# Patient Record
Sex: Male | Born: 1945 | Race: White | Hispanic: No | Marital: Married | Smoking: Never smoker
Health system: Southern US, Community
[De-identification: ages and names within clinical notes are randomized; demographics above are authoritative.]

## PROBLEM LIST (undated history)

## (undated) DIAGNOSIS — M170 Bilateral primary osteoarthritis of knee: Secondary | ICD-10-CM

## (undated) DIAGNOSIS — H6980 Other specified disorders of Eustachian tube, unspecified ear: Secondary | ICD-10-CM

## (undated) DIAGNOSIS — Z86718 Personal history of other venous thrombosis and embolism: Secondary | ICD-10-CM

## (undated) DIAGNOSIS — K210 Gastro-esophageal reflux disease with esophagitis: Secondary | ICD-10-CM

## (undated) DIAGNOSIS — G8929 Other chronic pain: Secondary | ICD-10-CM

## (undated) DIAGNOSIS — I10 Essential (primary) hypertension: Secondary | ICD-10-CM

## (undated) DIAGNOSIS — Z932 Ileostomy status: Secondary | ICD-10-CM

## (undated) DIAGNOSIS — M1990 Unspecified osteoarthritis, unspecified site: Secondary | ICD-10-CM

## (undated) DIAGNOSIS — I82409 Acute embolism and thrombosis of unspecified deep veins of unspecified lower extremity: Secondary | ICD-10-CM

## (undated) DIAGNOSIS — F419 Anxiety disorder, unspecified: Secondary | ICD-10-CM

## (undated) DIAGNOSIS — K435 Parastomal hernia without obstruction or  gangrene: Secondary | ICD-10-CM

## (undated) DIAGNOSIS — J9610 Chronic respiratory failure, unspecified whether with hypoxia or hypercapnia: Secondary | ICD-10-CM

## (undated) DIAGNOSIS — M7581 Other shoulder lesions, right shoulder: Secondary | ICD-10-CM

## (undated) DIAGNOSIS — G4730 Sleep apnea, unspecified: Secondary | ICD-10-CM

## (undated) DIAGNOSIS — N401 Enlarged prostate with lower urinary tract symptoms: Secondary | ICD-10-CM

## (undated) DIAGNOSIS — I5042 Chronic combined systolic (congestive) and diastolic (congestive) heart failure: Secondary | ICD-10-CM

## (undated) DIAGNOSIS — I255 Ischemic cardiomyopathy: Secondary | ICD-10-CM

## (undated) DIAGNOSIS — F4320 Adjustment disorder, unspecified: Secondary | ICD-10-CM

## (undated) DIAGNOSIS — R5381 Other malaise: Secondary | ICD-10-CM

## (undated) DIAGNOSIS — I219 Acute myocardial infarction, unspecified: Secondary | ICD-10-CM

## (undated) DIAGNOSIS — N138 Other obstructive and reflux uropathy: Secondary | ICD-10-CM

## (undated) DIAGNOSIS — Z951 Presence of aortocoronary bypass graft: Secondary | ICD-10-CM

## (undated) DIAGNOSIS — R7301 Impaired fasting glucose: Secondary | ICD-10-CM

## (undated) DIAGNOSIS — H6990 Unspecified Eustachian tube disorder, unspecified ear: Secondary | ICD-10-CM

## (undated) DIAGNOSIS — Z7901 Long term (current) use of anticoagulants: Secondary | ICD-10-CM

## (undated) DIAGNOSIS — Z789 Other specified health status: Secondary | ICD-10-CM

## (undated) DIAGNOSIS — L8990 Pressure ulcer of unspecified site, unspecified stage: Secondary | ICD-10-CM

## (undated) DIAGNOSIS — H409 Unspecified glaucoma: Secondary | ICD-10-CM

## (undated) DIAGNOSIS — I509 Heart failure, unspecified: Secondary | ICD-10-CM

## (undated) DIAGNOSIS — N529 Male erectile dysfunction, unspecified: Secondary | ICD-10-CM

## (undated) DIAGNOSIS — M479 Spondylosis, unspecified: Secondary | ICD-10-CM

## (undated) DIAGNOSIS — I252 Old myocardial infarction: Secondary | ICD-10-CM

## (undated) DIAGNOSIS — M67439 Ganglion, unspecified wrist: Secondary | ICD-10-CM

## (undated) DIAGNOSIS — K219 Gastro-esophageal reflux disease without esophagitis: Secondary | ICD-10-CM

## (undated) DIAGNOSIS — D638 Anemia in other chronic diseases classified elsewhere: Secondary | ICD-10-CM

## (undated) DIAGNOSIS — M545 Low back pain: Secondary | ICD-10-CM

## (undated) DIAGNOSIS — I2510 Atherosclerotic heart disease of native coronary artery without angina pectoris: Secondary | ICD-10-CM

## (undated) HISTORY — DX: Bilateral primary osteoarthritis of knee: M17.0

## (undated) HISTORY — DX: Adjustment disorder, unspecified: F43.20

## (undated) HISTORY — PX: EYE SURGERY: SHX253

## (undated) HISTORY — DX: Unspecified osteoarthritis, unspecified site: M19.90

## (undated) HISTORY — DX: Chronic respiratory failure, unspecified whether with hypoxia or hypercapnia: J96.10

## (undated) HISTORY — DX: Other specified disorders of Eustachian tube, unspecified ear: H69.80

## (undated) HISTORY — DX: Parastomal hernia without obstruction or gangrene: K43.5

## (undated) HISTORY — DX: Other obstructive and reflux uropathy: N13.8

## (undated) HISTORY — DX: Gastro-esophageal reflux disease without esophagitis: K21.9

## (undated) HISTORY — DX: Low back pain: M54.5

## (undated) HISTORY — DX: Other malaise: R53.81

## (undated) HISTORY — DX: Male erectile dysfunction, unspecified: N52.9

## (undated) HISTORY — DX: Pressure ulcer of unspecified site, unspecified stage: L89.90

## (undated) HISTORY — PX: HERNIA REPAIR: SHX51

## (undated) HISTORY — DX: Personal history of other venous thrombosis and embolism: Z86.718

## (undated) HISTORY — DX: Long term (current) use of anticoagulants: Z79.01

## (undated) HISTORY — DX: Ischemic cardiomyopathy: I25.5

## (undated) HISTORY — DX: Chronic combined systolic (congestive) and diastolic (congestive) heart failure: I50.42

## (undated) HISTORY — PX: WISDOM TOOTH EXTRACTION: SHX21

## (undated) HISTORY — DX: Ganglion, unspecified wrist: M67.439

## (undated) HISTORY — DX: Ileostomy status: Z93.2

## (undated) HISTORY — DX: Impaired fasting glucose: R73.01

## (undated) HISTORY — DX: Other specified health status: Z78.9

## (undated) HISTORY — DX: Unspecified eustachian tube disorder, unspecified ear: H69.90

## (undated) HISTORY — DX: Presence of aortocoronary bypass graft: Z95.1

## (undated) HISTORY — DX: Benign prostatic hyperplasia with lower urinary tract symptoms: N40.1

## (undated) HISTORY — PX: FASCIOTOMY FOOT / TOE: SUR603

## (undated) HISTORY — DX: Gastro-esophageal reflux disease with esophagitis: K21.0

## (undated) HISTORY — DX: Old myocardial infarction: I25.2

## (undated) HISTORY — DX: Other shoulder lesions, right shoulder: M75.81

## (undated) HISTORY — DX: Other chronic pain: G89.29

## (undated) HISTORY — DX: Unspecified glaucoma: H40.9

## (undated) HISTORY — DX: Morbid (severe) obesity due to excess calories: E66.01

## (undated) HISTORY — DX: Atherosclerotic heart disease of native coronary artery without angina pectoris: I25.10

## (undated) HISTORY — DX: Spondylosis, unspecified: M47.9

## (undated) HISTORY — DX: Essential (primary) hypertension: I10

## (undated) HISTORY — DX: Anemia in other chronic diseases classified elsewhere: D63.8

## (undated) HISTORY — DX: Acute embolism and thrombosis of unspecified deep veins of unspecified lower extremity: I82.409

---

## 1998-04-26 ENCOUNTER — Emergency Department (HOSPITAL_COMMUNITY): Admission: EM | Admit: 1998-04-26 | Discharge: 1998-04-26 | Payer: Self-pay | Admitting: Emergency Medicine

## 1999-01-12 ENCOUNTER — Ambulatory Visit: Admission: RE | Admit: 1999-01-12 | Discharge: 1999-01-12 | Payer: Self-pay | Admitting: Pulmonary Disease

## 1999-02-14 ENCOUNTER — Ambulatory Visit: Admission: RE | Admit: 1999-02-14 | Discharge: 1999-02-14 | Payer: Self-pay | Admitting: Pulmonary Disease

## 1999-10-22 ENCOUNTER — Ambulatory Visit (HOSPITAL_COMMUNITY): Admission: RE | Admit: 1999-10-22 | Discharge: 1999-10-22 | Payer: Self-pay | Admitting: Internal Medicine

## 2000-01-13 ENCOUNTER — Encounter: Payer: Self-pay | Admitting: Specialist

## 2000-01-13 ENCOUNTER — Ambulatory Visit (HOSPITAL_COMMUNITY): Admission: RE | Admit: 2000-01-13 | Discharge: 2000-01-13 | Payer: Self-pay | Admitting: Specialist

## 2000-01-25 ENCOUNTER — Encounter: Payer: Self-pay | Admitting: Internal Medicine

## 2000-01-25 ENCOUNTER — Ambulatory Visit (HOSPITAL_COMMUNITY): Admission: RE | Admit: 2000-01-25 | Discharge: 2000-01-25 | Payer: Self-pay | Admitting: Internal Medicine

## 2000-02-01 ENCOUNTER — Ambulatory Visit (HOSPITAL_COMMUNITY): Admission: RE | Admit: 2000-02-01 | Discharge: 2000-02-01 | Payer: Self-pay | Admitting: Specialist

## 2000-02-17 ENCOUNTER — Encounter: Payer: Self-pay | Admitting: Specialist

## 2000-02-17 ENCOUNTER — Ambulatory Visit (HOSPITAL_COMMUNITY): Admission: RE | Admit: 2000-02-17 | Discharge: 2000-02-17 | Payer: Self-pay | Admitting: Specialist

## 2000-03-03 ENCOUNTER — Ambulatory Visit (HOSPITAL_COMMUNITY): Admission: RE | Admit: 2000-03-03 | Discharge: 2000-03-03 | Payer: Self-pay | Admitting: Specialist

## 2000-03-03 ENCOUNTER — Encounter: Payer: Self-pay | Admitting: Specialist

## 2003-08-20 ENCOUNTER — Ambulatory Visit (HOSPITAL_COMMUNITY): Admission: RE | Admit: 2003-08-20 | Discharge: 2003-08-20 | Payer: Self-pay | Admitting: Internal Medicine

## 2013-09-04 DIAGNOSIS — I10 Essential (primary) hypertension: Secondary | ICD-10-CM

## 2013-09-04 HISTORY — DX: Essential (primary) hypertension: I10

## 2013-11-19 ENCOUNTER — Other Ambulatory Visit: Payer: Self-pay | Admitting: Orthopedic Surgery

## 2013-11-22 ENCOUNTER — Encounter (HOSPITAL_BASED_OUTPATIENT_CLINIC_OR_DEPARTMENT_OTHER): Payer: Self-pay | Admitting: *Deleted

## 2013-11-28 ENCOUNTER — Ambulatory Visit (HOSPITAL_BASED_OUTPATIENT_CLINIC_OR_DEPARTMENT_OTHER): Admission: RE | Admit: 2013-11-28 | Payer: Medicare Other | Source: Ambulatory Visit | Admitting: Orthopedic Surgery

## 2013-11-28 HISTORY — DX: Anxiety disorder, unspecified: F41.9

## 2013-11-28 HISTORY — DX: Essential (primary) hypertension: I10

## 2013-11-28 HISTORY — DX: Sleep apnea, unspecified: G47.30

## 2013-11-28 HISTORY — DX: Unspecified osteoarthritis, unspecified site: M19.90

## 2013-11-28 SURGERY — EXCISION MASS
Anesthesia: General | Laterality: Left

## 2013-12-04 DIAGNOSIS — R7301 Impaired fasting glucose: Secondary | ICD-10-CM

## 2013-12-04 HISTORY — DX: Impaired fasting glucose: R73.01

## 2013-12-05 DIAGNOSIS — G8929 Other chronic pain: Secondary | ICD-10-CM | POA: Insufficient documentation

## 2013-12-05 DIAGNOSIS — M170 Bilateral primary osteoarthritis of knee: Secondary | ICD-10-CM

## 2013-12-05 DIAGNOSIS — M479 Spondylosis, unspecified: Secondary | ICD-10-CM

## 2013-12-05 DIAGNOSIS — M545 Low back pain, unspecified: Secondary | ICD-10-CM

## 2013-12-05 HISTORY — DX: Low back pain, unspecified: M54.50

## 2013-12-05 HISTORY — DX: Other chronic pain: G89.29

## 2013-12-05 HISTORY — DX: Bilateral primary osteoarthritis of knee: M17.0

## 2013-12-05 HISTORY — DX: Spondylosis, unspecified: M47.9

## 2014-01-17 ENCOUNTER — Ambulatory Visit (INDEPENDENT_AMBULATORY_CARE_PROVIDER_SITE_OTHER): Payer: Medicare Other | Admitting: Podiatrist

## 2014-01-17 ENCOUNTER — Encounter: Payer: Self-pay | Admitting: Podiatrist

## 2014-01-17 VITALS — BP 111/62 | HR 50 | Resp 18

## 2014-01-17 DIAGNOSIS — L851 Acquired keratosis [keratoderma] palmaris et plantaris: Secondary | ICD-10-CM

## 2014-01-17 DIAGNOSIS — M216X9 Other acquired deformities of unspecified foot: Secondary | ICD-10-CM

## 2014-01-17 NOTE — Progress Notes (Signed)
   Subjective:    Patient ID: Jonathan Le, male    DOB: 11/29/1945, 68 y.o.   MRN: [id number]  HPI I have some hard places on the bottom of my right foot on the heel and been going on for about 2 weeks and hurts when I walk and I have some places on my left foot toward the sides    Review of Systems  Musculoskeletal: Positive for back pain.       Joint pain  All other systems reviewed and are negative.       Objective:   Physical Exam Vascular status intact with pedal pulses palpable at 2/4 dp and pt bilateral.  Neurological sensation intact protectively bilateral.  Prominent plantar flexed metatarsals are present bilateral with intractable hyperkeratotic lesions overlying on bilateral feet.  They have a ground glass appearance with intact integument underlying.       Assessment & Plan:  Prominent metatarsals, porokeratotic lesions  Plan:  Debrided the lesions, packed with salinocaine and applied moleskin.  Gave instructions for home care of the lesions and he will call if any problems arise or if further debridement is necessary.

## 2014-01-17 NOTE — Patient Instructions (Signed)
Purchase wart remover acid and when the areas become tender or sore dab a tiny amount of the acid in the middle of the lesion-- do this for 4 days.  Then soak your foot and the lesion should pull on out.  If it does not, let me know.  Be sure to watch for any redness or drainage around the lesions and if you see any of these signs, do not use the medication

## 2014-03-05 DIAGNOSIS — N529 Male erectile dysfunction, unspecified: Secondary | ICD-10-CM | POA: Insufficient documentation

## 2014-03-06 DIAGNOSIS — M67439 Ganglion, unspecified wrist: Secondary | ICD-10-CM

## 2014-03-06 DIAGNOSIS — H6980 Other specified disorders of Eustachian tube, unspecified ear: Secondary | ICD-10-CM | POA: Insufficient documentation

## 2014-03-06 DIAGNOSIS — F4320 Adjustment disorder, unspecified: Secondary | ICD-10-CM | POA: Insufficient documentation

## 2014-03-06 HISTORY — DX: Ganglion, unspecified wrist: M67.439

## 2014-03-14 DIAGNOSIS — Z789 Other specified health status: Secondary | ICD-10-CM

## 2014-03-14 DIAGNOSIS — IMO0001 Reserved for inherently not codable concepts without codable children: Secondary | ICD-10-CM

## 2014-03-14 HISTORY — DX: Reserved for inherently not codable concepts without codable children: IMO0001

## 2014-05-02 ENCOUNTER — Ambulatory Visit (INDEPENDENT_AMBULATORY_CARE_PROVIDER_SITE_OTHER): Payer: Medicare Other | Admitting: Podiatrist

## 2014-05-02 DIAGNOSIS — B351 Tinea unguium: Secondary | ICD-10-CM

## 2014-05-02 DIAGNOSIS — M79606 Pain in leg, unspecified: Secondary | ICD-10-CM

## 2014-05-02 DIAGNOSIS — M79609 Pain in unspecified limb: Secondary | ICD-10-CM

## 2014-05-02 NOTE — Progress Notes (Signed)
HPI:  Patient presents today for follow up of foot and nail care. Denies any new complaints today.  Objective:  Patients chart is reviewed.  Vascular status reveals pedal pulses noted at 2 out of 4 dp and pt bilateral .  Neurological sensation is Normal to [facility]il[facility] bilateral.  Patients nails are thickened, discolored, distrophic, friable and brittle with yellow-brown discoloration. Patient subjectively relates they are painful with shoes and with ambulation of bilateral feet. Small plantar calluses noted bilateral feet  Assessment:  Symptomatic onychomycosis , plantar calluses bilateral  Plan:  Discussed treatment options and alternatives.  The symptomatic toenails and calluses  were debrided through manual an mechanical means without complication.  Return appointment recommended at routine intervals of 3 months    Marlowe AschoffKathryn Egerton, DPM

## 2015-01-26 ENCOUNTER — Ambulatory Visit (INDEPENDENT_AMBULATORY_CARE_PROVIDER_SITE_OTHER): Payer: Medicare Other | Admitting: Podiatry

## 2015-01-26 ENCOUNTER — Encounter: Payer: Self-pay | Admitting: Podiatry

## 2015-01-26 VITALS — BP 123/57 | HR 59 | Resp 14

## 2015-01-26 DIAGNOSIS — L851 Acquired keratosis [keratoderma] palmaris et plantaris: Secondary | ICD-10-CM

## 2015-01-26 NOTE — Progress Notes (Signed)
   Subjective:    Patient ID: Jonathan Le, male    DOB: 08/05/1946, 69 y.o.   MRN: [id number]  HPI Left outside callous on side of foot.   Review of Systems  All other systems reviewed and are negative.      Objective:   Physical Exam        Assessment & Plan:

## 2015-01-28 NOTE — Progress Notes (Signed)
Subjective:     Patient ID: Jonathan HarpsHarold D Reininger, male   DOB: 12/14/1945, 69 y.o.   MRN: [id number]  HPI painful calluses on both feet are noted in this patient   Review of Systems     Objective:   Physical Exam Neurovascular status intact with thick keratotic lesions plantar aspect of both feet that are painful when pressed    Assessment:     Lesion secondary to pressure    Plan:     Debride lesions on both feet with no iatrogenic bleeding noted

## 2015-04-24 DIAGNOSIS — N401 Enlarged prostate with lower urinary tract symptoms: Secondary | ICD-10-CM

## 2015-04-24 DIAGNOSIS — N138 Other obstructive and reflux uropathy: Secondary | ICD-10-CM | POA: Insufficient documentation

## 2015-04-24 HISTORY — DX: Benign prostatic hyperplasia with lower urinary tract symptoms: N13.8

## 2015-05-19 ENCOUNTER — Inpatient Hospital Stay (HOSPITAL_COMMUNITY)
Admission: EM | Admit: 2015-05-19 | Discharge: 2015-05-31 | DRG: 233 | Disposition: A | Discharge status: Home Health | Payer: Medicare Other | Attending: Cardiothoracic Surgery | Admitting: Cardiothoracic Surgery

## 2015-05-19 ENCOUNTER — Emergency Department (HOSPITAL_COMMUNITY): Payer: Medicare Other

## 2015-05-19 ENCOUNTER — Encounter (HOSPITAL_COMMUNITY): Payer: Self-pay | Admitting: Emergency Medicine

## 2015-05-19 DIAGNOSIS — Z880 Allergy status to penicillin: Secondary | ICD-10-CM | POA: Diagnosis not present

## 2015-05-19 DIAGNOSIS — Z6841 Body Mass Index (BMI) 40.0 and over, adult: Secondary | ICD-10-CM

## 2015-05-19 DIAGNOSIS — T82898A Other specified complication of vascular prosthetic devices, implants and grafts, initial encounter: Secondary | ICD-10-CM

## 2015-05-19 DIAGNOSIS — F419 Anxiety disorder, unspecified: Secondary | ICD-10-CM | POA: Diagnosis present

## 2015-05-19 DIAGNOSIS — Z951 Presence of aortocoronary bypass graft: Secondary | ICD-10-CM

## 2015-05-19 DIAGNOSIS — M1990 Unspecified osteoarthritis, unspecified site: Secondary | ICD-10-CM | POA: Diagnosis present

## 2015-05-19 DIAGNOSIS — I5021 Acute systolic (congestive) heart failure: Secondary | ICD-10-CM | POA: Diagnosis present

## 2015-05-19 DIAGNOSIS — Z791 Long term (current) use of non-steroidal anti-inflammatories (NSAID): Secondary | ICD-10-CM

## 2015-05-19 DIAGNOSIS — Z9841 Cataract extraction status, right eye: Secondary | ICD-10-CM | POA: Diagnosis not present

## 2015-05-19 DIAGNOSIS — Z9842 Cataract extraction status, left eye: Secondary | ICD-10-CM | POA: Diagnosis not present

## 2015-05-19 DIAGNOSIS — G8929 Other chronic pain: Secondary | ICD-10-CM | POA: Diagnosis present

## 2015-05-19 DIAGNOSIS — Z79899 Other long term (current) drug therapy: Secondary | ICD-10-CM

## 2015-05-19 DIAGNOSIS — I10 Essential (primary) hypertension: Secondary | ICD-10-CM | POA: Diagnosis present

## 2015-05-19 DIAGNOSIS — I214 Non-ST elevation (NSTEMI) myocardial infarction: Principal | ICD-10-CM

## 2015-05-19 DIAGNOSIS — I255 Ischemic cardiomyopathy: Secondary | ICD-10-CM | POA: Diagnosis present

## 2015-05-19 DIAGNOSIS — I2510 Atherosclerotic heart disease of native coronary artery without angina pectoris: Secondary | ICD-10-CM

## 2015-05-19 DIAGNOSIS — G4733 Obstructive sleep apnea (adult) (pediatric): Secondary | ICD-10-CM | POA: Diagnosis present

## 2015-05-19 DIAGNOSIS — I9589 Other hypotension: Secondary | ICD-10-CM | POA: Diagnosis present

## 2015-05-19 DIAGNOSIS — R0602 Shortness of breath: Secondary | ICD-10-CM

## 2015-05-19 DIAGNOSIS — I481 Persistent atrial fibrillation: Secondary | ICD-10-CM | POA: Diagnosis not present

## 2015-05-19 DIAGNOSIS — N320 Bladder-neck obstruction: Secondary | ICD-10-CM | POA: Diagnosis not present

## 2015-05-19 DIAGNOSIS — I4891 Unspecified atrial fibrillation: Secondary | ICD-10-CM | POA: Diagnosis not present

## 2015-05-19 DIAGNOSIS — N400 Enlarged prostate without lower urinary tract symptoms: Secondary | ICD-10-CM | POA: Diagnosis present

## 2015-05-19 DIAGNOSIS — I25110 Atherosclerotic heart disease of native coronary artery with unstable angina pectoris: Secondary | ICD-10-CM | POA: Diagnosis present

## 2015-05-19 DIAGNOSIS — R0600 Dyspnea, unspecified: Secondary | ICD-10-CM | POA: Diagnosis not present

## 2015-05-19 DIAGNOSIS — I447 Left bundle-branch block, unspecified: Secondary | ICD-10-CM | POA: Diagnosis present

## 2015-05-19 LAB — URINALYSIS, ROUTINE W REFLEX MICROSCOPIC
GLUCOSE, UA: NEGATIVE mg/dL
Hgb urine dipstick: NEGATIVE
KETONES UR: NEGATIVE mg/dL
Nitrite: NEGATIVE
Protein, ur: 30 mg/dL — AB
Specific Gravity, Urine: 1.022 (ref 1.005–1.030)
Urobilinogen, UA: 4 mg/dL — ABNORMAL HIGH (ref 0.0–1.0)
pH: 6.5 (ref 5.0–8.0)

## 2015-05-19 LAB — COMPREHENSIVE METABOLIC PANEL
ALBUMIN: 3.4 g/dL — AB (ref 3.5–5.0)
ALT: 40 U/L (ref 17–63)
AST: 112 U/L — AB (ref 15–41)
Alkaline Phosphatase: 69 U/L (ref 38–126)
Anion gap: 10 (ref 5–15)
BILIRUBIN TOTAL: 1 mg/dL (ref 0.3–1.2)
BUN: 21 mg/dL — AB (ref 6–20)
CALCIUM: 9 mg/dL (ref 8.9–10.3)
CO2: 26 mmol/L (ref 22–32)
CREATININE: 0.8 mg/dL (ref 0.61–1.24)
Chloride: 98 mmol/L — ABNORMAL LOW (ref 101–111)
GFR calc non Af Amer: >60 mL/min (ref >60)
Glucose, Bld: 138 mg/dL — ABNORMAL HIGH (ref 65–99)
Potassium: 3 mmol/L — ABNORMAL LOW (ref 3.5–5.1)
Sodium: 134 mmol/L — ABNORMAL LOW (ref 135–145)
TOTAL PROTEIN: 6.4 g/dL — AB (ref 6.5–8.1)

## 2015-05-19 LAB — I-STAT TROPONIN, ED: Troponin i, poc: 30.94 ng/mL (ref 0.00–0.08)

## 2015-05-19 LAB — CBC
HCT: 43.4 % (ref 39.0–52.0)
Hemoglobin: 15.3 g/dL (ref 13.0–17.0)
MCH: 29.8 pg (ref 26.0–34.0)
MCHC: 35.3 g/dL (ref 30.0–36.0)
MCV: 84.4 fL (ref 78.0–100.0)
PLATELETS: 208 10*3/uL (ref 150–400)
RBC: 5.14 MIL/uL (ref 4.22–5.81)
RDW: 13.4 % (ref 11.5–15.5)
WBC: 12.5 10*3/uL — AB (ref 4.0–10.5)

## 2015-05-19 LAB — PROTIME-INR
INR: 1.07 (ref 0.00–1.49)
Prothrombin Time: 14.1 seconds (ref 11.6–15.2)

## 2015-05-19 LAB — URINE MICROSCOPIC-ADD ON

## 2015-05-19 LAB — TROPONIN I: Troponin I: 30.63 ng/mL (ref <0.031)

## 2015-05-19 LAB — BRAIN NATRIURETIC PEPTIDE: B NATRIURETIC PEPTIDE 5: 324.9 pg/mL — AB (ref 0.0–100.0)

## 2015-05-19 LAB — APTT: APTT: 28 s (ref 24–37)

## 2015-05-19 LAB — LIPASE, BLOOD: Lipase: 15 U/L — ABNORMAL LOW (ref 22–51)

## 2015-05-19 MED ORDER — PANTOPRAZOLE SODIUM 40 MG IV SOLR
40.0000 mg | Freq: Once | INTRAVENOUS | Status: AC
  Administered 2015-05-19: 40 mg via INTRAVENOUS
  Filled 2015-05-19: qty 40

## 2015-05-19 MED ORDER — POTASSIUM CHLORIDE 20 MEQ/15ML (10%) PO SOLN
40.0000 meq | Freq: Once | ORAL | Status: AC
  Administered 2015-05-19: 40 meq via ORAL
  Filled 2015-05-19: qty 30

## 2015-05-19 MED ORDER — HEPARIN (PORCINE) IN NACL 100-0.45 UNIT/ML-% IJ SOLN
1550.0000 [IU]/h | INTRAMUSCULAR | Status: DC
  Administered 2015-05-19: 1250 [IU]/h via INTRAVENOUS
  Filled 2015-05-19 (×2): qty 250

## 2015-05-19 MED ORDER — HEPARIN BOLUS VIA INFUSION
4000.0000 [IU] | Freq: Once | INTRAVENOUS | Status: AC
  Administered 2015-05-19: 4000 [IU] via INTRAVENOUS
  Filled 2015-05-19: qty 4000

## 2015-05-19 MED ORDER — ASPIRIN 325 MG PO TABS
325.0000 mg | ORAL_TABLET | Freq: Once | ORAL | Status: AC
  Administered 2015-05-19: 325 mg via ORAL
  Filled 2015-05-19: qty 1

## 2015-05-19 NOTE — ED Notes (Signed)
Informed Dr. Donnald GarrePfeiffer of troponin of 30.94 @ 2157

## 2015-05-19 NOTE — ED Provider Notes (Signed)
CSN: [id number]     Arrival date & time 05/19/15  2049 History   First MD Initiated Contact with Patient 05/19/15 2115     Chief Complaint  Patient presents with  . Chest Pain     (Consider location/radiation/quality/duration/timing/severity/associated sxs/prior Treatment) HPI Patient states on Saturday, 3 nights ago, he developed a sudden sharp pain in his left shoulder. He reports that he also has some tingling sensation in his left arm. He thought it might be gas so he went out and got a large soda, he drank that and that did cause some burping. The pain did seem to ease. Since that time he has had some general fatigue and loss of appetite.Marland Kitchen He reports he's also had exertional shortness of breath. He is also noting that he is breaking out in a sweat and getting nauseated periodically. He has been experiencing early satiety. He has not had a fever. Has had some headaches of been waxing and waning. No vomiting or diarrhea. Today his wife noted a red patch on his back, otherwise he has not noted any fevers. Past Medical History  Diagnosis Date  . Anxiety   . Hypertension   . Arthritis   . Sleep apnea     wears CPAP nightly  . Glaucoma    Past Surgical History  Procedure Laterality Date  . Hernia repair      right  . Eye surgery      bil cataract  . Wisdom tooth extraction    . Fasciotomy foot / toe Left     foot  . Cardiac catheterization N/A 05/20/2015    Procedure: Left Heart Cath and Coronary Angiography;  Surgeon: Kathleene Hazel, MD;  Location: [facility] INVASIVE CV LAB;  Service: Cardiovascular;  Laterality: N/A;   History reviewed. No pertinent family history. History  Substance Use Topics  . Smoking status: Never Smoker   . Smokeless tobacco: Never Used  . Alcohol Use: No    Review of Systems  10 Systems reviewed and are negative for acute change except as noted in the HPI.  Allergies  Penicillins  Home Medications   Prior to Admission medications   Medication  Sig Start Date End Date Taking? Authorizing Provider  acetaminophen (TYLENOL) 500 MG tablet Take 1,500 mg by mouth every 6 (six) hours as needed for headache.   Yes Historical Provider, MD  bisoprolol (ZEBETA) 5 MG tablet Take 1.25 mg by mouth daily.    Yes Historical Provider, MD  chlorthalidone (HYGROTON) 50 MG tablet Take 50 mg by mouth daily.    Yes Historical Provider, MD  finasteride (PROSCAR) 5 MG tablet Take 5 mg by mouth daily.   Yes Historical Provider, MD  furosemide (LASIX) 40 MG tablet Take 40 mg by mouth daily as needed for fluid.   Yes Historical Provider, MD  lisinopril (PRINIVIL,ZESTRIL) 40 MG tablet Take 40 mg by mouth daily.   Yes Historical Provider, MD  Misc Natural Products (PHYTOCILLIN) CAPS Take 1 capsule by mouth daily.   Yes Historical Provider, MD  naproxen (NAPROSYN) 500 MG tablet Take 500 mg by mouth 2 (two) times daily with a meal.   Yes Historical Provider, MD   BP 104/65 mmHg  Pulse 60  Temp(Src) 99 F (37.2 C) (Oral)  Resp 20  Ht 5\' 9"  (1.753 m)  Wt 314 lb 9.5 oz (142.7 kg)  BMI 46.44 kg/m2  SpO2 97% Physical Exam  Constitutional: He is oriented to person, place, and time.  Patient is morbidly obese.  He is alert and nontoxic. No acute respiratory distress. Color is good.  HENT:  Head: Normocephalic and atraumatic.  Right Ear: External ear normal.  Left Ear: External ear normal.  Nose: Nose normal.  Mouth/Throat: Oropharynx is clear and moist. No oropharyngeal exudate.  Eyes: EOM are normal. Pupils are equal, round, and reactive to light. No scleral icterus.  Neck: Neck supple.  Cardiovascular:  Heart is regular with occasional ectopic beats. No gross rub murmur or gallop.  Pulmonary/Chest: Effort normal and breath sounds normal. No respiratory distress.  Abdominal: Soft. Bowel sounds are normal. He exhibits no distension. There is no tenderness. There is no rebound and no guarding.  Musculoskeletal:  1+ pitting edema bilateral lower extremities. No  calf tenderness.  Neurological: He is alert and oriented to person, place, and time. No cranial nerve deficit. He exhibits normal muscle tone. Coordination normal.  Skin: Skin is warm and dry.  Is a 5 x 3 cm patch in the center of the patient's lumbar back. It has a slightly raised erythematous rim and a central clearing. See attached image.  Psychiatric: He has a normal mood and affect.    ED Course  Procedures (including critical care time) Labs Review Labs Reviewed  CBC - Abnormal; Notable for the following:    WBC 12.5 (*)    All other components within normal limits  BRAIN NATRIURETIC PEPTIDE - Abnormal; Notable for the following:    B Natriuretic Peptide 324.9 (*)    All other components within normal limits  COMPREHENSIVE METABOLIC PANEL - Abnormal; Notable for the following:    Sodium 134 (*)    Potassium 3.0 (*)    Chloride 98 (*)    Glucose, Bld 138 (*)    BUN 21 (*)    Total Protein 6.4 (*)    Albumin 3.4 (*)    AST 112 (*)    All other components within normal limits  LIPASE, BLOOD - Abnormal; Notable for the following:    Lipase 15 (*)    All other components within normal limits  URINALYSIS, ROUTINE W REFLEX MICROSCOPIC (NOT AT [facility]) - Abnormal; Notable for the following:    Color, Urine ORANGE (*)    APPearance CLOUDY (*)    Bilirubin Urine MODERATE (*)    Protein, ur 30 (*)    Urobilinogen, UA 4.0 (*)    Leukocytes, UA SMALL (*)    All other components within normal limits  TROPONIN I - Abnormal; Notable for the following:    Troponin I 30.63 (*)    All other components within normal limits  URINE MICROSCOPIC-ADD ON - Abnormal; Notable for the following:    Casts HYALINE CASTS (*)    All other components within normal limits  COMPREHENSIVE METABOLIC PANEL - Abnormal; Notable for the following:    Potassium 3.2 (*)    Chloride 99 (*)    Glucose, Bld 128 (*)    Total Protein 6.2 (*)    Albumin 2.9 (*)    AST 105 (*)    All other components within  normal limits  TROPONIN I - Abnormal; Notable for the following:    Troponin I 33.66 (*)    All other components within normal limits  TROPONIN I - Abnormal; Notable for the following:    Troponin I 25.08 (*)    All other components within normal limits  TROPONIN I - Abnormal; Notable for the following:    Troponin I 19.24 (*)    All other components  within normal limits  HEPARIN LEVEL (UNFRACTIONATED) - Abnormal; Notable for the following:    Heparin Unfractionated <0.10 (*)    All other components within normal limits  CBC - Abnormal; Notable for the following:    WBC 10.7 (*)    All other components within normal limits  LIPID PANEL - Abnormal; Notable for the following:    HDL 40 (*)    All other components within normal limits  HEPARIN LEVEL (UNFRACTIONATED) - Abnormal; Notable for the following:    Heparin Unfractionated <0.10 (*)    All other components within normal limits  CBC - Abnormal; Notable for the following:    WBC 10.7 (*)    All other components within normal limits  BASIC METABOLIC PANEL - Abnormal; Notable for the following:    Potassium 3.3 (*)    Glucose, Bld 123 (*)    Creatinine, Ser 0.58 (*)    All other components within normal limits  URINALYSIS, ROUTINE W REFLEX MICROSCOPIC (NOT AT [facility]) - Abnormal; Notable for the following:    Color, Urine AMBER (*)    Specific Gravity, Urine 1.043 (*)    Bilirubin Urine SMALL (*)    Urobilinogen, UA 2.0 (*)    All other components within normal limits  HEMOGLOBIN A1C - Abnormal; Notable for the following:    Hgb A1c MFr Bld 5.8 (*)    All other components within normal limits  HEPARIN LEVEL (UNFRACTIONATED) - Abnormal; Notable for the following:    Heparin Unfractionated >2.20 (*)    All other components within normal limits  HEPARIN LEVEL (UNFRACTIONATED) - Abnormal; Notable for the following:    Heparin Unfractionated 0.22 (*)    All other components within normal limits  HEPARIN LEVEL (UNFRACTIONATED) -  Abnormal; Notable for the following:    Heparin Unfractionated <0.10 (*)    All other components within normal limits  HEMOGLOBIN A1C - Abnormal; Notable for the following:    Hgb A1c MFr Bld 6.0 (*)    All other components within normal limits  BASIC METABOLIC PANEL - Abnormal; Notable for the following:    Potassium 3.1 (*)    Glucose, Bld 116 (*)    Creatinine, Ser 0.50 (*)    All other components within normal limits  BLOOD GAS, ARTERIAL - Abnormal; Notable for the following:    pH, Arterial 7.458 (*)    pO2, Arterial 66.0 (*)    Bicarbonate 26.3 (*)    Acid-Base Excess 2.7 (*)    All other components within normal limits  HEMOGLOBIN AND HEMATOCRIT, BLOOD - Abnormal; Notable for the following:    Hemoglobin 11.7 (*)    HCT 33.7 (*)    All other components within normal limits  CBC - Abnormal; Notable for the following:    WBC 17.6 (*)    All other components within normal limits  PROTIME-INR - Abnormal; Notable for the following:    Prothrombin Time 18.0 (*)    All other components within normal limits  CBC - Abnormal; Notable for the following:    WBC 12.8 (*)    HCT 38.2 (*)    All other components within normal limits  CREATININE, SERUM - Abnormal; Notable for the following:    Creatinine, Ser 0.52 (*)    All other components within normal limits  MAGNESIUM - Abnormal; Notable for the following:    Magnesium 2.5 (*)    All other components within normal limits  CBC - Abnormal; Notable for the following:  WBC 11.6 (*)    RBC 4.16 (*)    Hemoglobin 12.0 (*)    HCT 35.2 (*)    All other components within normal limits  BASIC METABOLIC PANEL - Abnormal; Notable for the following:    Glucose, Bld 145 (*)    Creatinine, Ser 0.50 (*)    Calcium 8.1 (*)    All other components within normal limits  GLUCOSE, CAPILLARY - Abnormal; Notable for the following:    Glucose-Capillary 135 (*)    All other components within normal limits  GLUCOSE, CAPILLARY - Abnormal;  Notable for the following:    Glucose-Capillary 122 (*)    All other components within normal limits  GLUCOSE, CAPILLARY - Abnormal; Notable for the following:    Glucose-Capillary 115 (*)    All other components within normal limits  GLUCOSE, CAPILLARY - Abnormal; Notable for the following:    Glucose-Capillary 129 (*)    All other components within normal limits  GLUCOSE, CAPILLARY - Abnormal; Notable for the following:    Glucose-Capillary 131 (*)    All other components within normal limits  CREATININE, SERUM - Abnormal; Notable for the following:    Creatinine, Ser 0.60 (*)    All other components within normal limits  GLUCOSE, CAPILLARY - Abnormal; Notable for the following:    Glucose-Capillary 137 (*)    All other components within normal limits  GLUCOSE, CAPILLARY - Abnormal; Notable for the following:    Glucose-Capillary 163 (*)    All other components within normal limits  GLUCOSE, CAPILLARY - Abnormal; Notable for the following:    Glucose-Capillary 140 (*)    All other components within normal limits  GLUCOSE, CAPILLARY - Abnormal; Notable for the following:    Glucose-Capillary 116 (*)    All other components within normal limits  GLUCOSE, CAPILLARY - Abnormal; Notable for the following:    Glucose-Capillary 109 (*)    All other components within normal limits  GLUCOSE, CAPILLARY - Abnormal; Notable for the following:    Glucose-Capillary 110 (*)    All other components within normal limits  GLUCOSE, CAPILLARY - Abnormal; Notable for the following:    Glucose-Capillary 112 (*)    All other components within normal limits  GLUCOSE, CAPILLARY - Abnormal; Notable for the following:    Glucose-Capillary 112 (*)    All other components within normal limits  GLUCOSE, CAPILLARY - Abnormal; Notable for the following:    Glucose-Capillary 143 (*)    All other components within normal limits  BASIC METABOLIC PANEL - Abnormal; Notable for the following:    Sodium 131  (*)    Chloride 98 (*)    Glucose, Bld 164 (*)    Creatinine, Ser 0.54 (*)    Calcium 8.3 (*)    All other components within normal limits  CBC - Abnormal; Notable for the following:    WBC 13.1 (*)    RBC 4.07 (*)    Hemoglobin 12.0 (*)    HCT 35.2 (*)    Platelets 119 (*)    All other components within normal limits  GLUCOSE, CAPILLARY - Abnormal; Notable for the following:    Glucose-Capillary 121 (*)    All other components within normal limits  GLUCOSE, CAPILLARY - Abnormal; Notable for the following:    Glucose-Capillary 151 (*)    All other components within normal limits  CBC - Abnormal; Notable for the following:    WBC 12.6 (*)    Hemoglobin 12.8 (*)  HCT 37.3 (*)    All other components within normal limits  GLUCOSE, CAPILLARY - Abnormal; Notable for the following:    Glucose-Capillary 138 (*)    All other components within normal limits  GLUCOSE, CAPILLARY - Abnormal; Notable for the following:    Glucose-Capillary 127 (*)    All other components within normal limits  GLUCOSE, CAPILLARY - Abnormal; Notable for the following:    Glucose-Capillary 119 (*)    All other components within normal limits  GLUCOSE, CAPILLARY - Abnormal; Notable for the following:    Glucose-Capillary 115 (*)    All other components within normal limits  TSH - Abnormal; Notable for the following:    TSH 0.212 (*)    All other components within normal limits  HEPATIC FUNCTION PANEL - Abnormal; Notable for the following:    Total Protein 5.1 (*)    Albumin 2.4 (*)    All other components within normal limits  GLUCOSE, CAPILLARY - Abnormal; Notable for the following:    Glucose-Capillary 198 (*)    All other components within normal limits  GLUCOSE, CAPILLARY - Abnormal; Notable for the following:    Glucose-Capillary 127 (*)    All other components within normal limits  BASIC METABOLIC PANEL - Abnormal; Notable for the following:    Sodium 129 (*)    Chloride 94 (*)    Glucose,  Bld 261 (*)    Creatinine, Ser 0.50 (*)    Calcium 8.5 (*)    All other components within normal limits  CBC - Abnormal; Notable for the following:    WBC 16.7 (*)    RBC 4.18 (*)    Hemoglobin 12.3 (*)    HCT 36.1 (*)    All other components within normal limits  BASIC METABOLIC PANEL - Abnormal; Notable for the following:    Sodium 130 (*)    Chloride 97 (*)    Glucose, Bld 189 (*)    Calcium 8.5 (*)    All other components within normal limits  CBC - Abnormal; Notable for the following:    WBC 13.0 (*)    RBC 4.11 (*)    Hemoglobin 12.0 (*)    HCT 35.6 (*)    All other components within normal limits  GLUCOSE, CAPILLARY - Abnormal; Notable for the following:    Glucose-Capillary 137 (*)    All other components within normal limits  GLUCOSE, CAPILLARY - Abnormal; Notable for the following:    Glucose-Capillary 125 (*)    All other components within normal limits  GLUCOSE, CAPILLARY - Abnormal; Notable for the following:    Glucose-Capillary 235 (*)    All other components within normal limits  CARBOXYHEMOGLOBIN - Abnormal; Notable for the following:    Total hemoglobin 12.4 (*)    All other components within normal limits  GLUCOSE, CAPILLARY - Abnormal; Notable for the following:    Glucose-Capillary 111 (*)    All other components within normal limits  I-STAT TROPOININ, ED - Abnormal; Notable for the following:    Troponin i, poc 30.94 (*)    All other components within normal limits  POCT I-STAT, CHEM 8 - Abnormal; Notable for the following:    Chloride 99 (*)    Creatinine, Ser 0.60 (*)    Glucose, Bld 154 (*)    All other components within normal limits  POCT I-STAT, CHEM 8 - Abnormal; Notable for the following:    Potassium 3.4 (*)    Chloride 100 (*)  Creatinine, Ser 0.50 (*)    Glucose, Bld 152 (*)    All other components within normal limits  POCT I-STAT 3, ART BLOOD GAS (G3+) - Abnormal; Notable for the following:    pCO2 arterial 45.3 (*)    pO2,  Arterial 310.0 (*)    Bicarbonate 27.1 (*)    All other components within normal limits  POCT I-STAT GLUCOSE - Abnormal; Notable for the following:    Glucose, Bld 148 (*)    All other components within normal limits  POCT I-STAT, CHEM 8 - Abnormal; Notable for the following:    Chloride 100 (*)    Creatinine, Ser 0.40 (*)    Glucose, Bld 153 (*)    Hemoglobin 11.6 (*)    HCT 34.0 (*)    All other components within normal limits  POCT I-STAT, CHEM 8 - Abnormal; Notable for the following:    Chloride 100 (*)    Creatinine, Ser 0.50 (*)    Glucose, Bld 188 (*)    Calcium, Ion 1.03 (*)    Hemoglobin 11.2 (*)    HCT 33.0 (*)    All other components within normal limits  POCT I-STAT 3, ART BLOOD GAS (G3+) - Abnormal; Notable for the following:    pO2, Arterial 237.0 (*)    Bicarbonate 25.7 (*)    All other components within normal limits  POCT I-STAT 3, ART BLOOD GAS (G3+) - Abnormal; Notable for the following:    pH, Arterial 7.340 (*)    pCO2 arterial 47.5 (*)    pO2, Arterial 49.0 (*)    Bicarbonate 26.0 (*)    All other components within normal limits  POCT I-STAT 4, (NA,K, GLUC, HGB,HCT) - Abnormal; Notable for the following:    Potassium 3.4 (*)    Glucose, Bld 164 (*)    All other components within normal limits  POCT I-STAT, CHEM 8 - Abnormal; Notable for the following:    Creatinine, Ser 0.40 (*)    Glucose, Bld 153 (*)    Hemoglobin 12.9 (*)    HCT 38.0 (*)    All other components within normal limits  POCT I-STAT 3, ART BLOOD GAS (G3+) - Abnormal; Notable for the following:    pO2, Arterial 61.0 (*)    Bicarbonate 24.2 (*)    All other components within normal limits  POCT I-STAT 3, ART BLOOD GAS (G3+) - Abnormal; Notable for the following:    pO2, Arterial 63.0 (*)    All other components within normal limits  POCT I-STAT 3, ART BLOOD GAS (G3+) - Abnormal; Notable for the following:    pO2, Arterial 64.0 (*)    All other components within normal limits  POCT  I-STAT, CHEM 8 - Abnormal; Notable for the following:    Sodium 134 (*)    Chloride 99 (*)    Creatinine, Ser 0.40 (*)    Glucose, Bld 156 (*)    Hemoglobin 12.2 (*)    HCT 36.0 (*)    All other components within normal limits  MRSA PCR SCREENING  SURGICAL PCR SCREEN  PROTIME-INR  B. BURGDORFI ANTIBODIES  APTT  TSH  CBC  PLATELET COUNT  APTT  MAGNESIUM  MAGNESIUM  GLUCOSE, CAPILLARY  GLUCOSE, CAPILLARY  GLUCOSE, CAPILLARY  MAGNESIUM  MAGNESIUM  TYPE AND SCREEN  PREPARE RBC (CROSSMATCH)  ABO/RH  PREPARE FRESH FROZEN PLASMA    Imaging Review Dg Chest Port 1 View  05/25/2015   CLINICAL DATA:  CABG.  EXAM:  PORTABLE CHEST - 1 VIEW  COMPARISON:  05/24/2015 .  FINDINGS: Interim removal of left chest tube. Right IJ sheath in stable position. Prior CABG. Stable severe cardiomegaly. Interim clearing of pulmonary venous congestion. Left lower lobe atelectasis and or infiltrate. No pleural effusion or pneumothorax.  IMPRESSION: 1. Interim removal of left chest tube.  No pneumothorax.  2.  Left lower lobe atelectasis and/or infiltrate.  3. Prior CABG. Persistent severe cardiomegaly. Interim clearing of pulmonary venous congestion.   Electronically Signed   By: Maisie Fus  Register   On: 05/25/2015 07:32   Dg Chest Port 1 View  05/24/2015   CLINICAL DATA:  Postop from CABG. Ischemic cardiomyopathy. Acute systolic congestive heart failure.  EXAM: PORTABLE CHEST - 1 VIEW  COMPARISON:  05/23/2015  FINDINGS: Swan-Ganz catheter has been removed. Left chest tube remains in place as well as right jugular Cordis. No pneumothorax visualized.  Low lung volumes and bibasilar atelectasis again demonstrated. Cardiomegaly and pulmonary vascular congestion are also stable. No new or worsening areas of pulmonary opacity identified.  IMPRESSION: No significant change in low lung volumes and bibasilar atelectasis. Stable cardiomegaly and pulmonary vascular congestion.   Electronically Signed   By: Myles Rosenthal M.D.    On: 05/24/2015 07:56     EKG Interpretation   Date/Time:  Tuesday May 19 2015 21:10:17 EDT Ventricular Rate:  79 PR Interval:  177 QRS Duration: 111 QT Interval:  349 QTC Calculation: 400 R Axis:   -67 Text Interpretation:  Sinus rhythm Inferior infarct, old Abnormal lateral  Q waves Anterior infarct, old Baseline wander in lead(s) II III aVR aVL  aVF V5 V6 ED PHYSICIAN INTERPRETATION AVAILABLE IN [facility]  Confirmed by TEST, Record ([id number]) on 05/20/2015 6:57:04 AM     CRITICAL CARE Performed by: Arby Barrette   Total critical care time: 45  Critical care time was exclusive of separately billable procedures and treating other patients.  Critical care was necessary to treat or prevent imminent or life-threatening deterioration.  Critical care was time spent personally by me on the following activities: development of treatment plan with patient and/or surrogate as well as nursing, discussions with consultants, evaluation of patient's response to treatment, examination of patient, obtaining history from patient or surrogate, ordering and performing treatments and interventions, ordering and review of laboratory studies, ordering and review of radiographic studies, pulse oximetry and re-evaluation of patient's condition. MDM   Final diagnoses:  NSTEMI (non-ST elevated myocardial infarction)   The patient presents on above with a shoulder pain 3 days ago and then subsequent nausea, early saeity and dyspnea. Troponin is confirmatory of MI. EKG does not have ST elevations. Cardiology was consults it and the patient will be transferred to [facility] for admission. Patient is started on heparin in the emergency department. His mental status is clear, his respiratory status is stable. He does not have ST elevation EKG.    Arby Barrette, MD 05/25/15 [phone]

## 2015-05-19 NOTE — ED Notes (Signed)
Pt complains of a sharp pain in his back and tingling in his left arm Saturday night, this has since subsided, now he complains of centralized shortness of breath and chest pains, he also randomly breaks out sweating ans gets nauseated.

## 2015-05-19 NOTE — H&P (Signed)
History and Physical   Admit date: 05/19/2015 Name:  Jonathan Le Medical record number: [id number] DOB/Age:  12-11-1945  69 y.o. male  Referring Physician:  [facility]  Primary Cardiologist: Gentry Fitz   Primary Physician:   Cherylann Ratel, FNP   Chief complaint/reason for admission: Recent chest pain, diaphoresis and shortness of breath  HPI:  This 69 year old male has a history of hypertension, sleep apnea and morbid obesity.  He developed sharp chest pain in his back and tingling of his left arm Saturday night and some shoulder pain.  He thought it might be gas and got a large soda drank that which caused some burping and some relief of his pain.  Since then he had generalized fatigue loss of appetite and developed exertional shortness of breath.  He developed sweating and nausea on an intermittent basis with early satiety.  He was out eating with his wife this evening in when he pushed the plate back and said he could not eat anymore and because of the shortness of breath wife wanted to bring him to the emergency room.  He was complaining of mild headache at that time.  He was brought to the emergency room at [facility] long and a troponin returned positive at 30.  His EKG showed a left bundle branch block type pattern but with poor R-wave progression there was a question of ST elevation in leads 1 and aVL in the presence of a left bundle.   Past Medical History  Diagnosis Date  . Anxiety   . Hypertension   . Arthritis   . Sleep apnea     wears CPAP nightly  . Glaucoma      Past Surgical History  Procedure Laterality Date  . Hernia repair      right  . Eye surgery      bil cataract  . Wisdom tooth extraction    . Fasciotomy foot / toe Left     foot   Allergies: is allergic to penicillins.   Medications: Prior to Admission medications   Medication Sig Start Date End Date Taking? Authorizing Provider  acetaminophen (TYLENOL) 500 MG tablet Take 1,500 mg by  mouth every 6 (six) hours as needed for headache.   Yes Historical Provider, MD  bisoprolol (ZEBETA) 5 MG tablet Take 1.25 mg by mouth daily.    Yes Historical Provider, MD  chlorthalidone (HYGROTON) 50 MG tablet Take 50 mg by mouth daily.    Yes Historical Provider, MD  finasteride (PROSCAR) 5 MG tablet Take 5 mg by mouth daily.   Yes Historical Provider, MD  furosemide (LASIX) 40 MG tablet Take 40 mg by mouth daily as needed for fluid.   Yes Historical Provider, MD  lisinopril (PRINIVIL,ZESTRIL) 40 MG tablet Take 40 mg by mouth daily.   Yes Historical Provider, MD  Misc Natural Products (PHYTOCILLIN) CAPS Take 1 capsule by mouth daily.   Yes Historical Provider, MD  naproxen (NAPROSYN) 500 MG tablet Take 500 mg by mouth 2 (two) times daily with a meal.   Yes Historical Provider, MD   Family History:  Family Status  Relation Status Death Age  . Father Deceased   . Mother Deceased    Social History:   reports that he has never smoked. He has never used smokeless tobacco. He reports that he does not drink alcohol or use illicit drugs.   Retired Production designer, theatre/television/film of the [employer]  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   Review of Systems:  He has been morbidly obese for many years.  He has a long history of sleep apnea.  He has had previous cataract extraction.  He at some hematuria in the winter and has BPH symptoms consistent with frequency and urgency.  He was started on medicine for that.  He also has a history of erectile dysfunction.  He has significant chronic low back pain and severe osteoarthritis of the knees but does not want to have knee replacement.  Other than as noted above, the remainder of the review of systems is normal  Physical Exam: BP 115/65 mmHg  Pulse 79  Resp 14  Ht 5\' 9"  (1.753 m)  Wt 133.811 kg (295 lb)  BMI 43.54 kg/m2  SpO2 94% General appearance: Pleasant morbidly obese white male in no acute  distress Head: Normocephalic, without obvious abnormality, atraumatic  Eyes: HEENT, EOMI, PERRLA, fundi not examined Neck: no adenopathy, no carotid bruit, no JVD and supple, symmetrical, trachea midline Lungs: clear to auscultation bilaterally Heart: regular rate and rhythm, S1, S2 normal, no murmur, click, rub or gallop Abdomen: soft, non-tender; bowel sounds normal; no masses,  no organomegaly Rectal: deferred Extremities: Bilateral bunions are noted on feet, changes of chronic venous insufficiency, 1+ edema, left wrist is wrapped at site of previous ganglion cyst Pulses: Femoral pulses are deep and are palpable.  Pedal pulses are 1+ Skin: Skin color, texture, turgor normal. No rashes or lesions Neurologic: Grossly normal  Labs: CBC  Recent Labs  05/19/15 2140  WBC 12.5*  RBC 5.14  HGB 15.3  HCT 43.4  PLT 208  MCV 84.4  MCH 29.8  MCHC 35.3  RDW 13.4   CMP   Recent Labs  05/19/15 2140  NA 134*  K 3.0*  CL 98*  CO2 26  GLUCOSE 138*  BUN 21*  CREATININE 0.80  CALCIUM 9.0  PROT 6.4*  ALBUMIN 3.4*  AST 112*  ALT 40  ALKPHOS 69  BILITOT 1.0  GFRNONAA >60  GFRAA >60   BNP    Component Value Date/Time   BNP 324.9* 05/19/2015 2140   Cardiac Panel (last 3 results)  Recent Labs  05/19/15 2140  TROPONINI 30.63*    EKG: Normal sinus rhythm, left bundle branch block, very poor progression suggestive of a previous anterior infarction  Radiology: Cardiomegaly, mild vascular congestion   IMPRESSIONS: 1.  Non-STEMI likely 3 days ago-currently pain-free but with some fluctuating symptoms since then 2.  Hypertension 3.  Morbid obesity 4.  Chronic low back pain 5.  Anxiety 6.  Severe osteoarthritis 7.  BPH with history of hematuria  PLAN: Intravenous heparin, check serial enzymes.  Echocardiogram, keep nothing by mouth for probable catheterization tomorrow.  Because of morbid obesity needs to be done through the radial approach.  Cardiac catheterization  procedure was discussed with the patient fully including risks of myocardial infarction, death, stroke, bleeding, arrhythmia, dye allergy, or renal insufficiency. The patient understands and is willing to proceed. Replete potassium.  Signed: Darden PalmerW. Spencer Tilley, Jr. MD [facility] PartnershipFACC Cardiology  05/19/2015, 11:46 PM

## 2015-05-19 NOTE — Progress Notes (Signed)
ANTICOAGULATION CONSULT NOTE - Initial Consult  Pharmacy Consult for Heparin Indication: ACS/STEMI  Allergies  Allergen Reactions  . Penicillins Hives    Patient Measurements:   Heparin Dosing Weight:   Vital Signs: BP: 101/49 mmHg (07/05 2144)  Labs:  Recent Labs  05/19/15 2140  HGB 15.3  HCT 43.4  PLT 208    CrCl cannot be calculated (Unknown ideal weight.).   Medical History: Past Medical History  Diagnosis Date  . Anxiety   . Hypertension   . Arthritis   . Sleep apnea     wears CPAP nightly  . Glaucoma    Assessment: [age] yoM with PMHx HTN, anxiety presents with chest pain with tingling sensation in left arm.  I-stat troponin elevated @ 30.  Pharmacy consulted to start IV heparin for ACS/STEMI.  No anticoagulants / antiplatelets noted PTA.  No issues with hgb or plts. CMET in process.   Heparin dosing wt = 102kg  Goal of Therapy:  Heparin level 0.3-0.7 units/ml Monitor platelets by anticoagulation protocol: Yes   Plan:  Heparin bolus 4000 units IV x 1 Heparin infusion 1250 units/hr = 12.5 ml/hr F/u heparin level in 6-8 hours depending on CrCl F/u baseline labs

## 2015-05-20 ENCOUNTER — Encounter (HOSPITAL_COMMUNITY)
Admission: EM | Disposition: A | Discharge status: Home Health | Payer: Self-pay | Source: Home / Self Care | Attending: Cardiothoracic Surgery

## 2015-05-20 ENCOUNTER — Inpatient Hospital Stay (HOSPITAL_COMMUNITY): Payer: Medicare Other

## 2015-05-20 ENCOUNTER — Encounter (HOSPITAL_COMMUNITY): Payer: Self-pay | Admitting: Cardiology

## 2015-05-20 ENCOUNTER — Other Ambulatory Visit: Payer: Self-pay | Admitting: *Deleted

## 2015-05-20 DIAGNOSIS — R0600 Dyspnea, unspecified: Secondary | ICD-10-CM

## 2015-05-20 DIAGNOSIS — I10 Essential (primary) hypertension: Secondary | ICD-10-CM

## 2015-05-20 DIAGNOSIS — I2510 Atherosclerotic heart disease of native coronary artery without angina pectoris: Secondary | ICD-10-CM

## 2015-05-20 DIAGNOSIS — I214 Non-ST elevation (NSTEMI) myocardial infarction: Principal | ICD-10-CM

## 2015-05-20 DIAGNOSIS — I9589 Other hypotension: Secondary | ICD-10-CM

## 2015-05-20 DIAGNOSIS — G4733 Obstructive sleep apnea (adult) (pediatric): Secondary | ICD-10-CM

## 2015-05-20 HISTORY — DX: Morbid (severe) obesity due to excess calories: E66.01

## 2015-05-20 HISTORY — PX: CARDIAC CATHETERIZATION: SHX172

## 2015-05-20 LAB — CBC
HCT: 41 % (ref 39.0–52.0)
Hemoglobin: 14 g/dL (ref 13.0–17.0)
MCH: 29.2 pg (ref 26.0–34.0)
MCHC: 34.1 g/dL (ref 30.0–36.0)
MCV: 85.4 fL (ref 78.0–100.0)
PLATELETS: 196 10*3/uL (ref 150–400)
RBC: 4.8 MIL/uL (ref 4.22–5.81)
RDW: 13.5 % (ref 11.5–15.5)
WBC: 10.7 10*3/uL — ABNORMAL HIGH (ref 4.0–10.5)

## 2015-05-20 LAB — SURGICAL PCR SCREEN
MRSA, PCR: NEGATIVE
Staphylococcus aureus: NEGATIVE

## 2015-05-20 LAB — COMPREHENSIVE METABOLIC PANEL
ALT: 40 U/L (ref 17–63)
AST: 105 U/L — ABNORMAL HIGH (ref 15–41)
Albumin: 2.9 g/dL — ABNORMAL LOW (ref 3.5–5.0)
Alkaline Phosphatase: 70 U/L (ref 38–126)
Anion gap: 9 (ref 5–15)
BILIRUBIN TOTAL: 0.9 mg/dL (ref 0.3–1.2)
BUN: 14 mg/dL (ref 6–20)
CHLORIDE: 99 mmol/L — AB (ref 101–111)
CO2: 28 mmol/L (ref 22–32)
Calcium: 9.1 mg/dL (ref 8.9–10.3)
Creatinine, Ser: 0.69 mg/dL (ref 0.61–1.24)
GLUCOSE: 128 mg/dL — AB (ref 65–99)
Potassium: 3.2 mmol/L — ABNORMAL LOW (ref 3.5–5.1)
Sodium: 136 mmol/L (ref 135–145)
Total Protein: 6.2 g/dL — ABNORMAL LOW (ref 6.5–8.1)

## 2015-05-20 LAB — LIPID PANEL
CHOL/HDL RATIO: 3 ratio
Cholesterol: 121 mg/dL (ref 0–200)
HDL: 40 mg/dL — AB (ref >40)
LDL Cholesterol: 69 mg/dL (ref 0–99)
Triglycerides: 61 mg/dL (ref <150)
VLDL: 12 mg/dL (ref 0–40)

## 2015-05-20 LAB — URINALYSIS, ROUTINE W REFLEX MICROSCOPIC
Glucose, UA: NEGATIVE mg/dL
Hgb urine dipstick: NEGATIVE
Ketones, ur: NEGATIVE mg/dL
Leukocytes, UA: NEGATIVE
Nitrite: NEGATIVE
Protein, ur: NEGATIVE mg/dL
Specific Gravity, Urine: 1.043 — ABNORMAL HIGH (ref 1.005–1.030)
Urobilinogen, UA: 2 mg/dL — ABNORMAL HIGH (ref 0.0–1.0)
pH: 5.5 (ref 5.0–8.0)

## 2015-05-20 LAB — TROPONIN I
TROPONIN I: 33.66 ng/mL — AB (ref <0.031)
TROPONIN I: 19.24 ng/mL — AB (ref <0.031)
Troponin I: 25.08 ng/mL (ref <0.031)

## 2015-05-20 LAB — TSH: TSH: 2.939 u[IU]/mL (ref 0.350–4.500)

## 2015-05-20 LAB — MRSA PCR SCREENING: MRSA by PCR: NEGATIVE

## 2015-05-20 LAB — HEPARIN LEVEL (UNFRACTIONATED): Heparin Unfractionated: <0.1 IU/mL — ABNORMAL LOW (ref 0.30–0.70)

## 2015-05-20 SURGERY — LEFT HEART CATH AND CORONARY ANGIOGRAPHY
Anesthesia: LOCAL

## 2015-05-20 MED ORDER — ACETAMINOPHEN 325 MG PO TABS
650.0000 mg | ORAL_TABLET | ORAL | Status: DC | PRN
  Administered 2015-05-21 – 2015-05-22 (×2): 650 mg via ORAL
  Filled 2015-05-20 (×2): qty 2

## 2015-05-20 MED ORDER — SODIUM CHLORIDE 0.9 % IV SOLN
INTRAVENOUS | Status: DC | PRN
  Administered 2015-05-20: 1000 mL via INTRAVENOUS

## 2015-05-20 MED ORDER — NITROGLYCERIN 1 MG/10 ML FOR IR/CATH LAB
INTRA_ARTERIAL | Status: AC
  Filled 2015-05-20: qty 10

## 2015-05-20 MED ORDER — ASPIRIN 81 MG PO CHEW
81.0000 mg | CHEWABLE_TABLET | ORAL | Status: AC
  Administered 2015-05-20: 81 mg via ORAL
  Filled 2015-05-20: qty 1

## 2015-05-20 MED ORDER — SODIUM CHLORIDE 0.9 % IJ SOLN
3.0000 mL | Freq: Two times a day (BID) | INTRAMUSCULAR | Status: DC
  Administered 2015-05-20 – 2015-05-21 (×4): 3 mL via INTRAVENOUS

## 2015-05-20 MED ORDER — HEPARIN (PORCINE) IN NACL 2-0.9 UNIT/ML-% IJ SOLN
INTRAMUSCULAR | Status: DC | PRN
  Administered 2015-05-20: 10:00:00

## 2015-05-20 MED ORDER — POTASSIUM CHLORIDE CRYS ER 20 MEQ PO TBCR
40.0000 meq | EXTENDED_RELEASE_TABLET | Freq: Once | ORAL | Status: AC
  Administered 2015-05-20: 40 meq via ORAL
  Filled 2015-05-20: qty 2

## 2015-05-20 MED ORDER — MIDAZOLAM HCL 2 MG/2ML IJ SOLN
INTRAMUSCULAR | Status: DC | PRN
  Administered 2015-05-20: 1 mg via INTRAVENOUS

## 2015-05-20 MED ORDER — BISOPROLOL FUMARATE 5 MG PO TABS
2.5000 mg | ORAL_TABLET | Freq: Every day | ORAL | Status: DC
  Administered 2015-05-20 – 2015-05-21 (×2): 2.5 mg via ORAL
  Filled 2015-05-20 (×2): qty 0.5

## 2015-05-20 MED ORDER — CHLORTHALIDONE 50 MG PO TABS
50.0000 mg | ORAL_TABLET | Freq: Every day | ORAL | Status: DC
  Filled 2015-05-20: qty 1

## 2015-05-20 MED ORDER — SODIUM CHLORIDE 0.9 % IV SOLN
INTRAVENOUS | Status: AC
  Administered 2015-05-20: 02:00:00 via INTRAVENOUS

## 2015-05-20 MED ORDER — ASPIRIN EC 81 MG PO TBEC
81.0000 mg | DELAYED_RELEASE_TABLET | Freq: Every day | ORAL | Status: DC
  Administered 2015-05-21: 81 mg via ORAL
  Filled 2015-05-20 (×2): qty 1

## 2015-05-20 MED ORDER — IOHEXOL 350 MG/ML SOLN
INTRAVENOUS | Status: DC | PRN
  Administered 2015-05-20: 100 mL via INTRAVENOUS

## 2015-05-20 MED ORDER — SODIUM CHLORIDE 0.9 % WEIGHT BASED INFUSION: 3.0000 mL/kg/h | INTRAVENOUS | Status: DC

## 2015-05-20 MED ORDER — NITROGLYCERIN 0.4 MG SL SUBL: 0.4000 mg | SUBLINGUAL_TABLET | SUBLINGUAL | Status: DC | PRN

## 2015-05-20 MED ORDER — SODIUM CHLORIDE 0.9 % IV SOLN: 250.0000 mL | INTRAVENOUS | Status: DC | PRN

## 2015-05-20 MED ORDER — FENTANYL CITRATE (PF) 100 MCG/2ML IJ SOLN
INTRAMUSCULAR | Status: DC | PRN
  Administered 2015-05-20: 25 ug via INTRAVENOUS

## 2015-05-20 MED ORDER — FENTANYL CITRATE (PF) 100 MCG/2ML IJ SOLN
INTRAMUSCULAR | Status: AC
  Filled 2015-05-20: qty 2

## 2015-05-20 MED ORDER — SODIUM CHLORIDE 0.9 % IJ SOLN
3.0000 mL | Freq: Two times a day (BID) | INTRAMUSCULAR | Status: DC
Start: 2015-05-20 — End: 2015-05-20
  Administered 2015-05-20: 3 mL via INTRAVENOUS

## 2015-05-20 MED ORDER — LISINOPRIL 40 MG PO TABS
40.0000 mg | ORAL_TABLET | Freq: Every day | ORAL | Status: DC
  Filled 2015-05-20: qty 1

## 2015-05-20 MED ORDER — HEPARIN BOLUS VIA INFUSION
3000.0000 [IU] | Freq: Once | INTRAVENOUS | Status: AC
  Administered 2015-05-20: 3000 [IU] via INTRAVENOUS
  Filled 2015-05-20: qty 3000

## 2015-05-20 MED ORDER — HEPARIN SODIUM (PORCINE) 1000 UNIT/ML IJ SOLN
INTRAMUSCULAR | Status: AC
  Filled 2015-05-20: qty 1

## 2015-05-20 MED ORDER — SODIUM CHLORIDE 0.9 % WEIGHT BASED INFUSION: 1.0000 mL/kg/h | INTRAVENOUS | Status: DC

## 2015-05-20 MED ORDER — HEPARIN SODIUM (PORCINE) 1000 UNIT/ML IJ SOLN
INTRAMUSCULAR | Status: DC | PRN
  Administered 2015-05-20: 6000 [IU] via INTRAVENOUS

## 2015-05-20 MED ORDER — ONDANSETRON HCL 4 MG/2ML IJ SOLN: 4.0000 mg | Freq: Four times a day (QID) | INTRAMUSCULAR | Status: DC | PRN

## 2015-05-20 MED ORDER — LIDOCAINE HCL (PF) 1 % IJ SOLN
INTRAMUSCULAR | Status: AC
  Filled 2015-05-20: qty 30

## 2015-05-20 MED ORDER — ALPRAZOLAM 0.25 MG PO TABS
0.2500 mg | ORAL_TABLET | Freq: Three times a day (TID) | ORAL | Status: DC | PRN
  Administered 2015-05-20 (×3): 0.25 mg via ORAL
  Filled 2015-05-20 (×3): qty 1

## 2015-05-20 MED ORDER — SODIUM CHLORIDE 0.9 % IJ SOLN: 3.0000 mL | INTRAMUSCULAR | Status: DC | PRN

## 2015-05-20 MED ORDER — FINASTERIDE 5 MG PO TABS
5.0000 mg | ORAL_TABLET | Freq: Every day | ORAL | Status: DC
  Administered 2015-05-20 – 2015-05-31 (×11): 5 mg via ORAL
  Filled 2015-05-20 (×12): qty 1

## 2015-05-20 MED ORDER — ASPIRIN EC 81 MG PO TBEC
81.0000 mg | DELAYED_RELEASE_TABLET | Freq: Every day | ORAL | Status: DC
  Filled 2015-05-20: qty 1

## 2015-05-20 MED ORDER — HEPARIN (PORCINE) IN NACL 2-0.9 UNIT/ML-% IJ SOLN
INTRAMUSCULAR | Status: AC
  Filled 2015-05-20: qty 1000

## 2015-05-20 MED ORDER — MIDAZOLAM HCL 2 MG/2ML IJ SOLN
INTRAMUSCULAR | Status: AC
  Filled 2015-05-20: qty 2

## 2015-05-20 MED ORDER — HEPARIN (PORCINE) IN NACL 100-0.45 UNIT/ML-% IJ SOLN
2250.0000 [IU]/h | INTRAMUSCULAR | Status: DC
  Administered 2015-05-20: 1550 [IU]/h via INTRAVENOUS
  Administered 2015-05-21: 1850 [IU]/h via INTRAVENOUS
  Administered 2015-05-22: 2050 [IU]/h via INTRAVENOUS
  Filled 2015-05-20 (×7): qty 250

## 2015-05-20 MED ORDER — VERAPAMIL HCL 2.5 MG/ML IV SOLN
INTRAVENOUS | Status: AC
  Filled 2015-05-20: qty 2

## 2015-05-20 MED ORDER — FUROSEMIDE 40 MG PO TABS: 40.0000 mg | ORAL_TABLET | Freq: Every day | ORAL | Status: DC | PRN

## 2015-05-20 MED ORDER — SODIUM CHLORIDE 0.9 % IV SOLN
INTRAVENOUS | Status: AC
  Administered 2015-05-20: 11:00:00 via INTRAVENOUS

## 2015-05-20 MED ORDER — FUROSEMIDE 40 MG PO TABS
40.0000 mg | ORAL_TABLET | Freq: Two times a day (BID) | ORAL | Status: DC
  Administered 2015-05-21 (×2): 40 mg via ORAL
  Filled 2015-05-20 (×5): qty 1

## 2015-05-20 MED ORDER — VERAPAMIL HCL 2.5 MG/ML IV SOLN
INTRAVENOUS | Status: DC | PRN
  Administered 2015-05-20: 10:00:00 via INTRA_ARTERIAL

## 2015-05-20 MED ORDER — ASPIRIN EC 81 MG PO TBEC: 81.0000 mg | DELAYED_RELEASE_TABLET | Freq: Every day | ORAL | Status: DC

## 2015-05-20 MED ORDER — ASPIRIN 81 MG PO CHEW: 81.0000 mg | CHEWABLE_TABLET | ORAL | Status: DC

## 2015-05-20 MED ORDER — ATORVASTATIN CALCIUM 80 MG PO TABS
80.0000 mg | ORAL_TABLET | Freq: Every day | ORAL | Status: DC
  Administered 2015-05-20 – 2015-05-30 (×10): 80 mg via ORAL
  Filled 2015-05-20 (×12): qty 1

## 2015-05-20 SURGICAL SUPPLY — 11 items
CATH INFINITI 5 FR JL3.5 (CATHETERS) ×2 IMPLANT
CATH INFINITI 5FR ANG PIGTAIL (CATHETERS) ×2 IMPLANT
CATH INFINITI JR4 5F (CATHETERS) ×2 IMPLANT
DEVICE RAD COMP TR BAND LRG (VASCULAR PRODUCTS) ×2 IMPLANT
GLIDESHEATH SLEND SS 6F .021 (SHEATH) ×2 IMPLANT
HOVERMATT SINGLE USE (MISCELLANEOUS) ×1 IMPLANT
KIT HEART LEFT (KITS) ×2 IMPLANT
PACK CARDIAC CATHETERIZATION (CUSTOM PROCEDURE TRAY) ×2 IMPLANT
TRANSDUCER W/STOPCOCK (MISCELLANEOUS) ×2 IMPLANT
TUBING CIL FLEX 10 FLL-RA (TUBING) ×2 IMPLANT
WIRE SAFE-T 1.5MM-J .035X260CM (WIRE) ×2 IMPLANT

## 2015-05-20 NOTE — H&P (View-Only) (Signed)
DAILY PROGRESS NOTE  Subjective:  No chest pain overnight, has felt early satiety, bloating and decreased appetite. Symptoms a few days ago were upper back pain. Troponin very elevated to 33.66, but trending down. Leukocytosis is improving. BP soft overnight and I'm concerned about RV involvement of his MI.  Objective:  Temp:  [98.2 F (36.8 C)-98.4 F (36.9 C)] 98.4 F (36.9 C) (07/06 0722) Pulse Rate:  [67-92] 91 (07/06 0818) Resp:  [14-27] 19 (07/06 0818) BP: (76-115)/(31-74) 95/55 mmHg (07/06 0818) SpO2:  [91 %-97 %] 95 % (07/06 0818) Weight:  [289 lb 0.4 oz (131.1 kg)-295 lb (133.811 kg)] 289 lb 0.4 oz (131.1 kg) (07/06 0115) Weight change:   Intake/Output from previous day: 07/05 0701 - 07/06 0700 In: 750.8 [I.V.:750.8] Out: 500 [Urine:500]  Intake/Output from this shift: Total I/O In: 130.8 [I.V.:130.8] Out: -   Medications: Current Facility-Administered Medications  Medication Dose Route Frequency Provider Last Rate Last Dose  . 0.9 %  sodium chloride infusion  250 mL Intravenous PRN Jacolyn Reedy, MD      . 0.9% sodium chloride infusion  1 mL/kg/hr Intravenous Continuous Thayer Headings, MD 133.8 mL/hr at 05/20/15 0800 1 mL/kg/hr at 05/20/15 0800  . acetaminophen (TYLENOL) tablet 650 mg  650 mg Oral Q4H PRN Jacolyn Reedy, MD      . ALPRAZolam Duanne Moron) tablet 0.25 mg  0.25 mg Oral TID PRN Jacolyn Reedy, MD   0.25 mg at 05/20/15 0257  . aspirin EC tablet 81 mg  81 mg Oral Daily Thayer Headings, MD      . atorvastatin (LIPITOR) tablet 80 mg  80 mg Oral q1800 Jacolyn Reedy, MD      . bisoprolol (ZEBETA) tablet 2.5 mg  2.5 mg Oral Daily Jacolyn Reedy, MD      . chlorthalidone (HYGROTON) tablet 50 mg  50 mg Oral Daily Jacolyn Reedy, MD      . finasteride (PROSCAR) tablet 5 mg  5 mg Oral Daily Jacolyn Reedy, MD      . furosemide (LASIX) tablet 40 mg  40 mg Oral Daily PRN Jacolyn Reedy, MD      . heparin ADULT infusion 100 units/mL (25000  units/250 mL)  1,550 Units/hr Intravenous Continuous Erenest Blank, RPH 15.5 mL/hr at 05/20/15 0725 1,550 Units/hr at 05/20/15 0725  . lisinopril (PRINIVIL,ZESTRIL) tablet 40 mg  40 mg Oral Daily Jacolyn Reedy, MD      . nitroGLYCERIN (NITROSTAT) SL tablet 0.4 mg  0.4 mg Sublingual Q5 Min x 3 PRN Jacolyn Reedy, MD      . ondansetron [facility]) injection 4 mg  4 mg Intravenous Q6H PRN Jacolyn Reedy, MD      . potassium chloride SA (K-DUR,KLOR-CON) CR tablet 40 mEq  40 mEq Oral Once Rogelia Mire, NP      . sodium chloride 0.9 % injection 3 mL  3 mL Intravenous Q12H Jacolyn Reedy, MD   3 mL at 05/20/15 0130  . sodium chloride 0.9 % injection 3 mL  3 mL Intravenous PRN Jacolyn Reedy, MD        Physical Exam: General appearance: alert and no distress Neck: JVD - 3 cm above sternal notch and no carotid bruit Lungs: clear to auscultation bilaterally Heart: regular rate and rhythm, S1, S2 normal and S3 present Abdomen: soft, non-tender; bowel sounds normal; no masses,  no organomegaly and mobidly obese, ?fluid wave Extremities: edema trace  sockline edema Pulses: 1+ pulses Skin: Skin color, texture, turgor normal. No rashes or lesions Neurologic: Grossly normal  Lab Results: Results for orders placed or performed during the hospital encounter of 05/19/15 (from the past 48 hour(s))  CBC     Status: Abnormal   Collection Time: 05/19/15  9:40 PM  Result Value Ref Range   WBC 12.5 (H) 4.0 - 10.5 K/uL   RBC 5.14 4.22 - 5.81 MIL/uL   Hemoglobin 15.3 13.0 - 17.0 g/dL   HCT 43.4 39.0 - 52.0 %   MCV 84.4 78.0 - 100.0 fL   MCH 29.8 26.0 - 34.0 pg   MCHC 35.3 30.0 - 36.0 g/dL   RDW 13.4 11.5 - 15.5 %   Platelets 208 150 - 400 K/uL  BNP (order ONLY if patient complains of dyspnea/SOB AND you have documented it for THIS visit)     Status: Abnormal   Collection Time: 05/19/15  9:40 PM  Result Value Ref Range   B Natriuretic Peptide 324.9 (H) 0.0 - 100.0 pg/mL  Comprehensive  metabolic panel     Status: Abnormal   Collection Time: 05/19/15  9:40 PM  Result Value Ref Range   Sodium 134 (L) 135 - 145 mmol/L   Potassium 3.0 (L) 3.5 - 5.1 mmol/L   Chloride 98 (L) 101 - 111 mmol/L   CO2 26 22 - 32 mmol/L   Glucose, Bld 138 (H) 65 - 99 mg/dL   BUN 21 (H) 6 - 20 mg/dL   Creatinine, Ser 0.80 0.61 - 1.24 mg/dL   Calcium 9.0 8.9 - 10.3 mg/dL   Total Protein 6.4 (L) 6.5 - 8.1 g/dL   Albumin 3.4 (L) 3.5 - 5.0 g/dL   AST 112 (H) 15 - 41 U/L   ALT 40 17 - 63 U/L   Alkaline Phosphatase 69 38 - 126 U/L   Total Bilirubin 1.0 0.3 - 1.2 mg/dL   GFR calc non Af Amer >60 >60 mL/min   GFR calc Af Amer >60 >60 mL/min    Comment: (NOTE) The eGFR has been calculated using the CKD EPI equation. This calculation has not been validated in all clinical situations. eGFR's persistently <60 mL/min signify possible Chronic Kidney Disease.    Anion gap 10 5 - 15  Lipase, blood     Status: Abnormal   Collection Time: 05/19/15  9:40 PM  Result Value Ref Range   Lipase 15 (L) 22 - 51 U/L  Protime-INR     Status: None   Collection Time: 05/19/15  9:40 PM  Result Value Ref Range   Prothrombin Time 14.1 11.6 - 15.2 seconds   INR 1.07 0.00 - 1.49  Troponin I     Status: Abnormal   Collection Time: 05/19/15  9:40 PM  Result Value Ref Range   Troponin I 30.63 (HH) <0.031 ng/mL    Comment:        POSSIBLE MYOCARDIAL ISCHEMIA. SERIAL TESTING RECOMMENDED. REPEATED TO VERIFY CRITICAL RESULT CALLED TO, READ BACK BY AND VERIFIED WITH: A.LEDWELL,RN AT 2307 ON 05/19/15 BY W.SHEA   APTT     Status: None   Collection Time: 05/19/15  9:40 PM  Result Value Ref Range   aPTT 28 24 - 37 seconds  I-stat troponin, ED  (not at [facility])     Status: Abnormal   Collection Time: 05/19/15  9:44 PM  Result Value Ref Range   Troponin i, poc 30.94 (HH) 0.00 - 0.08 ng/mL   Comment NOTIFIED PHYSICIAN  Comment 3            Comment: Due to the release kinetics of cTnI, a negative result within the  first hours of the onset of symptoms does not rule out myocardial infarction with certainty. If myocardial infarction is still suspected, repeat the test at appropriate intervals.   Urinalysis, Routine w reflex microscopic (not at [facility])     Status: Abnormal   Collection Time: 05/19/15  9:57 PM  Result Value Ref Range   Color, Urine ORANGE (A) YELLOW    Comment: BIOCHEMICALS MAY BE AFFECTED BY COLOR   APPearance CLOUDY (A) CLEAR   Specific Gravity, Urine 1.022 1.005 - 1.030   pH 6.5 5.0 - 8.0   Glucose, UA NEGATIVE NEGATIVE mg/dL   Hgb urine dipstick NEGATIVE NEGATIVE   Bilirubin Urine MODERATE (A) NEGATIVE   Ketones, ur NEGATIVE NEGATIVE mg/dL   Protein, ur 30 (A) NEGATIVE mg/dL   Urobilinogen, UA 4.0 (H) 0.0 - 1.0 mg/dL   Nitrite NEGATIVE NEGATIVE   Leukocytes, UA SMALL (A) NEGATIVE  Urine microscopic-add on     Status: Abnormal   Collection Time: 05/19/15  9:57 PM  Result Value Ref Range   WBC, UA 3-6 <3 WBC/hpf   RBC / HPF 0-2 <3 RBC/hpf   Casts HYALINE CASTS (A) NEGATIVE   Urine-Other MUCOUS PRESENT   MRSA PCR Screening     Status: None   Collection Time: 05/20/15 12:21 AM  Result Value Ref Range   MRSA by PCR NEGATIVE NEGATIVE    Comment:        The GeneXpert MRSA Assay (FDA approved for NASAL specimens only), is one component of a comprehensive MRSA colonization surveillance program. It is not intended to diagnose MRSA infection nor to guide or monitor treatment for MRSA infections.   Comprehensive metabolic panel     Status: Abnormal   Collection Time: 05/20/15  1:10 AM  Result Value Ref Range   Sodium 136 135 - 145 mmol/L   Potassium 3.2 (L) 3.5 - 5.1 mmol/L   Chloride 99 (L) 101 - 111 mmol/L   CO2 28 22 - 32 mmol/L   Glucose, Bld 128 (H) 65 - 99 mg/dL   BUN 14 6 - 20 mg/dL   Creatinine, Ser 0.69 0.61 - 1.24 mg/dL   Calcium 9.1 8.9 - 10.3 mg/dL   Total Protein 6.2 (L) 6.5 - 8.1 g/dL   Albumin 2.9 (L) 3.5 - 5.0 g/dL   AST 105 (H) 15 - 41 U/L   ALT 40  17 - 63 U/L   Alkaline Phosphatase 70 38 - 126 U/L   Total Bilirubin 0.9 0.3 - 1.2 mg/dL   GFR calc non Af Amer >60 >60 mL/min   GFR calc Af Amer >60 >60 mL/min    Comment: (NOTE) The eGFR has been calculated using the CKD EPI equation. This calculation has not been validated in all clinical situations. eGFR's persistently <60 mL/min signify possible Chronic Kidney Disease.    Anion gap 9 5 - 15  TSH     Status: None   Collection Time: 05/20/15  1:10 AM  Result Value Ref Range   TSH 2.939 0.350 - 4.500 uIU/mL  Troponin I-(serum)     Status: Abnormal   Collection Time: 05/20/15  1:10 AM  Result Value Ref Range   Troponin I 33.66 (HH) <0.031 ng/mL    Comment:        POSSIBLE MYOCARDIAL ISCHEMIA. SERIAL TESTING RECOMMENDED. CRITICAL RESULT CALLED TO, READ BACK  BY AND VERIFIED WITH: [facility] RN 05/20/2015 0225 JORDANS REPEATED TO VERIFY   Lipid panel     Status: Abnormal   Collection Time: 05/20/15  1:10 AM  Result Value Ref Range   Cholesterol 121 0 - 200 mg/dL   Triglycerides 61 <150 mg/dL   HDL 40 (L) >40 mg/dL   Total CHOL/HDL Ratio 3.0 RATIO   VLDL 12 0 - 40 mg/dL   LDL Cholesterol 69 0 - 99 mg/dL    Comment:        Total Cholesterol/HDL:CHD Risk Coronary Heart Disease Risk Table                     Men   Women  1/2 Average Risk   3.4   3.3  Average Risk       5.0   4.4  2 X Average Risk   9.6   7.1  3 X Average Risk  23.4   11.0        Use the calculated Patient Ratio above and the CHD Risk Table to determine the patient's CHD Risk.        ATP III CLASSIFICATION (LDL):  <100     mg/dL   Optimal  100-129  mg/dL   Near or Above                    Optimal  130-159  mg/dL   Borderline  160-189  mg/dL   High  >190     mg/dL   Very High   Heparin level (unfractionated)     Status: Abnormal   Collection Time: 05/20/15  5:04 AM  Result Value Ref Range   Heparin Unfractionated <0.10 (L) 0.30 - 0.70 IU/mL    Comment:        IF HEPARIN RESULTS ARE  BELOW EXPECTED VALUES, AND PATIENT DOSAGE HAS BEEN CONFIRMED, SUGGEST FOLLOW UP TESTING OF ANTITHROMBIN III LEVELS.   Troponin I-(serum)     Status: Abnormal   Collection Time: 05/20/15  6:31 AM  Result Value Ref Range   Troponin I 25.08 (HH) <0.031 ng/mL    Comment:        POSSIBLE MYOCARDIAL ISCHEMIA. SERIAL TESTING RECOMMENDED. REPEATED TO VERIFY CRITICAL VALUE NOTED.  VALUE IS CONSISTENT WITH PREVIOUSLY REPORTED AND CALLED VALUE.   CBC     Status: Abnormal   Collection Time: 05/20/15  6:31 AM  Result Value Ref Range   WBC 10.7 (H) 4.0 - 10.5 K/uL   RBC 4.80 4.22 - 5.81 MIL/uL   Hemoglobin 14.0 13.0 - 17.0 g/dL   HCT 41.0 39.0 - 52.0 %   MCV 85.4 78.0 - 100.0 fL   MCH 29.2 26.0 - 34.0 pg   MCHC 34.1 30.0 - 36.0 g/dL   RDW 13.5 11.5 - 15.5 %   Platelets 196 150 - 400 K/uL    Imaging: Dg Chest Port 1 View  05/19/2015   CLINICAL DATA:  Acute onset of sharp upper back pain and left arm tingling. Shortness of breath and centralized chest pain. Diaphoresis and nausea. Initial encounter.  EXAM: PORTABLE CHEST - 1 VIEW  COMPARISON:  None.  FINDINGS: The lungs are well-aerated. Mild vascular congestion is noted. Minimal bibasilar atelectasis is seen. There is no evidence of pleural effusion or pneumothorax.  The cardiomediastinal silhouette is mildly enlarged. No acute osseous abnormalities are seen.  IMPRESSION: Mild vascular congestion and mild cardiomegaly noted. Minimal bibasilar atelectasis seen.   Electronically Signed   By:  Garald Balding M.D.   On: 05/19/2015 21:35    Assessment:  1. Active Problems: 2.   NSTEMI (non-ST elevated myocardial infarction) 3.   Non-STEMI (non-ST elevated myocardial infarction) 4. Suspect acute right heart failure and possible systolic dysfunction, secondary to MI 5. Hypotension - possible cardiogenic shock  Plan:  1. Jonathan Le has had a large MI, probably several days ago. He is having symptoms of abdominal fullness, bloating, loss of  appetite, but no further upper back or chest pain. He is hypotensive, suggesting some degree of cardiogenic shock from LV and/or RV dysfunction. Will need to d/c chlorthalidone and lisinopril for now. Continue low dose bisoprolol. Will likely need additional diuresis as BP allows.  Plan for cardiac catheterization this afternoon.  Time Spent Directly with Patient:  15 minutes  Length of Stay:  LOS: 1 day   Pixie Casino, MD, [facility] Attending Cardiologist [place] 05/20/2015, 8:36 AM

## 2015-05-20 NOTE — Progress Notes (Signed)
Echocardiogram 2D Echocardiogram has been performed.  Dorothey BasemanReel, Rita Prom M 05/20/2015, 9:32 AM

## 2015-05-20 NOTE — Progress Notes (Signed)
ANTICOAGULATION CONSULT NOTE - Follow Up Consult  Pharmacy Consult for Heparin  Indication: chest pain/ACS  Allergies  Allergen Reactions  . Penicillins Hives    Patient Measurements: Height: 5\' 9"  (175.3 cm) Weight: 289 lb 0.4 oz (131.1 kg) IBW/kg (Calculated) : 70.7  Vital Signs: Temp: 98.2 F (36.8 C) (07/06 0300) Temp Source: Oral (07/06 0300) BP: 85/37 mmHg (07/06 0600) Pulse Rate: 67 (07/06 0600)  Labs:  Recent Labs  05/19/15 2140 05/20/15 0110 05/20/15 0504  HGB 15.3  --   --   HCT 43.4  --   --   PLT 208  --   --   APTT 28  --   --   LABPROT 14.1  --   --   INR 1.07  --   --   HEPARINUNFRC  --   --  <0.10*  CREATININE 0.80 0.69  --   TROPONINI 30.63* 33.66*  --     Estimated Creatinine Clearance: 117 mL/min (by C-G formula based on Cr of 0.69).   Assessment: Sub-therapeutic heparin level, no issues per RN, other labs as above.   Goal of Therapy:  Heparin level 0.3-0.7 units/ml Monitor platelets by anticoagulation protocol: Yes   Plan:  -Heparin 3000 units BOLUS -Increase heparin to 1550 units/hr -1500 HL -Daily CBC/HL -Monitor for bleeding  Abran DukeLedford, Catherina Pates 05/20/2015,6:54 AM

## 2015-05-20 NOTE — Progress Notes (Signed)
eLink Physician-Brief Progress Note Patient Name: Ardyth HarpsHarold D Gauger DOB: 07/17/1946 MRN: [id number]   Date of Service  05/20/2015  HPI/Events of Note  69 yo male with PMH of HTN, Sleep Apnea and  Morbid Obesity. Admitted with NSTEMI. Current medical regimen includes ASA, Lipitor, Zebeta, Hygroton, Lisinopril and Heparin IV infusion. Management per Cardiology. BP = 92/56 and HR = 100. Sat = 95% and RR = 22.  eICU Interventions  Continue present management.      Intervention Category Evaluation Type: New Patient Evaluation  Xiamara Hulet Eugene 05/20/2015, 1:33 AM

## 2015-05-20 NOTE — Progress Notes (Signed)
ANTICOAGULATION CONSULT NOTE - Follow Up Consult  Pharmacy Consult for Heparin  Indication: chest pain/ACS  Allergies  Allergen Reactions  . Penicillins Hives    Patient Measurements: Height: 5\' 9"  (175.3 cm) Weight: 289 lb 0.4 oz (131.1 kg) IBW/kg (Calculated) : 70.7 Heparin dosing wt 101kg Labs:  Recent Labs  05/19/15 2140 05/20/15 0110 05/20/15 0504 05/20/15 0631  HGB 15.3  --   --  14.0  HCT 43.4  --   --  41.0  PLT 208  --   --  196  APTT 28  --   --   --   LABPROT 14.1  --   --   --   INR 1.07  --   --   --   HEPARINUNFRC  --   --  <0.10*  --   CREATININE 0.80 0.69  --   --   TROPONINI 30.63* 33.66*  --  25.08*    Estimated Creatinine Clearance: 117 mL/min (by C-G formula based on Cr of 0.69).  Assessment: [age] yom s/p MI several days ago. 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  Orders to resume heparin this evening. No hematoma or bleeding complications noted during cath.  Goal of Therapy:  Heparin level 0.3-0.7 units/ml Monitor platelets by anticoagulation protocol: Yes   Plan:  -Restart heparin at 1550 units/hr tonight -0200 HL -Daily CBC/HL -Monitor for bleeding  Sheppard CoilFrank Wilson PharmD., BCPS Clinical Pharmacist Pager [phone] 05/20/2015 12:06 PM

## 2015-05-20 NOTE — Progress Notes (Signed)
DAILY PROGRESS NOTE  Subjective:  No chest pain overnight, has felt early satiety, bloating and decreased appetite. Symptoms a few days ago were upper back pain. Troponin very elevated to 33.66, but trending down. Leukocytosis is improving. BP soft overnight and I'm concerned about RV involvement of his MI.  Objective:  Temp:  [98.2 F (36.8 C)-98.4 F (36.9 C)] 98.4 F (36.9 C) (07/06 0722) Pulse Rate:  [67-92] 91 (07/06 0818) Resp:  [14-27] 19 (07/06 0818) BP: (76-115)/(31-74) 95/55 mmHg (07/06 0818) SpO2:  [91 %-97 %] 95 % (07/06 0818) Weight:  [289 lb 0.4 oz (131.1 kg)-295 lb (133.811 kg)] 289 lb 0.4 oz (131.1 kg) (07/06 0115) Weight change:   Intake/Output from previous day: 07/05 0701 - 07/06 0700 In: 750.8 [I.V.:750.8] Out: 500 [Urine:500]  Intake/Output from this shift: Total I/O In: 130.8 [I.V.:130.8] Out: -   Medications: Current Facility-Administered Medications  Medication Dose Route Frequency Provider Last Rate Last Dose  . 0.9 %  sodium chloride infusion  250 mL Intravenous PRN Jacolyn Reedy, MD      . 0.9% sodium chloride infusion  1 mL/kg/hr Intravenous Continuous Thayer Headings, MD 133.8 mL/hr at 05/20/15 0800 1 mL/kg/hr at 05/20/15 0800  . acetaminophen (TYLENOL) tablet 650 mg  650 mg Oral Q4H PRN Jacolyn Reedy, MD      . ALPRAZolam Duanne Moron) tablet 0.25 mg  0.25 mg Oral TID PRN Jacolyn Reedy, MD   0.25 mg at 05/20/15 0257  . aspirin EC tablet 81 mg  81 mg Oral Daily Thayer Headings, MD      . atorvastatin (LIPITOR) tablet 80 mg  80 mg Oral q1800 Jacolyn Reedy, MD      . bisoprolol (ZEBETA) tablet 2.5 mg  2.5 mg Oral Daily Jacolyn Reedy, MD      . chlorthalidone (HYGROTON) tablet 50 mg  50 mg Oral Daily Jacolyn Reedy, MD      . finasteride (PROSCAR) tablet 5 mg  5 mg Oral Daily Jacolyn Reedy, MD      . furosemide (LASIX) tablet 40 mg  40 mg Oral Daily PRN Jacolyn Reedy, MD      . heparin ADULT infusion 100 units/mL (25000  units/250 mL)  1,550 Units/hr Intravenous Continuous Erenest Blank, RPH 15.5 mL/hr at 05/20/15 0725 1,550 Units/hr at 05/20/15 0725  . lisinopril (PRINIVIL,ZESTRIL) tablet 40 mg  40 mg Oral Daily Jacolyn Reedy, MD      . nitroGLYCERIN (NITROSTAT) SL tablet 0.4 mg  0.4 mg Sublingual Q5 Min x 3 PRN Jacolyn Reedy, MD      . ondansetron [facility]) injection 4 mg  4 mg Intravenous Q6H PRN Jacolyn Reedy, MD      . potassium chloride SA (K-DUR,KLOR-CON) CR tablet 40 mEq  40 mEq Oral Once Rogelia Mire, NP      . sodium chloride 0.9 % injection 3 mL  3 mL Intravenous Q12H Jacolyn Reedy, MD   3 mL at 05/20/15 0130  . sodium chloride 0.9 % injection 3 mL  3 mL Intravenous PRN Jacolyn Reedy, MD        Physical Exam: General appearance: alert and no distress Neck: JVD - 3 cm above sternal notch and no carotid bruit Lungs: clear to auscultation bilaterally Heart: regular rate and rhythm, S1, S2 normal and S3 present Abdomen: soft, non-tender; bowel sounds normal; no masses,  no organomegaly and mobidly obese, ?fluid wave Extremities: edema trace  sockline edema Pulses: 1+ pulses Skin: Skin color, texture, turgor normal. No rashes or lesions Neurologic: Grossly normal  Lab Results: Results for orders placed or performed during the hospital encounter of 05/19/15 (from the past 48 hour(s))  CBC     Status: Abnormal   Collection Time: 05/19/15  9:40 PM  Result Value Ref Range   WBC 12.5 (H) 4.0 - 10.5 K/uL   RBC 5.14 4.22 - 5.81 MIL/uL   Hemoglobin 15.3 13.0 - 17.0 g/dL   HCT 43.4 39.0 - 52.0 %   MCV 84.4 78.0 - 100.0 fL   MCH 29.8 26.0 - 34.0 pg   MCHC 35.3 30.0 - 36.0 g/dL   RDW 13.4 11.5 - 15.5 %   Platelets 208 150 - 400 K/uL  BNP (order ONLY if patient complains of dyspnea/SOB AND you have documented it for THIS visit)     Status: Abnormal   Collection Time: 05/19/15  9:40 PM  Result Value Ref Range   B Natriuretic Peptide 324.9 (H) 0.0 - 100.0 pg/mL  Comprehensive  metabolic panel     Status: Abnormal   Collection Time: 05/19/15  9:40 PM  Result Value Ref Range   Sodium 134 (L) 135 - 145 mmol/L   Potassium 3.0 (L) 3.5 - 5.1 mmol/L   Chloride 98 (L) 101 - 111 mmol/L   CO2 26 22 - 32 mmol/L   Glucose, Bld 138 (H) 65 - 99 mg/dL   BUN 21 (H) 6 - 20 mg/dL   Creatinine, Ser 0.80 0.61 - 1.24 mg/dL   Calcium 9.0 8.9 - 10.3 mg/dL   Total Protein 6.4 (L) 6.5 - 8.1 g/dL   Albumin 3.4 (L) 3.5 - 5.0 g/dL   AST 112 (H) 15 - 41 U/L   ALT 40 17 - 63 U/L   Alkaline Phosphatase 69 38 - 126 U/L   Total Bilirubin 1.0 0.3 - 1.2 mg/dL   GFR calc non Af Amer >60 >60 mL/min   GFR calc Af Amer >60 >60 mL/min    Comment: (NOTE) The eGFR has been calculated using the CKD EPI equation. This calculation has not been validated in all clinical situations. eGFR's persistently <60 mL/min signify possible Chronic Kidney Disease.    Anion gap 10 5 - 15  Lipase, blood     Status: Abnormal   Collection Time: 05/19/15  9:40 PM  Result Value Ref Range   Lipase 15 (L) 22 - 51 U/L  Protime-INR     Status: None   Collection Time: 05/19/15  9:40 PM  Result Value Ref Range   Prothrombin Time 14.1 11.6 - 15.2 seconds   INR 1.07 0.00 - 1.49  Troponin I     Status: Abnormal   Collection Time: 05/19/15  9:40 PM  Result Value Ref Range   Troponin I 30.63 (HH) <0.031 ng/mL    Comment:        POSSIBLE MYOCARDIAL ISCHEMIA. SERIAL TESTING RECOMMENDED. REPEATED TO VERIFY CRITICAL RESULT CALLED TO, READ BACK BY AND VERIFIED WITH: A.LEDWELL,RN AT 2307 ON 05/19/15 BY W.SHEA   APTT     Status: None   Collection Time: 05/19/15  9:40 PM  Result Value Ref Range   aPTT 28 24 - 37 seconds  I-stat troponin, ED  (not at [facility])     Status: Abnormal   Collection Time: 05/19/15  9:44 PM  Result Value Ref Range   Troponin i, poc 30.94 (HH) 0.00 - 0.08 ng/mL   Comment NOTIFIED PHYSICIAN  Comment 3            Comment: Due to the release kinetics of cTnI, a negative result within the  first hours of the onset of symptoms does not rule out myocardial infarction with certainty. If myocardial infarction is still suspected, repeat the test at appropriate intervals.   Urinalysis, Routine w reflex microscopic (not at [facility])     Status: Abnormal   Collection Time: 05/19/15  9:57 PM  Result Value Ref Range   Color, Urine ORANGE (A) YELLOW    Comment: BIOCHEMICALS MAY BE AFFECTED BY COLOR   APPearance CLOUDY (A) CLEAR   Specific Gravity, Urine 1.022 1.005 - 1.030   pH 6.5 5.0 - 8.0   Glucose, UA NEGATIVE NEGATIVE mg/dL   Hgb urine dipstick NEGATIVE NEGATIVE   Bilirubin Urine MODERATE (A) NEGATIVE   Ketones, ur NEGATIVE NEGATIVE mg/dL   Protein, ur 30 (A) NEGATIVE mg/dL   Urobilinogen, UA 4.0 (H) 0.0 - 1.0 mg/dL   Nitrite NEGATIVE NEGATIVE   Leukocytes, UA SMALL (A) NEGATIVE  Urine microscopic-add on     Status: Abnormal   Collection Time: 05/19/15  9:57 PM  Result Value Ref Range   WBC, UA 3-6 <3 WBC/hpf   RBC / HPF 0-2 <3 RBC/hpf   Casts HYALINE CASTS (A) NEGATIVE   Urine-Other MUCOUS PRESENT   MRSA PCR Screening     Status: None   Collection Time: 05/20/15 12:21 AM  Result Value Ref Range   MRSA by PCR NEGATIVE NEGATIVE    Comment:        The GeneXpert MRSA Assay (FDA approved for NASAL specimens only), is one component of a comprehensive MRSA colonization surveillance program. It is not intended to diagnose MRSA infection nor to guide or monitor treatment for MRSA infections.   Comprehensive metabolic panel     Status: Abnormal   Collection Time: 05/20/15  1:10 AM  Result Value Ref Range   Sodium 136 135 - 145 mmol/L   Potassium 3.2 (L) 3.5 - 5.1 mmol/L   Chloride 99 (L) 101 - 111 mmol/L   CO2 28 22 - 32 mmol/L   Glucose, Bld 128 (H) 65 - 99 mg/dL   BUN 14 6 - 20 mg/dL   Creatinine, Ser 0.69 0.61 - 1.24 mg/dL   Calcium 9.1 8.9 - 10.3 mg/dL   Total Protein 6.2 (L) 6.5 - 8.1 g/dL   Albumin 2.9 (L) 3.5 - 5.0 g/dL   AST 105 (H) 15 - 41 U/L   ALT 40  17 - 63 U/L   Alkaline Phosphatase 70 38 - 126 U/L   Total Bilirubin 0.9 0.3 - 1.2 mg/dL   GFR calc non Af Amer >60 >60 mL/min   GFR calc Af Amer >60 >60 mL/min    Comment: (NOTE) The eGFR has been calculated using the CKD EPI equation. This calculation has not been validated in all clinical situations. eGFR's persistently <60 mL/min signify possible Chronic Kidney Disease.    Anion gap 9 5 - 15  TSH     Status: None   Collection Time: 05/20/15  1:10 AM  Result Value Ref Range   TSH 2.939 0.350 - 4.500 uIU/mL  Troponin I-(serum)     Status: Abnormal   Collection Time: 05/20/15  1:10 AM  Result Value Ref Range   Troponin I 33.66 (HH) <0.031 ng/mL    Comment:        POSSIBLE MYOCARDIAL ISCHEMIA. SERIAL TESTING RECOMMENDED. CRITICAL RESULT CALLED TO, READ BACK  BY AND VERIFIED WITH: [facility] RN 05/20/2015 0225 JORDANS REPEATED TO VERIFY   Lipid panel     Status: Abnormal   Collection Time: 05/20/15  1:10 AM  Result Value Ref Range   Cholesterol 121 0 - 200 mg/dL   Triglycerides 61 <150 mg/dL   HDL 40 (L) >40 mg/dL   Total CHOL/HDL Ratio 3.0 RATIO   VLDL 12 0 - 40 mg/dL   LDL Cholesterol 69 0 - 99 mg/dL    Comment:        Total Cholesterol/HDL:CHD Risk Coronary Heart Disease Risk Table                     Men   Women  1/2 Average Risk   3.4   3.3  Average Risk       5.0   4.4  2 X Average Risk   9.6   7.1  3 X Average Risk  23.4   11.0        Use the calculated Patient Ratio above and the CHD Risk Table to determine the patient's CHD Risk.        ATP III CLASSIFICATION (LDL):  <100     mg/dL   Optimal  100-129  mg/dL   Near or Above                    Optimal  130-159  mg/dL   Borderline  160-189  mg/dL   High  >190     mg/dL   Very High   Heparin level (unfractionated)     Status: Abnormal   Collection Time: 05/20/15  5:04 AM  Result Value Ref Range   Heparin Unfractionated <0.10 (L) 0.30 - 0.70 IU/mL    Comment:        IF HEPARIN RESULTS ARE  BELOW EXPECTED VALUES, AND PATIENT DOSAGE HAS BEEN CONFIRMED, SUGGEST FOLLOW UP TESTING OF ANTITHROMBIN III LEVELS.   Troponin I-(serum)     Status: Abnormal   Collection Time: 05/20/15  6:31 AM  Result Value Ref Range   Troponin I 25.08 (HH) <0.031 ng/mL    Comment:        POSSIBLE MYOCARDIAL ISCHEMIA. SERIAL TESTING RECOMMENDED. REPEATED TO VERIFY CRITICAL VALUE NOTED.  VALUE IS CONSISTENT WITH PREVIOUSLY REPORTED AND CALLED VALUE.   CBC     Status: Abnormal   Collection Time: 05/20/15  6:31 AM  Result Value Ref Range   WBC 10.7 (H) 4.0 - 10.5 K/uL   RBC 4.80 4.22 - 5.81 MIL/uL   Hemoglobin 14.0 13.0 - 17.0 g/dL   HCT 41.0 39.0 - 52.0 %   MCV 85.4 78.0 - 100.0 fL   MCH 29.2 26.0 - 34.0 pg   MCHC 34.1 30.0 - 36.0 g/dL   RDW 13.5 11.5 - 15.5 %   Platelets 196 150 - 400 K/uL    Imaging: Dg Chest Port 1 View  05/19/2015   CLINICAL DATA:  Acute onset of sharp upper back pain and left arm tingling. Shortness of breath and centralized chest pain. Diaphoresis and nausea. Initial encounter.  EXAM: PORTABLE CHEST - 1 VIEW  COMPARISON:  None.  FINDINGS: The lungs are well-aerated. Mild vascular congestion is noted. Minimal bibasilar atelectasis is seen. There is no evidence of pleural effusion or pneumothorax.  The cardiomediastinal silhouette is mildly enlarged. No acute osseous abnormalities are seen.  IMPRESSION: Mild vascular congestion and mild cardiomegaly noted. Minimal bibasilar atelectasis seen.   Electronically Signed   By:  Garald Balding M.D.   On: 05/19/2015 21:35    Assessment:  1. Active Problems: 2.   NSTEMI (non-ST elevated myocardial infarction) 3.   Non-STEMI (non-ST elevated myocardial infarction) 4. Suspect acute right heart failure and possible systolic dysfunction, secondary to MI 5. Hypotension - possible cardiogenic shock  Plan:  1. Mr. Mogel has had a large MI, probably several days ago. He is having symptoms of abdominal fullness, bloating, loss of  appetite, but no further upper back or chest pain. He is hypotensive, suggesting some degree of cardiogenic shock from LV and/or RV dysfunction. Will need to d/c chlorthalidone and lisinopril for now. Continue low dose bisoprolol. Will likely need additional diuresis as BP allows.  Plan for cardiac catheterization this afternoon.  Time Spent Directly with Patient:  15 minutes  Length of Stay:  LOS: 1 day   Pixie Casino, MD, [facility] Attending Cardiologist [place] 05/20/2015, 8:36 AM

## 2015-05-20 NOTE — Progress Notes (Signed)
TR band removed, radial site level 0, no bleeding or hematoma present. Dressing applied and pt educated on wrist restrictions.

## 2015-05-20 NOTE — Interval H&P Note (Signed)
History and Physical Interval Note:  05/20/2015 9:36 AM  Ardyth HarpsHarold D Heinzman  has presented today for cardiac cath with the diagnosis of NSTEMI.  The various methods of treatment have been discussed with the patient and family. After consideration of risks, benefits and other options for treatment, the patient has consented to  Procedure(s): Left Heart Cath and Coronary Angiography (N/A) as a surgical intervention .  The patient's history has been reviewed, patient examined, no change in status, stable for surgery.  I have reviewed the patient's chart and labs.  Questions were answered to the patient's satisfaction.    Cath Lab Visit (complete for each Cath Lab visit)  Clinical Evaluation Leading to the Procedure:   ACS: Yes.    Non-ACS:    Anginal Classification: CCS IV  Anti-ischemic medical therapy: Minimal Therapy (1 class of medications)  Non-Invasive Test Results: No non-invasive testing performed  Prior CABG: No previous CABG         MCALHANY,CHRISTOPHER

## 2015-05-21 ENCOUNTER — Ambulatory Visit (HOSPITAL_COMMUNITY): Payer: Medicare Other

## 2015-05-21 ENCOUNTER — Inpatient Hospital Stay (HOSPITAL_COMMUNITY): Payer: Medicare Other

## 2015-05-21 DIAGNOSIS — I5021 Acute systolic (congestive) heart failure: Secondary | ICD-10-CM | POA: Diagnosis present

## 2015-05-21 DIAGNOSIS — I255 Ischemic cardiomyopathy: Secondary | ICD-10-CM

## 2015-05-21 DIAGNOSIS — I25110 Atherosclerotic heart disease of native coronary artery with unstable angina pectoris: Secondary | ICD-10-CM

## 2015-05-21 DIAGNOSIS — I2510 Atherosclerotic heart disease of native coronary artery without angina pectoris: Secondary | ICD-10-CM | POA: Diagnosis present

## 2015-05-21 LAB — SPIROMETRY WITH GRAPH
FEF 25-75 Post: 3.95 L/sec
FEF 25-75 Pre: 3.19 L/sec
FEF2575-%Change-Post: 23 %
FEF2575-%Pred-Post: 165 %
FEF2575-%Pred-Pre: 133 %
FEV1-%Change-Post: 5 %
FEV1-%Pred-Post: 99 %
FEV1-%Pred-Pre: 94 %
FEV1-Post: 3.11 L
FEV1-Pre: 2.97 L
FEV1FVC-%Change-Post: 4 %
FEV1FVC-%Pred-Pre: 111 %
FEV6-%Change-Post: 0 %
FEV6-%Pred-Post: 89 %
FEV6-%Pred-Pre: 89 %
FEV6-Post: 3.61 L
FEV6-Pre: 3.59 L
FEV6FVC-%Pred-Post: 106 %
FEV6FVC-%Pred-Pre: 106 %
FVC-%Change-Post: 0 %
FVC-%Pred-Post: 84 %
FVC-%Pred-Pre: 83 %
FVC-Post: 3.61 L
FVC-Pre: 3.59 L
Post FEV1/FVC ratio: 86 %
Post FEV6/FVC ratio: 100 %
Pre FEV1/FVC ratio: 83 %
Pre FEV6/FVC Ratio: 100 %

## 2015-05-21 LAB — CBC
HCT: 43.1 % (ref 39.0–52.0)
Hemoglobin: 14.8 g/dL (ref 13.0–17.0)
MCH: 29.5 pg (ref 26.0–34.0)
MCHC: 34.3 g/dL (ref 30.0–36.0)
MCV: 85.9 fL (ref 78.0–100.0)
PLATELETS: 221 10*3/uL (ref 150–400)
RBC: 5.02 MIL/uL (ref 4.22–5.81)
RDW: 13.6 % (ref 11.5–15.5)
WBC: 10.7 10*3/uL — AB (ref 4.0–10.5)

## 2015-05-21 LAB — BASIC METABOLIC PANEL
ANION GAP: 8 (ref 5–15)
BUN: 12 mg/dL (ref 6–20)
CALCIUM: 9.2 mg/dL (ref 8.9–10.3)
CO2: 28 mmol/L (ref 22–32)
Chloride: 101 mmol/L (ref 101–111)
Creatinine, Ser: 0.58 mg/dL — ABNORMAL LOW (ref 0.61–1.24)
GFR calc Af Amer: >60 mL/min (ref >60)
Glucose, Bld: 123 mg/dL — ABNORMAL HIGH (ref 65–99)
POTASSIUM: 3.3 mmol/L — AB (ref 3.5–5.1)
SODIUM: 137 mmol/L (ref 135–145)

## 2015-05-21 LAB — ABO/RH: ABO/RH(D): O POS

## 2015-05-21 LAB — B. BURGDORFI ANTIBODIES: B burgdorferi Ab IgG+IgM: <0.91 {ISR} (ref 0.00–0.90)

## 2015-05-21 LAB — HEPARIN LEVEL (UNFRACTIONATED)
Heparin Unfractionated: <0.1 IU/mL — ABNORMAL LOW (ref 0.30–0.70)
Heparin Unfractionated: >2.2 IU/mL — ABNORMAL HIGH (ref 0.30–0.70)
Heparin Unfractionated: <0.1 IU/mL — ABNORMAL LOW (ref 0.30–0.70)

## 2015-05-21 LAB — PREPARE RBC (CROSSMATCH)

## 2015-05-21 MED ORDER — PLASMA-LYTE 148 IV SOLN
INTRAVENOUS | Status: AC
  Administered 2015-05-22: 500 mL
  Filled 2015-05-21: qty 2.5

## 2015-05-21 MED ORDER — MAGNESIUM SULFATE 50 % IJ SOLN
40.0000 meq | INTRAMUSCULAR | Status: DC
  Filled 2015-05-21: qty 10

## 2015-05-21 MED ORDER — ALPRAZOLAM 0.5 MG PO TABS: 1.0000 mg | ORAL_TABLET | Freq: Three times a day (TID) | ORAL | Status: DC | PRN

## 2015-05-21 MED ORDER — ALPRAZOLAM 0.25 MG PO TABS: 0.2500 mg | ORAL_TABLET | ORAL | Status: DC | PRN

## 2015-05-21 MED ORDER — DIAZEPAM 5 MG PO TABS
5.0000 mg | ORAL_TABLET | Freq: Once | ORAL | Status: AC
  Administered 2015-05-22: 5 mg via ORAL
  Filled 2015-05-21: qty 1

## 2015-05-21 MED ORDER — DEXMEDETOMIDINE HCL IN NACL 400 MCG/100ML IV SOLN
0.1000 ug/kg/h | INTRAVENOUS | Status: AC
  Administered 2015-05-22: .2 ug/kg/h via INTRAVENOUS
  Filled 2015-05-21: qty 100

## 2015-05-21 MED ORDER — SODIUM CHLORIDE 0.9 % IV SOLN
INTRAVENOUS | Status: AC
  Administered 2015-05-22: 69.8 mL/h via INTRAVENOUS
  Filled 2015-05-21: qty 40

## 2015-05-21 MED ORDER — POTASSIUM CHLORIDE 2 MEQ/ML IV SOLN
80.0000 meq | INTRAVENOUS | Status: DC
  Filled 2015-05-21: qty 40

## 2015-05-21 MED ORDER — CHLORHEXIDINE GLUCONATE 4 % EX LIQD
60.0000 mL | Freq: Once | CUTANEOUS | Status: AC
  Administered 2015-05-22: 4 via TOPICAL
  Filled 2015-05-21: qty 60

## 2015-05-21 MED ORDER — METOPROLOL TARTRATE 12.5 MG HALF TABLET
12.5000 mg | ORAL_TABLET | Freq: Once | ORAL | Status: AC
  Administered 2015-05-22: 12.5 mg via ORAL
  Filled 2015-05-21: qty 1

## 2015-05-21 MED ORDER — ALPRAZOLAM 0.5 MG PO TABS
1.0000 mg | ORAL_TABLET | Freq: Two times a day (BID) | ORAL | Status: DC
Start: 2015-05-21 — End: 2015-05-31
  Administered 2015-05-21 – 2015-05-30 (×10): 1 mg via ORAL
  Filled 2015-05-21 (×9): qty 2
  Filled 2015-05-21: qty 4
  Filled 2015-05-21 (×6): qty 2

## 2015-05-21 MED ORDER — SODIUM CHLORIDE 0.9 % IV SOLN
INTRAVENOUS | Status: AC
  Administered 2015-05-22: 1.9 [IU]/h via INTRAVENOUS
  Filled 2015-05-21: qty 2.5

## 2015-05-21 MED ORDER — LEVOFLOXACIN IN D5W 500 MG/100ML IV SOLN
500.0000 mg | INTRAVENOUS | Status: AC
  Administered 2015-05-22: 500 mg via INTRAVENOUS
  Filled 2015-05-21: qty 100

## 2015-05-21 MED ORDER — ALBUTEROL SULFATE (2.5 MG/3ML) 0.083% IN NEBU
2.5000 mg | INHALATION_SOLUTION | Freq: Once | RESPIRATORY_TRACT | Status: AC
  Administered 2015-05-21: 2.5 mg via RESPIRATORY_TRACT

## 2015-05-21 MED ORDER — PHENYLEPHRINE HCL 10 MG/ML IJ SOLN
30.0000 ug/min | INTRAMUSCULAR | Status: AC
  Administered 2015-05-22: 40 ug/min via INTRAVENOUS
  Filled 2015-05-21: qty 2

## 2015-05-21 MED ORDER — POTASSIUM CHLORIDE CRYS ER 20 MEQ PO TBCR
40.0000 meq | EXTENDED_RELEASE_TABLET | Freq: Once | ORAL | Status: AC
  Administered 2015-05-21: 40 meq via ORAL
  Filled 2015-05-21: qty 2

## 2015-05-21 MED ORDER — DOPAMINE-DEXTROSE 3.2-5 MG/ML-% IV SOLN
0.0000 ug/kg/min | INTRAVENOUS | Status: AC
  Administered 2015-05-22: 3 ug/kg/min via INTRAVENOUS
  Filled 2015-05-21: qty 250

## 2015-05-21 MED ORDER — NITROGLYCERIN IN D5W 200-5 MCG/ML-% IV SOLN
2.0000 ug/min | INTRAVENOUS | Status: AC
  Administered 2015-05-22: 5 ug/min via INTRAVENOUS
  Filled 2015-05-21: qty 250

## 2015-05-21 MED ORDER — CHLORHEXIDINE GLUCONATE 4 % EX LIQD
60.0000 mL | Freq: Once | CUTANEOUS | Status: AC
  Administered 2015-05-21: 4 via TOPICAL
  Filled 2015-05-21: qty 60

## 2015-05-21 MED ORDER — SODIUM CHLORIDE 0.9 % IV SOLN
INTRAVENOUS | Status: DC
  Filled 2015-05-21: qty 30

## 2015-05-21 MED ORDER — TEMAZEPAM 15 MG PO CAPS: 15.0000 mg | ORAL_CAPSULE | Freq: Once | ORAL | Status: AC | PRN

## 2015-05-21 MED ORDER — BISACODYL 5 MG PO TBEC
5.0000 mg | DELAYED_RELEASE_TABLET | Freq: Once | ORAL | Status: AC
  Administered 2015-05-21: 5 mg via ORAL
  Filled 2015-05-21: qty 1

## 2015-05-21 MED ORDER — SODIUM CHLORIDE 0.9 % IV SOLN
1500.0000 mg | INTRAVENOUS | Status: AC
  Administered 2015-05-22: 1 mg via INTRAVENOUS
  Filled 2015-05-21: qty 1500

## 2015-05-21 MED ORDER — EPINEPHRINE HCL 1 MG/ML IJ SOLN
0.0000 ug/min | INTRAVENOUS | Status: DC
  Filled 2015-05-21: qty 4

## 2015-05-21 NOTE — Progress Notes (Signed)
DAILY PROGRESS NOTE  Subjective:  Feels well today. Echo yesterday showed LVEF 30-35% with multiple wall motion abnormalities. Cardiac catheterization shows multivessel CAD. BP eventually allowed diuresis yesterday, however, he was recorded net positive about 750 ml due to IV intake.  Seen by CT surgery this am and plans for CABG tomorrow.  Objective:  Temp:  [97.8 F (36.6 C)-98.5 F (36.9 C)] 98.5 F (36.9 C) (07/07 0824) Pulse Rate:  [0-153] 66 (07/07 0700) Resp:  [0-36] 19 (07/07 0800) BP: (77-194)/(36-80) 124/70 mmHg (07/07 0800) SpO2:  [0 %-98 %] 94 % (07/07 0800) Weight change:   Intake/Output from previous day: 07/06 0701 - 07/07 0700 In: 1718.6 [P.O.:710; I.V.:1008.6] Out: 965 [Urine:965]  Intake/Output from this shift:    Medications: Current Facility-Administered Medications  Medication Dose Route Frequency Provider Last Rate Last Dose  . 0.9 %  sodium chloride infusion  250 mL Intravenous PRN Burnell Blanks, MD      . acetaminophen (TYLENOL) tablet 650 mg  650 mg Oral Q4H PRN Jacolyn Reedy, MD      . ALPRAZolam Duanne Moron) tablet 0.25 mg  0.25 mg Oral TID PRN Jacolyn Reedy, MD   0.25 mg at 05/20/15 2158  . aspirin EC tablet 81 mg  81 mg Oral Daily Thayer Headings, MD      . atorvastatin (LIPITOR) tablet 80 mg  80 mg Oral q1800 Jacolyn Reedy, MD   80 mg at 05/20/15 1834  . bisoprolol (ZEBETA) tablet 2.5 mg  2.5 mg Oral Daily Jacolyn Reedy, MD   2.5 mg at 05/20/15 1245  . finasteride (PROSCAR) tablet 5 mg  5 mg Oral Daily Jacolyn Reedy, MD   5 mg at 05/20/15 1245  . furosemide (LASIX) tablet 40 mg  40 mg Oral BID Pixie Casino, MD   40 mg at 05/20/15 1834  . heparin ADULT infusion 100 units/mL (25000 units/250 mL)  1,850 Units/hr Intravenous Continuous Erenest Blank, RPH 18.5 mL/hr at 05/21/15 0700 1,850 Units/hr at 05/21/15 0700  . nitroGLYCERIN (NITROSTAT) SL tablet 0.4 mg  0.4 mg Sublingual Q5 Min x 3 PRN Jacolyn Reedy, MD      .  ondansetron [facility]) injection 4 mg  4 mg Intravenous Q6H PRN Jacolyn Reedy, MD      . sodium chloride 0.9 % injection 3 mL  3 mL Intravenous Q12H Burnell Blanks, MD   3 mL at 05/20/15 2202  . sodium chloride 0.9 % injection 3 mL  3 mL Intravenous PRN Burnell Blanks, MD        Physical Exam: General appearance: alert and no distress Neck: JVD - 3 cm above sternal notch and no carotid bruit Lungs: clear to auscultation bilaterally Heart: regular rate and rhythm, S1, S2 normal and S3 present Abdomen: soft, non-tender; bowel sounds normal; no masses,  no organomegaly and mobidly obese, ?fluid wave Extremities: edema trace sockline edema Pulses: 1+ pulses Skin: Skin color, texture, turgor normal. No rashes or lesions Neurologic: Grossly normal  Lab Results: Results for orders placed or performed during the hospital encounter of 05/19/15 (from the past 48 hour(s))  CBC     Status: Abnormal   Collection Time: 05/19/15  9:40 PM  Result Value Ref Range   WBC 12.5 (H) 4.0 - 10.5 K/uL   RBC 5.14 4.22 - 5.81 MIL/uL   Hemoglobin 15.3 13.0 - 17.0 g/dL   HCT 43.4 39.0 - 52.0 %   MCV 84.4 78.0 -  100.0 fL   MCH 29.8 26.0 - 34.0 pg   MCHC 35.3 30.0 - 36.0 g/dL   RDW 13.4 11.5 - 15.5 %   Platelets 208 150 - 400 K/uL  BNP (order ONLY if patient complains of dyspnea/SOB AND you have documented it for THIS visit)     Status: Abnormal   Collection Time: 05/19/15  9:40 PM  Result Value Ref Range   B Natriuretic Peptide 324.9 (H) 0.0 - 100.0 pg/mL  Comprehensive metabolic panel     Status: Abnormal   Collection Time: 05/19/15  9:40 PM  Result Value Ref Range   Sodium 134 (L) 135 - 145 mmol/L   Potassium 3.0 (L) 3.5 - 5.1 mmol/L   Chloride 98 (L) 101 - 111 mmol/L   CO2 26 22 - 32 mmol/L   Glucose, Bld 138 (H) 65 - 99 mg/dL   BUN 21 (H) 6 - 20 mg/dL   Creatinine, Ser 0.80 0.61 - 1.24 mg/dL   Calcium 9.0 8.9 - 10.3 mg/dL   Total Protein 6.4 (L) 6.5 - 8.1 g/dL   Albumin 3.4 (L)  3.5 - 5.0 g/dL   AST 112 (H) 15 - 41 U/L   ALT 40 17 - 63 U/L   Alkaline Phosphatase 69 38 - 126 U/L   Total Bilirubin 1.0 0.3 - 1.2 mg/dL   GFR calc non Af Amer >60 >60 mL/min   GFR calc Af Amer >60 >60 mL/min    Comment: (NOTE) The eGFR has been calculated using the CKD EPI equation. This calculation has not been validated in all clinical situations. eGFR's persistently <60 mL/min signify possible Chronic Kidney Disease.    Anion gap 10 5 - 15  Lipase, blood     Status: Abnormal   Collection Time: 05/19/15  9:40 PM  Result Value Ref Range   Lipase 15 (L) 22 - 51 U/L  Protime-INR     Status: None   Collection Time: 05/19/15  9:40 PM  Result Value Ref Range   Prothrombin Time 14.1 11.6 - 15.2 seconds   INR 1.07 0.00 - 1.49  Troponin I     Status: Abnormal   Collection Time: 05/19/15  9:40 PM  Result Value Ref Range   Troponin I 30.63 (HH) <0.031 ng/mL    Comment:        POSSIBLE MYOCARDIAL ISCHEMIA. SERIAL TESTING RECOMMENDED. REPEATED TO VERIFY CRITICAL RESULT CALLED TO, READ BACK BY AND VERIFIED WITH: A.LEDWELL,RN AT 2307 ON 05/19/15 BY W.SHEA   APTT     Status: None   Collection Time: 05/19/15  9:40 PM  Result Value Ref Range   aPTT 28 24 - 37 seconds  I-stat troponin, ED  (not at [facility], [facility])     Status: Abnormal   Collection Time: 05/19/15  9:44 PM  Result Value Ref Range   Troponin i, poc 30.94 (HH) 0.00 - 0.08 ng/mL   Comment NOTIFIED PHYSICIAN    Comment 3            Comment: Due to the release kinetics of cTnI, a negative result within the first hours of the onset of symptoms does not rule out myocardial infarction with certainty. If myocardial infarction is still suspected, repeat the test at appropriate intervals.   Urinalysis, Routine w reflex microscopic (not at [facility])     Status: Abnormal   Collection Time: 05/19/15  9:57 PM  Result Value Ref Range   Color, Urine ORANGE (A) YELLOW    Comment: BIOCHEMICALS MAY BE  AFFECTED BY COLOR   APPearance CLOUDY  (A) CLEAR   Specific Gravity, Urine 1.022 1.005 - 1.030   pH 6.5 5.0 - 8.0   Glucose, UA NEGATIVE NEGATIVE mg/dL   Hgb urine dipstick NEGATIVE NEGATIVE   Bilirubin Urine MODERATE (A) NEGATIVE   Ketones, ur NEGATIVE NEGATIVE mg/dL   Protein, ur 30 (A) NEGATIVE mg/dL   Urobilinogen, UA 4.0 (H) 0.0 - 1.0 mg/dL   Nitrite NEGATIVE NEGATIVE   Leukocytes, UA SMALL (A) NEGATIVE  Urine microscopic-add on     Status: Abnormal   Collection Time: 05/19/15  9:57 PM  Result Value Ref Range   WBC, UA 3-6 <3 WBC/hpf   RBC / HPF 0-2 <3 RBC/hpf   Casts HYALINE CASTS (A) NEGATIVE   Urine-Other MUCOUS PRESENT   MRSA PCR Screening     Status: None   Collection Time: 05/20/15 12:21 AM  Result Value Ref Range   MRSA by PCR NEGATIVE NEGATIVE    Comment:        The GeneXpert MRSA Assay (FDA approved for NASAL specimens only), is one component of a comprehensive MRSA colonization surveillance program. It is not intended to diagnose MRSA infection nor to guide or monitor treatment for MRSA infections.   Comprehensive metabolic panel     Status: Abnormal   Collection Time: 05/20/15  1:10 AM  Result Value Ref Range   Sodium 136 135 - 145 mmol/L   Potassium 3.2 (L) 3.5 - 5.1 mmol/L   Chloride 99 (L) 101 - 111 mmol/L   CO2 28 22 - 32 mmol/L   Glucose, Bld 128 (H) 65 - 99 mg/dL   BUN 14 6 - 20 mg/dL   Creatinine, Ser 0.69 0.61 - 1.24 mg/dL   Calcium 9.1 8.9 - 10.3 mg/dL   Total Protein 6.2 (L) 6.5 - 8.1 g/dL   Albumin 2.9 (L) 3.5 - 5.0 g/dL   AST 105 (H) 15 - 41 U/L   ALT 40 17 - 63 U/L   Alkaline Phosphatase 70 38 - 126 U/L   Total Bilirubin 0.9 0.3 - 1.2 mg/dL   GFR calc non Af Amer >60 >60 mL/min   GFR calc Af Amer >60 >60 mL/min    Comment: (NOTE) The eGFR has been calculated using the CKD EPI equation. This calculation has not been validated in all clinical situations. eGFR's persistently <60 mL/min signify possible Chronic Kidney Disease.    Anion gap 9 5 - 15  TSH     Status: None     Collection Time: 05/20/15  1:10 AM  Result Value Ref Range   TSH 2.939 0.350 - 4.500 uIU/mL  Troponin I-(serum)     Status: Abnormal   Collection Time: 05/20/15  1:10 AM  Result Value Ref Range   Troponin I 33.66 (HH) <0.031 ng/mL    Comment:        POSSIBLE MYOCARDIAL ISCHEMIA. SERIAL TESTING RECOMMENDED. CRITICAL RESULT CALLED TO, READ BACK BY AND VERIFIED WITH: BINGHAM,C RN 05/20/2015 0225 JORDANS REPEATED TO VERIFY   Lipid panel     Status: Abnormal   Collection Time: 05/20/15  1:10 AM  Result Value Ref Range   Cholesterol 121 0 - 200 mg/dL   Triglycerides 61 <150 mg/dL   HDL 40 (L) >40 mg/dL   Total CHOL/HDL Ratio 3.0 RATIO   VLDL 12 0 - 40 mg/dL   LDL Cholesterol 69 0 - 99 mg/dL    Comment:        Total Cholesterol/HDL:CHD Risk Coronary  Heart Disease Risk Table                     Men   Women  1/2 Average Risk   3.4   3.3  Average Risk       5.0   4.4  2 X Average Risk   9.6   7.1  3 X Average Risk  23.4   11.0        Use the calculated Patient Ratio above and the CHD Risk Table to determine the patient's CHD Risk.        ATP III CLASSIFICATION (LDL):  <100     mg/dL   Optimal  100-129  mg/dL   Near or Above                    Optimal  130-159  mg/dL   Borderline  160-189  mg/dL   High  >190     mg/dL   Very High   Heparin level (unfractionated)     Status: Abnormal   Collection Time: 05/20/15  5:04 AM  Result Value Ref Range   Heparin Unfractionated <0.10 (L) 0.30 - 0.70 IU/mL    Comment:        IF HEPARIN RESULTS ARE BELOW EXPECTED VALUES, AND PATIENT DOSAGE HAS BEEN CONFIRMED, SUGGEST FOLLOW UP TESTING OF ANTITHROMBIN III LEVELS.   Troponin I-(serum)     Status: Abnormal   Collection Time: 05/20/15  6:31 AM  Result Value Ref Range   Troponin I 25.08 (HH) <0.031 ng/mL    Comment:        POSSIBLE MYOCARDIAL ISCHEMIA. SERIAL TESTING RECOMMENDED. REPEATED TO VERIFY CRITICAL VALUE NOTED.  VALUE IS CONSISTENT WITH PREVIOUSLY REPORTED AND CALLED  VALUE.   CBC     Status: Abnormal   Collection Time: 05/20/15  6:31 AM  Result Value Ref Range   WBC 10.7 (H) 4.0 - 10.5 K/uL   RBC 4.80 4.22 - 5.81 MIL/uL   Hemoglobin 14.0 13.0 - 17.0 g/dL   HCT 41.0 39.0 - 52.0 %   MCV 85.4 78.0 - 100.0 fL   MCH 29.2 26.0 - 34.0 pg   MCHC 34.1 30.0 - 36.0 g/dL   RDW 13.5 11.5 - 15.5 %   Platelets 196 150 - 400 K/uL  Troponin I-(serum)     Status: Abnormal   Collection Time: 05/20/15 12:22 PM  Result Value Ref Range   Troponin I 19.24 (HH) <0.031 ng/mL    Comment:        POSSIBLE MYOCARDIAL ISCHEMIA. SERIAL TESTING RECOMMENDED. REPEATED TO VERIFY CRITICAL VALUE NOTED.  VALUE IS CONSISTENT WITH PREVIOUSLY REPORTED AND CALLED VALUE.   Surgical pcr screen     Status: None   Collection Time: 05/20/15  7:11 PM  Result Value Ref Range   MRSA, PCR NEGATIVE NEGATIVE   Staphylococcus aureus NEGATIVE NEGATIVE    Comment:        The Xpert SA Assay (FDA approved for NASAL specimens in patients over 56 years of age), is one component of a comprehensive surveillance program.  Test performance has been validated by [facility] for patients greater than or equal to 14 year old. It is not intended to diagnose infection nor to guide or monitor treatment.   Urinalysis, Routine w reflex microscopic (not at [facility])     Status: Abnormal   Collection Time: 05/20/15  7:11 PM  Result Value Ref Range   Color, Urine AMBER (A) YELLOW  Comment: BIOCHEMICALS MAY BE AFFECTED BY COLOR   APPearance CLEAR CLEAR   Specific Gravity, Urine 1.043 (H) 1.005 - 1.030   pH 5.5 5.0 - 8.0   Glucose, UA NEGATIVE NEGATIVE mg/dL   Hgb urine dipstick NEGATIVE NEGATIVE   Bilirubin Urine SMALL (A) NEGATIVE   Ketones, ur NEGATIVE NEGATIVE mg/dL   Protein, ur NEGATIVE NEGATIVE mg/dL   Urobilinogen, UA 2.0 (H) 0.0 - 1.0 mg/dL   Nitrite NEGATIVE NEGATIVE   Leukocytes, UA NEGATIVE NEGATIVE    Comment: MICROSCOPIC NOT DONE ON URINES WITH NEGATIVE PROTEIN, BLOOD, LEUKOCYTES,  NITRITE, OR GLUCOSE <1000 mg/dL.  Heparin level (unfractionated)     Status: Abnormal   Collection Time: 05/21/15  2:14 AM  Result Value Ref Range   Heparin Unfractionated <0.10 (L) 0.30 - 0.70 IU/mL    Comment:        IF HEPARIN RESULTS ARE BELOW EXPECTED VALUES, AND PATIENT DOSAGE HAS BEEN CONFIRMED, SUGGEST FOLLOW UP TESTING OF ANTITHROMBIN III LEVELS.   CBC     Status: Abnormal   Collection Time: 05/21/15  2:14 AM  Result Value Ref Range   WBC 10.7 (H) 4.0 - 10.5 K/uL   RBC 5.02 4.22 - 5.81 MIL/uL   Hemoglobin 14.8 13.0 - 17.0 g/dL   HCT 43.1 39.0 - 52.0 %   MCV 85.9 78.0 - 100.0 fL   MCH 29.5 26.0 - 34.0 pg   MCHC 34.3 30.0 - 36.0 g/dL   RDW 13.6 11.5 - 15.5 %   Platelets 221 150 - 400 K/uL  Basic metabolic panel     Status: Abnormal   Collection Time: 05/21/15  2:14 AM  Result Value Ref Range   Sodium 137 135 - 145 mmol/L   Potassium 3.3 (L) 3.5 - 5.1 mmol/L   Chloride 101 101 - 111 mmol/L   CO2 28 22 - 32 mmol/L   Glucose, Bld 123 (H) 65 - 99 mg/dL   BUN 12 6 - 20 mg/dL   Creatinine, Ser 0.58 (L) 0.61 - 1.24 mg/dL   Calcium 9.2 8.9 - 10.3 mg/dL   GFR calc non Af Amer >60 >60 mL/min   GFR calc Af Amer >60 >60 mL/min    Comment: (NOTE) The eGFR has been calculated using the CKD EPI equation. This calculation has not been validated in all clinical situations. eGFR's persistently <60 mL/min signify possible Chronic Kidney Disease.    Anion gap 8 5 - 15    Imaging: Dg Chest Port 1 View  05/19/2015   CLINICAL DATA:  Acute onset of sharp upper back pain and left arm tingling. Shortness of breath and centralized chest pain. Diaphoresis and nausea. Initial encounter.  EXAM: PORTABLE CHEST - 1 VIEW  COMPARISON:  None.  FINDINGS: The lungs are well-aerated. Mild vascular congestion is noted. Minimal bibasilar atelectasis is seen. There is no evidence of pleural effusion or pneumothorax.  The cardiomediastinal silhouette is mildly enlarged. No acute osseous abnormalities are  seen.  IMPRESSION: Mild vascular congestion and mild cardiomegaly noted. Minimal bibasilar atelectasis seen.   Electronically Signed   By: Garald Balding M.D.   On: 05/19/2015 21:35    Assessment:  Principal Problem:   NSTEMI (non-ST elevated myocardial infarction) Active Problems:   Morbid obesity   OSA on CPAP   Other specified hypotension   Cardiomyopathy, ischemic   CAD, multiple vessel   Plan:  Jonathan Le does indeed have an ischemic cardiomyopathy, multivessel CAD and segmental wall motion abnormalities. Currently he is chest pain  free. He will need additional diuresis today. Will supplement potassium. Plan for CABG tomorrow with Dr. Prescott Gum.  Time Spent Directly with Patient:  15 minutes  Length of Stay:  LOS: 2 days   Pixie Casino, MD, [facility] Attending Cardiologist [place] 05/21/2015, 8:40 AM

## 2015-05-21 NOTE — Progress Notes (Signed)
ANTICOAGULATION CONSULT NOTE - Follow Up Consult  Pharmacy Consult for Heparin  Indication: chest pain/ACS, multi-vessel disease awaiting CVTS consult  Allergies  Allergen Reactions  . Penicillins Hives    Patient Measurements: Height: 5\' 9"  (175.3 cm) Weight: 289 lb 0.4 oz (131.1 kg) IBW/kg (Calculated) : 70.7  Vital Signs: Temp: 98.2 F (36.8 C) (07/07 0300) Temp Source: Oral (07/07 0300) BP: 108/72 mmHg (07/07 0138) Pulse Rate: 75 (07/07 0138)  Labs:  Recent Labs  05/19/15 2140 05/20/15 0110 05/20/15 0504 05/20/15 0631 05/20/15 1222 05/21/15 0214  HGB 15.3  --   --  14.0  --  14.8  HCT 43.4  --   --  41.0  --  43.1  PLT 208  --   --  196  --  221  APTT 28  --   --   --   --   --   LABPROT 14.1  --   --   --   --   --   INR 1.07  --   --   --   --   --   HEPARINUNFRC  --   --  <0.10*  --   --  <0.10*  CREATININE 0.80 0.69  --   --   --  0.58*  TROPONINI 30.63* 33.66*  --  25.08* 19.24*  --     Estimated Creatinine Clearance: 117 mL/min (by C-G formula based on Cr of 0.58).   Assessment: Sub-therapeutic heparin level, no issues per RN, other labs as above.   Goal of Therapy:  Heparin level 0.3-0.7 units/ml Monitor platelets by anticoagulation protocol: Yes   Plan:  -Increase heparin to 1850 units/hr -1300 HL -Daily CBC/HL -Monitor for bleeding  Abran DukeLedford, Mikeila Burgen 05/21/2015,5:13 AM

## 2015-05-21 NOTE — Progress Notes (Signed)
1 Day Post-Op Procedure(s) (LRB): Left Heart Cath and Coronary Angiography (N/A) Subjective: Patient examined, cath and rcho independently reviewed Plan multivessel CABG in am Dopplers, PFTs pending Objective: Vital signs in last 24 hours: Temp:  [97.8 F (36.6 C)-98.5 F (36.9 C)] 98.2 F (36.8 C) (07/07 0300) Pulse Rate:  [0-153] 66 (07/07 0700) Cardiac Rhythm:  [-] Normal sinus rhythm (07/07 0400) Resp:  [0-36] 25 (07/07 0700) BP: (77-194)/(36-80) 84/42 mmHg (07/07 0700) SpO2:  [0 %-98 %] 91 % (07/07 0700)  Hemodynamic parameters for last 24 hours:  nsr  Intake/Output from previous day: 07/06 0701 - 07/07 0700 In: 1718.6 [P.O.:710; I.V.:1008.6] Out: 965 [Urine:965] Intake/Output this shift:       Physical Exam  General: obese WM    NAD HEENT: Normocephalic pupils equal , dentition adequate Neck: Supple without JVD, adenopathy, or bruit Chest: Clear to auscultation, symmetrical breath sounds, no rhonchi, no tenderness             or deformity Cardiovascular: Regular rate and rhythm, no murmur, no gallop, peripheral pulses             palpable in all extremities Abdomen:  Soft, nontender, no palpable mass or organomegaly Extremities: Warm, well-perfused, no clubbing cyanosis edema or tenderness,              no venous stasis changes of the legs- no hematoma R wrist Rectal/GU: Deferred Neuro: Grossly non--focal and symmetrical throughout Skin: Clean and dry without rash or ulceration   Lab Results:  Recent Labs  05/20/15 0631 05/21/15 0214  WBC 10.7* 10.7*  HGB 14.0 14.8  HCT 41.0 43.1  PLT 196 221   BMET:  Recent Labs  05/20/15 0110 05/21/15 0214  NA 136 137  K 3.2* 3.3*  CL 99* 101  CO2 28 28  GLUCOSE 128* 123*  BUN 14 12  CREATININE 0.69 0.58*  CALCIUM 9.1 9.2    PT/INR:  Recent Labs  05/19/15 2140  LABPROT 14.1  INR 1.07   ABG No results found for: PHART, HCO3, TCO2, ACIDBASEDEF, O2SAT CBG (last 3)  No results for input(s): GLUCAP in  the last 72 hours.  Assessment/Plan: S/P Procedure(s) (LRB): Left Heart Cath and Coronary Angiography (N/A) Plan CABG in am   LOS: 2 days    Kathlee Nationseter Van Trigt III 05/21/2015

## 2015-05-21 NOTE — Progress Notes (Signed)
CARDIAC REHAB PHASE I   PRE:  Rate/Rhythm: 73 SR PACs/PVCs  BP:  Supine:   Sitting: 109/58  Standing:    SaO2: 95%RA  MODE:  Ambulation: 350 ft   POST:  Rate/Rhythm: 111 ST PVCs  BP:  Supine:   Sitting: 139/51  Standing:    SaO2: 93%RA 0950-1050 Pt walked 350 ft on RA with rolling walker independently. Rocked to stand as pt has weak knees especially on right. Discussed sternal precautions and how we will need to rock to stand and to sit without use of arms. Helped pt stand by rocking. Would recommend PT to help evaluate pt for home needs after surgery as pt asking about home PT. Wrote down how to view pre op video and asked RN to assist when family here. Gave pt OHS booklet, care guide, and IS. Pt assisted with IS until he could do correctly. Can get almost to top. Will have wife available after discharge 24/7. Pt will be seen after surgery for follow up. No CP with activity but pt did state he could tell he was more tired and some SOB.   Luetta Nuttingharlene Shannell Mikkelsen, RN BSN  05/21/2015 10:45 AM

## 2015-05-21 NOTE — Progress Notes (Signed)
ANTICOAGULATION CONSULT NOTE - Follow Up Consult  Pharmacy Consult for Heparin  Indication: chest pain/ACS, multi-vessel disease   Allergies  Allergen Reactions  . Penicillins Hives    Patient Measurements: Height: 5\' 9"  (175.3 cm) Weight: 289 lb 0.4 oz (131.1 kg) IBW/kg (Calculated) : 70.7  Vital Signs: Temp: 98.5 F (36.9 C) (07/07 1600) Temp Source: Oral (07/07 1600) BP: 97/51 mmHg (07/07 1800) Pulse Rate: 76 (07/07 1800)  Labs:  Recent Labs  05/19/15 2140 05/20/15 0110  05/20/15 0631 05/20/15 1222 05/21/15 0214 05/21/15 1626 05/21/15 1918  HGB 15.3  --   --  14.0  --  14.8  --   --   HCT 43.4  --   --  41.0  --  43.1  --   --   PLT 208  --   --  196  --  221  --   --   APTT 28  --   --   --   --   --   --   --   LABPROT 14.1  --   --   --   --   --   --   --   INR 1.07  --   --   --   --   --   --   --   HEPARINUNFRC  --   --   < >  --   --  <0.10* >2.20* <0.10*  CREATININE 0.80 0.69  --   --   --  0.58*  --   --   TROPONINI 30.63* 33.66*  --  25.08* 19.24*  --   --   --   < > = values in this interval not displayed.  Estimated Creatinine Clearance: 117 mL/min (by C-G formula based on Cr of 0.58).   Assessment: Sub-therapeutic heparin level, no issues per RN, other labs as above.  Surgery planned for tomorrow  Goal of Therapy:  Heparin level 0.3-0.7 units/ml Monitor platelets by anticoagulation protocol: Yes   Plan:  Increase heparin to 2050 units/hr Follow up after surgery  Thank you. Okey RegalLisa Nasca, PharmD [phone]  05/21/2015,8:09 PM

## 2015-05-21 NOTE — Progress Notes (Signed)
Pre-op Cardiac Surgery  Carotid Findings:  Findings suggest 1-39% internal carotid artery stenosis bilaterally. Vertebral arteries are patent with antegrade flow.  Upper Extremity Right Left  Brachial Pressures 105-Triphasic 100-Triphasic  Radial Waveforms Triphasic Triphasic  Ulnar Waveforms Triphasic Triphasic  Palmar Arch (Allen's Test) Signal is unaffected with radial and ulnar compression. Signal obliterates with radial and ulnar compression.   Findings:   Pedal pulses are palpable bilaterally.   05/21/2015 4:57 PM Gertie FeyMichelle Bronc Brosseau, RVT, RDCS, RDMS

## 2015-05-22 ENCOUNTER — Encounter (HOSPITAL_COMMUNITY): Payer: Self-pay | Admitting: Certified Registered"

## 2015-05-22 ENCOUNTER — Inpatient Hospital Stay (HOSPITAL_COMMUNITY): Payer: Medicare Other | Admitting: Certified Registered"

## 2015-05-22 ENCOUNTER — Inpatient Hospital Stay (HOSPITAL_COMMUNITY): Payer: Medicare Other

## 2015-05-22 ENCOUNTER — Encounter (HOSPITAL_COMMUNITY)
Admission: EM | Disposition: A | Discharge status: Home Health | Payer: Medicare Other | Source: Home / Self Care | Attending: Cardiothoracic Surgery

## 2015-05-22 ENCOUNTER — Other Ambulatory Visit: Payer: Self-pay

## 2015-05-22 DIAGNOSIS — Z951 Presence of aortocoronary bypass graft: Secondary | ICD-10-CM

## 2015-05-22 HISTORY — PX: CORONARY ARTERY BYPASS GRAFT: SHX141

## 2015-05-22 HISTORY — DX: Presence of aortocoronary bypass graft: Z95.1

## 2015-05-22 HISTORY — PX: TEE WITHOUT CARDIOVERSION: SHX5443

## 2015-05-22 LAB — CBC
HCT: 42.7 % (ref 39.0–52.0)
HCT: 39.9 % (ref 39.0–52.0)
HCT: 38.2 % — ABNORMAL LOW (ref 39.0–52.0)
HEMOGLOBIN: 14.5 g/dL (ref 13.0–17.0)
HEMOGLOBIN: 13.7 g/dL (ref 13.0–17.0)
Hemoglobin: 13.3 g/dL (ref 13.0–17.0)
MCH: 29.5 pg (ref 26.0–34.0)
MCH: 29.4 pg (ref 26.0–34.0)
MCH: 29.4 pg (ref 26.0–34.0)
MCHC: 34 g/dL (ref 30.0–36.0)
MCHC: 34.3 g/dL (ref 30.0–36.0)
MCHC: 34.8 g/dL (ref 30.0–36.0)
MCV: 86.8 fL (ref 78.0–100.0)
MCV: 85.6 fL (ref 78.0–100.0)
MCV: 84.5 fL (ref 78.0–100.0)
Platelets: 204 10*3/uL (ref 150–400)
Platelets: 179 10*3/uL (ref 150–400)
Platelets: 183 10*3/uL (ref 150–400)
RBC: 4.92 MIL/uL (ref 4.22–5.81)
RBC: 4.66 MIL/uL (ref 4.22–5.81)
RBC: 4.52 MIL/uL (ref 4.22–5.81)
RDW: 13.6 % (ref 11.5–15.5)
RDW: 13.4 % (ref 11.5–15.5)
RDW: 13.1 % (ref 11.5–15.5)
WBC: 9.9 10*3/uL (ref 4.0–10.5)
WBC: 17.6 10*3/uL — AB (ref 4.0–10.5)
WBC: 12.8 10*3/uL — ABNORMAL HIGH (ref 4.0–10.5)

## 2015-05-22 LAB — BLOOD GAS, ARTERIAL
Acid-Base Excess: 2.7 mmol/L — ABNORMAL HIGH (ref 0.0–2.0)
Bicarbonate: 26.3 mEq/L — ABNORMAL HIGH (ref 20.0–24.0)
Drawn by: 36496
FIO2: 0.21 %
O2 Saturation: 93.9 %
Patient temperature: 98.2
TCO2: 27.5 mmol/L (ref 0–100)
pCO2 arterial: 37.6 mmHg (ref 35.0–45.0)
pH, Arterial: 7.458 — ABNORMAL HIGH (ref 7.350–7.450)
pO2, Arterial: 66 mmHg — ABNORMAL LOW (ref 80.0–100.0)

## 2015-05-22 LAB — POCT I-STAT 3, ART BLOOD GAS (G3+)
ACID-BASE DEFICIT: 1 mmol/L (ref 0.0–2.0)
ACID-BASE EXCESS: 2 mmol/L (ref 0.0–2.0)
Acid-base deficit: 1 mmol/L (ref 0.0–2.0)
BICARBONATE: 27.1 meq/L — AB (ref 20.0–24.0)
BICARBONATE: 24.2 meq/L — AB (ref 20.0–24.0)
BICARBONATE: 23.7 meq/L (ref 20.0–24.0)
Bicarbonate: 25.7 mEq/L — ABNORMAL HIGH (ref 20.0–24.0)
Bicarbonate: 26 mEq/L — ABNORMAL HIGH (ref 20.0–24.0)
O2 SAT: 92 %
O2 Saturation: 100 %
O2 Saturation: 100 %
O2 Saturation: 84 %
O2 Saturation: 92 %
PCO2 ART: 36.5 mmHg (ref 35.0–45.0)
PH ART: 7.375 (ref 7.350–7.450)
PH ART: 7.34 — AB (ref 7.350–7.450)
PH ART: 7.427 (ref 7.350–7.450)
PH ART: 7.409 (ref 7.350–7.450)
PO2 ART: 310 mmHg — AB (ref 80.0–100.0)
PO2 ART: 237 mmHg — AB (ref 80.0–100.0)
PO2 ART: 63 mmHg — AB (ref 80.0–100.0)
Patient temperature: 36.5
Patient temperature: 36.9
TCO2: 28 mmol/L (ref 0–100)
TCO2: 27 mmol/L (ref 0–100)
TCO2: 27 mmol/L (ref 0–100)
TCO2: 25 mmol/L (ref 0–100)
TCO2: 25 mmol/L (ref 0–100)
pCO2 arterial: 45.3 mmHg — ABNORMAL HIGH (ref 35.0–45.0)
pCO2 arterial: 43.8 mmHg (ref 35.0–45.0)
pCO2 arterial: 47.5 mmHg — ABNORMAL HIGH (ref 35.0–45.0)
pCO2 arterial: 37.4 mmHg (ref 35.0–45.0)
pH, Arterial: 7.385 (ref 7.350–7.450)
pO2, Arterial: 49 mmHg — ABNORMAL LOW (ref 80.0–100.0)
pO2, Arterial: 61 mmHg — ABNORMAL LOW (ref 80.0–100.0)

## 2015-05-22 LAB — POCT I-STAT, CHEM 8
BUN: 11 mg/dL (ref 6–20)
BUN: 10 mg/dL (ref 6–20)
BUN: 9 mg/dL (ref 6–20)
BUN: 9 mg/dL (ref 6–20)
BUN: 10 mg/dL (ref 6–20)
CALCIUM ION: 1.28 mmol/L (ref 1.13–1.30)
CALCIUM ION: 1.18 mmol/L (ref 1.13–1.30)
CHLORIDE: 100 mmol/L — AB (ref 101–111)
CREATININE: 0.6 mg/dL — AB (ref 0.61–1.24)
CREATININE: 0.4 mg/dL — AB (ref 0.61–1.24)
CREATININE: 0.5 mg/dL — AB (ref 0.61–1.24)
CREATININE: 0.4 mg/dL — AB (ref 0.61–1.24)
Calcium, Ion: 1.3 mmol/L (ref 1.13–1.30)
Calcium, Ion: 1.03 mmol/L — ABNORMAL LOW (ref 1.13–1.30)
Calcium, Ion: 1.23 mmol/L (ref 1.13–1.30)
Chloride: 99 mmol/L — ABNORMAL LOW (ref 101–111)
Chloride: 100 mmol/L — ABNORMAL LOW (ref 101–111)
Chloride: 100 mmol/L — ABNORMAL LOW (ref 101–111)
Chloride: 101 mmol/L (ref 101–111)
Creatinine, Ser: 0.5 mg/dL — ABNORMAL LOW (ref 0.61–1.24)
GLUCOSE: 154 mg/dL — AB (ref 65–99)
GLUCOSE: 152 mg/dL — AB (ref 65–99)
Glucose, Bld: 153 mg/dL — ABNORMAL HIGH (ref 65–99)
Glucose, Bld: 188 mg/dL — ABNORMAL HIGH (ref 65–99)
Glucose, Bld: 153 mg/dL — ABNORMAL HIGH (ref 65–99)
HCT: 43 % (ref 39.0–52.0)
HCT: 34 % — ABNORMAL LOW (ref 39.0–52.0)
HCT: 33 % — ABNORMAL LOW (ref 39.0–52.0)
HCT: 38 % — ABNORMAL LOW (ref 39.0–52.0)
HEMATOCRIT: 40 % (ref 39.0–52.0)
HEMOGLOBIN: 14.6 g/dL (ref 13.0–17.0)
HEMOGLOBIN: 11.6 g/dL — AB (ref 13.0–17.0)
HEMOGLOBIN: 11.2 g/dL — AB (ref 13.0–17.0)
Hemoglobin: 13.6 g/dL (ref 13.0–17.0)
Hemoglobin: 12.9 g/dL — ABNORMAL LOW (ref 13.0–17.0)
Potassium: 3.7 mmol/L (ref 3.5–5.1)
Potassium: 3.4 mmol/L — ABNORMAL LOW (ref 3.5–5.1)
Potassium: 4.1 mmol/L (ref 3.5–5.1)
Potassium: 3.5 mmol/L (ref 3.5–5.1)
Potassium: 3.7 mmol/L (ref 3.5–5.1)
SODIUM: 139 mmol/L (ref 135–145)
Sodium: 136 mmol/L (ref 135–145)
Sodium: 136 mmol/L (ref 135–145)
Sodium: 135 mmol/L (ref 135–145)
Sodium: 137 mmol/L (ref 135–145)
TCO2: 24 mmol/L (ref 0–100)
TCO2: 26 mmol/L (ref 0–100)
TCO2: 24 mmol/L (ref 0–100)
TCO2: 25 mmol/L (ref 0–100)
TCO2: 25 mmol/L (ref 0–100)

## 2015-05-22 LAB — HEPARIN LEVEL (UNFRACTIONATED): HEPARIN UNFRACTIONATED: 0.22 [IU]/mL — AB (ref 0.30–0.70)

## 2015-05-22 LAB — HEMOGLOBIN A1C
Hgb A1c MFr Bld: 5.8 % — ABNORMAL HIGH (ref 4.8–5.6)
Hgb A1c MFr Bld: 6 % — ABNORMAL HIGH (ref 4.8–5.6)
Mean Plasma Glucose: 120 mg/dL
Mean Plasma Glucose: 126 mg/dL

## 2015-05-22 LAB — POCT I-STAT 4, (NA,K, GLUC, HGB,HCT)
Glucose, Bld: 164 mg/dL — ABNORMAL HIGH (ref 65–99)
HCT: 41 % (ref 39.0–52.0)
HEMOGLOBIN: 13.9 g/dL (ref 13.0–17.0)
Potassium: 3.4 mmol/L — ABNORMAL LOW (ref 3.5–5.1)
Sodium: 137 mmol/L (ref 135–145)

## 2015-05-22 LAB — GLUCOSE, CAPILLARY
GLUCOSE-CAPILLARY: 115 mg/dL — AB (ref 65–99)
GLUCOSE-CAPILLARY: 129 mg/dL — AB (ref 65–99)
Glucose-Capillary: 135 mg/dL — ABNORMAL HIGH (ref 65–99)
Glucose-Capillary: 122 mg/dL — ABNORMAL HIGH (ref 65–99)
Glucose-Capillary: 131 mg/dL — ABNORMAL HIGH (ref 65–99)

## 2015-05-22 LAB — PROTIME-INR
INR: 1.48 (ref 0.00–1.49)
Prothrombin Time: 18 seconds — ABNORMAL HIGH (ref 11.6–15.2)

## 2015-05-22 LAB — BASIC METABOLIC PANEL
Anion gap: 10 (ref 5–15)
BUN: 11 mg/dL (ref 6–20)
CO2: 25 mmol/L (ref 22–32)
Calcium: 9 mg/dL (ref 8.9–10.3)
Chloride: 102 mmol/L (ref 101–111)
Creatinine, Ser: 0.5 mg/dL — ABNORMAL LOW (ref 0.61–1.24)
GFR calc Af Amer: >60 mL/min (ref >60)
GFR calc non Af Amer: >60 mL/min (ref >60)
Glucose, Bld: 116 mg/dL — ABNORMAL HIGH (ref 65–99)
Potassium: 3.1 mmol/L — ABNORMAL LOW (ref 3.5–5.1)
Sodium: 137 mmol/L (ref 135–145)

## 2015-05-22 LAB — CREATININE, SERUM
Creatinine, Ser: 0.52 mg/dL — ABNORMAL LOW (ref 0.61–1.24)
GFR calc Af Amer: >60 mL/min (ref >60)
GFR calc non Af Amer: >60 mL/min (ref >60)

## 2015-05-22 LAB — MAGNESIUM: Magnesium: 2.5 mg/dL — ABNORMAL HIGH (ref 1.7–2.4)

## 2015-05-22 LAB — HEMOGLOBIN AND HEMATOCRIT, BLOOD
HCT: 33.7 % — ABNORMAL LOW (ref 39.0–52.0)
Hemoglobin: 11.7 g/dL — ABNORMAL LOW (ref 13.0–17.0)

## 2015-05-22 LAB — PLATELET COUNT: Platelets: 190 10*3/uL (ref 150–400)

## 2015-05-22 LAB — POCT I-STAT GLUCOSE
Glucose, Bld: 148 mg/dL — ABNORMAL HIGH (ref 65–99)
Operator id: 3406

## 2015-05-22 LAB — APTT: APTT: 31 s (ref 24–37)

## 2015-05-22 SURGERY — CORONARY ARTERY BYPASS GRAFTING (CABG)
Anesthesia: General | Site: Chest

## 2015-05-22 MED ORDER — HEPARIN SODIUM (PORCINE) 1000 UNIT/ML IJ SOLN
INTRAMUSCULAR | Status: DC | PRN
  Administered 2015-05-22: 3000 [IU] via INTRAVENOUS
  Administered 2015-05-22: 5000 [IU] via INTRAVENOUS
  Administered 2015-05-22: 43000 [IU] via INTRAVENOUS

## 2015-05-22 MED ORDER — ALBUMIN HUMAN 5 % IV SOLN
250.0000 mL | INTRAVENOUS | Status: AC | PRN
  Administered 2015-05-22 – 2015-05-23 (×4): 250 mL via INTRAVENOUS
  Filled 2015-05-22 (×2): qty 250

## 2015-05-22 MED ORDER — ARTIFICIAL TEARS OP OINT
TOPICAL_OINTMENT | OPHTHALMIC | Status: DC | PRN
  Administered 2015-05-22: 1 via OPHTHALMIC

## 2015-05-22 MED ORDER — PROTAMINE SULFATE 10 MG/ML IV SOLN
INTRAVENOUS | Status: AC
  Filled 2015-05-22: qty 25

## 2015-05-22 MED ORDER — SODIUM CHLORIDE 0.9 % IV SOLN: 250.0000 mL | INTRAVENOUS | Status: DC

## 2015-05-22 MED ORDER — INSULIN REGULAR BOLUS VIA INFUSION
0.0000 [IU] | Freq: Three times a day (TID) | INTRAVENOUS | Status: DC
  Filled 2015-05-22: qty 10

## 2015-05-22 MED ORDER — MAGNESIUM SULFATE 4 GM/100ML IV SOLN
4.0000 g | Freq: Once | INTRAVENOUS | Status: AC
  Administered 2015-05-22: 4 g via INTRAVENOUS
  Filled 2015-05-22: qty 100

## 2015-05-22 MED ORDER — SODIUM CHLORIDE 0.9 % IJ SOLN
3.0000 mL | Freq: Two times a day (BID) | INTRAMUSCULAR | Status: DC
  Administered 2015-05-23: 3 mL via INTRAVENOUS
  Administered 2015-05-23 – 2015-05-24 (×2): 10 mL via INTRAVENOUS
  Administered 2015-05-24 – 2015-05-25 (×2): 3 mL via INTRAVENOUS
  Administered 2015-05-25: 10 mL via INTRAVENOUS
  Administered 2015-05-26 – 2015-05-29 (×6): 3 mL via INTRAVENOUS

## 2015-05-22 MED ORDER — FENTANYL CITRATE (PF) 250 MCG/5ML IJ SOLN
INTRAMUSCULAR | Status: AC
  Filled 2015-05-22: qty 5

## 2015-05-22 MED ORDER — PANTOPRAZOLE SODIUM 40 MG PO TBEC
40.0000 mg | DELAYED_RELEASE_TABLET | Freq: Every day | ORAL | Status: DC
  Administered 2015-05-23 – 2015-05-31 (×9): 40 mg via ORAL
  Filled 2015-05-22 (×7): qty 1

## 2015-05-22 MED ORDER — METOPROLOL TARTRATE 1 MG/ML IV SOLN
2.5000 mg | INTRAVENOUS | Status: DC | PRN
  Administered 2015-05-24: 2.5 mg via INTRAVENOUS

## 2015-05-22 MED ORDER — MIDAZOLAM HCL 5 MG/5ML IJ SOLN
INTRAMUSCULAR | Status: DC | PRN
  Administered 2015-05-22: 4 mg via INTRAVENOUS
  Administered 2015-05-22 (×3): 2 mg via INTRAVENOUS

## 2015-05-22 MED ORDER — METOPROLOL TARTRATE 12.5 MG HALF TABLET
12.5000 mg | ORAL_TABLET | Freq: Two times a day (BID) | ORAL | Status: DC
  Administered 2015-05-24: 12.5 mg via ORAL
  Filled 2015-05-22 (×9): qty 1

## 2015-05-22 MED ORDER — ACETAMINOPHEN 500 MG PO TABS
1000.0000 mg | ORAL_TABLET | Freq: Four times a day (QID) | ORAL | Status: AC
  Administered 2015-05-23 – 2015-05-27 (×16): 1000 mg via ORAL
  Filled 2015-05-22 (×20): qty 2

## 2015-05-22 MED ORDER — PROTAMINE SULFATE 10 MG/ML IV SOLN
INTRAVENOUS | Status: AC
  Filled 2015-05-22: qty 10

## 2015-05-22 MED ORDER — MILRINONE IN DEXTROSE 20 MG/100ML IV SOLN
INTRAVENOUS | Status: DC | PRN
Start: 2015-05-22 — End: 2015-05-22
  Administered 2015-05-22: 25 ug/kg/min via INTRAVENOUS

## 2015-05-22 MED ORDER — SODIUM CHLORIDE 0.9 % IV SOLN
INTRAVENOUS | Status: DC | PRN
  Administered 2015-05-22: 12:00:00 via INTRAVENOUS

## 2015-05-22 MED ORDER — PROTAMINE SULFATE 10 MG/ML IV SOLN
INTRAVENOUS | Status: DC | PRN
  Administered 2015-05-22: 550 mg via INTRAVENOUS

## 2015-05-22 MED ORDER — ACETAMINOPHEN 650 MG RE SUPP
650.0000 mg | Freq: Once | RECTAL | Status: AC
  Administered 2015-05-22: 650 mg via RECTAL

## 2015-05-22 MED ORDER — HEMOSTATIC AGENTS (NO CHARGE) OPTIME
TOPICAL | Status: DC | PRN
  Administered 2015-05-22 (×2): 1 via TOPICAL

## 2015-05-22 MED ORDER — LACTATED RINGERS IV SOLN: INTRAVENOUS | Status: DC

## 2015-05-22 MED ORDER — POTASSIUM CHLORIDE 10 MEQ/50ML IV SOLN
10.0000 meq | INTRAVENOUS | Status: AC
  Administered 2015-05-22 (×3): 10 meq via INTRAVENOUS

## 2015-05-22 MED ORDER — FENTANYL CITRATE (PF) 100 MCG/2ML IJ SOLN
INTRAMUSCULAR | Status: DC | PRN
  Administered 2015-05-22: 550 ug via INTRAVENOUS
  Administered 2015-05-22: 250 ug via INTRAVENOUS
  Administered 2015-05-22: 300 ug via INTRAVENOUS
  Administered 2015-05-22: 250 ug via INTRAVENOUS
  Administered 2015-05-22: 50 ug via INTRAVENOUS
  Administered 2015-05-22: 100 ug via INTRAVENOUS

## 2015-05-22 MED ORDER — MORPHINE SULFATE 2 MG/ML IJ SOLN
2.0000 mg | INTRAMUSCULAR | Status: DC | PRN
  Administered 2015-05-22 – 2015-05-24 (×9): 2 mg via INTRAVENOUS
  Filled 2015-05-22 (×9): qty 1

## 2015-05-22 MED ORDER — LEVALBUTEROL HCL 1.25 MG/0.5ML IN NEBU
1.2500 mg | INHALATION_SOLUTION | Freq: Four times a day (QID) | RESPIRATORY_TRACT | Status: DC
  Administered 2015-05-22 – 2015-05-27 (×21): 1.25 mg via RESPIRATORY_TRACT
  Filled 2015-05-22 (×25): qty 0.5

## 2015-05-22 MED ORDER — NITROGLYCERIN IN D5W 200-5 MCG/ML-% IV SOLN: 0.0000 ug/min | INTRAVENOUS | Status: DC

## 2015-05-22 MED ORDER — LACTATED RINGERS IV SOLN
INTRAVENOUS | Status: DC
Start: 2015-05-22 — End: 2015-05-27

## 2015-05-22 MED ORDER — CHLORHEXIDINE GLUCONATE 0.12 % MT SOLN
15.0000 mL | Freq: Two times a day (BID) | OROMUCOSAL | Status: DC
  Administered 2015-05-22 – 2015-05-23 (×3): 15 mL via OROMUCOSAL
  Filled 2015-05-22 (×4): qty 15

## 2015-05-22 MED ORDER — FENTANYL CITRATE (PF) 250 MCG/5ML IJ SOLN
INTRAMUSCULAR | Status: AC
Start: 2015-05-22 — End: 2015-05-22
  Filled 2015-05-22: qty 5

## 2015-05-22 MED ORDER — BISACODYL 10 MG RE SUPP: 10.0000 mg | Freq: Every day | RECTAL | Status: DC

## 2015-05-22 MED ORDER — PROPOFOL 10 MG/ML IV BOLUS
INTRAVENOUS | Status: AC
  Filled 2015-05-22: qty 20

## 2015-05-22 MED ORDER — ROCURONIUM BROMIDE 100 MG/10ML IV SOLN
INTRAVENOUS | Status: DC | PRN
  Administered 2015-05-22: 100 mg via INTRAVENOUS
  Administered 2015-05-22: 50 mg via INTRAVENOUS

## 2015-05-22 MED ORDER — CETYLPYRIDINIUM CHLORIDE 0.05 % MT LIQD
7.0000 mL | Freq: Four times a day (QID) | OROMUCOSAL | Status: DC
  Administered 2015-05-23 – 2015-05-25 (×9): 7 mL via OROMUCOSAL

## 2015-05-22 MED ORDER — MILRINONE IN DEXTROSE 20 MG/100ML IV SOLN
0.2500 ug/kg/min | INTRAVENOUS | Status: DC
  Filled 2015-05-22: qty 100

## 2015-05-22 MED ORDER — SODIUM CHLORIDE 0.9 % IJ SOLN
OROMUCOSAL | Status: DC | PRN
  Administered 2015-05-22 (×4): 4 mL via TOPICAL

## 2015-05-22 MED ORDER — 0.9 % SODIUM CHLORIDE (POUR BTL) OPTIME
TOPICAL | Status: DC | PRN
  Administered 2015-05-22: 6000 mL

## 2015-05-22 MED ORDER — VECURONIUM BROMIDE 10 MG IV SOLR
INTRAVENOUS | Status: DC | PRN
  Administered 2015-05-22: 10 mg via INTRAVENOUS

## 2015-05-22 MED ORDER — MILRINONE IN DEXTROSE 20 MG/100ML IV SOLN
0.1250 ug/kg/min | INTRAVENOUS | Status: AC
  Administered 2015-05-22: 0.25 ug/kg/min via INTRAVENOUS
  Administered 2015-05-23: 0.125 ug/kg/min via INTRAVENOUS
  Administered 2015-05-23: 0.25 ug/kg/min via INTRAVENOUS
  Filled 2015-05-22 (×3): qty 100

## 2015-05-22 MED ORDER — DOCUSATE SODIUM 100 MG PO CAPS
200.0000 mg | ORAL_CAPSULE | Freq: Every day | ORAL | Status: DC
Start: 2015-05-23 — End: 2015-05-31
  Administered 2015-05-23 – 2015-05-31 (×6): 200 mg via ORAL
  Filled 2015-05-22 (×9): qty 2

## 2015-05-22 MED ORDER — POTASSIUM CHLORIDE 10 MEQ/50ML IV SOLN
10.0000 meq | INTRAVENOUS | Status: AC
  Administered 2015-05-22 (×3): 10 meq via INTRAVENOUS
  Filled 2015-05-22 (×3): qty 50

## 2015-05-22 MED ORDER — METOPROLOL TARTRATE 25 MG/10 ML ORAL SUSPENSION
12.5000 mg | Freq: Two times a day (BID) | ORAL | Status: DC
  Filled 2015-05-22 (×9): qty 5

## 2015-05-22 MED ORDER — MORPHINE SULFATE 2 MG/ML IJ SOLN
1.0000 mg | INTRAMUSCULAR | Status: AC | PRN
  Filled 2015-05-22: qty 1

## 2015-05-22 MED ORDER — DEXTROSE 5 % IV SOLN
4000.0000 ug | INTRAVENOUS | Status: DC | PRN
  Administered 2015-05-22: 4 ug/min via INTRAVENOUS

## 2015-05-22 MED ORDER — OXYCODONE HCL 5 MG PO TABS
5.0000 mg | ORAL_TABLET | ORAL | Status: DC | PRN
  Administered 2015-05-23 – 2015-05-25 (×4): 10 mg via ORAL
  Administered 2015-05-26: 5 mg via ORAL
  Filled 2015-05-22: qty 1
  Filled 2015-05-22 (×4): qty 2

## 2015-05-22 MED ORDER — SODIUM CHLORIDE 0.9 % IV SOLN
INTRAVENOUS | Status: DC
  Administered 2015-05-22: 20:00:00 via INTRAVENOUS
  Filled 2015-05-22: qty 2.5

## 2015-05-22 MED ORDER — PHENYLEPHRINE HCL 10 MG/ML IJ SOLN
0.0000 ug/min | INTRAVENOUS | Status: DC
  Filled 2015-05-22 (×3): qty 2

## 2015-05-22 MED ORDER — SODIUM CHLORIDE 0.9 % IV SOLN
INTRAVENOUS | Status: DC
  Administered 2015-05-22: 14:00:00 via INTRAVENOUS

## 2015-05-22 MED ORDER — EPHEDRINE SULFATE 50 MG/ML IJ SOLN
INTRAMUSCULAR | Status: DC | PRN
  Administered 2015-05-22: 15 mg via INTRAVENOUS

## 2015-05-22 MED ORDER — DEXMEDETOMIDINE HCL IN NACL 200 MCG/50ML IV SOLN
0.0000 ug/kg/h | INTRAVENOUS | Status: DC
  Administered 2015-05-22: 0.5 ug/kg/h via INTRAVENOUS
  Administered 2015-05-22: 0.2 ug/kg/h via INTRAVENOUS
  Filled 2015-05-22 (×2): qty 50

## 2015-05-22 MED ORDER — MIDAZOLAM HCL 2 MG/2ML IJ SOLN: 2.0000 mg | INTRAMUSCULAR | Status: DC | PRN

## 2015-05-22 MED ORDER — FAMOTIDINE IN NACL 20-0.9 MG/50ML-% IV SOLN
20.0000 mg | Freq: Two times a day (BID) | INTRAVENOUS | Status: AC
  Administered 2015-05-22: 20 mg via INTRAVENOUS

## 2015-05-22 MED ORDER — PROPOFOL 10 MG/ML IV BOLUS
INTRAVENOUS | Status: DC | PRN
  Administered 2015-05-22: 50 mg via INTRAVENOUS

## 2015-05-22 MED ORDER — BISACODYL 5 MG PO TBEC
10.0000 mg | DELAYED_RELEASE_TABLET | Freq: Every day | ORAL | Status: DC
  Administered 2015-05-23 – 2015-05-31 (×4): 10 mg via ORAL
  Filled 2015-05-22 (×5): qty 2

## 2015-05-22 MED ORDER — ACETAMINOPHEN 160 MG/5ML PO SOLN: 650.0000 mg | Freq: Once | ORAL | Status: AC

## 2015-05-22 MED ORDER — ASPIRIN EC 325 MG PO TBEC
325.0000 mg | DELAYED_RELEASE_TABLET | Freq: Every day | ORAL | Status: DC
  Administered 2015-05-23 – 2015-05-30 (×8): 325 mg via ORAL
  Filled 2015-05-22 (×9): qty 1

## 2015-05-22 MED ORDER — POTASSIUM CHLORIDE CRYS ER 20 MEQ PO TBCR
40.0000 meq | EXTENDED_RELEASE_TABLET | Freq: Once | ORAL | Status: AC
  Administered 2015-05-22: 40 meq via ORAL
  Filled 2015-05-22: qty 2

## 2015-05-22 MED ORDER — SODIUM CHLORIDE 0.45 % IV SOLN
INTRAVENOUS | Status: DC | PRN
  Administered 2015-05-22: 14:00:00 via INTRAVENOUS

## 2015-05-22 MED ORDER — VANCOMYCIN HCL IN DEXTROSE 1-5 GM/200ML-% IV SOLN
1000.0000 mg | Freq: Two times a day (BID) | INTRAVENOUS | Status: AC
  Administered 2015-05-22 – 2015-05-23 (×3): 1000 mg via INTRAVENOUS
  Filled 2015-05-22 (×3): qty 200

## 2015-05-22 MED ORDER — ACETAMINOPHEN 160 MG/5ML PO SOLN
1000.0000 mg | Freq: Four times a day (QID) | ORAL | Status: AC
  Filled 2015-05-22: qty 40

## 2015-05-22 MED ORDER — MIDAZOLAM HCL 10 MG/2ML IJ SOLN
INTRAMUSCULAR | Status: AC
  Filled 2015-05-22: qty 2

## 2015-05-22 MED ORDER — ONDANSETRON HCL 4 MG/2ML IJ SOLN
4.0000 mg | Freq: Four times a day (QID) | INTRAMUSCULAR | Status: DC | PRN
  Administered 2015-05-23: 4 mg via INTRAVENOUS
  Filled 2015-05-22: qty 2

## 2015-05-22 MED ORDER — LEVOFLOXACIN IN D5W 750 MG/150ML IV SOLN
750.0000 mg | INTRAVENOUS | Status: AC
  Administered 2015-05-23: 750 mg via INTRAVENOUS
  Filled 2015-05-22: qty 150

## 2015-05-22 MED ORDER — DEXTROSE 5 % IV SOLN
0.0000 ug/min | INTRAVENOUS | Status: DC
  Filled 2015-05-22 (×2): qty 4

## 2015-05-22 MED ORDER — TRAMADOL HCL 50 MG PO TABS
50.0000 mg | ORAL_TABLET | ORAL | Status: DC | PRN
  Filled 2015-05-22: qty 1

## 2015-05-22 MED ORDER — LACTATED RINGERS IV SOLN: 500.0000 mL | Freq: Once | INTRAVENOUS | Status: AC | PRN

## 2015-05-22 MED ORDER — ROCURONIUM BROMIDE 50 MG/5ML IV SOLN
INTRAVENOUS | Status: AC
  Filled 2015-05-22: qty 2

## 2015-05-22 MED ORDER — VANCOMYCIN HCL IN DEXTROSE 1-5 GM/200ML-% IV SOLN: 1000.0000 mg | Freq: Once | INTRAVENOUS | Status: DC

## 2015-05-22 MED ORDER — NOREPINEPHRINE BITARTRATE 1 MG/ML IV SOLN
2.0000 ug/min | INTRAVENOUS | Status: AC
  Administered 2015-05-22: 10 ug/min via INTRAVENOUS
  Administered 2015-05-22: 12 ug/min via INTRAVENOUS
  Administered 2015-05-23: 10 ug/min via INTRAVENOUS
  Filled 2015-05-22 (×3): qty 4

## 2015-05-22 MED ORDER — ASPIRIN 81 MG PO CHEW
324.0000 mg | CHEWABLE_TABLET | Freq: Every day | ORAL | Status: DC
  Administered 2015-05-31: 324 mg
  Filled 2015-05-22: qty 4

## 2015-05-22 MED ORDER — LACTATED RINGERS IV SOLN
INTRAVENOUS | Status: DC | PRN
  Administered 2015-05-22 (×2): via INTRAVENOUS

## 2015-05-22 MED ORDER — SODIUM CHLORIDE 0.9 % IJ SOLN: 3.0000 mL | INTRAMUSCULAR | Status: DC | PRN

## 2015-05-22 MED FILL — Heparin Sodium (Porcine) Inj 1000 Unit/ML: INTRAMUSCULAR | Qty: 10 | Status: AC

## 2015-05-22 MED FILL — Sodium Chloride IV Soln 0.9%: INTRAVENOUS | Qty: 2000 | Status: AC

## 2015-05-22 MED FILL — Lidocaine HCl IV Inj 20 MG/ML: INTRAVENOUS | Qty: 5 | Status: AC

## 2015-05-22 MED FILL — Heparin Sodium (Porcine) Inj 1000 Unit/ML: INTRAMUSCULAR | Qty: 30 | Status: AC

## 2015-05-22 MED FILL — Sodium Bicarbonate IV Soln 8.4%: INTRAVENOUS | Qty: 50 | Status: AC

## 2015-05-22 MED FILL — Potassium Chloride Inj 2 mEq/ML: INTRAVENOUS | Qty: 40 | Status: AC

## 2015-05-22 MED FILL — Magnesium Sulfate Inj 50%: INTRAMUSCULAR | Qty: 10 | Status: AC

## 2015-05-22 MED FILL — Mannitol IV Soln 20%: INTRAVENOUS | Qty: 500 | Status: AC

## 2015-05-22 MED FILL — Electrolyte-R (PH 7.4) Solution: INTRAVENOUS | Qty: 3000 | Status: AC

## 2015-05-22 SURGICAL SUPPLY — 99 items
ADAPTER CARDIO PERF ANTE/RETRO (ADAPTER) ×4 IMPLANT
ADPR PRFSN 84XANTGRD RTRGD (ADAPTER) ×2
APL SKNCLS STERI-STRIP NONHPOA (GAUZE/BANDAGES/DRESSINGS) ×2
BAG DECANTER FOR FLEXI CONT (MISCELLANEOUS) ×4 IMPLANT
BANDAGE ELASTIC 4 VELCRO ST LF (GAUZE/BANDAGES/DRESSINGS) ×4 IMPLANT
BANDAGE ELASTIC 6 VELCRO ST LF (GAUZE/BANDAGES/DRESSINGS) ×4 IMPLANT
BASKET HEART  (ORDER IN 25'S) (MISCELLANEOUS) ×1
BASKET HEART (ORDER IN 25'S) (MISCELLANEOUS) ×1
BASKET HEART (ORDER IN 25S) (MISCELLANEOUS) ×2 IMPLANT
BENZOIN TINCTURE PRP APPL 2/3 (GAUZE/BANDAGES/DRESSINGS) ×2 IMPLANT
BLADE STERNUM SYSTEM 6 (BLADE) ×4 IMPLANT
BLADE SURG 12 STRL SS (BLADE) ×4 IMPLANT
BLADE SURG ROTATE 9660 (MISCELLANEOUS) IMPLANT
BNDG GAUZE ELAST 4 BULKY (GAUZE/BANDAGES/DRESSINGS) ×4 IMPLANT
CANISTER SUCTION 2500CC (MISCELLANEOUS) ×4 IMPLANT
CANNULA ARTERIAL NVNT 3/8 22FR (MISCELLANEOUS) ×2 IMPLANT
CANNULA GUNDRY RCSP 15FR (MISCELLANEOUS) ×4 IMPLANT
CATH CPB KIT VANTRIGT (MISCELLANEOUS) ×4 IMPLANT
CATH ROBINSON RED A/P 18FR (CATHETERS) ×12 IMPLANT
CATH THORACIC 36FR RT ANG (CATHETERS) ×4 IMPLANT
CLOSURE WOUND 1/2 X4 (GAUZE/BANDAGES/DRESSINGS) ×1
COVER SURGICAL LIGHT HANDLE (MISCELLANEOUS) ×4 IMPLANT
CRADLE DONUT ADULT HEAD (MISCELLANEOUS) ×4 IMPLANT
DRAIN CHANNEL 32F RND 10.7 FF (WOUND CARE) ×4 IMPLANT
DRAPE CARDIOVASCULAR INCISE (DRAPES) ×4
DRAPE SLUSH/WARMER DISC (DRAPES) ×4 IMPLANT
DRAPE SRG 135X102X78XABS (DRAPES) ×2 IMPLANT
DRSG AQUACEL AG ADV 3.5X14 (GAUZE/BANDAGES/DRESSINGS) ×4 IMPLANT
ELECT BLADE 4.0 EZ CLEAN MEGAD (MISCELLANEOUS) ×4
ELECT BLADE 6.5 EXT (BLADE) ×4 IMPLANT
ELECT CAUTERY BLADE 6.4 (BLADE) ×4 IMPLANT
ELECT REM PT RETURN 9FT ADLT (ELECTROSURGICAL) ×8
ELECTRODE BLDE 4.0 EZ CLN MEGD (MISCELLANEOUS) ×2 IMPLANT
ELECTRODE REM PT RTRN 9FT ADLT (ELECTROSURGICAL) ×4 IMPLANT
GAUZE SPONGE 4X4 12PLY STRL (GAUZE/BANDAGES/DRESSINGS) ×8 IMPLANT
GLOVE BIO SURGEON STRL SZ 6 (GLOVE) ×4 IMPLANT
GLOVE BIO SURGEON STRL SZ 6.5 (GLOVE) ×2 IMPLANT
GLOVE BIO SURGEON STRL SZ7.5 (GLOVE) ×12 IMPLANT
GLOVE BIO SURGEON STRL SZ8 (GLOVE) ×2 IMPLANT
GLOVE BIO SURGEONS STRL SZ 6.5 (GLOVE) ×2
GLOVE BIOGEL PI IND STRL 6.5 (GLOVE) IMPLANT
GLOVE BIOGEL PI IND STRL 7.0 (GLOVE) IMPLANT
GLOVE BIOGEL PI INDICATOR 6.5 (GLOVE) ×4
GLOVE BIOGEL PI INDICATOR 7.0 (GLOVE) ×16
GOWN STRL REUS W/ TWL LRG LVL3 (GOWN DISPOSABLE) ×8 IMPLANT
GOWN STRL REUS W/TWL LRG LVL3 (GOWN DISPOSABLE) ×40
HEMOSTAT POWDER SURGIFOAM 1G (HEMOSTASIS) ×14 IMPLANT
HEMOSTAT SURGICEL 2X14 (HEMOSTASIS) ×4 IMPLANT
INSERT FOGARTY XLG (MISCELLANEOUS) ×2 IMPLANT
KIT BASIN OR (CUSTOM PROCEDURE TRAY) ×4 IMPLANT
KIT ROOM TURNOVER OR (KITS) ×4 IMPLANT
KIT SUCTION CATH 14FR (SUCTIONS) ×4 IMPLANT
KIT VASOVIEW W/TROCAR VH 2000 (KITS) ×4 IMPLANT
LEAD PACING MYOCARDI (MISCELLANEOUS) ×4 IMPLANT
MARKER GRAFT CORONARY BYPASS (MISCELLANEOUS) ×12 IMPLANT
NS IRRIG 1000ML POUR BTL (IV SOLUTION) ×20 IMPLANT
PACK OPEN HEART (CUSTOM PROCEDURE TRAY) ×4 IMPLANT
PAD ARMBOARD 7.5X6 YLW CONV (MISCELLANEOUS) ×12 IMPLANT
PAD ELECT DEFIB RADIOL ZOLL (MISCELLANEOUS) ×4 IMPLANT
PENCIL BUTTON HOLSTER BLD 10FT (ELECTRODE) ×4 IMPLANT
PUNCH AORTIC ROTATE  4.5MM 8IN (MISCELLANEOUS) ×2 IMPLANT
PUNCH AORTIC ROTATE 4.0MM (MISCELLANEOUS) IMPLANT
PUNCH AORTIC ROTATE 4.5MM 8IN (MISCELLANEOUS) IMPLANT
PUNCH AORTIC ROTATE 5MM 8IN (MISCELLANEOUS) IMPLANT
STRIP CLOSURE SKIN 1/2X4 (GAUZE/BANDAGES/DRESSINGS) ×1 IMPLANT
SURGIFLO W/THROMBIN 8M KIT (HEMOSTASIS) ×6 IMPLANT
SUT BONE WAX W31G (SUTURE) ×4 IMPLANT
SUT MNCRL AB 4-0 PS2 18 (SUTURE) ×2 IMPLANT
SUT PROLENE 3 0 SH DA (SUTURE) IMPLANT
SUT PROLENE 3 0 SH1 36 (SUTURE) ×2 IMPLANT
SUT PROLENE 4 0 RB 1 (SUTURE) ×4
SUT PROLENE 4 0 SH DA (SUTURE) ×4 IMPLANT
SUT PROLENE 4-0 RB1 .5 CRCL 36 (SUTURE) ×2 IMPLANT
SUT PROLENE 5 0 C 1 36 (SUTURE) IMPLANT
SUT PROLENE 6 0 C 1 30 (SUTURE) ×8 IMPLANT
SUT PROLENE 6 0 CC (SUTURE) ×16 IMPLANT
SUT PROLENE 8 0 BV175 6 (SUTURE) ×6 IMPLANT
SUT PROLENE BLUE 7 0 (SUTURE) ×6 IMPLANT
SUT SILK  1 MH (SUTURE)
SUT SILK 1 MH (SUTURE) IMPLANT
SUT SILK 2 0 SH CR/8 (SUTURE) ×2 IMPLANT
SUT SILK 3 0 SH CR/8 (SUTURE) IMPLANT
SUT STEEL 6MS V (SUTURE) ×8 IMPLANT
SUT STEEL SZ 6 DBL 3X14 BALL (SUTURE) ×4 IMPLANT
SUT VIC AB 1 CTX 36 (SUTURE) ×8
SUT VIC AB 1 CTX36XBRD ANBCTR (SUTURE) ×4 IMPLANT
SUT VIC AB 2-0 CT1 27 (SUTURE) ×8
SUT VIC AB 2-0 CT1 TAPERPNT 27 (SUTURE) IMPLANT
SUT VIC AB 2-0 CTX 27 (SUTURE) IMPLANT
SUT VIC AB 3-0 X1 27 (SUTURE) IMPLANT
SUTURE E-PAK OPEN HEART (SUTURE) ×4 IMPLANT
SYSTEM SAHARA CHEST DRAIN ATS (WOUND CARE) ×4 IMPLANT
TAPE CLOTH SURG 4X10 WHT LF (GAUZE/BANDAGES/DRESSINGS) ×2 IMPLANT
TOWEL OR 17X24 6PK STRL BLUE (TOWEL DISPOSABLE) ×8 IMPLANT
TOWEL OR 17X26 10 PK STRL BLUE (TOWEL DISPOSABLE) ×8 IMPLANT
TRAY FOLEY IC TEMP SENS 16FR (CATHETERS) ×4 IMPLANT
TUBING INSUFFLATION (TUBING) ×4 IMPLANT
UNDERPAD 30X30 INCONTINENT (UNDERPADS AND DIAPERS) ×4 IMPLANT
WATER STERILE IRR 1000ML POUR (IV SOLUTION) ×8 IMPLANT

## 2015-05-22 NOTE — Transfer of Care (Signed)
Immediate Anesthesia Transfer of Care Note  Patient: Jonathan Le  Procedure(s) Performed: Procedure(s) with comments: CORONARY ARTERY BYPASS GRAFTING (CABG), ON PUMP, TIMES THREE, USING LEFT INTERNAL MAMMARY ARTERY, RIGHT GREATER SAPHENOUS VEIN HARVESTED ENDOSCOPICALLY (N/A) - LIMA-LAD, SVG-OM, SVG-PD TRANSESOPHAGEAL ECHOCARDIOGRAM (TEE) (N/A)  Patient Location: SICU  Anesthesia Type:General  Level of Consciousness: sedated, unresponsive and Patient remains intubated per anesthesia plan  Airway & Oxygen Therapy: Patient remains intubated per anesthesia plan and Patient placed on Ventilator (see vital sign flow sheet for setting)  Post-op Assessment: Report given to RN and Post -op Vital signs reviewed and stable  Post vital signs: Reviewed and stable  Last Vitals:  Filed Vitals:   05/22/15 0400  BP: 121/64  Pulse: 61  Temp:   Resp:     Complications: No apparent anesthesia complications

## 2015-05-22 NOTE — Progress Notes (Signed)
RT Note: RT attempted right radial aline above cath site. Unable to thread catheter, RN notified. RT will continue to monitor.

## 2015-05-22 NOTE — Procedures (Addendum)
Extubation Procedure Note  Patient Details:   Name: Jonathan Le DOB: 03/19/1946 MRN: [id number]   Airway Documentation:  Airway 8 mm (Active)  Secured at (cm) 23 cm 05/22/2015  8:00 PM  Measured From Lips 05/22/2015  8:00 PM  Secured Location Right 05/22/2015  8:00 PM  Secured By Pink Tape 05/22/2015  8:00 PM  Site Condition Dry 05/22/2015  8:00 PM    Evaluation  O2 sats: stable throughout Complications: No apparent complications Patient did tolerate procedure well. Bilateral Breath Sounds: Clear, Diminished Suctioning: Airway Yes, pt able to vocalize name and cough to clear secretions.  NIF -40 and VC 5-750L positive cuff leak  Tacy Learnurriff, Paisely Brick E 05/22/2015, 10:01 PM

## 2015-05-22 NOTE — Progress Notes (Signed)
  Echocardiogram Echocardiogram Transesophageal has been performed.  Arvil ChacoFoster, Sigmund Morera 05/22/2015, 9:08 AM

## 2015-05-22 NOTE — Care Management (Signed)
Important Message  Patient Details  Name: Jonathan Le MRN: [id number] Date of Birth: 01/31/1946   Medicare Important Message Given:  Yes-second notification given    Kyla BalzarineShealy, Maximilian Tallo Abena 05/22/2015, 9:50 AM

## 2015-05-22 NOTE — Progress Notes (Addendum)
ANTICOAGULATION CONSULT NOTE - Follow Up Consult  Pharmacy Consult for Heparin  Indication: chest pain/ACS, multi-vessel disease awaiting CVTS consult  Allergies  Allergen Reactions  . Penicillins Hives    Patient Measurements: Height: 5\' 9"  (175.3 cm) Weight: 289 lb 0.4 oz (131.1 kg) IBW/kg (Calculated) : 70.7  Vital Signs: Temp: 98.1 F (36.7 C) (07/07 2000) Temp Source: Oral (07/07 2000) BP: 121/64 mmHg (07/08 0400) Pulse Rate: 61 (07/08 0400)  Labs:  Recent Labs  05/19/15 2140 05/20/15 0110  05/20/15 0631 05/20/15 1222 05/21/15 0214 05/21/15 1626 05/21/15 1918 05/22/15 0414  HGB 15.3  --   --  14.0  --  14.8  --   --  14.5  HCT 43.4  --   --  41.0  --  43.1  --   --  42.7  PLT 208  --   --  196  --  221  --   --  204  APTT 28  --   --   --   --   --   --   --   --   LABPROT 14.1  --   --   --   --   --   --   --   --   INR 1.07  --   --   --   --   --   --   --   --   HEPARINUNFRC  --   --   < >  --   --  <0.10* >2.20* <0.10* 0.22*  CREATININE 0.80 0.69  --   --   --  0.58*  --   --  0.50*  TROPONINI 30.63* 33.66*  --  25.08* 19.24*  --   --   --   --   < > = values in this interval not displayed.  Estimated Creatinine Clearance: 117 mL/min (by C-G formula based on Cr of 0.5).   Assessment: Sub-therapeutic heparin level, no issues per RN, other labs as above.   Goal of Therapy:  Heparin level 0.3-0.7 units/ml Monitor platelets by anticoagulation protocol: Yes   Plan:  -Increase heparin to 2250 units/hr -F/U post-CABG today  Jonathan Le, Jonathan Le 05/22/2015,4:48 AM

## 2015-05-22 NOTE — Brief Op Note (Signed)
05/19/2015 - 05/22/2015  11:16 AM  PATIENT:  Jonathan Le  69 y.o. male  PRE-OPERATIVE DIAGNOSIS:  CAD  POST-OPERATIVE DIAGNOSIS:  CAD  PROCEDURE:   CORONARY ARTERY BYPASS GRAFTING x 3 (LIMA-LAD, SVG-OM, SVG-PD), ENDOSCOPIC GREATER SAPHENOUS VEIN HARVEST RIGHT LEG  SURGEON:  Kerin PernaPeter Van Trigt, MD  ASSISTANT: Coral CeoGina Collins, PA-C  ANESTHESIA:   general  PATIENT CONDITION:  ICU - intubated and hemodynamically stable.  PRE-OPERATIVE WEIGHT: 131 kg

## 2015-05-22 NOTE — OR Nursing (Signed)
40 minute call to SICU at 1157.

## 2015-05-22 NOTE — Progress Notes (Signed)
Changed settings per rapid wean protocol 

## 2015-05-22 NOTE — Progress Notes (Signed)
Patient ID: Jonathan HarpsHarold D Mood, male   DOB: 07/12/1946, 69 y.o.   MRN: [id number] EVENING ROUNDS NOTE :     [address]       [place] [id number]             [phone]                 Day of Surgery Procedure(s) (LRB): CORONARY ARTERY BYPASS GRAFTING (CABG), ON PUMP, TIMES THREE, USING LEFT INTERNAL MAMMARY ARTERY, RIGHT GREATER SAPHENOUS VEIN HARVESTED ENDOSCOPICALLY (N/A) TRANSESOPHAGEAL ECHOCARDIOGRAM (TEE) (N/A)  Total Length of Stay:  LOS: 3 days  BP 98/54 mmHg  Pulse 82  Temp(Src) 96.8 F (36 C) (Core (Comment))  Resp 14  Ht 5\' 9"  (1.753 m)  Wt 289 lb 0.4 oz (131.1 kg)  BMI 42.66 kg/m2  SpO2 93%  .Intake/Output      07/07 0701 - 07/08 0700 07/08 0701 - 07/09 0700   P.O. 120    I.V. (mL/kg) 385 (2.9) 4021 (30.7)   Blood  1297   NG/GT  20   IV Piggyback  750   Total Intake(mL/kg) 505 (3.9) 6088 (46.4)   Urine (mL/kg/hr) 1675 (0.5) 1860 (1.3)   Emesis/NG output  0 (0)   Stool     Blood  1625 (1.1)   Chest Tube  50 (0)   Total Output 1675 3535   Net -1170 +2553          . sodium chloride 20 mL/hr at 05/22/15 1340  . [START ON 05/23/2015] sodium chloride    . sodium chloride 20 mL/hr at 05/22/15 1332  . dexmedetomidine 0.3 mcg/kg/hr (05/22/15 1610)  . insulin (NOVOLIN-R) infusion 4.1 Units/hr (05/22/15 1749)  . lactated ringers 10 mL/hr at 05/22/15 1338  . lactated ringers 20 mL/hr at 05/22/15 1336  . milrinone 0.25 mcg/kg/min (05/22/15 1530)  . nitroGLYCERIN Stopped (05/22/15 1546)  . norepinephrine (LEVOPHED) Adult infusion 12 mcg/min (05/22/15 1753)  . phenylephrine (NEO-SYNEPHRINE) Adult infusion       Lab Results  Component Value Date   WBC 17.6* 05/22/2015   HGB 13.9 05/22/2015   HCT 41.0 05/22/2015   PLT 179 05/22/2015   GLUCOSE 164* 05/22/2015   CHOL 121 05/20/2015   TRIG 61 05/20/2015   HDL 40* 05/20/2015   LDLCALC 69 05/20/2015   ALT 40 05/20/2015   AST 105* 05/20/2015   NA 137 05/22/2015   K 3.4* 05/22/2015   CL 100*  05/22/2015   CREATININE 0.50* 05/22/2015   BUN 9 05/22/2015   CO2 25 05/22/2015   TSH 2.939 05/20/2015   INR 1.48 05/22/2015   HGBA1C 6.0* 05/21/2015   On levophed 12 mcg Not bleeding Awake and alert still on vent  Aline out , had been in rt brachial Try to replace on left to help with weaning multiple drips Delight OvensEdward B Morty Ortwein MD  Beeper [phone] Office [phone] 05/22/2015 5:54 PM

## 2015-05-22 NOTE — Progress Notes (Signed)
RT attempted maximum amount of times to place arterial line on left radial, all unsucessful.  RN aware.  Will continue to monitor.

## 2015-05-22 NOTE — Anesthesia Procedure Notes (Signed)
Procedure Name: Intubation Date/Time: 05/22/2015 7:54 AM Performed by: Lanell MatarBAKER, Kongmeng Santoro M Pre-anesthesia Checklist: Patient identified, Emergency Drugs available, Suction available, Patient being monitored and Timeout performed Patient Re-evaluated:Patient Re-evaluated prior to inductionOxygen Delivery Method: Circle system utilized Preoxygenation: Pre-oxygenation with 100% oxygen Intubation Type: IV induction Ventilation: Mask ventilation without difficulty and Oral airway inserted - appropriate to patient size Laryngoscope Size: Mac and 4 Grade View: Grade I Tube type: Oral Tube size: 8.0 mm Number of attempts: 1 Airway Equipment and Method: Stylet Placement Confirmation: ETT inserted through vocal cords under direct vision,  positive ETCO2,  CO2 detector and breath sounds checked- equal and bilateral Secured at: 23 cm Tube secured with: Tape Dental Injury: Teeth and Oropharynx as per pre-operative assessment

## 2015-05-22 NOTE — Anesthesia Preprocedure Evaluation (Signed)
Anesthesia Evaluation  Patient identified by MRN, date of birth, ID band Patient awake    Reviewed: Allergy & Precautions, NPO status , Patient's Chart, lab work & pertinent test results, reviewed documented beta blocker date and time   Airway Mallampati: II  TM Distance: >3 FB Neck ROM: Full    Dental no notable dental hx.    Pulmonary sleep apnea ,  breath sounds clear to auscultation  Pulmonary exam normal       Cardiovascular hypertension, Pt. on home beta blockers and Pt. on medications + CAD, + Past MI and +CHF Normal cardiovascular examRhythm:Regular Rate:Normal     Neuro/Psych Anxiety negative neurological ROS  negative psych ROS   GI/Hepatic negative GI ROS, Neg liver ROS,   Endo/Other  Morbid obesity  Renal/GU negative Renal ROS     Musculoskeletal  (+) Arthritis -,   Abdominal (+) + obese,   Peds  Hematology negative hematology ROS (+)   Anesthesia Other Findings   Reproductive/Obstetrics negative OB ROS                             Anesthesia Physical Anesthesia Plan  ASA: IV  Anesthesia Plan: General   Post-op Pain Management:    Induction: Intravenous  Airway Management Planned: Oral ETT  Additional Equipment: Arterial line, TEE, CVP, PA Cath and Ultrasound Guidance Line Placement  Intra-op Plan:   Post-operative Plan: Post-operative intubation/ventilation  Informed Consent: I have reviewed the patients History and Physical, chart, labs and discussed the procedure including the risks, benefits and alternatives for the proposed anesthesia with the patient or authorized representative who has indicated his/her understanding and acceptance.   Dental advisory given  Plan Discussed with: CRNA  Anesthesia Plan Comments:         Anesthesia Quick Evaluation

## 2015-05-22 NOTE — Progress Notes (Signed)
The patient was examined and preop studies reviewed. There has been no change from the prior exam and the patient is ready for surgery.  plan CABG on Baron HamperH [place] today

## 2015-05-22 NOTE — Anesthesia Postprocedure Evaluation (Signed)
Anesthesia Post Note  Patient: Jonathan HarpsHarold D Blanchet  Procedure(s) Performed: Procedure(s) (LRB): CORONARY ARTERY BYPASS GRAFTING (CABG), ON PUMP, TIMES THREE, USING LEFT INTERNAL MAMMARY ARTERY, RIGHT GREATER SAPHENOUS VEIN HARVESTED ENDOSCOPICALLY (N/A) TRANSESOPHAGEAL ECHOCARDIOGRAM (TEE) (N/A)  Anesthesia type: General  Patient location: SICU  Post pain: Pain level controlled  Post assessment: Post-op Vital signs reviewed  Last Vitals: BP 98/54 mmHg  Pulse 82  Temp(Src) 36 C (Core (Comment))  Resp 14  Ht 5\' 9"  (1.753 m)  Wt 289 lb 0.4 oz (131.1 kg)  BMI 42.66 kg/m2  SpO2 93%  Post vital signs: Reviewed  Level of consciousness: sedated/intubated  Complications: No apparent anesthesia complications

## 2015-05-22 NOTE — OR Nursing (Signed)
Off pump call to SICU charge nurse at 1145.

## 2015-05-23 ENCOUNTER — Inpatient Hospital Stay (HOSPITAL_COMMUNITY): Payer: Medicare Other

## 2015-05-23 LAB — GLUCOSE, CAPILLARY
GLUCOSE-CAPILLARY: 109 mg/dL — AB (ref 65–99)
GLUCOSE-CAPILLARY: 110 mg/dL — AB (ref 65–99)
GLUCOSE-CAPILLARY: 151 mg/dL — AB (ref 65–99)
GLUCOSE-CAPILLARY: 138 mg/dL — AB (ref 65–99)
Glucose-Capillary: 112 mg/dL — ABNORMAL HIGH (ref 65–99)
Glucose-Capillary: 112 mg/dL — ABNORMAL HIGH (ref 65–99)
Glucose-Capillary: 116 mg/dL — ABNORMAL HIGH (ref 65–99)
Glucose-Capillary: 137 mg/dL — ABNORMAL HIGH (ref 65–99)
Glucose-Capillary: 140 mg/dL — ABNORMAL HIGH (ref 65–99)
Glucose-Capillary: 163 mg/dL — ABNORMAL HIGH (ref 65–99)
Glucose-Capillary: 96 mg/dL (ref 65–99)
Glucose-Capillary: 99 mg/dL (ref 65–99)
Glucose-Capillary: 89 mg/dL (ref 65–99)
Glucose-Capillary: 143 mg/dL — ABNORMAL HIGH (ref 65–99)
Glucose-Capillary: 121 mg/dL — ABNORMAL HIGH (ref 65–99)

## 2015-05-23 LAB — BASIC METABOLIC PANEL
Anion gap: 7 (ref 5–15)
BUN: 8 mg/dL (ref 6–20)
CALCIUM: 8.1 mg/dL — AB (ref 8.9–10.3)
CO2: 26 mmol/L (ref 22–32)
Chloride: 102 mmol/L (ref 101–111)
Creatinine, Ser: 0.5 mg/dL — ABNORMAL LOW (ref 0.61–1.24)
GFR calc Af Amer: >60 mL/min (ref >60)
GFR calc non Af Amer: >60 mL/min (ref >60)
GLUCOSE: 145 mg/dL — AB (ref 65–99)
POTASSIUM: 4 mmol/L (ref 3.5–5.1)
Sodium: 135 mmol/L (ref 135–145)

## 2015-05-23 LAB — CBC
HCT: 37.3 % — ABNORMAL LOW (ref 39.0–52.0)
HEMATOCRIT: 35.2 % — AB (ref 39.0–52.0)
Hemoglobin: 12 g/dL — ABNORMAL LOW (ref 13.0–17.0)
Hemoglobin: 12.8 g/dL — ABNORMAL LOW (ref 13.0–17.0)
MCH: 28.8 pg (ref 26.0–34.0)
MCH: 29.4 pg (ref 26.0–34.0)
MCHC: 34.1 g/dL (ref 30.0–36.0)
MCHC: 34.3 g/dL (ref 30.0–36.0)
MCV: 84.6 fL (ref 78.0–100.0)
MCV: 85.6 fL (ref 78.0–100.0)
Platelets: 167 10*3/uL (ref 150–400)
Platelets: 162 10*3/uL (ref 150–400)
RBC: 4.16 MIL/uL — ABNORMAL LOW (ref 4.22–5.81)
RBC: 4.36 MIL/uL (ref 4.22–5.81)
RDW: 13.1 % (ref 11.5–15.5)
RDW: 13.2 % (ref 11.5–15.5)
WBC: 11.6 10*3/uL — ABNORMAL HIGH (ref 4.0–10.5)
WBC: 12.6 10*3/uL — ABNORMAL HIGH (ref 4.0–10.5)

## 2015-05-23 LAB — POCT I-STAT, CHEM 8
BUN: 11 mg/dL (ref 6–20)
CREATININE: 0.4 mg/dL — AB (ref 0.61–1.24)
Calcium, Ion: 1.24 mmol/L (ref 1.13–1.30)
Chloride: 99 mmol/L — ABNORMAL LOW (ref 101–111)
GLUCOSE: 156 mg/dL — AB (ref 65–99)
HCT: 36 % — ABNORMAL LOW (ref 39.0–52.0)
HEMOGLOBIN: 12.2 g/dL — AB (ref 13.0–17.0)
Potassium: 4.2 mmol/L (ref 3.5–5.1)
Sodium: 134 mmol/L — ABNORMAL LOW (ref 135–145)
TCO2: 23 mmol/L (ref 0–100)

## 2015-05-23 LAB — PREPARE FRESH FROZEN PLASMA
Unit division: 0
Unit division: 0

## 2015-05-23 LAB — POCT I-STAT 3, ART BLOOD GAS (G3+)
Bicarbonate: 23.7 mEq/L (ref 20.0–24.0)
O2 Saturation: 92 %
PCO2 ART: 35.6 mmHg (ref 35.0–45.0)
PO2 ART: 64 mmHg — AB (ref 80.0–100.0)
Patient temperature: 37.6
TCO2: 25 mmol/L (ref 0–100)
pH, Arterial: 7.433 (ref 7.350–7.450)

## 2015-05-23 LAB — CREATININE, SERUM
CREATININE: 0.6 mg/dL — AB (ref 0.61–1.24)
GFR calc Af Amer: >60 mL/min (ref >60)
GFR calc non Af Amer: >60 mL/min (ref >60)

## 2015-05-23 LAB — MAGNESIUM
Magnesium: 1.9 mg/dL (ref 1.7–2.4)
Magnesium: 2 mg/dL (ref 1.7–2.4)

## 2015-05-23 MED ORDER — INSULIN DETEMIR 100 UNIT/ML ~~LOC~~ SOLN
10.0000 [IU] | Freq: Once | SUBCUTANEOUS | Status: AC
  Administered 2015-05-23: 10 [IU] via SUBCUTANEOUS
  Filled 2015-05-23: qty 0.1

## 2015-05-23 MED ORDER — DORZOLAMIDE HCL 2 % OP SOLN
1.0000 [drp] | Freq: Three times a day (TID) | OPHTHALMIC | Status: DC
  Administered 2015-05-23 – 2015-05-31 (×22): 1 [drp] via OPHTHALMIC
  Filled 2015-05-23 (×3): qty 10

## 2015-05-23 MED ORDER — DOPAMINE-DEXTROSE 3.2-5 MG/ML-% IV SOLN
0.0000 ug/kg/min | INTRAVENOUS | Status: DC
  Administered 2015-05-23: 3 ug/kg/min via INTRAVENOUS
  Filled 2015-05-23: qty 250

## 2015-05-23 MED ORDER — INSULIN ASPART 100 UNIT/ML ~~LOC~~ SOLN
0.0000 [IU] | SUBCUTANEOUS | Status: DC
  Administered 2015-05-23 (×2): 2 [IU] via SUBCUTANEOUS

## 2015-05-23 MED ORDER — MILRINONE IN DEXTROSE 20 MG/100ML IV SOLN
0.1250 ug/kg/min | INTRAVENOUS | Status: AC
  Administered 2015-05-24: 0.125 ug/kg/min via INTRAVENOUS
  Filled 2015-05-23: qty 100

## 2015-05-23 MED ORDER — INSULIN ASPART 100 UNIT/ML ~~LOC~~ SOLN
0.0000 [IU] | SUBCUTANEOUS | Status: DC
  Administered 2015-05-23 – 2015-05-24 (×4): 2 [IU] via SUBCUTANEOUS
  Administered 2015-05-24: 4 [IU] via SUBCUTANEOUS
  Administered 2015-05-24 – 2015-05-25 (×2): 2 [IU] via SUBCUTANEOUS
  Administered 2015-05-25: 8 [IU] via SUBCUTANEOUS
  Administered 2015-05-25 (×2): 2 [IU] via SUBCUTANEOUS

## 2015-05-23 NOTE — Progress Notes (Signed)
EKG CRITICAL VALUE     12 lead EKG performed.  Critical value noted.  Gwenlyn Foundyan Wilkin, RN notified.   Ihor GullyBrown, Kerwin Augustus M, CCT 05/23/2015 7:59 AM

## 2015-05-23 NOTE — Progress Notes (Signed)
Patient ID: Ardyth HarpsHarold D Osberg, male   DOB: 05/20/1946, 69 y.o.   MRN: [id number] EVENING ROUNDS NOTE :     [address]       [place] [id number]             [phone]                 1 Day Post-Op Procedure(s) (LRB): CORONARY ARTERY BYPASS GRAFTING (CABG), ON PUMP, TIMES THREE, USING LEFT INTERNAL MAMMARY ARTERY, RIGHT GREATER SAPHENOUS VEIN HARVESTED ENDOSCOPICALLY (N/A) TRANSESOPHAGEAL ECHOCARDIOGRAM (TEE) (N/A)  Total Length of Stay:  LOS: 4 days  BP 120/67 mmHg  Pulse 87  Temp(Src) 98.4 F (36.9 C) (Oral)  Resp 21  Ht 5\' 9"  (1.753 m)  Wt 310 lb 9.6 oz (140.887 kg)  BMI 45.85 kg/m2  SpO2 95%  .Intake/Output      07/08 0701 - 07/09 0700 07/09 0701 - 07/10 0700   P.O.  450   I.V. (mL/kg) 5533 (39.3) 534.1 (3.8)   Blood 1297    NG/GT 20    IV Piggyback 1650 350   Total Intake(mL/kg) 8500 (60.3) 1334.1 (9.5)   Urine (mL/kg/hr) 2585 (0.8) 445 (0.3)   Emesis/NG output 0 (0)    Blood 1625 (0.5)    Chest Tube 300 (0.1) 70 (0)   Total Output 4510 515   Net +3990 +819.1          . sodium chloride 20 mL/hr at 05/22/15 1340  . sodium chloride    . sodium chloride 20 mL/hr at 05/22/15 1332  . dexmedetomidine Stopped (05/23/15 0200)  . lactated ringers 10 mL/hr at 05/22/15 1338  . lactated ringers 10 mL/hr at 05/22/15 1800  . nitroGLYCERIN Stopped (05/22/15 1546)  . phenylephrine (NEO-SYNEPHRINE) Adult infusion       Lab Results  Component Value Date   WBC 11.6* 05/23/2015   HGB 12.2* 05/23/2015   HCT 36.0* 05/23/2015   PLT 167 05/23/2015   GLUCOSE 156* 05/23/2015   CHOL 121 05/20/2015   TRIG 61 05/20/2015   HDL 40* 05/20/2015   LDLCALC 69 05/20/2015   ALT 40 05/20/2015   AST 105* 05/20/2015   NA 134* 05/23/2015   K 4.2 05/23/2015   CL 99* 05/23/2015   CREATININE 0.40* 05/23/2015   BUN 11 05/23/2015   CO2 26 05/23/2015   TSH 2.939 05/20/2015   INR 1.48 05/22/2015   HGBA1C 6.0* 05/21/2015   Up to chair Milrinone  and dopamine still going  low dose, insulin drip off  Up to chair    Delight OvensEdward B Kerin Cecchi MD  Beeper [phone] Office [phone] 05/23/2015 6:48 PM

## 2015-05-23 NOTE — Op Note (Signed)
NAME:  Jonathan Le, Mamadou               ACCOUNT NO.:  [id number]  MEDICAL RECORD NO.:  [id number]  LOCATION:  2S03C                        FACILITY:  [facility]  PHYSICIAN:  Kerin PernaPeter Van Trigt, M.D.  DATE OF BIRTH:  1946-10-10  DATE OF PROCEDURE:  05/22/2015 DATE OF DISCHARGE:                              OPERATIVE REPORT   OPERATIONS: 1. Coronary artery bypass grafting x3 (left internal mammary artery to     left anterior descending artery, saphenous vein graft to obtuse     marginal, saphenous vein graft to posterior descending). 2. Endoscopic harvest of right leg greater saphenous vein.  SURGEON:  Kerin PernaPeter Van Trigt, M.D.  ASSISTANT:  Coral CeoGina Collins, PA-C  PREOPERATIVE DIAGNOSES:  Unstable angina, non-ST elevation myocardial infarction, severe multivessel coronary artery disease.  POSTOPERATIVE DIAGNOSES:  Unstable angina, non-ST elevation myocardial infarction, severe multivessel coronary artery disease.  ANESTHESIA:  General.  INDICATIONS:  The patient is an obese 69 year old Caucasian male, who presented with symptoms of unstable angina and was found to have positive cardiac enzymes.  He underwent cardiac catheterization and echocardiogram.  Echocardiogram showed anterior septal hypokinesia.  The coronary arteriogram showed total occlusion of LAD, 90% stenosis of the distal right coronary artery and 75-80% stenosis of the circumflex marginal.  Ejection fraction was mild-to-moderately reduced.  Surgical coronary revascularization was recommended.  Prior to surgery, I examined the patient in the CCU and reviewed the results of the cardiac catheterization and echocardiogram with the patient and his family.  I discussed the indications and expected benefits of coronary artery bypass surgery for treatment of his severe coronary artery disease.  I discussed the major aspects of the operation including the use of general anesthesia and cardiopulmonary bypass, the location of the surgical  incisions, and the expected postoperative hospital recovery.  I discussed with the patient the risks to him of coronary artery bypass surgery including risks of MI, stroke, bleeding, blood transfusion requirement, ventilator dependence, postoperative pulmonary problems including pneumonia and pleural effusion, infection, and death.  After reviewing these issues, he demonstrated his understanding and agreed to proceed with surgery under what I felt was an informed consent.  OPERATIVE FINDINGS: 1. Difficult exposure of the heart because of severe morbid obesity. 2. Short aorta. 3. Dilated veins, but usable. 4. No packed cell transfusions required for this surgery.  DESCRIPTION OF PROCEDURE:  The patient was brought to the operating room and placed supine on the operating room table where general anesthesia was induced under invasive hemodynamic monitoring.  A transesophageal echoprobe was placed by the anesthesiologist.  The patient was prepped and draped as a sterile field.  A proper time-out was performed.  A sternal incision was made as the saphenous vein was harvested endoscopically from the right leg.  The sternal retractor was placed using the deep blades due to the patient's obese body habitus.  The pericardium was opened and suspended.  Pursestrings were placed in the ascending aorta and right atrium.  After heparin was administered and the ACT was documented as being therapeutic, the patient was cannulated and placed on a cardiopulmonary bypass.  The coronary arteries were identified for grafting.  The anterior LV wall was inflamed, erythematous and edematous consistent  with recent MI.  The mammary artery and vein grafts were prepared for the distal anastomoses and cardioplegia cannulas were replaced for both antegrade and retrograde cold blood cardioplegia.  The patient was cooled to 32 degrees and aortic crossclamp was applied.  The 1.2 liters of cold cardioplegia  was delivered in split doses between the antegrade aortic and retrograde coronary sinus catheters.  There was good cardioplegic arrest and septal temperature dropped less than 15 degrees.  Cardioplegia was delivered every 20 minutes while the crossclamp was in place.  The distal coronary anastomoses were performed.  The first distal anastomosis was to the posterior descending.  This had a 90% proximal stenosis.  A reverse saphenous vein was sewn end-to-side with running 7- 0 Prolene with good flow through the graft.  Cardioplegia was redosed.  The second distal anastomosis was to the OM2 branch of the circumflex. This had a 75-80% stenosis.  It was a 1.5-mm vessel.  A reverse saphenous vein was sewn end-to-side with running 7-0 Prolene with good flow through the graft.  Cardioplegia was redosed.  The third distal anastomosis was to the distal third of the LAD.  It was occluded more proximally.  It had diffuse disease.  It was a 1.5-mm vessel.  The left IMA pedicle was brought through an opening in the left lateral pericardium, was brought down onto the LAD and sewn end-to-side with running 8-0 Prolene.  There was good flow through the anastomosis after briefly releasing the pedicle bulldog on the mammary artery.  The bulldog was reapplied and the pedicle was secured to the epicardium with 6-0 Prolene.  Cardioplegia was redosed.  While the crossclamp was in place, two proximal coronary anastomoses were performed using a 4.5-mm punch and running 6-0 Prolene.  Air was vented from the coronary arteries with a dose of retrograde warm blood cardioplegia and the crossclamp was removed.  The heart was resumed in a spontaneous rhythm.  The vein grafts were opened and deaired and each had good flow.  Hemostasis was documented at the proximal and distal anastomoses.  The patient was rewarmed and reperfused.  Temporary pacing wires were applied.  The lungs were expanded and ventilator was  resumed.  The patient was then weaned from cardiopulmonary bypass on low-dose dopamine and milrinone.  Cardiac output was excellent.  LV function showed significant improvement on the transesophageal echo.  Protamine was administered without adverse reaction.  The cannulas were removed.  The mediastinum was irrigated with warm saline.  The superior pericardial fat was closed over the aorta.  An anterior mediastinal and left pleural chest tube were placed and brought out through separate incisions.  The sternum was closed with interrupted steel wire.  The pectoralis fascia was closed with #1 Vicryl.  The subcutaneous and skin layers were closed in running Vicryl and sterile dressings were applied.  Total cardiopulmonary bypass time was 120 minutes.     Kerin Perna, M.D.     PV/MEDQ  D:  05/22/2015  T:  05/23/2015  Job:  [id number]

## 2015-05-23 NOTE — Progress Notes (Addendum)
Patient ID: Jonathan Le, male   DOB: 08/26/1946, 69 y.o.   MRN: [id number] TCTS DAILY ICU PROGRESS NOTE                   [address]            [place] [address]          [phone]   1 Day Post-Op Procedure(s) (LRB): CORONARY ARTERY BYPASS GRAFTING (CABG), ON PUMP, TIMES THREE, USING LEFT INTERNAL MAMMARY ARTERY, RIGHT GREATER SAPHENOUS VEIN HARVESTED ENDOSCOPICALLY (N/A) TRANSESOPHAGEAL ECHOCARDIOGRAM (TEE) (N/A)  Total Length of Stay:  LOS: 4 days   Subjective: Extubated , awake and alert  Objective: Vital signs in last 24 hours: Temp:  [96.3 F (35.7 C)-99.9 F (37.7 C)] 99.9 F (37.7 C) (07/09 0645) Pulse Rate:  [72-107] 91 (07/09 0645) Cardiac Rhythm:  [-] Normal sinus rhythm (07/09 0400) Resp:  [10-36] 22 (07/09 0645) BP: (82-124)/(45-70) 112/56 mmHg (07/09 0645) SpO2:  [86 %-98 %] 96 % (07/09 0801) Arterial Line BP: (97-240)/(39-236) 120/62 mmHg (07/08 1430) FiO2 (%):  [40 %-80 %] 40 % (07/08 2000) Weight:  [310 lb 9.6 oz (140.887 kg)] 310 lb 9.6 oz (140.887 kg) (07/09 0500)  Filed Weights   05/19/15 2202 05/20/15 0115 05/23/15 0500  Weight: 295 lb (133.811 kg) 289 lb 0.4 oz (131.1 kg) 310 lb 9.6 oz (140.887 kg)    Weight change:    Hemodynamic parameters for last 24 hours: PAP: (24-61)/(7-38) 35/14 mmHg CO:  [8 L/min-9.8 L/min] 8 L/min CI:  [3.3 L/min/m2-4 L/min/m2] 3.3 L/min/m2  Intake/Output from previous day: 07/08 0701 - 07/09 0700 In: 8420.3 [I.V.:5453.3; IONGE:9528; NG/GT:20; IV Piggyback:1650] Out: 4510 [Urine:2585; Blood:1625; Chest Tube:300]  Intake/Output this shift:    Current Meds: Scheduled Meds: . acetaminophen  1,000 mg Oral 4 times per day   Or  . acetaminophen (TYLENOL) oral liquid 160 mg/5 mL  1,000 mg Per Tube 4 times per day  . ALPRAZolam  1 mg Oral BID  . antiseptic oral rinse  7 mL Mouth Rinse QID  . aspirin EC  325 mg Oral Daily   Or  . aspirin  324 mg Per Tube Daily  . atorvastatin  80 mg Oral q1800   . bisacodyl  10 mg Oral Daily   Or  . bisacodyl  10 mg Rectal Daily  . chlorhexidine  15 mL Mouth Rinse BID  . docusate sodium  200 mg Oral Daily  . famotidine (PEPCID) IV  20 mg Intravenous Q12H  . finasteride  5 mg Oral Daily  . insulin aspart  0-24 Units Subcutaneous 6 times per day  . levalbuterol  1.25 mg Nebulization 4 times per day  . levofloxacin (LEVAQUIN) IV  750 mg Intravenous Q24H  . metoprolol tartrate  12.5 mg Oral BID   Or  . metoprolol tartrate  12.5 mg Per Tube BID  . pantoprazole  40 mg Oral QAC breakfast  . sodium chloride  3 mL Intravenous Q12H  . vancomycin  1,000 mg Intravenous Q12H   Continuous Infusions: . sodium chloride 20 mL/hr at 05/22/15 1340  . sodium chloride    . sodium chloride 20 mL/hr at 05/22/15 1332  . dexmedetomidine Stopped (05/23/15 0200)  . lactated ringers 10 mL/hr at 05/22/15 1338  . lactated ringers 10 mL/hr at 05/22/15 1800  . milrinone 0.25 mcg/kg/min (05/23/15 0100)  . nitroGLYCERIN Stopped (05/22/15 1546)  . norepinephrine (LEVOPHED) Adult infusion 6 mcg/min (05/23/15 0415)  . phenylephrine (NEO-SYNEPHRINE) Adult  infusion     PRN Meds:.sodium chloride, metoprolol, midazolam, morphine injection, ondansetron (ZOFRAN) IV, oxyCODONE, sodium chloride, traMADol  General appearance: alert and cooperative Neurologic: intact Heart: regular rate and rhythm, S1, S2 normal, no murmur, click, rub or gallop Lungs: diminished breath sounds bibasilar Abdomen: soft, non-tender; bowel sounds normal; no masses,  no organomegaly Extremities: extremities normal, atraumatic, no cyanosis or edema and Homans sign is negative, no sign of DVT Wound: sternum intact Mild numbness right small finger, circulation intact and good grip  Lab Results: CBC:  Recent Labs  05/22/15 1900 05/22/15 1909 05/23/15 0400  WBC 12.8*  --  11.6*  HGB 13.3 12.9* 12.0*  HCT 38.2* 38.0* 35.2*  PLT 183  --  167   BMET:  Recent Labs  05/22/15 0414  05/22/15 1909  05/23/15 0400  NA 137  < > 139 135  K 3.1*  < > 3.7 4.0  CL 102  < > 101 102  CO2 25  --   --  26  GLUCOSE 116*  < > 153* 145*  BUN 11  < > 10 8  CREATININE 0.50*  < > 0.40* 0.50*  CALCIUM 9.0  --   --  8.1*  < > = values in this interval not displayed.  PT/INR:   Recent Labs  05/22/15 1323  LABPROT 18.0*  INR 1.48   Radiology: Dg Chest Port 1 View  05/22/2015   CLINICAL DATA:  Status post CABG today.  EXAM: PORTABLE CHEST - 1 VIEW  COMPARISON:  Single view of the chest 05/19/2015.  FINDINGS: Right IJ approach Swan-Ganz catheter is in place with the tip in the right main pulmonary artery. An NG tube is identified with tip just in the stomach. Endotracheal tube tip is 1.8 cm above the carina. Left chest tube is in place. Lung volumes are low but the lungs appear clear. No pneumothorax. There is cardiomegaly.  IMPRESSION: ET tube tip is 1.8 cm above the carina. The tube could be withdrawn 1-2 cm.  Negative for pneumothorax the left chest tube in place. No acute abnormality.   Electronically Signed   By: Drusilla Kannerhomas  Dalessio M.D.   On: 05/22/2015 13:36     Assessment/Plan: S/P Procedure(s) (LRB): CORONARY ARTERY BYPASS GRAFTING (CABG), ON PUMP, TIMES THREE, USING LEFT INTERNAL MAMMARY ARTERY, RIGHT GREATER SAPHENOUS VEIN HARVESTED ENDOSCOPICALLY (N/A) TRANSESOPHAGEAL ECHOCARDIOGRAM (TEE) (N/A) Mobilize Diuresis d/c tubes/lines Continue foley due to bladder outlet obstruction, strict I&O, patient in ICU and urinary output monitoring See progression orders Expected Acute  Blood - loss Anemia Aline clotted last pm Wean off drips   Delight Ovensdward B Dhrithi Riche 05/23/2015 8:23 AM

## 2015-05-24 ENCOUNTER — Inpatient Hospital Stay (HOSPITAL_COMMUNITY): Payer: Medicare Other

## 2015-05-24 DIAGNOSIS — I4891 Unspecified atrial fibrillation: Secondary | ICD-10-CM

## 2015-05-24 LAB — BASIC METABOLIC PANEL
Anion gap: 6 (ref 5–15)
Anion gap: 7 (ref 5–15)
BUN: 13 mg/dL (ref 6–20)
BUN: 17 mg/dL (ref 6–20)
CO2: 27 mmol/L (ref 22–32)
CO2: 26 mmol/L (ref 22–32)
Calcium: 8.3 mg/dL — ABNORMAL LOW (ref 8.9–10.3)
Calcium: 8.5 mg/dL — ABNORMAL LOW (ref 8.9–10.3)
Chloride: 98 mmol/L — ABNORMAL LOW (ref 101–111)
Chloride: 97 mmol/L — ABNORMAL LOW (ref 101–111)
Creatinine, Ser: 0.54 mg/dL — ABNORMAL LOW (ref 0.61–1.24)
Creatinine, Ser: 0.61 mg/dL (ref 0.61–1.24)
GFR calc Af Amer: >60 mL/min (ref >60)
GFR calc Af Amer: >60 mL/min (ref >60)
GFR calc non Af Amer: >60 mL/min (ref >60)
GFR calc non Af Amer: >60 mL/min (ref >60)
Glucose, Bld: 164 mg/dL — ABNORMAL HIGH (ref 65–99)
Glucose, Bld: 189 mg/dL — ABNORMAL HIGH (ref 65–99)
Potassium: 4.3 mmol/L (ref 3.5–5.1)
Potassium: 4.2 mmol/L (ref 3.5–5.1)
Sodium: 131 mmol/L — ABNORMAL LOW (ref 135–145)
Sodium: 130 mmol/L — ABNORMAL LOW (ref 135–145)

## 2015-05-24 LAB — CBC
HCT: 35.2 % — ABNORMAL LOW (ref 39.0–52.0)
HCT: 35.6 % — ABNORMAL LOW (ref 39.0–52.0)
Hemoglobin: 12 g/dL — ABNORMAL LOW (ref 13.0–17.0)
Hemoglobin: 12 g/dL — ABNORMAL LOW (ref 13.0–17.0)
MCH: 29.5 pg (ref 26.0–34.0)
MCH: 29.2 pg (ref 26.0–34.0)
MCHC: 34.1 g/dL (ref 30.0–36.0)
MCHC: 33.7 g/dL (ref 30.0–36.0)
MCV: 86.5 fL (ref 78.0–100.0)
MCV: 86.6 fL (ref 78.0–100.0)
Platelets: 119 10*3/uL — ABNORMAL LOW (ref 150–400)
Platelets: 194 10*3/uL (ref 150–400)
RBC: 4.07 MIL/uL — ABNORMAL LOW (ref 4.22–5.81)
RBC: 4.11 MIL/uL — ABNORMAL LOW (ref 4.22–5.81)
RDW: 13.4 % (ref 11.5–15.5)
RDW: 13.3 % (ref 11.5–15.5)
WBC: 13.1 10*3/uL — ABNORMAL HIGH (ref 4.0–10.5)
WBC: 13 10*3/uL — ABNORMAL HIGH (ref 4.0–10.5)

## 2015-05-24 LAB — GLUCOSE, CAPILLARY
GLUCOSE-CAPILLARY: 119 mg/dL — AB (ref 65–99)
GLUCOSE-CAPILLARY: 127 mg/dL — AB (ref 65–99)
GLUCOSE-CAPILLARY: 127 mg/dL — AB (ref 65–99)
GLUCOSE-CAPILLARY: 198 mg/dL — AB (ref 65–99)
Glucose-Capillary: 115 mg/dL — ABNORMAL HIGH (ref 65–99)
Glucose-Capillary: 137 mg/dL — ABNORMAL HIGH (ref 65–99)

## 2015-05-24 LAB — HEPATIC FUNCTION PANEL
ALT: 22 U/L (ref 17–63)
AST: 23 U/L (ref 15–41)
Albumin: 2.4 g/dL — ABNORMAL LOW (ref 3.5–5.0)
Alkaline Phosphatase: 67 U/L (ref 38–126)
Bilirubin, Direct: 0.2 mg/dL (ref 0.1–0.5)
Indirect Bilirubin: 0.5 mg/dL (ref 0.3–0.9)
Total Bilirubin: 0.7 mg/dL (ref 0.3–1.2)
Total Protein: 5.1 g/dL — ABNORMAL LOW (ref 6.5–8.1)

## 2015-05-24 LAB — TSH: TSH: 0.212 u[IU]/mL — ABNORMAL LOW (ref 0.350–4.500)

## 2015-05-24 LAB — MAGNESIUM: Magnesium: 2 mg/dL (ref 1.7–2.4)

## 2015-05-24 MED ORDER — DIGOXIN 0.25 MG/ML IJ SOLN: 0.2500 mg | Freq: Once | INTRAMUSCULAR | Status: DC

## 2015-05-24 MED ORDER — AMIODARONE HCL 200 MG PO TABS
200.0000 mg | ORAL_TABLET | Freq: Two times a day (BID) | ORAL | Status: DC
  Administered 2015-05-25 (×2): 200 mg via ORAL
  Filled 2015-05-24 (×5): qty 1

## 2015-05-24 MED ORDER — ENOXAPARIN SODIUM 40 MG/0.4ML ~~LOC~~ SOLN
40.0000 mg | SUBCUTANEOUS | Status: DC
Start: 2015-05-24 — End: 2015-05-31
  Administered 2015-05-24 – 2015-05-31 (×8): 40 mg via SUBCUTANEOUS
  Filled 2015-05-24 (×8): qty 0.4

## 2015-05-24 MED ORDER — NOREPINEPHRINE BITARTRATE 1 MG/ML IV SOLN
2.0000 ug/min | INTRAVENOUS | Status: DC
  Administered 2015-05-24: 3 ug/min via INTRAVENOUS
  Administered 2015-05-25: 14 ug/min via INTRAVENOUS
  Filled 2015-05-24 (×2): qty 4

## 2015-05-24 MED ORDER — AMIODARONE HCL IN DEXTROSE 360-4.14 MG/200ML-% IV SOLN: 60.0000 mg/h | INTRAVENOUS | Status: DC

## 2015-05-24 MED ORDER — AMIODARONE IV BOLUS ONLY 150 MG/100ML
150.0000 mg | Freq: Once | INTRAVENOUS | Status: AC
Start: 2015-05-24 — End: 2015-05-24
  Administered 2015-05-24: 150 mg via INTRAVENOUS

## 2015-05-24 MED ORDER — AMIODARONE HCL IN DEXTROSE 360-4.14 MG/200ML-% IV SOLN
60.0000 mg/h | INTRAVENOUS | Status: AC
  Administered 2015-05-24 (×3): 60 mg/h via INTRAVENOUS
  Filled 2015-05-24: qty 200

## 2015-05-24 MED ORDER — DOPAMINE-DEXTROSE 3.2-5 MG/ML-% IV SOLN: 0.0000 ug/kg/min | INTRAVENOUS | Status: DC

## 2015-05-24 MED ORDER — AMIODARONE HCL 200 MG PO TABS: 200.0000 mg | ORAL_TABLET | Freq: Every day | ORAL | Status: DC

## 2015-05-24 MED ORDER — DEXTROSE 5 % IV SOLN
5.0000 mg/h | INTRAVENOUS | Status: DC
  Administered 2015-05-24: 5 mg/h via INTRAVENOUS
  Filled 2015-05-24: qty 100

## 2015-05-24 MED ORDER — AMIODARONE LOAD VIA INFUSION: 150.0000 mg | Freq: Once | INTRAVENOUS | Status: DC

## 2015-05-24 MED ORDER — ENOXAPARIN SODIUM 30 MG/0.3ML ~~LOC~~ SOLN: 30.0000 mg | SUBCUTANEOUS | Status: DC

## 2015-05-24 MED ORDER — DIGOXIN 0.25 MG/ML IJ SOLN: 0.2500 mg | INTRAMUSCULAR | Status: DC

## 2015-05-24 MED ORDER — AMIODARONE HCL IN DEXTROSE 360-4.14 MG/200ML-% IV SOLN
30.0000 mg/h | INTRAVENOUS | Status: DC
  Administered 2015-05-25 (×2): 30 mg/h via INTRAVENOUS
  Filled 2015-05-24 (×6): qty 200

## 2015-05-24 MED ORDER — AMIODARONE LOAD VIA INFUSION
150.0000 mg | Freq: Once | INTRAVENOUS | Status: AC
  Administered 2015-05-24: 150 mg via INTRAVENOUS

## 2015-05-24 MED ORDER — AMIODARONE LOAD VIA INFUSION
150.0000 mg | Freq: Once | INTRAVENOUS | Status: AC
  Administered 2015-05-24: 150 mg via INTRAVENOUS
  Filled 2015-05-24: qty 83.34

## 2015-05-24 MED ORDER — FUROSEMIDE 10 MG/ML IJ SOLN
40.0000 mg | Freq: Once | INTRAMUSCULAR | Status: AC
  Administered 2015-05-24: 40 mg via INTRAVENOUS

## 2015-05-24 MED ORDER — AMIODARONE HCL IN DEXTROSE 360-4.14 MG/200ML-% IV SOLN: 30.0000 mg/h | INTRAVENOUS | Status: DC

## 2015-05-24 MED ORDER — DIGOXIN 0.25 MG/ML IJ SOLN
0.2500 mg | INTRAMUSCULAR | Status: AC
  Administered 2015-05-24: 0.25 mg via INTRAVENOUS
  Filled 2015-05-24: qty 1

## 2015-05-24 MED ORDER — AMIODARONE HCL IN DEXTROSE 360-4.14 MG/200ML-% IV SOLN
INTRAVENOUS | Status: AC
  Filled 2015-05-24: qty 400

## 2015-05-24 NOTE — Consult Note (Signed)
Reason for Consult: atrial fib with an RVR  Referring Physician: Dr. Jari Pigg is an 69 y.o. male.   HPI: The patient is a 69 yo man who underwent CABG several days ago. He has developed postop atrial fib with an RVR associated with hypotension. He has been treated with IV amiodarone and has had some hypotension. He does not feel particularly bad despite ventricular rates up to 150/min. The patient denies syncope. No anginal symptoms.    PMH: Past Medical History  Diagnosis Date  . Anxiety   . Hypertension   . Arthritis   . Sleep apnea     wears CPAP nightly  . Glaucoma     PSHX: Past Surgical History  Procedure Laterality Date  . Hernia repair      right  . Eye surgery      bil cataract  . Wisdom tooth extraction    . Fasciotomy foot / toe Left     foot  . Cardiac catheterization N/A 05/20/2015    Procedure: Left Heart Cath and Coronary Angiography;  Surgeon: Kathleene Hazel, MD;  Location: [facility] INVASIVE CV LAB;  Service: Cardiovascular;  Laterality: N/A;    FAMHX:History reviewed. No pertinent family history.  Social History:  reports that he has never smoked. He has never used smokeless tobacco. He reports that he does not drink alcohol or use illicit drugs.  Allergies:  Allergies  Allergen Reactions  . Penicillins Hives    Medications: I have reviewed the patient's current medications.  Dg Chest Port 1 View  05/24/2015   CLINICAL DATA:  Postop from CABG. Ischemic cardiomyopathy. Acute systolic congestive heart failure.  EXAM: PORTABLE CHEST - 1 VIEW  COMPARISON:  05/23/2015  FINDINGS: Swan-Ganz catheter has been removed. Left chest tube remains in place as well as right jugular Cordis. No pneumothorax visualized.  Low lung volumes and bibasilar atelectasis again demonstrated. Cardiomegaly and pulmonary vascular congestion are also stable. No new or worsening areas of pulmonary opacity identified.  IMPRESSION: No significant change in low lung  volumes and bibasilar atelectasis. Stable cardiomegaly and pulmonary vascular congestion.   Electronically Signed   By: Myles Rosenthal M.D.   On: 05/24/2015 07:56   Dg Chest Port 1 View  05/23/2015   CLINICAL DATA:  Patient status post CABG.  EXAM: PORTABLE CHEST - 1 VIEW  COMPARISON:  05/22/2015  FINDINGS: Left chest tube stable in position. Interval removal of enteric and endotracheal tube. Right IJ approach pulmonary arterial catheter stable in position. Stable marked cardiomegaly. Low lung volumes. Heterogeneous opacities left lung base. No pleural effusion or pneumothorax.  IMPRESSION: Interval removal of ET and endotracheal tubes. Left chest tube and PA catheter stable in position.  Marked cardiomegaly.  Heterogeneous opacities left lung base favored to represent atelectasis.   Electronically Signed   By: Annia Belt M.D.   On: 05/23/2015 10:07    ROS  As stated in the HPI and negative for all other systems.  Physical Exam  Vitals:Blood pressure 92/61, pulse 125, temperature 98.2 F (36.8 C), temperature source Oral, resp. rate 22, height  (1.753 m), weight 314 lb 2.5 oz (142.5 kg), SpO2 94 %.  obese appearing man, NAD HEENT: Unremarkable Neck:  7 cm JVD, no thyromegally Back:  No CVA tenderness Lungs:  Scattered rales but no wheezes HEART:  IRegular tachy rhythm, no murmurs, no rubs, no clicks Abd:  soft, positive bowel sounds, no organomegally, no rebound, no guarding Ext:  2  plus pulses, 1+ edema, no cyanosis, no clubbing Skin:  No rashes no nodules Neuro:  CN II through XII intact, motor grossly intact  Tele - atrial fib with an RVR  Assessment/Plan: 1. Atrial fib with an RVR 2. S/p CABG 3. Hypotension secondary to #1 and IV amio 4. Morbid obesity Rec: the patient has few symptoms at this time. We will attempt to control his rate/rhythm by adding digoxin, starting low dose cardizem as blood pressure tolerates, continue levophed for now though will hope to wean off as blood  pressure allows. Will plan to repeat digoxin in an hour or two if needed.   Sharlot GowdaGregg TaylorMD 05/24/2015, 3:43 PM

## 2015-05-24 NOTE — Progress Notes (Signed)
Patient ID: Jonathan Le, male   DOB: Mar 18, 1946, 69 y.o.   MRN: [id number] TCTS DAILY ICU PROGRESS NOTE                   [address]            [place] [address]          [phone]   2 Days Post-Op Procedure(s) (LRB): CORONARY ARTERY BYPASS GRAFTING (CABG), ON PUMP, TIMES THREE, USING LEFT INTERNAL MAMMARY ARTERY, RIGHT GREATER SAPHENOUS VEIN HARVESTED ENDOSCOPICALLY (N/A) TRANSESOPHAGEAL ECHOCARDIOGRAM (TEE) (N/A)  Total Length of Stay:  LOS: 5 days   Subjective: Up to chair, alert and talkative  Objective: Vital signs in last 24 hours: Temp:  [97.4 F (36.3 C)-99 F (37.2 C)] 97.4 F (36.3 C) (07/10 0732) Pulse Rate:  [75-102] 75 (07/10 0700) Cardiac Rhythm:  [-] Normal sinus rhythm (07/10 0732) Resp:  [8-33] 30 (07/10 0700) BP: (75-121)/(38-88) 107/51 mmHg (07/10 0700) SpO2:  [90 %-96 %] 92 % (07/10 0700) Weight:  [314 lb 2.5 oz (142.5 kg)] 314 lb 2.5 oz (142.5 kg) (07/10 0512)  Filed Weights   05/20/15 0115 05/23/15 0500 05/24/15 0512  Weight: 289 lb 0.4 oz (131.1 kg) 310 lb 9.6 oz (140.887 kg) 314 lb 2.5 oz (142.5 kg)    Weight change: 3 lb 8.9 oz (1.613 kg)   Hemodynamic parameters for last 24 hours: PAP: (24-30)/(9-16) 24/9 mmHg  Intake/Output from previous day: 07/09 0701 - 07/10 0700 In: 2031.7 [P.O.:690; I.V.:791.7; IV Piggyback:550] Out: 1135 [Urine:975; Chest Tube:160]  Intake/Output this shift: Total I/O In: -  Out: 15 [Urine:15]  Current Meds: Scheduled Meds: . acetaminophen  1,000 mg Oral 4 times per day   Or  . acetaminophen (TYLENOL) oral liquid 160 mg/5 mL  1,000 mg Per Tube 4 times per day  . ALPRAZolam  1 mg Oral BID  . antiseptic oral rinse  7 mL Mouth Rinse QID  . aspirin EC  325 mg Oral Daily   Or  . aspirin  324 mg Per Tube Daily  . atorvastatin  80 mg Oral q1800  . bisacodyl  10 mg Oral Daily   Or  . bisacodyl  10 mg Rectal Daily  . docusate sodium  200 mg Oral Daily  . dorzolamide  1 drop Left Eye TID    . finasteride  5 mg Oral Daily  . insulin aspart  0-24 Units Subcutaneous 6 times per day  . levalbuterol  1.25 mg Nebulization 4 times per day  . metoprolol tartrate  12.5 mg Oral BID   Or  . metoprolol tartrate  12.5 mg Per Tube BID  . pantoprazole  40 mg Oral QAC breakfast  . sodium chloride  3 mL Intravenous Q12H   Continuous Infusions: . sodium chloride 20 mL/hr at 05/22/15 1340  . sodium chloride    . sodium chloride 20 mL/hr at 05/22/15 1332  . dexmedetomidine Stopped (05/23/15 0200)  . DOPamine 3 mcg/kg/min (05/24/15 0700)  . lactated ringers 10 mL/hr at 05/22/15 1338  . lactated ringers 10 mL/hr at 05/22/15 1800  . milrinone 0.125 mcg/kg/min (05/24/15 0700)  . nitroGLYCERIN Stopped (05/22/15 1546)  . phenylephrine (NEO-SYNEPHRINE) Adult infusion     PRN Meds:.sodium chloride, metoprolol, midazolam, morphine injection, ondansetron (ZOFRAN) IV, oxyCODONE, sodium chloride, traMADol  General appearance: alert, cooperative, no distress and morbidly obese Neurologic: intact Heart: regular rate and rhythm, S1, S2 normal, no murmur, click, rub or gallop Lungs: diminished breath sounds bibasilar  Abdomen: soft, non-tender; bowel sounds normal; no masses,  no organomegaly Extremities: extremities normal, atraumatic, no cyanosis or edema and Homans sign is negative, no sign of DVT Wound: sternum stable  Lab Results: CBC: Recent Labs  05/23/15 1830 05/24/15 0451  WBC 12.6* 13.1*  HGB 12.8* 12.0*  HCT 37.3* 35.2*  PLT 162 119*   BMET:  Recent Labs  05/23/15 0400  05/23/15 1705 05/24/15 0451  NA 135  --  134* 131*  K 4.0  --  4.2 4.3  CL 102  --  99* 98*  CO2 26  --   --  27  GLUCOSE 145*  --  156* 164*  BUN 8  --  11 13  CREATININE 0.50*  < > 0.40* 0.54*  CALCIUM 8.1*  --   --  8.3*  < > = values in this interval not displayed.  PT/INR:  Recent Labs  05/22/15 1323  LABPROT 18.0*  INR 1.48   Radiology: Dg Chest Port 1 View  05/24/2015   CLINICAL DATA:   Postop from CABG. Ischemic cardiomyopathy. Acute systolic congestive heart failure.  EXAM: PORTABLE CHEST - 1 VIEW  COMPARISON:  05/23/2015  FINDINGS: Swan-Ganz catheter has been removed. Left chest tube remains in place as well as right jugular Cordis. No pneumothorax visualized.  Low lung volumes and bibasilar atelectasis again demonstrated. Cardiomegaly and pulmonary vascular congestion are also stable. No new or worsening areas of pulmonary opacity identified.  IMPRESSION: No significant change in low lung volumes and bibasilar atelectasis. Stable cardiomegaly and pulmonary vascular congestion.   Electronically Signed   By: Myles RosenthalJohn  Stahl M.D.   On: 05/24/2015 07:56     Assessment/Plan: S/P Procedure(s) (LRB): CORONARY ARTERY BYPASS GRAFTING (CABG), ON PUMP, TIMES THREE, USING LEFT INTERNAL MAMMARY ARTERY, RIGHT GREATER SAPHENOUS VEIN HARVESTED ENDOSCOPICALLY (N/A) TRANSESOPHAGEAL ECHOCARDIOGRAM (TEE) (N/A) Mobilize Diuresis- cr stable decreased uop will give lasix Diabetes control d/c tubes/lines Continue foley due to strict I&O, patient in ICU and urinary output monitoring Wean milrinone off, then dopamine  dvt prophylaxis with lovenox   Delight OvensEdward B Anijah Spohr 05/24/2015 8:05 AM

## 2015-05-24 NOTE — Progress Notes (Signed)
Patient ID: Jonathan HarpsHarold D Baig, male   DOB: 11/07/1946, 69 y.o.   MRN: [id number] EVENING ROUNDS NOTE :     [address]       [place] [id number]             [phone]                 2 Days Post-Op Procedure(s) (LRB): CORONARY ARTERY BYPASS GRAFTING (CABG), ON PUMP, TIMES THREE, USING LEFT INTERNAL MAMMARY ARTERY, RIGHT GREATER SAPHENOUS VEIN HARVESTED ENDOSCOPICALLY (N/A) TRANSESOPHAGEAL ECHOCARDIOGRAM (TEE) (N/A)  Total Length of Stay:  LOS: 5 days  BP 111/70 mmHg  Pulse 87  Temp(Src) 97.7 F (36.5 C) (Axillary)  Resp 29  Ht 5\' 9"  (1.753 m)  Wt 314 lb 2.5 oz (142.5 kg)  BMI 46.37 kg/m2  SpO2 97%  .Intake/Output      07/09 0701 - 07/10 0700 07/10 0701 - 07/11 0700   P.O. 690 270   I.V. (mL/kg) 791.7 (5.6) 897.4 (6.3)   Blood     NG/GT     IV Piggyback 550    Total Intake(mL/kg) 2031.7 (14.3) 1167.4 (8.2)   Urine (mL/kg/hr) 975 (0.3) 560 (0.3)   Emesis/NG output     Blood     Chest Tube 160 (0) 0 (0)   Total Output 1135 560   Net +896.7 +607.4          . sodium chloride 20 mL/hr at 05/22/15 1340  . sodium chloride    . sodium chloride 20 mL/hr at 05/22/15 1332  . amiodarone 30 mg/hr (05/24/15 1545)  . dexmedetomidine Stopped (05/23/15 0200)  . diltiazem (CARDIZEM) infusion 10 mg/hr (05/24/15 1800)  . DOPamine Stopped (05/24/15 1545)  . lactated ringers 10 mL/hr at 05/22/15 1338  . lactated ringers 10 mL/hr at 05/22/15 1800  . nitroGLYCERIN Stopped (05/22/15 1546)  . norepinephrine (LEVOPHED) Adult infusion 4 mcg/min (05/24/15 1800)  . phenylephrine (NEO-SYNEPHRINE) Adult infusion       Lab Results  Component Value Date   WBC 13.1* 05/24/2015   HGB 12.0* 05/24/2015   HCT 35.2* 05/24/2015   PLT 119* 05/24/2015   GLUCOSE 164* 05/24/2015   CHOL 121 05/20/2015   TRIG 61 05/20/2015   HDL 40* 05/20/2015   LDLCALC 69 05/20/2015   ALT 22 05/24/2015   AST 23 05/24/2015   NA 131* 05/24/2015   K 4.3 05/24/2015   CL 98* 05/24/2015   CREATININE 0.54* 05/24/2015   BUN 13 05/24/2015   CO2 27 05/24/2015   TSH 0.212* 05/24/2015   INR 1.48 05/22/2015   HGBA1C 6.0* 05/21/2015   rapid afib, has been difficult to control On levaphed due to low bp, dopamine off Treated with amiodarone  drip, digoxin and Cardizem rate now 120's uop slowed with d/c of dopamine    Delight OvensEdward B Marium Ragan MD  Beeper [phone] Office [phone] 05/24/2015 6:40 PM

## 2015-05-24 NOTE — Progress Notes (Signed)
MEDICATION RELATED CONSULT NOTE - INITIAL   Pharmacy Consult for Amiodarone Indication: Drug interactions  Allergies  Allergen Reactions  . Penicillins Hives    Patient Measurements: Height: 5\' 9"  (175.3 cm) Weight: (!) 314 lb 2.5 oz (142.5 kg) IBW/kg (Calculated) : 70.7 Adjusted Body Weight:   Vital Signs: Temp: 97.4 F (36.3 C) (07/10 0732) Temp Source: Oral (07/10 0732) BP: 95/65 mmHg (07/10 1000) Pulse Rate: 77 (07/10 1000) Intake/Output from previous day: 07/09 0701 - 07/10 0700 In: 2031.7 [P.O.:690; I.V.:791.7; IV Piggyback:550] Out: 1135 [Urine:975; Chest Tube:160] Intake/Output from this shift: Total I/O In: 200.3 [I.V.:200.3] Out: 235 [Urine:235]  Labs:  Recent Labs  05/22/15 1323  05/22/15 1900  05/23/15 0400 05/23/15 1700 05/23/15 1705 05/23/15 1830 05/24/15 0451  WBC 17.6*  --  12.8*  --  11.6*  --   --  12.6* 13.1*  HGB 13.7  < > 13.3  < > 12.0*  --  12.2* 12.8* 12.0*  HCT 39.9  < > 38.2*  < > 35.2*  --  36.0* 37.3* 35.2*  PLT 179  --  183  --  167  --   --  162 119*  APTT 31  --   --   --   --   --   --   --   --   CREATININE  --   --  0.52*  < > 0.50* 0.60* 0.40*  --  0.54*  MG  --   --  2.5*  --  1.9 2.0  --   --   --   < > = values in this interval not displayed. Estimated Creatinine Clearance: 122.5 mL/min (by C-G formula based on Cr of 0.54).   Microbiology:   Medical History: Past Medical History  Diagnosis Date  . Anxiety   . Hypertension   . Arthritis   . Sleep apnea     wears CPAP nightly  . Glaucoma      Amiodarone Drug - Drug Interaction Consult Note  Recommendations: 1. Closely monitor K+ even as an outpatient on diuretics. 2. Counsel patients to report any muscle pain or weakness immediately on high doses Lipitor.    Amiodarone is metabolized by the cytochrome P450 system and therefore has the potential to cause many drug interactions. Amiodarone has an average plasma half-life of 50 days (range 20 to 100 days).    There is potential for drug interactions to occur several weeks or months after stopping treatment and the onset of drug interactions may be slow after initiating amiodarone.   [x]  Statins: Increased risk of myopathy. Simvastatin- restrict dose to 20mg  daily. Other statins: counsel patients to report any muscle pain or weakness immediately.  []  Anticoagulants: Amiodarone can increase anticoagulant effect. Consider warfarin dose reduction. Patients should be monitored closely and the dose of anticoagulant altered accordingly, remembering that amiodarone levels take several weeks to stabilize.  []  Antiepileptics: Amiodarone can increase plasma concentration of phenytoin, the dose should be reduced. Note that small changes in phenytoin dose can result in large changes in levels. Monitor patient and counsel on signs of toxicity.  [x]  Beta blockers: increased risk of bradycardia, AV block and myocardial depression. Sotalol - avoid concomitant use.  []   Calcium channel blockers (diltiazem and verapamil): increased risk of bradycardia, AV block and myocardial depression.  []   Cyclosporine: Amiodarone increases levels of cyclosporine. Reduced dose of cyclosporine is recommended.  []  Digoxin dose should be halved when amiodarone is started.  [x]  Diuretics: increased risk of  cardiotoxicity if hypokalemia occurs.(Chlorthalidone, Lasix PTA)   Oral hypoglycemic agents (glyburide, glipizide, glimepiride): increased risk of hypoglycemia. Patient's glucose levels should be monitored closely when initiating amiodarone therapy.    Drugs that prolong the QT interval:  Torsades de pointes risk may be increased with concurrent use - avoid if possible.  Monitor QTc, also keep magnesium/potassium WNL if concurrent therapy can't be avoided. Marland Kitchen Antibiotics: e.g. fluoroquinolones, erythromycin. . Antiarrhythmics: e.g. quinidine, procainamide, disopyramide, sotalol. . Antipsychotics: e.g. phenothiazines,  haloperidol.  . Lithium, tricyclic antidepressants, and methadone. Thank You,    Nataki Mccrumb S. Merilynn Finland, PharmD, BCPS Clinical Staff Pharmacist Pager [phone]  Misty Stanley Stillinger  05/24/2015 10:10 AM        Misty Stanley Stillinger 05/24/2015,10:10 AM

## 2015-05-25 ENCOUNTER — Inpatient Hospital Stay (HOSPITAL_COMMUNITY): Payer: Medicare Other

## 2015-05-25 ENCOUNTER — Encounter (HOSPITAL_COMMUNITY): Payer: Self-pay | Admitting: Cardiothoracic Surgery

## 2015-05-25 DIAGNOSIS — I481 Persistent atrial fibrillation: Secondary | ICD-10-CM

## 2015-05-25 LAB — GLUCOSE, CAPILLARY
GLUCOSE-CAPILLARY: 113 mg/dL — AB (ref 65–99)
Glucose-Capillary: 101 mg/dL — ABNORMAL HIGH (ref 65–99)
Glucose-Capillary: 111 mg/dL — ABNORMAL HIGH (ref 65–99)
Glucose-Capillary: 125 mg/dL — ABNORMAL HIGH (ref 65–99)
Glucose-Capillary: 125 mg/dL — ABNORMAL HIGH (ref 65–99)
Glucose-Capillary: 138 mg/dL — ABNORMAL HIGH (ref 65–99)
Glucose-Capillary: 235 mg/dL — ABNORMAL HIGH (ref 65–99)

## 2015-05-25 LAB — TYPE AND SCREEN
ABO/RH(D): O POS
Antibody Screen: NEGATIVE
Unit division: 0
Unit division: 0

## 2015-05-25 LAB — POCT I-STAT 4, (NA,K, GLUC, HGB,HCT)
Glucose, Bld: 119 mg/dL — ABNORMAL HIGH (ref 65–99)
HCT: 37 % — ABNORMAL LOW (ref 39.0–52.0)
Hemoglobin: 12.6 g/dL — ABNORMAL LOW (ref 13.0–17.0)
Potassium: 3.7 mmol/L (ref 3.5–5.1)
SODIUM: 131 mmol/L — AB (ref 135–145)

## 2015-05-25 LAB — CBC
HCT: 36.1 % — ABNORMAL LOW (ref 39.0–52.0)
Hemoglobin: 12.3 g/dL — ABNORMAL LOW (ref 13.0–17.0)
MCH: 29.4 pg (ref 26.0–34.0)
MCHC: 34.1 g/dL (ref 30.0–36.0)
MCV: 86.4 fL (ref 78.0–100.0)
Platelets: 270 10*3/uL (ref 150–400)
RBC: 4.18 MIL/uL — ABNORMAL LOW (ref 4.22–5.81)
RDW: 13.5 % (ref 11.5–15.5)
WBC: 16.7 10*3/uL — ABNORMAL HIGH (ref 4.0–10.5)

## 2015-05-25 LAB — BASIC METABOLIC PANEL
Anion gap: 9 (ref 5–15)
BUN: 16 mg/dL (ref 6–20)
CO2: 26 mmol/L (ref 22–32)
Calcium: 8.5 mg/dL — ABNORMAL LOW (ref 8.9–10.3)
Chloride: 94 mmol/L — ABNORMAL LOW (ref 101–111)
Creatinine, Ser: 0.5 mg/dL — ABNORMAL LOW (ref 0.61–1.24)
GFR calc Af Amer: >60 mL/min (ref >60)
GFR calc non Af Amer: >60 mL/min (ref >60)
Glucose, Bld: 261 mg/dL — ABNORMAL HIGH (ref 65–99)
Potassium: 4 mmol/L (ref 3.5–5.1)
Sodium: 129 mmol/L — ABNORMAL LOW (ref 135–145)

## 2015-05-25 LAB — MAGNESIUM: Magnesium: 2 mg/dL (ref 1.7–2.4)

## 2015-05-25 LAB — CARBOXYHEMOGLOBIN
Carboxyhemoglobin: 0.8 % (ref 0.5–1.5)
Methemoglobin: 1 % (ref 0.0–1.5)
O2 Saturation: 69.7 %
Total hemoglobin: 12.4 g/dL — ABNORMAL LOW (ref 13.5–18.0)

## 2015-05-25 MED ORDER — NOREPINEPHRINE BITARTRATE 1 MG/ML IV SOLN
2.0000 ug/min | INTRAVENOUS | Status: AC
  Administered 2015-05-25: 9 ug/min via INTRAVENOUS
  Filled 2015-05-25: qty 16

## 2015-05-25 MED ORDER — FUROSEMIDE 10 MG/ML IJ SOLN
40.0000 mg | Freq: Every day | INTRAMUSCULAR | Status: DC
  Administered 2015-05-25: 40 mg via INTRAVENOUS
  Filled 2015-05-25: qty 4

## 2015-05-25 MED ORDER — POTASSIUM CHLORIDE 10 MEQ/50ML IV SOLN
10.0000 meq | INTRAVENOUS | Status: AC
  Administered 2015-05-25 (×3): 10 meq via INTRAVENOUS
  Filled 2015-05-25 (×4): qty 50

## 2015-05-25 MED ORDER — SODIUM CHLORIDE 0.9 % IJ SOLN: 10.0000 mL | INTRAMUSCULAR | Status: DC | PRN

## 2015-05-25 NOTE — Care Management (Signed)
Important Message  Patient Details  Name: Jonathan Le MRN: [id number] Date of Birth: 01/16/1946   Medicare Important Message Given:  Yes-third notification given    Kyla BalzarineShealy, Cornella Emmer Abena 05/25/2015, 4:08 PM

## 2015-05-25 NOTE — Progress Notes (Signed)
3 Days Post-Op Procedure(s) (LRB): CORONARY ARTERY BYPASS GRAFTING (CABG), ON PUMP, TIMES THREE, USING LEFT INTERNAL MAMMARY ARTERY, RIGHT GREATER SAPHENOUS VEIN HARVESTED ENDOSCOPICALLY (N/A) TRANSESOPHAGEAL ECHOCARDIOGRAM (TEE) (N/A) Subjective: Slow progress postop day 3 after multivessel CABG Atrial fibrillation now converted to sinus rhythm Patient able to walk only a few feet due to his morbid obesity Continuing diuresis and IV amiodarone-wean leave that his blood pressure tolerates PICC line to be placed  Objective: Vital signs in last 24 hours: Temp:  [95.3 F (35.2 C)-99 F (37.2 C)] 97 F (36.1 C) (07/11 1647) Pulse Rate:  [27-91] 91 (07/11 1400) Cardiac Rhythm:  [-] Normal sinus rhythm (07/11 0800) Resp:  [20-29] 23 (07/11 1100) BP: (83-161)/(43-89) 132/86 mmHg (07/11 1400) SpO2:  [93 %-100 %] 98 % (07/11 1400) Weight:  [314 lb 9.5 oz (142.7 kg)] 314 lb 9.5 oz (142.7 kg) (07/11 0630)  Hemodynamic parameters for last 24 hours:   stable one in sinus rhythm  Intake/Output from previous day: 07/10 0701 - 07/11 0700 In: 1966.8 [P.O.:270; I.V.:1696.8] Out: 1440 [Urine:1440] Intake/Output this shift: Total I/O In: 202.7 [I.V.:202.7] Out: 1255 [Urine:1255]  Neuro intact Diminished breath sounds Mild-moderate pedal edema Abdomen soft  Lab Results:  Recent Labs  05/24/15 1835 05/25/15 0403 05/25/15 1500  WBC 13.0* 16.7*  --   HGB 12.0* 12.3* 12.6*  HCT 35.6* 36.1* 37.0*  PLT 194 270  --    BMET:  Recent Labs  05/24/15 1835 05/25/15 0403 05/25/15 1500  NA 130* 129* 131*  K 4.2 4.0 3.7  CL 97* 94*  --   CO2 26 26  --   GLUCOSE 189* 261* 119*  BUN 17 16  --   CREATININE 0.61 0.50*  --   CALCIUM 8.5* 8.5*  --     PT/INR: No results for input(s): LABPROT, INR in the last 72 hours. ABG    Component Value Date/Time   PHART 7.433 05/23/2015 0334   HCO3 23.7 05/23/2015 0334   TCO2 23 05/23/2015 1705   ACIDBASEDEF 1.0 05/22/2015 2159   O2SAT 69.7  05/25/2015 0815   CBG (last 3)   Recent Labs  05/25/15 0751 05/25/15 1226 05/25/15 1643  GLUCAP 111* 125* 113*    Assessment/Plan: S/P Procedure(s) (LRB): CORONARY ARTERY BYPASS GRAFTING (CABG), ON PUMP, TIMES THREE, USING LEFT INTERNAL MAMMARY ARTERY, RIGHT GREATER SAPHENOUS VEIN HARVESTED ENDOSCOPICALLY (N/A) TRANSESOPHAGEAL ECHOCARDIOGRAM (TEE) (N/A) Mobilize Diuresis Diabetes control d/c tubes/lines   LOS: 6 days    Kathlee Nationseter Van Trigt III 05/25/2015

## 2015-05-25 NOTE — Progress Notes (Signed)
Nutrition Brief Note  Patient identified on the Malnutrition Screening Tool (MST) Report.  Wt Readings from Last 15 Encounters:  05/25/15 314 lb 9.5 oz (142.7 kg)  11/22/13 315 lb (142.883 kg)    Body mass index is 46.44 kg/(m^2). Patient meets criteria for Obesity Class III based on current BMI.   Current diet order is Heart Healthy.  Labs and medications reviewed.   No nutrition interventions warranted at this time. If nutrition issues arise, please consult RD.   Maureen ChattersKatie Aretta Stetzel, RD, LDN Pager #: [phone] After-Hours Pager #: [phone]

## 2015-05-25 NOTE — Progress Notes (Signed)
PROGRESS NOTE  Subjective:   69 yo with CAD , CABG, Post op atrial fib  HR is better with amio and digoxin.   Objective:    Vital Signs:   Temp:  [95.3 F (35.2 C)-99 F (37.2 C)] 97.3 F (36.3 C) (07/11 1229) Pulse Rate:  [27-158] 60 (07/11 0800) Resp:  [20-31] 20 (07/11 0800) BP: (73-161)/(43-83) 104/65 mmHg (07/11 0800) SpO2:  [90 %-100 %] 97 % (07/11 0800) FiO2 (%):  [55 %] 55 % (07/10 1600) Weight:  [142.7 kg (314 lb 9.5 oz)] 142.7 kg (314 lb 9.5 oz) (07/11 0630)  Last BM Date: 05/20/15   24-hour weight change: Weight change: 0.2 kg (7.1 oz)  Weight trends: Filed Weights   05/23/15 0500 05/24/15 0512 05/25/15 0630  Weight: 140.887 kg (310 lb 9.6 oz) 142.5 kg (314 lb 2.5 oz) 142.7 kg (314 lb 9.5 oz)    Intake/Output:  07/10 0701 - 07/11 0700 In: 1966.8 [P.O.:270; I.V.:1696.8] Out: 1440 [Urine:1440] Total I/O In: 60.5 [I.V.:60.5] Out: 45 [Urine:45]   Physical Exam: BP 104/65 mmHg  Pulse 60  Temp(Src) 97.3 F (36.3 C) (Oral)  Resp 20  Ht 5\' 9"  (1.753 m)  Wt 142.7 kg (314 lb 9.5 oz)  BMI 46.44 kg/m2  SpO2 97%  Wt Readings from Last 3 Encounters:  05/25/15 142.7 kg (314 lb 9.5 oz)  11/22/13 142.883 kg (315 lb)    General: Vital signs reviewed and noted.   Head: Normocephalic, atraumatic.  Eyes: conjunctivae/corneas clear.  EOM's intact.   Throat: normal  Neck:  normal   Lungs:    clear   Heart:  RR   Abdomen:  Soft, non-tender, obese   Extremities:  2+ pitting edema    Neurologic: A&O X3, CN II - XII are grossly intact.   Psych: Normal     Labs: BMET:  Recent Labs  05/24/15 1040 05/24/15 1835 05/25/15 0403  NA  --  130* 129*  K  --  4.2 4.0  CL  --  97* 94*  CO2  --  26 26  GLUCOSE  --  189* 261*  BUN  --  17 16  CREATININE  --  0.61 0.50*  CALCIUM  --  8.5* 8.5*  MG 2.0  --  2.0    Liver function tests:  Recent Labs  05/24/15 1040  AST 23  ALT 22  ALKPHOS 67  BILITOT 0.7  PROT 5.1*  ALBUMIN 2.4*   No  results for input(s): LIPASE, AMYLASE in the last 72 hours.  CBC:  Recent Labs  05/24/15 1835 05/25/15 0403  WBC 13.0* 16.7*  HGB 12.0* 12.3*  HCT 35.6* 36.1*  MCV 86.6 86.4  PLT 194 270    Cardiac Enzymes: No results for input(s): CKTOTAL, CKMB, TROPONINI in the last 72 hours.  Coagulation Studies: No results for input(s): LABPROT, INR in the last 72 hours.  Other: Invalid input(s): POCBNP No results for input(s): DDIMER in the last 72 hours. No results for input(s): HGBA1C in the last 72 hours. No results for input(s): CHOL, HDL, LDLCALC, TRIG, CHOLHDL in the last 72 hours.  Recent Labs  05/24/15 1040  TSH 0.212*   No results for input(s): VITAMINB12, FOLATE, FERRITIN, TIBC, IRON, RETICCTPCT in the last 72 hours.   Other results:  EKG  ( personally reviewed )  -V pacing .   Medications:    Infusions: . sodium chloride 20 mL/hr at 05/22/15 1340  . sodium chloride    .  sodium chloride 20 mL/hr at 05/22/15 1332  . amiodarone 30 mg/hr (05/25/15 1230)  . dexmedetomidine Stopped (05/23/15 0200)  . diltiazem (CARDIZEM) infusion Stopped (05/25/15 0100)  . DOPamine Stopped (05/24/15 1545)  . lactated ringers 10 mL/hr at 05/22/15 1338  . lactated ringers 10 mL/hr at 05/25/15 0400  . norepinephrine (LEVOPHED) Adult infusion 9 mcg/min (05/25/15 0836)  . phenylephrine (NEO-SYNEPHRINE) Adult infusion      Scheduled Medications: . acetaminophen  1,000 mg Oral 4 times per day   Or  . acetaminophen (TYLENOL) oral liquid 160 mg/5 mL  1,000 mg Per Tube 4 times per day  . ALPRAZolam  1 mg Oral BID  . amiodarone  200 mg Oral Q12H   Followed by  . [START ON 06/01/2015] amiodarone  200 mg Oral Daily  . antiseptic oral rinse  7 mL Mouth Rinse QID  . aspirin EC  325 mg Oral Daily   Or  . aspirin  324 mg Per Tube Daily  . atorvastatin  80 mg Oral q1800  . bisacodyl  10 mg Oral Daily   Or  . bisacodyl  10 mg Rectal Daily  . docusate sodium  200 mg Oral Daily  .  dorzolamide  1 drop Left Eye TID  . enoxaparin (LOVENOX) injection  40 mg Subcutaneous Q24H  . finasteride  5 mg Oral Daily  . furosemide  40 mg Intravenous Daily  . insulin aspart  0-24 Units Subcutaneous 6 times per day  . levalbuterol  1.25 mg Nebulization 4 times per day  . metoprolol tartrate  12.5 mg Oral BID   Or  . metoprolol tartrate  12.5 mg Per Tube BID  . pantoprazole  40 mg Oral QAC breakfast  . sodium chloride  3 mL Intravenous Q12H    Assessment/ Plan:   Principal Problem:   NSTEMI (non-ST elevated myocardial infarction) Active Problems:   Morbid obesity   OSA on CPAP   Other specified hypotension   Cardiomyopathy, ischemic   CAD, multiple vessel   Acute systolic congestive heart failure, NYHA class 3   S/P CABG x 3   1. CAD :  S/p CABG  2. Atrial fib:  Rate is better .  Continue meds. Amio. Digoxin .   3. Hypotension:  BP is better.     Disposition:  Length of Stay: 6  Vesta Mixer, Montez Hageman., MD, [facility] 05/25/2015, 1:47 PM Office [phone] Pager [phone]

## 2015-05-25 NOTE — Progress Notes (Signed)
  [facility] BRIEF SICU PROGRESS NOTE  3 Days Post-Op  S/P Procedure(s) (LRB): CORONARY ARTERY BYPASS GRAFTING (CABG), ON PUMP, TIMES THREE, USING LEFT INTERNAL MAMMARY ARTERY, RIGHT GREATER SAPHENOUS VEIN HARVESTED ENDOSCOPICALLY (N/A) TRANSESOPHAGEAL ECHOCARDIOGRAM (TEE) (N/A)   Feels well. Stable day.  Developed some hematuria via Foley this afternoon.  Denies dysuria DDD paced w/ stable BP off levophed UOP adequate, grossly heme +  Plan: Continue current plan.  Consider Foley removal  Purcell NailsClarence H Owen 05/25/2015 7:36 PM

## 2015-05-25 NOTE — Progress Notes (Signed)
Peripherally Inserted Central Catheter/Midline Placement  The IV Nurse has discussed with the patient and/or persons authorized to consent for the patient, the purpose of this procedure and the potential benefits and risks involved with this procedure.  The benefits include less needle sticks, lab draws from the catheter and patient may be discharged home with the catheter.  Risks include, but not limited to, infection, bleeding, blood clot (thrombus formation), and puncture of an artery; nerve damage and irregular heat beat.  Alternatives to this procedure were also discussed.  PICC/Midline Placement Documentation  PICC / Midline Double Lumen 05/25/15 PICC Right Basilic 47 cm 0 cm (Active)  Indication for Insertion or Continuance of Line Prolonged intravenous therapies 05/25/2015  1:00 PM  Exposed Catheter (cm) 0 cm 05/25/2015  1:00 PM  Dressing Change Due 06/01/15 05/25/2015  1:00 PM       Jonathan Le, Jonathan Le 05/25/2015, 1:36 PM

## 2015-05-26 ENCOUNTER — Inpatient Hospital Stay (HOSPITAL_COMMUNITY): Payer: Medicare Other

## 2015-05-26 LAB — CBC
HCT: 34.9 % — ABNORMAL LOW (ref 39.0–52.0)
Hemoglobin: 12 g/dL — ABNORMAL LOW (ref 13.0–17.0)
MCH: 29.3 pg (ref 26.0–34.0)
MCHC: 34.4 g/dL (ref 30.0–36.0)
MCV: 85.1 fL (ref 78.0–100.0)
Platelets: 220 10*3/uL (ref 150–400)
RBC: 4.1 MIL/uL — ABNORMAL LOW (ref 4.22–5.81)
RDW: 13.4 % (ref 11.5–15.5)
WBC: 9 10*3/uL (ref 4.0–10.5)

## 2015-05-26 LAB — CARBOXYHEMOGLOBIN
Carboxyhemoglobin: 1.4 % (ref 0.5–1.5)
Methemoglobin: 1.1 % (ref 0.0–1.5)
O2 Saturation: 55.5 %
Total hemoglobin: 12.3 g/dL — ABNORMAL LOW (ref 13.5–18.0)

## 2015-05-26 LAB — GLUCOSE, CAPILLARY
GLUCOSE-CAPILLARY: 110 mg/dL — AB (ref 65–99)
GLUCOSE-CAPILLARY: 120 mg/dL — AB (ref 65–99)
GLUCOSE-CAPILLARY: 115 mg/dL — AB (ref 65–99)
Glucose-Capillary: 82 mg/dL (ref 65–99)
Glucose-Capillary: 118 mg/dL — ABNORMAL HIGH (ref 65–99)

## 2015-05-26 LAB — COMPREHENSIVE METABOLIC PANEL
ALT: 24 U/L (ref 17–63)
AST: 29 U/L (ref 15–41)
Albumin: 2.1 g/dL — ABNORMAL LOW (ref 3.5–5.0)
Alkaline Phosphatase: 74 U/L (ref 38–126)
Anion gap: 6 (ref 5–15)
BUN: 12 mg/dL (ref 6–20)
CO2: 29 mmol/L (ref 22–32)
Calcium: 8.7 mg/dL — ABNORMAL LOW (ref 8.9–10.3)
Chloride: 97 mmol/L — ABNORMAL LOW (ref 101–111)
Creatinine, Ser: 0.56 mg/dL — ABNORMAL LOW (ref 0.61–1.24)
GFR calc Af Amer: >60 mL/min (ref >60)
GFR calc non Af Amer: >60 mL/min (ref >60)
Glucose, Bld: 98 mg/dL (ref 65–99)
Potassium: 4.1 mmol/L (ref 3.5–5.1)
Sodium: 132 mmol/L — ABNORMAL LOW (ref 135–145)
Total Bilirubin: 0.8 mg/dL (ref 0.3–1.2)
Total Protein: 4.8 g/dL — ABNORMAL LOW (ref 6.5–8.1)

## 2015-05-26 MED ORDER — MAGNESIUM HYDROXIDE 400 MG/5ML PO SUSP
30.0000 mL | Freq: Every day | ORAL | Status: DC | PRN
  Filled 2015-05-26: qty 30

## 2015-05-26 MED ORDER — BISOPROLOL FUMARATE 5 MG PO TABS
2.5000 mg | ORAL_TABLET | Freq: Every day | ORAL | Status: DC
  Administered 2015-05-27 – 2015-05-29 (×3): 2.5 mg via ORAL
  Filled 2015-05-26 (×4): qty 0.5

## 2015-05-26 MED ORDER — SODIUM CHLORIDE 0.9 % IJ SOLN
3.0000 mL | Freq: Two times a day (BID) | INTRAMUSCULAR | Status: DC
  Administered 2015-05-26: 3 mL via INTRAVENOUS

## 2015-05-26 MED ORDER — MOVING RIGHT ALONG BOOK
Freq: Once | Status: AC
  Administered 2015-05-26: 09:00:00
  Filled 2015-05-26: qty 1

## 2015-05-26 MED ORDER — FUROSEMIDE 10 MG/ML IJ SOLN
40.0000 mg | Freq: Two times a day (BID) | INTRAMUSCULAR | Status: DC
  Administered 2015-05-26 (×2): 40 mg via INTRAVENOUS
  Filled 2015-05-26 (×4): qty 4

## 2015-05-26 MED ORDER — SORBITOL 70 % PO SOLN
60.0000 mL | Freq: Every morning | ORAL | Status: DC
  Filled 2015-05-26 (×3): qty 60

## 2015-05-26 MED ORDER — FUROSEMIDE 10 MG/ML IJ SOLN: 40.0000 mg | Freq: Two times a day (BID) | INTRAMUSCULAR | Status: DC

## 2015-05-26 MED ORDER — AMIODARONE HCL 200 MG PO TABS
400.0000 mg | ORAL_TABLET | Freq: Two times a day (BID) | ORAL | Status: DC
  Administered 2015-05-26: 400 mg via ORAL
  Filled 2015-05-26 (×2): qty 2

## 2015-05-26 MED ORDER — POTASSIUM CHLORIDE CRYS ER 10 MEQ PO TBCR
10.0000 meq | EXTENDED_RELEASE_TABLET | Freq: Every day | ORAL | Status: DC
  Administered 2015-05-26 – 2015-05-28 (×3): 10 meq via ORAL
  Filled 2015-05-26 (×4): qty 1

## 2015-05-26 MED ORDER — SODIUM CHLORIDE 0.9 % IV SOLN
250.0000 mL | INTRAVENOUS | Status: DC | PRN
Start: 2015-05-26 — End: 2015-05-27

## 2015-05-26 MED ORDER — SODIUM CHLORIDE 0.9 % IJ SOLN: 3.0000 mL | INTRAMUSCULAR | Status: DC | PRN

## 2015-05-26 MED ORDER — AMIODARONE HCL 200 MG PO TABS
200.0000 mg | ORAL_TABLET | Freq: Two times a day (BID) | ORAL | Status: DC
  Administered 2015-05-27 (×2): 200 mg via ORAL
  Filled 2015-05-26 (×5): qty 1

## 2015-05-26 MED ORDER — SORBITOL 70 % SOLN
60.0000 mL | Freq: Every day | Status: AC
  Administered 2015-05-26: 60 mL via ORAL
  Filled 2015-05-26 (×3): qty 60

## 2015-05-26 MED ORDER — INSULIN ASPART 100 UNIT/ML ~~LOC~~ SOLN
0.0000 [IU] | Freq: Three times a day (TID) | SUBCUTANEOUS | Status: DC
  Administered 2015-05-27: 2 [IU] via SUBCUTANEOUS

## 2015-05-26 NOTE — Progress Notes (Signed)
SICU p.m. Rounds  Patient maintaining sinus rhythm, rate is slow so amiodarone dose reduced Will stop Lopressor and use Zebeta the patient was on preop  Continue with Diuresis, mobilization

## 2015-05-26 NOTE — Evaluation (Addendum)
Physical Therapy Evaluation Patient Details Name: DEMARRIUS GUERRERO MRN: [id number] DOB: 18-Mar-1946 Today's Date: 05/26/2015   History of Present Illness  69 year old male has a history of hypertension, sleep apnea and morbid obesity. Pt with STEMI s/p CABG x 3  Clinical Impression  Pt benefits from encouragement and reassurance to maximize mobility. Pt educated for sternal precaution, goal, plan and need to ambulate multiple times a day. Pt will benefit from acute therapy to maximize mobility, function and gait to decrease burden of care and maximize adherence to precautions with mobility.     Follow Up Recommendations Home health PT;Supervision/Assistance - 24 hour    Equipment Recommendations  Rolling walker with 5" wheels;3in1 (PT) (bariatric)    Recommendations for Other Services OT consult     Precautions / Restrictions Precautions Precautions: Sternal;Fall Restrictions Weight Bearing Restrictions: No      Mobility  Bed Mobility               General bed mobility comments: pt on bSC on arrival  Transfers Overall transfer level: Needs assistance   Transfers: Sit to/from Stand Sit to Stand: Min assist         General transfer comment: cues for hand placement, anterior weight shift and momentum to rise from [facility] and recliner  Ambulation/Gait Ambulation/Gait assistance: Min assist Ambulation Distance (Feet): 40 Feet Assistive device: Rolling walker (2 wheeled) Gait Pattern/deviations: Step-through pattern;Decreased stride length;Trunk flexed;Wide base of support   Gait velocity interpretation: Below normal speed for age/gender General Gait Details: 40' x 2 trials with seated rest between, chair pulled behind for safety and fatigue. Pt states he walked 150' this am with nursing which exhausted him  Stairs            Wheelchair Mobility    Modified Rankin (Stroke Patients Only)       Balance Overall balance assessment: Needs assistance   Sitting  balance-Leahy Scale: Fair       Standing balance-Leahy Scale: Poor                               Pertinent Vitals/Pain Pain Assessment: 0-10 Pain Score: 5  Pain Location: bil knee and back pain Pain Descriptors / Indicators: Aching Pain Intervention(s): Repositioned  HR 78-86 with activity sats 93% on 2L with gait    Home Living Family/patient expects to be discharged to:: Private residence Living Arrangements: Spouse/significant other Available Help at Discharge: Family;Available 24 hours/day Type of Home: House Home Access: Stairs to enter   [employer] of Steps: 2 Home Layout: Two level;Bed/bath upstairs;Able to live on main level with bedroom/bathroom Home Equipment: None      Prior Function Level of Independence: Independent         Comments: pt with normally limited gait due to back and knee pain     Hand Dominance        Extremity/Trunk Assessment   Upper Extremity Assessment: Generalized weakness           Lower Extremity Assessment: Generalized weakness      Cervical / Trunk Assessment: Kyphotic  Communication   Communication: No difficulties  Cognition Arousal/Alertness: Awake/alert Behavior During Therapy: WFL for tasks assessed/performed Overall Cognitive Status: Within Functional Limits for tasks assessed                      General Comments      Exercises  Assessment/Plan    PT Assessment Patient needs continued PT services  PT Diagnosis Difficulty walking;Generalized weakness;Acute pain   PT Problem List Decreased strength;Decreased activity tolerance;Decreased balance;Pain;Decreased mobility;Cardiopulmonary status limiting activity;Decreased knowledge of use of DME;Decreased knowledge of precautions  PT Treatment Interventions Gait training;Stair training;DME instruction;Functional mobility training;Therapeutic activities;Therapeutic exercise;Balance training;Patient/family  education;Cognitive remediation   PT Goals (Current goals can be found in the Care Plan section) Acute Rehab PT Goals 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 PT Goal Formulation: With patient Time For Goal Achievement: 06/09/15 Potential to Achieve Goals: Good    Frequency Min 3X/week   Barriers to discharge        Co-evaluation               End of Session Equipment Utilized During Treatment: Gait belt;Oxygen Activity Tolerance: Patient tolerated treatment well Patient left: in chair;with call bell/phone within reach Nurse Communication: Mobility status         Time: 0912-0946 PT Time Calculation (min) (ACUTE ONLY): 34 min   Charges:   PT Evaluation $Initial PT Evaluation Tier I: 1 Procedure PT Treatments $Gait Training: 8-22 mins   PT G Codes:        Delorse Lekabor, Jazlen Ogarro Beth 05/26/2015, 10:39 AM Delaney MeigsMaija Tabor Tanza Pellot, PT [phone]

## 2015-05-27 ENCOUNTER — Inpatient Hospital Stay (HOSPITAL_COMMUNITY): Payer: Medicare Other

## 2015-05-27 LAB — COMPREHENSIVE METABOLIC PANEL
ALT: 30 U/L (ref 17–63)
AST: 35 U/L (ref 15–41)
Albumin: 2 g/dL — ABNORMAL LOW (ref 3.5–5.0)
Alkaline Phosphatase: 85 U/L (ref 38–126)
Anion gap: 9 (ref 5–15)
BUN: 13 mg/dL (ref 6–20)
CO2: 28 mmol/L (ref 22–32)
Calcium: 8.5 mg/dL — ABNORMAL LOW (ref 8.9–10.3)
Chloride: 96 mmol/L — ABNORMAL LOW (ref 101–111)
Creatinine, Ser: 0.6 mg/dL — ABNORMAL LOW (ref 0.61–1.24)
GFR calc Af Amer: >60 mL/min (ref >60)
GFR calc non Af Amer: >60 mL/min (ref >60)
Glucose, Bld: 108 mg/dL — ABNORMAL HIGH (ref 65–99)
Potassium: 3.4 mmol/L — ABNORMAL LOW (ref 3.5–5.1)
Sodium: 133 mmol/L — ABNORMAL LOW (ref 135–145)
Total Bilirubin: 0.6 mg/dL (ref 0.3–1.2)
Total Protein: 4.8 g/dL — ABNORMAL LOW (ref 6.5–8.1)

## 2015-05-27 LAB — GLUCOSE, CAPILLARY
GLUCOSE-CAPILLARY: 121 mg/dL — AB (ref 65–99)
GLUCOSE-CAPILLARY: 117 mg/dL — AB (ref 65–99)
Glucose-Capillary: 95 mg/dL (ref 65–99)
Glucose-Capillary: 116 mg/dL — ABNORMAL HIGH (ref 65–99)

## 2015-05-27 LAB — CBC
HCT: 34.6 % — ABNORMAL LOW (ref 39.0–52.0)
Hemoglobin: 11.7 g/dL — ABNORMAL LOW (ref 13.0–17.0)
MCH: 28.6 pg (ref 26.0–34.0)
MCHC: 33.8 g/dL (ref 30.0–36.0)
MCV: 84.6 fL (ref 78.0–100.0)
Platelets: 236 10*3/uL (ref 150–400)
RBC: 4.09 MIL/uL — ABNORMAL LOW (ref 4.22–5.81)
RDW: 13.4 % (ref 11.5–15.5)
WBC: 7.2 10*3/uL (ref 4.0–10.5)

## 2015-05-27 MED ORDER — POTASSIUM CHLORIDE 10 MEQ/50ML IV SOLN
10.0000 meq | INTRAVENOUS | Status: AC
  Administered 2015-05-27 (×3): 10 meq via INTRAVENOUS
  Filled 2015-05-27 (×3): qty 50

## 2015-05-27 MED ORDER — LEVALBUTEROL HCL 1.25 MG/0.5ML IN NEBU
1.2500 mg | INHALATION_SOLUTION | Freq: Three times a day (TID) | RESPIRATORY_TRACT | Status: DC
  Administered 2015-05-28 – 2015-05-29 (×5): 1.25 mg via RESPIRATORY_TRACT
  Filled 2015-05-27 (×7): qty 0.5

## 2015-05-27 MED ORDER — FUROSEMIDE 10 MG/ML IJ SOLN
40.0000 mg | Freq: Every day | INTRAMUSCULAR | Status: DC
  Administered 2015-05-27 – 2015-05-31 (×5): 40 mg via INTRAVENOUS
  Filled 2015-05-27 (×6): qty 4

## 2015-05-27 NOTE — Progress Notes (Signed)
CARDIAC REHAB PHASE I   PRE:  Rate/Rhythm: 63 SR PACs, PVCs  BP:  Supine:   Sitting: 119/57  Standing:    SaO2: 97%RA  MODE:  Ambulation: 84 ft   POST:  Rate/Rhythm: 77  BP:  Supine:   Sitting: 131/60  Standing:    SaO2: 98%RA 1450-1520 Pt rocked to stand with asst x 2. Encouraged pt to adhere to sternal precautions. Walked 84 ft with asst x 2. Encouraged to keep feet close to walker. To recliner with call bell after walk. Encouraged IS and another walk tonight.   Jonathan Nuttingharlene Azalee Weimer, RN BSN  05/27/2015 3:11 PM

## 2015-05-28 LAB — COMPREHENSIVE METABOLIC PANEL
ALT: 31 U/L (ref 17–63)
AST: 34 U/L (ref 15–41)
Albumin: 2.2 g/dL — ABNORMAL LOW (ref 3.5–5.0)
Alkaline Phosphatase: 76 U/L (ref 38–126)
Anion gap: 8 (ref 5–15)
BUN: 11 mg/dL (ref 6–20)
CO2: 29 mmol/L (ref 22–32)
Calcium: 8.7 mg/dL — ABNORMAL LOW (ref 8.9–10.3)
Chloride: 97 mmol/L — ABNORMAL LOW (ref 101–111)
Creatinine, Ser: 0.65 mg/dL (ref 0.61–1.24)
GFR calc Af Amer: >60 mL/min (ref >60)
GFR calc non Af Amer: >60 mL/min (ref >60)
Glucose, Bld: 108 mg/dL — ABNORMAL HIGH (ref 65–99)
Potassium: 3.7 mmol/L (ref 3.5–5.1)
Sodium: 134 mmol/L — ABNORMAL LOW (ref 135–145)
Total Bilirubin: 0.6 mg/dL (ref 0.3–1.2)
Total Protein: 5.6 g/dL — ABNORMAL LOW (ref 6.5–8.1)

## 2015-05-28 LAB — GLUCOSE, CAPILLARY: GLUCOSE-CAPILLARY: 91 mg/dL (ref 65–99)

## 2015-05-28 LAB — CBC
HCT: 37.9 % — ABNORMAL LOW (ref 39.0–52.0)
Hemoglobin: 12.9 g/dL — ABNORMAL LOW (ref 13.0–17.0)
MCH: 29.2 pg (ref 26.0–34.0)
MCHC: 34 g/dL (ref 30.0–36.0)
MCV: 85.7 fL (ref 78.0–100.0)
Platelets: 312 10*3/uL (ref 150–400)
RBC: 4.42 MIL/uL (ref 4.22–5.81)
RDW: 13.4 % (ref 11.5–15.5)
WBC: 9.7 10*3/uL (ref 4.0–10.5)

## 2015-05-28 MED ORDER — AMIODARONE HCL 200 MG PO TABS
200.0000 mg | ORAL_TABLET | Freq: Every day | ORAL | Status: DC
  Administered 2015-05-28 – 2015-05-31 (×4): 200 mg via ORAL
  Filled 2015-05-28 (×4): qty 1

## 2015-05-28 MED ORDER — ZOLPIDEM TARTRATE 5 MG PO TABS: 5.0000 mg | ORAL_TABLET | Freq: Every evening | ORAL | Status: DC | PRN

## 2015-05-28 MED ORDER — DIPHENHYDRAMINE HCL 25 MG PO CAPS: 50.0000 mg | ORAL_CAPSULE | Freq: Every evening | ORAL | Status: DC | PRN

## 2015-05-28 MED ORDER — AQUAPHOR EX OINT
TOPICAL_OINTMENT | Freq: Every day | CUTANEOUS | Status: DC | PRN
  Filled 2015-05-28: qty 50

## 2015-05-28 MED ORDER — GERHARDT'S BUTT CREAM
TOPICAL_CREAM | Freq: Three times a day (TID) | CUTANEOUS | Status: DC
Start: 2015-05-28 — End: 2015-05-31
  Administered 2015-05-28: 16:00:00 via TOPICAL
  Administered 2015-05-28: 1 via TOPICAL
  Administered 2015-05-29 – 2015-05-30 (×4): via TOPICAL
  Filled 2015-05-28: qty 1

## 2015-05-28 NOTE — Progress Notes (Signed)
Received report on Jonathan Le.  Pt alert and oriented x 3.  Denies pain.  Skin to lower bottom pink - will apply  cream as ordered.  Ambulated to bathroom using walker and tolerated well.

## 2015-05-28 NOTE — Progress Notes (Signed)
CARDIAC REHAB PHASE I   PRE:  Rate/Rhythm: 80 SR PVCs  BP:  Supine:   Sitting: 116/40  Standing:    SaO2: 93%RA  MODE:  Ambulation: 170 ft   POST:  Rate/Rhythm: 94 SR PVCs  BP:  Supine:   Sitting: 148/74  Standing:    SaO2: 94%RA hall and room [phone] Pt rocked to stand with asst. Walked 170 ft on RA using rollator(his request) and we followed with wheelchair. Pt stated he has rollator at home. Pt sat at 85 ft to rest.Sats at 94% . To recliner after walk. Tolerated increase in distance well with some SOB.    Luetta Nuttingharlene Ilayda Toda, RN BSN  05/28/2015 9:46 AM

## 2015-05-28 NOTE — Progress Notes (Signed)
Physical Therapy Treatment Patient Details Name: Jonathan Le MRN: [id number] DOB: 04/01/1946 Today's Date: 05/28/2015    History of Present Illness 69 year old male has a history of hypertension, sleep apnea and morbid obesity. Pt with STEMI s/p CABG x 3    PT Comments    Patient continues to progress with mobility and gait, ambulating 5080' x2 with RW and min guard assist.  Will need f/u HHPT at discharge.  Follow Up Recommendations  Home health PT;Supervision/Assistance - 24 hour     Equipment Recommendations  Rolling walker with 5" wheels;3in1 (PT) (Bariatric)    Recommendations for Other Services OT consult     Precautions / Restrictions Precautions Precautions: Sternal;Fall Precaution Comments: Reviewed precautions with patient    Mobility  Bed Mobility               General bed mobility comments: Patient sitting EOB as PT entered room  Transfers Overall transfer level: Needs assistance Equipment used: Rolling walker (2 wheeled) Transfers: Sit to/from Stand Sit to Stand: Min assist         General transfer comment: Verbal cues for hand placement/precautions.  Patient able to use rocking motion and momentum to move to standing with min guard assist from bed and with min assist from lower recliner.  Ambulation/Gait Ambulation/Gait assistance: Min guard Ambulation Distance (Feet): 160 Feet (80' x 2 with one sitting rest break) Assistive device: Rolling walker (2 wheeled) Gait Pattern/deviations: Step-through pattern;Decreased stride length;Trunk flexed;Wide base of support Gait velocity: Decreased Gait velocity interpretation: Below normal speed for age/gender General Gait Details: Verbal cues to try to stand upright during gait.  Able to ambulate 3480' x2 with sitting rest break between.  HR 66 at rest and 80 with gait.   Stairs            Wheelchair Mobility    Modified Rankin (Stroke Patients Only)       Balance                                     Cognition Arousal/Alertness: Awake/alert Behavior During Therapy: WFL for tasks assessed/performed Overall Cognitive Status: Within Functional Limits for tasks assessed                      Exercises      General Comments        Pertinent Vitals/Pain Pain Assessment: No/denies pain    Home Living                      Prior Function            PT Goals (current goals can now be found in the care plan section) Progress towards PT goals: Progressing toward goals    Frequency  Min 3X/week    PT Plan Current plan remains appropriate    Co-evaluation             End of Session Equipment Utilized During Treatment: Gait belt Activity Tolerance: Patient tolerated treatment well Patient left: in bed;with call bell/phone within reach;with family/visitor present (sitting EOB)     Time: [id number] PT Time Calculation (min) (ACUTE ONLY): 27 min  Charges:  $Gait Training: 23-37 mins                    G Codes:      Vena AustriaDavis, Sofia Jaquith H 05/28/2015, 8:12 PM Durenda HurtSusan H. Earlene Plateravis,  PT, [place] Pager [phone]

## 2015-05-28 NOTE — Progress Notes (Addendum)
      [address]       [place] [id number]             [phone]      6 Days Post-Op Procedure(s) (LRB): CORONARY ARTERY BYPASS GRAFTING (CABG), ON PUMP, TIMES THREE, USING LEFT INTERNAL MAMMARY ARTERY, RIGHT GREATER SAPHENOUS VEIN HARVESTED ENDOSCOPICALLY (N/A) TRANSESOPHAGEAL ECHOCARDIOGRAM (TEE) (N/A)   Subjective:  Mr. Lowell Guitarowell complains of pain on his bottom.  He states its raw from multiple bowel movements  Objective: Vital signs in last 24 hours: Temp:  [97.5 F (36.4 C)-98.4 F (36.9 C)] 97.5 F (36.4 C) (07/14 0403) Pulse Rate:  [55-72] 72 (07/14 0403) Cardiac Rhythm:  [-] Normal sinus rhythm (07/13 2214) Resp:  [18-22] 18 (07/14 0403) BP: (91-132)/(45-69) 121/62 mmHg (07/14 0403) SpO2:  [93 %-96 %] 93 % (07/14 0839) Weight:  [307 lb 1.6 oz (139.3 kg)] 307 lb 1.6 oz (139.3 kg) (07/14 0403)  Intake/Output from previous day: 07/13 0701 - 07/14 0700 In: 290 [P.O.:240; IV Piggyback:50] Out: 700 [Urine:700] Intake/Output this shift: Total I/O In: -  Out: 125 [Urine:125]  General appearance: alert, cooperative and no distress Heart: irregular Lungs: clear to auscultation bilaterally Abdomen: soft, non-tender; bowel sounds normal; no masses,  no organomegaly Extremities: edema 1-2+ Wound: clean and dry  Lab Results:  Recent Labs  05/27/15 0430 05/28/15 0445  WBC 7.2 9.7  HGB 11.7* 12.9*  HCT 34.6* 37.9*  PLT 236 312   BMET:  Recent Labs  05/27/15 0430 05/28/15 0445  NA 133* 134*  K 3.4* 3.7  CL 96* 97*  CO2 28 29  GLUCOSE 108* 108*  BUN 13 11  CREATININE 0.60* 0.65  CALCIUM 8.5* 8.7*    PT/INR: No results for input(s): LABPROT, INR in the last 72 hours. ABG    Component Value Date/Time   PHART 7.433 05/23/2015 0334   HCO3 23.7 05/23/2015 0334   TCO2 23 05/23/2015 1705   ACIDBASEDEF 1.0 05/22/2015 2159   O2SAT 55.5 05/26/2015 0500   CBG (last 3)   Recent Labs  05/27/15 1659 05/27/15 2158 05/28/15 0704  GLUCAP  117* 116* 91    Assessment/Plan: S/P Procedure(s) (LRB): CORONARY ARTERY BYPASS GRAFTING (CABG), ON PUMP, TIMES THREE, USING LEFT INTERNAL MAMMARY ARTERY, RIGHT GREATER SAPHENOUS VEIN HARVESTED ENDOSCOPICALLY (N/A) TRANSESOPHAGEAL ECHOCARDIOGRAM (TEE) (N/A)  1. CV- previous A. 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 Pulm- off oxygen, continue IS 3. Renal- creatinine WNL, weight is elevated continue Lasix 4. GI- irritation on his bottom from multiple BM, will add Gerhardts butt cream and aquaphor prn to help provide barrier 5. CBGs-controlled, patient not a diabetic will d/c fingersticks 6. Dispo- patient is stable, rhythm remains irregular ? Possibly developing 1st degree block will monitor, barrier creams for bottom   LOS: 9 days    BARRETT, ERIN 05/28/2015  Cardiac rhythm and bundle branch block are improving Heart rate now about 80 per minute Continue diuresis for leg edema as well as TEDs hose Probably home over the weekend with home health nurse for restorative care patient examined and medical record reviewed,agree with above note. Kathlee Nationseter Van Trigt III 05/28/2015

## 2015-05-28 NOTE — Progress Notes (Signed)
Contacted materials management and ortho tech and was unable to get a size of TED hoes larger than XL for pt.  Pt was uncomfortable in this size and is refusing to wear them.  Pt was educated to utilize SCDs when in bed and put his feet up when in the chair.  He is refusing to keep his feet up currently because he finds it uncomfortable as well.  Pt was educated and verbalized understanding.  PA notified.

## 2015-05-28 NOTE — Care Management Important Message (Signed)
Important Message  Patient Details  Name: Jonathan Le MRN: [id number] Date of Birth: 04/26/1946   Medicare Important Message Given:  Yes-fourth notification given    [name] 05/28/2015, 2:16 PM

## 2015-05-29 LAB — CBC
HCT: 36.1 % — ABNORMAL LOW (ref 39.0–52.0)
Hemoglobin: 12.1 g/dL — ABNORMAL LOW (ref 13.0–17.0)
MCH: 29 pg (ref 26.0–34.0)
MCHC: 33.5 g/dL (ref 30.0–36.0)
MCV: 86.6 fL (ref 78.0–100.0)
Platelets: 342 10*3/uL (ref 150–400)
RBC: 4.17 MIL/uL — ABNORMAL LOW (ref 4.22–5.81)
RDW: 13.6 % (ref 11.5–15.5)
WBC: 10.2 10*3/uL (ref 4.0–10.5)

## 2015-05-29 LAB — COMPREHENSIVE METABOLIC PANEL
ALT: 36 U/L (ref 17–63)
AST: 42 U/L — ABNORMAL HIGH (ref 15–41)
Albumin: 2.2 g/dL — ABNORMAL LOW (ref 3.5–5.0)
Alkaline Phosphatase: 76 U/L (ref 38–126)
Anion gap: 8 (ref 5–15)
BUN: 10 mg/dL (ref 6–20)
CO2: 29 mmol/L (ref 22–32)
Calcium: 8.7 mg/dL — ABNORMAL LOW (ref 8.9–10.3)
Chloride: 98 mmol/L — ABNORMAL LOW (ref 101–111)
Creatinine, Ser: 0.63 mg/dL (ref 0.61–1.24)
GFR calc Af Amer: >60 mL/min (ref >60)
GFR calc non Af Amer: >60 mL/min (ref >60)
Glucose, Bld: 149 mg/dL — ABNORMAL HIGH (ref 65–99)
Potassium: 3.3 mmol/L — ABNORMAL LOW (ref 3.5–5.1)
Sodium: 135 mmol/L (ref 135–145)
Total Bilirubin: 0.6 mg/dL (ref 0.3–1.2)
Total Protein: 5.2 g/dL — ABNORMAL LOW (ref 6.5–8.1)

## 2015-05-29 MED ORDER — POTASSIUM CHLORIDE 10 MEQ/50ML IV SOLN
10.0000 meq | INTRAVENOUS | Status: AC
  Administered 2015-05-29 (×2): 10 meq via INTRAVENOUS
  Filled 2015-05-29 (×2): qty 50

## 2015-05-29 MED ORDER — POTASSIUM CHLORIDE CRYS ER 20 MEQ PO TBCR
30.0000 meq | EXTENDED_RELEASE_TABLET | Freq: Once | ORAL | Status: AC
  Administered 2015-05-29: 30 meq via ORAL
  Filled 2015-05-29: qty 1

## 2015-05-29 MED ORDER — DIPHENHYDRAMINE-ZINC ACETATE 2-0.1 % EX CREA
TOPICAL_CREAM | Freq: Two times a day (BID) | CUTANEOUS | Status: DC | PRN
  Administered 2015-05-29: 1 via TOPICAL
  Administered 2015-05-30 (×2): via TOPICAL
  Filled 2015-05-29 (×2): qty 28

## 2015-05-29 MED ORDER — POTASSIUM CHLORIDE CRYS ER 20 MEQ PO TBCR
20.0000 meq | EXTENDED_RELEASE_TABLET | Freq: Two times a day (BID) | ORAL | Status: DC
Start: 2015-05-29 — End: 2015-05-29

## 2015-05-29 MED ORDER — DOXYCYCLINE HYCLATE 100 MG PO TABS
100.0000 mg | ORAL_TABLET | Freq: Two times a day (BID) | ORAL | Status: DC
  Administered 2015-05-29 (×2): 100 mg via ORAL
  Filled 2015-05-29 (×4): qty 1

## 2015-05-29 MED ORDER — LEVALBUTEROL HCL 1.25 MG/0.5ML IN NEBU
1.2500 mg | INHALATION_SOLUTION | Freq: Four times a day (QID) | RESPIRATORY_TRACT | Status: DC | PRN
  Filled 2015-05-29: qty 0.5

## 2015-05-29 MED ORDER — POTASSIUM CHLORIDE CRYS ER 20 MEQ PO TBCR
20.0000 meq | EXTENDED_RELEASE_TABLET | Freq: Every day | ORAL | Status: DC
Start: 2015-05-29 — End: 2015-05-31
  Administered 2015-05-29 – 2015-05-31 (×3): 20 meq via ORAL
  Filled 2015-05-29 (×5): qty 1

## 2015-05-29 NOTE — Discharge Summary (Signed)
Physician Discharge Summary       [address]       [place] [address]             [phone]    Patient ID: Jonathan Le MRN: [id number] DOB/AGE: November 27, 1945 69 y.o.  Admit date: 05/19/2015 Discharge date: 05/31/2015  Admission Diagnoses: 1. S/p NSTEMI 2. CAD 3. History of OSA (on CPAP) 4. Cardiomyopathy, ischemic 5. History of morbid obesity 6. Acute systolic congestive heart failure, NYHA class 3 7. History of hypertension 8. History of BPH  Discharge Diagnoses:  1. S/p NSTEMI 2. CAD 3. History of OSA (on CPAP) 4. Cardiomyopathy, ischemic 5. History of morbid obesity 6. Acute systolic congestive heart failure, NYHA class 3 7. History of hypertension 8. History of BPH 9. Mild ABL anemia 10. Post op a fib (converted to SR)  Procedure (s):  Cardiac catheterization by Dr. Clifton James on 05/20/2015:  Prox RCA lesion, 30% stenosed.  Mid RCA to Dist RCA lesion, 30% stenosed.  RPDA lesion, 95% stenosed.  Prox LAD lesion, 100% stenosed.  3rd Mrg lesion, 30% stenosed.  2nd Mrg lesion, 90% stenosed.  1. Severe triple vessel CAD with total occlusion of the LAD (likely culprit for the NSTEMI with event occurring over 48 hours ago), severe disease in OM1 and PDA, both large vessels.  2. Ischemic cardiomyopathy based on findings on echo 3. NSTEMI 1. Coronary artery bypass grafting x3 (left internal mammary artery to left anterior descending artery, saphenous vein graft to obtuse marginal, saphenous vein graft to posterior descending). 2. Endoscopic harvest of right leg greater saphenous vein by Dr. Donata Clay on 05/22/2015.  History of Presenting Illness: This 68 year old male has a history of hypertension, sleep apnea and morbid obesity. He developed sharp chest pain in his back and tingling of his left arm Saturday night and some shoulder pain. He thought it might be gas and got a large soda drank that which caused some burping and some relief of his  pain. Since then he had generalized fatigue loss of appetite and developed exertional shortness of breath. He developed sweating and nausea on an intermittent basis with early satiety. He was out eating with his wife this evening in when he pushed the plate back and said he could not eat anymore and because of the shortness of breath wife wanted to bring him to the emergency room. He was complaining of mild headache at that time. He was brought to the emergency room at [facility] and a troponin returned positive at 30. His EKG showed a left bundle branch block type pattern but with poor R-wave progression there was a question of ST elevation in leads 1 and aVL in the presence of a left bundle. He was [facility] to [facility] in order to undergo a cardiac catheterization. This was done on 07/06. Results as above. A cardiothoracic consultation was obtained with Dr. Donata Clay for the consideration of coronary artery bypass grafting surgery. Pre operative duplex carotid US showed no significant carotid artery stenosis. He underwent a CABG x 3 on 05/22/2015.  Brief Hospital Course:  The patient was extubated the evening of surgery without difficulty. He remained afebrile and hemodynamically stable. He was weaned off Milrinone, Dopamine, and Levophed drips. Theone Murdoch and chest tubes were removed early in the post operative course. A line clotted and also was removed the evening of surgery.Foley remained for a few days and then was removed. He was put on Lopressor. He went  into a fib with RVR. Cardiology was consulted. He was put on an Amiodarone drip. He then converted to sinus rhythm. Amiodarone drip was stopped and he was put on oral Amiodarone.  He became bradycardic. Lopressor was stopped and he was put on Zebeta.  However his bradycardia worsened.  There was also development of early AV block so this was discontinued.  He is maintaining NSR at this time and Beta Blocker may be able to be resumed  in the future once patient is off Amiodarone.  He was volume over loaded and diuresed. He had ABL anemia. He did not require a post op transfusion. His last H and H was 12.1 and 36.1. He was weaned off the insulin drip. The patient's glucose remained well controlled. The patient's HGA1C pre op was 6. He will need further surveillance of his HGA1C with his medical doctor as an outpatient.  He had a PICC line placed for poor venous access. The patient was felt surgically stable for transfer from the ICU to [facility] for further convalescence on 05/27/2015. He continues to progress with cardiac rehab. He was ambulating on room air. He has been tolerating a diet and has had a bowel movement. He had some sternal drainage and was placed on Doxycycline.  However, he developed a drug reaction to this.  This medication was discontinued and no further ABX were prescribed.  His sternal drainage has resolved with no evidence of infection present.  Epicardial pacing wires and chest tube sutures will be removed prior to discharge. The patient is felt surgically stable for discharge today.   Latest Vital Signs: Blood pressure 131/56, pulse 71, temperature 97.8 F (36.6 C), temperature source Oral, resp. rate 20, height 5\' 9"  (1.753 m), weight 304 lb 6.4 oz (138.075 kg), SpO2 96 %.  Physical Exam: Cardiovascular: RRR Pulmonary: Clear to auscultation bilaterally; no rales, wheezes, or rhonchi. Abdomen: Soft, non tender, bowel sounds present. Extremities: Bilateral lower extremity edema. Wounds: Minor sero sang drainage sternal wound. Slight erythema proximal sternal wound.  Discharge Condition:Stable and discharged to home.  Recent laboratory studies:  Lab Results  Component Value Date   WBC 10.7* 05/30/2015   HGB 12.7* 05/30/2015   HCT 37.8* 05/30/2015   MCV 85.7 05/30/2015   PLT 410* 05/30/2015   Lab Results  Component Value Date   NA 136 05/30/2015   K 3.8 05/30/2015   CL 99* 05/30/2015   CO2 28  05/30/2015   CREATININE 0.68 05/30/2015   GLUCOSE 110* 05/30/2015    Diagnostic Studies: Dg Chest 2 View  05/27/2015   CLINICAL DATA:  CABG.  Shortness of breath.  EXAM: CHEST  2 VIEW  COMPARISON:  05/26/2015.  05/19/2015.  FINDINGS: Right PICC line in stable position. Prior CABG. Cardiomegaly. Interim partial clearing of left base atelectasis and/or infiltrate. Persistent density in the periphery of the left mid lung. This could represent atelectasis, pneumonia, or infarct. Small pleural effusions.  IMPRESSION: 1. Right PICC line stable position. 2. Prior CABG. Stable cardiomegaly. No pulmonary venous congestion. 3. Interim improvement of left base subsegmental atelectasis. Small bilateral pleural effusions. 4. Persistent peripheral density in the left mid lung field. This could represent prominent area of atelectasis, pneumonia, or a pulmonary infarct.   Electronically Signed By: Maisie Fus  Register   On: 05/27/2015 07:38   Discharge Medications:    Medication List    STOP taking these medications        bisoprolol 5 MG tablet  Commonly known as:  ZEBETA  chlorthalidone 50 MG tablet  Commonly known as:  HYGROTON     naproxen 500 MG tablet  Commonly known as:  NAPROSYN      TAKE these medications        acetaminophen 500 MG tablet  Commonly known as:  TYLENOL  Take 1,500 mg by mouth every 6 (six) hours as needed for headache.     ALPRAZolam 1 MG tablet  Commonly known as:  XANAX  Take 1 tablet (1 mg total) by mouth 2 (two) times daily.     amiodarone 200 MG tablet  Commonly known as:  PACERONE  Take 1 tablet (200 mg total) by mouth daily.     aspirin 325 MG EC tablet  Take 1 tablet (325 mg total) by mouth daily.     atorvastatin 80 MG tablet  Commonly known as:  LIPITOR  Take 1 tablet (80 mg total) by mouth daily at 6 PM.     diphenhydrAMINE 25 mg capsule  Commonly known as:  BENADRYL  Take 1 capsule (25 mg total) by mouth every 6 (six) hours as needed for itching. May  also take nightly for sleep     finasteride 5 MG tablet  Commonly known as:  PROSCAR  Take 5 mg by mouth daily.     furosemide 40 MG tablet  Commonly known as:  LASIX  Take 1 tablet (40 mg total) by mouth daily. For 14 days     lisinopril 5 MG tablet  Commonly known as:  PRINIVIL,ZESTRIL  Take 1 tablet (5 mg total) by mouth daily.     oxyCODONE 5 MG immediate release tablet  Commonly known as:  Oxy IR/ROXICODONE  Take 1-2 tablets (5-10 mg total) by mouth every 3 (three) hours as needed for severe pain.     PHYTOCILLIN Caps  Take 1 capsule by mouth daily.     potassium chloride SA 20 MEQ tablet  Commonly known as:  K-DUR,KLOR-CON  Take 1 tablet (20 mEq total) by mouth daily. For 14 days         The patient has been discharged on:   1.Beta Blocker:  Yes [   ]                              No   [ x  ]                              If No, reason:Bradycardia  2.Ace Inhibitor/ARB: Yes [ x  ]                                     No  [    ]                                     If No, reason:  3.Statin:   Yes [ x  ]                  No  [   ]                  If No, reason:  4.Ecasa:  Yes  [ x  ]  No   [   ]                  If No, reason:  Follow Up Appointments: Follow-up Information    Follow up with W Viann FishSpencer Tilley, MD.   Specialty:  Cardiology   Why:  Call for a follow up appointment for 2 weeks   Contact information:   [address] [place] [address] [phone]       Follow up with Kerin PernaPeter Van Trigt III, MD On 07/01/2015.   Specialty:  Cardiothoracic Surgery   Why:  PA[facility]LAT CX[facility] taken [facility] which is in the same building as Dr. Zenaida NieceVan Trigt's office) on 07/01/2015 at 11:15 am ;Appointment time with Dr. Donata ClayVan Trigt is at 12:00 pm   Contact information:   [address] [place] [address] [phone]       Follow up with Elizabeth PalauANDERSON,TERESA, FNP.   Specialty:  Nurse Practitioner   Why:  Call  for a follow up appointment regarding further surveillance of HGA1C 6 (pre diabetes)   Contact information:   [address] [place] [address] [phone]       Signed: Marrion CoyBARRETT, ERINPA-C 05/31/2015, 9:45 AM

## 2015-05-29 NOTE — Progress Notes (Signed)
7 Days Post-Op Procedure(s) (LRB): CORONARY ARTERY BYPASS GRAFTING (CABG), ON PUMP, TIMES THREE, USING LEFT INTERNAL MAMMARY ARTERY, RIGHT GREATER SAPHENOUS VEIN HARVESTED ENDOSCOPICALLY (N/A) TRANSESOPHAGEAL ECHOCARDIOGRAM (TEE) (N/A) Subjective: Sinus rhytmn with PVC, PJC's- on lovenox 40 , no coumadin Serous drainage from lower sternum- start po doxycycline DC EPWs today- home when sternum dry on po lasix  Objective: Vital signs in last 24 hours: Temp:  [98 F (36.7 C)-98.2 F (36.8 C)] 98 F (36.7 C) (07/15 0601) Pulse Rate:  [60-65] 60 (07/15 0601) Cardiac Rhythm:  [-] Normal sinus rhythm (07/14 2105) Resp:  [17-18] 18 (07/15 0601) BP: (100-112)/(41-56) 112/53 mmHg (07/15 0601) SpO2:  [90 %-98 %] 94 % (07/15 0738) Weight:  [301 lb (136.533 kg)] 301 lb (136.533 kg) (07/15 0601)  Hemodynamic parameters for last 24 hours:  afebrile  Intake/Output from previous day: 07/14 0701 - 07/15 0700 In: 600 [P.O.:600] Out: 400 [Urine:400] Intake/Output this shift:    3+ leg edema Sternum w/o click  Lab Results:  Recent Labs  05/28/15 0445 05/29/15 0530  WBC 9.7 10.2  HGB 12.9* 12.1*  HCT 37.9* 36.1*  PLT 312 342   BMET:  Recent Labs  05/28/15 0445 05/29/15 0530  NA 134* 135  K 3.7 3.3*  CL 97* 98*  CO2 29 29  GLUCOSE 108* 149*  BUN 11 10  CREATININE 0.65 0.63  CALCIUM 8.7* 8.7*    PT/INR: No results for input(s): LABPROT, INR in the last 72 hours. ABG    Component Value Date/Time   PHART 7.433 05/23/2015 0334   HCO3 23.7 05/23/2015 0334   TCO2 23 05/23/2015 1705   ACIDBASEDEF 1.0 05/22/2015 2159   O2SAT 55.5 05/26/2015 0500   CBG (last 3)   Recent Labs  05/27/15 1659 05/27/15 2158 05/28/15 0704  GLUCAP 117* 116* 91    Assessment/Plan: S/P Procedure(s) (LRB): CORONARY ARTERY BYPASS GRAFTING (CABG), ON PUMP, TIMES THREE, USING LEFT INTERNAL MAMMARY ARTERY, RIGHT GREATER SAPHENOUS VEIN HARVESTED ENDOSCOPICALLY (N/A) TRANSESOPHAGEAL ECHOCARDIOGRAM  (TEE) (N/A) Mobilize Diuresis d/c pacing wires start po doxycycline for sternal drainage   LOS: 10 days    Kathlee Nationseter Van Trigt III 05/29/2015

## 2015-05-29 NOTE — Progress Notes (Signed)
Pt HR dropped to 35-40 for a very short amount of time and took a minute to come back up to 60-65 bpm.  Notified PA and was ordered to keep pacing wires in until tomorrow. Will notify pt we will be keeping them in until tomorrow.

## 2015-05-29 NOTE — Progress Notes (Addendum)
      [address]       [place] [id number]             [phone]        7 Days Post-Op Procedure(s) (LRB): CORONARY ARTERY BYPASS GRAFTING (CABG), ON PUMP, TIMES THREE, USING LEFT INTERNAL MAMMARY ARTERY, RIGHT GREATER SAPHENOUS VEIN HARVESTED ENDOSCOPICALLY (N/A) TRANSESOPHAGEAL ECHOCARDIOGRAM (TEE) (N/A)  Subjective: Patient states his "bottom is raw" but no other complaints.  Objective: Vital signs in last 24 hours: Temp:  [98 F (36.7 C)-98.2 F (36.8 C)] 98 F (36.7 C) (07/15 0601) Pulse Rate:  [60-65] 60 (07/15 0601) Cardiac Rhythm:  [-] Normal sinus rhythm (07/14 2105) Resp:  [17-18] 18 (07/15 0601) BP: (100-112)/(41-56) 112/53 mmHg (07/15 0601) SpO2:  [90 %-98 %] 94 % (07/15 0738) Weight:  [301 lb (136.533 kg)] 301 lb (136.533 kg) (07/15 0601)  Pre op weight 131 kg Current Weight  05/29/15 301 lb (136.533 kg)      Intake/Output from previous day: 07/14 0701 - 07/15 0700 In: 600 [P.O.:600] Out: 400 [Urine:400]   Physical Exam:  Cardiovascular: RRR Pulmonary: Clear to auscultation bilaterally; no rales, wheezes, or rhonchi. Abdomen: Soft, non tender, bowel sounds present. Extremities: Bilateral lower extremity edema. Wounds: Minor sero sang drainage sternal wound.  Slight erythema proximal sternal wound.  Lab Results: CBC: Recent Labs  05/28/15 0445 05/29/15 0530  WBC 9.7 10.2  HGB 12.9* 12.1*  HCT 37.9* 36.1*  PLT 312 342   BMET:  Recent Labs  05/28/15 0445 05/29/15 0530  NA 134* 135  K 3.7 3.3*  CL 97* 98*  CO2 29 29  GLUCOSE 108* 149*  BUN 11 10  CREATININE 0.65 0.63  CALCIUM 8.7* 8.7*    PT/INR:  Lab Results  Component Value Date   INR 1.48 05/22/2015   INR 1.07 05/19/2015   ABG:  INR: Will add last result for INR, ABG once components are confirmed Will add last 4 CBG results once components are confirmed  Assessment/Plan:  1. CV - SR/PVCs in the 70's. On Bisoprolol 2.5 mg daily and Amiodarone 200 mg  daily.  2.  Pulmonary - On room air. Encourage incentive spirometer. 3. Volume Overload - On Lasix 40 mg IV 4.  Acute blood loss anemia - H and H stable at 12.1 and 36.1 5. Supplement potassium 6. Monitor minor sternal drainage. Dr. Donata ClayVan Trigt started oral Doxycycline. 7. Remove EPW 8. Likely discharge sometime this weekend.   ZIMMERMAN,DONIELLE MPA-C 05/29/2015,8:44 AM

## 2015-05-29 NOTE — Discharge Instructions (Signed)
Activity: 1.May walk up steps                2.No lifting more than ten pounds for four weeks.                 3.No driving for four weeks.                4.Stop any activity that causes chest pain, shortness of breath, dizziness,  sweating or excessive weakness.                5.Avoid straining.                6.Continue with your breathing exercises daily.  Diet: Diabetic, low fat, Low cholesterol diet  Wound Care: May shower.  Clean wounds with mild soap and water daily. Contact the office at [phone] if any problems arise.  Coronary Artery Bypass Grafting, Care After Refer to this sheet in the next few weeks. These instructions provide you with information on caring for yourself after your procedure. Your health care provider may also give you more specific instructions. Your treatment has been planned according to current medical practices, but problems sometimes occur. Call your health care provider if you have any problems or questions after your procedure. WHAT TO EXPECT AFTER THE PROCEDURE Recovery from surgery will be different for everyone. Some people feel well after 3 or 4 weeks, while for others it takes longer. After your procedure, it is typical to have the following:  Nausea and a lack of appetite.   Constipation.  Weakness and fatigue.   Depression or irritability.   Pain or discomfort at your incision site. HOME CARE INSTRUCTIONS  Take medicines only as directed by your health care provider. Do not stop taking medicines or start any new medicines without first checking with your health care provider.  Take your pulse as directed by your health care provider.  Perform deep breathing as directed by your health care provider. If you were given a device called an incentive spirometer, use it to practice deep breathing several times a day. Support your chest with a pillow or your arms when you take deep breaths or cough.  Keep incision areas clean, dry, and  protected. Remove or change any bandages (dressings) only as directed by your health care provider. You may have skin adhesive strips over the incision areas. Do not take the strips off. They will fall off on their own.  Check incision areas daily for any swelling, redness, or drainage.  If incisions were made in your legs, do the following:  Avoid crossing your legs.   Avoid sitting for long periods of time. Change positions every 30 minutes.   Elevate your legs when you are sitting.  Wear compression stockings as directed by your health care provider. These stockings help keep blood clots from forming in your legs.  Take showers once your health care provider approves. Until then, only take sponge baths. Pat incisions dry. Do not rub incisions with a washcloth or towel. Do not take baths, swim, or use a hot tub until your health care provider approves.  Eat foods that are high in fiber, such as raw fruits and vegetables, whole grains, beans, and nuts. Meats should be lean cut. Avoid canned, processed, and fried foods.  Drink enough fluid to keep your urine clear or pale yellow.  Weigh yourself every day. This helps identify if you are retaining fluid that may make your heart and lungs work  harder.  Rest and limit activity as directed by your health care provider. You may be instructed to:  Stop any activity at once if you have chest pain, shortness of breath, irregular heartbeats, or dizziness. Get help right away if you have any of these symptoms.  Move around frequently for short periods or take short walks as directed by your health care provider. Increase your activities gradually. You may need physical therapy or cardiac rehabilitation to help strengthen your muscles and build your endurance.  Avoid lifting, pushing, or pulling anything heavier than 10 lb (4.5 kg) for at least 6 weeks after surgery.  Do not drive until your health care provider approves.  Ask your health  care provider when you may return to work.  Ask your health care provider when you may resume sexual activity.  Keep all follow-up visits as directed by your health care provider. This is important. SEEK MEDICAL CARE IF:  You have swelling, redness, increasing pain, or drainage at the site of an incision.  You have a fever.  You have swelling in your ankles or legs.  You have pain in your legs.   You gain 2 or more pounds (0.9 kg) a day.  You are nauseous or vomit.  You have diarrhea. SEEK IMMEDIATE MEDICAL CARE IF:  You have chest pain that goes to your jaw or arms.  You have shortness of breath.   You have a fast or irregular heartbeat.   You notice a "clicking" in your breastbone (sternum) when you move.   You have numbness or weakness in your arms or legs.  You feel dizzy or light-headed.  MAKE SURE YOU:  Understand these instructions.  Will watch your condition.  Will get help right away if you are not doing well or get worse. Document Released: 05/20/2005 Document Revised: 03/17/2014 Document Reviewed: 04/09/2013 [facility] Patient Information 2015 [place], [place]. This information is not intended to replace advice given to you by your health care provider. Make sure you discuss any questions you have with your health care provider.

## 2015-05-29 NOTE — Progress Notes (Signed)
CARDIAC REHAB PHASE I   PRE:  Rate/Rhythm: 72 SR  BP:  Sitting: 111/57        SaO2: 95 RA  MODE:  Ambulation: 170 ft   POST:  Rate/Rhythm: 95 SR c/PVCs  BP:  Sitting: 132/58         SaO2: 94 RA  Pt sitting up in chair, legs in dependent position with significant BLE edema.  Pt states he couldn't fine TED hose that fit him and they were "cutting off my circulation." Pt states his knees feel weak today upon standing. Pt ambulated 170 ft on RA, assist x 2, rollator, mildly unsteady gait, tolerated well. Pt reports mild DOE, denies CP, dizziness, sitting rest x1 at halfway point. Pt does report increased lower extremity weakness today. Pt has some difficulty standing, states his recliner at home is very low and he anticipates difficulty standing from sitting position when home. Pt states he is interested in a lift chair if possible. Spoke with case manager, pt will need MD order for lift chair. Pt also interested in rollator, he has one at home he thinks that was his mother's but is too small for him. Pt will need large rollator at discharge. Case manager aware. Pt to recliner after walk, feet elevated, call bell within reach. Pt spoke with his wife, she will be here tomorrow at 1430 to review education.   [phone]  Joylene GrapesMonge, Syrita Dovel C, RN, BSN 05/29/2015 11:30 AM

## 2015-05-29 NOTE — Plan of Care (Signed)
Problem: Phase II Progression Outcomes Goal: Ambulates up to 600 ft. in hall x 1 Outcome: Progressing Patient ambulated ~17400ft in the hallway this shift using front wheel walker. One rest break sitting on wheel chair for about 3 minutes.

## 2015-05-29 NOTE — Progress Notes (Signed)
Pt has been scratching his sternal incision with his bare hands.  Pt appears to have slight rash around incision from scratching.  Pt has been educated several times on the risk for infection in doing this, but has not complied.  PA was notified and benadryl ointment was ordered and applied to the area surrounding the incision.  Pt still continues to directly touch this incision.  Pt also is still refusing to keep his legs elevated.  Pt was reeducated but failing to comply.

## 2015-05-29 NOTE — Care Management Note (Signed)
Case Management Note  Patient Details  Name: Jonathan Le MRN: [id number] Date of Birth: 06/29/1946  Subjective/Objective:    Pt s/p CABGx3                Action/Plan: PTA pt lived at home with spouse- per PT notes- [facility] will need HH order with [facility] prior to discharge - pt would also like DME- bariatric 3n1 and large rollator-  NCM to follow for once orders placed  Expected Discharge Date:                  Expected Discharge Plan:  Home w Home Health Services  In-House Referral:     Discharge planning Services  CM Consult  Post Acute Care Choice:    Choice offered to:     DME Arranged:    DME Agency:     HH Arranged:    HH Agency:     Status of Service:  In process, will continue to follow  Medicare Important Message Given:  Yes-fourth notification given Date Medicare IM Given:    Medicare IM give by:    Date Additional Medicare IM Given:    Additional Medicare Important Message give by:     If discussed at Long Length of Stay Meetings, dates discussed:    Additional Comments:  Jonathan Le, Jonathan Fielding Hall, RN 05/29/2015, 4:30 PM

## 2015-05-30 LAB — COMPREHENSIVE METABOLIC PANEL
ALT: 42 U/L (ref 17–63)
AST: 46 U/L — ABNORMAL HIGH (ref 15–41)
Albumin: 2.3 g/dL — ABNORMAL LOW (ref 3.5–5.0)
Alkaline Phosphatase: 79 U/L (ref 38–126)
Anion gap: 9 (ref 5–15)
BUN: 11 mg/dL (ref 6–20)
CO2: 28 mmol/L (ref 22–32)
Calcium: 9 mg/dL (ref 8.9–10.3)
Chloride: 99 mmol/L — ABNORMAL LOW (ref 101–111)
Creatinine, Ser: 0.68 mg/dL (ref 0.61–1.24)
GFR calc Af Amer: >60 mL/min (ref >60)
GFR calc non Af Amer: >60 mL/min (ref >60)
Glucose, Bld: 110 mg/dL — ABNORMAL HIGH (ref 65–99)
Potassium: 3.8 mmol/L (ref 3.5–5.1)
Sodium: 136 mmol/L (ref 135–145)
Total Bilirubin: 0.7 mg/dL (ref 0.3–1.2)
Total Protein: 5.6 g/dL — ABNORMAL LOW (ref 6.5–8.1)

## 2015-05-30 LAB — CBC
HCT: 37.8 % — ABNORMAL LOW (ref 39.0–52.0)
Hemoglobin: 12.7 g/dL — ABNORMAL LOW (ref 13.0–17.0)
MCH: 28.8 pg (ref 26.0–34.0)
MCHC: 33.6 g/dL (ref 30.0–36.0)
MCV: 85.7 fL (ref 78.0–100.0)
Platelets: 410 10*3/uL — ABNORMAL HIGH (ref 150–400)
RBC: 4.41 MIL/uL (ref 4.22–5.81)
RDW: 13.6 % (ref 11.5–15.5)
WBC: 10.7 10*3/uL — ABNORMAL HIGH (ref 4.0–10.5)

## 2015-05-30 MED ORDER — DIPHENHYDRAMINE HCL 50 MG/ML IJ SOLN
25.0000 mg | Freq: Four times a day (QID) | INTRAMUSCULAR | Status: DC | PRN
Start: 2015-05-30 — End: 2015-05-31
  Administered 2015-05-30: 25 mg via INTRAVENOUS
  Filled 2015-05-30 (×2): qty 1

## 2015-05-30 MED ORDER — FUROSEMIDE 10 MG/ML IJ SOLN
40.0000 mg | Freq: Once | INTRAMUSCULAR | Status: AC
  Administered 2015-05-30: 40 mg via INTRAVENOUS
  Filled 2015-05-30: qty 4

## 2015-05-30 NOTE — Progress Notes (Signed)
Pt requested education be completed this afternoon when wife arrives.  Pt wife unable to be present for the instructions.  Pt's cousin in at bedside visiting with pt.  Pt noted to doze intermittently during education session due to benadryl given for allergic reaction.  Reviewed heart healthy diet, exercise guidelines and restrictions, sternal precautions and when to notify MD.  Pt ambulated to bathroom x 2 during the education.  Handouts provided, questions answered.  Pt ok for name and number to be sent to [facility].  Pt in chair dozing , call bell in place. Alanson Alyarlette Carlton RN, BSN [phone]

## 2015-05-30 NOTE — Progress Notes (Signed)
Pt has developed rash on his back and tights. Pt stated That "maybe it's because he hadn't showered for days". Partial sponge bath was given in that area and applied the benadryl cream.

## 2015-05-30 NOTE — Progress Notes (Addendum)
      [address]       [place] [id number]             [phone]      8 Days Post-Op Procedure(s) (LRB): CORONARY ARTERY BYPASS GRAFTING (CABG), ON PUMP, TIMES THREE, USING LEFT INTERNAL MAMMARY ARTERY, RIGHT GREATER SAPHENOUS VEIN HARVESTED ENDOSCOPICALLY (N/A) TRANSESOPHAGEAL ECHOCARDIOGRAM (TEE) (N/A)   Subjective:  [name] has multiple complaints this morning.  He complains of itching along chest, back, sides, and legs.  He also states he is not sleeping and wants to go home today.  Finally he states he wants to take a shower today as well.  Objective: Vital signs in last 24 hours: Temp:  [98 F (36.7 C)-98.2 F (36.8 C)] 98 F (36.7 C) (07/16 0444) Pulse Rate:  [66-72] 72 (07/16 1017) Cardiac Rhythm:  [-] Normal sinus rhythm (07/15 1935) Resp:  [18] 18 (07/16 1017) BP: (100-112)/(31-55) 107/55 mmHg (07/16 1017) SpO2:  [92 %-95 %] 92 % (07/16 1017) Weight:  [306 lb (138.8 kg)] 306 lb (138.8 kg) (07/16 0444)  Intake/Output from previous day: 07/15 0701 - 07/16 0700 In: 720 [P.O.:720] Out: 1500 [Urine:1500] Intake/Output this shift: Total I/O In: 300 [P.O.:300] Out: 0   General appearance: alert, cooperative and no distress Heart: regular rate and rhythm Lungs: clear to auscultation bilaterally Abdomen: soft, non-tender; bowel sounds normal; no masses,  no organomegaly Extremities: edema trace Skin: trunk covered with hives and evidence of patient scratching,  Wound: clean, some serous drainage from lower portion of sternotomy  Lab Results:  Recent Labs  05/29/15 0530 05/30/15 0600  WBC 10.2 10.7*  HGB 12.1* 12.7*  HCT 36.1* 37.8*  PLT 342 410*   BMET:  Recent Labs  05/29/15 0530 05/30/15 0600  NA 135 136  K 3.3* 3.8  CL 98* 99*  CO2 29 28  GLUCOSE 149* 110*  BUN 10 11  CREATININE 0.63 0.68  CALCIUM 8.7* 9.0    PT/INR: No results for input(s): LABPROT, INR in the last 72 hours. ABG    Component Value Date/Time   PHART  7.433 05/23/2015 0334   HCO3 23.7 05/23/2015 0334   TCO2 23 05/23/2015 1705   ACIDBASEDEF 1.0 05/22/2015 2159   O2SAT 55.5 05/26/2015 0500   CBG (last 3)   Recent Labs  05/27/15 1659 05/27/15 2158 05/28/15 0704  GLUCAP 117* 116* 91    Assessment/Plan: S/P Procedure(s) (LRB): CORONARY ARTERY BYPASS GRAFTING (CABG), ON PUMP, TIMES THREE, USING LEFT INTERNAL MAMMARY ARTERY, RIGHT GREATER SAPHENOUS VEIN HARVESTED ENDOSCOPICALLY (N/A) TRANSESOPHAGEAL ECHOCARDIOGRAM (TEE) (N/A)  1. CV- Bradycardia yesterday, rate as low as the 30s, Beta blocker discontinued at that time- currently NSR rate in the 80s, will continue to hold Beta blocker, continue Amiodarone for previous A. Fib 2.Pulm-  No acute issues, continue IS 3. Renal-creatinine stable, continue Lasix 4. Sternal drainage- does not appear to be infected, however patient started on Doxycycline, however is appears to be having a drug reaction and this medication was stopped... No further ABX at this point, Benadryl IV for hives 5. Dispo- new drug reaction from Doxycyline... Benadryl IV prn, d/c EPW, possibly home in AM if remains stable   LOS: 11 days    BARRETT, ERIN 05/30/2015  I have seen and examined the patient and agree with the assessment and plan as outlined.  Needs more diuresis.  Extra dose lasix this afternoon  Purcell NailsClarence H Keygan Dumond 05/30/2015 12:49 PM

## 2015-05-31 ENCOUNTER — Inpatient Hospital Stay (HOSPITAL_COMMUNITY): Payer: Medicare Other

## 2015-05-31 ENCOUNTER — Other Ambulatory Visit: Payer: Self-pay | Admitting: Physician Assistant

## 2015-05-31 MED ORDER — LISINOPRIL 5 MG PO TABS: 5.0000 mg | ORAL_TABLET | Freq: Every day | ORAL | Status: DC

## 2015-05-31 MED ORDER — DIPHENHYDRAMINE HCL 25 MG PO CAPS: 25.0000 mg | ORAL_CAPSULE | Freq: Four times a day (QID) | ORAL | Status: DC | PRN

## 2015-05-31 MED ORDER — AMIODARONE HCL 200 MG PO TABS: 200.0000 mg | ORAL_TABLET | Freq: Every day | ORAL | Status: DC

## 2015-05-31 MED ORDER — ASPIRIN 325 MG PO TBEC: 325.0000 mg | DELAYED_RELEASE_TABLET | Freq: Every day | ORAL | Status: DC

## 2015-05-31 MED ORDER — POTASSIUM CHLORIDE CRYS ER 20 MEQ PO TBCR: 20.0000 meq | EXTENDED_RELEASE_TABLET | Freq: Every day | ORAL | Status: DC

## 2015-05-31 MED ORDER — OXYCODONE HCL 5 MG PO TABS: 5.0000 mg | ORAL_TABLET | ORAL | Status: DC | PRN

## 2015-05-31 MED ORDER — ATORVASTATIN CALCIUM 80 MG PO TABS: 80.0000 mg | ORAL_TABLET | Freq: Every day | ORAL | Status: DC

## 2015-05-31 MED ORDER — FUROSEMIDE 40 MG PO TABS: 40.0000 mg | ORAL_TABLET | Freq: Every day | ORAL | Status: DC

## 2015-05-31 MED ORDER — ALPRAZOLAM 1 MG PO TABS: 1.0000 mg | ORAL_TABLET | Freq: Two times a day (BID) | ORAL | Status: DC

## 2015-05-31 NOTE — Progress Notes (Signed)
05-31-15  1225  I.V. Team note :  PICC line removed per MD order @ 1115 ; No resistance met upon removal , PICC measured  42cms  in length. Computer documentation shows PICC was inserted to 47cms on 05-25-15.  Pt's R.N. was notified immediately and will notify MD. Pt. Showed no immediate signs or symptoms of distress.  Iona Coach , R.N. , I.V. Team

## 2015-05-31 NOTE — Progress Notes (Signed)
      [address]       [place] [id number]             [phone]      9 Days Post-Op Procedure(s) (LRB): CORONARY ARTERY BYPASS GRAFTING (CABG), ON PUMP, TIMES THREE, USING LEFT INTERNAL MAMMARY ARTERY, RIGHT GREATER SAPHENOUS VEIN HARVESTED ENDOSCOPICALLY (N/A) TRANSESOPHAGEAL ECHOCARDIOGRAM (TEE) (N/A)   Subjective:  Mr. Lowell Guitarowell has no new complaints.  He again states he wants to go home. + ambulation + BM  Objective: Vital signs in last 24 hours: Temp:  [97.8 F (36.6 C)-98.4 F (36.9 C)] 97.8 F (36.6 C) (07/17 0444) Pulse Rate:  [62-72] 71 (07/17 0444) Cardiac Rhythm:  [-] Normal sinus rhythm (07/16 2000) Resp:  [18-20] 20 (07/17 0444) BP: (100-131)/(47-64) 131/56 mmHg (07/17 0444) SpO2:  [92 %-96 %] 96 % (07/17 0444) Weight:  [304 lb 6.4 oz (138.075 kg)] 304 lb 6.4 oz (138.075 kg) (07/17 0444)  Intake/Output from previous day: 07/16 0701 - 07/17 0700 In: 1000 [P.O.:1000] Out: 1350 [Urine:1350] Intake/Output this shift: Total I/O In: 480 [P.O.:480] Out: 250 [Urine:250]  General appearance: alert, cooperative and no distress Heart: regular rate and rhythm Lungs: clear to auscultation bilaterally Abdomen: soft, non-tender; bowel sounds normal; no masses,  no organomegaly Extremities: edema 1-2+ Wound: clean and dry  Lab Results:  Recent Labs  05/29/15 0530 05/30/15 0600  WBC 10.2 10.7*  HGB 12.1* 12.7*  HCT 36.1* 37.8*  PLT 342 410*   BMET:  Recent Labs  05/29/15 0530 05/30/15 0600  NA 135 136  K 3.3* 3.8  CL 98* 99*  CO2 29 28  GLUCOSE 149* 110*  BUN 10 11  CREATININE 0.63 0.68  CALCIUM 8.7* 9.0    PT/INR: No results for input(s): LABPROT, INR in the last 72 hours. ABG    Component Value Date/Time   PHART 7.433 05/23/2015 0334   HCO3 23.7 05/23/2015 0334   TCO2 23 05/23/2015 1705   ACIDBASEDEF 1.0 05/22/2015 2159   O2SAT 55.5 05/26/2015 0500   CBG (last 3)  No results for input(s): GLUCAP in the last 72  hours.  Assessment/Plan: S/P Procedure(s) (LRB): CORONARY ARTERY BYPASS GRAFTING (CABG), ON PUMP, TIMES THREE, USING LEFT INTERNAL MAMMARY ARTERY, RIGHT GREATER SAPHENOUS VEIN HARVESTED ENDOSCOPICALLY (N/A) TRANSESOPHAGEAL ECHOCARDIOGRAM (TEE) (N/A)  1. CV- NSR, continue Amiodarone, will add low dose ACE for BP control 2. Pulm- no acute issues, continue IS 3. Renal- creatinine has been stable, remains hypervolemic, will continue Lasix at discharge 4. Sternal drainage- resolved, wound is cleaned, instructed patient on signs of infection.. Given recent drug reaction will not discharge home on ABX at this time 5. Dispo- patient stable, will d/c home today   LOS: 12 days    Maryrose Colvin 05/31/2015

## 2015-05-31 NOTE — Progress Notes (Signed)
Discharge Nursing Note: CABG discharge instructions discussed at length. Prescriptions given and future appointments. Pt. Had a stat CXR as well bc of the discrepancy between 47cm documented at time of placement and 42cm found at time of removal. CXR found no evidence of any remaining PICC line catheter. All questions addressed and phone numbers given to patient in case he has any more questions before his doctors' appointments.

## 2015-06-12 ENCOUNTER — Ambulatory Visit
Admission: RE | Admit: 2015-06-12 | Discharge: 2015-06-12 | Disposition: A | Discharge status: Home / Self Care | Payer: Medicare Other | Source: Ambulatory Visit | Attending: Cardiothoracic Surgery | Admitting: Cardiothoracic Surgery

## 2015-06-12 ENCOUNTER — Encounter: Payer: Self-pay | Admitting: Cardiothoracic Surgery

## 2015-06-12 ENCOUNTER — Ambulatory Visit (INDEPENDENT_AMBULATORY_CARE_PROVIDER_SITE_OTHER): Payer: Self-pay | Admitting: Cardiothoracic Surgery

## 2015-06-12 ENCOUNTER — Other Ambulatory Visit: Payer: Self-pay | Admitting: Cardiothoracic Surgery

## 2015-06-12 VITALS — BP 102/67 | HR 78 | Resp 16 | Ht 69.0 in | Wt 283.0 lb

## 2015-06-12 DIAGNOSIS — I214 Non-ST elevation (NSTEMI) myocardial infarction: Secondary | ICD-10-CM

## 2015-06-12 DIAGNOSIS — Z951 Presence of aortocoronary bypass graft: Secondary | ICD-10-CM

## 2015-06-12 DIAGNOSIS — I2510 Atherosclerotic heart disease of native coronary artery without angina pectoris: Secondary | ICD-10-CM

## 2015-06-12 NOTE — Progress Notes (Signed)
PCP is Elizabeth Palau, FNP Referring Provider is Othella Boyer, MD  Chief Complaint  Patient presents with  . Routine Post Op    s/p CABG X 3 05/22/15 with a cxr    WUJ:[id number] obese non diabetic nonsmoker returns for her first office visit after urgent CABG for acute MI and multivessel CABG Patient developed transient atrial fibrillation postop but converted to sinus rhythm. He developed redness consistent with cellulitis over his right pretibial area and was started on a course of oral Cipro with some improvement He still has significant bilateral pedal edema right greater than left His had no recurrent symptoms of CHF or angina Chest x-ray taken today shows no pleural effusions, sternal wires intact, mediastinal silhouette stable The patient is walking around the house with a rolling walker He is sleeping in a recliner. He has lost weight from 310 pounds down to 285 pounds.   Past Medical History  Diagnosis Date  . Anxiety   . Hypertension   . Arthritis   . Sleep apnea     wears CPAP nightly  . Glaucoma     Past Surgical History  Procedure Laterality Date  . Hernia repair      right  . Eye surgery      bil cataract  . Wisdom tooth extraction    . Fasciotomy foot / toe Left     foot  . Cardiac catheterization N/A 05/20/2015    Procedure: Left Heart Cath and Coronary Angiography;  Surgeon: Kathleene Hazel, MD;  Location: [facility] INVASIVE CV LAB;  Service: Cardiovascular;  Laterality: N/A;  . Coronary artery bypass graft N/A 05/22/2015    Procedure: CORONARY ARTERY BYPASS GRAFTING (CABG), ON PUMP, TIMES THREE, USING LEFT INTERNAL MAMMARY ARTERY, RIGHT GREATER SAPHENOUS VEIN HARVESTED ENDOSCOPICALLY;  Surgeon: Kerin Perna, MD;  Location: [facility] OR;  Service: Open Heart Surgery;  Laterality: N/A;  LIMA-LAD, SVG-OM, SVG-PD  . Tee without cardioversion N/A 05/22/2015    Procedure: TRANSESOPHAGEAL ECHOCARDIOGRAM (TEE);  Surgeon: Kerin Perna, MD;  Location: [facility] OR;  Service:  Open Heart Surgery;  Laterality: N/A;    No family history on file.  Social History History  Substance Use Topics  . Smoking status: Never Smoker   . Smokeless tobacco: Never Used  . Alcohol Use: No    Current Outpatient Prescriptions  Medication Sig Dispense Refill  . acetaminophen (TYLENOL) 500 MG tablet Take 1,500 mg by mouth every 6 (six) hours as needed for headache.    . ALPRAZolam (XANAX) 1 MG tablet Take 1 tablet (1 mg total) by mouth 2 (two) times daily. 10 tablet 0  . amiodarone (PACERONE) 200 MG tablet Take 1 tablet (200 mg total) by mouth daily. 30 tablet 1  . aspirin EC 325 MG EC tablet Take 1 tablet (325 mg total) by mouth daily. 30 tablet 0  . atorvastatin (LIPITOR) 80 MG tablet Take 1 tablet (80 mg total) by mouth daily at 6 PM. 30 tablet 3  . finasteride (PROSCAR) 5 MG tablet Take 5 mg by mouth daily.    . furosemide (LASIX) 40 MG tablet Take 1 tablet (40 mg total) by mouth daily. For 14 days 14 tablet 0  . lisinopril (PRINIVIL,ZESTRIL) 5 MG tablet Take 1 tablet (5 mg total) by mouth daily. 30 tablet 3  . potassium chloride SA (K-DUR,KLOR-CON) 20 MEQ tablet Take 1 tablet (20 mEq total) by mouth daily. For 14 days 14 tablet 0   No current facility-administered medications for this visit.  Allergies  Allergen Reactions  . Penicillins Hives  . Doxycycline Rash    Review of Systems  He denies fever He denies shortness of breath He complains mainly of his leg swelling and redness consistent with cellulitis Surgical incisions are not draining  BP 102/67 mmHg  Pulse 78  Resp 16  Ht  (1.753 m)  Wt 283 lb (128.368 kg)  BMI 41.77 kg/m2  SpO2 98% Physical Exam Alert and comfortable Lungs clear Sternal healing well stable Heart rhythm regular murmur Bilateral pedal edema right greater than left Erythema consistent with cellulitis of her right pretibial area extending down to ankle  Diagnostic Tests: Chest x-ray reviewed with  patient-clear  Impression: Doing well but with persistent fluid retention and now persistent cellulitis. Somewhat improved after septra. He is allergic to penicillin. We'll give him a course of oral Levaquin and wrapped his right lower extremity with a Unna boot Plan: Continue Lasix with  3 doses of metolazone One week of oral Levaquin Keep legs elevated, unna boot  2 right leg Return in 5 days for wound check Mikey Bussing, MD [facility] [phone]

## 2015-06-13 ENCOUNTER — Telehealth: Payer: Self-pay | Admitting: Thoracic Surgery (Cardiothoracic Vascular Surgery)

## 2015-06-13 MED ORDER — CLINDAMYCIN HCL 300 MG PO CAPS: 300.0000 mg | ORAL_CAPSULE | Freq: Three times a day (TID) | ORAL | Status: DC

## 2015-06-13 NOTE — Telephone Encounter (Signed)
Jonathan Le is a patient of Dr. Donata Clay.  He was seen in the office yesterday re: cellulitis  He had been treated with Bactrim but the cellulitis had not completely resolved  He was given a prescription for levaquin, which caused dizziness and nausea, and he does not want to take it again  He is allergic to PCN and doxycycline and is on amiodarone the combination of which severely limits our therapeutic options.  I will prescribe clindamycin  Jonathan Le. Dorris Fetch, MD [facility] [phone]

## 2015-06-17 ENCOUNTER — Encounter: Payer: Self-pay | Admitting: Cardiothoracic Surgery

## 2015-06-17 ENCOUNTER — Ambulatory Visit (INDEPENDENT_AMBULATORY_CARE_PROVIDER_SITE_OTHER): Payer: Self-pay | Admitting: Cardiothoracic Surgery

## 2015-06-17 VITALS — BP 96/66 | HR 81 | Resp 16 | Ht 69.0 in | Wt 270.0 lb

## 2015-06-17 DIAGNOSIS — I2510 Atherosclerotic heart disease of native coronary artery without angina pectoris: Secondary | ICD-10-CM

## 2015-06-17 DIAGNOSIS — Z951 Presence of aortocoronary bypass graft: Secondary | ICD-10-CM

## 2015-06-17 DIAGNOSIS — L03115 Cellulitis of right lower limb: Secondary | ICD-10-CM

## 2015-06-17 NOTE — Progress Notes (Signed)
PCP is Elizabeth Palau, FNP Referring Provider is Othella Boyer, MD  Chief Complaint  Patient presents with  . Routine Post Op    to assess right leg cellulitis and treatment with unna boot/Clindamycin as preascribed by Dr. Dorris Fetch after reacion to Levaquin    BMW:[id number] returns for routine followup of right lower leg cellulitis after CABG and right leg Endo scopic harvest vein. The leg is much improved. The una boot wrap is removed. There is a eschar over the dorsum of the right foot which is treated with a Silvadene and dried gauze dressing. The erythema is almost resolved over the leg. He will finish his course of oral antibiotic-clindamycin due to his multiple Allergies The patient is ready to move onto bilateral compression stockings measured at the medical supply store.  The patient is ready to start outpatient cardiac rehabilitation and driving. The patient has other multiple complaints or concerns which were all addressed during the office visit including questions about appetite, sex, activity limits, sternal healing, neck pain, visual changes, hearing issues.   Past Medical History  Diagnosis Date  . Anxiety   . Hypertension   . Arthritis   . Sleep apnea     wears CPAP nightly  . Glaucoma     Past Surgical History  Procedure Laterality Date  . Hernia repair      right  . Eye surgery      bil cataract  . Wisdom tooth extraction    . Fasciotomy foot / toe Left     foot  . Cardiac catheterization N/A 05/20/2015    Procedure: Left Heart Cath and Coronary Angiography;  Surgeon: Kathleene Hazel, MD;  Location: [facility] INVASIVE CV LAB;  Service: Cardiovascular;  Laterality: N/A;  . Coronary artery bypass graft N/A 05/22/2015    Procedure: CORONARY ARTERY BYPASS GRAFTING (CABG), ON PUMP, TIMES THREE, USING LEFT INTERNAL MAMMARY ARTERY, RIGHT GREATER SAPHENOUS VEIN HARVESTED ENDOSCOPICALLY;  Surgeon: Kerin Perna, MD;  Location: [facility] OR;  Service: Open Heart Surgery;   Laterality: N/A;  LIMA-LAD, SVG-OM, SVG-PD  . Tee without cardioversion N/A 05/22/2015    Procedure: TRANSESOPHAGEAL ECHOCARDIOGRAM (TEE);  Surgeon: Kerin Perna, MD;  Location: [facility] OR;  Service: Open Heart Surgery;  Laterality: N/A;    No family history on file.  Social History History  Substance Use Topics  . Smoking status: Never Smoker   . Smokeless tobacco: Never Used  . Alcohol Use: No    Current Outpatient Prescriptions  Medication Sig Dispense Refill  . acetaminophen (TYLENOL) 500 MG tablet Take 1,500 mg by mouth every 6 (six) hours as needed for headache.    . ALPRAZolam (XANAX) 1 MG tablet Take 1 tablet (1 mg total) by mouth 2 (two) times daily. 10 tablet 0  . amiodarone (PACERONE) 200 MG tablet Take 1 tablet (200 mg total) by mouth daily. 30 tablet 1  . aspirin EC 325 MG EC tablet Take 1 tablet (325 mg total) by mouth daily. 30 tablet 0  . atorvastatin (LIPITOR) 80 MG tablet Take 1 tablet (80 mg total) by mouth daily at 6 PM. 30 tablet 3  . clindamycin (CLEOCIN) 300 MG capsule Take 1 capsule (300 mg total) by mouth 3 (three) times daily. 30 capsule 0  . finasteride (PROSCAR) 5 MG tablet Take 5 mg by mouth daily.    . furosemide (LASIX) 40 MG tablet TAKE 1 TABLET BY MOUTH DAILY 90 tablet 0  . lisinopril (PRINIVIL,ZESTRIL) 5 MG tablet Take 1 tablet (5 mg  total) by mouth daily. 30 tablet 3  . metolazone (ZAROXOLYN) 5 MG tablet TAKE 1 TABLET BY MOUTH EVERY DAY 90 tablet 0  . potassium chloride SA (K-DUR,KLOR-CON) 20 MEQ tablet TAKE 1 TABLET BY MOUTH DAILY 90 tablet 0   No current facility-administered medications for this visit.    Allergies  Allergen Reactions  . Penicillins Hives  . Doxycycline Rash  . Levaquin [Levofloxacin In D5w] Nausea Only and Other (See Comments)    Dizziness     Review of Systems  Continues to get stronger Losing weight now down to 275  BP 96/66 mmHg  Pulse 81  Resp 16  Ht 5\' 9"  (1.753 m)  Wt 270 lb (122.471 kg)  BMI 39.85 kg/m2   SpO2 94% Physical Exam Alert and comfortable Lungs clear Heart rate regular Right leg cellulitis resolved Minimal edema  Diagnostic Tests: none  Impression: continues to make progress He has appointment with his cardiologist Dr. Donnie Aho in near future.  Plan: Return for followup of progress approximately 3 weeks. Continue current medications.  Mikey Bussing, MD [facility] [phone]

## 2015-06-18 ENCOUNTER — Inpatient Hospital Stay (HOSPITAL_COMMUNITY)
Admission: EM | Admit: 2015-06-18 | Discharge: 2015-07-08 | DRG: 003 | Disposition: A | Discharge status: Other Acute Inpatient Hospital | Payer: Medicare Other | Attending: Pulmonary Disease | Admitting: Pulmonary Disease

## 2015-06-18 ENCOUNTER — Emergency Department (HOSPITAL_COMMUNITY): Payer: Medicare Other

## 2015-06-18 ENCOUNTER — Encounter (HOSPITAL_COMMUNITY): Payer: Self-pay | Admitting: Emergency Medicine

## 2015-06-18 ENCOUNTER — Inpatient Hospital Stay (HOSPITAL_COMMUNITY): Payer: Medicare Other

## 2015-06-18 ENCOUNTER — Inpatient Hospital Stay (HOSPITAL_COMMUNITY): Payer: Medicare Other | Admitting: Certified Registered"

## 2015-06-18 ENCOUNTER — Encounter (HOSPITAL_COMMUNITY)
Admission: EM | Disposition: A | Discharge status: Nursing Home (Includes ICF) | Payer: Self-pay | Source: Home / Self Care | Attending: Pulmonary Disease

## 2015-06-18 DIAGNOSIS — R29898 Other symptoms and signs involving the musculoskeletal system: Secondary | ICD-10-CM | POA: Diagnosis not present

## 2015-06-18 DIAGNOSIS — R739 Hyperglycemia, unspecified: Secondary | ICD-10-CM | POA: Diagnosis present

## 2015-06-18 DIAGNOSIS — Z01818 Encounter for other preprocedural examination: Secondary | ICD-10-CM

## 2015-06-18 DIAGNOSIS — R0602 Shortness of breath: Secondary | ICD-10-CM

## 2015-06-18 DIAGNOSIS — I2510 Atherosclerotic heart disease of native coronary artery without angina pectoris: Secondary | ICD-10-CM | POA: Diagnosis present

## 2015-06-18 DIAGNOSIS — E876 Hypokalemia: Secondary | ICD-10-CM | POA: Diagnosis present

## 2015-06-18 DIAGNOSIS — A419 Sepsis, unspecified organism: Secondary | ICD-10-CM | POA: Diagnosis present

## 2015-06-18 DIAGNOSIS — F419 Anxiety disorder, unspecified: Secondary | ICD-10-CM | POA: Diagnosis present

## 2015-06-18 DIAGNOSIS — Z880 Allergy status to penicillin: Secondary | ICD-10-CM

## 2015-06-18 DIAGNOSIS — IMO0001 Reserved for inherently not codable concepts without codable children: Secondary | ICD-10-CM | POA: Insufficient documentation

## 2015-06-18 DIAGNOSIS — G4733 Obstructive sleep apnea (adult) (pediatric): Secondary | ICD-10-CM | POA: Diagnosis present

## 2015-06-18 DIAGNOSIS — L97519 Non-pressure chronic ulcer of other part of right foot with unspecified severity: Secondary | ICD-10-CM | POA: Diagnosis present

## 2015-06-18 DIAGNOSIS — G9341 Metabolic encephalopathy: Secondary | ICD-10-CM | POA: Diagnosis present

## 2015-06-18 DIAGNOSIS — J9601 Acute respiratory failure with hypoxia: Secondary | ICD-10-CM | POA: Diagnosis not present

## 2015-06-18 DIAGNOSIS — R579 Shock, unspecified: Secondary | ICD-10-CM | POA: Diagnosis not present

## 2015-06-18 DIAGNOSIS — D649 Anemia, unspecified: Secondary | ICD-10-CM | POA: Diagnosis present

## 2015-06-18 DIAGNOSIS — Z7982 Long term (current) use of aspirin: Secondary | ICD-10-CM | POA: Diagnosis not present

## 2015-06-18 DIAGNOSIS — H409 Unspecified glaucoma: Secondary | ICD-10-CM | POA: Diagnosis present

## 2015-06-18 DIAGNOSIS — J690 Pneumonitis due to inhalation of food and vomit: Secondary | ICD-10-CM | POA: Diagnosis not present

## 2015-06-18 DIAGNOSIS — E872 Acidosis: Secondary | ICD-10-CM | POA: Diagnosis present

## 2015-06-18 DIAGNOSIS — Z4659 Encounter for fitting and adjustment of other gastrointestinal appliance and device: Secondary | ICD-10-CM

## 2015-06-18 DIAGNOSIS — I10 Essential (primary) hypertension: Secondary | ICD-10-CM | POA: Diagnosis present

## 2015-06-18 DIAGNOSIS — Z951 Presence of aortocoronary bypass graft: Secondary | ICD-10-CM | POA: Diagnosis not present

## 2015-06-18 DIAGNOSIS — J9690 Respiratory failure, unspecified, unspecified whether with hypoxia or hypercapnia: Secondary | ICD-10-CM

## 2015-06-18 DIAGNOSIS — I255 Ischemic cardiomyopathy: Secondary | ICD-10-CM | POA: Diagnosis present

## 2015-06-18 DIAGNOSIS — J9811 Atelectasis: Secondary | ICD-10-CM | POA: Diagnosis present

## 2015-06-18 DIAGNOSIS — R4182 Altered mental status, unspecified: Secondary | ICD-10-CM | POA: Diagnosis not present

## 2015-06-18 DIAGNOSIS — Z79899 Other long term (current) drug therapy: Secondary | ICD-10-CM | POA: Diagnosis not present

## 2015-06-18 DIAGNOSIS — R0600 Dyspnea, unspecified: Secondary | ICD-10-CM

## 2015-06-18 DIAGNOSIS — T85598A Other mechanical complication of other gastrointestinal prosthetic devices, implants and grafts, initial encounter: Secondary | ICD-10-CM

## 2015-06-18 DIAGNOSIS — R1084 Generalized abdominal pain: Secondary | ICD-10-CM

## 2015-06-18 DIAGNOSIS — L03115 Cellulitis of right lower limb: Secondary | ICD-10-CM | POA: Diagnosis present

## 2015-06-18 DIAGNOSIS — R55 Syncope and collapse: Secondary | ICD-10-CM

## 2015-06-18 DIAGNOSIS — K5669 Other intestinal obstruction: Secondary | ICD-10-CM | POA: Diagnosis not present

## 2015-06-18 DIAGNOSIS — Z881 Allergy status to other antibiotic agents status: Secondary | ICD-10-CM | POA: Diagnosis not present

## 2015-06-18 DIAGNOSIS — K659 Peritonitis, unspecified: Secondary | ICD-10-CM | POA: Diagnosis not present

## 2015-06-18 DIAGNOSIS — R0902 Hypoxemia: Secondary | ICD-10-CM

## 2015-06-18 DIAGNOSIS — Z9911 Dependence on respirator [ventilator] status: Secondary | ICD-10-CM

## 2015-06-18 DIAGNOSIS — Z9989 Dependence on other enabling machines and devices: Secondary | ICD-10-CM

## 2015-06-18 DIAGNOSIS — K559 Vascular disorder of intestine, unspecified: Secondary | ICD-10-CM | POA: Diagnosis present

## 2015-06-18 DIAGNOSIS — R410 Disorientation, unspecified: Secondary | ICD-10-CM | POA: Diagnosis not present

## 2015-06-18 DIAGNOSIS — Z9289 Personal history of other medical treatment: Secondary | ICD-10-CM

## 2015-06-18 DIAGNOSIS — N179 Acute kidney failure, unspecified: Secondary | ICD-10-CM | POA: Diagnosis present

## 2015-06-18 DIAGNOSIS — R6521 Severe sepsis with septic shock: Secondary | ICD-10-CM | POA: Diagnosis present

## 2015-06-18 DIAGNOSIS — D72829 Elevated white blood cell count, unspecified: Secondary | ICD-10-CM

## 2015-06-18 DIAGNOSIS — K668 Other specified disorders of peritoneum: Secondary | ICD-10-CM

## 2015-06-18 DIAGNOSIS — R509 Fever, unspecified: Secondary | ICD-10-CM | POA: Diagnosis not present

## 2015-06-18 DIAGNOSIS — Z930 Tracheostomy status: Secondary | ICD-10-CM | POA: Insufficient documentation

## 2015-06-18 DIAGNOSIS — R5381 Other malaise: Secondary | ICD-10-CM | POA: Diagnosis not present

## 2015-06-18 DIAGNOSIS — J9600 Acute respiratory failure, unspecified whether with hypoxia or hypercapnia: Secondary | ICD-10-CM

## 2015-06-18 DIAGNOSIS — L8990 Pressure ulcer of unspecified site, unspecified stage: Secondary | ICD-10-CM | POA: Diagnosis not present

## 2015-06-18 DIAGNOSIS — Z9049 Acquired absence of other specified parts of digestive tract: Secondary | ICD-10-CM

## 2015-06-18 HISTORY — PX: LAPAROTOMY: SHX154

## 2015-06-18 HISTORY — DX: Heart failure, unspecified: I50.9

## 2015-06-18 LAB — BASIC METABOLIC PANEL
ANION GAP: 20 — AB (ref 5–15)
ANION GAP: 23 — AB (ref 5–15)
Anion gap: 16 — ABNORMAL HIGH (ref 5–15)
BUN: 20 mg/dL (ref 6–20)
BUN: 23 mg/dL — AB (ref 6–20)
BUN: 22 mg/dL — AB (ref 6–20)
CALCIUM: 8.3 mg/dL — AB (ref 8.9–10.3)
CHLORIDE: 97 mmol/L — AB (ref 101–111)
CO2: 18 mmol/L — ABNORMAL LOW (ref 22–32)
CO2: 15 mmol/L — ABNORMAL LOW (ref 22–32)
CO2: 20 mmol/L — ABNORMAL LOW (ref 22–32)
CREATININE: 1.43 mg/dL — AB (ref 0.61–1.24)
Calcium: 8.9 mg/dL (ref 8.9–10.3)
Calcium: 7.9 mg/dL — ABNORMAL LOW (ref 8.9–10.3)
Chloride: 97 mmol/L — ABNORMAL LOW (ref 101–111)
Chloride: 99 mmol/L — ABNORMAL LOW (ref 101–111)
Creatinine, Ser: 1.66 mg/dL — ABNORMAL HIGH (ref 0.61–1.24)
Creatinine, Ser: 1.82 mg/dL — ABNORMAL HIGH (ref 0.61–1.24)
GFR calc Af Amer: 42 mL/min — ABNORMAL LOW (ref >60)
GFR calc non Af Amer: 40 mL/min — ABNORMAL LOW (ref >60)
GFR, EST AFRICAN AMERICAN: 47 mL/min — AB (ref >60)
GFR, EST AFRICAN AMERICAN: 56 mL/min — AB (ref >60)
GFR, EST NON AFRICAN AMERICAN: 36 mL/min — AB (ref >60)
GFR, EST NON AFRICAN AMERICAN: 48 mL/min — AB (ref >60)
Glucose, Bld: 145 mg/dL — ABNORMAL HIGH (ref 65–99)
Glucose, Bld: 140 mg/dL — ABNORMAL HIGH (ref 65–99)
Glucose, Bld: 177 mg/dL — ABNORMAL HIGH (ref 65–99)
POTASSIUM: 2.1 mmol/L — AB (ref 3.5–5.1)
POTASSIUM: 2.8 mmol/L — AB (ref 3.5–5.1)
Potassium: 2.5 mmol/L — CL (ref 3.5–5.1)
SODIUM: 135 mmol/L (ref 135–145)
SODIUM: 135 mmol/L (ref 135–145)
Sodium: 135 mmol/L (ref 135–145)

## 2015-06-18 LAB — MAGNESIUM
MAGNESIUM: 1.8 mg/dL (ref 1.7–2.4)
Magnesium: 1.7 mg/dL (ref 1.7–2.4)

## 2015-06-18 LAB — COMPREHENSIVE METABOLIC PANEL
ALBUMIN: 3.1 g/dL — AB (ref 3.5–5.0)
ALT: 19 U/L (ref 17–63)
ANION GAP: 30 — AB (ref 5–15)
AST: 47 U/L — ABNORMAL HIGH (ref 15–41)
Alkaline Phosphatase: 112 U/L (ref 38–126)
BILIRUBIN TOTAL: 1.1 mg/dL (ref 0.3–1.2)
BUN: 15 mg/dL (ref 6–20)
CALCIUM: 9.5 mg/dL (ref 8.9–10.3)
CO2: 13 mmol/L — ABNORMAL LOW (ref 22–32)
Chloride: 89 mmol/L — ABNORMAL LOW (ref 101–111)
Creatinine, Ser: 1.21 mg/dL (ref 0.61–1.24)
GFR calc Af Amer: >60 mL/min (ref >60)
GFR, EST NON AFRICAN AMERICAN: 59 mL/min — AB (ref >60)
Glucose, Bld: 251 mg/dL — ABNORMAL HIGH (ref 65–99)
Potassium: <2 mmol/L — CL (ref 3.5–5.1)
Sodium: 132 mmol/L — ABNORMAL LOW (ref 135–145)
Total Protein: 6.3 g/dL — ABNORMAL LOW (ref 6.5–8.1)

## 2015-06-18 LAB — GLUCOSE, CAPILLARY
GLUCOSE-CAPILLARY: 112 mg/dL — AB (ref 65–99)
GLUCOSE-CAPILLARY: 151 mg/dL — AB (ref 65–99)
GLUCOSE-CAPILLARY: 160 mg/dL — AB (ref 65–99)
Glucose-Capillary: 143 mg/dL — ABNORMAL HIGH (ref 65–99)

## 2015-06-18 LAB — CBC WITH DIFFERENTIAL/PLATELET
BASOS ABS: 0.1 10*3/uL (ref 0.0–0.1)
BASOS PCT: 1 % (ref 0–1)
Basophils Absolute: 0 10*3/uL (ref 0.0–0.1)
Basophils Relative: 0 % (ref 0–1)
EOS ABS: 0.2 10*3/uL (ref 0.0–0.7)
Eosinophils Absolute: 0 10*3/uL (ref 0.0–0.7)
Eosinophils Relative: 3 % (ref 0–5)
Eosinophils Relative: 0 % (ref 0–5)
HEMATOCRIT: 46.5 % (ref 39.0–52.0)
HEMATOCRIT: 40.2 % (ref 39.0–52.0)
HEMOGLOBIN: 14 g/dL (ref 13.0–17.0)
Hemoglobin: 15.7 g/dL (ref 13.0–17.0)
LYMPHS ABS: 0.9 10*3/uL (ref 0.7–4.0)
LYMPHS PCT: 10 % — AB (ref 12–46)
Lymphocytes Relative: 33 % (ref 12–46)
Lymphs Abs: 2.5 10*3/uL (ref 0.7–4.0)
MCH: 29 pg (ref 26.0–34.0)
MCH: 29 pg (ref 26.0–34.0)
MCHC: 33.8 g/dL (ref 30.0–36.0)
MCHC: 34.8 g/dL (ref 30.0–36.0)
MCV: 86 fL (ref 78.0–100.0)
MCV: 83.2 fL (ref 78.0–100.0)
MONO ABS: 0.5 10*3/uL (ref 0.1–1.0)
Monocytes Absolute: 0.6 10*3/uL (ref 0.1–1.0)
Monocytes Relative: 7 % (ref 3–12)
Monocytes Relative: 7 % (ref 3–12)
NEUTROS PCT: 83 % — AB (ref 43–77)
Neutro Abs: 4.4 10*3/uL (ref 1.7–7.7)
Neutro Abs: 7.6 10*3/uL (ref 1.7–7.7)
Neutrophils Relative %: 56 % (ref 43–77)
PLATELETS: 386 10*3/uL (ref 150–400)
Platelets: 252 10*3/uL (ref 150–400)
RBC: 5.41 MIL/uL (ref 4.22–5.81)
RBC: 4.83 MIL/uL (ref 4.22–5.81)
RDW: 14.1 % (ref 11.5–15.5)
RDW: 14.3 % (ref 11.5–15.5)
WBC: 7.7 10*3/uL (ref 4.0–10.5)
WBC: 9.1 10*3/uL (ref 4.0–10.5)

## 2015-06-18 LAB — POCT I-STAT 7, (LYTES, BLD GAS, ICA,H+H)
ACID-BASE DEFICIT: 12 mmol/L — AB (ref 0.0–2.0)
ACID-BASE DEFICIT: 8 mmol/L — AB (ref 0.0–2.0)
BICARBONATE: 20.1 meq/L (ref 20.0–24.0)
Bicarbonate: 18.3 mEq/L — ABNORMAL LOW (ref 20.0–24.0)
CALCIUM ION: 1.08 mmol/L — AB (ref 1.13–1.30)
Calcium, Ion: 1.16 mmol/L (ref 1.13–1.30)
HCT: 38 % — ABNORMAL LOW (ref 39.0–52.0)
HCT: 39 % (ref 39.0–52.0)
HEMOGLOBIN: 12.9 g/dL — AB (ref 13.0–17.0)
HEMOGLOBIN: 13.3 g/dL (ref 13.0–17.0)
O2 Saturation: 90 %
O2 Saturation: 93 %
PO2 ART: 80 mmHg (ref 80.0–100.0)
POTASSIUM: 2.9 mmol/L — AB (ref 3.5–5.1)
Potassium: 2.9 mmol/L — ABNORMAL LOW (ref 3.5–5.1)
Sodium: 135 mmol/L (ref 135–145)
Sodium: 137 mmol/L (ref 135–145)
TCO2: 20 mmol/L (ref 0–100)
TCO2: 22 mmol/L (ref 0–100)
pCO2 arterial: 50.2 mmHg — ABNORMAL HIGH (ref 35.0–45.0)
pCO2 arterial: 60.8 mmHg (ref 35.0–45.0)
pH, Arterial: 7.088 — CL (ref 7.350–7.450)
pH, Arterial: 7.21 — ABNORMAL LOW (ref 7.350–7.450)
pO2, Arterial: 81 mmHg (ref 80.0–100.0)

## 2015-06-18 LAB — PROCALCITONIN
PROCALCITONIN: 25.47 ng/mL
Procalcitonin: 5.42 ng/mL

## 2015-06-18 LAB — CBC
HEMATOCRIT: 45.4 % (ref 39.0–52.0)
HEMOGLOBIN: 15.7 g/dL (ref 13.0–17.0)
MCH: 29.6 pg (ref 26.0–34.0)
MCHC: 34.6 g/dL (ref 30.0–36.0)
MCV: 85.5 fL (ref 78.0–100.0)
PLATELETS: 311 10*3/uL (ref 150–400)
RBC: 5.31 MIL/uL (ref 4.22–5.81)
RDW: 14.2 % (ref 11.5–15.5)
WBC: 18.9 10*3/uL — ABNORMAL HIGH (ref 4.0–10.5)

## 2015-06-18 LAB — PROTIME-INR
INR: 1.78 — ABNORMAL HIGH (ref 0.00–1.49)
INR: 1.72 — AB (ref 0.00–1.49)
INR: 1.39 (ref 0.00–1.49)
Prothrombin Time: 20.6 seconds — ABNORMAL HIGH (ref 11.6–15.2)
Prothrombin Time: 20.2 seconds — ABNORMAL HIGH (ref 11.6–15.2)
Prothrombin Time: 17.1 seconds — ABNORMAL HIGH (ref 11.6–15.2)

## 2015-06-18 LAB — C DIFFICILE QUICK SCREEN W PCR REFLEX
C DIFFICILE (CDIFF) INTERP: NEGATIVE
C Diff antigen: NEGATIVE
C Diff toxin: NEGATIVE

## 2015-06-18 LAB — PHOSPHORUS: Phosphorus: 5.7 mg/dL — ABNORMAL HIGH (ref 2.5–4.6)

## 2015-06-18 LAB — I-STAT TROPONIN, ED: Troponin i, poc: 0.07 ng/mL (ref 0.00–0.08)

## 2015-06-18 LAB — LACTIC ACID, PLASMA
LACTIC ACID, VENOUS: 15 mmol/L — AB (ref 0.5–2.0)
LACTIC ACID, VENOUS: 8.3 mmol/L — AB (ref 0.5–2.0)
Lactic Acid, Venous: 8.4 mmol/L (ref 0.5–2.0)
Lactic Acid, Venous: 8.7 mmol/L (ref 0.5–2.0)

## 2015-06-18 LAB — BETA-HYDROXYBUTYRIC ACID: Beta-Hydroxybutyric Acid: 0.27 mmol/L (ref 0.05–0.27)

## 2015-06-18 LAB — CORTISOL: CORTISOL PLASMA: 69.5 ug/dL

## 2015-06-18 LAB — BRAIN NATRIURETIC PEPTIDE: B Natriuretic Peptide: 105.1 pg/mL — ABNORMAL HIGH (ref 0.0–100.0)

## 2015-06-18 LAB — TROPONIN I: TROPONIN I: 0.04 ng/mL — AB (ref <0.031)

## 2015-06-18 SURGERY — LAPAROTOMY, EXPLORATORY
Anesthesia: General | Site: Abdomen

## 2015-06-18 MED ORDER — MIDAZOLAM HCL 2 MG/2ML IJ SOLN
INTRAMUSCULAR | Status: AC
  Filled 2015-06-18: qty 4

## 2015-06-18 MED ORDER — SODIUM BICARBONATE 8.4 % IV SOLN
INTRAVENOUS | Status: DC | PRN
  Administered 2015-06-18 (×2): 50 meq via INTRAVENOUS

## 2015-06-18 MED ORDER — SODIUM CHLORIDE 0.9 % IV SOLN
INTRAVENOUS | Status: DC
  Administered 2015-06-18 – 2015-06-20 (×2): via INTRAVENOUS

## 2015-06-18 MED ORDER — PROPOFOL 10 MG/ML IV BOLUS
INTRAVENOUS | Status: DC | PRN
  Administered 2015-06-18: 20 mg via INTRAVENOUS

## 2015-06-18 MED ORDER — CALCIUM CHLORIDE 10 % IV SOLN
INTRAVENOUS | Status: DC | PRN
  Administered 2015-06-18: 250 mg via INTRAVENOUS

## 2015-06-18 MED ORDER — NOREPINEPHRINE BITARTRATE 1 MG/ML IV SOLN
4000.0000 ug | INTRAVENOUS | Status: DC | PRN
  Administered 2015-06-18: 40 ug/min via INTRAVENOUS

## 2015-06-18 MED ORDER — PERFLUTREN LIPID MICROSPHERE
1.0000 mL | INTRAVENOUS | Status: AC | PRN
  Administered 2015-06-18: 3 mL via INTRAVENOUS
  Filled 2015-06-18: qty 10

## 2015-06-18 MED ORDER — SODIUM CHLORIDE 0.9 % IV BOLUS (SEPSIS)
250.0000 mL | Freq: Once | INTRAVENOUS | Status: AC
  Administered 2015-06-18: 250 mL via INTRAVENOUS

## 2015-06-18 MED ORDER — VASOPRESSIN 20 UNIT/ML IV SOLN
0.0300 [IU]/min | INTRAVENOUS | Status: DC
  Administered 2015-06-19 – 2015-06-22 (×5): 0.03 [IU]/min via INTRAVENOUS
  Filled 2015-06-18 (×7): qty 2

## 2015-06-18 MED ORDER — FENTANYL CITRATE (PF) 250 MCG/5ML IJ SOLN
INTRAMUSCULAR | Status: AC
  Filled 2015-06-18: qty 5

## 2015-06-18 MED ORDER — SODIUM CHLORIDE 0.9 % IV SOLN
INTRAVENOUS | Status: DC
  Administered 2015-06-18: 1000 mL via INTRAVENOUS
  Administered 2015-06-18: 20:00:00 via INTRAVENOUS
  Administered 2015-06-19: 1000 mL via INTRAVENOUS

## 2015-06-18 MED ORDER — FENTANYL CITRATE (PF) 100 MCG/2ML IJ SOLN
50.0000 ug | INTRAMUSCULAR | Status: DC | PRN
  Administered 2015-06-20 – 2015-06-23 (×4): 50 ug via INTRAVENOUS
  Filled 2015-06-18 (×2): qty 2

## 2015-06-18 MED ORDER — INSULIN ASPART 100 UNIT/ML ~~LOC~~ SOLN
2.0000 [IU] | SUBCUTANEOUS | Status: DC
  Administered 2015-06-18: 2 [IU] via SUBCUTANEOUS
  Administered 2015-06-18 (×2): 4 [IU] via SUBCUTANEOUS
  Administered 2015-06-19 (×2): 2 [IU] via SUBCUTANEOUS
  Administered 2015-06-19: 4 [IU] via SUBCUTANEOUS
  Administered 2015-06-19: 2 [IU] via SUBCUTANEOUS
  Administered 2015-06-20: 4 [IU] via SUBCUTANEOUS
  Administered 2015-06-20 – 2015-06-23 (×15): 2 [IU] via SUBCUTANEOUS
  Administered 2015-06-24: 4 [IU] via SUBCUTANEOUS
  Administered 2015-06-24: 2 [IU] via SUBCUTANEOUS
  Administered 2015-06-24 (×2): 4 [IU] via SUBCUTANEOUS
  Administered 2015-06-24: 2 [IU] via SUBCUTANEOUS
  Administered 2015-06-24: 4 [IU] via SUBCUTANEOUS
  Administered 2015-06-24 – 2015-06-26 (×11): 2 [IU] via SUBCUTANEOUS
  Administered 2015-06-27: 4 [IU] via SUBCUTANEOUS
  Administered 2015-06-28 – 2015-07-08 (×13): 2 [IU] via SUBCUTANEOUS

## 2015-06-18 MED ORDER — ARTIFICIAL TEARS OP OINT
TOPICAL_OINTMENT | OPHTHALMIC | Status: DC | PRN
  Administered 2015-06-18: 1 via OPHTHALMIC

## 2015-06-18 MED ORDER — POTASSIUM CHLORIDE 10 MEQ/100ML IV SOLN
10.0000 meq | INTRAVENOUS | Status: AC
  Administered 2015-06-18 (×4): 10 meq via INTRAVENOUS
  Filled 2015-06-18 (×4): qty 100

## 2015-06-18 MED ORDER — NOREPINEPHRINE BITARTRATE 1 MG/ML IV SOLN
2.0000 ug/min | INTRAVENOUS | Status: DC
  Administered 2015-06-18: 34 ug/min via INTRAVENOUS
  Administered 2015-06-19: 40 ug/min via INTRAVENOUS
  Administered 2015-06-19: 20 ug/min via INTRAVENOUS
  Administered 2015-06-20 – 2015-06-21 (×2): 14 ug/min via INTRAVENOUS
  Administered 2015-06-22: 6 ug/min via INTRAVENOUS
  Administered 2015-06-24: 8.4 ug/min via INTRAVENOUS
  Administered 2015-06-26: 2 ug/min via INTRAVENOUS
  Filled 2015-06-18 (×11): qty 16

## 2015-06-18 MED ORDER — FENTANYL CITRATE (PF) 100 MCG/2ML IJ SOLN
INTRAMUSCULAR | Status: DC | PRN
  Administered 2015-06-18: 50 ug via INTRAVENOUS
  Administered 2015-06-18: 250 ug via INTRAVENOUS

## 2015-06-18 MED ORDER — MIDAZOLAM HCL 2 MG/2ML IJ SOLN
1.0000 mg | INTRAMUSCULAR | Status: AC | PRN
  Administered 2015-06-18 (×3): 1 mg via INTRAVENOUS
  Filled 2015-06-18: qty 2

## 2015-06-18 MED ORDER — 0.9 % SODIUM CHLORIDE (POUR BTL) OPTIME
TOPICAL | Status: DC | PRN
  Administered 2015-06-18: 1000 mL
  Administered 2015-06-18: 4000 mL

## 2015-06-18 MED ORDER — EPINEPHRINE HCL 1 MG/ML IJ SOLN
4000.0000 ug | INTRAMUSCULAR | Status: DC | PRN
  Administered 2015-06-18: 5 ug/min via INTRAVENOUS

## 2015-06-18 MED ORDER — SODIUM CHLORIDE 0.9 % IV SOLN
25.0000 ug/h | INTRAVENOUS | Status: DC
  Administered 2015-06-18: 50 ug/h via INTRAVENOUS
  Administered 2015-06-19: 150 ug/h via INTRAVENOUS
  Administered 2015-06-20: 300 ug/h via INTRAVENOUS
  Administered 2015-06-20: 200 ug/h via INTRAVENOUS
  Administered 2015-06-21: 300 ug/h via INTRAVENOUS
  Administered 2015-06-21: 350 ug/h via INTRAVENOUS
  Administered 2015-06-21: 200 ug/h via INTRAVENOUS
  Administered 2015-06-22: 300 ug/h via INTRAVENOUS
  Filled 2015-06-18 (×9): qty 50

## 2015-06-18 MED ORDER — SODIUM CHLORIDE 0.9 % IV SOLN
2500.0000 mg | Freq: Once | INTRAVENOUS | Status: AC
  Administered 2015-06-18: 2500 mg via INTRAVENOUS
  Filled 2015-06-18: qty 2500

## 2015-06-18 MED ORDER — POTASSIUM CHLORIDE 10 MEQ/50ML IV SOLN
10.0000 meq | INTRAVENOUS | Status: AC
  Administered 2015-06-18 (×4): 10 meq via INTRAVENOUS
  Filled 2015-06-18: qty 50

## 2015-06-18 MED ORDER — SODIUM CHLORIDE 0.9 % IV SOLN
250.0000 mL | INTRAVENOUS | Status: DC | PRN
  Administered 2015-06-20 (×2): via INTRAVENOUS

## 2015-06-18 MED ORDER — FENTANYL CITRATE (PF) 100 MCG/2ML IJ SOLN
50.0000 ug | INTRAMUSCULAR | Status: AC | PRN
  Administered 2015-06-18 (×3): 50 ug via INTRAVENOUS
  Filled 2015-06-18 (×2): qty 2

## 2015-06-18 MED ORDER — PHENYLEPHRINE HCL 10 MG/ML IJ SOLN
0.0000 ug/min | INTRAVENOUS | Status: DC
  Administered 2015-06-18: 110 ug/min via INTRAVENOUS
  Administered 2015-06-18: 20 ug/min via INTRAVENOUS
  Filled 2015-06-18 (×5): qty 1

## 2015-06-18 MED ORDER — PANTOPRAZOLE SODIUM 40 MG IV SOLR
40.0000 mg | Freq: Every day | INTRAVENOUS | Status: DC
  Administered 2015-06-18 – 2015-06-29 (×12): 40 mg via INTRAVENOUS
  Filled 2015-06-18 (×14): qty 40

## 2015-06-18 MED ORDER — ROCURONIUM BROMIDE 100 MG/10ML IV SOLN
INTRAVENOUS | Status: DC | PRN
  Administered 2015-06-18: 10 mg via INTRAVENOUS
  Administered 2015-06-18: 50 mg via INTRAVENOUS

## 2015-06-18 MED ORDER — SODIUM CHLORIDE 0.9 % IV BOLUS (SEPSIS)
1000.0000 mL | Freq: Once | INTRAVENOUS | Status: AC
  Administered 2015-06-18: 1000 mL via INTRAVENOUS

## 2015-06-18 MED ORDER — HEPARIN SODIUM (PORCINE) 5000 UNIT/ML IJ SOLN
5000.0000 [IU] | Freq: Three times a day (TID) | INTRAMUSCULAR | Status: DC
  Administered 2015-06-18 – 2015-06-25 (×21): 5000 [IU] via SUBCUTANEOUS
  Filled 2015-06-18 (×24): qty 1

## 2015-06-18 MED ORDER — VANCOMYCIN HCL 10 G IV SOLR
1750.0000 mg | INTRAVENOUS | Status: DC
  Administered 2015-06-19: 1750 mg via INTRAVENOUS
  Filled 2015-06-18: qty 1750

## 2015-06-18 MED ORDER — POTASSIUM CHLORIDE 10 MEQ/50ML IV SOLN
10.0000 meq | INTRAVENOUS | Status: AC
  Administered 2015-06-18 – 2015-06-19 (×6): 10 meq via INTRAVENOUS
  Filled 2015-06-18 (×6): qty 50

## 2015-06-18 MED ORDER — POTASSIUM CHLORIDE 10 MEQ/50ML IV SOLN
INTRAVENOUS | Status: DC | PRN
  Administered 2015-06-18 (×2): 10 meq via INTRAVENOUS

## 2015-06-18 MED ORDER — MAGNESIUM SULFATE IN D5W 10-5 MG/ML-% IV SOLN
1.0000 g | Freq: Once | INTRAVENOUS | Status: AC
  Administered 2015-06-18: 1 g via INTRAVENOUS
  Filled 2015-06-18: qty 100

## 2015-06-18 MED ORDER — DEXTROSE 5 % IV SOLN
2.0000 ug/min | INTRAVENOUS | Status: DC
  Administered 2015-06-18: 5 ug/min via INTRAVENOUS
  Administered 2015-06-18: 30 ug/min via INTRAVENOUS
  Filled 2015-06-18 (×3): qty 4

## 2015-06-18 MED ORDER — EPINEPHRINE HCL 0.1 MG/ML IJ SOSY
PREFILLED_SYRINGE | INTRAMUSCULAR | Status: DC | PRN
  Administered 2015-06-18 (×3): 20 ug via INTRAVENOUS
  Administered 2015-06-18: 10 ug via INTRAVENOUS

## 2015-06-18 MED ORDER — IOHEXOL 350 MG/ML SOLN
100.0000 mL | Freq: Once | INTRAVENOUS | Status: AC | PRN
  Administered 2015-06-18: 100 mL via INTRAVENOUS

## 2015-06-18 MED ORDER — METRONIDAZOLE IN NACL 5-0.79 MG/ML-% IV SOLN
500.0000 mg | Freq: Three times a day (TID) | INTRAVENOUS | Status: DC
  Administered 2015-06-18 – 2015-06-25 (×20): 500 mg via INTRAVENOUS
  Filled 2015-06-18 (×22): qty 100

## 2015-06-18 MED ORDER — FLUCONAZOLE IN SODIUM CHLORIDE 400-0.9 MG/200ML-% IV SOLN
400.0000 mg | INTRAVENOUS | Status: DC
Start: 2015-06-18 — End: 2015-06-25
  Administered 2015-06-18 – 2015-06-24 (×7): 400 mg via INTRAVENOUS
  Filled 2015-06-18 (×8): qty 200

## 2015-06-18 MED ORDER — LACTATED RINGERS IV BOLUS (SEPSIS)
1000.0000 mL | Freq: Once | INTRAVENOUS | Status: AC
  Administered 2015-06-18: 1000 mL via INTRAVENOUS
  Filled 2015-06-18: qty 1000

## 2015-06-18 MED ORDER — DEXTROSE 5 % IV SOLN
2.0000 g | Freq: Two times a day (BID) | INTRAVENOUS | Status: DC
  Administered 2015-06-18 – 2015-06-20 (×5): 2 g via INTRAVENOUS
  Filled 2015-06-18 (×6): qty 2

## 2015-06-18 MED ORDER — VASOPRESSIN 20 UNIT/ML IV SOLN
INTRAVENOUS | Status: DC | PRN
  Administered 2015-06-18 (×5): 2 [IU] via INTRAVENOUS

## 2015-06-18 MED ORDER — MIDAZOLAM HCL 2 MG/2ML IJ SOLN
1.0000 mg | INTRAMUSCULAR | Status: DC | PRN
  Administered 2015-06-18 – 2015-06-22 (×8): 1 mg via INTRAVENOUS
  Filled 2015-06-18 (×10): qty 2

## 2015-06-18 MED ORDER — MIDAZOLAM HCL 5 MG/5ML IJ SOLN
INTRAMUSCULAR | Status: DC | PRN
  Administered 2015-06-18 (×3): 2 mg via INTRAVENOUS

## 2015-06-18 MED ORDER — SODIUM CHLORIDE 0.9 % IV SOLN
INTRAVENOUS | Status: DC | PRN
  Administered 2015-06-18: 17:00:00 via INTRAVENOUS

## 2015-06-18 MED FILL — Medication: Qty: 1 | Status: AC

## 2015-06-18 SURGICAL SUPPLY — 52 items
BLADE SURG ROTATE 9660 (MISCELLANEOUS) ×2 IMPLANT
CANISTER SUCTION 2500CC (MISCELLANEOUS) ×3 IMPLANT
CHLORAPREP W/TINT 26ML (MISCELLANEOUS) ×1 IMPLANT
COVER MAYO STAND STRL (DRAPES) IMPLANT
COVER SURGICAL LIGHT HANDLE (MISCELLANEOUS) ×5 IMPLANT
DRAPE LAPAROSCOPIC ABDOMINAL (DRAPES) ×3 IMPLANT
DRAPE PROXIMA HALF (DRAPES) IMPLANT
DRAPE UTILITY XL STRL (DRAPES) ×6 IMPLANT
DRAPE WARM FLUID 44X44 (DRAPE) ×3 IMPLANT
DRSG OPSITE POSTOP 4X10 (GAUZE/BANDAGES/DRESSINGS) IMPLANT
DRSG OPSITE POSTOP 4X8 (GAUZE/BANDAGES/DRESSINGS) IMPLANT
DRSG VAC ATS LRG SENSATRAC (GAUZE/BANDAGES/DRESSINGS) ×2 IMPLANT
ELECT BLADE 6.5 EXT (BLADE) ×2 IMPLANT
ELECT CAUTERY BLADE 6.4 (BLADE) ×4 IMPLANT
ELECT REM PT RETURN 9FT ADLT (ELECTROSURGICAL) ×3
ELECTRODE REM PT RTRN 9FT ADLT (ELECTROSURGICAL) ×1 IMPLANT
GLOVE BIO SURGEON STRL SZ7 (GLOVE) ×3 IMPLANT
GLOVE BIOGEL PI IND STRL 7.5 (GLOVE) ×1 IMPLANT
GLOVE BIOGEL PI INDICATOR 7.5 (GLOVE) ×2
GOWN STRL REUS W/ TWL LRG LVL3 (GOWN DISPOSABLE) ×3 IMPLANT
GOWN STRL REUS W/TWL LRG LVL3 (GOWN DISPOSABLE) ×9
KIT BASIN OR (CUSTOM PROCEDURE TRAY) ×3 IMPLANT
KIT ROOM TURNOVER OR (KITS) ×3 IMPLANT
LIGASURE IMPACT 36 18CM CVD LR (INSTRUMENTS) ×2 IMPLANT
NS IRRIG 1000ML POUR BTL (IV SOLUTION) ×6 IMPLANT
PACK GENERAL/GYN (CUSTOM PROCEDURE TRAY) ×3 IMPLANT
PAD ARMBOARD 7.5X6 YLW CONV (MISCELLANEOUS) ×3 IMPLANT
PENCIL BUTTON HOLSTER BLD 10FT (ELECTRODE) IMPLANT
RELOAD PROXIMATE 75MM BLUE (ENDOMECHANICALS) ×3 IMPLANT
RELOAD STAPLE 75 3.8 BLU REG (ENDOMECHANICALS) IMPLANT
SPECIMEN JAR LARGE (MISCELLANEOUS) IMPLANT
SPONGE ABDOMINAL VAC ABTHERA (MISCELLANEOUS) ×2 IMPLANT
SPONGE LAP 18X18 X RAY DECT (DISPOSABLE) ×4 IMPLANT
STAPLER CUT CVD 40MM GREEN (STAPLE) ×2 IMPLANT
STAPLER PROXIMATE 75MM BLUE (STAPLE) ×2 IMPLANT
STAPLER VISISTAT 35W (STAPLE) ×3 IMPLANT
SUCTION POOLE TIP (SUCTIONS) ×3 IMPLANT
SUT PDS AB 1 TP1 96 (SUTURE) ×6 IMPLANT
SUT SILK 2 0 (SUTURE) ×3
SUT SILK 2 0 SH CR/8 (SUTURE) ×3 IMPLANT
SUT SILK 2 0 TIES 10X30 (SUTURE) ×2 IMPLANT
SUT SILK 2-0 18XBRD TIE 12 (SUTURE) ×1 IMPLANT
SUT SILK 3 0 (SUTURE) ×3
SUT SILK 3 0 SH CR/8 (SUTURE) ×3 IMPLANT
SUT SILK 3-0 18XBRD TIE 12 (SUTURE) ×1 IMPLANT
SUT VIC AB 3-0 SH 27 (SUTURE)
SUT VIC AB 3-0 SH 27X BRD (SUTURE) IMPLANT
TOWEL OR 17X26 10 PK STRL BLUE (TOWEL DISPOSABLE) ×3 IMPLANT
TRAY FOLEY CATH 16FRSI W/METER (SET/KITS/TRAYS/PACK) IMPLANT
TUBE CONNECTING 12'X1/4 (SUCTIONS)
TUBE CONNECTING 12X1/4 (SUCTIONS) IMPLANT
YANKAUER SUCT BULB TIP NO VENT (SUCTIONS) ×2 IMPLANT

## 2015-06-18 NOTE — H&P (Signed)
PULMONARY / CRITICAL CARE MEDICINE   Name: Jonathan Le MRN: [id number] DOB: 10/06/1946    ADMISSION DATE:  06/18/2015 CONSULTATION DATE:  06/18/2015  REFERRING MD :  EDP Dr Blinda Leatherwood  CHIEF COMPLAINT:  Syncope  INITIAL PRESENTATION: 69 year old male s/p CABG 7/8 presented to ED 8/4 after losing consciousness on toilet. In ED he was hypoxic requiring NRB and hypotensive. PCCM called for admission.   STUDIES:  8/4 CT head >>> 8/4 CT abdomen >>> 8/4 CT chest >>>  SIGNIFICANT EVENTS: 7/8 CABG 8/4 Admitted for shock   HISTORY OF PRESENT ILLNESS:  69 year old male with PMH as below, which is significant for OSA and HTN. 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 He progressed and was discharged. Post discharge course complicated by R leg cellulitis after endoscopic vein harvest he was seen by Dr. Donata Clay in clinic for this 8/3 and was felt to be improving. No complaints noted at that time. Was in his USOH, but was having some constipation. 8/4 PM he took an enema and was straining in bathroom. He became diaphoretic and had loss of consciousness. Wife called EMS. In ED he was noted to be profoundly hypotensive with SOB. Tolerating NRB well. Was given 500cc bolus with some improvement. PCCM called for admission.   PAST MEDICAL HISTORY :   has a past medical history of Anxiety; Hypertension; Arthritis; Sleep apnea; and Glaucoma.  has past surgical history that includes Hernia repair; Eye surgery; Wisdom tooth extraction; Fasciotomy foot / toe (Left); Cardiac catheterization (N/A, 05/20/2015); Coronary artery bypass graft (N/A, 05/22/2015); and TEE without cardioversion (N/A, 05/22/2015). Prior to Admission medications   Medication Sig Start Date End Date Taking?  Authorizing Provider  acetaminophen (TYLENOL) 500 MG tablet Take 1,500 mg by mouth every 6 (six) hours as needed for headache.    Historical Provider, MD  ALPRAZolam Prudy Feeler) 1 MG tablet Take 1 tablet (1 mg total) by mouth 2 (two) times daily. 05/31/15   Erin R Barrett, PA-C  amiodarone (PACERONE) 200 MG tablet Take 1 tablet (200 mg total) by mouth daily. 05/31/15   Erin R Barrett, PA-C  aspirin EC 325 MG EC tablet Take 1 tablet (325 mg total) by mouth daily. 05/31/15   Erin R Barrett, PA-C  atorvastatin (LIPITOR) 80 MG tablet Take 1 tablet (80 mg total) by mouth daily at 6 PM. 05/31/15   Erin R Barrett, PA-C  clindamycin (CLEOCIN) 300 MG capsule Take 1 capsule (300 mg total) by mouth 3 (three) times daily. 06/13/15   Loreli Slot, MD  finasteride (PROSCAR) 5 MG tablet Take 5 mg by mouth daily.    Historical Provider, MD  furosemide (LASIX) 40 MG tablet TAKE 1 TABLET BY MOUTH DAILY 06/15/15   Alleen Borne, MD  lisinopril (PRINIVIL,ZESTRIL) 5 MG tablet Take 1 tablet (5 mg total) by mouth daily. 05/31/15   Erin R Barrett, PA-C  metolazone (ZAROXOLYN) 5 MG tablet TAKE 1 TABLET BY MOUTH EVERY DAY 06/15/15   Alleen Borne, MD  potassium chloride SA (K-DUR,KLOR-CON) 20 MEQ tablet TAKE 1 TABLET BY MOUTH DAILY 06/15/15   Alleen Borne, MD   Allergies  Allergen Reactions  . Penicillins Hives  . Doxycycline Rash  . Levaquin [Levofloxacin In  D5w] Nausea Only and Other (See Comments)    Dizziness     FAMILY HISTORY:  indicated that his mother is deceased. He indicated that his father is deceased.  SOCIAL HISTORY:  reports that he has never smoked. He has never used smokeless tobacco. He reports that he does not drink alcohol or use illicit drugs.  REVIEW OF SYSTEMS:   Bolds are positive  Constitutional: weight loss, gain, night sweats, Fevers, chills, fatigue .  HEENT: headaches, Sore throat, sneezing, nasal congestion, post nasal drip, Difficulty swallowing, Tooth/dental problems, visual complaints  visual changes, ear ache CV:  chest pain, radiates: ,Orthopnea, PND, swelling in lower extremities, dizziness, palpitations, syncope.  GI  heartburn, indigestion, abdominal pain, nausea, vomiting, diarrhea, change in bowel habits, loss of appetite, bloody stools.  Resp: cough, productive: , hemoptysis, dyspnea, chest pain, pleuritic.  Skin: rash or itching or icterus GU: dysuria, change in color of urine, urgency or frequency. flank pain, hematuria  MS: joint pain or swelling. decreased range of motion  Psych: change in mood or affect. depression or anxiety.  Neuro: difficulty with speech, weakness, numbness, ataxia    SUBJECTIVE:   VITAL SIGNS: Pulse Rate:  [81-97] 87 (08/04 0400) Resp:  [16-44] 44 (08/04 0345) BP: (50-150)/(32-81) 51/36 mmHg (08/04 0400) SpO2:  [94 %-100 %] 100 % (08/04 0400) Weight:  [122.471 kg (270 lb)] 122.471 kg (270 lb) (08/03 0930) HEMODYNAMICS:   VENTILATOR SETTINGS:   INTAKE / OUTPUT: No intake or output data in the 24 hours ending 06/18/15 0423  PHYSICAL EXAMINATION: General:  Obese male in respiratory distress on NRB Neuro:  Spontaneously awake, alert, confused, agitated.  HEENT:  [place]/AT, JVD difficult to discern with neck girth, PERRL Cardiovascular:  RRR, no MRG noted Lungs:  Clear bilateral breath sounds Abdomen:  Soft, non-tender, non-distended Musculoskeletal:  No acute deformity Skin:  Mottling to BLE up entire leg  LABS:  CBC  Recent Labs Lab 06/18/15 0242  WBC 7.7  HGB 15.7  HCT 46.5  PLT 386   Coag's No results for input(s): APTT, INR in the last 168 hours. BMET  Recent Labs Lab 06/18/15 0242  NA 132*  K <2.0*  CL 89*  CO2 13*  BUN 15  CREATININE 1.21  GLUCOSE 251*   Electrolytes  Recent Labs Lab 06/18/15 0242  CALCIUM 9.5   Sepsis Markers No results for input(s): LATICACIDVEN, PROCALCITON, O2SATVEN in the last 168 hours. ABG No results for input(s): PHART, PCO2ART, PO2ART in the last 168 hours. Liver  Enzymes  Recent Labs Lab 06/18/15 0242  AST 47*  ALT 19  ALKPHOS 112  BILITOT 1.1  ALBUMIN 3.1*   Cardiac Enzymes  Recent Labs Lab 06/18/15 0242  TROPONINI 0.04*   Glucose No results for input(s): GLUCAP in the last 168 hours.  Imaging Dg Chest Port 1 View  06/18/2015   CLINICAL DATA:  Respiratory distress  EXAM: PORTABLE CHEST - 1 VIEW  COMPARISON:  06/12/2015  FINDINGS: There is marked cardiomegaly, unchanged. There are mild vascular and interstitial congestive changes, worsened from 06/12/2015. There is no large effusion. There is no dense airspace consolidation.  IMPRESSION: New/ worsened vascular and interstitial congestive changes. Marked unchanged cardiomegaly   Electronically Signed   By: Ellery Plunk M.D.   On: 06/18/2015 02:34     ASSESSMENT / PLAN:  PULMONARY OETT A: Acute hypoxemic respiratory failure Diffuse pulmonary infiltrates, ? Edema, ? amiodarone lung toxicity.  P:   Continue with NRB, wean FiO2 as tolerated to keep SpO2 >  92% ABG Follow CXR Pulmonary hygeine  CARDIOVASCULAR CVL  A:  Shock, etiology uncertain at this time. Hypovolemic vs cardiogenic Concern aortic aneurysm/dissection  P:  Tele MAP goal > 65 mm/Hg Aggressive IVF resuscitation Levophed for MAP goal Will need CT chest/abdomen/pelvis once more stable to r/o dissection Place CVL Place Art line CVP monitoring Trend lactic Trend troponin Echo  RENAL A:   Hypokalemia < 2 in ED AKI High AG metabolic acidosis, suspect lactic Hypochloremia   P:   Aggressive volume resuscitation Replete K 4 runs now Follow Bmet Holding diuresis   GASTROINTESTINAL A:   Abdominal pain. ? Ischemia, peritonitis, obstruction Constipation  P:   NPO NGT to low intermittent suction Protonix for SUP CT abdomen pending General surgery consulted  HEMATOLOGIC A:   No acute issues  P:  Follow CBC Heparin for VTE ppx  INFECTIOUS A:   Recent RLE cellulitis on clindamycin  P:    BCx2 8/4 >>> UC 8/4 >>> Sputum 8/4 >>> Abx: clinda pre-hosp >>> PCT  ENDOCRINE A:   Hyperglycemia without history of DM  P:   Check UA for ketones CBG monitoring, SSI  NEUROLOGIC A:   Acute metabolic encephalopathy   P:   RASS goal: 0 Monitor   FAMILY  - Updates:   - Inter-disciplinary family meet or Palliative Care meeting due by:  8/11   Joneen Roach, AGACNP-BC [facility] Pager [phone] or [phone]  06/18/2015 5:21 AM

## 2015-06-18 NOTE — Progress Notes (Signed)
Unable to collect ABG. Another RRT unable to collect ABG. MD notified.

## 2015-06-18 NOTE — Progress Notes (Signed)
Initial Nutrition Assessment  DOCUMENTATION CODES:   Morbid obesity  INTERVENTION:   -RD will follow for diet advancement and supplement diet as appropriate  NUTRITION DIAGNOSIS:   Inadequate oral intake related to altered GI function as evidenced by NPO status.  GOAL:   Patient will meet greater than or equal to 90% of their needs  MONITOR:   Diet advancement, Labs, Weight trends, Skin, I & O's  REASON FOR ASSESSMENT:   Malnutrition Screening Tool    ASSESSMENT:   69 year old male s/p CABG 7/8 presented to ED 8/4 after losing consciousness on toilet. In ED he was hypoxic requiring NRB and hypotensive. PCCM called for admission.   Pt admitted for septic shock. Per MD notes, may be due to an intra-abdominal source due to either ischemic or infectious colitis. Surgery has been consulted.   Pt s/p CABG on 05/22/15.   NGT has been placed for decompression. Currently NPO  Unable to complete Nutrition-Focused physical exam at this time.   Wt hx difficult to assess at this time. Noted a 11.4% wt loss over the past 7 months, however, may be due to fluid loss.   Noted pt tested negative for C-diff.   Diet Order:  Diet NPO time specified  Skin:  Reviewed, no issues (closed chest and leg incisions)  Last BM:  06/18/15  Height:   Ht Readings from Last 1 Encounters:  06/18/15  (1.753 m)    Weight:   Wt Readings from Last 1 Encounters:  06/18/15 279 lb 15.8 oz (127 kg)    Ideal Body Weight:  72.7 kg  BMI:  Body mass index is 41.33 kg/(m^2).  Estimated Nutritional Needs:   Kcal:  2000-2200  Protein:  110-125 grams  Fluid:  2.0-2.2 L  EDUCATION NEEDS:   No education needs identified at this time  Ansley Mangiapane A. Mayford Knife, RD, LDN, CDE Pager: [phone] After hours Pager: [phone]

## 2015-06-18 NOTE — Consult Note (Signed)
Reason for Consult:abd pain Referring Physician: [name] is an 69 y.o. male.  HPI: 55 yom who is s/p cab in early July and has done pretty well from this. He did have cellulitis of right leg that I think was treated with clindamycin.  This week he has begun having some constipation and has attempted multiple methods to have a bm.  This included an enema last night.  After that he began having profuse watery diarrhea (no blood).  He passed out and was not responsive.  His wife called ems and he was brought to er He now complains only of back pain and being uncomfortable in the bed.    Past Medical History  Diagnosis Date  . Anxiety   . Hypertension   . Arthritis   . Sleep apnea     wears CPAP nightly  . Glaucoma     Past Surgical History  Procedure Laterality Date  . Hernia repair      right  . Eye surgery      bil cataract  . Wisdom tooth extraction    . Fasciotomy foot / toe Left     foot  . Cardiac catheterization N/A 05/20/2015    Procedure: Left Heart Cath and Coronary Angiography;  Surgeon: Burnell Blanks, MD;  Location: [facility];  Service: Cardiovascular;  Laterality: N/A;  . Coronary artery bypass graft N/A 05/22/2015    Procedure: CORONARY ARTERY BYPASS GRAFTING (CABG), ON PUMP, TIMES THREE, USING LEFT INTERNAL MAMMARY ARTERY, RIGHT GREATER SAPHENOUS VEIN HARVESTED ENDOSCOPICALLY;  Surgeon: Ivin Poot, MD;  Location: [facility];  Service: Open Heart Surgery;  Laterality: N/A;  LIMA-LAD, SVG-OM, SVG-PD  . Tee without cardioversion N/A 05/22/2015    Procedure: TRANSESOPHAGEAL ECHOCARDIOGRAM (TEE);  Surgeon: Ivin Poot, MD;  Location: [facility];  Service: Open Heart Surgery;  Laterality: N/A;    No family history on file.  Social History:  reports that he has never smoked. He has never used smokeless tobacco. He reports that he does not drink alcohol or use illicit drugs.  Allergies:  Allergies  Allergen Reactions  . Penicillins Hives   . Doxycycline Rash  . Levaquin [Levofloxacin In D5w] Nausea Only and Other (See Comments)    Dizziness     Medications: I have reviewed the patient's current medications.  Results for orders placed or performed during the hospital encounter of 06/18/15 (from the past 48 hour(s))  CBC with Differential/Platelet     Status: None   Collection Time: 06/18/15  2:42 AM  Result Value Ref Range   WBC 7.7 4.0 - 10.5 K/uL   RBC 5.41 4.22 - 5.81 MIL/uL   Hemoglobin 15.7 13.0 - 17.0 g/dL   HCT 46.5 39.0 - 52.0 %   MCV 86.0 78.0 - 100.0 fL   MCH 29.0 26.0 - 34.0 pg   MCHC 33.8 30.0 - 36.0 g/dL   RDW 14.1 11.5 - 15.5 %   Platelets 386 150 - 400 K/uL   Neutrophils Relative % 56 43 - 77 %   Lymphocytes Relative 33 12 - 46 %   Monocytes Relative 7 3 - 12 %   Eosinophils Relative 3 0 - 5 %   Basophils Relative 1 0 - 1 %   Neutro Abs 4.4 1.7 - 7.7 K/uL   Lymphs Abs 2.5 0.7 - 4.0 K/uL   Monocytes Absolute 0.5 0.1 - 1.0 K/uL   Eosinophils Absolute 0.2 0.0 - 0.7 K/uL  Basophils Absolute 0.1 0.0 - 0.1 K/uL   RBC Morphology BURR CELLS   Comprehensive metabolic panel     Status: Abnormal   Collection Time: 06/18/15  2:42 AM  Result Value Ref Range   Sodium 132 (L) 135 - 145 mmol/L   Potassium <2.0 (LL) 3.5 - 5.1 mmol/L    Comment: REPEATED TO VERIFY CRITICAL RESULT CALLED TO, READ BACK BY AND VERIFIED WITH: SANGELANG B,RN 06/18/15 0341 WAYK    Chloride 89 (L) 101 - 111 mmol/L   CO2 13 (L) 22 - 32 mmol/L   Glucose, Bld 251 (H) 65 - 99 mg/dL   BUN 15 6 - 20 mg/dL   Creatinine, Ser 1.21 0.61 - 1.24 mg/dL   Calcium 9.5 8.9 - 10.3 mg/dL   Total Protein 6.3 (L) 6.5 - 8.1 g/dL   Albumin 3.1 (L) 3.5 - 5.0 g/dL   AST 47 (H) 15 - 41 U/L   ALT 19 17 - 63 U/L   Alkaline Phosphatase 112 38 - 126 U/L   Total Bilirubin 1.1 0.3 - 1.2 mg/dL   GFR calc non Af Amer 59 (L) >60 mL/min   GFR calc Af Amer >60 >60 mL/min    Comment: (NOTE) The eGFR has been calculated using the CKD EPI equation. This  calculation has not been validated in all clinical situations. eGFR's persistently <60 mL/min signify possible Chronic Kidney Disease.    Anion gap 30 (H) 5 - 15  Troponin I     Status: Abnormal   Collection Time: 06/18/15  2:42 AM  Result Value Ref Range   Troponin I 0.04 (H) <0.031 ng/mL    Comment:        PERSISTENTLY INCREASED TROPONIN VALUES IN THE RANGE OF 0.04-0.49 ng/mL CAN BE SEEN IN:       -UNSTABLE ANGINA       -CONGESTIVE HEART FAILURE       -MYOCARDITIS       -CHEST TRAUMA       -ARRYHTHMIAS       -LATE PRESENTING MYOCARDIAL INFARCTION       -COPD   CLINICAL FOLLOW-UP RECOMMENDED.   Brain natriuretic peptide     Status: Abnormal   Collection Time: 06/18/15  2:43 AM  Result Value Ref Range   B Natriuretic Peptide 105.1 (H) 0.0 - 100.0 pg/mL  I-stat troponin, ED     Status: None   Collection Time: 06/18/15  2:48 AM  Result Value Ref Range   Troponin i, poc 0.07 0.00 - 0.08 ng/mL   Comment 3            Comment: Due to the release kinetics of cTnI, a negative result within the first hours of the onset of symptoms does not rule out myocardial infarction with certainty. If myocardial infarction is still suspected, repeat the test at appropriate intervals.   Beta-hydroxybutyric acid     Status: None   Collection Time: 06/18/15  3:55 AM  Result Value Ref Range   Beta-Hydroxybutyric Acid 0.27 0.05 - 0.27 mmol/L  Lactic acid, plasma     Status: Abnormal   Collection Time: 06/18/15  4:52 AM  Result Value Ref Range   Lactic Acid, Venous 15.0 (HH) 0.5 - 2.0 mmol/L    Comment: RESULTS CONFIRMED BY MANUAL DILUTION REPEATED TO VERIFY CRITICAL RESULT CALLED TO, READ BACK BY AND VERIFIED WITH: SANGELANG B,RN 06/18/15 0550 WAYK   Magnesium     Status: None   Collection Time: 06/18/15  6:58 AM  Result Value Ref Range   Magnesium 1.8 1.7 - 2.4 mg/dL  Phosphorus     Status: Abnormal   Collection Time: 06/18/15  6:58 AM  Result Value Ref Range   Phosphorus 5.7 (H) 2.5  - 4.6 mg/dL  CBC     Status: Abnormal   Collection Time: 06/18/15  6:58 AM  Result Value Ref Range   WBC 18.9 (H) 4.0 - 10.5 K/uL   RBC 5.31 4.22 - 5.81 MIL/uL   Hemoglobin 15.7 13.0 - 17.0 g/dL   HCT 45.4 39.0 - 52.0 %   MCV 85.5 78.0 - 100.0 fL   MCH 29.6 26.0 - 34.0 pg   MCHC 34.6 30.0 - 36.0 g/dL   RDW 14.2 11.5 - 15.5 %   Platelets 311 150 - 400 K/uL  Protime-INR     Status: Abnormal   Collection Time: 06/18/15  6:58 AM  Result Value Ref Range   Prothrombin Time 20.6 (H) 11.6 - 15.2 seconds   INR 1.78 (H) 0.00 - 1.49  Culture, blood (routine x 2)     Status: None (Preliminary result)   Collection Time: 06/18/15  6:59 AM  Result Value Ref Range   Specimen Description BLOOD LEFT HAND    Special Requests BOTTLES DRAWN AEROBIC ONLY 5CC    Culture PENDING    Report Status PENDING     Ct Head Wo Contrast  06/18/2015   CLINICAL DATA:  69 year old hypertensive male with brief episode of syncope during bowel movement. Initial encounter.  EXAM: CT HEAD WITHOUT CONTRAST  TECHNIQUE: Contiguous axial images were obtained from the base of the skull through the vertex without intravenous contrast.  COMPARISON:  None.  FINDINGS: No intracranial hemorrhage.  No CT evidence of large acute infarct.  Superior right convexity cerebral spinal fluid collection with maximal transverse dimension 1.2 cm more suggestive of a small arachnoid cyst given the slight deformity of the adjacent superior sagittal sinus and gyrus rather than prominent sulcus. If further delineation were clinically desired MR would be necessary. Otherwise no evidence of intracranial mass lesion detected on this unenhanced exam.  Ventricles top-normal size.  Vascular calcifications.  Post lens replacements.  Minimal exophthalmos.  Small amount of gas left cavernous sinus region and left infratemporal fossa most likely related to in injected air.  IMPRESSION: No intracranial hemorrhage or CT evidence of large acute infarct.  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  Ventricles top-normal size.   Electronically Signed   By: Genia Del M.D.   On: 06/18/2015 07:08   Ct Angio Chest Pe W/cm &/or Wo Cm  06/18/2015   CLINICAL DATA:  Syncopal episode on toilet.  EXAM: CT ANGIOGRAPHY CHEST  CT ABDOMEN AND PELVIS WITH CONTRAST  TECHNIQUE: Multidetector CT imaging of the chest was performed using the standard protocol during bolus administration of intravenous contrast. Multiplanar CT image reconstructions and MIPs were obtained to evaluate the vascular anatomy. Multidetector CT imaging of the abdomen and pelvis was performed using the standard protocol during bolus administration of intravenous contrast.  CONTRAST:  195m OMNIPAQUE IOHEXOL 350 MG/ML SOLN  COMPARISON:  Chest radiograph June 18, 2015 at 2:24 a.m.  FINDINGS: CTA CHEST FINDINGS  Please note, due to the reported cracked tubing and back and arm, limited contrast administration, non angiographic phase.  Mediastinum: Heart size is moderate to severely enlarged, small pericardial effusion. Status post median sternotomy for CABG. Thoracic aorta is normal in course and caliber with moderate calcific atherosclerosis. Pulmonary  vascular congestion.  Lungs: Small LEFT pleural effusion. Patchy ground-glass opacities LEFT lower lobe and, in the bilateral hila. Tracheobronchial tree is patent and midline, no pneumothorax.  Soft tissues and osseous structures: Large body habitus. No suspicious bony lesions or acute osseous process.  CT ABDOMEN and PELVIS FINDINGS (respiratory motion degraded examination)  SOLID ORGANS: The liver, spleen, gallbladder, pancreas and adrenal glands are unremarkable.  GASTROINTESTINAL TRACT: Moderate to large amount of distal retained large bowel stool. Gas distended colon up to 6.7 cm. Focal hepatic flexure pneumatosis. Scattered small bowel air-fluid levels. The stomach, small bowel are normal in course and caliber without  inflammatory changes. The appendix is not discretely identified, however there are no inflammatory changes in the right lower quadrant.  KIDNEYS/ URINARY TRACT: Kidneys are orthotopic, demonstrating symmetric enhancement. 4 mm RIGHT upper pole, 3 mm RIGHT upper pole nephrolithiasis, 4 mm LEFT lower pole nephrolithiasis. No hydronephrosis or solid renal masses. The unopacified ureters are normal in course and caliber. Delayed imaging through the kidneys demonstrates symmetric prompt contrast excretion within the proximal urinary collecting system. Urinary bladder is partially distended, 4 mm calculus dependent in RIGHT bladder.  PERITONEUM/RETROPERITONEUM: Aortoiliac vessels are normal in course and caliber, mild calcific atherosclerosis. No lymphadenopathy by CT size criteria. Prostate is enlarged, invading the base the urinary bladder. No intraperitoneal free fluid nor free air.  SOFT TISSUE/OSSEOUS STRUCTURES: Non-suspicious. Moderate fat containing LEFT inguinal hernia. Severe degenerative change of the lumbar spine, grade 1 L4-5 anterolisthesis with multi level severe neural foraminal narrowing, severe canal stenosis L4-5, moderate to severe L2-3 and L3-4.  Review of the MIP images confirms the above findings.  IMPRESSION: CTA CHEST:  Limited CTA of the chest, poor bolus timing.  Moderate to severe cardiomegaly with small pericardial effusion. Evidence of pulmonary edema with small pleural effusions and, patchy airspace opacities most consistent with confluent edema.  CT ABDOMEN AND PELVIS: Focal colonic hepatic flexure pneumatosis without bowel perforation. Moderate to large amount of distal retained large bowel stool. Mild probable ileus, no bowel obstruction.  Bilateral nonobstructing nephrolithiasis measuring up to 4 mm.   Electronically Signed   By: Elon Alas M.D.   On: 06/18/2015 07:00   Ct Abdomen Pelvis W Contrast  06/18/2015   CLINICAL DATA:  Syncopal episode on toilet.  EXAM: CT ANGIOGRAPHY  CHEST  CT ABDOMEN AND PELVIS WITH CONTRAST  TECHNIQUE: Multidetector CT imaging of the chest was performed using the standard protocol during bolus administration of intravenous contrast. Multiplanar CT image reconstructions and MIPs were obtained to evaluate the vascular anatomy. Multidetector CT imaging of the abdomen and pelvis was performed using the standard protocol during bolus administration of intravenous contrast.  CONTRAST:  174m OMNIPAQUE IOHEXOL 350 MG/ML SOLN  COMPARISON:  Chest radiograph June 18, 2015 at 2:24 a.m.  FINDINGS: CTA CHEST FINDINGS  Please note, due to the reported cracked tubing and back and arm, limited contrast administration, non angiographic phase.  Mediastinum: Heart size is moderate to severely enlarged, small pericardial effusion. Status post median sternotomy for CABG. Thoracic aorta is normal in course and caliber with moderate calcific atherosclerosis. Pulmonary vascular congestion.  Lungs: Small LEFT pleural effusion. Patchy ground-glass opacities LEFT lower lobe and, in the bilateral hila. Tracheobronchial tree is patent and midline, no pneumothorax.  Soft tissues and osseous structures: Large body habitus. No suspicious bony lesions or acute osseous process.  CT ABDOMEN and PELVIS FINDINGS (respiratory motion degraded examination)  SOLID ORGANS: The liver, spleen, gallbladder, pancreas and adrenal glands are unremarkable.  GASTROINTESTINAL TRACT: Moderate to large amount of distal retained large bowel stool. Gas distended colon up to 6.7 cm. Focal hepatic flexure pneumatosis. Scattered small bowel air-fluid levels. The stomach, small bowel are normal in course and caliber without inflammatory changes. The appendix is not discretely identified, however there are no inflammatory changes in the right lower quadrant.  KIDNEYS/ URINARY TRACT: Kidneys are orthotopic, demonstrating symmetric enhancement. 4 mm RIGHT upper pole, 3 mm RIGHT upper pole nephrolithiasis, 4 mm LEFT  lower pole nephrolithiasis. No hydronephrosis or solid renal masses. The unopacified ureters are normal in course and caliber. Delayed imaging through the kidneys demonstrates symmetric prompt contrast excretion within the proximal urinary collecting system. Urinary bladder is partially distended, 4 mm calculus dependent in RIGHT bladder.  PERITONEUM/RETROPERITONEUM: Aortoiliac vessels are normal in course and caliber, mild calcific atherosclerosis. No lymphadenopathy by CT size criteria. Prostate is enlarged, invading the base the urinary bladder. No intraperitoneal free fluid nor free air.  SOFT TISSUE/OSSEOUS STRUCTURES: Non-suspicious. Moderate fat containing LEFT inguinal hernia. Severe degenerative change of the lumbar spine, grade 1 L4-5 anterolisthesis with multi level severe neural foraminal narrowing, severe canal stenosis L4-5, moderate to severe L2-3 and L3-4.  Review of the MIP images confirms the above findings.  IMPRESSION: CTA CHEST:  Limited CTA of the chest, poor bolus timing.  Moderate to severe cardiomegaly with small pericardial effusion. Evidence of pulmonary edema with small pleural effusions and, patchy airspace opacities most consistent with confluent edema.  CT ABDOMEN AND PELVIS: Focal colonic hepatic flexure pneumatosis without bowel perforation. Moderate to large amount of distal retained large bowel stool. Mild probable ileus, no bowel obstruction.  Bilateral nonobstructing nephrolithiasis measuring up to 4 mm.   Electronically Signed   By: Elon Alas M.D.   On: 06/18/2015 07:00   Dg Chest Port 1 View  06/18/2015   CLINICAL DATA:  Respiratory distress  EXAM: PORTABLE CHEST - 1 VIEW  COMPARISON:  06/12/2015  FINDINGS: There is marked cardiomegaly, unchanged. There are mild vascular and interstitial congestive changes, worsened from 06/12/2015. There is no large effusion. There is no dense airspace consolidation.  IMPRESSION: New/ worsened vascular and interstitial congestive  changes. Marked unchanged cardiomegaly   Electronically Signed   By: Andreas Newport M.D.   On: 06/18/2015 02:34    Review of Systems  Constitutional: Negative for fever and chills.  Cardiovascular: Negative for chest pain.  Gastrointestinal: Negative for nausea, vomiting, abdominal pain, diarrhea and blood in stool.   Blood pressure 84/83, pulse 98, resp. rate 22, SpO2 100 %. Physical Exam  Vitals reviewed. Eyes: No scleral icterus.  Neck: Neck supple.  Cardiovascular: Normal rate and regular rhythm.   Respiratory: Effort normal and breath sounds normal.    GI:  Very obese, difficult to tell anything on his exam, he is not tender right now. I cannot hear bowel sounds   Skin: He is diaphoretic.  Appears mottled from chest down    Assessment/Plan: Colitis  This is likely ischemic but certainly possibility this could be abx related also.  I dont think he needs surgery right now but this is certainly possibility if he does not respond to treatment.  His exam is not concerning right now but this is also difficult due to his habitus.  I discussed possibility of colectomy/colostomy with him and his wife.  Plan is for icu admission, needs volume status optimized and blood pressure as well.  Will plan on reexamining later today.   Jonathan Le 06/18/2015, 7:45 AM

## 2015-06-18 NOTE — Progress Notes (Signed)
Patient ID: Jonathan Le, male   DOB: 02-Jul-1946, 69 y.o.   MRN: [id number] Last lactate still high, has pressor requirement, no longer mottled, he just has bilateral low back pain, minimal abdominal tenderness, has more labs pending.  c diff negative. i am still concerned he could need surgery for this but will continue resuscitation, await labs for now.

## 2015-06-18 NOTE — Progress Notes (Signed)
Patient ID: Jonathan Le, male   DOB: 24-Apr-1946, 69 y.o.   MRN: [id number] During the day has not cleared lactate. I reviewed ct again with radiology and while I was doing that he coded.  I plan on taking him to or asap for laparotomy, likely colectomy, likely open abdomen. I have discussed this with his wife. Will proceed asap.  Will remain intubated postop and likely need further surgery.

## 2015-06-18 NOTE — Procedures (Signed)
Central Venous Catheter Insertion Procedure Note Jonathan Le [id number] May 02, 1946  Procedure: Insertion of Central Venous Catheter Indications: Assessment of intravascular volume, Drug and/or fluid administration and Frequent blood sampling  Procedure Details Consent: Risks of procedure as well as the alternatives and risks of each were explained to the (patient/caregiver).  Consent for procedure obtained. Time Out: Verified patient identification, verified procedure, site/side was marked, verified correct patient position, special equipment/implants available, medications/allergies/relevent history reviewed, required imaging and test results available.  Performed  Maximum sterile technique was used including antiseptics, cap, gloves, gown, hand hygiene, mask and sheet. Skin prep: Chlorhexidine; local anesthetic administered A antimicrobial bonded/coated triple lumen catheter was placed in the left internal jugular vein using the Seldinger technique.  Sutured at 20 cm.   Evaluation Blood flow good Complications: No apparent complications Patient did tolerate procedure well. Chest X-ray ordered to verify placement.  CXR: pending.  Procedure performed under direct supervision of Dr. Jamison Neighbor and with ultrasound guidance for real time vessel cannulation.     Canary Brim, NP-C [facility] Pgr: [phone] or if no answer [phone] 06/18/2015, 10:47 AM

## 2015-06-18 NOTE — Progress Notes (Addendum)
ANTIBIOTIC CONSULT NOTE - INITIAL  Pharmacy Consult for fortaz, vancomycin Indication: r/o sepsis  Allergies  Allergen Reactions  . Penicillins Hives  . Doxycycline Rash  . Levaquin [Levofloxacin In D5w] Nausea Only and Other (See Comments)    Dizziness     Patient Measurements: Height: 5\' 9"  (175.3 cm) Weight: 279 lb 15.8 oz (127 kg) IBW/kg (Calculated) : 70.7  Vital Signs: Temp: 98.4 F (36.9 C) (08/04 0800) Temp Source: Oral (08/04 0800) BP: 81/46 mmHg (08/04 0945) Pulse Rate: 99 (08/04 0945) Intake/Output from previous day:   Intake/Output from this shift: Total I/O In: 175 [I.V.:75; IV Piggyback:100] Out: 200 [Urine:200]  Labs:  Recent Labs  06/18/15 0242 06/18/15 0658  WBC 7.7 18.9*  HGB 15.7 15.7  PLT 386 311  CREATININE 1.21  --    Estimated Creatinine Clearance: 76 mL/min (by C-G formula based on Cr of 1.21). No results for input(s): VANCOTROUGH, VANCOPEAK, VANCORANDOM, GENTTROUGH, GENTPEAK, GENTRANDOM, TOBRATROUGH, TOBRAPEAK, TOBRARND, AMIKACINPEAK, AMIKACINTROU, AMIKACIN in the last 72 hours.   Microbiology: Recent Results (from the past 720 hour(s))  MRSA PCR Screening     Status: None   Collection Time: 05/20/15 12:21 AM  Result Value Ref Range Status   MRSA by PCR NEGATIVE NEGATIVE Final    Comment:        The GeneXpert MRSA Assay (FDA approved for NASAL specimens only), is one component of a comprehensive MRSA colonization surveillance program. It is not intended to diagnose MRSA infection nor to guide or monitor treatment for MRSA infections.   Surgical pcr screen     Status: None   Collection Time: 05/20/15  7:11 PM  Result Value Ref Range Status   MRSA, PCR NEGATIVE NEGATIVE Final   Staphylococcus aureus NEGATIVE NEGATIVE Final    Comment:        The Xpert SA Assay (FDA approved for NASAL specimens in patients over 51 years of age), is one component of a comprehensive surveillance program.  Test performance has been  validated by [facility] for patients greater than or equal to 19 year old. It is not intended to diagnose infection nor to guide or monitor treatment.   Culture, blood (routine x 2)     Status: None (Preliminary result)   Collection Time: 06/18/15  6:53 AM  Result Value Ref Range Status   Specimen Description BLOOD RIGHT HAND  Final   Special Requests BOTTLES DRAWN AEROBIC AND ANAEROBIC 5CC  Final   Culture PENDING  Incomplete   Report Status PENDING  Incomplete  Culture, blood (routine x 2)     Status: None (Preliminary result)   Collection Time: 06/18/15  6:59 AM  Result Value Ref Range Status   Specimen Description BLOOD LEFT HAND  Final   Special Requests BOTTLES DRAWN AEROBIC ONLY 5CC  Final   Culture PENDING  Incomplete   Report Status PENDING  Incomplete    Medical History: Past Medical History  Diagnosis Date  . Anxiety   . Hypertension   . Arthritis   . Sleep apnea     wears CPAP nightly  . Glaucoma     Medications:  Prescriptions prior to admission  Medication Sig Dispense Refill Last Dose  . acetaminophen (TYLENOL) 500 MG tablet Take 1,500 mg by mouth every 6 (six) hours as needed for headache.   06/17/2015 at Unknown time  . ALPRAZolam (XANAX) 1 MG tablet Take 1 tablet (1 mg total) by mouth 2 (two) times daily. 10 tablet 0 06/17/2015 at Unknown time  .  amiodarone (PACERONE) 200 MG tablet Take 1 tablet (200 mg total) by mouth daily. 30 tablet 1 06/17/2015 at Unknown time  . aspirin EC 325 MG EC tablet Take 1 tablet (325 mg total) by mouth daily. 30 tablet 0 06/17/2015 at Unknown time  . atorvastatin (LIPITOR) 80 MG tablet Take 1 tablet (80 mg total) by mouth daily at 6 PM. 30 tablet 3 06/17/2015 at Unknown time  . clindamycin (CLEOCIN) 300 MG capsule Take 1 capsule (300 mg total) by mouth 3 (three) times daily. 30 capsule 0 06/17/2015 at Unknown time  . finasteride (PROSCAR) 5 MG tablet Take 5 mg by mouth daily.   06/17/2015 at Unknown time  . furosemide (LASIX) 40 MG tablet  TAKE 1 TABLET BY MOUTH DAILY 90 tablet 0 06/17/2015 at Unknown time  . lactulose, encephalopathy, (CHRONULAC) 10 GM/15ML SOLN Take 20 g by mouth daily as needed (for constipation).   06/17/2015 at Unknown time  . lisinopril (PRINIVIL,ZESTRIL) 5 MG tablet Take 1 tablet (5 mg total) by mouth daily. 30 tablet 3 06/17/2015 at Unknown time  . metolazone (ZAROXOLYN) 5 MG tablet TAKE 1 TABLET BY MOUTH EVERY DAY 90 tablet 0 06/17/2015 at Unknown time  . potassium chloride SA (K-DUR,KLOR-CON) 20 MEQ tablet TAKE 1 TABLET BY MOUTH DAILY 90 tablet 0 06/17/2015 at Unknown time  . sodium phosphate (FLEET) enema Place 1 enema rectally once. follow package directions   06/17/2015 at Unknown time   Scheduled:  . heparin  5,000 Units Subcutaneous 3 times per day  . insulin aspart  2-6 Units Subcutaneous 6 times per day  . metronidazole  500 mg Intravenous Q8H  . pantoprazole (PROTONIX) IV  40 mg Intravenous QHS  . potassium chloride  10 mEq Intravenous Q1 Hr x 4    Assessment: 69 yo male with recent CABG and cellulitis and here with septic shock (per MD- likely from an intra-abdominal source due to either ischemic or infectious colitis).  Pharmacy has been consulted to dose fortaz, vancomycin and fluconazole. WBC= 18,9, afebrile, SCr= 1.66 (up from 1.21 this am), CrCl ~ 55 (normalized ~ 45), LA = 8.4 (down from 15) and PCT= 5.42.   8/4 fortaz>> 8/4 vanco>> 8/4 flagyl>> 8/4 fluconazole  8/4 blood x2  Goal of Therapy:  Vancomycin trough level 15-20 mcg/ml  Plan:  -Fortaz 2gm IV q12h -Vancomycin  IV followed by  IV q24hr -Fluconazole  IV q24hr -Will follow renal function, cultures and clinical progress  Harland German, Pharm D 06/18/2015 10:26 AM

## 2015-06-18 NOTE — ED Notes (Signed)
INPUT = 1500 ML NS

## 2015-06-18 NOTE — Care Management Note (Signed)
Case Management Note  Patient Details  Name: Jonathan Le MRN: [id number] Date of Birth: 1946-11-14  Subjective/Objective:         Adm w shock           Action/Plan: lives w wife, pcp dr Elizabeth Palau   Expected Discharge Date:                  Expected Discharge Plan:  Home w Home Health Services  In-House Referral:     Discharge planning Services     Post Acute Care Choice:    Choice offered to:     DME Arranged:    DME Agency:     HH Arranged:    HH Agency:     Status of Service:  Completed, signed off  Medicare Important Message Given:    Date Medicare IM Given:    Medicare IM give by:    Date Additional Medicare IM Given:    Additional Medicare Important Message give by:     If discussed at Long Length of Stay Meetings, dates discussed:    Additional Comments: ur review done  Hanley Hays, RN 06/18/2015, 1:54 PM

## 2015-06-18 NOTE — Consult Note (Signed)
Reason for Consult:Abdominal pain/ syncope Referring Physician: Dr. Cinda Quest is an 69 y.o. male.  HPI: 69 yo man who had CABG x 3 (PVT) in early July. Had atrial fibrillation postop, discharged on amiodarone. Recently treated for cellulitis. Had Bactrim initially but did not clear. Then given Levaquin but had adverse reaction to it. Due to allergies and med interactions with amiodarone, switched to clindamycin. He has been constipated. He saw Dr. Prescott Gum yesterday and was given prescription for laxative and enema. Presented to ED this AM with syncopal spell. Was profoundly hypotensive with a lactic acidosis on arrival. Volume resuscitated.  He currently is complaining of low back pain and abdominal bloating and pain.  Past Medical History  Diagnosis Date  . Anxiety   . Hypertension   . Arthritis   . Sleep apnea     wears CPAP nightly  . Glaucoma     Past Surgical History  Procedure Laterality Date  . Hernia repair      right  . Eye surgery      bil cataract  . Wisdom tooth extraction    . Fasciotomy foot / toe Left     foot  . Cardiac catheterization N/A 05/20/2015    Procedure: Left Heart Cath and Coronary Angiography;  Surgeon: Burnell Blanks, MD;  Location: [facility];  Service: Cardiovascular;  Laterality: N/A;  . Coronary artery bypass graft N/A 05/22/2015    Procedure: CORONARY ARTERY BYPASS GRAFTING (CABG), ON PUMP, TIMES THREE, USING LEFT INTERNAL MAMMARY ARTERY, RIGHT GREATER SAPHENOUS VEIN HARVESTED ENDOSCOPICALLY;  Surgeon: Ivin Poot, MD;  Location: [facility];  Service: Open Heart Surgery;  Laterality: N/A;  LIMA-LAD, SVG-OM, SVG-PD  . Tee without cardioversion N/A 05/22/2015    Procedure: TRANSESOPHAGEAL ECHOCARDIOGRAM (TEE);  Surgeon: Ivin Poot, MD;  Location: [facility];  Service: Open Heart Surgery;  Laterality: N/A;    No family history on file.  Social History:  reports that he has never smoked. He has never used smokeless tobacco. He  reports that he does not drink alcohol or use illicit drugs.  Allergies:  Allergies  Allergen Reactions  . Penicillins Hives  . Doxycycline Rash  . Levaquin [Levofloxacin In D5w] Nausea Only and Other (See Comments)    Dizziness     Medications:  Prior to Admission:  Prescriptions prior to admission  Medication Sig Dispense Refill Last Dose  . acetaminophen (TYLENOL) 500 MG tablet Take 1,500 mg by mouth every 6 (six) hours as needed for headache.   06/17/2015 at Unknown time  . ALPRAZolam (XANAX) 1 MG tablet Take 1 tablet (1 mg total) by mouth 2 (two) times daily. 10 tablet 0 06/17/2015 at Unknown time  . amiodarone (PACERONE) 200 MG tablet Take 1 tablet (200 mg total) by mouth daily. 30 tablet 1 06/17/2015 at Unknown time  . aspirin EC 325 MG EC tablet Take 1 tablet (325 mg total) by mouth daily. 30 tablet 0 06/17/2015 at Unknown time  . atorvastatin (LIPITOR) 80 MG tablet Take 1 tablet (80 mg total) by mouth daily at 6 PM. 30 tablet 3 06/17/2015 at Unknown time  . clindamycin (CLEOCIN) 300 MG capsule Take 1 capsule (300 mg total) by mouth 3 (three) times daily. 30 capsule 0 06/17/2015 at Unknown time  . finasteride (PROSCAR) 5 MG tablet Take 5 mg by mouth daily.   06/17/2015 at Unknown time  . furosemide (LASIX) 40 MG tablet TAKE 1 TABLET BY MOUTH DAILY 90 tablet 0  06/17/2015 at Unknown time  . lactulose, encephalopathy, (CHRONULAC) 10 GM/15ML SOLN Take 20 g by mouth daily as needed (for constipation).   06/17/2015 at Unknown time  . lisinopril (PRINIVIL,ZESTRIL) 5 MG tablet Take 1 tablet (5 mg total) by mouth daily. 30 tablet 3 06/17/2015 at Unknown time  . metolazone (ZAROXOLYN) 5 MG tablet TAKE 1 TABLET BY MOUTH EVERY DAY 90 tablet 0 06/17/2015 at Unknown time  . potassium chloride SA (K-DUR,KLOR-CON) 20 MEQ tablet TAKE 1 TABLET BY MOUTH DAILY 90 tablet 0 06/17/2015 at Unknown time  . sodium phosphate (FLEET) enema Place 1 enema rectally once. follow package directions   06/17/2015 at Unknown time     Results for orders placed or performed during the hospital encounter of 06/18/15 (from the past 48 hour(s))  CBC with Differential/Platelet     Status: None   Collection Time: 06/18/15  2:42 AM  Result Value Ref Range   WBC 7.7 4.0 - 10.5 K/uL   RBC 5.41 4.22 - 5.81 MIL/uL   Hemoglobin 15.7 13.0 - 17.0 g/dL   HCT 46.5 39.0 - 52.0 %   MCV 86.0 78.0 - 100.0 fL   MCH 29.0 26.0 - 34.0 pg   MCHC 33.8 30.0 - 36.0 g/dL   RDW 14.1 11.5 - 15.5 %   Platelets 386 150 - 400 K/uL   Neutrophils Relative % 56 43 - 77 %   Lymphocytes Relative 33 12 - 46 %   Monocytes Relative 7 3 - 12 %   Eosinophils Relative 3 0 - 5 %   Basophils Relative 1 0 - 1 %   Neutro Abs 4.4 1.7 - 7.7 K/uL   Lymphs Abs 2.5 0.7 - 4.0 K/uL   Monocytes Absolute 0.5 0.1 - 1.0 K/uL   Eosinophils Absolute 0.2 0.0 - 0.7 K/uL   Basophils Absolute 0.1 0.0 - 0.1 K/uL   RBC Morphology BURR CELLS   Comprehensive metabolic panel     Status: Abnormal   Collection Time: 06/18/15  2:42 AM  Result Value Ref Range   Sodium 132 (L) 135 - 145 mmol/L   Potassium <2.0 (LL) 3.5 - 5.1 mmol/L    Comment: REPEATED TO VERIFY CRITICAL RESULT CALLED TO, READ BACK BY AND VERIFIED WITH: SANGELANG B,RN 06/18/15 0341 WAYK    Chloride 89 (L) 101 - 111 mmol/L   CO2 13 (L) 22 - 32 mmol/L   Glucose, Bld 251 (H) 65 - 99 mg/dL   BUN 15 6 - 20 mg/dL   Creatinine, Ser 1.21 0.61 - 1.24 mg/dL   Calcium 9.5 8.9 - 10.3 mg/dL   Total Protein 6.3 (L) 6.5 - 8.1 g/dL   Albumin 3.1 (L) 3.5 - 5.0 g/dL   AST 47 (H) 15 - 41 U/L   ALT 19 17 - 63 U/L   Alkaline Phosphatase 112 38 - 126 U/L   Total Bilirubin 1.1 0.3 - 1.2 mg/dL   GFR calc non Af Amer 59 (L) >60 mL/min   GFR calc Af Amer >60 >60 mL/min    Comment: (NOTE) The eGFR has been calculated using the CKD EPI equation. This calculation has not been validated in all clinical situations. eGFR's persistently <60 mL/min signify possible Chronic Kidney Disease.    Anion gap 30 (H) 5 - 15  Troponin I      Status: Abnormal   Collection Time: 06/18/15  2:42 AM  Result Value Ref Range   Troponin I 0.04 (H) <0.031 ng/mL    Comment:  PERSISTENTLY INCREASED TROPONIN VALUES IN THE RANGE OF 0.04-0.49 ng/mL CAN BE SEEN IN:       -UNSTABLE ANGINA       -CONGESTIVE HEART FAILURE       -MYOCARDITIS       -CHEST TRAUMA       -ARRYHTHMIAS       -LATE PRESENTING MYOCARDIAL INFARCTION       -COPD   CLINICAL FOLLOW-UP RECOMMENDED.   Brain natriuretic peptide     Status: Abnormal   Collection Time: 06/18/15  2:43 AM  Result Value Ref Range   B Natriuretic Peptide 105.1 (H) 0.0 - 100.0 pg/mL  I-stat troponin, ED     Status: None   Collection Time: 06/18/15  2:48 AM  Result Value Ref Range   Troponin i, poc 0.07 0.00 - 0.08 ng/mL   Comment 3            Comment: Due to the release kinetics of cTnI, a negative result within the first hours of the onset of symptoms does not rule out myocardial infarction with certainty. If myocardial infarction is still suspected, repeat the test at appropriate intervals.   Beta-hydroxybutyric acid     Status: None   Collection Time: 06/18/15  3:55 AM  Result Value Ref Range   Beta-Hydroxybutyric Acid 0.27 0.05 - 0.27 mmol/L  Lactic acid, plasma     Status: Abnormal   Collection Time: 06/18/15  4:52 AM  Result Value Ref Range   Lactic Acid, Venous 15.0 (HH) 0.5 - 2.0 mmol/L    Comment: RESULTS CONFIRMED BY MANUAL DILUTION REPEATED TO VERIFY CRITICAL RESULT CALLED TO, READ BACK BY AND VERIFIED WITH: SANGELANG B,RN 06/18/15 0550 WAYK   Culture, blood (routine x 2)     Status: None (Preliminary result)   Collection Time: 06/18/15  6:53 AM  Result Value Ref Range   Specimen Description BLOOD RIGHT HAND    Special Requests BOTTLES DRAWN AEROBIC AND ANAEROBIC 5CC    Culture PENDING    Report Status PENDING   Magnesium     Status: None   Collection Time: 06/18/15  6:58 AM  Result Value Ref Range   Magnesium 1.8 1.7 - 2.4 mg/dL  Phosphorus      Status: Abnormal   Collection Time: 06/18/15  6:58 AM  Result Value Ref Range   Phosphorus 5.7 (H) 2.5 - 4.6 mg/dL  Lactic acid, plasma     Status: Abnormal   Collection Time: 06/18/15  6:58 AM  Result Value Ref Range   Lactic Acid, Venous 8.4 (HH) 0.5 - 2.0 mmol/L    Comment: REPEATED TO VERIFY CRITICAL RESULT CALLED TO, READ BACK BY AND VERIFIED WITH: [facility] [id number] 06/18/15 CLARK,S   CBC     Status: Abnormal   Collection Time: 06/18/15  6:58 AM  Result Value Ref Range   WBC 18.9 (H) 4.0 - 10.5 K/uL   RBC 5.31 4.22 - 5.81 MIL/uL   Hemoglobin 15.7 13.0 - 17.0 g/dL   HCT 45.4 39.0 - 52.0 %   MCV 85.5 78.0 - 100.0 fL   MCH 29.6 26.0 - 34.0 pg   MCHC 34.6 30.0 - 36.0 g/dL   RDW 14.2 11.5 - 15.5 %   Platelets 311 150 - 400 K/uL  Protime-INR     Status: Abnormal   Collection Time: 06/18/15  6:58 AM  Result Value Ref Range   Prothrombin Time 20.6 (H) 11.6 - 15.2 seconds   INR 1.78 (H) 0.00 - 1.49  Culture,  blood (routine x 2)     Status: None (Preliminary result)   Collection Time: 06/18/15  6:59 AM  Result Value Ref Range   Specimen Description BLOOD LEFT HAND    Special Requests BOTTLES DRAWN AEROBIC ONLY 5CC    Culture PENDING    Report Status PENDING     Ct Head Wo Contrast  06/18/2015   CLINICAL DATA:  69 year old hypertensive male with brief episode of syncope during bowel movement. Initial encounter.  EXAM: CT HEAD WITHOUT CONTRAST  TECHNIQUE: Contiguous axial images were obtained from the base of the skull through the vertex without intravenous contrast.  COMPARISON:  None.  FINDINGS: No intracranial hemorrhage.  No CT evidence of large acute infarct.  Superior right convexity cerebral spinal fluid collection with maximal transverse dimension 1.2 cm more suggestive of a small arachnoid cyst given the slight deformity of the adjacent superior sagittal sinus and gyrus rather than prominent sulcus. If further delineation were clinically desired MR would be necessary. Otherwise  no evidence of intracranial mass lesion detected on this unenhanced exam.  Ventricles top-normal size.  Vascular calcifications.  Post lens replacements.  Minimal exophthalmos.  Small amount of gas left cavernous sinus region and left infratemporal fossa most likely related to in injected air.  IMPRESSION: No intracranial hemorrhage or CT evidence of large acute infarct.  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  Ventricles top-normal size.   Electronically Signed   By: Genia Del M.D.   On: 06/18/2015 07:08   Ct Angio Chest Pe W/cm &/or Wo Cm  06/18/2015   CLINICAL DATA:  Syncopal episode on toilet.  EXAM: CT ANGIOGRAPHY CHEST  CT ABDOMEN AND PELVIS WITH CONTRAST  TECHNIQUE: Multidetector CT imaging of the chest was performed using the standard protocol during bolus administration of intravenous contrast. Multiplanar CT image reconstructions and MIPs were obtained to evaluate the vascular anatomy. Multidetector CT imaging of the abdomen and pelvis was performed using the standard protocol during bolus administration of intravenous contrast.  CONTRAST:  167m OMNIPAQUE IOHEXOL 350 MG/ML SOLN  COMPARISON:  Chest radiograph June 18, 2015 at 2:24 a.m.  FINDINGS: CTA CHEST FINDINGS  Please note, due to the reported cracked tubing and back and arm, limited contrast administration, non angiographic phase.  Mediastinum: Heart size is moderate to severely enlarged, small pericardial effusion. Status post median sternotomy for CABG. Thoracic aorta is normal in course and caliber with moderate calcific atherosclerosis. Pulmonary vascular congestion.  Lungs: Small LEFT pleural effusion. Patchy ground-glass opacities LEFT lower lobe and, in the bilateral hila. Tracheobronchial tree is patent and midline, no pneumothorax.  Soft tissues and osseous structures: Large body habitus. No suspicious bony lesions or acute osseous process.  CT ABDOMEN and PELVIS FINDINGS (respiratory motion  degraded examination)  SOLID ORGANS: The liver, spleen, gallbladder, pancreas and adrenal glands are unremarkable.  GASTROINTESTINAL TRACT: Moderate to large amount of distal retained large bowel stool. Gas distended colon up to 6.7 cm. Focal hepatic flexure pneumatosis. Scattered small bowel air-fluid levels. The stomach, small bowel are normal in course and caliber without inflammatory changes. The appendix is not discretely identified, however there are no inflammatory changes in the right lower quadrant.  KIDNEYS/ URINARY TRACT: Kidneys are orthotopic, demonstrating symmetric enhancement. 4 mm RIGHT upper pole, 3 mm RIGHT upper pole nephrolithiasis, 4 mm LEFT lower pole nephrolithiasis. No hydronephrosis or solid renal masses. The unopacified ureters are normal in course and caliber. Delayed imaging through the kidneys demonstrates symmetric prompt contrast  excretion within the proximal urinary collecting system. Urinary bladder is partially distended, 4 mm calculus dependent in RIGHT bladder.  PERITONEUM/RETROPERITONEUM: Aortoiliac vessels are normal in course and caliber, mild calcific atherosclerosis. No lymphadenopathy by CT size criteria. Prostate is enlarged, invading the base the urinary bladder. No intraperitoneal free fluid nor free air.  SOFT TISSUE/OSSEOUS STRUCTURES: Non-suspicious. Moderate fat containing LEFT inguinal hernia. Severe degenerative change of the lumbar spine, grade 1 L4-5 anterolisthesis with multi level severe neural foraminal narrowing, severe canal stenosis L4-5, moderate to severe L2-3 and L3-4.  Review of the MIP images confirms the above findings.  IMPRESSION: CTA CHEST:  Limited CTA of the chest, poor bolus timing.  Moderate to severe cardiomegaly with small pericardial effusion. Evidence of pulmonary edema with small pleural effusions and, patchy airspace opacities most consistent with confluent edema.  CT ABDOMEN AND PELVIS: Focal colonic hepatic flexure pneumatosis without  bowel perforation. Moderate to large amount of distal retained large bowel stool. Mild probable ileus, no bowel obstruction.  Bilateral nonobstructing nephrolithiasis measuring up to 4 mm.   Electronically Signed   By: Elon Alas M.D.   On: 06/18/2015 07:00   Ct Abdomen Pelvis W Contrast  06/18/2015   CLINICAL DATA:  Syncopal episode on toilet.  EXAM: CT ANGIOGRAPHY CHEST  CT ABDOMEN AND PELVIS WITH CONTRAST  TECHNIQUE: Multidetector CT imaging of the chest was performed using the standard protocol during bolus administration of intravenous contrast. Multiplanar CT image reconstructions and MIPs were obtained to evaluate the vascular anatomy. Multidetector CT imaging of the abdomen and pelvis was performed using the standard protocol during bolus administration of intravenous contrast.  CONTRAST:  182m OMNIPAQUE IOHEXOL 350 MG/ML SOLN  COMPARISON:  Chest radiograph June 18, 2015 at 2:24 a.m.  FINDINGS: CTA CHEST FINDINGS  Please note, due to the reported cracked tubing and back and arm, limited contrast administration, non angiographic phase.  Mediastinum: Heart size is moderate to severely enlarged, small pericardial effusion. Status post median sternotomy for CABG. Thoracic aorta is normal in course and caliber with moderate calcific atherosclerosis. Pulmonary vascular congestion.  Lungs: Small LEFT pleural effusion. Patchy ground-glass opacities LEFT lower lobe and, in the bilateral hila. Tracheobronchial tree is patent and midline, no pneumothorax.  Soft tissues and osseous structures: Large body habitus. No suspicious bony lesions or acute osseous process.  CT ABDOMEN and PELVIS FINDINGS (respiratory motion degraded examination)  SOLID ORGANS: The liver, spleen, gallbladder, pancreas and adrenal glands are unremarkable.  GASTROINTESTINAL TRACT: Moderate to large amount of distal retained large bowel stool. Gas distended colon up to 6.7 cm. Focal hepatic flexure pneumatosis. Scattered small bowel  air-fluid levels. The stomach, small bowel are normal in course and caliber without inflammatory changes. The appendix is not discretely identified, however there are no inflammatory changes in the right lower quadrant.  KIDNEYS/ URINARY TRACT: Kidneys are orthotopic, demonstrating symmetric enhancement. 4 mm RIGHT upper pole, 3 mm RIGHT upper pole nephrolithiasis, 4 mm LEFT lower pole nephrolithiasis. No hydronephrosis or solid renal masses. The unopacified ureters are normal in course and caliber. Delayed imaging through the kidneys demonstrates symmetric prompt contrast excretion within the proximal urinary collecting system. Urinary bladder is partially distended, 4 mm calculus dependent in RIGHT bladder.  PERITONEUM/RETROPERITONEUM: Aortoiliac vessels are normal in course and caliber, mild calcific atherosclerosis. No lymphadenopathy by CT size criteria. Prostate is enlarged, invading the base the urinary bladder. No intraperitoneal free fluid nor free air.  SOFT TISSUE/OSSEOUS STRUCTURES: Non-suspicious. Moderate fat containing LEFT inguinal hernia. Severe degenerative  change of the lumbar spine, grade 1 L4-5 anterolisthesis with multi level severe neural foraminal narrowing, severe canal stenosis L4-5, moderate to severe L2-3 and L3-4.  Review of the MIP images confirms the above findings.  IMPRESSION: CTA CHEST:  Limited CTA of the chest, poor bolus timing.  Moderate to severe cardiomegaly with small pericardial effusion. Evidence of pulmonary edema with small pleural effusions and, patchy airspace opacities most consistent with confluent edema.  CT ABDOMEN AND PELVIS: Focal colonic hepatic flexure pneumatosis without bowel perforation. Moderate to large amount of distal retained large bowel stool. Mild probable ileus, no bowel obstruction.  Bilateral nonobstructing nephrolithiasis measuring up to 4 mm.   Electronically Signed   By: Elon Alas M.D.   On: 06/18/2015 07:00   Dg Chest Port 1  View  06/18/2015   CLINICAL DATA:  Respiratory distress  EXAM: PORTABLE CHEST - 1 VIEW  COMPARISON:  06/12/2015  FINDINGS: There is marked cardiomegaly, unchanged. There are mild vascular and interstitial congestive changes, worsened from 06/12/2015. There is no large effusion. There is no dense airspace consolidation.  IMPRESSION: New/ worsened vascular and interstitial congestive changes. Marked unchanged cardiomegaly   Electronically Signed   By: Andreas Newport M.D.   On: 06/18/2015 02:34    ROS Blood pressure 84/83, pulse 98, resp. rate 22, SpO2 100 %. Physical Exam  Vitals reviewed. Constitutional: He is oriented to person, place, and time. He appears distressed (abdominal and back pain).  HENT:  Head: Normocephalic and atraumatic.  Eyes: Conjunctivae and EOM are normal. No scleral icterus.  Cardiovascular: Normal rate, regular rhythm and normal heart sounds.   No murmur heard. Respiratory:  Diminished BS in bases  GI:  Markedly distended, a few high pitched bowel sounds. Tender to palpation RUQ, no frank peritoneal signs  Musculoskeletal: He exhibits edema (trace).  Neurological: He is alert and oriented to person, place, and time.  No focal deficit  Skin: Skin is warm and dry.    Assessment/Plan: 69 yo man who had CABG about a month ago, complicated by atrial fibrillation and cellulitis. He presents after syncopal spell at home. He has been constipated and took laxative/ enema prior to event. He was profoundly hypotensive, acidotic and hypokalemic on arrival. CT shows a dilated colon and some pneumatosis at the hepatic flexure.  This could be ischemic in nature. C difficile colitis is also a possibility as he has been on clindamycin. He has not had diarrhea and has, in fact, been constipated, but that does not completely rule out the possibility. Clinda has been stopped.  He has been seen by General Surgery but does not have any indication for emergency surgery at this  point.  Melrose Nakayama 06/18/2015, 8:01 AM

## 2015-06-18 NOTE — Transfer of Care (Signed)
Immediate Anesthesia Transfer of Care Note  Patient: Jonathan Le  Procedure(s) Performed: Procedure(s): EXPLORATORY LAPAROTOMY (N/A)  Patient Location: SICU  Anesthesia Type:General  Level of Consciousness: sedated and unresponsive  Airway & Oxygen Therapy: Patient remains intubated per anesthesia plan and Patient placed on Ventilator (see vital sign flow sheet for setting)  Post-op Assessment: Report given to RN and Post -op Vital signs reviewed and stable  Post vital signs: Reviewed and stable  Last Vitals:  Filed Vitals:   06/18/15 1945  BP:   Pulse: 115  Temp:   Resp: 20    Complications: No apparent anesthesia complications

## 2015-06-18 NOTE — Anesthesia Postprocedure Evaluation (Signed)
  Anesthesia Post-op Note  Patient: Jonathan Le  Procedure(s) Performed: Procedure(s): EXPLORATORY LAPAROTOMY (N/A)  Patient Location: SICU  Anesthesia Type:General  Level of Consciousness: Sedated on vent  Airway and Oxygen Therapy: Patient remains intubated per anesthesia plan and Patient placed on Ventilator (see vital sign flow sheet for setting)  Post-op Pain: none  Post-op Assessment: Post-op Vital signs reviewed, PATIENT'S CARDIOVASCULAR STATUS UNSTABLE and Patent Airway              Post-op Vital Signs: Reviewed  Last Vitals:  Filed Vitals:   06/18/15 1900  BP: 98/57  Pulse: 96  Temp:   Resp: 20    Complications: No apparent anesthesia complications

## 2015-06-18 NOTE — Op Note (Signed)
Preoperative diagnosis: acute abdomen, ischemic colitis Postoperative diagnosis: ischemic colon Procedure: Total abdominal colectomy, abdominal vac placement Surgeon: Dr Harden Mo Asst: Dr Violeta Gelinas Anesthesia: general EBL: 50 cc Specimens: abdominal colon and omentum to pathology Sponge count correct (2 left in abdomen) Disposition to icu critical  Indications: This is a 2 yom who underwent CAB recently.  He has had some constipation postop as well as a lower extremity cellulitis treated with antibiotics.  He acutely became ill overnight with diarrhea and mostly back pain.  He had elevated lactate and ct scan that showed focal hepatic flexure pneumotosis.  His lactate initially improved with resuscitation.  Then it did not clear over time.  A c diff returned as negative.  He then coded.  He returned his vital signs quickly and we planned on going to or for exploration.  Procedure: After informed consent was obtained from the patients wife we proceeded to the OR. He was already on antibiotics.  SCDs were in place.  He was placed under general anesthesia. He was on significant amount of pressors.  His abdomen was prepped and draped in the standard sterile surgical fashion.  A timeout was performed.  I made a generous midline incision and entered the peritoneum without difficulty.  Immediately upon entering his transverse colon was noted to be gangrenous.  I noted some murky fluid.  I then rotated his right colon up after incising the white line. This was noted to be gangrenous but there was not any perforation. Eventually I realized the entire colon onto the rectum was gangrenous. I proceeded to divide the terminal ileum with a GIA stapler.  I then used a combination of the ligasure device and 2-0 silk sutures on the stay side to remove the right colon to the transverse colon The duodenum was identified and remained healthy.  I then separated the omentum from the transverse colon and traced  this to the splenic flexure which was released.  I then incised the white line and rotated the left colon up.  The mesentery was taken in a similar fashion with ties.  Eventually on the rectum I was able to encircle this with a Contour stapler and divide it. This was passed off the table. Hemostasis was observed. I irrigated copiously.  I then placed a sponge in the ruq and the pelvis where there was some oozing. I placed an abdominal vac.  He was transferred to icu intubated.

## 2015-06-18 NOTE — ED Notes (Signed)
Pt. had a brief syncopal episode while moving his bowels in the bathroom at home , pt. sitting on the commode at EMS arrival , pt. took some laxative and enema at midnight . Pt. somnolent at arrival .

## 2015-06-18 NOTE — Consult Note (Signed)
PCCM Attending Rounding Note: Patient reevaluated this morning. General surgery feel no indication for surgical intervention at this time. Patient remains in septic shock likely from an intra-abdominal source due to either ischemic or infectious colitis. Discussed the patient's critical status with both he, his wife, his sister, and his brother-in-law present. The patient consents to resuscitation status and his further considering endotracheal intubation if necessary. Given continued vasopressor requirement and need for IV medication infusion central access is being attempted at this time by nurse practitioner. I discussed the risks of the procedure with all present including bleeding, pneumothorax, and infection. The patient is willing to undergo the procedure with verbal consent at this time. I am starting broad-spectrum antibiotics with vancomycin, Elita Quick, & Flagyl. Planned repeat basic metabolic panel this afternoon to ensure electrolyte replacement. Continuing to trend lactic acid & Procalcitonin per algorithm. Blood cultures obtained and ED.  Donna Christen Jamison Neighbor, M.D. [facility] Pager:  [phone] After 3pm or if no response, call [phone]

## 2015-06-18 NOTE — Anesthesia Preprocedure Evaluation (Addendum)
Anesthesia Evaluation  Patient identified by MRN, date of birth, ID band Patient awake    Reviewed: Allergy & Precautions, NPO status , Patient's Chart, lab work & pertinent test results, reviewed documented beta blocker date and time   Airway Mallampati: II  TM Distance: >3 FB Neck ROM: Full    Dental no notable dental hx.    Pulmonary sleep apnea ,  + rhonchi         Cardiovascular hypertension, Pt. on home beta blockers and Pt. on medications + CAD, + Past MI and +CHF (- Left ventricle: The cavity size was mildly dilated. Wall) Rhythm:Regular Rate:Tachycardia     Neuro/Psych Anxiety negative neurological ROS  negative psych ROS   GI/Hepatic negative GI ROS, Neg liver ROS,   Endo/Other  Morbid obesity  Renal/GU negative Renal ROS     Musculoskeletal  (+) Arthritis -,   Abdominal (+) + obese,   Peds  Hematology negative hematology ROS (+)   Anesthesia Other Findings - Left ventricle: The cavity size was mildly dilated. Wall thickness was normal. Systolic function was severely reduced. The estimated ejection fraction was in the range of 25% to 30%. Severe hypokinesis of the anteroseptal and anterior myocardium. Dyskinesis of the apical myocardium. Doppler parameters are consistent with abnormal left ventricular relaxation (grade 1 diastolic dysfunction). No evidence of thrombus. Acoustic contrast opacification revealed no evidence ofthrombus. - Mitral valve: There was mild regurgitation. - Left atrium: The atrium was mildly dilated.  Reproductive/Obstetrics negative OB ROS                         Anesthesia Physical  Anesthesia Plan  ASA: V and emergent  Anesthesia Plan: General   Post-op Pain Management:    Induction: Intravenous  Airway Management Planned: Oral ETT  Additional Equipment: Arterial line  Intra-op Plan:   Post-operative Plan: Post-operative  intubation/ventilation  Informed Consent: I have reviewed the patients History and Physical, chart, labs and discussed the procedure including the risks, benefits and alternatives for the proposed anesthesia with the patient or authorized representative who has indicated his/her understanding and acceptance.   Dental advisory given  Plan Discussed with: CRNA  Anesthesia Plan Comments: (Patient acutely ill in unit, recent Echo shows reduced EF will likely start on Epi as he recently coded prior to coming to OR, was resuscitated with Epinephrine.. Is on NE and Phenylephrine, will likely switch to vasopressin if needed given indication in acute sepsis)      Anesthesia Quick Evaluation

## 2015-06-18 NOTE — Clinical Documentation Improvement (Signed)
Would you please help clarify the medical condition related to the clinical findings?  Sepsis from ischemic bowel. Severe Sepsis Septic Shock Other Condition  Cannot clinically Determine   Clinical findings -  Pt admitted with syncope from bowel movement.  Documented as pt is in shock from ischemic bowel.  On admission lactic acid 15.0 and repeat down to 8.4.  WBC started at 7.7 and has increased to 18.9.  HR = 101.  BP = 50/28.  Had 2500 cc of NS. Resp rate = 44.  Documented as shock from ischemic bowel.   Thank You, Harrie Jeans ,RN Clinical Documentation Specialist:    [facility]- Health Information Management

## 2015-06-18 NOTE — ED Provider Notes (Signed)
CSN: [id number]     Arrival date & time 06/18/15  0159 History  This chart was scribed for Gilda Crease, MD by Evon Slack, ED Scribe. This patient was seen in room [facility] and the patient's care was started at 2:05 AM.     Chief Complaint  Patient presents with  . Loss of Consciousness   Patient is a 69 y.o. male presenting with syncope. The history is provided by the EMS personnel. No language interpreter was used.  Loss of Consciousness Episode history:  Single Most recent episode:  Today Chronicity:  New Context: bowel movement   Relieved by:  None tried Worsened by:  Nothing tried Ineffective treatments:  None tried Associated symptoms: diaphoresis and shortness of breath    HPI Comments: Jonathan Le is a 69 y.o. male brought in by ambulance, who presents to the Emergency Department complaining of LOC onset tonight 1:30 PM. Per EMS pt was been constipated for several days. Pt has been taking laxatives with no relief. Per EMS pt had an enema tonight and went to use the rest room losing consciousness while sitting on the toilet. Per EMS pt has been pale, SOB and diaphoretic. 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 Pt is denying any pain at this time.   Past Medical History  Diagnosis Date  . Anxiety   . Hypertension   . Arthritis   . Sleep apnea     wears CPAP nightly  . Glaucoma    Past Surgical History  Procedure Laterality Date  . Hernia repair      right  . Eye surgery      bil cataract  . Wisdom tooth extraction    . Fasciotomy foot / toe Left     foot  . Cardiac catheterization N/A 05/20/2015    Procedure: Left Heart Cath and Coronary Angiography;  Surgeon: Kathleene Hazel, MD;  Location: [facility] INVASIVE CV LAB;  Service: Cardiovascular;  Laterality: N/A;  . Coronary artery bypass graft N/A 05/22/2015    Procedure: CORONARY ARTERY BYPASS GRAFTING (CABG), ON PUMP, TIMES THREE, USING LEFT INTERNAL MAMMARY ARTERY, RIGHT GREATER SAPHENOUS VEIN HARVESTED  ENDOSCOPICALLY;  Surgeon: Kerin Perna, MD;  Location: [facility] OR;  Service: Open Heart Surgery;  Laterality: N/A;  LIMA-LAD, SVG-OM, SVG-PD  . Tee without cardioversion N/A 05/22/2015    Procedure: TRANSESOPHAGEAL ECHOCARDIOGRAM (TEE);  Surgeon: Kerin Perna, MD;  Location: [facility] OR;  Service: Open Heart Surgery;  Laterality: N/A;  . Laparotomy N/A 06/18/2015    Procedure: EXPLORATORY LAPAROTOMY;  Surgeon: Emelia Loron, MD;  Location: [facility] OR;  Service: General;  Laterality: N/A;   No family history on file. History  Substance Use Topics  . Smoking status: Never Smoker   . Smokeless tobacco: Never Used  . Alcohol Use: No    Review of Systems  Constitutional: Positive for diaphoresis.  Respiratory: Positive for shortness of breath.   Cardiovascular: Positive for syncope.  Skin: Positive for pallor.  Neurological: Positive for syncope.  All other systems reviewed and are negative.     Allergies  Penicillins; Doxycycline; and Levaquin  Home Medications   Prior to Admission medications   Medication Sig Start Date End Date Taking? Authorizing Provider  acetaminophen (TYLENOL) 500 MG tablet Take 1,500 mg by mouth every 6 (six) hours as needed for headache.   Yes Historical Provider, MD  ALPRAZolam Prudy Feeler) 1 MG tablet Take 1 tablet (1 mg total) by mouth 2 (two) times daily. 05/31/15  Yes  Erin R Barrett, PA-C  amiodarone (PACERONE) 200 MG tablet Take 1 tablet (200 mg total) by mouth daily. 05/31/15  Yes Erin R Barrett, PA-C  aspirin EC 325 MG EC tablet Take 1 tablet (325 mg total) by mouth daily. 05/31/15  Yes Erin R Barrett, PA-C  atorvastatin (LIPITOR) 80 MG tablet Take 1 tablet (80 mg total) by mouth daily at 6 PM. 05/31/15  Yes Erin R Barrett, PA-C  clindamycin (CLEOCIN) 300 MG capsule Take 1 capsule (300 mg total) by mouth 3 (three) times daily. 06/13/15  Yes Loreli Slot, MD  finasteride (PROSCAR) 5 MG tablet Take 5 mg by mouth daily.   Yes Historical Provider, MD  furosemide  (LASIX) 40 MG tablet TAKE 1 TABLET BY MOUTH DAILY 06/15/15  Yes Alleen Borne, MD  lactulose, encephalopathy, (CHRONULAC) 10 GM/15ML SOLN Take 20 g by mouth daily as needed (for constipation).   Yes Historical Provider, MD  lisinopril (PRINIVIL,ZESTRIL) 5 MG tablet Take 1 tablet (5 mg total) by mouth daily. 05/31/15  Yes Erin R Barrett, PA-C  metolazone (ZAROXOLYN) 5 MG tablet TAKE 1 TABLET BY MOUTH EVERY DAY 06/15/15  Yes Alleen Borne, MD  potassium chloride SA (K-DUR,KLOR-CON) 20 MEQ tablet TAKE 1 TABLET BY MOUTH DAILY 06/15/15  Yes Alleen Borne, MD  sodium phosphate (FLEET) enema Place 1 enema rectally once. follow package directions   Yes Historical Provider, MD   BP 98/61 mmHg  Pulse 66  Temp(Src) 98.9 F (37.2 C) (Oral)  Resp 20  Ht 5\' 9"  (1.753 m)  Wt 295 lb 6.7 oz (134 kg)  BMI 43.61 kg/m2  SpO2 97%   Physical Exam  Constitutional: He is oriented to person, place, and time. He appears well-developed and well-nourished. He appears distressed.  Grey appearing and diaphoretic.   HENT:  Head: Normocephalic and atraumatic.  Right Ear: Hearing normal.  Left Ear: Hearing normal.  Nose: Nose normal.  Mouth/Throat: Oropharynx is clear and moist and mucous membranes are normal.  Eyes: Conjunctivae and EOM are normal. Pupils are equal, round, and reactive to light.  Neck: Normal range of motion. Neck supple.  Cardiovascular: Regular rhythm, S1 normal and S2 normal.  Tachycardia present.  Exam reveals no gallop and no friction rub.   No murmur heard. Pulmonary/Chest: Effort normal and breath sounds normal. Tachypnea noted. No respiratory distress. He exhibits no tenderness.  Abdominal: Soft. Normal appearance and bowel sounds are normal. There is no hepatosplenomegaly. There is no tenderness. There is no rebound, no guarding, no tenderness at McBurney's point and negative Murphy's sign. No hernia.  Musculoskeletal: Normal range of motion.  Neurological: He is alert and oriented to person,  place, and time. He has normal strength. No cranial nerve deficit or sensory deficit. Coordination normal. GCS eye subscore is 4. GCS verbal subscore is 5. GCS motor subscore is 6.  Skin: Skin is warm and intact. No rash noted. He is diaphoretic. No cyanosis.  Psychiatric: He has a normal mood and affect. His speech is normal and behavior is normal. Thought content normal.  Nursing note and vitals reviewed.   ED Course  Procedures (including critical care time) DIAGNOSTIC STUDIES: Oxygen Saturation is 100% on 15L, normal by my interpretation.    COORDINATION OF CARE: 3:27 AM-Discussed treatment plan with pt at bedside and pt agreed to plan.     Labs Review Labs Reviewed  COMPREHENSIVE METABOLIC PANEL - Abnormal; Notable for the following:    Sodium 132 (*)    Potassium <2.0 (*)  Chloride 89 (*)    CO2 13 (*)    Glucose, Bld 251 (*)    Total Protein 6.3 (*)    Albumin 3.1 (*)    AST 47 (*)    GFR calc non Af Amer 59 (*)    Anion gap 30 (*)    All other components within normal limits  TROPONIN I - Abnormal; Notable for the following:    Troponin I 0.04 (*)    All other components within normal limits  BRAIN NATRIURETIC PEPTIDE - Abnormal; Notable for the following:    B Natriuretic Peptide 105.1 (*)    All other components within normal limits  LACTIC ACID, PLASMA - Abnormal; Notable for the following:    Lactic Acid, Venous 15.0 (*)    All other components within normal limits  LACTIC ACID, PLASMA - Abnormal; Notable for the following:    Lactic Acid, Venous 8.3 (*)    All other components within normal limits  PHOSPHORUS - Abnormal; Notable for the following:    Phosphorus 5.7 (*)    All other components within normal limits  LACTIC ACID, PLASMA - Abnormal; Notable for the following:    Lactic Acid, Venous 8.4 (*)    All other components within normal limits  CBC - Abnormal; Notable for the following:    WBC 18.9 (*)    All other components within normal limits   PROTIME-INR - Abnormal; Notable for the following:    Prothrombin Time 20.6 (*)    INR 1.78 (*)    All other components within normal limits  BASIC METABOLIC PANEL - Abnormal; Notable for the following:    Potassium 2.1 (*)    Chloride 97 (*)    CO2 18 (*)    Glucose, Bld 145 (*)    Creatinine, Ser 1.66 (*)    GFR calc non Af Amer 40 (*)    GFR calc Af Amer 47 (*)    Anion gap 20 (*)    All other components within normal limits  GLUCOSE, CAPILLARY - Abnormal; Notable for the following:    Glucose-Capillary 143 (*)    All other components within normal limits  GLUCOSE, CAPILLARY - Abnormal; Notable for the following:    Glucose-Capillary 151 (*)    All other components within normal limits  BASIC METABOLIC PANEL - Abnormal; Notable for the following:    Potassium 2.8 (*)    Chloride 97 (*)    CO2 15 (*)    Glucose, Bld 140 (*)    BUN 23 (*)    Creatinine, Ser 1.82 (*)    Calcium 8.3 (*)    GFR calc non Af Amer 36 (*)    GFR calc Af Amer 42 (*)    Anion gap 23 (*)    All other components within normal limits  LACTIC ACID, PLASMA - Abnormal; Notable for the following:    Lactic Acid, Venous 8.7 (*)    All other components within normal limits  PROTIME-INR - Abnormal; Notable for the following:    Prothrombin Time 20.2 (*)    INR 1.72 (*)    All other components within normal limits  CBC - Abnormal; Notable for the following:    WBC 11.7 (*)    All other components within normal limits  MAGNESIUM - Abnormal; Notable for the following:    Magnesium 1.6 (*)    All other components within normal limits  PHOSPHORUS - Abnormal; Notable for the following:    Phosphorus  4.8 (*)    All other components within normal limits  COMPREHENSIVE METABOLIC PANEL - Abnormal; Notable for the following:    Potassium 3.0 (*)    Glucose, Bld 117 (*)    BUN 21 (*)    Calcium 7.8 (*)    Total Protein 4.7 (*)    Albumin 2.2 (*)    AST 82 (*)    Total Bilirubin 1.3 (*)    All other  components within normal limits  PROTIME-INR - Abnormal; Notable for the following:    Prothrombin Time 18.3 (*)    INR 1.51 (*)    All other components within normal limits  CBC WITH DIFFERENTIAL/PLATELET - Abnormal; Notable for the following:    Neutrophils Relative % 83 (*)    Lymphocytes Relative 10 (*)    All other components within normal limits  BASIC METABOLIC PANEL - Abnormal; Notable for the following:    Potassium 2.5 (*)    Chloride 99 (*)    CO2 20 (*)    Glucose, Bld 177 (*)    BUN 22 (*)    Creatinine, Ser 1.43 (*)    Calcium 7.9 (*)    GFR calc non Af Amer 48 (*)    GFR calc Af Amer 56 (*)    Anion gap 16 (*)    All other components within normal limits  PROTIME-INR - Abnormal; Notable for the following:    Prothrombin Time 17.1 (*)    All other components within normal limits  GLUCOSE, CAPILLARY - Abnormal; Notable for the following:    Glucose-Capillary 160 (*)    All other components within normal limits  GLUCOSE, CAPILLARY - Abnormal; Notable for the following:    Glucose-Capillary 112 (*)    All other components within normal limits  GLUCOSE, CAPILLARY - Abnormal; Notable for the following:    Glucose-Capillary 109 (*)    All other components within normal limits  LACTIC ACID, PLASMA - Abnormal; Notable for the following:    Lactic Acid, Venous 2.9 (*)    All other components within normal limits  GLUCOSE, CAPILLARY - Abnormal; Notable for the following:    Glucose-Capillary 108 (*)    All other components within normal limits  BASIC METABOLIC PANEL - Abnormal; Notable for the following:    Potassium 3.4 (*)    Glucose, Bld 130 (*)    Calcium 7.8 (*)    All other components within normal limits  TROPONIN I - Abnormal; Notable for the following:    Troponin I 0.11 (*)    All other components within normal limits  LACTIC ACID, PLASMA - Abnormal; Notable for the following:    Lactic Acid, Venous 2.4 (*)    All other components within normal limits   GLUCOSE, CAPILLARY - Abnormal; Notable for the following:    Glucose-Capillary 121 (*)    All other components within normal limits  PROTIME-INR - Abnormal; Notable for the following:    Prothrombin Time 17.5 (*)    All other components within normal limits  CBC WITH DIFFERENTIAL/PLATELET - Abnormal; Notable for the following:    Hemoglobin 12.1 (*)    HCT 36.3 (*)    Neutrophils Relative % 80 (*)    All other components within normal limits  LACTIC ACID, PLASMA - Abnormal; Notable for the following:    Lactic Acid, Venous 2.4 (*)    All other components within normal limits  TROPONIN I - Abnormal; Notable for the following:    Troponin  I 0.10 (*)    All other components within normal limits  PHOSPHORUS - Abnormal; Notable for the following:    Phosphorus 1.9 (*)    All other components within normal limits  PREALBUMIN - Abnormal; Notable for the following:    Prealbumin 5.2 (*)    All other components within normal limits  GLUCOSE, CAPILLARY - Abnormal; Notable for the following:    Glucose-Capillary 136 (*)    All other components within normal limits  GLUCOSE, CAPILLARY - Abnormal; Notable for the following:    Glucose-Capillary 141 (*)    All other components within normal limits  GLUCOSE, CAPILLARY - Abnormal; Notable for the following:    Glucose-Capillary 154 (*)    All other components within normal limits  GLUCOSE, CAPILLARY - Abnormal; Notable for the following:    Glucose-Capillary 182 (*)    All other components within normal limits  COMPREHENSIVE METABOLIC PANEL - Abnormal; Notable for the following:    Potassium 3.1 (*)    Glucose, Bld 173 (*)    Calcium 7.7 (*)    Total Protein 4.6 (*)    Albumin 1.8 (*)    AST 74 (*)    All other components within normal limits  POCT I-STAT 7, (LYTES, BLD GAS, ICA,H+H) - Abnormal; Notable for the following:    pH, Arterial 7.088 (*)    pCO2 arterial 60.8 (*)    Bicarbonate 18.3 (*)    Acid-base deficit 12.0 (*)     Potassium 2.9 (*)    All other components within normal limits  POCT I-STAT 7, (LYTES, BLD GAS, ICA,H+H) - Abnormal; Notable for the following:    pH, Arterial 7.210 (*)    pCO2 arterial 50.2 (*)    Acid-base deficit 8.0 (*)    Potassium 2.9 (*)    Calcium, Ion 1.08 (*)    HCT 38.0 (*)    Hemoglobin 12.9 (*)    All other components within normal limits  POCT I-STAT 3, ART BLOOD GAS (G3+) - Abnormal; Notable for the following:    pH, Arterial 7.491 (*)    pCO2 arterial 31.3 (*)    pO2, Arterial 132.0 (*)    All other components within normal limits  POCT I-STAT 3, ART BLOOD GAS (G3+) - Abnormal; Notable for the following:    pO2, Arterial 123.0 (*)    Acid-base deficit 3.0 (*)    All other components within normal limits  CULTURE, BLOOD (ROUTINE X 2)  CULTURE, BLOOD (ROUTINE X 2)  C DIFFICILE QUICK SCAN W PCR REFLEX  CBC WITH DIFFERENTIAL/PLATELET  BETA-HYDROXYBUTYRIC ACID  MAGNESIUM  PROCALCITONIN  CORTISOL  PROCALCITONIN  PROCALCITONIN  APTT  MAGNESIUM  PROCALCITONIN  APTT  MAGNESIUM  TRIGLYCERIDES  CBC  I-STAT TROPOININ, ED  TYPE AND SCREEN  PREPARE FRESH FROZEN PLASMA  BLOOD PRODUCT ORDER (VERBAL) VERIFICATION  SURGICAL PATHOLOGY    Imaging Review Dg Chest Port 1 View  06/20/2015   CLINICAL DATA:  69 year old male with respiratory failure.  EXAM: PORTABLE CHEST - 1 VIEW  COMPARISON:  Chest x-ray 06/19/2015.  FINDINGS: An endotracheal tube is in place with tip 3.6 cm above the carina. A nasogastric tube is seen extending into the stomach, however, the tip of the nasogastric tube extends below the lower margin of the image. Lung volumes remain low. Persistent bibasilar opacities may reflect areas of atelectasis and/or consolidation. Left costophrenic sulcus is excluded. No definite right pleural effusion. Pulmonary venous congestion accentuated by low lung volumes, without frank pulmonary  edema. Mild cardiomegaly is unchanged. Mediastinal contours are distorted by  patient positioning. Atherosclerosis in the thoracic aorta. Status post median sternotomy for CABG.  IMPRESSION: 1. Low lung volumes with bibasilar areas of atelectasis and/or consolidation. 2. Postoperative changes and support apparatus, as above.   Electronically Signed   By: Trudie Reed M.D.   On: 06/20/2015 07:09   Dg Chest Port 1 View  06/19/2015   CLINICAL DATA:  Hypoxia  EXAM: PORTABLE CHEST - 1 VIEW  COMPARISON:  June 18, 2015  FINDINGS: Endotracheal tube tip is 1.7 cm above the carina. The left subclavian catheter tip is at the junction of the left innominate vein and superior vena cava, stable. Nasogastric tube tip and side port are below the diaphragm. No pneumothorax.  There is mild bibasilar atelectatic change. There is no frank edema or consolidation. Heart is mildly enlarged with pulmonary vascularity within normal limits. No adenopathy. Patient is status post coronary artery bypass grafting.  IMPRESSION: Tube and catheter positions as described without pneumothorax. Mild bibasilar atelectasis. No frank edema or consolidation. Heart prominent but stable.   Electronically Signed   By: Bretta Bang III M.D.   On: 06/19/2015 07:24   Dg Chest Port 1 View  06/18/2015   CLINICAL DATA:  Endotracheal tube placement.  Initial encounter.  EXAM: PORTABLE CHEST - 1 VIEW  COMPARISON:  Chest radiograph performed earlier today at 10:30 a.m.  FINDINGS: The patient's endotracheal tube is seen ending 1-2 cm above the carina. This could be retracted 1-2 cm. The enteric tube is seen extending below the diaphragm. A left IJ line is noted ending about the proximal to mid SVC.  The lungs are relatively well expanded. Mild vascular congestion is noted. Mild bibasilar opacities likely reflect atelectasis, though mild interstitial edema could have a similar appearance. No pleural effusion or pneumothorax is seen.  The cardiomediastinal silhouette is enlarged. The patient is status post median sternotomy. An  external pacing pad is noted. No acute osseous abnormalities are identified.  IMPRESSION: 1. Endotracheal tube seen ending 1-2 cm above the carina. This could be retracted 1-2 cm. 2. Mild vascular congestion and cardiomegaly noted. Mild bibasilar opacities likely reflect atelectasis, though mild interstitial edema could have a similar appearance.   Electronically Signed   By: Roanna Raider M.D.   On: 06/18/2015 19:23     EKG Interpretation   Date/Time:  Thursday June 18 2015 02:06:24 EDT Ventricular Rate:  106 PR Interval:  153 QRS Duration: 138 QT Interval:  386 QTC Calculation: 513 R Axis:   -62 Text Interpretation:  Sinus tachycardia Left atrial enlargement Left  bundle branch block (progression from non-specific IVCD on last EKG)  Confirmed by POLLINA  MD, CHRISTOPHER ([id number]) on 06/18/2015 3:28:03 AM      MDM   Final diagnoses:  Shortness of breath  Syncope  Abdominal pain, generalized   Patient presents to the ER for evaluation of syncopal episode. Patient was at home when the syncope occurred. Patient reportedly has been feeling constipated for the last couple of days. He took an enema and then when he was on the toilet trying to have a bowel movement, he passed out. He did not fall or injure himself.  Upon arrival to the ER, patient appeared very distressed. He appeared to be short of breath with tachypnea, but denies shortness of breath. He denied chest pain and there is no abdominal discomfort at arrival.  Patient was noted to have severe orthopnea upon arrival. X-rays did show  some mild interstitial changes, thought to be possibly edema, although BNP was not elevated.  Patient was initially hypertensive, but became hypotensive in the ER. He was initially supported with IV fluids. At this time he started to have abdominal discomfort and back pain. Critical care was consulted to help manage the patient. He was initiated on additional IV fluids and Levophed for blood pressure  support. This was administered to facilitate CAT scans. Patient had CT head because of acute mental status changes. He also underwent CT angiogram of chest to evaluate for possible PE causing the hypotension and syncope. Additionally, with the patient beginning to complain of abdominal pain, distention and back pain, CT abdomen and pelvis was performed. Ultimately CT abdomen and pelvis did show pneumatosis at the hepatic flexure. Patient admitted to the ICU. Critical care for further management.  CRITICAL CARE Performed by: Gilda Crease   Total critical care time:  Critical care time was exclusive of separately billable procedures and treating other patients.  Critical care was necessary to treat or prevent imminent or life-threatening deterioration.  Critical care was time spent personally by me on the following activities: development of treatment plan with patient and/or surrogate as well as nursing, discussions with consultants, evaluation of patient's response to treatment, examination of patient, obtaining history from patient or surrogate, ordering and performing treatments and interventions, ordering and review of laboratory studies, ordering and review of radiographic studies, pulse oximetry and re-evaluation of patient's condition.    I personally performed the services described in this documentation, which was scribed in my presence. The recorded information has been reviewed and is accurate.       Gilda Crease, MD 06/20/15 [phone]

## 2015-06-18 NOTE — Progress Notes (Signed)
Late Entry: 2 RNs bedside to change incontinence pad after pt had stooled (not abnormal finding for pt today, he has had multiple stools). While HOB was brought back up pt c/o not being able to breath. O2 sats dropped from 92% to unreadable and pt turned blue gasping for air. Code called, pulse absent, compressions started. Dr Tana Felts bedside immediately followed by Dr Celene Skeen and Dr Dwain Sarna. See code documentation. Wife, Jonathan Le was called she is on her way to the room, Surgery MD consulted over the phone. Pt taken to surgery as soon as wife got to bedside.

## 2015-06-18 NOTE — Anesthesia Procedure Notes (Signed)
Procedure Name: Intubation Date/Time: 06/18/2015 5:15 PM Performed by: Edmonia Caprio Pre-anesthesia Checklist: Patient identified, Emergency Drugs available, Suction available and Patient being monitored Patient Re-evaluated:Patient Re-evaluated prior to inductionOxygen Delivery Method: Circle system utilized Preoxygenation: Pre-oxygenation with 100% oxygen Intubation Type: IV induction and Rapid sequence Laryngoscope Size: Mac and 4 Grade View: Grade II Tube type: Oral Tube size: 7.5 mm Number of attempts: 1 Airway Equipment and Method: Stylet Placement Confirmation: ETT inserted through vocal cords under direct vision,  positive ETCO2 and breath sounds checked- equal and bilateral Secured at: 24 cm Tube secured with: Tape Dental Injury: Teeth and Oropharynx as per pre-operative assessment

## 2015-06-18 NOTE — Brief Op Note (Signed)
06/18/2015  6:31 PM  PATIENT:  [name]  69 y.o. male  PRE-OPERATIVE DIAGNOSIS:  illeus  POST-OPERATIVE DIAGNOSIS:  Ischemic Colon  PROCEDURE:  Procedure(s): EXPLORATORY LAPAROTOMY (N/A)total abdominal colectomy with abdominal vac placement  SURGEON:  Surgeon(s) and Role:    * Emelia Loron, MD - Primary    * Violeta Gelinas, MD - Assisting  PHYSICIAN ASSISTANT:   ASSISTANTS: Dr Violeta Gelinas   ANESTHESIA:   general  EBL:  Total I/O In: 4817 [I.V.:429; Blood:1138; IV Piggyback:3250] Out: 690 [Urine:690]  BLOOD ADMINISTERED:4 FFP  DRAINS: Nasogastric Tube and Urinary Catheter (Foley)   LOCAL MEDICATIONS USED:  NONE  SPECIMEN:  Source of Specimen:  abdominal colon  DISPOSITION OF SPECIMEN:  PATHOLOGY  COUNTS:  YES  TOURNIQUET:  * No tourniquets in log *  DICTATION: .Dragon Dictation  PLAN OF CARE: icu intubated  PATIENT DISPOSITION:  ICU - intubated and critically ill.   Delay start of Pharmacological VTE agent (>24hrs) due to surgical blood loss or risk of bleeding: no

## 2015-06-19 ENCOUNTER — Encounter (HOSPITAL_COMMUNITY): Payer: Self-pay | Admitting: *Deleted

## 2015-06-19 ENCOUNTER — Inpatient Hospital Stay (HOSPITAL_COMMUNITY): Payer: Medicare Other

## 2015-06-19 LAB — PREPARE FRESH FROZEN PLASMA
UNIT DIVISION: 0
UNIT DIVISION: 0
UNIT DIVISION: 0
UNIT DIVISION: 0
Unit division: 0
Unit division: 0
Unit division: 0
Unit division: 0

## 2015-06-19 LAB — POCT I-STAT 3, ART BLOOD GAS (G3+)
ACID-BASE EXCESS: 1 mmol/L (ref 0.0–2.0)
Bicarbonate: 23.9 mEq/L (ref 20.0–24.0)
O2 Saturation: 99 %
PCO2 ART: 31.3 mmHg — AB (ref 35.0–45.0)
PH ART: 7.491 — AB (ref 7.350–7.450)
Patient temperature: 98
TCO2: 25 mmol/L (ref 0–100)
pO2, Arterial: 132 mmHg — ABNORMAL HIGH (ref 80.0–100.0)

## 2015-06-19 LAB — PROTIME-INR
INR: 1.51 — ABNORMAL HIGH (ref 0.00–1.49)
Prothrombin Time: 18.3 seconds — ABNORMAL HIGH (ref 11.6–15.2)

## 2015-06-19 LAB — COMPREHENSIVE METABOLIC PANEL
ALT: 50 U/L (ref 17–63)
ANION GAP: 11 (ref 5–15)
AST: 82 U/L — AB (ref 15–41)
Albumin: 2.2 g/dL — ABNORMAL LOW (ref 3.5–5.0)
Alkaline Phosphatase: 117 U/L (ref 38–126)
BUN: 21 mg/dL — ABNORMAL HIGH (ref 6–20)
CALCIUM: 7.8 mg/dL — AB (ref 8.9–10.3)
CHLORIDE: 103 mmol/L (ref 101–111)
CO2: 22 mmol/L (ref 22–32)
Creatinine, Ser: 1.03 mg/dL (ref 0.61–1.24)
GFR calc non Af Amer: >60 mL/min (ref >60)
Glucose, Bld: 117 mg/dL — ABNORMAL HIGH (ref 65–99)
POTASSIUM: 3 mmol/L — AB (ref 3.5–5.1)
Sodium: 136 mmol/L (ref 135–145)
Total Bilirubin: 1.3 mg/dL — ABNORMAL HIGH (ref 0.3–1.2)
Total Protein: 4.7 g/dL — ABNORMAL LOW (ref 6.5–8.1)

## 2015-06-19 LAB — CBC
HCT: 40.5 % (ref 39.0–52.0)
HEMOGLOBIN: 14.3 g/dL (ref 13.0–17.0)
MCH: 29.1 pg (ref 26.0–34.0)
MCHC: 35.3 g/dL (ref 30.0–36.0)
MCV: 82.5 fL (ref 78.0–100.0)
PLATELETS: 242 10*3/uL (ref 150–400)
RBC: 4.91 MIL/uL (ref 4.22–5.81)
RDW: 14.5 % (ref 11.5–15.5)
WBC: 11.7 10*3/uL — ABNORMAL HIGH (ref 4.0–10.5)

## 2015-06-19 LAB — GLUCOSE, CAPILLARY
GLUCOSE-CAPILLARY: 109 mg/dL — AB (ref 65–99)
GLUCOSE-CAPILLARY: 108 mg/dL — AB (ref 65–99)
GLUCOSE-CAPILLARY: 121 mg/dL — AB (ref 65–99)
GLUCOSE-CAPILLARY: 136 mg/dL — AB (ref 65–99)
Glucose-Capillary: 141 mg/dL — ABNORMAL HIGH (ref 65–99)

## 2015-06-19 LAB — LACTIC ACID, PLASMA
LACTIC ACID, VENOUS: 2.4 mmol/L — AB (ref 0.5–2.0)
Lactic Acid, Venous: 2.9 mmol/L (ref 0.5–2.0)

## 2015-06-19 LAB — BASIC METABOLIC PANEL
Anion gap: 10 (ref 5–15)
BUN: 19 mg/dL (ref 6–20)
CALCIUM: 7.8 mg/dL — AB (ref 8.9–10.3)
CO2: 23 mmol/L (ref 22–32)
CREATININE: 0.89 mg/dL (ref 0.61–1.24)
Chloride: 104 mmol/L (ref 101–111)
GFR calc non Af Amer: >60 mL/min (ref >60)
Glucose, Bld: 130 mg/dL — ABNORMAL HIGH (ref 65–99)
POTASSIUM: 3.4 mmol/L — AB (ref 3.5–5.1)
Sodium: 137 mmol/L (ref 135–145)

## 2015-06-19 LAB — MAGNESIUM: Magnesium: 1.6 mg/dL — ABNORMAL LOW (ref 1.7–2.4)

## 2015-06-19 LAB — PROCALCITONIN: Procalcitonin: 32.23 ng/mL

## 2015-06-19 LAB — BLOOD PRODUCT ORDER (VERBAL) VERIFICATION

## 2015-06-19 LAB — TROPONIN I: Troponin I: 0.11 ng/mL — ABNORMAL HIGH (ref <0.031)

## 2015-06-19 LAB — APTT: APTT: 34 s (ref 24–37)

## 2015-06-19 LAB — PHOSPHORUS: Phosphorus: 4.8 mg/dL — ABNORMAL HIGH (ref 2.5–4.6)

## 2015-06-19 MED ORDER — SODIUM CHLORIDE 0.9 % IV BOLUS (SEPSIS): 500.0000 mL | Freq: Once | INTRAVENOUS | Status: DC

## 2015-06-19 MED ORDER — ANTISEPTIC ORAL RINSE SOLUTION (CORINZ)
7.0000 mL | Freq: Four times a day (QID) | OROMUCOSAL | Status: DC
  Administered 2015-06-19 – 2015-07-02 (×49): 7 mL via OROMUCOSAL

## 2015-06-19 MED ORDER — KCL IN DEXTROSE-NACL 20-5-0.9 MEQ/L-%-% IV SOLN
INTRAVENOUS | Status: DC
  Administered 2015-06-19 – 2015-06-22 (×7): via INTRAVENOUS
  Filled 2015-06-19 (×8): qty 1000

## 2015-06-19 MED ORDER — VANCOMYCIN HCL IN DEXTROSE 1-5 GM/200ML-% IV SOLN
1000.0000 mg | Freq: Two times a day (BID) | INTRAVENOUS | Status: DC
  Administered 2015-06-20: 1000 mg via INTRAVENOUS
  Filled 2015-06-19 (×2): qty 200

## 2015-06-19 MED ORDER — CHLORHEXIDINE GLUCONATE 0.12 % MT SOLN
OROMUCOSAL | Status: AC
  Administered 2015-06-19: 15 mL
  Filled 2015-06-19: qty 15

## 2015-06-19 MED ORDER — TRACE MINERALS CR-CU-MN-SE-ZN 10-1000-500-60 MCG/ML IV SOLN
INTRAVENOUS | Status: AC
  Administered 2015-06-19: 18:00:00 via INTRAVENOUS
  Filled 2015-06-19: qty 960

## 2015-06-19 MED ORDER — SODIUM CHLORIDE 0.9 % IV SOLN: INTRAVENOUS | Status: DC

## 2015-06-19 MED ORDER — POTASSIUM CHLORIDE 10 MEQ/50ML IV SOLN
10.0000 meq | INTRAVENOUS | Status: AC
  Administered 2015-06-19 (×4): 10 meq via INTRAVENOUS
  Filled 2015-06-19 (×4): qty 50

## 2015-06-19 MED ORDER — MAGNESIUM SULFATE 2 GM/50ML IV SOLN
2.0000 g | Freq: Once | INTRAVENOUS | Status: AC
  Administered 2015-06-19: 2 g via INTRAVENOUS
  Filled 2015-06-19: qty 50

## 2015-06-19 MED ORDER — CHLORHEXIDINE GLUCONATE 0.12% ORAL RINSE (MEDLINE KIT)
15.0000 mL | Freq: Two times a day (BID) | OROMUCOSAL | Status: DC
  Administered 2015-06-19 – 2015-07-02 (×25): 15 mL via OROMUCOSAL

## 2015-06-19 MED ORDER — POTASSIUM CHLORIDE 10 MEQ/50ML IV SOLN
10.0000 meq | INTRAVENOUS | Status: AC
  Administered 2015-06-19 (×2): 10 meq via INTRAVENOUS
  Filled 2015-06-19 (×2): qty 50

## 2015-06-19 NOTE — Progress Notes (Addendum)
PULMONARY / CRITICAL CARE MEDICINE   Name: Jonathan Le MRN: [id number] DOB: 04-27-1946    ADMISSION DATE:  06/18/2015 CONSULTATION DATE: 06/18/15   CHIEF COMPLAINT:  Septic shock secondary to ischemic colon. Acute respiratiory failure. Acute renal failure.  INITIAL PRESENTATION: 69 year old male with PMH as below, which is significant for OSA and HTN. 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 He progressed and was discharged. Post discharge course complicated by R leg cellulitis after endoscopic vein harvest he was seen by Dr. Donata Clay in clinic for this 8/3 and was felt to be improving. No complaints noted at that time. Was in his USOH, but was having some constipation. 8/4 PM he took an enema and was straining in bathroom. He became diaphoretic and had loss of consciousness. Wife called EMS. In ED he was noted to be profoundly hypotensive with SOB. Found to be in severe septic shock with ischemic colon s/p total abd colectomy on 8/4  STUDIES:  Echo (8/4) - Left ventricle: The cavity size was mildly dilated. Wall thickness was normal. Systolic function was severely reduced. The estimated ejection fraction was in the range of 25% to 30%. Severe hypokinesis of the anteroseptal and anterior myocardium. Dyskinesis of the apical myocardium. Doppler parameters are consistent with abnormal left ventricular relaxation (grade 1 diastolic dysfunction). No evidence of thrombus. Acoustic contrast opacification revealed no evidence ofthrombus. - Mitral valve: There was mild regurgitation. - Left atrium: The atrium was mildly dilated.  CT chest abd pelvis (8/4) CTA CHEST: Limited CTA of the chest, poor bolus timing.  Moderate to severe cardiomegaly  with small pericardial effusion. Evidence of pulmonary edema with small pleural effusions and, patchy airspace opacities most consistent with confluent edema.  CT ABDOMEN AND PELVIS: Focal colonic hepatic flexure pneumatosis without bowel perforation. Moderate to large amount of distal retained large bowel stool. Mild probable ileus, no bowel obstruction.  Bilateral nonobstructing nephrolithiasis measuring up to 4 mm.  SIGNIFICANT EVENTS: 8/4- Total abdominal colectomy with abdominal vac placement   PAST MEDICAL HISTORY :   has a past medical history of Anxiety; Hypertension; Arthritis; Sleep apnea; and Glaucoma.  has past surgical history that includes Hernia repair; Eye surgery; Wisdom tooth extraction; Fasciotomy foot / toe (Left); Cardiac catheterization (N/A, 05/20/2015); Coronary artery bypass graft (N/A, 05/22/2015); and TEE without cardioversion (N/A, 05/22/2015). Prior to Admission medications   Medication Sig Start Date End Date Taking? Authorizing Provider  acetaminophen (TYLENOL) 500 MG tablet Take 1,500 mg by mouth every 6 (six) hours as needed for headache.   Yes Historical Provider, MD  ALPRAZolam Prudy Feeler) 1 MG tablet Take 1 tablet (1 mg total) by mouth 2 (two) times daily. 05/31/15  Yes Erin R Barrett, PA-C  amiodarone (PACERONE) 200 MG tablet Take 1 tablet (200 mg total) by mouth daily. 05/31/15  Yes Erin R Barrett, PA-C  aspirin EC 325 MG EC tablet Take 1 tablet (325 mg total) by mouth daily. 05/31/15  Yes Erin R Barrett, PA-C  atorvastatin (LIPITOR) 80 MG tablet Take 1 tablet (80 mg total) by mouth daily at 6 PM. 05/31/15  Yes Erin R Barrett, PA-C  clindamycin (CLEOCIN) 300 MG capsule Take 1 capsule (300 mg total) by mouth 3 (three) times daily. 06/13/15  Yes Viviann Spare  Lars Pinks, MD  finasteride (PROSCAR) 5 MG tablet Take 5 mg by mouth daily.   Yes Historical Provider, MD  furosemide (LASIX) 40 MG tablet TAKE 1 TABLET BY MOUTH DAILY 06/15/15  Yes Alleen Borne, MD  lactulose,  encephalopathy, (CHRONULAC) 10 GM/15ML SOLN Take 20 g by mouth daily as needed (for constipation).   Yes Historical Provider, MD  lisinopril (PRINIVIL,ZESTRIL) 5 MG tablet Take 1 tablet (5 mg total) by mouth daily. 05/31/15  Yes Erin R Barrett, PA-C  metolazone (ZAROXOLYN) 5 MG tablet TAKE 1 TABLET BY MOUTH EVERY DAY 06/15/15  Yes Alleen Borne, MD  potassium chloride SA (K-DUR,KLOR-CON) 20 MEQ tablet TAKE 1 TABLET BY MOUTH DAILY 06/15/15  Yes Alleen Borne, MD  sodium phosphate (FLEET) enema Place 1 enema rectally once. follow package directions   Yes Historical Provider, MD   Allergies  Allergen Reactions  . Penicillins Hives  . Doxycycline Rash  . Levaquin [Levofloxacin In D5w] Nausea Only and Other (See Comments)    Dizziness     FAMILY HISTORY:  indicated that his mother is deceased. He indicated that his father is deceased.  SOCIAL HISTORY:  reports that he has never smoked. He has never used smokeless tobacco. He reports that he does not drink alcohol or use illicit drugs.   SUBJECTIVE:   VITAL SIGNS: Temp:  [97.6 F (36.4 C)-98.8 F (37.1 C)] 97.6 F (36.4 C) (08/05 0900) Pulse Rate:  [33-115] 81 (08/05 0700) Resp:  [16-42] 20 (08/05 0700) BP: (71-121)/(34-91) 97/58 mmHg (08/05 0700) SpO2:  [8 %-100 %] 99 % (08/05 0700) Arterial Line BP: (107-164)/(39-72) 148/72 mmHg (08/05 0430) FiO2 (%):  [50 %-100 %] 50 % (08/05 0405) Weight:  [287 lb 4.2 oz (130.3 kg)-304 lb 3.8 oz (138 kg)] 287 lb 4.2 oz (130.3 kg) (08/05 0443) HEMODYNAMICS:   VENTILATOR SETTINGS: Vent Mode:  [-] PRVC FiO2 (%):  [50 %-100 %] 50 % Set Rate:  [20 bmp] 20 bmp Vt Set:  [620 mL] 620 mL PEEP:  [7 cmH20] 7 cmH20 Plateau Pressure:  [20 cmH20-23 cmH20] 21 cmH20 INTAKE / OUTPUT:  Intake/Output Summary (Last 24 hours) at 06/19/15 0950 Last data filed at 06/19/15 0730  Gross per 24 hour  Intake 9223.36 ml  Output   4300 ml  Net 4923.36 ml    PHYSICAL EXAMINATION: General: No apparent distress. On  vent Neuro: Wakes to command.  HEENT: PERRL, no JVD noted Cardiovascular: Regular Rate and rhythm. No MRG Lungs: Clear breath sounds antr Abdomen: Soft, non-tender, surgical dressing in place  LABS:  CBC  Recent Labs Lab 06/18/15 0658  06/18/15 1815 06/18/15 1930 06/19/15 0555  WBC 18.9*  --   --  9.1 11.7*  HGB 15.7  < > 12.9* 14.0 14.3  HCT 45.4  < > 38.0* 40.2 40.5  PLT 311  --   --  252 242  < > = values in this interval not displayed. Coag's  Recent Labs Lab 06/18/15 1530 06/18/15 1930 06/19/15 0555  APTT  --   --  34  INR 1.72* 1.39 1.51*   BMET  Recent Labs Lab 06/18/15 1400  06/18/15 1815 06/18/15 1930 06/19/15 0555  NA 135  < > 137 135 136  K 2.8*  < > 2.9* 2.5* 3.0*  CL 97*  --   --  99* 103  CO2 15*  --   --  20* 22  BUN 23*  --   --  22* 21*  CREATININE 1.82*  --   --  1.43* 1.03  GLUCOSE 140*  --   --  177* 117*  < > = values in this interval not displayed. Electrolytes  Recent Labs Lab 06/18/15 0658  06/18/15 1400 06/18/15 1930 06/18/15 2230 06/19/15 0555  CALCIUM  --   < > 8.3* 7.9*  --  7.8*  MG 1.8  --   --   --  1.7 1.6*  PHOS 5.7*  --   --   --   --  4.8*  < > = values in this interval not displayed. Sepsis Markers  Recent Labs Lab 06/18/15 0658 06/18/15 0914 06/18/15 1050 06/18/15 1400 06/19/15 0555 06/19/15 0703  LATICACIDVEN 8.4* 8.3*  --  8.7*  --  2.9*  PROCALCITON 5.42  --  25.47  --  32.23  --    ABG  Recent Labs Lab 06/18/15 1734 06/18/15 1815 06/19/15 0651  PHART 7.088* 7.210* 7.491*  PCO2ART 60.8* 50.2* 31.3*  PO2ART 81.0 80.0 132.0*   Liver Enzymes  Recent Labs Lab 06/18/15 0242 06/19/15 0555  AST 47* 82*  ALT 19 50  ALKPHOS 112 117  BILITOT 1.1 1.3*  ALBUMIN 3.1* 2.2*   Cardiac Enzymes  Recent Labs Lab 06/18/15 0242  TROPONINI 0.04*   Glucose  Recent Labs Lab 06/18/15 0816 06/18/15 1223 06/18/15 1935 06/18/15 2344 06/19/15 0403 06/19/15 0902  GLUCAP 143* 151* 160* 112*  109* 108*    Imaging Dg Chest Port 1 View  06/19/2015   CLINICAL DATA:  Hypoxia  EXAM: PORTABLE CHEST - 1 VIEW  COMPARISON:  June 18, 2015  FINDINGS: Endotracheal tube tip is 1.7 cm above the carina. The left subclavian catheter tip is at the junction of the left innominate vein and superior vena cava, stable. Nasogastric tube tip and side port are below the diaphragm. No pneumothorax.  There is mild bibasilar atelectatic change. There is no frank edema or consolidation. Heart is mildly enlarged with pulmonary vascularity within normal limits. No adenopathy. Patient is status post coronary artery bypass grafting.  IMPRESSION: Tube and catheter positions as described without pneumothorax. Mild bibasilar atelectasis. No frank edema or consolidation. Heart prominent but stable.   Electronically Signed   By: Bretta Bang III M.D.   On: 06/19/2015 07:24   Dg Chest Port 1 View  06/18/2015   CLINICAL DATA:  Endotracheal tube placement.  Initial encounter.  EXAM: PORTABLE CHEST - 1 VIEW  COMPARISON:  Chest radiograph performed earlier today at 10:30 a.m.  FINDINGS: The patient's endotracheal tube is seen ending 1-2 cm above the carina. This could be retracted 1-2 cm. The enteric tube is seen extending below the diaphragm. A left IJ line is noted ending about the proximal to mid SVC.  The lungs are relatively well expanded. Mild vascular congestion is noted. Mild bibasilar opacities likely reflect atelectasis, though mild interstitial edema could have a similar appearance. No pleural effusion or pneumothorax is seen.  The cardiomediastinal silhouette is enlarged. The patient is status post median sternotomy. An external pacing pad is noted. No acute osseous abnormalities are identified.  IMPRESSION: 1. Endotracheal tube seen ending 1-2 cm above the carina. This could be retracted 1-2 cm. 2. Mild vascular congestion and cardiomegaly noted. Mild bibasilar opacities likely reflect atelectasis, though mild  interstitial edema could have a similar appearance.   Electronically Signed   By: Roanna Raider M.D.   On: 06/18/2015 19:23   Dg Chest Port 1 View  06/18/2015   CLINICAL DATA:  Left central catheter placement  EXAM: PORTABLE  CHEST - 1 VIEW  COMPARISON:  Chest radiograph and chest CT obtained earlier in the day  FINDINGS: Central catheter tip is in the superior vena cava just beyond the junction with the left innominate vein. Nasogastric tube tip and side port are below the diaphragm. No pneumothorax. There is cardiomegaly with mild pulmonary venous hypertension. Patient is status post coronary artery bypass grafting. There is interstitial edema in the mid to lower lung zones with patchy atelectasis on the left side. No adenopathy.  IMPRESSION: Suspect a degree of congestive heart failure. There is also atelectasis on the left. No pneumothorax. Tube and catheter positions as described.   Electronically Signed   By: Bretta Bang III M.D.   On: 06/18/2015 10:50     ASSESSMENT / PLAN:  PULMONARY A: Intubated for surgery. Will need to continue vent support as he will go back to OR tomorrow.  P:   Reduce TV to 8cc/kg. Wean down PEEP as tolerated Check ABG later today.  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 Will start vasopressin and try to wean the levaphed down. Daily EKG and troponins  RENAL A:  AKI in setting of sepsis and shock Urine output is adequate and BUN/Cr are improving after resuscitation P:   Continue IVF. Will switch to D5 NS with K @125 /hr Replete low K. Continue to monitor renal function  GASTROINTESTINAL A:  s/p total colectomy for ischemic bowel Will return to OR for ileostomy and possible closure. P:   Keep NPO. Will likely need TPN after surgery tomorrow. Protonix for SUP  HEMATOLOGIC A: Stable. Elevated WBC count improved today. P:  Continue to  monitor.  INFECTIOUS A:  Septic shock from ischemic bowel. All cultures are negative to date.  P:   Abx: Ceftaz 8/4>> Vanco 8/5>> Flagyl 8/4>> Diflucan 8/4>>  Will be able to narrow depending on culture results.   ENDOCRINE A:  Hyperglycemia without h/o DM P: Cover with SSI   NEUROLOGIC A:  P:   RASS goal: -1  FAMILY  - Updates: Wife [name] updated over telephone - Inter-disciplinary family meet or Palliative Care meeting due by: 8/12  Total critical care time 40 mins.  Chilton Greathouse MD [facility] Pager [phone] If no answer or after 3pm call: [phone] 06/19/2015, 10:30 AM

## 2015-06-19 NOTE — Progress Notes (Signed)
PARENTERAL NUTRITION CONSULT NOTE - INITIAL  Pharmacy Consult for TPN Indication: prolonged ileus  Allergies  Allergen Reactions  . Penicillins Hives  . Doxycycline Rash  . Levaquin [Levofloxacin In D5w] Nausea Only and Other (See Comments)    Dizziness     Patient Measurements: Height: 5\' 9"  (175.3 cm) Weight: 287 lb 4.2 oz (130.3 kg) IBW/kg (Calculated) : 70.7 Adjusted Body Weight:  Usual Weight:   Vital Signs: Temp: 97.6 F (36.4 C) (08/05 0900) Temp Source: Oral (08/05 0410) BP: 103/59 mmHg (08/05 0900) Pulse Rate: 88 (08/05 0900) Intake/Output from previous day: 08/04 0701 - 08/05 0700 In: 9498.4 [I.V.:4675.4; WUJWJ:1914; IV Piggyback:3350] Out: 4050 [Urine:2250; Emesis/NG output:800; Drains:1000] Intake/Output from this shift: Total I/O In: 230 [NG/GT:30; IV Piggyback:200] Out: 1375 [Urine:875; Emesis/NG output:150; Drains:350]  Labs:  Recent Labs  06/18/15 0658 06/18/15 1530  06/18/15 1815 06/18/15 1930 06/19/15 0555  WBC 18.9*  --   --   --  9.1 11.7*  HGB 15.7  --   < > 12.9* 14.0 14.3  HCT 45.4  --   < > 38.0* 40.2 40.5  PLT 311  --   --   --  252 242  APTT  --   --   --   --   --  34  INR 1.78* 1.72*  --   --  1.39 1.51*  < > = values in this interval not displayed.   Recent Labs  06/18/15 0242 06/18/15 0658  06/18/15 1400  06/18/15 1815 06/18/15 1930 06/18/15 2230 06/19/15 0555  NA 132*  --   < > 135  < > 137 135  --  136  K <2.0*  --   < > 2.8*  < > 2.9* 2.5*  --  3.0*  CL 89*  --   < > 97*  --   --  99*  --  103  CO2 13*  --   < > 15*  --   --  20*  --  22  GLUCOSE 251*  --   < > 140*  --   --  177*  --  117*  BUN 15  --   < > 23*  --   --  22*  --  21*  CREATININE 1.21  --   < > 1.82*  --   --  1.43*  --  1.03  CALCIUM 9.5  --   < > 8.3*  --   --  7.9*  --  7.8*  MG  --  1.8  --   --   --   --   --  1.7 1.6*  PHOS  --  5.7*  --   --   --   --   --   --  4.8*  PROT 6.3*  --   --   --   --   --   --   --  4.7*  ALBUMIN 3.1*  --    --   --   --   --   --   --  2.2*  AST 47*  --   --   --   --   --   --   --  82*  ALT 19  --   --   --   --   --   --   --  50  ALKPHOS 112  --   --   --   --   --   --   --  117  BILITOT 1.1  --   --   --   --   --   --   --  1.3*  < > = values in this interval not displayed. Estimated Creatinine Clearance: 90.5 mL/min (by C-G formula based on Cr of 1.03).    Recent Labs  06/18/15 2344 06/19/15 0403 06/19/15 0902  GLUCAP 112* 109* 108*    Medical History: Past Medical History  Diagnosis Date  . Anxiety   . Hypertension   . Arthritis   . Sleep apnea     wears CPAP nightly  . Glaucoma     Insulin Requirements in the past 24 hours:  10 units  Current Nutrition:  NPO  Assessment: 69 y/o morbidly obese M recently under went CABG 05/22/2015 with post-op R leg cellulitis, afib. He came to ED 8/4 after losing consciousness on toilet. Pt was hypotensive. K=2.5.  Surgeries/Procedures: 8 8/4: Total abdominal colectomy 8/6: return to OR for ileostomy and possible closure.  GI: Morbid obesity, LBM 06/18/15, IV PPI Endo: no h/o DM.  CBGs have been 108-160/24h on SSI Renal: Scr down 1.43>1.03. Phos elevated 4.8, Mg 1.6 low (2g ordered), K=3 (K runs x 14 ordered in the last 24 hrs), Lactic acidosis, AKI improving Pulm: OSA, VDRF on fentanyl inf Cards: HTN, 8/4 EF 35-40%, Septic shock. Levophed inf>>Vasopressin Hepatobil: INR 1.39>1.51. AST elevated 82. Tbili 1.3 slightly elevated Neuro: Anxiety. Sedated on Fent ID:   PC 32.3, LA 2.9, WBC 11.7 slightly elevated. Septic shock secondary to ischemic colon. Afebrile. WBC up 11.7.  Fortaz/Flagyl/Vanco Best Practices: MC, SQ heparin (CBC WNL), IV PPI TPN Access: 8/4 Triple lumen CVC TPN start date: 06/19/2015, IV PPI    Nutritional Goals:  -2000 -2200 kCal, 110-125 grams of protein per day -Goal Clinimix (E) 5/15 at 115ml/hr would provide 120g protein and 1704 kcal/day (this does not include IV fat emulsion)  Plan:  - Back to OR  8/6 - Start Clinimix E 5/15 at 40 ml/hr - No IVFE for the first 7 days of TPN while in the ICU per ASPEN/SCCM guidelines (D#2).  - Daily multivitamin and trace elements - TPN labs qMon/Thurs; CMet, Mg, Phos in AM - f/u prealbumin level     Kinston Magnan S. Merilynn Finland, PharmD, [facility] Clinical Staff Pharmacist Pager [phone]  Misty Stanley Stillinger 06/19/2015,10:54 AM

## 2015-06-19 NOTE — Progress Notes (Signed)
1 Day Post-Op  Subjective: Alert on vent  Objective: Vital signs in last 24 hours: Temp:  [97.6 F (36.4 C)-98.8 F (37.1 C)] 97.6 F (36.4 C) (08/05 0900) Pulse Rate:  [33-115] 81 (08/05 0700) Resp:  [16-42] 20 (08/05 0700) BP: (68-121)/(34-91) 97/58 mmHg (08/05 0700) SpO2:  [8 %-100 %] 99 % (08/05 0700) Arterial Line BP: (107-164)/(39-72) 148/72 mmHg (08/05 0430) FiO2 (%):  [50 %-100 %] 50 % (08/05 0405) Weight:  [130.3 kg (287 lb 4.2 oz)-138 kg (304 lb 3.8 oz)] 130.3 kg (287 lb 4.2 oz) (08/05 0443) Last BM Date: 06/18/15  Intake/Output from previous day: 08/04 0701 - 08/05 0700 In: 9498.4 [I.V.:4675.4; ZOXWR:6045; IV Piggyback:3350] Out: 4050 [Urine:2250; Emesis/NG output:800; Drains:1000] Intake/Output this shift: Total I/O In: -  Out: 450 [Urine:450]  General appearance: no distress Resp: coarse bilateral breath sounds Cardio: regular rate and rhythm GI: vac in place  Lab Results:   Recent Labs  06/18/15 1930 06/19/15 0555  WBC 9.1 11.7*  HGB 14.0 14.3  HCT 40.2 40.5  PLT 252 242   BMET  Recent Labs  06/18/15 1930 06/19/15 0555  NA 135 136  K 2.5* 3.0*  CL 99* 103  CO2 20* 22  GLUCOSE 177* 117*  BUN 22* 21*  CREATININE 1.43* 1.03  CALCIUM 7.9* 7.8*   PT/INR  Recent Labs  06/18/15 1930 06/19/15 0555  LABPROT 17.1* 18.3*  INR 1.39 1.51*   ABG  Recent Labs  06/18/15 1815 06/19/15 0651  PHART 7.210* 7.491*  HCO3 20.1 23.9    Studies/Results: Ct Head Wo Contrast  06/18/2015   CLINICAL DATA:  69 year old hypertensive male with brief episode of syncope during bowel movement. Initial encounter.  EXAM: CT HEAD WITHOUT CONTRAST  TECHNIQUE: Contiguous axial images were obtained from the base of the skull through the vertex without intravenous contrast.  COMPARISON:  None.  FINDINGS: No intracranial hemorrhage.  No CT evidence of large acute infarct.  Superior right convexity cerebral spinal fluid collection with maximal transverse dimension 1.2  cm more suggestive of a small arachnoid cyst given the slight deformity of the adjacent superior sagittal sinus and gyrus rather than prominent sulcus. If further delineation were clinically desired MR would be necessary. Otherwise no evidence of intracranial mass lesion detected on this unenhanced exam.  Ventricles top-normal size.  Vascular calcifications.  Post lens replacements.  Minimal exophthalmos.  Small amount of gas left cavernous sinus region and left infratemporal fossa most likely related to in injected air.  IMPRESSION: No intracranial hemorrhage or CT evidence of large acute infarct.  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  Ventricles top-normal size.   Electronically Signed   By: Lacy Duverney M.D.   On: 06/18/2015 07:08   Ct Angio Chest Pe W/cm &/or Wo Cm  06/18/2015   CLINICAL DATA:  Syncopal episode on toilet.  EXAM: CT ANGIOGRAPHY CHEST  CT ABDOMEN AND PELVIS WITH CONTRAST  TECHNIQUE: Multidetector CT imaging of the chest was performed using the standard protocol during bolus administration of intravenous contrast. Multiplanar CT image reconstructions and MIPs were obtained to evaluate the vascular anatomy. Multidetector CT imaging of the abdomen and pelvis was performed using the standard protocol during bolus administration of intravenous contrast.  CONTRAST:  OMNIPAQUE IOHEXOL 350 MG/ML SOLN  COMPARISON:  Chest radiograph June 18, 2015 at 2:24 a.m.  FINDINGS: CTA CHEST FINDINGS  Please note, due to the reported cracked tubing and back and arm, limited contrast administration, non angiographic  phase.  Mediastinum: Heart size is moderate to severely enlarged, small pericardial effusion. Status post median sternotomy for CABG. Thoracic aorta is normal in course and caliber with moderate calcific atherosclerosis. Pulmonary vascular congestion.  Lungs: Small LEFT pleural effusion. Patchy ground-glass opacities LEFT lower lobe and, in the bilateral  hila. Tracheobronchial tree is patent and midline, no pneumothorax.  Soft tissues and osseous structures: Large body habitus. No suspicious bony lesions or acute osseous process.  CT ABDOMEN and PELVIS FINDINGS (respiratory motion degraded examination)  SOLID ORGANS: The liver, spleen, gallbladder, pancreas and adrenal glands are unremarkable.  GASTROINTESTINAL TRACT: Moderate to large amount of distal retained large bowel stool. Gas distended colon up to 6.7 cm. Focal hepatic flexure pneumatosis. Scattered small bowel air-fluid levels. The stomach, small bowel are normal in course and caliber without inflammatory changes. The appendix is not discretely identified, however there are no inflammatory changes in the right lower quadrant.  KIDNEYS/ URINARY TRACT: Kidneys are orthotopic, demonstrating symmetric enhancement. 4 mm RIGHT upper pole, 3 mm RIGHT upper pole nephrolithiasis, 4 mm LEFT lower pole nephrolithiasis. No hydronephrosis or solid renal masses. The unopacified ureters are normal in course and caliber. Delayed imaging through the kidneys demonstrates symmetric prompt contrast excretion within the proximal urinary collecting system. Urinary bladder is partially distended, 4 mm calculus dependent in RIGHT bladder.  PERITONEUM/RETROPERITONEUM: Aortoiliac vessels are normal in course and caliber, mild calcific atherosclerosis. No lymphadenopathy by CT size criteria. Prostate is enlarged, invading the base the urinary bladder. No intraperitoneal free fluid nor free air.  SOFT TISSUE/OSSEOUS STRUCTURES: Non-suspicious. Moderate fat containing LEFT inguinal hernia. Severe degenerative change of the lumbar spine, grade 1 L4-5 anterolisthesis with multi level severe neural foraminal narrowing, severe canal stenosis L4-5, moderate to severe L2-3 and L3-4.  Review of the MIP images confirms the above findings.  IMPRESSION: CTA CHEST:  Limited CTA of the chest, poor bolus timing.  Moderate to severe cardiomegaly  with small pericardial effusion. Evidence of pulmonary edema with small pleural effusions and, patchy airspace opacities most consistent with confluent edema.  CT ABDOMEN AND PELVIS: Focal colonic hepatic flexure pneumatosis without bowel perforation. Moderate to large amount of distal retained large bowel stool. Mild probable ileus, no bowel obstruction.  Bilateral nonobstructing nephrolithiasis measuring up to 4 mm.   Electronically Signed   By: Awilda Metro M.D.   On: 06/18/2015 07:00   Ct Abdomen Pelvis W Contrast  06/18/2015   CLINICAL DATA:  Syncopal episode on toilet.  EXAM: CT ANGIOGRAPHY CHEST  CT ABDOMEN AND PELVIS WITH CONTRAST  TECHNIQUE: Multidetector CT imaging of the chest was performed using the standard protocol during bolus administration of intravenous contrast. Multiplanar CT image reconstructions and MIPs were obtained to evaluate the vascular anatomy. Multidetector CT imaging of the abdomen and pelvis was performed using the standard protocol during bolus administration of intravenous contrast.  CONTRAST:  OMNIPAQUE IOHEXOL 350 MG/ML SOLN  COMPARISON:  Chest radiograph June 18, 2015 at 2:24 a.m.  FINDINGS: CTA CHEST FINDINGS  Please note, due to the reported cracked tubing and back and arm, limited contrast administration, non angiographic phase.  Mediastinum: Heart size is moderate to severely enlarged, small pericardial effusion. Status post median sternotomy for CABG. Thoracic aorta is normal in course and caliber with moderate calcific atherosclerosis. Pulmonary vascular congestion.  Lungs: Small LEFT pleural effusion. Patchy ground-glass opacities LEFT lower lobe and, in the bilateral hila. Tracheobronchial tree is patent and midline, no pneumothorax.  Soft tissues and osseous structures: Large body  habitus. No suspicious bony lesions or acute osseous process.  CT ABDOMEN and PELVIS FINDINGS (respiratory motion degraded examination)  SOLID ORGANS: The liver, spleen,  gallbladder, pancreas and adrenal glands are unremarkable.  GASTROINTESTINAL TRACT: Moderate to large amount of distal retained large bowel stool. Gas distended colon up to 6.7 cm. Focal hepatic flexure pneumatosis. Scattered small bowel air-fluid levels. The stomach, small bowel are normal in course and caliber without inflammatory changes. The appendix is not discretely identified, however there are no inflammatory changes in the right lower quadrant.  KIDNEYS/ URINARY TRACT: Kidneys are orthotopic, demonstrating symmetric enhancement. 4 mm RIGHT upper pole, 3 mm RIGHT upper pole nephrolithiasis, 4 mm LEFT lower pole nephrolithiasis. No hydronephrosis or solid renal masses. The unopacified ureters are normal in course and caliber. Delayed imaging through the kidneys demonstrates symmetric prompt contrast excretion within the proximal urinary collecting system. Urinary bladder is partially distended, 4 mm calculus dependent in RIGHT bladder.  PERITONEUM/RETROPERITONEUM: Aortoiliac vessels are normal in course and caliber, mild calcific atherosclerosis. No lymphadenopathy by CT size criteria. Prostate is enlarged, invading the base the urinary bladder. No intraperitoneal free fluid nor free air.  SOFT TISSUE/OSSEOUS STRUCTURES: Non-suspicious. Moderate fat containing LEFT inguinal hernia. Severe degenerative change of the lumbar spine, grade 1 L4-5 anterolisthesis with multi level severe neural foraminal narrowing, severe canal stenosis L4-5, moderate to severe L2-3 and L3-4.  Review of the MIP images confirms the above findings.  IMPRESSION: CTA CHEST:  Limited CTA of the chest, poor bolus timing.  Moderate to severe cardiomegaly with small pericardial effusion. Evidence of pulmonary edema with small pleural effusions and, patchy airspace opacities most consistent with confluent edema.  CT ABDOMEN AND PELVIS: Focal colonic hepatic flexure pneumatosis without bowel perforation. Moderate to large amount of distal  retained large bowel stool. Mild probable ileus, no bowel obstruction.  Bilateral nonobstructing nephrolithiasis measuring up to 4 mm.   Electronically Signed   By: Awilda Metro M.D.   On: 06/18/2015 07:00   Dg Chest Port 1 View  06/19/2015   CLINICAL DATA:  Hypoxia  EXAM: PORTABLE CHEST - 1 VIEW  COMPARISON:  June 18, 2015  FINDINGS: Endotracheal tube tip is 1.7 cm above the carina. The left subclavian catheter tip is at the junction of the left innominate vein and superior vena cava, stable. Nasogastric tube tip and side port are below the diaphragm. No pneumothorax.  There is mild bibasilar atelectatic change. There is no frank edema or consolidation. Heart is mildly enlarged with pulmonary vascularity within normal limits. No adenopathy. Patient is status post coronary artery bypass grafting.  IMPRESSION: Tube and catheter positions as described without pneumothorax. Mild bibasilar atelectasis. No frank edema or consolidation. Heart prominent but stable.   Electronically Signed   By: Bretta Bang III M.D.   On: 06/19/2015 07:24   Dg Chest Port 1 View  06/18/2015   CLINICAL DATA:  Endotracheal tube placement.  Initial encounter.  EXAM: PORTABLE CHEST - 1 VIEW  COMPARISON:  Chest radiograph performed earlier today at 10:30 a.m.  FINDINGS: The patient's endotracheal tube is seen ending 1-2 cm above the carina. This could be retracted 1-2 cm. The enteric tube is seen extending below the diaphragm. A left IJ line is noted ending about the proximal to mid SVC.  The lungs are relatively well expanded. Mild vascular congestion is noted. Mild bibasilar opacities likely reflect atelectasis, though mild interstitial edema could have a similar appearance. No pleural effusion or pneumothorax is seen.  The cardiomediastinal  silhouette is enlarged. The patient is status post median sternotomy. An external pacing pad is noted. No acute osseous abnormalities are identified.  IMPRESSION: 1. Endotracheal tube seen  ending 1-2 cm above the carina. This could be retracted 1-2 cm. 2. Mild vascular congestion and cardiomegaly noted. Mild bibasilar opacities likely reflect atelectasis, though mild interstitial edema could have a similar appearance.   Electronically Signed   By: Roanna Raider M.D.   On: 06/18/2015 19:23   Dg Chest Port 1 View  06/18/2015   CLINICAL DATA:  Left central catheter placement  EXAM: PORTABLE CHEST - 1 VIEW  COMPARISON:  Chest radiograph and chest CT obtained earlier in the day  FINDINGS: Central catheter tip is in the superior vena cava just beyond the junction with the left innominate vein. Nasogastric tube tip and side port are below the diaphragm. No pneumothorax. There is cardiomegaly with mild pulmonary venous hypertension. Patient is status post coronary artery bypass grafting. There is interstitial edema in the mid to lower lung zones with patchy atelectasis on the left side. No adenopathy.  IMPRESSION: Suspect a degree of congestive heart failure. There is also atelectasis on the left. No pneumothorax. Tube and catheter positions as described.   Electronically Signed   By: Bretta Bang III M.D.   On: 06/18/2015 10:50   Dg Chest Port 1 View  06/18/2015   CLINICAL DATA:  Respiratory distress  EXAM: PORTABLE CHEST - 1 VIEW  COMPARISON:  06/12/2015  FINDINGS: There is marked cardiomegaly, unchanged. There are mild vascular and interstitial congestive changes, worsened from 06/12/2015. There is no large effusion. There is no dense airspace consolidation.  IMPRESSION: New/ worsened vascular and interstitial congestive changes. Marked unchanged cardiomegaly   Electronically Signed   By: Ellery Plunk M.D.   On: 06/18/2015 02:34    Anti-infectives: Anti-infectives    Start     Dose/Rate Route Frequency Ordered Stop   06/19/15 1200  vancomycin (VANCOCIN) 1,750 mg in sodium chloride 0.9 % 500 mL IVPB     1,750 mg 250 mL/hr over 120 Minutes Intravenous Every 24 hours 06/18/15 1030      06/18/15 1200  fluconazole (DIFLUCAN) IVPB 400 mg     400 mg 100 mL/hr over 120 Minutes Intravenous Every 24 hours 06/18/15 1050     06/18/15 1130  vancomycin (VANCOCIN) 2,500 mg in sodium chloride 0.9 % 500 mL IVPB     2,500 mg 250 mL/hr over 120 Minutes Intravenous  Once 06/18/15 1030 06/18/15 1314   06/18/15 1130  cefTAZidime (FORTAZ) 2 g in dextrose 5 % 50 mL IVPB     2 g 100 mL/hr over 30 Minutes Intravenous Every 12 hours 06/18/15 1030     06/18/15 1030  metroNIDAZOLE (FLAGYL) IVPB 500 mg     500 mg 100 mL/hr over 60 Minutes Intravenous Every 8 hours 06/18/15 1000        Assessment/Plan: POD 1 tac, open abdomen  1. Remain on vent, will need to return to or tomorrow for ileostomy and possible closure.  Will need 2 sponges removed also 2. Start tna 3. Cont abx 4. Appreciate ccm assistance.   [facility] 06/19/2015

## 2015-06-19 NOTE — Progress Notes (Signed)
[facility] ICU Electrolyte Replacement Protocol  Patient Name: Jonathan Le DOB: 15-Sep-1946 MRN: [id number]  Date of Service  06/19/2015   HPI/Events of Note    Recent Labs Lab 06/18/15 0242 06/18/15 0658 06/18/15 0914 06/18/15 1400 06/18/15 1734 06/18/15 1815 06/18/15 1930 06/18/15 2230 06/19/15 0555  NA 132*  --  135 135 135 137 135  --  136  K <2.0*  --  2.1* 2.8* 2.9* 2.9* 2.5*  --  3.0*  CL 89*  --  97* 97*  --   --  99*  --  103  CO2 13*  --  18* 15*  --   --  20*  --  22  GLUCOSE 251*  --  145* 140*  --   --  177*  --  117*  BUN 15  --  20 23*  --   --  22*  --  21*  CREATININE 1.21  --  1.66* 1.82*  --   --  1.43*  --  1.03  CALCIUM 9.5  --  8.9 8.3*  --   --  7.9*  --  7.8*  MG  --  1.8  --   --   --   --   --  1.7 1.6*  PHOS  --  5.7*  --   --   --   --   --   --  4.8*    Estimated Creatinine Clearance: 90.5 mL/min (by C-G formula based on Cr of 1.03).  Intake/Output      08/04 0701 - 08/05 0700   I.V. (mL/kg) 4338.5 (33.3)   Blood 1473   IV Piggyback 3350   Total Intake(mL/kg) 9161.5 (70.3)   Urine (mL/kg/hr) 2250 (0.7)   Emesis/NG output 800 (0.3)   Drains 1000 (0.3)   Stool 0 (0)   Total Output 4050   Net +5111.5       Stool Occurrence 2 x    - I/O DETAILED x24h    Total I/O In: 2509.5 [I.V.:2409.5; IV Piggyback:100] Out: 2258 [Urine:1560; Emesis/NG output:800; Drains:1000] - I/O THIS SHIFT    ASSESSMENT   eICURN Interventions   ICU Electrolyte Replacement Protocol criteria met. Labs Replaced per protocol. MD notified   ASSESSMENT: MAJOR ELECTROLYTE    Jonathan Le 06/19/2015, 6:37 AM

## 2015-06-19 NOTE — Progress Notes (Addendum)
Nutrition Consult/Follow Up  DOCUMENTATION CODES:   Morbid obesity  INTERVENTION:    TPN per pharmacy  NUTRITION DIAGNOSIS:   Inadequate oral intake related to inability to eat as evidenced by NPO status, ongoing  GOAL:   Provide needs based on ASPEN/SCCM guidelines, progressing  MONITOR:   Vent status, Labs, Weight trends, I & O's  ASSESSMENT:   [name] s/p CABG 7/8 presented to ED 8/4 after losing consciousness on toilet. In ED he was hypoxic requiring NRB and hypotensive. PCCM called for admission.   Patient s/p procedure 8/4: TOTAL ABDOMINAL COLECTOMY ABDOMINAL VAC PLACEMENT  Patient is currently intubated on ventilator support -- NGT to LIS MV: 10.7 L/min Temp (24hrs), Avg:98.2 F (36.8 C), Min:97.6 F (36.4 C), Max:98.8 F (37.1 C)   RD consulted for new TPN.  Patient is receiving TPN with Clinimix E 5/15 @ 40 ml/hr.  No IVFE at this time given ASPEN/SCCM guidelines.  Provides 682 kcal and 48 grams protein per day.  Meets 48% minimum estimated energy needs and 33% minimum estimated protein needs.  Diet Order:  Diet NPO time specified .TPN (CLINIMIX-E) Adult  Skin:  Wound (see comment) (abdominal wound VAC)  Last BM:  06/18/15  Height:   Ht Readings from Last 1 Encounters:  06/19/15  (1.753 m)    Weight:   Wt Readings from Last 1 Encounters:  06/19/15 287 lb 4.2 oz (130.3 kg)    Ideal Body Weight:  72.7 kg  BMI:  Body mass index is 42.4 kg/(m^2).  Estimated Nutritional Needs:   Kcal:  1430-1820  Protein:  145-155 gm  Fluid:  per MD  EDUCATION NEEDS:   No education needs identified at this time  Maureen Chatters, RD, LDN Pager #: [phone] After-Hours Pager #: [phone]

## 2015-06-19 NOTE — Progress Notes (Signed)
[facility] PCCM PROGRESS NOTE  S: 69 year old male with septic shock secondary to ischemic bowel. To OR 8/5 PM for total colectomy. Postoperatively is awake and comfortable on vent. Hemodynamically stable on levophed. Pressor demand improving over the course of the night. Remains hypokalemic, being repleted by [facility] MD. Currenlty complains only of ETT discomfort and concerns that he will have "a bag" post colectomy.  O: BP 106/66 mmHg  Pulse 89  Temp(Src) 98.3 F (36.8 C) (Oral)  Resp 20  Ht  (1.753 m)  Wt 130.3 kg (287 lb 4.2 oz)  BMI 42.40 kg/m2  SpO2 99%  General:  Obese male in NAD on vent Neuro: Spontaneously awake, orietned, non-focal  HEENT:  [place]/AT, PERRL, no JVD noted Cardiovascular:  RRR, no MRG Lungs:  Clear bilateral breath sounds, synchronous with vent Abdomen:  Soft, non-tender, surgical dressing in place Musculoskeletal:  ROM intact, no acute deformity Skin:  Surgical would to abdomen   A/P: Shock in setting ischemic bowel VDRF Hypokalemia - Continue full vent support, as he will reportedly go back to OR - Wean pressors as able to MAP 65 - Give volume for CVP goal 10. - Surgery following, appreciate.  - Fentanyl gtt, PRN midazolam - ABG  Joneen Roach, ACNP [facility] Pulmonology/Critical Care Pager [phone] or [phone]

## 2015-06-20 ENCOUNTER — Encounter (HOSPITAL_COMMUNITY)
Admission: EM | Disposition: A | Discharge status: Nursing Home (Includes ICF) | Payer: Self-pay | Source: Home / Self Care | Attending: Pulmonary Disease

## 2015-06-20 ENCOUNTER — Inpatient Hospital Stay (HOSPITAL_COMMUNITY): Payer: Medicare Other | Admitting: Anesthesiology

## 2015-06-20 ENCOUNTER — Inpatient Hospital Stay (HOSPITAL_COMMUNITY): Payer: Medicare Other

## 2015-06-20 DIAGNOSIS — J9601 Acute respiratory failure with hypoxia: Secondary | ICD-10-CM

## 2015-06-20 DIAGNOSIS — A419 Sepsis, unspecified organism: Principal | ICD-10-CM

## 2015-06-20 DIAGNOSIS — R6521 Severe sepsis with septic shock: Secondary | ICD-10-CM

## 2015-06-20 HISTORY — PX: LAPAROTOMY: SHX154

## 2015-06-20 LAB — GLUCOSE, CAPILLARY
Glucose-Capillary: 138 mg/dL — ABNORMAL HIGH (ref 65–99)
Glucose-Capillary: 142 mg/dL — ABNORMAL HIGH (ref 65–99)
Glucose-Capillary: 145 mg/dL — ABNORMAL HIGH (ref 65–99)
Glucose-Capillary: 150 mg/dL — ABNORMAL HIGH (ref 65–99)
Glucose-Capillary: 154 mg/dL — ABNORMAL HIGH (ref 65–99)
Glucose-Capillary: 182 mg/dL — ABNORMAL HIGH (ref 65–99)

## 2015-06-20 LAB — COMPREHENSIVE METABOLIC PANEL
ALT: 63 U/L (ref 17–63)
AST: 74 U/L — ABNORMAL HIGH (ref 15–41)
Albumin: 1.8 g/dL — ABNORMAL LOW (ref 3.5–5.0)
Alkaline Phosphatase: 109 U/L (ref 38–126)
Anion gap: 8 (ref 5–15)
BUN: 17 mg/dL (ref 6–20)
CO2: 23 mmol/L (ref 22–32)
Calcium: 7.7 mg/dL — ABNORMAL LOW (ref 8.9–10.3)
Chloride: 105 mmol/L (ref 101–111)
Creatinine, Ser: 0.68 mg/dL (ref 0.61–1.24)
GFR calc Af Amer: >60 mL/min (ref >60)
GFR calc non Af Amer: >60 mL/min (ref >60)
Glucose, Bld: 173 mg/dL — ABNORMAL HIGH (ref 65–99)
Potassium: 3.1 mmol/L — ABNORMAL LOW (ref 3.5–5.1)
Sodium: 136 mmol/L (ref 135–145)
Total Bilirubin: 0.8 mg/dL (ref 0.3–1.2)
Total Protein: 4.6 g/dL — ABNORMAL LOW (ref 6.5–8.1)

## 2015-06-20 LAB — CBC
HCT: 35.6 % — ABNORMAL LOW (ref 39.0–52.0)
Hemoglobin: 11.8 g/dL — ABNORMAL LOW (ref 13.0–17.0)
MCH: 28.4 pg (ref 26.0–34.0)
MCHC: 33.1 g/dL (ref 30.0–36.0)
MCV: 85.6 fL (ref 78.0–100.0)
PLATELETS: 169 10*3/uL (ref 150–400)
RBC: 4.16 MIL/uL — ABNORMAL LOW (ref 4.22–5.81)
RDW: 14.8 % (ref 11.5–15.5)
WBC: 10.1 10*3/uL (ref 4.0–10.5)

## 2015-06-20 LAB — PROCALCITONIN: Procalcitonin: 15.44 ng/mL

## 2015-06-20 LAB — CBC WITH DIFFERENTIAL/PLATELET
Basophils Absolute: 0 10*3/uL (ref 0.0–0.1)
Basophils Relative: 0 % (ref 0–1)
Eosinophils Absolute: 0.1 10*3/uL (ref 0.0–0.7)
Eosinophils Relative: 1 % (ref 0–5)
HCT: 36.3 % — ABNORMAL LOW (ref 39.0–52.0)
Hemoglobin: 12.1 g/dL — ABNORMAL LOW (ref 13.0–17.0)
Lymphocytes Relative: 12 % (ref 12–46)
Lymphs Abs: 1.1 10*3/uL (ref 0.7–4.0)
MCH: 28.5 pg (ref 26.0–34.0)
MCHC: 33.3 g/dL (ref 30.0–36.0)
MCV: 85.4 fL (ref 78.0–100.0)
Monocytes Absolute: 0.6 10*3/uL (ref 0.1–1.0)
Monocytes Relative: 7 % (ref 3–12)
Neutro Abs: 7.2 10*3/uL (ref 1.7–7.7)
Neutrophils Relative %: 80 % — ABNORMAL HIGH (ref 43–77)
Platelets: 187 10*3/uL (ref 150–400)
RBC: 4.25 MIL/uL (ref 4.22–5.81)
RDW: 14.9 % (ref 11.5–15.5)
WBC: 8.9 10*3/uL (ref 4.0–10.5)

## 2015-06-20 LAB — PROTIME-INR
INR: 1.43 (ref 0.00–1.49)
PROTHROMBIN TIME: 17.5 s — AB (ref 11.6–15.2)

## 2015-06-20 LAB — TROPONIN I: Troponin I: 0.1 ng/mL — ABNORMAL HIGH (ref <0.031)

## 2015-06-20 LAB — POCT I-STAT 3, ART BLOOD GAS (G3+)
Acid-base deficit: 3 mmol/L — ABNORMAL HIGH (ref 0.0–2.0)
Bicarbonate: 21.8 mEq/L (ref 20.0–24.0)
O2 Saturation: 99 %
Patient temperature: 98.8
TCO2: 23 mmol/L (ref 0–100)
pCO2 arterial: 37.5 mmHg (ref 35.0–45.0)
pH, Arterial: 7.373 (ref 7.350–7.450)
pO2, Arterial: 123 mmHg — ABNORMAL HIGH (ref 80.0–100.0)

## 2015-06-20 LAB — APTT: aPTT: 24 seconds (ref 24–37)

## 2015-06-20 LAB — TRIGLYCERIDES: Triglycerides: 69 mg/dL (ref <150)

## 2015-06-20 LAB — LACTIC ACID, PLASMA: Lactic Acid, Venous: 2.4 mmol/L (ref 0.5–2.0)

## 2015-06-20 LAB — MAGNESIUM: Magnesium: 1.9 mg/dL (ref 1.7–2.4)

## 2015-06-20 LAB — PHOSPHORUS: Phosphorus: 1.9 mg/dL — ABNORMAL LOW (ref 2.5–4.6)

## 2015-06-20 LAB — PREALBUMIN: Prealbumin: 5.2 mg/dL — ABNORMAL LOW (ref 18–38)

## 2015-06-20 SURGERY — LAPAROTOMY, EXPLORATORY
Anesthesia: General | Site: Abdomen

## 2015-06-20 MED ORDER — POTASSIUM CHLORIDE 10 MEQ/50ML IV SOLN
10.0000 meq | INTRAVENOUS | Status: AC
  Administered 2015-06-20 (×3): 10 meq via INTRAVENOUS
  Filled 2015-06-20 (×3): qty 50

## 2015-06-20 MED ORDER — DEXTROSE 5 % IV SOLN
2.0000 g | Freq: Three times a day (TID) | INTRAVENOUS | Status: AC
  Administered 2015-06-20 – 2015-07-02 (×35): 2 g via INTRAVENOUS
  Filled 2015-06-20 (×37): qty 2

## 2015-06-20 MED ORDER — VECURONIUM BROMIDE 10 MG IV SOLR
INTRAVENOUS | Status: AC
  Filled 2015-06-20: qty 10

## 2015-06-20 MED ORDER — SODIUM CHLORIDE 0.9 % IJ SOLN
INTRAMUSCULAR | Status: AC
  Filled 2015-06-20: qty 10

## 2015-06-20 MED ORDER — 0.9 % SODIUM CHLORIDE (POUR BTL) OPTIME
TOPICAL | Status: DC | PRN
  Administered 2015-06-20: 6000 mL

## 2015-06-20 MED ORDER — TRACE MINERALS CR-CU-MN-SE-ZN 10-1000-500-60 MCG/ML IV SOLN
INTRAVENOUS | Status: AC
  Administered 2015-06-20: 18:00:00 via INTRAVENOUS
  Filled 2015-06-20: qty 960

## 2015-06-20 MED ORDER — MIDAZOLAM HCL 2 MG/2ML IJ SOLN
2.0000 mg | Freq: Once | INTRAMUSCULAR | Status: AC
Start: 2015-06-20 — End: 2015-06-20
  Administered 2015-06-20: 2 mg via INTRAVENOUS

## 2015-06-20 MED ORDER — POTASSIUM PHOSPHATES 15 MMOLE/5ML IV SOLN
20.0000 mmol | Freq: Once | INTRAVENOUS | Status: AC
  Administered 2015-06-20: 20 mmol via INTRAVENOUS
  Filled 2015-06-20: qty 6.67

## 2015-06-20 MED ORDER — VECURONIUM BROMIDE 10 MG IV SOLR
INTRAVENOUS | Status: DC | PRN
  Administered 2015-06-20: 5 mg via INTRAVENOUS

## 2015-06-20 MED ORDER — MIDAZOLAM HCL 2 MG/2ML IJ SOLN
INTRAMUSCULAR | Status: AC
  Filled 2015-06-20: qty 4

## 2015-06-20 MED ORDER — MIDAZOLAM HCL 2 MG/2ML IJ SOLN
INTRAMUSCULAR | Status: DC | PRN
  Administered 2015-06-20: 2 mg via INTRAVENOUS

## 2015-06-20 SURGICAL SUPPLY — 49 items
BLADE SURG ROTATE 9660 (MISCELLANEOUS) IMPLANT
CANISTER SUCTION 2500CC (MISCELLANEOUS) ×3 IMPLANT
CANISTER WOUND CARE 500ML ATS (WOUND CARE) ×2 IMPLANT
CHLORAPREP W/TINT 26ML (MISCELLANEOUS) ×3 IMPLANT
COVER MAYO STAND STRL (DRAPES) ×3 IMPLANT
COVER SURGICAL LIGHT HANDLE (MISCELLANEOUS) ×3 IMPLANT
DRAPE LAPAROSCOPIC ABDOMINAL (DRAPES) ×3 IMPLANT
DRAPE PROXIMA HALF (DRAPES) IMPLANT
DRAPE UTILITY XL STRL (DRAPES) ×6 IMPLANT
DRAPE WARM FLUID 44X44 (DRAPE) ×3 IMPLANT
DRSG OPSITE POSTOP 4X10 (GAUZE/BANDAGES/DRESSINGS) IMPLANT
DRSG OPSITE POSTOP 4X8 (GAUZE/BANDAGES/DRESSINGS) IMPLANT
DRSG VAC ATS MED SENSATRAC (GAUZE/BANDAGES/DRESSINGS) ×2 IMPLANT
ELECT BLADE 6.5 EXT (BLADE) IMPLANT
ELECT CAUTERY BLADE 6.4 (BLADE) ×3 IMPLANT
ELECT REM PT RETURN 9FT ADLT (ELECTROSURGICAL) ×3
ELECTRODE REM PT RTRN 9FT ADLT (ELECTROSURGICAL) ×1 IMPLANT
GLOVE BIO SURGEON STRL SZ 6 (GLOVE) ×3 IMPLANT
GLOVE BIOGEL PI IND STRL 6.5 (GLOVE) ×1 IMPLANT
GLOVE BIOGEL PI INDICATOR 6.5 (GLOVE) ×2
GOWN STRL REUS W/ TWL LRG LVL3 (GOWN DISPOSABLE) ×2 IMPLANT
GOWN STRL REUS W/TWL 2XL LVL3 (GOWN DISPOSABLE) ×3 IMPLANT
GOWN STRL REUS W/TWL LRG LVL3 (GOWN DISPOSABLE) ×6
KIT BASIN OR (CUSTOM PROCEDURE TRAY) ×3 IMPLANT
KIT OSTOMY DRAINABLE 2.75 STR (WOUND CARE) ×2 IMPLANT
KIT ROOM TURNOVER OR (KITS) ×3 IMPLANT
LIGASURE IMPACT 36 18CM CVD LR (INSTRUMENTS) IMPLANT
NS IRRIG 1000ML POUR BTL (IV SOLUTION) ×6 IMPLANT
PACK GENERAL/GYN (CUSTOM PROCEDURE TRAY) ×3 IMPLANT
PAD ARMBOARD 7.5X6 YLW CONV (MISCELLANEOUS) ×3 IMPLANT
PENCIL BUTTON HOLSTER BLD 10FT (ELECTRODE) IMPLANT
SPECIMEN JAR LARGE (MISCELLANEOUS) IMPLANT
SPONGE LAP 18X18 X RAY DECT (DISPOSABLE) IMPLANT
STAPLER VISISTAT 35W (STAPLE) ×3 IMPLANT
SUCTION POOLE TIP (SUCTIONS) ×3 IMPLANT
SUT PDS AB 1 TP1 96 (SUTURE) ×4 IMPLANT
SUT PDS II 0 TP-1 LOOPED 60 (SUTURE) ×6 IMPLANT
SUT VIC AB 2-0 SH 18 (SUTURE) ×3 IMPLANT
SUT VIC AB 3-0 SH 18 (SUTURE) ×5 IMPLANT
SUT VIC AB 3-0 SH 8-18 (SUTURE) ×2 IMPLANT
SUT VICRYL 4-0 PS2 18IN ABS (SUTURE) IMPLANT
SUT VICRYL AB 2 0 TIES (SUTURE) ×3 IMPLANT
SUT VICRYL AB 3 0 TIES (SUTURE) ×3 IMPLANT
TOWEL OR 17X24 6PK STRL BLUE (TOWEL DISPOSABLE) ×3 IMPLANT
TOWEL OR 17X26 10 PK STRL BLUE (TOWEL DISPOSABLE) ×3 IMPLANT
TRAY FOLEY CATH 16FRSI W/METER (SET/KITS/TRAYS/PACK) IMPLANT
TUBE CONNECTING 12'X1/4 (SUCTIONS)
TUBE CONNECTING 12X1/4 (SUCTIONS) IMPLANT
YANKAUER SUCT BULB TIP NO VENT (SUCTIONS) IMPLANT

## 2015-06-20 NOTE — Anesthesia Postprocedure Evaluation (Signed)
  Anesthesia Post-op Note  Patient: Jonathan Le  Procedure(s) Performed: Procedure(s): WASHOUT OF ABDOMEN, CLOSURE OF ABDOMINAL WALL WITH PLACEMENT OF WOUND VAC AND ILLEOSTOMY  (N/A)  Patient Location: ICU  Anesthesia Type: General  Level of Consciousness: sedated and unresponsive  Airway and Oxygen Therapy: Patient remains intubated per anesthesia plan  Post-op Pain: sedated, unable to evaluate  Post-op Assessment: Post-op Vital signs reviewed, Patient's Cardiovascular Status Stable and Respiratory Function Stable              Post-op Vital Signs: Reviewed and stable  Last Vitals:  Filed Vitals:   06/20/15 0800  BP: 132/63  Pulse: 101  Temp:   Resp: 20    Complications: No apparent anesthesia complications

## 2015-06-20 NOTE — OR Nursing (Signed)
Two retained sponges removed from abdomen and placed in red bucket.

## 2015-06-20 NOTE — Progress Notes (Signed)
PARENTERAL NUTRITION CONSULT NOTE - FOLLOW UP  Pharmacy Consult for TPN Indication: prolonged ileus  Allergies  Allergen Reactions  . Penicillins Hives  . Doxycycline Rash  . Levaquin [Levofloxacin In D5w] Nausea Only and Other (See Comments)    Dizziness     Patient Measurements: Height: 5\' 9"  (175.3 cm) Weight: 295 lb 6.7 oz (134 kg) IBW/kg (Calculated) : 70.7   Vital Signs: Temp: 98.9 F (37.2 C) (08/06 0400) Temp Source: Oral (08/06 0400) BP: 132/63 mmHg (08/06 0800) Pulse Rate: 101 (08/06 0800) Intake/Output from previous day: 08/05 0701 - 08/06 0700 In: 6964.6 [I.V.:4174.6; NG/GT:180; IV Piggyback:2050; TPN:560] Out: 4035 [Urine:2385; Emesis/NG output:400; Drains:1250] Intake/Output from this shift: Total I/O In: 345.1 [I.V.:125.1; NG/GT:30; IV Piggyback:150; TPN:40] Out: 520 [Urine:360; Emesis/NG output:50; Drains:100; Blood:10]  Labs:  Recent Labs  06/18/15 1930 06/19/15 0555 06/20/15 0430  WBC 9.1 11.7* 8.9  HGB 14.0 14.3 12.1*  HCT 40.2 40.5 36.3*  PLT 252 242 187  APTT  --  34 24  INR 1.39 1.51* 1.43     Recent Labs  06/18/15 0242  06/18/15 0658  06/18/15 2230 06/19/15 0555 06/19/15 1245 06/20/15 0430 06/20/15 0500 06/20/15 0555  NA 132*  --   --   < >  --  136 137  --   --  136  K <2.0*  --   --   < >  --  3.0* 3.4*  --   --  3.1*  CL 89*  --   --   < >  --  103 104  --   --  105  CO2 13*  --   --   < >  --  22 23  --   --  23  GLUCOSE 251*  --   --   < >  --  117* 130*  --   --  173*  BUN 15  --   --   < >  --  21* 19  --   --  17  CREATININE 1.21  --   --   < >  --  1.03 0.89  --   --  0.68  CALCIUM 9.5  --   --   < >  --  7.8* 7.8*  --   --  7.7*  MG  --   < > 1.8  --  1.7 1.6*  --  1.9  --   --   PHOS  --   --  5.7*  --   --  4.8*  --  1.9*  --   --   PROT 6.3*  --   --   --   --  4.7*  --   --   --  4.6*  ALBUMIN 3.1*  --   --   --   --  2.2*  --   --   --  1.8*  AST 47*  --   --   --   --  82*  --   --   --  74*  ALT 19  --    --   --   --  50  --   --   --  63  ALKPHOS 112  --   --   --   --  117  --   --   --  109  BILITOT 1.1  --   --   --   --  1.3*  --   --   --  0.8  PREALBUMIN  --   --   --   --   --   --   --  5.2*  --   --   TRIG  --   --   --   --   --   --   --   --  69  --   < > = values in this interval not displayed. Estimated Creatinine Clearance: 106.4 mL/min (by C-G formula based on Cr of 0.89).    Recent Labs  06/19/15 2017 06/19/15 2346 06/20/15 0359  GLUCAP 141* 154* 182*    Medications:  Anti-infectives    Start     Dose/Rate Route Frequency Ordered Stop   06/20/15 0200  vancomycin (VANCOCIN) IVPB 1000 mg/200 mL premix  Status:  Discontinued     1,000 mg 200 mL/hr over 60 Minutes Intravenous Every 12 hours 06/19/15 1512 06/20/15 0737   06/19/15 1200  vancomycin (VANCOCIN) 1,750 mg in sodium chloride 0.9 % 500 mL IVPB  Status:  Discontinued     1,750 mg 250 mL/hr over 120 Minutes Intravenous Every 24 hours 06/18/15 1030 06/19/15 1512   06/18/15 1200  fluconazole (DIFLUCAN) IVPB 400 mg     400 mg 100 mL/hr over 120 Minutes Intravenous Every 24 hours 06/18/15 1050     06/18/15 1130  vancomycin (VANCOCIN) 2,500 mg in sodium chloride 0.9 % 500 mL IVPB     2,500 mg 250 mL/hr over 120 Minutes Intravenous  Once 06/18/15 1030 06/18/15 1314   06/18/15 1130  cefTAZidime (FORTAZ) 2 g in dextrose 5 % 50 mL IVPB     2 g 100 mL/hr over 30 Minutes Intravenous Every 12 hours 06/18/15 1030     06/18/15 1030  metroNIDAZOLE (FLAGYL) IVPB 500 mg     500 mg 100 mL/hr over 60 Minutes Intravenous Every 8 hours 06/18/15 1000        Insulin Requirements in the past 24 hours:  14 units SSI  Current Nutrition:  NPO  Assessment: 69 y/o morbidly obese Le recently under went CABG 05/22/2015 with post-op R leg cellulitis, afib. He came to ED 8/4 after losing consciousness on toilet. Pt was hypotensive. K=2.5.  Surgeries/Procedures: 8 8/4: Total abdominal colectomy 8/6: return to OR for ileostomy and  abdominal closure  GI: Morbid obesity, LBM 06/18/15, IV PPI; TG 69; Prealbumin 5.2 Endo: no h/o DM. CBGs have been 121-182/24h on SSI Renal: Scr down 0.68. Phos was elevated 4.8, now 1.9 (KPhos ordered); Mg 1.9, K=3.1. Will need Phos and K to stabilize before advancing TPN.  Lactic acidosis, AKI improving Pulm: OSA, VDRF on fentanyl inf Cards: HTN, 8/4 EF 35-40%, Septic shock. Levophed inf>>Vasopressin Hepatobil: INR 1.43. AST elevated 82. Tbili 1.3 slightly elevated Neuro: Anxiety. Sedated on Fent ID:WBC 10.2, PCT 15.4. Septic shock secondary to ischemic colon. Afebrile. Fortaz/Flagyl/Vanco Best Practices: MC, SQ heparin (CBC WNL), IV PPI TPN Access: 8/4 Triple lumen CVC TPN start date: 06/19/2015, IV PPI   Nutritional Goals:  -1430-1820 kCal, 145-155 grams of protein per day -Goal Clinimix (E) 5/15 at 165ml/hr would provide 120g protein and 1704 kcal/day (this does not include IV fat emulsion)  Plan:  - Continue Clinimix E 5/15 at 40 ml/hr.  Evaluate 8/7 for rate advancement if K and Phos are replaced and stable. - No IVFE for the first 7 days of TPN while in the ICU per ASPEN/SCCM guidelines (D#2).  - Give KCL x 3 runs.  This will provide a total of  K today (KPhos provides 29 mEq K).  - Maintenance IV fluids were decreased by Dr. Vassie Loll this AM - will not make further adjustments to rate today. - Daily multivitamin and trace elements - TPN labs qMon/Thurs; CMET, Mg, Phos in AM  Estella Husk, Pharm.D., BCPS, AAHIVP Clinical Pharmacist Phone: [phone] or [phone] 06/20/2015, 10:12 AM

## 2015-06-20 NOTE — Addendum Note (Signed)
Addendum  created 06/20/15 1512 by Atilano Ina, CRNA   Modules edited: Anesthesia Blocks and Procedures, Clinical Notes   Clinical Notes:  File: [id number]

## 2015-06-20 NOTE — H&P (View-Only) (Signed)
1 Day Post-Op  Subjective: Alert on vent  Objective: Vital signs in last 24 hours: Temp:  [97.6 F (36.4 C)-98.8 F (37.1 C)] 97.6 F (36.4 C) (08/05 0900) Pulse Rate:  [33-115] 81 (08/05 0700) Resp:  [16-42] 20 (08/05 0700) BP: (68-121)/(34-91) 97/58 mmHg (08/05 0700) SpO2:  [8 %-100 %] 99 % (08/05 0700) Arterial Line BP: (107-164)/(39-72) 148/72 mmHg (08/05 0430) FiO2 (%):  [50 %-100 %] 50 % (08/05 0405) Weight:  [130.3 kg (287 lb 4.2 oz)-138 kg (304 lb 3.8 oz)] 130.3 kg (287 lb 4.2 oz) (08/05 0443) Last BM Date: 06/18/15  Intake/Output from previous day: 08/04 0701 - 08/05 0700 In: 9498.4 [I.V.:4675.4; ZOXWR:6045; IV Piggyback:3350] Out: 4050 [Urine:2250; Emesis/NG output:800; Drains:1000] Intake/Output this shift: Total I/O In: -  Out: 450 [Urine:450]  General appearance: no distress Resp: coarse bilateral breath sounds Cardio: regular rate and rhythm GI: vac in place  Lab Results:   Recent Labs  06/18/15 1930 06/19/15 0555  WBC 9.1 11.7*  HGB 14.0 14.3  HCT 40.2 40.5  PLT 252 242   BMET  Recent Labs  06/18/15 1930 06/19/15 0555  NA 135 136  K 2.5* 3.0*  CL 99* 103  CO2 20* 22  GLUCOSE 177* 117*  BUN 22* 21*  CREATININE 1.43* 1.03  CALCIUM 7.9* 7.8*   PT/INR  Recent Labs  06/18/15 1930 06/19/15 0555  LABPROT 17.1* 18.3*  INR 1.39 1.51*   ABG  Recent Labs  06/18/15 1815 06/19/15 0651  PHART 7.210* 7.491*  HCO3 20.1 23.9    Studies/Results: Ct Head Wo Contrast  06/18/2015   CLINICAL DATA:  69 year old hypertensive male with brief episode of syncope during bowel movement. Initial encounter.  EXAM: CT HEAD WITHOUT CONTRAST  TECHNIQUE: Contiguous axial images were obtained from the base of the skull through the vertex without intravenous contrast.  COMPARISON:  None.  FINDINGS: No intracranial hemorrhage.  No CT evidence of large acute infarct.  Superior right convexity cerebral spinal fluid collection with maximal transverse dimension 1.2  cm more suggestive of a small arachnoid cyst given the slight deformity of the adjacent superior sagittal sinus and gyrus rather than prominent sulcus. If further delineation were clinically desired MR would be necessary. Otherwise no evidence of intracranial mass lesion detected on this unenhanced exam.  Ventricles top-normal size.  Vascular calcifications.  Post lens replacements.  Minimal exophthalmos.  Small amount of gas left cavernous sinus region and left infratemporal fossa most likely related to in injected air.  IMPRESSION: No intracranial hemorrhage or CT evidence of large acute infarct.  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  Ventricles top-normal size.   Electronically Signed   By: Lacy Duverney M.D.   On: 06/18/2015 07:08   Ct Angio Chest Pe W/cm &/or Wo Cm  06/18/2015   CLINICAL DATA:  Syncopal episode on toilet.  EXAM: CT ANGIOGRAPHY CHEST  CT ABDOMEN AND PELVIS WITH CONTRAST  TECHNIQUE: Multidetector CT imaging of the chest was performed using the standard protocol during bolus administration of intravenous contrast. Multiplanar CT image reconstructions and MIPs were obtained to evaluate the vascular anatomy. Multidetector CT imaging of the abdomen and pelvis was performed using the standard protocol during bolus administration of intravenous contrast.  CONTRAST:  OMNIPAQUE IOHEXOL 350 MG/ML SOLN  COMPARISON:  Chest radiograph June 18, 2015 at 2:24 a.m.  FINDINGS: CTA CHEST FINDINGS  Please note, due to the reported cracked tubing and back and arm, limited contrast administration, non angiographic  phase.  Mediastinum: Heart size is moderate to severely enlarged, small pericardial effusion. Status post median sternotomy for CABG. Thoracic aorta is normal in course and caliber with moderate calcific atherosclerosis. Pulmonary vascular congestion.  Lungs: Small LEFT pleural effusion. Patchy ground-glass opacities LEFT lower lobe and, in the bilateral  hila. Tracheobronchial tree is patent and midline, no pneumothorax.  Soft tissues and osseous structures: Large body habitus. No suspicious bony lesions or acute osseous process.  CT ABDOMEN and PELVIS FINDINGS (respiratory motion degraded examination)  SOLID ORGANS: The liver, spleen, gallbladder, pancreas and adrenal glands are unremarkable.  GASTROINTESTINAL TRACT: Moderate to large amount of distal retained large bowel stool. Gas distended colon up to 6.7 cm. Focal hepatic flexure pneumatosis. Scattered small bowel air-fluid levels. The stomach, small bowel are normal in course and caliber without inflammatory changes. The appendix is not discretely identified, however there are no inflammatory changes in the right lower quadrant.  KIDNEYS/ URINARY TRACT: Kidneys are orthotopic, demonstrating symmetric enhancement. 4 mm RIGHT upper pole, 3 mm RIGHT upper pole nephrolithiasis, 4 mm LEFT lower pole nephrolithiasis. No hydronephrosis or solid renal masses. The unopacified ureters are normal in course and caliber. Delayed imaging through the kidneys demonstrates symmetric prompt contrast excretion within the proximal urinary collecting system. Urinary bladder is partially distended, 4 mm calculus dependent in RIGHT bladder.  PERITONEUM/RETROPERITONEUM: Aortoiliac vessels are normal in course and caliber, mild calcific atherosclerosis. No lymphadenopathy by CT size criteria. Prostate is enlarged, invading the base the urinary bladder. No intraperitoneal free fluid nor free air.  SOFT TISSUE/OSSEOUS STRUCTURES: Non-suspicious. Moderate fat containing LEFT inguinal hernia. Severe degenerative change of the lumbar spine, grade 1 L4-5 anterolisthesis with multi level severe neural foraminal narrowing, severe canal stenosis L4-5, moderate to severe L2-3 and L3-4.  Review of the MIP images confirms the above findings.  IMPRESSION: CTA CHEST:  Limited CTA of the chest, poor bolus timing.  Moderate to severe cardiomegaly  with small pericardial effusion. Evidence of pulmonary edema with small pleural effusions and, patchy airspace opacities most consistent with confluent edema.  CT ABDOMEN AND PELVIS: Focal colonic hepatic flexure pneumatosis without bowel perforation. Moderate to large amount of distal retained large bowel stool. Mild probable ileus, no bowel obstruction.  Bilateral nonobstructing nephrolithiasis measuring up to 4 mm.   Electronically Signed   By: Awilda Metro M.D.   On: 06/18/2015 07:00   Ct Abdomen Pelvis W Contrast  06/18/2015   CLINICAL DATA:  Syncopal episode on toilet.  EXAM: CT ANGIOGRAPHY CHEST  CT ABDOMEN AND PELVIS WITH CONTRAST  TECHNIQUE: Multidetector CT imaging of the chest was performed using the standard protocol during bolus administration of intravenous contrast. Multiplanar CT image reconstructions and MIPs were obtained to evaluate the vascular anatomy. Multidetector CT imaging of the abdomen and pelvis was performed using the standard protocol during bolus administration of intravenous contrast.  CONTRAST:  OMNIPAQUE IOHEXOL 350 MG/ML SOLN  COMPARISON:  Chest radiograph June 18, 2015 at 2:24 a.m.  FINDINGS: CTA CHEST FINDINGS  Please note, due to the reported cracked tubing and back and arm, limited contrast administration, non angiographic phase.  Mediastinum: Heart size is moderate to severely enlarged, small pericardial effusion. Status post median sternotomy for CABG. Thoracic aorta is normal in course and caliber with moderate calcific atherosclerosis. Pulmonary vascular congestion.  Lungs: Small LEFT pleural effusion. Patchy ground-glass opacities LEFT lower lobe and, in the bilateral hila. Tracheobronchial tree is patent and midline, no pneumothorax.  Soft tissues and osseous structures: Large body  habitus. No suspicious bony lesions or acute osseous process.  CT ABDOMEN and PELVIS FINDINGS (respiratory motion degraded examination)  SOLID ORGANS: The liver, spleen,  gallbladder, pancreas and adrenal glands are unremarkable.  GASTROINTESTINAL TRACT: Moderate to large amount of distal retained large bowel stool. Gas distended colon up to 6.7 cm. Focal hepatic flexure pneumatosis. Scattered small bowel air-fluid levels. The stomach, small bowel are normal in course and caliber without inflammatory changes. The appendix is not discretely identified, however there are no inflammatory changes in the right lower quadrant.  KIDNEYS/ URINARY TRACT: Kidneys are orthotopic, demonstrating symmetric enhancement. 4 mm RIGHT upper pole, 3 mm RIGHT upper pole nephrolithiasis, 4 mm LEFT lower pole nephrolithiasis. No hydronephrosis or solid renal masses. The unopacified ureters are normal in course and caliber. Delayed imaging through the kidneys demonstrates symmetric prompt contrast excretion within the proximal urinary collecting system. Urinary bladder is partially distended, 4 mm calculus dependent in RIGHT bladder.  PERITONEUM/RETROPERITONEUM: Aortoiliac vessels are normal in course and caliber, mild calcific atherosclerosis. No lymphadenopathy by CT size criteria. Prostate is enlarged, invading the base the urinary bladder. No intraperitoneal free fluid nor free air.  SOFT TISSUE/OSSEOUS STRUCTURES: Non-suspicious. Moderate fat containing LEFT inguinal hernia. Severe degenerative change of the lumbar spine, grade 1 L4-5 anterolisthesis with multi level severe neural foraminal narrowing, severe canal stenosis L4-5, moderate to severe L2-3 and L3-4.  Review of the MIP images confirms the above findings.  IMPRESSION: CTA CHEST:  Limited CTA of the chest, poor bolus timing.  Moderate to severe cardiomegaly with small pericardial effusion. Evidence of pulmonary edema with small pleural effusions and, patchy airspace opacities most consistent with confluent edema.  CT ABDOMEN AND PELVIS: Focal colonic hepatic flexure pneumatosis without bowel perforation. Moderate to large amount of distal  retained large bowel stool. Mild probable ileus, no bowel obstruction.  Bilateral nonobstructing nephrolithiasis measuring up to 4 mm.   Electronically Signed   By: Awilda Metro M.D.   On: 06/18/2015 07:00   Dg Chest Port 1 View  06/19/2015   CLINICAL DATA:  Hypoxia  EXAM: PORTABLE CHEST - 1 VIEW  COMPARISON:  June 18, 2015  FINDINGS: Endotracheal tube tip is 1.7 cm above the carina. The left subclavian catheter tip is at the junction of the left innominate vein and superior vena cava, stable. Nasogastric tube tip and side port are below the diaphragm. No pneumothorax.  There is mild bibasilar atelectatic change. There is no frank edema or consolidation. Heart is mildly enlarged with pulmonary vascularity within normal limits. No adenopathy. Patient is status post coronary artery bypass grafting.  IMPRESSION: Tube and catheter positions as described without pneumothorax. Mild bibasilar atelectasis. No frank edema or consolidation. Heart prominent but stable.   Electronically Signed   By: Bretta Bang III M.D.   On: 06/19/2015 07:24   Dg Chest Port 1 View  06/18/2015   CLINICAL DATA:  Endotracheal tube placement.  Initial encounter.  EXAM: PORTABLE CHEST - 1 VIEW  COMPARISON:  Chest radiograph performed earlier today at 10:30 a.m.  FINDINGS: The patient's endotracheal tube is seen ending 1-2 cm above the carina. This could be retracted 1-2 cm. The enteric tube is seen extending below the diaphragm. A left IJ line is noted ending about the proximal to mid SVC.  The lungs are relatively well expanded. Mild vascular congestion is noted. Mild bibasilar opacities likely reflect atelectasis, though mild interstitial edema could have a similar appearance. No pleural effusion or pneumothorax is seen.  The cardiomediastinal  silhouette is enlarged. The patient is status post median sternotomy. An external pacing pad is noted. No acute osseous abnormalities are identified.  IMPRESSION: 1. Endotracheal tube seen  ending 1-2 cm above the carina. This could be retracted 1-2 cm. 2. Mild vascular congestion and cardiomegaly noted. Mild bibasilar opacities likely reflect atelectasis, though mild interstitial edema could have a similar appearance.   Electronically Signed   By: Roanna Raider M.D.   On: 06/18/2015 19:23   Dg Chest Port 1 View  06/18/2015   CLINICAL DATA:  Left central catheter placement  EXAM: PORTABLE CHEST - 1 VIEW  COMPARISON:  Chest radiograph and chest CT obtained earlier in the day  FINDINGS: Central catheter tip is in the superior vena cava just beyond the junction with the left innominate vein. Nasogastric tube tip and side port are below the diaphragm. No pneumothorax. There is cardiomegaly with mild pulmonary venous hypertension. Patient is status post coronary artery bypass grafting. There is interstitial edema in the mid to lower lung zones with patchy atelectasis on the left side. No adenopathy.  IMPRESSION: Suspect a degree of congestive heart failure. There is also atelectasis on the left. No pneumothorax. Tube and catheter positions as described.   Electronically Signed   By: Bretta Bang III M.D.   On: 06/18/2015 10:50   Dg Chest Port 1 View  06/18/2015   CLINICAL DATA:  Respiratory distress  EXAM: PORTABLE CHEST - 1 VIEW  COMPARISON:  06/12/2015  FINDINGS: There is marked cardiomegaly, unchanged. There are mild vascular and interstitial congestive changes, worsened from 06/12/2015. There is no large effusion. There is no dense airspace consolidation.  IMPRESSION: New/ worsened vascular and interstitial congestive changes. Marked unchanged cardiomegaly   Electronically Signed   By: Ellery Plunk M.D.   On: 06/18/2015 02:34    Anti-infectives: Anti-infectives    Start     Dose/Rate Route Frequency Ordered Stop   06/19/15 1200  vancomycin (VANCOCIN) 1,750 mg in sodium chloride 0.9 % 500 mL IVPB     1,750 mg 250 mL/hr over 120 Minutes Intravenous Every 24 hours 06/18/15 1030      06/18/15 1200  fluconazole (DIFLUCAN) IVPB 400 mg     400 mg 100 mL/hr over 120 Minutes Intravenous Every 24 hours 06/18/15 1050     06/18/15 1130  vancomycin (VANCOCIN) 2,500 mg in sodium chloride 0.9 % 500 mL IVPB     2,500 mg 250 mL/hr over 120 Minutes Intravenous  Once 06/18/15 1030 06/18/15 1314   06/18/15 1130  cefTAZidime (FORTAZ) 2 g in dextrose 5 % 50 mL IVPB     2 g 100 mL/hr over 30 Minutes Intravenous Every 12 hours 06/18/15 1030     06/18/15 1030  metroNIDAZOLE (FLAGYL) IVPB 500 mg     500 mg 100 mL/hr over 60 Minutes Intravenous Every 8 hours 06/18/15 1000        Assessment/Plan: POD 1 tac, open abdomen  1. Remain on vent, will need to return to or tomorrow for ileostomy and possible closure.  Will need 2 sponges removed also 2. Start tna 3. Cont abx 4. Appreciate ccm assistance.   [facility] 06/19/2015

## 2015-06-20 NOTE — Anesthesia Preprocedure Evaluation (Addendum)
Anesthesia Evaluation  Patient identified by MRN, date of birth, ID band Patient awake  General Assessment Comment:Pt intubated.  Reviewed: Unable to perform ROS - Chart review onlyPreop documentation limited or incomplete due to emergent nature of procedure.  Airway Mallampati: Intubated      Comment: Pt is intubated. Dental   Pulmonary sleep apnea ,  Respiratory failure.  Currently intubated. breath sounds clear to auscultation        Cardiovascular hypertension, + CAD, + Past MI, + CABG and +CHF + dysrhythmias Atrial Fibrillation Rhythm:irregular Rate:Normal  EF 25% on recent TTE.  Pt in septic shock due to ischemic bowel.  S/p CABG x1 month ago.   Neuro/Psych Anxiety    GI/Hepatic S/p ex-lap for ischemic bowel.  Open abdomen.   Endo/Other  Morbid obesity  Renal/GU      Musculoskeletal  (+) Arthritis -,   Abdominal   Peds  Hematology   Anesthesia Other Findings   Reproductive/Obstetrics                            Anesthesia Physical Anesthesia Plan  ASA: IV  Anesthesia Plan: General   Post-op Pain Management:    Induction: Intravenous  Airway Management Planned: Oral ETT  Additional Equipment: Arterial line  Intra-op Plan:   Post-operative Plan: Post-operative intubation/ventilation  Informed Consent: I have reviewed the patients History and Physical, chart, labs and discussed the procedure including the risks, benefits and alternatives for the proposed anesthesia with the patient or authorized representative who has indicated his/her understanding and acceptance.     Plan Discussed with: CRNA, Anesthesiologist and Surgeon  Anesthesia Plan Comments:         Anesthesia Quick Evaluation

## 2015-06-20 NOTE — Interval H&P Note (Signed)
History and Physical Interval Note:  06/20/2015 8:03 AM  Jonathan Le  has presented today for surgery, with the diagnosis of OPEN ABDOMEN  The various methods of treatment have been discussed with the patient and family. After consideration of risks, benefits and other options for treatment, the patient has consented to  Procedure(s): REOPENING LAPAROTOMY, ILEOSTOMY, POSSIBLE CLOSURE (N/A) as a surgical intervention .  The patient's history has been reviewed, patient examined, no change in status, stable for surgery.  I have reviewed the patient's chart and labs. I reviewed the surgery with the patient's family.     Nazar Kuan

## 2015-06-20 NOTE — Progress Notes (Signed)
eLink Physician-Brief Progress Note Patient Name: Jonathan Le DOB: [birth year] MRN: [id number]   Date of Service  06/20/2015  HPI/Events of Note  Patient s\p abdominal surgery closure, pack removed  eICU Interventions  Patient awake and following commands, wife at bedside.   Comfortable.      Intervention Category Intermediate Interventions: Other:  Belissa Kooy 06/20/2015, 10:13 PM

## 2015-06-20 NOTE — Op Note (Signed)
PRE-OPERATIVE DIAGNOSIS: open abdomen (prior ischemic bowel), bowel discontinuity  POST-OPERATIVE DIAGNOSIS:  Same  PROCEDURE:  Procedure(s): Exploratory laparotomy, removal of prior packs, closure of open abdomen, ileostomy  SURGEON:  Surgeon(s): Almond Lint, MD  ANESTHESIA:   general  DRAINS: none and wound vac for subcutaneous skin.     LOCAL MEDICATIONS USED:  NONE  SPECIMEN:  No Specimen  DISPOSITION OF SPECIMEN:  N/A  COUNTS:  YES  DICTATION: .Dragon Dictation  PLAN OF CARE: readmit to [facility]  PATIENT DISPOSITION:  ICU - intubated and hemodynamically stable.  FINDINGS:  No evidence of continued bowel ischemia. No murky fluid.  Abdominal wall pulled together easily.    EBL: min  PROCEDURE:  Patient was identified in the ICU and taken to the operating room directly where he was placed supine on operating room table. His external portion of his abdominal VAC pack was removed. His abdomen was prepped and draped in standard fashion. A timeout was performed according to the surgical safety checklist. When all was correct, we continued.  The internal portion of the wound VAC was removed and the abdomen was sequentially explored in all 4 quadrants as well as the pelvis. A laparotomy sponge was removed from the pelvis as well as one from the right upper quadrant near the duodenum. These had been left deliberately. There was no evidence of bleeding in this location. These were placed out side of the count for this case. The abdomen was then irrigated and the bowel was run from the ligament of Treitz to the end where it had been divided. There was no evidence of continued ischemia. The omentum was in good shape. The distal transverse colon and descending colon appeared also to be in good shape. The abdomen was irrigated with 5 L of saline.  The site for the ileostomy was selected in the right abdomen. A circular defect was made with the cautery. The anterior fascia was opened in  cruciate incision at the lateral portion of the rectus. The posterior fascia was entered with the cautery after spreading the muscle.  Care was taken to protect the underlying viscera with elevation of the abdominal wall and a sponge. The posterior fascia was also divided in cruciate fashion. The distal ileum was pulled through the abdominal wall and held with a Babcock. There was definitely adequate length pulled through the abdominal wall. The omentum was then pulled down over the top of the bowel and the fascia was closed with running looped #1 PDS sutures. The skin was irrigated and dressed with the subcutaneous wound VAC.   The ileostomy was then created by opening of the distal ileum and using 3-0 undyed Vicryl sutures. The inferior border of the ileostomy was brought. A two-piece ileostomy wafer was then placed. The patient was taken to the ICU intubated. He was hemodynamically stable with pressors at the same level as they were previously.   Needle, sponge, and instrument counts were correct 2.

## 2015-06-20 NOTE — Anesthesia Procedure Notes (Signed)
Date/Time: 06/20/2015 8:21 AM Performed by: Alanda Amass A Pre-anesthesia Checklist: Patient identified, Emergency Drugs available, Suction available, Patient being monitored and Timeout performed Patient Re-evaluated:Patient Re-evaluated prior to inductionOxygen Delivery Method: Circle system utilized Intubation Type: Inhalational induction with existing ETT

## 2015-06-20 NOTE — Transfer of Care (Signed)
Immediate Anesthesia Transfer of Care Note  Patient: Jonathan Le  Procedure(s) Performed: Procedure(s): WASHOUT OF ABDOMEN, CLOSURE OF ABDOMINAL WALL WITH PLACEMENT OF WOUND VAC AND ILLEOSTOMY  (N/A)  Patient Location: ICU  Anesthesia Type:General  Level of Consciousness: sedated, unresponsive and Patient remains intubated per anesthesia plan  Airway & Oxygen Therapy: Patient remains intubated per anesthesia plan and Patient placed on Ventilator (see vital sign flow sheet for setting)  Post-op Assessment: Report given to RN and Post -op Vital signs reviewed and stable  Post vital signs: Reviewed and stable  Last Vitals:  Filed Vitals:   06/20/15 0800  BP: 132/63  Pulse: 101  Temp:   Resp: 20    Complications: No apparent anesthesia complications

## 2015-06-20 NOTE — Progress Notes (Signed)
PULMONARY / CRITICAL CARE MEDICINE   Name: Jonathan Le MRN: [id number] DOB: [birth year]    ADMISSION DATE:  06/18/2015 CONSULTATION DATE: 06/18/15   CHIEF COMPLAINT:  Septic shock secondary to ischemic colon. Acute respiratiory failure. Acute renal failure.  INITIAL PRESENTATION: 69 year old male with PMH as below, which is significant for OSA and HTN. 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 He progressed and was discharged. Post discharge course complicated by R leg cellulitis after endoscopic vein harvest he was seen by Dr. Donata Clay in clinic for this 8/3 and was felt to be improving. No complaints noted at that time. Was in his USOH, but was having some constipation. 8/4 PM he took an enema and was straining in bathroom. He became diaphoretic and had loss of consciousness. Wife called EMS. In ED he was noted to be profoundly hypotensive with SOB. Found to be in severe septic shock with ischemic colon s/p total abd colectomy on 8/4  STUDIES:  Echo (8/4) estimated ejection fraction was in the range of 25% to 30%. Severe hypokinesis of the anteroseptal and anterior myocardium. Dyskinesis of the apical myocardium. .  CT chest abd pelvis (8/4) CTA CHEST: Limited CTA of the chest, poor bolus timing.  Moderate to severe cardiomegaly with small pericardial effusion.Evidence of pulmonary edema with small pleural effusions and, patchy airspace opacities most consistent with confluent edema.  CT ABDOMEN AND PELVIS: Focal colonic hepatic flexure pneumatosis without bowel perforation. Moderate to large amount of distal retained large bowel stool. Mild probable ileus, no bowel obstruction.  Bilateral nonobstructing nephrolithiasis measuring up to 4  mm.  SIGNIFICANT EVENTS: 8/4- Total abdominal colectomy with abdominal vac placement    SUBJECTIVE:  Denies pain , on fent gtt Wound vac Awake Low gr fever   VITAL SIGNS: Temp:  [97.6 F (36.4 C)-100.6 F (38.1 C)] 98.9 F (37.2 C) (08/06 0400) Pulse Rate:  [59-91] 78 (08/06 0723) Resp:  [10-25] 20 (08/06 0723) BP: (93-142)/(44-76) 107/58 mmHg (08/06 0723) SpO2:  [93 %-100 %] 99 % (08/06 0723) Arterial Line BP: (83-170)/(35-77) 111/57 mmHg (08/06 0600) FiO2 (%):  [40 %-50 %] 40 % (08/06 0723) Weight:  [134 kg (295 lb 6.7 oz)] 134 kg (295 lb 6.7 oz) (08/06 0500) HEMODYNAMICS:   VENTILATOR SETTINGS: Vent Mode:  [-] PRVC FiO2 (%):  [40 %-50 %] 40 % Set Rate:  [20 bmp] 20 bmp Vt Set:  [560 mL-620 mL] 560 mL PEEP:  [7 cmH20] 7 cmH20 Plateau Pressure:  [15 cmH20-21 cmH20] 17 cmH20 INTAKE / OUTPUT:  Intake/Output Summary (Last 24 hours) at 06/20/15 0729 Last data filed at 06/20/15 0600  Gross per 24 hour  Intake 6749.46 ml  Output   4035 ml  Net 2714.46 ml    PHYSICAL EXAMINATION: General: No apparent distress. On vent Neuro:awake, non focal HEENT: PERRL, no JVD noted Cardiovascular: Regular Rate and rhythm. No MRG Lungs: Clear breath sounds antr Abdomen: Soft, non-tender, surgical dressing in place  LABS:  CBC  Recent Labs Lab 06/18/15 0658  06/18/15 1815 06/18/15 1930 06/19/15 0555  WBC 18.9*  --   --  9.1 11.7*  HGB 15.7  < > 12.9* 14.0 14.3  HCT 45.4  < > 38.0* 40.2 40.5  PLT 311  --   --  252 242  < > = values in this interval not displayed. Coag's  Recent Labs Lab 06/18/15 1930 06/19/15 0555 06/20/15 0430  APTT  --  34 24  INR 1.39 1.51* 1.43   BMET  Recent Labs Lab 06/18/15 1930 06/19/15 0555 06/19/15 1245  NA 135 136 137  K 2.5* 3.0* 3.4*  CL 99* 103 104  CO2 20* 22 23  BUN 22* 21* 19  CREATININE 1.43* 1.03 0.89  GLUCOSE 177* 117* 130*   Electrolytes  Recent Labs Lab 06/18/15 0658  06/18/15 1930 06/18/15 2230  06/19/15 0555 06/19/15 1245 06/20/15 0430  CALCIUM  --   < > 7.9*  --  7.8* 7.8*  --   MG 1.8  --   --  1.7 1.6*  --  1.9  PHOS 5.7*  --   --   --  4.8*  --  1.9*  < > = values in this interval not displayed. Sepsis Markers  Recent Labs Lab 06/18/15 1050  06/19/15 0555 06/19/15 0703 06/19/15 1245 06/20/15 0430  LATICACIDVEN  --   < >  --  2.9* 2.4* 2.4*  PROCALCITON 25.47  --  32.23  --   --  15.44  < > = values in this interval not displayed. ABG  Recent Labs Lab 06/18/15 1815 06/18/15 2125 06/19/15 0651  PHART 7.210* 7.373 7.491*  PCO2ART 50.2* 37.5 31.3*  PO2ART 80.0 123.0* 132.0*   Liver Enzymes  Recent Labs Lab 06/18/15 0242 06/19/15 0555  AST 47* 82*  ALT 19 50  ALKPHOS 112 117  BILITOT 1.1 1.3*  ALBUMIN 3.1* 2.2*   Cardiac Enzymes  Recent Labs Lab 06/18/15 0242 06/19/15 1245 06/20/15 0430  TROPONINI 0.04* 0.11* 0.10*   Glucose  Recent Labs Lab 06/19/15 0902 06/19/15 1228 06/19/15 1711 06/19/15 2017 06/19/15 2346 06/20/15 0359  GLUCAP 108* 121* 136* 141* 154* 182*    Imaging Dg Chest Port 1 View  06/20/2015   CLINICAL DATA:  69 year old male with respiratory failure.  EXAM: PORTABLE CHEST - 1 VIEW  COMPARISON:  Chest x-ray 06/19/2015.  FINDINGS: An endotracheal tube is in place with tip 3.6 cm above the carina. A nasogastric tube is seen extending into the stomach, however, the tip of the nasogastric tube extends below the lower margin of the image. Lung volumes remain low. Persistent bibasilar opacities may reflect areas of atelectasis and/or consolidation. Left costophrenic sulcus is excluded. No definite right pleural effusion. Pulmonary venous congestion accentuated by low lung volumes, without frank pulmonary edema. Mild cardiomegaly is unchanged. Mediastinal contours are distorted by patient positioning. Atherosclerosis in the thoracic aorta. Status post median sternotomy for CABG.  IMPRESSION: 1. Low lung volumes with bibasilar areas of  atelectasis and/or consolidation. 2. Postoperative changes and support apparatus, as above.   Electronically Signed   By: Trudie Reed M.D.   On: 06/20/2015 07:09     ASSESSMENT / PLAN:  PULMONARY A: Acute resp failure, post -op ETT 8/4 >> Will need to continue vent support as he will go back to OR tomorrow.  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 Ct  vasopressin and try to wean the levaphed down. Equal balance  RENAL A:  AKI -resolved Hypophos P:   Continue IVF. Will switch to D5 NS with K @125 /hr Replete low K. Replete lytes as needed  GASTROINTESTINAL A:  s/p total colectomy for ischemic bowel Will return to OR for ileostomy and possible closure 8/6. P:   Ct TNA Protonix for SUP  HEMATOLOGIC A: Leucocytosis -improved today. P:  Transfusion only if HB <7  INFECTIOUS A:  Septic shock from ischemic bowel. All cultures are negative to date.  P:   Abx: Ceftaz 8/4>> Vanco 8/5>> 8/6 Flagyl 8/4>> Diflucan 8/4>>  Will be able to narrow depending on culture results.   ENDOCRINE A:  Hyperglycemia without h/o DM P: Cover with SSI   NEUROLOGIC A:  P:   RASS goal: -1  FAMILY  - Updates: Wife [name] updated 8/5 - Inter-disciplinary family meet or Palliative Care meeting due by: 8/12   Summary -  Ileostomy & possible c losure planned today. SBTs once off pressors. Expect long post op course.  The patient is critically ill with multiple organ systems failure and requires high complexity decision making for assessment and support, frequent evaluation and titration of therapies, application of advanced monitoring technologies and extensive interpretation of multiple databases. Critical Care Time devoted to patient care services described in this note independent of APP time is 35 minutes.    Cyril Mourning MD.  Tonny Bollman. [facility] Pager [phone] If no response call [phone]    06/20/2015, 7:29 AM

## 2015-06-21 ENCOUNTER — Inpatient Hospital Stay (HOSPITAL_COMMUNITY): Payer: Medicare Other

## 2015-06-21 DIAGNOSIS — L8990 Pressure ulcer of unspecified site, unspecified stage: Secondary | ICD-10-CM | POA: Insufficient documentation

## 2015-06-21 LAB — CBC
HCT: 36.5 % — ABNORMAL LOW (ref 39.0–52.0)
HEMOGLOBIN: 12.1 g/dL — AB (ref 13.0–17.0)
MCH: 28.5 pg (ref 26.0–34.0)
MCHC: 33.2 g/dL (ref 30.0–36.0)
MCV: 86.1 fL (ref 78.0–100.0)
PLATELETS: 160 10*3/uL (ref 150–400)
RBC: 4.24 MIL/uL (ref 4.22–5.81)
RDW: 14.9 % (ref 11.5–15.5)
WBC: 12.9 10*3/uL — ABNORMAL HIGH (ref 4.0–10.5)

## 2015-06-21 LAB — GLUCOSE, CAPILLARY
GLUCOSE-CAPILLARY: 139 mg/dL — AB (ref 65–99)
Glucose-Capillary: 145 mg/dL — ABNORMAL HIGH (ref 65–99)
Glucose-Capillary: 126 mg/dL — ABNORMAL HIGH (ref 65–99)
Glucose-Capillary: 123 mg/dL — ABNORMAL HIGH (ref 65–99)
Glucose-Capillary: 136 mg/dL — ABNORMAL HIGH (ref 65–99)

## 2015-06-21 LAB — COMPREHENSIVE METABOLIC PANEL
ALBUMIN: 1.7 g/dL — AB (ref 3.5–5.0)
ALT: 56 U/L (ref 17–63)
AST: 43 U/L — AB (ref 15–41)
Alkaline Phosphatase: 101 U/L (ref 38–126)
Anion gap: 8 (ref 5–15)
BILIRUBIN TOTAL: 1 mg/dL (ref 0.3–1.2)
BUN: 13 mg/dL (ref 6–20)
CHLORIDE: 106 mmol/L (ref 101–111)
CO2: 23 mmol/L (ref 22–32)
CREATININE: 0.59 mg/dL — AB (ref 0.61–1.24)
Calcium: 7.9 mg/dL — ABNORMAL LOW (ref 8.9–10.3)
GFR calc Af Amer: >60 mL/min (ref >60)
GLUCOSE: 164 mg/dL — AB (ref 65–99)
POTASSIUM: 3.5 mmol/L (ref 3.5–5.1)
SODIUM: 137 mmol/L (ref 135–145)
Total Protein: 4.1 g/dL — ABNORMAL LOW (ref 6.5–8.1)

## 2015-06-21 LAB — MAGNESIUM: MAGNESIUM: 1.6 mg/dL — AB (ref 1.7–2.4)

## 2015-06-21 LAB — APTT: aPTT: 30 seconds (ref 24–37)

## 2015-06-21 LAB — PROTIME-INR
INR: 1.25 (ref 0.00–1.49)
Prothrombin Time: 15.9 seconds — ABNORMAL HIGH (ref 11.6–15.2)

## 2015-06-21 LAB — PHOSPHORUS: PHOSPHORUS: 1.9 mg/dL — AB (ref 2.5–4.6)

## 2015-06-21 MED ORDER — MAGNESIUM SULFATE 2 GM/50ML IV SOLN
2.0000 g | Freq: Once | INTRAVENOUS | Status: AC
  Administered 2015-06-21: 2 g via INTRAVENOUS
  Filled 2015-06-21: qty 50

## 2015-06-21 MED ORDER — POTASSIUM CHLORIDE 10 MEQ/50ML IV SOLN: 10.0000 meq | INTRAVENOUS | Status: DC

## 2015-06-21 MED ORDER — POTASSIUM CHLORIDE 10 MEQ/50ML IV SOLN
10.0000 meq | Freq: Once | INTRAVENOUS | Status: AC
  Administered 2015-06-21: 10 meq via INTRAVENOUS
  Filled 2015-06-21: qty 50

## 2015-06-21 MED ORDER — SODIUM CHLORIDE 0.9 % IV BOLUS (SEPSIS)
1000.0000 mL | Freq: Once | INTRAVENOUS | Status: AC
  Administered 2015-06-21: 1000 mL via INTRAVENOUS

## 2015-06-21 MED ORDER — DEXTROSE 5 % IV SOLN
20.0000 mmol | Freq: Once | INTRAVENOUS | Status: AC
  Administered 2015-06-21: 20 mmol via INTRAVENOUS
  Filled 2015-06-21: qty 6.67

## 2015-06-21 MED ORDER — M.V.I. ADULT IV INJ
INJECTION | INTRAVENOUS | Status: AC
  Administered 2015-06-21: 17:00:00 via INTRAVENOUS
  Filled 2015-06-21: qty 960

## 2015-06-21 MED ORDER — POTASSIUM CHLORIDE 10 MEQ/50ML IV SOLN
10.0000 meq | INTRAVENOUS | Status: AC
  Administered 2015-06-21 (×2): 10 meq via INTRAVENOUS
  Filled 2015-06-21 (×2): qty 50

## 2015-06-21 NOTE — Progress Notes (Signed)
Chaplain Note: Patient's wife approached me after she watched me working with another family in surgical waiting.  She talked about how distraught she was when her husband coded on 27-Mar-2023. She raved about the excellent care she received from the "tall, slender African American" chaplain and wanted to know his name.  Provided emotional support and encouragement.  Chaplain follow up requested.  Rutherford Nail, MDiv. Chaplain

## 2015-06-21 NOTE — Progress Notes (Signed)
PARENTERAL NUTRITION CONSULT NOTE - FOLLOW UP  Pharmacy Consult for TPN Indication: prolonged ileus  Allergies  Allergen Reactions  . Penicillins Hives  . Doxycycline Rash  . Levaquin [Levofloxacin In D5w] Nausea Only and Other (See Comments)    Dizziness     Patient Measurements: Height: 5\' 9"  (175.3 cm) Weight: 298 lb 8.1 oz (135.4 kg) IBW/kg (Calculated) : 70.7   Vital Signs: Temp: 98.8 F (37.1 C) (08/07 0729) Temp Source: Oral (08/07 0729) BP: 154/74 mmHg (08/07 0800) Pulse Rate: 53 (08/07 0800) Intake/Output from previous day: 08/06 0701 - 08/07 0700 In: 5341.2 [I.V.:2961.2; NG/GT:120; IV Piggyback:1300; TPN:960] Out: 2401 [Urine:1660; Emesis/NG output:330; Drains:100; Stool:301; Blood:10] Intake/Output from this shift: Total I/O In: 239.4 [I.V.:119.4; NG/GT:30; IV Piggyback:50; TPN:40] Out: 225 [Urine:175; Drains:50]  Labs:  Recent Labs  06/19/15 0555 06/20/15 0430 06/20/15 1210 06/21/15 0350  WBC 11.7* 8.9 10.1 12.9*  HGB 14.3 12.1* 11.8* 12.1*  HCT 40.5 36.3* 35.6* 36.5*  PLT 242 187 169 160  APTT 34 24  --  30  INR 1.51* 1.43  --  1.25     Recent Labs  06/19/15 0555 06/19/15 1245 06/20/15 0430 06/20/15 0500 06/20/15 0555 06/21/15 0350  NA 136 137  --   --  136 137  K 3.0* 3.4*  --   --  3.1* 3.5  CL 103 104  --   --  105 106  CO2 22 23  --   --  23 23  GLUCOSE 117* 130*  --   --  173* 164*  BUN 21* 19  --   --  17 13  CREATININE 1.03 0.89  --   --  0.68 0.59*  CALCIUM 7.8* 7.8*  --   --  7.7* 7.9*  MG 1.6*  --  1.9  --   --  1.6*  PHOS 4.8*  --  1.9*  --   --  1.9*  PROT 4.7*  --   --   --  4.6* 4.1*  ALBUMIN 2.2*  --   --   --  1.8* 1.7*  AST 82*  --   --   --  74* 43*  ALT 50  --   --   --  63 56  ALKPHOS 117  --   --   --  109 101  BILITOT 1.3*  --   --   --  0.8 1.0  PREALBUMIN  --   --  5.2*  --   --   --   TRIG  --   --   --  69  --   --    Estimated Creatinine Clearance: 119.1 mL/min (by C-G formula based on Cr of 0.59).     Recent Labs  06/20/15 2313 06/21/15 0352 06/21/15 0727  GLUCAP 142* 139* 145*    Medications:  Anti-infectives    Start     Dose/Rate Route Frequency Ordered Stop   06/20/15 2200  cefTAZidime (FORTAZ) 2 g in dextrose 5 % 50 mL IVPB     2 g 100 mL/hr over 30 Minutes Intravenous 3 times per day 06/20/15 1537     06/20/15 0200  vancomycin (VANCOCIN) IVPB 1000 mg/200 mL premix  Status:  Discontinued     1,000 mg 200 mL/hr over 60 Minutes Intravenous Every 12 hours 06/19/15 1512 06/20/15 0737   06/19/15 1200  vancomycin (VANCOCIN) 1,750 mg in sodium chloride 0.9 % 500 mL IVPB  Status:  Discontinued     1,750 mg  250 mL/hr over 120 Minutes Intravenous Every 24 hours 06/18/15 1030 06/19/15 1512   06/18/15 1200  fluconazole (DIFLUCAN) IVPB 400 mg     400 mg 100 mL/hr over 120 Minutes Intravenous Every 24 hours 06/18/15 1050     06/18/15 1130  vancomycin (VANCOCIN) 2,500 mg in sodium chloride 0.9 % 500 mL IVPB     2,500 mg 250 mL/hr over 120 Minutes Intravenous  Once 06/18/15 1030 06/18/15 1314   06/18/15 1130  cefTAZidime (FORTAZ) 2 g in dextrose 5 % 50 mL IVPB  Status:  Discontinued     2 g 100 mL/hr over 30 Minutes Intravenous Every 12 hours 06/18/15 1030 06/20/15 1537   06/18/15 1030  metroNIDAZOLE (FLAGYL) IVPB 500 mg     500 mg 100 mL/hr over 60 Minutes Intravenous Every 8 hours 06/18/15 1000        Insulin Requirements in the past 24 hours:  10 units SSI  Current Nutrition:  NPO  Assessment: 69 y/o morbidly obese M recently under went CABG 05/22/2015 with post-op R leg cellulitis, afib. He came to ED 8/4 after losing consciousness on toilet. Pt was hypotensive. K=2.5.  Surgeries/Procedures: 8 8/4: Total abdominal colectomy 8/6: return to OR for ileostomy and abdominal closure  GI: Morbid obesity, LBM 06/18/15, IV PPI; TG 69; Prealbumin 5.2 Endo: no h/o DM. CBGs have been 142-164/24h on SSI Renal: Scr down 0.59. UOP 0.5, I/O +2946. Phos was elevated 4.8, now 1.9; Mg  1.6, K 3.5. Will need electrolytes to stabilize before advancing to goal TPN rate.   Pulm: OSA, VDRF on fentanyl inf Cards: HTN, 8/4 EF 35-40%, Septic shock. Levophed & Vasopressin Hepatobil: INR 1.25. AST elevated 43. Tbili 1 slightly elevated Neuro: Anxiety. Sedated on Fent ID:WBC 12.9, PCT 15.4. Septic shock secondary to ischemic colon. Afebrile. Fortaz/Flagyl/Vanco/Fluconazole Best Practices: MC, SQ heparin (CBC WNL), IV PPI TPN Access: 8/4 Triple lumen CVC TPN start date: 06/19/2015, IV PPI   Nutritional Goals:  -1430-1820 kCal, 145-155 grams of protein per day -Goal Clinimix (E) 5/15 at 112ml/hr would provide 120g protein and 1704 kcal/day (this does not include IV fat emulsion)  Plan:  - Continue Clinimix E 5/15 at 40 ml/hr.  Evaluate 8/8 for rate advancement if K and Phos are replaced and stable. - No IVFE for the first 7 days of TPN while in the ICU per ASPEN/SCCM guidelines (D#3).  - Give KPhos IV x 1  - Give KCL x 1 run - for a total of 3 runs today (2 runs ordered per CCM electrolyte protocol this morning).  This will provide a total of K today (KPhos provides 29 mEq K).  - Give Magnesium 2gm IV x 1  - Maintenance IV fluids were decreased by Dr. Vassie Loll on 8/6 - will not make further adjustments to rate today since TPN rate is not changing. - Daily multivitamin and trace elements - TPN labs qMon/Thurs  Estella Husk, Pharm.D., BCPS, AAHIVP Clinical Pharmacist Phone: [phone] or [phone] 06/21/2015, 8:52 AM

## 2015-06-21 NOTE — Progress Notes (Signed)
[facility] ICU Electrolyte Replacement Protocol  Patient Name: TANNAR BROKER DOB: 1946/06/05 MRN: [id number]  Date of Service  06/21/2015   HPI/Events of Note    Recent Labs Lab 06/18/15 0658  06/18/15 1930 06/18/15 2230 06/19/15 0555 06/19/15 1245 06/20/15 0430 06/20/15 0555 06/21/15 0350  NA  --   < > 135  --  136 137  --  136 137  K  --   < > 2.5*  --  3.0* 3.4*  --  3.1* 3.5  CL  --   < > 99*  --  103 104  --  105 106  CO2  --   < > 20*  --  22 23  --  23 23  GLUCOSE  --   < > 177*  --  117* 130*  --  173* 164*  BUN  --   < > 22*  --  21* 19  --  17 13  CREATININE  --   < > 1.43*  --  1.03 0.89  --  0.68 0.59*  CALCIUM  --   < > 7.9*  --  7.8* 7.8*  --  7.7* 7.9*  MG 1.8  --   --  1.7 1.6*  --  1.9  --  1.6*  PHOS 5.7*  --   --   --  4.8*  --  1.9*  --  1.9*  < > = values in this interval not displayed.  Estimated Creatinine Clearance: 119.1 mL/min (by C-G formula based on Cr of 0.59).  Intake/Output      08/06 0701 - 08/07 0700   I.V. (mL/kg) 2841.8 (21)   NG/GT 120   IV Piggyback 1250   TPN 920   Total Intake(mL/kg) 5131.8 (37.9)   Urine (mL/kg/hr) 1660 (0.5)   Emesis/NG output 330 (0.1)   Drains 100 (0)   Stool 1 (0)   Blood 10 (0)   Total Output 2101   Net +3030.8       Stool Occurrence 3 x    - I/O DETAILED x24h    Total I/O In: 1944.7 [I.V.:1324.7; NG/GT:30; IV Piggyback:150; TPN:440] Out: 770 [Urine:640; Emesis/NG output:130] - I/O THIS SHIFT    ASSESSMENT   eICURN Interventions  ICU Electrolyte Replacement Protocol criteria met. Labs Replaced per protocol. MD notified   ASSESSMENT: MAJOR ELECTROLYTE    [name] 06/21/2015, 6:14 AM

## 2015-06-21 NOTE — Plan of Care (Signed)
Problem: Phase I Progression Outcomes Goal: Incision/dressings dry and intact Outcome: Completed/Met Date Met:  06/21/15 Wound vac on abdominal incision Goal: Voiding-avoid urinary catheter unless indicated Outcome: Not Applicable Date Met:  06/21/15 Foley placed for patient during initial surgery Goal: Vital signs/hemodynamically stable Outcome: Progressing Patient remains on IV vasopressors     

## 2015-06-21 NOTE — Plan of Care (Signed)
Problem: Consults Goal: Nutrition Consult-if indicated Outcome: Completed/Met Date Met:  06/21/15 TPN managed by pharmacy  Problem: Phase I Progression Outcomes Goal: VTE prophylaxis Outcome: Completed/Met Date Met:  06/21/15 SCDs, Heparin SQ Goal: GIProphysixis Outcome: Completed/Met Date Met:  06/21/15 NGT, protonix IV Goal: Pain controlled with appropriate interventions Outcome: Completed/Met Date Met:  06/21/15 Fentanyl gtt Goal: Patient tolerating weaning plan Outcome: 17 Weaned for 3 hours 8/6. Weaning currently 8/7

## 2015-06-21 NOTE — Progress Notes (Signed)
1 Day Post-Op  Subjective: Intubated and sedated on vent  Objective: Vital signs in last 24 hours: Temp:  [97 F (36.1 C)-101.5 F (38.6 C)] 98.8 F (37.1 C) (08/07 0729) Pulse Rate:  [34-116] 34 (08/07 1000) Resp:  [9-28] 15 (08/07 1000) BP: (80-155)/(42-88) 92/55 mmHg (08/07 1000) SpO2:  [87 %-100 %] 96 % (08/07 1000) Arterial Line BP: (74-169)/(36-93) 75/69 mmHg (08/07 1000) FiO2 (%):  [40 %] 40 % (08/07 0800) Weight:  [135.4 kg (298 lb 8.1 oz)] 135.4 kg (298 lb 8.1 oz) (08/07 0600) Last BM Date: 06/21/15  Intake/Output from previous day: 08/06 0701 - 08/07 0700 In: 5341.2 [I.V.:2961.2; NG/GT:120; IV Piggyback:1300; TPN:960] Out: 2401 [Urine:1660; Emesis/NG output:330; Drains:100; Stool:301; Blood:10] Intake/Output this shift: Total I/O In: 603.8 [I.V.:243.8; NG/GT:30; IV Piggyback:250; TPN:80] Out: 340 [Urine:290; Drains:50]  Resp: clear to auscultation bilaterally Cardio: regular rate and rhythm GI: soft, nontender. subq vac in place. ostomy pink with some output  Lab Results:   Recent Labs  06/20/15 1210 06/21/15 0350  WBC 10.1 12.9*  HGB 11.8* 12.1*  HCT 35.6* 36.5*  PLT 169 160   BMET  Recent Labs  06/20/15 0555 06/21/15 0350  NA 136 137  K 3.1* 3.5  CL 105 106  CO2 23 23  GLUCOSE 173* 164*  BUN 17 13  CREATININE 0.68 0.59*  CALCIUM 7.7* 7.9*   PT/INR  Recent Labs  06/20/15 0430 06/21/15 0350  LABPROT 17.5* 15.9*  INR 1.43 1.25   ABG  Recent Labs  06/18/15 2125 06/19/15 0651  PHART 7.373 7.491*  HCO3 21.8 23.9    Studies/Results: Dg Chest Port 1 View  06/21/2015   CLINICAL DATA:  Acute respiratory failure.  EXAM: PORTABLE CHEST - 1 VIEW  COMPARISON:  06/20/2015 and 06/19/2015.  FINDINGS: 0515 hr. The endotracheal tube, nasogastric tube and left IJ central venous catheter appear unchanged. There is stable cardiomegaly status post median sternotomy and CABG. Vascular congestion, bibasilar airspace opacities and probable small  bilateral pleural effusions are unchanged.  IMPRESSION: Stable examination with persistent basilar airspace opacities and probable pleural effusions. Stable support system.   Electronically Signed   By: Carey Bullocks M.D.   On: 06/21/2015 07:09   Dg Chest Port 1 View  06/20/2015   CLINICAL DATA:  69 year old male with respiratory failure.  EXAM: PORTABLE CHEST - 1 VIEW  COMPARISON:  Chest x-ray 06/19/2015.  FINDINGS: An endotracheal tube is in place with tip 3.6 cm above the carina. A nasogastric tube is seen extending into the stomach, however, the tip of the nasogastric tube extends below the lower margin of the image. Lung volumes remain low. Persistent bibasilar opacities may reflect areas of atelectasis and/or consolidation. Left costophrenic sulcus is excluded. No definite right pleural effusion. Pulmonary venous congestion accentuated by low lung volumes, without frank pulmonary edema. Mild cardiomegaly is unchanged. Mediastinal contours are distorted by patient positioning. Atherosclerosis in the thoracic aorta. Status post median sternotomy for CABG.  IMPRESSION: 1. Low lung volumes with bibasilar areas of atelectasis and/or consolidation. 2. Postoperative changes and support apparatus, as above.   Electronically Signed   By: Trudie Reed M.D.   On: 06/20/2015 07:09    Anti-infectives: Anti-infectives    Start     Dose/Rate Route Frequency Ordered Stop   06/20/15 2200  cefTAZidime (FORTAZ) 2 g in dextrose 5 % 50 mL IVPB     2 g 100 mL/hr over 30 Minutes Intravenous 3 times per day 06/20/15 1537     06/20/15 0200  vancomycin (VANCOCIN) IVPB 1000 mg/200 mL premix  Status:  Discontinued     1,000 mg 200 mL/hr over 60 Minutes Intravenous Every 12 hours 06/19/15 1512 06/20/15 0737   06/19/15 1200  vancomycin (VANCOCIN) 1,750 mg in sodium chloride 0.9 % 500 mL IVPB  Status:  Discontinued     1,750 mg 250 mL/hr over 120 Minutes Intravenous Every 24 hours 06/18/15 1030 06/19/15 1512    06/18/15 1200  fluconazole (DIFLUCAN) IVPB 400 mg     400 mg 100 mL/hr over 120 Minutes Intravenous Every 24 hours 06/18/15 1050     06/18/15 1130  vancomycin (VANCOCIN) 2,500 mg in sodium chloride 0.9 % 500 mL IVPB     2,500 mg 250 mL/hr over 120 Minutes Intravenous  Once 06/18/15 1030 06/18/15 1314   06/18/15 1130  cefTAZidime (FORTAZ) 2 g in dextrose 5 % 50 mL IVPB  Status:  Discontinued     2 g 100 mL/hr over 30 Minutes Intravenous Every 12 hours 06/18/15 1030 06/20/15 1537   06/18/15 1030  metroNIDAZOLE (FLAGYL) IVPB 500 mg     500 mg 100 mL/hr over 60 Minutes Intravenous Every 8 hours 06/18/15 1000        Assessment/Plan: s/p Procedure(s): WASHOUT OF ABDOMEN, CLOSURE OF ABDOMINAL WALL WITH PLACEMENT OF WOUND VAC AND ILLEOSTOMY  (N/A) VDRF. Not able to wean vent yet. Continue support  Continue Vanc/Fortaz/Diflucan day 2 Still showing signs of sepsis with low bp requiring pressors but has been stable. Will give fluid bolus and try to wean pressors as he tolerates  LOS: 3 days    TOTH III,Zoriah Pulice S 06/21/2015

## 2015-06-21 NOTE — Progress Notes (Signed)
PULMONARY / CRITICAL CARE MEDICINE   Name: Jonathan Le MRN: [id number] DOB: 09-May-1946    ADMISSION DATE:  06/18/2015 CONSULTATION DATE: 06/18/15   CHIEF COMPLAINT:  Septic shock secondary to ischemic colon. Acute respiratiory failure. Acute renal failure.  INITIAL PRESENTATION: 69 year old male with PMH as below, which is significant for OSA and HTN. 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 He progressed and was discharged. Post discharge course complicated by R leg cellulitis after endoscopic vein harvest he was seen by Dr. Donata Clay in clinic for this 8/3 and was felt to be improving. No complaints noted at that time. Was in his USOH, but was having some constipation. 8/4 PM he took an enema and was straining in bathroom. He became diaphoretic and had loss of consciousness. Wife called EMS. In ED he was noted to be profoundly hypotensive with SOB. Found to be in severe septic shock with ischemic colon s/p total abd colectomy on 8/4  STUDIES:  Echo (8/4) estimated ejection fraction was in the range of 25% to 30%. Severe hypokinesis of the anteroseptal and anterior myocardium. Dyskinesis of the apical myocardium. .  CT chest abd pelvis (8/4) CTA CHEST: Limited CTA of the chest, poor bolus timing.  Moderate to severe cardiomegaly with small pericardial effusion.Evidence of pulmonary edema with small pleural effusions and, patchy airspace opacities most consistent with confluent edema.  CT ABDOMEN AND PELVIS: Focal colonic hepatic flexure pneumatosis without bowel perforation. Moderate to large amount of distal retained large bowel stool. Mild probable ileus, no bowel obstruction.  Bilateral nonobstructing nephrolithiasis measuring up to 4  mm.  SIGNIFICANT EVENTS: 8/4- Total abdominal colectomy with abdominal vac placement 8/6 Exploratory laparotomy, removal of prior packs, closure of open abdomen, ileostomy    SUBJECTIVE:   More anxious and agitated the same , on fent gtt Wound vac Febrile overnight Remains on pressors   VITAL SIGNS: Temp:  [97 F (36.1 C)-101.5 F (38.6 C)] 98.8 F (37.1 C) (08/07 0729) Pulse Rate:  [34-116] 77 (08/07 1114) Resp:  [9-28] 20 (08/07 1114) BP: (80-155)/(42-88) 88/53 mmHg (08/07 1114) SpO2:  [87 %-100 %] 96 % (08/07 1114) Arterial Line BP: (74-169)/(36-93) 94/46 mmHg (08/07 1100) FiO2 (%):  [40 %] 40 % (08/07 1114) Weight:  [135.4 kg (298 lb 8.1 oz)] 135.4 kg (298 lb 8.1 oz) (08/07 0600) HEMODYNAMICS:   VENTILATOR SETTINGS: Vent Mode:  [-] CPAP;PSV FiO2 (%):  [40 %] 40 % Set Rate:  [20 bmp] 20 bmp Vt Set:  [560 mL] 560 mL PEEP:  [5 cmH20-7 cmH20] 5 cmH20 Pressure Support:  [10 cmH20] 10 cmH20 Plateau Pressure:  [19 cmH20-23 cmH20] 23 cmH20 INTAKE / OUTPUT:  Intake/Output Summary (Last 24 hours) at 06/21/15 1158 Last data filed at 06/21/15 1105  Gross per 24 hour  Intake 5903.39 ml  Output   2211 ml  Net 3692.39 ml    PHYSICAL EXAMINATION: General:mild distress. On vent Neuro: Sedated, non focal HEENT: PERRL, no JVD noted Cardiovascular: Regular Rate and rhythm. No MRG Lungs: Decreased breath sounds antr Abdomen: Soft, non-tender, surgical dressing in place 1+ edema  LABS:  CBC  Recent Labs Lab 06/20/15 0430 06/20/15 1210 06/21/15 0350  WBC 8.9 10.1 12.9*  HGB 12.1* 11.8* 12.1*  HCT 36.3*  35.6* 36.5*  PLT 187 169 160   Coag's  Recent Labs Lab 06/19/15 0555 06/20/15 0430 06/21/15 0350  APTT 34 24 30  INR 1.51* 1.43 1.25   BMET  Recent Labs Lab 06/19/15 1245 06/20/15 0555 06/21/15 0350  NA 137 136 137  K 3.4* 3.1* 3.5  CL 104 105 106  CO2 BUN CREATININE 0.89 0.68 0.59*  GLUCOSE 130* 173* 164*    Electrolytes  Recent Labs Lab 06/19/15 0555 06/19/15 1245 06/20/15 0430 06/20/15 0555 06/21/15 0350  CALCIUM 7.8* 7.8*  --  7.7* 7.9*  MG 1.6*  --  1.9  --  1.6*  PHOS 4.8*  --  1.9*  --  1.9*   Sepsis Markers  Recent Labs Lab 06/18/15 1050  06/19/15 0555 06/19/15 0703 06/19/15 1245 06/20/15 0430  LATICACIDVEN  --   < >  --  2.9* 2.4* 2.4*  PROCALCITON 25.47  --  32.23  --   --  15.44  < > = values in this interval not displayed. ABG  Recent Labs Lab 06/18/15 1815 06/18/15 2125 06/19/15 0651  PHART 7.210* 7.373 7.491*  PCO2ART 50.2* 37.5 31.3*  PO2ART 80.0 123.0* 132.0*   Liver Enzymes  Recent Labs Lab 06/19/15 0555 06/20/15 0555 06/21/15 0350  AST 82* 74* 43*  ALT 50 63 56  ALKPHOS 117 109 101  BILITOT 1.3* 0.8 1.0  ALBUMIN 2.2* 1.8* 1.7*   Cardiac Enzymes  Recent Labs Lab 06/18/15 0242 06/19/15 1245 06/20/15 0430  TROPONINI 0.04* 0.11* 0.10*   Glucose  Recent Labs Lab 06/20/15 1244 06/20/15 1543 06/20/15 1959 06/20/15 2313 06/21/15 0352 06/21/15 0727  GLUCAP 150* 145* 138* 142* 139* 145*    Imaging Dg Chest Port 1 View  06/21/2015   CLINICAL DATA:  Acute respiratory failure.  EXAM: PORTABLE CHEST - 1 VIEW  COMPARISON:  06/20/2015 and 06/19/2015.  FINDINGS: 0515 hr. The endotracheal tube, nasogastric tube and left IJ central venous catheter appear unchanged. There is stable cardiomegaly status post median sternotomy and CABG. Vascular congestion, bibasilar airspace opacities and probable small bilateral pleural effusions are unchanged.  IMPRESSION: Stable examination with persistent basilar airspace opacities and probable pleural effusions. Stable support system.   Electronically Signed   By: Carey Bullocks M.D.   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  Replete lytes as needed  GASTROINTESTINAL A:  s/p total colectomy for ischemic bowel Will return to OR for ileostomy and possible closure 8/6. P:   Ct TNA Protonix for SUP  HEMATOLOGIC A: Leucocytosis -improved today. P:  Transfusion only if HB <7  INFECTIOUS A:  Septic shock from ischemic bowel. All cultures are negative to date.  P:   Abx: Ceftaz 8/4>> Vanco 8/5>> 8/6 Flagyl 8/4>> Diflucan 8/4>>  Will be able to narrow depending on culture results.   ENDOCRINE A:  Hyperglycemia without h/o DM P: Cover with SSI   NEUROLOGIC A:  P:   RASS goal: -1 fent gtt Int versed   FAMILY  - Updates: Wife Jonathan Le updated 8/5,  sister 8/7 - Inter-disciplinary family meet or Palliative Care meeting due by: 8/12   Summary - SBTs once off pressors. Expect long post op course but hopeful of liberating him from the vent  The patient is critically ill with multiple organ systems failure and requires high complexity decision making for assessment and support, frequent evaluation and titration of therapies, application of advanced monitoring technologies and extensive interpretation of multiple databases. Critical Care Time devoted to patient care services described in this note independent of APP time is 32 minutes.    Cyril Mourning MD. Tonny Bollman. [facility] Pager [phone] If no response call [phone]    06/21/2015, 11:58 AM

## 2015-06-22 ENCOUNTER — Encounter (HOSPITAL_COMMUNITY): Payer: Self-pay | Admitting: General Surgery

## 2015-06-22 ENCOUNTER — Inpatient Hospital Stay (HOSPITAL_COMMUNITY): Payer: Medicare Other

## 2015-06-22 LAB — COMPREHENSIVE METABOLIC PANEL
ALK PHOS: 80 U/L (ref 38–126)
ALT: 36 U/L (ref 17–63)
AST: 22 U/L (ref 15–41)
Albumin: 1.5 g/dL — ABNORMAL LOW (ref 3.5–5.0)
Anion gap: 6 (ref 5–15)
BILIRUBIN TOTAL: 0.9 mg/dL (ref 0.3–1.2)
BUN: 11 mg/dL (ref 6–20)
CHLORIDE: 107 mmol/L (ref 101–111)
CO2: 26 mmol/L (ref 22–32)
Calcium: 7.9 mg/dL — ABNORMAL LOW (ref 8.9–10.3)
Creatinine, Ser: 0.5 mg/dL — ABNORMAL LOW (ref 0.61–1.24)
GFR calc non Af Amer: >60 mL/min (ref >60)
Glucose, Bld: 138 mg/dL — ABNORMAL HIGH (ref 65–99)
Potassium: 3.8 mmol/L (ref 3.5–5.1)
Sodium: 139 mmol/L (ref 135–145)
TOTAL PROTEIN: 4.1 g/dL — AB (ref 6.5–8.1)

## 2015-06-22 LAB — APTT: APTT: 31 s (ref 24–37)

## 2015-06-22 LAB — GLUCOSE, CAPILLARY
GLUCOSE-CAPILLARY: 145 mg/dL — AB (ref 65–99)
Glucose-Capillary: 128 mg/dL — ABNORMAL HIGH (ref 65–99)
Glucose-Capillary: 110 mg/dL — ABNORMAL HIGH (ref 65–99)
Glucose-Capillary: 128 mg/dL — ABNORMAL HIGH (ref 65–99)
Glucose-Capillary: 122 mg/dL — ABNORMAL HIGH (ref 65–99)
Glucose-Capillary: 139 mg/dL — ABNORMAL HIGH (ref 65–99)

## 2015-06-22 LAB — TYPE AND SCREEN
ABO/RH(D): O POS
Antibody Screen: NEGATIVE
UNIT DIVISION: 0
UNIT DIVISION: 0
UNIT DIVISION: 0
Unit division: 0
Unit division: 0
Unit division: 0

## 2015-06-22 LAB — POCT I-STAT 3, ART BLOOD GAS (G3+)
Acid-base deficit: 3 mmol/L — ABNORMAL HIGH (ref 0.0–2.0)
Bicarbonate: 23.1 mEq/L (ref 20.0–24.0)
O2 SAT: 93 %
PH ART: 7.349 — AB (ref 7.350–7.450)
Patient temperature: 97.8
TCO2: 24 mmol/L (ref 0–100)
pCO2 arterial: 41.7 mmHg (ref 35.0–45.0)
pO2, Arterial: 70 mmHg — ABNORMAL LOW (ref 80.0–100.0)

## 2015-06-22 LAB — PROTIME-INR
INR: 1.37 (ref 0.00–1.49)
PROTHROMBIN TIME: 17 s — AB (ref 11.6–15.2)

## 2015-06-22 LAB — TRIGLYCERIDES: Triglycerides: 53 mg/dL (ref <150)

## 2015-06-22 LAB — PHOSPHORUS: PHOSPHORUS: 2.3 mg/dL — AB (ref 2.5–4.6)

## 2015-06-22 LAB — PREALBUMIN: Prealbumin: 2.8 mg/dL — ABNORMAL LOW (ref 18–38)

## 2015-06-22 LAB — MAGNESIUM: MAGNESIUM: 1.6 mg/dL — AB (ref 1.7–2.4)

## 2015-06-22 MED ORDER — MAGNESIUM SULFATE 2 GM/50ML IV SOLN
2.0000 g | Freq: Once | INTRAVENOUS | Status: AC
  Administered 2015-06-22: 2 g via INTRAVENOUS
  Filled 2015-06-22 (×3): qty 50

## 2015-06-22 MED ORDER — POTASSIUM CHLORIDE 10 MEQ/50ML IV SOLN
10.0000 meq | INTRAVENOUS | Status: AC
Start: 2015-06-22 — End: 2015-06-22
  Administered 2015-06-22 (×2): 10 meq via INTRAVENOUS
  Filled 2015-06-22: qty 50

## 2015-06-22 MED ORDER — CHLORHEXIDINE GLUCONATE 0.12 % MT SOLN
OROMUCOSAL | Status: AC
  Filled 2015-06-22: qty 15

## 2015-06-22 MED ORDER — POTASSIUM PHOSPHATES 15 MMOLE/5ML IV SOLN
20.0000 mmol | Freq: Once | INTRAVENOUS | Status: AC
  Administered 2015-06-22: 20 mmol via INTRAVENOUS
  Filled 2015-06-22: qty 6.67

## 2015-06-22 MED ORDER — TRACE MINERALS CR-CU-MN-SE-ZN 10-1000-500-60 MCG/ML IV SOLN
INTRAVENOUS | Status: AC
  Administered 2015-06-22: 18:00:00 via INTRAVENOUS
  Filled 2015-06-22: qty 960

## 2015-06-22 NOTE — Care Management Important Message (Signed)
Important Message  Patient Details  Name: Jonathan Le MRN: [id number] Date of Birth: 03/10/1946   Medicare Important Message Given:  Yes-second notification given    Orson Aloe 06/22/2015, 4:13 PM

## 2015-06-22 NOTE — Progress Notes (Signed)
eLink Physician-Brief Progress Note Patient Name: Jonathan Le DOB: 03/20/1946 MRN: [id number]   Date of Service  06/22/2015  HPI/Events of Note  Notified that the patient self-extubated. He had been on PS15. Is now comfortable on [place] 02. Able interact with nurses. He uses a CPAP at home so I will order nocturnal auto-set CPAP, push pulm hygiene  eICU Interventions       Intervention Category Intermediate Interventions: Other:  Destanee Bedonie S. 06/22/2015, 3:30 PM

## 2015-06-22 NOTE — Progress Notes (Signed)
PULMONARY / CRITICAL CARE MEDICINE   Name: Jonathan Le MRN: [id number] DOB: Mar 20, 1946    ADMISSION DATE:  06/18/2015 CONSULTATION DATE: 06/18/15   CHIEF COMPLAINT:  Septic shock secondary to ischemic colon. Acute respiratiory failure. Acute renal failure.  INITIAL PRESENTATION: 69 year old male with PMH as below, which is significant for OSA and HTN. 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 He progressed and was discharged. Post discharge course complicated by R leg cellulitis after endoscopic vein harvest he was seen by Dr. Donata Clay in clinic for this 8/3 and was felt to be improving. No complaints noted at that time. Was in his USOH, but was having some constipation. 8/4 PM he took an enema and was straining in bathroom. He became diaphoretic and had loss of consciousness. Wife called EMS. In ED he was noted to be profoundly hypotensive with SOB. Found to be in severe septic shock with ischemic colon s/p total abd colectomy on 8/4  STUDIES:  Echo (8/4) estimated ejection fraction was in the range of 25% to 30%. Severe hypokinesis of the anteroseptal and anterior myocardium. Dyskinesis of the apical myocardium. .  CT chest abd pelvis (8/4) CTA CHEST: Limited CTA of the chest, poor bolus timing.  Moderate to severe cardiomegaly with small pericardial effusion.Evidence of pulmonary edema with small pleural effusions and, patchy airspace opacities most consistent with confluent edema.  CT ABDOMEN AND PELVIS: Focal colonic hepatic flexure pneumatosis without bowel perforation. Moderate to large amount of distal retained large bowel stool. Mild probable ileus, no bowel obstruction.  Bilateral nonobstructing nephrolithiasis measuring up to 4  mm.  SIGNIFICANT EVENTS: 8/4- Total abdominal colectomy with abdominal vac placement 8/6 Exploratory laparotomy, removal of prior packs, closure of open abdomen, ileostomy 8/7 high pressors   SUBJECTIVE:   Calmer , on fent gtt, received versed  Wound vac aFebrile overnight Remains on pressors -lower doses   VITAL SIGNS: Temp:  [97.7 F (36.5 C)-99.3 F (37.4 C)] 97.7 F (36.5 C) (08/08 0732) Pulse Rate:  [34-119] 71 (08/08 0830) Resp:  [10-32] 20 (08/08 0830) BP: (80-146)/(44-95) 86/52 mmHg (08/08 0830) SpO2:  [90 %-100 %] 98 % (08/08 0830) Arterial Line BP: (75-145)/(43-89) 140/75 mmHg (08/07 2000) FiO2 (%):  [40 %] 40 % (08/08 0705) Weight:  [140.9 kg (310 lb 10.1 oz)] 140.9 kg (310 lb 10.1 oz) (08/08 0545) HEMODYNAMICS: CVP:  [8 mmHg-16 mmHg] 16 mmHg VENTILATOR SETTINGS: Vent Mode:  [-] PRVC FiO2 (%):  [40 %] 40 % Set Rate:  [20 bmp] 20 bmp Vt Set:  [560 mL] 560 mL PEEP:  [5 cmH20-7 cmH20] 7 cmH20 Pressure Support:  [10 cmH20] 10 cmH20 Plateau Pressure:  [17 cmH20-26 cmH20] 26 cmH20 INTAKE / OUTPUT:  Intake/Output Summary (Last 24 hours) at 06/22/15 0841 Last data filed at 06/22/15 0800  Gross per 24 hour  Intake 6398.94 ml  Output   1640 ml  Net 4758.94 ml    PHYSICAL EXAMINATION: General:mild distress. On vent Neuro: Sedated, awake, interactive, non focal HEENT: PERRL, no JVD noted Cardiovascular: Regular Rate and rhythm. No MRG Lungs: Decreased breath sounds antr Abdomen: Soft, non-tender, surgical dressing in place 1+ edema  LABS:  CBC  Recent Labs Lab 06/20/15 0430 06/20/15 1210 06/21/15 0350  WBC 8.9 10.1  12.9*  HGB 12.1* 11.8* 12.1*  HCT 36.3* 35.6* 36.5*  PLT 187 169 160   Coag's  Recent Labs Lab 06/20/15 0430 06/21/15 0350 06/22/15 0405  APTT 24 30 31   INR 1.43 1.25 1.37   BMET  Recent Labs Lab 06/20/15 0555 06/21/15 0350 06/22/15 0405  NA 136 137 139  K 3.1* 3.5 3.8  CL 105 106 107  CO2 23 23 26   BUN 17 13 11    CREATININE 0.68 0.59* 0.50*  GLUCOSE 173* 164* 138*   Electrolytes  Recent Labs Lab 06/20/15 0430 06/20/15 0555 06/21/15 0350 06/22/15 0405  CALCIUM  --  7.7* 7.9* 7.9*  MG 1.9  --  1.6* 1.6*  PHOS 1.9*  --  1.9* 2.3*   Sepsis Markers  Recent Labs Lab 06/18/15 1050  06/19/15 0555 06/19/15 0703 06/19/15 1245 06/20/15 0430  LATICACIDVEN  --   < >  --  2.9* 2.4* 2.4*  PROCALCITON 25.47  --  32.23  --   --  15.44  < > = values in this interval not displayed. ABG  Recent Labs Lab 06/18/15 1815 06/18/15 2125 06/19/15 0651  PHART 7.210* 7.373 7.491*  PCO2ART 50.2* 37.5 31.3*  PO2ART 80.0 123.0* 132.0*   Liver Enzymes  Recent Labs Lab 06/20/15 0555 06/21/15 0350 06/22/15 0405  AST 74* 43* 22  ALT 63 56 36  ALKPHOS 109 101 80  BILITOT 0.8 1.0 0.9  ALBUMIN 1.8* 1.7* 1.5*   Cardiac Enzymes  Recent Labs Lab 06/18/15 0242 06/19/15 1245 06/20/15 0430  TROPONINI 0.04* 0.11* 0.10*   Glucose  Recent Labs Lab 06/21/15 1202 06/21/15 1552 06/21/15 1917 06/21/15 2313 06/22/15 0335 06/22/15 0728  GLUCAP 126* 123* 136* 145* 128* 110*    Imaging No results found.   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  Replete lytes as needed  GASTROINTESTINAL A:  s/p total colectomy for ischemic bowel Will return to OR for ileostomy and possible closure 8/6. P:   Ct TNA -hope to start Tfs soon Protonix for SUP  HEMATOLOGIC A: Leucocytosis -improved today. P:  Transfusion only if HB <7  INFECTIOUS A:  Septic shock from ischemic bowel. All cultures are negative to date.  P:   Abx: Ceftaz 8/4>> Vanco 8/5>> 8/6 Flagyl 8/4>> Diflucan  8/4>>  - narrow depending on culture results.   ENDOCRINE A:  Hyperglycemia without h/o DM P: Cover with SSI   NEUROLOGIC A:  P:   RASS goal: -1 fent gtt Int versed   FAMILY  - Updates: Wife [name] updated 8/5, sister 8/7 - Inter-disciplinary family meet or Palliative Care meeting due by: 8/12   Summary - SBTs with goal extubation. Expect long post op course but hopeful of liberating him from the vent soon  The patient is critically ill with multiple organ systems failure and requires high complexity decision making for assessment and support, frequent evaluation and titration of therapies, application of advanced monitoring technologies and extensive interpretation of multiple databases. Critical Care Time devoted to patient care services described in this note independent of APP time is 32 minutes.    Cyril Mourning MD. Tonny Bollman. [facility] Pager [phone] If no response call [phone]    06/22/2015, 8:41 AM

## 2015-06-22 NOTE — Progress Notes (Signed)
2 Days Post-Op  Subjective: On vent   Objective: Vital signs in last 24 hours: Temp:  [97.7 F (36.5 C)-99.3 F (37.4 C)] 97.7 F (36.5 C) (08/08 0732) Pulse Rate:  [34-119] 96 (08/08 0715) Resp:  [10-32] 22 (08/08 0715) BP: (80-146)/(44-95) 109/94 mmHg (08/08 0705) SpO2:  [90 %-100 %] 100 % (08/08 0715) Arterial Line BP: (75-145)/(43-89) 140/75 mmHg (08/07 2000) FiO2 (%):  [40 %] 40 % (08/08 0705) Weight:  [140.9 kg (310 lb 10.1 oz)] 140.9 kg (310 lb 10.1 oz) (08/08 0545) Last BM Date: 06/21/15  Intake/Output from previous day: 08/07 0701 - 08/08 0700 In: 6477.3 [I.V.:3047.3; NG/GT:120; IV Piggyback:2350; TPN:960] Out: 1765 [Urine:1345; Emesis/NG output:120; Drains:100; Stool:200] Intake/Output this shift:    Incision/Wound:vac in place ostomy pink viable   Output in place   Lab Results:   Recent Labs  06/20/15 1210 06/21/15 0350  WBC 10.1 12.9*  HGB 11.8* 12.1*  HCT 35.6* 36.5*  PLT 169 160   BMET  Recent Labs  06/21/15 0350 06/22/15 0405  NA 137 139  K 3.5 3.8  CL 106 107  CO2 23 26  GLUCOSE 164* 138*  BUN 13 11  CREATININE 0.59* 0.50*  CALCIUM 7.9* 7.9*   PT/INR  Recent Labs  06/21/15 0350 06/22/15 0405  LABPROT 15.9* 17.0*  INR 1.25 1.37   ABG No results for input(s): PHART, HCO3 in the last 72 hours.  Invalid input(s): PCO2, PO2  Studies/Results: Dg Chest Port 1 View  06/21/2015   CLINICAL DATA:  Acute respiratory failure.  EXAM: PORTABLE CHEST - 1 VIEW  COMPARISON:  06/20/2015 and 06/19/2015.  FINDINGS: 0515 hr. The endotracheal tube, nasogastric tube and left IJ central venous catheter appear unchanged. There is stable cardiomegaly status post median sternotomy and CABG. Vascular congestion, bibasilar airspace opacities and probable small bilateral pleural effusions are unchanged.  IMPRESSION: Stable examination with persistent basilar airspace opacities and probable pleural effusions. Stable support system.   Electronically Signed   By:  Carey Bullocks M.D.   On: 06/21/2015 07:09    Anti-infectives: Anti-infectives    Start     Dose/Rate Route Frequency Ordered Stop   06/20/15 2200  cefTAZidime (FORTAZ) 2 g in dextrose 5 % 50 mL IVPB     2 g 100 mL/hr over 30 Minutes Intravenous 3 times per day 06/20/15 1537     06/20/15 0200  vancomycin (VANCOCIN) IVPB 1000 mg/200 mL premix  Status:  Discontinued     1,000 mg 200 mL/hr over 60 Minutes Intravenous Every 12 hours 06/19/15 1512 06/20/15 0737   06/19/15 1200  vancomycin (VANCOCIN) 1,750 mg in sodium chloride 0.9 % 500 mL IVPB  Status:  Discontinued     1,750 mg 250 mL/hr over 120 Minutes Intravenous Every 24 hours 06/18/15 1030 06/19/15 1512   06/18/15 1200  fluconazole (DIFLUCAN) IVPB 400 mg     400 mg 100 mL/hr over 120 Minutes Intravenous Every 24 hours 06/18/15 1050     06/18/15 1130  vancomycin (VANCOCIN) 2,500 mg in sodium chloride 0.9 % 500 mL IVPB     2,500 mg 250 mL/hr over 120 Minutes Intravenous  Once 06/18/15 1030 06/18/15 1314   06/18/15 1130  cefTAZidime (FORTAZ) 2 g in dextrose 5 % 50 mL IVPB  Status:  Discontinued     2 g 100 mL/hr over 30 Minutes Intravenous Every 12 hours 06/18/15 1030 06/20/15 1537   06/18/15 1030  metroNIDAZOLE (FLAGYL) IVPB 500 mg     500 mg 100 mL/hr  over 60 Minutes Intravenous Every 8 hours 06/18/15 1000        Assessment/Plan: s/p Procedure(s): WASHOUT OF ABDOMEN, CLOSURE OF ABDOMINAL WALL WITH PLACEMENT OF WOUND VAC AND ILLEOSTOMY  (N/A) Vac change at bedside Tuesday CCM following Can start TF once off pressors  Continue TNA  CONTINUE abx   LOS: 4 days    Javon Snee A. 06/22/2015

## 2015-06-22 NOTE — Progress Notes (Signed)
PARENTERAL NUTRITION CONSULT NOTE - FOLLOW UP  Pharmacy Consult for TPN Indication: Prolonged ileus  Allergies  Allergen Reactions  . Levaquin [Levofloxacin] Nausea Only  . Penicillins Hives  . Doxycycline Rash    Patient Measurements: Height:  (175.3 cm) Weight: (!) 310 lb 10.1 oz (140.9 kg) IBW/kg (Calculated) : 70.7 Adjusted Body Weight:  Usual Weight:   Vital Signs: Temp: 97.7 F (36.5 C) (08/08 0732) Temp Source: Axillary (08/08 0732) BP: 86/52 mmHg (08/08 0830) Pulse Rate: 71 (08/08 0830) Intake/Output from previous day: 08/07 0701 - 08/08 0700 In: 6477.3 [I.V.:3047.3; NG/GT:120; IV Piggyback:2350; TPN:960] Out: 1765 [Urine:1345; Emesis/NG output:120; Drains:100; Stool:200] Intake/Output from this shift: Total I/O In: 161 [I.V.:121; TPN:40] Out: 100 [Urine:100]  Labs:  Recent Labs  06/20/15 0430 06/20/15 1210 06/21/15 0350 06/22/15 0405  WBC 8.9 10.1 12.9*  --   HGB 12.1* 11.8* 12.1*  --   HCT 36.3* 35.6* 36.5*  --   PLT 187 169 160  --   APTT 24  --  30 31  INR 1.43  --  1.25 1.37     Recent Labs  06/20/15 0430 06/20/15 0500 06/20/15 0555 06/21/15 0350 06/22/15 0405  NA  --   --  136 137 139  K  --   --  3.1* 3.5 3.8  CL  --   --  105 106 107  CO2  --   --  GLUCOSE  --   --  173* 164* 138*  BUN  --   --  CREATININE  --   --  0.68 0.59* 0.50*  CALCIUM  --   --  7.7* 7.9* 7.9*  MG 1.9  --   --  1.6* 1.6*  PHOS 1.9*  --   --  1.9* 2.3*  PROT  --   --  4.6* 4.1* 4.1*  ALBUMIN  --   --  1.8* 1.7* 1.5*  AST  --   --  74* 43* 22  ALT  --   --  63 56 36  ALKPHOS  --   --  109 101 80  BILITOT  --   --  0.8 1.0 0.9  PREALBUMIN 5.2*  --   --   --  2.8*  TRIG  --  69  --   --  53   Estimated Creatinine Clearance: 121.8 mL/min (by C-G formula based on Cr of 0.5).    Recent Labs  06/21/15 2313 06/22/15 0335 06/22/15 0728  GLUCAP 145* 128* 110*    Medications:  Scheduled:  . antiseptic oral rinse  7 mL Mouth  Rinse QID  . cefTAZidime (FORTAZ)  IV  2 g Intravenous 3 times per day  . chlorhexidine gluconate  15 mL Mouth Rinse BID  . fluconazole (DIFLUCAN) IV  400 mg Intravenous Q24H  . heparin  5,000 Units Subcutaneous 3 times per day  . insulin aspart  2-6 Units Subcutaneous 6 times per day  . metronidazole  500 mg Intravenous Q8H  . pantoprazole (PROTONIX) IV  40 mg Intravenous QHS  . potassium phosphate IVPB (mmol)  20 mmol Intravenous Once  . sodium chloride  500 mL Intravenous Once    Insulin Requirements in the past 24 hours:  12 units SSI, no insulin in TPN  Current Nutrition:  NPO  Assessment:  69 y/o morbidly obese M recently under went CABG 05/22/2015 with post-op R leg cellulitis, afib. He came to ED 8/4 after losing consciousness  on toilet. Pt was hypotensive. K=2.5.  Surgeries/Procedures: 8 8/4: Total abdominal colectomy 8/6: return to OR for ileostomy and abdominal closure  GI: Morbid obesity, LBM 06/18/15, IV PPI; TG 69; Prealbumin 5.2.  Plan for TF once off pressors. Endo: no h/o DM. CBGs have been 123-145/24h, controlled on sens SSI Renal:  AKI- resolved.  Cr 0.5. UOP 0.4, I/O (+)4.8L. Phos 2.3; Mg 1.6, K 3.8. Will need electrolytes to stabilize before advancing to goal TPN rate.  Pulm: OSA, VDRF on fentanyl inf.  Hopeful to extubate soon Cards: HTN, 8/4 EF 35-40%, Septic shock 2nd ischemic colon. Levophed- plan to taper & Vasopressin- plan to d/c with wean of NE Hepatobil: INR 1.37. LFTs & TBili wnl.  TG 53 Neuro: Anxiety. Fent drip, Midaz prn ID:WBC 12.9, PCT 15.4. Septic shock secondary to ischemic colon. Afebrile. Fortaz/Flagyl/Fluconazole Best Practices: MC, SQ heparin (CBC WNL), IV PPI TPN Access: 8/4 Triple lumen CVC TPN start date: 06/19/2015, IV PPI   Nutritional Goals:  -1430-1820 kCal, 145-155 grams of protein per day -Goal Clinimix (E) 5/15 at 150ml/hr would provide 120g protein and 1704 kcal/day (this does not include IV fat emulsion)  Plan:  -  Continue Clinimix E 5/15 at 40 ml/hr. Evaluate 8/8 for rate advancement if K and Phos are replaced and stable. - No IVFE for the first 7 days of TPN while in the ICU per ASPEN/SCCM guidelines (D#4).  - KPhos IV x 1  - Give KCL x 2 runs for a total of with KPhos (KPhos provides 29 mEq K).  - Give Magnesium 2gm IV x 1  - Maintenance IV fluids were decreased by Dr. Vassie Loll on 8/6 - will not make further adjustments to rate today since TPN rate is not changing.  Currently D5NS + 20K at 65ml/hr - Daily multivitamin and trace elements - BMet, Mg, Phos in AM - TPN labs qMon/Thurs  Marisue Humble, PharmD Clinical Pharmacist [place] [facility]

## 2015-06-22 NOTE — Plan of Care (Signed)
Problem: Phase I Progression Outcomes Goal: Hemodynamically stable Outcome: Progressing Patient remains on vasopressors; able to wean some Goal: Baseline oxygen/pH stable Outcome: Progressing Patient weaned successfully all day

## 2015-06-22 NOTE — Progress Notes (Signed)
ANTIBIOTIC CONSULT NOTE - F/U  Pharmacy Consult for fortaz, fluconazole Indication: r/o sepsis  Allergies  Allergen Reactions  . Levaquin [Levofloxacin] Nausea Only  . Penicillins Hives  . Doxycycline Rash    Patient Measurements: Height:  (175.3 cm) Weight: (!) 310 lb 10.1 oz (140.9 kg) IBW/kg (Calculated) : 70.7  Vital Signs: Temp: 97.7 F (36.5 C) (08/08 0732) Temp Source: Axillary (08/08 0732) BP: 120/71 mmHg (08/08 1000) Pulse Rate: 77 (08/08 1000) Intake/Output from previous day: 08/07 0701 - 08/08 0700 In: 6477.3 [I.V.:3047.3; NG/GT:120; IV Piggyback:2350; TPN:960] Out: 1765 [Urine:1345; Emesis/NG output:120; Drains:100; Stool:200] Intake/Output from this shift: Total I/O In: 992.5 [I.V.:215.8; IV Piggyback:656.7; TPN:120] Out: 225 [Urine:225]  Labs:  Recent Labs  06/20/15 0430 06/20/15 0555 06/20/15 1210 06/21/15 0350 06/22/15 0405  WBC 8.9  --  10.1 12.9*  --   HGB 12.1*  --  11.8* 12.1*  --   PLT 187  --  169 160  --   CREATININE  --  0.68  --  0.59* 0.50*   Estimated Creatinine Clearance: 121.8 mL/min (by C-G formula based on Cr of 0.5). No results for input(s): VANCOTROUGH, VANCOPEAK, VANCORANDOM, GENTTROUGH, GENTPEAK, GENTRANDOM, TOBRATROUGH, TOBRAPEAK, TOBRARND, AMIKACINPEAK, AMIKACINTROU, AMIKACIN in the last 72 hours.   Microbiology: Recent Results (from the past 720 hour(s))  Culture, blood (routine x 2)     Status: None (Preliminary result)   Collection Time: 06/18/15  6:53 AM  Result Value Ref Range Status   Specimen Description BLOOD RIGHT HAND  Final   Special Requests BOTTLES DRAWN AEROBIC AND ANAEROBIC 5CC  Final   Culture NO GROWTH 3 DAYS  Final   Report Status PENDING  Incomplete  Culture, blood (routine x 2)     Status: None (Preliminary result)   Collection Time: 06/18/15  6:59 AM  Result Value Ref Range Status   Specimen Description BLOOD LEFT HAND  Final   Special Requests BOTTLES DRAWN AEROBIC ONLY 5CC  Final   Culture  NO GROWTH 3 DAYS  Final   Report Status PENDING  Incomplete  C difficile quick scan w PCR reflex     Status: None   Collection Time: 06/18/15 11:12 AM  Result Value Ref Range Status   C Diff antigen NEGATIVE NEGATIVE Final   C Diff toxin NEGATIVE NEGATIVE Final   C Diff interpretation Negative for toxigenic C. difficile  Final    Medical History: Past Medical History  Diagnosis Date  . Anxiety   . Hypertension   . Arthritis   . Sleep apnea     wears CPAP nightly  . Glaucoma     Medications:  Prescriptions prior to admission  Medication Sig Dispense Refill Last Dose  . acetaminophen (TYLENOL) 500 MG tablet Take 1,500 mg by mouth every 6 (six) hours as needed for headache.   06/17/2015 at Unknown time  . ALPRAZolam (XANAX) 1 MG tablet Take 1 tablet (1 mg total) by mouth 2 (two) times daily. 10 tablet 0 06/17/2015 at Unknown time  . amiodarone (PACERONE) 200 MG tablet Take 1 tablet (200 mg total) by mouth daily. 30 tablet 1 06/17/2015 at Unknown time  . aspirin EC 325 MG EC tablet Take 1 tablet (325 mg total) by mouth daily. 30 tablet 0 06/17/2015 at Unknown time  . atorvastatin (LIPITOR) 80 MG tablet Take 1 tablet (80 mg total) by mouth daily at 6 PM. 30 tablet 3 06/17/2015 at Unknown time  . clindamycin (CLEOCIN) 300 MG capsule Take 1 capsule (300 mg  total) by mouth 3 (three) times daily. 30 capsule 0 06/17/2015 at Unknown time  . finasteride (PROSCAR) 5 MG tablet Take 5 mg by mouth daily.   06/17/2015 at Unknown time  . furosemide (LASIX) 40 MG tablet TAKE 1 TABLET BY MOUTH DAILY 90 tablet 0 06/17/2015 at Unknown time  . lactulose, encephalopathy, (CHRONULAC) 10 GM/15ML SOLN Take 20 g by mouth daily as needed (for constipation).   06/17/2015 at Unknown time  . lisinopril (PRINIVIL,ZESTRIL) 5 MG tablet Take 1 tablet (5 mg total) by mouth daily. 30 tablet 3 06/17/2015 at Unknown time  . metolazone (ZAROXOLYN) 5 MG tablet TAKE 1 TABLET BY MOUTH EVERY DAY 90 tablet 0 06/17/2015 at Unknown time  . potassium  chloride SA (K-DUR,KLOR-CON) 20 MEQ tablet TAKE 1 TABLET BY MOUTH DAILY 90 tablet 0 06/17/2015 at Unknown time  . sodium phosphate (FLEET) enema Place 1 enema rectally once. follow package directions   06/17/2015 at Unknown time   Scheduled:  . antiseptic oral rinse  7 mL Mouth Rinse QID  . cefTAZidime (FORTAZ)  IV  2 g Intravenous 3 times per day  . chlorhexidine gluconate  15 mL Mouth Rinse BID  . fluconazole (DIFLUCAN) IV  400 mg Intravenous Q24H  . heparin  5,000 Units Subcutaneous 3 times per day  . insulin aspart  2-6 Units Subcutaneous 6 times per day  . magnesium sulfate 1 - 4 g bolus IVPB  2 g Intravenous Once  . metronidazole  500 mg Intravenous Q8H  . pantoprazole (PROTONIX) IV  40 mg Intravenous QHS  . potassium chloride  10 mEq Intravenous Q1 Hr x 2  . potassium phosphate IVPB (mmol)  20 mmol Intravenous Once  . sodium chloride  500 mL Intravenous Once    Assessment: 69 yo male with recent CABG(7/8) and cellulitis readmitted 8/4 with septic shock (per MD- likely from an intra-abdominal source due to either ischemic or infectious colitis).     Infectious Disease: septic shock from ischemic bowel. On empiric fluconazole/fortaz/flagyl. WBC= 12.9, afebrile, Noted on clindamycin PTA Wound vac placed 8/7  8/4 fortaz>> 8/4 vanco>> 8/6 8/4 flagyl>> 8/4 fluconazole >>  8/4 blood x2 8/5 c-diff neg  Plan:  -Continue Fortaz 2gm IV q8h  -Will follow renal function, cultures and clinical progress -fluconazole 400mg  iv q24h -metronidazole 500 mg iv q 8  Isaac Bliss, PharmD, [facility] Clinical Pharmacist Pager [phone] 06/22/2015 10:46 AM

## 2015-06-22 NOTE — Progress Notes (Signed)
Pt self extubated. 4L [place] was placed on pt with O2 sat 94%. Pt does not appear to be in any distress. Nurse notified MD and family. Nurse will continue to monitor

## 2015-06-22 NOTE — Procedures (Signed)
Extubation Procedure Note  Patient Details:   Name: Jonathan Le DOB: [birth year] MRN: [id number]   Airway Documentation:     Evaluation  O2 sats: stable throughout Complications: No apparent complications Patient did tolerate procedure well. Bilateral Breath Sounds: Rhonchi Suctioning: Oral, Airway Yes   RT called to patient's room following self extubation. Patient able to speak clearly, bilateral breath sounds, placed on 4 Lpm nasal cannula with H2O.  Forest Becker Raisha Brabender 06/22/2015, 3:30 PM

## 2015-06-22 NOTE — Progress Notes (Signed)
RT called to patient's room due to self extubation.  Patient able to speak clearly, bilateral BS, placed on 4 Lpm [place] with O2. RT will continue to monitor.

## 2015-06-23 ENCOUNTER — Inpatient Hospital Stay (HOSPITAL_COMMUNITY): Payer: Medicare Other

## 2015-06-23 LAB — GLUCOSE, CAPILLARY
GLUCOSE-CAPILLARY: 89 mg/dL (ref 65–99)
Glucose-Capillary: 108 mg/dL — ABNORMAL HIGH (ref 65–99)
Glucose-Capillary: 125 mg/dL — ABNORMAL HIGH (ref 65–99)
Glucose-Capillary: 126 mg/dL — ABNORMAL HIGH (ref 65–99)
Glucose-Capillary: 130 mg/dL — ABNORMAL HIGH (ref 65–99)
Glucose-Capillary: 83 mg/dL (ref 65–99)

## 2015-06-23 LAB — POCT I-STAT 3, ART BLOOD GAS (G3+)
Acid-base deficit: 2 mmol/L (ref 0.0–2.0)
Bicarbonate: 22.5 mEq/L (ref 20.0–24.0)
O2 SAT: 91 %
PO2 ART: 57 mmHg — AB (ref 80.0–100.0)
Patient temperature: 98
TCO2: 24 mmol/L (ref 0–100)
pCO2 arterial: 34.7 mmHg — ABNORMAL LOW (ref 35.0–45.0)
pH, Arterial: 7.418 (ref 7.350–7.450)

## 2015-06-23 LAB — BASIC METABOLIC PANEL WITH GFR
Anion gap: 7 (ref 5–15)
BUN: 12 mg/dL (ref 6–20)
CO2: 24 mmol/L (ref 22–32)
Calcium: 8.1 mg/dL — ABNORMAL LOW (ref 8.9–10.3)
Chloride: 105 mmol/L (ref 101–111)
Creatinine, Ser: 0.53 mg/dL — ABNORMAL LOW (ref 0.61–1.24)
GFR calc Af Amer: >60 mL/min
GFR calc non Af Amer: >60 mL/min
Glucose, Bld: 133 mg/dL — ABNORMAL HIGH (ref 65–99)
Potassium: 4.2 mmol/L (ref 3.5–5.1)
Sodium: 136 mmol/L (ref 135–145)

## 2015-06-23 LAB — CBC
HCT: 40.3 % (ref 39.0–52.0)
Hemoglobin: 13 g/dL (ref 13.0–17.0)
MCH: 28.2 pg (ref 26.0–34.0)
MCHC: 32.3 g/dL (ref 30.0–36.0)
MCV: 87.4 fL (ref 78.0–100.0)
PLATELETS: 181 10*3/uL (ref 150–400)
RBC: 4.61 MIL/uL (ref 4.22–5.81)
RDW: 15 % (ref 11.5–15.5)
WBC: 10.7 10*3/uL — ABNORMAL HIGH (ref 4.0–10.5)

## 2015-06-23 LAB — CULTURE, BLOOD (ROUTINE X 2)
Culture: NO GROWTH
Culture: NO GROWTH

## 2015-06-23 LAB — APTT: aPTT: 31 s (ref 24–37)

## 2015-06-23 LAB — PHOSPHORUS: Phosphorus: 2.6 mg/dL (ref 2.5–4.6)

## 2015-06-23 LAB — PROTIME-INR
INR: 1.55 — ABNORMAL HIGH (ref 0.00–1.49)
Prothrombin Time: 18.7 seconds — ABNORMAL HIGH (ref 11.6–15.2)

## 2015-06-23 LAB — MAGNESIUM: MAGNESIUM: 1.6 mg/dL — AB (ref 1.7–2.4)

## 2015-06-23 MED ORDER — MAGNESIUM SULFATE 50 % IJ SOLN
3.0000 g | Freq: Once | INTRAVENOUS | Status: DC
  Filled 2015-06-23: qty 6

## 2015-06-23 MED ORDER — TRACE MINERALS CR-CU-MN-SE-ZN 10-1000-500-60 MCG/ML IV SOLN
INTRAVENOUS | Status: AC
  Administered 2015-06-23: 17:00:00 via INTRAVENOUS
  Filled 2015-06-23: qty 1440

## 2015-06-23 MED ORDER — MAGNESIUM SULFATE 2 GM/50ML IV SOLN
2.0000 g | Freq: Once | INTRAVENOUS | Status: AC
  Administered 2015-06-23: 2 g via INTRAVENOUS
  Filled 2015-06-23: qty 50

## 2015-06-23 MED ORDER — FUROSEMIDE 10 MG/ML IJ SOLN
40.0000 mg | Freq: Once | INTRAMUSCULAR | Status: AC
  Administered 2015-06-23: 40 mg via INTRAVENOUS

## 2015-06-23 MED ORDER — FENTANYL CITRATE (PF) 100 MCG/2ML IJ SOLN
25.0000 ug | INTRAMUSCULAR | Status: DC | PRN
  Administered 2015-06-23 (×3): 50 ug via INTRAVENOUS
  Administered 2015-06-24 – 2015-06-25 (×5): 25 ug via INTRAVENOUS
  Administered 2015-06-29: 50 ug via INTRAVENOUS
  Filled 2015-06-23 (×10): qty 2

## 2015-06-23 NOTE — Progress Notes (Signed)
PULMONARY / CRITICAL CARE MEDICINE   Name: Jonathan Le MRN: [id number] DOB: [birth year]    ADMISSION DATE:  06/18/2015 CONSULTATION DATE: 06/18/15   CHIEF COMPLAINT:  Septic shock secondary to ischemic colon. Acute respiratiory failure. Acute renal failure.  INITIAL PRESENTATION: 69 year old male with PMH as below, which is significant for OSA and HTN. 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 He progressed and was discharged. Post discharge course complicated by R leg cellulitis after endoscopic vein harvest he was seen by Dr. Donata Clay in clinic for this 8/3 and was felt to be improving. No complaints noted at that time. Was in his USOH, but was having some constipation. 8/4 PM he took an enema and was straining in bathroom. He became diaphoretic and had loss of consciousness. Wife called EMS. In ED he was noted to be profoundly hypotensive with SOB. Found to be in severe septic shock with ischemic colon s/p total abd colectomy on 8/4  STUDIES:  Echo (8/4) estimated ejection fraction was in the range of 25% to 30%. Severe hypokinesis of the anteroseptal and anterior myocardium. Dyskinesis of the apical myocardium. .  CT chest abd pelvis (8/4) CTA CHEST: Limited CTA of the chest, poor bolus timing.  Moderate to severe cardiomegaly with small pericardial effusion.Evidence of pulmonary edema with small pleural effusions and, patchy airspace opacities most consistent with confluent edema.  CT ABDOMEN AND PELVIS: Focal colonic hepatic flexure pneumatosis without bowel perforation. Moderate to large amount of distal retained large bowel stool. Mild probable ileus, no bowel obstruction.  Bilateral nonobstructing nephrolithiasis measuring up to 4  mm.  SIGNIFICANT EVENTS: 8/4- Total abdominal colectomy with abdominal vac placement 8/6 Exploratory laparotomy, removal of prior packs, closure of open abdomen, ileostomy 8/7 high pressors 8/8 self extubated   SUBJECTIVE:   Self extubated, no resp distress Wound vac aFebrile overnight Remains on pressors -lower doses   VITAL SIGNS: Temp:  [97.4 F (36.3 C)-98.8 F (37.1 C)] 98.1 F (36.7 C) (08/09 0800) Pulse Rate:  [35-113] 93 (08/09 0700) Resp:  [11-37] 26 (08/09 0700) BP: (84-134)/(45-96) 90/53 mmHg (08/09 0700) SpO2:  [92 %-100 %] 94 % (08/09 0700) FiO2 (%):  [40 %] 40 % (08/08 1400) Weight:  [309 lb 4.9 oz (140.3 kg)] 309 lb 4.9 oz (140.3 kg) (08/09 0500) HEMODYNAMICS: CVP:  [9 mmHg-13 mmHg] 10 mmHg VENTILATOR SETTINGS: Vent Mode:  [-] CPAP;PSV FiO2 (%):  [40 %] 40 % PEEP:  [5 cmH20] 5 cmH20 Pressure Support:  [15 cmH20] 15 cmH20 INTAKE / OUTPUT:  Intake/Output Summary (Last 24 hours) at 06/23/15 0816 Last data filed at 06/23/15 0700  Gross per 24 hour  Intake 2855.8 ml  Output   1575 ml  Net 1280.8 ml    PHYSICAL EXAMINATION: General:no resp  distress. Neuro:awake, interactive, non focal HEENT: PERRL, no JVD noted Cardiovascular: Regular Rate and rhythm. No MRG Lungs: Decreased breath sounds antr Abdomen: Soft, non-tender, surgical dressing in place 1+ edema  LABS:  CBC  Recent Labs Lab 06/20/15 1210 06/21/15 0350 06/23/15 0555  WBC 10.1 12.9* 10.7*  HGB 11.8* 12.1* 13.0  HCT 35.6* 36.5* 40.3  PLT 169 160 181   Coag's  Recent Labs Lab 06/21/15 0350 06/22/15 0405 06/23/15 0555  APTT 30 31  31  INR 1.25 1.37 1.55*   BMET  Recent Labs Lab 06/21/15 0350 06/22/15 0405 06/23/15 0555  NA 137 139 136  K 3.5 3.8 4.2  CL 106 107 105  CO2 23 26 24   BUN 13 11 12   CREATININE 0.59* 0.50* 0.53*  GLUCOSE 164* 138* 133*   Electrolytes  Recent Labs Lab 06/21/15 0350 06/22/15 0405 06/23/15 0555  CALCIUM 7.9* 7.9* 8.1*  MG 1.6*  1.6* 1.6*  PHOS 1.9* 2.3* 2.6   Sepsis Markers  Recent Labs Lab 06/18/15 1050  06/19/15 0555 06/19/15 0703 06/19/15 1245 06/20/15 0430  LATICACIDVEN  --   < >  --  2.9* 2.4* 2.4*  PROCALCITON 25.47  --  32.23  --   --  15.44  < > = values in this interval not displayed. ABG  Recent Labs Lab 06/18/15 2125 06/19/15 0651 06/22/15 1632  PHART 7.373 7.491* 7.349*  PCO2ART 37.5 31.3* 41.7  PO2ART 123.0* 132.0* 70.0*   Liver Enzymes  Recent Labs Lab 06/20/15 0555 06/21/15 0350 06/22/15 0405  AST 74* 43* 22  ALT 63 56 36  ALKPHOS 109 101 80  BILITOT 0.8 1.0 0.9  ALBUMIN 1.8* 1.7* 1.5*   Cardiac Enzymes  Recent Labs Lab 06/18/15 0242 06/19/15 1245 06/20/15 0430  TROPONINI 0.04* 0.11* 0.10*   Glucose  Recent Labs Lab 06/22/15 1157 06/22/15 1624 06/22/15 1910 06/22/15 2359 06/23/15 0353 06/23/15 0759  GLUCAP 128* 122* 139* 83 108* 126*    Imaging Dg Chest Port 1 View  06/23/2015   CLINICAL DATA:  Follow-up of respiratory failure  EXAM: PORTABLE CHEST - 1 VIEW  COMPARISON:  Portable chest x-ray of June 22, 2015  FINDINGS: The lungs are reasonably well inflated. The interstitial markings are less prominent today. There is subsegmental atelectasis in the left perihilar region which is more stable. There is better visualization of the lateral aspect of the left hemidiaphragm. The cardiac silhouette remains quite enlarged. The central pulmonary vascularity though mildly prominent is more distinct today. The left internal jugular venous catheter tip projects at the junction of the right and left brachiocephalic veins. The nasogastric tube tip projects below the inferior margin of the image. There is 7 intact sternal wires from previous CABG.  IMPRESSION: Improving interstitial edema. New left perihilar subsegmental atelectasis. Stable enlargement of the cardiac silhouette.   Electronically Signed   By: David  [place] M.D.   On: 06/23/2015 07:37   Dg Chest Port 1  View  06/22/2015   CLINICAL DATA:  Acute respiratory failure.  EXAM: PORTABLE CHEST - 1 VIEW  COMPARISON:  One-view chest x-ray 06/21/2015  FINDINGS: The heart is enlarged. The endotracheal tube terminates 3 cm above the carina, in satisfactory position. An NG tube courses off the inferior border of the film. A left IJ line is stable, terminating within the innominate vein. The mild edema and a probable small left effusion is unchanged. Bibasilar airspace disease likely reflects atelectasis, worse on the left.  IMPRESSION: 1. Stable cardiomegaly and mild edema compatible with congestive heart failure. 2. Mild bibasilar airspace disease likely reflects atelectasis and effusions. 3. Support apparatus is stable.   Electronically Signed   By: Marin Roberts M.D.   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  Replete lytes as needed  GASTROINTESTINAL A:  s/p total colectomy for ischemic bowel Will return to OR for ileostomy and possible closure 8/6. P:   Ct TNA -hope to start Tfs soon Protonix for SUP  HEMATOLOGIC A: Leucocytosis -improved today. P:  Transfusion only if HB <7  INFECTIOUS A:  Septic shock from ischemic bowel. All cultures are negative to date.  P:   Abx: Ceftaz 8/4>> Vanco 8/5>> 8/6 Flagyl 8/4>> Diflucan 8/4>>  - narrow depending on culture results.   ENDOCRINE A:  Hyperglycemia without h/o DM P: Cover with SSI   NEUROLOGIC A: Pain deconditioning P:   fent int PT consult   FAMILY  - Updates: Wife [name] updated 8/5, sister 8/7 - Inter-disciplinary family meet or Palliative Care meeting due by: 8/12   Summary -  Doing well from resp standpoint,  hope to advance PO as he comes off pressors & regains bowel function  The patient is critically ill with multiple organ systems failure and requires high complexity decision making for assessment and support, frequent evaluation and titration of therapies, application of advanced monitoring technologies and extensive interpretation of multiple databases. Critical Care Time devoted to patient care services described in this note independent of APP time is 31 minutes.    Cyril Mourning MD. Tonny Bollman. [place] [facility] Pager [phone] If no response call [phone]    06/23/2015, 8:16 AM

## 2015-06-23 NOTE — Progress Notes (Addendum)
eLink Physician-Brief Progress Note Patient Name: Jonathan Le DOB: 07-14-1946 MRN: [id number]   Date of Service  06/23/2015  HPI/Events of Note  Respiratory distress - ABG earlier = 7.41/34.7/57.0. Large body habitus. CXR with decreased interstitial edema and atelectasis. Not even able to do 200 mL on IS.   eICU Interventions  Will order: 1. BiPAP to help with recruitment.  2. Lasix 40 mg IV X 1 now.      Intervention Category Intermediate Interventions: Respiratory distress - evaluation and management  Sommer,Steven Eugene 06/23/2015, 8:30 PM

## 2015-06-23 NOTE — Progress Notes (Signed)
eLink Physician-Brief Progress Note Patient Name: Jonathan Le DOB: 10-Jan-1946 MRN: [id number]   Date of Service  06/23/2015  HPI/Events of Note  Patient reported to by more confused and lethargic. Patient has large body habitus, therefore, may have obesity hypoventilation and/or OSA. Add to the intrigue that the patient just received Fentanyl for pain.   eICU Interventions  Will check ABG now.      Intervention Category Major Interventions: Delirium, psychosis, severe agitation - evaluation and management  Sommer,Steven Eugene 06/23/2015, 3:50 PM

## 2015-06-23 NOTE — Progress Notes (Signed)
50ml of fentanyl wasted down sink. Witnessed by Fatima Sanger, RN.

## 2015-06-23 NOTE — Progress Notes (Signed)
Patient ID: Jonathan Le, male   DOB: 1946/03/26, 69 y.o.   MRN: [id number] 3 Days Post-Op  Subjective: Pt feels ok.  Having some pain this am, but his post op pain.  C/o feeling confused and having bad dreams.  Objective: Vital signs in last 24 hours: Temp:  [97.4 F (36.3 C)-98.8 F (37.1 C)] 98.1 F (36.7 C) (08/09 0800) Pulse Rate:  [35-113] 91 (08/09 0800) Resp:  [11-37] 20 (08/09 0800) BP: (84-134)/(45-96) 92/56 mmHg (08/09 0800) SpO2:  [92 %-100 %] 93 % (08/09 0800) FiO2 (%):  [40 %] 40 % (08/08 1400) Weight:  [140.3 kg (309 lb 4.9 oz)] 140.3 kg (309 lb 4.9 oz) (08/09 0500) Last BM Date: 06/23/15  Intake/Output from previous day: 08/08 0701 - 08/09 0700 In: 3016.8 [I.V.:710.1; NG/GT:90; IV Piggyback:1256.7; TPN:960] Out: 1675 [Urine:1325; Emesis/NG output:100; Drains:150; Stool:100] Intake/Output this shift: Total I/O In: 135.1 [I.V.:15.1; NG/GT:30; IV Piggyback:50; TPN:40] Out: 105 [Urine:105]  PE: Abd: soft, appropriately tender, few BS, ileostomy with some bilious output, midline wound with VAC in place, cannister output looks a little cloudy, but otherwise unremarkable  Lab Results:   Recent Labs  06/21/15 0350 06/23/15 0555  WBC 12.9* 10.7*  HGB 12.1* 13.0  HCT 36.5* 40.3  PLT 160 181   BMET  Recent Labs  06/22/15 0405 06/23/15 0555  NA 139 136  K 3.8 4.2  CL 107 105  CO2 26 24  GLUCOSE 138* 133*  BUN 11 12  CREATININE 0.50* 0.53*  CALCIUM 7.9* 8.1*   PT/INR  Recent Labs  06/22/15 0405 06/23/15 0555  LABPROT 17.0* 18.7*  INR 1.37 1.55*   CMP     Component Value Date/Time   NA 136 06/23/2015 0555   K 4.2 06/23/2015 0555   CL 105 06/23/2015 0555   CO2 24 06/23/2015 0555   GLUCOSE 133* 06/23/2015 0555   BUN 12 06/23/2015 0555   CREATININE 0.53* 06/23/2015 0555   CALCIUM 8.1* 06/23/2015 0555   PROT 4.1* 06/22/2015 0405   ALBUMIN 1.5* 06/22/2015 0405   AST 22 06/22/2015 0405   ALT 36 06/22/2015 0405   ALKPHOS 80 06/22/2015 0405    BILITOT 0.9 06/22/2015 0405   GFRNONAA >60 06/23/2015 0555   GFRAA >60 06/23/2015 0555   Lipase     Component Value Date/Time   LIPASE 15* 05/19/2015 2140       Studies/Results: Dg Chest Port 1 View  06/23/2015   CLINICAL DATA:  Follow-up of respiratory failure  EXAM: PORTABLE CHEST - 1 VIEW  COMPARISON:  Portable chest x-ray of June 22, 2015  FINDINGS: The lungs are reasonably well inflated. The interstitial markings are less prominent today. There is subsegmental atelectasis in the left perihilar region which is more stable. There is better visualization of the lateral aspect of the left hemidiaphragm. The cardiac silhouette remains quite enlarged. The central pulmonary vascularity though mildly prominent is more distinct today. The left internal jugular venous catheter tip projects at the junction of the right and left brachiocephalic veins. The nasogastric tube tip projects below the inferior margin of the image. There is 7 intact sternal wires from previous CABG.  IMPRESSION: Improving interstitial edema. New left perihilar subsegmental atelectasis. Stable enlargement of the cardiac silhouette.   Electronically Signed   By: David  [place] M.D.   On: 06/23/2015 07:37   Dg Chest Port 1 View  06/22/2015   CLINICAL DATA:  Acute respiratory failure.  EXAM: PORTABLE CHEST - 1 VIEW  COMPARISON:  One-view chest  x-ray 06/21/2015  FINDINGS: The heart is enlarged. The endotracheal tube terminates 3 cm above the carina, in satisfactory position. An NG tube courses off the inferior border of the film. A left IJ line is stable, terminating within the innominate vein. The mild edema and a probable small left effusion is unchanged. Bibasilar airspace disease likely reflects atelectasis, worse on the left.  IMPRESSION: 1. Stable cardiomegaly and mild edema compatible with congestive heart failure. 2. Mild bibasilar airspace disease likely reflects atelectasis and effusions. 3. Support apparatus is stable.    Electronically Signed   By: Marin Roberts M.D.   On: 06/22/2015 09:29    Anti-infectives: Anti-infectives    Start     Dose/Rate Route Frequency Ordered Stop   06/20/15 2200  cefTAZidime (FORTAZ) 2 g in dextrose 5 % 50 mL IVPB     2 g 100 mL/hr over 30 Minutes Intravenous 3 times per day 06/20/15 1537     06/20/15 0200  vancomycin (VANCOCIN) IVPB 1000 mg/200 mL premix  Status:  Discontinued     1,000 mg 200 mL/hr over 60 Minutes Intravenous Every 12 hours 06/19/15 1512 06/20/15 0737   06/19/15 1200  vancomycin (VANCOCIN) 1,750 mg in sodium chloride 0.9 % 500 mL IVPB  Status:  Discontinued     1,750 mg 250 mL/hr over 120 Minutes Intravenous Every 24 hours 06/18/15 1030 06/19/15 1512   06/18/15 1200  fluconazole (DIFLUCAN) IVPB 400 mg     400 mg 100 mL/hr over 120 Minutes Intravenous Every 24 hours 06/18/15 1050     06/18/15 1130  vancomycin (VANCOCIN) 2,500 mg in sodium chloride 0.9 % 500 mL IVPB     2,500 mg 250 mL/hr over 120 Minutes Intravenous  Once 06/18/15 1030 06/18/15 1314   06/18/15 1130  cefTAZidime (FORTAZ) 2 g in dextrose 5 % 50 mL IVPB  Status:  Discontinued     2 g 100 mL/hr over 30 Minutes Intravenous Every 12 hours 06/18/15 1030 06/20/15 1537   06/18/15 1030  metroNIDAZOLE (FLAGYL) IVPB 500 mg     500 mg 100 mL/hr over 60 Minutes Intravenous Every 8 hours 06/18/15 1000         Assessment/Plan   POD 5,3, s/p ex lap with subtotal colectomy, open abdomen, take back with creation of ileostomy and closure -NGT with no output, will DC today and give sips of clears -start VAC changes to midline wound, TRS -Dc flexi-seal -mobilize as able.  D/W CCM, BP still low, but they have written for PT to see patient and get him up, which I agree with. -pulm toilet -cont TNA for now, hopefully can wean this as his diet is advanced DVT prophylaxis -SCDs/Heparin  LOS: 5 days    Kaitlynd Phillips E 06/23/2015, 9:40 AM Pager: [phone]

## 2015-06-23 NOTE — Progress Notes (Signed)
PARENTERAL NUTRITION CONSULT NOTE - FOLLOW UP  Pharmacy Consult for TPN Indication: Prolonged ileus  Allergies  Allergen Reactions  . Levaquin [Levofloxacin] Nausea Only  . Penicillins Hives  . Doxycycline Rash    Patient Measurements: Height: 5\' 9"  (175.3 cm) Weight: (!) 309 lb 4.9 oz (140.3 kg) IBW/kg (Calculated) : 70.7 Adjusted Body Weight:  Usual Weight:   Vital Signs: Temp: 98.1 F (36.7 C) (08/09 0800) Temp Source: Oral (08/09 0800) BP: 92/56 mmHg (08/09 0800) Pulse Rate: 91 (08/09 0800) Intake/Output from previous day: 08/08 0701 - 08/09 0700 In: 3016.8 [I.V.:710.1; NG/GT:90; IV Piggyback:1256.7; TPN:960] Out: 1675 [Urine:1325; Emesis/NG output:100; Drains:150; Stool:100] Intake/Output from this shift: Total I/O In: 135.1 [I.V.:15.1; NG/GT:30; IV Piggyback:50; TPN:40] Out: 20 [Urine:20]  Labs:  Recent Labs  06/20/15 1210 06/21/15 0350 06/22/15 0405 06/23/15 0555  WBC 10.1 12.9*  --  10.7*  HGB 11.8* 12.1*  --  13.0  HCT 35.6* 36.5*  --  40.3  PLT 169 160  --  181  APTT  --  30 31 31   INR  --  1.25 1.37 1.55*     Recent Labs  06/21/15 0350 06/22/15 0405 06/23/15 0555  NA 137 139 136  K 3.5 3.8 4.2  CL 106 107 105  CO2 23 26 24   GLUCOSE 164* 138* 133*  BUN 13 11 12   CREATININE 0.59* 0.50* 0.53*  CALCIUM 7.9* 7.9* 8.1*  MG 1.6* 1.6* 1.6*  PHOS 1.9* 2.3* 2.6  PROT 4.1* 4.1*  --   ALBUMIN 1.7* 1.5*  --   AST 43* 22  --   ALT 56 36  --   ALKPHOS 101 80  --   BILITOT 1.0 0.9  --   PREALBUMIN  --  2.8*  --   TRIG  --  53  --    Estimated Creatinine Clearance: 121.4 mL/min (by C-G formula based on Cr of 0.53).    Recent Labs  06/22/15 2359 06/23/15 0353 06/23/15 0759  GLUCAP 83 108* 126*    Medications:  Scheduled:  . antiseptic oral rinse  7 mL Mouth Rinse QID  . cefTAZidime (FORTAZ)  IV  2 g Intravenous 3 times per day  . chlorhexidine gluconate  15 mL Mouth Rinse BID  . fluconazole (DIFLUCAN) IV  400 mg Intravenous Q24H  .  heparin  5,000 Units Subcutaneous 3 times per day  . insulin aspart  2-6 Units Subcutaneous 6 times per day  . metronidazole  500 mg Intravenous Q8H  . pantoprazole (PROTONIX) IV  40 mg Intravenous QHS  . sodium chloride  500 mL Intravenous Once    Insulin Requirements in the past 24 hours:  4 units ICU-SSI, no insulin in TPN  Current Nutrition:  NPO  Assessment:  69 y/o morbidly obese M recently under went CABG 05/22/2015 with post-op R leg cellulitis, afib. He came to ED 8/4 after losing consciousness on toilet. Pt was hypotensive. K=2.5.  Surgeries/Procedures: 8 8/4: Total abdominal colectomy 8/6: return to OR for ileostomy and abdominal closure  GI: Morbid obesity, LBM 06/18/15, IV PPI; TG 69; Prealbumin 2.8- decreased. Abd soft, non-tender.  NG output ,  Plan for TF once off pressors. Endo: no h/o DM. CBGs have been 83-139/24h, controlled & requiring minimal SSI Renal: AKI- resolved. Cr 0.53. UOP 0.4, I/O (+)1.5L. Phos 2.6; Mg 1.6- unchanging despite 6g/3 days, K 4.2- received (K-runs + K-Phos) on 8/8.  NS at [facility] Pulm: OSA, Self extubated 8/8, now [place]-4L with CPAP overnight Cards: HTN,  8/4 EF 35-40%, ICM.  Septic shock 2nd ischemic colon. Levophed- weaning & Vasopressin- rate decr this AM, plan to d/c when off NE. Hepatobil: INR 1.37. LFTs & TBili wnl. TG 53 Neuro: Anxiety. Fent drip, Midaz prn ID:WBC 10.7.  PCT 15.4 (8/6). Septic shock secondary to ischemic colon. Afebrile.  8/4 blood cx are ntd.  Fortaz/Flagyl/Fluconazole  Best Practices: MC, SQ heparin (CBC WNL), IV PPI TPN Access: 8/4 Triple lumen CVC TPN start date: 06/19/2015, IV PPI   Nutritional Goals:  -1430-1820 kCal, 145-155 grams of protein per day -Goal Clinimix (E) 5/15 at 133ml/hr would provide 120g protein and 1704 kcal/day (this does not include IV fat emulsion)  Plan:  - Increase Clinimix E 5/15 to 60 ml/hr.  - No IVFE for the first 7 days of TPN while in the ICU per ASPEN/SCCM  guidelines (D#5).  - Give Magnesium 3gm IV x 1  - Daily multivitamin and trace elements - BMet, Mg, Phos in AM - TPN labs qMon/Thurs  Marisue Humble, PharmD Clinical Pharmacist [place] [facility]- [facility]

## 2015-06-23 NOTE — Progress Notes (Signed)
Placed pt. On bipap with MD permission. Pt. Having SOB, RR increased. Pt. Tolerating well at this time. RT to monitor.

## 2015-06-23 NOTE — Progress Notes (Signed)
Nutrition Follow Up  DOCUMENTATION CODES:   Morbid obesity  INTERVENTION:    TPN per pharmacy  NUTRITION DIAGNOSIS:   Inadequate oral intake now related to altered GI function as evidenced by NPO status, ongoing  GOAL:   Patient will meet greater than or equal to 90% of their needs, progressing  MONITOR:   Diet advancement, PO intake, Labs, Weight trends, I & O's  ASSESSMENT:   [name] s/p CABG 7/8 presented to ED 8/4 after losing consciousness on toilet. In ED he was hypoxic requiring NRB and hypotensive. PCCM called for admission.   Patient s/p procedure 8/4: TOTAL ABDOMINAL COLECTOMY ABDOMINAL VAC PLACEMENT  Patient extubated 8/8.  Surgery note reviewed.  NGT to D/C.  Pt to start of sips of clears and VAC changes to abd midline wound.  Patient is receiving TPN with Clinimix E 5/15 @ 60 ml/hr.  No IVFE at this time.  Provides 1502 kcal and 72 grams protein per day.  Meets 65% minimum estimated energy needs and 50% minimum estimated protein needs.  Diet Order:  .TPN (CLINIMIX-E) Adult .TPN (CLINIMIX-E) Adult Diet NPO time specified Except for: Other (See Comments), Ice Chips, Sips with Meds  Skin:  Wound (see comment) (abdominal wound VAC)  Last BM:  8/9  Height:   Ht Readings from Last 1 Encounters:  06/19/15  (1.753 m)    Weight:   Wt Readings from Last 1 Encounters:  06/23/15 309 lb 4.9 oz (140.3 kg)    Ideal Body Weight:  72.7 kg  BMI:  Body mass index is 45.66 kg/(m^2).  Estimated Nutritional Needs:   Kcal:  2300-2500  Protein:  145-155 gm  Fluid:  per MD  EDUCATION NEEDS:   No education needs identified at this time  Maureen Chatters, RD, LDN Pager #: [phone] After-Hours Pager #: [phone]

## 2015-06-23 NOTE — Progress Notes (Signed)
Patient resting comfortably on 4LNC at this time, sats are 93%. Per RN patient will start wearing CPAP tomorrow tonight.

## 2015-06-24 ENCOUNTER — Inpatient Hospital Stay (HOSPITAL_COMMUNITY): Payer: Medicare Other

## 2015-06-24 LAB — BASIC METABOLIC PANEL
Anion gap: 9 (ref 5–15)
BUN: 20 mg/dL (ref 6–20)
CO2: 25 mmol/L (ref 22–32)
Calcium: 8.3 mg/dL — ABNORMAL LOW (ref 8.9–10.3)
Chloride: 105 mmol/L (ref 101–111)
Creatinine, Ser: 0.74 mg/dL (ref 0.61–1.24)
GFR calc Af Amer: >60 mL/min (ref >60)
GLUCOSE: 165 mg/dL — AB (ref 65–99)
POTASSIUM: 4.4 mmol/L (ref 3.5–5.1)
SODIUM: 139 mmol/L (ref 135–145)

## 2015-06-24 LAB — CBC
HCT: 44.9 % (ref 39.0–52.0)
Hemoglobin: 14.8 g/dL (ref 13.0–17.0)
MCH: 29 pg (ref 26.0–34.0)
MCHC: 33 g/dL (ref 30.0–36.0)
MCV: 88 fL (ref 78.0–100.0)
PLATELETS: 226 10*3/uL (ref 150–400)
RBC: 5.1 MIL/uL (ref 4.22–5.81)
RDW: 15 % (ref 11.5–15.5)
WBC: 21.7 10*3/uL — AB (ref 4.0–10.5)

## 2015-06-24 LAB — GLUCOSE, CAPILLARY
GLUCOSE-CAPILLARY: 149 mg/dL — AB (ref 65–99)
GLUCOSE-CAPILLARY: 149 mg/dL — AB (ref 65–99)
GLUCOSE-CAPILLARY: 152 mg/dL — AB (ref 65–99)
Glucose-Capillary: 164 mg/dL — ABNORMAL HIGH (ref 65–99)
Glucose-Capillary: 160 mg/dL — ABNORMAL HIGH (ref 65–99)
Glucose-Capillary: 156 mg/dL — ABNORMAL HIGH (ref 65–99)

## 2015-06-24 LAB — APTT: APTT: 31 s (ref 24–37)

## 2015-06-24 LAB — PROTIME-INR
INR: 1.83 — ABNORMAL HIGH (ref 0.00–1.49)
PROTHROMBIN TIME: 21.1 s — AB (ref 11.6–15.2)

## 2015-06-24 LAB — PHOSPHORUS: PHOSPHORUS: 2.9 mg/dL (ref 2.5–4.6)

## 2015-06-24 LAB — MAGNESIUM: Magnesium: 1.7 mg/dL (ref 1.7–2.4)

## 2015-06-24 MED ORDER — SODIUM CHLORIDE 0.9 % IJ SOLN
10.0000 mL | Freq: Two times a day (BID) | INTRAMUSCULAR | Status: DC
  Administered 2015-06-24 – 2015-06-29 (×10): 10 mL
  Administered 2015-06-30 – 2015-07-01 (×2): 20 mL
  Administered 2015-07-01 – 2015-07-02 (×2): 10 mL

## 2015-06-24 MED ORDER — TRACE MINERALS CR-CU-MN-SE-ZN 10-1000-500-60 MCG/ML IV SOLN
INTRAVENOUS | Status: DC
  Administered 2015-06-24: 17:00:00 via INTRAVENOUS
  Filled 2015-06-24: qty 2400

## 2015-06-24 MED ORDER — SODIUM CHLORIDE 0.9 % IJ SOLN: 10.0000 mL | INTRAMUSCULAR | Status: DC | PRN

## 2015-06-24 MED ORDER — FUROSEMIDE 10 MG/ML IJ SOLN
40.0000 mg | Freq: Once | INTRAMUSCULAR | Status: AC
  Administered 2015-06-24: 40 mg via INTRAVENOUS
  Filled 2015-06-24: qty 4

## 2015-06-24 MED ORDER — LORAZEPAM 2 MG/ML IJ SOLN
0.5000 mg | INTRAMUSCULAR | Status: DC | PRN
  Administered 2015-06-27: 1 mg via INTRAVENOUS
  Filled 2015-06-24: qty 1

## 2015-06-24 NOTE — Progress Notes (Signed)
PULMONARY / CRITICAL CARE MEDICINE   Name: Jonathan Le MRN: [id number] DOB: 01/19/1946    ADMISSION DATE:  06/18/2015 CONSULTATION DATE: 06/18/15   CHIEF COMPLAINT:  Septic shock secondary to ischemic colon. Acute respiratiory failure. Acute renal failure.  INITIAL PRESENTATION: 69 year old male with PMH as below, which is significant for OSA and HTN. 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 He progressed and was discharged. Post discharge course complicated by R leg cellulitis after endoscopic vein harvest he was seen by Dr. Donata Clay in clinic for this 8/3 and was felt to be improving. No complaints noted at that time. Was in his USOH, but was having some constipation. 8/4 PM he took an enema and was straining in bathroom. He became diaphoretic and had loss of consciousness. Wife called EMS. In ED he was noted to be profoundly hypotensive with SOB. Found to be in severe septic shock with ischemic colon s/p total abd colectomy on 8/4  STUDIES:  Echo (8/4) estimated ejection fraction was in the range of 25% to 30%. Severe hypokinesis of the anteroseptal and anterior myocardium. Dyskinesis of the apical myocardium. .  CT chest abd pelvis (8/4) CTA CHEST: Limited CTA of the chest, poor bolus timing.  Moderate to severe cardiomegaly with small pericardial effusion.Evidence of pulmonary edema with small pleural effusions and, patchy airspace opacities most consistent with confluent edema.  CT ABDOMEN AND PELVIS: Focal colonic hepatic flexure pneumatosis without bowel perforation. Moderate to large amount of distal retained large bowel stool. Mild probable ileus, no bowel obstruction.  Bilateral nonobstructing nephrolithiasis measuring up to 4  mm.  SIGNIFICANT EVENTS: 8/4- Total abdominal colectomy with abdominal vac placement 8/6 Exploratory laparotomy, removal of prior packs, closure of open abdomen, ileostomy 8/7 high pressors 8/8 self extubated 8/9  resp distress , required bipap , lasix   SUBJECTIVE:   no resp distress on bipap aFebrile Remains on pressors    VITAL SIGNS: Temp:  [97.3 F (36.3 C)-99.9 F (37.7 C)] 97.6 F (36.4 C) (08/10 0834) Pulse Rate:  [29-122] 83 (08/10 0900) Resp:  [22-56] 35 (08/10 0900) BP: (67-132)/(24-80) 108/56 mmHg (08/10 0900) SpO2:  [88 %-99 %] 94 % (08/10 0900) FiO2 (%):  [45 %] 45 % (08/10 0815) Weight:  [310 lb 13.6 oz (141 kg)] 310 lb 13.6 oz (141 kg) (08/10 0700) HEMODYNAMICS:   VENTILATOR SETTINGS: Vent Mode:  [-]  FiO2 (%):  [45 %] 45 % INTAKE / OUTPUT:  Intake/Output Summary (Last 24 hours) at 06/24/15 0933 Last data filed at 06/24/15 0900  Gross per 24 hour  Intake 1901.92 ml  Output   1225 ml  Net 676.92 ml    PHYSICAL EXAMINATION: General:no resp  Distress, on bipap, anxious Neuro:awake, interactive, non focal HEENT: PERRL, no JVD noted Cardiovascular: Regular Rate and rhythm. No MRG Lungs: Decreased breath sounds antr Abdomen: Soft, non-tender, surgical dressing in place 1+ edema  LABS:  CBC  Recent Labs Lab 06/21/15 0350 06/23/15 0555 06/24/15 0430  WBC 12.9* 10.7* 21.7*  HGB 12.1* 13.0 14.8  HCT 36.5* 40.3 44.9  PLT 160 181 226   Coag's  Recent Labs Lab 06/22/15 0405 06/23/15 0555 06/24/15 0430  APTT 31 31 31   INR 1.37 1.55* 1.83*   BMET  Recent Labs Lab 06/22/15 0405 06/23/15 0555 06/24/15 0430  NA 139 136 139  K 3.8 4.2 4.4  CL 107 105 105  CO2 BUN CREATININE 0.50* 0.53* 0.74  GLUCOSE 138* 133* 165*   Electrolytes  Recent Labs Lab 06/22/15 0405 06/23/15 0555 06/24/15 0430  CALCIUM 7.9* 8.1* 8.3*  MG 1.6* 1.6* 1.7  PHOS 2.3* 2.6 2.9   Sepsis Markers  Recent Labs Lab 06/18/15 1050   06/19/15 0555 06/19/15 0703 06/19/15 1245 06/20/15 0430  LATICACIDVEN  --   < >  --  2.9* 2.4* 2.4*  PROCALCITON 25.47  --  32.23  --   --  15.44  < > = values in this interval not displayed. ABG  Recent Labs Lab 06/19/15 0651 06/22/15 1632 06/23/15 1616  PHART 7.491* 7.349* 7.418  PCO2ART 31.3* 41.7 34.7*  PO2ART 132.0* 70.0* 57.0*   Liver Enzymes  Recent Labs Lab 06/20/15 0555 06/21/15 0350 06/22/15 0405  AST 74* 43* 22  ALT 63 56 36  ALKPHOS 109 101 80  BILITOT 0.8 1.0 0.9  ALBUMIN 1.8* 1.7* 1.5*   Cardiac Enzymes  Recent Labs Lab 06/18/15 0242 06/19/15 1245 06/20/15 0430  TROPONINI 0.04* 0.11* 0.10*   Glucose  Recent Labs Lab 06/23/15 1216 06/23/15 1601 06/23/15 1922 06/23/15 2339 06/24/15 0341 06/24/15 0832  GLUCAP 125* 89 130* 149* 164* 160*    Imaging No results found.   ASSESSMENT / PLAN:  PULMONARY A: Acute resp failure, post -op OSA ETT 8/4 >>8/8 Episode of resp distress 8/9 - ? 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  Replete lytes as needed Rpt Lasix x1   GASTROINTESTINAL A:  s/p total colectomy for ischemic bowel Will return to OR for ileostomy and possible closure 8/6. P:   Ct TNA , do not advance PO Protonix for SUP  HEMATOLOGIC A: Leucocytosis P:  Transfusion only if HB <7  INFECTIOUS A:  Septic shock from ischemic bowel. All cultures are negative to date.  Leucocytosis P:   Abx: Ceftaz 8/4>> Vanco 8/5>> 8/6 Flagyl 8/4>>  Diflucan 8/4>>    ENDOCRINE A:  Hyperglycemia without h/o DM P:  SSI   NEUROLOGIC A: Pain, Anxiety  deconditioning P:   fent int PT consult [place] elow dose ativan for anxiety   FAMILY  - Updates: Wife [name] updated 8/5, sister  8/7 - Inter-disciplinary family meet or Palliative Care meeting due by: 8/12   Summary -  Cause of resp distress not clear, some concern about belly with rising WBC, remains on pressors & yet to regain bowel function  The patient is critically ill with multiple organ systems failure and requires high complexity decision making for assessment and support, frequent evaluation and titration of therapies, application of advanced monitoring technologies and extensive interpretation of multiple databases. Critical Care Time devoted to patient care services described in this note independent of APP time is 31 minutes.    Cyril Mourning MD. Tonny Bollman. [facility] Pager [phone] If no response call [phone]    06/24/2015, 9:33 AM

## 2015-06-24 NOTE — Progress Notes (Addendum)
PARENTERAL NUTRITION CONSULT NOTE - FOLLOW UP  Pharmacy Consult for TPN Indication: Prolonged ileus  Allergies  Allergen Reactions  . Levaquin [Levofloxacin] Nausea Only  . Penicillins Hives  . Doxycycline Rash    Patient Measurements: Height:  (175.3 cm) Weight: (!) 310 lb 13.6 oz (141 kg) IBW/kg (Calculated) : 70.7 Adjusted Body Weight:  Usual Weight:   Vital Signs: Temp: 97.6 F (36.4 C) (08/10 0834) Temp Source: Oral (08/10 0352) BP: 102/70 mmHg (08/10 0815) Pulse Rate: 85 (08/10 0815) Intake/Output from previous day: 08/09 0701 - 08/10 0700 In: 2088.3 [I.V.:158.3; NG/GT:30; IV Piggyback:700; TPN:1200] Out: 1205 [Urine:1105; Drains:75; Stool:25] Intake/Output from this shift:    Labs:  Recent Labs  06/22/15 0405 06/23/15 0555 06/24/15 0430  WBC  --  10.7* 21.7*  HGB  --  13.0 14.8  HCT  --  40.3 44.9  PLT  --  181 226  APTT INR 1.37 1.55* 1.83*     Recent Labs  06/22/15 0405 06/23/15 0555 06/24/15 0430  NA 139 136 139  K 3.8 4.2 4.4  CL 107 105 105  CO2 GLUCOSE 138* 133* 165*  BUN CREATININE 0.50* 0.53* 0.74  CALCIUM 7.9* 8.1* 8.3*  MG 1.6* 1.6* 1.7  PHOS 2.3* 2.6 2.9  PROT 4.1*  --   --   ALBUMIN 1.5*  --   --   AST 22  --   --   ALT 36  --   --   ALKPHOS 80  --   --   BILITOT 0.9  --   --   PREALBUMIN 2.8*  --   --   TRIG 53  --   --    Estimated Creatinine Clearance: 121.8 mL/min (by C-G formula based on Cr of 0.74).    Recent Labs  06/23/15 1922 06/23/15 2339 06/24/15 0341  GLUCAP 130* 149* 164*    Medications:  Scheduled:  . antiseptic oral rinse  7 mL Mouth Rinse QID  . cefTAZidime (FORTAZ)  IV  2 g Intravenous 3 times per day  . chlorhexidine gluconate  15 mL Mouth Rinse BID  . fluconazole (DIFLUCAN) IV  400 mg Intravenous Q24H  . heparin  5,000 Units Subcutaneous 3 times per day  . insulin aspart  2-6 Units Subcutaneous 6 times per day  . metronidazole  500 mg Intravenous Q8H  .  pantoprazole (PROTONIX) IV  40 mg Intravenous QHS  . sodium chloride  500 mL Intravenous Once    Insulin Requirements in the past 24 hours:  8 units ICU-SSI since rate increase last PM, no insulin in TPN  Current Nutrition:  NPO  Assessment:  69 y/o morbidly obese M recently under went CABG 05/22/2015 with post-op R leg cellulitis, afib. He came to ED 8/4 after losing consciousness on toilet. Pt was hypotensive. K=2.5.  Surgeries/Procedures: 8 8/4: Total abdominal colectomy 8/6: return to OR for ileostomy and abdominal closure  GI:  Morbid obesity, LBM 06/18/15.  Abd soft, appropriately tender.  Open abdomen, (+)VAC. NGT out with no nausea. CLs last PM, but NPO again (concern for asp'n).  PPI-IV Endo: no h/o DM. CBGs 130-164 and increased SSI needs with rate advancement Renal: AKI- resolved. Cr < 1. UOP 0.14ml/kg/hr, I/O (+)1L.  Mg 1.7- 2g on 8/9, K 4.4-last K supp 8/8.  NS at [facility] Pulm: OSA, Self extubated 8/8.  [place]-4L --> BiPAP d/t inc rr o/n Cards: HTN, 8/4 EF 35-40%, ICM.  Septic shock 2nd ischemic colon.  Vasopressin off.  Remains on NE, rate up from last PM. Hepatobil: INR 1.83- increasing on no Coumadin. LFTs & TBili wnl. TG 53.  Prealbumin 2.8- decreased Neuro: Anxiety. Inc confusion/lethargy last PM- hypovent vs OSA.  Fent drip, Midaz prn [id number] shock secondary to ischemic colon.  WBC 21.7- large jump. PCT 15.4 (8/6). Afebrile. 8/4 blood cx are negative/final. Concern for asp'n with CLs last PM & inc rr/WBC.  Fortaz/Flagyl/Fluconazole  Best Practices: MC, SQ heparin (CBC WNL), IV PPI TPN Access: 8/4 Triple lumen CVC TPN start date: 8/5 >>  Nutritional Goals:  -2300-2500 kCal, 145-155 grams of protein per day per RD 8/9, adjustment in goals with extubation -Goal Clinimix (E) 5/15 at 126ml/hr would provide 120g protein and 1704 kcal/day (this does not include IV fat emulsion)  Plan:  - Change to Clinimix E 5/20 and advance rate to 100 ml/hr to provide 83%  minimum protein and 92% minimum caloric needs - Add insulin 10 units to bag - No IVFE for the first 7 days of TPN while in the ICU per ASPEN/SCCM guidelines (D#6). - Anticipate need to change back to Clinimix-E 5/15 with addition of lipids on 8/12.  - Daily multivitamin and trace elements - TPN labs qMon/Thurs  Marisue Humble, PharmD Clinical Pharmacist [place] [facility]- [facility]

## 2015-06-24 NOTE — Progress Notes (Signed)
Patient ID: Jonathan Le, male   DOB: 12/09/1945, 69 y.o.   MRN: [id number] 4 Days Post-Op  Subjective: Pt with increased RR overnight.  RN concerned about need for intubation, but doing better with Bipap.  No nausea with NGT out  Objective: Vital signs in last 24 hours: Temp:  [97.3 F (36.3 C)-99.9 F (37.7 C)] 98.4 F (36.9 C) (08/10 0352) Pulse Rate:  [29-122] 85 (08/10 0700) Resp:  [20-56] 38 (08/10 0700) BP: (67-132)/(24-80) 122/64 mmHg (08/10 0700) SpO2:  [88 %-99 %] 96 % (08/10 0700) Weight:  [141 kg (310 lb 13.6 oz)] 141 kg (310 lb 13.6 oz) (08/10 0700) Last BM Date: 06/23/15  Intake/Output from previous day: 08/09 0701 - 08/10 0700 In: 2088.3 [I.V.:158.3; NG/GT:30; IV Piggyback:700; TPN:1200] Out: 1205 [Urine:1105; Drains:75; Stool:25] Intake/Output this shift:    PE: Abd: soft, appropriately tender, few BS, obese, midline wound with VAC present, ostomy with succus present.  Lab Results:   Recent Labs  06/23/15 0555 06/24/15 0430  WBC 10.7* 21.7*  HGB 13.0 14.8  HCT 40.3 44.9  PLT 181 226   BMET  Recent Labs  06/23/15 0555 06/24/15 0430  NA 136 139  K 4.2 4.4  CL 105 105  CO2 24 25  GLUCOSE 133* 165*  BUN 12 20  CREATININE 0.53* 0.74  CALCIUM 8.1* 8.3*   PT/INR  Recent Labs  06/23/15 0555 06/24/15 0430  LABPROT 18.7* 21.1*  INR 1.55* 1.83*   CMP     Component Value Date/Time   NA 139 06/24/2015 0430   K 4.4 06/24/2015 0430   CL 105 06/24/2015 0430   CO2 25 06/24/2015 0430   GLUCOSE 165* 06/24/2015 0430   BUN 20 06/24/2015 0430   CREATININE 0.74 06/24/2015 0430   CALCIUM 8.3* 06/24/2015 0430   PROT 4.1* 06/22/2015 0405   ALBUMIN 1.5* 06/22/2015 0405   AST 22 06/22/2015 0405   ALT 36 06/22/2015 0405   ALKPHOS 80 06/22/2015 0405   BILITOT 0.9 06/22/2015 0405   GFRNONAA >60 06/24/2015 0430   GFRAA >60 06/24/2015 0430   Lipase     Component Value Date/Time   LIPASE 15* 05/19/2015 2140       Studies/Results: Dg Chest  Port 1 View  06/23/2015   CLINICAL DATA:  Follow-up of respiratory failure  EXAM: PORTABLE CHEST - 1 VIEW  COMPARISON:  Portable chest x-ray of June 22, 2015  FINDINGS: The lungs are reasonably well inflated. The interstitial markings are less prominent today. There is subsegmental atelectasis in the left perihilar region which is more stable. There is better visualization of the lateral aspect of the left hemidiaphragm. The cardiac silhouette remains quite enlarged. The central pulmonary vascularity though mildly prominent is more distinct today. The left internal jugular venous catheter tip projects at the junction of the right and left brachiocephalic veins. The nasogastric tube tip projects below the inferior margin of the image. There is 7 intact sternal wires from previous CABG.  IMPRESSION: Improving interstitial edema. New left perihilar subsegmental atelectasis. Stable enlargement of the cardiac silhouette.   Electronically Signed   By: David  [place] M.D.   On: 06/23/2015 07:37   Dg Chest Port 1 View  06/22/2015   CLINICAL DATA:  Acute respiratory failure.  EXAM: PORTABLE CHEST - 1 VIEW  COMPARISON:  One-view chest x-ray 06/21/2015  FINDINGS: The heart is enlarged. The endotracheal tube terminates 3 cm above the carina, in satisfactory position. An NG tube courses off the inferior border of the  film. A left IJ line is stable, terminating within the innominate vein. The mild edema and a probable small left effusion is unchanged. Bibasilar airspace disease likely reflects atelectasis, worse on the left.  IMPRESSION: 1. Stable cardiomegaly and mild edema compatible with congestive heart failure. 2. Mild bibasilar airspace disease likely reflects atelectasis and effusions. 3. Support apparatus is stable.   Electronically Signed   By: Marin Roberts M.D.   On: 06/22/2015 09:29    Anti-infectives: Anti-infectives    Start     Dose/Rate Route Frequency Ordered Stop   06/20/15 2200  cefTAZidime  (FORTAZ) 2 g in dextrose 5 % 50 mL IVPB     2 g 100 mL/hr over 30 Minutes Intravenous 3 times per day 06/20/15 1537     06/20/15 0200  vancomycin (VANCOCIN) IVPB 1000 mg/200 mL premix  Status:  Discontinued     1,000 mg 200 mL/hr over 60 Minutes Intravenous Every 12 hours 06/19/15 1512 06/20/15 0737   06/19/15 1200  vancomycin (VANCOCIN) 1,750 mg in sodium chloride 0.9 % 500 mL IVPB  Status:  Discontinued     1,750 mg 250 mL/hr over 120 Minutes Intravenous Every 24 hours 06/18/15 1030 06/19/15 1512   06/18/15 1200  fluconazole (DIFLUCAN) IVPB 400 mg     400 mg 100 mL/hr over 120 Minutes Intravenous Every 24 hours 06/18/15 1050     06/18/15 1130  vancomycin (VANCOCIN) 2,500 mg in sodium chloride 0.9 % 500 mL IVPB     2,500 mg 250 mL/hr over 120 Minutes Intravenous  Once 06/18/15 1030 06/18/15 1314   06/18/15 1130  cefTAZidime (FORTAZ) 2 g in dextrose 5 % 50 mL IVPB  Status:  Discontinued     2 g 100 mL/hr over 30 Minutes Intravenous Every 12 hours 06/18/15 1030 06/20/15 1537   06/18/15 1030  metroNIDAZOLE (FLAGYL) IVPB 500 mg     500 mg 100 mL/hr over 60 Minutes Intravenous Every 8 hours 06/18/15 1000         Assessment/Plan   POD 6,4, s/p ex lap with subtotal colectomy, open abdomen, take back with creation of ileostomy and closure -gave clears yesterday, but RR went up to 50s overnight and WBC up to 21.7K today. ? Aspiration.  Will check PCXR and decrease to NPO x ice chips and few sips until we can rule this out -VAC changes to midline wound, TRS -mobilize as able.  -pulm toilet, on BiPap right now, per CCM -cont TNA for now, hopefully can wean this as his diet is advanced DVT prophylaxis -SCDs/Heparin   LOS: 6 days    Ottie Neglia E 06/24/2015, 7:53 AM Pager: [phone]

## 2015-06-24 NOTE — Progress Notes (Signed)
Patient is resting comfortably on CPAP with O2 sats 93%. Patient refuses BIPAP and states that he " can't stand it". RT tried to convince him to try it but patient still refused. RT will continue to monitor.

## 2015-06-24 NOTE — Anesthesia Postprocedure Evaluation (Signed)
  Anesthesia Post-op Note  Patient: Jonathan Le  Procedure(s) Performed: Procedure(s): EXPLORATORY LAPAROTOMY (N/A)  Patient Location: SICU  Anesthesia Type:General  Level of Consciousness: sedated and Patient remains intubated per anesthesia plan  Airway and Oxygen Therapy: Patient remains intubated per anesthesia plan and Patient placed on Ventilator (see vital sign flow sheet for setting)  Post-op Pain: none  Post-op Assessment: Post-op Vital signs reviewed, Patient's Cardiovascular Status Stable, Respiratory Function Stable and Patent Airway              Post-op Vital Signs: stable  Last Vitals:  Filed Vitals:   06/24/15 0834  BP:   Pulse:   Temp: 36.4 C  Resp:     Complications: No apparent anesthesia complications

## 2015-06-24 NOTE — Progress Notes (Signed)
PT Cancellation Note  Patient Details Name: Jonathan Le MRN: [id number] DOB: [birth year]   Cancelled Treatment:    Reason Eval/Treat Not Completed: Medical issues which prohibited therapy Patient's respiratory rate in mid 40s at rest. Spoke with RN. Do not feel it would be appropriate to exert patient and exacerbate respiratory status at this time. Will follow-up for comprehensive PT evaluation tomorrow as patient is able.  Berton Mount 06/24/2015, 3:52 PM [name], [place] [phone]

## 2015-06-24 NOTE — Progress Notes (Signed)
Patient refusing BiPAP at this time, so offered for him to wear his CPAP while napping to see if it would help with his sats. Made him aware that if he continued to work hard then we would have to put it back on. Trying to make him comfortable with his CPAP. RN aware.

## 2015-06-24 NOTE — Plan of Care (Signed)
Problem: Phase I Progression Outcomes Goal: Vital signs/hemodynamically stable Outcome: Progressing Pt still requiring Levo

## 2015-06-25 LAB — CBC
HCT: 38.4 % — ABNORMAL LOW (ref 39.0–52.0)
Hemoglobin: 12.6 g/dL — ABNORMAL LOW (ref 13.0–17.0)
MCH: 28.7 pg (ref 26.0–34.0)
MCHC: 32.8 g/dL (ref 30.0–36.0)
MCV: 87.5 fL (ref 78.0–100.0)
PLATELETS: 167 10*3/uL (ref 150–400)
RBC: 4.39 MIL/uL (ref 4.22–5.81)
RDW: 15.3 % (ref 11.5–15.5)
WBC: 19.8 10*3/uL — AB (ref 4.0–10.5)

## 2015-06-25 LAB — APTT: APTT: 32 s (ref 24–37)

## 2015-06-25 LAB — GLUCOSE, CAPILLARY
GLUCOSE-CAPILLARY: 155 mg/dL — AB (ref 65–99)
Glucose-Capillary: 142 mg/dL — ABNORMAL HIGH (ref 65–99)
Glucose-Capillary: 115 mg/dL — ABNORMAL HIGH (ref 65–99)
Glucose-Capillary: 147 mg/dL — ABNORMAL HIGH (ref 65–99)
Glucose-Capillary: 141 mg/dL — ABNORMAL HIGH (ref 65–99)

## 2015-06-25 LAB — COMPREHENSIVE METABOLIC PANEL
ALK PHOS: 75 U/L (ref 38–126)
ALT: 14 U/L — ABNORMAL LOW (ref 17–63)
AST: 21 U/L (ref 15–41)
Albumin: 1.1 g/dL — ABNORMAL LOW (ref 3.5–5.0)
Anion gap: 7 (ref 5–15)
BILIRUBIN TOTAL: 1 mg/dL (ref 0.3–1.2)
BUN: 22 mg/dL — ABNORMAL HIGH (ref 6–20)
CHLORIDE: 106 mmol/L (ref 101–111)
CO2: 28 mmol/L (ref 22–32)
CREATININE: 0.56 mg/dL — AB (ref 0.61–1.24)
Calcium: 8.4 mg/dL — ABNORMAL LOW (ref 8.9–10.3)
GFR calc Af Amer: >60 mL/min (ref >60)
GFR calc non Af Amer: >60 mL/min (ref >60)
Glucose, Bld: 131 mg/dL — ABNORMAL HIGH (ref 65–99)
Potassium: 3.7 mmol/L (ref 3.5–5.1)
Sodium: 141 mmol/L (ref 135–145)
Total Protein: 4 g/dL — ABNORMAL LOW (ref 6.5–8.1)

## 2015-06-25 LAB — PROTIME-INR
INR: 1.73 — AB (ref 0.00–1.49)
Prothrombin Time: 20.2 seconds — ABNORMAL HIGH (ref 11.6–15.2)

## 2015-06-25 LAB — MAGNESIUM: Magnesium: 1.7 mg/dL (ref 1.7–2.4)

## 2015-06-25 LAB — PHOSPHORUS: Phosphorus: 3.1 mg/dL (ref 2.5–4.6)

## 2015-06-25 MED ORDER — TRACE MINERALS CR-CU-MN-SE-ZN 10-1000-500-60 MCG/ML IV SOLN
INTRAVENOUS | Status: DC
  Filled 2015-06-25: qty 2880

## 2015-06-25 MED ORDER — RESOURCE THICKENUP CLEAR PO POWD
ORAL | Status: DC | PRN
  Filled 2015-06-25 (×2): qty 125

## 2015-06-25 MED ORDER — FUROSEMIDE 10 MG/ML IJ SOLN
40.0000 mg | Freq: Once | INTRAMUSCULAR | Status: AC
  Administered 2015-06-25: 40 mg via INTRAVENOUS
  Filled 2015-06-25: qty 4

## 2015-06-25 MED ORDER — MAGNESIUM SULFATE 2 GM/50ML IV SOLN
2.0000 g | Freq: Once | INTRAVENOUS | Status: AC
  Administered 2015-06-25: 2 g via INTRAVENOUS
  Filled 2015-06-25: qty 50

## 2015-06-25 MED ORDER — ENOXAPARIN SODIUM 30 MG/0.3ML ~~LOC~~ SOLN
30.0000 mg | SUBCUTANEOUS | Status: DC
  Administered 2015-06-25 – 2015-07-08 (×14): 30 mg via SUBCUTANEOUS
  Filled 2015-06-25 (×14): qty 0.3

## 2015-06-25 MED ORDER — POTASSIUM CHLORIDE 10 MEQ/50ML IV SOLN
10.0000 meq | INTRAVENOUS | Status: AC
  Administered 2015-06-25 (×3): 10 meq via INTRAVENOUS
  Filled 2015-06-25 (×3): qty 50

## 2015-06-25 MED ORDER — TRACE MINERALS CR-CU-MN-SE-ZN 10-1000-500-60 MCG/ML IV SOLN
INTRAVENOUS | Status: AC
  Administered 2015-06-25: 18:00:00 via INTRAVENOUS
  Filled 2015-06-25: qty 2400

## 2015-06-25 MED ORDER — INSULIN REGULAR HUMAN 100 UNIT/ML IJ SOLN
INTRAMUSCULAR | Status: AC
  Filled 2015-06-25: qty 2400

## 2015-06-25 NOTE — Progress Notes (Signed)
PARENTERAL NUTRITION CONSULT NOTE - FOLLOW UP  Pharmacy Consult for TPN Indication: Prolonged ileus  Allergies  Allergen Reactions  . Levaquin [Levofloxacin] Nausea Only  . Penicillins Hives  . Doxycycline Rash    Patient Measurements: Height:  (175.3 cm) Weight: (!) 316 lb 9.3 oz (143.6 kg) IBW/kg (Calculated) : 70.7  Vital Signs: Temp: 97.4 F (36.3 C) (08/11 0400) Temp Source: Oral (08/11 0400) BP: 103/53 mmHg (08/11 0700) Pulse Rate: 77 (08/11 0700) Intake/Output from previous day: 08/10 0701 - 08/11 0700 In: 2762.8 [P.O.:290; I.V.:152.2; IV Piggyback:500; TPN:1820.7] Out: 2320 [Urine:2190; Drains:120; Stool:10] Intake/Output from this shift:    Labs:  Recent Labs  06/23/15 0555 06/24/15 0430 06/25/15 0430  WBC 10.7* 21.7* 19.8*  HGB 13.0 14.8 12.6*  HCT 40.3 44.9 38.4*  PLT 181 226 167  APTT 31 31 32  INR 1.55* 1.83* 1.73*     Recent Labs  06/23/15 0555 06/24/15 0430 06/25/15 0430  NA 136 139 141  K 4.2 4.4 3.7  CL 105 105 106  CO2 GLUCOSE 133* 165* 131*  BUN 12 20 22*  CREATININE 0.53* 0.74 0.56*  CALCIUM 8.1* 8.3* 8.4*  MG 1.6* 1.7 1.7  PHOS 2.6 2.9 3.1  PROT  --   --  4.0*  ALBUMIN  --   --  1.1*  AST  --   --  21  ALT  --   --  14*  ALKPHOS  --   --  75  BILITOT  --   --  1.0   Estimated Creatinine Clearance: 123.1 mL/min (by C-G formula based on Cr of 0.56).    Recent Labs  06/24/15 1719 06/24/15 2345 06/25/15 0400  GLUCAP 156* 152* 142*   Insulin Requirements in the past 24 hours:  8 units SSI since 1800 last night + 10 units in TPN  Current Nutrition:  Clinimix E5/20 at 100 mL/hr   Assessment:  69 y/o morbidly obese M recently under went CABG 05/22/2015 with post-op R leg cellulitis, afib. He came to ED 8/4 after losing consciousness on toilet. Pt was hypotensive. K=2.5.  Surgeries/Procedures:  8/4: Total abdominal colectomy 8/6: return to OR for ileostomy and abdominal closure  GI: Morbid obesity,  LBM 06/23/15 - Open abdomen, (+)VAC.NGT out with no nausea. Remains NPO with plans to check a swallow study today and possibly advance diet. PPI IV Endo: No hx DM - CBGs at goal Renal:AKI- resolved.Cr < 1. UOP 0.28ml/kg/hr - K 3.7, Mg 1.7, Phos 3.1 Pulm: OSA, Self extubated 8/8. [place]-4L --> BiPAP d/t inc rr o/n Cards: HTN, 8/4 EF 35-40%, ICM.Septic shock 2nd ischemic colon BP 103/53, HR 77 - norepi gtt, vasopressin off Hepatobil: LFTs WNL, INR 1.73 (no coumadin), TG 53.  Prealbumin 2.8- decreased Neuro: A&O, GCS 15, RASS 0 ZO:[id number] shock secondary to ischemic colon on ceftaz/flagyl/fluconazole - Tmax 100.9, WBC 19.8, PCT 15.4 (8/6), 8/4 blood cx are negative/final. Concern for asp'n with CLs last PM & inc rr/WBC. Best Practices: [facility], SQ heparin (CBC WNL), IV PPI TPN Access: 8/4 Triple lumen CVC TPN start date: 8/5 >>  Nutritional Goals: per RD 8/9 2300-2500 kCal/day 145-155 grams protein/day   Plan:  - Continue clinimix E5/20 at 100 ml/hr - no changes made as attempting to advance diet today depending on swallow eval - Daily MVI and trace elements in TPN - No IVFE for the first 7 days of TPN while in the ICU per ASPEN/SCCM guidelines (D#7) - Replace magnesium 2gm IV  x 1 - Replace potassium 10 meq IV Q1H x 3 runs - F/u AM BMET, Mg, Phos  Lysle Pearl, PharmD, BCPS Pager # [phone] 06/25/2015 8:00 AM

## 2015-06-25 NOTE — Progress Notes (Signed)
ANTIBIOTIC CONSULT NOTE - F/U  Pharmacy Consult for fortaz, fluconazole Indication: r/o sepsis  Allergies  Allergen Reactions  . Levaquin [Levofloxacin] Nausea Only  . Penicillins Hives  . Doxycycline Rash    Patient Measurements: Height: 5\' 9"  (175.3 cm) Weight: (!) 316 lb 9.3 oz (143.6 kg) IBW/kg (Calculated) : 70.7  Vital Signs: Temp: 97.8 F (36.6 C) (08/11 0750) Temp Source: Oral (08/11 0750) BP: 93/53 mmHg (08/11 0930) Pulse Rate: 86 (08/11 0930) Intake/Output from previous day: 08/10 0701 - 08/11 0700 In: 2862.8 [P.O.:290; I.V.:152.2; IV Piggyback:500; TPN:1920.7] Out: 2320 [Urine:2190; Drains:120; Stool:10] Intake/Output from this shift: Total I/O In: 100 [TPN:100] Out: 200 [Urine:200]  Labs:  Recent Labs  06/23/15 0555 06/24/15 0430 06/25/15 0430  WBC 10.7* 21.7* 19.8*  HGB 13.0 14.8 12.6*  PLT 181 226 167  CREATININE 0.53* 0.74 0.56*   Estimated Creatinine Clearance: 123.1 mL/min (by C-G formula based on Cr of 0.56). No results for input(s): VANCOTROUGH, VANCOPEAK, VANCORANDOM, GENTTROUGH, GENTPEAK, GENTRANDOM, TOBRATROUGH, TOBRAPEAK, TOBRARND, AMIKACINPEAK, AMIKACINTROU, AMIKACIN in the last 72 hours.   Microbiology: Recent Results (from the past 720 hour(s))  Culture, blood (routine x 2)     Status: None   Collection Time: 06/18/15  6:53 AM  Result Value Ref Range Status   Specimen Description BLOOD RIGHT HAND  Final   Special Requests BOTTLES DRAWN AEROBIC AND ANAEROBIC 5CC  Final   Culture NO GROWTH 5 DAYS  Final   Report Status 06/23/2015 FINAL  Final  Culture, blood (routine x 2)     Status: None   Collection Time: 06/18/15  6:59 AM  Result Value Ref Range Status   Specimen Description BLOOD LEFT HAND  Final   Special Requests BOTTLES DRAWN AEROBIC ONLY 5CC  Final   Culture NO GROWTH 5 DAYS  Final   Report Status 06/23/2015 FINAL  Final  C difficile quick scan w PCR reflex     Status: None   Collection Time: 06/18/15 11:12 AM  Result  Value Ref Range Status   C Diff antigen NEGATIVE NEGATIVE Final   C Diff toxin NEGATIVE NEGATIVE Final   C Diff interpretation Negative for toxigenic C. difficile  Final    Medical History: Past Medical History  Diagnosis Date  . Anxiety   . Hypertension   . Arthritis   . Sleep apnea     wears CPAP nightly  . Glaucoma     Medications:  Prescriptions prior to admission  Medication Sig Dispense Refill Last Dose  . acetaminophen (TYLENOL) 500 MG tablet Take 1,500 mg by mouth every 6 (six) hours as needed for headache.   06/17/2015 at Unknown time  . ALPRAZolam (XANAX) 1 MG tablet Take 1 tablet (1 mg total) by mouth 2 (two) times daily. 10 tablet 0 06/17/2015 at Unknown time  . amiodarone (PACERONE) 200 MG tablet Take 1 tablet (200 mg total) by mouth daily. 30 tablet 1 06/17/2015 at Unknown time  . aspirin EC 325 MG EC tablet Take 1 tablet (325 mg total) by mouth daily. 30 tablet 0 06/17/2015 at Unknown time  . atorvastatin (LIPITOR) 80 MG tablet Take 1 tablet (80 mg total) by mouth daily at 6 PM. 30 tablet 3 06/17/2015 at Unknown time  . clindamycin (CLEOCIN) 300 MG capsule Take 1 capsule (300 mg total) by mouth 3 (three) times daily. 30 capsule 0 06/17/2015 at Unknown time  . finasteride (PROSCAR) 5 MG tablet Take 5 mg by mouth daily.   06/17/2015 at Unknown time  .  furosemide (LASIX) 40 MG tablet TAKE 1 TABLET BY MOUTH DAILY 90 tablet 0 06/17/2015 at Unknown time  . lactulose, encephalopathy, (CHRONULAC) 10 GM/15ML SOLN Take 20 g by mouth daily as needed (for constipation).   06/17/2015 at Unknown time  . lisinopril (PRINIVIL,ZESTRIL) 5 MG tablet Take 1 tablet (5 mg total) by mouth daily. 30 tablet 3 06/17/2015 at Unknown time  . metolazone (ZAROXOLYN) 5 MG tablet TAKE 1 TABLET BY MOUTH EVERY DAY 90 tablet 0 06/17/2015 at Unknown time  . potassium chloride SA (K-DUR,KLOR-CON) 20 MEQ tablet TAKE 1 TABLET BY MOUTH DAILY 90 tablet 0 06/17/2015 at Unknown time  . sodium phosphate (FLEET) enema Place 1 enema  rectally once. follow package directions   06/17/2015 at Unknown time   Scheduled:  . antiseptic oral rinse  7 mL Mouth Rinse QID  . cefTAZidime (FORTAZ)  IV  2 g Intravenous 3 times per day  . chlorhexidine gluconate  15 mL Mouth Rinse BID  . enoxaparin (LOVENOX) injection  30 mg Subcutaneous Q24H  . insulin aspart  2-6 Units Subcutaneous 6 times per day  . pantoprazole (PROTONIX) IV  40 mg Intravenous QHS  . potassium chloride  10 mEq Intravenous Q1 Hr x 3  . sodium chloride  500 mL Intravenous Once  . sodium chloride  10-40 mL Intracatheter Q12H    Assessment: 69 yo male with recent CABG(7/8) and cellulitis readmitted 8/4 with septic shock (per MD- likely from an intra-abdominal source due to either ischemic or infectious colitis).    Infectious Disease: septic shock from ischemic bowel.  WBC= 19.8 afebrile, Noted on clindamycin PTA  8/4 fortaz>> 8/4 vanco>> 8/6 8/4 flagyl>>8/11 8/4 fluconazole >>8/11  8/4 blood x2 8/5 c-diff neg  Plan:  -Continue Fortaz 2gm IV q8h  -F/U clinical status, LOT  Isaac Bliss, PharmD, BCPS Clinical Pharmacist Pager [phone] 06/25/2015 10:47 AM

## 2015-06-25 NOTE — Progress Notes (Signed)
Patient states that he has not worn the BIPAP today and does not want to.  Pt is wearing home CPAP and resting comfortably with O2 Sat 98%. RT will continue to monitor.

## 2015-06-25 NOTE — Evaluation (Signed)
Clinical/Bedside Swallow Evaluation Patient Details  Name: Jonathan Le MRN: [id number] Date of Birth: Jun 17, 1946  Today's Date: 06/25/2015 Time: SLP Start Time (ACUTE ONLY): 1350 SLP Stop Time (ACUTE ONLY): 1409 SLP Time Calculation (min) (ACUTE ONLY): 19 min  Past Medical History:  Past Medical History  Diagnosis Date  . Anxiety   . Hypertension   . Arthritis   . Sleep apnea     wears CPAP nightly  . Glaucoma    Past Surgical History:  Past Surgical History  Procedure Laterality Date  . Hernia repair      right  . Eye surgery      bil cataract  . Wisdom tooth extraction    . Fasciotomy foot / toe Left     foot  . Cardiac catheterization N/A 05/20/2015    Procedure: Left Heart Cath and Coronary Angiography;  Surgeon: Kathleene Hazel, MD;  Location[facility] INVASIVE CV LAB;  Service: Cardiovascular;  Laterality: N/A;  . Coronary artery bypass graft N/A 05/22/2015    Procedure: CORONARY ARTERY BYPASS GRAFTING (CABG), ON PUMP, TIMES THREE, USING LEFT INTERNAL MAMMARY ARTERY, RIGHT GREATER SAPHENOUS VEIN HARVESTED ENDOSCOPICALLY;  Surgeon: Kerin Perna, MD;  Location: [facility] OR;  Service: Open Heart Surgery;  Laterality: N/A;  LIMA-LAD, SVG-OM, SVG-PD  . Tee without cardioversion N/A 05/22/2015    Procedure: TRANSESOPHAGEAL ECHOCARDIOGRAM (TEE);  Surgeon: Kerin Perna, MD;  Location: [facility] OR;  Service: Open Heart Surgery;  Laterality: N/A;  . Laparotomy N/A 06/18/2015    Procedure: EXPLORATORY LAPAROTOMY;  Surgeon: Emelia Loron, MD;  Location: [facility] OR;  Service: General;  Laterality: N/A;  . Laparotomy N/A 06/20/2015    Procedure: WASHOUT OF ABDOMEN, CLOSURE OF ABDOMINAL WALL WITH PLACEMENT OF WOUND VAC AND ILLEOSTOMY ;  Surgeon: Almond Lint, MD;  Location: [facility] OR;  Service: General;  Laterality: N/A;   HPI:  69 year old male with PMH of OSA and HTN. 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 Underwent CABG 05/22/2015 with brief ICU  stay post operatively. 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 Post discharge course complicated by R leg cellulitis after endoscopic vein harvest he was seen by Dr. Donata Clay in clinic for this,was having some constipation. 8/4 PM he took an enema and was straining in bathroom. He became diaphoretic and had loss of consciousness. In ED he was noted to be profoundly hypotensive with SOB. Found to be in severe septic shock with ischemic colon s/p total abd colectomy on 8/4, intubated, 8/6 ilieostomy; self-extubated 8/8.  Started on clear liquids 8/9. Episode of respiratory distress 8/9, ? fluid overload vs aspiration. CXR 8/10; Increased density at both lung bases consistent with progressive.    Assessment / Plan / Recommendation Clinical Impression  Swallow evaluation complete. Patient presents with a suspected acute reversible dysphagia s/p 5 day intubation (with self extubation) with decreased airway protection noted following consumption of thin liquids. Solids and nectar thick liquid trials resulted in what appears to be improved airway protection. Will initiate thickened liquids and f/u at bedside for potential upgrade. Prognosis to advance diet good with time off ventilator and improved vocal quality indicating decreased pharyngeal irritation, possible edema.     Aspiration Risk  Moderate    Diet Recommendation Nectar (clear liquids (per RN), thickened to nectar thick)   Medication Administration: Whole meds with puree Compensations: Slow rate;Small sips/bites    Other  Recommendations Oral Care  Recommendations: Oral care BID Other Recommendations: Order thickener from pharmacy;Prohibited food (jello, ice cream, thin soups);Remove water pitcher   Follow Up Recommendations       Frequency and Duration min 3x week  2 weeks   Pertinent Vitals/Pain n/a     Swallow Study Prior Functional Status  Type of Home: House Available Help at Discharge:  Family;Available 24 hours/day    General Other Pertinent Information: 69 year old male with PMH of OSA and HTN. 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 Post discharge course complicated by R leg cellulitis after endoscopic vein harvest he was seen by Dr. Donata Clay in clinic for this,was having some constipation. 8/4 PM he took an enema and was straining in bathroom. He became diaphoretic and had loss of consciousness. In ED he was noted to be profoundly hypotensive with SOB. Found to be in severe septic shock with ischemic colon s/p total abd colectomy on 8/4, intubated, 8/6 ilieostomy; self-extubated 8/8.  Started on clear liquids 8/9. Episode of respiratory distress 8/9, ? fluid overload vs aspiration. CXR 8/10; Increased density at both lung bases consistent with progressive.  Type of Study: Bedside swallow evaluation Previous Swallow Assessment: none Diet Prior to this Study: NPO Temperature Spikes Noted: No Respiratory Status: Supplemental O2 delivered via (comment) (nasal cannula) History of Recent Intubation: Yes Length of Intubations (days): 5 days Date extubated: 06/22/15 Behavior/Cognition: Alert;Confused;Pleasant mood;Cooperative Oral Cavity - Dentition: Adequate natural dentition/normal for age Self-Feeding Abilities: Able to feed self Patient Positioning: Upright in chair/Tumbleform Baseline Vocal Quality: Hoarse;Breathy;Low vocal intensity Volitional Cough: Strong Volitional Swallow: Able to elicit    Oral/Motor/Sensory Function Overall Oral Motor/Sensory Function: Other (comment) (generalized weakness)   Ice Chips Ice chips: Within functional limits Presentation: Spoon   Thin Liquid Thin Liquid: Impaired Presentation: Cup;Self Fed Pharyngeal  Phase Impairments: Cough - Immediate    Nectar Thick  Nectar Thick Liquid: Within functional limits Presentation: Cup;Self Fed   Honey Thick Honey Thick Liquid: Not tested   Puree Puree: Within functional limits Presentation: Spoon;Self Fed   Solid   GO    Solid: Impaired Presentation: Self Fed Oral Phase Impairments: Impaired anterior to posterior transit Oral Phase Functional Implications:  (prolonged oral transit, increased WOB)      Caleb Decock MA, CCC-SLP [phone]  Rufus Beske Meryl 06/25/2015,2:24 PM

## 2015-06-25 NOTE — Evaluation (Signed)
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 Pt also with recent CABG and continues with sternal precautions. Overall, mostly limited by respiratory status and arthritis. Patient will benefit from skilled PT to increase their independence and safety with mobility to allow discharge to the venue listed below.       Follow Up Recommendations Home health PT;Supervision for mobility/OOB    Equipment Recommendations  None recommended by PT    Recommendations for Other Services OT consult     Precautions / Restrictions Precautions Precautions: Sternal;Fall (VAC abd; ileostomy) Precaution Comments: Pt only able to state "no pulling", reviewed precautions with patient Restrictions Weight Bearing Restrictions:  (sternal precautions)      Mobility  Bed Mobility Overal bed mobility: Needs Assistance;+2 for physical assistance;+ 2 for safety/equipment Bed Mobility: Supine to Sit     Supine to sit: Mod assist;+2 for physical assistance;HOB elevated;+2 for safety/equipment     General bed mobility comments: HOB elevated and turned to sit at EOB to decr respiratory strain  Transfers Overall transfer level: Needs assistance Equipment used: Rolling walker (2 wheeled);2 person hand held assist Transfers: Sit to/from Stand Sit to Stand: Min assist;+2 physical assistance;+2 safety/equipment         General transfer comment: vc for technique; no device pt anxious and letting go of PT/techs' hands and reaching for IV pole; returned to sit and used RW next attempt   Ambulation/Gait Ambulation/Gait assistance: Min  assist;+2 physical assistance;+2 safety/equipment Ambulation Distance (Feet): 3 Feet Assistive device: Rolling walker (2 wheeled) Gait Pattern/deviations: Step-to pattern;Shuffle;Trunk flexed Gait velocity: Decreased Gait velocity interpretation: <1.8 ft/sec, indicative of risk for recurrent falls General Gait Details: Walked to recliner with shuffling steps; incr RR and ? decr SaO2 (although once seated, very quickly incr to 96% and likely artifact)  Stairs            Wheelchair Mobility    Modified Rankin (Stroke Patients Only)       Balance Overall balance assessment: Needs assistance         Standing balance support: Bilateral upper extremity supported Standing balance-Leahy Scale: Poor                               Pertinent Vitals/Pain On CPAP on arrival; Dr. Vassie Loll present and asked RN to switch to [place] O2 for mobility On 6L pt with RR up to 32; SaO2 with ?brief drop to 85% during transfer to chair, however recovered to 96% in less than 30 seconds (?accurate reading)  Pain Assessment: Faces Faces Pain Scale: Hurts a little bit Pain Location: abd Pain Intervention(s): Limited activity within patient's tolerance;Monitored during session;Repositioned    Home Living Family/patient expects to be discharged to:: Private residence Living Arrangements: Spouse/significant other Available Help at Discharge: Family;Available 24 hours/day Type of Home: House Home Access: Stairs to enter   [employer] of Steps: 2 Home Layout: Two level;Bed/bath upstairs;Able to live on main level with bedroom/bathroom Home Equipment: Walker - 4 wheels      Prior Function Level of Independence: Independent with assistive device(s)  Comments: using rollator; mostly limited by back and knee pain; denied need for assist with ADLs; reports was no longer receiveing HH therapies     Hand Dominance        Extremity/Trunk Assessment   Upper Extremity  Assessment: Overall WFL for tasks assessed (adhering to sternal precautions)           Lower Extremity Assessment: Overall WFL for tasks assessed      Cervical / Trunk Assessment: Kyphotic  Communication   Communication: No difficulties  Cognition Arousal/Alertness: Awake/alert Behavior During Therapy: WFL for tasks assessed/performed Overall Cognitive Status: Within Functional Limits for tasks assessed                      General Comments      Exercises General Exercises - Lower Extremity Ankle Circles/Pumps: AROM;Both;10 reps Quad Sets: AROM;Both;5 reps;Limitations Quad Sets Limitations: pt wit difficulty understanding and doing more a hamstring contraction Short Arc Quad: AROM;Both;10 reps      Assessment/Plan    PT Assessment Patient needs continued PT services  PT Diagnosis Difficulty walking;Generalized weakness;Acute pain   PT Problem List Decreased strength;Decreased activity tolerance;Decreased balance;Pain;Decreased mobility;Cardiopulmonary status limiting activity;Decreased knowledge of use of DME;Decreased knowledge of precautions;Obesity;Decreased skin integrity  PT Treatment Interventions Gait training;Stair training;DME instruction;Functional mobility training;Therapeutic activities;Therapeutic exercise;Balance training;Patient/family education   PT Goals (Current goals can be found in the Care Plan section) Acute Rehab PT Goals 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 PT Goal Formulation: With patient Time For Goal Achievement: 07/02/15 Potential to Achieve Goals: Good    Frequency Min 3X/week   Barriers to discharge        Co-evaluation               End of Session Equipment Utilized During Treatment: Gait belt;Oxygen Activity Tolerance: Patient limited by fatigue (incr RR ) Patient left: in chair;with call bell/phone within reach Nurse Communication: Mobility status         Time: 2440-1027 PT  Time Calculation (min) (ACUTE ONLY): 31 min   Charges:   PT Evaluation $Initial PT Evaluation Tier I: 1 Procedure PT Treatments $Therapeutic Activity: 8-22 mins   PT G Codes:        Jonathan Le 2015-07-25, 11:23 AM Pager [phone]

## 2015-06-25 NOTE — Care Management Important Message (Signed)
Important Message  Patient Details  Name: Jonathan Le MRN: [id number] Date of Birth: 1946-07-09   Medicare Important Message Given:  Yes-third notification given    Kyla Balzarine 06/25/2015, 2:42 PMImportant Message  Patient Details  Name: Jonathan Le MRN: [id number] Date of Birth: Mar 27, 1946   Medicare Important Message Given:  Yes-third notification given    Kyla Balzarine 06/25/2015, 2:42 PM

## 2015-06-25 NOTE — Progress Notes (Signed)
Patient ID: Jonathan Le, male   DOB: May 31, 1946, 69 y.o.   MRN: [id number] 5 Days Post-Op  Subjective: Pt is very hungry.  Doing better on cpap this morning.    Objective: Vital signs in last 24 hours: Temp:  [97.1 F (36.2 C)-100.9 F (38.3 C)] 97.4 F (36.3 C) (08/11 0400) Pulse Rate:  [39-93] 77 (08/11 0700) Resp:  [16-39] 34 (08/11 0700) BP: (87-132)/(48-76) 103/53 mmHg (08/11 0700) SpO2:  [88 %-100 %] 94 % (08/11 0700) FiO2 (%):  [45 %] 45 % (08/10 0815) Weight:  [143.6 kg (316 lb 9.3 oz)] 143.6 kg (316 lb 9.3 oz) (08/11 0500) Last BM Date: 06/23/15  Intake/Output from previous day: 08/10 0701 - 08/11 0700 In: 2762.8 [P.O.:290; I.V.:152.2; IV Piggyback:500; TPN:1820.7] Out: 2320 [Urine:2190; Drains:120; Stool:10] Intake/Output this shift:    PE: Abd: soft, appropriately tender, +BS, ileostomy with succus and stoma is pink and viable.  Midline wound with VAC, output is cloudy  Lab Results:   Recent Labs  06/24/15 0430 06/25/15 0430  WBC 21.7* 19.8*  HGB 14.8 12.6*  HCT 44.9 38.4*  PLT 226 167   BMET  Recent Labs  06/24/15 0430 06/25/15 0430  NA 139 141  K 4.4 3.7  CL 105 106  CO2 25 28  GLUCOSE 165* 131*  BUN 20 22*  CREATININE 0.74 0.56*  CALCIUM 8.3* 8.4*   PT/INR  Recent Labs  06/24/15 0430 06/25/15 0430  LABPROT 21.1* 20.2*  INR 1.83* 1.73*   CMP     Component Value Date/Time   NA 141 06/25/2015 0430   K 3.7 06/25/2015 0430   CL 106 06/25/2015 0430   CO2 28 06/25/2015 0430   GLUCOSE 131* 06/25/2015 0430   BUN 22* 06/25/2015 0430   CREATININE 0.56* 06/25/2015 0430   CALCIUM 8.4* 06/25/2015 0430   PROT 4.0* 06/25/2015 0430   ALBUMIN 1.1* 06/25/2015 0430   AST 21 06/25/2015 0430   ALT 14* 06/25/2015 0430   ALKPHOS 75 06/25/2015 0430   BILITOT 1.0 06/25/2015 0430   GFRNONAA >60 06/25/2015 0430   GFRAA >60 06/25/2015 0430   Lipase     Component Value Date/Time   LIPASE 15* 05/19/2015 2140       Studies/Results: Dg  Chest Port 1 View  06/24/2015   CLINICAL DATA:  Elevated white blood cell count, possible aspiration, shock.  EXAM: PORTABLE CHEST - 1 VIEW  COMPARISON:  Portable chest x-ray of June 23, 2015  FINDINGS: There is increased density at the right lung base as compared to the previous study. The retrocardiac region on the left is more dense and the left hemidiaphragm less well demonstrated. The cardiac silhouette remains enlarged. The central pulmonary vascularity is prominent. There are post CABG changes. The esophagogastric tube has been removed. The left internal jugular venous catheter projects over the proximal SVC.  IMPRESSION: Increased density at both lung bases consistent with progressive atelectasis or pneumonia. Stable low-grade CHF   Electronically Signed   By: David  [place] M.D.   On: 06/24/2015 09:59    Anti-infectives: Anti-infectives    Start     Dose/Rate Route Frequency Ordered Stop   06/20/15 2200  cefTAZidime (FORTAZ) 2 g in dextrose 5 % 50 mL IVPB     2 g 100 mL/hr over 30 Minutes Intravenous 3 times per day 06/20/15 1537     06/20/15 0200  vancomycin (VANCOCIN) IVPB 1000 mg/200 mL premix  Status:  Discontinued     1,000 mg 200 mL/hr over  60 Minutes Intravenous Every 12 hours 06/19/15 1512 06/20/15 0737   06/19/15 1200  vancomycin (VANCOCIN) 1,750 mg in sodium chloride 0.9 % 500 mL IVPB  Status:  Discontinued     1,750 mg 250 mL/hr over 120 Minutes Intravenous Every 24 hours 06/18/15 1030 06/19/15 1512   06/18/15 1200  fluconazole (DIFLUCAN) IVPB 400 mg     400 mg 100 mL/hr over 120 Minutes Intravenous Every 24 hours 06/18/15 1050     06/18/15 1130  vancomycin (VANCOCIN) 2,500 mg in sodium chloride 0.9 % 500 mL IVPB     2,500 mg 250 mL/hr over 120 Minutes Intravenous  Once 06/18/15 1030 06/18/15 1314   06/18/15 1130  cefTAZidime (FORTAZ) 2 g in dextrose 5 % 50 mL IVPB  Status:  Discontinued     2 g 100 mL/hr over 30 Minutes Intravenous Every 12 hours 06/18/15 1030 06/20/15  1537   06/18/15 1030  metroNIDAZOLE (FLAGYL) IVPB 500 mg     500 mg 100 mL/hr over 60 Minutes Intravenous Every 8 hours 06/18/15 1000         Assessment/Plan   POD 7,5, s/p ex lap with subtotal colectomy, open abdomen, take back with creation of ileostomy and closure -patient is hungry.  CXR shows possible atelectasis vs aspiration.  Will check swallow study this morning.  If he passes, then he can be restarted on clear liquids, possibly even fulls. -VAC changes to midline wound, TRS -mobilize as able.  -pulm toilet -cont TNA for now, hopefully can wean this as his diet is advanced Leukocytosis -WBC 19K today.  Ran fever overnight of 100.9.  Abdomen is stable and working well.  ? If this is secondary to lungs.  Will defer that to CCM. -duration of abx therapy per CCM.  Usually a 7 days course post op is enough for his gut. DVT prophylaxis -SCDs/Heparin    LOS: 7 days    Craige Patel E 06/25/2015, 7:55 AM Pager: [phone]

## 2015-06-25 NOTE — Progress Notes (Signed)
Utilization review completed.  

## 2015-06-25 NOTE — Progress Notes (Signed)
PULMONARY / CRITICAL CARE MEDICINE   Name: Jonathan Le MRN: [id number] DOB: 11/16/1945    ADMISSION DATE:  06/18/2015 CONSULTATION DATE: 06/18/15   CHIEF COMPLAINT:  Septic shock secondary to ischemic colon. Acute respiratiory failure. Acute renal failure.  INITIAL PRESENTATION: 69 year old male with PMH of OSA and HTN. 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 Post discharge course complicated by R leg cellulitis after endoscopic vein harvest he was seen by Dr. Donata Clay in clinic for this,was having some constipation. 8/4 PM he took an enema and was straining in bathroom. He became diaphoretic and had loss of consciousness. In ED he was noted to be profoundly hypotensive with SOB. Found to be in severe septic shock with ischemic colon s/p total abd colectomy on 8/4  STUDIES:  Echo (8/4) estimated ejection fraction was in the range of 25% to 30%. Severe hypokinesis of the anteroseptal and anterior myocardium. Dyskinesis of the apical myocardium. .  CT chest abd pelvis (8/4) CTA CHEST: Limited CTA of the chest, poor bolus timing.  Moderate to severe cardiomegaly with small pericardial effusion.Evidence of pulmonary edema with small pleural effusions and, patchy airspace opacities most consistent with confluent edema.  CT ABDOMEN AND PELVIS: Focal colonic hepatic flexure pneumatosis without bowel perforation. Moderate to large amount of distal retained large bowel stool. Mild probable ileus, no bowel obstruction.  Bilateral nonobstructing nephrolithiasis measuring up to 4 mm.  SIGNIFICANT EVENTS: 8/4- Total abdominal colectomy with abdominal vac placement 8/6 Exploratory laparotomy, removal of prior packs, closure of open abdomen, ileostomy 8/7 high pressors 8/8 self extubated 8/9  resp distress  , required bipap , lasix   SUBJECTIVE:   On CPAP aFebrile Remains on low dose levo   VITAL SIGNS: Temp:  [97.1 F (36.2 C)-100.9 F (38.3 C)] 97.8 F (36.6 C) (08/11 0750) Pulse Rate:  [39-93] 89 (08/11 0830) Resp:  [16-39] 23 (08/11 0830) BP: (87-132)/(48-76) 132/71 mmHg (08/11 0830) SpO2:  [88 %-100 %] 98 % (08/11 0830) Weight:  [316 lb 9.3 oz (143.6 kg)] 316 lb 9.3 oz (143.6 kg) (08/11 0500) HEMODYNAMICS: CVP:  [9 mmHg-15 mmHg] 15 mmHg VENTILATOR SETTINGS:   INTAKE / OUTPUT:  Intake/Output Summary (Last 24 hours) at 06/25/15 0918 Last data filed at 06/25/15 0800  Gross per 24 hour  Intake 2894.94 ml  Output   2395 ml  Net 499.94 ml    PHYSICAL EXAMINATION: General: no resp  Distress, on cpap, anxious Neuro:awake, interactive, non focal HEENT: PERRL, no JVD noted Cardiovascular: Regular Rate and rhythm. No MRG Lungs: Decreased breath sounds antr Abdomen: Soft, non-tender, surgical dressing in place 2+ edema  LABS:  CBC  Recent Labs Lab 06/23/15 0555 06/24/15 0430 06/25/15 0430  WBC 10.7* 21.7* 19.8*  HGB 13.0 14.8 12.6*  HCT 40.3 44.9 38.4*  PLT 181 226 167   Coag's  Recent Labs Lab 06/23/15 0555 06/24/15 0430 06/25/15 0430  APTT 31 31 32  INR 1.55* 1.83* 1.73*   BMET  Recent Labs Lab 06/23/15 0555 06/24/15 0430 06/25/15 0430  NA 136 139 141  K 4.2 4.4 3.7  CL 105 105 106  CO2 BUN 12 20 22*  CREATININE 0.53* 0.74 0.56*  GLUCOSE 133* 165* 131*   Electrolytes  Recent Labs Lab 06/23/15 0555 06/24/15 0430 06/25/15 0430  CALCIUM 8.1* 8.3* 8.4*  MG 1.6* 1.7 1.7  PHOS 2.6 2.9 3.1   Sepsis Markers  Recent Labs Lab 06/18/15 1050  06/19/15 0555 06/19/15 0703 06/19/15 1245 06/20/15 0430  LATICACIDVEN  --   < >  --  2.9* 2.4* 2.4*  PROCALCITON 25.47  --  32.23  --   --  15.44  < > = values in this interval not displayed. ABG  Recent Labs Lab 06/19/15 0651 06/22/15 1632 06/23/15 1616  PHART 7.491* 7.349*  7.418  PCO2ART 31.3* 41.7 34.7*  PO2ART 132.0* 70.0* 57.0*   Liver Enzymes  Recent Labs Lab 06/21/15 0350 06/22/15 0405 06/25/15 0430  AST 43* 22 21  ALT 56 36 14*  ALKPHOS 101 80 75  BILITOT 1.0 0.9 1.0  ALBUMIN 1.7* 1.5* 1.1*   Cardiac Enzymes  Recent Labs Lab 06/19/15 1245 06/20/15 0430  TROPONINI 0.11* 0.10*   Glucose  Recent Labs Lab 06/24/15 0832 06/24/15 1235 06/24/15 1719 06/24/15 2345 06/25/15 0400 06/25/15 0740  GLUCAP 160* 149* 156* 152* 142* 115*    Imaging No results found.   ASSESSMENT / PLAN:  PULMONARY A: Acute resp failure, post -op OSA ETT 8/4 >>8/8 Episode of resp distress 8/9 - ? 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  Replete lytes as needed Rpt Lasix x1 -more aggressive once off levo  GASTROINTESTINAL A:  s/p total colectomy for ischemic bowel S/p  ileostomy  P:   Ct TNA ,advance PO as tolerates Protonix for SUP  HEMATOLOGIC A: Leucocytosis P:  Transfusion only if HB <7  INFECTIOUS A:  Septic shock from ischemic bowel. All cultures are negative to date.  Leucocytosis -trending down P:   Abx: Ceftaz 8/4>> Vanco 8/5>> 8/6 Flagyl 8/4>> 8/11 Diflucan 8/4>>8/11  Ct Abx   ENDOCRINE A:  Hyperglycemia without h/o DM P:  SSI   NEUROLOGIC A: Pain, Anxiety  deconditioning P:   fent int PT consult Use low dose ativan for anxiety   FAMILY  - Updates: Wife [name] updated 8/5, sister 8/7 - Inter-disciplinary family meet or Palliative Care meeting due by: 8/12   Summary -  Cause of resp distress not clear, WBC trending back down, remains on pressors & bowel function slow to recover  The patient is critically ill with multiple organ systems failure and requires  high complexity decision making for assessment and support, frequent evaluation and titration of therapies, application of advanced monitoring technologies and extensive interpretation of multiple databases. Critical Care Time devoted to patient care services described in this note independent of APP time is 31 minutes.    Cyril Mourning MD. Tonny Bollman. [place] [facility] Pager [phone] If no response call [phone]    06/25/2015, 9:18 AM

## 2015-06-26 ENCOUNTER — Inpatient Hospital Stay (HOSPITAL_COMMUNITY): Payer: Medicare Other

## 2015-06-26 ENCOUNTER — Encounter (HOSPITAL_COMMUNITY): Payer: Self-pay

## 2015-06-26 LAB — MAGNESIUM: MAGNESIUM: 1.8 mg/dL (ref 1.7–2.4)

## 2015-06-26 LAB — GLUCOSE, CAPILLARY
GLUCOSE-CAPILLARY: 126 mg/dL — AB (ref 65–99)
GLUCOSE-CAPILLARY: 129 mg/dL — AB (ref 65–99)
GLUCOSE-CAPILLARY: 132 mg/dL — AB (ref 65–99)
GLUCOSE-CAPILLARY: 137 mg/dL — AB (ref 65–99)
Glucose-Capillary: 121 mg/dL — ABNORMAL HIGH (ref 65–99)
Glucose-Capillary: 141 mg/dL — ABNORMAL HIGH (ref 65–99)

## 2015-06-26 LAB — BASIC METABOLIC PANEL
Anion gap: 5 (ref 5–15)
BUN: 21 mg/dL — ABNORMAL HIGH (ref 6–20)
CO2: 30 mmol/L (ref 22–32)
Calcium: 8.4 mg/dL — ABNORMAL LOW (ref 8.9–10.3)
Chloride: 105 mmol/L (ref 101–111)
Creatinine, Ser: 0.54 mg/dL — ABNORMAL LOW (ref 0.61–1.24)
GFR calc Af Amer: >60 mL/min (ref >60)
Glucose, Bld: 132 mg/dL — ABNORMAL HIGH (ref 65–99)
POTASSIUM: 4.1 mmol/L (ref 3.5–5.1)
SODIUM: 140 mmol/L (ref 135–145)

## 2015-06-26 LAB — CBC
HCT: 36.5 % — ABNORMAL LOW (ref 39.0–52.0)
Hemoglobin: 12.1 g/dL — ABNORMAL LOW (ref 13.0–17.0)
MCH: 28.6 pg (ref 26.0–34.0)
MCHC: 33.2 g/dL (ref 30.0–36.0)
MCV: 86.3 fL (ref 78.0–100.0)
Platelets: 179 10*3/uL (ref 150–400)
RBC: 4.23 MIL/uL (ref 4.22–5.81)
RDW: 15.4 % (ref 11.5–15.5)
WBC: 20.5 10*3/uL — ABNORMAL HIGH (ref 4.0–10.5)

## 2015-06-26 LAB — PROTIME-INR
INR: 1.46 (ref 0.00–1.49)
Prothrombin Time: 17.8 seconds — ABNORMAL HIGH (ref 11.6–15.2)

## 2015-06-26 LAB — APTT: aPTT: 30 seconds (ref 24–37)

## 2015-06-26 LAB — PHOSPHORUS: PHOSPHORUS: 3.3 mg/dL (ref 2.5–4.6)

## 2015-06-26 MED ORDER — TRACE MINERALS CR-CU-MN-SE-ZN 10-1000-500-60 MCG/ML IV SOLN
INTRAVENOUS | Status: DC
  Administered 2015-06-26: 18:00:00 via INTRAVENOUS
  Filled 2015-06-26: qty 1440

## 2015-06-26 MED ORDER — IOHEXOL 300 MG/ML  SOLN
25.0000 mL | INTRAMUSCULAR | Status: AC
  Administered 2015-06-26 (×2): 25 mL via ORAL

## 2015-06-26 MED ORDER — MAGNESIUM SULFATE IN D5W 10-5 MG/ML-% IV SOLN
1.0000 g | Freq: Once | INTRAVENOUS | Status: AC
  Administered 2015-06-26: 1 g via INTRAVENOUS
  Filled 2015-06-26 (×2): qty 100

## 2015-06-26 MED ORDER — IOHEXOL 300 MG/ML  SOLN
100.0000 mL | Freq: Once | INTRAMUSCULAR | Status: AC | PRN
  Administered 2015-06-26: 100 mL via INTRAVENOUS

## 2015-06-26 MED ORDER — FUROSEMIDE 10 MG/ML IJ SOLN
40.0000 mg | Freq: Every day | INTRAMUSCULAR | Status: DC
  Administered 2015-06-26: 40 mg via INTRAVENOUS
  Filled 2015-06-26 (×2): qty 4

## 2015-06-26 MED ORDER — TRACE MINERALS CR-CU-MN-SE-ZN 10-1000-500-60 MCG/ML IV SOLN: INTRAVENOUS | Status: DC

## 2015-06-26 NOTE — Progress Notes (Addendum)
PULMONARY / CRITICAL CARE MEDICINE   Name: Jonathan Le MRN: [id number] DOB: February 12, 1946    ADMISSION DATE:  06/18/2015 CONSULTATION DATE: 06/18/15   CHIEF COMPLAINT:  Septic shock secondary to ischemic colon. Acute respiratiory failure. Acute renal failure.  INITIAL PRESENTATION: 69 year old male with PMH of OSA and HTN. 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 Post discharge course complicated by R leg cellulitis after endoscopic vein harvest he was seen by Dr. Donata Clay in clinic for this,was having some constipation. 8/4 PM he took an enema and was straining in bathroom. He became diaphoretic and had loss of consciousness. In ED he was noted to be profoundly hypotensive with SOB. Found to be in severe septic shock with ischemic colon s/p total abd colectomy on 8/4  STUDIES:  Echo (8/4) estimated ejection fraction was in the range of 25% to 30%. Severe hypokinesis of the anteroseptal and anterior myocardium.Dyskinesis of the apical myocardium. .  CT chest abd pelvis (8/4) CTA CHEST: Limited CTA of the chest, poor bolus timing. Moderate to severe cardiomegaly with small pericardial effusion.Evidence of pulmonary edema with small pleural effusions and, patchy airspace opacities most consistent with confluent edema.  CT ABDOMEN AND PELVIS: Focal colonic hepatic flexure pneumatosis without bowel perforation. Moderate to large amount of distal retained large bowel stool. Mild probable ileus, no bowel obstruction.  Bilateral nonobstructing nephrolithiasis measuring up to 4 mm.  CT abdomen 8/12 >>  SIGNIFICANT EVENTS: 8/4- Total abdominal colectomy with abdominal vac placement 8/6 Exploratory laparotomy, removal of prior packs, closure of open abdomen, ileostomy 8/7 high pressors 8/8 self  extubated 8/9  resp distress , required bipap , lasix   SUBJECTIVE:   afebrile Good UO with lasix Remains on low dose levo Used CPAP overnight   VITAL SIGNS: Temp:  [98 F (36.7 C)-99 F (37.2 C)] 98.2 F (36.8 C) (08/12 0700) Pulse Rate:  [67-94] 78 (08/12 0800) Resp:  [16-41] 34 (08/12 0800) BP: (81-140)/(47-101) 112/54 mmHg (08/12 0800) SpO2:  [91 %-100 %] 99 % (08/12 0800) Weight:  [326 lb 15.1 oz (148.3 kg)] 326 lb 15.1 oz (148.3 kg) (08/12 0434) HEMODYNAMICS: CVP:  [9 mmHg-14 mmHg] 14 mmHg VENTILATOR SETTINGS:   INTAKE / OUTPUT:  Intake/Output Summary (Last 24 hours) at 06/26/15 1031 Last data filed at 06/26/15 0800  Gross per 24 hour  Intake   2791 ml  Output   3200 ml  Net   -409 ml    PHYSICAL EXAMINATION: General: no resp  Distress,  anxious Neuro:awake, interactive, non focal HEENT: PERRL, no JVD noted Cardiovascular: Regular Rate and rhythm. No MRG Lungs: Decreased breath sounds antr Abdomen: Soft, non-tender, surgical dressing in place 2+ edema  LABS:  CBC  Recent Labs Lab 06/24/15 0430 06/25/15 0430 06/26/15 0415  WBC 21.7* 19.8* 20.5*  HGB 14.8 12.6* 12.1*  HCT 44.9 38.4* 36.5*  PLT 226 167 179   Coag's  Recent Labs Lab 06/24/15 0430 06/25/15 0430 06/26/15 0415  APTT 31 32 30  INR 1.83* 1.73* 1.46   BMET  Recent Labs Lab 06/24/15 0430 06/25/15 0430 06/26/15 0415  NA 139 141 140  K 4.4 3.7 4.1  CL 105 106 105  CO2 25 28 30   BUN 20 22* 21*  CREATININE 0.74 0.56* 0.54*  GLUCOSE 165* 131* 132*   Electrolytes  Recent Labs Lab 06/24/15 0430 06/25/15 0430 06/26/15 0415  CALCIUM 8.3* 8.4* 8.4*  MG 1.7 1.7 1.8  PHOS 2.9 3.1 3.3   Sepsis Markers  Recent Labs Lab 06/19/15 1245 06/20/15 0430  LATICACIDVEN 2.4* 2.4*  PROCALCITON  --  15.44   ABG  Recent Labs Lab 06/22/15 1632 06/23/15 1616  PHART 7.349* 7.418  PCO2ART 41.7 34.7*  PO2ART 70.0* 57.0*   Liver Enzymes  Recent Labs Lab 06/21/15 0350  06/22/15 0405 06/25/15 0430  AST 43* 22 21  ALT 56 36 14*  ALKPHOS 101 80 75  BILITOT 1.0 0.9 1.0  ALBUMIN 1.7* 1.5* 1.1*   Cardiac Enzymes  Recent Labs Lab 06/19/15 1245 06/20/15 0430  TROPONINI 0.11* 0.10*   Glucose  Recent Labs Lab 06/25/15 0740 06/25/15 1158 06/25/15 1623 06/25/15 1935 06/26/15 0001 06/26/15 0407  GLUCAP 115* 147* 155* 141* 132* 126*    Imaging No results found.   ASSESSMENT / PLAN:  PULMONARY A: Acute resp failure, post -op OSA ETT 8/4 >>8/8 Episode of resp distress 8/9 - ? 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  Replete lytes as needed Lasix 40 q 12h  GASTROINTESTINAL A:  s/p total colectomy for ischemic bowel S/p  ileostomy  P:   Ct TNA ,advance PO per swallow eval if CT abd ok, FEES planned Protonix for SUP  HEMATOLOGIC A: Leucocytosis P:  Transfusion only if HB <7  INFECTIOUS A:  Septic shock from ischemic bowel. All cultures are negative to date.  Leucocytosis -trending down P:   Abx: Ceftaz 8/4>> Vanco 8/5>> 8/6 Flagyl 8/4>> 8/11 Diflucan 8/4>>8/11  Ct Abx x 10 ds, rpt CT abd planned  ENDOCRINE A:  Hyperglycemia without h/o DM P:  SSI   NEUROLOGIC A: Pain, Anxiety  deconditioning P:   fent int PT following Resume xanax for anxiety   FAMILY  - Updates: Wife [name] updated 8/5, sister 8/7 - Inter-disciplinary family meet or Palliative Care meeting due by: NA   Summary -  WBC remains high, remains on pressors & bowel function recovering, CT abdomen to investigate. Once off pressors, can transfer to SDU   The patient is critically ill with multiple organ systems failure and requires high complexity decision making for assessment and support, frequent  evaluation and titration of therapies, application of advanced monitoring technologies and extensive interpretation of multiple databases. Critical Care Time devoted to patient care services described in this note independent of APP time is 31 minutes.     Cyril Mourning MD. Tonny Bollman. [place] [facility] Pager [phone] If no response call [phone]    06/26/2015, 10:31 AM

## 2015-06-26 NOTE — Progress Notes (Signed)
Patient ID: Jonathan Le, male   DOB: 1946/10/04, 69 y.o.   MRN: [id number] 6 Days Post-Op  Subjective: Pt feels well today.  No complaints. Tolerated full liquids yesterday.  Ostomy working well.  Objective: Vital signs in last 24 hours: Temp:  [98 F (36.7 C)-99 F (37.2 C)] 98.9 F (37.2 C) (08/12 0400) Pulse Rate:  [67-94] 81 (08/12 0700) Resp:  [16-41] 38 (08/12 0700) BP: (81-140)/(47-101) 109/63 mmHg (08/12 0700) SpO2:  [91 %-100 %] 97 % (08/12 0700) Weight:  [148.3 kg (326 lb 15.1 oz)] 148.3 kg (326 lb 15.1 oz) (08/12 0434) Last BM Date: 06/23/15  Intake/Output from previous day: 08/11 0701 - 08/12 0700 In: 2776.4 [P.O.:360; I.V.:66.4; IV Piggyback:150; TPN:2200] Out: 3510 [Urine:3210; Drains:300] Intake/Output this shift:    PE: Abd: soft, minimally tender, obese, +BS, ileostomy with thicker pudding like output, midline wound VAC changed yesterday with good granulating tissue. Heart: regular Lungs: Clear anteriorly  Lab Results:   Recent Labs  06/25/15 0430 06/26/15 0415  WBC 19.8* 20.5*  HGB 12.6* 12.1*  HCT 38.4* 36.5*  PLT 167 179   BMET  Recent Labs  06/25/15 0430 06/26/15 0415  NA 141 140  K 3.7 4.1  CL 106 105  CO2 28 30  GLUCOSE 131* 132*  BUN 22* 21*  CREATININE 0.56* 0.54*  CALCIUM 8.4* 8.4*   PT/INR  Recent Labs  06/25/15 0430 06/26/15 0415  LABPROT 20.2* 17.8*  INR 1.73* 1.46   CMP     Component Value Date/Time   NA 140 06/26/2015 0415   K 4.1 06/26/2015 0415   CL 105 06/26/2015 0415   CO2 30 06/26/2015 0415   GLUCOSE 132* 06/26/2015 0415   BUN 21* 06/26/2015 0415   CREATININE 0.54* 06/26/2015 0415   CALCIUM 8.4* 06/26/2015 0415   PROT 4.0* 06/25/2015 0430   ALBUMIN 1.1* 06/25/2015 0430   AST 21 06/25/2015 0430   ALT 14* 06/25/2015 0430   ALKPHOS 75 06/25/2015 0430   BILITOT 1.0 06/25/2015 0430   GFRNONAA >60 06/26/2015 0415   GFRAA >60 06/26/2015 0415   Lipase     Component Value Date/Time   LIPASE 15*  05/19/2015 2140       Studies/Results: No results found.  Anti-infectives: Anti-infectives    Start     Dose/Rate Route Frequency Ordered Stop   06/20/15 2200  cefTAZidime (FORTAZ) 2 g in dextrose 5 % 50 mL IVPB     2 g 100 mL/hr over 30 Minutes Intravenous 3 times per day 06/20/15 1537     06/20/15 0200  vancomycin (VANCOCIN) IVPB 1000 mg/200 mL premix  Status:  Discontinued     1,000 mg 200 mL/hr over 60 Minutes Intravenous Every 12 hours 06/19/15 1512 06/20/15 0737   06/19/15 1200  vancomycin (VANCOCIN) 1,750 mg in sodium chloride 0.9 % 500 mL IVPB  Status:  Discontinued     1,750 mg 250 mL/hr over 120 Minutes Intravenous Every 24 hours 06/18/15 1030 06/19/15 1512   06/18/15 1200  fluconazole (DIFLUCAN) IVPB 400 mg  Status:  Discontinued     400 mg 100 mL/hr over 120 Minutes Intravenous Every 24 hours 06/18/15 1050 06/25/15 0925   06/18/15 1130  vancomycin (VANCOCIN) 2,500 mg in sodium chloride 0.9 % 500 mL IVPB     2,500 mg 250 mL/hr over 120 Minutes Intravenous  Once 06/18/15 1030 06/18/15 1314   06/18/15 1130  cefTAZidime (FORTAZ) 2 g in dextrose 5 % 50 mL IVPB  Status:  Discontinued  2 g 100 mL/hr over 30 Minutes Intravenous Every 12 hours 06/18/15 1030 06/20/15 1537   06/18/15 1030  metroNIDAZOLE (FLAGYL) IVPB 500 mg  Status:  Discontinued     500 mg 100 mL/hr over 60 Minutes Intravenous Every 8 hours 06/18/15 1000 06/25/15 0925       Assessment/Plan   POD 8,6, s/p ex lap with subtotal colectomy, open abdomen, take back with creation of ileostomy and closure -tolerating full liquids -will go ahead and CT abdomen to rule out complication given WBC still 20K.  His abdomen is actually doing quite well and would be surprised if this is the source -VAC changes to midline wound, TRS -mobilize as able.  -pulm toilet -begin to wean TNA Leukocytosis -WBC 20.5K today. see above plan DVT prophylaxis -SCDs/Heparin    LOS: 8 days    Donie Moulton E 06/26/2015,  8:38 AM Pager: [phone]

## 2015-06-26 NOTE — Progress Notes (Addendum)
PARENTERAL NUTRITION CONSULT NOTE - FOLLOW UP  Pharmacy Consult for TPN Indication: Prolonged ileus  Allergies  Allergen Reactions  . Levaquin [Levofloxacin] Nausea Only  . Penicillins Hives  . Doxycycline Rash    Patient Measurements: Height:  (175.3 cm) Weight: (!) 326 lb 15.1 oz (148.3 kg) IBW/kg (Calculated) : 70.7  Vital Signs: Temp: 98.2 F (36.8 C) (08/12 0700) Temp Source: Oral (08/12 0400) BP: 112/54 mmHg (08/12 0800) Pulse Rate: 78 (08/12 0800) Intake/Output from previous day: 08/11 0701 - 08/12 0700 In: 2977.3 [P.O.:360; I.V.:67.3; IV Piggyback:250; TPN:2300] Out: 3580 [Urine:3280; Drains:300] Intake/Output from this shift: Total I/O In: 221.9 [P.O.:120; I.V.:1.9; TPN:100] Out: 320 [Urine:70; Stool:250]  Labs:  Recent Labs  06/24/15 0430 06/25/15 0430 06/26/15 0415  WBC 21.7* 19.8* 20.5*  HGB 14.8 12.6* 12.1*  HCT 44.9 38.4* 36.5*  PLT 226 167 179  APTT 31 32 30  INR 1.83* 1.73* 1.46     Recent Labs  06/24/15 0430 06/25/15 0430 06/26/15 0415  NA 139 141 140  K 4.4 3.7 4.1  CL 105 106 105  CO2 GLUCOSE 165* 131* 132*  BUN 20 22* 21*  CREATININE 0.74 0.56* 0.54*  CALCIUM 8.3* 8.4* 8.4*  MG 1.7 1.7 1.8  PHOS 2.9 3.1 3.3  PROT  --  4.0*  --   ALBUMIN  --  1.1*  --   AST  --  21  --   ALT  --  14*  --   ALKPHOS  --  75  --   BILITOT  --  1.0  --    Estimated Creatinine Clearance: 125.4 mL/min (by C-G formula based on Cr of 0.54).    Recent Labs  06/25/15 1935 06/26/15 0001 06/26/15 0407  GLUCAP 141* 132* 126*   Insulin Requirements in the past 24 hours:  10 units in TPN + 10 units SSI  Current Nutrition:  Clinimix E5/20 at 100 mL/hr   Assessment:  69 y/o morbidly obese M recently under went CABG 05/22/2015 with post-op R leg cellulitis, afib. He came to ED 8/4 after losing consciousness on toilet. Pt was hypotensive. K=2.5.  Surgeries/Procedures:  8/4: Total abdominal colectomy 8/6: return to OR for  ileostomy and abdominal closure  GI: Morbid obesity, LBM 06/23/15 - Open abdomen, (+)VAC.NGT out with no nausea.  PPI IV. Diet was advanced to full liquids yesterday. Obtaining CT abdomen to rule out infectious source. Endo: No hx DM - CBGs at goal Renal:AKI- resolved.Cr < 1. UOP 0.38ml/kg/hr - K 3.7, Mg 1.8, Phos 3.3 Pulm: OSA, Self extubated 8/8. [place]-4L > BiPAP > 6L [place] Cards: HTN, 8/4 EF 35-40%, ICM.Septic shock 2nd ischemic colon BP 103/53, HR 77 - norepi gtt @ 2 mcg Hepatobil: LFTs WNL, INR 1.73 (no coumadin), TG 53.  Prealbumin 2.8- decreased Neuro: A&O, GCS 15, RASS 0 ZO:XWRUEA shock secondary to ischemic colon on d#9 ceftaz- Tmax 100.9, WBC 20.5, PCT 15.4 (8/6), 8/4 blood cx are negative.   8/4 fortaz>> 8/4 vanco>> 8/6 8/4 flagyl>>8/11 8/4 fluconazole >>8/11  Best Practices: [facility], SQ heparin (CBC WNL), IV PPI TPN Access: 8/4 Triple lumen CVC TPN start date: 8/5 >>  Nutritional Goals: per RD 8/9 2300-2500 kCal/day 145-155 grams protein/day   Plan:  - Wean TPN -- reduce to clinimix E5/20 at 60 ml/hr, providing 50% protein needs and 55% daily kcal - Insulin regular 10 units in TPN  - Daily MVI and trace elements in TPN - Will continue holding IVFE, originally holding for  first 7 days in ICU, but now weaning TPN  - Replace magnesium 1 gm IV x 1 - F/u duration of ceftaz - F/u diet advancement     Agapito Games, PharmD, BCPS Clinical Pharmacist Pager: [phone] 06/26/2015 9:08 AM

## 2015-06-26 NOTE — Progress Notes (Signed)
Speech Language Pathology Treatment: Dysphagia  Patient Details Name: RYLYNN SCHONEMAN MRN: [id number] DOB: August 28, 1946 Today's Date: 06/26/2015 Time: 4098-1191 SLP Time Calculation (min) (ACUTE ONLY): 13 min  Assessment / Plan / Recommendation Clinical Impression  Pt seen with am meal. Observed to cough at baseline and following nectar and thin POs, though not immediately with swallowing. Impossible to subjectively evaluate pt accurately. Pt needs objective assessment of swallow to rule out aspiration of thin or nectar textures. Will f/u with FEES today.    HPI Other Pertinent Information: 69 year old male with PMH of OSA and HTN. 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 Post discharge course complicated by R leg cellulitis after endoscopic vein harvest he was seen by Dr. Donata Clay in clinic for this,was having some constipation. 8/4 PM he took an enema and was straining in bathroom. He became diaphoretic and had loss of consciousness. In ED he was noted to be profoundly hypotensive with SOB. Found to be in severe septic shock with ischemic colon s/p total abd colectomy on 8/4, intubated, 8/6 ilieostomy; self-extubated 8/8.  Started on clear liquids 8/9. Episode of respiratory distress 8/9, ? fluid overload vs aspiration. CXR 8/10; Increased density at both lung bases consistent with progressive.    Pertinent Vitals    SLP Plan   (FEES)    Recommendations Diet recommendations: Nectar-thick liquid Liquids provided via: Cup Medication Administration: Whole meds with puree Supervision: Patient able to self feed Compensations: Slow rate;Small sips/bites              Oral Care Recommendations: Oral care BID Plan:  (FEES)    GO     Symphanie Cederberg, Riley Nearing 06/26/2015, 11:48 AM

## 2015-06-26 NOTE — Progress Notes (Signed)
Patient needed help putting on the CPAP. RT placed patient on CPAP and bled in 5L of oxygen. Patient's O2 SAT is 93%. RT will continue to monitor.

## 2015-06-26 NOTE — Procedures (Signed)
Objective Swallowing Evaluation:  (FEES)  Patient Details  Name: Jonathan Le MRN: [id number] Date of Birth: Jan 20, 1946  Today's Date: 06/26/2015 Time: SLP Start Time (ACUTE ONLY): 1015-SLP Stop Time (ACUTE ONLY): 1034 SLP Time Calculation (min) (ACUTE ONLY): 19 min  Past Medical History:  Past Medical History  Diagnosis Date  . Anxiety   . Hypertension   . Arthritis   . Sleep apnea     wears CPAP nightly  . Glaucoma    Past Surgical History:  Past Surgical History  Procedure Laterality Date  . Hernia repair      right  . Eye surgery      bil cataract  . Wisdom tooth extraction    . Fasciotomy foot / toe Left     foot  . Cardiac catheterization N/A 05/20/2015    Procedure: Left Heart Cath and Coronary Angiography;  Surgeon: Kathleene Hazel, MD;  Location: [facility] INVASIVE CV LAB;  Service: Cardiovascular;  Laterality: N/A;  . Coronary artery bypass graft N/A 05/22/2015    Procedure: CORONARY ARTERY BYPASS GRAFTING (CABG), ON PUMP, TIMES THREE, USING LEFT INTERNAL MAMMARY ARTERY, RIGHT GREATER SAPHENOUS VEIN HARVESTED ENDOSCOPICALLY;  Surgeon: Kerin Perna, MD;  Location: [facility] OR;  Service: Open Heart Surgery;  Laterality: N/A;  LIMA-LAD, SVG-OM, SVG-PD  . Tee without cardioversion N/A 05/22/2015    Procedure: TRANSESOPHAGEAL ECHOCARDIOGRAM (TEE);  Surgeon: Kerin Perna, MD;  Location: [facility] OR;  Service: Open Heart Surgery;  Laterality: N/A;  . Laparotomy N/A 06/18/2015    Procedure: EXPLORATORY LAPAROTOMY;  Surgeon: Emelia Loron, MD;  Location: [facility] OR;  Service: General;  Laterality: N/A;  . Laparotomy N/A 06/20/2015    Procedure: WASHOUT OF ABDOMEN, CLOSURE OF ABDOMINAL WALL WITH PLACEMENT OF WOUND VAC AND ILLEOSTOMY ;  Surgeon: Almond Lint, MD;  Location: [facility] OR;  Service: General;  Laterality: N/A;   HPI:  Other Pertinent Information: 69 year old male with PMH of OSA and HTN. 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 Post discharge course complicated by R leg cellulitis after endoscopic vein harvest he was seen by Dr. Donata Clay in clinic for this,was having some constipation. 8/4 PM he took an enema and was straining in bathroom. He became diaphoretic and had loss of consciousness. In ED he was noted to be profoundly hypotensive with SOB. Found to be in severe septic shock with ischemic colon s/p total abd colectomy on 8/4, intubated, 8/6 ilieostomy; self-extubated 8/8.  Started on clear liquids 8/9. Episode of respiratory distress 8/9, ? fluid overload vs aspiration. CXR 8/10; Increased density at both lung bases consistent with progressive.   No Data Recorded  Assessment / Plan / Recommendation CHL IP CLINICAL IMPRESSIONS 06/26/2015  Therapy Diagnosis Severe pharyngeal phase dysphagia  Clinical Impression Pt demonstrates severe oropharyngeal dysphagia with silent penetration and aspiration of thin and thickened liquids during the swallow. Pt has general weakness with decreased mobility of hyolaryngeal mechanism and poor epiglottic and airway closure. There are bilateral ulcerations on the posterior 1/3 of glottis contributing to poor airway closure and impaired sensation. Quantity of aspiration not visualized, but penetration to the cords, obvious decreased closure and delayed swallow response all indicative of subglottic aspiration.  Chin tuck ineffective. Recommend pt consume puree/pudding thick liquids. Pt could attempt solids when medically cleared. SLP will follow for  readiness for liquids. Recommend trials of honey thick liquids at bedside. Will likely need repeat FEES.       CHL IP TREATMENT RECOMMENDATION 06/26/2015  Treatment Recommendations Therapy as outlined in treatment plan below     CHL IP DIET RECOMMENDATION 06/26/2015  SLP Diet Recommendations Pudding  Liquid  Administration via (None)  Medication Administration Whole meds with puree  Compensations Slow rate;Small sips/bites;Multiple dry swallows after each bite/sip  Postural Changes and/or Swallow Maneuvers (None)     CHL IP OTHER RECOMMENDATIONS 06/26/2015  Recommended Consults (None)  Oral Care Recommendations Oral care BID  Other Recommendations Order thickener from pharmacy;Prohibited food (jello, ice cream, thin soups);Remove water pitcher     No flowsheet data found.   CHL IP FREQUENCY AND DURATION 06/26/2015  Speech Therapy Frequency (ACUTE ONLY) min 3x week  Treatment Duration 2 weeks     Pertinent Vitals/Pain NA    SLP Swallow Goals No flowsheet data found.  No flowsheet data found.    CHL IP REASON FOR REFERRAL 06/26/2015  Reason for Referral Objectively evaluate swallowing function     CHL IP ORAL PHASE 06/26/2015  Lips (None)  Tongue (None)  Mucous membranes (None)  Nutritional status (None)  Other (None)  Oxygen therapy (None)  Oral Phase WFL  Oral - Pudding Teaspoon (None)  Oral - Pudding Cup (None)  Oral - Honey Teaspoon (None)  Oral - Honey Cup (None)  Oral - Honey Syringe (None)  Oral - Nectar Teaspoon (None)  Oral - Nectar Cup (None)  Oral - Nectar Straw (None)  Oral - Nectar Syringe (None)  Oral - Ice Chips (None)  Oral - Thin Teaspoon (None)  Oral - Thin Cup (None)  Oral - Thin Straw (None)  Oral - Thin Syringe (None)  Oral - Puree (None)  Oral - Mechanical Soft (None)  Oral - Regular (None)  Oral - Multi-consistency (None)  Oral - Pill (None)  Oral Phase - Comment (None)      CHL IP PHARYNGEAL PHASE 06/26/2015  Pharyngeal Phase Impaired  Pharyngeal - Pudding Teaspoon (None)  Penetration/Aspiration details (pudding teaspoon) (None)  Pharyngeal - Pudding Cup (None)  Penetration/Aspiration details (pudding cup) (None)  Pharyngeal - Honey Teaspoon (None)  Penetration/Aspiration details (honey teaspoon) (None)  Pharyngeal - Honey Cup (None)   Penetration/Aspiration details (honey cup) (None)  Pharyngeal - Honey Syringe (None)  Penetration/Aspiration details (honey syringe) (None)  Pharyngeal - Nectar Teaspoon (None)  Penetration/Aspiration details (nectar teaspoon) (None)  Pharyngeal - Nectar Cup (None)  Penetration/Aspiration details (nectar cup) (None)  Pharyngeal - Nectar Straw (None)  Penetration/Aspiration details (nectar straw) (None)  Pharyngeal - Nectar Syringe (None)  Penetration/Aspiration details (nectar syringe) (None)  Pharyngeal - Ice Chips (None)  Penetration/Aspiration details (ice chips) (None)  Pharyngeal - Thin Teaspoon (None)  Penetration/Aspiration details (thin teaspoon) (None)  Pharyngeal - Thin Cup (None)  Penetration/Aspiration details (thin cup) (None)  Pharyngeal - Thin Straw (None)  Penetration/Aspiration details (thin straw) (None)  Pharyngeal - Thin Syringe (None)  Penetration/Aspiration details (thin syringe') (None)  Pharyngeal - Puree (None)  Penetration/Aspiration details (puree) (None)  Pharyngeal - Mechanical Soft (None)  Penetration/Aspiration details (mechanical soft) (None)  Pharyngeal - Regular (None)  Penetration/Aspiration details (regular) (None)  Pharyngeal - Multi-consistency (None)  Penetration/Aspiration details (multi-consistency) (None)  Pharyngeal - Pill (None)  Penetration/Aspiration details (pill) (None)  Pharyngeal Comment (None)      CHL IP CERVICAL ESOPHAGEAL PHASE 06/26/2015  Cervical Esophageal Phase WFL  Pudding Teaspoon (None)  Pudding Cup (None)  Honey  Teaspoon (None)  Honey Cup (None)  Honey Straw (None)  Nectar Teaspoon (None)  Nectar Cup (None)  Nectar Straw (None)  Nectar [place] Cup (None)  Thin Teaspoon (None)  Thin Cup (None)  Thin Straw (None)  Thin [place] Cup (None)  Cervical Esophageal Comment (None)    No flowsheet data found.        Harlon Ditty, [place] CCC-SLP [phone]  Claudine Mouton 06/26/2015, 12:53 PM

## 2015-06-26 NOTE — Progress Notes (Signed)
Nutrition Follow Up  DOCUMENTATION CODES:   Morbid obesity  INTERVENTION:    TPN per pharmacy  NUTRITION DIAGNOSIS:   Inadequate oral intake now related to altered GI function as evidenced by meal completion < 25%, ongoing  GOAL:   Patient will meet greater than or equal to 90% of their needs, progressing  MONITOR:   PO intake, Labs, Weight trends, I & O's  ASSESSMENT:   [name] s/p CABG 7/8 presented to ED 8/4 after losing consciousness on toilet. In ED he was hypoxic requiring NRB and hypotensive. PCCM called for admission.   Patient s/p procedure 8/4: TOTAL ABDOMINAL COLECTOMY ABDOMINAL VAC PLACEMENT  Patient extubated 8/8.  Pt advanced to Full Liquids (thickened to nectar consistency) 8/11.  PO intake minimal.  Surgery note reviewed.  Abdominal wound VAC in place and ostomy viable/functioning.  Patient is receiving TPN with Clinimix E 5/20 @ 60 ml/hr.  No IVFE at this time.  Provides 1267 kcal and 72 grams protein per day.  Meets 55% minimum estimated energy needs and 50% minimum estimated protein needs.  Diet Order:  .TPN (CLINIMIX-E) Adult Diet full liquid Room service appropriate?: Yes; Fluid consistency:: Nectar Thick .TPN (CLINIMIX-E) Adult  Skin:  Wound (see comment) (abdominal wound VAC)  Last BM:  8/9  Height:   Ht Readings from Last 1 Encounters:  06/19/15  (1.753 m)    Weight:   Wt Readings from Last 1 Encounters:  06/26/15 326 lb 15.1 oz (148.3 kg)    Ideal Body Weight:  72.7 kg  BMI:  Body mass index is 48.26 kg/(m^2).  Estimated Nutritional Needs:   Kcal:  2300-2500  Protein:  145-155 gm  Fluid:  per MD  EDUCATION NEEDS:   No education needs identified at this time  Maureen Chatters, RD, LDN Pager #: [phone] After-Hours Pager #: [phone]

## 2015-06-26 NOTE — Progress Notes (Signed)
Patient is resting comfortably on 5L [place] and stated he will put his home CPAP on when he goes to bed. Patient refuses BIPAP at this time. RT will continue to monitor.

## 2015-06-27 DIAGNOSIS — L8990 Pressure ulcer of unspecified site, unspecified stage: Secondary | ICD-10-CM

## 2015-06-27 LAB — APTT: APTT: 30 s (ref 24–37)

## 2015-06-27 LAB — GLUCOSE, CAPILLARY
GLUCOSE-CAPILLARY: 118 mg/dL — AB (ref 65–99)
GLUCOSE-CAPILLARY: 166 mg/dL — AB (ref 65–99)
GLUCOSE-CAPILLARY: 119 mg/dL — AB (ref 65–99)
GLUCOSE-CAPILLARY: 110 mg/dL — AB (ref 65–99)
Glucose-Capillary: 109 mg/dL — ABNORMAL HIGH (ref 65–99)
Glucose-Capillary: 111 mg/dL — ABNORMAL HIGH (ref 65–99)
Glucose-Capillary: 120 mg/dL — ABNORMAL HIGH (ref 65–99)

## 2015-06-27 LAB — BASIC METABOLIC PANEL
ANION GAP: 6 (ref 5–15)
BUN: 17 mg/dL (ref 6–20)
CALCIUM: 8.3 mg/dL — AB (ref 8.9–10.3)
CHLORIDE: 107 mmol/L (ref 101–111)
CO2: 25 mmol/L (ref 22–32)
Creatinine, Ser: 0.48 mg/dL — ABNORMAL LOW (ref 0.61–1.24)
GFR calc Af Amer: >60 mL/min (ref >60)
GFR calc non Af Amer: >60 mL/min (ref >60)
Glucose, Bld: 125 mg/dL — ABNORMAL HIGH (ref 65–99)
Potassium: 4.2 mmol/L (ref 3.5–5.1)
Sodium: 138 mmol/L (ref 135–145)

## 2015-06-27 LAB — PROTIME-INR
INR: 1.4 (ref 0.00–1.49)
Prothrombin Time: 17.2 seconds — ABNORMAL HIGH (ref 11.6–15.2)

## 2015-06-27 LAB — CBC
HCT: 37.7 % — ABNORMAL LOW (ref 39.0–52.0)
Hemoglobin: 12.5 g/dL — ABNORMAL LOW (ref 13.0–17.0)
MCH: 28.7 pg (ref 26.0–34.0)
MCHC: 33.2 g/dL (ref 30.0–36.0)
MCV: 86.7 fL (ref 78.0–100.0)
Platelets: 195 10*3/uL (ref 150–400)
RBC: 4.35 MIL/uL (ref 4.22–5.81)
RDW: 15.9 % — ABNORMAL HIGH (ref 11.5–15.5)
WBC: 24.1 10*3/uL — ABNORMAL HIGH (ref 4.0–10.5)

## 2015-06-27 MED ORDER — FUROSEMIDE 10 MG/ML IJ SOLN
20.0000 mg | Freq: Three times a day (TID) | INTRAMUSCULAR | Status: AC
  Administered 2015-06-27 (×2): 20 mg via INTRAVENOUS
  Filled 2015-06-27 (×2): qty 2

## 2015-06-27 MED ORDER — SODIUM CHLORIDE 0.9 % IV SOLN
INTRAVENOUS | Status: DC
  Administered 2015-06-27: 10 mL/h via INTRAVENOUS

## 2015-06-27 MED ORDER — TRACE MINERALS CR-CU-MN-SE-ZN 10-1000-500-60 MCG/ML IV SOLN: INTRAVENOUS | Status: DC

## 2015-06-27 MED ORDER — TRACE MINERALS CR-CU-MN-SE-ZN 10-1000-500-60 MCG/ML IV SOLN
INTRAVENOUS | Status: AC
  Administered 2015-06-27: 17:00:00 via INTRAVENOUS
  Filled 2015-06-27: qty 720

## 2015-06-27 MED ORDER — INSULIN REGULAR HUMAN 100 UNIT/ML IJ SOLN: INTRAMUSCULAR | Status: DC

## 2015-06-27 NOTE — Progress Notes (Signed)
7 Days Post-Op  Subjective: Confused but pleasant   Objective: Vital signs in last 24 hours: Temp:  [97.7 F (36.5 C)-98.8 F (37.1 C)] 97.7 F (36.5 C) (08/13 0741) Pulse Rate:  [71-106] 83 (08/13 0700) Resp:  [12-45] 40 (08/13 0700) BP: (78-146)/(45-111) 127/71 mmHg (08/13 0700) SpO2:  [89 %-100 %] 97 % (08/13 0700) Weight:  [138.4 kg (305 lb 1.9 oz)] 138.4 kg (305 lb 1.9 oz) (08/13 0600) Last BM Date: 06/23/15  Intake/Output from previous day: 08/12 0701 - 08/13 0700 In: 3845.7 [P.O.:840; I.V.:48.7; IV Piggyback:250; TPN:2707] Out: 5030 [Urine:3280; Drains:100; Stool:1650] Intake/Output this shift:    Incision/Wound:vac in place soft NT  Ileostomy working well    Lab Results:   Recent Labs  06/25/15 0430 06/26/15 0415  WBC 19.8* 20.5*  HGB 12.6* 12.1*  HCT 38.4* 36.5*  PLT 167 179   BMET  Recent Labs  06/26/15 0415 06/27/15 0535  NA 140 138  K 4.1 4.2  CL 105 107  CO2 30 25  GLUCOSE 132* 125*  BUN 21* 17  CREATININE 0.54* 0.48*  CALCIUM 8.4* 8.3*   PT/INR  Recent Labs  06/26/15 0415 06/27/15 0535  LABPROT 17.8* 17.2*  INR 1.46 1.40   ABG No results for input(s): PHART, HCO3 in the last 72 hours.  Invalid input(s): PCO2, PO2  Studies/Results: Ct Abdomen Pelvis W Contrast  06/26/2015   CLINICAL DATA:  Postop total colectomy for ischemic colitis on 06/18/2015 with abdominal wound closure and ileostomy on 06/20/2015. Patient continues to have leukocytosis with blood cell white count of 220 20,000.  EXAM: CT ABDOMEN AND PELVIS WITH CONTRAST  TECHNIQUE: Multidetector CT imaging of the abdomen and pelvis was performed using the standard protocol following bolus administration of intravenous contrast.  CONTRAST:  OMNIPAQUE IOHEXOL 300 MG/ML IV. Oral contrast was also administered.  COMPARISON:  06/18/2015.  FINDINGS: Interval subtotal colectomy, with a residual Hartmann rectal pouch. Ileostomy in the right upper pelvis in the periumbilical region.  No evidence of small bowel obstruction, as the oral contrast progresses throughout the small bowel into the ostomy. Moderately large amount of simple ascites, though the ascites may be loculated as it is confined to the perihepatic and perisplenic regions of the upper abdomen and the lateral portion of both sides of the abdomen in the colectomy spread. No evidence of free intraperitoneal air.  No change in the appearance of the liver, spleen, pancreas, adrenal glands and kidneys. Nonobstructing right upper pole and left lower pole renal calculi again noted. High attenuation material within the gallbladder without evidence of significant gallbladder distention. No biliary ductal dilation.  Urinary bladder decompressed by Foley catheter. Marked prostate gland enlargement as noted previously. Left inguinal hernia containing fat.  Edema in the subcutaneous fat of the anterior abdominal wall. Small bilateral pleural effusions. Dense consolidation in the lower lobes with air bronchograms, left greater than right.  IMPRESSION: 1. No convincing evidence of intra-abdominal abscess. Simple appearing ascitic fluid is present in the colectomy bed in both sides of the abdomen laterally. Ascites is also present in the upper abdomen in the perisplenic and perihepatic region. 2. High attenuation material within the gallbladder, likely sludge. No calcified gallstones. No evidence of acute cholecystitis or biliary ductal dilation. 3. Small bilateral pleural effusions and associated dense passive atelectasis or pneumonia in the visualized lower lobes, left greater than right.   Electronically Signed   By: Hulan Saas M.D.   On: 06/26/2015 17:35    Anti-infectives: Anti-infectives  Start     Dose/Rate Route Frequency Ordered Stop   06/20/15 2200  cefTAZidime (FORTAZ) 2 g in dextrose 5 % 50 mL IVPB     2 g 100 mL/hr over 30 Minutes Intravenous 3 times per day 06/20/15 1537     06/20/15 0200  vancomycin (VANCOCIN) IVPB  1000 mg/200 mL premix  Status:  Discontinued     1,000 mg 200 mL/hr over 60 Minutes Intravenous Every 12 hours 06/19/15 1512 06/20/15 0737   06/19/15 1200  vancomycin (VANCOCIN) 1,750 mg in sodium chloride 0.9 % 500 mL IVPB  Status:  Discontinued     1,750 mg 250 mL/hr over 120 Minutes Intravenous Every 24 hours 06/18/15 1030 06/19/15 1512   06/18/15 1200  fluconazole (DIFLUCAN) IVPB 400 mg  Status:  Discontinued     400 mg 100 mL/hr over 120 Minutes Intravenous Every 24 hours 06/18/15 1050 06/25/15 0925   06/18/15 1130  vancomycin (VANCOCIN) 2,500 mg in sodium chloride 0.9 % 500 mL IVPB     2,500 mg 250 mL/hr over 120 Minutes Intravenous  Once 06/18/15 1030 06/18/15 1314   06/18/15 1130  cefTAZidime (FORTAZ) 2 g in dextrose 5 % 50 mL IVPB  Status:  Discontinued     2 g 100 mL/hr over 30 Minutes Intravenous Every 12 hours 06/18/15 1030 06/20/15 1537   06/18/15 1030  metroNIDAZOLE (FLAGYL) IVPB 500 mg  Status:  Discontinued     500 mg 100 mL/hr over 60 Minutes Intravenous Every 8 hours 06/18/15 1000 06/25/15 0925      Assessment/Plan:  POD 9,7, s/p ex lap with subtotal colectomy, open abdomen, take back with creation of ileostomy and closure -tolerating full liquids -CT negative for abscess -VAC changes to midline wound, TRS  Done last night due to leak  -mobilize as able.  -pulm toilet -begin to wean TNA Leukocytosis -follow labs  DVT prophylaxis -SCDs/Heparin     LOS: 9 days    Catherin Doorn A. 06/27/2015

## 2015-06-27 NOTE — Progress Notes (Signed)
PARENTERAL NUTRITION CONSULT NOTE - FOLLOW UP  Pharmacy Consult for TPN Indication: Prolonged ileus  Allergies  Allergen Reactions  . Levaquin [Levofloxacin] Nausea Only  . Penicillins Hives  . Doxycycline Rash   Patient Measurements: Height: 5\' 9"  (175.3 cm) Weight: (!) 305 lb 1.9 oz (138.4 kg) IBW/kg (Calculated) : 70.7  Vital Signs: Temp: 97.7 F (36.5 C) (08/13 0741) Temp Source: Oral (08/13 0741) BP: 127/71 mmHg (08/13 0700) Pulse Rate: 83 (08/13 0700) Intake/Output from previous day: 08/12 0701 - 08/13 0700 In: 3845.7 [P.O.:840; I.V.:48.7; IV Piggyback:250; TPN:2707] Out: 5030 [Urine:3280; Drains:100; Stool:1650]  Labs:  Recent Labs  06/25/15 0430 06/26/15 0415 06/27/15 0535  WBC 19.8* 20.5*  --   HGB 12.6* 12.1*  --   HCT 38.4* 36.5*  --   PLT 167 179  --   APTT 32 30 30  INR 1.73* 1.46 1.40    Recent Labs  06/25/15 0430 06/26/15 0415 06/27/15 0535  NA 141 140 138  K 3.7 4.1 4.2  CL 106 105 107  CO2 28 30 25   GLUCOSE 131* 132* 125*  BUN 22* 21* 17  CREATININE 0.56* 0.54* 0.48*  CALCIUM 8.4* 8.4* 8.3*  MG 1.7 1.8  --   PHOS 3.1 3.3  --   PROT 4.0*  --   --   ALBUMIN 1.1*  --   --   AST 21  --   --   ALT 14*  --   --   ALKPHOS 75  --   --   BILITOT 1.0  --   --    Estimated Creatinine Clearance: 120.6 mL/min (by C-G formula based on Cr of 0.48).   Recent Labs  06/26/15 1927 06/26/15 2357 06/27/15 0406  GLUCAP 129* 120* 111*  Insulin Requirements in the past 24 hours:  10 units in TPN + 12 units SSI over 24 hours yesterday - CBG's controlled and all < 150  Current Nutrition:  Clinimix E5/20 at 60 ml/hr - weaning started yesterday - he had about 50% of meal yesterday (Dysphasia 3 - pudding thick).  Appetite is fair.  Assessment:  69 y/o morbidly obese M recently under went CABG 05/22/2015 with post-op R leg cellulitis, afib. He came to ED 8/4 after losing consciousness on toilet. Pt was hypotensive. K=2.5.  Surgeries/Procedures:   8/4: Total abdominal colectomy 8/6: return to OR for ileostomy and abdominal closure  GI: Morbid obesity, LBM 06/23/15 - Open abdomen, (+)VAC.NGT out with no nausea.  PPI IV. Diet was advanced to full liquids yesterday. Obtaining CT abdomen to rule out infectious source.  Significant amount of stool yesterday Drains:  300 ml/24 hr Endo: No hx DM - CBGs at goal Renal:AKI- resolved.Cr < 1. UOP 1 ml/kg/hr - electrolytes are WNL, corrected calcium ~ 11.0 Pulm: OSA, Self extubated 8/8. [place]-4L > BiPAP > 6L [place] Cards: HTN, 8/4 EF 35-40%, ICM.Septic shock 2nd ischemic colon Vitals stable - Levo still at 55ml/hr (hopefully, titrate off today) Hepatobil: LFTs WNL, INR 1.73 (no coumadin), TG 53.  Prealbumin 2.8- decreased Neuro: A&O, GCS 15, RASS 0 [id number] shock secondary to ischemic colon on d#9 ceftaz- afebrile, WBC 20.5 (8/12), PCT 15.4 (8/6), 8/4 blood cx are negative.   8/4 fortaz>> 8/4 vanco>> 8/6 8/4 flagyl>>8/11 8/4 fluconazole >>8/11  Best Practices: MC, SQ heparin (CBC WNL), IV PPI TPN Access: 8/4 Triple lumen CVC TPN start date: 8/5 >>  Nutritional Goals: per RD 8/9 2300-2500 kCal/day 145-155 grams protein/day   Plan:  - Wean TPN --  reduce to clinimix E5/20 to 1/2 rate again and would discontinue tomorrow.  Will infuse this bag at 74ml/hr - Insulin regular 10 units in TPN  - Daily MVI and trace elements in TPN - Will continue holding IVFE, originally holding for first 7 days in ICU, but now weaning TPN  - Replace magnesium 1 gm IV x 1 - F/u duration of ceftaz - F/u diet advancement   Nadara Mustard, PharmD., MS Clinical Pharmacist Pager:  [phone] Thank you for allowing pharmacy to be part of this patients care team. 06/27/2015 8:34 AM

## 2015-06-27 NOTE — Progress Notes (Signed)
Orthopedic Tech Progress Note Patient Details:  Jonathan Le 1946-02-24 [id number]  Ortho Devices Type of Ortho Device: Abdominal binder Ortho Device/Splint Interventions: Application   [name] 06/27/2015, 11:44 AM

## 2015-06-27 NOTE — Progress Notes (Signed)
PULMONARY / CRITICAL CARE MEDICINE   Name: Jonathan Le MRN: [id number] DOB: 02-15-1946    ADMISSION DATE:  06/18/2015 CONSULTATION DATE: 06/18/15   CHIEF COMPLAINT:  Septic shock secondary to ischemic colon. Acute respiratiory failure. Acute renal failure.  INITIAL PRESENTATION: 69 year old male with PMH of OSA and HTN. 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 Post discharge course complicated by R leg cellulitis after endoscopic vein harvest he was seen by Dr. Donata Clay in clinic for this,was having some constipation. 8/4 PM he took an enema and was straining in bathroom. He became diaphoretic and had loss of consciousness. In ED he was noted to be profoundly hypotensive with SOB. Found to be in severe septic shock with ischemic colon s/p total abd colectomy on 8/4  STUDIES:  Echo (8/4) estimated ejection fraction was in the range of 25% to 30%. Severe hypokinesis of the anteroseptal and anterior myocardium.Dyskinesis of the apical myocardium. .  CT chest abd pelvis (8/4) CTA CHEST: Limited CTA of the chest, poor bolus timing. Moderate to severe cardiomegaly with small pericardial effusion.Evidence of pulmonary edema with small pleural effusions and, patchy airspace opacities most consistent with confluent edema.  CT ABDOMEN AND PELVIS: Focal colonic hepatic flexure pneumatosis without bowel perforation. Moderate to large amount of distal retained large bowel stool. Mild probable ileus, no bowel obstruction.  Bilateral nonobstructing nephrolithiasis measuring up to 4 mm.  CT abdomen 8/12 >>  SIGNIFICANT EVENTS: 8/4- Total abdominal colectomy with abdominal vac placement 8/6 Exploratory laparotomy, removal of prior packs, closure of open abdomen, ileostomy 8/7 high pressors 8/8 self  extubated 8/9  resp distress , required bipap , lasix   SUBJECTIVE:   Afebrile overnight, no respiratory distress.  Remains on levophed.  VITAL SIGNS: Temp:  [97.7 F (36.5 C)-98.8 F (37.1 C)] 97.7 F (36.5 C) (08/13 0741) Pulse Rate:  [71-106] 83 (08/13 0700) Resp:  [12-45] 40 (08/13 0700) BP: (78-146)/(45-111) 127/71 mmHg (08/13 0700) SpO2:  [89 %-100 %] 97 % (08/13 0700) Weight:  [138.4 kg (305 lb 1.9 oz)] 138.4 kg (305 lb 1.9 oz) (08/13 0600)   HEMODYNAMICS: CVP:  [4 mmHg] 4 mmHg  VENTILATOR SETTINGS:    INTAKE / OUTPUT:  Intake/Output Summary (Last 24 hours) at 06/27/15 0958 Last data filed at 06/27/15 0700  Gross per 24 hour  Intake 3281.9 ml  Output   4650 ml  Net -1368.1 ml   PHYSICAL EXAMINATION: General: no resp distress,  anxious Neuro:awake, interactive, non focal Head: [place]/AT EENT: PERRL, no JVD noted Cardiovascular: Regular Rate and rhythm. No MRG Lungs: Decreased breath sounds antr Abdomen: Soft, non-tender, surgical dressing in place Ext: 2+ edema  LABS:  CBC  Recent Labs Lab 06/25/15 0430 06/26/15 0415 06/27/15 0535  WBC 19.8* 20.5* PENDING  HGB 12.6* 12.1* 12.5*  HCT 38.4* 36.5* 37.7*  PLT 167 179 PENDING   Coag's  Recent Labs Lab 06/25/15 0430 06/26/15 0415 06/27/15 0535  APTT 32 30 30  INR 1.73* 1.46 1.40   BMET  Recent Labs Lab 06/25/15 0430 06/26/15 0415 06/27/15 0535  NA 141 140 138  K 3.7 4.1 4.2  CL 106 105 107  CO2 BUN 22* 21* 17  CREATININE 0.56* 0.54* 0.48*  GLUCOSE 131* 132* 125*   Electrolytes  Recent Labs Lab 06/24/15  0430 06/25/15 0430 06/26/15 0415 06/27/15 0535  CALCIUM 8.3* 8.4* 8.4* 8.3*  MG 1.7 1.7 1.8  --   PHOS 2.9 3.1 3.3  --    Sepsis Markers No results for input(s): LATICACIDVEN, PROCALCITON, O2SATVEN in the last 168 hours. ABG  Recent Labs Lab 06/22/15 1632 06/23/15 1616  PHART 7.349* 7.418  PCO2ART 41.7 34.7*  PO2ART 70.0* 57.0*   Liver Enzymes  Recent  Labs Lab 06/21/15 0350 06/22/15 0405 06/25/15 0430  AST 43* 22 21  ALT 56 36 14*  ALKPHOS 101 80 75  BILITOT 1.0 0.9 1.0  ALBUMIN 1.7* 1.5* 1.1*   Cardiac Enzymes No results for input(s): TROPONINI, PROBNP in the last 168 hours. Glucose  Recent Labs Lab 06/26/15 0812 06/26/15 1154 06/26/15 1627 06/26/15 1927 06/26/15 2357 06/27/15 0406  GLUCAP 121* 137* 141* 129* 120* 111*   Imaging Ct Abdomen Pelvis W Contrast  06/26/2015   CLINICAL DATA:  Postop total colectomy for ischemic colitis on 06/18/2015 with abdominal wound closure and ileostomy on 06/20/2015. Patient continues to have leukocytosis with blood cell white count of 220 20,000.  EXAM: CT ABDOMEN AND PELVIS WITH CONTRAST  TECHNIQUE: Multidetector CT imaging of the abdomen and pelvis was performed using the standard protocol following bolus administration of intravenous contrast.  CONTRAST:  OMNIPAQUE IOHEXOL 300 MG/ML IV. Oral contrast was also administered.  COMPARISON:  06/18/2015.  FINDINGS: Interval subtotal colectomy, with a residual Hartmann rectal pouch. Ileostomy in the right upper pelvis in the periumbilical region. No evidence of small bowel obstruction, as the oral contrast progresses throughout the small bowel into the ostomy. Moderately large amount of simple ascites, though the ascites may be loculated as it is confined to the perihepatic and perisplenic regions of the upper abdomen and the lateral portion of both sides of the abdomen in the colectomy spread. No evidence of free intraperitoneal air.  No change in the appearance of the liver, spleen, pancreas, adrenal glands and kidneys. Nonobstructing right upper pole and left lower pole renal calculi again noted. High attenuation material within the gallbladder without evidence of significant gallbladder distention. No biliary ductal dilation.  Urinary bladder decompressed by Foley catheter. Marked prostate gland enlargement as noted previously. Left inguinal  hernia containing fat.  Edema in the subcutaneous fat of the anterior abdominal wall. Small bilateral pleural effusions. Dense consolidation in the lower lobes with air bronchograms, left greater than right.  IMPRESSION: 1. No convincing evidence of intra-abdominal abscess. Simple appearing ascitic fluid is present in the colectomy bed in both sides of the abdomen laterally. Ascites is also present in the upper abdomen in the perisplenic and perihepatic region. 2. High attenuation material within the gallbladder, likely sludge. No calcified gallstones. No evidence of acute cholecystitis or biliary ductal dilation. 3. Small bilateral pleural effusions and associated dense passive atelectasis or pneumonia in the visualized lower lobes, left greater than right.   Electronically Signed   By: Hulan Saas M.D.   On: 06/26/2015 17:35   ASSESSMENT / PLAN:  PULMONARY A: Acute resp failure, post -op OSA ETT 8/4 >>8/8 Episode of resp distress 8/9 - ? Fluid overload vs aspiration  P:   IS per RT protocol. D/C BiPAP. Nocturnal CPAP.  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 Equal to neg balance.  RENAL A:  AKI -resolved Hypophos P:   D/C IVF.  Replete lytes as needed. Lasix 20  q 8h x2 doses. BMET in AM.  GASTROINTESTINAL A:  s/p total colectomy for ischemic bowel S/p  ileostomy  P:   Continue TPN, advance PO per speech pathology. Protonix for SUP.  HEMATOLOGIC A: Leucocytosis P:  Transfusion only if HB <7.  INFECTIOUS A:  Septic shock from ischemic bowel. All cultures are negative to date.  Leucocytosis -trending down P:   Abx: Ceftaz 8/4>> Vanco 8/5>> 8/6 Flagyl 8/4>> 8/11 Diflucan 8/4>>8/11  Ct Abx x 14 ds, rpt CT with no abscess. Right foot ulcer - wound care to evaluate.  ENDOCRINE A:  Hyperglycemia without h/o DM P:  SSI   NEUROLOGIC A: Pain, Anxiety   deconditioning P:   Fent int, has not needed any. PT following. PRN ativan, not needed at this time.  FAMILY  - Updates: no family bedside. - Inter-disciplinary family meet or Palliative Care meeting due by: NA  The patient is critically ill with multiple organ systems failure and requires high complexity decision making for assessment and support, frequent evaluation and titration of therapies, application of advanced monitoring technologies and extensive interpretation of multiple databases.   Critical Care Time devoted to patient care services described in this note is  35  Minutes. This time reflects time of care of this signee Dr [name]. This critical care time does not reflect procedure time, or teaching time or supervisory time of PA/NP/Med student/Med Resident etc but could involve care discussion time.  Alyson Reedy, M.D. [facility] Pulmonary/Critical Care Medicine. Pager: [phone]. After hours pager: [phone].   06/27/2015, 9:58 AM

## 2015-06-28 LAB — GLUCOSE, CAPILLARY
GLUCOSE-CAPILLARY: 102 mg/dL — AB (ref 65–99)
GLUCOSE-CAPILLARY: 139 mg/dL — AB (ref 65–99)
GLUCOSE-CAPILLARY: 124 mg/dL — AB (ref 65–99)
Glucose-Capillary: 134 mg/dL — ABNORMAL HIGH (ref 65–99)

## 2015-06-28 LAB — CBC
HEMATOCRIT: 37.6 % — AB (ref 39.0–52.0)
HEMOGLOBIN: 12.2 g/dL — AB (ref 13.0–17.0)
MCH: 28.8 pg (ref 26.0–34.0)
MCHC: 32.4 g/dL (ref 30.0–36.0)
MCV: 88.9 fL (ref 78.0–100.0)
PLATELETS: 241 10*3/uL (ref 150–400)
RBC: 4.23 MIL/uL (ref 4.22–5.81)
RDW: 16 % — ABNORMAL HIGH (ref 11.5–15.5)
WBC: 19.3 10*3/uL — AB (ref 4.0–10.5)

## 2015-06-28 LAB — BASIC METABOLIC PANEL
ANION GAP: 7 (ref 5–15)
BUN: 17 mg/dL (ref 6–20)
CHLORIDE: 104 mmol/L (ref 101–111)
CO2: 29 mmol/L (ref 22–32)
Calcium: 8.3 mg/dL — ABNORMAL LOW (ref 8.9–10.3)
Creatinine, Ser: 0.51 mg/dL — ABNORMAL LOW (ref 0.61–1.24)
GFR calc non Af Amer: >60 mL/min (ref >60)
Glucose, Bld: 121 mg/dL — ABNORMAL HIGH (ref 65–99)
POTASSIUM: 3.9 mmol/L (ref 3.5–5.1)
Sodium: 140 mmol/L (ref 135–145)

## 2015-06-28 LAB — PHOSPHORUS: Phosphorus: 3.1 mg/dL (ref 2.5–4.6)

## 2015-06-28 LAB — MAGNESIUM: MAGNESIUM: 1.8 mg/dL (ref 1.7–2.4)

## 2015-06-28 LAB — PROTIME-INR
INR: 1.35 (ref 0.00–1.49)
Prothrombin Time: 16.8 seconds — ABNORMAL HIGH (ref 11.6–15.2)

## 2015-06-28 LAB — BLOOD GAS, ARTERIAL
Acid-Base Excess: 4.4 mmol/L — ABNORMAL HIGH (ref 0.0–2.0)
BICARBONATE: 28.5 meq/L — AB (ref 20.0–24.0)
Drawn by: 105521
O2 CONTENT: 4 L/min
O2 Saturation: 94.3 %
PH ART: 7.437 (ref 7.350–7.450)
Patient temperature: 98.6
TCO2: 29.8 mmol/L (ref 0–100)
pCO2 arterial: 42.9 mmHg (ref 35.0–45.0)
pO2, Arterial: 76.8 mmHg — ABNORMAL LOW (ref 80.0–100.0)

## 2015-06-28 LAB — APTT: aPTT: 31 seconds (ref 24–37)

## 2015-06-28 MED ORDER — ALPRAZOLAM 0.5 MG PO TABS
1.0000 mg | ORAL_TABLET | Freq: Two times a day (BID) | ORAL | Status: DC
  Administered 2015-06-28 – 2015-06-29 (×3): 1 mg via ORAL
  Filled 2015-06-28 (×4): qty 2

## 2015-06-28 MED ORDER — MAGNESIUM SULFATE 2 GM/50ML IV SOLN
2.0000 g | Freq: Once | INTRAVENOUS | Status: AC
  Administered 2015-06-28: 2 g via INTRAVENOUS
  Filled 2015-06-28: qty 50

## 2015-06-28 MED ORDER — TRACE MINERALS CR-CU-MN-SE-ZN 10-1000-500-60 MCG/ML IV SOLN
INTRAVENOUS | Status: AC
  Administered 2015-06-28: 17:00:00 via INTRAVENOUS
  Filled 2015-06-28: qty 720

## 2015-06-28 MED ORDER — FUROSEMIDE 10 MG/ML IJ SOLN
20.0000 mg | Freq: Three times a day (TID) | INTRAMUSCULAR | Status: AC
  Administered 2015-06-28 (×2): 20 mg via INTRAVENOUS
  Filled 2015-06-28 (×2): qty 2

## 2015-06-28 NOTE — Progress Notes (Signed)
PARENTERAL NUTRITION CONSULT NOTE - FOLLOW UP  Pharmacy Consult for TPN Indication: Prolonged ileus  Allergies  Allergen Reactions  . Levaquin [Levofloxacin] Nausea Only  . Penicillins Hives  . Doxycycline Rash   Patient Measurements: Height: 5\' 9"  (175.3 cm) Weight: (!) 303 lb 5.7 oz (137.6 kg) IBW/kg (Calculated) : 70.7  Vital Signs: Temp: 97.8 F (36.6 C) (08/14 0852) Temp Source: Oral (08/14 0852) BP: 96/51 mmHg (08/14 0800) Pulse Rate: 86 (08/14 0900) Intake/Output from previous day: 08/13 0701 - 08/14 0700 In: 2147.3 [P.O.:720; I.V.:287.3; IV Piggyback:150; TPN:990] Out: 4835 [Urine:2985; Drains:175; Stool:1675]  Labs:  Recent Labs  06/26/15 0415 06/27/15 0535 06/28/15 0520  WBC 20.5* 24.1* 19.3*  HGB 12.1* 12.5* 12.2*  HCT 36.5* 37.7* 37.6*  PLT 179 195 241  APTT 30 30 31   INR 1.46 1.40 1.35    Recent Labs  06/26/15 0415 06/27/15 0535 06/28/15 0520  NA 140 138 140  K 4.1 4.2 3.9  CL 105 107 104  CO2 30 25 29   GLUCOSE 132* 125* 121*  BUN 21* 17 17  CREATININE 0.54* 0.48* 0.51*  CALCIUM 8.4* 8.3* 8.3*  MG 1.8  --  1.8  PHOS 3.3  --  3.1   Estimated Creatinine Clearance: 120.2 mL/min (by C-G formula based on Cr of 0.51).   Recent Labs  06/27/15 1934 06/27/15 2257 06/28/15 0851  GLUCAP 119* 110* 102*  Insulin Requirements in the past 24 hours:  10 units in TPN + 6 units SSI over 24 hours yesterday - CBG's controlled   Current Nutrition:  Clinimix E5/20 at 30 ml/hr - weaning started yesterday - he's on Dysphasia 3 - pudding thick.  Appetite is poor/fair. MD wants to continue TPN for one more day, until intake is better  Assessment:  69 y/o morbidly obese M recently under went CABG 05/22/2015 with post-op R leg cellulitis, afib. He came to ED 8/4 after losing consciousness on toilet. Pt was hypotensive.   Surgeries/Procedures:  8/4: Total abdominal colectomy 8/6: return to OR for ileostomy and abdominal closure  GI: Morbid obesity, LBM  06/23/15 - Open abdomen, (+)VAC.NGT out with no nausea.  PPI IV. Diet was advanced to dysphagia 3. Significant amount of stool yesterday Drains:  175 ml/24 hr Endo: No hx DM - CBGs at goal Renal:AKI- resolved.Cr < 1. UOP 1 ml/kg/hr - electrolytes are WNL, corrected calcium ~ 11.0 Pulm: OSA, Self extubated 8/8. [place]-4L > BiPAP > 6L [place] Cards: HTN, 8/4 EF 35-40%, ICM.Septic shock 2nd ischemic colon Vitals stable - Levophed at 6  Hepatobil: LFTs WNL, INR 1.73 (no coumadin), TG 53.  Prealbumin 2.8- decreased Neuro: A&O RU:[id number] shock secondary to ischemic colon on d#11 ceftaz- afebrile, WBC 20.5 (8/12), PCT 15.4 (8/6), 8/4 blood cx are negative.   8/4 fortaz>> 8/4 vanco>> 8/6 8/4 flagyl>>8/11 8/4 fluconazole >>8/11  Best Practices: MC, SQ heparin (CBC WNL), IV PPI TPN Access: 8/4 Triple lumen CVC TPN start date: 8/5 >>  Nutritional Goals: per RD 8/9 2300-2500 kCal/day 145-155 grams protein/day   Plan:  - Weaning TPN -- continue clinimix E5/20 at 1/2 rate again, continue at 22ml/hr for one more day - Insulin regular 10 units in TPN, can probably hold when pt stops tpn - Daily MVI and trace elements in TPN - Will continue holding IVFE, originally holding for first 7 days in ICU, but now weaning TPN  - F/u duration of ceftaz - Monitor PO intake      Agapito Games, PharmD, BCPS Clinical Pharmacist Pager:  [phone] 06/28/2015 11:13 AM

## 2015-06-28 NOTE — Progress Notes (Signed)
PULMONARY / CRITICAL CARE MEDICINE   Name: Jonathan Le MRN: [id number] DOB: [birth year]    ADMISSION DATE:  06/18/2015 CONSULTATION DATE: 06/18/15   CHIEF COMPLAINT:  Septic shock secondary to ischemic colon. Acute respiratiory failure. Acute renal failure.  INITIAL PRESENTATION: 69 year old male with PMH of OSA and HTN. 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 Post discharge course complicated by R leg cellulitis after endoscopic vein harvest he was seen by Dr. Donata Clay in clinic for this,was having some constipation. 8/4 PM he took an enema and was straining in bathroom. He became diaphoretic and had loss of consciousness. In ED he was noted to be profoundly hypotensive with SOB. Found to be in severe septic shock with ischemic colon s/p total abd colectomy on 8/4  STUDIES:  Echo (8/4) estimated ejection fraction was in the range of 25% to 30%. Severe hypokinesis of the anteroseptal and anterior myocardium.Dyskinesis of the apical myocardium. .  CT chest abd pelvis (8/4) CTA CHEST: Limited CTA of the chest, poor bolus timing. Moderate to severe cardiomegaly with small pericardial effusion.Evidence of pulmonary edema with small pleural effusions and, patchy airspace opacities most consistent with confluent edema.  CT ABDOMEN AND PELVIS: Focal colonic hepatic flexure pneumatosis without bowel perforation. Moderate to large amount of distal retained large bowel stool. Mild probable ileus, no bowel obstruction.  Bilateral nonobstructing nephrolithiasis measuring up to 4 mm.  CT abdomen 8/12 >>  SIGNIFICANT EVENTS: 8/4- Total abdominal colectomy with abdominal vac placement 8/6 Exploratory laparotomy, removal of prior packs, closure of open abdomen, ileostomy 8/7 high pressors 8/8 self  extubated 8/9  resp distress , required bipap , lasix 8/13 had to restart levophed for shock overnight.  SUBJECTIVE:   Confused this AM and agitated, had to increase pressors overnight.  VITAL SIGNS: Temp:  [97.4 F (36.3 C)-98.3 F (36.8 C)] 97.8 F (36.6 C) (08/14 0852) Pulse Rate:  [69-90] 86 (08/14 0900) Resp:  [17-41] 20 (08/14 0900) BP: (81-137)/(42-77) 96/51 mmHg (08/14 0800) SpO2:  [90 %-100 %] 97 % (08/14 0900) Weight:  [137.6 kg (303 lb 5.7 oz)] 137.6 kg (303 lb 5.7 oz) (08/14 0430)   HEMODYNAMICS: CVP:  [6 mmHg-10 mmHg] 6 mmHg  VENTILATOR SETTINGS:    INTAKE / OUTPUT:  Intake/Output Summary (Last 24 hours) at 06/28/15 0947 Last data filed at 06/28/15 0900  Gross per 24 hour  Intake 2255.7 ml  Output   4375 ml  Net -2119.3 ml   PHYSICAL EXAMINATION: General: no resp distress,  anxious Neuro:awake, interactive, non focal Head: Comunas/AT EENT: PERRL, no JVD noted Cardiovascular: Regular Rate and rhythm. No MRG Lungs: Decreased breath sounds antr Abdomen: Soft, non-tender, surgical dressing in place Ext: 2+ edema  LABS:  CBC  Recent Labs Lab 06/26/15 0415 06/27/15 0535 06/28/15 0520  WBC 20.5* 24.1* 19.3*  HGB 12.1* 12.5* 12.2*  HCT 36.5* 37.7* 37.6*  PLT 179 195 241   Coag's  Recent Labs Lab 06/26/15 0415 06/27/15 0535 06/28/15 0520  APTT INR 1.46 1.40 1.35   BMET  Recent Labs Lab 06/26/15 0415 06/27/15 0535 06/28/15 0520  NA 140 138 140  K 4.1 4.2 3.9  CL 105 107 104  CO2 BUN 21* 17 17  CREATININE 0.54* 0.48* 0.51*  GLUCOSE 132* 125*  121*   Electrolytes  Recent Labs Lab 06/25/15 0430 06/26/15 0415 06/27/15 0535 06/28/15 0520  CALCIUM 8.4* 8.4* 8.3* 8.3*  MG 1.7 1.8  --  1.8  PHOS 3.1 3.3  --  3.1   Sepsis Markers No results for input(s): LATICACIDVEN, PROCALCITON, O2SATVEN in the last 168 hours. ABG  Recent Labs Lab 06/22/15 1632 06/23/15 1616  PHART 7.349* 7.418  PCO2ART 41.7 34.7*   PO2ART 70.0* 57.0*   Liver Enzymes  Recent Labs Lab 06/22/15 0405 06/25/15 0430  AST 22 21  ALT 36 14*  ALKPHOS 80 75  BILITOT 0.9 1.0  ALBUMIN 1.5* 1.1*   Cardiac Enzymes No results for input(s): TROPONINI, PROBNP in the last 168 hours. Glucose  Recent Labs Lab 06/27/15 0738 06/27/15 1219 06/27/15 1638 06/27/15 1934 06/27/15 2257 06/28/15 0851  GLUCAP 109* 118* 166* 119* 110* 102*   Imaging No results found. ASSESSMENT / PLAN:  PULMONARY A: Acute resp failure, post -op OSA ETT 8/4 >>8/8 Episode of resp distress 8/9 - ? Fluid overload vs aspiration  P:   IS per RT protocol. D/C BiPAP. Nocturnal CPAP.  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 Diureses as ordered.  RENAL A:  AKI -resolved Hypomag P:   D/C IVF.  Replete lytes as needed. Lasix 20 q 8h x2 doses. BMET in AM.  GASTROINTESTINAL A:  s/p total colectomy for ischemic bowel S/p  ileostomy  P:   Continue TPN, advance PO per speech pathology, once able to take full diet then will d/c TPN. Protonix for SUP.  HEMATOLOGIC A: Leucocytosis P:  Transfusion only if HB <7.  INFECTIOUS A:  Septic shock from ischemic bowel. All cultures are negative to date.  Leucocytosis -trending down P:   Abx: Ceftaz 8/4>> Vanco 8/5>> 8/6 Flagyl 8/4>> 8/11 Diflucan 8/4>>8/11  Ct Abx x 14 ds, rpt CT with no abscess. Right foot ulcer - wound care to evaluate.  ENDOCRINE A:  Hyperglycemia without h/o DM P:  SSI   NEUROLOGIC A: Pain, Anxiety  deconditioning P:   Fent intermittently, has not needed any. PT following. PRN ativan, not needed at this time. Restart home xanax (1 mg PO BID).  FAMILY  - Updates: no family bedside.  The patient is critically ill with multiple organ systems failure and requires high complexity decision making for assessment and support, frequent evaluation and  titration of therapies, application of advanced monitoring technologies and extensive interpretation of multiple databases.   Critical Care Time devoted to patient care services described in this note is  35  Minutes. This time reflects time of care of this signee Dr [name]. This critical care time does not reflect procedure time, or teaching time or supervisory time of PA/NP/Med student/Med Resident etc but could involve care discussion time.  Alyson Reedy, M.D. [facility] Pulmonary/Critical Care Medicine. Pager: [phone]. After hours pager: [phone].   06/28/2015, 9:47 AM

## 2015-06-28 NOTE — Progress Notes (Signed)
8 Days Post-Op  Subjective: Up in chair  Objective: Vital signs in last 24 hours: Temp:  [97.4 F (36.3 C)-98.3 F (36.8 C)] 97.8 F (36.6 C) (08/14 0852) Pulse Rate:  [69-90] 86 (08/14 0900) Resp:  [17-41] 20 (08/14 0900) BP: (81-137)/(42-77) 96/51 mmHg (08/14 0800) SpO2:  [90 %-100 %] 97 % (08/14 0900) Weight:  [137.6 kg (303 lb 5.7 oz)] 137.6 kg (303 lb 5.7 oz) (08/14 0430) Last BM Date: 06/23/15  Intake/Output from previous day: 08/13 0701 - 08/14 0700 In: 2147.3 [P.O.:720; I.V.:287.3; IV Piggyback:150; TPN:990] Out: 4835 [Urine:2985; Drains:175; Stool:1675] Intake/Output this shift:    General appearance: cooperative Back: correct - lungs CTA Cardio: regular rate and rhythm GI: soft, VAC on skin, stoma pink with liquid output  Lab Results:   Recent Labs  06/27/15 0535 06/28/15 0520  WBC 24.1* 19.3*  HGB 12.5* 12.2*  HCT 37.7* 37.6*  PLT 195 241   BMET  Recent Labs  06/27/15 0535 06/28/15 0520  NA 138 140  K 4.2 3.9  CL 107 104  CO2 25 29  GLUCOSE 125* 121*  BUN 17 17  CREATININE 0.48* 0.51*  CALCIUM 8.3* 8.3*   PT/INR  Recent Labs  06/27/15 0535 06/28/15 0520  LABPROT 17.2* 16.8*  INR 1.40 1.35   ABG No results for input(s): PHART, HCO3 in the last 72 hours.  Invalid input(s): PCO2, PO2  Studies/Results: Ct Abdomen Pelvis W Contrast  06/26/2015   CLINICAL DATA:  Postop total colectomy for ischemic colitis on 06/18/2015 with abdominal wound closure and ileostomy on 06/20/2015. Patient continues to have leukocytosis with blood cell white count of 220 20,000.  EXAM: CT ABDOMEN AND PELVIS WITH CONTRAST  TECHNIQUE: Multidetector CT imaging of the abdomen and pelvis was performed using the standard protocol following bolus administration of intravenous contrast.  CONTRAST:  OMNIPAQUE IOHEXOL 300 MG/ML IV. Oral contrast was also administered.  COMPARISON:  06/18/2015.  FINDINGS: Interval subtotal colectomy, with a residual Hartmann rectal  pouch. Ileostomy in the right upper pelvis in the periumbilical region. No evidence of small bowel obstruction, as the oral contrast progresses throughout the small bowel into the ostomy. Moderately large amount of simple ascites, though the ascites may be loculated as it is confined to the perihepatic and perisplenic regions of the upper abdomen and the lateral portion of both sides of the abdomen in the colectomy spread. No evidence of free intraperitoneal air.  No change in the appearance of the liver, spleen, pancreas, adrenal glands and kidneys. Nonobstructing right upper pole and left lower pole renal calculi again noted. High attenuation material within the gallbladder without evidence of significant gallbladder distention. No biliary ductal dilation.  Urinary bladder decompressed by Foley catheter. Marked prostate gland enlargement as noted previously. Left inguinal hernia containing fat.  Edema in the subcutaneous fat of the anterior abdominal wall. Small bilateral pleural effusions. Dense consolidation in the lower lobes with air bronchograms, left greater than right.  IMPRESSION: 1. No convincing evidence of intra-abdominal abscess. Simple appearing ascitic fluid is present in the colectomy bed in both sides of the abdomen laterally. Ascites is also present in the upper abdomen in the perisplenic and perihepatic region. 2. High attenuation material within the gallbladder, likely sludge. No calcified gallstones. No evidence of acute cholecystitis or biliary ductal dilation. 3. Small bilateral pleural effusions and associated dense passive atelectasis or pneumonia in the visualized lower lobes, left greater than right.   Electronically Signed   By: Kayren Eaves.D.  On: 06/26/2015 17:35    Anti-infectives: Anti-infectives    Start     Dose/Rate Route Frequency Ordered Stop   06/20/15 2200  cefTAZidime (FORTAZ) 2 g in dextrose 5 % 50 mL IVPB     2 g 100 mL/hr over 30 Minutes Intravenous 3 times  per day 06/20/15 1537     06/20/15 0200  vancomycin (VANCOCIN) IVPB 1000 mg/200 mL premix  Status:  Discontinued     1,000 mg 200 mL/hr over 60 Minutes Intravenous Every 12 hours 06/19/15 1512 06/20/15 0737   06/19/15 1200  vancomycin (VANCOCIN) 1,750 mg in sodium chloride 0.9 % 500 mL IVPB  Status:  Discontinued     1,750 mg 250 mL/hr over 120 Minutes Intravenous Every 24 hours 06/18/15 1030 06/19/15 1512   06/18/15 1200  fluconazole (DIFLUCAN) IVPB 400 mg  Status:  Discontinued     400 mg 100 mL/hr over 120 Minutes Intravenous Every 24 hours 06/18/15 1050 06/25/15 0925   06/18/15 1130  vancomycin (VANCOCIN) 2,500 mg in sodium chloride 0.9 % 500 mL IVPB     2,500 mg 250 mL/hr over 120 Minutes Intravenous  Once 06/18/15 1030 06/18/15 1314   06/18/15 1130  cefTAZidime (FORTAZ) 2 g in dextrose 5 % 50 mL IVPB  Status:  Discontinued     2 g 100 mL/hr over 30 Minutes Intravenous Every 12 hours 06/18/15 1030 06/20/15 1537   06/18/15 1030  metroNIDAZOLE (FLAGYL) IVPB 500 mg  Status:  Discontinued     500 mg 100 mL/hr over 60 Minutes Intravenous Every 8 hours 06/18/15 1000 06/25/15 0925      Assessment/Plan: s/p Procedure(s): WASHOUT OF ABDOMEN, CLOSURE OF ABDOMINAL WALL WITH PLACEMENT OF WOUND VAC AND ILLEOSTOMY  (N/A)  POD 10,8 s/p ex lap with subtotal colectomy, open abdomen, take back with creation of ileostomy and closure D3 diet Some confusion Resume home Xanax dose Wean Levophed WBC down a bit, CT last week showed no abscess  LOS: 10 days    Jonathan Le 06/28/2015

## 2015-06-28 NOTE — Progress Notes (Signed)
ANTIBIOTIC CONSULT NOTE - F/U  Pharmacy Consult for fortaz, fluconazole Indication: r/o sepsis  Allergies  Allergen Reactions  . Levaquin [Levofloxacin] Nausea Only  . Penicillins Hives  . Doxycycline Rash    Patient Measurements: Height:  (175.3 cm) Weight: (!) 303 lb 5.7 oz (137.6 kg) IBW/kg (Calculated) : 70.7  Vital Signs: Temp: 97.8 F (36.6 C) (08/14 0852) Temp Source: Oral (08/14 0852) BP: 96/51 mmHg (08/14 0800) Pulse Rate: 86 (08/14 0900) Intake/Output from previous day: 08/13 0701 - 08/14 0700 In: 2147.3 [P.O.:720; I.V.:287.3; IV Piggyback:150; TPN:990] Out: 4835 [Urine:2985; Drains:175; Stool:1675] Intake/Output from this shift: Total I/O In: 471.2 [P.O.:360; I.V.:51.2; TPN:60] Out: -   Labs:  Recent Labs  06/26/15 0415 06/27/15 0535 06/28/15 0520  WBC 20.5* 24.1* 19.3*  HGB 12.1* 12.5* 12.2*  PLT 179 195 241  CREATININE 0.54* 0.48* 0.51*   Estimated Creatinine Clearance: 120.2 mL/min (by C-G formula based on Cr of 0.51). No results for input(s): VANCOTROUGH, VANCOPEAK, VANCORANDOM, GENTTROUGH, GENTPEAK, GENTRANDOM, TOBRATROUGH, TOBRAPEAK, TOBRARND, AMIKACINPEAK, AMIKACINTROU, AMIKACIN in the last 72 hours.   Microbiology: Recent Results (from the past 720 hour(s))  Culture, blood (routine x 2)     Status: None   Collection Time: 06/18/15  6:53 AM  Result Value Ref Range Status   Specimen Description BLOOD RIGHT HAND  Final   Special Requests BOTTLES DRAWN AEROBIC AND ANAEROBIC 5CC  Final   Culture NO GROWTH 5 DAYS  Final   Report Status 06/23/2015 FINAL  Final  Culture, blood (routine x 2)     Status: None   Collection Time: 06/18/15  6:59 AM  Result Value Ref Range Status   Specimen Description BLOOD LEFT HAND  Final   Special Requests BOTTLES DRAWN AEROBIC ONLY 5CC  Final   Culture NO GROWTH 5 DAYS  Final   Report Status 06/23/2015 FINAL  Final  C difficile quick scan w PCR reflex     Status: None   Collection Time: 06/18/15 11:12 AM   Result Value Ref Range Status   C Diff antigen NEGATIVE NEGATIVE Final   C Diff toxin NEGATIVE NEGATIVE Final   C Diff interpretation Negative for toxigenic C. difficile  Final    Medical History: Past Medical History  Diagnosis Date  . Anxiety   . Hypertension   . Arthritis   . Sleep apnea     wears CPAP nightly  . Glaucoma   . CHF (congestive heart failure)     MI, with open heart surgery x 30 sdays ago per pt. (06/26/15)    Medications:  Prescriptions prior to admission  Medication Sig Dispense Refill Last Dose  . acetaminophen (TYLENOL) 500 MG tablet Take 1,500 mg by mouth every 6 (six) hours as needed for headache.   06/17/2015 at Unknown time  . ALPRAZolam (XANAX) 1 MG tablet Take 1 tablet (1 mg total) by mouth 2 (two) times daily. 10 tablet 0 06/17/2015 at Unknown time  . amiodarone (PACERONE) 200 MG tablet Take 1 tablet (200 mg total) by mouth daily. 30 tablet 1 06/17/2015 at Unknown time  . aspirin EC 325 MG EC tablet Take 1 tablet (325 mg total) by mouth daily. 30 tablet 0 06/17/2015 at Unknown time  . atorvastatin (LIPITOR) 80 MG tablet Take 1 tablet (80 mg total) by mouth daily at 6 PM. 30 tablet 3 06/17/2015 at Unknown time  . clindamycin (CLEOCIN) 300 MG capsule Take 1 capsule (300 mg total) by mouth 3 (three) times daily. 30 capsule 0 06/17/2015  at Unknown time  . finasteride (PROSCAR) 5 MG tablet Take 5 mg by mouth daily.   06/17/2015 at Unknown time  . furosemide (LASIX) 40 MG tablet TAKE 1 TABLET BY MOUTH DAILY 90 tablet 0 06/17/2015 at Unknown time  . lactulose, encephalopathy, (CHRONULAC) 10 GM/15ML SOLN Take 20 g by mouth daily as needed (for constipation).   06/17/2015 at Unknown time  . lisinopril (PRINIVIL,ZESTRIL) 5 MG tablet Take 1 tablet (5 mg total) by mouth daily. 30 tablet 3 06/17/2015 at Unknown time  . metolazone (ZAROXOLYN) 5 MG tablet TAKE 1 TABLET BY MOUTH EVERY DAY 90 tablet 0 06/17/2015 at Unknown time  . potassium chloride SA (K-DUR,KLOR-CON) 20 MEQ tablet TAKE 1  TABLET BY MOUTH DAILY 90 tablet 0 06/17/2015 at Unknown time  . sodium phosphate (FLEET) enema Place 1 enema rectally once. follow package directions   06/17/2015 at Unknown time   Scheduled:  . ALPRAZolam  1 mg Oral BID  . antiseptic oral rinse  7 mL Mouth Rinse QID  . cefTAZidime (FORTAZ)  IV  2 g Intravenous 3 times per day  . chlorhexidine gluconate  15 mL Mouth Rinse BID  . enoxaparin (LOVENOX) injection  30 mg Subcutaneous Q24H  . furosemide  20 mg Intravenous Q8H  . insulin aspart  2-6 Units Subcutaneous 6 times per day  . magnesium sulfate 1 - 4 g bolus IVPB  2 g Intravenous Once  . pantoprazole (PROTONIX) IV  40 mg Intravenous QHS  . sodium chloride  500 mL Intravenous Once  . sodium chloride  10-40 mL Intracatheter Q12H    Assessment: 69 yo male with recent CABG(7/8) and cellulitis readmitted 8/4 with septic shock (per MD- likely from an intra-abdominal source due to either ischemic or infectious colitis).    Infectious Disease: septic shock from ischemic bowel.  WBC= 19.3  afebrile, Noted on clindamycin PTA Plan to re-ct abdomen to r/o ongoing infxn Looks like plan is for 14 total days of abx  8/4 fortaz>> 8/4 vanco>> 8/6 8/4 flagyl>>8/11 8/4 fluconazole >>8/11  8/4 blood x2>>ngtd 8/5 c-diff neg  Plan:  -Continue Fortaz 2gm IV q8h  -F/U clinical status, LOT  Isaac Bliss, PharmD, BCPS Clinical Pharmacist Pager [phone] 06/28/2015 10:23 AM

## 2015-06-29 LAB — PROTIME-INR
INR: 1.31 (ref 0.00–1.49)
PROTHROMBIN TIME: 16.4 s — AB (ref 11.6–15.2)

## 2015-06-29 LAB — BASIC METABOLIC PANEL
Anion gap: 7 (ref 5–15)
BUN: 14 mg/dL (ref 6–20)
CHLORIDE: 102 mmol/L (ref 101–111)
CO2: 30 mmol/L (ref 22–32)
CREATININE: 0.53 mg/dL — AB (ref 0.61–1.24)
Calcium: 8.3 mg/dL — ABNORMAL LOW (ref 8.9–10.3)
GFR calc Af Amer: >60 mL/min (ref >60)
GFR calc non Af Amer: >60 mL/min (ref >60)
Glucose, Bld: 126 mg/dL — ABNORMAL HIGH (ref 65–99)
POTASSIUM: 3.7 mmol/L (ref 3.5–5.1)
SODIUM: 139 mmol/L (ref 135–145)

## 2015-06-29 LAB — APTT: aPTT: 30 seconds (ref 24–37)

## 2015-06-29 LAB — COMPREHENSIVE METABOLIC PANEL
ALBUMIN: 1.3 g/dL — AB (ref 3.5–5.0)
ALT: 16 U/L — AB (ref 17–63)
AST: 29 U/L (ref 15–41)
Alkaline Phosphatase: 85 U/L (ref 38–126)
Anion gap: 5 (ref 5–15)
BILIRUBIN TOTAL: 1.2 mg/dL (ref 0.3–1.2)
BUN: 14 mg/dL (ref 6–20)
CHLORIDE: 104 mmol/L (ref 101–111)
CO2: 32 mmol/L (ref 22–32)
CREATININE: 0.55 mg/dL — AB (ref 0.61–1.24)
Calcium: 8.4 mg/dL — ABNORMAL LOW (ref 8.9–10.3)
GFR calc Af Amer: >60 mL/min (ref >60)
GLUCOSE: 126 mg/dL — AB (ref 65–99)
POTASSIUM: 3.8 mmol/L (ref 3.5–5.1)
Sodium: 141 mmol/L (ref 135–145)
Total Protein: 5.1 g/dL — ABNORMAL LOW (ref 6.5–8.1)

## 2015-06-29 LAB — GLUCOSE, CAPILLARY
GLUCOSE-CAPILLARY: 127 mg/dL — AB (ref 65–99)
GLUCOSE-CAPILLARY: 146 mg/dL — AB (ref 65–99)
GLUCOSE-CAPILLARY: 101 mg/dL — AB (ref 65–99)
Glucose-Capillary: 114 mg/dL — ABNORMAL HIGH (ref 65–99)
Glucose-Capillary: 116 mg/dL — ABNORMAL HIGH (ref 65–99)
Glucose-Capillary: 117 mg/dL — ABNORMAL HIGH (ref 65–99)
Glucose-Capillary: 137 mg/dL — ABNORMAL HIGH (ref 65–99)
Glucose-Capillary: 139 mg/dL — ABNORMAL HIGH (ref 65–99)

## 2015-06-29 LAB — CBC
HEMATOCRIT: 38.5 % — AB (ref 39.0–52.0)
HEMOGLOBIN: 12.2 g/dL — AB (ref 13.0–17.0)
MCH: 28 pg (ref 26.0–34.0)
MCHC: 31.7 g/dL (ref 30.0–36.0)
MCV: 88.3 fL (ref 78.0–100.0)
Platelets: 283 10*3/uL (ref 150–400)
RBC: 4.36 MIL/uL (ref 4.22–5.81)
RDW: 16.1 % — ABNORMAL HIGH (ref 11.5–15.5)
WBC: 13 10*3/uL — ABNORMAL HIGH (ref 4.0–10.5)

## 2015-06-29 LAB — PREALBUMIN: Prealbumin: 7.7 mg/dL — ABNORMAL LOW (ref 18–38)

## 2015-06-29 LAB — PHOSPHORUS
PHOSPHORUS: 3 mg/dL (ref 2.5–4.6)
Phosphorus: 3 mg/dL (ref 2.5–4.6)

## 2015-06-29 LAB — MAGNESIUM
MAGNESIUM: 1.9 mg/dL (ref 1.7–2.4)
Magnesium: 1.9 mg/dL (ref 1.7–2.4)

## 2015-06-29 LAB — TRIGLYCERIDES: Triglycerides: 126 mg/dL (ref <150)

## 2015-06-29 MED ORDER — WHITE PETROLATUM GEL
Freq: Every day | Status: DC
  Administered 2015-06-29 – 2015-07-08 (×7): 0.2 via TOPICAL
  Filled 2015-06-29 (×11): qty 28.35

## 2015-06-29 MED ORDER — COLLAGENASE 250 UNIT/GM EX OINT
TOPICAL_OINTMENT | Freq: Every day | CUTANEOUS | Status: DC
  Administered 2015-06-29 – 2015-07-06 (×8): via TOPICAL
  Administered 2015-07-07: 1 via TOPICAL
  Administered 2015-07-08: 11:00:00 via TOPICAL
  Filled 2015-06-29: qty 30

## 2015-06-29 MED ORDER — QUETIAPINE FUMARATE 25 MG PO TABS
25.0000 mg | ORAL_TABLET | Freq: Two times a day (BID) | ORAL | Status: DC
  Administered 2015-06-29: 25 mg via ORAL
  Filled 2015-06-29 (×3): qty 1

## 2015-06-29 NOTE — Progress Notes (Signed)
Pt is on his nightly CPAP at this time. Pt states to me that he wears CPAP at home blended with 5L O2. 5L of oxygen is blended into his CPAP. Pt also states that his settings is 14CPAP. Patient is stable at this time and states that he is tired. O2 sats presents at 95%.

## 2015-06-29 NOTE — Progress Notes (Signed)
Physical Therapy Treatment Patient Details Name: Jonathan Le MRN: [id number] DOB: [birth year] Today's Date: 07-25-15    History of Present Illness Adm 8/4 with sepsis; ischemic bowel; 8/4 colectomy with abd VAC placed; 8/6 ilieostomy; extubated 8/8 PMHx-CABG 05/22/15, HTN, OA    PT Comments    Initiated PT session and pt requiring max cues to stay awake and attend to completing exercises. RN reports he had Xanax around 10 AM and likely the cause for lethargy. Will attempt again later today and proceed with mobility if more alert.   Follow Up Recommendations  Home health PT;Supervision for mobility/OOB     Equipment Recommendations  None recommended by PT    Recommendations for Other Services OT consult     Precautions / Restrictions Precautions Precautions: Sternal;Fall Precaution Comments: pt too lethargic to state    Mobility  Bed Mobility                  Transfers                    Ambulation/Gait                 Stairs            Wheelchair Mobility    Modified Rankin (Stroke Patients Only)       Balance                                    Cognition Arousal/Alertness: Lethargic;Suspect due to medications (RN reports xanax given 10 AM) Behavior During Therapy: Flat affect Overall Cognitive Status: Difficult to assess                      Exercises General Exercises - Lower Extremity Ankle Circles/Pumps: AROM;PROM;Both;5 reps (pt did 5 active; fell asleep and heel cord stretch completed) Heel Slides: AAROM;Both;5 reps (assisted flexion; resisted extension)    General Comments        Pertinent Vitals/Pain Pain Assessment: No/denies pain (no grimacing or reports of pain)    Home Living                      Prior Function            PT Goals (current goals can now be found in the care plan section) Acute Rehab PT Goals Patient Stated Goal: 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 Time For Goal Achievement: 07/02/15 Progress towards PT goals: Not progressing toward goals - comment (lethargy)    Frequency  Min 3X/week    PT Plan  (TBA)    Co-evaluation             End of Session Equipment Utilized During Treatment: Oxygen Activity Tolerance: Patient limited by lethargy Patient left: in bed;with call bell/phone within reach;with nursing/sitter in room;with family/visitor present;with SCD's reapplied     Time: 1241-1302 PT Time Calculation (min) (ACUTE ONLY): 21 min  Charges:  $Therapeutic Exercise: 8-22 mins                    G Codes:      Shanese Riemenschneider 07-25-2015, 1:41 PM Pager [phone]

## 2015-06-29 NOTE — Care Management Important Message (Signed)
Important Message  Patient Details  Name: Jonathan Le MRN: [id number] Date of Birth: 1946/07/31   Medicare Important Message Given:  Yes-fourth notification given    Kyla Balzarine 06/29/2015, 11:03 AMImportant Message  Patient Details  Name: Jonathan Le MRN: [id number] Date of Birth: Jul 08, 1946   Medicare Important Message Given:  Yes-fourth notification given    Kyla Balzarine 06/29/2015, 11:03 AM

## 2015-06-29 NOTE — Progress Notes (Signed)
eLink Physician-Brief Progress Note Patient Name: OZIL STETTLER DOB: [birth year] MRN: [id number]   Date of Service  06/29/2015  HPI/Events of Note  Delirium and confusion.   eICU Interventions  Will order: 1. Seroquel 25 mg PO BID.     Intervention Category Major Interventions: Delirium, psychosis, severe agitation - evaluation and management  Sommer,Steven Eugene 06/29/2015, 9:34 PM

## 2015-06-29 NOTE — Progress Notes (Signed)
Spoke with patient's wife Jonathan Le regarding plan of care for tonight. Discussed the use of lavender infused 2 x 2 gauze and lavender infused lotion. Discussed with Dr. Arsenio Loader the concerns from [place] regarding the use of Xanax. Orders received to give seroquel.Will evaluate the qtc interval.

## 2015-06-29 NOTE — Progress Notes (Signed)
PULMONARY / CRITICAL CARE MEDICINE   Name: Jonathan Le MRN: [id number] DOB: 1946/05/26    ADMISSION DATE:  06/18/2015 CONSULTATION DATE: 06/18/15   CHIEF COMPLAINT:  Septic shock secondary to ischemic colon. Acute respiratiory failure. Acute renal failure.  INITIAL PRESENTATION: 69 year old male with PMH of OSA and HTN. 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 Post discharge course complicated by R leg cellulitis after endoscopic vein harvest he was seen by Dr. Donata Clay in clinic for this,was having some constipation. 8/4 PM he took an enema and was straining in bathroom. He became diaphoretic and had loss of consciousness. In ED he was noted to be profoundly hypotensive with SOB. Found to be in severe septic shock with ischemic colon s/p total abd colectomy on 8/4  STUDIES:  Echo (8/4) estimated ejection fraction was in the range of 25% to 30%. Severe hypokinesis of the anteroseptal and anterior myocardium.Dyskinesis of the apical myocardium. .  CT chest abd pelvis (8/4) CTA CHEST: Limited CTA of the chest, poor bolus timing. Moderate to severe cardiomegaly with small pericardial effusion.Evidence of pulmonary edema with small pleural effusions and, patchy airspace opacities most consistent with confluent edema.  CT ABDOMEN AND PELVIS: Focal colonic hepatic flexure pneumatosis without bowel perforation. Moderate to large amount of distal retained large bowel stool. Mild probable ileus, no bowel obstruction.  Bilateral nonobstructing nephrolithiasis measuring up to 4 mm.  CT abdomen 8/12 >> no evidence intra-abd abscess, ascites present, GB sludge, small B pleural effusions.   SIGNIFICANT EVENTS: 8/4- Total abdominal colectomy with abdominal vac placement 8/6 Exploratory laparotomy, removal of  prior packs, closure of open abdomen, ileostomy 8/7 high pressors 8/8 self extubated 8/9  resp distress , required bipap , lasix 8/13 had to restart levophed for shock overnight.  SUBJECTIVE:   Up to chair, continues to have some delirium,  Had a period of somnolence 8/14 that prompted ABG as below.   VITAL SIGNS: Temp:  [97.5 F (36.4 C)-98 F (36.7 C)] 97.6 F (36.4 C) (08/15 0742) Pulse Rate:  [76-87] 85 (08/15 0700) Resp:  [18-38] 37 (08/15 0700) BP: (86-120)/(45-94) 96/45 mmHg (08/15 0700) SpO2:  [89 %-100 %] 96 % (08/15 0700) Weight:  [139.6 kg (307 lb 12.2 oz)] 139.6 kg (307 lb 12.2 oz) (08/15 0453)   HEMODYNAMICS:    VENTILATOR SETTINGS:    INTAKE / OUTPUT:  Intake/Output Summary (Last 24 hours) at 06/29/15 0839 Last data filed at 06/29/15 0600  Gross per 24 hour  Intake 2681.2 ml  Output   4130 ml  Net -1448.8 ml   PHYSICAL EXAMINATION: General: no resp distress,  anxious Neuro:awake, interactive, non focal Head: [place]/AT EENT: PERRL, no JVD noted Cardiovascular: Regular Rate and rhythm. No MRG Lungs: Decreased breath sounds antr Abdomen: Soft, non-tender, surgical dressing in place Ext: 2+ edema  LABS:  CBC  Recent Labs Lab 06/27/15 0535 06/28/15 0520 06/29/15 0610  WBC 24.1* 19.3* 13.0*  HGB 12.5* 12.2* 12.2*  HCT 37.7* 37.6* 38.5*  PLT 195 241 283   Coag's  Recent Labs Lab 06/27/15 0535 06/28/15 0520 06/29/15 0610  APTT 30 31 30   INR 1.40 1.35 1.31   BMET  Recent Labs Lab 06/27/15 0535 06/28/15 0520 06/29/15 0610  NA 138 140 141  139  K 4.2 3.9 3.8  3.7  CL 107 104 104  102  CO2 25 29 32  30  BUN CREATININE 0.48* 0.51* 0.55*  0.53*  GLUCOSE 125* 121* 126*  126*   Electrolytes  Recent Labs Lab 06/26/15 0415 06/27/15 0535 06/28/15 0520 06/29/15 0610  CALCIUM 8.4* 8.3* 8.3* 8.4*  8.3*  MG 1.8  --  1.8 1.9  1.9  PHOS 3.3  --  3.1 3.0  3.0   Sepsis Markers No results for input(s):  LATICACIDVEN, PROCALCITON, O2SATVEN in the last 168 hours. ABG  Recent Labs Lab 06/22/15 1632 06/23/15 1616 06/28/15 1555  PHART 7.349* 7.418 7.437  PCO2ART 41.7 34.7* 42.9  PO2ART 70.0* 57.0* 76.8*   Liver Enzymes  Recent Labs Lab 06/25/15 0430 06/29/15 0610  AST 21 29  ALT 14* 16*  ALKPHOS 75 85  BILITOT 1.0 1.2  ALBUMIN 1.1* 1.3*   Cardiac Enzymes No results for input(s): TROPONINI, PROBNP in the last 168 hours. Glucose  Recent Labs Lab 06/28/15 0851 06/28/15 1230 06/28/15 1656 06/28/15 1941 06/28/15 2342 06/29/15 0359  GLUCAP 102* 134* 139* 124* 139* 116*   Imaging No results found. ASSESSMENT / PLAN:  PULMONARY A: Acute resp failure, post -op OSA ETT 8/4 >>8/8 Episode of resp distress 8/9 - ? Fluid overload vs aspiration  P:   IS per RT protocol. Nocturnal CPAP.  CARDIOVASCULAR A: Septic shock on pressors Ischemic cardiomyopathy -H/O CABG on 7/16 P:  Taper levophed gtt as able, will change goal to SBP > 90 Hold diuresis today 8/15, note remains net positive; goal restart once pressors off  RENAL A:  AKI -resolved Hypomag P:   D/C IVF.  Replete lytes as needed. Follow BMP   GASTROINTESTINAL A:  s/p total colectomy for ischemic bowel S/p  ileostomy  P:   Continue TPN, advance PO per speech pathology, once able to take full diet then will d/c TPN. Protonix for SUP.  HEMATOLOGIC A: Leucocytosis P:  Transfusion only if HB <7.  INFECTIOUS A:  Septic shock from ischemic bowel. All cultures are negative to date.  Leucocytosis -trending down P:   Abx: Ceftaz 8/4>> Vanco 8/5>> 8/6 Flagyl 8/4>> 8/11 Diflucan 8/4>>8/11  Ct Abx x 14 ds, rpt CT shows no abscess. Right foot ulcer - wound care follow up  ENDOCRINE A:  Hyperglycemia without h/o DM P:  SSI   NEUROLOGIC A: Pain, Anxiety  deconditioning P:   Fent intermittently, has not needed any. PT following. PRN ativan, not needed at this time > d/c Restarted home xanax (1  mg PO BID) 7/14. Not clear that this has changed his confusion  FAMILY  - Updates: no family bedside.  GLOBAL:  Goal wean norepi off then plan a transition to SDU   Independent CC time 35 minutes   Levy Pupa, MD, PhD 06/29/2015, 8:47 AM [facility] [phone] or if no answer [phone]

## 2015-06-29 NOTE — Progress Notes (Signed)
9 Days Post-Op  Subjective: Patient sitting up in chair Awake, alert Ileostomy with loose output VAC change TThSat  Objective: Vital signs in last 24 hours: Temp:  [97.5 F (36.4 C)-98 F (36.7 C)] 97.6 F (36.4 C) (08/15 0742) Pulse Rate:  [76-88] 88 (08/15 0900) Resp:  [18-44] 29 (08/15 0900) BP: (86-120)/(44-94) 91/44 mmHg (08/15 0900) SpO2:  [89 %-100 %] 98 % (08/15 0900) Weight:  [139.6 kg (307 lb 12.2 oz)] 139.6 kg (307 lb 12.2 oz) (08/15 0453) Last BM Date: 06/23/15  Intake/Output from previous day: 08/14 0701 - 08/15 0700 In: 2736.8 [P.O.:1440; I.V.:536.8; IV Piggyback:100; TPN:660] Out: 4180 [Urine:2905; Drains:175; Stool:1100] Intake/Output this shift: Total I/O In: 50 [I.V.:20; TPN:30] Out: 150 [Urine:150]  General appearance: alert, cooperative and no distress Resp: clear to auscultation bilaterally Cardio: regular rate and rhythm, S1, S2 normal, no murmur, click, rub or gallop GI: + bowel sounds; some incisional tenderness Wound VAC with good seal  Lab Results:   Recent Labs  06/28/15 0520 06/29/15 0610  WBC 19.3* 13.0*  HGB 12.2* 12.2*  HCT 37.6* 38.5*  PLT 241 283   BMET  Recent Labs  06/28/15 0520 06/29/15 0610  NA 140 141  139  K 3.9 3.8  3.7  CL 104 104  102  CO2 29 32  30  GLUCOSE 121* 126*  126*  BUN CREATININE 0.51* 0.55*  0.53*  CALCIUM 8.3* 8.4*  8.3*   PT/INR  Recent Labs  06/28/15 0520 06/29/15 0610  LABPROT 16.8* 16.4*  INR 1.35 1.31   ABG  Recent Labs  06/28/15 1555  PHART 7.437  HCO3 28.5*    Studies/Results: No results found.  Anti-infectives: Anti-infectives    Start     Dose/Rate Route Frequency Ordered Stop   06/20/15 2200  cefTAZidime (FORTAZ) 2 g in dextrose 5 % 50 mL IVPB     2 g 100 mL/hr over 30 Minutes Intravenous 3 times per day 06/20/15 1537     06/20/15 0200  vancomycin (VANCOCIN) IVPB 1000 mg/200 mL premix  Status:  Discontinued     1,000 mg 200 mL/hr over 60 Minutes  Intravenous Every 12 hours 06/19/15 1512 06/20/15 0737   06/19/15 1200  vancomycin (VANCOCIN) 1,750 mg in sodium chloride 0.9 % 500 mL IVPB  Status:  Discontinued     1,750 mg 250 mL/hr over 120 Minutes Intravenous Every 24 hours 06/18/15 1030 06/19/15 1512   06/18/15 1200  fluconazole (DIFLUCAN) IVPB 400 mg  Status:  Discontinued     400 mg 100 mL/hr over 120 Minutes Intravenous Every 24 hours 06/18/15 1050 06/25/15 0925   06/18/15 1130  vancomycin (VANCOCIN) 2,500 mg in sodium chloride 0.9 % 500 mL IVPB     2,500 mg 250 mL/hr over 120 Minutes Intravenous  Once 06/18/15 1030 06/18/15 1314   06/18/15 1130  cefTAZidime (FORTAZ) 2 g in dextrose 5 % 50 mL IVPB  Status:  Discontinued     2 g 100 mL/hr over 30 Minutes Intravenous Every 12 hours 06/18/15 1030 06/20/15 1537   06/18/15 1030  metroNIDAZOLE (FLAGYL) IVPB 500 mg  Status:  Discontinued     500 mg 100 mL/hr over 60 Minutes Intravenous Every 8 hours 06/18/15 1000 06/25/15 0925      Assessment/Plan: s/p Procedure(s): WASHOUT OF ABDOMEN, CLOSURE OF ABDOMINAL WALL WITH PLACEMENT OF WOUND VAC AND ILLEOSTOMY  (N/A) Good bowel function - weaning off TNA today  Change VAC as directed Monitor fluid status with  large ileostomy output.  LOS: 11 days    [name] K. 06/29/2015

## 2015-06-29 NOTE — Progress Notes (Signed)
Speech Language Pathology Treatment: Dysphagia  Patient Details Name: RANDAL GOENS MRN: [id number] DOB: 29-Mar-1946 Today's Date: 06/29/2015 Time: 0840-0900 SLP Time Calculation (min) (ACUTE ONLY): 20 min  Assessment / Plan / Recommendation Clinical Impression  Pt seen for therapeutic interventions and diagnostic treatment following FEES on Friday and recovery over the weekend. Pt taking pudding thick liquids with occasional cough, possibly unrelated. Respiratory status has remained stable over the weekend, so suspect he is tolerating this well. Pt still mildly dysphonic, observed to still have decreased laryngeal elevation, as seen on FEES. Cued pt for effortful swallow x10 with subjective success. Trials of nectar thick liquids tolerated well, though given silent aspiration, repeat objective test warranted prior to diet upgrade. Will plan for FEES tomorrow.    HPI Other Pertinent Information: 69 year old male with PMH of OSA and HTN. 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 Post discharge course complicated by R leg cellulitis after endoscopic vein harvest he was seen by Dr. Donata Clay in clinic for this,was having some constipation. 8/4 PM he took an enema and was straining in bathroom. He became diaphoretic and had loss of consciousness. In ED he was noted to be profoundly hypotensive with SOB. Found to be in severe septic shock with ischemic colon s/p total abd colectomy on 8/4, intubated, 8/6 ilieostomy; self-extubated 8/8.  Started on clear liquids 8/9. Episode of respiratory distress 8/9, ? fluid overload vs aspiration. CXR 8/10; Increased density at both lung bases consistent with progressive.    Pertinent Vitals    SLP Plan   (FEES tomorrow)    Recommendations Diet recommendations: Dysphagia 3 (mechanical  soft);Pudding-thick liquid Liquids provided via: Teaspoon Medication Administration: Whole meds with puree Supervision: Patient able to self feed Compensations: Slow rate;Small sips/bites;Multiple dry swallows after each bite/sip Postural Changes and/or Swallow Maneuvers: Seated upright 90 degrees              Plan:  (FEES tomorrow)    GO    Harlon Ditty, MA CCC-SLP [phone]  Claudine Mouton 06/29/2015, 9:06 AM

## 2015-06-29 NOTE — Consult Note (Signed)
WOC wound consult note Reason for Consult: right foot wound, which appears to have been present on admission Pt is confused and not able to really give me any details and no family at the bedside, has safety sitting in place.  Wound type: Unclear etiology, right foot wound, doppler pulses, 3+pitting edema Also noted per nursing documentation to have gluteal crease DTI and Unstageable device related pressure injury on the left nare. Interesting as well patient has abdominal NPWT and RLQ ileostomy that our team had not been consulted for.  Pressure Ulcer POA:No Measurement: Right foot wound 1cm x 4cm x 0.2cm  Left nare: 1.5cm x 0.5cm x 0 Abdominal wound, surgical-VAC intact, due for change tom Gluteal cleft, maroon tissue Wound bed: Right foot wound: 100% fibrin and slough Nare: crusted, scab Abdomen with VAC in place Gluteal cleft, dark purple with intact skin per bedside nurse Drainage (amount, consistency, odor) minimal, no odor from the foot, no drainage from the other sites.  Periwound: intact  Dressing procedure/placement/frequency: Add enzymatic debridement ointment to the right foot wound. Patient on Sport mattress while in the ICU.  Silicone foam to the gluteal cleft wound. Vaseline to the left nare.    WOC ostomy consult note Stoma type/location:  RLQ, end ileostomy Stomal assessment/size: pouch just changed and patient is confused Output: yellow/brown  Ostomy pouching: 2pc. 2 3/4" in place, intact  Education provided: none, not appropriate at this time Will bring by educational materials for wife in the am Enrolled patient in [employer] DC program: Yes/No  WOC team will follow along with you for support with wound and ostomy care. Makaylynn Bonillas [place], [place] [phone]

## 2015-06-29 NOTE — Progress Notes (Addendum)
Patient's Jonathan Le wife express concern regarding Xanax.  She reports that he attempted to use Xanax as out patient and it made him feel like a "zombie" and took several days to wear off.  She also expressed concerns about his confusion that in her opinion is getting worse.  Offered her support and reviewed notes from physicians for today with her.  [name] reported she felt more informed after our conversation and would like to speak with MD tomorrow on 06/30/15.  Contact number is # [phone].  Jacqulyn Cane RN, BSN, CCRN

## 2015-06-29 NOTE — Care Management Note (Signed)
Case Management Note  Patient Details  Name: Jonathan Le MRN: [id number] Date of Birth: Jun 27, 1946  Subjective/Objective:        Lives at home with wife.  Started back on pressors, continues on TNA.  For swallowing studies today.  Has VAC - superficial - need to clarify if will need on discharge.             Action/Plan:   Expected Discharge Date:                  Expected Discharge Plan:  Home w Home Health Services  In-House Referral:     Discharge planning Services  CM Consult  Post Acute Care Choice:    Choice offered to:     DME Arranged:    DME Agency:     HH Arranged:    HH Agency:     Status of Service:  Completed, signed off  Medicare Important Message Given:  Yes-third notification given Date Medicare IM Given:    Medicare IM give by:    Date Additional Medicare IM Given:    Additional Medicare Important Message give by:     If discussed at Long Length of Stay Meetings, dates discussed:    Additional Comments:  Vangie Bicker, RN 06/29/2015, 10:30 AM

## 2015-06-29 NOTE — Progress Notes (Signed)
Pt now doesn't want to wear his CPAP.Jonathan Le Refusing

## 2015-06-29 NOTE — Progress Notes (Signed)
9 Days Post-Op Procedure(s) (LRB): WASHOUT OF ABDOMEN, CLOSURE OF ABDOMINAL WALL WITH PLACEMENT OF WOUND VAC AND ILLEOSTOMY  (N/A) Subjective: UP in chair NSR TNA on last bag with FEES planned in am On thick liquids with few ice chips  Objective: Vital signs in last 24 hours: Temp:  [97.5 F (36.4 C)-98 F (36.7 C)] 97.6 F (36.4 C) (08/15 0742) Pulse Rate:  [76-88] 88 (08/15 0900) Cardiac Rhythm:  [-] Normal sinus rhythm;Bundle branch block (08/15 0800) Resp:  [18-44] 29 (08/15 0900) BP: (86-120)/(44-94) 91/44 mmHg (08/15 0900) SpO2:  [89 %-100 %] 98 % (08/15 0900) Weight:  [307 lb 12.2 oz (139.6 kg)] 307 lb 12.2 oz (139.6 kg) (08/15 0453)  Hemodynamic parameters for last 24 hours:  stable  Intake/Output from previous day: 08/14 0701 - 08/15 0700 In: 2786.8 [P.O.:1440; I.V.:536.8; IV Piggyback:150; TPN:660] Out: 4180 [Urine:2905; Drains:175; Stool:1100] Intake/Output this shift: Total I/O In: 50 [I.V.:20; TPN:30] Out: 150 [Urine:150]  Lungs clear No leg cellulitis or edema  Lab Results:  Recent Labs  06/28/15 0520 06/29/15 0610  WBC 19.3* 13.0*  HGB 12.2* 12.2*  HCT 37.6* 38.5*  PLT 241 283   BMET:  Recent Labs  06/28/15 0520 06/29/15 0610  NA 140 141  139  K 3.9 3.8  3.7  CL 104 104  102  CO2 29 32  30  GLUCOSE 121* 126*  126*  BUN CREATININE 0.51* 0.55*  0.53*  CALCIUM 8.3* 8.4*  8.3*    PT/INR:  Recent Labs  06/29/15 0610  LABPROT 16.4*  INR 1.31   ABG    Component Value Date/Time   PHART 7.437 06/28/2015 1555   HCO3 28.5* 06/28/2015 1555   TCO2 29.8 06/28/2015 1555   ACIDBASEDEF 2.0 06/23/2015 1616   O2SAT 94.3 06/28/2015 1555   CBG (last 3)   Recent Labs  06/28/15 2342 06/29/15 0359 06/29/15 0739  GLUCAP 139* 116* 117*    Assessment/Plan: S/P Procedure(s) (LRB): WASHOUT OF ABDOMEN, CLOSURE OF ABDOMINAL WALL WITH PLACEMENT OF WOUND VAC AND ILLEOSTOMY  (N/A) Mobilize Diuresis Diabetes control   LOS:  11 days    Jonathan Le 06/29/2015

## 2015-06-30 ENCOUNTER — Inpatient Hospital Stay (HOSPITAL_COMMUNITY): Payer: Medicare Other

## 2015-06-30 DIAGNOSIS — J9600 Acute respiratory failure, unspecified whether with hypoxia or hypercapnia: Secondary | ICD-10-CM | POA: Insufficient documentation

## 2015-06-30 DIAGNOSIS — K659 Peritonitis, unspecified: Secondary | ICD-10-CM

## 2015-06-30 DIAGNOSIS — R410 Disorientation, unspecified: Secondary | ICD-10-CM

## 2015-06-30 LAB — APTT: aPTT: 31 seconds (ref 24–37)

## 2015-06-30 LAB — GLUCOSE, CAPILLARY
GLUCOSE-CAPILLARY: 115 mg/dL — AB (ref 65–99)
Glucose-Capillary: 101 mg/dL — ABNORMAL HIGH (ref 65–99)
Glucose-Capillary: 114 mg/dL — ABNORMAL HIGH (ref 65–99)
Glucose-Capillary: 129 mg/dL — ABNORMAL HIGH (ref 65–99)
Glucose-Capillary: 84 mg/dL (ref 65–99)
Glucose-Capillary: 91 mg/dL (ref 65–99)
Glucose-Capillary: 95 mg/dL (ref 65–99)

## 2015-06-30 LAB — POCT I-STAT 3, ART BLOOD GAS (G3+)
ACID-BASE EXCESS: 6 mmol/L — AB (ref 0.0–2.0)
Acid-Base Excess: 3 mmol/L — ABNORMAL HIGH (ref 0.0–2.0)
BICARBONATE: 27.2 meq/L — AB (ref 20.0–24.0)
BICARBONATE: 31.4 meq/L — AB (ref 20.0–24.0)
O2 Saturation: 93 %
O2 Saturation: 100 %
PH ART: 7.472 — AB (ref 7.350–7.450)
PH ART: 7.413 (ref 7.350–7.450)
PO2 ART: 63 mmHg — AB (ref 80.0–100.0)
PO2 ART: 337 mmHg — AB (ref 80.0–100.0)
Patient temperature: 98.3
TCO2: 28 mmol/L (ref 0–100)
TCO2: 33 mmol/L (ref 0–100)
pCO2 arterial: 37.1 mmHg (ref 35.0–45.0)
pCO2 arterial: 49.2 mmHg — ABNORMAL HIGH (ref 35.0–45.0)

## 2015-06-30 LAB — PROTIME-INR
INR: 1.3 (ref 0.00–1.49)
PROTHROMBIN TIME: 16.3 s — AB (ref 11.6–15.2)

## 2015-06-30 LAB — TRIGLYCERIDES: TRIGLYCERIDES: 137 mg/dL (ref <150)

## 2015-06-30 MED ORDER — ETOMIDATE 2 MG/ML IV SOLN
20.0000 mg | Freq: Once | INTRAVENOUS | Status: AC
  Administered 2015-06-30: 20 mg via INTRAVENOUS

## 2015-06-30 MED ORDER — ROCURONIUM BROMIDE 50 MG/5ML IV SOLN
50.0000 mg | Freq: Once | INTRAVENOUS | Status: AC
Start: 2015-06-30 — End: 2015-06-30
  Administered 2015-06-30: 50 mg via INTRAVENOUS

## 2015-06-30 MED ORDER — PROPOFOL 1000 MG/100ML IV EMUL
0.0000 ug/kg/min | INTRAVENOUS | Status: DC
Start: 2015-06-30 — End: 2015-07-03
  Administered 2015-06-30: 17 ug/kg/min via INTRAVENOUS
  Administered 2015-06-30 – 2015-07-01 (×2): 20 ug/kg/min via INTRAVENOUS
  Administered 2015-07-01: 15 ug/kg/min via INTRAVENOUS
  Administered 2015-07-01: 20 ug/kg/min via INTRAVENOUS
  Administered 2015-07-01: 15 ug/kg/min via INTRAVENOUS
  Administered 2015-07-01: 20 ug/kg/min via INTRAVENOUS
  Administered 2015-07-02 – 2015-07-03 (×4): 15 ug/kg/min via INTRAVENOUS
  Filled 2015-06-30 (×11): qty 100

## 2015-06-30 MED ORDER — FENTANYL CITRATE (PF) 100 MCG/2ML IJ SOLN
100.0000 ug | Freq: Once | INTRAMUSCULAR | Status: AC
  Administered 2015-06-30: 100 ug via INTRAVENOUS

## 2015-06-30 MED ORDER — FENTANYL CITRATE (PF) 100 MCG/2ML IJ SOLN
INTRAMUSCULAR | Status: AC
  Filled 2015-06-30: qty 2

## 2015-06-30 MED ORDER — FENTANYL CITRATE (PF) 100 MCG/2ML IJ SOLN: 50.0000 ug | INTRAMUSCULAR | Status: DC | PRN

## 2015-06-30 MED ORDER — PANTOPRAZOLE SODIUM 40 MG PO PACK
40.0000 mg | PACK | Freq: Every day | ORAL | Status: DC
  Administered 2015-06-30 – 2015-07-04 (×5): 40 mg
  Filled 2015-06-30 (×7): qty 20

## 2015-06-30 MED ORDER — FENTANYL CITRATE (PF) 100 MCG/2ML IJ SOLN
50.0000 ug | INTRAMUSCULAR | Status: DC | PRN
  Administered 2015-07-03: 50 ug via INTRAVENOUS
  Filled 2015-06-30: qty 2

## 2015-06-30 MED ORDER — MIDAZOLAM HCL 2 MG/2ML IJ SOLN
2.0000 mg | Freq: Once | INTRAMUSCULAR | Status: AC
Start: 2015-06-30 — End: 2015-06-30
  Administered 2015-06-30: 2 mg via INTRAVENOUS

## 2015-06-30 MED ORDER — MIDAZOLAM HCL 2 MG/2ML IJ SOLN
INTRAMUSCULAR | Status: AC
  Filled 2015-06-30: qty 2

## 2015-06-30 MED ORDER — FUROSEMIDE 10 MG/ML IJ SOLN
40.0000 mg | Freq: Three times a day (TID) | INTRAMUSCULAR | Status: AC
  Administered 2015-06-30 – 2015-07-01 (×2): 40 mg via INTRAVENOUS
  Filled 2015-06-30 (×2): qty 4

## 2015-06-30 MED ORDER — SODIUM CHLORIDE 0.9 % IV SOLN
INTRAVENOUS | Status: DC
  Administered 2015-07-02 – 2015-07-06 (×2): via INTRAVENOUS
  Administered 2015-07-07: 10 mL/h via INTRAVENOUS

## 2015-06-30 MED ORDER — VITAL HIGH PROTEIN PO LIQD
1000.0000 mL | ORAL | Status: DC
  Filled 2015-06-30 (×3): qty 1000

## 2015-06-30 MED ORDER — PANTOPRAZOLE SODIUM 40 MG PO TBEC: 40.0000 mg | DELAYED_RELEASE_TABLET | Freq: Every day | ORAL | Status: DC

## 2015-06-30 NOTE — Progress Notes (Signed)
eLink Physician-Brief Progress Note Patient Name: Jonathan Le DOB: 1946-04-10 MRN: [id number]   Date of Service  06/30/2015  HPI/Events of Note  Called d/t increased RR into the 40's! Patient remains delirious and confused.   eICU Interventions  Will place patient on BiPAP IPAP 12/EPAP 5. Check ABG at 5 PM. I have asked the floor team to evaluate him as well.      Intervention Category Major Interventions: Respiratory failure - evaluation and management  [name] 06/30/2015, 4:00 PM

## 2015-06-30 NOTE — Progress Notes (Addendum)
Called by e-link who was called by bedside RN.  Patient is breathing in the 40's and desaturating.  No CXR since the 10th, will order one.  Attempting BiPAP with f/u ABG now.  If ABG is acidotic or patient does not respond to BiPAP then will proceed with intubation.  Will diurese patient and continue to monitor.  Attempted to call wife x2, went to voice mail.  No voice mail left.  ABG with hypoxemic respiratory failure.  Will intubate and give lasix if BP allows.  KVO IVF.  The patient is critically ill with multiple organ systems failure and requires high complexity decision making for assessment and support, frequent evaluation and titration of therapies, application of advanced monitoring technologies and extensive interpretation of multiple databases.   Critical Care Time devoted to patient care services described in this note is  35  Minutes. This time reflects time of care of this signee Dr [name]. This critical care time does not reflect procedure time, or teaching time or supervisory time of PA/NP/Med student/Med Resident etc but could involve care discussion time.  Alyson Reedy, M.D. [facility] Pulmonary/Critical Care Medicine. Pager: [phone]. After hours pager: [phone].

## 2015-06-30 NOTE — Procedures (Signed)
OBT Placement Under Glidescope Guidance  Post intubation, patient was sedated and paralyzed.  OGT placed into the esophagus under visual guidance.  Alyson Reedy, M.D. [facility] Pulmonary/Critical Care Medicine. Pager: [phone]. After hours pager: [phone].

## 2015-06-30 NOTE — Progress Notes (Signed)
SLP Cancellation Note  Patient Details Name: YANI LAL MRN: [id number] DOB: 1946-07-12   Cancelled treatment:       Reason Eval/Treat Not Completed: Fatigue/lethargy limiting ability to participate. Planned for FEES today for potential upgrade to thinner liquids, but pt lethargic, unlikely to be successful. Will check on pt tomorrow for readiness.    Wyman Meschke, Riley Nearing 06/30/2015, 10:13 AM

## 2015-06-30 NOTE — Progress Notes (Signed)
Patient ID: Jonathan Le, male   DOB: 08/27/1946, 69 y.o.   MRN: [id number] 10 Days Post-Op  Subjective: Pt sleeping.  Off pressors  Objective: Vital signs in last 24 hours: Temp:  [96.7 F (35.9 C)-98.4 F (36.9 C)] 98.4 F (36.9 C) (08/16 0822) Pulse Rate:  [78-98] 79 (08/16 0700) Resp:  [23-45] 35 (08/16 0700) BP: (91-122)/(40-69) 119/41 mmHg (08/16 0700) SpO2:  [92 %-100 %] 100 % (08/16 0700) Weight:  [139.5 kg (307 lb 8.7 oz)] 139.5 kg (307 lb 8.7 oz) (08/16 0500) Last BM Date: 06/23/15  Intake/Output from previous day: 08/15 0701 - 08/16 0700 In: 958.6 [I.V.:478.6; IV Piggyback:150; TPN:330] Out: 1285 [Urine:835; Drains:25; Stool:425] Intake/Output this shift: Total I/O In: 20 [I.V.:20] Out: 95 [Urine:45; Drains:50]  PE: Abd: soft, doesn't respond with palpation to abdomen, +BS, ileostomy with thicker output.  Midline wound with VAC in place  Lab Results:   Recent Labs  06/28/15 0520 06/29/15 0610  WBC 19.3* 13.0*  HGB 12.2* 12.2*  HCT 37.6* 38.5*  PLT 241 283   BMET  Recent Labs  06/28/15 0520 06/29/15 0610  NA 140 141  139  K 3.9 3.8  3.7  CL 104 104  102  CO2 29 32  30  GLUCOSE 121* 126*  126*  BUN CREATININE 0.51* 0.55*  0.53*  CALCIUM 8.3* 8.4*  8.3*   PT/INR  Recent Labs  06/29/15 0610 06/30/15 0720  LABPROT 16.4* 16.3*  INR 1.31 1.30   CMP     Component Value Date/Time   NA 139 06/29/2015 0610   NA 141 06/29/2015 0610   K 3.7 06/29/2015 0610   K 3.8 06/29/2015 0610   CL 102 06/29/2015 0610   CL 104 06/29/2015 0610   CO2 30 06/29/2015 0610   CO2 32 06/29/2015 0610   GLUCOSE 126* 06/29/2015 0610   GLUCOSE 126* 06/29/2015 0610   BUN 14 06/29/2015 0610   BUN 14 06/29/2015 0610   CREATININE 0.53* 06/29/2015 0610   CREATININE 0.55* 06/29/2015 0610   CALCIUM 8.3* 06/29/2015 0610   CALCIUM 8.4* 06/29/2015 0610   PROT 5.1* 06/29/2015 0610   ALBUMIN 1.3* 06/29/2015 0610   AST 29 06/29/2015 0610   ALT 16*  06/29/2015 0610   ALKPHOS 85 06/29/2015 0610   BILITOT 1.2 06/29/2015 0610   GFRNONAA >60 06/29/2015 0610   GFRNONAA >60 06/29/2015 0610   GFRAA >60 06/29/2015 0610   GFRAA >60 06/29/2015 0610   Lipase     Component Value Date/Time   LIPASE 15* 05/19/2015 2140       Studies/Results: No results found.  Anti-infectives: Anti-infectives    Start     Dose/Rate Route Frequency Ordered Stop   06/20/15 2200  cefTAZidime (FORTAZ) 2 g in dextrose 5 % 50 mL IVPB     2 g 100 mL/hr over 30 Minutes Intravenous 3 times per day 06/20/15 1537     06/20/15 0200  vancomycin (VANCOCIN) IVPB 1000 mg/200 mL premix  Status:  Discontinued     1,000 mg 200 mL/hr over 60 Minutes Intravenous Every 12 hours 06/19/15 1512 06/20/15 0737   06/19/15 1200  vancomycin (VANCOCIN) 1,750 mg in sodium chloride 0.9 % 500 mL IVPB  Status:  Discontinued     1,750 mg 250 mL/hr over 120 Minutes Intravenous Every 24 hours 06/18/15 1030 06/19/15 1512   06/18/15 1200  fluconazole (DIFLUCAN) IVPB 400 mg  Status:  Discontinued     400 mg 100 mL/hr  over 120 Minutes Intravenous Every 24 hours 06/18/15 1050 06/25/15 0925   06/18/15 1130  vancomycin (VANCOCIN) 2,500 mg in sodium chloride 0.9 % 500 mL IVPB     2,500 mg 250 mL/hr over 120 Minutes Intravenous  Once 06/18/15 1030 06/18/15 1314   06/18/15 1130  cefTAZidime (FORTAZ) 2 g in dextrose 5 % 50 mL IVPB  Status:  Discontinued     2 g 100 mL/hr over 30 Minutes Intravenous Every 12 hours 06/18/15 1030 06/20/15 1537   06/18/15 1030  metroNIDAZOLE (FLAGYL) IVPB 500 mg  Status:  Discontinued     500 mg 100 mL/hr over 60 Minutes Intravenous Every 8 hours 06/18/15 1000 06/25/15 0925       Assessment/Plan   POD 12,10, s/p ex lap with subtotal colectomy, open abdomen, take back with creation of ileostomy and closure -tolerating D3 diet with pudding thick liquids -VAC changes to midline wound, TRS -mobilize as able.  -pulm toilet -TNA off Leukocytosis -WBC 13K  yesterday.  Per CCM.  CT last week ok from abdominal standpoint DVT prophylaxis -SCDs/Heparin    LOS: 12 days    Indy Prestwood E 06/30/2015, 8:35 AM Pager: [phone]

## 2015-06-30 NOTE — Progress Notes (Signed)
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 Unable to stand from EOB or laterally scoot along EOB despite 2 person assist. Anticipate pt would benefit from use of Sara-Plus (if does not exceed weight limit) AND if OK with cardiovascular surgery (s/p CABG 05/22/15).   Follow Up Recommendations  Supervision/Assistance - 24 hour;CIR     Equipment Recommendations   (TBA)    Recommendations for Other Services OT consult     Precautions / Restrictions Precautions Precautions: Fall;Sternal (VAC to abd; ileostomy; CABG 05/2015) Precaution Comments: pt too lethargic to state Restrictions Weight Bearing Restrictions:  (sternal precautions)    Mobility  Bed Mobility Overal bed mobility: Needs Assistance;+2 for physical assistance;+ 2 for safety/equipment Bed Mobility: Supine to Sit;Sit to Supine     Supine to sit: Mod assist;+2 for physical assistance;HOB elevated;+2 for safety/equipment Sit to supine: Total assist (+3)   General bed mobility comments: HOB elevated and turned to sit at EOB to decr respiratory strain; pt so fatigued on return to bed, required 3 people  Transfers Overall transfer level: Needs assistance Equipment used: 2 person hand held assist Transfers: Lateral/Scoot Transfers;Sit to/from Stand Sit to Stand: +2 physical assistance (unable)        Lateral/Scoot Transfers: +2 physical assistance (unable) General transfer comment: vc for technique including placing Rt foot underneath knee (he tended to externally rotate and abd thigh; poor initiation of either sit to stand or attempt to laterally  scoot  Ambulation/Gait                 Stairs            Wheelchair Mobility    Modified Rankin (Stroke Patients Only)       Balance Overall balance assessment: Needs assistance Sitting-balance support: No upper extremity supported;Feet supported Sitting balance-Leahy Scale: Poor Sitting balance - Comments: close guarding to min assist Postural control: Right lateral lean     Standing balance comment: unable                    Cognition Arousal/Alertness:  (drowsy, but awake) Behavior During Therapy: Flat affect Overall Cognitive Status: Impaired/Different from baseline Area of Impairment: Following commands;Problem solving       Following Commands: Follows one step commands with increased time     Problem Solving: Slow processing;Decreased initiation;Difficulty sequencing;Requires verbal cues;Requires tactile cues      Exercises      General Comments        Pertinent Vitals/Pain VSS except RR up to 38 and returned to supine  Pain Assessment: No/denies pain    Home Living                      Prior Function            PT Goals (current goals can now be found in the care plan section) Acute Rehab PT Goals 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 PT Goal Formulation: Patient unable to participate in goal setting Time For Goal Achievement: 07/14/15 Potential to Achieve Goals: Good Progress towards PT goals: Goals downgraded-see care plan (confusion, lethargy)  Frequency  Min 3X/week    PT Plan Discharge plan needs to be updated    Co-evaluation             End of Session Equipment Utilized During Treatment: Gait belt;Oxygen Activity Tolerance: Patient limited by fatigue Patient left: in bed;with call bell/phone within reach;with bed alarm set;with nursing/sitter in room;with SCD's reapplied;with restraints reapplied (bil mitts)     Time: 1610-9604 PT Time Calculation (min) (ACUTE  ONLY): 25 min  Charges:  $Therapeutic Activity: 23-37 mins                    G Codes:      Shaniah Baltes Jul 09, 2015, 2:55 PM  Pager [phone]

## 2015-06-30 NOTE — Procedures (Signed)
Intubation Procedure Note ROWIN BAYRON [id number] 09-Mar-1946  Procedure: Intubation Indications: Respiratory insufficiency  Procedure Details Consent: Unable to obtain consent because of emergent medical necessity. Time Out: Verified patient identification, verified procedure, site/side was marked, verified correct patient position, special equipment/implants available, medications/allergies/relevent history reviewed, required imaging and test results available.  Performed  Maximum sterile technique was used including gloves, hand hygiene and mask.  MAC    Evaluation Hemodynamic Status: BP stable throughout; O2 sats: stable throughout Patient's Current Condition: stable Complications: No apparent complications Patient did tolerate procedure well. Chest X-ray ordered to verify placement.  CXR: pending.   [name] 06/30/2015

## 2015-06-30 NOTE — Progress Notes (Signed)
PULMONARY / CRITICAL CARE MEDICINE   Name: Jonathan Le MRN: [id number] DOB: [birth year]    ADMISSION DATE:  06/18/2015 CONSULTATION DATE: 06/18/15   CHIEF COMPLAINT:  Septic shock secondary to ischemic colon. Acute respiratiory failure. Acute renal failure.  INITIAL PRESENTATION: 69 year old male with PMH of OSA and HTN. 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 Post discharge course complicated by R leg cellulitis after endoscopic vein harvest he was seen by Dr. Donata Clay in clinic for this,was having some constipation. 8/4 PM he took an enema and was straining in bathroom. He became diaphoretic and had loss of consciousness. In ED he was noted to be profoundly hypotensive with SOB. Found to be in severe septic shock with ischemic colon s/p total abd colectomy on 8/4  STUDIES:  Echo (8/4) estimated ejection fraction was in the range of 25% to 30%. Severe hypokinesis of the anteroseptal and anterior myocardium.Dyskinesis of the apical myocardium. .  CT chest abd pelvis (8/4) CTA CHEST: Limited CTA of the chest, poor bolus timing. Moderate to severe cardiomegaly with small pericardial effusion.Evidence of pulmonary edema with small pleural effusions and, patchy airspace opacities most consistent with confluent edema.  CT ABDOMEN AND PELVIS: Focal colonic hepatic flexure pneumatosis without bowel perforation. Moderate to large amount of distal retained large bowel stool. Mild probable ileus, no bowel obstruction.  Bilateral nonobstructing nephrolithiasis measuring up to 4 mm.  CT abdomen 8/12 >> no evidence intra-abd abscess, ascites present, GB sludge, small B pleural effusions.   SIGNIFICANT EVENTS: 8/4- Total abdominal colectomy with abdominal vac placement 8/6 Exploratory laparotomy, removal of  prior packs, closure of open abdomen, ileostomy 8/7 high pressors 8/8 self extubated 8/9  resp distress , required bipap , lasix 8/13 had to restart levophed for shock overnight.  SUBJECTIVE:   Has had confusion, was hypersomnolent this am, could not do swallow eval  VITAL SIGNS: Temp:  [96.7 F (35.9 C)-98.4 F (36.9 C)] 98.4 F (36.9 C) (08/16 0822) Pulse Rate:  [78-98] 87 (08/16 1000) Resp:  [16-43] 16 (08/16 1000) BP: (91-122)/(36-69) 115/50 mmHg (08/16 1000) SpO2:  [95 %-100 %] 98 % (08/16 1000) Weight:  [139.5 kg (307 lb 8.7 oz)] 139.5 kg (307 lb 8.7 oz) (08/16 0500)   HEMODYNAMICS:    VENTILATOR SETTINGS:    INTAKE / OUTPUT:  Intake/Output Summary (Last 24 hours) at 06/30/15 1027 Last data filed at 06/30/15 1000  Gross per 24 hour  Intake  857.2 ml  Output   1280 ml  Net -422.8 ml   PHYSICAL EXAMINATION: General: no resp distress, comfortable Neuro:awake, interactive, non focal Head: [place]/AT EENT: PERRL, no JVD noted Cardiovascular: Regular Rate and rhythm. No MRG Lungs: Decreased breath sounds antr Abdomen: Soft, non-tender, surgical dressing in place Ext: 2+ edema  LABS:  CBC  Recent Labs Lab 06/27/15 0535 06/28/15 0520 06/29/15 0610  WBC 24.1* 19.3* 13.0*  HGB 12.5* 12.2* 12.2*  HCT 37.7* 37.6* 38.5*  PLT 195 241 283   Coag's  Recent Labs Lab 06/28/15 0520 06/29/15 0610 06/30/15 0720  APTT INR 1.35 1.31 1.30   BMET  Recent Labs Lab 06/27/15 0535 06/28/15 0520 06/29/15 0610  NA 138 140 141  139  K 4.2 3.9 3.8  3.7  CL 107 104 104  102  CO2 25  29 32  30  BUN 17 17 14  14   CREATININE 0.48* 0.51* 0.55*  0.53*  GLUCOSE 125* 121* 126*  126*   Electrolytes  Recent Labs Lab 06/26/15 0415 06/27/15 0535 06/28/15 0520 06/29/15 0610  CALCIUM 8.4* 8.3* 8.3* 8.4*  8.3*  MG 1.8  --  1.8 1.9  1.9  PHOS 3.3  --  3.1 3.0  3.0   Sepsis Markers No results for input(s): LATICACIDVEN, PROCALCITON, O2SATVEN in  the last 168 hours. ABG  Recent Labs Lab 06/23/15 1616 06/28/15 1555  PHART 7.418 7.437  PCO2ART 34.7* 42.9  PO2ART 57.0* 76.8*   Liver Enzymes  Recent Labs Lab 06/25/15 0430 06/29/15 0610  AST 21 29  ALT 14* 16*  ALKPHOS 75 85  BILITOT 1.0 1.2  ALBUMIN 1.1* 1.3*   Cardiac Enzymes No results for input(s): TROPONINI, PROBNP in the last 168 hours. Glucose  Recent Labs Lab 06/29/15 0739 06/29/15 1154 06/29/15 1603 06/29/15 2007 06/30/15 0349 06/30/15 0814  GLUCAP 117* 146* 114* 101* 95 91   Imaging No results found. ASSESSMENT / PLAN:  PULMONARY A: Acute resp failure, post -op OSA ETT 8/4 >>8/8 Episode of resp distress 8/9 - ? Fluid overload vs aspiration  P:   IS per RT protocol. Nocturnal CPAP.  CARDIOVASCULAR A: Septic shock on pressors Ischemic cardiomyopathy -H/O CABG on 7/16 P:  Norepi off since am 8/16 Hold diuresis again today 8/16, likely restart low dose lasix 8/17  RENAL A:  AKI -resolved Hypomag P:   Replete lytes as needed. Follow BMP   GASTROINTESTINAL A:  s/p total colectomy for ischemic bowel S/p  ileostomy  P:   D/c TPN, advance PO per speech pathology when he is able to cooperate with swallow eval Protonix for SUP.  HEMATOLOGIC A: Leucocytosis P:  Transfusion only if HB <7.  INFECTIOUS A:  Septic shock from ischemic bowel. All cultures are negative to date.  Leucocytosis -trending down P:   Abx: Ceftaz 8/4>> Vanco 8/5>> 8/6 Flagyl 8/4>> 8/11 Diflucan 8/4>>8/11  Ct Abx x 14 ds, rpt CT shows no abscess. Right foot ulcer - wound care follow up  ENDOCRINE A:  Hyperglycemia without h/o DM P:  SSI   NEUROLOGIC A: Pain, Anxiety  deconditioning P:   Fent intermittently, has not needed any. PT following. Wife indicates that pt was not taking xanax on schedule at home, that the med was causing confusion. I will stop this today, follow his MS  FAMILY  - Updates: no family bedside.  GLOBAL:  Goal to SDU  soon as delirium improves. Will likely need to restart diuresis soon.      Levy Pupa, MD, PhD 06/30/2015, 10:27 AM [facility] [phone] or if no answer [phone]

## 2015-06-30 NOTE — Progress Notes (Signed)
Rehab Admissions Coordinator Note:  Patient was screened by Clois Dupes for appropriateness for an Inpatient Acute Rehab Consult per PT recommendation. Noted pt now intubated. Defer rehab venue planning until appropriate.Clois Dupes 06/30/2015, 7:05 PM  I can be reached at [phone].

## 2015-07-01 ENCOUNTER — Inpatient Hospital Stay (HOSPITAL_COMMUNITY): Payer: Medicare Other

## 2015-07-01 ENCOUNTER — Ambulatory Visit: Payer: Medicare Other | Admitting: Cardiothoracic Surgery

## 2015-07-01 LAB — BASIC METABOLIC PANEL
ANION GAP: 9 (ref 5–15)
BUN: 12 mg/dL (ref 6–20)
CALCIUM: 8.2 mg/dL — AB (ref 8.9–10.3)
CO2: 25 mmol/L (ref 22–32)
Chloride: 106 mmol/L (ref 101–111)
Creatinine, Ser: 0.56 mg/dL — ABNORMAL LOW (ref 0.61–1.24)
Glucose, Bld: 106 mg/dL — ABNORMAL HIGH (ref 65–99)
Potassium: 4.7 mmol/L (ref 3.5–5.1)
SODIUM: 140 mmol/L (ref 135–145)

## 2015-07-01 LAB — GLUCOSE, CAPILLARY
GLUCOSE-CAPILLARY: 108 mg/dL — AB (ref 65–99)
GLUCOSE-CAPILLARY: 107 mg/dL — AB (ref 65–99)
Glucose-Capillary: 96 mg/dL (ref 65–99)
Glucose-Capillary: 105 mg/dL — ABNORMAL HIGH (ref 65–99)
Glucose-Capillary: 108 mg/dL — ABNORMAL HIGH (ref 65–99)
Glucose-Capillary: 136 mg/dL — ABNORMAL HIGH (ref 65–99)

## 2015-07-01 LAB — CBC
HEMATOCRIT: 39.5 % (ref 39.0–52.0)
Hemoglobin: 12.5 g/dL — ABNORMAL LOW (ref 13.0–17.0)
MCH: 28.4 pg (ref 26.0–34.0)
MCHC: 31.6 g/dL (ref 30.0–36.0)
MCV: 89.8 fL (ref 78.0–100.0)
Platelets: 318 10*3/uL (ref 150–400)
RBC: 4.4 MIL/uL (ref 4.22–5.81)
RDW: 16.1 % — ABNORMAL HIGH (ref 11.5–15.5)
WBC: 10.2 10*3/uL (ref 4.0–10.5)

## 2015-07-01 LAB — PROTIME-INR
INR: 1.4 (ref 0.00–1.49)
Prothrombin Time: 17.3 seconds — ABNORMAL HIGH (ref 11.6–15.2)

## 2015-07-01 LAB — APTT: APTT: 26 s (ref 24–37)

## 2015-07-01 MED ORDER — VITAL HIGH PROTEIN PO LIQD: 1000.0000 mL | ORAL | Status: DC

## 2015-07-01 MED ORDER — PRO-STAT SUGAR FREE PO LIQD
30.0000 mL | Freq: Three times a day (TID) | ORAL | Status: DC
  Administered 2015-07-01 – 2015-07-08 (×12): 30 mL
  Filled 2015-07-01 (×23): qty 30

## 2015-07-01 MED ORDER — FUROSEMIDE 10 MG/ML IJ SOLN
40.0000 mg | Freq: Two times a day (BID) | INTRAMUSCULAR | Status: DC
  Administered 2015-07-01 – 2015-07-06 (×11): 40 mg via INTRAVENOUS
  Filled 2015-07-01 (×17): qty 4

## 2015-07-01 MED ORDER — VITAL HIGH PROTEIN PO LIQD
1000.0000 mL | ORAL | Status: DC
  Administered 2015-07-01 – 2015-07-04 (×3): 1000 mL
  Filled 2015-07-01 (×9): qty 1000

## 2015-07-01 NOTE — Significant Event (Signed)
Patient has order for PICC placed by IV team. RN called IV team-who stated that they do not have a PICC RN today, therefore is unable to place one today-stated possible tomorrow instead.

## 2015-07-01 NOTE — Progress Notes (Signed)
PULMONARY / CRITICAL CARE MEDICINE   Name: Jonathan Le MRN: [id number] DOB: 08-22-1946    ADMISSION DATE:  06/18/2015 CONSULTATION DATE: 06/18/15   CHIEF COMPLAINT:  Septic shock secondary to ischemic colon. Acute respiratiory failure. Acute renal failure.  INITIAL PRESENTATION: 69 year old male with PMH of OSA and HTN. 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 Post discharge course complicated by R leg cellulitis after endoscopic vein harvest he was seen by Dr. Donata Clay in clinic for this,was having some constipation. 8/4 PM he took an enema and was straining in bathroom. He became diaphoretic and had loss of consciousness. In ED he was noted to be profoundly hypotensive with SOB. Found to be in severe septic shock with ischemic colon s/p total abd colectomy on 8/4  STUDIES:  Echo (8/4) estimated ejection fraction was in the range of 25% to 30%. Severe hypokinesis of the anteroseptal and anterior myocardium.Dyskinesis of the apical myocardium. .  CT chest abd pelvis (8/4) CTA CHEST: Limited CTA of the chest, poor bolus timing. Moderate to severe cardiomegaly with small pericardial effusion.Evidence of pulmonary edema with small pleural effusions and, patchy airspace opacities most consistent with confluent edema.  CT ABDOMEN AND PELVIS: Focal colonic hepatic flexure pneumatosis without bowel perforation. Moderate to large amount of distal retained large bowel stool. Mild probable ileus, no bowel obstruction.  Bilateral nonobstructing nephrolithiasis measuring up to 4 mm.  CT abdomen 8/12 >> no evidence intra-abd abscess, ascites present, GB sludge, small B pleural effusions.   SIGNIFICANT EVENTS: 8/4- Total abdominal colectomy with abdominal vac placement 8/6 Exploratory laparotomy, removal of  prior packs, closure of open abdomen, ileostomy 8/7 high pressors 8/8 self extubated 8/9  resp distress , required bipap , lasix 8/13 had to restart levophed for shock overnight. 8/16 reintubated, primarily for MS, agitation, tachypnea  SUBJECTIVE:     VITAL SIGNS: Temp:  [97.9 F (36.6 C)-99.8 F (37.7 C)] 99.8 F (37.7 C) (08/17 0810) Pulse Rate:  [74-97] 84 (08/17 0900) Resp:  [11-42] 26 (08/17 0900) BP: (92-131)/(38-87) 108/54 mmHg (08/17 0900) SpO2:  [89 %-100 %] 100 % (08/17 0900) FiO2 (%):  [40 %-100 %] 40 % (08/17 0900) Weight:  [138.4 kg (305 lb 1.9 oz)] 138.4 kg (305 lb 1.9 oz) (08/17 0500)   HEMODYNAMICS:    VENTILATOR SETTINGS: Vent Mode:  [-] PRVC FiO2 (%):  [40 %-100 %] 40 % Set Rate:  [16 bmp] 16 bmp Vt Set:  [570 mL] 570 mL PEEP:  [10 cmH20] 10 cmH20 Plateau Pressure:  [12 cmH20-21 cmH20] 21 cmH20  INTAKE / OUTPUT:  Intake/Output Summary (Last 24 hours) at 07/01/15 1039 Last data filed at 07/01/15 0800  Gross per 24 hour  Intake 981.65 ml  Output   2425 ml  Net -1443.35 ml   PHYSICAL EXAMINATION: General: no resp distress, comfortable Neuro:awake, interactive, non focal Head: [place]/AT EENT: PERRL, no JVD noted Cardiovascular: Regular Rate and rhythm. No MRG Lungs: Decreased breath sounds antr Abdomen: Soft, non-tender, surgical dressing in place Ext: 2+ edema  LABS:  CBC  Recent Labs Lab 06/28/15 0520 06/29/15 0610 07/01/15 0441  WBC 19.3* 13.0* 10.2  HGB 12.2* 12.2* 12.5*  HCT 37.6* 38.5* 39.5  PLT 241 283 318   Coag's  Recent Labs Lab 06/29/15 0610 06/30/15 0720 07/01/15 0314  APTT 30  31 26  INR 1.31 1.30 1.40   BMET  Recent Labs Lab 06/28/15 0520 06/29/15 0610 07/01/15 0314  NA 140 141  139 140  K 3.9 3.8  3.7 4.7  CL 104 104  102 106  CO2 29 32  30 25  BUN 17 14  14 12   CREATININE 0.51* 0.55*  0.53* 0.56*  GLUCOSE 121* 126*  126* 106*   Electrolytes  Recent Labs Lab 06/26/15 0415  06/28/15 0520  06/29/15 0610 07/01/15 0314  CALCIUM 8.4*  < > 8.3* 8.4*  8.3* 8.2*  MG 1.8  --  1.8 1.9  1.9  --   PHOS 3.3  --  3.1 3.0  3.0  --   < > = values in this interval not displayed. Sepsis Markers No results for input(s): LATICACIDVEN, PROCALCITON, O2SATVEN in the last 168 hours. ABG  Recent Labs Lab 06/28/15 1555 06/30/15 1640 06/30/15 1840  PHART 7.437 7.472* 7.413  PCO2ART 42.9 37.1 49.2*  PO2ART 76.8* 63.0* 337.0*   Liver Enzymes  Recent Labs Lab 06/25/15 0430 06/29/15 0610  AST 21 29  ALT 14* 16*  ALKPHOS 75 85  BILITOT 1.0 1.2  ALBUMIN 1.1* 1.3*   Cardiac Enzymes No results for input(s): TROPONINI, PROBNP in the last 168 hours. Glucose  Recent Labs Lab 06/30/15 1626 06/30/15 1751 06/30/15 1921 06/30/15 2354 07/01/15 0358 07/01/15 0808  GLUCAP 129* 114* 115* 101* 96 108*   Imaging Dg Chest Port 1 View  07/01/2015   CLINICAL DATA:  69 year old male with ventilator dependent respiratory failure. Initial encounter.  EXAM: PORTABLE CHEST - 1 VIEW  COMPARISON:  06/30/2015 and earlier.  FINDINGS: Portable AP semi upright view at 0557 hours. Stable endotracheal tube position. Enteric tube courses to the abdomen, tip not included. Stable left IJ approach central line.  Lung volumes have not significantly changed. Veiling confluent opacity at both lung base compatible with pleural effusions and lower lobe collapse. No pneumothorax. Stable pulmonary vascularity without overt edema. Overall ventilation is mildly decreased. Stable cardiac size and mediastinal contours. Sequelae of CABG.  IMPRESSION: 1.  Stable lines and tubes. 2. Bilateral pleural effusions and lower lobe atelectasis, ventilation has mildly decreased since yesterday.   Electronically Signed   By: Odessa Fleming M.D.   On: 07/01/2015 07:48   Dg Chest Port 1 View  06/30/2015   CLINICAL DATA:  Acute respiratory failure requiring intubation.  EXAM: PORTABLE CHEST - 1 VIEW  COMPARISON:  06/24/2015 and earlier.   FINDINGS: Endotracheal tube tip projects approximately 3 cm above the carina. Left jugular central venous catheter tip projects at the junction of the innominate vein and SVC. Since examination 6 days ago, improved aeration in the lung bases, though atelectasis persists, left greater than right. No new pulmonary parenchymal abnormalities.  IMPRESSION: 1. Endotracheal tube tip in satisfactory position projecting approximately 3 cm above the carina. 2. Left jugular venous catheter tip projects at the junction of the innominate vein and SVC, unchanged. 3. Improved aeration in the lung bases since 06/24/2015, with bibasilar atelectasis persisting, left greater than right. 4. No new abnormalities.   Electronically Signed   By: Hulan Saas M.D.   On: 06/30/2015 17:15   ASSESSMENT / PLAN:  PULMONARY ETT 8/4 >>8/8 ETT 8/16 >>  A: Acute resp failure, post -op OSA Episode of resp distress 8/9 - ? Fluid overload vs aspiration Reintubated 8/16 largely for altered MS, agitation, then hypersomnolence with sedation P:   Push for PSV / SBT's as overall  pulm status appears to be stable ? Whether he will need trach to get through this illness, allow nocturnal ventilation, resolution of his delirium  Will need nocturnal CPAP once extubated if we do not move to trach  CARDIOVASCULAR A: Septic shock on pressors Ischemic cardiomyopathy -H/O CABG on 7/16 P:  Norepi off since am 8/16 Diuresis restarted 8/16, will continue at lower dose 8/17  RENAL A:  AKI -resolved Hypomag P:   Replete lytes as needed. Follow BMP   GASTROINTESTINAL A:  s/p total colectomy for ischemic bowel S/p  ileostomy  P:   D/c'd TPN,  Using GT for TF's Protonix for SUP.  HEMATOLOGIC A: Leucocytosis P:  Transfusion only if HB <7.  INFECTIOUS A:  Septic shock from ischemic bowel. All cultures are negative to date.  Leucocytosis -trending down P:   Abx: Ceftaz 8/4>> Vanco 8/5>> 8/6 Flagyl 8/4>> 8/11 Diflucan  8/4>>8/11  Ct Abx x 14 ds, rpt CT shows no abscess. Plan d/c on 8/18 Right foot ulcer - wound care follow up  ENDOCRINE A:  Hyperglycemia without h/o DM P:  SSI   NEUROLOGIC A: Pain, Anxiety  deconditioning P:   Propofol + fentanyl prn  FAMILY  - Updates: Reviewed status with the pt's sister and brother-in-law on 8/17. I mentioned to them the contributors to his resp failure. I have asked them to consider the benefits of tracheostomy to allow [place] to make more steady progress. They are going to mention this to his wife, then I will discuss with her on 8/18  Independent CC time 35 minutes.      Levy Pupa, MD, PhD 07/01/2015, 10:39 AM [facility] [phone] or if no answer [phone]

## 2015-07-01 NOTE — Progress Notes (Signed)
SLP Cancellation Note  Patient Details Name: Jonathan Le MRN: [id number] DOB: Oct 30, 1946   Cancelled treatment:       Reason Eval/Treat Not Completed: Medical issues which prohibited therapy. Pt intubated. Will sign off and await new orders.    Kezia Benevides, Riley Nearing 07/01/2015, 7:51 AM

## 2015-07-01 NOTE — Consult Note (Signed)
WOC ostomy follow up Stoma type/location: RLQ, end ileostomy   Output brown/green liquid Ostomy pouching: 2pc. In place Education provided: none, not appropriate at this time. Pt re-intubated yesterday for respiratory failure, patient sedated.  No family at the bedside Bedside nursing has been changing ostomy pouch as needed. Enrolled patient in [place] Secure Start Discharge program: No  Midline surgical wound CCS added NPWT VAC dressing yesterday, for T/Th/Sat dressing changes.  Will assess foot wound weekly, see note from Monday.  Assessed area on the right buttock today, sDTI small area on the buttock, does not blanch.  Will have staff add silicone foam if soilage not constant issue from stool.   Nare wound unchanged.  WOC team will follow along for weekly assessments of nare, foot, and buttock wound.  Bedside nurse ok to change surgical VAC dressing. Will follow up for ostomy teaching with patient and/or family when appropriate.   Jonathan Le [place] RN,CWOCN [phone]

## 2015-07-01 NOTE — Progress Notes (Signed)
Patient ID: Jonathan Le, male   DOB: 1946/01/10, 69 y.o.   MRN: [id number] 11 Days Post-Op  Subjective: Events of last night's intubation noted.    Objective: Vital signs in last 24 hours: Temp:  [97.9 F (36.6 C)-99.1 F (37.3 C)] 99.1 F (37.3 C) (08/17 0402) Pulse Rate:  [74-97] 76 (08/17 0732) Resp:  [11-42] 16 (08/17 0732) BP: (91-131)/(36-87) 110/54 mmHg (08/17 0732) SpO2:  [89 %-100 %] 100 % (08/17 0732) FiO2 (%):  [40 %-100 %] 40 % (08/17 0732) Weight:  [138.4 kg (305 lb 1.9 oz)] 138.4 kg (305 lb 1.9 oz) (08/17 0500) Last BM Date: 06/30/15  Intake/Output from previous day: 08/16 0701 - 08/17 0700 In: 1135 [P.O.:360; I.V.:485; NG/GT:140; IV Piggyback:150] Out: 2615 [Urine:1940; Emesis/NG output:150; Drains:50; Stool:475] Intake/Output this shift:    PE: Abd: soft, +BS, wound VAC in place, ileostomy working well with good enteric output  Lab Results:   Recent Labs  06/29/15 0610 07/01/15 0441  WBC 13.0* 10.2  HGB 12.2* 12.5*  HCT 38.5* 39.5  PLT 283 318   BMET  Recent Labs  06/29/15 0610 07/01/15 0314  NA 141  139 140  K 3.8  3.7 4.7  CL 104  102 106  CO2 32  30 25  GLUCOSE 126*  126* 106*  BUN CREATININE 0.55*  0.53* 0.56*  CALCIUM 8.4*  8.3* 8.2*   PT/INR  Recent Labs  06/30/15 0720 07/01/15 0314  LABPROT 16.3* 17.3*  INR 1.30 1.40   CMP     Component Value Date/Time   NA 140 07/01/2015 0314   K 4.7 07/01/2015 0314   CL 106 07/01/2015 0314   CO2 25 07/01/2015 0314   GLUCOSE 106* 07/01/2015 0314   BUN 12 07/01/2015 0314   CREATININE 0.56* 07/01/2015 0314   CALCIUM 8.2* 07/01/2015 0314   PROT 5.1* 06/29/2015 0610   ALBUMIN 1.3* 06/29/2015 0610   AST 29 06/29/2015 0610   ALT 16* 06/29/2015 0610   ALKPHOS 85 06/29/2015 0610   BILITOT 1.2 06/29/2015 0610   GFRNONAA >60 07/01/2015 0314   GFRAA >60 07/01/2015 0314   Lipase     Component Value Date/Time   LIPASE 15* 05/19/2015 2140        Studies/Results: Dg Chest Port 1 View  07/01/2015   CLINICAL DATA:  69 year old male with ventilator dependent respiratory failure. Initial encounter.  EXAM: PORTABLE CHEST - 1 VIEW  COMPARISON:  06/30/2015 and earlier.  FINDINGS: Portable AP semi upright view at 0557 hours. Stable endotracheal tube position. Enteric tube courses to the abdomen, tip not included. Stable left IJ approach central line.  Lung volumes have not significantly changed. Veiling confluent opacity at both lung base compatible with pleural effusions and lower lobe collapse. No pneumothorax. Stable pulmonary vascularity without overt edema. Overall ventilation is mildly decreased. Stable cardiac size and mediastinal contours. Sequelae of CABG.  IMPRESSION: 1.  Stable lines and tubes. 2. Bilateral pleural effusions and lower lobe atelectasis, ventilation has mildly decreased since yesterday.   Electronically Signed   By: Odessa Fleming M.D.   On: 07/01/2015 07:48   Dg Chest Port 1 View  06/30/2015   CLINICAL DATA:  Acute respiratory failure requiring intubation.  EXAM: PORTABLE CHEST - 1 VIEW  COMPARISON:  06/24/2015 and earlier.  FINDINGS: Endotracheal tube tip projects approximately 3 cm above the carina. Left jugular central venous catheter tip projects at the junction of the innominate vein and SVC. Since examination 6 days  ago, improved aeration in the lung bases, though atelectasis persists, left greater than right. No new pulmonary parenchymal abnormalities.  IMPRESSION: 1. Endotracheal tube tip in satisfactory position projecting approximately 3 cm above the carina. 2. Left jugular venous catheter tip projects at the junction of the innominate vein and SVC, unchanged. 3. Improved aeration in the lung bases since 06/24/2015, with bibasilar atelectasis persisting, left greater than right. 4. No new abnormalities.   Electronically Signed   By: Hulan Saas M.D.   On: 06/30/2015 17:15    Anti-infectives: Anti-infectives     Start     Dose/Rate Route Frequency Ordered Stop   06/20/15 2200  cefTAZidime (FORTAZ) 2 g in dextrose 5 % 50 mL IVPB     2 g 100 mL/hr over 30 Minutes Intravenous 3 times per day 06/20/15 1537     06/20/15 0200  vancomycin (VANCOCIN) IVPB 1000 mg/200 mL premix  Status:  Discontinued     1,000 mg 200 mL/hr over 60 Minutes Intravenous Every 12 hours 06/19/15 1512 06/20/15 0737   06/19/15 1200  vancomycin (VANCOCIN) 1,750 mg in sodium chloride 0.9 % 500 mL IVPB  Status:  Discontinued     1,750 mg 250 mL/hr over 120 Minutes Intravenous Every 24 hours 06/18/15 1030 06/19/15 1512   06/18/15 1200  fluconazole (DIFLUCAN) IVPB 400 mg  Status:  Discontinued     400 mg 100 mL/hr over 120 Minutes Intravenous Every 24 hours 06/18/15 1050 06/25/15 0925   06/18/15 1130  vancomycin (VANCOCIN) 2,500 mg in sodium chloride 0.9 % 500 mL IVPB     2,500 mg 250 mL/hr over 120 Minutes Intravenous  Once 06/18/15 1030 06/18/15 1314   06/18/15 1130  cefTAZidime (FORTAZ) 2 g in dextrose 5 % 50 mL IVPB  Status:  Discontinued     2 g 100 mL/hr over 30 Minutes Intravenous Every 12 hours 06/18/15 1030 06/20/15 1537   06/18/15 1030  metroNIDAZOLE (FLAGYL) IVPB 500 mg  Status:  Discontinued     500 mg 100 mL/hr over 60 Minutes Intravenous Every 8 hours 06/18/15 1000 06/25/15 0925       Assessment/Plan POD 13,11, s/p ex lap with subtotal colectomy, open abdomen, take back with creation of ileostomy and closure -intubated overnight. -patient is surgically stable, can start TFs through OG when ok with CCM.  They can be advanced per protocol Leukocytosis -WBC 10K  DVT prophylaxis -SCDs/Heparin     LOS: 13 days    Nazire Fruth E 07/01/2015, 7:50 AM Pager: [phone]

## 2015-07-01 NOTE — Progress Notes (Addendum)
Nutrition Consult/Follow Up  DOCUMENTATION CODES:   Morbid obesity  INTERVENTION:   Initiate Vital High Protein at 20 ml/hr and increase by 10 ml every 4 hours to goal rate of 50 ml/hr    Prostat liquid protein 30 ml TID  Total TF regimen + current Propofol infusion to provide 1941 kcals, 150 gm protein, 1003 ml of free water  NUTRITION DIAGNOSIS:   Inadequate oral intake now related to inability to eat as evidenced by NPO status, ongoing  GOAL:   Provide needs based on ASPEN/SCCM guidelines, currently unmet  MONITOR:   TF tolerance, Vent status, Weight trends, Labs, I & O's  ASSESSMENT:   [name] s/p CABG 7/8 presented to ED 8/4 after losing consciousness on toilet. In ED he was hypoxic requiring NRB and hypotensive. PCCM called for admission.   Patient s/p procedure 8/4: TOTAL ABDOMINAL COLECTOMY ABDOMINAL VAC PLACEMENT  Patient is currently on ventilator support -- OGT in place MV: 9.1 L/min Temp (24hrs), Avg:98.6 F (37 C), Min:97.9 F (36.6 C), Max:99.8 F (37.7 C)   Propofol: 16.7 ml/hr ----> 441 fat kcal  TPN discontinued 8/15.  S/p FEES 8/12.  Dx with severe pharyngeal phase dysphagia.  Prior to re-intubation, pt on Dys 3-pudding thick liquid diet with poor PO intake.    Vital High Protein formula ordered per Adult Tube Feeding Protocol.  RD consulted for TF management.  Will adjust orders.  Diet Order:  Diet NPO time specified  Skin:   Abdominal wound VAC Stage III R foot wound Deep tissue injury to nose  Stage I wound to coccyx  Last BM:  8/16  Height:   Ht Readings from Last 1 Encounters:  06/30/15  (1.753 m)    Weight:   Wt Readings from Last 1 Encounters:  07/01/15 305 lb 1.9 oz (138.4 kg)    Ideal Body Weight:  72.7 kg  BMI:  Body mass index is 45.04 kg/(m^2).  Estimated Nutritional Needs:   Kcal:  1610-9604  Protein:  150-160 gm  Fluid:  per MD  EDUCATION NEEDS:   No education needs identified at this  time  Maureen Chatters, RD, LDN Pager #: [phone] After-Hours Pager #: [phone]

## 2015-07-01 NOTE — Progress Notes (Signed)
ANTIBIOTIC CONSULT NOTE - FOLLOW UP  Pharmacy Consult for Jonathan Le Indication: Sepsis  Allergies  Allergen Reactions  . Levaquin [Levofloxacin] Nausea Only  . Penicillins Hives  . Doxycycline Rash    Patient Measurements: Height:  (175.3 cm) Weight: (!) 305 lb 1.9 oz (138.4 kg) IBW/kg (Calculated) : 70.7 Adjusted Body Weight:   Vital Signs: Temp: 97.9 F (36.6 C) (08/17 1132) Temp Source: Axillary (08/17 1132) BP: 105/47 mmHg (08/17 1100) Pulse Rate: 77 (08/17 1140) Intake/Output from previous day: 08/16 0701 - 08/17 0700 In: 1135 [P.O.:360; I.V.:485; NG/GT:140; IV Piggyback:150] Out: 2615 [Urine:1940; Emesis/NG output:150; Drains:50; Stool:475] Intake/Output from this shift: Total I/O In: 163.5 [I.V.:133.5; NG/GT:30] Out: 150 [Urine:150]  Labs:  Recent Labs  06/29/15 0610 07/01/15 0314 07/01/15 0441  WBC 13.0*  --  10.2  HGB 12.2*  --  12.5*  PLT 283  --  318  CREATININE 0.55*  0.53* 0.56*  --    Estimated Creatinine Clearance: 120.6 mL/min (by C-G formula based on Cr of 0.56). No results for input(s): VANCOTROUGH, VANCOPEAK, VANCORANDOM, GENTTROUGH, GENTPEAK, GENTRANDOM, TOBRATROUGH, TOBRAPEAK, TOBRARND, AMIKACINPEAK, AMIKACINTROU, AMIKACIN in the last 72 hours.   Assessment: 69 yo male with recent CABG (7/8) and cellulitis readmitted 8/4 with septic shock (per MD- likely from an intra-abdominal source due to either ischemic or infectious colitis).    Anticoagulation: INR elevated w/o coumadin (1.78 on admit), d/t liver issue?Marland Kitchen No AC PTA. On SQ heparin>>sq lovenox 30/day. CBC WNL. INR 1.4. MD sticky note recommending increasing LMWH to 0.5mg /kg/day for VTE prophx.  Infectious Disease: Septic shock from ischemic bowel.  WBC 19.3>>10.2 Tmax 99.8. Noted on clindamycin PTA. Plan 14d abx. (?goal end date of fortaz 8/18)  8/4 fortaz>> 8/4 vanco>> 8/6 8/4 flagyl>>8/11 8/4 fluconazole >>8/11  8/4 blood x2>>neg 8/5 c-diff neg  Goal of Therapy:  Eradication  of infection  Plan:  -Continue Fortaz 2gm IV q8h  -F/U clinical status, LOT--8/18 will be 14 d.   Jonathan Le, PharmD, BCPS Clinical Staff Pharmacist Pager [phone] r Misty Stanley Stillinger 07/01/2015,12:40 PM

## 2015-07-02 ENCOUNTER — Inpatient Hospital Stay (HOSPITAL_COMMUNITY): Payer: Medicare Other

## 2015-07-02 ENCOUNTER — Ambulatory Visit: Payer: Self-pay | Admitting: Otolaryngology

## 2015-07-02 LAB — BASIC METABOLIC PANEL
Anion gap: 8 (ref 5–15)
BUN: 18 mg/dL (ref 6–20)
CHLORIDE: 104 mmol/L (ref 101–111)
CO2: 30 mmol/L (ref 22–32)
Calcium: 8.3 mg/dL — ABNORMAL LOW (ref 8.9–10.3)
Creatinine, Ser: 0.72 mg/dL (ref 0.61–1.24)
GFR calc Af Amer: >60 mL/min (ref >60)
GFR calc non Af Amer: >60 mL/min (ref >60)
GLUCOSE: 130 mg/dL — AB (ref 65–99)
POTASSIUM: 3.6 mmol/L (ref 3.5–5.1)
Sodium: 142 mmol/L (ref 135–145)

## 2015-07-02 LAB — GLUCOSE, CAPILLARY
GLUCOSE-CAPILLARY: 126 mg/dL — AB (ref 65–99)
GLUCOSE-CAPILLARY: 127 mg/dL — AB (ref 65–99)
GLUCOSE-CAPILLARY: 136 mg/dL — AB (ref 65–99)
Glucose-Capillary: 117 mg/dL — ABNORMAL HIGH (ref 65–99)
Glucose-Capillary: 131 mg/dL — ABNORMAL HIGH (ref 65–99)
Glucose-Capillary: 109 mg/dL — ABNORMAL HIGH (ref 65–99)

## 2015-07-02 LAB — CBC
HEMATOCRIT: 35.2 % — AB (ref 39.0–52.0)
Hemoglobin: 11.2 g/dL — ABNORMAL LOW (ref 13.0–17.0)
MCH: 28.2 pg (ref 26.0–34.0)
MCHC: 31.8 g/dL (ref 30.0–36.0)
MCV: 88.7 fL (ref 78.0–100.0)
Platelets: 357 10*3/uL (ref 150–400)
RBC: 3.97 MIL/uL — ABNORMAL LOW (ref 4.22–5.81)
RDW: 16 % — AB (ref 11.5–15.5)
WBC: 12.3 10*3/uL — ABNORMAL HIGH (ref 4.0–10.5)

## 2015-07-02 LAB — APTT: APTT: 26 s (ref 24–37)

## 2015-07-02 LAB — CULTURE, RESPIRATORY W GRAM STAIN: Special Requests: NORMAL

## 2015-07-02 LAB — PROTIME-INR
INR: 1.39 (ref 0.00–1.49)
Prothrombin Time: 17.2 seconds — ABNORMAL HIGH (ref 11.6–15.2)

## 2015-07-02 MED ORDER — ANTISEPTIC ORAL RINSE SOLUTION (CORINZ)
7.0000 mL | OROMUCOSAL | Status: DC
  Administered 2015-07-02 – 2015-07-08 (×46): 7 mL via OROMUCOSAL

## 2015-07-02 MED ORDER — POTASSIUM CHLORIDE 20 MEQ/15ML (10%) PO SOLN
40.0000 meq | Freq: Once | ORAL | Status: AC
  Administered 2015-07-02: 40 meq
  Filled 2015-07-02 (×2): qty 30

## 2015-07-02 MED ORDER — CHLORHEXIDINE GLUCONATE 0.12% ORAL RINSE (MEDLINE KIT)
15.0000 mL | Freq: Two times a day (BID) | OROMUCOSAL | Status: DC
  Administered 2015-07-02 – 2015-07-08 (×13): 15 mL via OROMUCOSAL

## 2015-07-02 MED ORDER — SODIUM CHLORIDE 0.9 % IJ SOLN
10.0000 mL | INTRAMUSCULAR | Status: DC | PRN
  Administered 2015-07-03: 10 mL
  Filled 2015-07-02: qty 40

## 2015-07-02 MED ORDER — SODIUM CHLORIDE 0.9 % IJ SOLN
10.0000 mL | Freq: Two times a day (BID) | INTRAMUSCULAR | Status: DC
Start: 2015-07-02 — End: 2015-07-03
  Administered 2015-07-02: 10 mL

## 2015-07-02 NOTE — Progress Notes (Signed)
PT Cancellation Note  Patient Details Name: Jonathan Le MRN: [id number] DOB: Aug 10, 1946   Cancelled Treatment:    Reason Eval/Treat Not Completed: Medical issues which prohibited therapy   Noted pt intubated 8/16. Pt briefly assessed and too sedated to participate with PT. Will follow.   Raimundo Corbit 07/02/2015, 12:07 PM  Pager [phone]

## 2015-07-02 NOTE — Consult Note (Signed)
Reason for Consult: Chronic ventilator dependence Referring Physician: Rakesh Alva V, MD  Jonathan Le is an 69 y.o. male.  HPI: History of septic shock and extensive critical care following ischemic colon. Tracheostomy requested.  Past Medical History  Diagnosis Date  . Anxiety   . Hypertension   . Arthritis   . Sleep apnea     wears CPAP nightly  . Glaucoma   . CHF (congestive heart failure)     MI, with open heart surgery x 30 sdays ago per pt. (06/26/15)    Past Surgical History  Procedure Laterality Date  . Hernia repair      right  . Eye surgery      bil cataract  . Wisdom tooth extraction    . Fasciotomy foot / toe Left     foot  . Cardiac catheterization N/A 05/20/2015    Procedure: Left Heart Cath and Coronary Angiography;  Surgeon: Christopher D McAlhany, MD;  Location: [facility];  Service: Cardiovascular;  Laterality: N/A;  . Coronary artery bypass graft N/A 05/22/2015    Procedure: CORONARY ARTERY BYPASS GRAFTING (CABG), ON PUMP, TIMES THREE, USING LEFT INTERNAL MAMMARY ARTERY, RIGHT GREATER SAPHENOUS VEIN HARVESTED ENDOSCOPICALLY;  Surgeon: Peter Van Trigt, MD;  Location: [facility] OR;  Service: Open Heart Surgery;  Laterality: N/A;  LIMA-LAD, SVG-OM, SVG-PD  . Tee without cardioversion N/A 05/22/2015    Procedure: TRANSESOPHAGEAL ECHOCARDIOGRAM (TEE);  Surgeon: Peter Van Trigt, MD;  Location: [facility] OR;  Service: Open Heart Surgery;  Laterality: N/A;  . Laparotomy N/A 06/18/2015    Procedure: EXPLORATORY LAPAROTOMY;  Surgeon: Matthew Wakefield, MD;  Location: [facility] OR;  Service: General;  Laterality: N/A;  . Laparotomy N/A 06/20/2015    Procedure: WASHOUT OF ABDOMEN, CLOSURE OF ABDOMINAL WALL WITH PLACEMENT OF WOUND VAC AND ILLEOSTOMY ;  Surgeon: Faera Byerly, MD;  Location: [facility] OR;  Service: General;  Laterality: N/A;    No family history on file.  Social History:  reports that he has never smoked. He has never used smokeless tobacco. He reports that he does not drink  alcohol or use illicit drugs.  Allergies:  Allergies  Allergen Reactions  . Levaquin [Levofloxacin] Nausea Only  . Penicillins Hives  . Doxycycline Rash    Medications: Reviewed  Results for orders placed or performed during the hospital encounter of 06/18/15 (from the past 48 hour(s))  Glucose, capillary     Status: Abnormal   Collection Time: 06/30/15  4:26 PM  Result Value Ref Range   Glucose-Capillary 129 (H) 65 - 99 mg/dL   Comment 1 Capillary Specimen    Comment 2 Notify RN   I-STAT 3, arterial blood gas (G3+)     Status: Abnormal   Collection Time: 06/30/15  4:40 PM  Result Value Ref Range   pH, Arterial 7.472 (H) 7.350 - 7.450   pCO2 arterial 37.1 35.0 - 45.0 mmHg   pO2, Arterial 63.0 (L) 80.0 - 100.0 mmHg   Bicarbonate 27.2 (H) 20.0 - 24.0 mEq/L   TCO2 28 0 - 100 mmol/L   O2 Saturation 93.0 %   Acid-Base Excess 3.0 (H) 0.0 - 2.0 mmol/L   Patient temperature 98.3 F    Collection site FEMORAL ARTERY    Sample type ARTERIAL   Culture, respiratory (NON-Expectorated)     Status: None   Collection Time: 06/30/15  5:45 PM  Result Value Ref Range   Specimen Description TRACHEAL ASPIRATE    Special Requests Normal    Gram Stain        FEW WBC PRESENT,BOTH PMN AND MONONUCLEAR RARE SQUAMOUS EPITHELIAL CELLS PRESENT RARE GRAM POSITIVE COCCI IN PAIRS Performed at [facility]    Culture      Non-Pathogenic Oropharyngeal-type Flora Isolated. Performed at [facility]    Report Status 07/02/2015 FINAL   Glucose, capillary     Status: Abnormal   Collection Time: 06/30/15  5:51 PM  Result Value Ref Range   Glucose-Capillary 114 (H) 65 - 99 mg/dL   Comment 1 Capillary Specimen   I-STAT 3, arterial blood gas (G3+)     Status: Abnormal   Collection Time: 06/30/15  6:40 PM  Result Value Ref Range   pH, Arterial 7.413 7.350 - 7.450   pCO2 arterial 49.2 (H) 35.0 - 45.0 mmHg   pO2, Arterial 337.0 (H) 80.0 - 100.0 mmHg   Bicarbonate 31.4 (H) 20.0 - 24.0 mEq/L    TCO2 33 0 - 100 mmol/L   O2 Saturation 100.0 %   Acid-Base Excess 6.0 (H) 0.0 - 2.0 mmol/L   Collection site RADIAL, ALLEN'S TEST ACCEPTABLE    Drawn by VENIPUNCTURE    Sample type ARTERIAL   Glucose, capillary     Status: Abnormal   Collection Time: 06/30/15  7:21 PM  Result Value Ref Range   Glucose-Capillary 115 (H) 65 - 99 mg/dL   Comment 1 Capillary Specimen    Comment 2 Notify RN    Comment 3 Document in Chart   Triglycerides     Status: None   Collection Time: 06/30/15  8:18 PM  Result Value Ref Range   Triglycerides 137 <150 mg/dL  Glucose, capillary     Status: Abnormal   Collection Time: 06/30/15 11:54 PM  Result Value Ref Range   Glucose-Capillary 101 (H) 65 - 99 mg/dL   Comment 1 Capillary Specimen    Comment 2 Notify RN    Comment 3 Document in Chart   Protime-INR     Status: Abnormal   Collection Time: 07/01/15  3:14 AM  Result Value Ref Range   Prothrombin Time 17.3 (H) 11.6 - 15.2 seconds   INR 1.40 0.00 - 1.49  APTT     Status: None   Collection Time: 07/01/15  3:14 AM  Result Value Ref Range   aPTT 26 24 - 37 seconds  Basic metabolic panel     Status: Abnormal   Collection Time: 07/01/15  3:14 AM  Result Value Ref Range   Sodium 140 135 - 145 mmol/L   Potassium 4.7 3.5 - 5.1 mmol/L    Comment: DELTA CHECK NOTED NO VISIBLE HEMOLYSIS    Chloride 106 101 - 111 mmol/L   CO2 25 22 - 32 mmol/L   Glucose, Bld 106 (H) 65 - 99 mg/dL   BUN 12 6 - 20 mg/dL   Creatinine, Ser 0.56 (L) 0.61 - 1.24 mg/dL   Calcium 8.2 (L) 8.9 - 10.3 mg/dL   GFR calc non Af Amer >60 >60 mL/min   GFR calc Af Amer >60 >60 mL/min    Comment: (NOTE) The eGFR has been calculated using the CKD EPI equation. This calculation has not been validated in all clinical situations. eGFR's persistently <60 mL/min signify possible Chronic Kidney Disease.    Anion gap 9 5 - 15  Glucose, capillary     Status: None   Collection Time: 07/01/15  3:58 AM  Result Value Ref Range    Glucose-Capillary 96 65 - 99 mg/dL   Comment 1 Capillary Specimen    Comment   2 Notify RN    Comment 3 Document in Chart   CBC     Status: Abnormal   Collection Time: 07/01/15  4:41 AM  Result Value Ref Range   WBC 10.2 4.0 - 10.5 K/uL   RBC 4.40 4.22 - 5.81 MIL/uL   Hemoglobin 12.5 (L) 13.0 - 17.0 g/dL   HCT 39.5 39.0 - 52.0 %   MCV 89.8 78.0 - 100.0 fL   MCH 28.4 26.0 - 34.0 pg   MCHC 31.6 30.0 - 36.0 g/dL   RDW 16.1 (H) 11.5 - 15.5 %   Platelets 318 150 - 400 K/uL  Glucose, capillary     Status: Abnormal   Collection Time: 07/01/15  8:08 AM  Result Value Ref Range   Glucose-Capillary 108 (H) 65 - 99 mg/dL   Comment 1 Capillary Specimen    Comment 2 Notify RN   Glucose, capillary     Status: Abnormal   Collection Time: 07/01/15 11:29 AM  Result Value Ref Range   Glucose-Capillary 107 (H) 65 - 99 mg/dL   Comment 1 Capillary Specimen    Comment 2 Notify RN   Glucose, capillary     Status: Abnormal   Collection Time: 07/01/15  4:35 PM  Result Value Ref Range   Glucose-Capillary 105 (H) 65 - 99 mg/dL   Comment 1 Capillary Specimen    Comment 2 Notify RN   Glucose, capillary     Status: Abnormal   Collection Time: 07/01/15  7:21 PM  Result Value Ref Range   Glucose-Capillary 108 (H) 65 - 99 mg/dL   Comment 1 Capillary Specimen    Comment 2 Notify RN    Comment 3 Document in Chart   Glucose, capillary     Status: Abnormal   Collection Time: 07/01/15 11:20 PM  Result Value Ref Range   Glucose-Capillary 136 (H) 65 - 99 mg/dL   Comment 1 Capillary Specimen    Comment 2 Notify RN    Comment 3 Document in Chart   Glucose, capillary     Status: Abnormal   Collection Time: 07/02/15  3:53 AM  Result Value Ref Range   Glucose-Capillary 117 (H) 65 - 99 mg/dL   Comment 1 Capillary Specimen    Comment 2 Notify RN    Comment 3 Document in Chart   Protime-INR     Status: Abnormal   Collection Time: 07/02/15  5:30 AM  Result Value Ref Range   Prothrombin Time 17.2 (H) 11.6 -  15.2 seconds   INR 1.39 0.00 - 1.49  APTT     Status: None   Collection Time: 07/02/15  5:30 AM  Result Value Ref Range   aPTT 26 24 - 37 seconds  Basic metabolic panel     Status: Abnormal   Collection Time: 07/02/15  5:30 AM  Result Value Ref Range   Sodium 142 135 - 145 mmol/L   Potassium 3.6 3.5 - 5.1 mmol/L    Comment: DELTA CHECK NOTED   Chloride 104 101 - 111 mmol/L   CO2 30 22 - 32 mmol/L   Glucose, Bld 130 (H) 65 - 99 mg/dL   BUN 18 6 - 20 mg/dL   Creatinine, Ser 0.72 0.61 - 1.24 mg/dL   Calcium 8.3 (L) 8.9 - 10.3 mg/dL   GFR calc non Af Amer >60 >60 mL/min   GFR calc Af Amer >60 >60 mL/min    Comment: (NOTE) The eGFR has been calculated using the CKD EPI equation. This   calculation has not been validated in all clinical situations. eGFR's persistently <60 mL/min signify possible Chronic Kidney Disease.    Anion gap 8 5 - 15  CBC     Status: Abnormal   Collection Time: 07/02/15  5:30 AM  Result Value Ref Range   WBC 12.3 (H) 4.0 - 10.5 K/uL   RBC 3.97 (L) 4.22 - 5.81 MIL/uL   Hemoglobin 11.2 (L) 13.0 - 17.0 g/dL   HCT 35.2 (L) 39.0 - 52.0 %   MCV 88.7 78.0 - 100.0 fL   MCH 28.2 26.0 - 34.0 pg   MCHC 31.8 30.0 - 36.0 g/dL   RDW 16.0 (H) 11.5 - 15.5 %   Platelets 357 150 - 400 K/uL  Glucose, capillary     Status: Abnormal   Collection Time: 07/02/15  8:47 AM  Result Value Ref Range   Glucose-Capillary 136 (H) 65 - 99 mg/dL   Comment 1 Notify RN   Glucose, capillary     Status: Abnormal   Collection Time: 07/02/15 12:42 PM  Result Value Ref Range   Glucose-Capillary 131 (H) 65 - 99 mg/dL   Comment 1 Notify RN     Dg Chest Port 1 View  07/02/2015   CLINICAL DATA:  Respiratory failure.  EXAM: PORTABLE CHEST - 1 VIEW  COMPARISON:  07/01/2015 .  FINDINGS: Endotracheal tube, left IJ line, NG tube in stable position. Mediastinum and hilar structures are normal. Prior CABG. Stable cardiomegaly. Interim partial clearing of bibasilar atelectasis and/or infiltrates.  Small left pleural effusion. No pneumothorax.  IMPRESSION: 1. Lines and tubes in stable position. 2. Interim partial clearing of bibasilar atelectasis and/or infiltrates. Small left pleural effusion. 3. Stable cardiomegaly.  Prior CABG.   Electronically Signed   By: Thomas  Register   On: 07/02/2015 07:20   Dg Chest Port 1 View  07/01/2015   CLINICAL DATA:  68-year-old male with ventilator dependent respiratory failure. Initial encounter.  EXAM: PORTABLE CHEST - 1 VIEW  COMPARISON:  06/30/2015 and earlier.  FINDINGS: Portable AP semi upright view at 0557 hours. Stable endotracheal tube position. Enteric tube courses to the abdomen, tip not included. Stable left IJ approach central line.  Lung volumes have not significantly changed. Veiling confluent opacity at both lung base compatible with pleural effusions and lower lobe collapse. No pneumothorax. Stable pulmonary vascularity without overt edema. Overall ventilation is mildly decreased. Stable cardiac size and mediastinal contours. Sequelae of CABG.  IMPRESSION: 1.  Stable lines and tubes. 2. Bilateral pleural effusions and lower lobe atelectasis, ventilation has mildly decreased since yesterday.   Electronically Signed   By: H  Hall M.D.   On: 07/01/2015 07:48   Dg Chest Port 1 View  06/30/2015   CLINICAL DATA:  Acute respiratory failure requiring intubation.  EXAM: PORTABLE CHEST - 1 VIEW  COMPARISON:  06/24/2015 and earlier.  FINDINGS: Endotracheal tube tip projects approximately 3 cm above the carina. Left jugular central venous catheter tip projects at the junction of the innominate vein and SVC. Since examination 6 days ago, improved aeration in the lung bases, though atelectasis persists, left greater than right. No new pulmonary parenchymal abnormalities.  IMPRESSION: 1. Endotracheal tube tip in satisfactory position projecting approximately 3 cm above the carina. 2. Left jugular venous catheter tip projects at the junction of the innominate vein and  SVC, unchanged. 3. Improved aeration in the lung bases since 06/24/2015, with bibasilar atelectasis persisting, left greater than right. 4. No new abnormalities.   Electronically Signed     By: Thomas  Lawrence M.D.   On: 06/30/2015 17:15    ROS:Negative except as listed in admit H&P  Blood pressure 101/55, pulse 74, temperature 99.2 F (37.3 C), temperature source Oral, resp. rate 17, height 5' 9" (1.753 m), weight 130.4 kg (287 lb 7.7 oz), SpO2 96 %.  PHYSICAL EXAM: Overall appearance:  Critically ill, sedated, on ventilator with endotracheal tube in place. Head:  Normocephalic, atraumatic. Ears: External ears appear normal. Nose: External nose is healthy in appearance. Internal nasal exam free of any lesions or obstruction. Oral Cavity:  Endotracheal tube in place. Neuro:  Heavily sedated. Neck: No palpable neck masses.  Studies Reviewed: none  Procedures: none   Assessment/Plan: Agree with need for tracheostomy. Have it scheduled for tomorrow morning.  Ashrita Chrismer 07/02/2015, 2:24 PM      

## 2015-07-02 NOTE — Progress Notes (Signed)
Attempted to obtain phone consent from wife however get the voicemail.  I checked with primary RN and they do not have another number listed for her other than the one listed in computer.   Will attempt later for consent.

## 2015-07-02 NOTE — Progress Notes (Signed)
Patient ID: Jonathan Le, male   DOB: 04-26-1946, 69 y.o.   MRN: [id number] 12 Days Post-Op  Subjective: Pt intubated, sedated  Objective: Vital signs in last 24 hours: Temp:  [98 F (36.7 C)-99.8 F (37.7 C)] 98 F (36.7 C) (08/18 0800) Pulse Rate:  [73-104] 73 (08/18 0900) Resp:  [16-29] 16 (08/18 0900) BP: (91-129)/(50-72) 96/55 mmHg (08/18 0900) SpO2:  [92 %-100 %] 97 % (08/18 0900) FiO2 (%):  [40 %] 40 % (08/18 0727) Weight:  [130.4 kg (287 lb 7.7 oz)] 130.4 kg (287 lb 7.7 oz) (08/18 0500) Last BM Date: 07/01/15  Intake/Output from previous day: 08/17 0701 - 08/18 0700 In: 1337.7 [I.V.:594.7; NG/GT:593; IV Piggyback:150] Out: 2575 [Urine:1825; Emesis/NG output:100; Drains:50; Stool:600] Intake/Output this shift: Total I/O In: 235.2 [I.V.:55.2; NG/GT:180] Out: 640 [Urine:640]  PE: Abd: VAC removed.  Wound clean, but very shallow, +BS, ileostomy with good output (600-700/day)  Lab Results:   Recent Labs  07/01/15 0441 07/02/15 0530  WBC 10.2 12.3*  HGB 12.5* 11.2*  HCT 39.5 35.2*  PLT 318 357   BMET  Recent Labs  07/01/15 0314 07/02/15 0530  NA 140 142  K 4.7 3.6  CL 106 104  CO2 25 30  GLUCOSE 106* 130*  BUN 12 18  CREATININE 0.56* 0.72  CALCIUM 8.2* 8.3*   PT/INR  Recent Labs  07/01/15 0314 07/02/15 0530  LABPROT 17.3* 17.2*  INR 1.40 1.39   CMP     Component Value Date/Time   NA 142 07/02/2015 0530   K 3.6 07/02/2015 0530   CL 104 07/02/2015 0530   CO2 30 07/02/2015 0530   GLUCOSE 130* 07/02/2015 0530   BUN 18 07/02/2015 0530   CREATININE 0.72 07/02/2015 0530   CALCIUM 8.3* 07/02/2015 0530   PROT 5.1* 06/29/2015 0610   ALBUMIN 1.3* 06/29/2015 0610   AST 29 06/29/2015 0610   ALT 16* 06/29/2015 0610   ALKPHOS 85 06/29/2015 0610   BILITOT 1.2 06/29/2015 0610   GFRNONAA >60 07/02/2015 0530   GFRAA >60 07/02/2015 0530   Lipase     Component Value Date/Time   LIPASE 15* 05/19/2015 2140       Studies/Results: Dg Chest  Port 1 View  07/02/2015   CLINICAL DATA:  Respiratory failure.  EXAM: PORTABLE CHEST - 1 VIEW  COMPARISON:  07/01/2015 .  FINDINGS: Endotracheal tube, left IJ line, NG tube in stable position. Mediastinum and hilar structures are normal. Prior CABG. Stable cardiomegaly. Interim partial clearing of bibasilar atelectasis and/or infiltrates. Small left pleural effusion. No pneumothorax.  IMPRESSION: 1. Lines and tubes in stable position. 2. Interim partial clearing of bibasilar atelectasis and/or infiltrates. Small left pleural effusion. 3. Stable cardiomegaly.  Prior CABG.   Electronically Signed   By: Maisie Fus  Register   On: 07/02/2015 07:20   Dg Chest Port 1 View  07/01/2015   CLINICAL DATA:  69 year old male with ventilator dependent respiratory failure. Initial encounter.  EXAM: PORTABLE CHEST - 1 VIEW  COMPARISON:  06/30/2015 and earlier.  FINDINGS: Portable AP semi upright view at 0557 hours. Stable endotracheal tube position. Enteric tube courses to the abdomen, tip not included. Stable left IJ approach central line.  Lung volumes have not significantly changed. Veiling confluent opacity at both lung base compatible with pleural effusions and lower lobe collapse. No pneumothorax. Stable pulmonary vascularity without overt edema. Overall ventilation is mildly decreased. Stable cardiac size and mediastinal contours. Sequelae of CABG.  IMPRESSION: 1.  Stable lines and tubes. 2. Bilateral  pleural effusions and lower lobe atelectasis, ventilation has mildly decreased since yesterday.   Electronically Signed   By: Odessa Fleming M.D.   On: 07/01/2015 07:48   Dg Chest Port 1 View  06/30/2015   CLINICAL DATA:  Acute respiratory failure requiring intubation.  EXAM: PORTABLE CHEST - 1 VIEW  COMPARISON:  06/24/2015 and earlier.  FINDINGS: Endotracheal tube tip projects approximately 3 cm above the carina. Left jugular central venous catheter tip projects at the junction of the innominate vein and SVC. Since examination 6  days ago, improved aeration in the lung bases, though atelectasis persists, left greater than right. No new pulmonary parenchymal abnormalities.  IMPRESSION: 1. Endotracheal tube tip in satisfactory position projecting approximately 3 cm above the carina. 2. Left jugular venous catheter tip projects at the junction of the innominate vein and SVC, unchanged. 3. Improved aeration in the lung bases since 06/24/2015, with bibasilar atelectasis persisting, left greater than right. 4. No new abnormalities.   Electronically Signed   By: Hulan Saas M.D.   On: 06/30/2015 17:15    Anti-infectives: Anti-infectives    Start     Dose/Rate Route Frequency Ordered Stop   06/20/15 2200  cefTAZidime (FORTAZ) 2 g in dextrose 5 % 50 mL IVPB     2 g 100 mL/hr over 30 Minutes Intravenous 3 times per day 06/20/15 1537 07/02/15 0555   06/20/15 0200  vancomycin (VANCOCIN) IVPB 1000 mg/200 mL premix  Status:  Discontinued     1,000 mg 200 mL/hr over 60 Minutes Intravenous Every 12 hours 06/19/15 1512 06/20/15 0737   06/19/15 1200  vancomycin (VANCOCIN) 1,750 mg in sodium chloride 0.9 % 500 mL IVPB  Status:  Discontinued     1,750 mg 250 mL/hr over 120 Minutes Intravenous Every 24 hours 06/18/15 1030 06/19/15 1512   06/18/15 1200  fluconazole (DIFLUCAN) IVPB 400 mg  Status:  Discontinued     400 mg 100 mL/hr over 120 Minutes Intravenous Every 24 hours 06/18/15 1050 06/25/15 0925   06/18/15 1130  vancomycin (VANCOCIN) 2,500 mg in sodium chloride 0.9 % 500 mL IVPB     2,500 mg 250 mL/hr over 120 Minutes Intravenous  Once 06/18/15 1030 06/18/15 1314   06/18/15 1130  cefTAZidime (FORTAZ) 2 g in dextrose 5 % 50 mL IVPB  Status:  Discontinued     2 g 100 mL/hr over 30 Minutes Intravenous Every 12 hours 06/18/15 1030 06/20/15 1537   06/18/15 1030  metroNIDAZOLE (FLAGYL) IVPB 500 mg  Status:  Discontinued     500 mg 100 mL/hr over 60 Minutes Intravenous Every 8 hours 06/18/15 1000 06/25/15 0925        Assessment/Plan   POD 14,12, s/p ex lap with subtotal colectomy, open abdomen, take back with creation of ileostomy and closure -remains intubated, plan to call ENT for trach -patient is surgically stable, TFs going -Dc VAC, will start BID WD dressing changes given how shallow his wound is. -routine ostomy care -will see patient twice a week, please call if needed in the interim. DVT prophylaxis -SCDs/Heparin     LOS: 14 days    Brittnei Jagiello E 07/02/2015, 11:55 AM Pager: [phone]

## 2015-07-02 NOTE — Progress Notes (Addendum)
PULMONARY / CRITICAL CARE MEDICINE   Name: RAHEEL KUNKLE MRN: [id number] DOB: 08/06/1946    ADMISSION DATE:  06/18/2015 CONSULTATION DATE: 06/18/15   CHIEF COMPLAINT:  Septic shock secondary to ischemic colon. Acute respiratiory failure. Acute renal failure.  INITIAL PRESENTATION: 69 year old male with PMH of OSA and HTN. 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 Post discharge course complicated by R leg cellulitis after endoscopic vein harvest he was seen by Dr. Donata Clay in clinic for this,was having some constipation. 8/4 PM he took an enema and was straining in bathroom. He became diaphoretic and had loss of consciousness. In ED he was noted to be profoundly hypotensive with SOB. Found to be in severe septic shock with ischemic colon s/p total abd colectomy on 8/4  STUDIES:  Echo (8/4) estimated ejection fraction was in the range of 25% to 30%. Severe hypokinesis of the anteroseptal and anterior myocardium.Dyskinesis of the apical myocardium. .  CT chest abd pelvis (8/4) CTA CHEST: Limited CTA of the chest, poor bolus timing. Moderate to severe cardiomegaly with small pericardial effusion.Evidence of pulmonary edema with small pleural effusions and, patchy airspace opacities most consistent with confluent edema.  CT ABDOMEN AND PELVIS: Focal colonic hepatic flexure pneumatosis without bowel perforation. Moderate to large amount of distal retained large bowel stool. Mild probable ileus, no bowel obstruction.  Bilateral nonobstructing nephrolithiasis measuring up to 4 mm.  CT abdomen 8/12 >> no evidence intra-abd abscess, ascites present, GB sludge, small B pleural effusions.   SIGNIFICANT EVENTS: 8/4- Total abdominal colectomy with abdominal vac placement 8/6 Exploratory laparotomy, removal of  prior packs, closure of open abdomen, ileostomy 8/7 high pressors 8/8 self extubated 8/9  resp distress , required bipap , lasix 8/13 had to restart levophed for shock overnight. 8/16 reintubated, primarily for MS, agitation, tachypnea  SUBJECTIVE:   Tolerating diuresis Interacts on WUA this am  VITAL SIGNS: Temp:  [97.9 F (36.6 C)-99.8 F (37.7 C)] 99.8 F (37.7 C) (08/18 0727) Pulse Rate:  [76-104] 78 (08/18 0800) Resp:  [10-29] 18 (08/18 0800) BP: (91-129)/(46-72) 110/58 mmHg (08/18 0800) SpO2:  [92 %-100 %] 99 % (08/18 0800) FiO2 (%):  [40 %] 40 % (08/18 0727) Weight:  [130.4 kg (287 lb 7.7 oz)] 130.4 kg (287 lb 7.7 oz) (08/18 0500)   HEMODYNAMICS:    VENTILATOR SETTINGS: Vent Mode:  [-] PRVC FiO2 (%):  [40 %] 40 % Set Rate:  [16 bmp] 16 bmp Vt Set:  [570 mL] 570 mL PEEP:  [10 cmH20] 10 cmH20 Plateau Pressure:  [18 cmH20-24 cmH20] 18 cmH20  INTAKE / OUTPUT:  Intake/Output Summary (Last 24 hours) at 07/02/15 0856 Last data filed at 07/02/15 0800  Gross per 24 hour  Intake 1353.59 ml  Output   2585 ml  Net -1231.41 ml   PHYSICAL EXAMINATION: General: no resp distress, comfortable on ATC Neuro wakes to voice, interactive, non focal Head: [place]/AT EENT: PERRL, no JVD noted, trach site clean Cardiovascular: Regular Rate and rhythm. No MRG Lungs: Decreased breath sounds antr Abdomen: Soft, non-tender, surgical dressing in place Ext: 2+ edema  LABS:  CBC  Recent Labs Lab 06/28/15 0520 06/29/15 0610 07/01/15 0441  WBC 19.3* 13.0* 10.2  HGB 12.2* 12.2* 12.5*  HCT 37.6* 38.5* 39.5  PLT 241 283 318   Coag's  Recent Labs Lab 06/30/15 0720 07/01/15 0314 07/02/15 0530  APTT INR 1.30 1.40 1.39   BMET  Recent Labs Lab 06/29/15 0610 07/01/15 0314 07/02/15 0530  NA 141  139 140 142  K 3.8  3.7 4.7 3.6  CL 104  102 106 104  CO2 32  BUN CREATININE 0.55*  0.53* 0.56* 0.72  GLUCOSE 126*  126* 106* 130*    Electrolytes  Recent Labs Lab 06/26/15 0415  06/28/15 0520 06/29/15 0610 07/01/15 0314 07/02/15 0530  CALCIUM 8.4*  < > 8.3* 8.4*  8.3* 8.2* 8.3*  MG 1.8  --  1.8 1.9  1.9  --   --   PHOS 3.3  --  3.1 3.0  3.0  --   --   < > = values in this interval not displayed. Sepsis Markers No results for input(s): LATICACIDVEN, PROCALCITON, O2SATVEN in the last 168 hours. ABG  Recent Labs Lab 06/28/15 1555 06/30/15 1640 06/30/15 1840  PHART 7.437 7.472* 7.413  PCO2ART 42.9 37.1 49.2*  PO2ART 76.8* 63.0* 337.0*   Liver Enzymes  Recent Labs Lab 06/29/15 0610  AST 29  ALT 16*  ALKPHOS 85  BILITOT 1.2  ALBUMIN 1.3*   Cardiac Enzymes No results for input(s): TROPONINI, PROBNP in the last 168 hours. Glucose  Recent Labs Lab 07/01/15 0808 07/01/15 1129 07/01/15 1635 07/01/15 1921 07/01/15 2320 07/02/15 0353  GLUCAP 108* 107* 105* 108* 136* 117*   Imaging Dg Chest Port 1 View  07/02/2015   CLINICAL DATA:  Respiratory failure.  EXAM: PORTABLE CHEST - 1 VIEW  COMPARISON:  07/01/2015 .  FINDINGS: Endotracheal tube, left IJ line, NG tube in stable position. Mediastinum and hilar structures are normal. Prior CABG. Stable cardiomegaly. Interim partial clearing of bibasilar atelectasis and/or infiltrates. Small left pleural effusion. No pneumothorax.  IMPRESSION: 1. Lines and tubes in stable position. 2. Interim partial clearing of bibasilar atelectasis and/or infiltrates. Small left pleural effusion. 3. Stable cardiomegaly.  Prior CABG.   Electronically Signed   By: Maisie Fus  Register   On: 07/02/2015 07:20   I reviewed CXR from 8/18 personally >> cardiomegaly, LLL atx slightly improved, RLL atx much improved  ASSESSMENT / PLAN:  PULMONARY ETT 8/4 >>8/8 ETT 8/16 >>  A: Acute resp failure, post -op OSA Episode of resp distress 8/9 - ? Fluid overload vs aspiration Reintubated 8/16 largely for altered MS, agitation, then hypersomnolence with sedation P:   Wean PEEP over  today 8/18 Push for PSV / SBT's when PEEP to 5 ? Whether he will need trach to get through this illness, allow nocturnal ventilation, resolution of his delirium  Will need nocturnal CPAP once extubated if we do not move to trach  CARDIOVASCULAR A: Septic shock on pressors Ischemic cardiomyopathy -H/O CABG on 7/16 P:  Norepi off since am 8/16 Diuresis restarted 8/16, continued at lower dose 8/17  RENAL A:  AKI, improved Hypomag P:   Replete lytes as needed. Follow BMP, on scheduled lasix  GASTROINTESTINAL A:  s/p total colectomy for ischemic bowel S/p  ileostomy  P:   D/c'd TPN,  Using GT for TF's Protonix for SUP.  HEMATOLOGIC A: Leucocytosis P:  Transfusion only if HB <7.  INFECTIOUS A:  Septic shock from ischemic bowel. All cultures are negative to date.  Leucocytosis -trending down P:   Abx: Ceftaz 8/4>> 8/18 Vanco 8/5>> 8/6 Flagyl 8/4>> 8/11 Diflucan 8/4>>8/11  Ct Abx x  14 ds, rpt abd CT shows no abscess. Plan d/c on 8/18 Right foot ulcer - wound care follow up  ENDOCRINE A:  Hyperglycemia without h/o DM P:  SSI   NEUROLOGIC A: Pain, Anxiety  deconditioning P:   Propofol + fentanyl prn  FAMILY  - Updates: Reviewed status with the pt's sister and brother-in-law on 8/17. I mentioned to them the contributors to his resp failure. I have asked them to consider the benefits of tracheostomy to allow [place] to make more steady progress. Called pt's wife 8/18 > explained pt's status, slow progress. I do believe that he has a chance to recover from this, but clearly it is slowly. He will likely need intermittent nocturnal vent, will benefit from decreased sedation needs. I recommended trach and she understands and agrees. I will consult ENT  Independent CC time 35 minutes.    Levy Pupa, MD, PhD 07/02/2015, 8:56 AM [facility] [phone] or if no answer [phone]

## 2015-07-02 NOTE — Discharge Instructions (Signed)
Dressing Change °A dressing is a material placed over wounds. It keeps the wound clean, dry, and protected from further injury. This provides an environment that favors wound healing.  °BEFORE YOU BEGIN °· Get your supplies together. Things you may need include: °¨ Saline solution. °¨ Flexible gauze dressing. °¨ Medicated cream. °¨ Tape. °¨ Gloves. °¨ Abdominal dressing pads. °¨ Gauze squares. °¨ Plastic bags. °· Take pain medicine 30 minutes before the dressing change if you need it. °· Take a shower before you do the first dressing change of the day. Use plastic wrap or a plastic bag to prevent the dressing from getting wet. °REMOVING YOUR OLD DRESSING  °· Wash your hands with soap and water. Dry your hands with a clean towel. °· Put on your gloves. °· Remove any tape. °· Carefully remove the old dressing. If the dressing sticks, you may dampen it with warm water to loosen it, or follow your caregiver's specific directions. °· Remove any gauze or packing tape that is in your wound. °· Take off your gloves. °· Put the gloves, tape, gauze, or any packing tape into a plastic bag. °CHANGING YOUR DRESSING °· Open the supplies. °· Take the cap off the saline solution. °· Open the gauze package so that the gauze remains on the inside of the package. °· Put on your gloves. °· Clean your wound as told by your caregiver. °· If you have been told to keep your wound dry, follow those instructions. °· Your caregiver may tell you to do one or more of the following: °¨ Pick up the gauze. Pour the saline solution over the gauze. Squeeze out the extra saline solution. °¨ Put medicated cream or other medicine on your wound if you have been told to do so. °¨ Put the solution soaked gauze only in your wound, not on the skin around it. °¨ Pack your wound loosely or as told by your caregiver. °¨ Put dry gauze on your wound. °¨ Put abdominal dressing pads over the dry gauze if your wet gauze soaks through. °· Tape the abdominal dressing  pads in place so they will not fall off. Do not wrap the tape completely around the affected part (arm, leg, abdomen). °· Wrap the dressing pads with a flexible gauze dressing to secure it in place. °· Take off your gloves. Put them in the plastic bag with the old dressing. Tie the bag shut and throw it away. °· Keep the dressing clean and dry until your next dressing change. °· Wash your hands. °SEEK MEDICAL CARE IF: °· Your skin around the wound looks red. °· Your wound feels more tender or sore. °· You see pus in the wound. °· Your wound smells bad. °· You have a fever. °· Your skin around the wound has a rash that itches and burns. °· You see black or yellow skin in your wound that was not there before. °· You feel nauseous, throw up, and feel very tired. °Document Released: 12/08/2004 Document Revised: 01/23/2012 Document Reviewed: 09/12/2011 °ExitCare® Patient Information ©2015 [employer]. This information is not intended to replace advice given to you by your health care provider. Make sure you discuss any questions you have with your health care provider. ° ° °[facility]      [facility] °[phone] ° °OPEN ABDOMINAL SURGERY: POST OP INSTRUCTIONS ° °Always review your discharge instruction sheet given to you by the facility where your surgery was performed. ° °IF YOU HAVE DISABILITY OR FAMILY LEAVE   FORMS, YOU MUST BRING THEM TO THE OFFICE FOR PROCESSING.  PLEASE DO NOT GIVE THEM TO YOUR DOCTOR. ° °1. A prescription for pain medication may be given to you upon discharge.  Take your pain medication as prescribed, if needed.  If narcotic pain medicine is not needed, then you may take acetaminophen (Tylenol) or ibuprofen (Advil) as needed. °2. Take your usually prescribed medications unless otherwise directed. °3. If you need a refill on your pain medication, please contact your pharmacy. They will contact our office to request authorization.  Prescriptions will not be filled after 5pm or on  week-ends. °4. You should follow a light diet the first few days after arrival home, such as soup and crackers, pudding, etc.unless your doctor has advised otherwise. A high-fiber, low fat diet can be resumed as tolerated.   Be sure to include lots of fluids daily. Most patients will experience some swelling and bruising on the chest and neck area.  Ice packs will help.  Swelling and bruising can take several days to resolve °5. Most patients will experience some swelling and bruising in the area of the incision. Ice pack will help. Swelling and bruising can take several days to resolve..  °6. It is common to experience some constipation if taking pain medication after surgery.  Increasing fluid intake and taking a stool softener will usually help or prevent this problem from occurring.  A mild laxative (Milk of Magnesia or Miralax) should be taken according to package directions if there are no bowel movements after 48 hours. °7.  You may have steri-strips (small skin tapes) in place directly over the incision.  These strips should be left on the skin for 7-10 days.  If your surgeon used skin glue on the incision, you may shower in 24 hours.  The glue will flake off over the next 2-3 weeks.  Any sutures or staples will be removed at the office during your follow-up visit. You may find that a light gauze bandage over your incision may keep your staples from being rubbed or pulled. You may shower and replace the bandage daily. °8. ACTIVITIES:  You may resume regular (light) daily activities beginning the next day--such as daily self-care, walking, climbing stairs--gradually increasing activities as tolerated.  You may have sexual intercourse when it is comfortable.  Refrain from any heavy lifting or straining until approved by your doctor. °a. You may drive when you no longer are taking prescription pain medication, you can comfortably wear a seatbelt, and you can safely maneuver your car and apply brakes °b. Return  to Work: ___________________________________ °9. You should see your doctor in the office for a follow-up appointment approximately two weeks after your surgery.  Make sure that you call for this appointment within a day or two after you arrive home to insure a convenient appointment time. °OTHER INSTRUCTIONS:  °_____________________________________________________________ °_____________________________________________________________ ° °WHEN TO CALL YOUR DOCTOR: °1. Fever over 101.0 °2. Inability to urinate °3. Nausea and/or vomiting °4. Extreme swelling or bruising °5. Continued bleeding from incision. °6. Increased pain, redness, or drainage from the incision. °7. Difficulty swallowing or breathing °8. Muscle cramping or spasms. °9. Numbness or tingling in hands or feet or around lips. ° °The clinic staff is available to answer your questions during regular business hours.  Please don’t hesitate to call and ask to speak to one of the nurses if you have concerns. ° °For further questions, please visit [email or web address] ° ° °

## 2015-07-02 NOTE — Progress Notes (Signed)
eLink Physician-Brief Progress Note Patient Name: BETTIE SWAVELY DOB: 1946/02/16 MRN: [id number]   Date of Service  07/02/2015  HPI/Events of Note  Hypokalemia. K+ = 3.6 and Creatinine = 0.72.  eICU Interventions  Will replete K+.     Intervention Category Intermediate Interventions: Electrolyte abnormality - evaluation and management  Charm Stenner Eugene 07/02/2015, 7:12 PM

## 2015-07-02 NOTE — Progress Notes (Signed)
Peripherally Inserted Central Catheter/Midline Placement  The IV Nurse has discussed with the patient and/or persons authorized to consent for the patient, the purpose of this procedure and the potential benefits and risks involved with this procedure.  The benefits include less needle sticks, lab draws from the catheter and patient may be discharged home with the catheter.  Risks include, but not limited to, infection, bleeding, blood clot (thrombus formation), and puncture of an artery; nerve damage and irregular heat beat.  Alternatives to this procedure were also discussed.  PICC/Midline Placement Documentation        Lisabeth Devoid 07/02/2015, 2:54 PM Phone consent obtained from wife.

## 2015-07-02 NOTE — Progress Notes (Signed)
I attempted to call for phone consent for PICC insertion.   Still getting a voicemail.

## 2015-07-03 ENCOUNTER — Inpatient Hospital Stay (HOSPITAL_COMMUNITY): Payer: Medicare Other | Admitting: Anesthesiology

## 2015-07-03 ENCOUNTER — Encounter (HOSPITAL_COMMUNITY): Payer: Self-pay | Admitting: Anesthesiology

## 2015-07-03 ENCOUNTER — Encounter (HOSPITAL_COMMUNITY)
Admission: EM | Disposition: A | Discharge status: Nursing Home (Includes ICF) | Payer: Self-pay | Source: Home / Self Care | Attending: Pulmonary Disease

## 2015-07-03 ENCOUNTER — Inpatient Hospital Stay (HOSPITAL_COMMUNITY): Payer: Medicare Other

## 2015-07-03 HISTORY — PX: TRACHEOSTOMY TUBE PLACEMENT: SHX814

## 2015-07-03 LAB — GLUCOSE, CAPILLARY
GLUCOSE-CAPILLARY: 90 mg/dL (ref 65–99)
GLUCOSE-CAPILLARY: 98 mg/dL (ref 65–99)
Glucose-Capillary: 91 mg/dL (ref 65–99)
Glucose-Capillary: 91 mg/dL (ref 65–99)
Glucose-Capillary: 96 mg/dL (ref 65–99)

## 2015-07-03 LAB — PROTIME-INR
INR: 1.31 (ref 0.00–1.49)
Prothrombin Time: 16.4 seconds — ABNORMAL HIGH (ref 11.6–15.2)

## 2015-07-03 LAB — BASIC METABOLIC PANEL
Anion gap: 8 (ref 5–15)
BUN: 19 mg/dL (ref 6–20)
CHLORIDE: 104 mmol/L (ref 101–111)
CO2: 29 mmol/L (ref 22–32)
Calcium: 8.2 mg/dL — ABNORMAL LOW (ref 8.9–10.3)
Creatinine, Ser: 0.5 mg/dL — ABNORMAL LOW (ref 0.61–1.24)
GFR calc Af Amer: >60 mL/min (ref >60)
GFR calc non Af Amer: >60 mL/min (ref >60)
GLUCOSE: 115 mg/dL — AB (ref 65–99)
POTASSIUM: 3.4 mmol/L — AB (ref 3.5–5.1)
Sodium: 141 mmol/L (ref 135–145)

## 2015-07-03 LAB — APTT: aPTT: 31 seconds (ref 24–37)

## 2015-07-03 SURGERY — CREATION, TRACHEOSTOMY
Anesthesia: General | Site: Neck

## 2015-07-03 MED ORDER — MIDAZOLAM HCL 5 MG/5ML IJ SOLN
INTRAMUSCULAR | Status: DC | PRN
  Administered 2015-07-03: 2 mg via INTRAVENOUS

## 2015-07-03 MED ORDER — ARTIFICIAL TEARS OP OINT
TOPICAL_OINTMENT | OPHTHALMIC | Status: DC | PRN
  Administered 2015-07-03: 1 via OPHTHALMIC

## 2015-07-03 MED ORDER — PHENYLEPHRINE HCL 10 MG/ML IJ SOLN
INTRAMUSCULAR | Status: DC | PRN
  Administered 2015-07-03 (×3): 120 ug via INTRAVENOUS

## 2015-07-03 MED ORDER — 0.9 % SODIUM CHLORIDE (POUR BTL) OPTIME
TOPICAL | Status: DC | PRN
  Administered 2015-07-03: 1000 mL

## 2015-07-03 MED ORDER — POTASSIUM CHLORIDE 20 MEQ/15ML (10%) PO SOLN
20.0000 meq | ORAL | Status: AC
  Administered 2015-07-03: 20 meq
  Filled 2015-07-03 (×2): qty 15

## 2015-07-03 MED ORDER — LIDOCAINE-EPINEPHRINE 1 %-1:100000 IJ SOLN
INTRAMUSCULAR | Status: AC
  Filled 2015-07-03: qty 1

## 2015-07-03 MED ORDER — MEPERIDINE HCL 25 MG/ML IJ SOLN: 6.2500 mg | INTRAMUSCULAR | Status: DC | PRN

## 2015-07-03 MED ORDER — HYDROMORPHONE HCL 1 MG/ML IJ SOLN: 0.2500 mg | INTRAMUSCULAR | Status: DC | PRN

## 2015-07-03 MED ORDER — ROCURONIUM BROMIDE 100 MG/10ML IV SOLN
INTRAVENOUS | Status: DC | PRN
  Administered 2015-07-03: 50 mg via INTRAVENOUS

## 2015-07-03 MED ORDER — MIDAZOLAM HCL 2 MG/2ML IJ SOLN
1.0000 mg | INTRAMUSCULAR | Status: DC | PRN
  Administered 2015-07-05: 2 mg via INTRAVENOUS
  Filled 2015-07-03: qty 2

## 2015-07-03 MED ORDER — POTASSIUM CHLORIDE 10 MEQ/50ML IV SOLN
10.0000 meq | INTRAVENOUS | Status: AC
  Administered 2015-07-03 (×3): 10 meq via INTRAVENOUS
  Filled 2015-07-03: qty 50

## 2015-07-03 MED ORDER — FENTANYL CITRATE (PF) 100 MCG/2ML IJ SOLN
INTRAMUSCULAR | Status: DC | PRN
  Administered 2015-07-03: 50 ug via INTRAVENOUS

## 2015-07-03 MED ORDER — MIDAZOLAM HCL 2 MG/2ML IJ SOLN
INTRAMUSCULAR | Status: AC
  Filled 2015-07-03: qty 4

## 2015-07-03 MED ORDER — PROMETHAZINE HCL 25 MG/ML IJ SOLN: 6.2500 mg | INTRAMUSCULAR | Status: DC | PRN

## 2015-07-03 MED ORDER — FENTANYL CITRATE (PF) 250 MCG/5ML IJ SOLN
INTRAMUSCULAR | Status: AC
  Filled 2015-07-03: qty 5

## 2015-07-03 MED ORDER — PROPOFOL 10 MG/ML IV BOLUS
INTRAVENOUS | Status: AC
  Filled 2015-07-03: qty 20

## 2015-07-03 MED ORDER — FENTANYL CITRATE (PF) 100 MCG/2ML IJ SOLN
25.0000 ug | INTRAMUSCULAR | Status: DC | PRN
  Administered 2015-07-03: 25 ug via INTRAVENOUS
  Filled 2015-07-03: qty 2

## 2015-07-03 MED ORDER — LACTATED RINGERS IV SOLN
INTRAVENOUS | Status: DC | PRN
  Administered 2015-07-03: 08:00:00 via INTRAVENOUS

## 2015-07-03 MED ORDER — LIDOCAINE-EPINEPHRINE 1 %-1:100000 IJ SOLN
INTRAMUSCULAR | Status: DC | PRN
  Administered 2015-07-03: 20 mL

## 2015-07-03 SURGICAL SUPPLY — 41 items
APL SKNCLS STERI-STRIP NONHPOA (GAUZE/BANDAGES/DRESSINGS)
BENZOIN TINCTURE PRP APPL 2/3 (GAUZE/BANDAGES/DRESSINGS) IMPLANT
BLADE SURG 15 STRL LF DISP TIS (BLADE) IMPLANT
BLADE SURG 15 STRL SS (BLADE) ×3
BLADE SURG ROTATE 9660 (MISCELLANEOUS) IMPLANT
CANISTER SUCTION 2500CC (MISCELLANEOUS) ×3 IMPLANT
CLEANER TIP ELECTROSURG 2X2 (MISCELLANEOUS) ×3 IMPLANT
COVER SURGICAL LIGHT HANDLE (MISCELLANEOUS) ×3 IMPLANT
DECANTER SPIKE VIAL GLASS SM (MISCELLANEOUS) ×3 IMPLANT
DRAPE PROXIMA HALF (DRAPES) ×2 IMPLANT
ELECT COATED BLADE 2.86 ST (ELECTRODE) ×3 IMPLANT
ELECT REM PT RETURN 9FT ADLT (ELECTROSURGICAL) ×3
ELECTRODE REM PT RTRN 9FT ADLT (ELECTROSURGICAL) ×1 IMPLANT
GAUZE SPONGE 4X4 16PLY XRAY LF (GAUZE/BANDAGES/DRESSINGS) ×3 IMPLANT
GLOVE BIO SURGEON STRL SZ7.5 (GLOVE) ×2 IMPLANT
GLOVE BIOGEL PI IND STRL 6.5 (GLOVE) IMPLANT
GLOVE BIOGEL PI INDICATOR 6.5 (GLOVE) ×2
GLOVE ECLIPSE 7.5 STRL STRAW (GLOVE) ×3 IMPLANT
GLOVE SURG SS PI 6.5 STRL IVOR (GLOVE) ×2 IMPLANT
GOWN STRL REUS W/ TWL LRG LVL3 (GOWN DISPOSABLE) ×2 IMPLANT
GOWN STRL REUS W/TWL LRG LVL3 (GOWN DISPOSABLE) ×6
KIT BASIN OR (CUSTOM PROCEDURE TRAY) ×3 IMPLANT
KIT ROOM TURNOVER OR (KITS) ×3 IMPLANT
NEEDLE 27GAX1X1/2 (NEEDLE) ×3 IMPLANT
NS IRRIG 1000ML POUR BTL (IV SOLUTION) ×3 IMPLANT
PACK EENT II TURBAN DRAPE (CUSTOM PROCEDURE TRAY) ×3 IMPLANT
PAD ARMBOARD 7.5X6 YLW CONV (MISCELLANEOUS) ×6 IMPLANT
PENCIL FOOT CONTROL (ELECTRODE) ×3 IMPLANT
SUT CHROMIC 2 0 SH (SUTURE) ×3 IMPLANT
SUT ETHILON 3 0 PS 1 (SUTURE) ×3 IMPLANT
SUT SILK 3 0 (SUTURE) ×3
SUT SILK 3-0 18XBRD TIE 12 (SUTURE) IMPLANT
SUT SILK 4 0 TIE 10X30 (SUTURE) ×5 IMPLANT
SUT SILK 4 0 TIES 17X18 (SUTURE) ×3 IMPLANT
SYR 20CC LL (SYRINGE) ×3 IMPLANT
SYR CONTROL 10ML LL (SYRINGE) ×2 IMPLANT
TOWEL OR 17X24 6PK STRL BLUE (TOWEL DISPOSABLE) ×3 IMPLANT
TUBE CONNECTING 12'X1/4 (SUCTIONS) ×1
TUBE CONNECTING 12X1/4 (SUCTIONS) ×2 IMPLANT
TUBE TRACH SHILEY 8 DIST CUF (TUBING) ×2 IMPLANT
WATER STERILE IRR 1000ML POUR (IV SOLUTION) ×3 IMPLANT

## 2015-07-03 NOTE — Progress Notes (Addendum)
Patient no longer has an OG following extubation, no route to give PO potassium and nutritional supplement, CCM notified and aware, will hold off on panda/NG tube per MD verbal order to see how patient progresses.  Hermina Barters, RN

## 2015-07-03 NOTE — Transfer of Care (Signed)
Immediate Anesthesia Transfer of Care Note  Patient: Jonathan Le  Procedure(s) Performed: Procedure(s): TRACHEOSTOMY (N/A)  Patient Location: SICU  Anesthesia Type:General  Level of Consciousness: Patient remains intubated per anesthesia plan  Airway & Oxygen Therapy: Patient remains intubated per anesthesia plan and Patient placed on Ventilator (see vital sign flow sheet for setting)  Post-op Assessment: Report given to RN, Post -op Vital signs reviewed and stable and Patient moving all extremities X 4  Post vital signs: Reviewed and stable  Last Vitals:  Filed Vitals:   07/03/15 0800  BP: 106/55  Pulse: 75  Temp:   Resp: 17    Complications: No apparent anesthesia complications

## 2015-07-03 NOTE — H&P (View-Only) (Signed)
Reason for Consult: Chronic ventilator dependence Referring Physician: Rigoberto Noel, MD  Jonathan Le is an 69 y.o. male.  HPI: History of septic shock and extensive critical care following ischemic colon. Tracheostomy requested.  Past Medical History  Diagnosis Date  . Anxiety   . Hypertension   . Arthritis   . Sleep apnea     wears CPAP nightly  . Glaucoma   . CHF (congestive heart failure)     MI, with open heart surgery x 30 sdays ago per pt. (06/26/15)    Past Surgical History  Procedure Laterality Date  . Hernia repair      right  . Eye surgery      bil cataract  . Wisdom tooth extraction    . Fasciotomy foot / toe Left     foot  . Cardiac catheterization N/A 05/20/2015    Procedure: Left Heart Cath and Coronary Angiography;  Surgeon: Burnell Blanks, MD;  Location: [facility];  Service: Cardiovascular;  Laterality: N/A;  . Coronary artery bypass graft N/A 05/22/2015    Procedure: CORONARY ARTERY BYPASS GRAFTING (CABG), ON PUMP, TIMES THREE, USING LEFT INTERNAL MAMMARY ARTERY, RIGHT GREATER SAPHENOUS VEIN HARVESTED ENDOSCOPICALLY;  Surgeon: Ivin Poot, MD;  Location: [place];  Service: Open Heart Surgery;  Laterality: N/A;  LIMA-LAD, SVG-OM, SVG-PD  . Tee without cardioversion N/A 05/22/2015    Procedure: TRANSESOPHAGEAL ECHOCARDIOGRAM (TEE);  Surgeon: Ivin Poot, MD;  Location: [place];  Service: Open Heart Surgery;  Laterality: N/A;  . Laparotomy N/A 06/18/2015    Procedure: EXPLORATORY LAPAROTOMY;  Surgeon: Rolm Bookbinder, MD;  Location: [place];  Service: General;  Laterality: N/A;  . Laparotomy N/A 06/20/2015    Procedure: WASHOUT OF ABDOMEN, CLOSURE OF ABDOMINAL WALL WITH PLACEMENT OF WOUND VAC AND ILLEOSTOMY ;  Surgeon: Stark Klein, MD;  Location: [place];  Service: General;  Laterality: N/A;    No family history on file.  Social History:  reports that he has never smoked. He has never used smokeless tobacco. He reports that he does not drink  alcohol or use illicit drugs.  Allergies:  Allergies  Allergen Reactions  . Levaquin [Levofloxacin] Nausea Only  . Penicillins Hives  . Doxycycline Rash    Medications: Reviewed  Results for orders placed or performed during the hospital encounter of 06/18/15 (from the past 48 hour(s))  Glucose, capillary     Status: Abnormal   Collection Time: 06/30/15  4:26 PM  Result Value Ref Range   Glucose-Capillary 129 (H) 65 - 99 mg/dL   Comment 1 Capillary Specimen    Comment 2 Notify RN   I-STAT 3, arterial blood gas (G3+)     Status: Abnormal   Collection Time: 06/30/15  4:40 PM  Result Value Ref Range   pH, Arterial 7.472 (H) 7.350 - 7.450   pCO2 arterial 37.1 35.0 - 45.0 mmHg   pO2, Arterial 63.0 (L) 80.0 - 100.0 mmHg   Bicarbonate 27.2 (H) 20.0 - 24.0 mEq/L   TCO2 28 0 - 100 mmol/L   O2 Saturation 93.0 %   Acid-Base Excess 3.0 (H) 0.0 - 2.0 mmol/L   Patient temperature 98.3 F    Collection site FEMORAL ARTERY    Sample type ARTERIAL   Culture, respiratory (NON-Expectorated)     Status: None   Collection Time: 06/30/15  5:45 PM  Result Value Ref Range   Specimen Description TRACHEAL ASPIRATE    Special Requests Normal    Gram Stain  FEW WBC PRESENT,BOTH PMN AND MONONUCLEAR RARE SQUAMOUS EPITHELIAL CELLS PRESENT RARE GRAM POSITIVE COCCI IN PAIRS Performed at [facility]    Culture      Non-Pathogenic Oropharyngeal-type [place]ora Isolated. Performed at [facility]    Report Status 07/02/2015 FINAL   Glucose, capillary     Status: Abnormal   Collection Time: 06/30/15  5:51 PM  Result Value Ref Range   Glucose-Capillary 114 (H) 65 - 99 mg/dL   Comment 1 Capillary Specimen   I-STAT 3, arterial blood gas (G3+)     Status: Abnormal   Collection Time: 06/30/15  6:40 PM  Result Value Ref Range   pH, Arterial 7.413 7.350 - 7.450   pCO2 arterial 49.2 (H) 35.0 - 45.0 mmHg   pO2, Arterial 337.0 (H) 80.0 - 100.0 mmHg   Bicarbonate 31.4 (H) 20.0 - 24.0 mEq/L    TCO2 33 0 - 100 mmol/L   O2 Saturation 100.0 %   Acid-Base Excess 6.0 (H) 0.0 - 2.0 mmol/L   Collection site RADIAL, ALLEN'S TEST ACCEPTABLE    Drawn by VENIPUNCTURE    Sample type ARTERIAL   Glucose, capillary     Status: Abnormal   Collection Time: 06/30/15  7:21 PM  Result Value Ref Range   Glucose-Capillary 115 (H) 65 - 99 mg/dL   Comment 1 Capillary Specimen    Comment 2 Notify RN    Comment 3 Document in Chart   Triglycerides     Status: None   Collection Time: 06/30/15  8:18 PM  Result Value Ref Range   Triglycerides 137 <150 mg/dL  Glucose, capillary     Status: Abnormal   Collection Time: 06/30/15 11:54 PM  Result Value Ref Range   Glucose-Capillary 101 (H) 65 - 99 mg/dL   Comment 1 Capillary Specimen    Comment 2 Notify RN    Comment 3 Document in Chart   Protime-INR     Status: Abnormal   Collection Time: 07/01/15  3:14 AM  Result Value Ref Range   Prothrombin Time 17.3 (H) 11.6 - 15.2 seconds   INR 1.40 0.00 - 1.49  APTT     Status: None   Collection Time: 07/01/15  3:14 AM  Result Value Ref Range   aPTT 26 24 - 37 seconds  Basic metabolic panel     Status: Abnormal   Collection Time: 07/01/15  3:14 AM  Result Value Ref Range   Sodium 140 135 - 145 mmol/L   Potassium 4.7 3.5 - 5.1 mmol/L    Comment: DELTA CHECK NOTED NO VISIBLE HEMOLYSIS    Chloride 106 101 - 111 mmol/L   CO2 25 22 - 32 mmol/L   Glucose, Bld 106 (H) 65 - 99 mg/dL   BUN 12 6 - 20 mg/dL   Creatinine, Ser 0.56 (L) 0.61 - 1.24 mg/dL   Calcium 8.2 (L) 8.9 - 10.3 mg/dL   GFR calc non Af Amer >60 >60 mL/min   GFR calc Af Amer >60 >60 mL/min    Comment: (NOTE) The eGFR has been calculated using the CKD EPI equation. This calculation has not been validated in all clinical situations. eGFR's persistently <60 mL/min signify possible Chronic Kidney Disease.    Anion gap 9 5 - 15  Glucose, capillary     Status: None   Collection Time: 07/01/15  3:58 AM  Result Value Ref Range    Glucose-Capillary 96 65 - 99 mg/dL   Comment 1 Capillary Specimen    Comment   2 Notify RN    Comment 3 Document in Chart   CBC     Status: Abnormal   Collection Time: 07/01/15  4:41 AM  Result Value Ref Range   WBC 10.2 4.0 - 10.5 K/uL   RBC 4.40 4.22 - 5.81 MIL/uL   Hemoglobin 12.5 (L) 13.0 - 17.0 g/dL   HCT 39.5 39.0 - 52.0 %   MCV 89.8 78.0 - 100.0 fL   MCH 28.4 26.0 - 34.0 pg   MCHC 31.6 30.0 - 36.0 g/dL   RDW 16.1 (H) 11.5 - 15.5 %   Platelets 318 150 - 400 K/uL  Glucose, capillary     Status: Abnormal   Collection Time: 07/01/15  8:08 AM  Result Value Ref Range   Glucose-Capillary 108 (H) 65 - 99 mg/dL   Comment 1 Capillary Specimen    Comment 2 Notify RN   Glucose, capillary     Status: Abnormal   Collection Time: 07/01/15 11:29 AM  Result Value Ref Range   Glucose-Capillary 107 (H) 65 - 99 mg/dL   Comment 1 Capillary Specimen    Comment 2 Notify RN   Glucose, capillary     Status: Abnormal   Collection Time: 07/01/15  4:35 PM  Result Value Ref Range   Glucose-Capillary 105 (H) 65 - 99 mg/dL   Comment 1 Capillary Specimen    Comment 2 Notify RN   Glucose, capillary     Status: Abnormal   Collection Time: 07/01/15  7:21 PM  Result Value Ref Range   Glucose-Capillary 108 (H) 65 - 99 mg/dL   Comment 1 Capillary Specimen    Comment 2 Notify RN    Comment 3 Document in Chart   Glucose, capillary     Status: Abnormal   Collection Time: 07/01/15 11:20 PM  Result Value Ref Range   Glucose-Capillary 136 (H) 65 - 99 mg/dL   Comment 1 Capillary Specimen    Comment 2 Notify RN    Comment 3 Document in Chart   Glucose, capillary     Status: Abnormal   Collection Time: 07/02/15  3:53 AM  Result Value Ref Range   Glucose-Capillary 117 (H) 65 - 99 mg/dL   Comment 1 Capillary Specimen    Comment 2 Notify RN    Comment 3 Document in Chart   Protime-INR     Status: Abnormal   Collection Time: 07/02/15  5:30 AM  Result Value Ref Range   Prothrombin Time 17.2 (H) 11.6 -  15.2 seconds   INR 1.39 0.00 - 1.49  APTT     Status: None   Collection Time: 07/02/15  5:30 AM  Result Value Ref Range   aPTT 26 24 - 37 seconds  Basic metabolic panel     Status: Abnormal   Collection Time: 07/02/15  5:30 AM  Result Value Ref Range   Sodium 142 135 - 145 mmol/L   Potassium 3.6 3.5 - 5.1 mmol/L    Comment: DELTA CHECK NOTED   Chloride 104 101 - 111 mmol/L   CO2 30 22 - 32 mmol/L   Glucose, Bld 130 (H) 65 - 99 mg/dL   BUN 18 6 - 20 mg/dL   Creatinine, Ser 0.72 0.61 - 1.24 mg/dL   Calcium 8.3 (L) 8.9 - 10.3 mg/dL   GFR calc non Af Amer >60 >60 mL/min   GFR calc Af Amer >60 >60 mL/min    Comment: (NOTE) The eGFR has been calculated using the CKD EPI equation. This   calculation has not been validated in all clinical situations. eGFR's persistently <60 mL/min signify possible Chronic Kidney Disease.    Anion gap 8 5 - 15  CBC     Status: Abnormal   Collection Time: 07/02/15  5:30 AM  Result Value Ref Range   WBC 12.3 (H) 4.0 - 10.5 K/uL   RBC 3.97 (L) 4.22 - 5.81 MIL/uL   Hemoglobin 11.2 (L) 13.0 - 17.0 g/dL   HCT 35.2 (L) 39.0 - 52.0 %   MCV 88.7 78.0 - 100.0 fL   MCH 28.2 26.0 - 34.0 pg   MCHC 31.8 30.0 - 36.0 g/dL   RDW 16.0 (H) 11.5 - 15.5 %   Platelets 357 150 - 400 K/uL  Glucose, capillary     Status: Abnormal   Collection Time: 07/02/15  8:47 AM  Result Value Ref Range   Glucose-Capillary 136 (H) 65 - 99 mg/dL   Comment 1 Notify RN   Glucose, capillary     Status: Abnormal   Collection Time: 07/02/15 12:42 PM  Result Value Ref Range   Glucose-Capillary 131 (H) 65 - 99 mg/dL   Comment 1 Notify RN     Dg Chest Port 1 View  07/02/2015   CLINICAL DATA:  Respiratory failure.  EXAM: PORTABLE CHEST - 1 VIEW  COMPARISON:  07/01/2015 .  FINDINGS: Endotracheal tube, left IJ line, NG tube in stable position. Mediastinum and hilar structures are normal. Prior CABG. Stable cardiomegaly. Interim partial clearing of bibasilar atelectasis and/or infiltrates.  Small left pleural effusion. No pneumothorax.  IMPRESSION: 1. Lines and tubes in stable position. 2. Interim partial clearing of bibasilar atelectasis and/or infiltrates. Small left pleural effusion. 3. Stable cardiomegaly.  Prior CABG.   Electronically Signed   By: Thomas  Register   On: 07/02/2015 07:20   Dg Chest Port 1 View  07/01/2015   CLINICAL DATA:  68-year-old male with ventilator dependent respiratory failure. Initial encounter.  EXAM: PORTABLE CHEST - 1 VIEW  COMPARISON:  06/30/2015 and earlier.  FINDINGS: Portable AP semi upright view at 0557 hours. Stable endotracheal tube position. Enteric tube courses to the abdomen, tip not included. Stable left IJ approach central line.  Lung volumes have not significantly changed. Veiling confluent opacity at both lung base compatible with pleural effusions and lower lobe collapse. No pneumothorax. Stable pulmonary vascularity without overt edema. Overall ventilation is mildly decreased. Stable cardiac size and mediastinal contours. Sequelae of CABG.  IMPRESSION: 1.  Stable lines and tubes. 2. Bilateral pleural effusions and lower lobe atelectasis, ventilation has mildly decreased since yesterday.   Electronically Signed   By: H  Hall M.D.   On: 07/01/2015 07:48   Dg Chest Port 1 View  06/30/2015   CLINICAL DATA:  Acute respiratory failure requiring intubation.  EXAM: PORTABLE CHEST - 1 VIEW  COMPARISON:  06/24/2015 and earlier.  FINDINGS: Endotracheal tube tip projects approximately 3 cm above the carina. Left jugular central venous catheter tip projects at the junction of the innominate vein and SVC. Since examination 6 days ago, improved aeration in the lung bases, though atelectasis persists, left greater than right. No new pulmonary parenchymal abnormalities.  IMPRESSION: 1. Endotracheal tube tip in satisfactory position projecting approximately 3 cm above the carina. 2. Left jugular venous catheter tip projects at the junction of the innominate vein and  SVC, unchanged. 3. Improved aeration in the lung bases since 06/24/2015, with bibasilar atelectasis persisting, left greater than right. 4. No new abnormalities.   Electronically Signed     By: Thomas  Lawrence M.D.   On: 06/30/2015 17:15    ROS:Negative except as listed in admit H&P  Blood pressure 101/55, pulse 74, temperature 99.2 F (37.3 C), temperature source Oral, resp. rate 17, height 5' 9" (1.753 m), weight 130.4 kg (287 lb 7.7 oz), SpO2 96 %.  PHYSICAL EXAM: Overall appearance:  Critically ill, sedated, on ventilator with endotracheal tube in place. Head:  Normocephalic, atraumatic. Ears: External ears appear normal. Nose: External nose is healthy in appearance. Internal nasal exam free of any lesions or obstruction. Oral Cavity:  Endotracheal tube in place. Neuro:  Heavily sedated. Neck: No palpable neck masses.  Studies Reviewed: none  Procedures: none   Assessment/Plan: Agree with need for tracheostomy. Have it scheduled for tomorrow morning.  ,  07/02/2015, 2:24 PM      

## 2015-07-03 NOTE — Anesthesia Preprocedure Evaluation (Addendum)
Anesthesia Evaluation  Patient identified by MRN, date of birth, ID band Patient awake    Reviewed: Allergy & Precautions, NPO status , Patient's Chart, lab work & pertinent test results, reviewed documented beta blocker date and time   Airway Mallampati: Intubated       Dental no notable dental hx.    Pulmonary shortness of breath, sleep apnea ,  breath sounds clear to auscultation  Pulmonary exam normal       Cardiovascular hypertension, Pt. on home beta blockers and Pt. on medications + CAD, + Past MI and +CHF Normal cardiovascular examRhythm:Regular Rate:Normal     Neuro/Psych Anxiety negative neurological ROS  negative psych ROS   GI/Hepatic negative GI ROS, Neg liver ROS,   Endo/Other  Morbid obesity  Renal/GU negative Renal ROS     Musculoskeletal  (+) Arthritis -,   Abdominal (+) + obese,   Peds  Hematology negative hematology ROS (+)   Anesthesia Other Findings   Reproductive/Obstetrics negative OB ROS                            Anesthesia Physical  Anesthesia Plan  ASA: IV  Anesthesia Plan: General   Post-op Pain Management:    Induction: Intravenous  Airway Management Planned: Oral ETT  Additional Equipment: Arterial line  Intra-op Plan:   Post-operative Plan: Post-operative intubation/ventilation  Informed Consent: I have reviewed the patients History and Physical, chart, labs and discussed the procedure including the risks, benefits and alternatives for the proposed anesthesia with the patient or authorized representative who has indicated his/her understanding and acceptance.   Dental advisory given  Plan Discussed with: CRNA  Anesthesia Plan Comments:         Anesthesia Quick Evaluation

## 2015-07-03 NOTE — Interval H&P Note (Signed)
History and Physical Interval Note:  07/03/2015 8:19 AM  Jonathan Le  has presented today for surgery, with the diagnosis of RESPITORY FAILURE  The various methods of treatment have been discussed with the patient and family. After consideration of risks, benefits and other options for treatment, the patient has consented to  Procedure(s): TRACHEOSTOMY (N/A) as a surgical intervention .  The patient's history has been reviewed, patient examined, no change in status, stable for surgery.  I have reviewed the patient's chart and labs.  Questions were answered to the patient's satisfaction.     [name]

## 2015-07-03 NOTE — Progress Notes (Signed)
Dr. Darrick Penna notified of potassium of 3.4

## 2015-07-03 NOTE — Anesthesia Procedure Notes (Signed)
Date/Time: 07/03/2015 8:00 AM Performed by: Bronson Ing F Preoxygenation: Existing 7.5 subglottic Ett.

## 2015-07-03 NOTE — Op Note (Signed)
07/03/2015 9:02 AM   PATIENT:  Jonathan Le, 69 y.o. male  PRE-OPERATIVE DIAGNOSIS:  RESPITORY FAILURE  POST-OPERATIVE DIAGNOSIS:  RESPITORY FAILURE   PROCEDURE:  Procedure(s): TRACHEOSTOMY  SURGEON:  Surgeon(s): Susy Frizzle, MD  ASSISTANTS: none   ANESTHESIA:   general  EBL: Minimal   DRAINS: none   LOCAL MEDICATIONS USED:  NONE  COUNTS CORRECT:  YES  PROCEDURE DETAILS: Patient was taken to the operating room and placed on the operating table in the supine position. A shoulder roll was placed for positioning. The patient was previously orally intubated. The neck was prepped and draped in a standard fashion. A vertical incision was created just above the sternal notch using electrocautery. The midline fascia was divided. The isthmus of the thyroid was divided with electrocautery and the upper trachea was exposed. A single 4-0 silk suture was used to tie a small thyroid vessel. A tracheotomy was created between the second and third tracheal rings in a horizontal fashion. A lower tracheal flap was created with scissors and the flap was sutured to the cervical skin using 2-0 chromic suture. The orotracheal tube was removed. The #8 Shiley tracheostomy tube was placed without difficulty and the cuff was inflated. The shield was secured to the neck using a Velcro straps and nylon suture. The patient was then transferred back to the intensive care unit in critical condition.  PLAN OF CARE: Transfer to ICU  PATIENT DISPOSITION:  ICU - hemodynamically stable.

## 2015-07-03 NOTE — Progress Notes (Signed)
Patient back from OR-trach placement; patient resting comfortably; will continue to monitor and assess.  Hermina Barters, RN

## 2015-07-03 NOTE — Progress Notes (Signed)
PULMONARY / CRITICAL CARE MEDICINE   Name: Jonathan Le MRN: [id number] DOB: Nov 07, 1946    ADMISSION DATE:  06/18/2015 CONSULTATION DATE: 06/18/15   CHIEF COMPLAINT:  Septic shock secondary to ischemic colon. Acute respiratiory failure. Acute renal failure.  INITIAL PRESENTATION: 69 year old male with PMH of OSA and HTN. 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 Post discharge course complicated by R leg cellulitis after endoscopic vein harvest he was seen by Dr. Donata Clay in clinic for this,was having some constipation. 8/4 PM he took an enema and was straining in bathroom. He became diaphoretic and had loss of consciousness. In ED he was noted to be profoundly hypotensive with SOB. Found to be in severe septic shock with ischemic colon s/p total abd colectomy on 8/4  STUDIES:  Echo (8/4) estimated ejection fraction was in the range of 25% to 30%. Severe hypokinesis of the anteroseptal and anterior myocardium.Dyskinesis of the apical myocardium. .  CT chest abd pelvis (8/4) CTA CHEST: Limited CTA of the chest, poor bolus timing. Moderate to severe cardiomegaly with small pericardial effusion.Evidence of pulmonary edema with small pleural effusions and, patchy airspace opacities most consistent with confluent edema.  CT ABDOMEN AND PELVIS: Focal colonic hepatic flexure pneumatosis without bowel perforation. Moderate to large amount of distal retained large bowel stool. Mild probable ileus, no bowel obstruction.  Bilateral nonobstructing nephrolithiasis measuring up to 4 mm.  CT abdomen 8/12 >> no evidence intra-abd abscess, ascites present, GB sludge, small B pleural effusions.   SIGNIFICANT EVENTS: 8/4- Total abdominal colectomy with abdominal vac placement 8/6 Exploratory laparotomy, removal of  prior packs, closure of open abdomen, ileostomy 8/7 high pressors 8/8 self extubated 8/9  resp distress , required bipap , lasix 8/13 had to restart levophed for shock overnight. 8/16 reintubated, primarily for MS, agitation, tachypnea  SUBJECTIVE:   Went for trach this am by Dr Pollyann Kennedy  VITAL SIGNS: Temp:  [97.9 F (36.6 C)-100.2 F (37.9 C)] 100.2 F (37.9 C) (08/19 0726) Pulse Rate:  [68-89] 78 (08/19 1100) Resp:  [8-29] 16 (08/19 1100) BP: (85-123)/(43-60) 92/45 mmHg (08/19 1100) SpO2:  [95 %-100 %] 98 % (08/19 1100) FiO2 (%):  [40 %] 40 % (08/19 0924) Weight:  [130.8 kg (288 lb 5.8 oz)] 130.8 kg (288 lb 5.8 oz) (08/19 0500)   HEMODYNAMICS:    VENTILATOR SETTINGS: Vent Mode:  [-] PRVC FiO2 (%):  [40 %] 40 % Set Rate:  [16 bmp] 16 bmp Vt Set:  [570 mL] 570 mL PEEP:  [5 cmH20-8 cmH20] 5 cmH20 Plateau Pressure:  [16 cmH20-19 cmH20] 19 cmH20  INTAKE / OUTPUT:  Intake/Output Summary (Last 24 hours) at 07/03/15 1109 Last data filed at 07/03/15 1100  Gross per 24 hour  Intake 1645.55 ml  Output   2878 ml  Net -1232.45 ml   PHYSICAL EXAMINATION: General: no resp distress, sedated  Neuro wakes to voice, interactive, non focal Head: [place]/AT EENT: PERRL, no JVD noted, trach site clean Cardiovascular: Regular Rate and rhythm. No MRG Lungs: Decreased breath sounds antr Abdomen: Soft, non-tender, surgical dressing in place Ext: 2+ edema  LABS:  CBC  Recent Labs Lab 06/29/15 0610 07/01/15 0441 07/02/15 0530  WBC 13.0* 10.2 12.3*  HGB 12.2* 12.5* 11.2*  HCT 38.5* 39.5 35.2*  PLT 283 318 357   Coag's  Recent Labs Lab 07/01/15 0314 07/02/15 0530 07/03/15 0416  APTT 26 26 31   INR 1.40 1.39 1.31   BMET  Recent Labs Lab 07/01/15 0314 07/02/15 0530 07/03/15 0416  NA 140 142 141  K 4.7 3.6 3.4*  CL 106 104 104  CO2 25 30 29   BUN 12 18 19   CREATININE 0.56* 0.72 0.50*  GLUCOSE 106* 130* 115*   Electrolytes  Recent Labs Lab 06/28/15 0520  06/29/15 0610 07/01/15 0314 07/02/15 0530 07/03/15 0416  CALCIUM 8.3* 8.4*  8.3* 8.2* 8.3* 8.2*  MG 1.8 1.9  1.9  --   --   --   PHOS 3.1 3.0  3.0  --   --   --    Sepsis Markers No results for input(s): LATICACIDVEN, PROCALCITON, O2SATVEN in the last 168 hours. ABG  Recent Labs Lab 06/28/15 1555 06/30/15 1640 06/30/15 1840  PHART 7.437 7.472* 7.413  PCO2ART 42.9 37.1 49.2*  PO2ART 76.8* 63.0* 337.0*   Liver Enzymes  Recent Labs Lab 06/29/15 0610  AST 29  ALT 16*  ALKPHOS 85  BILITOT 1.2  ALBUMIN 1.3*   Cardiac Enzymes No results for input(s): TROPONINI, PROBNP in the last 168 hours. Glucose  Recent Labs Lab 07/02/15 1242 07/02/15 1621 07/02/15 1916 07/02/15 2346 07/03/15 0348 07/03/15 0723  GLUCAP 131* 127* 109* 126* 91 90   Imaging No results found. I reviewed CXR from 8/18 personally >> cardiomegaly, LLL atx slightly improved, RLL atx much improved  ASSESSMENT / PLAN:  PULMONARY ETT 8/4 >>8/8 ETT 8/16 >> 8/19 Trach 8/19 (Dr Pollyann Kennedy) >>  A: Acute resp failure, post -op OSA Episode of resp distress 8/9 - ? Fluid overload vs aspiration Reintubated 8/16 largely for altered MS, agitation, then hypersomnolence with sedation P:   Appreciate Dr Lucky Rathke assistance, trach placed 8/19 Will move to PSV and  / or ATC as soon as he is awake and able to tolerate.  Abx and diuretic management as below  CARDIOVASCULAR A: Septic shock on pressors Ischemic cardiomyopathy -H/O CABG on 7/16 P:  Norepi off since am 8/16 Diuresis restarted 8/16, continued at lower dose 8/17  RENAL A:  AKI, improved Hypomag Hypokalemia  P:   Replete lytes as needed. Follow BMP, on scheduled lasix  GASTROINTESTINAL A:  s/p total colectomy for ischemic bowel S/p  ileostomy  P:   D/c'd TPN,  Will place a panda for TF Protonix for SUP.  HEMATOLOGIC A: Leucocytosis P:  Transfusion only if HB <7.  INFECTIOUS A:  Septic shock from ischemic bowel. All cultures are  negative to date.  Leucocytosis -trending down P:   Abx: Ceftaz 8/4>> 8/18 Vanco 8/5>> 8/6 Flagyl 8/4>> 8/11 Diflucan 8/4>>8/11  Abx completed  x 14 ds, rpt abd CT shows no abscess.  Right foot ulcer - wound care follow up  ENDOCRINE A:  Hyperglycemia without h/o DM P:  SSI   NEUROLOGIC A: Pain, Anxiety  deconditioning P:   Propofol + fentanyl prn  FAMILY  - Updates: Reviewed status with the pt's sister and brother-in-law on 8/17. I mentioned to them the contributors to his resp failure. I have asked them to consider the benefits of tracheostomy to allow [place] to make more steady progress. Called pt's wife 8/18 > explained pt's status, slow progress. I do believe that he has a chance to recover from this, but clearly it is slowly. He will likely need intermittent nocturnal vent, will benefit from decreased sedation needs. I recommended trach and she understands and agrees.  Independent CC time 32 minutes.    Levy Pupa, MD, PhD 07/03/2015, 11:09 AM [facility] [phone] or if no answer [phone]

## 2015-07-03 NOTE — Progress Notes (Signed)
[facility] ADULT ICU REPLACEMENT PROTOCOL FOR AM LAB REPLACEMENT ONLY  The patient does apply for the [facility] Adult ICU Electrolyte Replacment Protocol based on the criteria listed below:   1. Is GFR >/= 40 ml/min? Yes.    Patient's GFR today is >60 2. Is urine output >/= 0.5 ml/kg/hr for the last 6 hours? Yes.   Patient's UOP is .5 ml/kg/hr 3. Is BUN < 60 mg/dL? Yes.    Patient's BUN today is 19 4. Abnormal electrolyte  K 3.4 5. Ordered repletion with: per protocol 6. If a panic level lab has been reported, has the CCM MD in charge been notified? Yes.  .   Physician:  deterding  [place] 07/03/2015 6:47 AM

## 2015-07-03 NOTE — Plan of Care (Signed)
Problem: Phase I Progression Outcomes Goal: Tracheostomy by Vent Day 14 Outcome: Completed/Met Date Met:  07/03/15 Tracheostomy on 07/03/15

## 2015-07-03 NOTE — Anesthesia Postprocedure Evaluation (Signed)
Anesthesia Post Note  Patient: Jonathan Le  Procedure(s) Performed: Procedure(s) (LRB): TRACHEOSTOMY (N/A)  Anesthesia type: General  Patient location: ICU  Post pain: Sedated  Post assessment: Post-op Vital signs reviewed  Last Vitals: BP 92/45 mmHg  Pulse 80  Temp(Src) 37.3 C (Oral)  Resp 22  Ht  (1.753 m)  Wt 288 lb 5.8 oz (130.8 kg)  BMI 42.56 kg/m2  SpO2 99%  Post vital signs: Reviewed  Level of consciousness: sedated  Complications: No apparent anesthesia complications

## 2015-07-03 NOTE — Progress Notes (Signed)
Patient's Propofol turned down secondary to blood pressure lower while patient asleep.  Patient now reaching for ETT and trying to pull it out with mittens on.  Propofol taken back up to .

## 2015-07-04 ENCOUNTER — Inpatient Hospital Stay (HOSPITAL_COMMUNITY): Payer: Medicare Other

## 2015-07-04 LAB — GLUCOSE, CAPILLARY
GLUCOSE-CAPILLARY: 90 mg/dL (ref 65–99)
GLUCOSE-CAPILLARY: 117 mg/dL — AB (ref 65–99)
Glucose-Capillary: 104 mg/dL — ABNORMAL HIGH (ref 65–99)
Glucose-Capillary: 111 mg/dL — ABNORMAL HIGH (ref 65–99)
Glucose-Capillary: 118 mg/dL — ABNORMAL HIGH (ref 65–99)
Glucose-Capillary: 94 mg/dL (ref 65–99)

## 2015-07-04 LAB — CBC
HEMATOCRIT: 33.7 % — AB (ref 39.0–52.0)
Hemoglobin: 10.6 g/dL — ABNORMAL LOW (ref 13.0–17.0)
MCH: 27.8 pg (ref 26.0–34.0)
MCHC: 31.5 g/dL (ref 30.0–36.0)
MCV: 88.5 fL (ref 78.0–100.0)
Platelets: 351 10*3/uL (ref 150–400)
RBC: 3.81 MIL/uL — ABNORMAL LOW (ref 4.22–5.81)
RDW: 16.1 % — AB (ref 11.5–15.5)
WBC: 8.8 10*3/uL (ref 4.0–10.5)

## 2015-07-04 LAB — BASIC METABOLIC PANEL
Anion gap: 8 (ref 5–15)
BUN: 18 mg/dL (ref 6–20)
CALCIUM: 8.2 mg/dL — AB (ref 8.9–10.3)
CO2: 31 mmol/L (ref 22–32)
Chloride: 105 mmol/L (ref 101–111)
Creatinine, Ser: 0.54 mg/dL — ABNORMAL LOW (ref 0.61–1.24)
GFR calc Af Amer: >60 mL/min (ref >60)
GLUCOSE: 109 mg/dL — AB (ref 65–99)
Potassium: 3.5 mmol/L (ref 3.5–5.1)
Sodium: 144 mmol/L (ref 135–145)

## 2015-07-04 LAB — PROTIME-INR
INR: 1.41 (ref 0.00–1.49)
Prothrombin Time: 17.4 seconds — ABNORMAL HIGH (ref 11.6–15.2)

## 2015-07-04 LAB — APTT: APTT: 27 s (ref 24–37)

## 2015-07-04 MED ORDER — FENTANYL CITRATE (PF) 100 MCG/2ML IJ SOLN
25.0000 ug | INTRAMUSCULAR | Status: DC | PRN
  Administered 2015-07-05 (×2): 25 ug via INTRAVENOUS
  Filled 2015-07-04 (×2): qty 2

## 2015-07-04 MED ORDER — POTASSIUM CHLORIDE 20 MEQ/15ML (10%) PO SOLN
20.0000 meq | Freq: Every day | ORAL | Status: DC
  Administered 2015-07-04: 20 meq
  Filled 2015-07-04 (×3): qty 15

## 2015-07-04 NOTE — Progress Notes (Signed)
Patient placed on 35% trach collar.  Currently tolerating well.  Will continue to monitor.  

## 2015-07-04 NOTE — Progress Notes (Signed)
PULMONARY / CRITICAL CARE MEDICINE   Name: Jonathan Le MRN: [id number] DOB: 1946/01/30    ADMISSION DATE:  06/18/2015 CONSULTATION DATE: 06/18/15   CHIEF COMPLAINT:  Septic shock secondary to ischemic colon. Acute respiratiory failure. Acute renal failure.  INITIAL PRESENTATION: 69 year old male with PMH of OSA and HTN. 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 Post discharge course complicated by R leg cellulitis after endoscopic vein harvest he was seen by Dr. Donata Clay in clinic for this,was having some constipation. 8/4 PM he took an enema and was straining in bathroom. He became diaphoretic and had loss of consciousness. In ED he was noted to be profoundly hypotensive with SOB. Found to be in severe septic shock with ischemic colon s/p total abd colectomy on 8/4  STUDIES:  Echo (8/4) estimated ejection fraction was in the range of 25% to 30%. Severe hypokinesis of the anteroseptal and anterior myocardium.Dyskinesis of the apical myocardium. .  CT chest abd pelvis (8/4) CTA CHEST: Limited CTA of the chest, poor bolus timing. Moderate to severe cardiomegaly with small pericardial effusion.Evidence of pulmonary edema with small pleural effusions and, patchy airspace opacities most consistent with confluent edema.  CT ABDOMEN AND PELVIS: Focal colonic hepatic flexure pneumatosis without bowel perforation. Moderate to large amount of distal retained large bowel stool. Mild probable ileus, no bowel obstruction.  Bilateral nonobstructing nephrolithiasis measuring up to 4 mm.  CT abdomen 8/12 >> no evidence intra-abd abscess, ascites present, GB sludge, small B pleural effusions.   SIGNIFICANT EVENTS: 8/4- Total abdominal colectomy with abdominal vac placement 8/6 Exploratory laparotomy, removal of  prior packs, closure of open abdomen, ileostomy 8/7 high pressors 8/8 self extubated 8/9  resp distress , required bipap , lasix 8/13 had to restart levophed for shock overnight. 8/16 reintubated, primarily for MS, agitation, tachypnea 8/19 Trach placed  SUBJECTIVE:   Tolerated ATC yesterday and then back to vent o/n Tells me he wants something to drink  VITAL SIGNS: Temp:  [98.4 F (36.9 C)-99.2 F (37.3 C)] 99 F (37.2 C) (08/20 0400) Pulse Rate:  [78-102] 94 (08/20 0800) Resp:  [16-35] 22 (08/20 0800) BP: (89-121)/(43-60) 121/55 mmHg (08/20 0800) SpO2:  [97 %-100 %] 100 % (08/20 0800) FiO2 (%):  [35 %-40 %] 35 % (08/20 0720) Weight:  [129.321 kg (285 lb 1.6 oz)] 129.321 kg (285 lb 1.6 oz) (08/20 0500)   HEMODYNAMICS:    VENTILATOR SETTINGS: Vent Mode:  [-] PRVC FiO2 (%):  [35 %-40 %] 35 % Set Rate:  [16 bmp] 16 bmp Vt Set:  [570 mL] 570 mL PEEP:  [5 cmH20] 5 cmH20 Pressure Support:  [10 cmH20] 10 cmH20 Plateau Pressure:  [14 cmH20-17 cmH20] 17 cmH20  INTAKE / OUTPUT:  Intake/Output Summary (Last 24 hours) at 07/04/15 0849 Last data filed at 07/04/15 0800  Gross per 24 hour  Intake 1012.6 ml  Output   2423 ml  Net -1410.4 ml   PHYSICAL EXAMINATION: General: no resp distress, on ATC Neuro awake, following commands, still a bit confused, moves all ext Head: /AT EENT: PERRL, no JVD noted, trach site clean, coughing up tan secretions  Cardiovascular: Regular Rate and rhythm. No MRG Lungs: Decreased breath sounds antr Abdomen: Soft, non-tender, surgical dressing in place Ext: 2+ edema  LABS:  CBC  Recent Labs Lab 07/01/15  0441 07/02/15 0530 07/04/15 0400  WBC 10.2 12.3* 8.8  HGB 12.5* 11.2* 10.6*  HCT 39.5 35.2* 33.7*  PLT 318 357 351   Coag's  Recent Labs Lab 07/02/15 0530 07/03/15 0416 07/04/15 0400  APTT 26 31 27   INR 1.39 1.31 1.41   BMET  Recent Labs Lab 07/02/15 0530 07/03/15 0416 07/04/15 0400  NA 142 141 144  K 3.6 3.4*  3.5  CL 104 104 105  CO2 30 29 31   BUN 18 19 18   CREATININE 0.72 0.50* 0.54*  GLUCOSE 130* 115* 109*   Electrolytes  Recent Labs Lab 06/28/15 0520 06/29/15 0610  07/02/15 0530 07/03/15 0416 07/04/15 0400  CALCIUM 8.3* 8.4*  8.3*  < > 8.3* 8.2* 8.2*  MG 1.8 1.9  1.9  --   --   --   --   PHOS 3.1 3.0  3.0  --   --   --   --   < > = values in this interval not displayed. Sepsis Markers No results for input(s): LATICACIDVEN, PROCALCITON, O2SATVEN in the last 168 hours. ABG  Recent Labs Lab 06/28/15 1555 06/30/15 1640 06/30/15 1840  PHART 7.437 7.472* 7.413  PCO2ART 42.9 37.1 49.2*  PO2ART 76.8* 63.0* 337.0*   Liver Enzymes  Recent Labs Lab 06/29/15 0610  AST 29  ALT 16*  ALKPHOS 85  BILITOT 1.2  ALBUMIN 1.3*   Cardiac Enzymes No results for input(s): TROPONINI, PROBNP in the last 168 hours. Glucose  Recent Labs Lab 07/03/15 0723 07/03/15 1152 07/03/15 1546 07/03/15 1933 07/04/15 0011 07/04/15 0412  GLUCAP 90 91 96 98 104* 94   Imaging Dg Abd Portable 1v  07/03/2015   CLINICAL DATA:  Feeding tube placement.  EXAM: PORTABLE ABDOMEN - 1 VIEW  COMPARISON:  CT scan 06/26/2015  FINDINGS: The feeding tube tip is in the region of the duodenum bulb. Paucity of bowel gas.  IMPRESSION: Feeding tube tip is in the region of the duodenum bulb.   Electronically Signed   By: Rudie Meyer M.D.   On: 07/03/2015 13:32   I reviewed CXR from 8/18 personally >> cardiomegaly, LLL atx slightly improved, RLL atx much improved  ASSESSMENT / PLAN:  PULMONARY ETT 8/4 >>8/8 ETT 8/16 >> 8/19 Trach 8/19 (Dr Pollyann Kennedy) >>  A: Acute resp failure, post -op OSA Episode of resp distress 8/9 - ? Fluid overload vs aspiration Reintubated 8/16 largely for altered MS, agitation, then hypersomnolence with sedation P:   Appreciate Dr Lucky Rathke assistance, trach placed 8/19 Will continue ATC all day, use PSV as nocturnal mode since he is on CPAP qhs Diuretic management as below Will ask  SLP to eval for cuff down and PO  CARDIOVASCULAR A: Septic shock on pressors Ischemic cardiomyopathy -H/O CABG on 7/16 P:  Norepi off since am 8/16 Diuresis restarted 8/16, continued at lower dose 8/17  RENAL A:  AKI, improved Hypomag Hypokalemia  P:   Replete lytes as needed. Follow BMP, on scheduled lasix Scheduled potassium added 8/20 Will likely need to decrease diuresis as he approaches even volume status (currently still 6L positive for the hosp)  GASTROINTESTINAL A:  s/p total colectomy for ischemic bowel S/p  ileostomy  P:   D/c'd TPN,  Will place a panda for TF, restart feeds 8/20 Protonix for SUP.  HEMATOLOGIC A: Anemia Leukocytosis - improved P:  Transfusion only if HB <7.  INFECTIOUS A:  Septic shock from ischemic bowel. R LE ulcer, chronic P:   Abx: Ceftaz 8/4>> 8/18  Vanco 8/5>> 8/6 Flagyl 8/4>> 8/11 Diflucan 8/4>>8/11  Abx completed  x 14 days, rpt abd CT showed no abscess.  Right foot ulcer - wound care follow up  ENDOCRINE A:  Hyperglycemia without h/o DM P:  SSI   NEUROLOGIC A: Pain, Anxiety  deconditioning P:   D/c propofol Fentanyl prn  GLOBAL:  OK to transfer to SDU bed with vent capabilities. Will place transfer order 8/20  FAMILY  - Updates: Reviewed status with the pt's sister and brother-in-law on 8/17. I mentioned to them the contributors to his resp failure. I have asked them to consider the benefits of tracheostomy to allow [place] to make more steady progress. Called pt's wife 8/18 > explained pt's status, slow progress. I do believe that he has a chance to recover from this, but clearly it is slowly. He will likely need intermittent nocturnal vent, will benefit from decreased sedation needs. I recommended trach and she understands and agrees.   Independent CC time 32 minutes.    Levy Pupa, MD, PhD 07/04/2015, 8:49 AM [facility] [phone] or if no answer [phone]

## 2015-07-04 NOTE — Progress Notes (Signed)
Physical Therapy Treatment Patient Details Name: Jonathan Le MRN: [id number] DOB: [birth year] Today's Date: 07/04/2015    History of Present Illness Adm 8/4 with sepsis; ischemic bowel; 8/4 colectomy with abd VAC placed; 8/6 ilieostomy; extubated 8/8 PMHx-CABG 05/22/15, HTN, OA    PT Comments    Pt awake and participating in treatment. Attempted standing 2 times and pt with good effort but could not clear buttocks from bed. May be a candidate for sit to stand with sara plus. PT will continue to follow.   Follow Up Recommendations  Supervision/Assistance - 24 hour;CIR     Equipment Recommendations  None recommended by PT    Recommendations for Other Services OT consult     Precautions / Restrictions Precautions Precautions: Fall;Sternal Precaution Comments: reviewed sternal precautions again and how they relate to transfers Restrictions Weight Bearing Restrictions: No Other Position/Activity Restrictions: trach collar    Mobility  Bed Mobility Overal bed mobility: Needs Assistance;+2 for physical assistance;+ 2 for safety/equipment Bed Mobility: Supine to Sit;Sit to Supine     Supine to sit: Mod assist;+2 for physical assistance Sit to supine: Total assist;+2 for physical assistance (+4)   General bed mobility comments: HOb nearly flat for sup to sit, pt required mod A to roll and mod A for legs off bed as well as trunk elevation. Pt had water BM with return to supine and required +4 total assist to get down to bed and legs in safely. Pt rolled right and left for clean up and changing of sheets with mod A. Pt able to help hold top leg up for cleaning.   Transfers Overall transfer level: Needs assistance Equipment used: Rolling walker (2 wheeled) Transfers: Sit to/from Stand Sit to Stand: +2 physical assistance;Total assist         General transfer comment: attempted sit to stand 2x with rocking for momentum first, but even with +2 tot A, pt unable to clear buttocks  from bed. Pt cued for hands on knees each time. Was going to attempt lateral scooting but pt began to not feel well and then had BM  Ambulation/Gait             General Gait Details: unable   Stairs            Wheelchair Mobility    Modified Rankin (Stroke Patients Only)       Balance Overall balance assessment: Needs assistance Sitting-balance support: No upper extremity supported;Feet supported Sitting balance-Leahy Scale: Fair Sitting balance - Comments: maintained sitting without UE assist x10 mins but could not accept challenge Postural control: Left lateral lean                          Cognition Arousal/Alertness: Awake/alert Behavior During Therapy: Flat affect Overall Cognitive Status: Difficult to assess         Following Commands: Follows one step commands consistently       General Comments: pt with trach now so hard to assess but appropriate following of commands and seemed to verbalize appropriately, just hard to read his lips.     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 PT Goal Formulation: Patient unable to participate in goal setting Time For Goal Achievement: 07/14/15 Potential to Achieve Goals: Good Progress towards PT goals: Progressing toward goals    Frequency  Min 3X/week    PT Plan Current plan remains appropriate    Co-evaluation             End of Session Equipment Utilized During Treatment: Gait belt;Oxygen Activity Tolerance: Patient limited by fatigue Patient left: in bed;with call bell/phone within reach     Time: 1105-1141 PT  Time Calculation (min) (ACUTE ONLY): 36 min  Charges:  $Therapeutic Activity: 23-37 mins                    G Codes:     Lyanne Co, PT  Acute Rehab Services  [phone]  [place], [place] 07/04/2015, 1:28 PM

## 2015-07-04 NOTE — Progress Notes (Signed)
SLP Cancellation Note  Patient Details Name: Jonathan Le MRN: [id number] DOB: 01-06-1946   Cancelled treatment:       Reason Eval/Treat Not Completed: Patient at procedure or test/unavailable;Other (comment)   ADAMS,PAT, M.S., CCC-SLP 07/04/2015, 3:43 PM

## 2015-07-05 ENCOUNTER — Inpatient Hospital Stay (HOSPITAL_COMMUNITY): Payer: Medicare Other

## 2015-07-05 ENCOUNTER — Encounter (HOSPITAL_COMMUNITY): Payer: Self-pay | Admitting: Otolaryngology

## 2015-07-05 DIAGNOSIS — R509 Fever, unspecified: Secondary | ICD-10-CM

## 2015-07-05 LAB — PROTIME-INR
INR: 1.43 (ref 0.00–1.49)
Prothrombin Time: 17.5 seconds — ABNORMAL HIGH (ref 11.6–15.2)

## 2015-07-05 LAB — BASIC METABOLIC PANEL
ANION GAP: 8 (ref 5–15)
BUN: 20 mg/dL (ref 6–20)
CALCIUM: 8.2 mg/dL — AB (ref 8.9–10.3)
CO2: 31 mmol/L (ref 22–32)
Chloride: 107 mmol/L (ref 101–111)
Creatinine, Ser: 0.51 mg/dL — ABNORMAL LOW (ref 0.61–1.24)
GFR calc Af Amer: >60 mL/min (ref >60)
GLUCOSE: 118 mg/dL — AB (ref 65–99)
POTASSIUM: 3.3 mmol/L — AB (ref 3.5–5.1)
SODIUM: 146 mmol/L — AB (ref 135–145)

## 2015-07-05 LAB — CBC
HCT: 33.3 % — ABNORMAL LOW (ref 39.0–52.0)
Hemoglobin: 10.3 g/dL — ABNORMAL LOW (ref 13.0–17.0)
MCH: 27.5 pg (ref 26.0–34.0)
MCHC: 30.9 g/dL (ref 30.0–36.0)
MCV: 88.8 fL (ref 78.0–100.0)
PLATELETS: 358 10*3/uL (ref 150–400)
RBC: 3.75 MIL/uL — AB (ref 4.22–5.81)
RDW: 16.3 % — AB (ref 11.5–15.5)
WBC: 8.2 10*3/uL (ref 4.0–10.5)

## 2015-07-05 LAB — GLUCOSE, CAPILLARY
GLUCOSE-CAPILLARY: 121 mg/dL — AB (ref 65–99)
GLUCOSE-CAPILLARY: 117 mg/dL — AB (ref 65–99)
GLUCOSE-CAPILLARY: 110 mg/dL — AB (ref 65–99)
GLUCOSE-CAPILLARY: 105 mg/dL — AB (ref 65–99)
GLUCOSE-CAPILLARY: 97 mg/dL (ref 65–99)
Glucose-Capillary: 116 mg/dL — ABNORMAL HIGH (ref 65–99)

## 2015-07-05 LAB — LACTIC ACID, PLASMA: LACTIC ACID, VENOUS: 1.7 mmol/L (ref 0.5–2.0)

## 2015-07-05 LAB — PHOSPHORUS: PHOSPHORUS: 3.1 mg/dL (ref 2.5–4.6)

## 2015-07-05 LAB — MAGNESIUM: Magnesium: 1.7 mg/dL (ref 1.7–2.4)

## 2015-07-05 LAB — APTT: aPTT: 28 seconds (ref 24–37)

## 2015-07-05 LAB — PROCALCITONIN: PROCALCITONIN: 0.31 ng/mL

## 2015-07-05 MED ORDER — PANTOPRAZOLE SODIUM 40 MG IV SOLR
40.0000 mg | Freq: Every day | INTRAVENOUS | Status: DC
  Administered 2015-07-05 – 2015-07-07 (×3): 40 mg via INTRAVENOUS
  Filled 2015-07-05 (×4): qty 40

## 2015-07-05 MED ORDER — POTASSIUM CHLORIDE 10 MEQ/100ML IV SOLN
10.0000 meq | INTRAVENOUS | Status: AC
  Administered 2015-07-05 (×2): 10 meq via INTRAVENOUS
  Filled 2015-07-05 (×2): qty 100

## 2015-07-05 NOTE — Progress Notes (Signed)
Dr.Ramaswamy made aware of increased temperature of 101.3 orders received.

## 2015-07-05 NOTE — Progress Notes (Signed)
SLP Cancellation Note  Patient Details Name: FINNBAR CEDILLOS MRN: [id number] DOB: [birth year]   Cancelled treatment:       Reason Eval/Treat Not Completed: Other (comment). Unable to complete evaluation this date. Will f/u 8/22.   Ferdinand Lango MA, CCC-SLP [phone]    Raedyn Klinck Meryl 07/05/2015, 1:23 PM

## 2015-07-05 NOTE — Progress Notes (Signed)
Patient had pulled feeding tube out, attempted to replace FT, met residual and then could not withdraw feeding tube out with out resistance. Dr. Chase Caller notified with stat [facility] ordered followed by Lelon Perla NP on floor, was able to remove FT with orders to keep out over night till morning rounds.

## 2015-07-05 NOTE — Progress Notes (Signed)
eLink Physician-Brief Progress Note Patient Name: Jonathan Le DOB: 03/19/1946 MRN: [id number]   Date of Service  07/05/2015  HPI/Events of Note  febrikle 101.62F, Prior temp T max 100 24h ago  eICU Interventions  Cbc, bmet, phos, mag, lactate and pct and then decide onculture     Intervention Category Intermediate Interventions: Other:  [name] 07/05/2015, 4:36 AM

## 2015-07-05 NOTE — Progress Notes (Signed)
PULMONARY / CRITICAL CARE MEDICINE   Name: Jonathan Le MRN: [id number] DOB: [birth year]    ADMISSION DATE:  06/18/2015 CONSULTATION DATE: 06/18/15   CHIEF COMPLAINT:  Septic shock secondary to ischemic colon. Acute respiratiory failure. Acute renal failure.  INITIAL PRESENTATION: 69 year old male with PMH of OSA and HTN. 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 Post discharge course complicated by R leg cellulitis after endoscopic vein harvest he was seen by Dr. Donata Clay in clinic for this,was having some constipation. 8/4 PM he took an enema and was straining in bathroom. He became diaphoretic and had loss of consciousness. In ED he was noted to be profoundly hypotensive with SOB. Found to be in severe septic shock with ischemic colon s/p total abd colectomy on 8/4  STUDIES:  Echo (8/4) estimated ejection fraction was in the range of 25% to 30%. Severe hypokinesis of the anteroseptal and anterior myocardium.Dyskinesis of the apical myocardium. .  CT chest abd pelvis (8/4) CTA CHEST: Limited CTA of the chest, poor bolus timing. Moderate to severe cardiomegaly with small pericardial effusion.Evidence of pulmonary edema with small pleural effusions and, patchy airspace opacities most consistent with confluent edema.  CT ABDOMEN AND PELVIS: Focal colonic hepatic flexure pneumatosis without bowel perforation. Moderate to large amount of distal retained large bowel stool. Mild probable ileus, no bowel obstruction.  Bilateral nonobstructing nephrolithiasis measuring up to 4 mm.  CT abdomen 8/12 >> no evidence intra-abd abscess, ascites present, GB sludge, small B pleural effusions.   SIGNIFICANT EVENTS: 8/4- Total abdominal colectomy with abdominal vac placement 8/6 Exploratory laparotomy, removal of  prior packs, closure of open abdomen, ileostomy 8/7 high pressors 8/8 self extubated 8/9  resp distress , required bipap , lasix 8/13 had to restart levophed for shock overnight. 8/16 reintubated, primarily for MS, agitation, tachypnea 8/19 Trach placed  SUBJECTIVE:   Pulled out NGT , Unable to replace  Failed trach collar this am , placed back on PS vent  Fevers overnight , PCT ok , lactate 1.7  Awake and alert watching the race , has intermittent confusion   VITAL SIGNS: Temp:  [98.4 F (36.9 C)-101.3 F (38.5 C)] 100.9 F (38.3 C) (08/21 1152) Pulse Rate:  [96-128] 106 (08/21 1211) Resp:  [21-41] 27 (08/21 1211) BP: (97-151)/(41-100) 126/60 mmHg (08/21 1211) SpO2:  [94 %-100 %] 100 % (08/21 1211) FiO2 (%):  [28 %-50 %] 40 % (08/21 1211) Weight:  [267 lb 10.2 oz (121.4 kg)] 267 lb 10.2 oz (121.4 kg) (08/21 0500)   HEMODYNAMICS:    VENTILATOR SETTINGS: Vent Mode:  [-] PSV;CPAP FiO2 (%):  [28 %-50 %] 40 % PEEP:  [5 cmH20] 5 cmH20 Pressure Support:  [10 cmH20-12 cmH20] 12 cmH20  INTAKE / OUTPUT:  Intake/Output Summary (Last 24 hours) at 07/05/15 1414 Last data filed at 07/05/15 1200  Gross per 24 hour  Intake    550 ml  Output   2675 ml  Net  -2125 ml   PHYSICAL EXAMINATION: General: no resp distress, on vent  Neuro awake, following commands, still a bit confused, moves all ext Head: [place]/AT EENT: PERRL, no JVD noted, trach site clean, coughing up tan secretions  Cardiovascular: Regular Rate and rhythm. No MRG Lungs: Decreased breath sounds antr Abdomen: Soft, non-tender, surgical dressing in place Ext: 2+ edema  LABS:  CBC  Recent Labs Lab 07/02/15 0530 07/04/15 0400 07/05/15 0500  WBC 12.3* 8.8 8.2  HGB 11.2* 10.6* 10.3*  HCT 35.2* 33.7* 33.3*  PLT 357 351 358   Coag's  Recent Labs Lab 07/03/15 0416 07/04/15 0400 07/05/15 0500  APTT 31 27 28   INR 1.31 1.41 1.43   BMET  Recent Labs Lab 07/03/15 0416 07/04/15 0400 07/05/15 0500  NA  141 144 146*  K 3.4* 3.5 3.3*  CL 104 105 107  CO2 29 31 31   BUN 19 18 20   CREATININE 0.50* 0.54* 0.51*  GLUCOSE 115* 109* 118*   Electrolytes  Recent Labs Lab 06/29/15 0610  07/03/15 0416 07/04/15 0400 07/05/15 0500  CALCIUM 8.4*  8.3*  < > 8.2* 8.2* 8.2*  MG 1.9  1.9  --   --   --  1.7  PHOS 3.0  3.0  --   --   --  3.1  < > = values in this interval not displayed. Sepsis Markers  Recent Labs Lab 07/05/15 0500  LATICACIDVEN 1.7  PROCALCITON 0.31   ABG  Recent Labs Lab 06/28/15 1555 06/30/15 1640 06/30/15 1840  PHART 7.437 7.472* 7.413  PCO2ART 42.9 37.1 49.2*  PO2ART 76.8* 63.0* 337.0*   Liver Enzymes  Recent Labs Lab 06/29/15 0610  AST 29  ALT 16*  ALKPHOS 85  BILITOT 1.2  ALBUMIN 1.3*   Cardiac Enzymes No results for input(s): TROPONINI, PROBNP in the last 168 hours. Glucose  Recent Labs Lab 07/04/15 1615 07/04/15 2003 07/05/15 0102 07/05/15 0351 07/05/15 0757 07/05/15 1149  GLUCAP 117* 118* 121* 117* 116* 110*   Imaging Dg Chest Port 1 View  07/05/2015   CLINICAL DATA:  Panda tube stuck.  Initial encounter.  EXAM: PORTABLE CHEST - 1 VIEW  COMPARISON:  Chest radiograph performed 07/04/2015  FINDINGS: The patient's tracheostomy tube is seen ending 5 cm above the carina. A right PICC is noted likely extending into the right atrium, difficult to fully assess.  The patient's Panda tube tip is noted overlying the distal aspect of the tracheostomy tube. Would correlate clinically to assess whether it is lodged against the edge of the tracheostomy tube, whether in the trachea or esophagus.  The lungs are hypoexpanded. Vascular congestion is noted. Mild bibasilar opacities likely reflect mild interstitial edema, similar in appearance to the prior study.  The cardiomediastinal silhouette is enlarged. The patient is status post median sternotomy. No acute osseous abnormalities are identified.  IMPRESSION: 1. Panda tube tip noted overlying the distal  aspect of the tracheostomy tube. Would correlate clinically to assess whether it is lodged against the edge of the tracheostomy tube, whether in the trachea or esophagus. The Panda tube was subsequently removed. 2. Right PICC noted likely extending into the right atrium, not well assessed. 3. Lungs hypoexpanded. Vascular congestion and cardiomegaly. Mild bibasilar airspace opacities likely reflect mild interstitial edema, similar in appearance to the prior study.   Electronically Signed   By: Roanna Raider M.D.   On: 07/05/2015 01:44   I reviewed CXR from 8/21 >> cardiomegaly, BB atx    ASSESSMENT / PLAN:  PULMONARY ETT 8/4 >>8/8 ETT 8/16 >> 8/19 Trach 8/19 (Dr Pollyann Kennedy) >>   A: Acute resp failure in setting of septic shock with ischemic bowel  OSA Episode of resp distress 8/9 - ? Fluid overload vs aspiration Reintubated 8/16 largely for altered MS, agitation, then hypersomnolence with sedation P:   Appreciate Dr Lucky Rathke assistance, trach placed 8/19 Evaluate  for trach collar daytime  Continue with vent support At bedtime   HCO3 rising on chem , could consider full support at At bedtime   Diuretic management as below SLP eval swallow eval   CARDIOVASCULAR A: Septic shock on pressors Ischemic cardiomyopathy -H/O CABG on 7/16 P:  Norepi off since am 8/16 Diuresis restarted 8/16, continued at lower dose 8/17  RENAL A:  AKI, improved Hypomag Hypokalemia  P:   Replace K+  Follow BMP, on scheduled lasix Scheduled potassium added 8/20 Will likely need to decrease diuresis as he approaches even volume status (currently still 3L positive for the hosp)  GASTROINTESTINAL A:  s/p total colectomy for ischemic bowel S/p  ileostomy  P:   OFF TPN, to TF 8/20  Unable to get panda, IR referral for placement  Protonix for SUP.  HEMATOLOGIC A: Anemia Leukocytosis - improved P:  Transfusion only if HB <7.  INFECTIOUS A:  Septic shock from ischemic bowel. R LE ulcer, chronic Fever  spike 8/21 >check bc /sputum  P:   Abx: Ceftaz 8/4>> 8/18 Vanco 8/5>> 8/6 Flagyl 8/4>> 8/11 Diflucan 8/4>>8/11  Abx completed  x 14 days, rpt abd CT showed no abscess.  Right foot ulcer - wound care follow up  [facility] 8/21 >> Sputum cx 8/21 >>   ENDOCRINE A:  Hyperglycemia without h/o DM P:  SSI   NEUROLOGIC A: Pain, Anxiety  deconditioning P:   D/c versed  Fentanyl prn  GLOBAL:     FAMILY  - Updates: Reviewed status with the pt's sister and brother-in-law on 8/17. I mentioned to them the contributors to his resp failure. I have asked them to consider the benefits of tracheostomy to allow [place] to make more steady progress. Called pt's wife 8/18 > explained pt's status, slow progress. I do believe that he has a chance to recover from this, but clearly it is slowly. He will likely need intermittent nocturnal vent, will benefit from decreased sedation needs. I recommended trach and she understands and agrees.  8/21 updated family at bedside    Nejla Reasor NP-C  [facility]  [phone]

## 2015-07-06 ENCOUNTER — Inpatient Hospital Stay (HOSPITAL_COMMUNITY): Payer: Medicare Other

## 2015-07-06 DIAGNOSIS — R4182 Altered mental status, unspecified: Secondary | ICD-10-CM

## 2015-07-06 DIAGNOSIS — R29898 Other symptoms and signs involving the musculoskeletal system: Secondary | ICD-10-CM

## 2015-07-06 DIAGNOSIS — Z930 Tracheostomy status: Secondary | ICD-10-CM

## 2015-07-06 LAB — GLUCOSE, CAPILLARY
GLUCOSE-CAPILLARY: 101 mg/dL — AB (ref 65–99)
GLUCOSE-CAPILLARY: 102 mg/dL — AB (ref 65–99)
GLUCOSE-CAPILLARY: 96 mg/dL (ref 65–99)
GLUCOSE-CAPILLARY: 99 mg/dL (ref 65–99)
Glucose-Capillary: 96 mg/dL (ref 65–99)
Glucose-Capillary: 95 mg/dL (ref 65–99)

## 2015-07-06 LAB — BASIC METABOLIC PANEL
ANION GAP: 11 (ref 5–15)
BUN: 20 mg/dL (ref 6–20)
CHLORIDE: 106 mmol/L (ref 101–111)
CO2: 30 mmol/L (ref 22–32)
Calcium: 8.1 mg/dL — ABNORMAL LOW (ref 8.9–10.3)
Creatinine, Ser: 0.6 mg/dL — ABNORMAL LOW (ref 0.61–1.24)
GFR calc Af Amer: >60 mL/min (ref >60)
GLUCOSE: 109 mg/dL — AB (ref 65–99)
POTASSIUM: 3.3 mmol/L — AB (ref 3.5–5.1)
SODIUM: 147 mmol/L — AB (ref 135–145)

## 2015-07-06 LAB — CBC
HCT: 35.1 % — ABNORMAL LOW (ref 39.0–52.0)
HEMOGLOBIN: 10.9 g/dL — AB (ref 13.0–17.0)
MCH: 27.8 pg (ref 26.0–34.0)
MCHC: 31.1 g/dL (ref 30.0–36.0)
MCV: 89.5 fL (ref 78.0–100.0)
PLATELETS: 346 10*3/uL (ref 150–400)
RBC: 3.92 MIL/uL — AB (ref 4.22–5.81)
RDW: 16.3 % — ABNORMAL HIGH (ref 11.5–15.5)
WBC: 9.4 10*3/uL (ref 4.0–10.5)

## 2015-07-06 LAB — PROTIME-INR
INR: 1.51 — AB (ref 0.00–1.49)
Prothrombin Time: 18.2 seconds — ABNORMAL HIGH (ref 11.6–15.2)

## 2015-07-06 LAB — PROCALCITONIN: Procalcitonin: 0.23 ng/mL

## 2015-07-06 LAB — APTT: aPTT: 24 seconds (ref 24–37)

## 2015-07-06 MED ORDER — LIDOCAINE VISCOUS 2 % MT SOLN
OROMUCOSAL | Status: AC
  Filled 2015-07-06: qty 15

## 2015-07-06 MED ORDER — FUROSEMIDE 10 MG/ML IJ SOLN
40.0000 mg | Freq: Three times a day (TID) | INTRAMUSCULAR | Status: AC
  Administered 2015-07-06 (×2): 40 mg via INTRAVENOUS

## 2015-07-06 MED ORDER — ACETAZOLAMIDE SODIUM 500 MG IJ SOLR
250.0000 mg | Freq: Three times a day (TID) | INTRAMUSCULAR | Status: AC
  Administered 2015-07-06 (×2): 250 mg via INTRAVENOUS
  Filled 2015-07-06 (×2): qty 500

## 2015-07-06 MED ORDER — POTASSIUM CHLORIDE 10 MEQ/50ML IV SOLN
10.0000 meq | INTRAVENOUS | Status: AC
  Administered 2015-07-06 (×4): 10 meq via INTRAVENOUS
  Filled 2015-07-06: qty 50

## 2015-07-06 MED ORDER — FREE WATER: 250.0000 mL | Freq: Four times a day (QID) | Status: DC

## 2015-07-06 MED ORDER — METOLAZONE 5 MG PO TABS
5.0000 mg | ORAL_TABLET | Freq: Every day | ORAL | Status: AC
Start: 2015-07-06 — End: 2015-07-07

## 2015-07-06 NOTE — Care Management Important Message (Signed)
Important Message  Patient Details  Name: Jonathan Le MRN: [id number] Date of Birth: Mar 18, 1946   Medicare Important Message Given:  Yes-fourth notification given    Hanley Hays, RN 07/06/2015, 9:25 AM

## 2015-07-06 NOTE — Evaluation (Signed)
[facility] Speaking Valve - Evaluation Patient Details  Name: Jonathan Le MRN: [id number] Date of Birth: August 02, 1946  Today's Date: 07/06/2015 Time: 4098-1191 SLP Time Calculation (min) (ACUTE ONLY): 33 min  Past Medical History:  Past Medical History  Diagnosis Date  . Anxiety   . Hypertension   . Arthritis   . Sleep apnea     wears CPAP nightly  . Glaucoma   . CHF (congestive heart failure)     MI, with open heart surgery x 30 sdays ago per pt. (06/26/15)   Past Surgical History:  Past Surgical History  Procedure Laterality Date  . Hernia repair      right  . Eye surgery      bil cataract  . Wisdom tooth extraction    . Fasciotomy foot / toe Left     foot  . Cardiac catheterization N/A 05/20/2015    Procedure: Left Heart Cath and Coronary Angiography;  Surgeon: Kathleene Hazel, MD;  Location: [facility] INVASIVE CV LAB;  Service: Cardiovascular;  Laterality: N/A;  . Coronary artery bypass graft N/A 05/22/2015    Procedure: CORONARY ARTERY BYPASS GRAFTING (CABG), ON PUMP, TIMES THREE, USING LEFT INTERNAL MAMMARY ARTERY, RIGHT GREATER SAPHENOUS VEIN HARVESTED ENDOSCOPICALLY;  Surgeon: Kerin Perna, MD;  Location: [facility] OR;  Service: Open Heart Surgery;  Laterality: N/A;  LIMA-LAD, SVG-OM, SVG-PD  . Tee without cardioversion N/A 05/22/2015    Procedure: TRANSESOPHAGEAL ECHOCARDIOGRAM (TEE);  Surgeon: Kerin Perna, MD;  Location: [facility] OR;  Service: Open Heart Surgery;  Laterality: N/A;  . Laparotomy N/A 06/18/2015    Procedure: EXPLORATORY LAPAROTOMY;  Surgeon: Emelia Loron, MD;  Location: [facility] OR;  Service: General;  Laterality: N/A;  . Laparotomy N/A 06/20/2015    Procedure: WASHOUT OF ABDOMEN, CLOSURE OF ABDOMINAL WALL WITH PLACEMENT OF WOUND VAC AND ILLEOSTOMY ;  Surgeon: Almond Lint, MD;  Location: [facility] OR;  Service: General;  Laterality: N/A;  . Tracheostomy tube placement N/A 07/03/2015    Procedure: TRACHEOSTOMY;  Surgeon: Serena Colonel, MD;  Location: [facility] OR;  Service: ENT;   Laterality: N/A;   HPI:  69 year old male with PMH: OSA, HTN, anxiety CABG 05/2015 admitted after becoming diaphoretic and LOC. Pt found to have septic shock secondary to ischemic colon. Intubated 8/4-8/8 (self extubated), reintubated 8/16 due to possible aspiration and received trach 07/03/15 ( #8, cuff).     Assessment / Plan / Recommendation Clinical Impression  Cuff deflated without difficulty; strong cough able to expectorate thick secretions via trach. Excellent upper airway patency resulting in raspy vocal quality requiring mild cues for adequate lung volume prior to phonation. SpO2 88-94%, RR 23-33, HR 96. No back pressure when valve removed. SLP recommends use of valve with SLP, RN only with full supervision, ensure cuff deflation. ST will follow for PMSV and swallow if continues to tolerate PMSV.    SLP Assessment  Patient needs continued Speech Lanaguage Pathology Services    Follow Up Recommendations   (TBD)    Frequency and Duration min 2x/week  2 weeks   Pertinent Vitals/Pain none    SLP Goals Potential to Achieve Goals (ACUTE ONLY): Good   PMSV Trial  PMSV was placed for: approx 20-25 min Able to redirect subglottic air through upper airway: Yes Able to Attain Phonation: Yes Voice Quality: Low vocal intensity (raspy) Able to Expectorate Secretions: Yes Level of Secretion Expectoration with PMSV: Tracheal Breath Support for Phonation: Mildly decreased Intelligibility: Intelligibility reduced Word: 75-100% accurate Phrase: 75-100% accurate Sentence: 75-100%  accurate Conversation: 75-100% accurate Respirations During Trial:  (23-33) SpO2 During Trial:  (88-94) Pulse During Trial:  (96) Behavior: Alert;Controlled;Cooperative;Expresses self well;Responsive to questions   Tracheostomy Tube       Vent Dependency  FiO2 (%): 28 %    Cuff Deflation Trial Tolerated Cuff Deflation: Yes Length of Time for Cuff Deflation Trial:  (30 min) Behavior:  Alert;Controlled;Cooperative;Responsive to questions   Royce Macadamia 07/06/2015, 2:50 PM  Breck Coons Lonell Face.Ed [employer] [phone]

## 2015-07-06 NOTE — Significant Event (Signed)
RN received report this morning that patient is going for a DHT placement under fluoro-however, RN did not see order for it. RN called down to IR to see if he is in their list-received confirmation that he is not on their list and that they need an order if he is to have DHT under fluoro. RN called Dr. Molli Knock and received an order for it. Called down to IR after placing order-staff there stated they will be able to see him this afternoon. Patient currently being seen by speech therapy for PMV evaluation and physical therapists for physical therapy.  Gianfranco Araki, Charity fundraiser

## 2015-07-06 NOTE — Progress Notes (Signed)
Nutrition Follow-up  DOCUMENTATION CODES:   Morbid obesity  INTERVENTION:   Once TF is able to be resumed, recommend:  Initiate Vital AF 1.2 @ 20 ml/hr via Panda and increase by 10 ml every 4 hours to goal rate of 80 ml/hr.   Tube feeding regimen provides 2304 kcal (100% of needs), 144 grams of protein, and 1557 ml of H2O.   NUTRITION DIAGNOSIS:   Inadequate oral intake related to inability to eat as evidenced by NPO status.  Ongoing  GOAL:   Patient will meet greater than or equal to 90% of their needs  Unmet  MONITOR:   Vent status, Weight trends, TF tolerance, Labs, Skin, I & O's  REASON FOR ASSESSMENT:   Consult Enteral/tube feeding initiation and management  ASSESSMENT:   [name] s/p CABG 7/8 presented to ED 8/4 after losing consciousness on toilet. In ED he was hypoxic requiring NRB and hypotensive. PCCM called for admission.   S/p Procedure(s) on 07/03/15: TRACHEOSTOMY  Pt on trach collar at time of visit. Per staff, pt is requiring intermittent vent at night.   Abdominal vac was d/c on 07/02/15. Pt has been transitioned to wet to dry dressings. Reviewed wound documentation: pt with closed abdominal incision, stage I pressure ulcer on coccyx, DTI to lt nose, and open rt foot wound.   S/p PICC line placement on 07/02/15.   Pt remains with rt RLQ ileostomy. Noted 100 ml output within the past 24 hours per doc flowsheets.   TF held due to pt with no enteral access (pulled out [place] on 07/05/15). Plan is to replace Panda later on today per IR.   Labs reviewed: Na: 147, K: 3.3 (on IV replacement).  Diet Order:   NPO  Skin:  Wound (see comment) (closed abdominal incision, st I coccyx, DTI lt nose, rt foot)  Last BM:  07/06/15  Height:   Ht Readings from Last 1 Encounters:  06/30/15  (1.753 m)    Weight:   Wt Readings from Last 1 Encounters:  07/06/15 267 lb 3.2 oz (121.2 kg)    Ideal Body Weight:  72.7 kg  BMI:  Body mass index is 39.44  kg/(m^2).  Estimated Nutritional Needs:   Kcal:  2200-2400  Protein:  130-145 grams  Fluid:  per MD  EDUCATION NEEDS:   No education needs identified at this time  Aubreyana Saltz A. Mayford Knife, RD, LDN, CDE Pager: [phone] After hours Pager: [phone]

## 2015-07-06 NOTE — Progress Notes (Addendum)
PULMONARY / CRITICAL CARE MEDICINE   Name: HELEN CUFF MRN: [id number] DOB: 04-Apr-1946    ADMISSION DATE:  06/18/2015 CONSULTATION DATE: 06/18/15   CHIEF COMPLAINT:  Septic shock secondary to ischemic colon. Acute respiratiory failure. Acute renal failure.  INITIAL PRESENTATION: 69 year old male with PMH of OSA and HTN. 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 Post discharge course complicated by R leg cellulitis after endoscopic vein harvest he was seen by Dr. Donata Clay in clinic for this,was having some constipation. 8/4 PM he took an enema and was straining in bathroom. He became diaphoretic and had loss of consciousness. In ED he was noted to be profoundly hypotensive with SOB. Found to be in severe septic shock with ischemic colon s/p total abd colectomy on 8/4  STUDIES:  Echo (8/4) estimated ejection fraction was in the range of 25% to 30%. Severe hypokinesis of the anteroseptal and anterior myocardium.Dyskinesis of the apical myocardium. .  CT chest abd pelvis (8/4) CTA CHEST: Limited CTA of the chest, poor bolus timing. Moderate to severe cardiomegaly with small pericardial effusion.Evidence of pulmonary edema with small pleural effusions and, patchy airspace opacities most consistent with confluent edema.  CT ABDOMEN AND PELVIS: Focal colonic hepatic flexure pneumatosis without bowel perforation. Moderate to large amount of distal retained large bowel stool. Mild probable ileus, no bowel obstruction.  Bilateral nonobstructing nephrolithiasis measuring up to 4 mm.  CT abdomen 8/12 >> no evidence intra-abd abscess, ascites present, GB sludge, small B pleural effusions.   SIGNIFICANT EVENTS: 8/4- Total abdominal colectomy with abdominal vac placement 8/6 Exploratory laparotomy, removal of  prior packs, closure of open abdomen, ileostomy 8/7 high pressors 8/8 self extubated 8/9  resp distress , required bipap , lasix 8/13 had to restart levophed for shock overnight. 8/16 reintubated, primarily for MS, agitation, tachypnea 8/19 Trach placed 8/22 tolerating TC.  SUBJECTIVE:   Pulled out NGT over the weekend, RN unable to replace, IR to replace today.   VITAL SIGNS: Temp:  [98.6 F (37 C)-101 F (38.3 C)] 98.6 F (37 C) (08/22 0731) Pulse Rate:  [46-111] 100 (08/22 1000) Resp:  [17-27] 22 (08/22 1000) BP: (93-145)/(49-78) 126/58 mmHg (08/22 1000) SpO2:  [92 %-100 %] 100 % (08/22 1000) FiO2 (%):  [35 %-40 %] 35 % (08/22 1000) Weight:  [121.2 kg (267 lb 3.2 oz)] 121.2 kg (267 lb 3.2 oz) (08/22 0500)   HEMODYNAMICS:    Filed Vitals:   07/06/15 1000  BP: 126/58  Pulse: 100  Temp:   Resp: 22   Sat 100% on 35% FiO2.  VENTILATOR SETTINGS: Vent Mode:  [-] PRVC FiO2 (%):  [35 %-40 %] 35 % Set Rate:  [16 bmp] 16 bmp Vt Set:  [450 mL] 450 mL PEEP:  [5 cmH20] 5 cmH20 Pressure Support:  [12 cmH20] 12 cmH20 Plateau Pressure:  [13 cmH20-17 cmH20] 16 cmH20  INTAKE / OUTPUT:  Intake/Output Summary (Last 24 hours) at 07/06/15 1122 Last data filed at 07/06/15 1000  Gross per 24 hour  Intake    200 ml  Output   2800 ml  Net  -2600 ml   PHYSICAL EXAMINATION: General: no resp distress, on vent  Neuro awake, following commands, still confused, moves all ext Head: Smiley/AT EENT: PERRL, no JVD noted, trach site clean, coughing up tan secretions  Cardiovascular: Regular Rate and rhythm. No MRG Lungs: Decreased breath sounds antr Abdomen: Soft, non-tender, surgical dressing in place Ext: 2+ edema  LABS:  CBC  Recent Labs Lab 07/04/15 0400 07/05/15 0500 07/06/15 0600  WBC 8.8 8.2 9.4  HGB 10.6* 10.3* 10.9*  HCT 33.7* 33.3* 35.1*  PLT 351 358 346   Coag's  Recent Labs Lab 07/04/15 0400 07/05/15 0500 07/06/15 0600  APTT 27 28 24   INR 1.41 1.43 1.51*    BMET  Recent Labs Lab 07/04/15 0400 07/05/15 0500 07/06/15 0600  NA 144 146* 147*  K 3.5 3.3* 3.3*  CL 105 107 106  CO2 31 31 30   BUN 18 20 20   CREATININE 0.54* 0.51* 0.60*  GLUCOSE 109* 118* 109*   Electrolytes  Recent Labs Lab 07/04/15 0400 07/05/15 0500 07/06/15 0600  CALCIUM 8.2* 8.2* 8.1*  MG  --  1.7  --   PHOS  --  3.1  --    Sepsis Markers  Recent Labs Lab 07/05/15 0500 07/06/15 0600  LATICACIDVEN 1.7  --   PROCALCITON 0.31 0.23   ABG  Recent Labs Lab 06/30/15 1640 06/30/15 1840  PHART 7.472* 7.413  PCO2ART 37.1 49.2*  PO2ART 63.0* 337.0*   Liver Enzymes No results for input(s): AST, ALT, ALKPHOS, BILITOT, ALBUMIN in the last 168 hours. Cardiac Enzymes No results for input(s): TROPONINI, PROBNP in the last 168 hours. Glucose  Recent Labs Lab 07/05/15 1149 07/05/15 1659 07/05/15 2129 07/05/15 2333 07/06/15 0454 07/06/15 0735  GLUCAP 110* 105* 97 102* 101* 99   Imaging Dg Chest Port 1 View  07/06/2015   CLINICAL DATA:  Respiratory failure.  EXAM: PORTABLE CHEST - 1 VIEW  COMPARISON:  07/05/2015.  FINDINGS: Interval removal of feeding tube. Tracheostomy tube, right PICC line in stable position. Bibasilar subsegmental atelectasis. No pleural effusion or pneumothorax. Prior CABG. Cardiomegaly. No acute bony abnormality.  IMPRESSION: 1. Interval removal of feeding tube. Tracheostomy tube and right PICC line stable position.  2. Prior CABG. Cardiomegaly. No evidence of overt congestive heart failure on today's exam.  3.  Low lung volumes with bibasilar subsegmental atelectasis.   Electronically Signed   By: Maisie Fus  Register   On: 07/06/2015 07:47   I reviewed CXR from 8/21 myself>> cardiomegaly, BB atx   ASSESSMENT / PLAN:  PULMONARY ETT 8/4 >>8/8 ETT 8/16 >> 8/19 Trach 8/19 (Dr Pollyann Kennedy) >>   A: Acute resp failure in setting of septic shock with ischemic bowel  OSA Episode of resp distress 8/9 - ? Fluid overload vs aspiration Reintubated  8/16 largely for altered MS, agitation, then hypersomnolence with sedation P:   Appreciate Dr Lucky Rathke assistance, trach placed 8/19 Continue with vent support At bedtime tonight but push for TC during. HCO3 rising on chem due to diureses, will add diamox. Diuretic management as below. SLP eval swallow eval.  CARDIOVASCULAR A: Septic shock on pressors Ischemic cardiomyopathy -H/O CABG on 7/16 P:  Norepi off since am 8/16, d/c from EMR Diureses as below.  RENAL A:  AKI, improved Hypomag Hypokalemia  Elevated bicarb. P:   Replace electrolytes as ordered.  Follow BMP daily. D/C  scheduled potassium Lasix 40 mg IV q8 x2 doses. Diamox 250 q8 x2 doses. Zaroxolyn 5 mg PO x1.  GASTROINTESTINAL A:  s/p total colectomy for ischemic bowel S/p  ileostomy  P:   OFF TPN, to TF 8/20. Unable to get panda, IR referral for placement hopefully today. Protonix for SUP. Swallow evaluation as ordered.  HEMATOLOGIC A: Anemia  Leukocytosis - improved P:  Transfusion only if HB <7.  INFECTIOUS A:  Septic shock from ischemic bowel. R LE ulcer, chronic Fever spike 8/21 >check bc /sputum  P:   Abx: Ceftaz 8/4>> 8/18 Vanco 8/5>> 8/6 Flagyl 8/4>> 8/11 Diflucan 8/4>>8/11  Abx completed  x 14 days, rpt abd CT showed no abscess.  Right foot ulcer - wound care follow up  [facility] 8/21 >> Sputum cx 8/21 >>  ENDOCRINE A:  Hyperglycemia without h/o DM P:  SSI   NEUROLOGIC A: Pain, Anxiety  deconditioning P:   D/c versed  Fentanyl prn PT evaluation and treatment. OOB as able.  Discussed with bedside RN.  Alyson Reedy, M.D. [facility] Pulmonary/Critical Care Medicine. Pager: [phone]. After hours pager: [phone].

## 2015-07-06 NOTE — Progress Notes (Signed)
Patient ID: Jonathan Le, male   DOB: 1946/02/19, 69 y.o.   MRN: [id number] 3 Days Post-Op  Subjective: Pt trying desperately to hold a conversation despite being on the vent.  He can barely phonate and I am able to understand some.  He wants to know why he can't use his own CPAP.  PANDA was pulled out over weekend  Objective: Vital signs in last 24 hours: Temp:  [98.6 F (37 C)-101 F (38.3 C)] 98.6 F (37 C) (08/22 0731) Pulse Rate:  [46-112] 94 (08/22 0800) Resp:  [17-35] 20 (08/22 0800) BP: (93-145)/(42-78) 93/62 mmHg (08/22 0800) SpO2:  [92 %-100 %] 100 % (08/22 0800) FiO2 (%):  [40 %] 40 % (08/22 0800) Weight:  [121.2 kg (267 lb 3.2 oz)] 121.2 kg (267 lb 3.2 oz) (08/22 0500) Last BM Date: 07/05/15  Intake/Output from previous day: 08/21 0701 - 08/22 0700 In: 200 [IV Piggyback:200] Out: 2300 [Urine:2050; Stool:250] Intake/Output this shift:    PE: Abd: soft, incision is clean and packed.  Good granulation tissue, ileostomy with good enteric output and air.  Lab Results:   Recent Labs  07/05/15 0500 07/06/15 0600  WBC 8.2 9.4  HGB 10.3* 10.9*  HCT 33.3* 35.1*  PLT 358 346   BMET  Recent Labs  07/05/15 0500 07/06/15 0600  NA 146* 147*  K 3.3* 3.3*  CL 107 106  CO2 31 30  GLUCOSE 118* 109*  BUN 20 20  CREATININE 0.51* 0.60*  CALCIUM 8.2* 8.1*   PT/INR  Recent Labs  07/05/15 0500 07/06/15 0600  LABPROT 17.5* 18.2*  INR 1.43 1.51*   CMP     Component Value Date/Time   NA 147* 07/06/2015 0600   K 3.3* 07/06/2015 0600   CL 106 07/06/2015 0600   CO2 30 07/06/2015 0600   GLUCOSE 109* 07/06/2015 0600   BUN 20 07/06/2015 0600   CREATININE 0.60* 07/06/2015 0600   CALCIUM 8.1* 07/06/2015 0600   PROT 5.1* 06/29/2015 0610   ALBUMIN 1.3* 06/29/2015 0610   AST 29 06/29/2015 0610   ALT 16* 06/29/2015 0610   ALKPHOS 85 06/29/2015 0610   BILITOT 1.2 06/29/2015 0610   GFRNONAA >60 07/06/2015 0600   GFRAA >60 07/06/2015 0600   Lipase     Component  Value Date/Time   LIPASE 15* 05/19/2015 2140       Studies/Results: Dg Chest Port 1 View  07/06/2015   CLINICAL DATA:  Respiratory failure.  EXAM: PORTABLE CHEST - 1 VIEW  COMPARISON:  07/05/2015.  FINDINGS: Interval removal of feeding tube. Tracheostomy tube, right PICC line in stable position. Bibasilar subsegmental atelectasis. No pleural effusion or pneumothorax. Prior CABG. Cardiomegaly. No acute bony abnormality.  IMPRESSION: 1. Interval removal of feeding tube. Tracheostomy tube and right PICC line stable position.  2. Prior CABG. Cardiomegaly. No evidence of overt congestive heart failure on today's exam.  3.  Low lung volumes with bibasilar subsegmental atelectasis.   Electronically Signed   By: Maisie Fus  Register   On: 07/06/2015 07:47   Dg Chest Port 1 View  07/05/2015   CLINICAL DATA:  Panda tube stuck.  Initial encounter.  EXAM: PORTABLE CHEST - 1 VIEW  COMPARISON:  Chest radiograph performed 07/04/2015  FINDINGS: The patient's tracheostomy tube is seen ending 5 cm above the carina. A right PICC is noted likely extending into the right atrium, difficult to fully assess.  The patient's Panda tube tip is noted overlying the distal aspect of the tracheostomy tube. Would correlate clinically  to assess whether it is lodged against the edge of the tracheostomy tube, whether in the trachea or esophagus.  The lungs are hypoexpanded. Vascular congestion is noted. Mild bibasilar opacities likely reflect mild interstitial edema, similar in appearance to the prior study.  The cardiomediastinal silhouette is enlarged. The patient is status post median sternotomy. No acute osseous abnormalities are identified.  IMPRESSION: 1. Panda tube tip noted overlying the distal aspect of the tracheostomy tube. Would correlate clinically to assess whether it is lodged against the edge of the tracheostomy tube, whether in the trachea or esophagus. The Panda tube was subsequently removed. 2. Right PICC noted likely  extending into the right atrium, not well assessed. 3. Lungs hypoexpanded. Vascular congestion and cardiomegaly. Mild bibasilar airspace opacities likely reflect mild interstitial edema, similar in appearance to the prior study.   Electronically Signed   By: Roanna Raider M.D.   On: 07/05/2015 01:44    Anti-infectives: Anti-infectives    Start     Dose/Rate Route Frequency Ordered Stop   06/20/15 2200  cefTAZidime (FORTAZ) 2 g in dextrose 5 % 50 mL IVPB     2 g 100 mL/hr over 30 Minutes Intravenous 3 times per day 06/20/15 1537 07/02/15 0555   06/20/15 0200  vancomycin (VANCOCIN) IVPB 1000 mg/200 mL premix  Status:  Discontinued     1,000 mg 200 mL/hr over 60 Minutes Intravenous Every 12 hours 06/19/15 1512 06/20/15 0737   06/19/15 1200  vancomycin (VANCOCIN) 1,750 mg in sodium chloride 0.9 % 500 mL IVPB  Status:  Discontinued     1,750 mg 250 mL/hr over 120 Minutes Intravenous Every 24 hours 06/18/15 1030 06/19/15 1512   06/18/15 1200  fluconazole (DIFLUCAN) IVPB 400 mg  Status:  Discontinued     400 mg 100 mL/hr over 120 Minutes Intravenous Every 24 hours 06/18/15 1050 06/25/15 0925   06/18/15 1130  vancomycin (VANCOCIN) 2,500 mg in sodium chloride 0.9 % 500 mL IVPB     2,500 mg 250 mL/hr over 120 Minutes Intravenous  Once 06/18/15 1030 06/18/15 1314   06/18/15 1130  cefTAZidime (FORTAZ) 2 g in dextrose 5 % 50 mL IVPB  Status:  Discontinued     2 g 100 mL/hr over 30 Minutes Intravenous Every 12 hours 06/18/15 1030 06/20/15 1537   06/18/15 1030  metroNIDAZOLE (FLAGYL) IVPB 500 mg  Status:  Discontinued     500 mg 100 mL/hr over 60 Minutes Intravenous Every 8 hours 06/18/15 1000 06/25/15 0925       Assessment/Plan POD 18,16, s/p ex lap with subtotal colectomy, open abdomen, take back with creation of ileostomy and closure -trached, on vent right now -Feeding tube pulled out over the weekend.  Trying to get that replaced today so they can restart TFs -patient is surgically  stable -BID WD dressing changes  -routine ostomy care -will see patient twice a week, please call if needed in the interim. DVT prophylaxis -SCDs/Heparin    LOS: 18 days    Amberlie Gaillard E 07/06/2015, 8:49 AM Pager: [phone]

## 2015-07-06 NOTE — Significant Event (Signed)
Potassium this AM was 3.3. Spoke with Dr. Larae Grooms order for 4 runs.

## 2015-07-06 NOTE — Significant Event (Signed)
Patient currently tolerating PMV. Speech Misty Stanley stated that they will do swallowing test tomorrow. Speech and RN relay this to Dr. Molli Knock, who stated that patient can wait for DHT placement and will do swallowing test by Speech tomorrow.

## 2015-07-06 NOTE — Progress Notes (Signed)
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 Patient in a very pleasant mood due to Passy-Muir Valve placement. Patient was able to transfer as described below. See Vitals below to see pertinent vitals during session. Patient will benefit from continued PT to improve mobility as patient progresses medically. Patient very eager to progress and improve.   Follow Up Recommendations  CIR;LTACH (CIR if off overnight vent, LTACH if still on vent.)     Equipment Recommendations  Other (comment) (TBA)    Recommendations for Other Services       Precautions / Restrictions Precautions Precautions: Fall Precaution Comments: SLP wanted RR to remain <35 throughout mobility. Restrictions Weight Bearing Restrictions: No    Mobility  Bed Mobility Overal bed mobility: Needs Assistance;+2 for physical assistance Bed Mobility: Supine to Sit;Sit to Supine     Supine to sit: Mod assist;HOB elevated;+2 for safety/equipment Sit to supine: Max assist;+2 for physical assistance   General bed mobility comments: Patient able to assist in pulling himself to sitting, required management of lines and tubes. Patient required max A x1 for bil LE for sit to supine transfer and max A x2 for repositioning in the bed at end of session.  Transfers Overall transfer level: Needs assistance   Transfers: Sit to/from Stand Sit to Stand: Total assist;From elevated surface         General transfer comment: Patient able to perform 4 sit to/from stand with tactile cues for hip extension while using the [facility] Plus lift.  Ambulation/Gait                 Stairs            Wheelchair Mobility     Modified Rankin (Stroke Patients Only)       Balance Overall balance assessment: Needs assistance Sitting-balance support: Bilateral upper extremity supported;Feet supported Sitting balance-Leahy Scale: Fair Sitting balance - Comments: Able to maintain upright posture and pick up feet while holding onto bed rail.   Standing balance support: Bilateral upper extremity supported   Standing balance comment: [facility] Plus standing lift used.                    Cognition Arousal/Alertness: Awake/alert Behavior During Therapy: WFL for tasks assessed/performed Overall Cognitive Status: Within Functional Limits for tasks assessed                 General Comments: Patient able to communicate effectively with P-M valve    Exercises      General Comments        Pertinent Vitals/Pain Pain Assessment: No/denies pain  RR during transfers and sit to stands: 16-30 breathes, generally maintained 25 Sats throughout session: 90-95%    Home Living                      Prior Function            PT Goals (current goals can now be found in the care plan section) Acute Rehab PT Goals 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 PT Goal Formulation: With patient Time For Goal Achievement: 07/14/15 Potential to Achieve Goals: Good Progress towards  PT goals: Progressing toward goals    Frequency  Min 3X/week    PT Plan Current plan remains appropriate    Co-evaluation             End of Session   Activity Tolerance: Patient limited by fatigue Patient left: in bed;with call bell/phone within reach;with bed alarm set     Time: 1430-1518 PT Time Calculation (min) (ACUTE ONLY): 48 min  Charges:  $Therapeutic Activity: 38-52 mins                    G CodesMichele Rockers, SPT [phone] 07/06/2015, 4:25 PM  I have read, reviewed and agree with student's note.   [facility] Acute  Rehabilitation [phone] (pager)

## 2015-07-07 ENCOUNTER — Inpatient Hospital Stay (HOSPITAL_COMMUNITY): Payer: Medicare Other

## 2015-07-07 DIAGNOSIS — R5381 Other malaise: Secondary | ICD-10-CM

## 2015-07-07 DIAGNOSIS — G9341 Metabolic encephalopathy: Secondary | ICD-10-CM

## 2015-07-07 LAB — BASIC METABOLIC PANEL
ANION GAP: 8 (ref 5–15)
BUN: 21 mg/dL — AB (ref 6–20)
CALCIUM: 8.3 mg/dL — AB (ref 8.9–10.3)
CO2: 31 mmol/L (ref 22–32)
Chloride: 111 mmol/L (ref 101–111)
Creatinine, Ser: 0.66 mg/dL (ref 0.61–1.24)
GFR calc Af Amer: >60 mL/min (ref >60)
GLUCOSE: 101 mg/dL — AB (ref 65–99)
POTASSIUM: 3.4 mmol/L — AB (ref 3.5–5.1)
Sodium: 150 mmol/L — ABNORMAL HIGH (ref 135–145)

## 2015-07-07 LAB — GLUCOSE, CAPILLARY
GLUCOSE-CAPILLARY: 100 mg/dL — AB (ref 65–99)
GLUCOSE-CAPILLARY: 96 mg/dL (ref 65–99)
Glucose-Capillary: 95 mg/dL (ref 65–99)
Glucose-Capillary: 92 mg/dL (ref 65–99)
Glucose-Capillary: 104 mg/dL — ABNORMAL HIGH (ref 65–99)
Glucose-Capillary: 118 mg/dL — ABNORMAL HIGH (ref 65–99)

## 2015-07-07 LAB — PROCALCITONIN: Procalcitonin: 0.24 ng/mL

## 2015-07-07 LAB — CBC
HEMATOCRIT: 36.6 % — AB (ref 39.0–52.0)
Hemoglobin: 11.2 g/dL — ABNORMAL LOW (ref 13.0–17.0)
MCH: 28.1 pg (ref 26.0–34.0)
MCHC: 30.6 g/dL (ref 30.0–36.0)
MCV: 91.7 fL (ref 78.0–100.0)
PLATELETS: 206 10*3/uL (ref 150–400)
RBC: 3.99 MIL/uL — ABNORMAL LOW (ref 4.22–5.81)
RDW: 16.1 % — AB (ref 11.5–15.5)
WBC: 8 10*3/uL (ref 4.0–10.5)

## 2015-07-07 LAB — APTT: aPTT: 26 seconds (ref 24–37)

## 2015-07-07 LAB — PROTIME-INR
INR: 1.48 (ref 0.00–1.49)
Prothrombin Time: 18 seconds — ABNORMAL HIGH (ref 11.6–15.2)

## 2015-07-07 LAB — PHOSPHORUS: Phosphorus: 3.3 mg/dL (ref 2.5–4.6)

## 2015-07-07 LAB — CULTURE, BLOOD (SINGLE): Culture: NO GROWTH

## 2015-07-07 LAB — MAGNESIUM: Magnesium: 1.8 mg/dL (ref 1.7–2.4)

## 2015-07-07 LAB — MRSA PCR SCREENING: MRSA by PCR: NEGATIVE

## 2015-07-07 MED ORDER — RESOURCE THICKENUP CLEAR PO POWD
ORAL | Status: DC | PRN
  Filled 2015-07-07: qty 125

## 2015-07-07 MED ORDER — MAGNESIUM SULFATE 2 GM/50ML IV SOLN
2.0000 g | Freq: Once | INTRAVENOUS | Status: AC
  Administered 2015-07-07: 2 g via INTRAVENOUS
  Filled 2015-07-07: qty 50

## 2015-07-07 MED ORDER — ACETAZOLAMIDE SODIUM 500 MG IJ SOLR
250.0000 mg | Freq: Three times a day (TID) | INTRAMUSCULAR | Status: AC
  Administered 2015-07-07 (×2): 250 mg via INTRAVENOUS
  Filled 2015-07-07 (×2): qty 500

## 2015-07-07 NOTE — Progress Notes (Addendum)
PULMONARY / CRITICAL CARE MEDICINE   Name: Jonathan Le MRN: [id number] DOB: 12-14-1945    ADMISSION DATE:  06/18/2015 CONSULTATION DATE: 06/18/15   CHIEF COMPLAINT:  Septic shock secondary to ischemic colon. Acute respiratiory failure. Acute renal failure.  INITIAL PRESENTATION: 69 year old male with PMH of OSA and HTN. 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 Post discharge course complicated by R leg cellulitis after endoscopic vein harvest he was seen by Dr. Donata Clay in clinic for this,was having some constipation. 8/4 PM he took an enema and was straining in bathroom. He became diaphoretic and had loss of consciousness. In ED he was noted to be profoundly hypotensive with SOB. Found to be in severe septic shock with ischemic colon s/p total abd colectomy on 8/4  STUDIES:  Echo (8/4) estimated ejection fraction was in the range of 25% to 30%. Severe hypokinesis of the anteroseptal and anterior myocardium.Dyskinesis of the apical myocardium. .  CT chest abd pelvis (8/4) CTA CHEST: Limited CTA of the chest, poor bolus timing. Moderate to severe cardiomegaly with small pericardial effusion.Evidence of pulmonary edema with small pleural effusions and, patchy airspace opacities most consistent with confluent edema.  CT ABDOMEN AND PELVIS: Focal colonic hepatic flexure pneumatosis without bowel perforation. Moderate to large amount of distal retained large bowel stool. Mild probable ileus, no bowel obstruction.  Bilateral nonobstructing nephrolithiasis measuring up to 4 mm.  CT abdomen 8/12 >> no evidence intra-abd abscess, ascites present, GB sludge, small B pleural effusions.   SIGNIFICANT EVENTS: 8/4- Total abdominal colectomy with abdominal vac placement 8/6 Exploratory laparotomy, removal of  prior packs, closure of open abdomen, ileostomy 8/7 high pressors 8/8 self extubated 8/9  resp distress , required bipap , lasix 8/13 had to restart levophed for shock overnight. 8/16 reintubated, primarily for MS, agitation, tachypnea 8/19 Trach placed 8/22 tolerating TC.  SUBJECTIVE:   Pulled out NGT over the weekend, RN unable to replace, IR to replace today.   VITAL SIGNS: Temp:  [97.6 F (36.4 C)-100 F (37.8 C)] 98.6 F (37 C) (08/23 1100) Pulse Rate:  [71-103] 71 (08/23 1100) Resp:  [11-38] 20 (08/23 1100) BP: (100-141)/(44-79) 141/74 mmHg (08/23 1100) SpO2:  [90 %-99 %] 97 % (08/23 1100) FiO2 (%):  [28 %-40 %] 40 % (08/23 0837) Weight:  [118.8 kg (261 lb 14.5 oz)] 118.8 kg (261 lb 14.5 oz) (08/23 0600)   HEMODYNAMICS:    Filed Vitals:   07/07/15 1100  BP: 141/74  Pulse: 71  Temp: 98.6 F (37 C)  Resp: 20   Sat 100% on 35% FiO2.  VENTILATOR SETTINGS: Vent Mode:  [-] PRVC FiO2 (%):  [28 %-40 %] 40 % Set Rate:  [16 bmp] 16 bmp Vt Set:  [450 mL] 450 mL PEEP:  [5 cmH20] 5 cmH20 Plateau Pressure:  [15 cmH20-16 cmH20] 15 cmH20  INTAKE / OUTPUT:  Intake/Output Summary (Last 24 hours) at 07/07/15 1247 Last data filed at 07/07/15 1200  Gross per 24 hour  Intake    343 ml  Output   2410 ml  Net  -2067 ml   PHYSICAL EXAMINATION: General: no resp distress, on vent  Neuro awake, following commands, still confused, moves all ext Head: [place]/AT EENT: PERRL, no JVD noted, trach site clean, coughing up tan secretions  Cardiovascular: Regular Rate and  rhythm. No MRG Lungs: Decreased breath sounds antr Abdomen: Soft, non-tender, surgical dressing in place Ext: 2+ edema  LABS:  CBC  Recent Labs Lab 07/05/15 0500 07/06/15 0600 07/07/15 0550  WBC 8.2 9.4 8.0  HGB 10.3* 10.9* 11.2*  HCT 33.3* 35.1* 36.6*  PLT 358 346 206   Coag's  Recent Labs Lab 07/05/15 0500 07/06/15 0600 07/07/15 1003  APTT 28 24 26   INR 1.43 1.51* 1.48   BMET  Recent  Labs Lab 07/05/15 0500 07/06/15 0600 07/07/15 0550  NA 146* 147* 150*  K 3.3* 3.3* 3.4*  CL 107 106 111  CO2 31 30 31   BUN 20 20 21*  CREATININE 0.51* 0.60* 0.66  GLUCOSE 118* 109* 101*   Electrolytes  Recent Labs Lab 07/05/15 0500 07/06/15 0600 07/07/15 0550  CALCIUM 8.2* 8.1* 8.3*  MG 1.7  --  1.8  PHOS 3.1  --  3.3   Sepsis Markers  Recent Labs Lab 07/05/15 0500 07/06/15 0600 07/07/15 0550  LATICACIDVEN 1.7  --   --   PROCALCITON 0.31 0.23 0.24   ABG  Recent Labs Lab 06/30/15 1640 06/30/15 1840  PHART 7.472* 7.413  PCO2ART 37.1 49.2*  PO2ART 63.0* 337.0*   Liver Enzymes No results for input(s): AST, ALT, ALKPHOS, BILITOT, ALBUMIN in the last 168 hours. Cardiac Enzymes No results for input(s): TROPONINI, PROBNP in the last 168 hours. Glucose  Recent Labs Lab 07/06/15 1724 07/06/15 2035 07/06/15 2359 07/07/15 0313 07/07/15 0837 07/07/15 1157  GLUCAP 95 96 96 100* 95 92   Imaging Dg Chest Port 1 View  07/07/2015   CLINICAL DATA:  Shortness of breath.  EXAM: PORTABLE CHEST - 1 VIEW  COMPARISON:  07/06/2015.  FINDINGS: Tracheostomy tube and right PICC line stable position. Prior CABG. Cardiomegaly with mild pulmonary vascular prominence. Bibasilar atelectasis and/or infiltrates are noted. No pleural effusion or pneumothorax.  IMPRESSION: 1. Tracheostomy tube and right PICC line in stable position. 2. Prior CABG. Cardiomegaly with mild pulmonary vascular prominence. 3. Low lung volumes with bibasilar subsegmental atelectasis and/or infiltrates. A component of mild bibasilar pneumonia and/or pulmonary edema cannot be excluded .   Electronically Signed   By: Maisie Fus  Register   On: 07/07/2015 07:11   I reviewed CXR from 8/21 myself>> cardiomegaly, BB atx   ASSESSMENT / PLAN:  PULMONARY ETT 8/4 >>8/8 ETT 8/16 >> 8/19 Trach 8/19 (Dr Pollyann Kennedy) >>   A: Acute resp failure in setting of septic shock with ischemic bowel  OSA Episode of resp distress 8/9 - ?  Fluid overload vs aspiration Reintubated 8/16 largely for altered MS, agitation, then hypersomnolence with sedation P:   Appreciate Dr Lucky Rathke assistance, trach placed 8/19 D/C vent. TC 24-7 at this point. HCO3 rising on chem due to diureses, will add diamox. Diuretic management as below. SLP eval swallow eval ok to use PMV.  CARDIOVASCULAR A: Septic shock on pressors Ischemic cardiomyopathy -H/O CABG on 7/16 P:  Norepi off since am 8/16, d/c from EMR Diureses as below.  RENAL A:  AKI, improved Hypomag Hypokalemia  Elevated bicarb. P:   Replace electrolytes as ordered.  Follow BMP daily. D/C  scheduled potassium Hold further lasix for now. Diamox 250 q8 x2 doses.  GASTROINTESTINAL A:  s/p total colectomy for ischemic bowel S/p  ileostomy  P:   OFF TPN, to TF 8/20. Unable to get panda, IR referral for placement hopefully today. Protonix for SUP. Swallow evaluation as ordered hopefully to be done today.  HEMATOLOGIC A: Anemia Leukocytosis -  improved P:  Transfusion only if HB <7.  INFECTIOUS A:  Septic shock from ischemic bowel. R LE ulcer, chronic Fever spike 8/21 >check bc /sputum  P:   Abx: Ceftaz 8/4>> 8/18 Vanco 8/5>> 8/6 Flagyl 8/4>> 8/11 Diflucan 8/4>>8/11  Abx completed  x 14 days, rpt abd CT showed no abscess.  Right foot ulcer - wound care follow up  [facility] 8/21 >>NTD Sputum cx 8/21 >>NTD  ENDOCRINE A:  Hyperglycemia without h/o DM P:  SSI   NEUROLOGIC A: Pain, Anxiety  deconditioning P:   D/c versed  Fentanyl prn PT evaluation and treatment. OOB as able.  Discussed with bedside RN.  Alyson Reedy, M.D. [facility] Pulmonary/Critical Care Medicine. Pager: [phone]. After hours pager: [phone].

## 2015-07-07 NOTE — Progress Notes (Signed)
Pt not placed on vent at this time.  Pt sats are 97% on 40% atc, and no distress noted.  Rt wi;; continue to monitor.

## 2015-07-07 NOTE — Progress Notes (Signed)
Speech Pathology   MBSS complete. Full report located under chart review in imaging section. Click on DG swallow function. Rec: Dys 1, honey liquids, FULL ASSIST DUE TO CONFUSION, crush meds        Royce Macadamia M.Ed [employer] [phone]

## 2015-07-07 NOTE — Progress Notes (Signed)
Speech Language Pathology Treatment: Jonathan Le Speaking valve  Patient Details Name: Jonathan Le MRN: [id number] DOB: 02/15/1946 Today's Date: 07/07/2015 Time: 0840-0900 SLP Time Calculation (min) (ACUTE ONLY): 20 min  Assessment / Plan / Recommendation Clinical Impression  Pt seen for PMSV treat, confused this am requiring frequent redirection. Vitals all within normal range with PMSV. Vocal quality raspy with adequate volume and mildly decreased respiratory support. Redirection of air to upper airway adequate; valve remaining on trach hub entire session. Plan was to repeat FEES, however with confusion this am stating "let's do this tomorrow', pt will likely have greater cooperation and success with MBS (in xray room accommodating for pt's with broader shoulders). MBS will be 12:00.   HPI Other Pertinent Information: 69 year old male with PMH: OSA, HTN, anxiety CABG 05/2015 admitted after becoming diaphoretic and LOC. Pt found to have septic shock secondary to ischemic colon. Intubated 8/4-8/8 (self extubated), reintubated 8/16 due to possible aspiration and received trach 07/03/15 ( #8, cuff).     Pertinent Vitals Pain Assessment: No/denies pain  SLP Plan  MBS;Continue with current plan of care    Recommendations        Patient may use Passy-Muir Speech Valve:  (SLP/RN) PMSV Supervision: Full MD: Please consider changing trach tube to : Smaller size       Oral Care Recommendations: Oral care BID Follow up Recommendations: 24 hour supervision/assistance;Skilled Nursing facility Plan: MBS;Continue with current plan of care    GO     Royce Macadamia 07/07/2015, 9:18 AM  Breck Coons Lonell Face.Ed [employer] [phone]

## 2015-07-07 NOTE — Consult Note (Signed)
Physical Medicine and Rehabilitation Consult   Reason for Consult: Encephalopathy after colectomy and recent CABG.  Referring Physician: Dr. Marchelle Gearing.   HPI: Jonathan Le is a 69 y.o. male with history of HTN, anxiety disorder, CAD s/p CABG 06/11/15, constipation who was admitted on 06/18/15 with diaphoresis and syncope while straining to have a BM.  He was found have SOB, profound hypotensive due to septic shock from ischemic colitis. He was started on pressors as well as IV antibiotics and  underwent total abdominal colectomy with VAC placement  by Dr.  Dwain Sarna on the same day. He was taken back to OR on 08/06 for abdominal closure with ileostomy by Dr. Jearld Fenton.  He self extubated on 08/8 but developed respiratory distress with confusion non responsive to BIPAP/CPAP. He was re-intubated on 08/16 and required tracheostomy on 08/19 by Dr. Pollyann Kennedy. He was extubated to ATC but continues to require vent support at nights. Swallow evaluation done today and patient to start purees with honey liquids. He is tolerating PMSV trials and is noted to have dysphonia as well as confusion.  PT ongoing and working on pregait activity. CIR recommended for follow up therapy.      Review of Systems  Unable to perform ROS: other  Respiratory: Positive for cough and shortness of breath.   Cardiovascular: Negative for chest pain.  Gastrointestinal: Positive for abdominal pain.      Past Medical History  Diagnosis Date  . Anxiety   . Hypertension   . Arthritis   . Sleep apnea     wears CPAP nightly  . Glaucoma   . CHF (congestive heart failure)     MI, with open heart surgery x 30 sdays ago per pt. (06/26/15)    Past Surgical History  Procedure Laterality Date  . Hernia repair      right  . Eye surgery      bil cataract  . Wisdom tooth extraction    . Fasciotomy foot / toe Left     foot  . Cardiac catheterization N/A 05/20/2015    Procedure: Left Heart Cath and Coronary Angiography;   Surgeon: Kathleene Hazel, MD;  Location: [facility] INVASIVE CV LAB;  Service: Cardiovascular;  Laterality: N/A;  . Coronary artery bypass graft N/A 05/22/2015    Procedure: CORONARY ARTERY BYPASS GRAFTING (CABG), ON PUMP, TIMES THREE, USING LEFT INTERNAL MAMMARY ARTERY, RIGHT GREATER SAPHENOUS VEIN HARVESTED ENDOSCOPICALLY;  Surgeon: Kerin Perna, MD;  Location: [facility] OR;  Service: Open Heart Surgery;  Laterality: N/A;  LIMA-LAD, SVG-OM, SVG-PD  . Tee without cardioversion N/A 05/22/2015    Procedure: TRANSESOPHAGEAL ECHOCARDIOGRAM (TEE);  Surgeon: Kerin Perna, MD;  Location: [facility] OR;  Service: Open Heart Surgery;  Laterality: N/A;  . Laparotomy N/A 06/18/2015    Procedure: EXPLORATORY LAPAROTOMY;  Surgeon: Emelia Loron, MD;  Location: [facility] OR;  Service: General;  Laterality: N/A;  . Laparotomy N/A 06/20/2015    Procedure: WASHOUT OF ABDOMEN, CLOSURE OF ABDOMINAL WALL WITH PLACEMENT OF WOUND VAC AND ILLEOSTOMY ;  Surgeon: Almond Lint, MD;  Location: [facility] OR;  Service: General;  Laterality: N/A;  . Tracheostomy tube placement N/A 07/03/2015    Procedure: TRACHEOSTOMY;  Surgeon: Serena Colonel, MD;  Location: [facility] OR;  Service: ENT;  Laterality: N/A;    History reviewed. No pertinent family history.    Social History:  Married. Independent with AD prior to CABG.  Per reports that he has never smoked. He has never used smokeless tobacco. Per  reports that he does not drink alcohol or use illicit drugs.    Allergies  Allergen Reactions  . Levaquin [Levofloxacin] Nausea Only  . Penicillins Hives  . Doxycycline Rash    Medications Prior to Admission  Medication Sig Dispense Refill  . acetaminophen (TYLENOL) 500 MG tablet Take 1,500 mg by mouth every 6 (six) hours as needed for headache.    . ALPRAZolam (XANAX) 1 MG tablet Take 1 tablet (1 mg total) by mouth 2 (two) times daily. 10 tablet 0  . amiodarone (PACERONE) 200 MG tablet Take 1 tablet (200 mg total) by mouth daily. 30 tablet 1  . aspirin EC 325  MG EC tablet Take 1 tablet (325 mg total) by mouth daily. 30 tablet 0  . atorvastatin (LIPITOR) 80 MG tablet Take 1 tablet (80 mg total) by mouth daily at 6 PM. 30 tablet 3  . clindamycin (CLEOCIN) 300 MG capsule Take 1 capsule (300 mg total) by mouth 3 (three) times daily. 30 capsule 0  . finasteride (PROSCAR) 5 MG tablet Take 5 mg by mouth daily.    . furosemide (LASIX) 40 MG tablet TAKE 1 TABLET BY MOUTH DAILY 90 tablet 0  . lactulose, encephalopathy, (CHRONULAC) 10 GM/15ML SOLN Take 20 g by mouth daily as needed (for constipation).    Marland Kitchen lisinopril (PRINIVIL,ZESTRIL) 5 MG tablet Take 1 tablet (5 mg total) by mouth daily. 30 tablet 3  . metolazone (ZAROXOLYN) 5 MG tablet TAKE 1 TABLET BY MOUTH EVERY DAY 90 tablet 0  . potassium chloride SA (K-DUR,KLOR-CON) 20 MEQ tablet TAKE 1 TABLET BY MOUTH DAILY 90 tablet 0  . sodium phosphate (FLEET) enema Place 1 enema rectally once. follow package directions      Home: Home Living Family/patient expects to be discharged to:: Private residence Living Arrangements: Spouse/significant other Available Help at Discharge: Family, Available 24 hours/day Type of Home: House Home Access: Stairs to enter [employer] of Steps: 2 Home Layout: Two level, Bed/bath upstairs, Able to live on main level with bedroom/bathroom Home Equipment: Walker - 4 wheels  Functional History: Prior Function Level of Independence: Independent with assistive device(s) Comments: using rollator; mostly limited by back and knee pain; denied need for assist with ADLs; reports was no longer receiveing HH therapies Functional Status:  Mobility: Bed Mobility Overal bed mobility: Needs Assistance, +2 for physical assistance Bed Mobility: Supine to Sit, Sit to Supine Supine to sit: Mod assist, HOB elevated, +2 for safety/equipment Sit to supine: Max assist, +2 for physical assistance General bed mobility comments: Patient able to assist in pulling himself to sitting,  required management of lines and tubes. Patient required max A x1 for bil LE for sit to supine transfer and max A x2 for repositioning in the bed at end of session. Transfers Overall transfer level: Needs assistance Equipment used: Rolling walker (2 wheeled) Transfer via Lift Equipment: Hydrographic surveyor Transfers: Sit to/from Stand Sit to Stand: Total assist, From elevated surface  Lateral/Scoot Transfers: +2 physical assistance (unable) General transfer comment: Patient able to perform 4 sit to/from stand with tactile cues for hip extension while using the [facility] lift. Ambulation/Gait Ambulation/Gait assistance: Min assist, +2 physical assistance, +2 safety/equipment Ambulation Distance (Feet): 3 Feet Assistive device: Rolling walker (2 wheeled) Gait Pattern/deviations: Step-to pattern, Shuffle, Trunk flexed General Gait Details: unable Gait velocity: Decreased Gait velocity interpretation: <1.8 ft/sec, indicative of risk for recurrent falls    ADL:    Cognition: Cognition Overall Cognitive Status: Within Functional Limits for tasks assessed  Orientation Level: Oriented to person, Oriented to place, Disoriented to time Cognition Arousal/Alertness: Awake/alert Behavior During Therapy: [facility] for tasks assessed/performed Overall Cognitive Status: Within Functional Limits for tasks assessed Area of Impairment: Following commands, Problem solving Following Commands: Follows one step commands consistently Problem Solving: Slow processing, Decreased initiation, Difficulty sequencing, Requires verbal cues, Requires tactile cues General Comments: Patient able to communicate effectively with P-M valve Difficult to assess due to: Tracheostomy  Blood pressure 141/74, pulse 71, temperature 98.6 F (37 C), temperature source Oral, resp. rate 20, height  (1.753 m), weight 118.8 kg (261 lb 14.5 oz), SpO2 97 %. Physical Exam  Nursing note and vitals reviewed. Constitutional: He appears  well-developed and well-nourished.  Sitter in the room for safety. Fatigued appearing. Obese. Leg partially off bed.    HENT:  Head: Normocephalic and atraumatic.  Eyes: Conjunctivae are normal. Pupils are equal, round, and reactive to light.  Neck:  Trach in place  Cardiovascular: Regular rhythm.  Tachycardia present.   Respiratory: No respiratory distress. He has rhonchi.  GI: Soft. He exhibits no distension. Bowel sounds are increased. There is tenderness.  Musculoskeletal: He exhibits no edema or tenderness.  Neurological: He is alert.  Oriented to self and place. Able to weakly phonate with trach occluded but congested and wet voice noted.  Able to follow simple motor commands. UE's 3/5 delt,bic,tri,wrist, HI. LE: 1+HF, 2ke and 2+ adf/apf. Senses pain in all 4  Skin: Skin is warm and dry.  Psychiatric:  Pleasant and appropriate.    Results for orders placed or performed during the hospital encounter of 06/18/15 (from the past 24 hour(s))  Glucose, capillary     Status: None   Collection Time: 07/06/15 12:52 PM  Result Value Ref Range   Glucose-Capillary 96 65 - 99 mg/dL  Glucose, capillary     Status: None   Collection Time: 07/06/15  5:24 PM  Result Value Ref Range   Glucose-Capillary 95 65 - 99 mg/dL  Glucose, capillary     Status: None   Collection Time: 07/06/15  8:35 PM  Result Value Ref Range   Glucose-Capillary 96 65 - 99 mg/dL   Comment 1 Capillary Specimen   Glucose, capillary     Status: None   Collection Time: 07/06/15 11:59 PM  Result Value Ref Range   Glucose-Capillary 96 65 - 99 mg/dL   Comment 1 Capillary Specimen   Glucose, capillary     Status: Abnormal   Collection Time: 07/07/15  3:13 AM  Result Value Ref Range   Glucose-Capillary 100 (H) 65 - 99 mg/dL   Comment 1 Capillary Specimen   Procalcitonin     Status: None   Collection Time: 07/07/15  5:50 AM  Result Value Ref Range   Procalcitonin 0.24 ng/mL  CBC     Status: Abnormal   Collection Time:  07/07/15  5:50 AM  Result Value Ref Range   WBC 8.0 4.0 - 10.5 K/uL   RBC 3.99 (L) 4.22 - 5.81 MIL/uL   Hemoglobin 11.2 (L) 13.0 - 17.0 g/dL   HCT 16.1 (L) 09.6 - 04.5 %   MCV 91.7 78.0 - 100.0 fL   MCH 28.1 26.0 - 34.0 pg   MCHC 30.6 30.0 - 36.0 g/dL   RDW 40.9 (H) 81.1 - 91.4 %   Platelets 206 150 - 400 K/uL  Basic metabolic panel     Status: Abnormal   Collection Time: 07/07/15  5:50 AM  Result Value Ref Range   Sodium  150 (H) 135 - 145 mmol/L   Potassium 3.4 (L) 3.5 - 5.1 mmol/L   Chloride 111 101 - 111 mmol/L   CO2 31 22 - 32 mmol/L   Glucose, Bld 101 (H) 65 - 99 mg/dL   BUN 21 (H) 6 - 20 mg/dL   Creatinine, Ser 1.61 0.61 - 1.24 mg/dL   Calcium 8.3 (L) 8.9 - 10.3 mg/dL   GFR calc non Af Amer >60 >60 mL/min   GFR calc Af Amer >60 >60 mL/min   Anion gap 8 5 - 15  Magnesium     Status: None   Collection Time: 07/07/15  5:50 AM  Result Value Ref Range   Magnesium 1.8 1.7 - 2.4 mg/dL  Phosphorus     Status: None   Collection Time: 07/07/15  5:50 AM  Result Value Ref Range   Phosphorus 3.3 2.5 - 4.6 mg/dL  Glucose, capillary     Status: None   Collection Time: 07/07/15  8:37 AM  Result Value Ref Range   Glucose-Capillary 95 65 - 99 mg/dL   Comment 1 Capillary Specimen   APTT     Status: None   Collection Time: 07/07/15 10:03 AM  Result Value Ref Range   aPTT 26 24 - 37 seconds  Protime-INR     Status: Abnormal   Collection Time: 07/07/15 10:03 AM  Result Value Ref Range   Prothrombin Time 18.0 (H) 11.6 - 15.2 seconds   INR 1.48 0.00 - 1.49  Glucose, capillary     Status: None   Collection Time: 07/07/15 11:57 AM  Result Value Ref Range   Glucose-Capillary 92 65 - 99 mg/dL   Comment 1 Capillary Specimen    Dg Chest Port 1 View  07/07/2015   CLINICAL DATA:  Shortness of breath.  EXAM: PORTABLE CHEST - 1 VIEW  COMPARISON:  07/06/2015.  FINDINGS: Tracheostomy tube and right PICC line stable position. Prior CABG. Cardiomegaly with mild pulmonary vascular prominence.  Bibasilar atelectasis and/or infiltrates are noted. No pleural effusion or pneumothorax.  IMPRESSION: 1. Tracheostomy tube and right PICC line in stable position. 2. Prior CABG. Cardiomegaly with mild pulmonary vascular prominence. 3. Low lung volumes with bibasilar subsegmental atelectasis and/or infiltrates. A component of mild bibasilar pneumonia and/or pulmonary edema cannot be excluded .   Electronically Signed   By: Maisie Fus  Register   On: 07/07/2015 07:11   Dg Chest Port 1 View  07/06/2015   CLINICAL DATA:  Respiratory failure.  EXAM: PORTABLE CHEST - 1 VIEW  COMPARISON:  07/05/2015.  FINDINGS: Interval removal of feeding tube. Tracheostomy tube, right PICC line in stable position. Bibasilar subsegmental atelectasis. No pleural effusion or pneumothorax. Prior CABG. Cardiomegaly. No acute bony abnormality.  IMPRESSION: 1. Interval removal of feeding tube. Tracheostomy tube and right PICC line stable position.  2. Prior CABG. Cardiomegaly. No evidence of overt congestive heart failure on today's exam.  3.  Low lung volumes with bibasilar subsegmental atelectasis.   Electronically Signed   By: Maisie Fus  Register   On: 07/06/2015 07:47    Assessment/Plan: Diagnosis: debility and encephalopathy after colectomy and recent CABG 1. Does the need for close, 24 hr/day medical supervision in concert with the patient's rehab needs make it unreasonable for this patient to be served in a less intensive setting? Yes 2. Co-Morbidities requiring supervision/potential complications: MI, morbid obesity, OSA, chronic low back pain, htn 3. Due to bladder management, bowel management, safety, skin/wound care, disease management, medication administration, pain management and patient education, does  the patient require 24 hr/day rehab nursing? Yes 4. Does the patient require coordinated care of a physician, rehab nurse, PT (1-2 hrs/day, 5 days/week), OT (1-2 hrs/day, 5 days/week) and SLP (1-2 hrs/day, 5 days/week) to  address physical and functional deficits in the context of the above medical diagnosis(es)? Yes Addressing deficits in the following areas: balance, endurance, locomotion, strength, transferring, bowel/bladder control, bathing, dressing, feeding, grooming, toileting, speech, language, swallowing and psychosocial support 5. Can the patient actively participate in an intensive therapy program of at least 3 hrs of therapy per day at least 5 days per week? Yes 6. The potential for patient to make measurable gains while on inpatient rehab is good 7. Anticipated functional outcomes upon discharge from inpatient rehab are supervision  with PT, supervision and min assist with OT, modified independent and supervision with SLP. 8. Estimated rehab length of stay to reach the above functional goals is: 17-22 days 9. Does the patient have adequate social supports and living environment to accommodate these discharge functional goals? Yes and Potentially 10. Anticipated D/C setting: Home 11. Anticipated post D/C treatments: HH therapy and Outpatient therapy 12. Overall Rehab/Functional Prognosis: excellent  RECOMMENDATIONS: This patient's condition is appropriate for continued rehabilitative care in the following setting: CIR Patient has agreed to participate in recommended program. Yes Note that insurance prior authorization may be required for reimbursement for recommended care.  Comment: Rehab Admissions Coordinator to follow up.  Thanks,  Ranelle Oyster, MD, [place] [facility]     07/07/2015

## 2015-07-08 ENCOUNTER — Ambulatory Visit: Payer: Medicare Other | Admitting: Cardiothoracic Surgery

## 2015-07-08 ENCOUNTER — Inpatient Hospital Stay
Admission: AD | Admit: 2015-07-08 | Discharge: 2015-07-21 | Payer: Medicare Other | Source: Ambulatory Visit | Attending: Internal Medicine | Admitting: Internal Medicine

## 2015-07-08 DIAGNOSIS — N19 Unspecified kidney failure: Secondary | ICD-10-CM

## 2015-07-08 DIAGNOSIS — IMO0001 Reserved for inherently not codable concepts without codable children: Secondary | ICD-10-CM | POA: Insufficient documentation

## 2015-07-08 DIAGNOSIS — J189 Pneumonia, unspecified organism: Secondary | ICD-10-CM

## 2015-07-08 DIAGNOSIS — J9601 Acute respiratory failure with hypoxia: Secondary | ICD-10-CM

## 2015-07-08 DIAGNOSIS — J9690 Respiratory failure, unspecified, unspecified whether with hypoxia or hypercapnia: Secondary | ICD-10-CM

## 2015-07-08 DIAGNOSIS — T148XXA Other injury of unspecified body region, initial encounter: Secondary | ICD-10-CM

## 2015-07-08 DIAGNOSIS — Z4659 Encounter for fitting and adjustment of other gastrointestinal appliance and device: Secondary | ICD-10-CM

## 2015-07-08 DIAGNOSIS — Z01818 Encounter for other preprocedural examination: Secondary | ICD-10-CM

## 2015-07-08 DIAGNOSIS — Z930 Tracheostomy status: Secondary | ICD-10-CM | POA: Insufficient documentation

## 2015-07-08 LAB — BASIC METABOLIC PANEL
ANION GAP: 7 (ref 5–15)
Anion gap: 5 (ref 5–15)
BUN: 20 mg/dL (ref 6–20)
BUN: 16 mg/dL (ref 6–20)
CHLORIDE: 121 mmol/L — AB (ref 101–111)
CO2: 32 mmol/L (ref 22–32)
CO2: 27 mmol/L (ref 22–32)
Calcium: 8.2 mg/dL — ABNORMAL LOW (ref 8.9–10.3)
Calcium: 6.9 mg/dL — ABNORMAL LOW (ref 8.9–10.3)
Chloride: 110 mmol/L (ref 101–111)
Creatinine, Ser: 0.55 mg/dL — ABNORMAL LOW (ref 0.61–1.24)
Creatinine, Ser: 0.44 mg/dL — ABNORMAL LOW (ref 0.61–1.24)
GFR calc Af Amer: >60 mL/min (ref >60)
GFR calc non Af Amer: >60 mL/min (ref >60)
Glucose, Bld: 109 mg/dL — ABNORMAL HIGH (ref 65–99)
Glucose, Bld: 109 mg/dL — ABNORMAL HIGH (ref 65–99)
POTASSIUM: 2.8 mmol/L — AB (ref 3.5–5.1)
POTASSIUM: 3 mmol/L — AB (ref 3.5–5.1)
SODIUM: 149 mmol/L — AB (ref 135–145)
SODIUM: 153 mmol/L — AB (ref 135–145)

## 2015-07-08 LAB — GLUCOSE, CAPILLARY
GLUCOSE-CAPILLARY: 103 mg/dL — AB (ref 65–99)
GLUCOSE-CAPILLARY: 126 mg/dL — AB (ref 65–99)
Glucose-Capillary: 108 mg/dL — ABNORMAL HIGH (ref 65–99)
Glucose-Capillary: 146 mg/dL — ABNORMAL HIGH (ref 65–99)

## 2015-07-08 LAB — CBC
HCT: 36.7 % — ABNORMAL LOW (ref 39.0–52.0)
HEMOGLOBIN: 11 g/dL — AB (ref 13.0–17.0)
MCH: 27.6 pg (ref 26.0–34.0)
MCHC: 30 g/dL (ref 30.0–36.0)
MCV: 92 fL (ref 78.0–100.0)
PLATELETS: 257 10*3/uL (ref 150–400)
RBC: 3.99 MIL/uL — AB (ref 4.22–5.81)
RDW: 16.2 % — ABNORMAL HIGH (ref 11.5–15.5)
WBC: 6.8 10*3/uL (ref 4.0–10.5)

## 2015-07-08 LAB — CULTURE, RESPIRATORY

## 2015-07-08 LAB — CULTURE, RESPIRATORY W GRAM STAIN

## 2015-07-08 LAB — MAGNESIUM: MAGNESIUM: 2.3 mg/dL (ref 1.7–2.4)

## 2015-07-08 LAB — PROTIME-INR
INR: 1.49 (ref 0.00–1.49)
PROTHROMBIN TIME: 18.1 s — AB (ref 11.6–15.2)

## 2015-07-08 LAB — PHOSPHORUS: PHOSPHORUS: 3.1 mg/dL (ref 2.5–4.6)

## 2015-07-08 LAB — APTT: APTT: 29 s (ref 24–37)

## 2015-07-08 MED ORDER — SODIUM CHLORIDE 0.9 % IV SOLN: 10.0000 mL | INTRAVENOUS | Status: DC

## 2015-07-08 MED ORDER — ENOXAPARIN SODIUM 30 MG/0.3ML ~~LOC~~ SOLN: 30.0000 mg | SUBCUTANEOUS | Status: DC

## 2015-07-08 MED ORDER — INSULIN ASPART 100 UNIT/ML ~~LOC~~ SOLN: 2.0000 [IU] | Freq: Three times a day (TID) | SUBCUTANEOUS | Status: DC

## 2015-07-08 MED ORDER — WHITE PETROLATUM GEL: 1.0000 "application " | Freq: Every day | Status: DC

## 2015-07-08 MED ORDER — RESOURCE THICKENUP CLEAR PO POWD: ORAL | Status: DC

## 2015-07-08 MED ORDER — COLLAGENASE 250 UNIT/GM EX OINT: TOPICAL_OINTMENT | Freq: Every day | CUTANEOUS | Status: DC

## 2015-07-08 MED ORDER — PANTOPRAZOLE SODIUM 40 MG PO PACK: 40.0000 mg | PACK | Freq: Every day | ORAL | Status: DC

## 2015-07-08 MED ORDER — CHLORHEXIDINE GLUCONATE 0.12% ORAL RINSE (MEDLINE KIT): 15.0000 mL | Freq: Two times a day (BID) | OROMUCOSAL | Status: DC

## 2015-07-08 MED ORDER — ADULT MULTIVITAMIN W/MINERALS CH: 1.0000 | ORAL_TABLET | Freq: Every day | ORAL | Status: DC

## 2015-07-08 MED ORDER — PANTOPRAZOLE SODIUM 40 MG PO PACK
40.0000 mg | PACK | Freq: Every day | ORAL | Status: DC
  Filled 2015-07-08: qty 20

## 2015-07-08 MED ORDER — PRO-STAT SUGAR FREE PO LIQD: 30.0000 mL | Freq: Three times a day (TID) | ORAL | Status: DC

## 2015-07-08 MED ORDER — POTASSIUM CHLORIDE 10 MEQ/50ML IV SOLN
10.0000 meq | INTRAVENOUS | Status: AC
  Administered 2015-07-08 (×6): 10 meq via INTRAVENOUS
  Filled 2015-07-08 (×6): qty 50

## 2015-07-08 NOTE — Progress Notes (Signed)
Discharged to [facility]  by bed, stable, report given to RN, belongings taken by wife.

## 2015-07-08 NOTE — Discharge Summary (Signed)
Physician Discharge Summary  Patient ID: Jonathan Le MRN: [id number] DOB/AGE: 1946/04/28 69 y.o.  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 Anemia Leukocytosis Right lower extremity chronic ulcer Hyperglycemia Anxiety Pain Deconditioning of critical illness                                                                        DISCHARGE PLAN BY DIAGNOSIS      Acute respiratory failure secondary to septic shock with ischemic bowel OSA Vent dependent respiratory failure Tracheostomy status Septic shock Ischemic cardiomyopathy History of CABG Acute kidney injury Elevated bicarbonate (due to diuresis) Hypomagnesemia Hypokalemia 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 Anemia Leukocytosis Right lower extremity chronic ulcer Hyperglycemia Anxiety Pain Deconditioning of critical illness   Discharge Plan: Tracheostomy care daily and PRN with inner cannula change Oxygen as needed to support saturations > 92% PT / OT, mobilize as able  Intermittent CXR for new symptoms  Dysphagia 1 Diet with honey thick liquids Heart Healthy diet   Wound care:  Change abdominal wound dressing BID with wet to dry dressing Routine ostomy care Adjust CBG's to ACHS Hold home amiodarone, lasix + KCL for now Continue right foot wound care with santyl  Monitor fever curve / leukocytosis                  DISCHARGE SUMMARY   Jonathan Le is a 69 y.o. y/o male with a PMH of anxiety, arthritis, glaucoma, obstructive sleep apnea on CPAP, hypertension, CHF and CAD status post CABG 05/22/2015 with a brief ICU stay postoperatively. That hospitalization  was, gated by atrial fibrillation with RVR and he was treated with amiodarone. After discharge he was noted to have a right leg cellulitis after endoscopic vein harvest and was seen by Dr. Prescott Gum in the clinic. He also was complaining of constipation/straining to use the bathroom and had been attempting enemas at home. On the evening of 06/18/15 he used an enema and was straining in the bathroom when he became diaphoretic and had loss of consciousness. Initial workup noted the patient to be profoundly hypotensive with shortness of breath.  Further workup distant with ischemic colitis and associated septic shock. He was admitted to the ICU for further management.  He was supported with volume resuscitation and vasopressors.  Patient was taken emergently to the OR per Dr. Donne Hazel for abdominal colectomy with [facility] placement (8/4).  He was taken back to the OR on 8/6 for abdominal closure with ileostomy per Dr. Barry Dienes.  The patient was returned to the ICU on mechanical ventilation. He subsequently self extubated on 8/8 and developed respiratory distress with confusion that was treated with BiPAP. He ultimately was reintubated on 8/16 and tracheostomy was placed by Dr. Constance Holster on 8/19. The patient was liberated to aerosolized trach collar.  He is progressed with SLP and was cleared for a dysphagia 1 diet with honey thick liquids. He also is tolerating PMV without difficulty. Patient is participating with ongoing physical  therapy. Jonathan Le was medically cleared 8/24 for discharge with plans as above.               SIGNIFICANT DIAGNOSTIC STUDIES ECHO 8/4 >> estimated ejection fraction was in the range of 25% to 30%.  Severe hypokinesis of the anteroseptal and anterior myocardium.Dyskinesis of the apical myocardium. .  CT chest abd pelvis 8/4 >>  CTA CHEST: Limited CTA of the chest, poor bolus timing.Moderate to severe cardiomegaly with small pericardial effusion.Evidence of pulmonary edema with small pleural  effusions and, patchy airspace opacities most consistent with confluent edema.  CT ABDOMEN AND PELVIS: Focal colonic hepatic flexure pneumatosis without bowel perforation. Moderate to large amount of distal retained large bowel stool. Mild probable ileus, no bowel obstruction.  Bilateral nonobstructing nephrolithiasis measuring up to 4 mm.  CT abdomen 8/12 >> no evidence intra-abd abscess, ascites present, GB sludge, small B pleural effusions.  SIGNIFICANT EVENTS: 8/04  Total abdominal colectomy with abdominal vac placement 8/06  Exploratory laparotomy, removal of prior packs, closure of open abdomen, ileostomy 8/07  high pressors 8/08  self extubated 8/09 resp distress , required bipap , lasix 8/13  had to restart levophed for shock overnight. 8/16  reintubated, primarily for MS, agitation, tachypnea 8/19  Trach placed 8/22  tolerating TC.  MICRO DATA  C-Diff 8/4 >> negative  BCx2 8/4 >> negative  Sputum 8/16 >> negative  BCx1 8/18 >> negative  BCx2 8/21 >>  Sputum 8/21 >> moderate group B strep (s. Agalactiae).  ANTIBIOTICS Ceftaz 8/4>> 8/18 Vanco 8/5>> 8/6 Flagyl 8/4>> 8/11 Diflucan 8/4>>8/11  CONSULTS Physical Therapy  Inpatient Rehab   TUBES / LINES ETT 8/4 >>8/8 ETT 8/16 >> 8/19 Trach 8/19 (Dr Constance Holster) >>  RUE PICC 8/18 >>   Discharge Exam: General: chronically ill appearing adult male in NAD Neuro: arouses to voice, follows commands, remains confused, MAE CV: s1s2 rrr, no m/r/g PULM: resp's even/non-labored, lungs bilaterally diminished, #8 trach midline c/d/i GI: obese/soft, bsx4 active, incision clean / dry, ileostomy with good output  Extremities: no acute deformities, 1-2+ edema  Filed Vitals:   07/08/15 1000 07/08/15 1100 07/08/15 1157 07/08/15 1227  BP: 149/70   108/52  Pulse: 101   92  Temp:    97.6 F (36.4 C)  TempSrc:    Oral  Resp: 34   38  Height:      Weight:  257 lb 0.9 oz (116.6 kg)    SpO2: 95%  96% 96%     Discharge  Labs  BMET  Recent Labs Lab 07/04/15 0400 07/05/15 0500 07/06/15 0600 07/07/15 0550 07/08/15 0515  NA 144 146* 147* 150* 149*  K 3.5 3.3* 3.3* 3.4* 2.8*  CL 105 107 106 111 110  CO2 '31 31 30 31 ' 32  GLUCOSE 109* 118* 109* 101* 109*  BUN '18 20 20 ' 21* 20  CREATININE 0.54* 0.51* 0.60* 0.66 0.55*  CALCIUM 8.2* 8.2* 8.1* 8.3* 8.2*  MG  --  1.7  --  1.8 2.3  PHOS  --  3.1  --  3.3 3.1   CBC  Recent Labs Lab 07/06/15 0600 07/07/15 0550 07/08/15 0515  HGB 10.9* 11.2* 11.0*  HCT 35.1* 36.6* 36.7*  WBC 9.4 8.0 6.8  PLT 346 206 257   Anti-Coagulation  Recent Labs Lab 07/04/15 0400 07/05/15 0500 07/06/15 0600 07/07/15 1003 07/08/15 0515  INR 1.41 1.43 1.51* 1.48 1.49        Follow-up Information    Follow up with Rolm Bookbinder, MD. Schedule  an appointment as soon as possible for a visit in 3 weeks.   Specialty:  General Surgery   Contact information:   [address] [place] [address] [phone]       Follow up with Vicenta Aly, FNP.   Specialty:  Nurse Practitioner   Why:  Follow up post discharge from [place] information:   [place] [address] [phone]       Follow up with [facility].   Contact information:   [place] [phone]         Medication List    STOP taking these medications        acetaminophen 500 MG tablet  Commonly known as:  TYLENOL     ALPRAZolam 1 MG tablet  Commonly known as:  XANAX     amiodarone 200 MG tablet  Commonly known as:  PACERONE     clindamycin 300 MG capsule  Commonly known as:  CLEOCIN     furosemide 40 MG tablet  Commonly known as:  LASIX     lactulose (encephalopathy) 10 GM/15ML Soln  Commonly known as:  CHRONULAC     lisinopril 5 MG tablet  Commonly known as:  PRINIVIL,ZESTRIL     metolazone 5 MG tablet  Commonly known as:  ZAROXOLYN      potassium chloride SA 20 MEQ tablet  Commonly known as:  K-DUR,KLOR-CON     sodium phosphate enema  Commonly known as:  FLEET      TAKE these medications        aspirin 325 MG EC tablet  Take 1 tablet (325 mg total) by mouth daily.     atorvastatin 80 MG tablet  Commonly known as:  LIPITOR  Take 1 tablet (80 mg total) by mouth daily at 6 PM.     chlorhexidine gluconate 0.12 % solution  Commonly known as:  PERIDEX  15 mLs by Mouth Rinse route 2 (two) times daily.     collagenase ointment  Commonly known as:  SANTYL  Apply topically daily. Apply to R foot wound daily     enoxaparin 30 MG/0.3ML injection  Commonly known as:  LOVENOX  Inject 0.3 mLs (30 mg total) into the skin daily.     feeding supplement (PRO-STAT SUGAR FREE 64) Liqd  Place 30 mLs into feeding tube 3 (three) times daily.     finasteride 5 MG tablet  Commonly known as:  PROSCAR  Take 5 mg by mouth daily.     insulin aspart 100 UNIT/ML injection  Commonly known as:  novoLOG  Inject 2-6 Units into the skin 3 (three) times daily with meals.     multivitamin with minerals Tabs tablet  Take 1 tablet by mouth daily.     pantoprazole sodium 40 mg/20 mL Pack  Commonly known as:  PROTONIX  Place 20 mLs (40 mg total) into feeding tube at bedtime.     RESOURCE THICKENUP CLEAR Powd  As needed for honey thick liquid consistency     sodium chloride 0.9 % infusion  Inject 10 mLs into the vein continuous.     white petrolatum Gel  Commonly known as:  VASELINE  Apply 1 application topically daily. Apply to L nare wound daily          Disposition:  [facility] in [place]   Discharged Condition: Jonathan Le has met  maximum benefit of inpatient care and is medically stable and cleared for discharge.  Patient is pending follow up as above.      Time spent on disposition:  Greater than 35 minutes.   Signed: Noe Gens, NP-C [facility] Pgr: [phone] Office:  [phone]

## 2015-07-08 NOTE — Progress Notes (Signed)
eLink Physician-Brief Progress Note Patient Name: Jonathan Le DOB: 1946/07/26 MRN: [id number]   Date of Service  07/08/2015  HPI/Events of Note  K+ = 2.8 and Creatinine = 0.55.  eICU Interventions  Will replete K+.     Intervention Category Intermediate Interventions: Electrolyte abnormality - evaluation and management  Happy Ky Eugene 07/08/2015, 6:59 AM

## 2015-07-08 NOTE — Care Management Note (Signed)
Case Management Note  Patient Details  Name: Jonathan Le MRN: [id number] Date of Birth: 27-Oct-1946  Subjective/Objective:    Adm w shock, has trach                Action/Plan: lives w wife Jonathan Le   Expected Discharge Date:  07/08/15                Expected Discharge Plan:  Long Term Acute Care (LTAC)  In-House Referral:     Discharge planning Services  CM Consult  Post Acute Care Choice:  Long Term Acute Care (LTAC) Choice offered to:     DME Arranged:    DME Agency:     HH Arranged:    HH Agency:     Status of Service:  Completed, signed off  Medicare Important Message Given:  Yes-fourth notification given Date Medicare IM Given:    Medicare IM give by:    Date Additional Medicare IM Given:    Additional Medicare Important Message give by:     If discussed at Long Length of Stay Meetings, dates discussed:    Additional Comments: [facility]. Cir felt not quit ready for 3+hrs aday of rehab and wife agrees that ltac better option at present. [facility] [place] [facility].  Hanley Hays, RN 07/08/2015, 12:05 PM

## 2015-07-08 NOTE — Progress Notes (Signed)
Inpatient Rehabilitation  Pt. to be transferred to [facility] today.  Pt. not ready for intensity of IP Rehab program.  I visited his wife at pt's bedside and she is in full agreement.  I advised wife that if pt. shows adequate progress at [facility], he may become appropriate for CIR.  We are able to admit from Ltac if pt. is deemed appropriate.  I will sign off.  I have discussed with Junius Creamer, CM.    Weldon Picking PT Inpatient Rehab Admissions Coordinator Cell [phone] Office [phone]

## 2015-07-08 NOTE — Progress Notes (Signed)
Physical Therapy Treatment Patient Details Name: Jonathan Le MRN: [id number] DOB: Mar 15, 1946 Today's Date: 07-24-15    History of Present Illness Adm 8/4 with sepsis; ischemic bowel; 8/4 colectomy with abd VAC placed; 8/6 ilieostomy; extubated 8/8 PMHx-CABG 05/22/15, HTN, OA    PT Comments    Pt admitted with above diagnosis. Pt currently with functional limitations due to balance, endurance and strength deficits. Pt feeling fatigued and was not willing to do more than exercises.  Did participate in exercises.  Continue As able.  Pt going to LTAC today.  Pt will benefit from skilled PT to increase their independence and safety with mobility to allow discharge to the venue listed below.    Follow Up Recommendations  LTACH;Supervision/Assistance - 24 hour     Equipment Recommendations  Other (comment) (TBA)    Recommendations for Other Services       Precautions / Restrictions Precautions Precautions: Fall Restrictions Weight Bearing Restrictions: No Other Position/Activity Restrictions: trach collar    Mobility  Bed Mobility               General bed mobility comments: Pt declined to get to EOB and somewhat lethargic.  Did agree with exercises.    Transfers                    Ambulation/Gait                 Stairs            Wheelchair Mobility    Modified Rankin (Stroke Patients Only)       Balance                                    Cognition Arousal/Alertness: Lethargic Behavior During Therapy: Flat affect Overall Cognitive Status: Within Functional Limits for tasks assessed Area of Impairment: Following commands;Problem solving       Following Commands: Follows one step commands consistently     Problem Solving: Slow processing;Decreased initiation;Difficulty sequencing;Requires verbal cues;Requires tactile cues General Comments: Patient able to communicate effectively with P-M valve    Exercises General  Exercises - Upper Extremity Shoulder Flexion: AROM;Both;10 reps;Supine Elbow Flexion: AROM;Both;10 reps;Supine General Exercises - Lower Extremity Ankle Circles/Pumps: AROM;Both;10 reps;Supine Heel Slides: AAROM;Both;15 reps;Supine Hip ABduction/ADduction: AAROM;Both;10 reps;Supine    General Comments        Pertinent Vitals/Pain Pain Assessment: No/denies pain  VSS    Home Living                      Prior Function            PT Goals (current goals can now be found in the care plan section) Progress towards PT goals: Progressing toward goals    Frequency  Min 3X/week    PT Plan Current plan remains appropriate    Co-evaluation             End of Session Equipment Utilized During Treatment: Gait belt;Oxygen Activity Tolerance: Patient limited by fatigue Patient left: in bed;with call bell/phone within reach;with bed alarm set;with family/visitor present     Time: 7829-5621 PT Time Calculation (min) (ACUTE ONLY): 18 min  Charges:  $Therapeutic Exercise: 8-22 mins                    G Codes:      Xuan Mateus F 2015-07-24, 1:48 PM Carlis Blanchard [employer]  Acute Rehabilitation [phone] (pager)

## 2015-07-08 NOTE — Progress Notes (Signed)
Nutrition Follow-up  DOCUMENTATION CODES:   Morbid obesity  INTERVENTION:   -Magic Cup TID with meals -MVI daily  NUTRITION DIAGNOSIS:   Increased nutrient needs related to wound healing as evidenced by estimated needs.  Ongoing  GOAL:   Patient will meet greater than or equal to 90% of their needs  Progressing  MONITOR:   PO intake, Supplement acceptance, Diet advancement, Labs, Weight trends, Skin, I & O's  REASON FOR ASSESSMENT:   Consult Enteral/tube feeding initiation and management  ASSESSMENT:   [name] s/p CABG 7/8 presented to ED 8/4 after losing consciousness on toilet. In ED he was hypoxic requiring NRB and hypotensive. PCCM called for admission.   S/p Procedure(s) on 07/03/15: TRACHEOSTOMY  Pt remains on trach collar. Per RN, pt did not require vent last night.  Pt s/p MBSS on 07/07/15. TF has been d/c, as put has been advanced to a dysphagia 1 diet with honey thick liquids. SLP continues with PSMV trials. Spoke with RN, who reports pt is tolerating current diet well, but needs assistance with feeding. Meal completion 50-75%.  Abdominal vac was d/c on 07/02/15. Pt has been transitioned to wet to dry dressings. Reviewed wound documentation: pt with closed abdominal incision, stage I pressure ulcer on coccyx, DTI to lt nose, and open rt foot wound.   Pt remains with rt RLQ ileostomy. Noted 250 ml output this shift per doc flowsheets.   Labs reviewed: Na: 149, K: 2.8 (on IV supplementation).   Diet Order:  DIET - DYS 1 Room service appropriate?: Yes; Fluid consistency:: Honey Thick  Skin:  Wound (see comment) (open wound rt foot, DTI lt nose, st II coccyx, closed abd in)  Last BM:  07/08/15  Height:   Ht Readings from Last 1 Encounters:  06/30/15  (1.753 m)    Weight:   Wt Readings from Last 1 Encounters:  07/08/15 257 lb 0.9 oz (116.6 kg)    Ideal Body Weight:  72.7 kg  BMI:  Body mass index is 37.94 kg/(m^2).  Estimated  Nutritional Needs:   Kcal:  2200-2400  Protein:  130-145 grams  Fluid:  per MD  EDUCATION NEEDS:   No education needs identified at this time  Watt Geiler A. Mayford Knife, RD, LDN, CDE Pager: [phone] After hours Pager: [phone]

## 2015-07-09 LAB — CBC
HCT: 37.2 % — ABNORMAL LOW (ref 39.0–52.0)
Hemoglobin: 11.2 g/dL — ABNORMAL LOW (ref 13.0–17.0)
MCH: 27.7 pg (ref 26.0–34.0)
MCHC: 30.1 g/dL (ref 30.0–36.0)
MCV: 91.9 fL (ref 78.0–100.0)
PLATELETS: 213 10*3/uL (ref 150–400)
RBC: 4.05 MIL/uL — AB (ref 4.22–5.81)
RDW: 16.3 % — AB (ref 11.5–15.5)
WBC: 7.8 10*3/uL (ref 4.0–10.5)

## 2015-07-09 LAB — BASIC METABOLIC PANEL
Anion gap: 8 (ref 5–15)
BUN: 20 mg/dL (ref 6–20)
CALCIUM: 8.4 mg/dL — AB (ref 8.9–10.3)
CO2: 28 mmol/L (ref 22–32)
CREATININE: 0.53 mg/dL — AB (ref 0.61–1.24)
Chloride: 117 mmol/L — ABNORMAL HIGH (ref 101–111)
GFR calc Af Amer: >60 mL/min (ref >60)
GLUCOSE: 117 mg/dL — AB (ref 65–99)
POTASSIUM: 3.8 mmol/L (ref 3.5–5.1)
SODIUM: 153 mmol/L — AB (ref 135–145)

## 2015-07-09 LAB — BRAIN NATRIURETIC PEPTIDE: B Natriuretic Peptide: 291.8 pg/mL — ABNORMAL HIGH (ref 0.0–100.0)

## 2015-07-09 LAB — PROCALCITONIN: Procalcitonin: 0.17 ng/mL

## 2015-07-09 LAB — C-REACTIVE PROTEIN: CRP: 13.4 mg/dL — AB (ref <1.0)

## 2015-07-10 LAB — BASIC METABOLIC PANEL
ANION GAP: 6 (ref 5–15)
BUN: 17 mg/dL (ref 6–20)
CALCIUM: 8.3 mg/dL — AB (ref 8.9–10.3)
CO2: 29 mmol/L (ref 22–32)
Chloride: 116 mmol/L — ABNORMAL HIGH (ref 101–111)
Creatinine, Ser: 0.55 mg/dL — ABNORMAL LOW (ref 0.61–1.24)
GFR calc non Af Amer: >60 mL/min (ref >60)
Glucose, Bld: 126 mg/dL — ABNORMAL HIGH (ref 65–99)
POTASSIUM: 3.3 mmol/L — AB (ref 3.5–5.1)
Sodium: 151 mmol/L — ABNORMAL HIGH (ref 135–145)

## 2015-07-10 LAB — CULTURE, BLOOD (ROUTINE X 2)
CULTURE: NO GROWTH
CULTURE: NO GROWTH

## 2015-07-11 LAB — BASIC METABOLIC PANEL
ANION GAP: 8 (ref 5–15)
BUN: 19 mg/dL (ref 6–20)
CO2: 28 mmol/L (ref 22–32)
Calcium: 8.2 mg/dL — ABNORMAL LOW (ref 8.9–10.3)
Chloride: 110 mmol/L (ref 101–111)
Creatinine, Ser: 0.71 mg/dL (ref 0.61–1.24)
GFR calc Af Amer: >60 mL/min (ref >60)
GFR calc non Af Amer: >60 mL/min (ref >60)
GLUCOSE: 133 mg/dL — AB (ref 65–99)
Potassium: 3.3 mmol/L — ABNORMAL LOW (ref 3.5–5.1)
Sodium: 146 mmol/L — ABNORMAL HIGH (ref 135–145)

## 2015-07-12 LAB — BASIC METABOLIC PANEL
ANION GAP: 5 (ref 5–15)
BUN: 22 mg/dL — ABNORMAL HIGH (ref 6–20)
CALCIUM: 8.2 mg/dL — AB (ref 8.9–10.3)
CO2: 27 mmol/L (ref 22–32)
CREATININE: 0.56 mg/dL — AB (ref 0.61–1.24)
Chloride: 110 mmol/L (ref 101–111)
GFR calc non Af Amer: >60 mL/min (ref >60)
Glucose, Bld: 115 mg/dL — ABNORMAL HIGH (ref 65–99)
Potassium: 3.3 mmol/L — ABNORMAL LOW (ref 3.5–5.1)
SODIUM: 142 mmol/L (ref 135–145)

## 2015-07-12 LAB — MAGNESIUM: MAGNESIUM: 2.1 mg/dL (ref 1.7–2.4)

## 2015-07-13 LAB — BASIC METABOLIC PANEL
Anion gap: 5 (ref 5–15)
BUN: 20 mg/dL (ref 6–20)
CALCIUM: 7.7 mg/dL — AB (ref 8.9–10.3)
CO2: 25 mmol/L (ref 22–32)
Chloride: 102 mmol/L (ref 101–111)
Creatinine, Ser: 0.65 mg/dL (ref 0.61–1.24)
GFR calc Af Amer: >60 mL/min (ref >60)
GLUCOSE: 414 mg/dL — AB (ref 65–99)
POTASSIUM: 3.5 mmol/L (ref 3.5–5.1)
Sodium: 132 mmol/L — ABNORMAL LOW (ref 135–145)

## 2015-07-13 LAB — CBC
HEMATOCRIT: 32.1 % — AB (ref 39.0–52.0)
Hemoglobin: 9.8 g/dL — ABNORMAL LOW (ref 13.0–17.0)
MCH: 27.3 pg (ref 26.0–34.0)
MCHC: 30.5 g/dL (ref 30.0–36.0)
MCV: 89.4 fL (ref 78.0–100.0)
Platelets: 200 10*3/uL (ref 150–400)
RBC: 3.59 MIL/uL — ABNORMAL LOW (ref 4.22–5.81)
RDW: 16.4 % — AB (ref 11.5–15.5)
WBC: 7.4 10*3/uL (ref 4.0–10.5)

## 2015-07-15 ENCOUNTER — Other Ambulatory Visit (HOSPITAL_COMMUNITY): Payer: Self-pay

## 2015-07-15 LAB — BASIC METABOLIC PANEL
Anion gap: 6 (ref 5–15)
BUN: 31 mg/dL — AB (ref 6–20)
CHLORIDE: 116 mmol/L — AB (ref 101–111)
CO2: 23 mmol/L (ref 22–32)
Calcium: 8.2 mg/dL — ABNORMAL LOW (ref 8.9–10.3)
Creatinine, Ser: 0.66 mg/dL (ref 0.61–1.24)
GFR calc Af Amer: >60 mL/min (ref >60)
GFR calc non Af Amer: >60 mL/min (ref >60)
GLUCOSE: 118 mg/dL — AB (ref 65–99)
POTASSIUM: 4 mmol/L (ref 3.5–5.1)
Sodium: 145 mmol/L (ref 135–145)

## 2015-07-16 LAB — CBC
HCT: 31.9 % — ABNORMAL LOW (ref 39.0–52.0)
Hemoglobin: 9.6 g/dL — ABNORMAL LOW (ref 13.0–17.0)
MCH: 27 pg (ref 26.0–34.0)
MCHC: 30.1 g/dL (ref 30.0–36.0)
MCV: 89.6 fL (ref 78.0–100.0)
PLATELETS: 272 10*3/uL (ref 150–400)
RBC: 3.56 MIL/uL — AB (ref 4.22–5.81)
RDW: 16.7 % — AB (ref 11.5–15.5)
WBC: 7 10*3/uL (ref 4.0–10.5)

## 2015-07-16 LAB — TSH: TSH: 2.316 u[IU]/mL (ref 0.350–4.500)

## 2015-07-17 LAB — CBC
HCT: 34.1 % — ABNORMAL LOW (ref 39.0–52.0)
Hemoglobin: 10.5 g/dL — ABNORMAL LOW (ref 13.0–17.0)
MCH: 27.4 pg (ref 26.0–34.0)
MCHC: 30.8 g/dL (ref 30.0–36.0)
MCV: 89 fL (ref 78.0–100.0)
PLATELETS: 308 10*3/uL (ref 150–400)
RBC: 3.83 MIL/uL — ABNORMAL LOW (ref 4.22–5.81)
RDW: 16.4 % — AB (ref 11.5–15.5)
WBC: 8.5 10*3/uL (ref 4.0–10.5)

## 2015-07-17 LAB — BASIC METABOLIC PANEL
ANION GAP: 7 (ref 5–15)
BUN: 48 mg/dL — AB (ref 6–20)
CALCIUM: 8.7 mg/dL — AB (ref 8.9–10.3)
CO2: 22 mmol/L (ref 22–32)
Chloride: 115 mmol/L — ABNORMAL HIGH (ref 101–111)
Creatinine, Ser: 0.87 mg/dL (ref 0.61–1.24)
GFR calc Af Amer: >60 mL/min (ref >60)
GLUCOSE: 124 mg/dL — AB (ref 65–99)
Potassium: 4 mmol/L (ref 3.5–5.1)
SODIUM: 144 mmol/L (ref 135–145)

## 2015-07-19 ENCOUNTER — Other Ambulatory Visit (HOSPITAL_COMMUNITY): Payer: Self-pay

## 2015-07-19 LAB — CBC
HEMATOCRIT: 37 % — AB (ref 39.0–52.0)
Hemoglobin: 11.1 g/dL — ABNORMAL LOW (ref 13.0–17.0)
MCH: 26.8 pg (ref 26.0–34.0)
MCHC: 30 g/dL (ref 30.0–36.0)
MCV: 89.4 fL (ref 78.0–100.0)
PLATELETS: 303 10*3/uL (ref 150–400)
RBC: 4.14 MIL/uL — ABNORMAL LOW (ref 4.22–5.81)
RDW: 16.6 % — ABNORMAL HIGH (ref 11.5–15.5)
WBC: 10.1 10*3/uL (ref 4.0–10.5)

## 2015-07-19 LAB — BASIC METABOLIC PANEL
Anion gap: 8 (ref 5–15)
BUN: 59 mg/dL — AB (ref 6–20)
CALCIUM: 9 mg/dL (ref 8.9–10.3)
CO2: 19 mmol/L — AB (ref 22–32)
CREATININE: 2.34 mg/dL — AB (ref 0.61–1.24)
Chloride: 123 mmol/L — ABNORMAL HIGH (ref 101–111)
GFR calc Af Amer: 31 mL/min — ABNORMAL LOW (ref >60)
GFR, EST NON AFRICAN AMERICAN: 27 mL/min — AB (ref >60)
GLUCOSE: 96 mg/dL (ref 65–99)
Potassium: 4.8 mmol/L (ref 3.5–5.1)
Sodium: 150 mmol/L — ABNORMAL HIGH (ref 135–145)

## 2015-07-19 LAB — BLOOD GAS, ARTERIAL
Acid-base deficit: 4.9 mmol/L — ABNORMAL HIGH (ref 0.0–2.0)
BICARBONATE: 18.5 meq/L — AB (ref 20.0–24.0)
O2 Content: 3 L/min
O2 Saturation: 93.7 %
PCO2 ART: 27.2 mmHg — AB (ref 35.0–45.0)
PH ART: 7.446 (ref 7.350–7.450)
PO2 ART: 71.3 mmHg — AB (ref 80.0–100.0)
Patient temperature: 98
TCO2: 19.4 mmol/L (ref 0–100)

## 2015-07-20 ENCOUNTER — Other Ambulatory Visit (HOSPITAL_COMMUNITY): Payer: Medicare Other

## 2015-07-20 ENCOUNTER — Other Ambulatory Visit (HOSPITAL_COMMUNITY): Payer: Self-pay

## 2015-07-20 DIAGNOSIS — J690 Pneumonitis due to inhalation of food and vomit: Secondary | ICD-10-CM

## 2015-07-20 DIAGNOSIS — A419 Sepsis, unspecified organism: Secondary | ICD-10-CM

## 2015-07-20 DIAGNOSIS — J9601 Acute respiratory failure with hypoxia: Secondary | ICD-10-CM

## 2015-07-20 DIAGNOSIS — R6521 Severe sepsis with septic shock: Secondary | ICD-10-CM

## 2015-07-20 LAB — CBC
HCT: 34 % — ABNORMAL LOW (ref 39.0–52.0)
HEMOGLOBIN: 10.4 g/dL — AB (ref 13.0–17.0)
MCH: 27.7 pg (ref 26.0–34.0)
MCHC: 30.6 g/dL (ref 30.0–36.0)
MCV: 90.4 fL (ref 78.0–100.0)
PLATELETS: 323 10*3/uL (ref 150–400)
RBC: 3.76 MIL/uL — AB (ref 4.22–5.81)
RDW: 16.7 % — ABNORMAL HIGH (ref 11.5–15.5)
WBC: 18.5 10*3/uL — ABNORMAL HIGH (ref 4.0–10.5)

## 2015-07-20 LAB — URINE MICROSCOPIC-ADD ON

## 2015-07-20 LAB — URINALYSIS, ROUTINE W REFLEX MICROSCOPIC
GLUCOSE, UA: NEGATIVE mg/dL
HGB URINE DIPSTICK: NEGATIVE
KETONES UR: 15 mg/dL — AB
Nitrite: NEGATIVE
PROTEIN: 30 mg/dL — AB
Specific Gravity, Urine: 1.021 (ref 1.005–1.030)
UROBILINOGEN UA: 0.2 mg/dL (ref 0.0–1.0)
pH: 5 (ref 5.0–8.0)

## 2015-07-20 LAB — BASIC METABOLIC PANEL
Anion gap: 12 (ref 5–15)
BUN: 71 mg/dL — AB (ref 6–20)
CHLORIDE: 124 mmol/L — AB (ref 101–111)
CO2: 13 mmol/L — ABNORMAL LOW (ref 22–32)
CREATININE: 2.9 mg/dL — AB (ref 0.61–1.24)
Calcium: 8.4 mg/dL — ABNORMAL LOW (ref 8.9–10.3)
GFR, EST AFRICAN AMERICAN: 24 mL/min — AB (ref >60)
GFR, EST NON AFRICAN AMERICAN: 21 mL/min — AB (ref >60)
Glucose, Bld: 129 mg/dL — ABNORMAL HIGH (ref 65–99)
POTASSIUM: 5.2 mmol/L — AB (ref 3.5–5.1)
SODIUM: 149 mmol/L — AB (ref 135–145)

## 2015-07-20 LAB — PROCALCITONIN: Procalcitonin: 2.82 ng/mL

## 2015-07-20 LAB — VANCOMYCIN, TROUGH: VANCOMYCIN TR: 10 ug/mL (ref 10.0–20.0)

## 2015-07-20 NOTE — Procedures (Signed)
Intubation Procedure Note Jonathan Le [id number] [birth year]  Procedure: Intubation Indications: Airway protection and maintenance  Procedure Details Consent: Risks of procedure as well as the alternatives and risks of each were explained to the (patient/caregiver).  Consent for procedure obtained. Time Out: Verified patient identification, verified procedure, site/side was marked, verified correct patient position, special equipment/implants available, medications/allergies/relevent history reviewed, required imaging and test results available.  Performed  Maximum sterile technique was used including gloves, hand hygiene and mask.  MAC    Evaluation Hemodynamic Status: BP stable throughout; O2 sats: stable throughout Patient's Current Condition: stable Complications: No apparent complications Patient did tolerate procedure well. Chest X-ray ordered to verify placement.  CXR: pending.   Jonathan Le 07/20/2015

## 2015-07-20 NOTE — Consult Note (Signed)
PULMONARY / CRITICAL CARE MEDICINE   Name: Jonathan Le MRN: [id number] DOB: [birth year]    ADMISSION DATE:  07/08/2015 CONSULTATION DATE: 06/18/15   CHIEF COMPLAINT:  Septic shock secondary to ischemic colon. Acute respiratiory failure. Acute renal failure.  INITIAL PRESENTATION: 69 year old male with PMH of OSA and HTN. 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 Post discharge course complicated by R leg cellulitis after endoscopic vein harvest he was seen by Dr. Donata Clay in clinic for this,was having some constipation. 8/4 PM he took an enema and was straining in bathroom. He became diaphoretic and had loss of consciousness. In ED he was noted to be profoundly hypotensive with SOB. Found to be in severe septic shock with ischemic colon s/p total abd colectomy on 8/4  STUDIES:  Echo (8/4) estimated ejection fraction was in the range of 25% to 30%. Severe hypokinesis of the anteroseptal and anterior myocardium.Dyskinesis of the apical myocardium. .  CT chest abd pelvis (8/4) CTA CHEST: Limited CTA of the chest, poor bolus timing. Moderate to severe cardiomegaly with small pericardial effusion.Evidence of pulmonary edema with small pleural effusions and, patchy airspace opacities most consistent with confluent edema.  CT ABDOMEN AND PELVIS: Focal colonic hepatic flexure pneumatosis without bowel perforation. Moderate to large amount of distal retained large bowel stool. Mild probable ileus, no bowel obstruction.  Bilateral nonobstructing nephrolithiasis measuring up to 4 mm.  CT abdomen 8/12 >> no evidence intra-abd abscess, ascites present, GB sludge, small B pleural effusions.   SIGNIFICANT EVENTS: 8/4- Total abdominal colectomy with abdominal vac placement 8/6 Exploratory laparotomy, removal of  prior packs, closure of open abdomen, ileostomy 8/7 high pressors 8/8 self extubated 8/9  resp distress , required bipap , lasix 8/13 had to restart levophed for shock overnight. 8/16 reintubated, primarily for MS, agitation, tachypnea 8/19 Trach placed 8/22 tolerating TC. 8/23 to [facility] 9/5 PCCM called back for aspiration PNA and respiratory failure.  SUBJECTIVE:   Self decannualted, aspirated and now in respiratory failure and encephalopathic.  VITAL SIGNS:     HEMODYNAMICS:    There were no vitals filed for this visit. SBP 76/45 on 20 mcg of levophed. O2 sat 98% on 4L   VENTILATOR SETTINGS:  PRVC  INTAKE / OUTPUT: No intake or output data in the 24 hours ending 07/20/15 1729  PHYSICAL EXAMINATION: General: Severely encephalopathic, not protecting his airway properly.  Neuro: moving all ext to pain, not command. Head: [place]/AT EENT: PERRL, no JVD noted, trach site clean and sealed. Cardiovascular: Regular Rate and rhythm. No MRG Lungs:Transmitted upper airway throughout the lung field. Abdomen: Soft, non-tender, surgical dressing in place Ext: 2+ edema  LABS:  CBC  Recent Labs Lab 07/17/15 0733 07/19/15 1532 07/20/15 0550  WBC 8.5 10.1 18.5*  HGB 10.5* 11.1* 10.4*  HCT 34.1* 37.0* 34.0*  PLT 308 303 323   Coag's No results for input(s): APTT, INR in the last 168 hours. BMET  Recent Labs Lab 07/17/15 0733 07/19/15 1532 07/20/15 0550  NA 144 150* 149*  K 4.0 4.8 5.2*  CL 115* 123* 124*  CO2 22 19* 13*  BUN 48* 59* 71*  CREATININE 0.87 2.34* 2.90*  GLUCOSE 124* 96 129*   Electrolytes  Recent Labs Lab 07/17/15 0733 07/19/15 1532 07/20/15 0550  CALCIUM 8.7* 9.0 8.4*  Sepsis Markers  Recent Labs Lab 07/20/15 0857  PROCALCITON 2.82   ABG  Recent Labs Lab 07/19/15 1130  PHART 7.446  PCO2ART 27.2*  PO2ART 71.3*   Liver Enzymes No results for input(s): AST, ALT, ALKPHOS, BILITOT, ALBUMIN in the last 168 hours. Cardiac Enzymes No  results for input(s): TROPONINI, PROBNP in the last 168 hours. Glucose No results for input(s): GLUCAP in the last 168 hours. Imaging No results found. I reviewed CXR from 8/21 myself>> cardiomegaly, BB atx   ASSESSMENT / PLAN:  PULMONARY ETT 8/4 >>8/8 ETT 8/16 >> 8/19 Trach 8/19 (Dr Pollyann Kennedy) >> self decannulated exact date unknown. ETT 9/5>>>  A: Acute resp failure in setting of septic shock with ischemic bowel  OSA Aspiration PNA. Reintubated 9/6 for respiratory failure hypoxemic due to aspiration and inability to protect his airway. P:   Reintubate. Full vent support. F/U CXR and ABG now and in AM. Titrate O2 for sat of 88-92%. Abx as below.  CARDIOVASCULAR A: Septic shock on pressors Ischemic cardiomyopathy -H/O CABG on 7/16 P:  Back on norepi for septic shock. Titrate Norepi for MAP of 65 mmHg. Treat infection as below. Hold diureses.  RENAL A:  AKI, improved Hypomag Hypokalemia  Elevated bicarb. P:   Replace electrolytes as indicated.  Follow BMP daily. Hold further lasix for now.  GASTROINTESTINAL A:  s/p total colectomy for ischemic bowel S/p  ileostomy  P:   TF per nutrition Protonix for SUP.  HEMATOLOGIC A: Anemia Leukocytosis - improved P:  Transfusion only if HB <7.  INFECTIOUS A:  Septic shock from ischemic bowel. R LE ulcer, chronic Fever spike 8/21 >check bc /sputum  P:   Abx as ordered. Pan culture and follow up.  ENDOCRINE A:  Hyperglycemia without h/o DM P:  SSI   NEUROLOGIC A: Pain, Anxiety  Deconditioning Metabolic encephalopathy P:   Minimize sedation as able. Will need some sedation since now intubated however.  The patient is critically ill with multiple organ systems failure and requires high complexity decision making for assessment and support, frequent evaluation and titration of therapies, application of advanced monitoring technologies and extensive interpretation of multiple databases.   Critical Care Time  devoted to patient care services described in this note is  35  Minutes. This time reflects time of care of this signee Dr [name]. This critical care time does not reflect procedure time, or teaching time or supervisory time of PA/NP/Med student/Med Resident etc but could involve care discussion time.  Alyson Reedy, M.D. [facility]Critical Care Medicine. Pager: [phone]. After hours pager: [phone].

## 2015-07-20 NOTE — Consult Note (Signed)
[facility]  CONSULT NOTE    Date: 07/20/2015                  Patient Name:  Jonathan Le  MRN: [id number]  DOB: Aug 28, 1946  Age / Sex: 69 y.o., male         PCP: Elizabeth Palau, FNP                 Service Requesting Consult: Dr. Sharyon Medicus                 Reason for Consult: Acute Renal Failure            History of Present Illness: [name] is a 69 y.o. white male with coronary artery disease status post CABG, hypertension, hyperlipidemia, atrial fibrillation, obstructive sleep apnea, glaucoma, BPH,  who was admitted to [facility] on 8/24 after prolonged hospitalization for sepsis with left leg cellulitis and ischemic bowel status post colectomy. Nephrology consulted for acute renal failure. Brother and sister at bedside. Wife is HCPOA.  Patient started to have altered mental status starting last few days. His serum creatinine baseline of less than 1 and now rising to 2.9 today.  Placed on levofloxacin for hypotension Placed on 1/2NS for hypernatremia.    Medications: Outpatient medications: Prescriptions prior to admission  Medication Sig Dispense Refill Last Dose  . Amino Acids-Protein Hydrolys (FEEDING SUPPLEMENT, PRO-STAT SUGAR FREE 64,) LIQD Place 30 mLs into feeding tube 3 (three) times daily. 900 mL 0   . aspirin EC 325 MG EC tablet Take 1 tablet (325 mg total) by mouth daily. 30 tablet 0 06/17/2015 at Unknown time  . atorvastatin (LIPITOR) 80 MG tablet Take 1 tablet (80 mg total) by mouth daily at 6 PM. 30 tablet 3 06/17/2015 at Unknown time  . chlorhexidine gluconate (PERIDEX) 0.12 % solution 15 mLs by Mouth Rinse route 2 (two) times daily. 120 mL 0   . collagenase (SANTYL) ointment Apply topically daily. Apply to R foot wound daily 15 g 0   . enoxaparin (LOVENOX) 30 MG/0.3ML injection Inject 0.3 mLs (30 mg total) into the skin daily. 0 Syringe    . finasteride (PROSCAR) 5 MG tablet Take 5 mg by mouth daily.   06/17/2015 at Unknown time   . insulin aspart (NOVOLOG) 100 UNIT/ML injection Inject 2-6 Units into the skin 3 (three) times daily with meals. 10 mL 11   . Maltodextrin-Xanthan Gum (RESOURCE THICKENUP CLEAR) POWD As needed for honey thick liquid consistency     . Multiple Vitamin (MULTIVITAMIN WITH MINERALS) TABS tablet Take 1 tablet by mouth daily.     . pantoprazole sodium (PROTONIX) 40 mg/20 mL PACK Place 20 mLs (40 mg total) into feeding tube at bedtime. 30 each    . sodium chloride 0.9 % infusion Inject 10 mLs into the vein continuous.  0   . white petrolatum (VASELINE) GEL Apply 1 application topically daily. Apply to L nare wound daily  0     Current medications: No current facility-administered medications for this encounter.      Allergies: Allergies  Allergen Reactions  . Levaquin [Levofloxacin] Nausea Only  . Penicillins Hives  . Doxycycline Rash      Past Medical History: Past Medical History  Diagnosis Date  . Anxiety   . Hypertension   . Arthritis   . Sleep apnea     wears CPAP nightly  . Glaucoma   . CHF (congestive heart failure)  MI, with open heart surgery x 30 sdays ago per pt. (06/26/15)     Past Surgical History: Past Surgical History  Procedure Laterality Date  . Hernia repair      right  . Eye surgery      bil cataract  . Wisdom tooth extraction    . Fasciotomy foot / toe Left     foot  . Cardiac catheterization N/A 05/20/2015    Procedure: Left Heart Cath and Coronary Angiography;  Surgeon: Kathleene Hazel, MD;  Location: [facility] INVASIVE CV LAB;  Service: Cardiovascular;  Laterality: N/A;  . Coronary artery bypass graft N/A 05/22/2015    Procedure: CORONARY ARTERY BYPASS GRAFTING (CABG), ON PUMP, TIMES THREE, USING LEFT INTERNAL MAMMARY ARTERY, RIGHT GREATER SAPHENOUS VEIN HARVESTED ENDOSCOPICALLY;  Surgeon: Kerin Perna, MD;  Location: [facility] OR;  Service: Open Heart Surgery;  Laterality: N/A;  LIMA-LAD, SVG-OM, SVG-PD  . Tee without cardioversion N/A 05/22/2015     Procedure: TRANSESOPHAGEAL ECHOCARDIOGRAM (TEE);  Surgeon: Kerin Perna, MD;  Location: [facility] OR;  Service: Open Heart Surgery;  Laterality: N/A;  . Laparotomy N/A 06/18/2015    Procedure: EXPLORATORY LAPAROTOMY;  Surgeon: Emelia Loron, MD;  Location: [facility] OR;  Service: General;  Laterality: N/A;  . Laparotomy N/A 06/20/2015    Procedure: WASHOUT OF ABDOMEN, CLOSURE OF ABDOMINAL WALL WITH PLACEMENT OF WOUND VAC AND ILLEOSTOMY ;  Surgeon: Almond Lint, MD;  Location: [facility] OR;  Service: General;  Laterality: N/A;  . Tracheostomy tube placement N/A 07/03/2015    Procedure: TRACHEOSTOMY;  Surgeon: Serena Colonel, MD;  Location: [facility] OR;  Service: ENT;  Laterality: N/A;     Family History: No family history on file.   Social History: Social History   Social History  . Marital Status: Married    Spouse Name: N/A  . Number of Children: N/A  . Years of Education: N/A   Occupational History  . Not on file.   Social History Main Topics  . Smoking status: Never Smoker   . Smokeless tobacco: Never Used  . Alcohol Use: No  . Drug Use: No  . Sexual Activity: Not Currently   Other Topics Concern  . Not on file   Social History Narrative     Review of Systems: Review of Systems  Unable to perform ROS: critical illness    Vital Signs: There were no vitals taken for this visit.  Weight trends: There were no vitals filed for this visit.  Physical Exam: General: Critically ill  Head: Normocephalic, atraumatic  Eyes: Anicteric, PERRL  Neck: Tracheostomy scar - in dressings  Lungs:  Bilateral rhonchi  Heart: Regular rate and rhythm  Abdomen:  +colostomy  Extremities: trace peripheral edema.  Neurologic: Moving all extremities, not answering questions or following commands  Skin: No lesions  GU: Foley      Lab results: Basic Metabolic Panel:  Recent Labs Lab 07/17/15 0733 07/19/15 1532 07/20/15 0550  NA 144 150* 149*  K 4.0 4.8 5.2*  CL 115* 123* 124*  CO2 22 19* 13*   GLUCOSE 124* 96 129*  BUN 48* 59* 71*  CREATININE 0.87 2.34* 2.90*  CALCIUM 8.7* 9.0 8.4*    Liver Function Tests: No results for input(s): AST, ALT, ALKPHOS, BILITOT, PROT, ALBUMIN in the last 168 hours. No results for input(s): LIPASE, AMYLASE in the last 168 hours. No results for input(s): AMMONIA in the last 168 hours.  CBC:  Recent Labs Lab 07/16/15 0545 07/17/15 4540 07/19/15 1532 07/20/15 0550  WBC 7.0 8.5 10.1 18.5*  HGB 9.6* 10.5* 11.1* 10.4*  HCT 31.9* 34.1* 37.0* 34.0*  MCV 89.6 89.0 89.4 90.4  PLT 272 308 303 323    Cardiac Enzymes: No results for input(s): CKTOTAL, CKMB, CKMBINDEX, TROPONINI in the last 168 hours.  BNP: Invalid input(s): POCBNP  CBG: No results for input(s): GLUCAP in the last 168 hours.  Microbiology: Results for orders placed or performed during the hospital encounter of 07/08/15  Culture, blood (routine x 2)     Status: None (Preliminary result)   Collection Time: 07/19/15 10:18 PM  Result Value Ref Range Status   Specimen Description BLOOD PICC LINE  Final   Special Requests BOTTLES DRAWN AEROBIC AND ANAEROBIC 10CC  Final   Culture NO GROWTH < 24 HOURS  Final   Report Status PENDING  Incomplete  Culture, blood (routine x 2)     Status: None (Preliminary result)   Collection Time: 07/19/15 10:19 PM  Result Value Ref Range Status   Specimen Description BLOOD LEFT HAND  Final   Special Requests BOTTLES DRAWN AEROBIC AND ANAEROBIC 5CC  Final   Culture NO GROWTH < 24 HOURS  Final   Report Status PENDING  Incomplete    Coagulation Studies: No results for input(s): LABPROT, INR in the last 72 hours.  Urinalysis:  Recent Labs  07/19/15 2310  COLORURINE ORANGE*  LABSPEC 1.021  PHURINE 5.0  GLUCOSEU NEGATIVE  HGBUR NEGATIVE  BILIRUBINUR MODERATE*  KETONESUR 15*  PROTEINUR 30*  UROBILINOGEN 0.2  NITRITE NEGATIVE  LEUKOCYTESUR SMALL*      Imaging: Dg Chest Port 1 View  07/19/2015   CLINICAL DATA:  Rule out pneumonia.   EXAM: PORTABLE CHEST - 1 VIEW  COMPARISON:  07/07/2015  FINDINGS: There is bibasilar hazy airspace disease. There is no pleural effusion or pneumothorax. The heart mediastinum are stable. There is evidence of prior CABG.  The osseous structures are unremarkable.  IMPRESSION: Bibasilar hazy airspace disease which may reflect atelectasis versus pneumonia.   Electronically Signed   By: Elige Ko   On: 07/19/2015 14:36      Assessment & Plan: [name] is a 69 y.o. white male with coronary artery disease status post CABG, hypertension, hyperlipidemia, atrial fibrillation, obstructive sleep apnea, glaucoma, BPH,  who was admitted to [facility] on 8/24 after prolonged hospitalization for sepsis with left leg cellulitis and ischemic bowel status post colectomy.  1. Acute Renal Failure: with proteinuria: concerning with baseline creatinine of less than 1. Creatinine of 0.87 on 9/2. With some urine output. No contrast exposure. Hypotensive episodes since 9/2.  - Check renal ultrasound - Concerning for acute renal failure from hypotension and possibly sepsis.  - No acute indication for dialysis currently.   2. Hypotension with leukocytosis: concern for sepsis - discontinue lisinopril - on norepinephrine - empiric antibiotics: vancomycin and meropenem.   3. Anemia: hemoglobin 10.4  Will need establish code status.     LOS:  Lamont Dowdy 9/5/20164:18 PM

## 2015-07-21 ENCOUNTER — Inpatient Hospital Stay (HOSPITAL_COMMUNITY): Payer: Medicare Other

## 2015-07-21 ENCOUNTER — Inpatient Hospital Stay (HOSPITAL_COMMUNITY)
Admission: RE | Admit: 2015-07-21 | Discharge: 2015-07-25 | DRG: 004 | Disposition: A | Discharge status: Other Acute Inpatient Hospital | Payer: Medicare Other | Source: Ambulatory Visit | Attending: Pulmonary Disease | Admitting: Pulmonary Disease

## 2015-07-21 ENCOUNTER — Encounter (HOSPITAL_COMMUNITY): Payer: Self-pay

## 2015-07-21 DIAGNOSIS — G9341 Metabolic encephalopathy: Secondary | ICD-10-CM | POA: Diagnosis present

## 2015-07-21 DIAGNOSIS — R791 Abnormal coagulation profile: Secondary | ICD-10-CM | POA: Diagnosis present

## 2015-07-21 DIAGNOSIS — F419 Anxiety disorder, unspecified: Secondary | ICD-10-CM | POA: Diagnosis present

## 2015-07-21 DIAGNOSIS — R739 Hyperglycemia, unspecified: Secondary | ICD-10-CM | POA: Diagnosis present

## 2015-07-21 DIAGNOSIS — K559 Vascular disorder of intestine, unspecified: Secondary | ICD-10-CM | POA: Diagnosis present

## 2015-07-21 DIAGNOSIS — N179 Acute kidney failure, unspecified: Secondary | ICD-10-CM | POA: Diagnosis present

## 2015-07-21 DIAGNOSIS — Z4659 Encounter for fitting and adjustment of other gastrointestinal appliance and device: Secondary | ICD-10-CM | POA: Insufficient documentation

## 2015-07-21 DIAGNOSIS — Z932 Ileostomy status: Secondary | ICD-10-CM | POA: Diagnosis not present

## 2015-07-21 DIAGNOSIS — D649 Anemia, unspecified: Secondary | ICD-10-CM | POA: Diagnosis present

## 2015-07-21 DIAGNOSIS — Z452 Encounter for adjustment and management of vascular access device: Secondary | ICD-10-CM | POA: Diagnosis not present

## 2015-07-21 DIAGNOSIS — J9600 Acute respiratory failure, unspecified whether with hypoxia or hypercapnia: Secondary | ICD-10-CM | POA: Diagnosis not present

## 2015-07-21 DIAGNOSIS — L0291 Cutaneous abscess, unspecified: Secondary | ICD-10-CM | POA: Diagnosis not present

## 2015-07-21 DIAGNOSIS — J690 Pneumonitis due to inhalation of food and vomit: Secondary | ICD-10-CM | POA: Diagnosis present

## 2015-07-21 DIAGNOSIS — K9189 Other postprocedural complications and disorders of digestive system: Principal | ICD-10-CM | POA: Diagnosis present

## 2015-07-21 DIAGNOSIS — Z9911 Dependence on respirator [ventilator] status: Secondary | ICD-10-CM | POA: Diagnosis not present

## 2015-07-21 DIAGNOSIS — L89323 Pressure ulcer of left buttock, stage 3: Secondary | ICD-10-CM | POA: Diagnosis present

## 2015-07-21 DIAGNOSIS — I255 Ischemic cardiomyopathy: Secondary | ICD-10-CM | POA: Diagnosis present

## 2015-07-21 DIAGNOSIS — I10 Essential (primary) hypertension: Secondary | ICD-10-CM | POA: Diagnosis present

## 2015-07-21 DIAGNOSIS — J9620 Acute and chronic respiratory failure, unspecified whether with hypoxia or hypercapnia: Secondary | ICD-10-CM | POA: Diagnosis not present

## 2015-07-21 DIAGNOSIS — D696 Thrombocytopenia, unspecified: Secondary | ICD-10-CM | POA: Diagnosis present

## 2015-07-21 DIAGNOSIS — K651 Peritoneal abscess: Secondary | ICD-10-CM | POA: Diagnosis present

## 2015-07-21 DIAGNOSIS — Y838 Other surgical procedures as the cause of abnormal reaction of the patient, or of later complication, without mention of misadventure at the time of the procedure: Secondary | ICD-10-CM | POA: Diagnosis present

## 2015-07-21 DIAGNOSIS — E872 Acidosis: Secondary | ICD-10-CM | POA: Diagnosis present

## 2015-07-21 DIAGNOSIS — L89159 Pressure ulcer of sacral region, unspecified stage: Secondary | ICD-10-CM | POA: Diagnosis present

## 2015-07-21 DIAGNOSIS — R680 Hypothermia, not associated with low environmental temperature: Secondary | ICD-10-CM | POA: Diagnosis not present

## 2015-07-21 DIAGNOSIS — R6521 Severe sepsis with septic shock: Secondary | ICD-10-CM | POA: Diagnosis present

## 2015-07-21 DIAGNOSIS — G4733 Obstructive sleep apnea (adult) (pediatric): Secondary | ICD-10-CM | POA: Diagnosis present

## 2015-07-21 DIAGNOSIS — Z01818 Encounter for other preprocedural examination: Secondary | ICD-10-CM | POA: Insufficient documentation

## 2015-07-21 DIAGNOSIS — Z951 Presence of aortocoronary bypass graft: Secondary | ICD-10-CM

## 2015-07-21 DIAGNOSIS — E870 Hyperosmolality and hypernatremia: Secondary | ICD-10-CM | POA: Diagnosis present

## 2015-07-21 DIAGNOSIS — I82409 Acute embolism and thrombosis of unspecified deep veins of unspecified lower extremity: Secondary | ICD-10-CM | POA: Diagnosis not present

## 2015-07-21 DIAGNOSIS — A419 Sepsis, unspecified organism: Secondary | ICD-10-CM | POA: Diagnosis present

## 2015-07-21 DIAGNOSIS — IMO0002 Reserved for concepts with insufficient information to code with codable children: Secondary | ICD-10-CM

## 2015-07-21 DIAGNOSIS — E876 Hypokalemia: Secondary | ICD-10-CM | POA: Diagnosis not present

## 2015-07-21 DIAGNOSIS — L89313 Pressure ulcer of right buttock, stage 3: Secondary | ICD-10-CM | POA: Diagnosis present

## 2015-07-21 DIAGNOSIS — J9690 Respiratory failure, unspecified, unspecified whether with hypoxia or hypercapnia: Secondary | ICD-10-CM

## 2015-07-21 DIAGNOSIS — Z930 Tracheostomy status: Secondary | ICD-10-CM | POA: Diagnosis not present

## 2015-07-21 LAB — URINE MICROSCOPIC-ADD ON

## 2015-07-21 LAB — URINE CULTURE: CULTURE: NO GROWTH

## 2015-07-21 LAB — BLOOD GAS, ARTERIAL
ACID-BASE DEFICIT: 11 mmol/L — AB (ref 0.0–2.0)
ACID-BASE DEFICIT: 13.5 mmol/L — AB (ref 0.0–2.0)
Bicarbonate: 15.1 mEq/L — ABNORMAL LOW (ref 20.0–24.0)
Bicarbonate: 11.6 mEq/L — ABNORMAL LOW (ref 20.0–24.0)
DRAWN BY: 345601
FIO2: 0.5
FIO2: 0.4
MECHVT: 560 mL
O2 Saturation: 96.7 %
O2 Saturation: 96.5 %
PATIENT TEMPERATURE: 98.6
PCO2 ART: 37.2 mmHg (ref 35.0–45.0)
PEEP/CPAP: 5 cmH2O
PH ART: 7.232 — AB (ref 7.350–7.450)
PO2 ART: 98.3 mmHg (ref 80.0–100.0)
Patient temperature: 98.6
RATE: 14 resp/min
TCO2: 16.2 mmol/L (ref 0–100)
TCO2: 12.3 mmol/L (ref 0–100)
pCO2 arterial: 23.9 mmHg — ABNORMAL LOW (ref 35.0–45.0)
pH, Arterial: 7.308 — ABNORMAL LOW (ref 7.350–7.450)
pO2, Arterial: 115 mmHg — ABNORMAL HIGH (ref 80.0–100.0)

## 2015-07-21 LAB — CBC WITH DIFFERENTIAL/PLATELET
BASOS ABS: 0 10*3/uL (ref 0.0–0.1)
Basophils Relative: 0 % (ref 0–1)
Eosinophils Absolute: 0.2 10*3/uL (ref 0.0–0.7)
Eosinophils Relative: 1 % (ref 0–5)
HCT: 38.2 % — ABNORMAL LOW (ref 39.0–52.0)
Hemoglobin: 11.5 g/dL — ABNORMAL LOW (ref 13.0–17.0)
LYMPHS ABS: 2.4 10*3/uL (ref 0.7–4.0)
Lymphocytes Relative: 13 % (ref 12–46)
MCH: 26.6 pg (ref 26.0–34.0)
MCHC: 30.1 g/dL (ref 30.0–36.0)
MCV: 88.4 fL (ref 78.0–100.0)
MONO ABS: 0.9 10*3/uL (ref 0.1–1.0)
Monocytes Relative: 5 % (ref 3–12)
NEUTROS PCT: 81 % — AB (ref 43–77)
Neutro Abs: 14.8 10*3/uL — ABNORMAL HIGH (ref 1.7–7.7)
PLATELETS: 275 10*3/uL (ref 150–400)
RBC: 4.32 MIL/uL (ref 4.22–5.81)
RDW: 16.7 % — AB (ref 11.5–15.5)
WBC Morphology: INCREASED
WBC: 18.3 10*3/uL — AB (ref 4.0–10.5)

## 2015-07-21 LAB — BASIC METABOLIC PANEL
Anion gap: 10 (ref 5–15)
Anion gap: 12 (ref 5–15)
BUN: 70 mg/dL — AB (ref 6–20)
BUN: 71 mg/dL — AB (ref 6–20)
CHLORIDE: 125 mmol/L — AB (ref 101–111)
CO2: 12 mmol/L — AB (ref 22–32)
CO2: 9 mmol/L — AB (ref 22–32)
CREATININE: 2.14 mg/dL — AB (ref 0.61–1.24)
CREATININE: 2.31 mg/dL — AB (ref 0.61–1.24)
Calcium: 7.8 mg/dL — ABNORMAL LOW (ref 8.9–10.3)
Calcium: 7.8 mg/dL — ABNORMAL LOW (ref 8.9–10.3)
Chloride: 124 mmol/L — ABNORMAL HIGH (ref 101–111)
GFR calc Af Amer: 35 mL/min — ABNORMAL LOW (ref >60)
GFR calc non Af Amer: 30 mL/min — ABNORMAL LOW (ref >60)
GFR calc non Af Amer: 27 mL/min — ABNORMAL LOW (ref >60)
GFR, EST AFRICAN AMERICAN: 31 mL/min — AB (ref >60)
Glucose, Bld: 106 mg/dL — ABNORMAL HIGH (ref 65–99)
Glucose, Bld: 164 mg/dL — ABNORMAL HIGH (ref 65–99)
POTASSIUM: 4.3 mmol/L (ref 3.5–5.1)
POTASSIUM: 4.7 mmol/L (ref 3.5–5.1)
SODIUM: 146 mmol/L — AB (ref 135–145)
Sodium: 146 mmol/L — ABNORMAL HIGH (ref 135–145)

## 2015-07-21 LAB — URINALYSIS, ROUTINE W REFLEX MICROSCOPIC
Glucose, UA: NEGATIVE mg/dL
Ketones, ur: NEGATIVE mg/dL
NITRITE: NEGATIVE
PROTEIN: 100 mg/dL — AB
Specific Gravity, Urine: 1.02 (ref 1.005–1.030)
UROBILINOGEN UA: 0.2 mg/dL (ref 0.0–1.0)
pH: 5 (ref 5.0–8.0)

## 2015-07-21 LAB — CBC
HCT: 36.8 % — ABNORMAL LOW (ref 39.0–52.0)
HEMOGLOBIN: 10.9 g/dL — AB (ref 13.0–17.0)
MCH: 27 pg (ref 26.0–34.0)
MCHC: 29.6 g/dL — AB (ref 30.0–36.0)
MCV: 91.3 fL (ref 78.0–100.0)
PLATELETS: 254 10*3/uL (ref 150–400)
RBC: 4.03 MIL/uL — AB (ref 4.22–5.81)
RDW: 16.8 % — ABNORMAL HIGH (ref 11.5–15.5)
WBC: 31.5 10*3/uL — ABNORMAL HIGH (ref 4.0–10.5)

## 2015-07-21 LAB — COMPREHENSIVE METABOLIC PANEL
ALBUMIN: 1.2 g/dL — AB (ref 3.5–5.0)
ALT: 22 U/L (ref 17–63)
ANION GAP: 11 (ref 5–15)
AST: 27 U/L (ref 15–41)
Alkaline Phosphatase: 136 U/L — ABNORMAL HIGH (ref 38–126)
BILIRUBIN TOTAL: 0.9 mg/dL (ref 0.3–1.2)
BUN: 73 mg/dL — ABNORMAL HIGH (ref 6–20)
CHLORIDE: 120 mmol/L — AB (ref 101–111)
CO2: 14 mmol/L — ABNORMAL LOW (ref 22–32)
Calcium: 8.2 mg/dL — ABNORMAL LOW (ref 8.9–10.3)
Creatinine, Ser: 2.23 mg/dL — ABNORMAL HIGH (ref 0.61–1.24)
GFR calc Af Amer: 33 mL/min — ABNORMAL LOW (ref >60)
GFR, EST NON AFRICAN AMERICAN: 28 mL/min — AB (ref >60)
GLUCOSE: 156 mg/dL — AB (ref 65–99)
POTASSIUM: 4.3 mmol/L (ref 3.5–5.1)
Sodium: 145 mmol/L (ref 135–145)
TOTAL PROTEIN: 5.3 g/dL — AB (ref 6.5–8.1)

## 2015-07-21 LAB — POCT I-STAT 3, ART BLOOD GAS (G3+)
Acid-base deficit: 15 mmol/L — ABNORMAL HIGH (ref 0.0–2.0)
BICARBONATE: 10.8 meq/L — AB (ref 20.0–24.0)
O2 Saturation: 96 %
PCO2 ART: 23.9 mmHg — AB (ref 35.0–45.0)
TCO2: 11 mmol/L (ref 0–100)
pH, Arterial: 7.259 — ABNORMAL LOW (ref 7.350–7.450)
pO2, Arterial: 89 mmHg (ref 80.0–100.0)

## 2015-07-21 LAB — GLUCOSE, CAPILLARY
GLUCOSE-CAPILLARY: 151 mg/dL — AB (ref 65–99)
GLUCOSE-CAPILLARY: 133 mg/dL — AB (ref 65–99)
Glucose-Capillary: 101 mg/dL — ABNORMAL HIGH (ref 65–99)
Glucose-Capillary: 115 mg/dL — ABNORMAL HIGH (ref 65–99)
Glucose-Capillary: 181 mg/dL — ABNORMAL HIGH (ref 65–99)

## 2015-07-21 LAB — LACTIC ACID, PLASMA: LACTIC ACID, VENOUS: 4.3 mmol/L — AB (ref 0.5–2.0)

## 2015-07-21 LAB — PHOSPHORUS: PHOSPHORUS: 4.9 mg/dL — AB (ref 2.5–4.6)

## 2015-07-21 LAB — APTT: APTT: 29 s (ref 24–37)

## 2015-07-21 LAB — PROTIME-INR
INR: 1.65 — ABNORMAL HIGH (ref 0.00–1.49)
INR: 1.95 — AB (ref 0.00–1.49)
PROTHROMBIN TIME: 22.2 s — AB (ref 11.6–15.2)
Prothrombin Time: 19.5 seconds — ABNORMAL HIGH (ref 11.6–15.2)

## 2015-07-21 LAB — PROCALCITONIN: Procalcitonin: 4.4 ng/mL

## 2015-07-21 LAB — TROPONIN I: Troponin I: 0.06 ng/mL — ABNORMAL HIGH (ref <0.031)

## 2015-07-21 LAB — MRSA PCR SCREENING: MRSA by PCR: NEGATIVE

## 2015-07-21 LAB — CORTISOL
CORTISOL PLASMA: 25.5 ug/dL
CORTISOL PLASMA: 59.8 ug/dL

## 2015-07-21 LAB — TSH: TSH: 3.071 u[IU]/mL (ref 0.350–4.500)

## 2015-07-21 LAB — FIBRINOGEN: FIBRINOGEN: 477 mg/dL — AB (ref 204–475)

## 2015-07-21 LAB — MAGNESIUM: MAGNESIUM: 2.1 mg/dL (ref 1.7–2.4)

## 2015-07-21 MED ORDER — FENTANYL BOLUS VIA INFUSION
25.0000 ug | INTRAVENOUS | Status: DC | PRN
  Filled 2015-07-21: qty 25

## 2015-07-21 MED ORDER — NOREPINEPHRINE BITARTRATE 1 MG/ML IV SOLN
2.0000 ug/min | INTRAVENOUS | Status: DC
  Administered 2015-07-21: 30 ug/min via INTRAVENOUS
  Filled 2015-07-21 (×2): qty 4

## 2015-07-21 MED ORDER — VASOPRESSIN 20 UNIT/ML IV SOLN
0.0300 [IU]/min | INTRAVENOUS | Status: DC
  Administered 2015-07-21 – 2015-07-22 (×2): 0.03 [IU]/min via INTRAVENOUS
  Filled 2015-07-21 (×3): qty 2

## 2015-07-21 MED ORDER — ALTEPLASE 2 MG IJ SOLR
2.0000 mg | Freq: Once | INTRAMUSCULAR | Status: AC
  Administered 2015-07-21: 2 mg
  Filled 2015-07-21: qty 2

## 2015-07-21 MED ORDER — VANCOMYCIN HCL 10 G IV SOLR
1250.0000 mg | INTRAVENOUS | Status: DC
  Administered 2015-07-21 – 2015-07-22 (×2): 1250 mg via INTRAVENOUS
  Filled 2015-07-21 (×3): qty 1250

## 2015-07-21 MED ORDER — FENTANYL CITRATE (PF) 2500 MCG/50ML IJ SOLN
25.0000 ug/h | INTRAMUSCULAR | Status: DC
  Administered 2015-07-21: 100 ug/h via INTRAVENOUS
  Administered 2015-07-21: 50 ug/h via INTRAVENOUS
  Filled 2015-07-21 (×3): qty 50

## 2015-07-21 MED ORDER — NOREPINEPHRINE BITARTRATE 1 MG/ML IV SOLN
2.0000 ug/min | INTRAVENOUS | Status: DC
  Administered 2015-07-21: 50 ug/min via INTRAVENOUS
  Administered 2015-07-21: 40 ug/min via INTRAVENOUS
  Administered 2015-07-21: 50 ug/min via INTRAVENOUS
  Administered 2015-07-22: 25 ug/min via INTRAVENOUS
  Administered 2015-07-25: 4 ug/min via INTRAVENOUS
  Filled 2015-07-21 (×8): qty 16

## 2015-07-21 MED ORDER — SODIUM CHLORIDE 0.9 % IV SOLN
250.0000 mg | Freq: Two times a day (BID) | INTRAVENOUS | Status: DC
  Filled 2015-07-21 (×2): qty 250

## 2015-07-21 MED ORDER — SODIUM CHLORIDE 0.9 % IV SOLN
250.0000 mg | Freq: Four times a day (QID) | INTRAVENOUS | Status: DC
  Administered 2015-07-21 – 2015-07-24 (×11): 250 mg via INTRAVENOUS
  Filled 2015-07-21 (×13): qty 250

## 2015-07-21 MED ORDER — PANTOPRAZOLE SODIUM 40 MG IV SOLR
40.0000 mg | Freq: Every day | INTRAVENOUS | Status: DC
  Administered 2015-07-21 – 2015-07-23 (×4): 40 mg via INTRAVENOUS
  Filled 2015-07-21 (×5): qty 40

## 2015-07-21 MED ORDER — MIDAZOLAM BOLUS VIA INFUSION
2.0000 mg | INTRAVENOUS | Status: DC | PRN
  Filled 2015-07-21: qty 2

## 2015-07-21 MED ORDER — FENTANYL CITRATE (PF) 100 MCG/2ML IJ SOLN
50.0000 ug | Freq: Once | INTRAMUSCULAR | Status: AC
  Administered 2015-07-21: 50 ug via INTRAVENOUS

## 2015-07-21 MED ORDER — ANTISEPTIC ORAL RINSE SOLUTION (CORINZ)
7.0000 mL | Freq: Four times a day (QID) | OROMUCOSAL | Status: DC
  Administered 2015-07-21 – 2015-07-25 (×18): 7 mL via OROMUCOSAL

## 2015-07-21 MED ORDER — MIDAZOLAM HCL 2 MG/2ML IJ SOLN
2.0000 mg | Freq: Once | INTRAMUSCULAR | Status: AC
  Administered 2015-07-21: 2 mg via INTRAVENOUS

## 2015-07-21 MED ORDER — MIDAZOLAM HCL 2 MG/2ML IJ SOLN: 2.0000 mg | Freq: Once | INTRAMUSCULAR | Status: DC

## 2015-07-21 MED ORDER — CISATRACURIUM BOLUS VIA INFUSION
0.1000 mg/kg | Freq: Once | INTRAVENOUS | Status: AC
Start: 2015-07-21 — End: 2015-07-21
  Administered 2015-07-21: 11.5 mg via INTRAVENOUS
  Filled 2015-07-21: qty 12

## 2015-07-21 MED ORDER — CHLORHEXIDINE GLUCONATE 0.12% ORAL RINSE (MEDLINE KIT)
15.0000 mL | Freq: Two times a day (BID) | OROMUCOSAL | Status: DC
  Administered 2015-07-21 – 2015-07-25 (×9): 15 mL via OROMUCOSAL

## 2015-07-21 MED ORDER — FENTANYL CITRATE (PF) 100 MCG/2ML IJ SOLN: 100.0000 ug | Freq: Once | INTRAMUSCULAR | Status: DC | PRN

## 2015-07-21 MED ORDER — SODIUM CHLORIDE 0.9 % IV BOLUS (SEPSIS)
1000.0000 mL | Freq: Once | INTRAVENOUS | Status: AC
  Administered 2015-07-21: 1000 mL via INTRAVENOUS

## 2015-07-21 MED ORDER — MIDAZOLAM HCL 2 MG/2ML IJ SOLN
INTRAMUSCULAR | Status: AC
  Administered 2015-07-21: 2 mg via INTRAVENOUS
  Filled 2015-07-21: qty 2

## 2015-07-21 MED ORDER — MIDAZOLAM HCL 5 MG/ML IJ SOLN
1.0000 mg/h | INTRAMUSCULAR | Status: DC
  Administered 2015-07-21 – 2015-07-22 (×2): 2 mg/h via INTRAVENOUS
  Filled 2015-07-21 (×2): qty 10

## 2015-07-21 MED ORDER — FENTANYL BOLUS VIA INFUSION
50.0000 ug | INTRAVENOUS | Status: DC | PRN
  Filled 2015-07-21: qty 50

## 2015-07-21 MED ORDER — ARTIFICIAL TEARS OP OINT
1.0000 "application " | TOPICAL_OINTMENT | Freq: Three times a day (TID) | OPHTHALMIC | Status: DC
  Administered 2015-07-21 – 2015-07-22 (×3): 1 via OPHTHALMIC
  Filled 2015-07-21: qty 3.5

## 2015-07-21 MED ORDER — WHITE PETROLATUM GEL
Freq: Every day | Status: DC
  Administered 2015-07-21 – 2015-07-25 (×5): 0.2 via TOPICAL
  Filled 2015-07-21 (×2): qty 28.35
  Filled 2015-07-21: qty 1
  Filled 2015-07-21 (×2): qty 28.35

## 2015-07-21 MED ORDER — SODIUM CHLORIDE 0.9 % IV BOLUS (SEPSIS)
500.0000 mL | Freq: Once | INTRAVENOUS | Status: AC
  Administered 2015-07-21: 500 mL via INTRAVENOUS

## 2015-07-21 MED ORDER — MIDAZOLAM HCL 2 MG/2ML IJ SOLN: 1.0000 mg | INTRAMUSCULAR | Status: DC | PRN

## 2015-07-21 MED ORDER — FENTANYL CITRATE (PF) 100 MCG/2ML IJ SOLN: 100.0000 ug | Freq: Once | INTRAMUSCULAR | Status: DC

## 2015-07-21 MED ORDER — SODIUM CHLORIDE 0.9 % IV SOLN
100.0000 mg | INTRAVENOUS | Status: DC
  Administered 2015-07-22 – 2015-07-25 (×4): 100 mg via INTRAVENOUS
  Filled 2015-07-21 (×4): qty 100

## 2015-07-21 MED ORDER — SODIUM CHLORIDE 0.9 % IV SOLN
500.0000 mg | Freq: Once | INTRAVENOUS | Status: AC
  Administered 2015-07-21: 500 mg via INTRAVENOUS
  Filled 2015-07-21: qty 500

## 2015-07-21 MED ORDER — HYDROCORTISONE NA SUCCINATE PF 100 MG IJ SOLR
50.0000 mg | Freq: Four times a day (QID) | INTRAMUSCULAR | Status: DC
  Administered 2015-07-21 – 2015-07-22 (×5): 50 mg via INTRAVENOUS
  Filled 2015-07-21: qty 2
  Filled 2015-07-21 (×3): qty 1
  Filled 2015-07-21 (×2): qty 2
  Filled 2015-07-21: qty 1
  Filled 2015-07-21: qty 2

## 2015-07-21 MED ORDER — HEPARIN SODIUM (PORCINE) 5000 UNIT/ML IJ SOLN
5000.0000 [IU] | Freq: Three times a day (TID) | INTRAMUSCULAR | Status: DC
  Filled 2015-07-21 (×3): qty 1

## 2015-07-21 MED ORDER — SODIUM CHLORIDE 0.9 % IV SOLN
25.0000 ug/h | INTRAVENOUS | Status: DC
  Administered 2015-07-22: 100 ug/h via INTRAVENOUS
  Filled 2015-07-21 (×2): qty 50

## 2015-07-21 MED ORDER — SODIUM CHLORIDE 0.9 % IV SOLN
200.0000 mg | Freq: Once | INTRAVENOUS | Status: AC
  Administered 2015-07-21: 200 mg via INTRAVENOUS
  Filled 2015-07-21: qty 200

## 2015-07-21 MED ORDER — INSULIN ASPART 100 UNIT/ML ~~LOC~~ SOLN
0.0000 [IU] | SUBCUTANEOUS | Status: DC
  Administered 2015-07-21: 2 [IU] via SUBCUTANEOUS
  Administered 2015-07-21 – 2015-07-23 (×9): 3 [IU] via SUBCUTANEOUS
  Administered 2015-07-23 – 2015-07-25 (×14): 2 [IU] via SUBCUTANEOUS

## 2015-07-21 MED ORDER — EPINEPHRINE HCL 1 MG/ML IJ SOLN
0.5000 ug/min | INTRAVENOUS | Status: DC
  Administered 2015-07-21: 10 ug/min via INTRAVENOUS
  Administered 2015-07-21: 2 ug/min via INTRAVENOUS
  Filled 2015-07-21 (×4): qty 4

## 2015-07-21 MED ORDER — MIDAZOLAM HCL 2 MG/2ML IJ SOLN: 2.0000 mg | Freq: Once | INTRAMUSCULAR | Status: DC | PRN

## 2015-07-21 MED ORDER — SODIUM CHLORIDE 0.9 % IV SOLN
INTRAVENOUS | Status: DC
  Administered 2015-07-21: 02:00:00 via INTRAVENOUS

## 2015-07-21 MED ORDER — SODIUM CHLORIDE 0.9 % IV SOLN
3.0000 ug/kg/min | INTRAVENOUS | Status: DC
  Administered 2015-07-21 – 2015-07-22 (×2): 3 ug/kg/min via INTRAVENOUS
  Filled 2015-07-21 (×2): qty 20

## 2015-07-21 MED ORDER — VANCOMYCIN HCL IN DEXTROSE 1-5 GM/200ML-% IV SOLN
1000.0000 mg | Freq: Once | INTRAVENOUS | Status: AC
  Administered 2015-07-21: 1000 mg via INTRAVENOUS
  Filled 2015-07-21: qty 200

## 2015-07-21 MED ORDER — SODIUM CHLORIDE 0.9 % IV SOLN
250.0000 mL | INTRAVENOUS | Status: DC | PRN
  Administered 2015-07-22 – 2015-07-23 (×2): 250 mL via INTRAVENOUS

## 2015-07-21 NOTE — Consult Note (Addendum)
WOC wound consult note Reason for Consult: Multiple medical issues including three pressure ulcers POA; (nare, two buttock/sacrum).  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 Trach site, still patent with drainage Healing sDTI-that has evolved to Stage III-left nare Pressure Ulcer POA: Yes x 3 Midline surgical wound  Measurement: Left buttock 0.4cm x 1cm x 0.2cm  Right buttock 1.5cm x 2cm x 0.2cm  Nare: 0.3cm x 0.1cm x 0.1cm  Did not measure abdominal wound due to VAC in place Wound bed: see doc flow sheet  Drainage (amount, consistency, odor) scant from buttocks; none at nare; trach site yellow/thick  Periwound: intact at all sites, however sacrum is reddened but blanches Dressing procedure/placement/frequency: Silicone foam to the trach site and the sacrum, change every 3 days Vaseline to the left nare Sport bed in place while in ICU for pressure relief, will need LALM if transfers out of ICU   WOC ostomy consult note Stoma type/location: RLQ, end ileosotmy Stomal assessment/size: 1 1/2" round-not able to assess due to multiple medical procedures being performed at the time of my visit (Aline, central line placement) Peristomal assessment: not assessed today Treatment options for stomal/peristomal skin: NA Output liquid, brown Ostomy pouching: 2pc. In place from [facility] Education provided: pt critically ill, on vent.  No family at bedside  Enrolled patient in [employer] DC program: No  Note: While turning this patient with nursing student, large gush of reddish/brown, foul smelling watery fluid from the patients rectum.  Pt has ileostomy but does have some abdominal abscesses noted on CT scan and is planned for IR procedure later today to attempt to drain.  CCM at bedside reported CT scan noted possible fistula as well.  Output from the rectum does appear to be like abscess fluid not  stool.    WOC team will follow along with you for support with wound care and ostomy care.  Please notify me of any acute changes in the wounds or any new areas of concerns Armen Pickup RN,CWOCN [phone]

## 2015-07-21 NOTE — Sedation Documentation (Signed)
Pt for abscess drain

## 2015-07-21 NOTE — Progress Notes (Signed)
CRITICAL VALUE ALERT  Critical value received:  Lactic Acid 4.3  Date of notification:  07/21/15  Time of notification:  0447  Critical value read back:yes  Nurse who received alert:  Julius Bowels, RN  MD notified (1st page):  Dr. Darrick Penna  Time of first page:  [phone]

## 2015-07-21 NOTE — Progress Notes (Signed)
CRITICAL VALUE ALERT  Critical value received:  Ct scan resulted, see scan info  Date of notification:  07/21/15  Time of notification:  0350  Critical value read back: yes  Nurse who received alert:  km  Responding MD:  elink  Time MD responded:  [phone]

## 2015-07-21 NOTE — Progress Notes (Signed)
I have had extensive discussions with family wife. We discussed patients current circumstances and organ failures. We also discussed patient's prior wishes under circumstances such as this. Family has decided to NOT perform resuscitation if arrest but to continue current medical support for now.  Jebadiah Imperato J. Maxie Debose, MD, FACP Pgr: [phone] [facility]  

## 2015-07-21 NOTE — Progress Notes (Signed)
Pt ordered q4hrs CVP's. Pt has picc line, no blood returned noted. Contacted IV nurse, cath flo administered.

## 2015-07-21 NOTE — Sedation Documentation (Addendum)
Pt is vented , on IV Fentanyl, Versed and Levophed. On IV Epi as well

## 2015-07-21 NOTE — Progress Notes (Signed)
RT called Per order to place arterial line. RT attempted to place twice with flow back but was unsuccessful in threading the catheter due to sclerotic tissue that bent the catheter. RN aware and notified MD.

## 2015-07-21 NOTE — Progress Notes (Signed)
Subjective: Pt recently underwent TAC for ischemic colitis 8/4.  He was eventually DC to LTAC.  Pt recently became septic and required intubation.  CT was ordered 2/2 sepsis which revealed bil intraabd fluid collections.  Objective: Vital signs in last 24 hours: Temp:  [97.8 F (36.6 C)] 97.8 F (36.6 C) (09/06 0340) Pulse Rate:  [80-104] 84 (09/06 0530) Resp:  [23-36] 28 (09/06 0530) BP: (66-116)/(33-64) 87/55 mmHg (09/06 0530) SpO2:  [96 %-100 %] 96 % (09/06 0530) FiO2 (%):  [40 %] 40 % (09/06 0400) Weight:  [115.1 kg (253 lb 12 oz)] 115.1 kg (253 lb 12 oz) (09/06 0130) Last BM Date: 07/21/15  Intake/Output from previous day: 09/05 0701 - 09/06 0700 In: 1663.8 [I.V.:663.8; IV Piggyback:1000] Out: 200 [Urine:200] Intake/Output this shift: Total I/O In: 1663.8 [I.V.:663.8; IV Piggyback:1000] Out: 200 [Urine:200]  General appearance: toxic Resp: clear to auscultation bilaterally Cardio: regular rate and rhythm, S1, S2 normal, no murmur, click, rub or gallop GI: soft, non-tender; bowel sounds normal; no masses,  no organomegaly and ostomy pink/patent, midline wound with vac in place  Lab Results:   Recent Labs  07/20/15 0550 07/21/15 0405  WBC 18.5* 18.3*  HGB 10.4* 11.5*  HCT 34.0* 38.2*  PLT 323 275   BMET  Recent Labs  07/20/15 0550 07/21/15 0405  NA 149* 145  K 5.2* 4.3  CL 124* 120*  CO2 13* 14*  GLUCOSE 129* 156*  BUN 71* 73*  CREATININE 2.90* 2.23*  CALCIUM 8.4* 8.2*   PT/INR No results for input(s): LABPROT, INR in the last 72 hours. ABG  Recent Labs  07/19/15 1130 07/21/15 0334  PHART 7.446 7.308*  HCO3 18.5* 11.6*    Studies/Results: Ct Abdomen Pelvis Wo Contrast  07/21/2015   CLINICAL DATA:  69 year old male with history of prior colectomy for ischemic colitis presenting with septic shock and acute respiratory failure.  EXAM: CT ABDOMEN AND PELVIS WITHOUT CONTRAST  TECHNIQUE: Multidetector CT imaging of the abdomen and pelvis was  performed following the standard protocol without IV contrast.  COMPARISON:  CT dated 06/26/2015  FINDINGS: Evaluation of this exam is limited in the absence of intravenous contrast. Evaluation is also limited due to streak artifact caused by patient's arms.  Partially visualized small bilateral pleural effusions with subsegmental consolidative changes of the lung bases compatible with atelectasis versus pneumonia. There is coronary vascular calcification and CABG clips.  No intra-abdominal free air.  No free fluid identified.  The liver is grossly unremarkable. Layering high attenuating material within the gallbladder may represent sludge or small stones versus vicariously excreted contrast material from prior study. There is no pericholecystic fluid. The pancreas, spleen, adrenal glands appear unremarkable. There are small nonobstructing bilateral renal calculi measuring up to 5 mm. There is no hydronephrosis on either side. The visualized ureters appear unremarkable. The urinary bladder is decompressed around a Foley catheter. A 4 mm calcific density along the right bladder wall is similar to the prior study. Enlarged prostate gland measuring up to 6.5 cm in diameter.  Evaluation of the bowel is very limited in the absence of oral contrast. 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 There is fluid distention of the rectal pouch which has increased in size and more distended compared to the prior study. There is haziness of the fat surrounding the upper border of the rectal pouch with small pockets of air concerning for sutural dehiscence. Multiple decompressed loops of small bowel noted within the abdomen and pelvis. A 15  x 12 cm loculated fluid collection noted in the left hemipelvis containing air and fluid. There has been interval increase in the size of this collection with significant increase in the amount of gas within this collection concerning for developing infection. An ill-defined  apparent track like structure may be present extending from this collection to the rectal pouch concerning for developing fistula. There is extension of the gas into the left anterior abdominal musculature adjacent and superior to the loculated peritoneal collection. A 17 x 9 cm loculated fluid collection is noted in the right anterior peritoneum superior to the ileostomy.  Mild aortoiliac atherosclerotic disease. There is no lymphadenopathy. There is a small fat containing left inguinal hernia. Midline vertical anterior abdominal wall incisional scar. Degenerative changes of the spine. No acute fracture.  IMPRESSION: Postsurgical changes of colectomy and right lower quadrant ileostomy. No evidence of bowel obstruction. There has been interval increase in the size of fluid field rectal pouch. Small pockets of air superior to the pouch are concerning for suture dehiscence. Clinical correlation and surgical consult is advised.  Increase in the size of the left hemipelvic peritoneal collection with significant interval increase in the size of the gas within this collection concerning for superimposed infection. An ill-defined track like structure extending from this collection to the rectal pouch is concerning for developing fistula. There is extension of gas in the left anterior abdominal musculature superior to this collection.  Loculated peritoneal fluid collection in the right anterior hemiabdomen superior to the ileostomy.  Small bilateral pleural effusions with bibasilar subsegmental atelectasis/pneumonia.  These results were called by telephone at the time of interpretation on 07/21/2015 at 3:52 am to the charge nurse, Christell Constant, who verbally acknowledged these results.   Electronically Signed   By: Elgie Collard M.D.   On: 07/21/2015 03:55   US Renal Port  07/20/2015   CLINICAL DATA:  Renal failure. Patient on vent. CT congestive heart failure.  EXAM: RENAL / URINARY TRACT ULTRASOUND COMPLETE  COMPARISON:  CT  812 1,016  FINDINGS: Right Kidney:  Length: 11.7 cm.  Normal cortical thickness.  No hydronephrosis.  Left Kidney:  Length: 10.8 cm.  Normal cortical thickness.  No hydronephrosis.  Bladder:  Appears normal for degree of bladder distention.  There several fluid collections within the peritoneal space. These collections have lacy internal echoes consistent with a complex fluid collections. The largest in LEFT upper quadrant and aright which enters 19 x 10 x 17 cm.  IMPRESSION: 1. No hydronephrosis. 2. Multiple complex fluid collections within the peritoneal space. There was fluid within the peritoneal space on comparison CT however the collection appear larger and have internal complexity suggesting peritonitis or abscess. Recommend CT of the abdomen and pelvis with contrast (if possible) for further evaluation. Findings conveyed tocharge nurse Brie on 07/20/2015 at21:44. Attempting to contact attending physician.   Electronically Signed   By: Genevive Bi M.D.   On: 07/20/2015 21:49   Dg Chest Port 1 View  07/20/2015   CLINICAL DATA:  Endotracheal tube and NG tube placement  EXAM: PORTABLE CHEST - 1 VIEW  COMPARISON:  07/19/2015  FINDINGS: There is endotracheal tube with the tip at the right mainstem bronchus; recommend retracting the endotracheal tube 2.5 cm.  There is mild bilateral interstitial prominence. There is left basilar linear airspace disease likely reflecting atelectasis. There is no pleural effusion or pneumothorax. There is stable mild cardiomegaly. There is evidence of prior CABG.  The osseous structures are unremarkable.  IMPRESSION: 1. Endotracheal  tube with the tip at the right mainstem bronchus; recommend retracting the endotracheal tube 2.5 cm.   Electronically Signed   By: Elige Ko   On: 07/20/2015 18:57   Dg Chest Port 1 View  07/19/2015   CLINICAL DATA:  Rule out pneumonia.  EXAM: PORTABLE CHEST - 1 VIEW  COMPARISON:  07/07/2015  FINDINGS: There is bibasilar hazy airspace disease.  There is no pleural effusion or pneumothorax. The heart mediastinum are stable. There is evidence of prior CABG.  The osseous structures are unremarkable.  IMPRESSION: Bibasilar hazy airspace disease which may reflect atelectasis versus pneumonia.   Electronically Signed   By: Elige Ko   On: 07/19/2015 14:36   Dg Abd Portable 1v  07/20/2015   CLINICAL DATA:  Nasogastric tube placement.  EXAM: PORTABLE ABDOMEN - 1 VIEW  COMPARISON:  07/03/2015  FINDINGS: Nasogastric tube is not visible despite the history. Endotracheal tube is in place, tip approximately 2 cm above carina. There are coarse markings in the lungs.  IMPRESSION: No enteric tube identified.   Electronically Signed   By: Norva Pavlov M.D.   On: 07/20/2015 18:15    Anti-infectives: Anti-infectives    Start     Dose/Rate Route Frequency Ordered Stop   07/21/15 1200  imipenem-cilastatin (PRIMAXIN) 250 mg in sodium chloride 0.9 % 100 mL IVPB     250 mg 200 mL/hr over 30 Minutes Intravenous Every 12 hours 07/21/15 0200     07/21/15 0230  vancomycin (VANCOCIN) IVPB 1000 mg/200 mL premix     1,000 mg 200 mL/hr over 60 Minutes Intravenous  Once 07/21/15 0200 07/21/15 0334   07/21/15 0230  imipenem-cilastatin (PRIMAXIN) 500 mg in sodium chloride 0.9 % 100 mL IVPB     500 mg 200 mL/hr over 30 Minutes Intravenous  Once 07/21/15 0200 07/21/15 0303      Assessment/Plan: S/p TAC 8/4 no with intraabominal fluid collections 1. Would recommend  IR drainage of abdominal fluid collection Will follow along  LOS: 0 days    Marigene Ehlers., [facility] 07/21/2015

## 2015-07-21 NOTE — Procedures (Signed)
Arterial Catheter Insertion Procedure Note Jonathan Le [id number] [birth year]  Procedure: Insertion of Arterial Catheter  Indications: Blood pressure monitoring and Frequent blood sampling  Procedure Details Consent: Risks of procedure as well as the alternatives and risks of each were explained to the (patient/caregiver).  Consent for procedure obtained. Time Out: Verified patient identification, verified procedure, site/side was marked, verified correct patient position, special equipment/implants available, medications/allergies/relevent history reviewed, required imaging and test results available.  Performed  Maximum sterile technique was used including antiseptics, cap, gloves, gown, hand hygiene, mask and sheet. Skin prep: Chlorhexidine; local anesthetic administered 20 gauge catheter was inserted into right femoral artery using the Seldinger technique.  Evaluation Blood flow good; BP tracing good. Complications: No apparent complications.   [name]. 07/21/2015  Refractory shock No radials  Mcarthur Rossetti. Tyson Alias, MD, FACP Pgr: [phone] [place] [facility]

## 2015-07-21 NOTE — Progress Notes (Signed)
eLink Physician-Brief Progress Note Patient Name: Jonathan Le DOB: Apr 13, 1946 MRN: [id number]   Date of Service  07/21/2015  HPI/Events of Note  Concern for sepsis with elevated lactate, WBC although no ever.  Is on pressors for BP support.  eICU Interventions  Plan: 1 liter of NS for clearance of lactate and BP support.  After 1 liter will plan on 2nd liter.     Intervention Category Major Interventions: Sepsis - evaluation and management  DETERDING,ELIZABETH 07/21/2015, 5:01 AM

## 2015-07-21 NOTE — Sedation Documentation (Signed)
Two successful abscess drain in place, dressings clean. Dry, and intact

## 2015-07-21 NOTE — Consult Note (Signed)
Chief Complaint: Patient was seen in consultation today for intra abdominal abscesses at the request of Dr Derrell Lolling  Referring Physician(s): Dr Derrell Lolling  History of Present Illness: Jonathan Le is a 69 y.o. male   CABG 05/2015 Complicated course- discharged to home Found down at home and re admitted Found to be hypotensive and with ischemic bowel Colectomy 06/18/2015 Now with intra abdominal abscess(es) IMPRESSION: Postsurgical changes of colectomy and right lower quadrant ileostomy. No evidence of bowel obstruction. There has been interval increase in the size of fluid field rectal pouch. Small pockets of air superior to the pouch are concerning for suture dehiscence. Clinical correlation and surgical consult is advised.  Increase in the size of the left hemipelvic peritoneal collection with significant interval increase in the size of the gas within this collection concerning for superimposed infection. An ill-defined track like structure extending from this collection to the rectal pouch is concerning for developing fistula. There is extension of gas in the left anterior abdominal musculature superior to this collection.  Loculated peritoneal fluid collection in the right anterior hemiabdomen superior to the ileostomy.  Small bilateral pleural effusions with bibasilar subsegmental Atelectasis/pneumonia.  Request for abscess drains to be placed in IR Dr Bonnielee Haff has seen imaging and approves procedure I have seen and examined pt   Past Medical History  Diagnosis Date  . Anxiety   . Hypertension   . Arthritis   . Sleep apnea     wears CPAP nightly  . Glaucoma   . CHF (congestive heart failure)     MI, with open heart surgery x 30 sdays ago per pt. (06/26/15)    Past Surgical History  Procedure Laterality Date  . Hernia repair      right  . Eye surgery      bil cataract  . Wisdom tooth extraction    . Fasciotomy foot / toe Left     foot  . Cardiac  catheterization N/A 05/20/2015    Procedure: Left Heart Cath and Coronary Angiography;  Surgeon: Kathleene Hazel, MD;  Location: [facility] INVASIVE CV LAB;  Service: Cardiovascular;  Laterality: N/A;  . Coronary artery bypass graft N/A 05/22/2015    Procedure: CORONARY ARTERY BYPASS GRAFTING (CABG), ON PUMP, TIMES THREE, USING LEFT INTERNAL MAMMARY ARTERY, RIGHT GREATER SAPHENOUS VEIN HARVESTED ENDOSCOPICALLY;  Surgeon: Kerin Perna, MD;  Location: [facility] OR;  Service: Open Heart Surgery;  Laterality: N/A;  LIMA-LAD, SVG-OM, SVG-PD  . Tee without cardioversion N/A 05/22/2015    Procedure: TRANSESOPHAGEAL ECHOCARDIOGRAM (TEE);  Surgeon: Kerin Perna, MD;  Location: [facility] OR;  Service: Open Heart Surgery;  Laterality: N/A;  . Laparotomy N/A 06/18/2015    Procedure: EXPLORATORY LAPAROTOMY;  Surgeon: Emelia Loron, MD;  Location: [facility] OR;  Service: General;  Laterality: N/A;  . Laparotomy N/A 06/20/2015    Procedure: WASHOUT OF ABDOMEN, CLOSURE OF ABDOMINAL WALL WITH PLACEMENT OF WOUND VAC AND ILLEOSTOMY ;  Surgeon: Almond Lint, MD;  Location: [facility] OR;  Service: General;  Laterality: N/A;  . Tracheostomy tube placement N/A 07/03/2015    Procedure: TRACHEOSTOMY;  Surgeon: Serena Colonel, MD;  Location: [facility] OR;  Service: ENT;  Laterality: N/A;    Allergies: Levaquin; Penicillins; and Doxycycline  Medications: Prior to Admission medications   Medication Sig Start Date End Date Taking? Authorizing Provider  Amino Acids-Protein Hydrolys (FEEDING SUPPLEMENT, PRO-STAT SUGAR FREE 64,) LIQD Place 30 mLs into feeding tube 3 (three) times daily. 07/08/15   Jeanella Craze, NP  aspirin  EC 325 MG EC tablet Take 1 tablet (325 mg total) by mouth daily. 05/31/15   Erin R Barrett, PA-C  atorvastatin (LIPITOR) 80 MG tablet Take 1 tablet (80 mg total) by mouth daily at 6 PM. 05/31/15   Erin R Barrett, PA-C  chlorhexidine gluconate (PERIDEX) 0.12 % solution 15 mLs by Mouth Rinse route 2 (two) times daily. 07/08/15   Jeanella Craze, NP    collagenase (SANTYL) ointment Apply topically daily. Apply to R foot wound daily 07/08/15   Jeanella Craze, NP  enoxaparin (LOVENOX) 30 MG/0.3ML injection Inject 0.3 mLs (30 mg total) into the skin daily. 07/08/15   Jeanella Craze, NP  finasteride (PROSCAR) 5 MG tablet Take 5 mg by mouth daily.    Historical Provider, MD  insulin aspart (NOVOLOG) 100 UNIT/ML injection Inject 2-6 Units into the skin 3 (three) times daily with meals. 07/08/15   Jeanella Craze, NP  Maltodextrin-Xanthan Gum (RESOURCE THICKENUP CLEAR) POWD As needed for honey thick liquid consistency 07/08/15   Jeanella Craze, NP  Multiple Vitamin (MULTIVITAMIN WITH MINERALS) TABS tablet Take 1 tablet by mouth daily. 07/08/15   Jeanella Craze, NP  pantoprazole sodium (PROTONIX) 40 mg/20 mL PACK Place 20 mLs (40 mg total) into feeding tube at bedtime. 07/08/15   Jeanella Craze, NP  sodium chloride 0.9 % infusion Inject 10 mLs into the vein continuous. 07/08/15   Jeanella Craze, NP  white petrolatum (VASELINE) GEL Apply 1 application topically daily. Apply to L nare wound daily 07/08/15   Jeanella Craze, NP     No family history on file.  Social History   Social History  . Marital Status: Married    Spouse Name: N/A  . Number of Children: N/A  . Years of Education: N/A   Social History Main Topics  . Smoking status: Never Smoker   . Smokeless tobacco: Never Used  . Alcohol Use: No  . Drug Use: No  . Sexual Activity: Not Currently   Other Topics Concern  . None   Social History Narrative    Review of Systems: A 12 point ROS discussed and pertinent positives are indicated in the HPI above.  All other systems are negative.  Review of Systems  Constitutional: Negative for fever.    Vital Signs: BP 111/50 mmHg  Pulse 85  Temp(Src) 98 F (36.7 C) (Oral)  Resp 29  Wt 253 lb 12 oz (115.1 kg)  SpO2 98%  Physical Exam  Cardiovascular: Normal rate and regular rhythm.   Pulmonary/Chest:  vent  Abdominal: He exhibits  distension.  Musculoskeletal:  Vent; sedated  Psychiatric:  No response Consented wife via phone  Nursing note and vitals reviewed.   Mallampati Score:  MD Evaluation Airway comments: vent Heart: WNL Abdomen: WNL Chest/ Lungs: WNL ASA  Classification: 3 Mallampati/Airway Score: Three  Imaging: Ct Abdomen Pelvis Wo Contrast  07/21/2015   CLINICAL DATA:  69 year old male with history of prior colectomy for ischemic colitis presenting with septic shock and acute respiratory failure.  EXAM: CT ABDOMEN AND PELVIS WITHOUT CONTRAST  TECHNIQUE: Multidetector CT imaging of the abdomen and pelvis was performed following the standard protocol without IV contrast.  COMPARISON:  CT dated 06/26/2015  FINDINGS: Evaluation of this exam is limited in the absence of intravenous contrast. Evaluation is also limited due to streak artifact caused by patient's arms.  Partially visualized small bilateral pleural effusions with subsegmental consolidative changes of the lung bases compatible with atelectasis versus pneumonia.  There is coronary vascular calcification and CABG clips.  No intra-abdominal free air.  No free fluid identified.  The liver is grossly unremarkable. Layering high attenuating material within the gallbladder may represent sludge or small stones versus vicariously excreted contrast material from prior study. There is no pericholecystic fluid. The pancreas, spleen, adrenal glands appear unremarkable. There are small nonobstructing bilateral renal calculi measuring up to 5 mm. There is no hydronephrosis on either side. The visualized ureters appear unremarkable. The urinary bladder is decompressed around a Foley catheter. A 4 mm calcific density along the right bladder wall is similar to the prior study. Enlarged prostate gland measuring up to 6.5 cm in diameter.  Evaluation of the bowel is very limited in the absence of oral contrast. 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 There is  fluid distention of the rectal pouch which has increased in size and more distended compared to the prior study. There is haziness of the fat surrounding the upper border of the rectal pouch with small pockets of air concerning for sutural dehiscence. Multiple decompressed loops of small bowel noted within the abdomen and pelvis. A 15 x 12 cm loculated fluid collection noted in the left hemipelvis containing air and fluid. There has been interval increase in the size of this collection with significant increase in the amount of gas within this collection concerning for developing infection. An ill-defined apparent track like structure may be present extending from this collection to the rectal pouch concerning for developing fistula. There is extension of the gas into the left anterior abdominal musculature adjacent and superior to the loculated peritoneal collection. A 17 x 9 cm loculated fluid collection is noted in the right anterior peritoneum superior to the ileostomy.  Mild aortoiliac atherosclerotic disease. There is no lymphadenopathy. There is a small fat containing left inguinal hernia. Midline vertical anterior abdominal wall incisional scar. Degenerative changes of the spine. No acute fracture.  IMPRESSION: Postsurgical changes of colectomy and right lower quadrant ileostomy. No evidence of bowel obstruction. There has been interval increase in the size of fluid field rectal pouch. Small pockets of air superior to the pouch are concerning for suture dehiscence. Clinical correlation and surgical consult is advised.  Increase in the size of the left hemipelvic peritoneal collection with significant interval increase in the size of the gas within this collection concerning for superimposed infection. An ill-defined track like structure extending from this collection to the rectal pouch is concerning for developing fistula. There is extension of gas in the left anterior abdominal musculature superior to this  collection.  Loculated peritoneal fluid collection in the right anterior hemiabdomen superior to the ileostomy.  Small bilateral pleural effusions with bibasilar subsegmental atelectasis/pneumonia.  These results were called by telephone at the time of interpretation on 07/21/2015 at 3:52 am to the charge nurse, Christell Constant, who verbally acknowledged these results.   Electronically Signed   By: Elgie Collard M.D.   On: 07/21/2015 03:55   Ct Abdomen Pelvis W Contrast  06/26/2015   CLINICAL DATA:  Postop total colectomy for ischemic colitis on 06/18/2015 with abdominal wound closure and ileostomy on 06/20/2015. Patient continues to have leukocytosis with blood cell white count of 220 20,000.  EXAM: CT ABDOMEN AND PELVIS WITH CONTRAST  TECHNIQUE: Multidetector CT imaging of the abdomen and pelvis was performed using the standard protocol following bolus administration of intravenous contrast.  CONTRAST:  OMNIPAQUE IOHEXOL 300 MG/ML IV. Oral contrast was also administered.  COMPARISON:  06/18/2015.  FINDINGS: Interval subtotal colectomy, with a residual Hartmann rectal pouch. Ileostomy in the right upper pelvis in the periumbilical region. No evidence of small bowel obstruction, as the oral contrast progresses throughout the small bowel into the ostomy. Moderately large amount of simple ascites, though the ascites may be loculated as it is confined to the perihepatic and perisplenic regions of the upper abdomen and the lateral portion of both sides of the abdomen in the colectomy spread. No evidence of free intraperitoneal air.  No change in the appearance of the liver, spleen, pancreas, adrenal glands and kidneys. Nonobstructing right upper pole and left lower pole renal calculi again noted. High attenuation material within the gallbladder without evidence of significant gallbladder distention. No biliary ductal dilation.  Urinary bladder decompressed by Foley catheter. Marked prostate gland enlargement as noted  previously. Left inguinal hernia containing fat.  Edema in the subcutaneous fat of the anterior abdominal wall. Small bilateral pleural effusions. Dense consolidation in the lower lobes with air bronchograms, left greater than right.  IMPRESSION: 1. No convincing evidence of intra-abdominal abscess. Simple appearing ascitic fluid is present in the colectomy bed in both sides of the abdomen laterally. Ascites is also present in the upper abdomen in the perisplenic and perihepatic region. 2. High attenuation material within the gallbladder, likely sludge. No calcified gallstones. No evidence of acute cholecystitis or biliary ductal dilation. 3. Small bilateral pleural effusions and associated dense passive atelectasis or pneumonia in the visualized lower lobes, left greater than right.   Electronically Signed   By: Hulan Saas M.D.   On: 06/26/2015 17:35   US Renal Port  07/20/2015   CLINICAL DATA:  Renal failure. Patient on vent. CT congestive heart failure.  EXAM: RENAL / URINARY TRACT ULTRASOUND COMPLETE  COMPARISON:  CT 812 1,016  FINDINGS: Right Kidney:  Length: 11.7 cm.  Normal cortical thickness.  No hydronephrosis.  Left Kidney:  Length: 10.8 cm.  Normal cortical thickness.  No hydronephrosis.  Bladder:  Appears normal for degree of bladder distention.  There several fluid collections within the peritoneal space. These collections have lacy internal echoes consistent with a complex fluid collections. The largest in LEFT upper quadrant and aright which enters 19 x 10 x 17 cm.  IMPRESSION: 1. No hydronephrosis. 2. Multiple complex fluid collections within the peritoneal space. There was fluid within the peritoneal space on comparison CT however the collection appear larger and have internal complexity suggesting peritonitis or abscess. Recommend CT of the abdomen and pelvis with contrast (if possible) for further evaluation. Findings conveyed tocharge nurse Brie on 07/20/2015 at21:44. Attempting to contact  attending physician.   Electronically Signed   By: Genevive Bi M.D.   On: 07/20/2015 21:49   Dg Chest Port 1 View  07/21/2015   CLINICAL DATA:  Respiratory failure, congestive heart failure portable chest x-ray of 07/20/2015  EXAM: PORTABLE CHEST - 1 VIEW  COMPARISON:  None.  FINDINGS: The tip of the endotracheal tube is approximately 4.4 cm above the carina. There has been slight worsening of bibasilar in linear atelectasis. Mild cardiomegaly is stable. The right central venous line extends cephalad, probably within the right internal jugular vein, unchanged. The tip is not visualized.  IMPRESSION: 1. Diminished aeration with increasing bibasilar atelectasis. 2. Tip of endotracheal tube approximately 4.4 cm above the carina. 3. As before, the right IJ central venous line extends into the neck.   Electronically Signed   By: Dwyane Dee M.D.   On: 07/21/2015 08:13  Dg Chest Port 1 View  07/20/2015   CLINICAL DATA:  Endotracheal tube and NG tube placement  EXAM: PORTABLE CHEST - 1 VIEW  COMPARISON:  07/19/2015  FINDINGS: There is endotracheal tube with the tip at the right mainstem bronchus; recommend retracting the endotracheal tube 2.5 cm.  There is mild bilateral interstitial prominence. There is left basilar linear airspace disease likely reflecting atelectasis. There is no pleural effusion or pneumothorax. There is stable mild cardiomegaly. There is evidence of prior CABG.  The osseous structures are unremarkable.  IMPRESSION: 1. Endotracheal tube with the tip at the right mainstem bronchus; recommend retracting the endotracheal tube 2.5 cm.   Electronically Signed   By: Elige Ko   On: 07/20/2015 18:57   Dg Chest Port 1 View  07/19/2015   CLINICAL DATA:  Rule out pneumonia.  EXAM: PORTABLE CHEST - 1 VIEW  COMPARISON:  07/07/2015  FINDINGS: There is bibasilar hazy airspace disease. There is no pleural effusion or pneumothorax. The heart mediastinum are stable. There is evidence of prior CABG.  The  osseous structures are unremarkable.  IMPRESSION: Bibasilar hazy airspace disease which may reflect atelectasis versus pneumonia.   Electronically Signed   By: Elige Ko   On: 07/19/2015 14:36   Dg Chest Port 1 View  07/07/2015   CLINICAL DATA:  Shortness of breath.  EXAM: PORTABLE CHEST - 1 VIEW  COMPARISON:  07/06/2015.  FINDINGS: Tracheostomy tube and right PICC line stable position. Prior CABG. Cardiomegaly with mild pulmonary vascular prominence. Bibasilar atelectasis and/or infiltrates are noted. No pleural effusion or pneumothorax.  IMPRESSION: 1. Tracheostomy tube and right PICC line in stable position. 2. Prior CABG. Cardiomegaly with mild pulmonary vascular prominence. 3. Low lung volumes with bibasilar subsegmental atelectasis and/or infiltrates. A component of mild bibasilar pneumonia and/or pulmonary edema cannot be excluded .   Electronically Signed   By: Maisie Fus  Register   On: 07/07/2015 07:11   Dg Chest Port 1 View  07/06/2015   CLINICAL DATA:  Respiratory failure.  EXAM: PORTABLE CHEST - 1 VIEW  COMPARISON:  07/05/2015.  FINDINGS: Interval removal of feeding tube. Tracheostomy tube, right PICC line in stable position. Bibasilar subsegmental atelectasis. No pleural effusion or pneumothorax. Prior CABG. Cardiomegaly. No acute bony abnormality.  IMPRESSION: 1. Interval removal of feeding tube. Tracheostomy tube and right PICC line stable position.  2. Prior CABG. Cardiomegaly. No evidence of overt congestive heart failure on today's exam.  3.  Low lung volumes with bibasilar subsegmental atelectasis.   Electronically Signed   By: Maisie Fus  Register   On: 07/06/2015 07:47   Dg Chest Port 1 View  07/05/2015   CLINICAL DATA:  Panda tube stuck.  Initial encounter.  EXAM: PORTABLE CHEST - 1 VIEW  COMPARISON:  Chest radiograph performed 07/04/2015  FINDINGS: The patient's tracheostomy tube is seen ending 5 cm above the carina. A right PICC is noted likely extending into the right atrium, difficult  to fully assess.  The patient's Panda tube tip is noted overlying the distal aspect of the tracheostomy tube. Would correlate clinically to assess whether it is lodged against the edge of the tracheostomy tube, whether in the trachea or esophagus.  The lungs are hypoexpanded. Vascular congestion is noted. Mild bibasilar opacities likely reflect mild interstitial edema, similar in appearance to the prior study.  The cardiomediastinal silhouette is enlarged. The patient is status post median sternotomy. No acute osseous abnormalities are identified.  IMPRESSION: 1. Panda tube tip noted overlying the distal aspect of  the tracheostomy tube. Would correlate clinically to assess whether it is lodged against the edge of the tracheostomy tube, whether in the trachea or esophagus. The Panda tube was subsequently removed. 2. Right PICC noted likely extending into the right atrium, not well assessed. 3. Lungs hypoexpanded. Vascular congestion and cardiomegaly. Mild bibasilar airspace opacities likely reflect mild interstitial edema, similar in appearance to the prior study.   Electronically Signed   By: Roanna Raider M.D.   On: 07/05/2015 01:44   Dg Chest Port 1 View  07/04/2015   CLINICAL DATA:  Respiratory failure.  EXAM: PORTABLE CHEST - 1 VIEW  COMPARISON:  Chest radiograph 07/02/2015  FINDINGS: New tracheostomy tube terminates in the mid trachea. Right upper extremity PICC line is visualized to the superior cavoatrial junction. Enteric tube is visualized to level of diaphragm. The tip is not currently demonstrated. Interval removal left internal jugular central venous catheter. Stable enlarged cardiac and mediastinal contours. Low lung volumes. Persistent bilateral interstitial pulmonary opacities. No definite pleural effusion or pneumothorax.  IMPRESSION: Cardiomegaly with pulmonary vascular congestion and mild interstitial edema.  Tracheostomy tube terminates in the mid trachea. Right upper extremity PICC line is  visualized to the superior cavoatrial junction.  Enteric tube tip is not able to be visualized due to technique on current radiograph. It is visualized to the level the diaphragm.   Electronically Signed   By: Annia Belt M.D.   On: 07/04/2015 11:19   Dg Chest Port 1 View  07/02/2015   CLINICAL DATA:  Respiratory failure.  EXAM: PORTABLE CHEST - 1 VIEW  COMPARISON:  07/01/2015 .  FINDINGS: Endotracheal tube, left IJ line, NG tube in stable position. Mediastinum and hilar structures are normal. Prior CABG. Stable cardiomegaly. Interim partial clearing of bibasilar atelectasis and/or infiltrates. Small left pleural effusion. No pneumothorax.  IMPRESSION: 1. Lines and tubes in stable position. 2. Interim partial clearing of bibasilar atelectasis and/or infiltrates. Small left pleural effusion. 3. Stable cardiomegaly.  Prior CABG.   Electronically Signed   By: Maisie Fus  Register   On: 07/02/2015 07:20   Dg Chest Port 1 View  07/01/2015   CLINICAL DATA:  69 year old male with ventilator dependent respiratory failure. Initial encounter.  EXAM: PORTABLE CHEST - 1 VIEW  COMPARISON:  06/30/2015 and earlier.  FINDINGS: Portable AP semi upright view at 0557 hours. Stable endotracheal tube position. Enteric tube courses to the abdomen, tip not included. Stable left IJ approach central line.  Lung volumes have not significantly changed. Veiling confluent opacity at both lung base compatible with pleural effusions and lower lobe collapse. No pneumothorax. Stable pulmonary vascularity without overt edema. Overall ventilation is mildly decreased. Stable cardiac size and mediastinal contours. Sequelae of CABG.  IMPRESSION: 1.  Stable lines and tubes. 2. Bilateral pleural effusions and lower lobe atelectasis, ventilation has mildly decreased since yesterday.   Electronically Signed   By: Odessa Fleming M.D.   On: 07/01/2015 07:48   Dg Chest Port 1 View  06/30/2015   CLINICAL DATA:  Acute respiratory failure requiring intubation.   EXAM: PORTABLE CHEST - 1 VIEW  COMPARISON:  06/24/2015 and earlier.  FINDINGS: Endotracheal tube tip projects approximately 3 cm above the carina. Left jugular central venous catheter tip projects at the junction of the innominate vein and SVC. Since examination 6 days ago, improved aeration in the lung bases, though atelectasis persists, left greater than right. No new pulmonary parenchymal abnormalities.  IMPRESSION: 1. Endotracheal tube tip in satisfactory position projecting approximately 3 cm above  the carina. 2. Left jugular venous catheter tip projects at the junction of the innominate vein and SVC, unchanged. 3. Improved aeration in the lung bases since 06/24/2015, with bibasilar atelectasis persisting, left greater than right. 4. No new abnormalities.   Electronically Signed   By: Hulan Saas M.D.   On: 06/30/2015 17:15   Dg Chest Port 1 View  06/24/2015   CLINICAL DATA:  Elevated white blood cell count, possible aspiration, shock.  EXAM: PORTABLE CHEST - 1 VIEW  COMPARISON:  Portable chest x-ray of June 23, 2015  FINDINGS: There is increased density at the right lung base as compared to the previous study. The retrocardiac region on the left is more dense and the left hemidiaphragm less well demonstrated. The cardiac silhouette remains enlarged. The central pulmonary vascularity is prominent. There are post CABG changes. The esophagogastric tube has been removed. The left internal jugular venous catheter projects over the proximal SVC.  IMPRESSION: Increased density at both lung bases consistent with progressive atelectasis or pneumonia. Stable low-grade CHF   Electronically Signed   By: David  [place] M.D.   On: 06/24/2015 09:59   Dg Chest Port 1 View  06/23/2015   CLINICAL DATA:  Follow-up of respiratory failure  EXAM: PORTABLE CHEST - 1 VIEW  COMPARISON:  Portable chest x-ray of June 22, 2015  FINDINGS: The lungs are reasonably well inflated. The interstitial markings are less prominent  today. There is subsegmental atelectasis in the left perihilar region which is more stable. There is better visualization of the lateral aspect of the left hemidiaphragm. The cardiac silhouette remains quite enlarged. The central pulmonary vascularity though mildly prominent is more distinct today. The left internal jugular venous catheter tip projects at the junction of the right and left brachiocephalic veins. The nasogastric tube tip projects below the inferior margin of the image. There is 7 intact sternal wires from previous CABG.  IMPRESSION: Improving interstitial edema. New left perihilar subsegmental atelectasis. Stable enlargement of the cardiac silhouette.   Electronically Signed   By: David  [place] M.D.   On: 06/23/2015 07:37   Dg Chest Port 1 View  06/22/2015   CLINICAL DATA:  Acute respiratory failure.  EXAM: PORTABLE CHEST - 1 VIEW  COMPARISON:  One-view chest x-ray 06/21/2015  FINDINGS: The heart is enlarged. The endotracheal tube terminates 3 cm above the carina, in satisfactory position. An NG tube courses off the inferior border of the film. A left IJ line is stable, terminating within the innominate vein. The mild edema and a probable small left effusion is unchanged. Bibasilar airspace disease likely reflects atelectasis, worse on the left.  IMPRESSION: 1. Stable cardiomegaly and mild edema compatible with congestive heart failure. 2. Mild bibasilar airspace disease likely reflects atelectasis and effusions. 3. Support apparatus is stable.   Electronically Signed   By: Marin Roberts M.D.   On: 06/22/2015 09:29   Dg Abd Portable 1v  07/21/2015   CLINICAL DATA:  Orogastric tube placement  EXAM: PORTABLE ABDOMEN - 1 VIEW  COMPARISON:  None.  FINDINGS: The orogastric tube is not well seen. There is a tube along the superior aspect of the image with what appears to be a side-port slightly to the right of midline suggesting that the orogastric tube tip and side port are in the distal  stomach. Bowel gas pattern unremarkable. There is degenerative change in the lumbar spine.  IMPRESSION: Orogastric tube less than optimally seen. Suspect tube tip and side port in distal stomach, although a  repeat study centered over the upper abdomen is advised to confirm this observation. Bowel gas pattern which is visualized appears unremarkable.   Electronically Signed   By: Bretta Bang III M.D.   On: 07/21/2015 08:05   Dg Abd Portable 1v  07/20/2015   CLINICAL DATA:  Nasogastric tube placement.  EXAM: PORTABLE ABDOMEN - 1 VIEW  COMPARISON:  07/03/2015  FINDINGS: Nasogastric tube is not visible despite the history. Endotracheal tube is in place, tip approximately 2 cm above carina. There are coarse markings in the lungs.  IMPRESSION: No enteric tube identified.   Electronically Signed   By: Norva Pavlov M.D.   On: 07/20/2015 18:15   Dg Abd Portable 1v  07/03/2015   CLINICAL DATA:  Feeding tube placement.  EXAM: PORTABLE ABDOMEN - 1 VIEW  COMPARISON:  CT scan 06/26/2015  FINDINGS: The feeding tube tip is in the region of the duodenum bulb. Paucity of bowel gas.  IMPRESSION: Feeding tube tip is in the region of the duodenum bulb.   Electronically Signed   By: Rudie Meyer M.D.   On: 07/03/2015 13:32   Dg Hand Complete Left  07/15/2015   CLINICAL DATA:  Pain over the fifth metacarpal  EXAM: LEFT HAND - COMPLETE 3+ VIEW  COMPARISON:  None.  FINDINGS: There is no acute fracture. There is widening of the scapholunate distance with rotation of the lunate and proximal migration of the capitate as can be seen with SLAC wrist. 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 There is calcification adjacent to the ulnar styloid process dorsally. There is no dislocation. There is no lytic or sclerotic osseous lesion. There is no soft tissue abnormality. There is peripheral vascular atherosclerotic disease.  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   Electronically Signed   By: Elige Ko   On: 07/15/2015 16:15   Dg Swallowing Func-speech Pathology  07/07/2015    Objective Swallowing Evaluation:   Modified Barium Swallow Patient Details  Name: Jonathan Le MRN: [id number] Date of Birth: December 27, 1945  Today's Date: 07/07/2015 Time: SLP Start Time (ACUTE ONLY): 1210-SLP Stop Time (ACUTE ONLY): 1235 SLP Time Calculation (min) (ACUTE ONLY): 25 min  Past Medical History:  Past Medical History  Diagnosis Date  . Anxiety   . Hypertension   . Arthritis   . Sleep apnea     wears CPAP nightly  . Glaucoma   . CHF (congestive heart failure)     MI, with open heart surgery x 30 sdays ago per pt. (06/26/15)   Past Surgical History:  Past Surgical History  Procedure Laterality Date  . Hernia repair      right  . Eye surgery      bil cataract  . Wisdom tooth extraction    . Fasciotomy foot / toe Left     foot  . Cardiac catheterization N/A 05/20/2015    Procedure: Left Heart Cath and Coronary Angiography;  Surgeon:  Kathleene Hazel, MD;  Location: [facility] INVASIVE CV LAB;  Service:  Cardiovascular;  Laterality: N/A;  . Coronary artery bypass graft N/A 05/22/2015    Procedure: CORONARY ARTERY BYPASS GRAFTING (CABG), ON PUMP, TIMES THREE,  USING LEFT INTERNAL MAMMARY ARTERY, RIGHT GREATER SAPHENOUS VEIN HARVESTED  ENDOSCOPICALLY;  Surgeon: Kerin Perna, MD;  Location: [facility] OR;  Service:  Open Heart Surgery;  Laterality: N/A;  LIMA-LAD, SVG-OM, SVG-PD  . Tee without cardioversion N/A 05/22/2015    Procedure: TRANSESOPHAGEAL ECHOCARDIOGRAM (TEE);  Surgeon: Kathlee Nations  Maudie Flakes, MD;  Location: [facility] OR;  Service: Open Heart Surgery;  Laterality:  N/A;  . Laparotomy N/A 06/18/2015    Procedure: EXPLORATORY LAPAROTOMY;  Surgeon: Emelia Loron, MD;   Location: [facility] OR;  Service: General;  Laterality: N/A;  . Laparotomy N/A 06/20/2015    Procedure: WASHOUT OF ABDOMEN, CLOSURE OF ABDOMINAL WALL WITH PLACEMENT  OF WOUND VAC AND ILLEOSTOMY ;  Surgeon: Almond Lint, MD;  Location: [facility]  OR;  Service: General;  Laterality: N/A;  .  Tracheostomy tube placement N/A 07/03/2015    Procedure: TRACHEOSTOMY;  Surgeon: Serena Colonel, MD;  Location: [facility] OR;   Service: ENT;  Laterality: N/A;   HPI:  Other Pertinent Information: 69 year old male with PMH: OSA, HTN, anxiety  CABG 05/2015 admitted after becoming diaphoretic and LOC. Pt found to have  septic shock secondary to ischemic colon. Intubated 8/4-8/8 (self  extubated), reintubated 8/16 due to possible aspiration and received trach  07/03/15 ( #8, cuff). MBS recommended (may not cooperate for FEES; confused  this am)    No Data Recorded  Assessment / Plan / Recommendation CHL IP CLINICAL IMPRESSIONS 07/07/2015  Therapy Diagnosis Mild oral phase dysphagia;Moderate pharyngeal phase  dysphagia  Clinical Impression Pt exhibited improvements (mild) in swallow function  with Passy-Muir speaking valve donned as compared to FEES 06/26/15. He  demonstrated mild oral delayed mastication and transit of solid texture  and delayed swallow initiation with moderate vallecular and pyriform  sinsus residue. Decreased sensation, reduced tongue base retraction and  decreased laryngeal elevation led to silent penetration and aspiration  with thin and nectar textures. Pt able to reduce residue with verbal cues  for second swallow. Volitional cough propelled partial penetrates from  vestibule. Recommend Dys 1 texture and honey thick liquids, crushed pills,  must wear valve with meals/meds and needs FULL supervision due to  cognitive deficits to reduce aspiration risk. Will continue to follos        CHL IP TREATMENT RECOMMENDATION 07/07/2015  Treatment Recommendations Therapy as outlined in treatment plan below     CHL IP DIET RECOMMENDATION 07/07/2015  SLP Diet Recommendations Dysphagia 1 (Puree);1:1 - comment  Liquid Administration via (None)  Medication Administration Crushed with puree  Compensations Small sips/bites;Slow rate;Multiple dry swallows after each  bite/sip;Clear throat intermittently  Postural Changes and/or  Swallow Maneuvers (None)     CHL IP OTHER RECOMMENDATIONS 07/07/2015  Recommended Consults (None)  Oral Care Recommendations Oral care BID  Other Recommendations Order thickener from pharmacy     CHL IP FOLLOW UP RECOMMENDATIONS 07/07/2015  Follow up Recommendations 24 hour supervision/assistance;Skilled Nursing  facility     CHL IP FREQUENCY AND DURATION 07/07/2015  Speech Therapy Frequency (ACUTE ONLY) min 2x/week  Treatment Duration 2 weeks     Pertinent Vitals/Pain none    SLP Swallow Goals No flowsheet data found.  No flowsheet data found.    CHL IP REASON FOR REFERRAL 07/07/2015  Reason for Referral Objectively evaluate swallowing function               No flowsheet data found.         Royce Macadamia 07/07/2015, 1:47 PM  Breck Coons Lonell Face.Ed CCC-SLP Pager [phone]      Labs:  CBC:  Recent Labs  07/17/15 0733 07/19/15 1532 07/20/15 0550 07/21/15 0405  WBC 8.5 10.1 18.5* 18.3*  HGB 10.5* 11.1* 10.4* 11.5*  HCT 34.1* 37.0* 34.0* 38.2*  PLT 308 303 323 275    COAGS:  Recent Labs  07/06/15 0600 07/07/15 1003 07/08/15 0515 07/21/15 0815  INR 1.51* 1.48 1.49 1.65*  APTT 24 26 29 29     BMP:  Recent Labs  07/17/15 0733 07/19/15 1532 07/20/15 0550 07/21/15 0405  NA 144 150* 149* 145  K 4.0 4.8 5.2* 4.3  CL 115* 123* 124* 120*  CO2 22 19* 13* 14*  GLUCOSE 124* 96 129* 156*  BUN 48* 59* 71* 73*  CALCIUM 8.7* 9.0 8.4* 8.2*  CREATININE 0.87 2.34* 2.90* 2.23*  GFRNONAA >60 27* 21* 28*  GFRAA >60 31* 24* 33*    LIVER FUNCTION TESTS:  Recent Labs  06/22/15 0405 06/25/15 0430 06/29/15 0610 07/21/15 0405  BILITOT 0.9 1.0 1.2 0.9  AST 22 21 29 27   ALT 36 14* 16* 22  ALKPHOS 80 75 85 136*  PROT 4.1* 4.0* 5.1* 5.3*  ALBUMIN 1.5* 1.1* 1.3* 1.2*    TUMOR MARKERS: No results for input(s): AFPTM, CEA, CA199, CHROMGRNA in the last 8760 hours.  Assessment and Plan:  Ischemic bowel- colectomy 06/18/15 Now intra abdominal abscesses Scheduled for abscess drain  placement Risks and Benefits discussed with the patient's wife including bleeding, infection, damage to adjacent structures, bowel perforation/fistula connection, and sepsis. All of her questions were answered, she is agreeable to proceed. Consent signed and in chart.   Thank you for this interesting consult.  I greatly enjoyed meeting Jonathan Le and look forward to participating in their care.  A copy of this report was sent to the requesting provider on this date.  Signed: TURPIN,PAMELA A 07/21/2015, 9:54 AM   I spent a total of 40 Minutes    in face to face in clinical consultation, greater than 50% of which was counseling/coordinating care for abd abscess drain

## 2015-07-21 NOTE — H&P (Addendum)
PULMONARY / CRITICAL CARE MEDICINE   Name: Jonathan Le MRN: [id number] DOB: 1946-09-30    ADMISSION DATE:  07/21/2015 CONSULTATION DATE: 06/18/15   CHIEF COMPLAINT:  Septic shock secondary to ischemic colon. Acute respiratiory failure. Acute renal failure.  INITIAL PRESENTATION: 69 year old male with PMH of OSA and HTN. 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 Post discharge course complicated by R leg cellulitis after endoscopic vein harvest he was seen by Dr. Donata Clay in clinic for this,was having some constipation. 8/4 PM he took an enema and was straining in bathroom. He became diaphoretic and had loss of consciousness. In ED he was noted to be profoundly hypotensive with SOB. Found to be in severe septic shock with presumed ischemic colon.  He was then taken to the OR emergently by Dr. Dwain Sarna and had abdominal colectomy 8/4.  Returned to OR 8/6 for abdominal closure with ileostomy. Post operatively, he self extubated on 8/8.   He required re-intubation and on 8/19, he subsequently had tracheostomy performed by Dr. Pollyann Kennedy of ENT. On 8/24, he was discharged to [facility].  While on [facility], he self de-cannulated at some point and on 9/5, he had a suspected aspiration event and developed respiratory failure. PCCM was consulted and pt was intubated.  Later that night, he had ongoing hypotension despite increasing dose of levophed.  Renal US earlier in the day revealed findings concerning for peritonitis vs abscess.  Pt was transferred to [facility] ICU and CCS was consulted for further recs.   STUDIES:  Echo 8/4 >>> EF 25% to 30%. Severe hypokinesis of the anteroseptal and anterior myocardium.Dyskinesis of the apical myocardium. CT chest / abd pelv 8/4 >>>mod to severe cardiomegaly with small pericardial effusion.  Pulmonary edema  with small pleural effusions.  Focal colonic hepatic flexure pneumatosis without perforation.  Bilateral nonobstructing nephrolithiasis. CT A / P 8/12 >>> no evidence abscess.  Ascites present, GB sludge, small b/l pleural effusions. Renal US 9/5 >>>  No hydronephrosis, multiple complex fluid collections within the peritoneal space.  Fluid spaces larger with internal complexity suggesting peritonitis or abscess. CT A / P 9/5 >>>   SIGNIFICANT EVENTS: 8/4 Total abdominal colectomy with abdominal vac placement 8/6 Exploratory laparotomy, removal of prior packs, closure of open abdomen, ileostomy 8/7 high pressors 8/8 self extubated 8/9  resp distress , required bipap , lasix 8/13 had to restart levophed for shock overnight. 8/16 reintubated, primarily for MS, agitation, tachypnea 8/19 Trach placed 8/22 tolerating TC. 8/23 to [facility] 9/5 PCCM called back for aspiration PNA and respiratory failure.  Pt reintubated.  Later transferred to [facility] ICU for worsening septic shock.  SUBJECTIVE:   Intubated earlier in the day.  Tonight has had ongoing shock despite increasing levophed dose.  Given renal ultrasound findings concerning for peritonitis vs abscess, will transfer to ICU and ask general surgery to see.  VITAL SIGNS:There were no vitals taken for this visit.   VENTILATOR SETTINGS: Vent Mode:  [-] PRVC FiO2 (%):  [40 %] 40 % Set Rate:  [14 bmp] 14 bmp Vt Set:  [500 mL] 500 mL PEEP:  [5 cmH20] 5 cmH20 Plateau Pressure:  [22 cmH20] 22 cmH20PRVC  INTAKE / OUTPUT: No intake or output data in the 24 hours ending 07/21/15 0120  PHYSICAL EXAMINATION: General: Adult male, on vent Neuro:  Not following commands., rass -2 Head: [place]/AT, no neck line EENT: PERRL, no JVD noted, trach site clean Cardiovascular: RRR, no M/R/G. Lungs:CTA bilateral Abdomen: Soft, non-tender, surgical dressing with vac in place, ileostomy in place. Ext: 2+ edema  LABS:  CBC  Recent Labs Lab 07/17/15 0733  07/19/15 1532 07/20/15 0550  WBC 8.5 10.1 18.5*  HGB 10.5* 11.1* 10.4*  HCT 34.1* 37.0* 34.0*  PLT 308 303 323   Coag's No results for input(s): APTT, INR in the last 168 hours. BMET  Recent Labs Lab 07/17/15 0733 07/19/15 1532 07/20/15 0550  NA 144 150* 149*  K 4.0 4.8 5.2*  CL 115* 123* 124*  CO2 22 19* 13*  BUN 48* 59* 71*  CREATININE 0.87 2.34* 2.90*  GLUCOSE 124* 96 129*   Electrolytes  Recent Labs Lab 07/17/15 0733 07/19/15 1532 07/20/15 0550  CALCIUM 8.7* 9.0 8.4*   Sepsis Markers  Recent Labs Lab 07/20/15 0857  PROCALCITON 2.82   ABG  Recent Labs Lab 07/19/15 1130  PHART 7.446  PCO2ART 27.2*  PO2ART 71.3*   Liver Enzymes No results for input(s): AST, ALT, ALKPHOS, BILITOT, ALBUMIN in the last 168 hours. Cardiac Enzymes No results for input(s): TROPONINI, PROBNP in the last 168 hours. Glucose No results for input(s): GLUCAP in the last 168 hours. Imaging US Renal Port  07/20/2015   CLINICAL DATA:  Renal failure. Patient on vent. CT congestive heart failure.  EXAM: RENAL / URINARY TRACT ULTRASOUND COMPLETE  COMPARISON:  CT 812 1,016  FINDINGS: Right Kidney:  Length: 11.7 cm.  Normal cortical thickness.  No hydronephrosis.  Left Kidney:  Length: 10.8 cm.  Normal cortical thickness.  No hydronephrosis.  Bladder:  Appears normal for degree of bladder distention.  There several fluid collections within the peritoneal space. These collections have lacy internal echoes consistent with a complex fluid collections. The largest in LEFT upper quadrant and aright which enters 19 x 10 x 17 cm.  IMPRESSION: 1. No hydronephrosis. 2. Multiple complex fluid collections within the peritoneal space. There was fluid within the peritoneal space on comparison CT however the collection appear larger and have internal complexity suggesting peritonitis or abscess. Recommend CT of the abdomen and pelvis with contrast (if possible) for further evaluation. Findings conveyed  tocharge nurse Brie on 07/20/2015 at21:44. Attempting to contact attending physician.   Electronically Signed   By: Genevive Bi M.D.   On: 07/20/2015 21:49   Dg Chest Port 1 View  07/20/2015   CLINICAL DATA:  Endotracheal tube and NG tube placement  EXAM: PORTABLE CHEST - 1 VIEW  COMPARISON:  07/19/2015  FINDINGS: There is endotracheal tube with the tip at the right mainstem bronchus; recommend retracting the endotracheal tube 2.5 cm.  There is mild bilateral interstitial prominence. There is left basilar linear airspace disease likely reflecting atelectasis. There is no pleural effusion or pneumothorax. There is stable mild cardiomegaly. There is evidence of prior CABG.  The osseous structures are unremarkable.  IMPRESSION: 1. Endotracheal tube with the tip at the right mainstem bronchus; recommend retracting the endotracheal tube 2.5 cm.   Electronically Signed   By: Elige Ko   On: 07/20/2015 18:57   Dg Abd Portable 1v  07/20/2015   CLINICAL DATA:  Nasogastric tube placement.  EXAM: PORTABLE ABDOMEN - 1 VIEW  COMPARISON:  07/03/2015  FINDINGS: Nasogastric tube is not visible despite the history. Endotracheal tube is in place, tip approximately 2 cm above carina. There are coarse markings in the lungs.  IMPRESSION: No enteric tube identified.   Electronically Signed   By: Norva Pavlov M.D.   On: 07/20/2015 18:15    ASSESSMENT / PLAN:  PULMONARY ETT 8/4 >>8/8 ETT 8/16 >> 8/19 Trach 8/19 (Dr Pollyann Kennedy) >> self decannulated exact date unknown. ETT 9/5>>> A:  VDRF - reintubated 9/5 for aspiration and inability to protect airway. OSA. Aspiration PNA. P:   Full vent support 40% peep 5, 14, 560, increase rate 26, abg to follow No sbt  pcxr in am   CARDIOVASCULAR A:  Septic shock - aspiration PNA + suspected intraabdominal process (peritonitis vs abdominal abscess). Ischemic cardiomyopathy - hx CABG on 7/16 P:  Map goal 65 I just now seeing this patient 945 am, the picc is upward, we  need to avoid high dose or any levophed into this Will place stat new line neck and dc picc and use pressors through new line May need add vaso, some reluctance  With prior ischemia bowel Need source control stat for IR Place a line Cortisol, add empiric stress roids after ecg May need epi Need cvp after Repeat lactic  RENAL A:   AKI - suspect pre-renal from low flow state / hypoperfusion. Mild hypernatremia. NAG metabolic acidosis. ? Hypocalcemia - no albumin to correct for. P:   Correct electrolytes as indicated.  Send ionized calcium. STAT CMP. BMP in AM.  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 GI prophylaxis. Nutrition. P:   STAT CT abd / pelvis. Consult CCS depending on CT results. Protonix for SUP. Hold TF's. NPO.  HEMATOLOGIC A:  Anemia - chronic. VTE prophylaxis. P:  Transfuse per usual ICU guidelines. SCD's / Heparin.  INFECTIOUS A:   Septic shock - aspiration PNA + suspected intraabdominal process (peritonitis vs abdominal abscess). R LE ulcer, chronic. P:   STAT CT abd / pelvis. BCx 9/4 >>> Sputum 9/4 >>> Abx: Vanc, Imipenem (penicillin allergy).   ENDOCRINE A:   Hyperglycemia without hx of DM. P: SSI. Check TSH, cortisol.   NEUROLOGIC A:  Acute Metabolic encephalopathy. Pain, Anxiety. Generalized deconditioning. P:   Sedation:  Fentanyl gtt / Midazolam PRN. RASS goal: 0 to -1. Daily WUA.  Family updates:  None.  Interdisciplinary meeting due by:  9/12.  Ccm time 35 min   STAFF NOTE: I, Rory Percy, MD FACP have personally reviewed patient's available data, including medical history, events of note, physical examination and test results as part of my evaluation. I have discussed with resident/NP and other care providers such as pharmacist, RN and RRT. In addition, I personally evaluated patient and elicited key findings of: high rate on vent, abdo no BS, obese, distended,  refractory shock, high risk death, need immediate source control to IR stat, add aningilofungin, get cvp, bolus further, repeat la, continue imi, vanc, place aline, increase MV vent, may need paralysis, with increase sedation, i called and updated wife also of high risk death, avoiding vaso with prior ischemic bowel, once we place new line will dc picc and not use pressors with this, may need epi drip, post drains will anticipate massive sirs response, requires versed, fent  And then nimbex, i wonder if he has a fistula The patient is critically ill with multiple organ systems failure and requires high complexity decision making for assessment and support, frequent evaluation and titration of therapies, application of advanced monitoring technologies and extensive interpretation of multiple databases.   Critical Care Time devoted to patient care services described in this  note is90 Minutes. This time reflects time of care of this signee: Rory Percy, MD FACP. This critical care time does not reflect procedure time, or teaching time or supervisory time of PA/NP/Med student/Med Resident etc but could involve care discussion time. Rest per NP/medical resident whose note is outlined above and that I agree with   Mcarthur Rossetti. Tyson Alias, MD, FACP Pgr: [phone] [facility] 07/21/2015 10:07 AM    Mcarthur Rossetti. Tyson Alias, MD, FACP Pgr: [phone] [facility]

## 2015-07-21 NOTE — Procedures (Signed)
RLQ abscess drain with pus LLQ abscess drain with serosanguinous No comp/EBL

## 2015-07-21 NOTE — Progress Notes (Addendum)
ANTIBIOTIC CONSULT NOTE - INITIAL  Pharmacy Consult for Vancomycin and Primaxin Indication: rule out sepsis  Allergies  Allergen Reactions  . Levaquin [Levofloxacin] Nausea Only  . Penicillins Hives  . Doxycycline Rash    Patient Measurements: Weight: 253 lb 12 oz (115.1 kg) Adjusted Body Weight: 90 kg  Vital Signs: BP: 95/64 mmHg (09/06 0130) Pulse Rate: 104 (09/06 0130) Intake/Output from previous day:   Intake/Output from this shift:    Labs:  Recent Labs  07/19/15 1532 07/20/15 0550  WBC 10.1 18.5*  HGB 11.1* 10.4*  PLT 303 323  CREATININE 2.34* 2.90*   Estimated Creatinine Clearance: 30.1 mL/min (by C-G formula based on Cr of 2.9).  Recent Labs  07/20/15 2205  [facility] 10     Microbiology: Recent Results (from the past 720 hour(s))  Culture, respiratory (NON-Expectorated)     Status: None   Collection Time: 06/30/15  5:45 PM  Result Value Ref Range Status   Specimen Description TRACHEAL ASPIRATE  Final   Special Requests Normal  Final   Gram Stain   Final    FEW WBC PRESENT,BOTH PMN AND MONONUCLEAR RARE SQUAMOUS EPITHELIAL CELLS PRESENT RARE GRAM POSITIVE COCCI IN PAIRS Performed at [facility]    Culture   Final    Non-Pathogenic Oropharyngeal-type Flora Isolated. Performed at [facility]    Report Status 07/02/2015 FINAL  Final  Culture, blood (single)     Status: None   Collection Time: 07/02/15  9:53 AM  Result Value Ref Range Status   Specimen Description BLOOD LEFT HAND  Final   Special Requests BOTTLES DRAWN AEROBIC ONLY 5CC  Final   Culture NO GROWTH 5 DAYS  Final   Report Status 07/07/2015 FINAL  Final  Culture, respiratory (NON-Expectorated)     Status: None   Collection Time: 07/05/15  4:25 PM  Result Value Ref Range Status   Specimen Description TRACHEAL ASPIRATE  Final   Special Requests NONE  Final   Gram Stain   Final    FEW WBC PRESENT, PREDOMINANTLY PMN RARE SQUAMOUS EPITHELIAL CELLS PRESENT FEW  GRAM POSITIVE COCCI IN PAIRS RARE GRAM NEGATIVE RODS Performed at [facility]    Culture   Final    MODERATE GROUP B STREP(S.AGALACTIAE)ISOLATED Note: TESTING AGAINST S. AGALACTIAE NOT ROUTINELY PERFORMED DUE TO PREDICTABILITY OF AMP/PEN/VAN SUSCEPTIBILITY. Performed at [facility]    Report Status 07/08/2015 FINAL  Final  Culture, blood (routine x 2)     Status: None   Collection Time: 07/05/15  6:52 PM  Result Value Ref Range Status   Specimen Description BLOOD LEFT ANTECUBITAL  Final   Special Requests   Final    BOTTLES DRAWN AEROBIC AND ANAEROBIC 10CC BLUE 7CC RED   Culture NO GROWTH 5 DAYS  Final   Report Status 07/10/2015 FINAL  Final  Culture, blood (routine x 2)     Status: None   Collection Time: 07/05/15  7:00 PM  Result Value Ref Range Status   Specimen Description BLOOD LEFT WRIST  Final   Special Requests BOTTLES DRAWN AEROBIC AND ANAEROBIC 10CC  Final   Culture NO GROWTH 5 DAYS  Final   Report Status 07/10/2015 FINAL  Final  MRSA PCR Screening     Status: None   Collection Time: 07/07/15  4:05 PM  Result Value Ref Range Status   MRSA by PCR NEGATIVE NEGATIVE Final    Comment:        The GeneXpert MRSA Assay (FDA approved  for NASAL specimens only), is one component of a comprehensive MRSA colonization surveillance program. It is not intended to diagnose MRSA infection nor to guide or monitor treatment for MRSA infections.   Culture, blood (routine x 2)     Status: None (Preliminary result)   Collection Time: 07/19/15 10:18 PM  Result Value Ref Range Status   Specimen Description BLOOD PICC LINE  Final   Special Requests BOTTLES DRAWN AEROBIC AND ANAEROBIC 10CC  Final   Culture NO GROWTH < 24 HOURS  Final   Report Status PENDING  Incomplete  Culture, blood (routine x 2)     Status: None (Preliminary result)   Collection Time: 07/19/15 10:19 PM  Result Value Ref Range Status   Specimen Description BLOOD LEFT HAND  Final   Special  Requests BOTTLES DRAWN AEROBIC AND ANAEROBIC 5CC  Final   Culture NO GROWTH < 24 HOURS  Final   Report Status PENDING  Incomplete    Medical History: Past Medical History  Diagnosis Date  . Anxiety   . Hypertension   . Arthritis   . Sleep apnea     wears CPAP nightly  . Glaucoma   . CHF (congestive heart failure)     MI, with open heart surgery x 30 sdays ago per pt. (06/26/15)   Assessment: 69 y.o. male admitted with VDRF/ARF/septic shock for empiric antibiotics.  Received vancomycin 1 g IV at ~ 2 am 9/5 at [facility].     Goal of Therapy:  Vancomycin trough level 15-20 mcg/ml  Plan:  Vancomycin 1 g IV now.  F/U renal function for additional doses Primaxin 500 mg IV now, then 250 mg IV q12h  Abbott, Gary Fleet 07/21/2015,1:45 AM   Addendum: Patient in septic shock. CT noted for peritonitis vs. Abdominal abscess. Now adding anidulafungin for empiric therapy. Enter Vancomycin 1250 mg IV Q 12 hours for normalized CrCl ~ 30-35 mL/min. Increase Imipenem to 250 mg IV Q 6 hours for improved renal fx.   Vinnie Level, PharmD., BCPS Clinical Pharmacist Pager [phone]

## 2015-07-21 NOTE — Sedation Documentation (Signed)
500cc of yellow drainage from Right drain. 500cc brown drainage from Left drain

## 2015-07-21 NOTE — Procedures (Signed)
Central Venous Catheter Insertion Procedure Note Jonathan Le [id number] 10/01/1946  Procedure: Insertion of Central Venous Catheter Indications: Assessment of intravascular volume, Drug and/or fluid administration and Frequent blood sampling  Procedure Details Consent: Risks of procedure as well as the alternatives and risks of each were explained to the (patient/caregiver).  Consent for procedure obtained. Time Out: Verified patient identification, verified procedure, site/side was marked, verified correct patient position, special equipment/implants available, medications/allergies/relevent history reviewed, required imaging and test results available.  Performed  Maximum sterile technique was used including antiseptics, cap, gloves, gown, hand hygiene, mask and sheet. Skin prep: Chlorhexidine; local anesthetic administered A antimicrobial bonded/coated triple lumen catheter was placed in the left internal jugular vein using the Seldinger technique.  Evaluation Blood flow good Complications: No apparent complications Patient did tolerate procedure well. Chest X-ray ordered to verify placement.  CXR: pending.  Jonathan Le 07/21/2015, 10:34 AM  Jonathan  Jonathan Le. Jonathan Alias, MD, FACP Pgr: [phone] [facility]

## 2015-07-21 NOTE — Progress Notes (Signed)
Utilization review completed. Lillis Nuttle, RN, BSN. 

## 2015-07-21 NOTE — Progress Notes (Signed)
RT transported pt to CT and back to 2M07.  Pt's vitals remained stable throughout.

## 2015-07-21 NOTE — Sedation Documentation (Signed)
Pt on ventilator and unresponsive, pt receiving continuous IV sedation of Versed , Fentanyl 100 mcg, Levophed 50 mcg, Epi 

## 2015-07-22 ENCOUNTER — Inpatient Hospital Stay (HOSPITAL_COMMUNITY): Payer: Medicare Other

## 2015-07-22 ENCOUNTER — Encounter (HOSPITAL_COMMUNITY): Payer: Self-pay | Admitting: *Deleted

## 2015-07-22 DIAGNOSIS — Z4659 Encounter for fitting and adjustment of other gastrointestinal appliance and device: Secondary | ICD-10-CM | POA: Insufficient documentation

## 2015-07-22 LAB — POCT I-STAT 3, ART BLOOD GAS (G3+)
ACID-BASE DEFICIT: 18 mmol/L — AB (ref 0.0–2.0)
Acid-base deficit: 11 mmol/L — ABNORMAL HIGH (ref 0.0–2.0)
BICARBONATE: 9.1 meq/L — AB (ref 20.0–24.0)
Bicarbonate: 13.8 mEq/L — ABNORMAL LOW (ref 20.0–24.0)
O2 SAT: 97 %
O2 SAT: 99 %
PH ART: 7.166 — AB (ref 7.350–7.450)
TCO2: 10 mmol/L (ref 0–100)
TCO2: 15 mmol/L (ref 0–100)
pCO2 arterial: 25.1 mmHg — ABNORMAL LOW (ref 35.0–45.0)
pCO2 arterial: 25.5 mmHg — ABNORMAL LOW (ref 35.0–45.0)
pH, Arterial: 7.34 — ABNORMAL LOW (ref 7.350–7.450)
pO2, Arterial: 118 mmHg — ABNORMAL HIGH (ref 80.0–100.0)
pO2, Arterial: 125 mmHg — ABNORMAL HIGH (ref 80.0–100.0)

## 2015-07-22 LAB — GLUCOSE, CAPILLARY
GLUCOSE-CAPILLARY: 172 mg/dL — AB (ref 65–99)
Glucose-Capillary: 161 mg/dL — ABNORMAL HIGH (ref 65–99)
Glucose-Capillary: 162 mg/dL — ABNORMAL HIGH (ref 65–99)
Glucose-Capillary: 164 mg/dL — ABNORMAL HIGH (ref 65–99)
Glucose-Capillary: 166 mg/dL — ABNORMAL HIGH (ref 65–99)
Glucose-Capillary: 184 mg/dL — ABNORMAL HIGH (ref 65–99)

## 2015-07-22 LAB — CBC
HCT: 37.5 % — ABNORMAL LOW (ref 39.0–52.0)
HEMATOCRIT: 31 % — AB (ref 39.0–52.0)
Hemoglobin: 10.9 g/dL — ABNORMAL LOW (ref 13.0–17.0)
Hemoglobin: 9.8 g/dL — ABNORMAL LOW (ref 13.0–17.0)
MCH: 26.5 pg (ref 26.0–34.0)
MCH: 27.3 pg (ref 26.0–34.0)
MCHC: 31.6 g/dL (ref 30.0–36.0)
MCHC: 29.1 g/dL — AB (ref 30.0–36.0)
MCV: 86.4 fL (ref 78.0–100.0)
MCV: 91 fL (ref 78.0–100.0)
PLATELETS: 236 10*3/uL (ref 150–400)
PLATELETS: 124 10*3/uL — AB (ref 150–400)
RBC: 3.59 MIL/uL — AB (ref 4.22–5.81)
RBC: 4.12 MIL/uL — ABNORMAL LOW (ref 4.22–5.81)
RDW: 17 % — AB (ref 11.5–15.5)
RDW: 16.7 % — AB (ref 11.5–15.5)
WBC: 30.3 10*3/uL — AB (ref 4.0–10.5)
WBC: 19.3 10*3/uL — AB (ref 4.0–10.5)

## 2015-07-22 LAB — BASIC METABOLIC PANEL
ANION GAP: 8 (ref 5–15)
Anion gap: 14 (ref 5–15)
BUN: 75 mg/dL — ABNORMAL HIGH (ref 6–20)
BUN: 73 mg/dL — AB (ref 6–20)
CALCIUM: 7.3 mg/dL — AB (ref 8.9–10.3)
CO2: 8 mmol/L — ABNORMAL LOW (ref 22–32)
CO2: 13 mmol/L — ABNORMAL LOW (ref 22–32)
CREATININE: 2.31 mg/dL — AB (ref 0.61–1.24)
Calcium: 7.8 mg/dL — ABNORMAL LOW (ref 8.9–10.3)
Chloride: 123 mmol/L — ABNORMAL HIGH (ref 101–111)
Chloride: 123 mmol/L — ABNORMAL HIGH (ref 101–111)
Creatinine, Ser: 1.88 mg/dL — ABNORMAL HIGH (ref 0.61–1.24)
GFR calc Af Amer: 31 mL/min — ABNORMAL LOW (ref >60)
GFR, EST AFRICAN AMERICAN: 40 mL/min — AB (ref >60)
GFR, EST NON AFRICAN AMERICAN: 27 mL/min — AB (ref >60)
GFR, EST NON AFRICAN AMERICAN: 35 mL/min — AB (ref >60)
Glucose, Bld: 185 mg/dL — ABNORMAL HIGH (ref 65–99)
Glucose, Bld: 195 mg/dL — ABNORMAL HIGH (ref 65–99)
POTASSIUM: 3.8 mmol/L (ref 3.5–5.1)
Potassium: 5.3 mmol/L — ABNORMAL HIGH (ref 3.5–5.1)
SODIUM: 145 mmol/L (ref 135–145)
Sodium: 144 mmol/L (ref 135–145)

## 2015-07-22 LAB — CBC WITH DIFFERENTIAL/PLATELET
BASOS ABS: 0 10*3/uL (ref 0.0–0.1)
BASOS PCT: 0 % (ref 0–1)
EOS ABS: 0 10*3/uL (ref 0.0–0.7)
Eosinophils Relative: 0 % (ref 0–5)
HCT: 35.6 % — ABNORMAL LOW (ref 39.0–52.0)
HEMOGLOBIN: 10.7 g/dL — AB (ref 13.0–17.0)
LYMPHS PCT: 8 % — AB (ref 12–46)
Lymphs Abs: 1.7 10*3/uL (ref 0.7–4.0)
MCH: 26.5 pg (ref 26.0–34.0)
MCHC: 30.1 g/dL (ref 30.0–36.0)
MCV: 88.1 fL (ref 78.0–100.0)
MONO ABS: 0.6 10*3/uL (ref 0.1–1.0)
Monocytes Relative: 3 % (ref 3–12)
NEUTROS PCT: 89 % — AB (ref 43–77)
Neutro Abs: 18.4 10*3/uL — ABNORMAL HIGH (ref 1.7–7.7)
PLATELETS: 161 10*3/uL (ref 150–400)
RBC: 4.04 MIL/uL — ABNORMAL LOW (ref 4.22–5.81)
RDW: 16.8 % — ABNORMAL HIGH (ref 11.5–15.5)
WBC: 20.7 10*3/uL — ABNORMAL HIGH (ref 4.0–10.5)

## 2015-07-22 LAB — COMPREHENSIVE METABOLIC PANEL
ALK PHOS: 151 U/L — AB (ref 38–126)
ALT: 19 U/L (ref 17–63)
ANION GAP: 10 (ref 5–15)
AST: 33 U/L (ref 15–41)
Albumin: 1 g/dL — ABNORMAL LOW (ref 3.5–5.0)
BILIRUBIN TOTAL: 1 mg/dL (ref 0.3–1.2)
BUN: 75 mg/dL — ABNORMAL HIGH (ref 6–20)
CALCIUM: 7.7 mg/dL — AB (ref 8.9–10.3)
CO2: 12 mmol/L — AB (ref 22–32)
CREATININE: 2.12 mg/dL — AB (ref 0.61–1.24)
Chloride: 123 mmol/L — ABNORMAL HIGH (ref 101–111)
GFR, EST AFRICAN AMERICAN: 35 mL/min — AB (ref >60)
GFR, EST NON AFRICAN AMERICAN: 30 mL/min — AB (ref >60)
Glucose, Bld: 175 mg/dL — ABNORMAL HIGH (ref 65–99)
Potassium: 4.5 mmol/L (ref 3.5–5.1)
Sodium: 145 mmol/L (ref 135–145)
TOTAL PROTEIN: 4.9 g/dL — AB (ref 6.5–8.1)

## 2015-07-22 LAB — PROCALCITONIN: Procalcitonin: 8.66 ng/mL

## 2015-07-22 LAB — CALCIUM, IONIZED: CALCIUM, IONIZED, SERUM: 4.8 mg/dL (ref 4.5–5.6)

## 2015-07-22 MED ORDER — SODIUM BICARBONATE 8.4 % IV SOLN
100.0000 meq | Freq: Once | INTRAVENOUS | Status: AC
  Administered 2015-07-22: 100 meq via INTRAVENOUS
  Filled 2015-07-22: qty 100

## 2015-07-22 MED ORDER — SODIUM BICARBONATE 8.4 % IV SOLN
INTRAVENOUS | Status: DC
  Administered 2015-07-22 – 2015-07-24 (×5): via INTRAVENOUS
  Filled 2015-07-22 (×11): qty 150

## 2015-07-22 MED ORDER — VITAL HIGH PROTEIN PO LIQD
1000.0000 mL | ORAL | Status: DC
  Administered 2015-07-22: 1000 mL
  Filled 2015-07-22 (×5): qty 1000

## 2015-07-22 MED ORDER — MIDAZOLAM HCL 5 MG/ML IJ SOLN
0.0000 mg/h | INTRAMUSCULAR | Status: DC
  Administered 2015-07-23: 2 mg/h via INTRAVENOUS
  Filled 2015-07-22: qty 10

## 2015-07-22 MED ORDER — SODIUM CHLORIDE 0.9 % IV BOLUS (SEPSIS)
750.0000 mL | Freq: Once | INTRAVENOUS | Status: AC
  Administered 2015-07-22: 750 mL via INTRAVENOUS

## 2015-07-22 NOTE — Progress Notes (Signed)
Subjective: On vent  Objective: Vital signs in last 24 hours: Temp:  [97.4 F (36.3 C)-99.4 F (37.4 C)] 97.4 F (36.3 C) (09/07 0807) Pulse Rate:  [30-124] 75 (09/07 0801) Resp:  [28-36] 35 (09/07 0801) BP: (68-145)/(35-76) 145/60 mmHg (09/07 0801) SpO2:  [95 %-100 %] 100 % (09/07 0801) Arterial Line BP: (69-152)/(24-67) 138/55 mmHg (09/07 0700) FiO2 (%):  [40 %-100 %] 40 % (09/07 0801) Weight:  [123.7 kg (272 lb 11.3 oz)] 123.7 kg (272 lb 11.3 oz) (09/07 0345) Last BM Date: 07/21/15  Intake/Output from previous day: 09/06 0701 - 09/07 0700 In: 6495 [I.V.:5075; NG/GT:90; IV Piggyback:1310] Out: 2765 [Urine:365; Drains:2400] Intake/Output this shift:    General appearance: toxic Resp: few rhonchi GI: soft, L drain red pus, right drain more serous Stoma with some liquid stool  Lab Results:   Recent Labs  07/22/15 0001 07/22/15 0445  WBC 30.3* 20.7*  HGB 10.9* 10.7*  HCT 37.5* 35.6*  PLT 236 161   BMET  Recent Labs  07/22/15 0001 07/22/15 0445  NA 145 145  K 5.3* 4.5  CL 123* 123*  CO2 8* 12*  GLUCOSE 185* 175*  BUN 73* 75*  CREATININE 2.31* 2.12*  CALCIUM 7.8* 7.7*   PT/INR  Recent Labs  07/21/15 0815 07/21/15 1740  LABPROT 19.5* 22.2*  INR 1.65* 1.95*   ABG  Recent Labs  07/21/15 1126 07/22/15 0355  PHART 7.259* 7.166*  HCO3 10.8* 9.1*    Studies/Results: Ct Abdomen Pelvis Wo Contrast  07/21/2015   CLINICAL DATA:  69 year old male with history of prior colectomy for ischemic colitis presenting with septic shock and acute respiratory failure.  EXAM: CT ABDOMEN AND PELVIS WITHOUT CONTRAST  TECHNIQUE: Multidetector CT imaging of the abdomen and pelvis was performed following the standard protocol without IV contrast.  COMPARISON:  CT dated 06/26/2015  FINDINGS: Evaluation of this exam is limited in the absence of intravenous contrast. Evaluation is also limited due to streak artifact caused by patient's arms.  Partially visualized small  bilateral pleural effusions with subsegmental consolidative changes of the lung bases compatible with atelectasis versus pneumonia. There is coronary vascular calcification and CABG clips.  No intra-abdominal free air.  No free fluid identified.  The liver is grossly unremarkable. Layering high attenuating material within the gallbladder may represent sludge or small stones versus vicariously excreted contrast material from prior study. There is no pericholecystic fluid. The pancreas, spleen, adrenal glands appear unremarkable. There are small nonobstructing bilateral renal calculi measuring up to 5 mm. There is no hydronephrosis on either side. The visualized ureters appear unremarkable. The urinary bladder is decompressed around a Foley catheter. A 4 mm calcific density along the right bladder wall is similar to the prior study. Enlarged prostate gland measuring up to 6.5 cm in diameter.  Evaluation of the bowel is very limited in the absence of oral contrast. 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 There is fluid distention of the rectal pouch which has increased in size and more distended compared to the prior study. There is haziness of the fat surrounding the upper border of the rectal pouch with small pockets of air concerning for sutural dehiscence. Multiple decompressed loops of small bowel noted within the abdomen and pelvis. A 15 x 12 cm loculated fluid collection noted in the left hemipelvis containing air and fluid. There has been interval increase in the size of this collection with significant increase in the amount of gas within this collection concerning for developing infection.  An ill-defined apparent track like structure may be present extending from this collection to the rectal pouch concerning for developing fistula. There is extension of the gas into the left anterior abdominal musculature adjacent and superior to the loculated peritoneal collection. A 17 x 9 cm loculated  fluid collection is noted in the right anterior peritoneum superior to the ileostomy.  Mild aortoiliac atherosclerotic disease. There is no lymphadenopathy. There is a small fat containing left inguinal hernia. Midline vertical anterior abdominal wall incisional scar. Degenerative changes of the spine. No acute fracture.  IMPRESSION: Postsurgical changes of colectomy and right lower quadrant ileostomy. No evidence of bowel obstruction. There has been interval increase in the size of fluid field rectal pouch. Small pockets of air superior to the pouch are concerning for suture dehiscence. Clinical correlation and surgical consult is advised.  Increase in the size of the left hemipelvic peritoneal collection with significant interval increase in the size of the gas within this collection concerning for superimposed infection. An ill-defined track like structure extending from this collection to the rectal pouch is concerning for developing fistula. There is extension of gas in the left anterior abdominal musculature superior to this collection.  Loculated peritoneal fluid collection in the right anterior hemiabdomen superior to the ileostomy.  Small bilateral pleural effusions with bibasilar subsegmental atelectasis/pneumonia.  These results were called by telephone at the time of interpretation on 07/21/2015 at 3:52 am to the charge nurse, Christell Constant, who verbally acknowledged these results.   Electronically Signed   By: Elgie Collard M.D.   On: 07/21/2015 03:55   US Renal Port  07/20/2015   CLINICAL DATA:  Renal failure. Patient on vent. CT congestive heart failure.  EXAM: RENAL / URINARY TRACT ULTRASOUND COMPLETE  COMPARISON:  CT 812 1,016  FINDINGS: Right Kidney:  Length: 11.7 cm.  Normal cortical thickness.  No hydronephrosis.  Left Kidney:  Length: 10.8 cm.  Normal cortical thickness.  No hydronephrosis.  Bladder:  Appears normal for degree of bladder distention.  There several fluid collections within the  peritoneal space. These collections have lacy internal echoes consistent with a complex fluid collections. The largest in LEFT upper quadrant and aright which enters 19 x 10 x 17 cm.  IMPRESSION: 1. No hydronephrosis. 2. Multiple complex fluid collections within the peritoneal space. There was fluid within the peritoneal space on comparison CT however the collection appear larger and have internal complexity suggesting peritonitis or abscess. Recommend CT of the abdomen and pelvis with contrast (if possible) for further evaluation. Findings conveyed tocharge nurse Brie on 07/20/2015 at21:44. Attempting to contact attending physician.   Electronically Signed   By: Genevive Bi M.D.   On: 07/20/2015 21:49   Dg Chest Port 1 View  07/22/2015   CLINICAL DATA:  Septic shock, respiratory failure, intubated patient.  EXAM: PORTABLE CHEST - 1 VIEW  COMPARISON:  Portable chest x-ray of July 21, 2015  FINDINGS: The lungs are adequately inflated. Increased bibasilar atelectasis is present. The cardiac silhouette remains enlarged. The central pulmonary vascularity is mildly prominent. The endotracheal tube tip lies 4.4 cm above the carina. The left internal jugular venous catheter tip projects at junction of the right and left brachiocephalic veins. The right internal jugular catheter is been removed. The esophagogastric tube tip projects below the inferior margin of the image.  IMPRESSION: Worsening bibasilar atelectasis. The support tubes are in reasonable position.   Electronically Signed   By: David  [place] M.D.   On: 07/22/2015 07:02  Dg Chest Port 1 View  07/21/2015   CLINICAL DATA:  Central catheter placement  EXAM: PORTABLE CHEST - 1 VIEW  COMPARISON:  Study obtained earlier in the day  FINDINGS: New central catheter is present with tip in the superior vena cava. A second central catheter from the right side extends into the right neck region, stable compared to earlier in the day. Endotracheal tube tip is 3.4  cm above the carina. Nasogastric tube tip and side port are in the region of the distal stomach. No pneumothorax. There is patchy bibasilar atelectasis, slightly less than earlier today. Lungs elsewhere clear. Heart is upper normal in size with pulmonary vascularity within normal limits. There is no adenopathy. Patient is status post coronary artery bypass grafting.  IMPRESSION: Tube and catheter positions as described without pneumothorax. Note that the right central catheter extends into the right neck region with the tip not seen.  There is patchy bibasilar atelectasis, slightly less compared to earlier in the day. No change in cardiac silhouette.   Electronically Signed   By: Bretta Bang III M.D.   On: 07/21/2015 11:31   Dg Chest Port 1 View  07/21/2015   CLINICAL DATA:  Respiratory failure, congestive heart failure portable chest x-ray of 07/20/2015  EXAM: PORTABLE CHEST - 1 VIEW  COMPARISON:  None.  FINDINGS: The tip of the endotracheal tube is approximately 4.4 cm above the carina. There has been slight worsening of bibasilar in linear atelectasis. Mild cardiomegaly is stable. The right central venous line extends cephalad, probably within the right internal jugular vein, unchanged. The tip is not visualized.  IMPRESSION: 1. Diminished aeration with increasing bibasilar atelectasis. 2. Tip of endotracheal tube approximately 4.4 cm above the carina. 3. As before, the right IJ central venous line extends into the neck.   Electronically Signed   By: Dwyane Dee M.D.   On: 07/21/2015 08:13   Dg Chest Port 1 View  07/20/2015   CLINICAL DATA:  Endotracheal tube and NG tube placement  EXAM: PORTABLE CHEST - 1 VIEW  COMPARISON:  07/19/2015  FINDINGS: There is endotracheal tube with the tip at the right mainstem bronchus; recommend retracting the endotracheal tube 2.5 cm.  There is mild bilateral interstitial prominence. There is left basilar linear airspace disease likely reflecting atelectasis. There is no  pleural effusion or pneumothorax. There is stable mild cardiomegaly. There is evidence of prior CABG.  The osseous structures are unremarkable.  IMPRESSION: 1. Endotracheal tube with the tip at the right mainstem bronchus; recommend retracting the endotracheal tube 2.5 cm.   Electronically Signed   By: Elige Ko   On: 07/20/2015 18:57   Dg Abd Portable 1v  07/21/2015   CLINICAL DATA:  Orogastric tube placement  EXAM: PORTABLE ABDOMEN - 1 VIEW  COMPARISON:  None.  FINDINGS: The orogastric tube is not well seen. There is a tube along the superior aspect of the image with what appears to be a side-port slightly to the right of midline suggesting that the orogastric tube tip and side port are in the distal stomach. Bowel gas pattern unremarkable. There is degenerative change in the lumbar spine.  IMPRESSION: Orogastric tube less than optimally seen. Suspect tube tip and side port in distal stomach, although a repeat study centered over the upper abdomen is advised to confirm this observation. Bowel gas pattern which is visualized appears unremarkable.   Electronically Signed   By: Bretta Bang III M.D.   On: 07/21/2015 08:05   Dg  Abd Portable 1v  07/20/2015   CLINICAL DATA:  Nasogastric tube placement.  EXAM: PORTABLE ABDOMEN - 1 VIEW  COMPARISON:  07/03/2015  FINDINGS: Nasogastric tube is not visible despite the history. Endotracheal tube is in place, tip approximately 2 cm above carina. There are coarse markings in the lungs.  IMPRESSION: No enteric tube identified.   Electronically Signed   By: Norva Pavlov M.D.   On: 07/20/2015 18:15   Ct Image Guided Drainage By Percutaneous Catheter  07/21/2015   CLINICAL DATA:  Right and left abdominal abscess is  EXAM: CT IMAGE GUIDED DRAINAGE BY PERCUTANEOUS CATHETER  FLUOROSCOPY TIME:  None  MEDICATIONS AND MEDICAL HISTORY: Patient is intubated and sedated  ANESTHESIA/SEDATION: Not applicable  CONTRAST:  None  PROCEDURE: The procedure, risks, benefits, and  alternatives were explained to the patient. Questions regarding the procedure were encouraged and answered. The patient understands and consents to the procedure.  The left abdomen was prepped with Betadine in a sterile fashion, and a sterile drape was applied covering the operative field. A sterile gown and sterile gloves were used for the procedure.  Under CT guidance, an 18 gauge needle was inserted into the left abdominal fluid collection. After aspirating serosanguineous fluid, the needle was removed over an Amplatz wire. A 12 French dilator followed by a 12 [place] drain was inserted. The drain was looped and string fixed in the fluid collection. It was sewn to the skin then attached to a gravity drainage bag.  The identical procedure was performed in the right abdomen for the right abdominal fluid collection. This yielded cloudy yellowish fluid.  FINDINGS: Images demonstrate 65 French drain placement into right and left abdominal fluid collections as described.  COMPLICATIONS: None  IMPRESSION: Bilateral abdominal abscess strain as described above yielding serosanguineous fluid from the left abdominal fluid collection and cloudy yellow fluid on the right. Bilateral 12 French drains were utilized.   Electronically Signed   By: Jolaine Click M.D.   On: 07/21/2015 15:06   Ct Image Guided Drainage By Percutaneous Catheter  07/21/2015   CLINICAL DATA:  Right and left abdominal abscess is  EXAM: CT IMAGE GUIDED DRAINAGE BY PERCUTANEOUS CATHETER  FLUOROSCOPY TIME:  None  MEDICATIONS AND MEDICAL HISTORY: Patient is intubated and sedated  ANESTHESIA/SEDATION: Not applicable  CONTRAST:  None  PROCEDURE: The procedure, risks, benefits, and alternatives were explained to the patient. Questions regarding the procedure were encouraged and answered. The patient understands and consents to the procedure.  The left abdomen was prepped with Betadine in a sterile fashion, and a sterile drape was applied covering the operative  field. A sterile gown and sterile gloves were used for the procedure.  Under CT guidance, an 18 gauge needle was inserted into the left abdominal fluid collection. After aspirating serosanguineous fluid, the needle was removed over an Amplatz wire. A 12 French dilator followed by a 12 [place] drain was inserted. The drain was looped and string fixed in the fluid collection. It was sewn to the skin then attached to a gravity drainage bag.  The identical procedure was performed in the right abdomen for the right abdominal fluid collection. This yielded cloudy yellowish fluid.  FINDINGS: Images demonstrate 79 French drain placement into right and left abdominal fluid collections as described.  COMPLICATIONS: None  IMPRESSION: Bilateral abdominal abscess strain as described above yielding serosanguineous fluid from the left abdominal fluid collection and cloudy yellow fluid on the right. Bilateral 12 French drains were utilized.   Electronically Signed  By: Jolaine Click M.D.   On: 07/21/2015 15:06    Anti-infectives: Anti-infectives    Start     Dose/Rate Route Frequency Ordered Stop   07/22/15 1000  anidulafungin (ERAXIS) 100 mg in sodium chloride 0.9 % 100 mL IVPB     100 mg over 90 Minutes Intravenous Every 24 hours 07/21/15 0959     07/21/15 1800  imipenem-cilastatin (PRIMAXIN) 250 mg in sodium chloride 0.9 % 100 mL IVPB     250 mg 200 mL/hr over 30 Minutes Intravenous 4 times per day 07/21/15 1401     07/21/15 1200  imipenem-cilastatin (PRIMAXIN) 250 mg in sodium chloride 0.9 % 100 mL IVPB  Status:  Discontinued     250 mg 200 mL/hr over 30 Minutes Intravenous Every 12 hours 07/21/15 0200 07/21/15 1401   07/21/15 1100  vancomycin (VANCOCIN) 1,250 mg in sodium chloride 0.9 % 250 mL IVPB     1,250 mg 166.7 mL/hr over 90 Minutes Intravenous Every 24 hours 07/21/15 1006     07/21/15 1000  anidulafungin (ERAXIS) 200 mg in sodium chloride 0.9 % 200 mL IVPB     200 mg over 180 Minutes Intravenous  Once  07/21/15 0959 07/21/15 1825   07/21/15 0230  vancomycin (VANCOCIN) IVPB 1000 mg/200 mL premix     1,000 mg 200 mL/hr over 60 Minutes Intravenous  Once 07/21/15 0200 07/21/15 0334   07/21/15 0230  imipenem-cilastatin (PRIMAXIN) 500 mg in sodium chloride 0.9 % 100 mL IVPB     500 mg 200 mL/hr over 30 Minutes Intravenous  Once 07/21/15 0200 07/21/15 0303      Assessment/Plan: 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 Tyson Alias  LOS: 1 day    Violeta Gelinas E 07/22/2015

## 2015-07-22 NOTE — Progress Notes (Addendum)
Initial Nutrition Assessment  DOCUMENTATION CODES:   Morbid obesity  INTERVENTION:    Initiate TF via OGT with Vital High Protein at 10 ml/h (trophic rate for now).  When able to advance TF, recommend goal rate of 50 ml/h (1200 ml per day) with Prostat 30 ml 5 times per day, to provide 1700 kcals, 180 gm protein, 1003 ml free water daily.  NUTRITION DIAGNOSIS:   Inadequate oral intake related to inability to eat as evidenced by NPO status.  GOAL:   Provide needs based on ASPEN/SCCM guidelines  MONITOR:   TF tolerance, Vent status, Labs, Weight trends, Skin  REASON FOR ASSESSMENT:   Consult Enteral/tube feeding initiation and management  ASSESSMENT:   Patient with recent hospitalization S/P CABG; required colectomy, ex lap, ileostomy, eventual trach; transferred to [facility] on 8/24.  Admitted to [facility] on 9/6 with septic shock secondary to ischemic colon, acute respiratiory failure, and acute renal failure.  Patient is currently intubated on ventilator support Temp (24hrs), Avg:98.1 F (36.7 C), Min:97.4 F (36.3 C), Max:99.4 F (37.4 C)   Discussed patient in ICU rounds and with RN today. Plans to start trickle TF via OGT today with Vital High Protein at 10 ml/h (240 kcals, 21 gm protein, 201 ml free water daily).  Diet Order:  Diet NPO time specified  Skin:  Wound (see comment) (stg 3 to buttocks & rt coccyx; DTI to nose; VAC to abd )  Last BM:  9/6  Height:   Ht Readings from Last 1 Encounters:  06/30/15  (1.753 m)    Weight:   Wt Readings from Last 1 Encounters:  07/22/15 272 lb 11.3 oz (123.7 kg)    Ideal Body Weight:  72.7 kg  BMI:  Body mass index is 40.25 kg/(m^2).  Estimated Nutritional Needs:   Kcal:  4098-1191  Protein:  180 gm  Fluid:  2-2.5 L  EDUCATION NEEDS:   No education needs identified at this time  Joaquin Courts, RD, LDN, CNSC Pager [phone] After Hours Pager [phone]

## 2015-07-22 NOTE — Progress Notes (Signed)
eLink Physician-Brief Progress Note Patient Name: Jonathan Le DOB: 1946-08-08 MRN: [id number]   Date of Service  07/22/2015  HPI/Events of Note  Hypothermia - Temp = 94.3 F.   eICU Interventions  Will order [employer].      Intervention Category Intermediate Interventions: Other:  Sommer,Steven Dennard Nip 07/22/2015, 8:01 PM

## 2015-07-22 NOTE — Consult Note (Signed)
PULMONARY / CRITICAL CARE MEDICINE   Name: Jonathan Le MRN: [id number] DOB: [birth year]    ADMISSION DATE:  07/21/2015 CONSULTATION DATE: 06/18/15   CHIEF COMPLAINT:  Septic shock secondary to ischemic colon. Acute respiratiory failure. Acute renal failure.  INITIAL PRESENTATION: 69 year old male with PMH of OSA and HTN. 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 Post discharge course complicated by R leg cellulitis after endoscopic vein harvest he was seen by Dr. Prescott Gum in clinic for this,was having some constipation. 8/4 PM he took an enema and was straining in bathroom. He became diaphoretic and had loss of consciousness. In ED he was noted to be profoundly hypotensive with SOB. Found to be in severe septic shock with presumed ischemic colon.  He was then taken to the OR emergently by Dr. Donne Hazel and had abdominal colectomy 8/4.  Returned to OR 8/6 for abdominal closure with ileostomy. Post operatively, he self extubated on 8/8.   He required re-intubation and on 8/19, he subsequently had tracheostomy performed by Dr. Constance Holster of ENT. On 8/24, he was discharged to [facility].  While on [facility], he self de-cannulated at some point and on 9/5, he had a suspected aspiration event and developed respiratory failure. PCCM was consulted and pt was intubated.  Later that night, he had ongoing hypotension despite increasing dose of levophed.  Renal US earlier in the day revealed findings concerning for peritonitis vs abscess.  Pt was transferred to [facility] ICU and CCS was consulted for further recs.   STUDIES:  Echo 8/4 >>> EF 25% to 30%. Severe hypokinesis of the anteroseptal and anterior myocardium.Dyskinesis of the apical myocardium. CT chest / abd pelv 8/4 >>>mod to severe cardiomegaly with small pericardial effusion.  Pulmonary edema  with small pleural effusions.  Focal colonic hepatic flexure pneumatosis without perforation.  Bilateral nonobstructing nephrolithiasis. CT A / P 8/12 >>> no evidence abscess.  Ascites present, GB sludge, small b/l pleural effusions. Renal US 9/5 >>>  No hydronephrosis, multiple complex fluid collections within the peritoneal space.  Fluid spaces larger with internal complexity suggesting peritonitis or abscess. CT A / P 9/5 >>>large abscess abdo   SIGNIFICANT EVENTS: 8/4 Total abdominal colectomy with abdominal vac placement 8/6 Exploratory laparotomy, removal of prior packs, closure of open abdomen, ileostomy 8/7 high pressors 8/8 self extubated 8/9  resp distress , required bipap , lasix 8/13 had to restart levophed for shock overnight. 8/16 reintubated, primarily for MS, agitation, tachypnea 8/19 Trach placed 8/22 tolerating TC. 8/23 to [facility] 9/5 PCCM called back for aspiration PNA and respiratory failure.  Pt reintubated.  Later transferred to [facility] ICU for worsening septic shock. 9/6 - emergent drainage abscess IR, refractory shock  SUBJECTIVE:  Pressors reduced  VITAL SIGNS:BP 103/62 mmHg  Pulse 74  Temp(Src) 97.5 F (36.4 C) (Axillary)  Resp 35  Wt 123.7 kg (272 lb 11.3 oz)  SpO2 100%   VENTILATOR SETTINGS: Vent Mode:  [-] PRVC FiO2 (%):  [40 %-100 %] 40 % Set Rate:  [35 bmp] 35 bmp Vt Set:  [560 mL] 560 mL PEEP:  [5 cmH20] 5 cmH20 Plateau Pressure:  [15 cmH20-22 cmH20] 15 cmH20PRVC  INTAKE / OUTPUT:  Intake/Output Summary (Last 24 hours) at 07/22/15 1124 Last data filed at 07/22/15 0800  Gross per 24 hour  Intake 6035.94 ml  Output   2615 ml  Net 3420.94 ml    PHYSICAL EXAMINATION: General: Adult male, on vent Neuro: Not following commands., rass -5 paralyzed Head: [place]/AT, no neck line clean EENT: PERRL Cardiovascular: RRR, no M/R/G. Lungs: CTA anterior Abdomen: Soft, non-tender, surgical dressing with vac in place, ileostomy in place. Drains with foul  smelling output Ext: 3+ edema  LABS:  CBC  Recent Labs Lab 07/21/15 1740 07/22/15 0001 07/22/15 0445  WBC 31.5* 30.3* 20.7*  HGB 10.9* 10.9* 10.7*  HCT 36.8* 37.5* 35.6*  PLT 254 236 161   Coag's  Recent Labs Lab 07/21/15 0815 07/21/15 1740  APTT 29  --   INR 1.65* 1.95*   BMET  Recent Labs Lab 07/21/15 1740 07/22/15 0001 07/22/15 0445  NA 146* 145 145  K 4.7 5.3* 4.5  CL 125* 123* 123*  CO2 9* 8* 12*  BUN 71* 73* 75*  CREATININE 2.31* 2.31* 2.12*  GLUCOSE 164* 185* 175*   Electrolytes  Recent Labs Lab 07/21/15 0405  07/21/15 1740 07/22/15 0001 07/22/15 0445  CALCIUM 8.2*  < > 7.8* 7.8* 7.7*  MG 2.1  --   --   --   --   PHOS 4.9*  --   --   --   --   < > = values in this interval not displayed. Sepsis Markers  Recent Labs Lab 07/20/15 0857 07/21/15 0404 07/21/15 0405 07/22/15 0445  LATICACIDVEN  --  4.3*  --   --   PROCALCITON 2.82  --  4.40 8.66   ABG  Recent Labs Lab 07/21/15 0334 07/21/15 1126 07/22/15 0355  PHART 7.308* 7.259* 7.166*  PCO2ART 23.9* 23.9* 25.1*  PO2ART 98.3 89.0 118.0*   Liver Enzymes  Recent Labs Lab 07/21/15 0405 07/22/15 0445  AST 27 33  ALT 22 19  ALKPHOS 136* 151*  BILITOT 0.9 1.0  ALBUMIN 1.2* 1.0*   Cardiac Enzymes  Recent Labs Lab 07/21/15 0405  TROPONINI 0.06*   Glucose  Recent Labs Lab 07/21/15 0742 07/21/15 1535 07/21/15 1959 07/21/15 2347 07/22/15 0405 07/22/15 0805  GLUCAP 101* 115* 181* 162* 164* 166*   Imaging Dg Chest Port 1 View  07/22/2015   CLINICAL DATA:  Septic shock, respiratory failure, intubated patient.  EXAM: PORTABLE CHEST - 1 VIEW  COMPARISON:  Portable chest x-ray of July 21, 2015  FINDINGS: The lungs are adequately inflated. Increased bibasilar atelectasis is present. The cardiac silhouette remains enlarged. The central pulmonary vascularity is mildly prominent. The endotracheal tube tip lies 4.4 cm above the carina. The left internal jugular venous catheter  tip projects at junction of the right and left brachiocephalic veins. The right internal jugular catheter is been removed. The esophagogastric tube tip projects below the inferior margin of the image.  IMPRESSION: Worsening bibasilar atelectasis. The support tubes are in reasonable position.   Electronically Signed   By: David  Martinique M.D.   On: 07/22/2015 07:02   Dg Chest Port 1 View  07/21/2015   CLINICAL DATA:  Central catheter placement  EXAM: PORTABLE CHEST - 1 VIEW  COMPARISON:  Study obtained earlier in the day  FINDINGS: New central catheter is present with tip in the superior vena cava. A second central catheter from the right side extends into the right neck region, stable compared to earlier in the day. Endotracheal tube tip is 3.4 cm above the carina. Nasogastric tube tip and side port are in the region of the distal stomach. No pneumothorax. There is patchy bibasilar atelectasis,  slightly less than earlier today. Lungs elsewhere clear. Heart is upper normal in size with pulmonary vascularity within normal limits. There is no adenopathy. Patient is status post coronary artery bypass grafting.  IMPRESSION: Tube and catheter positions as described without pneumothorax. Note that the right central catheter extends into the right neck region with the tip not seen.  There is patchy bibasilar atelectasis, slightly less compared to earlier in the day. No change in cardiac silhouette.   Electronically Signed   By: Lowella Grip III M.D.   On: 07/21/2015 11:31   Ct Image Guided Drainage By Percutaneous Catheter  07/21/2015   CLINICAL DATA:  Right and left abdominal abscess is  EXAM: CT IMAGE GUIDED DRAINAGE BY PERCUTANEOUS CATHETER  FLUOROSCOPY TIME:  None  MEDICATIONS AND MEDICAL HISTORY: Patient is intubated and sedated  ANESTHESIA/SEDATION: Not applicable  CONTRAST:  None  PROCEDURE: The procedure, risks, benefits, and alternatives were explained to the patient. Questions regarding the procedure were  encouraged and answered. The patient understands and consents to the procedure.  The left abdomen was prepped with Betadine in a sterile fashion, and a sterile drape was applied covering the operative field. A sterile gown and sterile gloves were used for the procedure.  Under CT guidance, an 18 gauge needle was inserted into the left abdominal fluid collection. After aspirating serosanguineous fluid, the needle was removed over an Amplatz wire. A 12 French dilator followed by a 12 [place] drain was inserted. The drain was looped and string fixed in the fluid collection. It was sewn to the skin then attached to a gravity drainage bag.  The identical procedure was performed in the right abdomen for the right abdominal fluid collection. This yielded cloudy yellowish fluid.  FINDINGS: Images demonstrate 48 French drain placement into right and left abdominal fluid collections as described.  COMPLICATIONS: None  IMPRESSION: Bilateral abdominal abscess strain as described above yielding serosanguineous fluid from the left abdominal fluid collection and cloudy yellow fluid on the right. Bilateral 12 French drains were utilized.   Electronically Signed   By: Marybelle Killings M.D.   On: 07/21/2015 15:06   Ct Image Guided Drainage By Percutaneous Catheter  07/21/2015   CLINICAL DATA:  Right and left abdominal abscess is  EXAM: CT IMAGE GUIDED DRAINAGE BY PERCUTANEOUS CATHETER  FLUOROSCOPY TIME:  None  MEDICATIONS AND MEDICAL HISTORY: Patient is intubated and sedated  ANESTHESIA/SEDATION: Not applicable  CONTRAST:  None  PROCEDURE: The procedure, risks, benefits, and alternatives were explained to the patient. Questions regarding the procedure were encouraged and answered. The patient understands and consents to the procedure.  The left abdomen was prepped with Betadine in a sterile fashion, and a sterile drape was applied covering the operative field. A sterile gown and sterile gloves were used for the procedure.  Under CT  guidance, an 18 gauge needle was inserted into the left abdominal fluid collection. After aspirating serosanguineous fluid, the needle was removed over an Amplatz wire. A 12 French dilator followed by a 12 [place] drain was inserted. The drain was looped and string fixed in the fluid collection. It was sewn to the skin then attached to a gravity drainage bag.  The identical procedure was performed in the right abdomen for the right abdominal fluid collection. This yielded cloudy yellowish fluid.  FINDINGS: Images demonstrate 75 French drain placement into right and left abdominal fluid collections as described.  COMPLICATIONS: None  IMPRESSION: Bilateral abdominal abscess strain as described above yielding serosanguineous fluid from the  left abdominal fluid collection and cloudy yellow fluid on the right. Bilateral 12 French drains were utilized.   Electronically Signed   By: Marybelle Killings M.D.   On: 07/21/2015 15:06    ASSESSMENT / PLAN:  PULMONARY ETT 8/4 >>8/8 ETT 8/16 >> 8/19 Trach 8/19 (Dr Constance Holster) >> self decannulated exact date unknown. ETT 9/5>>> A:  VDRF - reintubated 9/5 for aspiration and inability to protect airway. OSA. Aspiration PNA. ATx bases P:   Uncompensated met acidosis Keep rate 35, ensure TV to 8 cc/kg, repeat abg in 1 hr Permissive hypercapnia abg in am  CARDIOVASCULAR A:  Septic shock - intraabdominal abscess Ischemic cardiomyopathy - hx CABG on 7/16 Refractory shock mal placed picc removed 9/6 P:  Levophed 12, better Dc vaso  Epi off Goal ph greater 7.20 cvp 10 , bolus again Dc steroids, cort 60  RENAL A:   AKI - suspect pre-renal from low flow state / hypoperfusion. Mild hypernatremia. NAG metabolic acidosis. P:   Keep bicarb for NON AG acidosis abg  BMP in AM.  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 GI prophylaxis. Nutrition. P:   Protonix for SUP. Start Trickle T F, OK by  CCS  HEMATOLOGIC A:  Anemia - chronic. VTE prophylaxis. P:  Transfuse per usual ICU guidelines. SCD's / Heparin Limit phlebotomy dc q12h cbc  INFECTIOUS A:   Septic shock - abdominal abscess). R LE ulcer, chronic. P:   BCx 9/4 >>> Sputum 9/4 >>> Abscess IR 9/7>>> Abx: Vanc, Imipenem (penicillin allergy). aningulofungin 9/7>>>  Keep same regimen abx, await culture  ENDOCRINE A:   Hyperglycemia without hx of DM. P: SSI. No need roids, dc   NEUROLOGIC A:  Acute Metabolic encephalopathy. Pain, Anxiety. Generalized deconditioning. P:   Sedation: dc versed to prn by am, keep and reduce fent, dc nimbex RASS goal: -3 Daily WUA. no  Family updates:  None.  Interdisciplinary meeting due by:  9/12.  Ccm time 35 min   Lavon Paganini. Titus Mould, MD, [place] Pgr: [facility]

## 2015-07-22 NOTE — Progress Notes (Signed)
eLink Physician-Brief Progress Note Patient Name: Jonathan Le DOB: 1946/07/21 MRN: [id number]   Date of Service  07/22/2015  HPI/Events of Note  Patient with metabolic acidosis with pH 7.16/25/118 on max vent settings.  eICU Interventions  Plan: 2 amps of bicarb IVP Bicarb gtt      Intervention Category Major Interventions: Acid-Base disturbance - evaluation and management  DETERDING,ELIZABETH 07/22/2015, 4:08 AM

## 2015-07-22 NOTE — Progress Notes (Signed)
Patient ID: Jonathan Le, male   DOB: 02/28/1946, 69 y.o.   MRN: [id number]    Referring Physician(s): CCS  Chief Complaint:  Abdominal abscesses, post colectomy  Subjective:  Pt sedated, on vent  Allergies: Levaquin; Penicillins; and Doxycycline  Medications: Prior to Admission medications   Medication Sig Start Date End Date Taking? Authorizing Provider  Amino Acids-Protein Hydrolys (FEEDING SUPPLEMENT, PRO-STAT SUGAR FREE 64,) LIQD Place 30 mLs into feeding tube 3 (three) times daily. 07/08/15  Yes Jeanella Craze, NP  aspirin EC 325 MG EC tablet Take 1 tablet (325 mg total) by mouth daily. 05/31/15  Yes Erin R Barrett, PA-C  enoxaparin (LOVENOX) 30 MG/0.3ML injection Inject 0.3 mLs (30 mg total) into the skin daily. 07/08/15  Yes Jeanella Craze, NP  finasteride (PROSCAR) 5 MG tablet Take 5 mg by mouth daily.   Yes Historical Provider, MD  furosemide (LASIX) 40 MG tablet Take 40 mg by mouth daily. 06/12/15  Yes Historical Provider, MD  Multiple Vitamin (MULTIVITAMIN WITH MINERALS) TABS tablet Take 1 tablet by mouth daily. 07/08/15  Yes Jeanella Craze, NP  pantoprazole sodium (PROTONIX) 40 mg/20 mL PACK Place 20 mLs (40 mg total) into feeding tube at bedtime. 07/08/15  Yes Jeanella Craze, NP  potassium chloride SA (K-DUR,KLOR-CON) 20 MEQ tablet Take 20 mEq by mouth daily. 06/12/15  Yes Historical Provider, MD  sodium chloride 0.9 % infusion Inject 10 mLs into the vein continuous. 07/08/15  Yes Jeanella Craze, NP  white petrolatum (VASELINE) GEL Apply 1 application topically daily. Apply to L nare wound daily 07/08/15  Yes Jeanella Craze, NP  atorvastatin (LIPITOR) 80 MG tablet Take 1 tablet (80 mg total) by mouth daily at 6 PM. Patient not taking: Reported on 07/21/2015 05/31/15   Erin R Barrett, PA-C  chlorhexidine gluconate (PERIDEX) 0.12 % solution 15 mLs by Mouth Rinse route 2 (two) times daily. Patient not taking: Reported on 07/21/2015 07/08/15   Jeanella Craze, NP  collagenase (SANTYL)  ointment Apply topically daily. Apply to R foot wound daily Patient not taking: Reported on 07/21/2015 07/08/15   Jeanella Craze, NP  insulin aspart (NOVOLOG) 100 UNIT/ML injection Inject 2-6 Units into the skin 3 (three) times daily with meals. Patient not taking: Reported on 07/21/2015 07/08/15   Jeanella Craze, NP  Maltodextrin-Xanthan Gum (RESOURCE THICKENUP CLEAR) POWD As needed for honey thick liquid consistency Patient not taking: Reported on 07/21/2015 07/08/15   Jeanella Craze, NP     Vital Signs: BP 129/51 mmHg  Pulse 71  Temp(Src) 97.5 F (36.4 C) (Axillary)  Resp 35  Wt 272 lb 11.3 oz (123.7 kg)  SpO2 100%  Physical Exam R/L abd drains intact, insertion sites ok, outputs 750/1650 cc respectively yellow and brown colored fluid ; cx's pend  Imaging: Ct Abdomen Pelvis Wo Contrast  07/21/2015   CLINICAL DATA:  69 year old male with history of prior colectomy for ischemic colitis presenting with septic shock and acute respiratory failure.  EXAM: CT ABDOMEN AND PELVIS WITHOUT CONTRAST  TECHNIQUE: Multidetector CT imaging of the abdomen and pelvis was performed following the standard protocol without IV contrast.  COMPARISON:  CT dated 06/26/2015  FINDINGS: Evaluation of this exam is limited in the absence of intravenous contrast. Evaluation is also limited due to streak artifact caused by patient's arms.  Partially visualized small bilateral pleural effusions with subsegmental consolidative changes of the lung bases compatible with atelectasis versus pneumonia. There is coronary vascular calcification and CABG  clips.  No intra-abdominal free air.  No free fluid identified.  The liver is grossly unremarkable. Layering high attenuating material within the gallbladder may represent sludge or small stones versus vicariously excreted contrast material from prior study. There is no pericholecystic fluid. The pancreas, spleen, adrenal glands appear unremarkable. There are small nonobstructing bilateral  renal calculi measuring up to 5 mm. There is no hydronephrosis on either side. The visualized ureters appear unremarkable. The urinary bladder is decompressed around a Foley catheter. A 4 mm calcific density along the right bladder wall is similar to the prior study. Enlarged prostate gland measuring up to 6.5 cm in diameter.  Evaluation of the bowel is very limited in the absence of oral contrast. 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 There is fluid distention of the rectal pouch which has increased in size and more distended compared to the prior study. There is haziness of the fat surrounding the upper border of the rectal pouch with small pockets of air concerning for sutural dehiscence. Multiple decompressed loops of small bowel noted within the abdomen and pelvis. A 15 x 12 cm loculated fluid collection noted in the left hemipelvis containing air and fluid. There has been interval increase in the size of this collection with significant increase in the amount of gas within this collection concerning for developing infection. An ill-defined apparent track like structure may be present extending from this collection to the rectal pouch concerning for developing fistula. There is extension of the gas into the left anterior abdominal musculature adjacent and superior to the loculated peritoneal collection. A 17 x 9 cm loculated fluid collection is noted in the right anterior peritoneum superior to the ileostomy.  Mild aortoiliac atherosclerotic disease. There is no lymphadenopathy. There is a small fat containing left inguinal hernia. Midline vertical anterior abdominal wall incisional scar. Degenerative changes of the spine. No acute fracture.  IMPRESSION: Postsurgical changes of colectomy and right lower quadrant ileostomy. No evidence of bowel obstruction. There has been interval increase in the size of fluid field rectal pouch. Small pockets of air superior to the pouch are concerning for  suture dehiscence. Clinical correlation and surgical consult is advised.  Increase in the size of the left hemipelvic peritoneal collection with significant interval increase in the size of the gas within this collection concerning for superimposed infection. An ill-defined track like structure extending from this collection to the rectal pouch is concerning for developing fistula. There is extension of gas in the left anterior abdominal musculature superior to this collection.  Loculated peritoneal fluid collection in the right anterior hemiabdomen superior to the ileostomy.  Small bilateral pleural effusions with bibasilar subsegmental atelectasis/pneumonia.  These results were called by telephone at the time of interpretation on 07/21/2015 at 3:52 am to the charge nurse, Christell Constant, who verbally acknowledged these results.   Electronically Signed   By: Elgie Collard M.D.   On: 07/21/2015 03:55   US Renal Port  07/20/2015   CLINICAL DATA:  Renal failure. Patient on vent. CT congestive heart failure.  EXAM: RENAL / URINARY TRACT ULTRASOUND COMPLETE  COMPARISON:  CT 812 1,016  FINDINGS: Right Kidney:  Length: 11.7 cm.  Normal cortical thickness.  No hydronephrosis.  Left Kidney:  Length: 10.8 cm.  Normal cortical thickness.  No hydronephrosis.  Bladder:  Appears normal for degree of bladder distention.  There several fluid collections within the peritoneal space. These collections have lacy internal echoes consistent with a complex fluid collections. The largest in  LEFT upper quadrant and aright which enters 19 x 10 x 17 cm.  IMPRESSION: 1. No hydronephrosis. 2. Multiple complex fluid collections within the peritoneal space. There was fluid within the peritoneal space on comparison CT however the collection appear larger and have internal complexity suggesting peritonitis or abscess. Recommend CT of the abdomen and pelvis with contrast (if possible) for further evaluation. Findings conveyed tocharge nurse Brie on  07/20/2015 at21:44. Attempting to contact attending physician.   Electronically Signed   By: Genevive Bi M.D.   On: 07/20/2015 21:49   Dg Chest Port 1 View  07/22/2015   CLINICAL DATA:  Septic shock, respiratory failure, intubated patient.  EXAM: PORTABLE CHEST - 1 VIEW  COMPARISON:  Portable chest x-ray of July 21, 2015  FINDINGS: The lungs are adequately inflated. Increased bibasilar atelectasis is present. The cardiac silhouette remains enlarged. The central pulmonary vascularity is mildly prominent. The endotracheal tube tip lies 4.4 cm above the carina. The left internal jugular venous catheter tip projects at junction of the right and left brachiocephalic veins. The right internal jugular catheter is been removed. The esophagogastric tube tip projects below the inferior margin of the image.  IMPRESSION: Worsening bibasilar atelectasis. The support tubes are in reasonable position.   Electronically Signed   By: David  [place] M.D.   On: 07/22/2015 07:02   Dg Chest Port 1 View  07/21/2015   CLINICAL DATA:  Central catheter placement  EXAM: PORTABLE CHEST - 1 VIEW  COMPARISON:  Study obtained earlier in the day  FINDINGS: New central catheter is present with tip in the superior vena cava. A second central catheter from the right side extends into the right neck region, stable compared to earlier in the day. Endotracheal tube tip is 3.4 cm above the carina. Nasogastric tube tip and side port are in the region of the distal stomach. No pneumothorax. There is patchy bibasilar atelectasis, slightly less than earlier today. Lungs elsewhere clear. Heart is upper normal in size with pulmonary vascularity within normal limits. There is no adenopathy. Patient is status post coronary artery bypass grafting.  IMPRESSION: Tube and catheter positions as described without pneumothorax. Note that the right central catheter extends into the right neck region with the tip not seen.  There is patchy bibasilar  atelectasis, slightly less compared to earlier in the day. No change in cardiac silhouette.   Electronically Signed   By: Bretta Bang III M.D.   On: 07/21/2015 11:31   Dg Chest Port 1 View  07/21/2015   CLINICAL DATA:  Respiratory failure, congestive heart failure portable chest x-ray of 07/20/2015  EXAM: PORTABLE CHEST - 1 VIEW  COMPARISON:  None.  FINDINGS: The tip of the endotracheal tube is approximately 4.4 cm above the carina. There has been slight worsening of bibasilar in linear atelectasis. Mild cardiomegaly is stable. The right central venous line extends cephalad, probably within the right internal jugular vein, unchanged. The tip is not visualized.  IMPRESSION: 1. Diminished aeration with increasing bibasilar atelectasis. 2. Tip of endotracheal tube approximately 4.4 cm above the carina. 3. As before, the right IJ central venous line extends into the neck.   Electronically Signed   By: Dwyane Dee M.D.   On: 07/21/2015 08:13   Dg Chest Port 1 View  07/20/2015   CLINICAL DATA:  Endotracheal tube and NG tube placement  EXAM: PORTABLE CHEST - 1 VIEW  COMPARISON:  07/19/2015  FINDINGS: There is endotracheal tube with the tip at the right  mainstem bronchus; recommend retracting the endotracheal tube 2.5 cm.  There is mild bilateral interstitial prominence. There is left basilar linear airspace disease likely reflecting atelectasis. There is no pleural effusion or pneumothorax. There is stable mild cardiomegaly. There is evidence of prior CABG.  The osseous structures are unremarkable.  IMPRESSION: 1. Endotracheal tube with the tip at the right mainstem bronchus; recommend retracting the endotracheal tube 2.5 cm.   Electronically Signed   By: Elige Ko   On: 07/20/2015 18:57   Dg Chest Port 1 View  07/19/2015   CLINICAL DATA:  Rule out pneumonia.  EXAM: PORTABLE CHEST - 1 VIEW  COMPARISON:  07/07/2015  FINDINGS: There is bibasilar hazy airspace disease. There is no pleural effusion or  pneumothorax. The heart mediastinum are stable. There is evidence of prior CABG.  The osseous structures are unremarkable.  IMPRESSION: Bibasilar hazy airspace disease which may reflect atelectasis versus pneumonia.   Electronically Signed   By: Elige Ko   On: 07/19/2015 14:36   Dg Abd Portable 1v  07/21/2015   CLINICAL DATA:  Orogastric tube placement  EXAM: PORTABLE ABDOMEN - 1 VIEW  COMPARISON:  None.  FINDINGS: The orogastric tube is not well seen. There is a tube along the superior aspect of the image with what appears to be a side-port slightly to the right of midline suggesting that the orogastric tube tip and side port are in the distal stomach. Bowel gas pattern unremarkable. There is degenerative change in the lumbar spine.  IMPRESSION: Orogastric tube less than optimally seen. Suspect tube tip and side port in distal stomach, although a repeat study centered over the upper abdomen is advised to confirm this observation. Bowel gas pattern which is visualized appears unremarkable.   Electronically Signed   By: Bretta Bang III M.D.   On: 07/21/2015 08:05   Dg Abd Portable 1v  07/20/2015   CLINICAL DATA:  Nasogastric tube placement.  EXAM: PORTABLE ABDOMEN - 1 VIEW  COMPARISON:  07/03/2015  FINDINGS: Nasogastric tube is not visible despite the history. Endotracheal tube is in place, tip approximately 2 cm above carina. There are coarse markings in the lungs.  IMPRESSION: No enteric tube identified.   Electronically Signed   By: Norva Pavlov M.D.   On: 07/20/2015 18:15   Ct Image Guided Drainage By Percutaneous Catheter  07/21/2015   CLINICAL DATA:  Right and left abdominal abscess is  EXAM: CT IMAGE GUIDED DRAINAGE BY PERCUTANEOUS CATHETER  FLUOROSCOPY TIME:  None  MEDICATIONS AND MEDICAL HISTORY: Patient is intubated and sedated  ANESTHESIA/SEDATION: Not applicable  CONTRAST:  None  PROCEDURE: The procedure, risks, benefits, and alternatives were explained to the patient. Questions  regarding the procedure were encouraged and answered. The patient understands and consents to the procedure.  The left abdomen was prepped with Betadine in a sterile fashion, and a sterile drape was applied covering the operative field. A sterile gown and sterile gloves were used for the procedure.  Under CT guidance, an 18 gauge needle was inserted into the left abdominal fluid collection. After aspirating serosanguineous fluid, the needle was removed over an Amplatz wire. A 12 French dilator followed by a 12 [place] drain was inserted. The drain was looped and string fixed in the fluid collection. It was sewn to the skin then attached to a gravity drainage bag.  The identical procedure was performed in the right abdomen for the right abdominal fluid collection. This yielded cloudy yellowish fluid.  FINDINGS: Images demonstrate 29  French drain placement into right and left abdominal fluid collections as described.  COMPLICATIONS: None  IMPRESSION: Bilateral abdominal abscess strain as described above yielding serosanguineous fluid from the left abdominal fluid collection and cloudy yellow fluid on the right. Bilateral 12 French drains were utilized.   Electronically Signed   By: Jolaine Click M.D.   On: 07/21/2015 15:06   Ct Image Guided Drainage By Percutaneous Catheter  07/21/2015   CLINICAL DATA:  Right and left abdominal abscess is  EXAM: CT IMAGE GUIDED DRAINAGE BY PERCUTANEOUS CATHETER  FLUOROSCOPY TIME:  None  MEDICATIONS AND MEDICAL HISTORY: Patient is intubated and sedated  ANESTHESIA/SEDATION: Not applicable  CONTRAST:  None  PROCEDURE: The procedure, risks, benefits, and alternatives were explained to the patient. Questions regarding the procedure were encouraged and answered. The patient understands and consents to the procedure.  The left abdomen was prepped with Betadine in a sterile fashion, and a sterile drape was applied covering the operative field. A sterile gown and sterile gloves were used for  the procedure.  Under CT guidance, an 18 gauge needle was inserted into the left abdominal fluid collection. After aspirating serosanguineous fluid, the needle was removed over an Amplatz wire. A 12 French dilator followed by a 12 [place] drain was inserted. The drain was looped and string fixed in the fluid collection. It was sewn to the skin then attached to a gravity drainage bag.  The identical procedure was performed in the right abdomen for the right abdominal fluid collection. This yielded cloudy yellowish fluid.  FINDINGS: Images demonstrate 38 French drain placement into right and left abdominal fluid collections as described.  COMPLICATIONS: None  IMPRESSION: Bilateral abdominal abscess strain as described above yielding serosanguineous fluid from the left abdominal fluid collection and cloudy yellow fluid on the right. Bilateral 12 French drains were utilized.   Electronically Signed   By: Jolaine Click M.D.   On: 07/21/2015 15:06    Labs:  CBC:  Recent Labs  07/21/15 1740 07/22/15 0001 07/22/15 0445 07/22/15 1115  WBC 31.5* 30.3* 20.7* 19.3*  HGB 10.9* 10.9* 10.7* 9.8*  HCT 36.8* 37.5* 35.6* 31.0*  PLT 254 236 161 124*    COAGS:  Recent Labs  07/06/15 0600 07/07/15 1003 07/08/15 0515 07/21/15 0815 07/21/15 1740  INR 1.51* 1.48 1.49 1.65* 1.95*  APTT 24 26 29 29   --     BMP:  Recent Labs  07/21/15 1740 07/22/15 0001 07/22/15 0445 07/22/15 1115  NA 146* 145 145 144  K 4.7 5.3* 4.5 3.8  CL 125* 123* 123* 123*  CO2 9* 8* 12* 13*  GLUCOSE 164* 185* 175* 195*  BUN 71* 73* 75* 75*  CALCIUM 7.8* 7.8* 7.7* 7.3*  CREATININE 2.31* 2.31* 2.12* 1.88*  GFRNONAA 27* 27* 30* 35*  GFRAA 31* 31* 35* 40*    LIVER FUNCTION TESTS:  Recent Labs  06/25/15 0430 06/29/15 0610 07/21/15 0405 07/22/15 0445  BILITOT 1.0 1.2 0.9 1.0  AST 21 29 27  33  ALT 14* 16* 22 19  ALKPHOS 75 85 136* 151*  PROT 4.0* 5.1* 5.3* 4.9*  ALBUMIN 1.1* 1.3* 1.2* 1.0*    Assessment and  Plan:  S/p R/L abd abscess drains 9/6, post colectomy; pt afebrile; WBC down slightly to 19.3(20.7), hgb stable; check final fluid cx's; cont drain irrigations; monitor output closely; recheck f/u CT within 1 week of drain(s) placement or sooner if clinical status worsens  Signed: D. Caryn Bee Allred 07/22/2015, 1:00 PM   I spent a  total of 15 minutes at the the patient's bedside AND on the patient's hospital floor or unit, greater than 50% of which was counseling/coordinating care for abd abscess drains

## 2015-07-23 ENCOUNTER — Inpatient Hospital Stay (HOSPITAL_COMMUNITY): Payer: Medicare Other

## 2015-07-23 LAB — POCT I-STAT 3, ART BLOOD GAS (G3+)
ACID-BASE DEFICIT: 5 mmol/L — AB (ref 0.0–2.0)
Acid-base deficit: 7 mmol/L — ABNORMAL HIGH (ref 0.0–2.0)
BICARBONATE: 17.6 meq/L — AB (ref 20.0–24.0)
Bicarbonate: 15.9 mEq/L — ABNORMAL LOW (ref 20.0–24.0)
O2 SAT: 99 %
O2 Saturation: 96 %
PH ART: 7.486 — AB (ref 7.350–7.450)
PO2 ART: 126 mmHg — AB (ref 80.0–100.0)
TCO2: 18 mmol/L (ref 0–100)
TCO2: 17 mmol/L (ref 0–100)
pCO2 arterial: 23.3 mmHg — ABNORMAL LOW (ref 35.0–45.0)
pCO2 arterial: 24.3 mmHg — ABNORMAL LOW (ref 35.0–45.0)
pH, Arterial: 7.425 (ref 7.350–7.450)
pO2, Arterial: 76 mmHg — ABNORMAL LOW (ref 80.0–100.0)

## 2015-07-23 LAB — GLUCOSE, CAPILLARY
GLUCOSE-CAPILLARY: 150 mg/dL — AB (ref 65–99)
GLUCOSE-CAPILLARY: 149 mg/dL — AB (ref 65–99)
GLUCOSE-CAPILLARY: 130 mg/dL — AB (ref 65–99)
GLUCOSE-CAPILLARY: 135 mg/dL — AB (ref 65–99)
Glucose-Capillary: 141 mg/dL — ABNORMAL HIGH (ref 65–99)
Glucose-Capillary: 175 mg/dL — ABNORMAL HIGH (ref 65–99)

## 2015-07-23 LAB — COMPREHENSIVE METABOLIC PANEL
ALT: 15 U/L — ABNORMAL LOW (ref 17–63)
ANION GAP: 13 (ref 5–15)
AST: 30 U/L (ref 15–41)
Alkaline Phosphatase: 154 U/L — ABNORMAL HIGH (ref 38–126)
BUN: 75 mg/dL — ABNORMAL HIGH (ref 6–20)
CHLORIDE: 115 mmol/L — AB (ref 101–111)
CO2: 16 mmol/L — AB (ref 22–32)
Calcium: 6.9 mg/dL — ABNORMAL LOW (ref 8.9–10.3)
Creatinine, Ser: 1.86 mg/dL — ABNORMAL HIGH (ref 0.61–1.24)
GFR calc non Af Amer: 35 mL/min — ABNORMAL LOW (ref >60)
GFR, EST AFRICAN AMERICAN: 41 mL/min — AB (ref >60)
GLUCOSE: 156 mg/dL — AB (ref 65–99)
POTASSIUM: 3.5 mmol/L (ref 3.5–5.1)
SODIUM: 144 mmol/L (ref 135–145)
Total Bilirubin: 1 mg/dL (ref 0.3–1.2)
Total Protein: 4.6 g/dL — ABNORMAL LOW (ref 6.5–8.1)

## 2015-07-23 LAB — PROTIME-INR
INR: 2.62 — ABNORMAL HIGH (ref 0.00–1.49)
PROTHROMBIN TIME: 27.6 s — AB (ref 11.6–15.2)

## 2015-07-23 LAB — PROCALCITONIN: PROCALCITONIN: 6.94 ng/mL

## 2015-07-23 LAB — APTT: APTT: 38 s — AB (ref 24–37)

## 2015-07-23 MED ORDER — VECURONIUM BROMIDE 10 MG IV SOLR: 10.0000 mg | Freq: Once | INTRAVENOUS | Status: DC

## 2015-07-23 MED ORDER — MIDAZOLAM HCL 2 MG/2ML IJ SOLN: 1.0000 mg | INTRAMUSCULAR | Status: DC | PRN

## 2015-07-23 MED ORDER — ETOMIDATE 2 MG/ML IV SOLN
INTRAVENOUS | Status: AC
  Filled 2015-07-23: qty 10

## 2015-07-23 MED ORDER — VITAL HIGH PROTEIN PO LIQD
1000.0000 mL | ORAL | Status: DC
  Administered 2015-07-23 – 2015-07-25 (×4): 1000 mL
  Filled 2015-07-23 (×5): qty 1000

## 2015-07-23 MED ORDER — ETOMIDATE 2 MG/ML IV SOLN
40.0000 mg | Freq: Once | INTRAVENOUS | Status: AC
  Administered 2015-07-23: 20 mg via INTRAVENOUS
  Filled 2015-07-23 (×2): qty 20

## 2015-07-23 MED ORDER — PRO-STAT SUGAR FREE PO LIQD
30.0000 mL | Freq: Every day | ORAL | Status: DC
  Administered 2015-07-23 – 2015-07-25 (×11): 30 mL
  Filled 2015-07-23 (×15): qty 30

## 2015-07-23 MED ORDER — FENTANYL CITRATE (PF) 100 MCG/2ML IJ SOLN: 25.0000 ug | INTRAMUSCULAR | Status: AC | PRN

## 2015-07-23 MED ORDER — MIDAZOLAM HCL 2 MG/2ML IJ SOLN: 4.0000 mg | Freq: Once | INTRAMUSCULAR | Status: DC

## 2015-07-23 MED ORDER — FENTANYL CITRATE (PF) 100 MCG/2ML IJ SOLN: 200.0000 ug | Freq: Once | INTRAMUSCULAR | Status: DC

## 2015-07-23 MED ORDER — LINEZOLID 600 MG/300ML IV SOLN
600.0000 mg | Freq: Two times a day (BID) | INTRAVENOUS | Status: DC
  Administered 2015-07-23 – 2015-07-24 (×3): 600 mg via INTRAVENOUS
  Filled 2015-07-23 (×4): qty 300

## 2015-07-23 MED ORDER — PROPOFOL 1000 MG/100ML IV EMUL
5.0000 ug/kg/min | Freq: Once | INTRAVENOUS | Status: DC
  Filled 2015-07-23: qty 50

## 2015-07-23 NOTE — Progress Notes (Signed)
Discussed patient condition, lab work, lines and drains and ventilator with patient's wife.  She verbalized understanding of her husbands critical condition.  Will continue updates as needed.  Wife stated "I've been real sick, so I can't come up there to see him."

## 2015-07-23 NOTE — Consult Note (Signed)
PULMONARY / CRITICAL CARE MEDICINE   Name: Jonathan Le MRN: [id number] DOB: 11-06-1946    ADMISSION DATE:  07/21/2015 CONSULTATION DATE: 06/18/15   CHIEF COMPLAINT:  Septic shock secondary to ischemic colon. Acute respiratiory failure. Acute renal failure.  INITIAL PRESENTATION: 69 year old male with PMH of OSA and HTN. 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 Post discharge course complicated by R leg cellulitis after endoscopic vein harvest he was seen by Dr. Donata Clay in clinic for this,was having some constipation. 8/4 PM he took an enema and was straining in bathroom. He became diaphoretic and had loss of consciousness. In ED he was noted to be profoundly hypotensive with SOB. Found to be in severe septic shock with presumed ischemic colon.  He was then taken to the OR emergently by Dr. Dwain Sarna and had abdominal colectomy 8/4.  Returned to OR 8/6 for abdominal closure with ileostomy. Post operatively, he self extubated on 8/8.   He required re-intubation and on 8/19, he subsequently had tracheostomy performed by Dr. Pollyann Kennedy of ENT. On 8/24, he was discharged to [facility].  While on [facility], he self de-cannulated at some point and on 9/5, he had a suspected aspiration event and developed respiratory failure. PCCM was consulted and pt was intubated.  Later that night, he had ongoing hypotension despite increasing dose of levophed.  Renal US earlier in the day revealed findings concerning for peritonitis vs abscess.  Pt was transferred to [facility] ICU and CCS was consulted for further recs.   STUDIES:  Echo 8/4 >>> EF 25% to 30%. Severe hypokinesis of the anteroseptal and anterior myocardium.Dyskinesis of the apical myocardium. CT chest / abd pelv 8/4 >>>mod to severe cardiomegaly with small pericardial effusion.  Pulmonary edema  with small pleural effusions.  Focal colonic hepatic flexure pneumatosis without perforation.  Bilateral nonobstructing nephrolithiasis. CT A / P 8/12 >>> no evidence abscess.  Ascites present, GB sludge, small b/l pleural effusions. Renal US 9/5 >>>  No hydronephrosis, multiple complex fluid collections within the peritoneal space.  Fluid spaces larger with internal complexity suggesting peritonitis or abscess. CT A / P 9/5 >>>large abscess abdo   SIGNIFICANT EVENTS: 8/4 Total abdominal colectomy with abdominal vac placement 8/6 Exploratory laparotomy, removal of prior packs, closure of open abdomen, ileostomy 8/7 high pressors 8/8 self extubated 8/9  resp distress , required bipap , lasix 8/13 had to restart levophed for shock overnight. 8/16 reintubated, primarily for MS, agitation, tachypnea 8/19 Trach placed 8/22 tolerating TC. 8/23 to [facility] 9/5 PCCM called back for aspiration PNA and respiratory failure.  Pt reintubated.  Later transferred to [facility] ICU for worsening septic shock. 9/6 - emergent drainage abscess IR, refractory shock 9/7- improved pressors  SUBJECTIVE:  Pressors remain at 12 mics, hypothermia noted  VITAL SIGNS:BP 120/50 mmHg  Pulse 69  Temp(Src) 99.4 F (37.4 C) (Oral)  Resp 35  Wt 123.7 kg (272 lb 11.3 oz)  SpO2 99%   VENTILATOR SETTINGS: Vent Mode:  [-] PRVC FiO2 (%):  [40 %] 40 % Set Rate:  [35 bmp] 35 bmp Vt Set:  [560 mL] 560 mL PEEP:  [5 cmH20] 5 cmH20 Plateau Pressure:  [13 cmH20-22 cmH20] 22 cmH20PRVC  INTAKE / OUTPUT:  Intake/Output Summary (Last 24 hours) at 07/23/15 5784 Last data filed at 07/23/15 0700  Gross  per 24 hour  Intake 5548.79 ml  Output    980 ml  Net 4568.79 ml    PHYSICAL EXAMINATION: General: Adult male, on vent Neuro: Not following commands., rass -4, flickers arms / legs Head: left ij clean EENT: PERRL 2 Cardiovascular: RRR, no M/R/G. Lungs: CTA anterior Abdomen: Soft, non-tender, surgical dressing with vac in  place, ileostomy in place. Drains with foul smelling output reduced Ext: 3+ edema  LABS:  CBC  Recent Labs Lab 07/22/15 0001 07/22/15 0445 07/22/15 1115  WBC 30.3* 20.7* 19.3*  HGB 10.9* 10.7* 9.8*  HCT 37.5* 35.6* 31.0*  PLT 236 161 124*   Coag's  Recent Labs Lab 07/21/15 0815 07/21/15 1740  APTT 29  --   INR 1.65* 1.95*   BMET  Recent Labs Lab 07/22/15 0445 07/22/15 1115 07/23/15 0600  NA 145 144 144  K 4.5 3.8 3.5  CL 123* 123* 115*  CO2 12* 13* 16*  BUN 75* 75* 75*  CREATININE 2.12* 1.88* 1.86*  GLUCOSE 175* 195* 156*   Electrolytes  Recent Labs Lab 07/21/15 0405  07/22/15 0445 07/22/15 1115 07/23/15 0600  CALCIUM 8.2*  < > 7.7* 7.3* 6.9*  MG 2.1  --   --   --   --   PHOS 4.9*  --   --   --   --   < > = values in this interval not displayed. Sepsis Markers  Recent Labs Lab 07/21/15 0404 07/21/15 0405 07/22/15 0445 07/23/15 0600  LATICACIDVEN 4.3*  --   --   --   PROCALCITON  --  4.40 8.66 6.94   ABG  Recent Labs Lab 07/21/15 1126 07/22/15 0355 07/22/15 1233  PHART 7.259* 7.166* 7.340*  PCO2ART 23.9* 25.1* 25.5*  PO2ART 89.0 118.0* 125.0*   Liver Enzymes  Recent Labs Lab 07/21/15 0405 07/22/15 0445 07/23/15 0600  AST 27 33 30  ALT 22 19 15*  ALKPHOS 136* 151* 154*  BILITOT 0.9 1.0 1.0  ALBUMIN 1.2* 1.0* <1.0*   Cardiac Enzymes  Recent Labs Lab 07/21/15 0405  TROPONINI 0.06*   Glucose  Recent Labs Lab 07/22/15 1113 07/22/15 1538 07/22/15 1947 07/22/15 2354 07/23/15 0346 07/23/15 0755  GLUCAP 172* 161* 184* 175* 150* 149*   Imaging Dg Chest Port 1 View  07/23/2015   CLINICAL DATA:  Septic shock.  EXAM: PORTABLE CHEST - 1 VIEW  COMPARISON:  07/22/2015  FINDINGS: Endotracheal tube, left internal jugular venous catheter, with tip projecting over the junction of the brachiocephalic veins, and enteric catheter with tip collimated off the image, are stable. Median sternotomy wires and postsurgical changes from CABG  are also stable.  Cardiomediastinal silhouette is stably enlarged. Mediastinal contours appear intact.  There is no evidence of focal airspace consolidation, pleural effusion or pneumothorax. There are low lung volumes with bibasilar atelectasis.  Osseous structures are without acute abnormality. Soft tissues are grossly normal.  IMPRESSION: Low lung volumes with bibasilar atelectasis.  Stable appearance of the supporting lines and tubes.   Electronically Signed   By: Ted Mcalpine M.D.   On: 07/23/2015 07:24    ASSESSMENT / PLAN:  PULMONARY ETT 8/4 >>8/8 ETT 8/16 >> 8/19 Trach 8/19 (Dr Pollyann Kennedy) >> self decannulated exact date unknown. ETT 9/5>>> A:  VDRF - reintubated 9/5 for aspiration and inability to protect airway. OSA. Aspiration PNA. ATx bases P:   He had a long hospital course, wife wants continued aggressive care, consider early re trach, could benefit with less sedation, less discomfort and  continued support pcxr in am  Avoid such gross pos balance now Obtain abg now, ensure still needs such high MV  CARDIOVASCULAR A:  Septic shock - intraabdominal abscess Ischemic cardiomyopathy - hx CABG on 7/16 Refractory shock mal placed picc removed 9/6 P:  Levophed still required, to map 60 Vaso to off if levo to 5 Gross pos balance, kvo  RENAL A:   AKI - suspect pre-renal from low flow state / hypoperfusion. Mild hypernatremia. NAG metabolic acidosis. P:   Keep bicarb for NON AG acidosis that still remains, reduce rate to 75 abg now for MV needs as we correct bicarb non gap BMP in AM.  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 GI prophylaxis. Nutrition Breakdown / necrosis rectum P:   Protonix for SUP. Tf to goal, appreciate ccs guidance  HEMATOLOGIC A:  Anemia - chronic. VTE prophylaxis. P:  Transfuse per usual ICU guidelines. SCD's / Heparin No need q12h cbc  INFECTIOUS A:   Septic shock - abdominal  abscess). R LE ulcer, chronic. At risk VRE P:   BCx 9/4 >>> Sputum 9/4 >>> Abscess IR 9/7>>>mod enteroccus>>> Abx:  9/6 Vanc>>>9/8 linazolid 9/8 >>> Imipenem 9/8 >> aningulofungin 9/7>>>  Wont come off pressors, at risk VRE, change to linazolid Maintain other coverage with some stagnation  ENDOCRINE A:   Hyperglycemia without hx of DM. controlled P: SSI.   NEUROLOGIC A:  Acute Metabolic encephalopathy. Pain, Anxiety. Generalized deconditioning. P:   RASS goal: -1 Daily WUA Goal to dc versed drip  Family updates:  None.  Interdisciplinary meeting due by:  9/12.  Ccm time 35 min   Mcarthur Rossetti. Tyson Alias, MD, FACP Pgr: [phone] [facility]

## 2015-07-23 NOTE — Procedures (Signed)
Name:  Jonathan Le MRN:  [id number] DOB:  1946/06/24  OPERATIVE NOTE  Procedure:  Percutaneous tracheostomy.  Indications:  Ventilator-dependent respiratory failure.  Consent:  Procedure, alternatives, risks and benefits discussed with medical POA.  Questions answered.  Consent obtained.  Anesthesia:  Versed, fent, etomidate  Procedure summary:  Appropriate equipment was assembled.  The patient was identified as [name] and safety timeout was performed. The patient was placed in supine position with a towel roll behind shoulder blades and neck extended.  Sterile technique was used. The patient's neck and upper chest were prepped using chlorhexidine / alcohol scrub and the field was draped in usual sterile fashion with full body drape. 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 . Then, using Seldinger technique and a percutaneous tracheostomy set, the trachea was entered with a 14 gauge sheath, I never used needle. This was all confirmed under direct visualization of a fiberoptic flexible bronchoscope. Entrance into the trachea was identified through the third tracheal ring interspace. Following this, a guidewire was inserted.  The sheath and the guidewire intact. Next, the sheath was removed and a small dilator was inserted. The tracheal rings were then dilated. A #8  Shiley was then opened. The balloon was checked. It was placed over a tracheal dilator, which was then advanced over the guidewire and through the previously dilated tract. The Shiley tracheostomy tube was noted to pass in the trachea with little resistance. The guidewire and dilator tubes were removed from the trachea. An inner cannula was placed through the tracheostomy tube. The tracheostomy was then secured at the  anterior neck with 4 monofilament sutures. The oral endotracheal tube was removed and the ventilator was attached to the newly placed tracheostomy tube. Adequate tidal volumes were noted. The cuff was inflated and no evidence of air leak was noted. No evidence of bleeding was noted. At this point, the procedure was concluded. Post-procedure chest x-ray was ordered.  Complications:  No immediate complications were noted.  Hemodynamic parameters and oxygenation remained stable throughout the procedure.  Estimated blood loss:  Less then 5 mL.  [name]., MD Pulmonary and Critical Care Medicine [facility] Pager: [phone]  07/23/2015, 1:39 PM   Can followup in our perc trach outpt clinic [phone] or Dr Pollyann Kennedy who did first trach '

## 2015-07-23 NOTE — Procedures (Signed)
PCCM Video Bronchoscopy Procedure Note  The patient was informed of the risks (including but not limited to bleeding, infection, respiratory failure, lung injury, tooth/oral injury) and benefits of the procedure and gave consent, see chart.  Indication: Tracheostomy placement, respiratory failure  Post Procedure Diagnosis: Same  Location: [facility], room 2M07  Condition pre procedure: Critically ill on vent  Medications for procedure: fentanyl, etomidate  Procedure description: The bronchoscope was introduced through the endotracheal tube and passed to the bilateral lungs to the level of the subsegmental bronchi throughout the tracheobronchial tree.  Airway exam revealed copious, gray, thick secretions throughout the trache.  Sharp carina, no airway lesions identified.    The bronchoscope was used to retract the endotracheal tube and guide the percuatneous tracheostomy placement.  Procedures performed: BAL RML  Specimens sent: BAL culture  Condition post procedure: critically ill on vent  EBL: none  Complications: none immediate  Heber Glandorf, MD [place] PCCM Pager: [phone] Cell: [phone] After 3pm or if no response, call [phone]

## 2015-07-23 NOTE — Progress Notes (Signed)
Subjective: On vent  Objective: Vital signs in last 24 hours: Temp:  [94.3 F (34.6 C)-99.4 F (37.4 C)] 99.4 F (37.4 C) (09/08 0809) Pulse Rate:  [42-76] 69 (09/08 0730) Resp:  [22-35] 35 (09/08 0730) BP: (86-129)/(50-71) 108/54 mmHg (09/08 0730) SpO2:  [98 %-100 %] 98 % (09/08 0730) Arterial Line BP: (95-154)/(39-71) 110/47 mmHg (09/08 0730) FiO2 (%):  [40 %] 40 % (09/08 0342) Last BM Date: 07/21/15  Intake/Output from previous day: 09/07 0701 - 09/08 0700 In: 5916.1 [I.V.:5486.1; NG/GT:20; IV Piggyback:400] Out: 1040 [Urine:565; Emesis/NG output:350; Drains:125] Intake/Output this shift:    Neck: dressing on old trach site Resp: few rhonchi Cardio: regular rate and rhythm GI: soft, some BS, stool from ileostomy, both drains output decreased  Lab Results:   Recent Labs  07/22/15 0445 07/22/15 1115  WBC 20.7* 19.3*  HGB 10.7* 9.8*  HCT 35.6* 31.0*  PLT 161 124*   BMET  Recent Labs  07/22/15 1115 07/23/15 0600  NA 144 144  K 3.8 3.5  CL 123* 115*  CO2 13* 16*  GLUCOSE 195* 156*  BUN 75* 75*  CREATININE 1.88* 1.86*  CALCIUM 7.3* 6.9*   PT/INR  Recent Labs  07/21/15 0815 07/21/15 1740  LABPROT 19.5* 22.2*  INR 1.65* 1.95*   ABG  Recent Labs  07/22/15 0355 07/22/15 1233  PHART 7.166* 7.340*  HCO3 9.1* 13.8*    Studies/Results: Dg Chest Port 1 View  07/23/2015   CLINICAL DATA:  Septic shock.  EXAM: PORTABLE CHEST - 1 VIEW  COMPARISON:  07/22/2015  FINDINGS: Endotracheal tube, left internal jugular venous catheter, with tip projecting over the junction of the brachiocephalic veins, and enteric catheter with tip collimated off the image, are stable. Median sternotomy wires and postsurgical changes from CABG are also stable.  Cardiomediastinal silhouette is stably enlarged. Mediastinal contours appear intact.  There is no evidence of focal airspace consolidation, pleural effusion or pneumothorax. There are low lung volumes with bibasilar  atelectasis.  Osseous structures are without acute abnormality. Soft tissues are grossly normal.  IMPRESSION: Low lung volumes with bibasilar atelectasis.  Stable appearance of the supporting lines and tubes.   Electronically Signed   By: Ted Mcalpine M.D.   On: 07/23/2015 07:24   Dg Chest Port 1 View  07/22/2015   CLINICAL DATA:  Septic shock, respiratory failure, intubated patient.  EXAM: PORTABLE CHEST - 1 VIEW  COMPARISON:  Portable chest x-ray of July 21, 2015  FINDINGS: The lungs are adequately inflated. Increased bibasilar atelectasis is present. The cardiac silhouette remains enlarged. The central pulmonary vascularity is mildly prominent. The endotracheal tube tip lies 4.4 cm above the carina. The left internal jugular venous catheter tip projects at junction of the right and left brachiocephalic veins. The right internal jugular catheter is been removed. The esophagogastric tube tip projects below the inferior margin of the image.  IMPRESSION: Worsening bibasilar atelectasis. The support tubes are in reasonable position.   Electronically Signed   By: David  [place] M.D.   On: 07/22/2015 07:02   Dg Chest Port 1 View  07/21/2015   CLINICAL DATA:  Central catheter placement  EXAM: PORTABLE CHEST - 1 VIEW  COMPARISON:  Study obtained earlier in the day  FINDINGS: New central catheter is present with tip in the superior vena cava. A second central catheter from the right side extends into the right neck region, stable compared to earlier in the day. Endotracheal tube tip is 3.4 cm above the carina. Nasogastric tube tip  and side port are in the region of the distal stomach. No pneumothorax. There is patchy bibasilar atelectasis, slightly less than earlier today. Lungs elsewhere clear. Heart is upper normal in size with pulmonary vascularity within normal limits. There is no adenopathy. Patient is status post coronary artery bypass grafting.  IMPRESSION: Tube and catheter positions as described  without pneumothorax. Note that the right central catheter extends into the right neck region with the tip not seen.  There is patchy bibasilar atelectasis, slightly less compared to earlier in the day. No change in cardiac silhouette.   Electronically Signed   By: Bretta Bang III M.D.   On: 07/21/2015 11:31   Ct Image Guided Drainage By Percutaneous Catheter  07/21/2015   CLINICAL DATA:  Right and left abdominal abscess is  EXAM: CT IMAGE GUIDED DRAINAGE BY PERCUTANEOUS CATHETER  FLUOROSCOPY TIME:  None  MEDICATIONS AND MEDICAL HISTORY: Patient is intubated and sedated  ANESTHESIA/SEDATION: Not applicable  CONTRAST:  None  PROCEDURE: The procedure, risks, benefits, and alternatives were explained to the patient. Questions regarding the procedure were encouraged and answered. The patient understands and consents to the procedure.  The left abdomen was prepped with Betadine in a sterile fashion, and a sterile drape was applied covering the operative field. A sterile gown and sterile gloves were used for the procedure.  Under CT guidance, an 18 gauge needle was inserted into the left abdominal fluid collection. After aspirating serosanguineous fluid, the needle was removed over an Amplatz wire. A 12 French dilator followed by a 12 [place] drain was inserted. The drain was looped and string fixed in the fluid collection. It was sewn to the skin then attached to a gravity drainage bag.  The identical procedure was performed in the right abdomen for the right abdominal fluid collection. This yielded cloudy yellowish fluid.  FINDINGS: Images demonstrate 14 French drain placement into right and left abdominal fluid collections as described.  COMPLICATIONS: None  IMPRESSION: Bilateral abdominal abscess strain as described above yielding serosanguineous fluid from the left abdominal fluid collection and cloudy yellow fluid on the right. Bilateral 12 French drains were utilized.   Electronically Signed   By: Jolaine Click M.D.   On: 07/21/2015 15:06   Ct Image Guided Drainage By Percutaneous Catheter  07/21/2015   CLINICAL DATA:  Right and left abdominal abscess is  EXAM: CT IMAGE GUIDED DRAINAGE BY PERCUTANEOUS CATHETER  FLUOROSCOPY TIME:  None  MEDICATIONS AND MEDICAL HISTORY: Patient is intubated and sedated  ANESTHESIA/SEDATION: Not applicable  CONTRAST:  None  PROCEDURE: The procedure, risks, benefits, and alternatives were explained to the patient. Questions regarding the procedure were encouraged and answered. The patient understands and consents to the procedure.  The left abdomen was prepped with Betadine in a sterile fashion, and a sterile drape was applied covering the operative field. A sterile gown and sterile gloves were used for the procedure.  Under CT guidance, an 18 gauge needle was inserted into the left abdominal fluid collection. After aspirating serosanguineous fluid, the needle was removed over an Amplatz wire. A 12 French dilator followed by a 12 [place] drain was inserted. The drain was looped and string fixed in the fluid collection. It was sewn to the skin then attached to a gravity drainage bag.  The identical procedure was performed in the right abdomen for the right abdominal fluid collection. This yielded cloudy yellowish fluid.  FINDINGS: Images demonstrate 66 French drain placement into right and left abdominal fluid collections as  described.  COMPLICATIONS: None  IMPRESSION: Bilateral abdominal abscess strain as described above yielding serosanguineous fluid from the left abdominal fluid collection and cloudy yellow fluid on the right. Bilateral 12 French drains were utilized.   Electronically Signed   By: Jolaine Click M.D.   On: 07/21/2015 15:06    Anti-infectives: Anti-infectives    Start     Dose/Rate Route Frequency Ordered Stop   07/22/15 1000  anidulafungin (ERAXIS) 100 mg in sodium chloride 0.9 % 100 mL IVPB     100 mg over 90 Minutes Intravenous Every 24 hours 07/21/15 0959      07/21/15 1800  imipenem-cilastatin (PRIMAXIN) 250 mg in sodium chloride 0.9 % 100 mL IVPB     250 mg 200 mL/hr over 30 Minutes Intravenous 4 times per day 07/21/15 1401     07/21/15 1200  imipenem-cilastatin (PRIMAXIN) 250 mg in sodium chloride 0.9 % 100 mL IVPB  Status:  Discontinued     250 mg 200 mL/hr over 30 Minutes Intravenous Every 12 hours 07/21/15 0200 07/21/15 1401   07/21/15 1100  vancomycin (VANCOCIN) 1,250 mg in sodium chloride 0.9 % 250 mL IVPB     1,250 mg 166.7 mL/hr over 90 Minutes Intravenous Every 24 hours 07/21/15 1006     07/21/15 1000  anidulafungin (ERAXIS) 200 mg in sodium chloride 0.9 % 200 mL IVPB     200 mg over 180 Minutes Intravenous  Once 07/21/15 0959 07/21/15 1825   07/21/15 0230  vancomycin (VANCOCIN) IVPB 1000 mg/200 mL premix     1,000 mg 200 mL/hr over 60 Minutes Intravenous  Once 07/21/15 0200 07/21/15 0334   07/21/15 0230  imipenem-cilastatin (PRIMAXIN) 500 mg in sodium chloride 0.9 % 100 mL IVPB     500 mg 200 mL/hr over 30 Minutes Intravenous  Once 07/21/15 0200 07/21/15 0303        S/P abdominal colectomy 9/5 for necrotic colon Severe sepsis due to intra-abdominal abscesses  S/P perc drain X 2 - working well. Repeat CT once drainage down per IR.  Drainage from rectum likely from rectal stump breakdown with peritoneal fluid leaking out - no problem as   fecal stream is diverted  Improving some with excellent CCM care FEN - OK to advance TF to goal I spoke with Dr. Amedeo Kinsman E 07/23/2015

## 2015-07-23 NOTE — Progress Notes (Signed)
Nutrition Follow-up  DOCUMENTATION CODES:   Morbid obesity  INTERVENTION:    Increase Vital High Protein to goal rate of 50 ml/h (1200 ml per day) with Prostat 30 ml 5 times per day, to provide 1700 kcals, 180 gm protein, 1003 ml free water daily  NUTRITION DIAGNOSIS:   Inadequate oral intake related to inability to eat as evidenced by NPO status.  Ongoing  GOAL:   Provide needs based on ASPEN/SCCM guidelines  Progressing  MONITOR:   TF tolerance, Vent status, Labs, Weight trends, Skin  REASON FOR ASSESSMENT:   Consult Enteral/tube feeding initiation and management  ASSESSMENT:   Patient with recent hospitalization S/P CABG; required colectomy, ex lap, ileostomy, eventual trach; transferred to [facility] on 8/24.  Admitted to [facility] on 9/6 with septic shock secondary to ischemic colon, acute respiratiory failure, and acute renal failure.  Discussed patient in ICU rounds and with RN today. Okay to advance TF to goal rate.  Patient is currently intubated on ventilator support Temp (24hrs), Avg:97.8 F (36.6 C), Min:94.3 F (34.6 C), Max:99.4 F (37.4 C)   Diet Order:  Diet NPO time specified  Skin:  Wound (see comment) (stg 3 to buttocks & rt coccyx; DTI to nose; VAC to abd )  Last BM:  9/6  Height:   Ht Readings from Last 1 Encounters:  06/30/15  (1.753 m)    Weight:   Wt Readings from Last 1 Encounters:  07/23/15 293 lb 10.4 oz (133.2 kg)    Ideal Body Weight:  72.7 kg  BMI:  Body mass index is 43.35 kg/(m^2).  Estimated Nutritional Needs:   Kcal:  4098-1191  Protein:  180 gm  Fluid:  2-2.5 L  EDUCATION NEEDS:   No education needs identified at this time  Joaquin Courts, RD, LDN, CNSC Pager [phone] After Hours Pager [phone]

## 2015-07-24 ENCOUNTER — Inpatient Hospital Stay (HOSPITAL_COMMUNITY): Payer: Medicare Other

## 2015-07-24 LAB — COMPREHENSIVE METABOLIC PANEL
ALT: 15 U/L — ABNORMAL LOW (ref 17–63)
ANION GAP: 9 (ref 5–15)
AST: 40 U/L (ref 15–41)
Alkaline Phosphatase: 167 U/L — ABNORMAL HIGH (ref 38–126)
BILIRUBIN TOTAL: 0.7 mg/dL (ref 0.3–1.2)
BUN: 70 mg/dL — ABNORMAL HIGH (ref 6–20)
CO2: 22 mmol/L (ref 22–32)
Calcium: 7 mg/dL — ABNORMAL LOW (ref 8.9–10.3)
Chloride: 116 mmol/L — ABNORMAL HIGH (ref 101–111)
Creatinine, Ser: 1.37 mg/dL — ABNORMAL HIGH (ref 0.61–1.24)
GFR calc Af Amer: 59 mL/min — ABNORMAL LOW (ref >60)
GFR calc non Af Amer: 51 mL/min — ABNORMAL LOW (ref >60)
GLUCOSE: 158 mg/dL — AB (ref 65–99)
POTASSIUM: 2.8 mmol/L — AB (ref 3.5–5.1)
SODIUM: 147 mmol/L — AB (ref 135–145)
TOTAL PROTEIN: 4.3 g/dL — AB (ref 6.5–8.1)

## 2015-07-24 LAB — CBC WITH DIFFERENTIAL/PLATELET
BASOS ABS: 0 10*3/uL (ref 0.0–0.1)
Basophils Relative: 0 % (ref 0–1)
Eosinophils Absolute: 0.1 10*3/uL (ref 0.0–0.7)
Eosinophils Relative: 1 % (ref 0–5)
HEMATOCRIT: 28.6 % — AB (ref 39.0–52.0)
Hemoglobin: 9.1 g/dL — ABNORMAL LOW (ref 13.0–17.0)
LYMPHS PCT: 14 % (ref 12–46)
Lymphs Abs: 1.6 10*3/uL (ref 0.7–4.0)
MCH: 26.4 pg (ref 26.0–34.0)
MCHC: 31.8 g/dL (ref 30.0–36.0)
MCV: 82.9 fL (ref 78.0–100.0)
MONO ABS: 0.5 10*3/uL (ref 0.1–1.0)
Monocytes Relative: 4 % (ref 3–12)
NEUTROS ABS: 9.5 10*3/uL — AB (ref 1.7–7.7)
Neutrophils Relative %: 82 % — ABNORMAL HIGH (ref 43–77)
Platelets: 73 10*3/uL — ABNORMAL LOW (ref 150–400)
RBC: 3.45 MIL/uL — ABNORMAL LOW (ref 4.22–5.81)
RDW: 16.6 % — AB (ref 11.5–15.5)
WBC: 11.6 10*3/uL — ABNORMAL HIGH (ref 4.0–10.5)

## 2015-07-24 LAB — GLUCOSE, CAPILLARY
GLUCOSE-CAPILLARY: 136 mg/dL — AB (ref 65–99)
GLUCOSE-CAPILLARY: 147 mg/dL — AB (ref 65–99)
Glucose-Capillary: 121 mg/dL — ABNORMAL HIGH (ref 65–99)
Glucose-Capillary: 125 mg/dL — ABNORMAL HIGH (ref 65–99)
Glucose-Capillary: 136 mg/dL — ABNORMAL HIGH (ref 65–99)
Glucose-Capillary: 146 mg/dL — ABNORMAL HIGH (ref 65–99)

## 2015-07-24 LAB — CULTURE, BLOOD (ROUTINE X 2)
CULTURE: NO GROWTH
Culture: NO GROWTH

## 2015-07-24 LAB — CULTURE, RESPIRATORY

## 2015-07-24 LAB — CULTURE, RESPIRATORY W GRAM STAIN

## 2015-07-24 LAB — CULTURE, ROUTINE-ABSCESS

## 2015-07-24 MED ORDER — DEXTROSE 5 % IV SOLN
INTRAVENOUS | Status: DC
  Administered 2015-07-24: 11:00:00 via INTRAVENOUS

## 2015-07-24 MED ORDER — FUROSEMIDE 10 MG/ML IJ SOLN
40.0000 mg | Freq: Two times a day (BID) | INTRAMUSCULAR | Status: DC
  Administered 2015-07-24 – 2015-07-25 (×3): 40 mg via INTRAVENOUS
  Filled 2015-07-24 (×4): qty 4

## 2015-07-24 MED ORDER — POTASSIUM CHLORIDE 20 MEQ PO PACK
40.0000 meq | PACK | ORAL | Status: DC
  Filled 2015-07-24 (×2): qty 2

## 2015-07-24 MED ORDER — POTASSIUM CHLORIDE 20 MEQ/15ML (10%) PO SOLN
40.0000 meq | ORAL | Status: AC
Start: 2015-07-24 — End: 2015-07-24
  Administered 2015-07-24 (×2): 40 meq
  Filled 2015-07-24 (×2): qty 30

## 2015-07-24 MED ORDER — SODIUM CHLORIDE 0.9 % IV SOLN
500.0000 mg | Freq: Three times a day (TID) | INTRAVENOUS | Status: DC
  Filled 2015-07-24 (×2): qty 500

## 2015-07-24 MED ORDER — VITAMIN K1 10 MG/ML IJ SOLN
10.0000 mg | Freq: Once | INTRAVENOUS | Status: AC
  Administered 2015-07-24: 10 mg via INTRAVENOUS
  Filled 2015-07-24: qty 1

## 2015-07-24 MED ORDER — VANCOMYCIN HCL 10 G IV SOLR
1500.0000 mg | INTRAVENOUS | Status: DC
  Administered 2015-07-24: 1500 mg via INTRAVENOUS
  Filled 2015-07-24 (×2): qty 1500

## 2015-07-24 MED ORDER — PANTOPRAZOLE SODIUM 40 MG PO PACK
40.0000 mg | PACK | Freq: Every day | ORAL | Status: DC
  Administered 2015-07-24 – 2015-07-25 (×2): 40 mg
  Filled 2015-07-24 (×2): qty 20

## 2015-07-24 NOTE — Care Management Note (Signed)
Case Management Note  Patient Details  Name: Jonathan Le MRN: [id number] Date of Birth: 17-Nov-1945  Subjective/Objective:     From [facility], trached yesterday.  Remains on pressors.                Action/Plan:  [facility] is from [facility].  Pt will discharge back to [facility] upon discharge, tentative for 07/25/15.  [facility] is aware of pending discharge and has agreed to accept patient back tomorrow if ready for discharge.  COBRA form is in shadow chart.   Expected Discharge Date:                  Expected Discharge Plan:  Long [facility] Acute Care (LTAC)  In-House Referral:     Discharge planning Services  LTAC  Post Acute Care Choice:  [facility] Choice offered to:  Patient  DME Arranged:    DME Agency:     HH Arranged:    HH Agency:     Status of Service:  In process, will continue to follow  Medicare Important Message Given:    Date Medicare IM Given:    Medicare IM give by:    Date Additional Medicare IM Given:    Additional Medicare Important Message give by:     If discussed at Long Length of Stay Meetings, dates discussed:    Additional Comments:  Cherylann Parr, RN 07/24/2015, 3:53 PM

## 2015-07-24 NOTE — Consult Note (Signed)
PULMONARY / CRITICAL CARE MEDICINE   Name: Jonathan Le MRN: [id number] DOB: 1946/09/21    ADMISSION DATE:  07/21/2015 CONSULTATION DATE: 06/18/15   CHIEF COMPLAINT:  Septic shock secondary to ischemic colon. Acute respiratiory failure. Acute renal failure.  INITIAL PRESENTATION: 69 year old male with PMH of OSA and HTN. 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 Post discharge course complicated by R leg cellulitis after endoscopic vein harvest he was seen by Dr. Donata Clay in clinic for this,was having some constipation. 8/4 PM he took an enema and was straining in bathroom. He became diaphoretic and had loss of consciousness. In ED he was noted to be profoundly hypotensive with SOB. Found to be in severe septic shock with presumed ischemic colon.  He was then taken to the OR emergently by Dr. Dwain Sarna and had abdominal colectomy 8/4.  Returned to OR 8/6 for abdominal closure with ileostomy. Post operatively, he self extubated on 8/8.   He required re-intubation and on 8/19, he subsequently had tracheostomy performed by Dr. Pollyann Kennedy of ENT. On 8/24, he was discharged to [facility].  While on [facility], he self de-cannulated at some point and on 9/5, he had a suspected aspiration event and developed respiratory failure. PCCM was consulted and pt was intubated.  Later that night, he had ongoing hypotension despite increasing dose of levophed.  Renal US earlier in the day revealed findings concerning for peritonitis vs abscess.  Pt was transferred to [facility] ICU and [facility] was consulted for further recs.   STUDIES:  Echo 8/4 >>> EF 25% to 30%. Severe hypokinesis of the anteroseptal and anterior myocardium.Dyskinesis of the apical myocardium. CT chest / abd pelv 8/4 >>>mod to severe cardiomegaly with small pericardial effusion.  Pulmonary edema  with small pleural effusions.  Focal colonic hepatic flexure pneumatosis without perforation.  Bilateral nonobstructing nephrolithiasis. CT A / P 8/12 >>> no evidence abscess.  Ascites present, GB sludge, small b/l pleural effusions. Renal US 9/5 >>>  No hydronephrosis, multiple complex fluid collections within the peritoneal space.  Fluid spaces larger with internal complexity suggesting peritonitis or abscess. CT A / P 9/5 >>>large abscess abdo   SIGNIFICANT EVENTS: 8/4 Total abdominal colectomy with abdominal vac placement 8/6 Exploratory laparotomy, removal of prior packs, closure of open abdomen, ileostomy 8/7 high pressors 8/8 self extubated 8/9  resp distress , required bipap , lasix 8/13 had to restart levophed for shock overnight. 8/16 reintubated, primarily for MS, agitation, tachypnea 8/19 Trach placed 8/22 tolerating TC. 8/23 to [facility] 9/5 PCCM called back for aspiration PNA and respiratory failure.  Pt reintubated.  Later transferred to [facility] ICU for worsening septic shock. 9/6 - emergent drainage abscess IR, refractory shock 9/7- improved pressors 9/8 trach placed  SUBJECTIVE: low dose pressors remain   17 liters up since hospital stay  VITAL SIGNS:BP 106/49 mmHg  Pulse 72  Temp(Src) 97.4 F (36.3 C) (Oral)  Resp 24  Wt 133.2 kg (293 lb 10.4 oz)  SpO2 100%   VENTILATOR SETTINGS: Vent Mode:  [-] PSV;CPAP FiO2 (%):  [40 %] 40 % Set Rate:  [20 bmp] 20 bmp Vt Set:  [560 mL] 560 mL PEEP:  [5 cmH20] 5 cmH20 Pressure Support:  [10 cmH20-12 cmH20] 12 cmH20 Plateau Pressure:  [13 cmH20-19 cmH20] 17 cmH20PRVC  INTAKE / OUTPUT:  Intake/Output  Summary (Last 24 hours) at 07/24/15 1038 Last data filed at 07/24/15 1000  Gross per 24 hour  Intake 4963.39 ml  Output   1065 ml  Net 3898.39 ml    PHYSICAL EXAMINATION: General: Adult male, on vent Neuro: Not following commands., rass -5, grimacing Head: left ij, trach clean EENT: PERRL 2 Cardiovascular: RRR, no  M/R/G. Lungs: CTA anterior Abdomen: Soft, non-tender, surgical dressing with vac in place, ileostomy in place. Drains with foul smelling output lower Ext: 3+ edema Large scrotum swollen  LABS:  CBC  Recent Labs Lab 07/22/15 0445 07/22/15 1115 07/24/15 0345  WBC 20.7* 19.3* 11.6*  HGB 10.7* 9.8* 9.1*  HCT 35.6* 31.0* 28.6*  PLT 161 124* 73*   Coag's  Recent Labs Lab 07/21/15 0815 07/21/15 1740 07/23/15 1200  APTT 29  --  38*  INR 1.65* 1.95* 2.62*   BMET  Recent Labs Lab 07/22/15 1115 07/23/15 0600 07/24/15 0345  NA 144 144 147*  K 3.8 3.5 2.8*  CL 123* 115* 116*  CO2 13* 16* 22  BUN 75* 75* 70*  CREATININE 1.88* 1.86* 1.37*  GLUCOSE 195* 156* 158*   Electrolytes  Recent Labs Lab 07/21/15 0405  07/22/15 1115 07/23/15 0600 07/24/15 0345  CALCIUM 8.2*  < > 7.3* 6.9* 7.0*  MG 2.1  --   --   --   --   PHOS 4.9*  --   --   --   --   < > = values in this interval not displayed. Sepsis Markers  Recent Labs Lab 07/21/15 0404 07/21/15 0405 07/22/15 0445 07/23/15 0600  LATICACIDVEN 4.3*  --   --   --   PROCALCITON  --  4.40 8.66 6.94   ABG  Recent Labs Lab 07/22/15 1233 07/23/15 0918 07/23/15 1042  PHART 7.340* 7.486* 7.425  PCO2ART 25.5* 23.3* 24.3*  PO2ART 125.0* 126.0* 76.0*   Liver Enzymes  Recent Labs Lab 07/22/15 0445 07/23/15 0600 07/24/15 0345  AST 33 30 40  ALT 19 15* 15*  ALKPHOS 151* 154* 167*  BILITOT 1.0 1.0 0.7  ALBUMIN 1.0* <1.0* <1.0*   Cardiac Enzymes  Recent Labs Lab 07/21/15 0405  TROPONINI 0.06*   Glucose  Recent Labs Lab 07/23/15 1204 07/23/15 1545 07/23/15 2040 07/23/15 2350 07/24/15 0328 07/24/15 0747  GLUCAP 141* 130* 135* 146* 136* 147*   Imaging Dg Chest Port 1 View  07/24/2015   CLINICAL DATA:  Sepsis.  EXAM: PORTABLE CHEST - 1 VIEW  COMPARISON:  07/23/2015.  FINDINGS: Interim placement of feeding tube, its tip is below left hemidiaphragm. Tracheostomy tube and left IJ line in stable  position. Prior CABG. Stable cardiomegaly. Low lung volumes with mild subsegmental atelectasis and/or infiltrates in the left mid lung and both lung bases. Small left pleural effusion cannot be excluded. No pneumothorax.  IMPRESSION: 1. Interim placement of feeding tube is tip is below the left hemidiaphragm. Remaining lines and tubes in stable position.  2. Low lung volumes with left mid lung and bibasilar subsegmental atelectasis and/or infiltrates. Small left pleural effusion cannot be excluded.  3.  Prior CABG.  Cardiomegaly.  No pulmonary venous congestion.   Electronically Signed   By: Maisie Fus  Register   On: 07/24/2015 07:10   Dg Chest Port 1 View  07/23/2015   CLINICAL DATA:  Acute respiratory failure.  Replaced trach.  EXAM: PORTABLE CHEST - 1 VIEW  COMPARISON:  07/23/2015  FINDINGS: Tracheostomy tube has been placed, tip approximately 4 cm above the carina. The  tip is difficult to visualize likely secondary to overlying sternotomy wires. Left IJ central line tip overlies the level of the superior vena cava.  The heart is enlarged. Status post median sternotomy and CABG. There is increase dense opacity at the medial left lung base which obscures the hemidiaphragm and is consistent with atelectasis or consolidation. Subsegmental atelectasis is also noted bilaterally.  IMPRESSION: 1. Interval placement of tracheostomy tube. 2. Increased opacity at the left lung base consistent was consolidation or increased atelectasis.   Electronically Signed   By: Norva Pavlov M.D.   On: 07/23/2015 14:15   Dg Abd Portable 1v  07/23/2015   CLINICAL DATA:  NG tube placement.  EXAM: PORTABLE ABDOMEN - 1 VIEW  COMPARISON:  CT exam 07/21/2015  FINDINGS: Nasogastric tube has been removed. A feeding tube has been placed, tip overlying the level of the distal stomach. Visualized bowel gas pattern is nonobstructed. Pigtail type catheters are identified in the right and left lower quadrants.  IMPRESSION: Interval placement of  feeding tube tip overlying the level of the distal stomach.   Electronically Signed   By: Norva Pavlov M.D.   On: 07/23/2015 16:15    ASSESSMENT / PLAN:  PULMONARY ETT 8/4 >>8/8 ETT 8/16 >> 8/19 Trach 8/19 (Dr Pollyann Kennedy) >> self decannulated exact date unknown>>>(redo DF 9/8)>>> ETT 9/5>>> A:  VDRF - reintubated 9/5 for aspiration and inability to protect airway. OSA. Aspiration PNA. P:   pcxr despite such pos balance without edema Weaning today cpap 5 ps 12 needed With trach able to keep off all sedation  CARDIOVASCULAR A:  Septic shock - intraabdominal abscess Ischemic cardiomyopathy - hx CABG on 7/16 Refractory shock mal placed picc removed 9/6 P:  Levophed still required, to map 60 or sys 90 Want to dc aline Vaso to off if levo to 5 Even balance, may need lasix  RENAL A:   AKI - suspect pre-renal from low flow state / hypoperfusion- resolving Mild hypernatremia. NAG metabolic acidosis. 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 GI prophylaxis. Nutrition Breakdown / necrosis rectum P:   Protonix for SUP Tf to goal, appreciate ccs guidance May  Need peg in future, not so close to abdo asbcess  HEMATOLOGIC A:  Anemia - chronic. VTE prophylaxis. Drop in plat (r/o hitt  (unlikley as no hep of recent), r/o dilution, very pos balance) dic ? Drop plat and inr up P:  Transfuse per usual ICU guidelines. SCD's / Heparin continues to be held Cbc in am  Need lasix despite low dose pressors given such gross pos balance,  8 liters in 2 days Repeat coags in am  Get fibrinogen, likley this is dilution plat Vit K   INFECTIOUS A:   Septic shock - abdominal abscess- enterococcal  R LE ulcer, chronic. P:   BCx 9/4 >>> Sputum 9/4 >>> Abscess IR 9/7>>>mod enteroccus>>>amp sens Abx:  9/6 Vanc>>>9/8 linazolid 9/8 >>>9/9 vanc 9/9>>> Imipenem 9/8  >>>9/9 aningulofungin 9/7>>>add stop date 7 days  Dc imi Add stop date aningulo Re add vanc as amp ellergy  ENDOCRINE A:   Hyperglycemia without hx of DM. controlled P: SSI.   NEUROLOGIC A:  Acute Metabolic encephalopathy. Pain, Anxiety. Generalized deconditioning. P:   RASS goal: 0 Daily WUA with trach off all sedation If not awake - ct head by am   Family updates:  None.  Interdisciplinary meeting due by:  9/12.  Ccm time 35 min   Mcarthur Rossetti. Tyson Alias, MD, FACP Pgr: [phone] [facility]

## 2015-07-24 NOTE — Progress Notes (Signed)
Referring Physician(s): Dr Vassie Loll  Chief Complaint:  B abd abscesses  Subjective:  B abscess drain s placed 9/6 Draining well No real change  Allergies: Levaquin; Penicillins; and Doxycycline  Medications: Prior to Admission medications   Medication Sig Start Date End Date Taking? Authorizing Provider  Amino Acids-Protein Hydrolys (FEEDING SUPPLEMENT, PRO-STAT SUGAR FREE 64,) LIQD Place 30 mLs into feeding tube 3 (three) times daily. 07/08/15  Yes Jeanella Craze, NP  aspirin EC 325 MG EC tablet Take 1 tablet (325 mg total) by mouth daily. 05/31/15  Yes Erin R Barrett, PA-C  enoxaparin (LOVENOX) 30 MG/0.3ML injection Inject 0.3 mLs (30 mg total) into the skin daily. 07/08/15  Yes Jeanella Craze, NP  finasteride (PROSCAR) 5 MG tablet Take 5 mg by mouth daily.   Yes Historical Provider, MD  furosemide (LASIX) 40 MG tablet Take 40 mg by mouth daily. 06/12/15  Yes Historical Provider, MD  Multiple Vitamin (MULTIVITAMIN WITH MINERALS) TABS tablet Take 1 tablet by mouth daily. 07/08/15  Yes Jeanella Craze, NP  pantoprazole sodium (PROTONIX) 40 mg/20 mL PACK Place 20 mLs (40 mg total) into feeding tube at bedtime. 07/08/15  Yes Jeanella Craze, NP  potassium chloride SA (K-DUR,KLOR-CON) 20 MEQ tablet Take 20 mEq by mouth daily. 06/12/15  Yes Historical Provider, MD  sodium chloride 0.9 % infusion Inject 10 mLs into the vein continuous. 07/08/15  Yes Jeanella Craze, NP  white petrolatum (VASELINE) GEL Apply 1 application topically daily. Apply to L nare wound daily 07/08/15  Yes Jeanella Craze, NP  atorvastatin (LIPITOR) 80 MG tablet Take 1 tablet (80 mg total) by mouth daily at 6 PM. Patient not taking: Reported on 07/21/2015 05/31/15   Erin R Barrett, PA-C  chlorhexidine gluconate (PERIDEX) 0.12 % solution 15 mLs by Mouth Rinse route 2 (two) times daily. Patient not taking: Reported on 07/21/2015 07/08/15   Jeanella Craze, NP  collagenase (SANTYL) ointment Apply topically daily. Apply to R foot wound  daily Patient not taking: Reported on 07/21/2015 07/08/15   Jeanella Craze, NP  insulin aspart (NOVOLOG) 100 UNIT/ML injection Inject 2-6 Units into the skin 3 (three) times daily with meals. Patient not taking: Reported on 07/21/2015 07/08/15   Jeanella Craze, NP  Maltodextrin-Xanthan Gum (RESOURCE THICKENUP CLEAR) POWD As needed for honey thick liquid consistency Patient not taking: Reported on 07/21/2015 07/08/15   Jeanella Craze, NP     Vital Signs: BP 92/46 mmHg  Pulse 94  Temp(Src) 98 F (36.7 C) (Oral)  Resp 29  Wt 293 lb 10.4 oz (133.2 kg)  SpO2 100%  Physical Exam  Abdominal:  Rt drain intact Clean and dry Output yellow/milky fluid 25 cc yesterday; 50 cc in bag No growth  Lt drain intact Clean and dry Output brown fluid 0 output yesterday 200 cc in bag enterococcus  Nursing note and vitals reviewed. WBC 11.6  Imaging: Ct Abdomen Pelvis Wo Contrast  07/21/2015   CLINICAL DATA:  69 year old male with history of prior colectomy for ischemic colitis presenting with septic shock and acute respiratory failure.  EXAM: CT ABDOMEN AND PELVIS WITHOUT CONTRAST  TECHNIQUE: Multidetector CT imaging of the abdomen and pelvis was performed following the standard protocol without IV contrast.  COMPARISON:  CT dated 06/26/2015  FINDINGS: Evaluation of this exam is limited in the absence of intravenous contrast. Evaluation is also limited due to streak artifact caused by patient's arms.  Partially visualized small bilateral pleural effusions with subsegmental  consolidative changes of the lung bases compatible with atelectasis versus pneumonia. There is coronary vascular calcification and CABG clips.  No intra-abdominal free air.  No free fluid identified.  The liver is grossly unremarkable. Layering high attenuating material within the gallbladder may represent sludge or small stones versus vicariously excreted contrast material from prior study. There is no pericholecystic fluid. The pancreas,  spleen, adrenal glands appear unremarkable. There are small nonobstructing bilateral renal calculi measuring up to 5 mm. There is no hydronephrosis on either side. The visualized ureters appear unremarkable. The urinary bladder is decompressed around a Foley catheter. A 4 mm calcific density along the right bladder wall is similar to the prior study. Enlarged prostate gland measuring up to 6.5 cm in diameter.  Evaluation of the bowel is very limited in the absence of oral contrast. 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 There is fluid distention of the rectal pouch which has increased in size and more distended compared to the prior study. There is haziness of the fat surrounding the upper border of the rectal pouch with small pockets of air concerning for sutural dehiscence. Multiple decompressed loops of small bowel noted within the abdomen and pelvis. A 15 x 12 cm loculated fluid collection noted in the left hemipelvis containing air and fluid. There has been interval increase in the size of this collection with significant increase in the amount of gas within this collection concerning for developing infection. An ill-defined apparent track like structure may be present extending from this collection to the rectal pouch concerning for developing fistula. There is extension of the gas into the left anterior abdominal musculature adjacent and superior to the loculated peritoneal collection. A 17 x 9 cm loculated fluid collection is noted in the right anterior peritoneum superior to the ileostomy.  Mild aortoiliac atherosclerotic disease. There is no lymphadenopathy. There is a small fat containing left inguinal hernia. Midline vertical anterior abdominal wall incisional scar. Degenerative changes of the spine. No acute fracture.  IMPRESSION: Postsurgical changes of colectomy and right lower quadrant ileostomy. No evidence of bowel obstruction. There has been interval increase in the size of  fluid field rectal pouch. Small pockets of air superior to the pouch are concerning for suture dehiscence. Clinical correlation and surgical consult is advised.  Increase in the size of the left hemipelvic peritoneal collection with significant interval increase in the size of the gas within this collection concerning for superimposed infection. An ill-defined track like structure extending from this collection to the rectal pouch is concerning for developing fistula. There is extension of gas in the left anterior abdominal musculature superior to this collection.  Loculated peritoneal fluid collection in the right anterior hemiabdomen superior to the ileostomy.  Small bilateral pleural effusions with bibasilar subsegmental atelectasis/pneumonia.  These results were called by telephone at the time of interpretation on 07/21/2015 at 3:52 am to the charge nurse, Christell Constant, who verbally acknowledged these results.   Electronically Signed   By: Elgie Collard M.D.   On: 07/21/2015 03:55   US Renal Port  07/20/2015   CLINICAL DATA:  Renal failure. Patient on vent. CT congestive heart failure.  EXAM: RENAL / URINARY TRACT ULTRASOUND COMPLETE  COMPARISON:  CT 812 1,016  FINDINGS: Right Kidney:  Length: 11.7 cm.  Normal cortical thickness.  No hydronephrosis.  Left Kidney:  Length: 10.8 cm.  Normal cortical thickness.  No hydronephrosis.  Bladder:  Appears normal for degree of bladder distention.  There several fluid collections within  the peritoneal space. These collections have lacy internal echoes consistent with a complex fluid collections. The largest in LEFT upper quadrant and aright which enters 19 x 10 x 17 cm.  IMPRESSION: 1. No hydronephrosis. 2. Multiple complex fluid collections within the peritoneal space. There was fluid within the peritoneal space on comparison CT however the collection appear larger and have internal complexity suggesting peritonitis or abscess. Recommend CT of the abdomen and pelvis with  contrast (if possible) for further evaluation. Findings conveyed tocharge nurse Brie on 07/20/2015 at21:44. Attempting to contact attending physician.   Electronically Signed   By: Genevive Bi M.D.   On: 07/20/2015 21:49   Dg Chest Port 1 View  07/24/2015   CLINICAL DATA:  Sepsis.  EXAM: PORTABLE CHEST - 1 VIEW  COMPARISON:  07/23/2015.  FINDINGS: Interim placement of feeding tube, its tip is below left hemidiaphragm. Tracheostomy tube and left IJ line in stable position. Prior CABG. Stable cardiomegaly. Low lung volumes with mild subsegmental atelectasis and/or infiltrates in the left mid lung and both lung bases. Small left pleural effusion cannot be excluded. No pneumothorax.  IMPRESSION: 1. Interim placement of feeding tube is tip is below the left hemidiaphragm. Remaining lines and tubes in stable position.  2. Low lung volumes with left mid lung and bibasilar subsegmental atelectasis and/or infiltrates. Small left pleural effusion cannot be excluded.  3.  Prior CABG.  Cardiomegaly.  No pulmonary venous congestion.   Electronically Signed   By: Maisie Fus  Register   On: 07/24/2015 07:10   Dg Chest Port 1 View  07/23/2015   CLINICAL DATA:  Acute respiratory failure.  Replaced trach.  EXAM: PORTABLE CHEST - 1 VIEW  COMPARISON:  07/23/2015  FINDINGS: Tracheostomy tube has been placed, tip approximately 4 cm above the carina. The tip is difficult to visualize likely secondary to overlying sternotomy wires. Left IJ central line tip overlies the level of the superior vena cava.  The heart is enlarged. Status post median sternotomy and CABG. There is increase dense opacity at the medial left lung base which obscures the hemidiaphragm and is consistent with atelectasis or consolidation. Subsegmental atelectasis is also noted bilaterally.  IMPRESSION: 1. Interval placement of tracheostomy tube. 2. Increased opacity at the left lung base consistent was consolidation or increased atelectasis.   Electronically Signed    By: Norva Pavlov M.D.   On: 07/23/2015 14:15   Dg Chest Port 1 View  07/23/2015   CLINICAL DATA:  Septic shock.  EXAM: PORTABLE CHEST - 1 VIEW  COMPARISON:  07/22/2015  FINDINGS: Endotracheal tube, left internal jugular venous catheter, with tip projecting over the junction of the brachiocephalic veins, and enteric catheter with tip collimated off the image, are stable. Median sternotomy wires and postsurgical changes from CABG are also stable.  Cardiomediastinal silhouette is stably enlarged. Mediastinal contours appear intact.  There is no evidence of focal airspace consolidation, pleural effusion or pneumothorax. There are low lung volumes with bibasilar atelectasis.  Osseous structures are without acute abnormality. Soft tissues are grossly normal.  IMPRESSION: Low lung volumes with bibasilar atelectasis.  Stable appearance of the supporting lines and tubes.   Electronically Signed   By: Ted Mcalpine M.D.   On: 07/23/2015 07:24   Dg Chest Port 1 View  07/22/2015   CLINICAL DATA:  Septic shock, respiratory failure, intubated patient.  EXAM: PORTABLE CHEST - 1 VIEW  COMPARISON:  Portable chest x-ray of July 21, 2015  FINDINGS: The lungs are adequately inflated. Increased bibasilar  atelectasis is present. The cardiac silhouette remains enlarged. The central pulmonary vascularity is mildly prominent. The endotracheal tube tip lies 4.4 cm above the carina. The left internal jugular venous catheter tip projects at junction of the right and left brachiocephalic veins. The right internal jugular catheter is been removed. The esophagogastric tube tip projects below the inferior margin of the image.  IMPRESSION: Worsening bibasilar atelectasis. The support tubes are in reasonable position.   Electronically Signed   By: David  [place] M.D.   On: 07/22/2015 07:02   Dg Chest Port 1 View  07/21/2015   CLINICAL DATA:  Central catheter placement  EXAM: PORTABLE CHEST - 1 VIEW  COMPARISON:  Study obtained  earlier in the day  FINDINGS: New central catheter is present with tip in the superior vena cava. A second central catheter from the right side extends into the right neck region, stable compared to earlier in the day. Endotracheal tube tip is 3.4 cm above the carina. Nasogastric tube tip and side port are in the region of the distal stomach. No pneumothorax. There is patchy bibasilar atelectasis, slightly less than earlier today. Lungs elsewhere clear. Heart is upper normal in size with pulmonary vascularity within normal limits. There is no adenopathy. Patient is status post coronary artery bypass grafting.  IMPRESSION: Tube and catheter positions as described without pneumothorax. Note that the right central catheter extends into the right neck region with the tip not seen.  There is patchy bibasilar atelectasis, slightly less compared to earlier in the day. No change in cardiac silhouette.   Electronically Signed   By: Bretta Bang III M.D.   On: 07/21/2015 11:31   Dg Chest Port 1 View  07/21/2015   CLINICAL DATA:  Respiratory failure, congestive heart failure portable chest x-ray of 07/20/2015  EXAM: PORTABLE CHEST - 1 VIEW  COMPARISON:  None.  FINDINGS: The tip of the endotracheal tube is approximately 4.4 cm above the carina. There has been slight worsening of bibasilar in linear atelectasis. Mild cardiomegaly is stable. The right central venous line extends cephalad, probably within the right internal jugular vein, unchanged. The tip is not visualized.  IMPRESSION: 1. Diminished aeration with increasing bibasilar atelectasis. 2. Tip of endotracheal tube approximately 4.4 cm above the carina. 3. As before, the right IJ central venous line extends into the neck.   Electronically Signed   By: Dwyane Dee M.D.   On: 07/21/2015 08:13   Dg Chest Port 1 View  07/20/2015   CLINICAL DATA:  Endotracheal tube and NG tube placement  EXAM: PORTABLE CHEST - 1 VIEW  COMPARISON:  07/19/2015  FINDINGS: There is  endotracheal tube with the tip at the right mainstem bronchus; recommend retracting the endotracheal tube 2.5 cm.  There is mild bilateral interstitial prominence. There is left basilar linear airspace disease likely reflecting atelectasis. There is no pleural effusion or pneumothorax. There is stable mild cardiomegaly. There is evidence of prior CABG.  The osseous structures are unremarkable.  IMPRESSION: 1. Endotracheal tube with the tip at the right mainstem bronchus; recommend retracting the endotracheal tube 2.5 cm.   Electronically Signed   By: Elige Ko   On: 07/20/2015 18:57   Dg Abd Portable 1v  07/23/2015   CLINICAL DATA:  NG tube placement.  EXAM: PORTABLE ABDOMEN - 1 VIEW  COMPARISON:  CT exam 07/21/2015  FINDINGS: Nasogastric tube has been removed. A feeding tube has been placed, tip overlying the level of the distal stomach. Visualized bowel gas pattern  is nonobstructed. Pigtail type catheters are identified in the right and left lower quadrants.  IMPRESSION: Interval placement of feeding tube tip overlying the level of the distal stomach.   Electronically Signed   By: Norva Pavlov M.D.   On: 07/23/2015 16:15   Dg Abd Portable 1v  07/21/2015   CLINICAL DATA:  Orogastric tube placement  EXAM: PORTABLE ABDOMEN - 1 VIEW  COMPARISON:  None.  FINDINGS: The orogastric tube is not well seen. There is a tube along the superior aspect of the image with what appears to be a side-port slightly to the right of midline suggesting that the orogastric tube tip and side port are in the distal stomach. Bowel gas pattern unremarkable. There is degenerative change in the lumbar spine.  IMPRESSION: Orogastric tube less than optimally seen. Suspect tube tip and side port in distal stomach, although a repeat study centered over the upper abdomen is advised to confirm this observation. Bowel gas pattern which is visualized appears unremarkable.   Electronically Signed   By: Bretta Bang III M.D.   On:  07/21/2015 08:05   Dg Abd Portable 1v  07/20/2015   CLINICAL DATA:  Nasogastric tube placement.  EXAM: PORTABLE ABDOMEN - 1 VIEW  COMPARISON:  07/03/2015  FINDINGS: Nasogastric tube is not visible despite the history. Endotracheal tube is in place, tip approximately 2 cm above carina. There are coarse markings in the lungs.  IMPRESSION: No enteric tube identified.   Electronically Signed   By: Norva Pavlov M.D.   On: 07/20/2015 18:15   Ct Image Guided Drainage By Percutaneous Catheter  07/21/2015   CLINICAL DATA:  Right and left abdominal abscess is  EXAM: CT IMAGE GUIDED DRAINAGE BY PERCUTANEOUS CATHETER  FLUOROSCOPY TIME:  None  MEDICATIONS AND MEDICAL HISTORY: Patient is intubated and sedated  ANESTHESIA/SEDATION: Not applicable  CONTRAST:  None  PROCEDURE: The procedure, risks, benefits, and alternatives were explained to the patient. Questions regarding the procedure were encouraged and answered. The patient understands and consents to the procedure.  The left abdomen was prepped with Betadine in a sterile fashion, and a sterile drape was applied covering the operative field. A sterile gown and sterile gloves were used for the procedure.  Under CT guidance, an 18 gauge needle was inserted into the left abdominal fluid collection. After aspirating serosanguineous fluid, the needle was removed over an Amplatz wire. A 12 French dilator followed by a 12 [place] drain was inserted. The drain was looped and string fixed in the fluid collection. It was sewn to the skin then attached to a gravity drainage bag.  The identical procedure was performed in the right abdomen for the right abdominal fluid collection. This yielded cloudy yellowish fluid.  FINDINGS: Images demonstrate 38 French drain placement into right and left abdominal fluid collections as described.  COMPLICATIONS: None  IMPRESSION: Bilateral abdominal abscess strain as described above yielding serosanguineous fluid from the left abdominal fluid  collection and cloudy yellow fluid on the right. Bilateral 12 French drains were utilized.   Electronically Signed   By: Jolaine Click M.D.   On: 07/21/2015 15:06   Ct Image Guided Drainage By Percutaneous Catheter  07/21/2015   CLINICAL DATA:  Right and left abdominal abscess is  EXAM: CT IMAGE GUIDED DRAINAGE BY PERCUTANEOUS CATHETER  FLUOROSCOPY TIME:  None  MEDICATIONS AND MEDICAL HISTORY: Patient is intubated and sedated  ANESTHESIA/SEDATION: Not applicable  CONTRAST:  None  PROCEDURE: The procedure, risks, benefits, and alternatives were explained to  the patient. Questions regarding the procedure were encouraged and answered. The patient understands and consents to the procedure.  The left abdomen was prepped with Betadine in a sterile fashion, and a sterile drape was applied covering the operative field. A sterile gown and sterile gloves were used for the procedure.  Under CT guidance, an 18 gauge needle was inserted into the left abdominal fluid collection. After aspirating serosanguineous fluid, the needle was removed over an Amplatz wire. A 12 French dilator followed by a 12 [place] drain was inserted. The drain was looped and string fixed in the fluid collection. It was sewn to the skin then attached to a gravity drainage bag.  The identical procedure was performed in the right abdomen for the right abdominal fluid collection. This yielded cloudy yellowish fluid.  FINDINGS: Images demonstrate 87 French drain placement into right and left abdominal fluid collections as described.  COMPLICATIONS: None  IMPRESSION: Bilateral abdominal abscess strain as described above yielding serosanguineous fluid from the left abdominal fluid collection and cloudy yellow fluid on the right. Bilateral 12 French drains were utilized.   Electronically Signed   By: Jolaine Click M.D.   On: 07/21/2015 15:06    Labs:  CBC:  Recent Labs  07/22/15 0001 07/22/15 0445 07/22/15 1115 07/24/15 0345  WBC 30.3* 20.7* 19.3*  11.6*  HGB 10.9* 10.7* 9.8* 9.1*  HCT 37.5* 35.6* 31.0* 28.6*  PLT 236 161 124* 73*    COAGS:  Recent Labs  07/07/15 1003 07/08/15 0515 07/21/15 0815 07/21/15 1740 07/23/15 1200  INR 1.48 1.49 1.65* 1.95* 2.62*  APTT 26 29 29   --  38*    BMP:  Recent Labs  07/22/15 0445 07/22/15 1115 07/23/15 0600 07/24/15 0345  NA 145 144 144 147*  K 4.5 3.8 3.5 2.8*  CL 123* 123* 115* 116*  CO2 12* 13* 16* 22  GLUCOSE 175* 195* 156* 158*  BUN 75* 75* 75* 70*  CALCIUM 7.7* 7.3* 6.9* 7.0*  CREATININE 2.12* 1.88* 1.86* 1.37*  GFRNONAA 30* 35* 35* 51*  GFRAA 35* 40* 41* 59*    LIVER FUNCTION TESTS:  Recent Labs  07/21/15 0405 07/22/15 0445 07/23/15 0600 07/24/15 0345  BILITOT 0.9 1.0 1.0 0.7  AST 27 33 30 40  ALT 22 19 15* 15*  ALKPHOS 136* 151* 154* 167*  PROT 5.3* 4.9* 4.6* 4.3*  ALBUMIN 1.2* 1.0* <1.0* <1.0*    Assessment and Plan:  B abd abscesses  drains in place Draining well +enterococcus (L) Will follow   Signed: Courtland Coppa A 07/24/2015, 3:45 PM   I spent a total of 15 Minutes at the the patient's bedside AND on the patient's hospital floor or unit, greater than 50% of which was counseling/coordinating care for abscess drains

## 2015-07-24 NOTE — Consult Note (Signed)
WOC wound follow up Wound type:  Midline surgical wound Measurement: did not measure wound today Wound bed: beefy red, 100% granulation tissue Drainage (amount, consistency, odor) minimal, serous in canister Periwound: intact  Dressing procedure/placement/frequency: 1pc of black foam used to cover the wound bed, draped and sealed at 125 mmHG.  Pt tolerated well, sedated/intubated.   Noted new Panda tube in place in the left nare, will need to have staff check site each shift since this area was affected with pressure ulcer at the time of his previous admission.  Area is healing well. Continue Vaseline Q shift.   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   WOC ostomy follow up Stoma type/location: RLQ, end ileostomy Stomal assessment/size: 1 3/8" oval shaped  Peristomal assessment: intact  Treatment options for stomal/peristomal skin: none  Output liquid, brown/dark Ostomy pouching: 2pc. 2 3/4" used today, may need to add 2" barrier ring due to volume of output. Education provided: pt sedated and intubated, no family at the bedside.  Enrolled patient in [place] Secure Start Discharge program: No  WOC will follow along for weekly wound assessments, support with VAC dressings as needed, and ostomy care.  [name] [place] RN,CWOCN [phone]

## 2015-07-24 NOTE — Progress Notes (Signed)
ANTIBIOTIC CONSULT NOTE - FOLLOW UP  Pharmacy Consult:  Vancomycin Indication:  Enterococcal abdominal abscess  Allergies  Allergen Reactions  . Levaquin [Levofloxacin] Nausea Only  . Penicillins Hives  . Doxycycline Rash    Patient Measurements: Weight: 293 lb 10.4 oz (133.2 kg)  Vital Signs: Temp: 97.4 F (36.3 C) (09/09 0835) Temp Source: Oral (09/09 0835) BP: 116/48 mmHg (09/09 0819) Pulse Rate: 74 (09/09 0819) Intake/Output from previous day: 09/08 0701 - 09/09 0700 In: 4967.7 [I.V.:2555.1; NG/GT:1032.7; IV Piggyback:1380] Out: 1065 [Urine:790; Drains:25; Stool:250] Intake/Output from this shift: Total I/O In: 138.3 [I.V.:88.3; NG/GT:50] Out: -   Labs:  Recent Labs  07/22/15 0445 07/22/15 1115 07/23/15 0600 07/24/15 0345  WBC 20.7* 19.3*  --  11.6*  HGB 10.7* 9.8*  --  9.1*  PLT 161 124*  --  73*  CREATININE 2.12* 1.88* 1.86* 1.37*   Estimated Creatinine Clearance: 68.9 mL/min (by C-G formula based on Cr of 1.37). No results for input(s): VANCOTROUGH, VANCOPEAK, VANCORANDOM, GENTTROUGH, GENTPEAK, GENTRANDOM, TOBRATROUGH, TOBRAPEAK, TOBRARND, AMIKACINPEAK, AMIKACINTROU, AMIKACIN in the last 72 hours.   Microbiology: Recent Results (from the past 720 hour(s))  Culture, respiratory (NON-Expectorated)     Status: None   Collection Time: 06/30/15  5:45 PM  Result Value Ref Range Status   Specimen Description TRACHEAL ASPIRATE  Final   Special Requests Normal  Final   Gram Stain   Final    FEW WBC PRESENT,BOTH PMN AND MONONUCLEAR RARE SQUAMOUS EPITHELIAL CELLS PRESENT RARE GRAM POSITIVE COCCI IN PAIRS Performed at [facility]    Culture   Final    Non-Pathogenic Oropharyngeal-type Flora Isolated. Performed at [facility]    Report Status 07/02/2015 FINAL  Final  Culture, blood (single)     Status: None   Collection Time: 07/02/15  9:53 AM  Result Value Ref Range Status   Specimen Description BLOOD LEFT HAND  Final   Special  Requests BOTTLES DRAWN AEROBIC ONLY 5CC  Final   Culture NO GROWTH 5 DAYS  Final   Report Status 07/07/2015 FINAL  Final  Culture, respiratory (NON-Expectorated)     Status: None   Collection Time: 07/05/15  4:25 PM  Result Value Ref Range Status   Specimen Description TRACHEAL ASPIRATE  Final   Special Requests NONE  Final   Gram Stain   Final    FEW WBC PRESENT, PREDOMINANTLY PMN RARE SQUAMOUS EPITHELIAL CELLS PRESENT FEW GRAM POSITIVE COCCI IN PAIRS RARE GRAM NEGATIVE RODS Performed at [facility]    Culture   Final    MODERATE GROUP B STREP(S.AGALACTIAE)ISOLATED Note: TESTING AGAINST S. AGALACTIAE NOT ROUTINELY PERFORMED DUE TO PREDICTABILITY OF AMP/PEN/VAN SUSCEPTIBILITY. Performed at [facility]    Report Status 07/08/2015 FINAL  Final  Culture, blood (routine x 2)     Status: None   Collection Time: 07/05/15  6:52 PM  Result Value Ref Range Status   Specimen Description BLOOD LEFT ANTECUBITAL  Final   Special Requests   Final    BOTTLES DRAWN AEROBIC AND ANAEROBIC 10CC BLUE 7CC RED   Culture NO GROWTH 5 DAYS  Final   Report Status 07/10/2015 FINAL  Final  Culture, blood (routine x 2)     Status: None   Collection Time: 07/05/15  7:00 PM  Result Value Ref Range Status   Specimen Description BLOOD LEFT WRIST  Final   Special Requests BOTTLES DRAWN AEROBIC AND ANAEROBIC 10CC  Final   Culture NO GROWTH 5 DAYS  Final  Report Status 07/10/2015 FINAL  Final  MRSA PCR Screening     Status: None   Collection Time: 07/07/15  4:05 PM  Result Value Ref Range Status   MRSA by PCR NEGATIVE NEGATIVE Final    Comment:        The GeneXpert MRSA Assay (FDA approved for NASAL specimens only), is one component of a comprehensive MRSA colonization surveillance program. It is not intended to diagnose MRSA infection nor to guide or monitor treatment for MRSA infections.   Culture, blood (routine x 2)     Status: None (Preliminary result)   Collection Time:  07/19/15 10:18 PM  Result Value Ref Range Status   Specimen Description BLOOD PICC LINE  Final   Special Requests BOTTLES DRAWN AEROBIC AND ANAEROBIC 10CC  Final   Culture NO GROWTH 4 DAYS  Final   Report Status PENDING  Incomplete  Culture, blood (routine x 2)     Status: None (Preliminary result)   Collection Time: 07/19/15 10:19 PM  Result Value Ref Range Status   Specimen Description BLOOD LEFT HAND  Final   Special Requests BOTTLES DRAWN AEROBIC AND ANAEROBIC 5CC  Final   Culture NO GROWTH 4 DAYS  Final   Report Status PENDING  Incomplete  Culture, Urine     Status: None   Collection Time: 07/19/15 11:10 PM  Result Value Ref Range Status   Specimen Description URINE, RANDOM  Final   Special Requests NONE  Final   Culture NO GROWTH 1 DAY  Final   Report Status 07/21/2015 FINAL  Final  MRSA PCR Screening     Status: None   Collection Time: 07/21/15  3:01 AM  Result Value Ref Range Status   MRSA by PCR NEGATIVE NEGATIVE Final    Comment:        The GeneXpert MRSA Assay (FDA approved for NASAL specimens only), is one component of a comprehensive MRSA colonization surveillance program. It is not intended to diagnose MRSA infection nor to guide or monitor treatment for MRSA infections.   Culture, respiratory (NON-Expectorated)     Status: None (Preliminary result)   Collection Time: 07/21/15  6:44 AM  Result Value Ref Range Status   Specimen Description TRACHEAL ASPIRATE  Final   Special Requests NONE  Final   Gram Stain   Final    FEW WBC PRESENT,BOTH PMN AND MONONUCLEAR RARE SQUAMOUS EPITHELIAL CELLS PRESENT RARE GRAM POSITIVE RODS RARE GRAM POSITIVE COCCI IN PAIRS YEAST    Culture   Final    Non-Pathogenic Oropharyngeal-type Flora Isolated. Performed at [facility]    Report Status PENDING  Incomplete  Culture, routine-abscess     Status: None (Preliminary result)   Collection Time: 07/21/15  5:45 PM  Result Value Ref Range Status   Specimen  Description ABSCESS RIGHT ABDOMEN  Final   Special Requests NONE  Final   Gram Stain   Final    FEW WBC PRESENT,BOTH PMN AND MONONUCLEAR NO SQUAMOUS EPITHELIAL CELLS SEEN NO ORGANISMS SEEN Performed at [facility]    Culture   Final    NO GROWTH 2 DAYS Performed at [facility]    Report Status PENDING  Incomplete  Culture, routine-abscess     Status: None (Preliminary result)   Collection Time: 07/21/15  5:56 PM  Result Value Ref Range Status   Specimen Description ABSCESS LEFT ABDOMEN  Final   Special Requests NONE  Final   Gram Stain   Final  FEW WBC PRESENT,BOTH PMN AND MONONUCLEAR NO SQUAMOUS EPITHELIAL CELLS SEEN ABUNDANT GRAM POSITIVE COCCI IN PAIRS IN CLUSTERS FEW GRAM NEGATIVE RODS Performed at [facility]    Culture   Final    MODERATE ENTEROCOCCUS SPECIES Performed at [facility]    Report Status PENDING  Incomplete      Assessment: 52 YOM with Enterococcal abdominal abscess to adjust antibiotics to vancomycin as it is not VRE.  He continues on Araxis.  Patient's renal function is improving.  Primaxin 9/6 >> 9/9 Vanc 9/6 >> 9/8, resumed 9/9 >> Zyvox 9/8 >> 9/9 Anidula 9/6 >> (9/12)  9/6 TA - GPC/GPR/yeast on Gram stain 9/6 left abd abscess - Enterococcus (pan sensitive) 9/6 right abd abscess - NGTD   Goal of Therapy:  Vanc trough ~15 mcg/mL   Plan:  - Vanc  IV Q24H - Anidulafungin  IV Q24H per MD - Monitor renal fxn, clinical progress, vanc trough as indicated    Li Bobo D. Laney Potash, PharmD, BCPS Pager:  [phone] 07/24/2015, 12:18 PM

## 2015-07-24 NOTE — Progress Notes (Signed)
Subjective: On vent  Objective: Vital signs in last 24 hours: Temp:  [97.3 F (36.3 C)-98.5 F (36.9 C)] 97.4 F (36.3 C) (09/09 0835) Pulse Rate:  [50-111] 74 (09/09 0819) Resp:  [19-35] 33 (09/09 0819) BP: (84-127)/(40-59) 116/48 mmHg (09/09 0819) SpO2:  [93 %-100 %] 100 % (09/09 0819) Arterial Line BP: (82-139)/(35-60) 82/35 mmHg (09/09 0800) FiO2 (%):  [40 %] 40 % (09/09 0819) Weight:  [133.2 kg (293 lb 10.4 oz)] 133.2 kg (293 lb 10.4 oz) (09/08 0928) Last BM Date: 07/21/15  Intake/Output from previous day: 09/08 0701 - 09/09 0700 In: 4967.7 [I.V.:2555.1; NG/GT:1032.7; IV Piggyback:1380] Out: 1065 [Urine:790; Drains:25; Stool:250] Intake/Output this shift: Total I/O In: 138.3 [I.V.:88.3; NG/GT:50] Out: -   General appearance: no distress Resp: clear to auscultation bilaterally GI: midline vac, drains with low output, nontender  Lab Results:   Recent Labs  07/22/15 1115 07/24/15 0345  WBC 19.3* 11.6*  HGB 9.8* 9.1*  HCT 31.0* 28.6*  PLT 124* 73*   BMET  Recent Labs  07/23/15 0600 07/24/15 0345  NA 144 147*  K 3.5 2.8*  CL 115* 116*  CO2 16* 22  GLUCOSE 156* 158*  BUN 75* 70*  CREATININE 1.86* 1.37*  CALCIUM 6.9* 7.0*   PT/INR  Recent Labs  07/21/15 1740 07/23/15 1200  LABPROT 22.2* 27.6*  INR 1.95* 2.62*   ABG  Recent Labs  07/23/15 0918 07/23/15 1042  PHART 7.486* 7.425  HCO3 17.6* 15.9*    Studies/Results: Dg Chest Port 1 View  07/24/2015   CLINICAL DATA:  Sepsis.  EXAM: PORTABLE CHEST - 1 VIEW  COMPARISON:  07/23/2015.  FINDINGS: Interim placement of feeding tube, its tip is below left hemidiaphragm. Tracheostomy tube and left IJ line in stable position. Prior CABG. Stable cardiomegaly. Low lung volumes with mild subsegmental atelectasis and/or infiltrates in the left mid lung and both lung bases. Small left pleural effusion cannot be excluded. No pneumothorax.  IMPRESSION: 1. Interim placement of feeding tube is tip is below the  left hemidiaphragm. Remaining lines and tubes in stable position.  2. Low lung volumes with left mid lung and bibasilar subsegmental atelectasis and/or infiltrates. Small left pleural effusion cannot be excluded.  3.  Prior CABG.  Cardiomegaly.  No pulmonary venous congestion.   Electronically Signed   By: Maisie Fus  Register   On: 07/24/2015 07:10   Dg Chest Port 1 View  07/23/2015   CLINICAL DATA:  Acute respiratory failure.  Replaced trach.  EXAM: PORTABLE CHEST - 1 VIEW  COMPARISON:  07/23/2015  FINDINGS: Tracheostomy tube has been placed, tip approximately 4 cm above the carina. The tip is difficult to visualize likely secondary to overlying sternotomy wires. Left IJ central line tip overlies the level of the superior vena cava.  The heart is enlarged. Status post median sternotomy and CABG. There is increase dense opacity at the medial left lung base which obscures the hemidiaphragm and is consistent with atelectasis or consolidation. Subsegmental atelectasis is also noted bilaterally.  IMPRESSION: 1. Interval placement of tracheostomy tube. 2. Increased opacity at the left lung base consistent was consolidation or increased atelectasis.   Electronically Signed   By: Norva Pavlov M.D.   On: 07/23/2015 14:15   Dg Chest Port 1 View  07/23/2015   CLINICAL DATA:  Septic shock.  EXAM: PORTABLE CHEST - 1 VIEW  COMPARISON:  07/22/2015  FINDINGS: Endotracheal tube, left internal jugular venous catheter, with tip projecting over the junction of the brachiocephalic veins, and enteric catheter  with tip collimated off the image, are stable. Median sternotomy wires and postsurgical changes from CABG are also stable.  Cardiomediastinal silhouette is stably enlarged. Mediastinal contours appear intact.  There is no evidence of focal airspace consolidation, pleural effusion or pneumothorax. There are low lung volumes with bibasilar atelectasis.  Osseous structures are without acute abnormality. Soft tissues are grossly  normal.  IMPRESSION: Low lung volumes with bibasilar atelectasis.  Stable appearance of the supporting lines and tubes.   Electronically Signed   By: Ted Mcalpine M.D.   On: 07/23/2015 07:24   Dg Abd Portable 1v  07/23/2015   CLINICAL DATA:  NG tube placement.  EXAM: PORTABLE ABDOMEN - 1 VIEW  COMPARISON:  CT exam 07/21/2015  FINDINGS: Nasogastric tube has been removed. A feeding tube has been placed, tip overlying the level of the distal stomach. Visualized bowel gas pattern is nonobstructed. Pigtail type catheters are identified in the right and left lower quadrants.  IMPRESSION: Interval placement of feeding tube tip overlying the level of the distal stomach.   Electronically Signed   By: Norva Pavlov M.D.   On: 07/23/2015 16:15    Anti-infectives: Anti-infectives    Start     Dose/Rate Route Frequency Ordered Stop   07/23/15 1000  linezolid (ZYVOX) IVPB 600 mg     600 mg 300 mL/hr over 60 Minutes Intravenous Every 12 hours 07/23/15 0917     07/22/15 1000  anidulafungin (ERAXIS) 100 mg in sodium chloride 0.9 % 100 mL IVPB     100 mg over 90 Minutes Intravenous Every 24 hours 07/21/15 0959     07/21/15 1800  imipenem-cilastatin (PRIMAXIN) 250 mg in sodium chloride 0.9 % 100 mL IVPB     250 mg 200 mL/hr over 30 Minutes Intravenous 4 times per day 07/21/15 1401     07/21/15 1200  imipenem-cilastatin (PRIMAXIN) 250 mg in sodium chloride 0.9 % 100 mL IVPB  Status:  Discontinued     250 mg 200 mL/hr over 30 Minutes Intravenous Every 12 hours 07/21/15 0200 07/21/15 1401   07/21/15 1100  vancomycin (VANCOCIN) 1,250 mg in sodium chloride 0.9 % 250 mL IVPB  Status:  Discontinued     1,250 mg 166.7 mL/hr over 90 Minutes Intravenous Every 24 hours 07/21/15 1006 07/23/15 0917   07/21/15 1000  anidulafungin (ERAXIS) 200 mg in sodium chloride 0.9 % 200 mL IVPB     200 mg over 180 Minutes Intravenous  Once 07/21/15 0959 07/21/15 1825   07/21/15 0230  vancomycin (VANCOCIN) IVPB 1000 mg/200 mL  premix     1,000 mg 200 mL/hr over 60 Minutes Intravenous  Once 07/21/15 0200 07/21/15 0334   07/21/15 0230  imipenem-cilastatin (PRIMAXIN) 500 mg in sodium chloride 0.9 % 100 mL IVPB     500 mg 200 mL/hr over 30 Minutes Intravenous  Once 07/21/15 0200 07/21/15 0303      Assessment/Plan:  S/P abdominal colectomy for necrotic colon 8/4 S/P washout and closure 8/6 Severe sepsis due to intra-abdominal abscesses  S/P perc drain X 2 - working well. Repeat CT once drainage down per IR.  Drainage from rectum likely from rectal stump breakdown with peritoneal fluid leaking out - no problem as   fecal stream is diverted  Improving a lot with excellent CCM care FEN - tolerating TF at goal  LOS: 3 days    Halei Hanover E 07/24/2015

## 2015-07-24 NOTE — Progress Notes (Signed)
eLink Physician-Brief Progress Note Patient Name: Jonathan Le DOB: 19-May-1946 MRN: [id number]   Date of Service  07/24/2015  HPI/Events of Note    eICU Interventions  Potassium given     Intervention Category Intermediate Interventions: Electrolyte abnormality - evaluation and management  [name] S. 07/24/2015, 5:35 AM

## 2015-07-24 NOTE — Discharge Summary (Signed)
Physician Discharge Summary       Patient ID: Jonathan Le MRN: [id number] DOB/AGE: November 15, 1945 69 y.o.  Admit date: 07/21/2015 Discharge date: 07/25/2015  Discharge Diagnoses:  Tracheostomy Dependence  Ventilator dependence  OSA. Aspiration PNA. Septic shock - intraabdominal abscess Ischemic cardiomyopathy - hx CABG on 7/16 Refractory shock/ persistent hypotension  mal placed picc removed 9/6 AKI  Mild hypernatremia. Non Anion Gap metabolic acidosis. 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 Severe protein Calorie Malnutrition  Breakdown / necrosis rectum Anemia - of critical illness  VTE prophylaxis. Thrombocytopenia Coagulopathy  Hypokalemia  R LE ulcer, chronic. Hyperglycemia without hx of DM. Acute Metabolic encephalopathy. Pain, Anxiety. Generalized deconditioning.  Detailed Hospital Course:  69 year old male with PMH of OSA and HTN. 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 Post discharge course complicated by R leg cellulitis after endoscopic vein harvest he was seen by Dr. Donata Clay in clinic for this,was having some constipation. 8/4 PM he took an enema and was straining in bathroom. He became diaphoretic and had loss of consciousness. In ED he was noted to be profoundly hypotensive with SOB. Found to be in severe septic shock with presumed ischemic colon. He was then taken to the OR emergently by Dr. Dwain Sarna and had abdominal colectomy 8/4. Returned to OR 8/6 for abdominal closure with ileostomy. Post operatively, he self extubated on 8/8. He required re-intubation and on 8/19, he subsequently had tracheostomy performed by Dr. Pollyann Kennedy of ENT.  On 8/24, he was discharged to [facility]. While on [facility], he self de-cannulated at some point and on 9/5, he had a suspected  aspiration event and developed respiratory failure. PCCM was consulted and pt was intubated. Later that night, he had ongoing hypotension despite increasing dose of levophed. Renal US earlier in the day revealed findings concerning for peritonitis vs abscess. Pt was transferred to [facility] ICU and CCS was consulted for further recs. His significant hospital events were as follows:   8/4 Total abdominal colectomy with abdominal vac placement 8/6 Exploratory laparotomy, removal of prior packs, closure of open abdomen, ileostomy 8/7 high pressors 8/8 self extubated 8/9 resp distress , required bipap , lasix 8/13 had to restart levophed for shock overnight. 8/16 reintubated, primarily for MS, agitation, tachypnea 8/19 Trach placed 8/22 tolerating TC. 8/23 to [facility] 9/05 PCCM called back for aspiration PNA and respiratory failure. Pt reintubated. Later transferred to [facility] ICU for worsening septic shock. 9/06 Emergent drainage abscess IR, refractory shock 9/07 Improved pressors 9/08 Trach placed 9/10 Cleared for transfer back to [facility] for rehab and weaning efforts.     Discharge Plan by active problems   Trach/ventilator dependence - reintubated 9/5 for aspiration and inability to protect airway. Trach re-do 9/8 OSA.  Plan:  Wean per [facility] protocol Trach care daily with inner cannula change and PRN  Septic shock - abdominal abscess- enterococcal (amp sensitive)  Plan:  Continue Vanc (started 9/6) Aningulofungin 9/7 plan for 7 days  Continue current abscess drain management per interventional radiology Consider Repeat CT abd when drainage decreased (per guidance of IR) Rectal drainage likely from rectal stump breakdown with peritoneal fluid leaking out - s/p suppository to stimulate drainage from rectum -per Dr. De Nurse CVL initiated 9/6, consider d/c within 14 days   Ischemic cardiomyopathy - hx CABG  on 7/16 Resolving septic shock w/ Persistent hypotension (pressor dependence)    Plan:  Wean pressors to off for MA  AKI - suspect pre-renal from low flow state / hypoperfusion- resolving Mild hypernatremia. 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 Severe Protein Calorie Malnutrition  Breakdown / necrosis rectum  Plan:  Protonix for SUP TF to goal, appreciate CCS guidance Mayneed PEG in future, not so close to abdo asbcess  Anemia - chronic. Thrombocytopenia  Coagulopathy  Plan:  Transfuse per usual ICU guidelines. SCD's for DVT prophylaxis  Heparin continues to be held CBC in am  Repeat coags in am  Fibrinogen 477 9/6  R LE ulcer, chronic. Plan  Cont routine wound care    Hyperglycemia without hx of DM. Plan: SSI.  Acute Metabolic encephalopathy. Pain, Anxiety. Generalized deconditioning. Plan:  RASS goal: 0 Daily WUA Consider CT head assessment 9/11     Consults: general surgery     Significant Hospital tests/ studies  Echo 8/4 >>> EF 25% to 30%. Severe hypokinesis of the anteroseptal and anterior myocardium.Dyskinesis of the apical myocardium. CT chest / abd pelv 8/4 >>>mod to severe cardiomegaly with small pericardial effusion. Pulmonary edema with small pleural effusions. Focal colonic hepatic flexure pneumatosis without perforation. Bilateral nonobstructing nephrolithiasis. CT A / P 8/12 >>> no evidence abscess. Ascites present, GB sludge, small b/l pleural effusions. Renal US 9/5 >>> No hydronephrosis, multiple complex fluid collections within the peritoneal space. Fluid spaces larger with internal complexity suggesting peritonitis or abscess. CT A / P 9/5 >>>large abscess abdo   Discharge Exam: BP 116/60 mmHg  Pulse 86  Temp(Src) 100.7 F (38.2 C) (Oral)  Resp 23  Wt 294 lb 8.6 oz (133.6 kg)  SpO2 98%   General: Adult male, on  vent Neuro: grimaces, opens eyes to voice, sticks tongue out, squeezes on L,  rass -2 Head: left ij, trach clean EENT: PERRL 2 Cardiovascular: RRR, no M/R/G. Lungs: CTA anterior Abdomen: Soft, non-tender, surgical dressing with vac in place, ileostomy in place. Drains with foul smelling output lower Ext: 2+ edema Large swollen scrotum   Labs at discharge Lab Results  Component Value Date   CREATININE 0.98 07/25/2015   BUN 57* 07/25/2015   NA 148* 07/25/2015   K 3.4* 07/25/2015   CL 115* 07/25/2015   CO2 26 07/25/2015   Lab Results  Component Value Date   WBC 10.5 07/25/2015   HGB 8.7* 07/25/2015   HCT 27.3* 07/25/2015   MCV 85.3 07/25/2015   PLT 68* 07/25/2015   Lab Results  Component Value Date   ALT 14* 07/25/2015   AST 35 07/25/2015   ALKPHOS 199* 07/25/2015   BILITOT 0.9 07/25/2015   Lab Results  Component Value Date   INR 1.48 07/25/2015   INR 2.62* 07/23/2015   INR 1.95* 07/21/2015    Current radiology studies Dg Chest Port 1 View  07/24/2015   CLINICAL DATA:  Sepsis.  EXAM: PORTABLE CHEST - 1 VIEW  COMPARISON:  07/23/2015.  FINDINGS: Interim placement of feeding tube, its tip is below left hemidiaphragm. Tracheostomy tube and left IJ line in stable position. Prior CABG. Stable cardiomegaly. Low lung volumes with mild subsegmental atelectasis and/or infiltrates in the left mid lung and both lung bases. Small left pleural effusion cannot be excluded. No pneumothorax.  IMPRESSION: 1. Interim placement of  feeding tube is tip is below the left hemidiaphragm. Remaining lines and tubes in stable position.  2. Low lung volumes with left mid lung and bibasilar subsegmental atelectasis and/or infiltrates. Small left pleural effusion cannot be excluded.  3.  Prior CABG.  Cardiomegaly.  No pulmonary venous congestion.   Electronically Signed   By: Maisie Fus  Register   On: 07/24/2015 07:10    Disposition:  69[facility] Not Defined      Discharge  Instructions    Call MD for:  difficulty breathing, headache or visual disturbances    Complete by:  As directed      Call MD for:  extreme fatigue    Complete by:  As directed      Call MD for:  hives    Complete by:  As directed      Call MD for:  persistant dizziness or light-headedness    Complete by:  As directed      Call MD for:  persistant nausea and vomiting    Complete by:  As directed      Call MD for:  redness, tenderness, or signs of infection (pain, swelling, redness, odor or green/yellow discharge around incision site)    Complete by:  As directed      Call MD for:  severe uncontrolled pain    Complete by:  As directed      Call MD for:  temperature >100.4    Complete by:  As directed      Discharge wound care:    Complete by:  As directed   Wound VAC per WOC RN     Increase activity slowly    Complete by:  As directed             Medication List    STOP taking these medications        aspirin 325 MG EC tablet     atorvastatin 80 MG tablet  Commonly known as:  LIPITOR     collagenase ointment  Commonly known as:  SANTYL     enoxaparin 30 MG/0.3ML injection  Commonly known as:  LOVENOX     finasteride 5 MG tablet  Commonly known as:  PROSCAR     furosemide 40 MG tablet  Commonly known as:  LASIX     multivitamin with minerals Tabs tablet     potassium chloride SA 20 MEQ tablet  Commonly known as:  K-DUR,KLOR-CON     RESOURCE THICKENUP CLEAR Powd     sodium chloride 0.9 % infusion      TAKE these medications        acetaminophen 160 MG/5ML solution  Commonly known as:  TYLENOL  Take 20.3 mLs (650 mg total) by mouth every 6 (six) hours as needed for fever.     anidulafungin 100 mg in sodium chloride 0.9 % 100 mL  Inject 100 mg into the vein daily.     chlorhexidine gluconate 0.12 % solution  Commonly known as:  PERIDEX  15 mLs by Mouth Rinse route 2 (two) times daily.     dextrose 5 % solution  Inject 10 mLs into the vein continuous.       feeding supplement (PRO-STAT SUGAR FREE 64) Liqd  Place 30 mLs into feeding tube 5 (five) times daily.     feeding supplement (VITAL HIGH PROTEIN) Liqd liquid  50 ml/hr     insulin aspart 100 UNIT/ML injection  Commonly known as:  novoLOG  CBG < 70:  implement hypoglycemia protocol CBG 70 - 120: 0 units CBG 121 - 150: 2 units CBG 151 - 200: 3 units CBG 201 - 250: 5 units CBG 251 - 300: 8 units CBG 301 - 350: 11 units CBG 351 - 400: 15 units CBG > 400: call MD and obtain STAT lab verification     norepinephrine 16 mg in dextrose 5 % 250 mL  Inject 2-50 mcg/min into the vein continuous.     pantoprazole sodium 40 mg/20 mL Pack  Commonly known as:  PROTONIX  Place 20 mLs (40 mg total) into feeding tube daily.     vancomycin 1 GM/200ML Soln  Commonly known as:  VANCOCIN  Inject 200 mLs (1,000 mg total) into the vein every 12 (twelve) hours.     white petrolatum Gel  Commonly known as:  VASELINE  Apply 1 application topically daily. Apply to L nare wound daily       Follow-up Information    Follow up with Elizabeth Palau, FNP.   Specialty:  Nurse Practitioner   Why:  Post discharge from hospital    Contact information:   [address] [name] [address] [phone]       Follow up with [facility].   Why:  For Rehab    Contact information:   [address] [place] [address] [phone]      Discharged Condition:  Chronic critical illness, hemodynamically stable.     Canary Brim, NP-C [facility] Pgr: [phone] or if no answer [phone] 07/25/2015, 6:23 PM

## 2015-07-24 NOTE — Care Management Note (Signed)
Case Management Note  Patient Details  Name: Jonathan Le MRN: [id number] Date of Birth: Aug 24, 1946  Subjective/Objective:     From [facility], trached yesterday.  Remains on pressors.                Action/Plan:   Expected Discharge Date:                  Expected Discharge Plan:  Long Term Acute Care (LTAC)  In-House Referral:     Discharge planning Services     Post Acute Care Choice:    Choice offered to:     DME Arranged:    DME Agency:     HH Arranged:    HH Agency:     Status of Service:  In process, will continue to follow  Medicare Important Message Given:    Date Medicare IM Given:    Medicare IM give by:    Date Additional Medicare IM Given:    Additional Medicare Important Message give by:     If discussed at Long Length of Stay Meetings, dates discussed:    Additional Comments:  Vangie Bicker, RN 07/24/2015, 9:14 AM

## 2015-07-25 ENCOUNTER — Inpatient Hospital Stay
Admission: RE | Admit: 2015-07-25 | Discharge: 2015-08-27 | Disposition: A | Discharge status: Rehabilitation Facility | Payer: Medicare Other | Source: Ambulatory Visit | Attending: Internal Medicine | Admitting: Internal Medicine

## 2015-07-25 ENCOUNTER — Other Ambulatory Visit (HOSPITAL_COMMUNITY): Payer: Self-pay

## 2015-07-25 DIAGNOSIS — R52 Pain, unspecified: Secondary | ICD-10-CM

## 2015-07-25 DIAGNOSIS — J9620 Acute and chronic respiratory failure, unspecified whether with hypoxia or hypercapnia: Secondary | ICD-10-CM | POA: Insufficient documentation

## 2015-07-25 DIAGNOSIS — IMO0002 Reserved for concepts with insufficient information to code with codable children: Secondary | ICD-10-CM

## 2015-07-25 DIAGNOSIS — L0291 Cutaneous abscess, unspecified: Secondary | ICD-10-CM

## 2015-07-25 DIAGNOSIS — J9690 Respiratory failure, unspecified, unspecified whether with hypoxia or hypercapnia: Secondary | ICD-10-CM

## 2015-07-25 DIAGNOSIS — Z4659 Encounter for fitting and adjustment of other gastrointestinal appliance and device: Secondary | ICD-10-CM

## 2015-07-25 DIAGNOSIS — I509 Heart failure, unspecified: Secondary | ICD-10-CM

## 2015-07-25 DIAGNOSIS — R229 Localized swelling, mass and lump, unspecified: Secondary | ICD-10-CM

## 2015-07-25 DIAGNOSIS — Z95828 Presence of other vascular implants and grafts: Secondary | ICD-10-CM

## 2015-07-25 DIAGNOSIS — Z452 Encounter for adjustment and management of vascular access device: Secondary | ICD-10-CM

## 2015-07-25 LAB — GLUCOSE, CAPILLARY
GLUCOSE-CAPILLARY: 149 mg/dL — AB (ref 65–99)
GLUCOSE-CAPILLARY: 142 mg/dL — AB (ref 65–99)
Glucose-Capillary: 120 mg/dL — ABNORMAL HIGH (ref 65–99)
Glucose-Capillary: 124 mg/dL — ABNORMAL HIGH (ref 65–99)
Glucose-Capillary: 124 mg/dL — ABNORMAL HIGH (ref 65–99)
Glucose-Capillary: 142 mg/dL — ABNORMAL HIGH (ref 65–99)

## 2015-07-25 LAB — CBC WITH DIFFERENTIAL/PLATELET
Basophils Absolute: 0 10*3/uL (ref 0.0–0.1)
Basophils Relative: 0 % (ref 0–1)
EOS PCT: 1 % (ref 0–5)
Eosinophils Absolute: 0.1 10*3/uL (ref 0.0–0.7)
HEMATOCRIT: 27.3 % — AB (ref 39.0–52.0)
HEMOGLOBIN: 8.7 g/dL — AB (ref 13.0–17.0)
LYMPHS ABS: 1.3 10*3/uL (ref 0.7–4.0)
Lymphocytes Relative: 12 % (ref 12–46)
MCH: 27.2 pg (ref 26.0–34.0)
MCHC: 31.9 g/dL (ref 30.0–36.0)
MCV: 85.3 fL (ref 78.0–100.0)
MONO ABS: 0.7 10*3/uL (ref 0.1–1.0)
Monocytes Relative: 7 % (ref 3–12)
NEUTROS ABS: 8.4 10*3/uL — AB (ref 1.7–7.7)
Neutrophils Relative %: 80 % — ABNORMAL HIGH (ref 43–77)
Platelets: 68 10*3/uL — ABNORMAL LOW (ref 150–400)
RBC: 3.2 MIL/uL — AB (ref 4.22–5.81)
RDW: 16.8 % — AB (ref 11.5–15.5)
WBC: 10.5 10*3/uL (ref 4.0–10.5)

## 2015-07-25 LAB — COMPREHENSIVE METABOLIC PANEL WITH GFR
ALT: 14 U/L — ABNORMAL LOW (ref 17–63)
AST: 35 U/L (ref 15–41)
Albumin: <1 g/dL — ABNORMAL LOW (ref 3.5–5.0)
Alkaline Phosphatase: 199 U/L — ABNORMAL HIGH (ref 38–126)
Anion gap: 7 (ref 5–15)
BUN: 57 mg/dL — ABNORMAL HIGH (ref 6–20)
CO2: 26 mmol/L (ref 22–32)
Calcium: 7.3 mg/dL — ABNORMAL LOW (ref 8.9–10.3)
Chloride: 115 mmol/L — ABNORMAL HIGH (ref 101–111)
Creatinine, Ser: 0.98 mg/dL (ref 0.61–1.24)
GFR calc Af Amer: >60 mL/min
GFR calc non Af Amer: >60 mL/min
Glucose, Bld: 140 mg/dL — ABNORMAL HIGH (ref 65–99)
Potassium: 3.4 mmol/L — ABNORMAL LOW (ref 3.5–5.1)
Sodium: 148 mmol/L — ABNORMAL HIGH (ref 135–145)
Total Bilirubin: 0.9 mg/dL (ref 0.3–1.2)
Total Protein: 4.5 g/dL — ABNORMAL LOW (ref 6.5–8.1)

## 2015-07-25 LAB — CULTURE, ROUTINE-ABSCESS: CULTURE: NO GROWTH

## 2015-07-25 LAB — PROTIME-INR
INR: 1.48 (ref 0.00–1.49)
PROTHROMBIN TIME: 18 s — AB (ref 11.6–15.2)

## 2015-07-25 LAB — APTT: aPTT: 28 s (ref 24–37)

## 2015-07-25 MED ORDER — VANCOMYCIN HCL IN DEXTROSE 1-5 GM/200ML-% IV SOLN
1000.0000 mg | Freq: Two times a day (BID) | INTRAVENOUS | Status: DC
  Administered 2015-07-25: 1000 mg via INTRAVENOUS
  Filled 2015-07-25 (×2): qty 200

## 2015-07-25 MED ORDER — SODIUM CHLORIDE 0.9 % IV SOLN: 100.0000 mg | INTRAVENOUS | Status: DC

## 2015-07-25 MED ORDER — NOREPINEPHRINE BITARTRATE 1 MG/ML IV SOLN: 2.0000 ug/min | INTRAVENOUS | Status: DC

## 2015-07-25 MED ORDER — PANTOPRAZOLE SODIUM 40 MG PO PACK: 40.0000 mg | PACK | Freq: Every day | ORAL | Status: DC

## 2015-07-25 MED ORDER — PRO-STAT SUGAR FREE PO LIQD: 30.0000 mL | Freq: Every day | ORAL | Status: DC

## 2015-07-25 MED ORDER — POTASSIUM CHLORIDE 20 MEQ/15ML (10%) PO SOLN
20.0000 meq | ORAL | Status: AC
  Administered 2015-07-25 (×2): 20 meq
  Filled 2015-07-25 (×2): qty 15

## 2015-07-25 MED ORDER — BISACODYL 10 MG RE SUPP
10.0000 mg | Freq: Once | RECTAL | Status: AC
  Administered 2015-07-25: 10 mg via RECTAL
  Filled 2015-07-25: qty 1

## 2015-07-25 MED ORDER — VANCOMYCIN HCL IN DEXTROSE 1-5 GM/200ML-% IV SOLN: 1000.0000 mg | Freq: Two times a day (BID) | INTRAVENOUS | Status: DC

## 2015-07-25 MED ORDER — VITAL HIGH PROTEIN PO LIQD
ORAL | Status: DC
Start: 2015-07-25 — End: 2015-08-27

## 2015-07-25 MED ORDER — ACETAMINOPHEN 325 MG PO TABS: 650.0000 mg | ORAL_TABLET | Freq: Four times a day (QID) | ORAL | Status: DC | PRN

## 2015-07-25 MED ORDER — INSULIN ASPART 100 UNIT/ML ~~LOC~~ SOLN: SUBCUTANEOUS | Status: DC

## 2015-07-25 MED ORDER — ACETAMINOPHEN 160 MG/5ML PO SOLN
650.0000 mg | Freq: Four times a day (QID) | ORAL | Status: DC | PRN
  Administered 2015-07-25: 650 mg via ORAL
  Filled 2015-07-25: qty 20.3

## 2015-07-25 MED ORDER — CHLORHEXIDINE GLUCONATE 0.12% ORAL RINSE (MEDLINE KIT): 15.0000 mL | Freq: Two times a day (BID) | OROMUCOSAL | Status: DC

## 2015-07-25 MED ORDER — ACETAMINOPHEN 160 MG/5ML PO SOLN: 650.0000 mg | Freq: Four times a day (QID) | ORAL | Status: DC | PRN

## 2015-07-25 MED ORDER — DEXTROSE 5 % IV SOLN: 10.0000 mL | INTRAVENOUS | Status: DC

## 2015-07-25 NOTE — Progress Notes (Signed)
[facility] ADULT ICU REPLACEMENT PROTOCOL FOR AM LAB REPLACEMENT ONLY  The patient does apply for the [facility] Adult ICU Electrolyte Replacment Protocol based on the criteria listed below:   1. Is GFR >/= 40 ml/min? Yes.    Patient's GFR today is >60 2. Is urine output >/= 0.5 ml/kg/hr for the last 6 hours? Yes.   Patient's UOP is 1.7 ml/kg/hr 3. Is BUN < 60 mg/dL? Yes.    Patient's BUN today is 57 4. Abnormal electrolyte(s): Potassium, 3.4 5. Ordered repletion with: [facility] adult ICU replacement protocol 6. If a panic level lab has been reported, has the CCM MD in charge been notified? Yes.  .   Physician:  Lawanda Cousins  Cloyde Reams E 07/25/2015 6:30 AM

## 2015-07-25 NOTE — Progress Notes (Signed)
ANTIBIOTIC CONSULT NOTE - FOLLOW UP  Pharmacy Consult:  Vancomycin Indication:   Enterococcal abscess  Allergies  Allergen Reactions  . Levaquin [Levofloxacin] Nausea Only  . Penicillins Hives  . Doxycycline Rash    Patient Measurements: Weight: 294 lb 8.6 oz (133.6 kg)  Vital Signs: Temp: 101.7 F (38.7 C) (09/10 0811) Temp Source: Oral (09/10 0811) BP: 104/52 mmHg (09/10 0910) Pulse Rate: 99 (09/10 0910) Intake/Output from previous day: 09/09 0701 - 09/10 0700 In: 3065.3 [I.V.:879.3; NG/GT:1200; IV Piggyback:980] Out: 4840 [Urine:4100; Drains:65; Stool:675] Intake/Output from this shift: Total I/O In: 82.8 [I.V.:32.8; NG/GT:50] Out: 325 [Urine:325]  Labs:  Recent Labs  07/22/15 1115 07/23/15 0600 07/24/15 0345 07/25/15 0419  WBC 19.3*  --  11.6* 10.5  HGB 9.8*  --  9.1* 8.7*  PLT 124*  --  73* 68*  CREATININE 1.88* 1.86* 1.37* 0.98   Estimated Creatinine Clearance: 96.5 mL/min (by C-G formula based on Cr of 0.98). No results for input(s): VANCOTROUGH, VANCOPEAK, VANCORANDOM, GENTTROUGH, GENTPEAK, GENTRANDOM, TOBRATROUGH, TOBRAPEAK, TOBRARND, AMIKACINPEAK, AMIKACINTROU, AMIKACIN in the last 72 hours.   Microbiology: Recent Results (from the past 720 hour(s))  Culture, respiratory (NON-Expectorated)     Status: None   Collection Time: 06/30/15  5:45 PM  Result Value Ref Range Status   Specimen Description TRACHEAL ASPIRATE  Final   Special Requests Normal  Final   Gram Stain   Final    FEW WBC PRESENT,BOTH PMN AND MONONUCLEAR RARE SQUAMOUS EPITHELIAL CELLS PRESENT RARE GRAM POSITIVE COCCI IN PAIRS Performed at [facility]    Culture   Final    Non-Pathogenic Oropharyngeal-type Flora Isolated. Performed at [facility]    Report Status 07/02/2015 FINAL  Final  Culture, blood (single)     Status: None   Collection Time: 07/02/15  9:53 AM  Result Value Ref Range Status   Specimen Description BLOOD LEFT HAND  Final   Special Requests  BOTTLES DRAWN AEROBIC ONLY 5CC  Final   Culture NO GROWTH 5 DAYS  Final   Report Status 07/07/2015 FINAL  Final  Culture, respiratory (NON-Expectorated)     Status: None   Collection Time: 07/05/15  4:25 PM  Result Value Ref Range Status   Specimen Description TRACHEAL ASPIRATE  Final   Special Requests NONE  Final   Gram Stain   Final    FEW WBC PRESENT, PREDOMINANTLY PMN RARE SQUAMOUS EPITHELIAL CELLS PRESENT FEW GRAM POSITIVE COCCI IN PAIRS RARE GRAM NEGATIVE RODS Performed at [facility]    Culture   Final    MODERATE GROUP B STREP(S.AGALACTIAE)ISOLATED Note: TESTING AGAINST S. AGALACTIAE NOT ROUTINELY PERFORMED DUE TO PREDICTABILITY OF AMP/PEN/VAN SUSCEPTIBILITY. Performed at [facility]    Report Status 07/08/2015 FINAL  Final  Culture, blood (routine x 2)     Status: None   Collection Time: 07/05/15  6:52 PM  Result Value Ref Range Status   Specimen Description BLOOD LEFT ANTECUBITAL  Final   Special Requests   Final    BOTTLES DRAWN AEROBIC AND ANAEROBIC 10CC BLUE 7CC RED   Culture NO GROWTH 5 DAYS  Final   Report Status 07/10/2015 FINAL  Final  Culture, blood (routine x 2)     Status: None   Collection Time: 07/05/15  7:00 PM  Result Value Ref Range Status   Specimen Description BLOOD LEFT WRIST  Final   Special Requests BOTTLES DRAWN AEROBIC AND ANAEROBIC 10CC  Final   Culture NO GROWTH 5 DAYS  Final  Report Status 07/10/2015 FINAL  Final  MRSA PCR Screening     Status: None   Collection Time: 07/07/15  4:05 PM  Result Value Ref Range Status   MRSA by PCR NEGATIVE NEGATIVE Final    Comment:        The GeneXpert MRSA Assay (FDA approved for NASAL specimens only), is one component of a comprehensive MRSA colonization surveillance program. It is not intended to diagnose MRSA infection nor to guide or monitor treatment for MRSA infections.   Culture, blood (routine x 2)     Status: None   Collection Time: 07/19/15 10:18 PM  Result Value  Ref Range Status   Specimen Description BLOOD PICC LINE  Final   Special Requests BOTTLES DRAWN AEROBIC AND ANAEROBIC 10CC  Final   Culture NO GROWTH 5 DAYS  Final   Report Status 07/24/2015 FINAL  Final  Culture, blood (routine x 2)     Status: None   Collection Time: 07/19/15 10:19 PM  Result Value Ref Range Status   Specimen Description BLOOD LEFT HAND  Final   Special Requests BOTTLES DRAWN AEROBIC AND ANAEROBIC 5CC  Final   Culture NO GROWTH 5 DAYS  Final   Report Status 07/24/2015 FINAL  Final  Culture, Urine     Status: None   Collection Time: 07/19/15 11:10 PM  Result Value Ref Range Status   Specimen Description URINE, RANDOM  Final   Special Requests NONE  Final   Culture NO GROWTH 1 DAY  Final   Report Status 07/21/2015 FINAL  Final  MRSA PCR Screening     Status: None   Collection Time: 07/21/15  3:01 AM  Result Value Ref Range Status   MRSA by PCR NEGATIVE NEGATIVE Final    Comment:        The GeneXpert MRSA Assay (FDA approved for NASAL specimens only), is one component of a comprehensive MRSA colonization surveillance program. It is not intended to diagnose MRSA infection nor to guide or monitor treatment for MRSA infections.   Culture, respiratory (NON-Expectorated)     Status: None   Collection Time: 07/21/15  6:44 AM  Result Value Ref Range Status   Specimen Description TRACHEAL ASPIRATE  Final   Special Requests NONE  Final   Gram Stain   Final    FEW WBC PRESENT,BOTH PMN AND MONONUCLEAR RARE SQUAMOUS EPITHELIAL CELLS PRESENT RARE GRAM POSITIVE RODS RARE GRAM POSITIVE COCCI IN PAIRS YEAST    Culture   Final    Non-Pathogenic Oropharyngeal-type Flora Isolated. Performed at [facility]    Report Status 07/24/2015 FINAL  Final  Culture, routine-abscess     Status: None   Collection Time: 07/21/15  5:45 PM  Result Value Ref Range Status   Specimen Description ABSCESS RIGHT ABDOMEN  Final   Special Requests NONE  Final   Gram Stain    Final    FEW WBC PRESENT,BOTH PMN AND MONONUCLEAR NO SQUAMOUS EPITHELIAL CELLS SEEN NO ORGANISMS SEEN Performed at [facility]    Culture   Final    NO GROWTH 3 DAYS Performed at [facility]    Report Status 07/25/2015 FINAL  Final  Culture, routine-abscess     Status: None   Collection Time: 07/21/15  5:56 PM  Result Value Ref Range Status   Specimen Description ABSCESS LEFT ABDOMEN  Final   Special Requests NONE  Final   Gram Stain   Final    FEW WBC PRESENT,BOTH PMN AND MONONUCLEAR  NO SQUAMOUS EPITHELIAL CELLS SEEN ABUNDANT GRAM POSITIVE COCCI IN PAIRS IN CLUSTERS FEW GRAM NEGATIVE RODS Performed at [facility]    Culture   Final    MODERATE ENTEROCOCCUS SPECIES Performed at [facility]    Report Status 07/24/2015 FINAL  Final   Organism ID, Bacteria ENTEROCOCCUS SPECIES  Final      Susceptibility   Enterococcus species - MIC*    VANCOMYCIN 1 SENSITIVE Sensitive     AMPICILLIN <=2 SENSITIVE Sensitive     * MODERATE ENTEROCOCCUS SPECIES  Culture, respiratory (NON-Expectorated)     Status: None (Preliminary result)   Collection Time: 07/23/15  1:30 PM  Result Value Ref Range Status   Specimen Description BRONCHIAL ALVEOLAR LAVAGE  Final   Special Requests Normal  Final   Gram Stain   Final    NO WBC SEEN NO SQUAMOUS EPITHELIAL CELLS SEEN NO ORGANISMS SEEN Performed at [facility]    Culture NO GROWTH Performed at [facility]   Final   Report Status PENDING  Incomplete      Assessment: 6 YOM with enterococcal abscess to continue on vancomycin and Eraxis.  Patient's renal function is improving.  He is now febrile and PRN Tylenol has been ordered.  WBC is normalized.  Primaxin 9/6 >>9/9 Vanc 9/6 >> 9/8, resumed 9/9 >> Zyvox 9/8 >> 9/9 Anidula 9/6 >> (9/12)  9/6 TA - negative 9/6 left abd abscess - Enterococcus (pan sensitive) 9/6 right abd abscess - negative 9/8 TA -    Goal of Therapy:  Vanc  trough 15-20 mcg/mL   Plan:  - Change vanc to 1gm IV Q12H - Continue anidulafungin  IV Q24H per MD - Monitor renal fxn, clinical progress, vanc trough as indicated - Watch fever curve, CBC   Yosselin Zoeller D. Laney Potash, PharmD, BCPS Pager:  [phone] 07/25/2015, 10:26 AM

## 2015-07-25 NOTE — Progress Notes (Signed)
Subjective: Sedated on vent, cultures with enterococcus  Objective: Vital signs in last 24 hours: Temp:  [97.4 F (36.3 C)-101.7 F (38.7 C)] 101.7 F (38.7 C) (09/10 0811) Pulse Rate:  [34-98] 88 (09/10 0700) Resp:  [20-30] 21 (09/10 0700) BP: (83-115)/(37-59) 109/53 mmHg (09/10 0700) SpO2:  [96 %-100 %] 96 % (09/10 0700) Arterial Line BP: (93-122)/(40-46) 93/40 mmHg (09/09 1300) FiO2 (%):  [30 %-40 %] 30 % (09/10 0600) Weight:  [133.6 kg (294 lb 8.6 oz)] 133.6 kg (294 lb 8.6 oz) (09/10 0415) Last BM Date: 07/25/15  Intake/Output from previous day: 09/09 0701 - 09/10 0700 In: 2981.5 [I.V.:845.5; NG/GT:1150; IV Piggyback:980] Out: 4840 [Urine:4100; Drains:65; Stool:675] Intake/Output this shift:    GI: soft wound vac in place, right sided drain with serous fluid, left sided with brown thin fluid  Lab Results:   Recent Labs  07/24/15 0345 07/25/15 0419  WBC 11.6* 10.5  HGB 9.1* 8.7*  HCT 28.6* 27.3*  PLT 73* 68*   BMET  Recent Labs  07/24/15 0345 07/25/15 0419  NA 147* 148*  K 2.8* 3.4*  CL 116* 115*  CO2 22 26  GLUCOSE 158* 140*  BUN 70* 57*  CREATININE 1.37* 0.98  CALCIUM 7.0* 7.3*   PT/INR  Recent Labs  07/23/15 1200 07/25/15 0419  LABPROT 27.6* 18.0*  INR 2.62* 1.48   ABG  Recent Labs  07/23/15 0918 07/23/15 1042  PHART 7.486* 7.425  HCO3 17.6* 15.9*    Studies/Results: Dg Chest Port 1 View  07/24/2015   CLINICAL DATA:  Sepsis.  EXAM: PORTABLE CHEST - 1 VIEW  COMPARISON:  07/23/2015.  FINDINGS: Interim placement of feeding tube, its tip is below left hemidiaphragm. Tracheostomy tube and left IJ line in stable position. Prior CABG. Stable cardiomegaly. Low lung volumes with mild subsegmental atelectasis and/or infiltrates in the left mid lung and both lung bases. Small left pleural effusion cannot be excluded. No pneumothorax.  IMPRESSION: 1. Interim placement of feeding tube is tip is below the left hemidiaphragm. Remaining lines and  tubes in stable position.  2. Low lung volumes with left mid lung and bibasilar subsegmental atelectasis and/or infiltrates. Small left pleural effusion cannot be excluded.  3.  Prior CABG.  Cardiomegaly.  No pulmonary venous congestion.   Electronically Signed   By: Maisie Fus  Register   On: 07/24/2015 07:10   Dg Chest Port 1 View  07/23/2015   CLINICAL DATA:  Acute respiratory failure.  Replaced trach.  EXAM: PORTABLE CHEST - 1 VIEW  COMPARISON:  07/23/2015  FINDINGS: Tracheostomy tube has been placed, tip approximately 4 cm above the carina. The tip is difficult to visualize likely secondary to overlying sternotomy wires. Left IJ central line tip overlies the level of the superior vena cava.  The heart is enlarged. Status post median sternotomy and CABG. There is increase dense opacity at the medial left lung base which obscures the hemidiaphragm and is consistent with atelectasis or consolidation. Subsegmental atelectasis is also noted bilaterally.  IMPRESSION: 1. Interval placement of tracheostomy tube. 2. Increased opacity at the left lung base consistent was consolidation or increased atelectasis.   Electronically Signed   By: Norva Pavlov M.D.   On: 07/23/2015 14:15   Dg Abd Portable 1v  07/23/2015   CLINICAL DATA:  NG tube placement.  EXAM: PORTABLE ABDOMEN - 1 VIEW  COMPARISON:  CT exam 07/21/2015  FINDINGS: Nasogastric tube has been removed. A feeding tube has been placed, tip overlying the level of the distal stomach.  Visualized bowel gas pattern is nonobstructed. Pigtail type catheters are identified in the right and left lower quadrants.  IMPRESSION: Interval placement of feeding tube tip overlying the level of the distal stomach.   Electronically Signed   By: Norva Pavlov M.D.   On: 07/23/2015 16:15    Anti-infectives: Anti-infectives    Start     Dose/Rate Route Frequency Ordered Stop   07/24/15 1300  vancomycin (VANCOCIN) 1,500 mg in sodium chloride 0.9 % 500 mL IVPB     1,500  mg 250 mL/hr over 120 Minutes Intravenous Every 24 hours 07/24/15 1217     07/24/15 1100  imipenem-cilastatin (PRIMAXIN) 500 mg in sodium chloride 0.9 % 100 mL IVPB  Status:  Discontinued     500 mg 200 mL/hr over 30 Minutes Intravenous 3 times per day 07/24/15 0857 07/24/15 1050   07/23/15 1000  linezolid (ZYVOX) IVPB 600 mg  Status:  Discontinued     600 mg 300 mL/hr over 60 Minutes Intravenous Every 12 hours 07/23/15 0917 07/24/15 1050   07/22/15 1000  anidulafungin (ERAXIS) 100 mg in sodium chloride 0.9 % 100 mL IVPB     100 mg over 90 Minutes Intravenous Every 24 hours 07/21/15 0959     07/21/15 1800  imipenem-cilastatin (PRIMAXIN) 250 mg in sodium chloride 0.9 % 100 mL IVPB  Status:  Discontinued     250 mg 200 mL/hr over 30 Minutes Intravenous 4 times per day 07/21/15 1401 07/24/15 0857   07/21/15 1200  imipenem-cilastatin (PRIMAXIN) 250 mg in sodium chloride 0.9 % 100 mL IVPB  Status:  Discontinued     250 mg 200 mL/hr over 30 Minutes Intravenous Every 12 hours 07/21/15 0200 07/21/15 1401   07/21/15 1100  vancomycin (VANCOCIN) 1,250 mg in sodium chloride 0.9 % 250 mL IVPB  Status:  Discontinued     1,250 mg 166.7 mL/hr over 90 Minutes Intravenous Every 24 hours 07/21/15 1006 07/23/15 0917   07/21/15 1000  anidulafungin (ERAXIS) 200 mg in sodium chloride 0.9 % 200 mL IVPB     200 mg over 180 Minutes Intravenous  Once 07/21/15 0959 07/21/15 1825   07/21/15 0230  vancomycin (VANCOCIN) IVPB 1000 mg/200 mL premix     1,000 mg 200 mL/hr over 60 Minutes Intravenous  Once 07/21/15 0200 07/21/15 0334   07/21/15 0230  imipenem-cilastatin (PRIMAXIN) 500 mg in sodium chloride 0.9 % 100 mL IVPB     500 mg 200 mL/hr over 30 Minutes Intravenous  Once 07/21/15 0200 07/21/15 0303      Assessment/Plan: S/P abdominal colectomy for necrotic colon 8/4 S/P washout and closure 8/6 Severe sepsis due to intra-abdominal abscesses S/P perc drain X 2 - working well. Repeat CT once  drainage down per IR or if worsens Drainage from rectum likely from rectal stump breakdown with peritoneal fluid leaking out - will give suppository today to see if can stimulate any drainage via anus as opposed to leaking proximally FEN - continue tube feeds  Dylynn Ketner 07/25/2015

## 2015-07-26 LAB — BASIC METABOLIC PANEL
Anion gap: 7 (ref 5–15)
BUN: 48 mg/dL — AB (ref 6–20)
CALCIUM: 7.9 mg/dL — AB (ref 8.9–10.3)
CO2: 28 mmol/L (ref 22–32)
CREATININE: 0.88 mg/dL (ref 0.61–1.24)
Chloride: 118 mmol/L — ABNORMAL HIGH (ref 101–111)
Glucose, Bld: 172 mg/dL — ABNORMAL HIGH (ref 65–99)
Potassium: 3.6 mmol/L (ref 3.5–5.1)
SODIUM: 153 mmol/L — AB (ref 135–145)

## 2015-07-26 LAB — CULTURE, RESPIRATORY: GRAM STAIN: NONE SEEN

## 2015-07-26 LAB — CULTURE, RESPIRATORY W GRAM STAIN: Special Requests: NORMAL

## 2015-07-26 LAB — CBC
HEMATOCRIT: 33 % — AB (ref 39.0–52.0)
Hemoglobin: 9.9 g/dL — ABNORMAL LOW (ref 13.0–17.0)
MCH: 26 pg (ref 26.0–34.0)
MCHC: 30 g/dL (ref 30.0–36.0)
MCV: 86.6 fL (ref 78.0–100.0)
PLATELETS: 135 10*3/uL — AB (ref 150–400)
RBC: 3.81 MIL/uL — AB (ref 4.22–5.81)
RDW: 16.8 % — ABNORMAL HIGH (ref 11.5–15.5)
WBC: 21.3 10*3/uL — AB (ref 4.0–10.5)

## 2015-07-26 NOTE — Progress Notes (Signed)
Pt transported to [facility]. Without complications. Report given to RT upon arrival.

## 2015-07-27 DIAGNOSIS — R6521 Severe sepsis with septic shock: Secondary | ICD-10-CM

## 2015-07-27 DIAGNOSIS — J9620 Acute and chronic respiratory failure, unspecified whether with hypoxia or hypercapnia: Secondary | ICD-10-CM | POA: Insufficient documentation

## 2015-07-27 DIAGNOSIS — A419 Sepsis, unspecified organism: Secondary | ICD-10-CM

## 2015-07-27 LAB — CBC
HEMATOCRIT: 27.1 % — AB (ref 39.0–52.0)
Hemoglobin: 8.1 g/dL — ABNORMAL LOW (ref 13.0–17.0)
MCH: 26.6 pg (ref 26.0–34.0)
MCHC: 29.9 g/dL — ABNORMAL LOW (ref 30.0–36.0)
MCV: 88.9 fL (ref 78.0–100.0)
Platelets: 126 10*3/uL — ABNORMAL LOW (ref 150–400)
RBC: 3.05 MIL/uL — AB (ref 4.22–5.81)
RDW: 17.3 % — AB (ref 11.5–15.5)
WBC: 14.1 10*3/uL — AB (ref 4.0–10.5)

## 2015-07-27 LAB — BASIC METABOLIC PANEL
ANION GAP: 6 (ref 5–15)
BUN: 36 mg/dL — ABNORMAL HIGH (ref 6–20)
CALCIUM: 7.4 mg/dL — AB (ref 8.9–10.3)
CO2: 29 mmol/L (ref 22–32)
Chloride: 112 mmol/L — ABNORMAL HIGH (ref 101–111)
Creatinine, Ser: 0.74 mg/dL (ref 0.61–1.24)
GLUCOSE: 135 mg/dL — AB (ref 65–99)
POTASSIUM: 3.1 mmol/L — AB (ref 3.5–5.1)
Sodium: 147 mmol/L — ABNORMAL HIGH (ref 135–145)

## 2015-07-27 LAB — ALBUMIN: Albumin: 1.2 g/dL — ABNORMAL LOW (ref 3.5–5.0)

## 2015-07-27 NOTE — Consult Note (Signed)
Name: Jonathan Le MRN: [id number] DOB: Oct 31, 1946    ADMISSION DATE:  07/25/2015 CONSULTATION DATE:  9/12  REFERRING MD :  Stanton Kidney  CHIEF COMPLAINT:  Vent management   BRIEF PATIENT DESCRIPTION:   69yo male with hx PSA, HTN with recent CABG (05/22/2015) and prolonged hospitalization 8/4-8/24 r/t severe sepsis in setting ischemic bowel.  He underwent exp lap with colectomy 8/4 and closure 8/6.  Course was c/b respiratory failure and failure to wean from vent and trach was placed 8/19 by ENT and ultimately tx to [facility] 8/[facility].  On 9/5 he self decannulated with suspected aspiration event and was tx back to [facility] where he was reintubated and trach revised 9/8.  He was stabilized and tx back to [facility] 9/10 and remains on full vent support.  PCCM re-consulted for vent management on [facility] 9/12.    SIGNIFICANT EVENTS  8/4 Total abdominal colectomy with abdominal vac placement 8/6 Exploratory laparotomy, removal of prior packs, closure of open abdomen, ileostomy 8/7 high pressors 8/8 self extubated 8/9 resp distress , required bipap , lasix 8/13 had to restart levophed for shock overnight. 8/16 reintubated, primarily for MS, agitation, tachypnea 8/19 Trach placed 8/22 tolerating TC. 8/23 to [facility] 9/05 PCCM called back for aspiration PNA and respiratory failure. Pt reintubated. Later transferred to [facility] ICU for worsening septic shock. 9/06 Emergent drainage abscess IR, refractory shock 9/07 Improved pressors 9/08 Trach placed 9/10 Cleared for transfer back to [facility] for rehab and weaning efforts.   STUDIES:  Echo 8/4 >>> EF 25% to 30%. Severe hypokinesis of the anteroseptal and anterior myocardium.Dyskinesis of the apical myocardium. CT chest / abd pelv 8/4 >>>mod to severe cardiomegaly with small pericardial effusion. Pulmonary edema with small pleural effusions. Focal colonic hepatic flexure pneumatosis without perforation. Bilateral nonobstructing nephrolithiasis. CT A / P 8/12 >>> no  evidence abscess. Ascites present, GB sludge, small b/l pleural effusions. Renal US 9/5 >>> No hydronephrosis, multiple complex fluid collections within the peritoneal space. Fluid spaces larger with internal complexity suggesting peritonitis or abscess. CT A / P 9/5 >>>large abscess abdo   HISTORY OF PRESENT ILLNESS:   69yo male with hx PSA, HTN with recent CABG (05/22/2015) and prolonged hospitalization 8/4-8/24 r/t severe sepsis in setting ischemic bowel.  He underwent exp lap with colectomy 8/4 and closure 8/6.  Course was c/b respiratory failure and failure to wean from vent and trach was placed 8/19 by ENT and ultimately tx to [facility] 8/24.  On 9/5 he self decannulated with suspected aspiration event and was tx back to [facility] where he was reintubated and trach revised 9/8.  He was stabilized and tx back to [facility] 9/10 and remains on full vent support.  PCCM re-consulted for vent management on [facility] 9/12.     PAST MEDICAL HISTORY :   has a past medical history of Anxiety; Hypertension; Arthritis; Sleep apnea; Glaucoma; CHF (congestive heart failure); and S/P CABG (coronary artery bypass graft).  has past surgical history that includes Hernia repair; Eye surgery; Wisdom tooth extraction; Fasciotomy foot / toe (Left); Cardiac catheterization (N/A, 05/20/2015); Coronary artery bypass graft (N/A, 05/22/2015); TEE without cardioversion (N/A, 05/22/2015); laparotomy (N/A, 06/18/2015); laparotomy (N/A, 06/20/2015); and Tracheostomy tube placement (N/A, 07/03/2015). Prior to Admission medications   Medication Sig Start Date End Date Taking? Authorizing Provider  acetaminophen (TYLENOL) 160 MG/5ML solution Take 20.3 mLs (650 mg total) by mouth every 6 (six) hours as needed for fever. 07/25/15   Jeanella Craze, NP  Amino Acids-Protein Hydrolys (FEEDING  SUPPLEMENT, PRO-STAT SUGAR FREE 64,) LIQD Place 30 mLs into feeding tube 5 (five) times daily. 07/25/15   Jeanella Craze, NP  anidulafungin 100 mg in sodium chloride 0.9  % 100 mL Inject 100 mg into the vein daily. 07/25/15   Jeanella Craze, NP  chlorhexidine gluconate (PERIDEX) 0.12 % solution 15 mLs by Mouth Rinse route 2 (two) times daily. 07/25/15   Jeanella Craze, NP  dextrose 5 % solution Inject 10 mLs into the vein continuous. 07/25/15   Jeanella Craze, NP  insulin aspart (NOVOLOG) 100 UNIT/ML injection CBG < 70: implement hypoglycemia protocol CBG 70 - 120: 0 units CBG 121 - 150: 2 units CBG 151 - 200: 3 units CBG 201 - 250: 5 units CBG 251 - 300: 8 units CBG 301 - 350: 11 units CBG 351 - 400: 15 units CBG > 400: call MD and obtain STAT lab verification 07/25/15   Jeanella Craze, NP  norepinephrine 16 mg in dextrose 5 % 250 mL Inject 2-50 mcg/min into the vein continuous. 07/25/15   Jeanella Craze, NP  Nutritional Supplements (FEEDING SUPPLEMENT, VITAL HIGH PROTEIN,) LIQD liquid 50 ml/hr 07/25/15   Jeanella Craze, NP  pantoprazole sodium (PROTONIX) 40 mg/20 mL PACK Place 20 mLs (40 mg total) into feeding tube daily. 07/25/15   Jeanella Craze, NP  vancomycin (VANCOCIN) 1 GM/200ML SOLN Inject 200 mLs (1,000 mg total) into the vein every 12 (twelve) hours. 07/25/15   Jeanella Craze, NP  white petrolatum (VASELINE) GEL Apply 1 application topically daily. Apply to L nare wound daily 07/08/15   Jeanella Craze, NP   Allergies  Allergen Reactions  . Levaquin [Levofloxacin] Nausea Only  . Penicillins Hives  . Doxycycline Rash    FAMILY HISTORY:  family history is not on file. SOCIAL HISTORY:  reports that he has never smoked. He has never used smokeless tobacco. He reports that he does not drink alcohol or use illicit drugs.  REVIEW OF SYSTEMS:   Unable.    SUBJECTIVE:   VITAL SIGNS:  Reviewed at bedside.  Abnormal values addressed in Imp/Plan.   PHYSICAL EXAMINATION: General:  Chronically ill appearing male, NAD  Neuro:  Minimal response, does open eyes to voice, does not track, does not follow commands  HEENT:  Mm dry, no JVD Cardiovascular:   s1s2rrr Lungs:  resps even non labored on PS 12/5 Abdomen:  Round, soft, RLQ ostomy with drainage, midline incision dressing c/d, VAC has been removed  Musculoskeletal:  Warm and dry, 2+ BLE edema, scrotal edema    Recent Labs Lab 07/25/15 0419 07/26/15 0606 07/27/15 0500  NA 148* 153* 147*  K 3.4* 3.6 3.1*  CL 115* 118* 112*  CO2 26 28 29   BUN 57* 48* 36*  CREATININE 0.98 0.88 0.74  GLUCOSE 140* 172* 135*    Recent Labs Lab 07/25/15 0419 07/26/15 0606 07/27/15 0500  HGB 8.7* 9.9* 8.1*  HCT 27.3* 33.0* 27.1*  WBC 10.5 21.3* 14.1*  PLT 68* 135* 126*   Dg Abd Portable 1v  07/25/2015   CLINICAL DATA:  NG tube placement.  EXAM: PORTABLE ABDOMEN - 1 VIEW  COMPARISON:  07/23/2015  FINDINGS: Feeding tube with tip in the right upper quadrant consistent with location in the distal stomach or possible proximal duodenum. Paucity of gas in the visualized abdomen. Degenerative changes in the spine.  IMPRESSION: Feeding tube tip is in the right upper quadrant consistent with location in the distal stomach.  Electronically Signed   By: Burman Nieves M.D.   On: 07/25/2015 23:17    ASSESSMENT / PLAN:  Trach/ventilator dependence -- trach re-do 9/8.  PLAN -  Cont wean per [facility] protocol, to PSV today 9/12  Septic shock - abdominal abscess- enterococcal (amp sensitive) PLAN -  Continue Vanc (started 9/6) Aningulofungin 9/7 plan for 7 days total Continue current abscess drain management per interventional radiology Consider Repeat CT abd when drainage decreased (per guidance of IR) L IJ CVL initiated 9/6, consider d/c within 14 days    Ischemic cardiomyopathy - hx CABG on 7/16 Resolving septic shock w/ Persistent hypotension (pressor dependence)  PLAN -  Monitor closely off pressors, weaned off 9/12 am Check CVP    AKI - suspect pre-renal from low flow state / hypoperfusion- resolving Mild hypernatremia. 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 Severe Protein Calorie Malnutrition  Breakdown / necrosis rectum Acute Metabolic encephalopathy. Pain, Anxiety. Generalized deconditioning. PLAN -  Per [facility]  Ongoing rehab efforts  Surgery following    Dirk Dress, NP 07/27/2015  10:54 AM Pager: [phone] or [phone]  Attending Note:  I have examined patient, reviewed labs, studies and notes. I have discussed the case with Jasper Riling, and I agree with the data and plans as amended above. Patient has chronic resp failure requiring trach in aftermath of ischemic bowel resection following CABG. Course c/b peritonitis and abscesses requiring drainage, removal of trach resulting in aspiration. Janina Mayo has been replaced, pressors weaning to off. Abx / anti-fungals restarted as above. Plan to start PSV weaning protocol, finish abx regimen, abd drain removal when output decreases. ? reimaging abd soon. Independent critical care time is 45 minutes.   Levy Pupa, MD, PhD 07/27/2015, 11:02 AM [facility] [phone] or if no answer [phone]

## 2015-07-28 ENCOUNTER — Other Ambulatory Visit (HOSPITAL_COMMUNITY): Payer: Self-pay

## 2015-07-28 ENCOUNTER — Encounter (HOSPITAL_COMMUNITY): Payer: Self-pay

## 2015-07-28 LAB — RENAL FUNCTION PANEL
ALBUMIN: 1.4 g/dL — AB (ref 3.5–5.0)
Anion gap: 5 (ref 5–15)
BUN: 30 mg/dL — AB (ref 6–20)
CALCIUM: 7.5 mg/dL — AB (ref 8.9–10.3)
CHLORIDE: 113 mmol/L — AB (ref 101–111)
CO2: 28 mmol/L (ref 22–32)
CREATININE: 0.66 mg/dL (ref 0.61–1.24)
GFR calc Af Amer: >60 mL/min (ref >60)
Glucose, Bld: 109 mg/dL — ABNORMAL HIGH (ref 65–99)
PHOSPHORUS: 2.4 mg/dL — AB (ref 2.5–4.6)
Potassium: 3.6 mmol/L (ref 3.5–5.1)
SODIUM: 146 mmol/L — AB (ref 135–145)

## 2015-07-28 LAB — CBC
HEMATOCRIT: 26.3 % — AB (ref 39.0–52.0)
HEMOGLOBIN: 7.9 g/dL — AB (ref 13.0–17.0)
MCH: 27 pg (ref 26.0–34.0)
MCHC: 30 g/dL (ref 30.0–36.0)
MCV: 89.8 fL (ref 78.0–100.0)
Platelets: 146 10*3/uL — ABNORMAL LOW (ref 150–400)
RBC: 2.93 MIL/uL — ABNORMAL LOW (ref 4.22–5.81)
RDW: 18.3 % — ABNORMAL HIGH (ref 11.5–15.5)
WBC: 13.2 10*3/uL — AB (ref 4.0–10.5)

## 2015-07-28 LAB — VANCOMYCIN, TROUGH
VANCOMYCIN TR: 37 ug/mL — AB (ref 10.0–20.0)
VANCOMYCIN TR: 26 ug/mL — AB (ref 10.0–20.0)

## 2015-07-28 LAB — BRAIN NATRIURETIC PEPTIDE: B NATRIURETIC PEPTIDE 5: 453.8 pg/mL — AB (ref 0.0–100.0)

## 2015-07-28 LAB — MAGNESIUM: Magnesium: 1.4 mg/dL — ABNORMAL LOW (ref 1.7–2.4)

## 2015-07-29 ENCOUNTER — Other Ambulatory Visit (HOSPITAL_COMMUNITY): Payer: Self-pay

## 2015-07-29 LAB — TSH: TSH: 5.437 u[IU]/mL — ABNORMAL HIGH (ref 0.350–4.500)

## 2015-07-29 LAB — URINE MICROSCOPIC-ADD ON

## 2015-07-29 LAB — URINALYSIS, ROUTINE W REFLEX MICROSCOPIC
Bilirubin Urine: NEGATIVE
GLUCOSE, UA: NEGATIVE mg/dL
Ketones, ur: NEGATIVE mg/dL
Nitrite: NEGATIVE
Specific Gravity, Urine: 1.019 (ref 1.005–1.030)
Urobilinogen, UA: 0.2 mg/dL (ref 0.0–1.0)
pH: 6.5 (ref 5.0–8.0)

## 2015-07-29 LAB — CBC
HEMATOCRIT: 25.6 % — AB (ref 39.0–52.0)
HEMOGLOBIN: 8 g/dL — AB (ref 13.0–17.0)
MCH: 28.5 pg (ref 26.0–34.0)
MCHC: 31.3 g/dL (ref 30.0–36.0)
MCV: 91.1 fL (ref 78.0–100.0)
Platelets: 165 10*3/uL (ref 150–400)
RBC: 2.81 MIL/uL — ABNORMAL LOW (ref 4.22–5.81)
RDW: 19 % — ABNORMAL HIGH (ref 11.5–15.5)
WBC: 15 10*3/uL — ABNORMAL HIGH (ref 4.0–10.5)

## 2015-07-29 LAB — AMMONIA: AMMONIA: 18 umol/L (ref 9–35)

## 2015-07-30 LAB — APTT: APTT: 31 s (ref 24–37)

## 2015-07-30 LAB — PROTIME-INR
INR: 1.35 (ref 0.00–1.49)
PROTHROMBIN TIME: 16.8 s — AB (ref 11.6–15.2)

## 2015-07-30 LAB — VANCOMYCIN, TROUGH: VANCOMYCIN TR: 22 ug/mL — AB (ref 10.0–20.0)

## 2015-07-31 LAB — BASIC METABOLIC PANEL
Anion gap: 8 (ref 5–15)
BUN: 25 mg/dL — AB (ref 6–20)
CHLORIDE: 107 mmol/L (ref 101–111)
CO2: 24 mmol/L (ref 22–32)
Calcium: 7.6 mg/dL — ABNORMAL LOW (ref 8.9–10.3)
Creatinine, Ser: 0.74 mg/dL (ref 0.61–1.24)
GFR calc non Af Amer: >60 mL/min (ref >60)
Glucose, Bld: 134 mg/dL — ABNORMAL HIGH (ref 65–99)
POTASSIUM: 4.1 mmol/L (ref 3.5–5.1)
SODIUM: 139 mmol/L (ref 135–145)

## 2015-07-31 LAB — URINE CULTURE: Culture: NO GROWTH

## 2015-07-31 LAB — CBC
HEMATOCRIT: 27.5 % — AB (ref 39.0–52.0)
HEMOGLOBIN: 8.2 g/dL — AB (ref 13.0–17.0)
MCH: 26.8 pg (ref 26.0–34.0)
MCHC: 29.8 g/dL — AB (ref 30.0–36.0)
MCV: 89.9 fL (ref 78.0–100.0)
Platelets: 255 10*3/uL (ref 150–400)
RBC: 3.06 MIL/uL — AB (ref 4.22–5.81)
RDW: 19.7 % — ABNORMAL HIGH (ref 11.5–15.5)
WBC: 13.8 10*3/uL — ABNORMAL HIGH (ref 4.0–10.5)

## 2015-07-31 LAB — VANCOMYCIN, TROUGH: VANCOMYCIN TR: 13 ug/mL (ref 10.0–20.0)

## 2015-08-02 LAB — RENAL FUNCTION PANEL
Albumin: 1.2 g/dL — ABNORMAL LOW (ref 3.5–5.0)
Anion gap: 5 (ref 5–15)
BUN: 17 mg/dL (ref 6–20)
CHLORIDE: 109 mmol/L (ref 101–111)
CO2: 24 mmol/L (ref 22–32)
CREATININE: 0.51 mg/dL — AB (ref 0.61–1.24)
Calcium: 7.8 mg/dL — ABNORMAL LOW (ref 8.9–10.3)
GFR calc Af Amer: >60 mL/min (ref >60)
Glucose, Bld: 131 mg/dL — ABNORMAL HIGH (ref 65–99)
POTASSIUM: 3.7 mmol/L (ref 3.5–5.1)
Phosphorus: 2.6 mg/dL (ref 2.5–4.6)
Sodium: 138 mmol/L (ref 135–145)

## 2015-08-02 LAB — CBC
HEMATOCRIT: 22.8 % — AB (ref 39.0–52.0)
HEMOGLOBIN: 7 g/dL — AB (ref 13.0–17.0)
MCH: 27.6 pg (ref 26.0–34.0)
MCHC: 30.7 g/dL (ref 30.0–36.0)
MCV: 89.8 fL (ref 78.0–100.0)
Platelets: 267 10*3/uL (ref 150–400)
RBC: 2.54 MIL/uL — ABNORMAL LOW (ref 4.22–5.81)
RDW: 19.7 % — ABNORMAL HIGH (ref 11.5–15.5)
WBC: 12.2 10*3/uL — ABNORMAL HIGH (ref 4.0–10.5)

## 2015-08-02 LAB — CULTURE, RESPIRATORY

## 2015-08-02 LAB — BRAIN NATRIURETIC PEPTIDE: B NATRIURETIC PEPTIDE 5: 367.7 pg/mL — AB (ref 0.0–100.0)

## 2015-08-03 ENCOUNTER — Other Ambulatory Visit (HOSPITAL_COMMUNITY): Payer: Self-pay

## 2015-08-03 LAB — CBC
HEMATOCRIT: 25.6 % — AB (ref 39.0–52.0)
HEMOGLOBIN: 7.8 g/dL — AB (ref 13.0–17.0)
MCH: 27.5 pg (ref 26.0–34.0)
MCHC: 30.5 g/dL (ref 30.0–36.0)
MCV: 90.1 fL (ref 78.0–100.0)
PLATELETS: 246 10*3/uL (ref 150–400)
RBC: 2.84 MIL/uL — AB (ref 4.22–5.81)
RDW: 19.9 % — ABNORMAL HIGH (ref 11.5–15.5)
WBC: 10.2 10*3/uL (ref 4.0–10.5)

## 2015-08-03 LAB — BASIC METABOLIC PANEL
ANION GAP: 5 (ref 5–15)
BUN: 15 mg/dL (ref 6–20)
CHLORIDE: 108 mmol/L (ref 101–111)
CO2: 25 mmol/L (ref 22–32)
Calcium: 7.9 mg/dL — ABNORMAL LOW (ref 8.9–10.3)
Creatinine, Ser: 0.45 mg/dL — ABNORMAL LOW (ref 0.61–1.24)
GFR calc Af Amer: >60 mL/min (ref >60)
GLUCOSE: 128 mg/dL — AB (ref 65–99)
POTASSIUM: 3.4 mmol/L — AB (ref 3.5–5.1)
Sodium: 138 mmol/L (ref 135–145)

## 2015-08-03 LAB — MAGNESIUM: Magnesium: 1.7 mg/dL (ref 1.7–2.4)

## 2015-08-03 LAB — CULTURE, BLOOD (ROUTINE X 2)
Culture: NO GROWTH
Culture: NO GROWTH

## 2015-08-05 ENCOUNTER — Ambulatory Visit: Payer: Medicare Other | Admitting: Cardiothoracic Surgery

## 2015-08-05 ENCOUNTER — Ambulatory Visit (HOSPITAL_COMMUNITY): Payer: Medicare Other | Attending: Internal Medicine

## 2015-08-05 ENCOUNTER — Ambulatory Visit (HOSPITAL_COMMUNITY): Payer: Medicare Other

## 2015-08-05 DIAGNOSIS — M7989 Other specified soft tissue disorders: Secondary | ICD-10-CM | POA: Insufficient documentation

## 2015-08-05 DIAGNOSIS — I82432 Acute embolism and thrombosis of left popliteal vein: Secondary | ICD-10-CM | POA: Insufficient documentation

## 2015-08-05 DIAGNOSIS — R52 Pain, unspecified: Secondary | ICD-10-CM

## 2015-08-05 LAB — C DIFFICILE QUICK SCREEN W PCR REFLEX
C Diff antigen: NEGATIVE
C Diff interpretation: NEGATIVE
C Diff toxin: NEGATIVE

## 2015-08-05 LAB — GRAM STAIN

## 2015-08-05 NOTE — Progress Notes (Addendum)
*  Preliminary Results* Bilateral lower extremity venous duplex completed. Study was extremely technically limited due to edema and poor acoustic penetration. Unable to visualize most bilateral lower extremity veins with the exception of the left popliteal vein, which is positive for acute deep vein thrombosis.  Preliminary results discussed with Rosey Bath, RN.  08/05/2015 1:11 PM  Gertie Fey, RVT, RDCS, RDMS

## 2015-08-06 LAB — CBC
HCT: 23 % — ABNORMAL LOW (ref 39.0–52.0)
Hemoglobin: 7.1 g/dL — ABNORMAL LOW (ref 13.0–17.0)
MCH: 27.4 pg (ref 26.0–34.0)
MCHC: 30.9 g/dL (ref 30.0–36.0)
MCV: 88.8 fL (ref 78.0–100.0)
PLATELETS: 399 10*3/uL (ref 150–400)
RBC: 2.59 MIL/uL — ABNORMAL LOW (ref 4.22–5.81)
RDW: 18.8 % — AB (ref 11.5–15.5)
WBC: 7.2 10*3/uL (ref 4.0–10.5)

## 2015-08-06 NOTE — Progress Notes (Signed)
Name: Jonathan Le MRN: [id number] DOB: 1946-05-18    ADMISSION DATE:  07/25/2015 CONSULTATION DATE:  9/12  REFERRING MD :  Stanton Kidney  CHIEF COMPLAINT:  Vent management   BRIEF PATIENT DESCRIPTION:    69 yo male was hospitalized in August 2016 for ischemic bowel s/p colectomy.  Developed respiratory failure with failure to wean from vent, and had trach by ENT on 8/19.  He developed aspiration with recurrent VDRF and PCCM consulted.  SIGNIFICANT EVENTS  8/4 Total abdominal colectomy with abdominal vac placement 8/6 Exploratory laparotomy, removal of prior packs, closure of open abdomen, ileostomy 8/7 high pressors 8/8 self extubated 8/9 resp distress , required bipap , lasix 8/13 had to restart levophed for shock overnight. 8/16 reintubated, primarily for MS, agitation, tachypnea 8/19 Trach placed 8/22 tolerating TC. 8/23 to [facility] 9/05 PCCM called back for aspiration PNA and respiratory failure. Pt reintubated. Later transferred to [facility] ICU for worsening septic shock. 9/06 Emergent drainage abscess IR, refractory shock 9/07 Improved pressors 9/08 Trach placed 9/10 Cleared for transfer back to [facility] for rehab and weaning efforts.  9/22: starting ATC trials and PMV trials   STUDIES:  Echo 8/4 >>> EF 25 to 30% CT chest / abd/ pelv 8/4 >>> heptic flexure pneumatosis, b/l renal stones Renal US 9/5 >>> No hydronephrosis, multiple complex fluid collections within the peritoneal space CT A / P 9/5 >>>large abscess abdo  SUBJECTIVE:  No distress   VITAL SIGNS: Afebrile RR 90s sats 95%   PHYSICAL EXAMINATION: General:  Chronically ill appearing male, NAD, no distress  Neuro:  Awake, interactive, moves all ext. Confused. Thinks it's his birthday. Wants to go home HEENT:  Mm dry, no JVD, trach & xlt unremarkable  Cardiovascular:  s1s2rrr Lungs:  resps even non labored w/ scattered rhonchi  Abdomen:  Round, soft, RLQ ostomy with drainage, midline incision dressing c/d, VAC has  been removed  Musculoskeletal:  Warm and dry, 2+ BLE edema, scrotal edema   CMP Latest Ref Rng 08/03/2015 08/02/2015 07/31/2015  Glucose 65 - 99 mg/dL 409(W) 119(J) 478(G)  BUN 6 - 20 mg/dL 15 17 95(A)  Creatinine 0.61 - 1.24 mg/dL 2.13(Y) 8.65(H) 8.46  Sodium 135 - 145 mmol/L 138 138 139  Potassium 3.5 - 5.1 mmol/L 3.4(L) 3.7 4.1  Chloride 101 - 111 mmol/L 108 109 107  CO2 22 - 32 mmol/L Calcium 8.9 - 10.3 mg/dL 7.9(L) 7.8(L) 7.6(L)  Total Protein 6.5 - 8.1 g/dL - - -  Total Bilirubin 0.3 - 1.2 mg/dL - - -  Alkaline Phos 38 - 126 U/L - - -  AST 15 - 41 U/L - - -  ALT 17 - 63 U/L - - -    CBC Latest Ref Rng 08/06/2015 08/03/2015 08/02/2015  WBC 4.0 - 10.5 K/uL 7.2 10.2 12.2(H)  Hemoglobin 13.0 - 17.0 g/dL 7.1(L) 7.8(L) 7.0(L)  Hematocrit 39.0 - 52.0 % 23.0(L) 25.6(L) 22.8(L)  Platelets 150 - 400 K/uL 399 246 267    Dg Chest Port 1 View  08/03/2015   CLINICAL DATA:  Subsequent encounter for respiratory failure  EXAM: PORTABLE CHEST - 1 VIEW  COMPARISON:  Multiple recent previous exams.  FINDINGS: 0604 hours. Lung volumes are low. There is basilar atelectasis, as before. Cardiopericardial silhouette is upper limits of normal for size to mildly enlarged. The left IJ central line remains in place with catheter tip overlying the innominate vein confluence. A feeding tube passes into the stomach although the distal tip  position is not included on the film. Telemetry leads overlie the chest.  IMPRESSION: Slight improvement in basilar aeration.  Otherwise stable.   Electronically Signed   By: Kennith Center M.D.   On: 08/03/2015 07:35    ASSESSMENT / PLAN:  Trach/ventilator dependence -s/p prolonged critical illness in setting of abd abscess s/p S/p total colectomy with ileostomy for ischemic colitis (06/18/15), and septic shock- trach re-do 9/8. Looks very comfortable on ATC Plan: Push ATC trials Hope to transition from 7 XLT to 6 XLT cuffless by 9/26 Have asked SLP to begin PMV  trials   Abdominal abscess- enterococcal (amp sensitive) Plan: Continue Vanc (started 9/6) Aningulofungin 9/7 plan for 7 days total Continue current abscess drain management per interventional radiology Consider Repeat CT abd when drainage decreased (per guidance of IR) L IJ CVL initiated 9/6, consider d/c within 14 days   LLE DVT Plan: Per primary service   Ischemic cardiomyopathy - hx CABG on 7/16 Resolved septic shock w/ Persistent hypotension (pressor dependence) Plan: Per primary team  Hypokalemia Severe Protein Calorie Malnutrition  Breakdown / necrosis rectum Acute Metabolic encephalopathy Pain, Anxiety. Generalized deconditioning. Plan: Per [facility]  Ongoing rehab efforts  Surgery following    Reviewed above.  He is tolerating trach collar, and tried on PMV this AM.  Once he maintains for longer period of time off vent will then downsize trach.  Trach site clean, lungs with scattered rhonchi.  Coralyn Helling, MD [facility] 08/06/2015, 11:38 AM Pager:  [phone] After 3pm call: [phone]   Attending Note:

## 2015-08-07 ENCOUNTER — Other Ambulatory Visit (HOSPITAL_COMMUNITY): Payer: Self-pay

## 2015-08-07 LAB — CBC
HCT: 24 % — ABNORMAL LOW (ref 39.0–52.0)
Hemoglobin: 7.4 g/dL — ABNORMAL LOW (ref 13.0–17.0)
MCH: 27.3 pg (ref 26.0–34.0)
MCHC: 30.8 g/dL (ref 30.0–36.0)
MCV: 88.6 fL (ref 78.0–100.0)
PLATELETS: 418 10*3/uL — AB (ref 150–400)
RBC: 2.71 MIL/uL — ABNORMAL LOW (ref 4.22–5.81)
RDW: 19.1 % — AB (ref 11.5–15.5)
WBC: 8.7 10*3/uL (ref 4.0–10.5)

## 2015-08-07 LAB — BASIC METABOLIC PANEL
ANION GAP: 4 — AB (ref 5–15)
BUN: 11 mg/dL (ref 6–20)
CALCIUM: 7.6 mg/dL — AB (ref 8.9–10.3)
CO2: 25 mmol/L (ref 22–32)
Chloride: 102 mmol/L (ref 101–111)
Creatinine, Ser: 0.45 mg/dL — ABNORMAL LOW (ref 0.61–1.24)
GFR calc Af Amer: >60 mL/min (ref >60)
GLUCOSE: 121 mg/dL — AB (ref 65–99)
Potassium: 3.4 mmol/L — ABNORMAL LOW (ref 3.5–5.1)
SODIUM: 131 mmol/L — AB (ref 135–145)

## 2015-08-07 LAB — URINE CULTURE: Culture: NO GROWTH

## 2015-08-07 LAB — PROTIME-INR
INR: 1.52 — ABNORMAL HIGH (ref 0.00–1.49)
Prothrombin Time: 18.4 seconds — ABNORMAL HIGH (ref 11.6–15.2)

## 2015-08-08 LAB — CULTURE, RESPIRATORY

## 2015-08-08 LAB — PROTIME-INR
INR: 1.37 (ref 0.00–1.49)
PROTHROMBIN TIME: 16.9 s — AB (ref 11.6–15.2)

## 2015-08-08 LAB — CULTURE, RESPIRATORY W GRAM STAIN

## 2015-08-08 LAB — PROCALCITONIN: Procalcitonin: 0.16 ng/mL

## 2015-08-09 LAB — PROTIME-INR
INR: 1.45 (ref 0.00–1.49)
Prothrombin Time: 17.7 seconds — ABNORMAL HIGH (ref 11.6–15.2)

## 2015-08-10 ENCOUNTER — Other Ambulatory Visit (HOSPITAL_COMMUNITY): Payer: Self-pay

## 2015-08-10 DIAGNOSIS — Z930 Tracheostomy status: Secondary | ICD-10-CM

## 2015-08-10 DIAGNOSIS — E876 Hypokalemia: Secondary | ICD-10-CM

## 2015-08-10 LAB — CULTURE, BLOOD (ROUTINE X 2)
CULTURE: NO GROWTH
Culture: NO GROWTH

## 2015-08-10 LAB — PROTIME-INR
INR: 1.57 — ABNORMAL HIGH (ref 0.00–1.49)
Prothrombin Time: 18.8 seconds — ABNORMAL HIGH (ref 11.6–15.2)

## 2015-08-10 NOTE — Progress Notes (Signed)
Name: Jonathan Le MRN: [id number] DOB: 1946/03/06    ADMISSION DATE:  07/25/2015 CONSULTATION DATE:  9/12  REFERRING MD :  Stanton Kidney  CHIEF COMPLAINT:  Vent management   BRIEF PATIENT DESCRIPTION:    69 yo male was hospitalized in August 2016 for ischemic bowel s/p colectomy.  Developed respiratory failure with failure to wean from vent, and had trach by ENT on 8/19.  He developed aspiration with recurrent VDRF and PCCM consulted.  SIGNIFICANT EVENTS  8/4 Total abdominal colectomy with abdominal vac placement 8/6 Exploratory laparotomy, removal of prior packs, closure of open abdomen, ileostomy 8/7 high pressors 8/8 self extubated 8/9 resp distress , required bipap , lasix 8/13 had to restart levophed for shock overnight. 8/16 reintubated, primarily for MS, agitation, tachypnea 8/19 Trach placed 8/22 tolerating TC. 8/23 to [facility] 9/05 PCCM called back for aspiration PNA and respiratory failure. Pt reintubated. Later transferred to [facility] ICU for worsening septic shock. 9/06 Emergent drainage abscess IR, refractory shock 9/07 Improved pressors 9/08 Trach placed 9/10 Cleared for transfer back to [facility] for rehab and weaning efforts.  9/22: starting ATC trials and PMV trials   STUDIES:  Echo 8/4 >>> EF 25 to 30% CT chest / abd/ pelv 8/4 >>> heptic flexure pneumatosis, b/l renal stones Renal US 9/5 >>> No hydronephrosis, multiple complex fluid collections within the peritoneal space CT A / P 9/5 >>>large abscess abdo  SUBJECTIVE:  No distress   VITAL SIGNS: 98.5 hr 92, rr 22 sats 99% BP 119/69  PHYSICAL EXAMINATION: General:  Chronically ill appearing male, NAD, no distress  Neuro:  Awake, interactive, moves all ext. Tries to communicate  HEENT:  Mm dry, no JVD, trach & xlt unremarkable -->some wet frothy secretions  Cardiovascular:  [id number] Lungs:  resps even non labored w/ scattered rhonchi  Abdomen:  Round, soft, RLQ ostomy with drainage, midline incision dressing c/d,  VAC has been removed  Musculoskeletal:  Warm and dry, 2+ BLE edema, scrotal edema   CMP Latest Ref Rng 08/07/2015 08/03/2015 08/02/2015  Glucose 65 - 99 mg/dL 409(W) 119(J) 478(G)  BUN 6 - 20 mg/dL Creatinine 0.61 - 1.24 mg/dL 9.56(O) 1.30(Q) 6.57(Q)  Sodium 135 - 145 mmol/L 131(L) 138 138  Potassium 3.5 - 5.1 mmol/L 3.4(L) 3.4(L) 3.7  Chloride 101 - 111 mmol/L 102 108 109  CO2 22 - 32 mmol/L Calcium 8.9 - 10.3 mg/dL 7.6(L) 7.9(L) 7.8(L)  Total Protein 6.5 - 8.1 g/dL - - -  Total Bilirubin 0.3 - 1.2 mg/dL - - -  Alkaline Phos 38 - 126 U/L - - -  AST 15 - 41 U/L - - -  ALT 17 - 63 U/L - - -    CBC Latest Ref Rng 08/07/2015 08/06/2015 08/03/2015  WBC 4.0 - 10.5 K/uL 8.7 7.2 10.2  Hemoglobin 13.0 - 17.0 g/dL 7.4(L) 7.1(L) 7.8(L)  Hematocrit 39.0 - 52.0 % 24.0(L) 23.0(L) 25.6(L)  Platelets 150 - 400 K/uL 418(H) 399 246    Dg Chest Port 1 View  08/07/2015   CLINICAL DATA:  Status post PICC line placement  EXAM: PORTABLE CHEST - 1 VIEW  COMPARISON:  08/03/2015  FINDINGS: A new right PICC line is noted at the cavoatrial junction. The previously seen left jugular central line is been removed. A feeding catheter and tracheostomy tube are again noted and stable. The overall inspiratory effort is poor with some crowding of the vascular markings. Slight increased density is noted over the  left hemi thorax which could be related to a small effusion but likely related to the poor inspiratory effort. Cardiac shadow is stable.  IMPRESSION: Poor inspiratory effort.  New right-sided PICC line in satisfactory position.   Electronically Signed   By: Alcide Clever M.D.   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 Discussion:  Looks good. Having some difficulty w/ PMV trials. Still requiring vent at  night.  Plan: Push ATC trials Will change to 6 cuffed XLT and hold here until he has made significant rehab progress.  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 Generalized deconditioning. Plan: Per select  Ongoing rehab efforts  Surgery following   Attending Note:  Above note edited.  I reviewed CXR myself, tracheostomy in place, bibasilar atelectasis noted.  Discussed with PCCM-NP and SSH-MD.  69 year old male intubated for inability to protect airway then eventually trached with prolonged illness.  On exam, debilitated, older than stated age male with trach with no leak.  Very deconditioned.  PULMONARY ETT 8/4 >>8/8 ETT 8/16 >> 8/19 Trach 8/19 (Dr Pollyann Kennedy) >> self decannulated exact date unknown. ETT 9/5>>> A:  VDRF - reintubated 9/5 for aspiration and inability to protect airway. OSA. Aspiration PNA. P:  Push ATC trials Will change to 6 cuffed XLT and hold here until he has made significant rehab progress.  Have asked SLP to begin PMV trials  INFECTIOUS A:  Septic shock - aspiration PNA + intraabdominal process (peritonitis vs abdominal abscess). R LE ulcer, chronic. P: Continue Vanc (started 9/6) Aningulofungin 9/7 plan for 7 days total Continue current abscess drain management per interventional radiology Consider Repeat CT abd when drainage decreased (per guidance of IR) L IJ CVL initiated 9/6, consider d/c within  14 days   DVT: - Heparin.  Hypokalemia: - Replace and recheck as ordered.  Patient seen and examined, agree with above note.  I dictated the care and orders written for this patient under my direction.  Alyson Reedy, MD [phone]

## 2015-08-11 LAB — PROTIME-INR
INR: 1.78 — AB (ref 0.00–1.49)
PROTHROMBIN TIME: 20.6 s — AB (ref 11.6–15.2)

## 2015-08-11 NOTE — Consult Note (Signed)
Chief Complaint: Patient was seen in consultation today for new L abdominal abscess at the request of Dr Sharyon Medicus  Referring Physician(s): Hijazi  History of Present Illness: Jonathan Le is a 69 y.o. 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 However, there is an enlarging very large abscess or 2 adjacent abscesses along the left lateral abdominal wall, involving the abdominal wall musculature, measuring up to 19 cm in greatest Diameter.  Now scheduled for upsizing of existing Left abscess drain catheter and placement of new abscess drain into large collection Pt and family aware   Past Medical History  Diagnosis Date  . Anxiety   . Hypertension   . Arthritis   . Sleep apnea     wears CPAP nightly  . Glaucoma   . CHF (congestive heart failure)     MI, with open heart surgery x 30 sdays ago per pt. (06/26/15)  . S/P CABG (coronary artery bypass graft)     Past Surgical History  Procedure Laterality Date  . Hernia repair      right  . Eye surgery      bil cataract  . Wisdom tooth extraction    . Fasciotomy foot / toe Left     foot  . Cardiac catheterization N/A 05/20/2015    Procedure: Left Heart Cath and Coronary Angiography;  Surgeon: Kathleene Hazel, MD;  Location: [facility] INVASIVE CV LAB;  Service: Cardiovascular;  Laterality: N/A;  . Coronary artery bypass graft N/A 05/22/2015    Procedure: CORONARY ARTERY BYPASS GRAFTING (CABG), ON PUMP, TIMES THREE, USING LEFT INTERNAL MAMMARY ARTERY, RIGHT GREATER SAPHENOUS VEIN HARVESTED ENDOSCOPICALLY;  Surgeon: Kerin Perna, MD;  Location: [facility] OR;  Service: Open Heart Surgery;  Laterality: N/A;   LIMA-LAD, SVG-OM, SVG-PD  . Tee without cardioversion N/A 05/22/2015    Procedure: TRANSESOPHAGEAL ECHOCARDIOGRAM (TEE);  Surgeon: Kerin Perna, MD;  Location: [facility] OR;  Service: Open Heart Surgery;  Laterality: N/A;  . Laparotomy N/A 06/18/2015    Procedure: EXPLORATORY LAPAROTOMY;  Surgeon: Emelia Loron, MD;  Location: [facility] OR;  Service: General;  Laterality: N/A;  . Laparotomy N/A 06/20/2015    Procedure: WASHOUT OF ABDOMEN, CLOSURE OF ABDOMINAL WALL WITH PLACEMENT OF WOUND VAC AND ILLEOSTOMY ;  Surgeon: Almond Lint, MD;  Location: [facility] OR;  Service: General;  Laterality: N/A;  . Tracheostomy tube placement N/A 07/03/2015    Procedure: TRACHEOSTOMY;  Surgeon: Serena Colonel, MD;  Location: [facility] OR;  Service: ENT;  Laterality: N/A;    Allergies: Levaquin; Penicillins; and Doxycycline  Medications: Prior to Admission medications   Medication Sig Start Date End Date Taking? Authorizing Provider  acetaminophen (TYLENOL) 160 MG/5ML solution Take 20.3 mLs (650 mg total) by mouth every 6 (six) hours as needed for fever. 07/25/15   Jeanella Craze, NP  Amino Acids-Protein Hydrolys (FEEDING SUPPLEMENT, PRO-STAT SUGAR FREE 64,) LIQD Place 30 mLs into feeding tube 5 (five) times daily. 07/25/15   Jeanella Craze, NP  anidulafungin 100 mg in sodium chloride 0.9 % 100 mL Inject 100 mg into the vein daily. 07/25/15   Jeanella Craze, NP  chlorhexidine gluconate (PERIDEX) 0.12 % solution 15 mLs by Mouth  Rinse route 2 (two) times daily. 07/25/15   Jeanella Craze, NP  dextrose 5 % solution Inject 10 mLs into the vein continuous. 07/25/15   Jeanella Craze, NP  insulin aspart (NOVOLOG) 100 UNIT/ML injection CBG < 70: implement hypoglycemia protocol CBG 70 - 120: 0 units CBG 121 - 150: 2 units CBG 151 - 200: 3 units CBG 201 - 250: 5 units CBG 251 - 300: 8 units CBG 301 - 350: 11 units CBG 351 - 400: 15 units CBG > 400: call MD and obtain STAT lab verification 07/25/15   Jeanella Craze, NP  norepinephrine 16 mg in  dextrose 5 % 250 mL Inject 2-50 mcg/min into the vein continuous. 07/25/15   Jeanella Craze, NP  Nutritional Supplements (FEEDING SUPPLEMENT, VITAL HIGH PROTEIN,) LIQD liquid 50 ml/hr 07/25/15   Jeanella Craze, NP  pantoprazole sodium (PROTONIX) 40 mg/20 mL PACK Place 20 mLs (40 mg total) into feeding tube daily. 07/25/15   Jeanella Craze, NP  vancomycin (VANCOCIN) 1 GM/200ML SOLN Inject 200 mLs (1,000 mg total) into the vein every 12 (twelve) hours. 07/25/15   Jeanella Craze, NP  white petrolatum (VASELINE) GEL Apply 1 application topically daily. Apply to L nare wound daily 07/08/15   Jeanella Craze, NP     No family history on file.  Social History   Social History  . Marital Status: Married    Spouse Name: N/A  . Number of Children: N/A  . Years of Education: N/A   Social History Main Topics  . Smoking status: Never Smoker   . Smokeless tobacco: Never Used  . Alcohol Use: No  . Drug Use: No  . Sexual Activity: Not Currently   Other Topics Concern  . Not on file   Social History Narrative    Review of Systems: A 12 point ROS discussed and pertinent positives are indicated in the HPI above.  All other systems are negative.  Review of Systems  Constitutional: Positive for fever. Negative for activity change and appetite change.  Gastrointestinal: Positive for abdominal distention.  Neurological: Positive for weakness.  Psychiatric/Behavioral: Negative for behavioral problems.    Vital Signs: There were no vitals taken for this visit.  Physical Exam  Cardiovascular: Normal rate and regular rhythm.   Pulmonary/Chest: Effort normal and breath sounds normal.  Abdominal: Soft. There is no tenderness.  Musculoskeletal: Normal range of motion.  Neurological: He is alert.  Skin: Skin is warm.  Psychiatric: He has a normal mood and affect. His behavior is normal. Judgment and thought content normal.  Nursing note reviewed.   Mallampati Score:     Imaging: Ct Abdomen  Pelvis Wo Contrast  08/10/2015   CLINICAL DATA:  Followup abscesses  EXAM: CT ABDOMEN AND PELVIS WITHOUT CONTRAST  TECHNIQUE: Multidetector CT imaging of the abdomen and pelvis was performed following the standard protocol without IV contrast.  COMPARISON:  07/29/2015  FINDINGS: There is moderate left pleural effusion with left lower lobe atelectasis or consolidation, similar to prior study. Visualize coronary vessels are diffusely calcified. Right base atelectasis, stable.  Liver, gallbladder, spleen, pancreas, adrenals and kidneys are unremarkable. The left kidney is somewhat displaced due to large left lateral abdominal and pelvic abscesses. Drainage catheter remains present in 1 of the lateral abdominal abscesses which is decreased slightly in size, measuring 6.6 x 6.1 cm compared to 8.2 x 7.3 cm previously. Lateral to this fluid collection, enlarging abscesses are noted involving the left lateral abdominal wall.  The entire process which may be 2 adjacent abscesses measures approximately 19 x 7.5 cm on image 45.  Anterior right abdominal abscess with drainage catheter in place again noted. The size of the fluid collection measures 9.7 x 2.3 cm on image 48 compared with 12.1 x 2.5 cm previously.  Stomach is decompressed with feeding tube in place. The tip of the feeding tube is in the duodenal bulb. Small bowel decompressed. Prior colectomy. No free air or adenopathy.  Bladder is decompressed with Foley catheter in place. No free fluid.  No acute bony abnormality or focal bone lesion.  IMPRESSION: The left lateral abscess and right anterior abscess with drainage catheters in place have decreased slightly in size since prior study. However, there is an enlarging very large abscess or 2 adjacent abscesses along the left lateral abdominal wall, involving the abdominal wall musculature, measuring up to 19 cm in greatest diameter.  Moderate left pleural effusion with left lower lobe atelectasis or infiltrate. Right  basilar opacity, likely atelectasis.  Coronary artery disease.  Prior colectomy.   Electronically Signed   By: Charlett Nose M.D.   On: 08/10/2015 15:30   Ct Abdomen Pelvis Wo Contrast  07/29/2015   CLINICAL DATA:  Evaluate for abscess  EXAM: CT ABDOMEN AND PELVIS WITHOUT CONTRAST  TECHNIQUE: Multidetector CT imaging of the abdomen and pelvis was performed following the standard protocol without IV contrast.  COMPARISON:  July 21, 2015  FINDINGS: Motor postsurgical changes of colectomy and right lower quadrant ileostomy. There is no small bowel obstruction. There is slight interval decrease of previously noted rectal pouch. 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 There is a drain within the left hemipelvis abscess, the size which is smaller compared to prior CT of July 21, 2015. There is a second small abscess not previously seen to the left of the rectal pouch measuring 4 x 4.4 cm.  The liver, spleen, pancreas, gallbladder and adrenal glands are normal. There are nonobstructing stones in bilateral kidneys. There is no hydronephrosis bilaterally. Atherosclerosis of the abdominal aorta is identified. There is no abdominal lymphadenopathy. The bladder is decompressed with Foley catheter in place.  Images of the lung bases demonstrate bilateral pleural effusions. There is consolidation of the left lower lobe with bronchograms at least in part due to atelectasis but superimposed pneumonia is not excluded. There is atelectasis of right lung base. Degenerative joint changes of the spine are identified.  IMPRESSION: The rectal pouch is smaller compared to prior exam. 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 The left hemi pelvis abscess is smaller compared to the previous exam. There is a second small abscess not previously seen to the left of the rectal pouch measuring 4 x 4.4 cm.    Electronically Signed   By: Sherian Rein M.D.   On: 07/29/2015 18:13   Ct Abdomen Pelvis Wo Contrast  07/21/2015   CLINICAL DATA:  69 year old male with history of prior colectomy for ischemic colitis presenting with septic shock and acute respiratory failure.  EXAM: CT ABDOMEN AND PELVIS WITHOUT CONTRAST  TECHNIQUE: Multidetector CT imaging of the abdomen and pelvis was performed following the standard protocol without IV contrast.  COMPARISON:  CT dated 06/26/2015  FINDINGS: Evaluation of this exam is limited in the absence of intravenous contrast. Evaluation is also limited due to streak artifact caused by patient's arms.  Partially visualized small bilateral pleural effusions with  subsegmental consolidative changes of the lung bases compatible with atelectasis versus pneumonia. There is coronary vascular calcification and CABG clips.  No intra-abdominal free air.  No free fluid identified.  The liver is grossly unremarkable. Layering high attenuating material within the gallbladder may represent sludge or small stones versus vicariously excreted contrast material from prior study. There is no pericholecystic fluid. The pancreas, spleen, adrenal glands appear unremarkable. There are small nonobstructing bilateral renal calculi measuring up to 5 mm. There is no hydronephrosis on either side. The visualized ureters appear unremarkable. The urinary bladder is decompressed around a Foley catheter. A 4 mm calcific density along the right bladder wall is similar to the prior study. Enlarged prostate gland measuring up to 6.5 cm in diameter.  Evaluation of the bowel is very limited in the absence of oral contrast. 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 There is fluid distention of the rectal pouch which has increased in size and more distended compared to the prior study. There is haziness of the fat surrounding the upper border of the rectal pouch with small pockets of air concerning for sutural  dehiscence. Multiple decompressed loops of small bowel noted within the abdomen and pelvis. A 15 x 12 cm loculated fluid collection noted in the left hemipelvis containing air and fluid. There has been interval increase in the size of this collection with significant increase in the amount of gas within this collection concerning for developing infection. An ill-defined apparent track like structure may be present extending from this collection to the rectal pouch concerning for developing fistula. There is extension of the gas into the left anterior abdominal musculature adjacent and superior to the loculated peritoneal collection. A 17 x 9 cm loculated fluid collection is noted in the right anterior peritoneum superior to the ileostomy.  Mild aortoiliac atherosclerotic disease. There is no lymphadenopathy. There is a small fat containing left inguinal hernia. Midline vertical anterior abdominal wall incisional scar. Degenerative changes of the spine. No acute fracture.  IMPRESSION: Postsurgical changes of colectomy and right lower quadrant ileostomy. No evidence of bowel obstruction. There has been interval increase in the size of fluid field rectal pouch. Small pockets of air superior to the pouch are concerning for suture dehiscence. Clinical correlation and surgical consult is advised.  Increase in the size of the left hemipelvic peritoneal collection with significant interval increase in the size of the gas within this collection concerning for superimposed infection. An ill-defined track like structure extending from this collection to the rectal pouch is concerning for developing fistula. There is extension of gas in the left anterior abdominal musculature superior to this collection.  Loculated peritoneal fluid collection in the right anterior hemiabdomen superior to the ileostomy.  Small bilateral pleural effusions with bibasilar subsegmental atelectasis/pneumonia.  These results were called by telephone  at the time of interpretation on 07/21/2015 at 3:52 am to the charge nurse, Christell Constant, who verbally acknowledged these results.   Electronically Signed   By: Elgie Collard M.D.   On: 07/21/2015 03:55   US Renal Port  07/20/2015   CLINICAL DATA:  Renal failure. Patient on vent. CT congestive heart failure.  EXAM: RENAL / URINARY TRACT ULTRASOUND COMPLETE  COMPARISON:  CT 812 1,016  FINDINGS: Right Kidney:  Length: 11.7 cm.  Normal cortical thickness.  No hydronephrosis.  Left Kidney:  Length: 10.8 cm.  Normal cortical thickness.  No hydronephrosis.  Bladder:  Appears normal for degree of bladder distention.  There several fluid collections  within the peritoneal space. These collections have lacy internal echoes consistent with a complex fluid collections. The largest in LEFT upper quadrant and aright which enters 19 x 10 x 17 cm.  IMPRESSION: 1. No hydronephrosis. 2. Multiple complex fluid collections within the peritoneal space. There was fluid within the peritoneal space on comparison CT however the collection appear larger and have internal complexity suggesting peritonitis or abscess. Recommend CT of the abdomen and pelvis with contrast (if possible) for further evaluation. Findings conveyed tocharge nurse Brie on 07/20/2015 at21:44. Attempting to contact attending physician.   Electronically Signed   By: Genevive Bi M.D.   On: 07/20/2015 21:49   Dg Chest Port 1 View  08/07/2015   CLINICAL DATA:  Status post PICC line placement  EXAM: PORTABLE CHEST - 1 VIEW  COMPARISON:  08/03/2015  FINDINGS: A new right PICC line is noted at the cavoatrial junction. The previously seen left jugular central line is been removed. A feeding catheter and tracheostomy tube are again noted and stable. The overall inspiratory effort is poor with some crowding of the vascular markings. Slight increased density is noted over the left hemi thorax which could be related to a small effusion but likely related to the poor inspiratory  effort. Cardiac shadow is stable.  IMPRESSION: Poor inspiratory effort.  New right-sided PICC line in satisfactory position.   Electronically Signed   By: Alcide Clever M.D.   On: 08/07/2015 20:14   Dg Chest Port 1 View  08/03/2015   CLINICAL DATA:  Subsequent encounter for respiratory failure  EXAM: PORTABLE CHEST - 1 VIEW  COMPARISON:  Multiple recent previous exams.  FINDINGS: 0604 hours. Lung volumes are low. There is basilar atelectasis, as before. Cardiopericardial silhouette is upper limits of normal for size to mildly enlarged. The left IJ central line remains in place with catheter tip overlying the innominate vein confluence. A feeding tube passes into the stomach although the distal tip position is not included on the film. Telemetry leads overlie the chest.  IMPRESSION: Slight improvement in basilar aeration.  Otherwise stable.   Electronically Signed   By: Kennith Center M.D.   On: 08/03/2015 07:35   Dg Chest Port 1 View  07/28/2015   CLINICAL DATA:  Respiratory failure.  EXAM: PORTABLE CHEST - 1 VIEW  COMPARISON:  07/24/2015.  FINDINGS: Tracheostomy tube, feeding tube, left IJ line in stable position. Prior CABG. Stable cardiomegaly. Low lung volumes with bibasilar atelectasis and/or infiltrates. Findings have progressed from prior exam. Bibasilar pulmonary edema from congestive heart failure could also present in this fashion. Small left pleural effusion.  IMPRESSION: 1. Lines and tubes in stable position. 2. Prior CABG.  Persistent cardiomegaly. 3. Progressive bibasilar atelectasis and/or infiltrates. Bibasilar pulmonary edema from congestive heart failure cannot be excluded. Small left pleural effusion.   Electronically Signed   By: Maisie Fus  Register   On: 07/28/2015 07:40   Dg Chest Port 1 View  07/24/2015   CLINICAL DATA:  Sepsis.  EXAM: PORTABLE CHEST - 1 VIEW  COMPARISON:  07/23/2015.  FINDINGS: Interim placement of feeding tube, its tip is below left hemidiaphragm. Tracheostomy tube and  left IJ line in stable position. Prior CABG. Stable cardiomegaly. Low lung volumes with mild subsegmental atelectasis and/or infiltrates in the left mid lung and both lung bases. Small left pleural effusion cannot be excluded. No pneumothorax.  IMPRESSION: 1. Interim placement of feeding tube is tip is below the left hemidiaphragm. Remaining lines and tubes in stable position.  2. Low lung volumes with left mid lung and bibasilar subsegmental atelectasis and/or infiltrates. Small left pleural effusion cannot be excluded.  3.  Prior CABG.  Cardiomegaly.  No pulmonary venous congestion.   Electronically Signed   By: Maisie Fus  Register   On: 07/24/2015 07:10   Dg Chest Port 1 View  07/23/2015   CLINICAL DATA:  Acute respiratory failure.  Replaced trach.  EXAM: PORTABLE CHEST - 1 VIEW  COMPARISON:  07/23/2015  FINDINGS: Tracheostomy tube has been placed, tip approximately 4 cm above the carina. The tip is difficult to visualize likely secondary to overlying sternotomy wires. Left IJ central line tip overlies the level of the superior vena cava.  The heart is enlarged. Status post median sternotomy and CABG. There is increase dense opacity at the medial left lung base which obscures the hemidiaphragm and is consistent with atelectasis or consolidation. Subsegmental atelectasis is also noted bilaterally.  IMPRESSION: 1. Interval placement of tracheostomy tube. 2. Increased opacity at the left lung base consistent was consolidation or increased atelectasis.   Electronically Signed   By: Norva Pavlov M.D.   On: 07/23/2015 14:15   Dg Chest Port 1 View  07/23/2015   CLINICAL DATA:  Septic shock.  EXAM: PORTABLE CHEST - 1 VIEW  COMPARISON:  07/22/2015  FINDINGS: Endotracheal tube, left internal jugular venous catheter, with tip projecting over the junction of the brachiocephalic veins, and enteric catheter with tip collimated off the image, are stable. Median sternotomy wires and postsurgical changes from CABG are also  stable.  Cardiomediastinal silhouette is stably enlarged. Mediastinal contours appear intact.  There is no evidence of focal airspace consolidation, pleural effusion or pneumothorax. There are low lung volumes with bibasilar atelectasis.  Osseous structures are without acute abnormality. Soft tissues are grossly normal.  IMPRESSION: Low lung volumes with bibasilar atelectasis.  Stable appearance of the supporting lines and tubes.   Electronically Signed   By: Ted Mcalpine M.D.   On: 07/23/2015 07:24   Dg Chest Port 1 View  07/22/2015   CLINICAL DATA:  Septic shock, respiratory failure, intubated patient.  EXAM: PORTABLE CHEST - 1 VIEW  COMPARISON:  Portable chest x-ray of July 21, 2015  FINDINGS: The lungs are adequately inflated. Increased bibasilar atelectasis is present. The cardiac silhouette remains enlarged. The central pulmonary vascularity is mildly prominent. The endotracheal tube tip lies 4.4 cm above the carina. The left internal jugular venous catheter tip projects at junction of the right and left brachiocephalic veins. The right internal jugular catheter is been removed. The esophagogastric tube tip projects below the inferior margin of the image.  IMPRESSION: Worsening bibasilar atelectasis. The support tubes are in reasonable position.   Electronically Signed   By: David  [place] M.D.   On: 07/22/2015 07:02   Dg Chest Port 1 View  07/21/2015   CLINICAL DATA:  Central catheter placement  EXAM: PORTABLE CHEST - 1 VIEW  COMPARISON:  Study obtained earlier in the day  FINDINGS: New central catheter is present with tip in the superior vena cava. A second central catheter from the right side extends into the right neck region, stable compared to earlier in the day. Endotracheal tube tip is 3.4 cm above the carina. Nasogastric tube tip and side port are in the region of the distal stomach. No pneumothorax. There is patchy bibasilar atelectasis, slightly less than earlier today. Lungs elsewhere  clear. Heart is upper normal in size with pulmonary vascularity within normal limits. There is no adenopathy. Patient  is status post coronary artery bypass grafting.  IMPRESSION: Tube and catheter positions as described without pneumothorax. Note that the right central catheter extends into the right neck region with the tip not seen.  There is patchy bibasilar atelectasis, slightly less compared to earlier in the day. No change in cardiac silhouette.   Electronically Signed   By: Bretta Bang III M.D.   On: 07/21/2015 11:31   Dg Chest Port 1 View  07/21/2015   CLINICAL DATA:  Respiratory failure, congestive heart failure portable chest x-ray of 07/20/2015  EXAM: PORTABLE CHEST - 1 VIEW  COMPARISON:  None.  FINDINGS: The tip of the endotracheal tube is approximately 4.4 cm above the carina. There has been slight worsening of bibasilar in linear atelectasis. Mild cardiomegaly is stable. The right central venous line extends cephalad, probably within the right internal jugular vein, unchanged. The tip is not visualized.  IMPRESSION: 1. Diminished aeration with increasing bibasilar atelectasis. 2. Tip of endotracheal tube approximately 4.4 cm above the carina. 3. As before, the right IJ central venous line extends into the neck.   Electronically Signed   By: Dwyane Dee M.D.   On: 07/21/2015 08:13   Dg Chest Port 1 View  07/20/2015   CLINICAL DATA:  Endotracheal tube and NG tube placement  EXAM: PORTABLE CHEST - 1 VIEW  COMPARISON:  07/19/2015  FINDINGS: There is endotracheal tube with the tip at the right mainstem bronchus; recommend retracting the endotracheal tube 2.5 cm.  There is mild bilateral interstitial prominence. There is left basilar linear airspace disease likely reflecting atelectasis. There is no pleural effusion or pneumothorax. There is stable mild cardiomegaly. There is evidence of prior CABG.  The osseous structures are unremarkable.  IMPRESSION: 1. Endotracheal tube with the tip at the  right mainstem bronchus; recommend retracting the endotracheal tube 2.5 cm.   Electronically Signed   By: Elige Ko   On: 07/20/2015 18:57   Dg Chest Port 1 View  07/19/2015   CLINICAL DATA:  Rule out pneumonia.  EXAM: PORTABLE CHEST - 1 VIEW  COMPARISON:  07/07/2015  FINDINGS: There is bibasilar hazy airspace disease. There is no pleural effusion or pneumothorax. The heart mediastinum are stable. There is evidence of prior CABG.  The osseous structures are unremarkable.  IMPRESSION: Bibasilar hazy airspace disease which may reflect atelectasis versus pneumonia.   Electronically Signed   By: Elige Ko   On: 07/19/2015 14:36   Dg Abd Portable 1v  07/25/2015   CLINICAL DATA:  NG tube placement.  EXAM: PORTABLE ABDOMEN - 1 VIEW  COMPARISON:  07/23/2015  FINDINGS: Feeding tube with tip in the right upper quadrant consistent with location in the distal stomach or possible proximal duodenum. Paucity of gas in the visualized abdomen. Degenerative changes in the spine.  IMPRESSION: Feeding tube tip is in the right upper quadrant consistent with location in the distal stomach.   Electronically Signed   By: Burman Nieves M.D.   On: 07/25/2015 23:17   Dg Abd Portable 1v  07/23/2015   CLINICAL DATA:  NG tube placement.  EXAM: PORTABLE ABDOMEN - 1 VIEW  COMPARISON:  CT exam 07/21/2015  FINDINGS: Nasogastric tube has been removed. A feeding tube has been placed, tip overlying the level of the distal stomach. Visualized bowel gas pattern is nonobstructed. Pigtail type catheters are identified in the right and left lower quadrants.  IMPRESSION: Interval placement of feeding tube tip overlying the level of the distal stomach.   Electronically  Signed   By: Norva Pavlov M.D.   On: 07/23/2015 16:15   Dg Abd Portable 1v  07/21/2015   CLINICAL DATA:  Orogastric tube placement  EXAM: PORTABLE ABDOMEN - 1 VIEW  COMPARISON:  None.  FINDINGS: The orogastric tube is not well seen. There is a tube along the superior  aspect of the image with what appears to be a side-port slightly to the right of midline suggesting that the orogastric tube tip and side port are in the distal stomach. Bowel gas pattern unremarkable. There is degenerative change in the lumbar spine.  IMPRESSION: Orogastric tube less than optimally seen. Suspect tube tip and side port in distal stomach, although a repeat study centered over the upper abdomen is advised to confirm this observation. Bowel gas pattern which is visualized appears unremarkable.   Electronically Signed   By: Bretta Bang III M.D.   On: 07/21/2015 08:05   Dg Abd Portable 1v  07/20/2015   CLINICAL DATA:  Nasogastric tube placement.  EXAM: PORTABLE ABDOMEN - 1 VIEW  COMPARISON:  07/03/2015  FINDINGS: Nasogastric tube is not visible despite the history. Endotracheal tube is in place, tip approximately 2 cm above carina. There are coarse markings in the lungs.  IMPRESSION: No enteric tube identified.   Electronically Signed   By: Norva Pavlov M.D.   On: 07/20/2015 18:15   Dg Hand Complete Left  07/15/2015   CLINICAL DATA:  Pain over the fifth metacarpal  EXAM: LEFT HAND - COMPLETE 3+ VIEW  COMPARISON:  None.  FINDINGS: There is no acute fracture. There is widening of the scapholunate distance with rotation of the lunate and proximal migration of the capitate as can be seen with SLAC wrist. 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 There is calcification adjacent to the ulnar styloid process dorsally. There is no dislocation. There is no lytic or sclerotic osseous lesion. There is no soft tissue abnormality. There is peripheral vascular atherosclerotic disease.  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   Electronically Signed   By: Elige Ko   On: 07/15/2015 16:15   Ct Image Guided Drainage By Percutaneous Catheter  07/21/2015   CLINICAL DATA:  Right and left abdominal abscess is  EXAM: CT IMAGE GUIDED DRAINAGE BY PERCUTANEOUS CATHETER  FLUOROSCOPY  TIME:  None  MEDICATIONS AND MEDICAL HISTORY: Patient is intubated and sedated  ANESTHESIA/SEDATION: Not applicable  CONTRAST:  None  PROCEDURE: The procedure, risks, benefits, and alternatives were explained to the patient. Questions regarding the procedure were encouraged and answered. The patient understands and consents to the procedure.  The left abdomen was prepped with Betadine in a sterile fashion, and a sterile drape was applied covering the operative field. A sterile gown and sterile gloves were used for the procedure.  Under CT guidance, an 18 gauge needle was inserted into the left abdominal fluid collection. After aspirating serosanguineous fluid, the needle was removed over an Amplatz wire. A 12 French dilator followed by a 12 [place] drain was inserted. The drain was looped and string fixed in the fluid collection. It was sewn to the skin then attached to a gravity drainage bag.  The identical procedure was performed in the right abdomen for the right abdominal fluid collection. This yielded cloudy yellowish fluid.  FINDINGS: Images demonstrate 19 French drain placement into right and left abdominal fluid collections as described.  COMPLICATIONS: None  IMPRESSION: Bilateral abdominal abscess strain as described above yielding serosanguineous fluid from the left abdominal fluid  collection and cloudy yellow fluid on the right. Bilateral 12 French drains were utilized.   Electronically Signed   By: Jolaine Click M.D.   On: 07/21/2015 15:06   Ct Image Guided Drainage By Percutaneous Catheter  07/21/2015   CLINICAL DATA:  Right and left abdominal abscess is  EXAM: CT IMAGE GUIDED DRAINAGE BY PERCUTANEOUS CATHETER  FLUOROSCOPY TIME:  None  MEDICATIONS AND MEDICAL HISTORY: Patient is intubated and sedated  ANESTHESIA/SEDATION: Not applicable  CONTRAST:  None  PROCEDURE: The procedure, risks, benefits, and alternatives were explained to the patient. Questions regarding the procedure were encouraged and  answered. The patient understands and consents to the procedure.  The left abdomen was prepped with Betadine in a sterile fashion, and a sterile drape was applied covering the operative field. A sterile gown and sterile gloves were used for the procedure.  Under CT guidance, an 18 gauge needle was inserted into the left abdominal fluid collection. After aspirating serosanguineous fluid, the needle was removed over an Amplatz wire. A 12 French dilator followed by a 12 [place] drain was inserted. The drain was looped and string fixed in the fluid collection. It was sewn to the skin then attached to a gravity drainage bag.  The identical procedure was performed in the right abdomen for the right abdominal fluid collection. This yielded cloudy yellowish fluid.  FINDINGS: Images demonstrate 23 French drain placement into right and left abdominal fluid collections as described.  COMPLICATIONS: None  IMPRESSION: Bilateral abdominal abscess strain as described above yielding serosanguineous fluid from the left abdominal fluid collection and cloudy yellow fluid on the right. Bilateral 12 French drains were utilized.   Electronically Signed   By: Jolaine Click M.D.   On: 07/21/2015 15:06    Labs:  CBC:  Recent Labs  08/02/15 0620 08/03/15 0630 08/06/15 0711 08/07/15 0700  WBC 12.2* 10.2 7.2 8.7  HGB 7.0* 7.8* 7.1* 7.4*  HCT 22.8* 25.6* 23.0* 24.0*  PLT 267 246 399 418*    COAGS:  Recent Labs  07/21/15 0815  07/23/15 1200 07/25/15 0419 07/30/15 0627  08/08/15 0845 08/09/15 0659 08/10/15 0625 08/11/15 0753  INR 1.65*  < > 2.62* 1.48 1.35  < > 1.37 1.45 1.57* 1.78*  APTT 29  --  38* 28 31  --   --   --   --   --   < > = values in this interval not displayed.  BMP:  Recent Labs  07/31/15 0645 08/02/15 0620 08/03/15 0630 08/07/15 0700  NA 139 138 138 131*  K 4.1 3.7 3.4* 3.4*  CL 107 109 108 102  CO2 GLUCOSE 134* 131* 128* 121*  BUN 25* CALCIUM 7.6* 7.8* 7.9*  7.6*  CREATININE 0.74 0.51* 0.45* 0.45*  GFRNONAA >60 >60 >60 >60  GFRAA >60 >60 >60 >60    LIVER FUNCTION TESTS:  Recent Labs  07/22/15 0445 07/23/15 0600 07/24/15 0345 07/25/15 0419 07/27/15 0500 07/28/15 0630 08/02/15 0620  BILITOT 1.0 1.0 0.7 0.9  --   --   --   AST 33 30 40 35  --   --   --   ALT 19 15* 15* 14*  --   --   --   ALKPHOS 151* 154* 167* 199*  --   --   --   PROT 4.9* 4.6* 4.3* 4.5*  --   --   --   ALBUMIN 1.0* <1.0* <1.0* <1.0* 1.2* 1.4* 1.2*  TUMOR MARKERS: No results for input(s): AFPTM, CEA, CA199, CHROMGRNA in the last 8760 hours.  Assessment and Plan:  Bad abscesses after colectomy 06/18/2015 Original drains placed 9/7---now new collection at Left Scheduled for upsize of existing L drain and new placement at new Left collection Risks and Benefits discussed with the patient including bleeding, infection, damage to adjacent structures, bowel perforation/fistula connection, and sepsis. All of the patient's questions were answered, patient is agreeable to proceed. Consent signed and in chart.   Thank you for this interesting consult.  I greatly enjoyed meeting JAYLENE SCHROM and look forward to participating in their care.  A copy of this report was sent to the requesting provider on this date.  Signed: TURPIN,PAMELA A 08/11/2015, 1:27 PM   I spent a total of 40 Minutes    in face to face in clinical consultation, greater than 50% of which was counseling/coordinating care for abd abscess drain

## 2015-08-12 ENCOUNTER — Other Ambulatory Visit (HOSPITAL_COMMUNITY): Payer: Self-pay

## 2015-08-12 LAB — PROTIME-INR
INR: 1.72 — AB (ref 0.00–1.49)
PROTHROMBIN TIME: 20.1 s — AB (ref 11.6–15.2)

## 2015-08-12 MED ORDER — FENTANYL CITRATE (PF) 100 MCG/2ML IJ SOLN
INTRAMUSCULAR | Status: AC
  Filled 2015-08-12: qty 4

## 2015-08-12 MED ORDER — MIDAZOLAM HCL 2 MG/2ML IJ SOLN
INTRAMUSCULAR | Status: AC
  Filled 2015-08-12: qty 4

## 2015-08-12 MED ORDER — MIDAZOLAM HCL 2 MG/2ML IJ SOLN
INTRAMUSCULAR | Status: AC | PRN
  Administered 2015-08-12: 1 mg via INTRAVENOUS

## 2015-08-12 MED ORDER — FENTANYL CITRATE (PF) 100 MCG/2ML IJ SOLN
INTRAMUSCULAR | Status: AC | PRN
  Administered 2015-08-12 (×2): 25 ug via INTRAVENOUS

## 2015-08-12 MED ORDER — LIDOCAINE HCL 1 % IJ SOLN
INTRAMUSCULAR | Status: AC
  Filled 2015-08-12: qty 20

## 2015-08-12 MED ORDER — SODIUM CHLORIDE 0.9 % IV SOLN
INTRAVENOUS | Status: AC | PRN
  Administered 2015-08-12: 30 mL/h via INTRAVENOUS

## 2015-08-12 NOTE — Sedation Documentation (Signed)
Patient denies pain and is resting comfortably.  

## 2015-08-12 NOTE — Sedation Documentation (Signed)
Unable to monitor CO2 due to trach collar

## 2015-08-12 NOTE — Procedures (Signed)
Ultrasound demonstrated complex fluid collections along the left abdominal wall c/w recent CT.  Large amount of cloudy yellow fluid was aspirated from the existing 12 Fr drain.  Two new drains were placed with ultrasound guidance. 12 and 14 Fr drains were placed.  Between the three drains, 950 ml of cloudy yellow fluid was removed.  Ultrasound demonstrated decreased sizes of the collections but not complete resolution.  Drains attached to suction bulbs and will do routine flushing.

## 2015-08-13 ENCOUNTER — Other Ambulatory Visit (HOSPITAL_COMMUNITY): Payer: Self-pay

## 2015-08-13 DIAGNOSIS — I82409 Acute embolism and thrombosis of unspecified deep veins of unspecified lower extremity: Secondary | ICD-10-CM

## 2015-08-13 LAB — PROTIME-INR
INR: 1.89 — ABNORMAL HIGH (ref 0.00–1.49)
Prothrombin Time: 21.6 seconds — ABNORMAL HIGH (ref 11.6–15.2)

## 2015-08-13 NOTE — Progress Notes (Signed)
Name: Jonathan Le MRN: [id number] DOB: 1946-07-05    ADMISSION DATE:  07/25/2015 CONSULTATION DATE:  9/12  REFERRING MD :  Stanton Kidney  CHIEF COMPLAINT:  Vent management   BRIEF PATIENT DESCRIPTION:    69 yo male was hospitalized in August 2016 for ischemic bowel s/p colectomy.  Developed respiratory failure with failure to wean from vent, and had trach by ENT on 8/19.  He developed aspiration with recurrent VDRF and PCCM consulted.  SIGNIFICANT EVENTS  8/4 Total abdominal colectomy with abdominal vac placement 8/6 Exploratory laparotomy, removal of prior packs, closure of open abdomen, ileostomy 8/7 high pressors 8/8 self extubated 8/9 resp distress , required bipap , lasix 8/13 had to restart levophed for shock overnight. 8/16 reintubated, primarily for MS, agitation, tachypnea 8/19 Trach placed 8/22 tolerating TC. 8/23 to [facility] 9/05 PCCM called back for aspiration PNA and respiratory failure. Pt reintubated. Later transferred to [facility] ICU for worsening septic shock. 9/06 Emergent drainage abscess IR, refractory shock 9/07 Improved pressors 9/08 Trach placed 9/10 Cleared for transfer back to [facility] for rehab and weaning efforts.  9/22: starting ATC trials and PMV trials  9/27 downsized trach to 6 xlt cuffed  STUDIES:  Echo 8/4 >>> EF 25 to 30% CT chest / abd/ pelv 8/4 >>> heptic flexure pneumatosis, b/l renal stones Renal US 9/5 >>> No hydronephrosis, multiple complex fluid collections within the peritoneal space CT A / P 9/5 >>>large abscess abdo  SUBJECTIVE:  No distress   VITAL SIGNS: sats 99% on n/c  PHYSICAL EXAMINATION: General:  Chronically ill appearing male, NAD, no distress  Neuro:  Awake, interactive, moves all ext. Tries to communicate  HEENT:  Mm dry, no JVD, trach & xlt unremarkable, trach w/ PMV in place   Cardiovascular:  s1s2rrr Lungs:  resps even non labored w/ scattered rhonchi  Abdomen:  Round, soft, RLQ ostomy with drainage, midline incision  dressing c/d, VAC has been removed  Musculoskeletal:  Warm and dry, 2+ BLE edema, scrotal edema   CMP Latest Ref Rng 08/07/2015 08/03/2015 08/02/2015  Glucose 65 - 99 mg/dL 409(W) 119(J) 478(G)  BUN 6 - 20 mg/dL 11 15 17   Creatinine 0.61 - 1.24 mg/dL 9.56(O) 1.30(Q) 6.57(Q)  Sodium 135 - 145 mmol/L 131(L) 138 138  Potassium 3.5 - 5.1 mmol/L 3.4(L) 3.4(L) 3.7  Chloride 101 - 111 mmol/L 102 108 109  CO2 22 - 32 mmol/L 25 25 24   Calcium 8.9 - 10.3 mg/dL 7.6(L) 7.9(L) 7.8(L)  Total Protein 6.5 - 8.1 g/dL - - -  Total Bilirubin 0.3 - 1.2 mg/dL - - -  Alkaline Phos 38 - 126 U/L - - -  AST 15 - 41 U/L - - -  ALT 17 - 63 U/L - - -    CBC Latest Ref Rng 08/07/2015 08/06/2015 08/03/2015  WBC 4.0 - 10.5 K/uL 8.7 7.2 10.2  Hemoglobin 13.0 - 17.0 g/dL 7.4(L) 7.1(L) 7.8(L)  Hematocrit 39.0 - 52.0 % 24.0(L) 23.0(L) 25.6(L)  Platelets 150 - 400 K/uL 418(H) 399 246    Ct Abdomen Pelvis Wo Contrast  08/10/2015   CLINICAL DATA:  Followup abscesses  EXAM: CT ABDOMEN AND PELVIS WITHOUT CONTRAST  TECHNIQUE: Multidetector CT imaging of the abdomen and pelvis was performed following the standard protocol without IV contrast.  COMPARISON:  07/29/2015  FINDINGS: There is moderate left pleural effusion with left lower lobe atelectasis or consolidation, similar to prior study. Visualize coronary vessels are diffusely calcified. Right base atelectasis, stable.  Liver, gallbladder, spleen,  pancreas, adrenals and kidneys are unremarkable. The left kidney is somewhat displaced due to large left lateral abdominal and pelvic abscesses. Drainage catheter remains present in 1 of the lateral abdominal abscesses which is decreased slightly in size, measuring 6.6 x 6.1 cm compared to 8.2 x 7.3 cm previously. Lateral to this fluid collection, enlarging abscesses are noted involving the left lateral abdominal wall. The entire process which may be 2 adjacent abscesses measures approximately 19 x 7.5 cm on image 45.  Anterior right  abdominal abscess with drainage catheter in place again noted. The size of the fluid collection measures 9.7 x 2.3 cm on image 48 compared with 12.1 x 2.5 cm previously.  Stomach is decompressed with feeding tube in place. The tip of the feeding tube is in the duodenal bulb. Small bowel decompressed. Prior colectomy. No free air or adenopathy.  Bladder is decompressed with Foley catheter in place. No free fluid.  No acute bony abnormality or focal bone lesion.  IMPRESSION: The left lateral abscess and right anterior abscess with drainage catheters in place have decreased slightly in size since prior study. However, there is an enlarging very large abscess or 2 adjacent abscesses along the left lateral abdominal wall, involving the abdominal wall musculature, measuring up to 19 cm in greatest diameter.  Moderate left pleural effusion with left lower lobe atelectasis or infiltrate. Right basilar opacity, likely atelectasis.  Coronary artery disease.  Prior colectomy.   Electronically Signed   By: Charlett Nose M.D.   On: 08/10/2015 15:30   Ir Sinus/fist Tube Chk-non Gi  08/13/2015   CLINICAL DATA:  69 year old with multiple abscess collections along the left abdominal wall and left abdominal cavity. Patient already has an existing catheter in this region.  EXAM: PLACEMENT OF PERCUTANEOUS ABSCESS DRAIN IN LEFT ABDOMINAL ABSCESS WITH ULTRASOUND GUIDANCE  PLACEMENT OF PERCUTANEOUS ABSCESS DRAIN IN LEFT ABDOMINAL ABSCESS WITH ULTRASOUND GUIDANCE  Physician: Rachelle Hora. Henn, MD  FLUOROSCOPY TIME:  10.5 mGy, 30 seconds  MEDICATIONS AND MEDICAL HISTORY: 1 mg Versed, 50 mcg fentanyl. A radiology nurse monitored the patient for moderate sedation.  ANESTHESIA/SEDATION: Moderate sedation time: 25 minutes  CONTRAST:  None  PROCEDURE: The procedure was explained to the patient. The risks and benefits of the procedure were discussed and the patient's questions were addressed. Informed consent was obtained from the patient. The  patient was placed supine on the interventional table. Ultrasound was used to evaluate the left side of the abdomen. Multiple complex fluid collections identified along the left abdominal wall and left abdominal cavity. The existing catheter was identified. The existing catheter and surrounding skin were prepped and draped in sterile fashion. Maximal barrier sterile technique was utilized including caps, mask, sterile gowns, sterile gloves, sterile drape, hand hygiene and skin antiseptic. Cloudy yellow fluid was aspirated from the existing catheter. Skin was anesthetized with 1% lidocaine. Using ultrasound guidance, a 19 gauge Yueh catheter was directed into the largest collection and a stiff Amplatz wire was placed. The tract was dilated to accommodate a 14 [place] multipurpose drain. A large amount of cloudy purulent yellow fluid was aspirated. Attention was directed to deeper left abdominal fluid collection. Yueh catheter was directed into this collection with ultrasound guidance and a stiff Amplatz wire was placed. The tract was dilated to accommodate a 12 [place] multipurpose drain. Cloudy yellow purulent fluid was also removed from this collection. Between the three drains, 950 mL of cloudy yellow fluid was aspirated. The new catheters were sutured to the skin. All  of the catheters were attached to a suction bulb. Sterile dressing placed over the drains.  FINDINGS: Complex fluid collections throughout the left of the abdominal cavity and left abdominal wall region. Two new percutaneous catheters were placed with ultrasound guidance. Large amount of yellow cloudy fluid was removed from all the collections. The appearance and consistency of the fluid collections are similar from the three drains. Total of 950 mL of yellow cloudy fluid was removed between the three drains.  Estimated blood loss: Minimal  COMPLICATIONS: None  IMPRESSION: Placement of two new abscess drains in the left abdominal cavity/abdominal wall  abscesses. Large amount of fluid was also aspirated from the existing catheter. Total of 950 mL of fluid was removed from the 3 catheters.  Plan for routine flushing of these catheters in order to help drainage. The abscess collections are complex and there may be small undrained collections. Will need follow-up CT imaging when drainage slows down or stops from these catheters.   Electronically Signed   By: Richarda Overlie M.D.   On: 08/13/2015 08:38   Ir Sinus/fist Tube Chk-non Gi  08/13/2015   CLINICAL DATA:  69 year old with multiple abscess collections along the left abdominal wall and left abdominal cavity. Patient already has an existing catheter in this region.  EXAM: PLACEMENT OF PERCUTANEOUS ABSCESS DRAIN IN LEFT ABDOMINAL ABSCESS WITH ULTRASOUND GUIDANCE  PLACEMENT OF PERCUTANEOUS ABSCESS DRAIN IN LEFT ABDOMINAL ABSCESS WITH ULTRASOUND GUIDANCE  Physician: Rachelle Hora. Henn, MD  FLUOROSCOPY TIME:  10.5 mGy, 30 seconds  MEDICATIONS AND MEDICAL HISTORY: 1 mg Versed, 50 mcg fentanyl. A radiology nurse monitored the patient for moderate sedation.  ANESTHESIA/SEDATION: Moderate sedation time: 25 minutes  CONTRAST:  None  PROCEDURE: The procedure was explained to the patient. The risks and benefits of the procedure were discussed and the patient's questions were addressed. Informed consent was obtained from the patient. The patient was placed supine on the interventional table. Ultrasound was used to evaluate the left side of the abdomen. Multiple complex fluid collections identified along the left abdominal wall and left abdominal cavity. The existing catheter was identified. The existing catheter and surrounding skin were prepped and draped in sterile fashion. Maximal barrier sterile technique was utilized including caps, mask, sterile gowns, sterile gloves, sterile drape, hand hygiene and skin antiseptic. Cloudy yellow fluid was aspirated from the existing catheter. Skin was anesthetized with 1% lidocaine. Using  ultrasound guidance, a 19 gauge Yueh catheter was directed into the largest collection and a stiff Amplatz wire was placed. The tract was dilated to accommodate a 14 [place] multipurpose drain. A large amount of cloudy purulent yellow fluid was aspirated. Attention was directed to deeper left abdominal fluid collection. Yueh catheter was directed into this collection with ultrasound guidance and a stiff Amplatz wire was placed. The tract was dilated to accommodate a 12 [place] multipurpose drain. Cloudy yellow purulent fluid was also removed from this collection. Between the three drains, 950 mL of cloudy yellow fluid was aspirated. The new catheters were sutured to the skin. All of the catheters were attached to a suction bulb. Sterile dressing placed over the drains.  FINDINGS: Complex fluid collections throughout the left of the abdominal cavity and left abdominal wall region. Two new percutaneous catheters were placed with ultrasound guidance. Large amount of yellow cloudy fluid was removed from all the collections. The appearance and consistency of the fluid collections are similar from the three drains. Total of 950 mL of yellow cloudy fluid was removed between the  three drains.  Estimated blood loss: Minimal  COMPLICATIONS: None  IMPRESSION: Placement of two new abscess drains in the left abdominal cavity/abdominal wall abscesses. Large amount of fluid was also aspirated from the existing catheter. Total of 950 mL of fluid was removed from the 3 catheters.  Plan for routine flushing of these catheters in order to help drainage. The abscess collections are complex and there may be small undrained collections. Will need follow-up CT imaging when drainage slows down or stops from these catheters.   Electronically Signed   By: Richarda Overlie M.D.   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 Discussion:  Looks good. Having some difficulty w/ PMV trials. Still requiring vent at night.  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 Generalized deconditioning. Plan: Per select  Ongoing rehab efforts  Surgery following    We will see now once a week.. Continue rehab efforts.   Simonne Martinet ACNP-BC [facility] Pulmonary/Critical Care Pager # [phone] OR # [phone] if no answer  Attending Note:  Above note edited.  I reviewed CXR myself, tracheostomy in place, bibasilar atelectasis noted.  Discussed with PCCM-NP and SSH-MD.  69 year old male intubated for inability to protect airway then eventually trached with prolonged illness. On exam, debilitated, older than stated age male with trach with no leak. Very deconditioned.  PULMONARY ETT 8/4 >>8/8 ETT 8/16 >> 8/19 Trach 8/19 (Dr Pollyann Kennedy) >> self decannulated exact date unknown. ETT 9/5>>> A:  VDRF - reintubated 9/5 for aspiration and inability to protect airway. OSA. Aspiration PNA. P:  Continue ATC with PMV Do not downsize trach until improvement in physical conditioning Titrate O2 for sat of 88-92%.  INFECTIOUS A:  Septic shock - aspiration PNA + intraabdominal process (peritonitis vs abdominal abscess). R LE ulcer, chronic. P: Continue Vanc (started 9/6) Aningulofungin 9/7 plan for 7 days total complete Continue current  abscess drain management per interventional radiology Consider Repeat CT abd when drainage decreased (per guidance of IR) L IJ CVL initiated 9/6, consider d/c within 14 days   DVT: - Heparin.  Hypokalemia: - Replace and recheck as ordered.  Patient seen and examined, agree with above note. I dictated the care and orders written for this patient under my direction.  Alyson Reedy, MD [phone]

## 2015-08-14 LAB — PROTIME-INR
INR: 1.91 — AB (ref 0.00–1.49)
Prothrombin Time: 21.8 seconds — ABNORMAL HIGH (ref 11.6–15.2)

## 2015-08-15 LAB — PROTIME-INR
INR: 2.27 — ABNORMAL HIGH (ref 0.00–1.49)
Prothrombin Time: 24.8 seconds — ABNORMAL HIGH (ref 11.6–15.2)

## 2015-08-16 ENCOUNTER — Other Ambulatory Visit (HOSPITAL_COMMUNITY): Payer: Self-pay

## 2015-08-16 LAB — RENAL FUNCTION PANEL
ALBUMIN: 1.2 g/dL — AB (ref 3.5–5.0)
ANION GAP: 6 (ref 5–15)
BUN: 6 mg/dL (ref 6–20)
CHLORIDE: 94 mmol/L — AB (ref 101–111)
CO2: 24 mmol/L (ref 22–32)
Calcium: 7.3 mg/dL — ABNORMAL LOW (ref 8.9–10.3)
Creatinine, Ser: 0.42 mg/dL — ABNORMAL LOW (ref 0.61–1.24)
GFR calc Af Amer: >60 mL/min (ref >60)
GFR calc non Af Amer: >60 mL/min (ref >60)
GLUCOSE: 391 mg/dL — AB (ref 65–99)
PHOSPHORUS: 2.9 mg/dL (ref 2.5–4.6)
POTASSIUM: 3.3 mmol/L — AB (ref 3.5–5.1)
Sodium: 124 mmol/L — ABNORMAL LOW (ref 135–145)

## 2015-08-16 LAB — PROCALCITONIN: Procalcitonin: <0.1 ng/mL

## 2015-08-16 LAB — CBC
HCT: 25.6 % — ABNORMAL LOW (ref 39.0–52.0)
Hemoglobin: 7.5 g/dL — ABNORMAL LOW (ref 13.0–17.0)
MCH: 26.6 pg (ref 26.0–34.0)
MCHC: 29.3 g/dL — AB (ref 30.0–36.0)
MCV: 90.8 fL (ref 78.0–100.0)
PLATELETS: 387 10*3/uL (ref 150–400)
RBC: 2.82 MIL/uL — ABNORMAL LOW (ref 4.22–5.81)
RDW: 18.5 % — AB (ref 11.5–15.5)
WBC: 9.2 10*3/uL (ref 4.0–10.5)

## 2015-08-16 LAB — PROTIME-INR
INR: 2.82 — ABNORMAL HIGH (ref 0.00–1.49)
PROTHROMBIN TIME: 29.2 s — AB (ref 11.6–15.2)

## 2015-08-16 LAB — SEDIMENTATION RATE: Sed Rate: 70 mm/hr — ABNORMAL HIGH (ref 0–16)

## 2015-08-16 LAB — BRAIN NATRIURETIC PEPTIDE: B NATRIURETIC PEPTIDE 5: 528.3 pg/mL — AB (ref 0.0–100.0)

## 2015-08-17 ENCOUNTER — Other Ambulatory Visit (HOSPITAL_COMMUNITY): Payer: Self-pay

## 2015-08-17 LAB — BASIC METABOLIC PANEL
ANION GAP: 4 — AB (ref 5–15)
BUN: <5 mg/dL — ABNORMAL LOW (ref 6–20)
CALCIUM: 7.6 mg/dL — AB (ref 8.9–10.3)
CO2: 27 mmol/L (ref 22–32)
Chloride: 103 mmol/L (ref 101–111)
Creatinine, Ser: 0.35 mg/dL — ABNORMAL LOW (ref 0.61–1.24)
GFR calc Af Amer: >60 mL/min (ref >60)
GFR calc non Af Amer: >60 mL/min (ref >60)
GLUCOSE: 126 mg/dL — AB (ref 65–99)
POTASSIUM: 3.5 mmol/L (ref 3.5–5.1)
Sodium: 134 mmol/L — ABNORMAL LOW (ref 135–145)

## 2015-08-17 LAB — MAGNESIUM: Magnesium: 1.4 mg/dL — ABNORMAL LOW (ref 1.7–2.4)

## 2015-08-17 LAB — PROTIME-INR
INR: 3.69 — ABNORMAL HIGH (ref 0.00–1.49)
PROTHROMBIN TIME: 35.8 s — AB (ref 11.6–15.2)

## 2015-08-18 DIAGNOSIS — R5381 Other malaise: Secondary | ICD-10-CM | POA: Diagnosis not present

## 2015-08-18 DIAGNOSIS — J9620 Acute and chronic respiratory failure, unspecified whether with hypoxia or hypercapnia: Secondary | ICD-10-CM | POA: Diagnosis not present

## 2015-08-18 DIAGNOSIS — Z930 Tracheostomy status: Secondary | ICD-10-CM | POA: Diagnosis not present

## 2015-08-18 LAB — PROTIME-INR
INR: 3.04 — ABNORMAL HIGH (ref 0.00–1.49)
Prothrombin Time: 30.9 s — ABNORMAL HIGH (ref 11.6–15.2)

## 2015-08-18 NOTE — Progress Notes (Signed)
Name: Jonathan Le MRN: [id number] DOB: May 04, 1946    ADMISSION DATE:  07/25/2015 CONSULTATION DATE:  9/12  REFERRING MD :  Stanton Kidney  CHIEF COMPLAINT:  Vent management   BRIEF PATIENT DESCRIPTION:   69 y/o male was hospitalized in August 2016 for ischemic bowel s/p colectomy.  Developed respiratory failure with failure to wean from vent, and had trach by ENT on 8/19.  He developed aspiration with recurrent VDRF and PCCM consulted.  SIGNIFICANT EVENTS  8/04  Total abdominal colectomy with abdominal vac placement 8/06  Exploratory laparotomy, removal of prior packs, closure of open abdomen, ileostomy 8/07  high pressors 8/08  self extubated 8/09 resp distress , required bipap , lasix 8/13  had to restart levophed for shock overnight. 8/16  reintubated, primarily for MS, agitation, tachypnea 8/19  Trach placed 8/22  tolerating TC. 8/23  to [facility] 9/05  PCCM called back for aspiration PNA and respiratory failure. Pt reintubated. Later transferred to [facility] ICU for worsening septic shock. 9/06  Emergent drainage abscess IR, refractory shock 9/07  Improved pressors 9/08  Trach placed 9/10  Cleared for transfer back to [facility] for rehab and weaning efforts.  9/22  starting ATC trials and PMV trials  9/27  downsized trach to 6 xlt cuffed  STUDIES:  Echo 8/4 >>> EF 25 to 30% CT chest / abd/ pelv 8/4 >>> heptic flexure pneumatosis, b/l renal stones Renal US 9/5 >>> No hydronephrosis, multiple complex fluid collections within the peritoneal space CT A / P 9/5 >>>large abscess abdo  SUBJECTIVE: RT reports pt tolerating ATC 24/7 without difficulty.  PMV in use.  SLP reports pt is pending discharge to vent SNF.    VITAL SIGNS:  98.3, 113/62, 109, 100%  PHYSICAL EXAMINATION: General:  Chronically ill appearing male, NAD Neuro:  Awake, interactive, moves all ext. appropriate HEENT:  MM dry, no JVD, trach & xlt unremarkable, trach w/ PMV in place, strong voice Cardiovascular:   [id number] Lungs:  resps even non labored w/ scattered rhonchi  Abdomen:  Round, soft, RLQ ostomy with drainage, midline incision dressing c/d Musculoskeletal:  Warm and dry, 2+ BLE edema, scrotal edema   CMP Latest Ref Rng 08/17/2015 08/16/2015 08/07/2015  Glucose 65 - 99 mg/dL 657(Q) 469(G) 295(M)  BUN 6 - 20 mg/dL <8(U) 6 11  Creatinine 0.61 - 1.24 mg/dL 1.32(G) 4.01(U) 2.72(Z)  Sodium 135 - 145 mmol/L 134(L) 124(L) 131(L)  Potassium 3.5 - 5.1 mmol/L 3.5 3.3(L) 3.4(L)  Chloride 101 - 111 mmol/L 103 94(L) 102  CO2 22 - 32 mmol/L 27 24 25   Calcium 8.9 - 10.3 mg/dL 7.6(L) 7.3(L) 7.6(L)  Total Protein 6.5 - 8.1 g/dL - - -  Total Bilirubin 0.3 - 1.2 mg/dL - - -  Alkaline Phos 38 - 126 U/L - - -  AST 15 - 41 U/L - - -  ALT 17 - 63 U/L - - -    CBC Latest Ref Rng 08/16/2015 08/07/2015 08/06/2015  WBC 4.0 - 10.5 K/uL 9.2 8.7 7.2  Hemoglobin 13.0 - 17.0 g/dL 7.5(L) 7.4(L) 7.1(L)  Hematocrit 39.0 - 52.0 % 25.6(L) 24.0(L) 23.0(L)  Platelets 150 - 400 K/uL 387 418(H) 399    Dg Chest Port 1 View  08/17/2015   CLINICAL DATA:  Repositioning of right upper extremity PICC at bedside.  EXAM: PORTABLE CHEST 1 VIEW  COMPARISON:  Portable chest x-ray earlier same date 1525 hr and earlier.  FINDINGS: Right arm PICC tip now appropriately positioned overlying the mid SVC. Tracheostomy  tube tip remains in satisfactory position below the thoracic inlet.  Prior sternotomy CABG. Cardiac silhouette moderately enlarged, unchanged. Mild pulmonary venous hypertension without overt edema. Airspace consolidation in the left lower lobe and to a lesser degree the lingula, unchanged since yesterday. No new pulmonary parenchymal abnormality.  IMPRESSION: 1. Right arm PICC tip now appropriately positioned overlying the mid SVC. 2. Stable atelectasis and/or pneumonia involving the left lower lobe and lingula.   Electronically Signed   By: Hulan Saas M.D.   On: 08/17/2015 17:09   Dg Chest Port 1 View  08/17/2015   CLINICAL  DATA:  Malpositioned PICC line  EXAM: PORTABLE CHEST 1 VIEW  COMPARISON:  08/16/2015.  FINDINGS: right PICC line tip still extends into the right aspect of the neck into the jugular vein. No change in position. Tracheostomy in the mid trachea. Coronary bypass changes noted. Low lung volumes persist. Cardiomegaly noted with vascular congestion and basilar atelectasis. Small left effusion suspected. No pneumothorax  IMPRESSION: Right PICC line tip extends into the right jugular vein. No change compared to 08/16/2015.  Otherwise stable portable chest exam.   Electronically Signed   By: Judie Petit.  Shick M.D.   On: 08/17/2015 15:34   Dg Chest Port 1 View  08/16/2015   CLINICAL DATA:  Respiratory failure  EXAM: PORTABLE CHEST - 1 VIEW  COMPARISON:  08/07/2015  FINDINGS: The right arm PICC line has flipped up into the IJ vein. Tracheostomy is stable. Feeding tube has been removed. Changes of CABG as before. Mild cardiomegaly. Low lung volumes. Bibasilar atelectasis/consolidation, left greater than right. Suspect layering effusions.  IMPRESSION: 1. Malposition of PICC line into the right IJ vein. 2. Persistent bibasilar atelectasis/infiltrates and probable layering effusions, left worse than right.   Electronically Signed   By: Corlis Leak M.D.   On: 08/16/2015 08:36    ASSESSMENT / PLAN:  Trach/ventilator dependence -s/p prolonged critical illness in setting of abd abscess s/p S/p total colectomy with ileostomy for ischemic colitis (06/18/15), and septic shock- trach re-do 9/8.  Discussion:  Tolerating ATC 24/7, PMV during the day without difficulty.  Pending discharge to trach SNF Plan: Cont ATC w/ PMV Will need cuffless trach prior to transfer to SNF Trach care per protocol    Abdominal abscess - enterococcal (amp sensitive), s/p 2 new drains in abd abscess on 9/28 Plan: Defer ABX to primary SVC Continue current abscess drain management per interventional radiology  LLE DVT Plan: Per primary service    Ischemic cardiomyopathy - hx CABG on 7/16 Plan: Per primary team  Hypokalemia Severe Protein Calorie Malnutrition  Breakdown / necrosis rectum Acute Metabolic encephalopathy Pain, Anxiety. Generalized deconditioning. Plan: Per select  Ongoing rehab efforts  Surgery following   PCCM will sign off.  Please call back if new needs arise.    Canary Brim, NP-C [facility] Pgr: [phone] or if no answer [phone] 08/18/2015, 11:09 AM  Attending Note:  Above note edited.  I reviewed CXR myself, tracheostomy in place, bibasilar atelectasis noted.  Discussed with PCCM-NP and SSH-MD.  69 year old male intubated for inability to protect airway then eventually trached with prolonged illness. On exam, debilitated, older than stated age male with trach with no leak. Very deconditioned.  PULMONARY ETT 8/4 >>8/8 ETT 8/16 >> 8/19 Trach 8/19 (Dr Pollyann Kennedy) >> self decannulated exact date unknown. ETT 9/5>>> A:  VDRF - reintubated 9/5 for aspiration and inability to protect airway. OSA. Aspiration PNA. P:  Continue ATC with PMV  Do not downsize trach until improvement in physical conditioning No decannulation. Recommend trach/SNF. Titrate O2 for sat of 88-92%.  INFECTIOUS A:  Septic shock - aspiration PNA + intraabdominal process (peritonitis vs abdominal abscess). R LE ulcer, chronic. P: Continue Vanc (started 9/6) Aningulofungin 9/7 plan for 7 days total complete Continue current abscess drain management per interventional radiology Consider Repeat CT abd when drainage decreased (per guidance of IR) L IJ CVL initiated 9/6, consider d/c within 14 days   DVT: - Heparin.  Hypokalemia: - Replace and recheck as ordered.  PCCM will sign off, please call back if needed.  Patient seen and examined, agree with above note. I dictated the care and orders written for this patient under my direction.  Alyson Reedy, MD [phone]

## 2015-08-19 LAB — PROTIME-INR
INR: 3.59 — ABNORMAL HIGH (ref 0.00–1.49)
Prothrombin Time: 35 seconds — ABNORMAL HIGH (ref 11.6–15.2)

## 2015-08-20 LAB — CBC
HCT: 29.3 % — ABNORMAL LOW (ref 39.0–52.0)
Hemoglobin: 8.8 g/dL — ABNORMAL LOW (ref 13.0–17.0)
MCH: 27.3 pg (ref 26.0–34.0)
MCHC: 30 g/dL (ref 30.0–36.0)
MCV: 91 fL (ref 78.0–100.0)
PLATELETS: 328 10*3/uL (ref 150–400)
RBC: 3.22 MIL/uL — AB (ref 4.22–5.81)
RDW: 18 % — AB (ref 11.5–15.5)
WBC: 5.2 10*3/uL (ref 4.0–10.5)

## 2015-08-20 LAB — PROTIME-INR
INR: 2.55 — AB (ref 0.00–1.49)
Prothrombin Time: 27.1 seconds — ABNORMAL HIGH (ref 11.6–15.2)

## 2015-08-20 LAB — BASIC METABOLIC PANEL
ANION GAP: 6 (ref 5–15)
BUN: 7 mg/dL (ref 6–20)
CALCIUM: 8.1 mg/dL — AB (ref 8.9–10.3)
CO2: 29 mmol/L (ref 22–32)
Chloride: 100 mmol/L — ABNORMAL LOW (ref 101–111)
Creatinine, Ser: 0.32 mg/dL — ABNORMAL LOW (ref 0.61–1.24)
Glucose, Bld: 97 mg/dL (ref 65–99)
POTASSIUM: 3.4 mmol/L — AB (ref 3.5–5.1)
SODIUM: 135 mmol/L (ref 135–145)

## 2015-08-21 ENCOUNTER — Other Ambulatory Visit (HOSPITAL_COMMUNITY): Payer: Self-pay

## 2015-08-21 LAB — PROTIME-INR
INR: 2.15 — ABNORMAL HIGH (ref 0.00–1.49)
Prothrombin Time: 23.8 seconds — ABNORMAL HIGH (ref 11.6–15.2)

## 2015-08-22 LAB — PROTIME-INR
INR: 2.03 — AB (ref 0.00–1.49)
Prothrombin Time: 22.8 seconds — ABNORMAL HIGH (ref 11.6–15.2)

## 2015-08-23 ENCOUNTER — Other Ambulatory Visit (HOSPITAL_COMMUNITY): Payer: Medicare Other

## 2015-08-23 LAB — PROTIME-INR
INR: 2.16 — AB (ref 0.00–1.49)
Prothrombin Time: 23.9 seconds — ABNORMAL HIGH (ref 11.6–15.2)

## 2015-08-24 LAB — PROTIME-INR
INR: 2.12 — AB (ref 0.00–1.49)
Prothrombin Time: 23.5 seconds — ABNORMAL HIGH (ref 11.6–15.2)

## 2015-08-25 LAB — PROTIME-INR
INR: 2.16 — ABNORMAL HIGH (ref 0.00–1.49)
Prothrombin Time: 23.9 seconds — ABNORMAL HIGH (ref 11.6–15.2)

## 2015-08-26 ENCOUNTER — Encounter: Payer: Self-pay | Admitting: *Deleted

## 2015-08-26 LAB — BASIC METABOLIC PANEL
ANION GAP: 6 (ref 5–15)
ANION GAP: 6 (ref 5–15)
BUN: 6 mg/dL (ref 6–20)
BUN: 7 mg/dL (ref 6–20)
CALCIUM: 7.8 mg/dL — AB (ref 8.9–10.3)
CALCIUM: 7.8 mg/dL — AB (ref 8.9–10.3)
CHLORIDE: 97 mmol/L — AB (ref 101–111)
CO2: 34 mmol/L — AB (ref 22–32)
CO2: 32 mmol/L (ref 22–32)
Chloride: 97 mmol/L — ABNORMAL LOW (ref 101–111)
Creatinine, Ser: 0.33 mg/dL — ABNORMAL LOW (ref 0.61–1.24)
Creatinine, Ser: 0.34 mg/dL — ABNORMAL LOW (ref 0.61–1.24)
GFR calc non Af Amer: >60 mL/min (ref >60)
GFR calc non Af Amer: >60 mL/min (ref >60)
GLUCOSE: 82 mg/dL (ref 65–99)
GLUCOSE: 130 mg/dL — AB (ref 65–99)
POTASSIUM: 2.5 mmol/L — AB (ref 3.5–5.1)
POTASSIUM: 2.8 mmol/L — AB (ref 3.5–5.1)
Sodium: 137 mmol/L (ref 135–145)
Sodium: 135 mmol/L (ref 135–145)

## 2015-08-26 LAB — PROTIME-INR
INR: 2.43 — ABNORMAL HIGH (ref 0.00–1.49)
Prothrombin Time: 26.1 seconds — ABNORMAL HIGH (ref 11.6–15.2)

## 2015-08-27 ENCOUNTER — Inpatient Hospital Stay (HOSPITAL_COMMUNITY)
Admission: AD | Admit: 2015-08-27 | Discharge: 2015-09-25 | DRG: 073 | Disposition: A | Discharge status: Skilled Nursing Facility | Payer: Medicare Other | Source: Intra-hospital | Attending: Physical Medicine & Rehabilitation | Admitting: Physical Medicine & Rehabilitation

## 2015-08-27 DIAGNOSIS — I5030 Unspecified diastolic (congestive) heart failure: Secondary | ICD-10-CM | POA: Diagnosis present

## 2015-08-27 DIAGNOSIS — L89153 Pressure ulcer of sacral region, stage 3: Secondary | ICD-10-CM | POA: Diagnosis not present

## 2015-08-27 DIAGNOSIS — D638 Anemia in other chronic diseases classified elsewhere: Secondary | ICD-10-CM | POA: Diagnosis present

## 2015-08-27 DIAGNOSIS — F419 Anxiety disorder, unspecified: Secondary | ICD-10-CM | POA: Diagnosis present

## 2015-08-27 DIAGNOSIS — L89154 Pressure ulcer of sacral region, stage 4: Secondary | ICD-10-CM | POA: Diagnosis present

## 2015-08-27 DIAGNOSIS — R339 Retention of urine, unspecified: Secondary | ICD-10-CM | POA: Diagnosis not present

## 2015-08-27 DIAGNOSIS — Z933 Colostomy status: Secondary | ICD-10-CM | POA: Diagnosis not present

## 2015-08-27 DIAGNOSIS — I110 Hypertensive heart disease with heart failure: Secondary | ICD-10-CM | POA: Diagnosis present

## 2015-08-27 DIAGNOSIS — G6281 Critical illness polyneuropathy: Principal | ICD-10-CM | POA: Diagnosis present

## 2015-08-27 DIAGNOSIS — I5032 Chronic diastolic (congestive) heart failure: Secondary | ICD-10-CM | POA: Diagnosis present

## 2015-08-27 DIAGNOSIS — E46 Unspecified protein-calorie malnutrition: Secondary | ICD-10-CM | POA: Diagnosis not present

## 2015-08-27 DIAGNOSIS — K651 Peritoneal abscess: Secondary | ICD-10-CM

## 2015-08-27 DIAGNOSIS — B952 Enterococcus as the cause of diseases classified elsewhere: Secondary | ICD-10-CM | POA: Diagnosis present

## 2015-08-27 DIAGNOSIS — G7281 Critical illness myopathy: Secondary | ICD-10-CM | POA: Diagnosis present

## 2015-08-27 DIAGNOSIS — E876 Hypokalemia: Secondary | ICD-10-CM | POA: Diagnosis present

## 2015-08-27 DIAGNOSIS — L89159 Pressure ulcer of sacral region, unspecified stage: Secondary | ICD-10-CM | POA: Diagnosis present

## 2015-08-27 DIAGNOSIS — Z930 Tracheostomy status: Secondary | ICD-10-CM | POA: Diagnosis not present

## 2015-08-27 DIAGNOSIS — L0291 Cutaneous abscess, unspecified: Secondary | ICD-10-CM

## 2015-08-27 DIAGNOSIS — M25561 Pain in right knee: Secondary | ICD-10-CM

## 2015-08-27 DIAGNOSIS — T814XXA Infection following a procedure, initial encounter: Secondary | ICD-10-CM | POA: Diagnosis present

## 2015-08-27 DIAGNOSIS — J9610 Chronic respiratory failure, unspecified whether with hypoxia or hypercapnia: Secondary | ICD-10-CM | POA: Diagnosis present

## 2015-08-27 DIAGNOSIS — G4700 Insomnia, unspecified: Secondary | ICD-10-CM | POA: Diagnosis present

## 2015-08-27 DIAGNOSIS — Z430 Encounter for attention to tracheostomy: Secondary | ICD-10-CM | POA: Diagnosis not present

## 2015-08-27 DIAGNOSIS — G4733 Obstructive sleep apnea (adult) (pediatric): Secondary | ICD-10-CM | POA: Diagnosis present

## 2015-08-27 DIAGNOSIS — G8250 Quadriplegia, unspecified: Secondary | ICD-10-CM | POA: Diagnosis present

## 2015-08-27 DIAGNOSIS — I5042 Chronic combined systolic (congestive) and diastolic (congestive) heart failure: Secondary | ICD-10-CM

## 2015-08-27 DIAGNOSIS — Z9889 Other specified postprocedural states: Secondary | ICD-10-CM | POA: Diagnosis not present

## 2015-08-27 DIAGNOSIS — Z993 Dependence on wheelchair: Secondary | ICD-10-CM | POA: Diagnosis not present

## 2015-08-27 DIAGNOSIS — R5381 Other malaise: Secondary | ICD-10-CM | POA: Diagnosis present

## 2015-08-27 DIAGNOSIS — A491 Streptococcal infection, unspecified site: Secondary | ICD-10-CM | POA: Diagnosis present

## 2015-08-27 DIAGNOSIS — R21 Rash and other nonspecific skin eruption: Secondary | ICD-10-CM | POA: Diagnosis not present

## 2015-08-27 DIAGNOSIS — I82402 Acute embolism and thrombosis of unspecified deep veins of left lower extremity: Secondary | ICD-10-CM | POA: Diagnosis present

## 2015-08-27 LAB — BASIC METABOLIC PANEL
Anion gap: 6 (ref 5–15)
BUN: 8 mg/dL (ref 6–20)
CHLORIDE: 103 mmol/L (ref 101–111)
CO2: 32 mmol/L (ref 22–32)
Calcium: 8.1 mg/dL — ABNORMAL LOW (ref 8.9–10.3)
Creatinine, Ser: 0.33 mg/dL — ABNORMAL LOW (ref 0.61–1.24)
GFR calc non Af Amer: >60 mL/min (ref >60)
Glucose, Bld: 128 mg/dL — ABNORMAL HIGH (ref 65–99)
POTASSIUM: 3.2 mmol/L — AB (ref 3.5–5.1)
SODIUM: 141 mmol/L (ref 135–145)

## 2015-08-27 LAB — PROTIME-INR
INR: 2.46 — ABNORMAL HIGH (ref 0.00–1.49)
Prothrombin Time: 26.4 seconds — ABNORMAL HIGH (ref 11.6–15.2)

## 2015-08-27 LAB — GLUCOSE, CAPILLARY: GLUCOSE-CAPILLARY: 114 mg/dL — AB (ref 65–99)

## 2015-08-27 MED ORDER — FUROSEMIDE 40 MG PO TABS: 60.0000 mg | ORAL_TABLET | Freq: Every day | ORAL | Status: DC

## 2015-08-27 MED ORDER — ADULT MULTIVITAMIN W/MINERALS CH
1.0000 | ORAL_TABLET | Freq: Every day | ORAL | Status: DC
  Administered 2015-08-28 – 2015-09-25 (×29): 1 via ORAL
  Filled 2015-08-27 (×32): qty 1

## 2015-08-27 MED ORDER — FAMOTIDINE 20 MG PO TABS
20.0000 mg | ORAL_TABLET | Freq: Two times a day (BID) | ORAL | Status: DC
  Administered 2015-08-28 – 2015-09-25 (×57): 20 mg via ORAL
  Filled 2015-08-27 (×23): qty 1
  Filled 2015-08-27: qty 2
  Filled 2015-08-27 (×35): qty 1

## 2015-08-27 MED ORDER — BISACODYL 10 MG RE SUPP: 10.0000 mg | Freq: Every day | RECTAL | Status: DC | PRN

## 2015-08-27 MED ORDER — TRAZODONE HCL 50 MG PO TABS
25.0000 mg | ORAL_TABLET | Freq: Every evening | ORAL | Status: DC | PRN
  Administered 2015-08-27 – 2015-08-28 (×2): 25 mg via ORAL
  Administered 2015-08-29: 50 mg via ORAL
  Filled 2015-08-27 (×3): qty 1

## 2015-08-27 MED ORDER — INSULIN ASPART 100 UNIT/ML ~~LOC~~ SOLN: 0.0000 [IU] | Freq: Every day | SUBCUTANEOUS | Status: DC

## 2015-08-27 MED ORDER — ENSURE ENLIVE PO LIQD: 237.0000 mL | Freq: Two times a day (BID) | ORAL | Status: DC

## 2015-08-27 MED ORDER — IPRATROPIUM-ALBUTEROL 0.5-2.5 (3) MG/3ML IN SOLN
3.0000 mL | Freq: Four times a day (QID) | RESPIRATORY_TRACT | Status: DC
  Administered 2015-08-27 – 2015-08-28 (×3): 3 mL via RESPIRATORY_TRACT
  Filled 2015-08-27 (×5): qty 3

## 2015-08-27 MED ORDER — SODIUM CHLORIDE 0.9 % IV SOLN
1.0000 g | Freq: Three times a day (TID) | INTRAVENOUS | Status: DC
  Administered 2015-08-28 – 2015-09-01 (×14): 1 g via INTRAVENOUS
  Filled 2015-08-27 (×21): qty 1

## 2015-08-27 MED ORDER — ALPRAZOLAM 0.25 MG PO TABS
0.2500 mg | ORAL_TABLET | Freq: Two times a day (BID) | ORAL | Status: DC | PRN
  Administered 2015-08-28: 0.25 mg via ORAL
  Filled 2015-08-27: qty 1

## 2015-08-27 MED ORDER — INSULIN ASPART 100 UNIT/ML ~~LOC~~ SOLN
0.0000 [IU] | Freq: Three times a day (TID) | SUBCUTANEOUS | Status: DC
  Administered 2015-08-29 – 2015-09-01 (×2): 1 [IU] via SUBCUTANEOUS

## 2015-08-27 MED ORDER — PRO-STAT SUGAR FREE PO LIQD
30.0000 mL | Freq: Three times a day (TID) | ORAL | Status: DC
  Administered 2015-08-27 – 2015-09-25 (×70): 30 mL via ORAL
  Filled 2015-08-27 (×78): qty 30

## 2015-08-27 MED ORDER — WARFARIN - PHARMACIST DOSING INPATIENT
Freq: Every day | Status: DC
  Administered 2015-09-06 – 2015-09-20 (×3)

## 2015-08-27 MED ORDER — WARFARIN SODIUM 2.5 MG PO TABS
2.5000 mg | ORAL_TABLET | Freq: Every day | ORAL | Status: DC
  Administered 2015-08-27 – 2015-08-30 (×4): 2.5 mg via ORAL
  Filled 2015-08-27 (×4): qty 1

## 2015-08-27 MED ORDER — GUAIFENESIN-DM 100-10 MG/5ML PO SYRP
5.0000 mL | ORAL_SOLUTION | Freq: Four times a day (QID) | ORAL | Status: DC | PRN
  Administered 2015-09-02 – 2015-09-05 (×2): 10 mL via ORAL
  Filled 2015-08-27 (×2): qty 10

## 2015-08-27 MED ORDER — ONDANSETRON HCL 4 MG/2ML IJ SOLN: 4.0000 mg | Freq: Four times a day (QID) | INTRAMUSCULAR | Status: DC | PRN

## 2015-08-27 MED ORDER — FLEET ENEMA 7-19 GM/118ML RE ENEM: 1.0000 | ENEMA | Freq: Once | RECTAL | Status: DC | PRN

## 2015-08-27 MED ORDER — BIOTENE DRY MOUTH MT LIQD
15.0000 mL | Freq: Three times a day (TID) | OROMUCOSAL | Status: DC
  Administered 2015-08-27 – 2015-09-24 (×55): 15 mL via OROMUCOSAL

## 2015-08-27 MED ORDER — ENSURE ENLIVE PO LIQD
237.0000 mL | Freq: Two times a day (BID) | ORAL | Status: DC
  Administered 2015-08-28 – 2015-09-02 (×9): 237 mL via ORAL

## 2015-08-27 MED ORDER — ONDANSETRON HCL 4 MG PO TABS: 4.0000 mg | ORAL_TABLET | Freq: Four times a day (QID) | ORAL | Status: DC | PRN

## 2015-08-27 MED ORDER — METRONIDAZOLE 500 MG PO TABS
500.0000 mg | ORAL_TABLET | Freq: Three times a day (TID) | ORAL | Status: DC
  Administered 2015-08-27 – 2015-08-28 (×3): 500 mg via ORAL
  Filled 2015-08-27 (×3): qty 1

## 2015-08-27 MED ORDER — FUROSEMIDE 40 MG PO TABS
60.0000 mg | ORAL_TABLET | Freq: Every day | ORAL | Status: DC
  Administered 2015-08-28 – 2015-09-16 (×20): 60 mg via ORAL
  Filled 2015-08-27 (×42): qty 1

## 2015-08-27 MED ORDER — SCOPOLAMINE 1 MG/3DAYS TD PT72
1.0000 | MEDICATED_PATCH | TRANSDERMAL | Status: DC
  Administered 2015-08-27: 1.5 mg via TRANSDERMAL
  Filled 2015-08-27: qty 1

## 2015-08-27 MED ORDER — POTASSIUM CHLORIDE CRYS ER 20 MEQ PO TBCR
20.0000 meq | EXTENDED_RELEASE_TABLET | Freq: Three times a day (TID) | ORAL | Status: DC
  Administered 2015-08-28 – 2015-09-16 (×57): 20 meq via ORAL
  Filled 2015-08-27 (×32): qty 1
  Filled 2015-08-27: qty 2
  Filled 2015-08-27 (×23): qty 1

## 2015-08-27 MED ORDER — OXYCODONE HCL 5 MG PO TABS
5.0000 mg | ORAL_TABLET | Freq: Four times a day (QID) | ORAL | Status: DC | PRN
  Administered 2015-08-28 – 2015-09-01 (×7): 5 mg via ORAL
  Filled 2015-08-27 (×8): qty 1

## 2015-08-27 MED ORDER — VITAMIN C 500 MG PO TABS
500.0000 mg | ORAL_TABLET | Freq: Two times a day (BID) | ORAL | Status: DC
  Administered 2015-08-27 – 2015-09-25 (×58): 500 mg via ORAL
  Filled 2015-08-27 (×59): qty 1

## 2015-08-27 MED ORDER — ALUM & MAG HYDROXIDE-SIMETH 200-200-20 MG/5ML PO SUSP: 30.0000 mL | ORAL | Status: DC | PRN

## 2015-08-27 MED ORDER — DIPHENHYDRAMINE HCL 12.5 MG/5ML PO ELIX
12.5000 mg | ORAL_SOLUTION | Freq: Four times a day (QID) | ORAL | Status: DC | PRN
  Administered 2015-09-05 – 2015-09-06 (×3): 25 mg via ORAL
  Filled 2015-08-27 (×3): qty 10

## 2015-08-27 NOTE — H&P (Signed)
Physical Medicine and Rehabilitation Admission H&P   CC: Deconditioning due to prolonged hospitalization secondary to sepsis.    HPI: Jonathan Le is a 69 year old with history of HTN, OSA, morbid obesity, anxiety disorder, CAD (s/p CABG 05/22/2015 with post of A fib and RLE cellulitis) who was originally admitted on 06/19/15 with severe abdominal pain with respiratory failure and sepsis due to ischemic bowel. He required total colectomy with ileostomy by Dr. Barry Dienes and self extubated on 08/08 but developed respiratory distress with confusion requiring eventual intubation. Trach placed on 08/16 by Dr. Constance Holster and patient extubated to [facility] and discharged to [facility] on 08/24. Patient self de-cannulated at some point and had aspiration event with respiratory failure requiring transfer back to acute hospital on 07/20/15. He developed septic shock and was found to have bilateral intra-abdominal abscess and drains placed in left hemipelvis and RLE on 09/06 by Dr. Barbie Banner. He developed respiratory failure requiring re-intubation and trach 09/08. Culture from left drain positive for enterococcus and antibiotics changed to Vancomycin and antidulafungin for coverage. He has had drainage from rectal stump likely due to stump breakdown with peritoneal fluid leakage per CCS. 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   He was transferred back to [facility] on 07/25/15 for vent wean and has been extubated to ATC. He was downsized to XLT cuffed #6 with recommendations not to downsize trach until improvement he has made significant rehab progress" on 09/22 and is tolerating PMSV. Follow up CT abdomen 9/28 revealed enlarging large abscess 1-2 abscesses along left lateral wall. Aspiration drainage performed with placement of 2 new drains by Dr. Anselm Pancoast. Metabolic encephalopathy has resolved. He has had significant edema BLE and developed fevers and BLE dopplers 9/21 was  positive for Left popliteal DVT and therefore coumadin added for treatment. Anasarca treated with IV diuretics with improvement and patient currently on po lasix with 1400 cc FR. Hypokalemia has improved with potassium and magnesium supplementation. Abdominal incision closing in well and multiple ulcers--bilateral heels, left thigh, and trach sites clean and dry. Stage III Sacrococcygeal wound -debrided at bedside on 10/05 and currently reported to be 15 X 15 with 1.8 cm deep .   He has been advanced to regular diet but intake is 30-50% due to dysgeusia. Activity tolerance has improved and he is making progress in therapy. He has met maximum benefit from [facility] and CIR was recommended by MD and Rehab team for follow up therapy. Patient was cleared for admission today.     Review of Systems  Constitutional: Negative for fever.  HENT: Negative for hearing loss.  Eyes: Negative for blurred vision and double vision.   Off glaucoma medication due to Dr. Lucianne Lei Tright  Respiratory: Negative for cough, shortness of breath and wheezing.  Cardiovascular: Positive for leg swelling (BLE edema since CABG). Negative for chest pain and palpitations.  Gastrointestinal: Negative for heartburn, nausea, vomiting and abdominal pain.  Genitourinary: Negative for dysuria and flank pain.   History of hesitancy.  Musculoskeletal: Positive for myalgias, back pain and joint pain (Bad right knee--has been aspirated by Dr. Marlou Sa in the past).  Neurological: Positive for sensory change (chronic numbness B-feet) and weakness. Negative for speech change, focal weakness and headaches.  Psychiatric/Behavioral: Negative for depression. The patient has insomnia (Has day-night disruption since admission).      Past Medical History  Diagnosis Date  . Anxiety   . Hypertension   . Arthritis   .  Sleep apnea     wears CPAP nightly  . Glaucoma   . CHF (congestive heart failure)      MI, with open heart surgery x 30 sdays ago per pt. (06/26/15)  . S/P CABG (coronary artery bypass graft)     Past Surgical History  Procedure Laterality Date  . Hernia repair      right  . Eye surgery      bil cataract  . Wisdom tooth extraction    . Fasciotomy foot / toe Left     foot  . Cardiac catheterization N/A 05/20/2015    Procedure: Left Heart Cath and Coronary Angiography; Surgeon: Burnell Blanks, MD; Location: [place] [facility]; Service: Cardiovascular; Laterality: N/A;  . Coronary artery bypass graft N/A 05/22/2015    Procedure: CORONARY ARTERY BYPASS GRAFTING (CABG), ON PUMP, TIMES THREE, USING LEFT INTERNAL MAMMARY ARTERY, RIGHT GREATER SAPHENOUS VEIN HARVESTED ENDOSCOPICALLY; Surgeon: Ivin Poot, MD; Location: [place]; Service: Open Heart Surgery; Laterality: N/A; LIMA-LAD, SVG-OM, SVG-PD  . Tee without cardioversion N/A 05/22/2015    Procedure: TRANSESOPHAGEAL ECHOCARDIOGRAM (TEE); Surgeon: Ivin Poot, MD; Location: [place]; Service: Open Heart Surgery; Laterality: N/A;  . Laparotomy N/A 06/18/2015    Procedure: EXPLORATORY LAPAROTOMY; Surgeon: Rolm Bookbinder, MD; Location: [place]; Service: General; Laterality: N/A;  . Laparotomy N/A 06/20/2015    Procedure: WASHOUT OF ABDOMEN, CLOSURE OF ABDOMINAL WALL WITH PLACEMENT OF WOUND VAC AND ILLEOSTOMY ; Surgeon: Stark Klein, MD; Location: [place]; Service: General; Laterality: N/A;  . Tracheostomy tube placement N/A 07/03/2015    Procedure: TRACHEOSTOMY; Surgeon: Izora Gala, MD; Location: [place]; Service: ENT; Laterality: N/A;    No family history on file.    Social History: Married. 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 He reports that he has never smoked. He has never used smokeless tobacco. He reports that he does not drink alcohol or use illicit drugs.    Allergies  Allergen Reactions  .  Levaquin [Levofloxacin] Nausea Only  . Penicillins Hives  . Doxycycline Rash    Medications Prior to Admission  Medication Sig Dispense Refill  . acetaminophen (TYLENOL) 160 MG/5ML solution Take 20.3 mLs (650 mg total) by mouth every 6 (six) hours as needed for fever. 120 mL 0  . Amino Acids-Protein Hydrolys (FEEDING SUPPLEMENT, PRO-STAT SUGAR FREE 64,) LIQD Place 30 mLs into feeding tube 5 (five) times daily. 900 mL 0  . anidulafungin 100 mg in sodium chloride 0.9 % 100 mL Inject 100 mg into the vein daily.    . chlorhexidine gluconate (PERIDEX) 0.12 % solution 15 mLs by Mouth Rinse route 2 (two) times daily. 120 mL 0  . dextrose 5 % solution Inject 10 mLs into the vein continuous. 50 mL   . insulin aspart (NOVOLOG) 100 UNIT/ML injection CBG < 70: implement hypoglycemia protocol CBG 70 - 120: 0 units CBG 121 - 150: 2 units CBG 151 - 200: 3 units CBG 201 - 250: 5 units CBG 251 - 300: 8 units CBG 301 - 350: 11 units CBG 351 - 400: 15 units CBG > 400: call MD and obtain STAT lab verification 10 mL 11  . norepinephrine 16 mg in dextrose 5 % 250 mL Inject 2-50 mcg/min into the vein continuous.    . Nutritional Supplements (FEEDING SUPPLEMENT, VITAL HIGH PROTEIN,) LIQD liquid 50 ml/hr    . pantoprazole sodium (PROTONIX) 40 mg/20 mL PACK Place 20 mLs (40 mg total) into feeding tube daily. 30 each   .  vancomycin (VANCOCIN) 1 GM/200ML SOLN Inject 200 mLs (1,000 mg total) into the vein every 12 (twelve) hours. 4000 mL   . white petrolatum (VASELINE) GEL Apply 1 application topically daily. Apply to L nare wound daily  0    Home: 2 Level home with 2 STE. Does have BR on main level.   Functional History: Independent PTA.  Has been sedentary due to knee and back problems.   Functional Status:  Mobility: Min to mod assist + 2 for bed mobility and transfer to EOB. Able to sit EOB for 30 minutes.   ADL: Max assist  to total assist with ADLs.   Cognition: Alert and oriented X 3 Able to follow one and two step commands without difficulty.    Blood pressure 117/78, pulse 115, resp. rate 23, SpO2 96 %. Physical Exam  Nursing note and vitals reviewed. Constitutional: He is oriented to person, place, and time. He appears well-developed and well-nourished. No distress.  HENT:  Head: Normocephalic and atraumatic.  Mouth/Throat: Oropharynx is clear and moist.  Eyes: Conjunctivae and EOM are normal. Pupils are equal, round, and reactive to light.  Neck: Decreased range of motion present.  Trach site with dry dressing and ATC in place.  Cardiovascular: Normal rate and regular rhythm. Exam reveals no friction rub.  Respiratory: Effort normal and breath sounds normal. No respiratory distress. He has no wheezes. He exhibits no tenderness.  Sternotomy incision is clean,dry and intact--healing well.  GI: Soft. Bowel sounds are normal.  Midline incision has healed to surface but not epithealized yet. Ostomy with bilious drainage. Multiple drains LLQ sutured in place and mild irritation at entry site.  Genitourinary:  Min scrotal edema noted. Foley in place.  Musculoskeletal: He exhibits edema (LLE with 1+ edema tibially and 2+ pedally. RLE with 1+ edema pedally. ). He exhibits no tenderness.  Right knee with 1-2+ effusion. No tenderness with ROM. Tends to keep RLE rotated outward.  Neurological: He is alert and oriented to person, place, and time.  Able to phonate well with PMSV. Speech clear. Follows basic commands without difficulty.  Skin: Skin is warm and dry. He is not diaphoretic.  Right lower shin with stasis changes. Multiple ulcers on bilateral heels, right foot, left thigh covered with foam and opsite. Grade 3 sacral decubitus tunneling 6 cm at 1:00 Psychiatric: He has a normal mood and affect. His speech is normal and behavior is normal. Cognition and memory are normal.  Motor strength is  3 minus bilateral deltoid 4 minus bicep tricep grip, 2 minus bilateral hip flexors 3 minus knee extensors 3 minus bilateral ankle dorsiflexor plantar flexor Sensation is absent in bilateral feet to light touch. Present at the knees Fingers has some difficulty identifying light touch   Lab Results Last 48 Hours    Results for orders placed or performed during the hospital encounter of 07/25/15 (from the past 48 hour(s))  Protime-INR Status: Abnormal   Collection Time: 08/26/15 9:45 AM  Result Value Ref Range   Prothrombin Time 26.1 (H) 11.6 - 15.2 seconds   INR 2.43 (H) 0.00 - 2.40  Basic metabolic panel Status: Abnormal   Collection Time: 08/26/15 9:45 AM  Result Value Ref Range   Sodium 137 135 - 145 mmol/L   Potassium 2.5 (LL) 3.5 - 5.1 mmol/L    Comment: CRITICAL RESULT CALLED TO, READ BACK BY AND VERIFIED WITH: D. TAYLOR,RN AT 1023 08/26/15 BY ZBEECH.    Chloride 97 (L) 101 - 111 mmol/L  CO2 34 (H) 22 - 32 mmol/L   Glucose, Bld 82 65 - 99 mg/dL   BUN 6 6 - 20 mg/dL   Creatinine, Ser 0.33 (L) 0.61 - 1.24 mg/dL   Calcium 7.8 (L) 8.9 - 10.3 mg/dL   GFR calc non Af Amer >60 >60 mL/min   GFR calc Af Amer >60 >60 mL/min    Comment: (NOTE) The eGFR has been calculated using the CKD EPI equation. This calculation has not been validated in all clinical situations. eGFR's persistently <60 mL/min signify possible Chronic Kidney Disease.    Anion gap 6 5 - 15  Basic metabolic panel Status: Abnormal   Collection Time: 08/26/15 12:02 PM  Result Value Ref Range   Sodium 135 135 - 145 mmol/L   Potassium 2.8 (L) 3.5 - 5.1 mmol/L   Chloride 97 (L) 101 - 111 mmol/L   CO2 32 22 - 32 mmol/L   Glucose, Bld 130 (H) 65 - 99 mg/dL   BUN 7 6 - 20 mg/dL   Creatinine, Ser 0.34 (L) 0.61 - 1.24 mg/dL   Calcium 7.8 (L) 8.9 - 10.3 mg/dL   GFR calc non Af Amer >60 >60  mL/min   GFR calc Af Amer >60 >60 mL/min    Comment: (NOTE) The eGFR has been calculated using the CKD EPI equation. This calculation has not been validated in all clinical situations. eGFR's persistently <60 mL/min signify possible Chronic Kidney Disease.    Anion gap 6 5 - 15  Protime-INR Status: Abnormal   Collection Time: 08/27/15 6:20 AM  Result Value Ref Range   Prothrombin Time 26.4 (H) 11.6 - 15.2 seconds   INR 2.46 (H) 0.00 - 1.49      Imaging Results (Last 48 hours)    No results found.       Medical Problem List and Plan: 1. 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  2. LLE DVT treatment /Anticoagulation: Pharmaceutical: Coumadin 3. Pain Management: Tylenol for prn for pain. Will continue oxycodone prn if needed for sever pain. 4. Mood: LCSW to follow for evaluation and support.  5. Neuropsych: This patient is capable of making decisions on his own behalf. 6. Skin/Wound Care: Will consult WOC for input on sacral wound as well as multiple healing ulcers. Will add Vit C and Zinc to help promote healing.  7. Fluids/Electrolytes/Nutrition: Monitor I/O. Will consult dietician to help with food choices. Increase prosource to TID. Added ensure between meals as unable to tolerate a lot of food at one time.  8. Abdominal abscess: Continue drain care bid. On Daptomycin --question duration. Flagyl to contiue till 10/26 per records.  9. Hypokalemia: Improved with supplement in past 24 hours. Will add daily supplement as on diuretics. Recheck Magnesium levels.  10. VDRF/ History of OSA: To continue current trach till mobility improves per CCM. Tolerating humidified air.  11. Anxiety disorder: Will resume low dose Xanax as used this for insomnia at home.  12. Anemia: Likely due to infection/chronic illness.  Check CBC in am.  13. Anasarca: Improved with diuresis. Continue Lasix.  14. Diastolic CHF: Off amiodarone, Ace and statin at this time due to recurrent hypotension and multiple medical issues. Monitor for signs of overload. Check daily weights. Monitor BP bid.     Post Admission Physician Evaluation: 1. 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 2. Patient is admitted to receive collaborative, interdisciplinary care between the physiatrist, rehab nursing staff, and therapy team. 3. Patient's level of medical complexity and substantial therapy needs in context of that medical necessity cannot be provided at a lesser intensity of care such as a SNF. 4. Patient has experienced substantial functional loss from his/her baseline which was documented above under the "Functional History" and "Functional Status" headings. Judging by the patient's diagnosis, physical exam, and functional history, the patient has potential for functional progress which will result in measurable gains while on inpatient rehab. These gains will be of substantial and practical use upon discharge in facilitating mobility and self-care at the household level. 5. Physiatrist will provide 24 hour management of medical needs as well as oversight of the therapy plan/treatment and provide guidance as appropriate regarding the interaction of the two. 6. 24 hour rehab nursing will assist with bladder management, safety, skin/wound care, disease management, medication administration, pain management, patient education and Colostomy management and help integrate therapy concepts, techniques,education, etc. 7. PT will assess and treat for/with: pre gait, gait training, endurance , safety, equipment, neuromuscular re education. Goals are: Min A. 8. OT will assess and treat for/with: ADLs, Cognitive perceptual  skills, Neuromuscular re education, safety, endurance, equipment. Goals are: minA. Therapy may proceed with showering this patient. 9. SLP will assess and treat for/with: Memory and attention concentration. Goals are: Modified independent with mat management. 10. Case Management and Social Worker will assess and treat for psychological issues and discharge planning. 11. Team conference will be held weekly to assess progress toward goals and to determine barriers to discharge. 12. Patient will receive at least 3 hours of therapy per day at least 5 days per week. 13. ELOS: 25-28 days  14. Prognosis: fair     [name] M.D. [facility] FAAPM&R (Sports Med, Neuromuscular Med) Diplomate Am Board of Electrodiagnostic Med  08/27/2015

## 2015-08-27 NOTE — Progress Notes (Signed)
Pt arrived to [facility] from [facility], accompanied by sister.  Alert, oriented x 3, anxiety evident.  Ileostomy leaking, supplies obtained and restored. Wet to dry dressing to buttocks/sacral area ST. 3 ( see FS).Butterfly foam to bil. heels, both with unstageable pressure wounds, rt. with SDTI.  Foley patent; penis retracted with peeling skin,odor.Scrotum swollen, elevated.   Foley care provided. Groin moist, red, MGP after cleansed. Drains Lt abd. wall patent; nephrostomy tube patent, site reddened.  Abd. incision unapproximated, hydrol gel and dry dressing applied. Shearing wound lt posterior thigh, foam placed. Dr. Wynn BankerKirsteins assessed.

## 2015-08-27 NOTE — PMR Pre-admission (Signed)
[facility] PMR Admission Coordinator Pre-Admission Assessment  Patient: Jonathan Le is an 69 y.o., male MRN: [id number] DOB: 1946/06/16 Height:   Weight:    Insurance Information HMO:     PPO:      PCP:      IPA:      80/20: yes     OTHER: no HMO PRIMARY: Medicare a and b      Policy#: [id number]      Subscriber: pt Benefits:  Phone #: Passport One     Name: 08/25/2015 Eff. Date: 08/14/2010     Deduct: $1288      Out of Pocket Max: none      Life Max: none CIR: 100%      SNF: 20 full days Outpatient: 80%     Co-Pay: 20% Home Health: 100%      Co-Pay: none DME: 80%     Co-Pay: 20% Providers: pt choice  SECONDARY: AARP supplement      Policy#: [id number]      Subscriber: pt Benefits:  Phone #: [phone]     Name: 10/12 Jonathan Le aarp medicare covers deductible and coinsurance of medicare to include coinsurance for lifetime reserve days As of 10/13, pt day 82 of his hospital days this year Day 1-60, no coinsurance for benefit period; day 61-90 $322 per day coinsurance per day that aarp covers. Days 92 to 150 are his life time reserve days with a coinsurance of $644 per day which aarp covers. After day 150 this benefit period, patient covers all cost.    Medicaid Application Date:       Case Manager:  Disability Application Date:       Case Worker:   Emergency Contact Information Contact Information    Name Relation Home Work Mobile   Jonathan Le Spouse [phone]     Jonathan Le Sister [phone]        Current Medical History  Patient Admitting Diagnosis: Debility after colectomy and recent CABG  History of Present Illness: 69 year old male with PMH of OSA and HTN. 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   Post discharge course complicated by R leg  cellulitis after endoscopic vein harvest he was seen by Dr. Prescott Gum in clinic for this,was having some constipation. 8/4 PM he took an enema and was straining in bathroom. He became diaphoretic and had loss of consciousness. In ED he was noted to be profoundly hypotensive with SOB. Found to be in severe septic shock with presumed ischemic colon. He was then taken to the OR emergently by Dr. Donne Hazel and had abdominal colectomy 8/4. Returned to OR 8/6 for abdominal closure with ileostomy.Post operatively, he self extubated on 8/8. He required re-intubation and on 8/19, he subsequently had tracheostomy performed by Dr. Constance Holster of ENT.On 8/24, he was discharged to [facility].  While on [facility], he self de-cannulated at some point and on 9/5, he had a suspected aspiration event and developed respiratory failure.PCCM was consulted and pt was intubated. Later that night, he had ongoing hypotension despite increasing dose of levophed. Renal US earlier in the day revealed findings concerning for peritonitis vs abscess. Pt was transferred to [facility] ICU and CCS was consulted for further recs 07/21/15.Pt was readmitted to [facility] .  [facility] admission 9/6 until 9/10 for aspiration PNA and  respiratory failure. Pt reintubated for worsening septic shock. Emergent drainage of abcess by IR 07/21/15. Drainage from rectum felt by surgery form rectal stump breakdown with peritoneal fluid leakage.  Cultures positive for enterococcal abcesses. Trach placed 07/23/15. Pt cleared for d/c back to [facility] 07/25/15 on vent and abdominal wound VAC.  At [facility] admission 07/25/15 to discharge 10/13 pt has been weaned from vent. #6 XLT trach using PMV with intermittent supervision. Regular diet with thin liquids. Followed by IR with 3 drains remaining total for abdominal fluid collections. 2 placed 9/6 and 2 placed 9/28 by IR.ON 9/28 between the three drains, 950 ml of cloudy yellow fluid removed. Drains attached to suction bulbs and maintained with routine flushing.  Plan by IR to reCT when output decreased to 10 cc/day in each drain per their assessment 08/25/15. Dr. 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 Sharp debridement performed at bedside on 08/19/15. Consideration for wound VAC once starts healing to be considered. Nutritional status of concern and affressive nutritional supplementation recommended. Pt on Meropenem and Metronidazole IV. Pt felt to have reached maximum benefit form LTACH on 08/27/15 and arrangements made to admit to AIR.  Patient's medical record from [facility] has been reviewed by the rehabilitation admission coordinator and physician. Discussed with Dr. Naaman Plummer on 08/25/15 with Admission Coordinator.  NIH Stroke scale: Glascow Coma Scale:  Past Medical History  Past Medical History  Diagnosis Date  . Anxiety   . Hypertension   . Arthritis   . Sleep apnea     wears CPAP nightly  . Glaucoma   . CHF (congestive heart failure)     MI, with open heart surgery x 30 sdays ago per pt. (06/26/15)  . S/P CABG (coronary artery bypass graft)     Family History   family history is not on file.  Prior Rehab/Hospitalizations Has the patient had major surgery during 100 days prior to admission? Yes  Current Medications Meropenem  Metronidazole  Humalog Famotidine Fluticasone nasal Furosemide Guaifenesin Albuterol wafarin Pro-stat thera m plus  transderm-scop  Patients Current Diet:  Regular diet with thin liquids, Ensure BID, Prostat AWC TID and pudding BID   Precautions / Restrictions     Has the patient had 2 or more falls or a fall with injury in the past year?No  Prior Activity Level   before admitted for CABG 7/28. [employer] which he sold 11/15 to retire. Had been sedentary with stooped posture and bilateral bad knees for about 6 years. Wife states he has gained weight and become more sedentary over the past six  years.  Prior Functional Level Self Care: Did the patient need help bathing, dressing, using the toilet or eating?  Independent  Indoor Mobility: Did the patient need assistance with walking from room to room (with or without device)? Independent  Stairs: Did the patient need assistance with internal or external stairs (with or without device)? Independent  Functional Cognition: Did the patient need help planning regular tasks such as shopping or remembering to take medications? Independent  Home Assistive Devices / Equipment    Prior Device Use: Indicate devices/aids used by the patient prior to current illness, exacerbation or injury? Walker   Prior Functional Level Current Functional Level  Bed Mobility         Transfers         Mobility - Walk/Wheelchair         Upper Body  Dressing         Lower Body Dressing         Grooming         Eating/Drinking         Toilet Transfer         Bladder Continence          Bowel Management         Stair Climbing  Independent      Communication         Memory         Cooking/Meal Prep         Housework       Money Management       Driving  Independent     Special needs/care consideration Oxygen 28% trach collar Special Bed air mattress overlay Trach Size #6 XLT with PMV usuage Skin 08/24/15 Wound progress report by [facility] assessment #1 midline abd incision/surgical 14x1cm pink/beefy with granulation tissue no exudate #2 L abd percutaneous drains Perc sites pink/beefy tissue no exudate #3 r abd percutaneous drain  Perc sites no exudate and pink/beefy tissue #4 sacrococcygeal /b buttocks 15X15 cm depth 1.8 cm no odor, serosanguineous exudate small amount. Pink/beefy red tissue,granulationtissue,slough and black/brown eschar #5 r forearm skin tear #6 tracheostomy anterior neck site excoriation no exudate pink/beefy red tissue #7 l upper thigh blister site  3x2.5 cm no exudate. Pink/beefy  tissue with slough #8 l heel stage II    Blister 3x5 cm no exudate pink/beefy tissue #9 r heel stage II     Blister 4x4 cm no exudate pink/beefy tissue #10 r anterior foot Felt to be stable wound beds without s/s infection. S/p surgical debridement by Dr. Noberto Retort to sacral wound 10/5. Bowel mgmt:ileostomy new admission 8/16. Wife has observed care for twice at [facility] and would like further education Bladder mgmt:foley Diabetic mgmt no CPAP nightly pta  Previous Home Environment   Pt has two adult children and his wife has one adult child. Pt's sister, [name] visits daily.  Discharge Living Setting     Social/Family/Support Systems   Pt's wife, Faythe Dingwall visits daily typically 3 pm until 9pm or later. Pt's sister, [name] and her spouse visit daily 2 to 3 hrs during the day. Pt's wife, Faythe Dingwall and pt are decision makers for his care, not his sister, [name]. Earlier education times and family education with wife are easily arranged by contacted wife directly, not through his sister. Pt and wife request d/c plans and education be arranged directly with [place].  Goals/Additional Needs   Pt and wife are aware that if pt does not progress to level for direct d/c home that further SNF level rehab would be recommended. Pt has never had SNF care, but is hesitant due to the fact that his Mom was at SNF and eventually died with gangrene requiring amputation of LE. She was at [place] which is 2 miles within his home so that would be same SNF that he and his wife would likely request if SNF sought. Pt and wife prefer home d/c if possible with pt at min assist short distances or min assist transfers with wound care and needs could be met with home health support.  Patient Condition: I have reviewed all medial records from [facility]. Patient's condition is appropriate for intensive rehabilitative care in an inpatient rehabilitation facility.  Marland Kitchen He is currently max assist to total assist with all adls. We  will admit pt to inpatient acute rehabilitation on 08/27/2015.  Preadmission  Screen Completed By:  [name], 08/27/2015 10:53 AM ______________________________________________________________________   Discussed status with Dr. Letta Pate on  08/27/15 at 1037 and received telephone approval for admission today.  Admission Coordinator:  [name], time  1900 Date  08/26/2015   Assessment/Plan: Diagnosis:deconditioning after sepsis,respiratory failure 1. Does the need for close, 24 hr/day  Medical supervision in concert with the patient's rehab needs make it unreasonable for this patient to be served in a less intensive setting? Yes 2. Co-Morbidities requiring supervision/potential complications: MI, morbid obesity, OSA, skin care, nutrition 3. Due to bladder management, bowel management, safety, skin/wound care, disease management, medication administration, pain management, patient education and colostomy, does the patient require 24 hr/day rehab nursing? Yes 4. Does the patient require coordinated care of a physician, rehab nurse, PT (1-2 hrs/day, 5 days/week) and OT (12 hrs/day, 5 days/week) to address physical and functional deficits in the context of the above medical diagnosis(es)? Yes Addressing deficits in the following areas: balance, endurance, locomotion, strength, transferring, bowel/bladder control, bathing, dressing, feeding, grooming and toileting 5. Can the patient actively participate in an intensive therapy program of at least 3 hrs of therapy 5 days a week? Yes 6. The potential for patient to make measurable gains while on inpatient rehab is good 7. Anticipated functional outcomes upon discharge from inpatients are: supervision PT, supervision OT, n/a SLP 8. Estimated rehab length of stay to reach the above functional goals is: 14-20 days 9. Does the patient have adequate social supports to accommodate these discharge functional goals?  Potentially 10. Anticipated D/C setting: Home 11. Anticipated post D/C treatments: [place] therapy 12. Overall Rehab/Functional Prognosis: good  RECOMMENDATIONS: This patient's condition is appropriate for continued rehabilitative care in the following setting: CIR Patient has agreed to participate in recommended program. Yes Note that insurance prior authorization may be required for reimbursement for recommended care.  Comment:  [name] 08/27/2015

## 2015-08-27 NOTE — Progress Notes (Signed)
ANTIBIOTIC / Anticoagulation CONSULT NOTE - FOLLOW UP  Pharmacy Consult for Merropenem / Warfarin Indication: Intraabdominal Infection / LLE DVT - new 9/16  Allergies  Allergen Reactions  . Levaquin [Levofloxacin] Nausea Only  . Penicillins Hives  . Doxycycline Rash    Patient Measurements: Height: 5\' 9"  (175.3 cm) Weight: 268 lb 1.3 oz (121.6 kg) IBW/kg (Calculated) : 70.7   Vital Signs: Temp: 98.2 F (36.8 C) (10/13 1600) Temp Source: Oral (10/13 1600) BP: 103/67 mmHg (10/13 1600) Pulse Rate: 99 (10/13 1600) Intake/Output from previous day:   Intake/Output from this shift:    Labs:  Recent Labs  08/26/15 0945 08/26/15 1202 08/27/15 0920  CREATININE 0.33* 0.34* 0.33*   Estimated Creatinine Clearance: 110.7 mL/min (by C-G formula based on Cr of 0.33). No results for input(s): VANCOTROUGH, VANCOPEAK, VANCORANDOM, GENTTROUGH, GENTPEAK, GENTRANDOM, TOBRATROUGH, TOBRAPEAK, TOBRARND, AMIKACINPEAK, AMIKACINTROU, AMIKACIN in the last 72 hours.   Microbiology: Recent Results (from the past 720 hour(s))  Culture, blood (routine x 2)     Status: None   Collection Time: 07/29/15  1:20 PM  Result Value Ref Range Status   Specimen Description BLOOD  Final   Special Requests BOTTLES DRAWN AEROBIC AND ANAEROBIC 10CC LEFT IJ  Final   Culture NO GROWTH 5 DAYS  Final   Report Status 08/03/2015 FINAL  Final  Culture, Urine     Status: None   Collection Time: 07/29/15  1:20 PM  Result Value Ref Range Status   Specimen Description URINE, RANDOM  Final   Special Requests NONE  Final   Culture NO GROWTH 2 DAYS  Final   Report Status 07/31/2015 FINAL  Final  Culture, blood (routine x 2)     Status: None   Collection Time: 07/29/15  2:11 PM  Result Value Ref Range Status   Specimen Description BLOOD LEFT HAND  Final   Special Requests BOTTLES DRAWN AEROBIC AND ANAEROBIC 5CC  Final   Culture NO GROWTH 5 DAYS  Final   Report Status 08/03/2015 FINAL  Final  Culture, respiratory  (NON-Expectorated)     Status: None   Collection Time: 07/29/15  2:30 PM  Result Value Ref Range Status   Specimen Description TRACHEAL ASPIRATE  Final   Special Requests NONE  Final   Gram Stain   Final    FEW WBC PRESENT,BOTH PMN AND MONONUCLEAR FEW SQUAMOUS EPITHELIAL CELLS PRESENT FEW GRAM VARIABLE ROD RARE GRAM POSITIVE COCCI IN PAIRS    Culture   Final    Non-Pathogenic Oropharyngeal-type Flora Isolated. Performed at [facility]    Report Status 08/02/2015 FINAL  Final  Culture, blood (routine x 2)     Status: None   Collection Time: 08/05/15  1:47 PM  Result Value Ref Range Status   Specimen Description BLOOD PICC LINE  Final   Special Requests BOTTLES DRAWN AEROBIC AND ANAEROBIC 10CC  Final   Culture NO GROWTH 5 DAYS  Final   Report Status 08/10/2015 FINAL  Final  Culture, blood (routine x 2)     Status: None   Collection Time: 08/05/15  1:55 PM  Result Value Ref Range Status   Specimen Description BLOOD LEFT HAND  Final   Special Requests BOTTLES DRAWN AEROBIC ONLY 10CC  Final   Culture NO GROWTH 5 DAYS  Final   Report Status 08/10/2015 FINAL  Final  C difficile quick scan w PCR reflex     Status: None   Collection Time: 08/05/15  2:04 PM  Result Value Ref  Range Status   C Diff antigen NEGATIVE NEGATIVE Final   C Diff toxin NEGATIVE NEGATIVE Final   C Diff interpretation Negative for toxigenic C. difficile  Final  Stat Gram stain     Status: None   Collection Time: 08/05/15  4:30 PM  Result Value Ref Range Status   Specimen Description SPUTUM  Final   Special Requests NONE  Final   Gram Stain   Final    RARE WBC PRESENT, PREDOMINANTLY MONONUCLEAR RARE GRAM POSITIVE COCCI IN PAIRS Gram Stain Report Called to,Read Back By and Verified WithArminda Resides RN [id number] 08/05/15 A [facility]    Report Status 08/05/2015 FINAL  Final  Culture, respiratory (NON-Expectorated)     Status: None   Collection Time: 08/05/15  4:30 PM  Result Value Ref Range Status    Specimen Description TRACHEAL ASPIRATE  Final   Special Requests NONE  Final   Gram Stain   Final    MODERATE WBC PRESENT,BOTH PMN AND MONONUCLEAR RARE SQUAMOUS EPITHELIAL CELLS PRESENT RARE GRAM POSITIVE COCCI IN PAIRS Performed at [facility]    Culture   Final    Non-Pathogenic Oropharyngeal-type Flora Isolated. Performed at [facility]    Report Status 08/08/2015 FINAL  Final  Culture, Urine     Status: None   Collection Time: 08/05/15  6:21 PM  Result Value Ref Range Status   Specimen Description URINE, RANDOM  Final   Special Requests NONE  Final   Culture NO GROWTH 2 DAYS  Final   Report Status 08/07/2015 FINAL  Final  Stat Gram stain     Status: None   Collection Time: 08/05/15  6:21 PM  Result Value Ref Range Status   Specimen Description URINE, RANDOM  Final   Special Requests NONE  Final   Gram Stain   Final    WBC PRESENT, PREDOMINANTLY MONONUCLEAR NO ORGANISMS SEEN CYTOSPIN    Report Status 08/05/2015 FINAL  Final    Anti-infectives    Start     Dose/Rate Route Frequency Ordered Stop   08/27/15 2200  meropenem (MERREM) 1 g in sodium chloride 0.9 % 100 mL IVPB     1 g 200 mL/hr over 30 Minutes Intravenous 3 times per day 08/27/15 1658 09/03/15 2359   08/27/15 1615  metroNIDAZOLE (FLAGYL) tablet 500 mg     500 mg Oral 3 times per day 08/27/15 1610 09/10/15 0559      Assessment: 69yom admitted to [facility] with intra-abdominal abscess / sepsis, became VDRF and weaned in [facility] now to [facility] for rehab.   During initial stay developed DVT in LLE treated with enoxaparin bridge to warfarin.  INR at goal 2.46 will continue [facility] dosing of Warfarin 2.5mg  daily.  H/H stable 8.8/29, PLTC stable 300.    Intra-abdominal infection with abscess and drains being followed by IR.  Continue Merropenem 1gm IV q8 until 10/20 and Flagyl  po q8 until 10/26.  WBC WNL, afebrile.    Goal of Therapy:  INR 2-3  Plan:  Merropenem 1gm IV q8 end 10/20 - d/c [facility]  notes Flagyl  po q8 end 10/26 - per d/c [facility] notes Warf 2.5mg  daily  Daily INR  Leota Sauers Pharm.D. CPP, BCPS Clinical Pharmacist [phone] 08/27/2015 5:07 PM        Leota Sauers Pharm.D. CPP, BCPS Clinical Pharmacist [phone] 08/27/2015 5:05 PM

## 2015-08-28 ENCOUNTER — Inpatient Hospital Stay (HOSPITAL_COMMUNITY): Payer: Medicare Other | Admitting: Physical Therapy

## 2015-08-28 ENCOUNTER — Inpatient Hospital Stay (HOSPITAL_COMMUNITY): Payer: Medicare Other | Admitting: Speech Pathology

## 2015-08-28 ENCOUNTER — Inpatient Hospital Stay (HOSPITAL_COMMUNITY): Payer: Medicare Other | Admitting: Occupational Therapy

## 2015-08-28 ENCOUNTER — Encounter (HOSPITAL_COMMUNITY): Payer: Self-pay | Admitting: *Deleted

## 2015-08-28 DIAGNOSIS — Z959 Presence of cardiac and vascular implant and graft, unspecified: Secondary | ICD-10-CM

## 2015-08-28 DIAGNOSIS — Z930 Tracheostomy status: Secondary | ICD-10-CM

## 2015-08-28 DIAGNOSIS — Z932 Ileostomy status: Secondary | ICD-10-CM

## 2015-08-28 DIAGNOSIS — E8809 Other disorders of plasma-protein metabolism, not elsewhere classified: Secondary | ICD-10-CM

## 2015-08-28 DIAGNOSIS — Z933 Colostomy status: Secondary | ICD-10-CM

## 2015-08-28 DIAGNOSIS — K651 Peritoneal abscess: Secondary | ICD-10-CM | POA: Diagnosis present

## 2015-08-28 LAB — CBC WITH DIFFERENTIAL/PLATELET
BASOS ABS: 0.1 10*3/uL (ref 0.0–0.1)
Basophils Relative: 1 %
Eosinophils Absolute: 0.7 10*3/uL (ref 0.0–0.7)
Eosinophils Relative: 11 %
HEMATOCRIT: 26.9 % — AB (ref 39.0–52.0)
Hemoglobin: 8.2 g/dL — ABNORMAL LOW (ref 13.0–17.0)
LYMPHS PCT: 23 %
Lymphs Abs: 1.5 10*3/uL (ref 0.7–4.0)
MCH: 27.4 pg (ref 26.0–34.0)
MCHC: 30.5 g/dL (ref 30.0–36.0)
MCV: 90 fL (ref 78.0–100.0)
Monocytes Absolute: 0.7 10*3/uL (ref 0.1–1.0)
Monocytes Relative: 10 %
NEUTROS ABS: 3.6 10*3/uL (ref 1.7–7.7)
NEUTROS PCT: 55 %
PLATELETS: 349 10*3/uL (ref 150–400)
RBC: 2.99 MIL/uL — AB (ref 4.22–5.81)
RDW: 17.7 % — ABNORMAL HIGH (ref 11.5–15.5)
WBC: 6.6 10*3/uL (ref 4.0–10.5)

## 2015-08-28 LAB — MAGNESIUM: MAGNESIUM: 1.3 mg/dL — AB (ref 1.7–2.4)

## 2015-08-28 LAB — COMPREHENSIVE METABOLIC PANEL
ALBUMIN: 1.6 g/dL — AB (ref 3.5–5.0)
ALT: 10 U/L — ABNORMAL LOW (ref 17–63)
ANION GAP: 7 (ref 5–15)
AST: 22 U/L (ref 15–41)
Alkaline Phosphatase: 80 U/L (ref 38–126)
BUN: 8 mg/dL (ref 6–20)
CHLORIDE: 99 mmol/L — AB (ref 101–111)
CO2: 33 mmol/L — ABNORMAL HIGH (ref 22–32)
Calcium: 8.1 mg/dL — ABNORMAL LOW (ref 8.9–10.3)
Creatinine, Ser: 0.32 mg/dL — ABNORMAL LOW (ref 0.61–1.24)
GFR calc Af Amer: >60 mL/min (ref >60)
Glucose, Bld: 100 mg/dL — ABNORMAL HIGH (ref 65–99)
POTASSIUM: 3.3 mmol/L — AB (ref 3.5–5.1)
Sodium: 139 mmol/L (ref 135–145)
TOTAL PROTEIN: 5.2 g/dL — AB (ref 6.5–8.1)
Total Bilirubin: 0.6 mg/dL (ref 0.3–1.2)

## 2015-08-28 LAB — GLUCOSE, CAPILLARY
GLUCOSE-CAPILLARY: 113 mg/dL — AB (ref 65–99)
Glucose-Capillary: 95 mg/dL (ref 65–99)
Glucose-Capillary: 114 mg/dL — ABNORMAL HIGH (ref 65–99)
Glucose-Capillary: 118 mg/dL — ABNORMAL HIGH (ref 65–99)

## 2015-08-28 LAB — PROTIME-INR
INR: 2.38 — AB (ref 0.00–1.49)
Prothrombin Time: 25.7 seconds — ABNORMAL HIGH (ref 11.6–15.2)

## 2015-08-28 MED ORDER — ALPRAZOLAM 0.25 MG PO TABS
0.2500 mg | ORAL_TABLET | Freq: Three times a day (TID) | ORAL | Status: DC | PRN
Start: 2015-08-28 — End: 2015-09-03
  Administered 2015-08-28 – 2015-09-03 (×5): 0.25 mg via ORAL
  Filled 2015-08-28 (×5): qty 1

## 2015-08-28 MED ORDER — MAGNESIUM OXIDE 400 (241.3 MG) MG PO TABS
400.0000 mg | ORAL_TABLET | Freq: Two times a day (BID) | ORAL | Status: DC
  Administered 2015-08-28 – 2015-08-30 (×5): 400 mg via ORAL
  Filled 2015-08-28 (×5): qty 1

## 2015-08-28 MED ORDER — HYDROCERIN EX CREA
TOPICAL_CREAM | Freq: Two times a day (BID) | CUTANEOUS | Status: DC
  Administered 2015-08-28 – 2015-08-29 (×2): via TOPICAL
  Administered 2015-08-29: 1 via TOPICAL
  Administered 2015-08-30 – 2015-09-17 (×34): via TOPICAL
  Administered 2015-09-18: 1 via TOPICAL
  Administered 2015-09-18 – 2015-09-19 (×2): via TOPICAL
  Administered 2015-09-19: 1 via TOPICAL
  Administered 2015-09-20 – 2015-09-24 (×9): via TOPICAL
  Filled 2015-08-28 (×2): qty 113

## 2015-08-28 MED ORDER — COLLAGENASE 250 UNIT/GM EX OINT
TOPICAL_OINTMENT | Freq: Two times a day (BID) | CUTANEOUS | Status: DC
  Administered 2015-08-28 – 2015-08-29 (×2): via TOPICAL
  Administered 2015-08-29: 1 via TOPICAL
  Administered 2015-08-30: 11:00:00 via TOPICAL
  Administered 2015-08-30: 1 via TOPICAL
  Administered 2015-08-31 – 2015-09-10 (×20): via TOPICAL
  Administered 2015-09-10: 1 via TOPICAL
  Administered 2015-09-11 – 2015-09-12 (×3): via TOPICAL
  Administered 2015-09-12: 1 via TOPICAL
  Administered 2015-09-13 – 2015-09-15 (×6): via TOPICAL
  Filled 2015-08-28 (×5): qty 30

## 2015-08-28 NOTE — Progress Notes (Signed)
ANTICOAGULATION CONSULT NOTE - Follow Up Consult  Pharmacy Consult for Coumadin Indication: DVT  Allergies  Allergen Reactions  . Levaquin [Levofloxacin] Nausea Only  . Penicillins Hives  . Doxycycline Rash    Patient Measurements: Height: 5\' 9"  (175.3 cm) Weight: 268 lb 1.3 oz (121.6 kg) IBW/kg (Calculated) : 70.7  Vital Signs: Temp: 98.1 F (36.7 C) (10/14 0623) Temp Source: Oral (10/14 0623) BP: 115/72 mmHg (10/14 0623) Pulse Rate: 106 (10/14 1249)  Labs:  Recent Labs  08/26/15 0945 08/26/15 1202 08/27/15 0620 08/27/15 0920 08/28/15 0430  HGB  --   --   --   --  8.2*  HCT  --   --   --   --  26.9*  PLT  --   --   --   --  349  LABPROT 26.1*  --  26.4*  --  25.7*  INR 2.43*  --  2.46*  --  2.38*  CREATININE 0.33* 0.34*  --  0.33* 0.32*    Estimated Creatinine Clearance: 110.7 mL/min (by C-G formula based on Cr of 0.32).   Assessment: [age] YOM with prolonged hospital/ICU stay d/t sepsis/abdominal infection [facility]. He is continued on coumadin for new LLE DVT (08/05/2015). [facility]38 [facility]  [facility] has been stable and therapeutic on 2.5mg  daily for the past week. Hgb 8.2, Plt wnl. Continue flagyl current plan is through 10/26. No bleeding noted per chart.   Goal of Therapy:  INR 2-3 Monitor platelets by anticoagulation protocol: Yes   Plan:  Warf 2.5mg  daily  Daily INR  Bayard HuggerMei Yanai Hobson, PharmD, BCPS  Clinical Pharmacist  Pager: [phone]   08/28/2015,2:40 PM

## 2015-08-28 NOTE — Plan of Care (Signed)
Problem: RH BLADDER ELIMINATION Goal: RH STG MANAGE BLADDER WITH ASSISTANCE STG Manage Bladder With Mod Assistance  Outcome: Not Progressing Foley requires total assist  Goal: RH STG MANAGE BLADDER WITH EQUIPMENT WITH ASSISTANCE STG Manage Bladder With Equipment With Mod Assistance  Outcome: Not Progressing total assist   Problem: RH KNOWLEDGE DEFICIT Goal: RH STG INCREASE KNOWLEDGE OF HYPERTENSION Patient/caregiver will verbalize understanding of hypertension and medications prior to discharge with min help from staff.  Outcome: Not Progressing Unable to verbalize understanding of meds at this time

## 2015-08-28 NOTE — Progress Notes (Signed)
[name], MD Physician Signed Physical Medicine and Rehabilitation PMR Pre-admission 08/27/2015 10:53 AM  Related encounter: Admission (Discharged) from 07/25/2015 in [place] PMR Admission Coordinator Pre-Admission Assessment  Patient: Jonathan Le is an 69 y.o., male MRN: [id number] DOB: May 15, 1946 Height:   Weight:    Insurance Information HMO: PPO: PCP: IPA: 80/20: yes OTHER: no HMO PRIMARY: Medicare a and b Policy#: [id number] Subscriber: pt Benefits: Phone #: Passport One Name: 08/25/2015 Eff. Date: 08/14/2010 Deduct: $1288 Out of Pocket Max: none Life Max: none CIR: 100% SNF: 20 full days Outpatient: 80% Co-Pay: 20% Home Health: 100% Co-Pay: none DME: 80% Co-Pay: 20% Providers: pt choice  SECONDARY: AARP supplement Policy#: [id number] Subscriber: pt Benefits: Phone #: [phone] Name: 10/12 Phillip aarp medicare covers deductible and coinsurance of medicare to include coinsurance for lifetime reserve days As of 10/13, pt day 82 of his hospital days this year Day 1-60, no coinsurance for benefit period; day 61-90 $322 per day coinsurance per day that aarp covers. Days 92 to 150 are his life time reserve days with a coinsurance of $644 per day which aarp covers. After day 150 this benefit period, patient covers all cost.   Medicaid Application Date: Case Manager:  Disability Application Date: Case Worker:   Emergency Contact Information Contact Information    Name Relation Home Work Mobile   Libby Spouse [phone]     Nyoka Lint Sister [phone]        Current Medical History  Patient Admitting Diagnosis: Debility after colectomy and recent CABG  History of Present Illness: 69 year old male with PMH of OSA and HTN. 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  Post discharge course complicated by R leg cellulitis after endoscopic vein harvest he was seen by Dr. Prescott Gum in clinic for this,was having some constipation. 8/4 PM he took an enema and was straining in bathroom. He became diaphoretic and had loss of consciousness. In ED he was noted to be profoundly hypotensive with SOB. Found to be in severe septic shock with presumed ischemic colon. He was then taken to the OR emergently by Dr. Donne Hazel and had abdominal colectomy 8/4. Returned to OR 8/6 for abdominal closure with ileostomy.Post operatively, he self extubated on 8/8. He required re-intubation and on 8/19, he subsequently had tracheostomy performed by Dr. Constance Holster of ENT.On 8/24, he was discharged to [facility].  While on [facility], he self de-cannulated at some point and on 9/5, he had a suspected aspiration event and developed respiratory failure.PCCM was consulted and pt was intubated. Later that night, he had ongoing hypotension despite increasing dose of levophed. Renal US earlier in the day revealed findings concerning for peritonitis vs abscess. Pt was transferred to [facility] ICU and [facility] was consulted for further recs 07/21/15.Pt was readmitted to [facility] .  [facility] admission 9/6 until 9/10 for aspiration PNA and respiratory failure. Pt reintubated for worsening septic shock. Emergent drainage of abcess by IR 07/21/15. Drainage from rectum felt by surgery form rectal stump breakdown with peritoneal fluid leakage. Cultures positive for enterococcal abcesses. Trach placed 07/23/15. Pt cleared for d/c back to [facility] 07/25/15 on vent and abdominal wound VAC.  At [facility] admission 07/25/15 to discharge 10/13 pt has been weaned  from vent. #6 XLT trach using PMV with intermittent supervision. Regular diet with thin liquids.  Followed by IR with 3 drains remaining total for abdominal fluid collections. 2 placed 9/6 and 2 placed 9/28 by IR.ON 9/28 between the three drains, 950 ml of cloudy yellow fluid removed. Drains attached to suction bulbs and maintained with routine flushing. Plan by IR to reCT when output decreased to 10 cc/day in each drain per their assessment 08/25/15. Dr. 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 Sharp debridement performed at bedside on 08/19/15. Consideration for wound VAC once starts healing to be considered. Nutritional status of concern and affressive nutritional supplementation recommended. Pt on Meropenem and Metronidazole IV. Pt felt to have reached maximum benefit form LTACH on 08/27/15 and arrangements made to admit to AIR.  Patient's medical record from [facility] has been reviewed by the rehabilitation admission coordinator and physician. Discussed with Dr. Naaman Plummer on 08/25/15 with Admission Coordinator.  NIH Stroke scale: Glascow Coma Scale:  Past Medical History  Past Medical History  Diagnosis Date  . Anxiety   . Hypertension   . Arthritis   . Sleep apnea     wears CPAP nightly  . Glaucoma   . CHF (congestive heart failure)     MI, with open heart surgery x 30 sdays ago per pt. (06/26/15)  . S/P CABG (coronary artery bypass graft)     Family History  family history is not on file.  Prior Rehab/Hospitalizations Has the patient had major surgery during 100 days prior to admission? Yes  Current Medications Meropenem  Metronidazole  Humalog Famotidine Fluticasone nasal Furosemide Guaifenesin Albuterol wafarin Pro-stat thera m plus transderm-scop  Patients Current Diet: Regular diet with thin liquids, Ensure BID, Prostat AWC TID and pudding BID   Precautions / Restrictions     Has the patient had 2 or more falls or a fall with injury in the  past year?No  Prior Activity Level   before admitted for CABG 7/28. [place] which he sold 11/15 to retire. Had been sedentary with stooped posture and bilateral bad knees for about 6 years. Wife states he has gained weight and become more sedentary over the past six years.  Prior Functional Level Self Care: Did the patient need help bathing, dressing, using the toilet or eating? Independent  Indoor Mobility: Did the patient need assistance with walking from room to room (with or without device)? Independent  Stairs: Did the patient need assistance with internal or external stairs (with or without device)? Independent  Functional Cognition: Did the patient need help planning regular tasks such as shopping or remembering to take medications? Independent  Home Assistive Devices / Equipment    Prior Device Use: Indicate devices/aids used by the patient prior to current illness, exacerbation or injury? Walker   Prior Functional Level Current Functional Level  Bed Mobility         Transfers         Mobility - Engineering geologist Transfer         Bladder Continence          Bowel Management  Stair Estate agent  Independent     Special needs/care consideration Oxygen 28% trach collar Special Bed air mattress overlay Trach Size #6 XLT with PMV usuage Skin 08/24/15 Wound progress report by [facility] assessment #1 midline abd incision/surgical14x1cm pink/beefy with granulation tissue no exudate #2 L abd percutaneous drainsPerc sites pink/beefy tissue no exudate #3 r abd percutaneous drainPerc sites  no exudate and pink/beefy tissue #4 sacrococcygeal /b buttocks15X15 cm depth 1.8 cm no odor, serosanguineous exudate small amount. Pink/beefy red tissue,granulationtissue,slough and black/brown eschar #5 r forearm skin tear #6 tracheostomy anterior neck site excoriation no exudate pink/beefy red tissue #7 l upper thigh blister site3x2.5 cm no exudate. Pink/beefy tissue with slough #8 l heel stage IIBlister 3x5 cm no exudate pink/beefy tissue #9 r heel stage II Blister 4x4 cm no exudate pink/beefy tissue #10 r anterior foot Felt to be stable wound beds without s/s infection. S/p surgical debridement by Dr. Noberto Retort to sacral wound 10/5. Bowel mgmt:ileostomy new admission 8/16. Wife has observed care for twice at [facility] and would like further education Bladder mgmt:foley Diabetic mgmt no CPAP nightly pta  Previous Home Environment   Pt has two adult children and his wife has one adult child. Pt's sister, [name] visits daily.  Discharge Living Setting     Social/Family/Support Systems   Pt's wife, Faythe Dingwall visits daily typically 3 pm until 9pm or later. Pt's sister, [name] and her spouse visit daily 2 to 3 hrs during the day. Pt's wife, Faythe Dingwall and pt are decision makers for his care, not his sister, [name]. Earlier education times and family education with wife are easily arranged by contacted wife directly, not through his sister. Pt and wife request d/c plans and education be arranged directly with [place].  Goals/Additional Needs   Pt and wife are aware that if pt does not progress to level for direct d/c home that further SNF level rehab would be recommended. Pt has never had SNF care, but is hesitant due to the fact that his Mom was at SNF and eventually died with gangrene requiring amputation of LE. She was at [place] which is 2 miles within his home so that would  be same SNF that he and his wife would likely request if SNF sought. Pt and wife prefer home d/c if possible with pt at min assist short distances or min assist transfers with wound care and needs could be met with home health support.  Patient Condition: I have reviewed all medial records from [facility]. Patient's condition is appropriate for intensive rehabilitative care in an inpatient rehabilitation facility. Marland Kitchen He is currently max assist to total assist with all adls. We will admit pt to inpatient acute rehabilitation on 08/27/2015.  Preadmission Screen Completed By: [name], 08/27/2015 10:53 AM ______________________________________________________________________  Discussed status with Dr. Letta Pate on 08/27/15 at 1037 and received telephone approval for admission today.  Admission Coordinator: [name], time 1900 Date 08/26/2015   Assessment/Plan: Diagnosis:deconditioning after sepsis,respiratory failure 1. Does the need for close, 24 hr/day Medical supervision in concert with the patient's rehab needs make it unreasonable for this patient to be served in a less intensive setting? Yes 2. Co-Morbidities requiring supervision/potential complications: MI, morbid obesity, OSA, skin care, nutrition  3. Due to bladder management, bowel management, safety, skin/wound care, disease management, medication administration, pain management, patient education and colostomy, does the patient require 24 hr/day rehab nursing? Yes 4. Does the patient require coordinated care of a physician, rehab nurse, PT (1-2 hrs/day, 5 days/week) and OT (12 hrs/day, 5 days/week) to address physical and functional deficits in the context of the above medical diagnosis(es)? Yes Addressing deficits in the following areas: balance, endurance, locomotion, strength, transferring, bowel/bladder control, bathing, dressing, feeding, grooming and toileting 5. Can the patient actively participate in an intensive  therapy program of at least 3 hrs of therapy 5 days a week? Yes 6. The potential for patient to make measurable gains while on inpatient rehab is good 7. Anticipated functional outcomes upon discharge from inpatients are: supervision PT, supervision OT, n/a SLP 8. Estimated rehab length of stay to reach the above functional goals is: 14-20 days 9. Does the patient have adequate social supports to accommodate these discharge functional goals? Potentially 10. Anticipated D/C setting: Home 11. Anticipated post D/C treatments: [place] therapy 12. Overall Rehab/Functional Prognosis: good  RECOMMENDATIONS: This patient's condition is appropriate for continued rehabilitative care in the following setting: CIR Patient has agreed to participate in recommended program. Yes Note that insurance prior authorization may be required for reimbursement for recommended care.  Comment:  [name] 08/27/2015

## 2015-08-28 NOTE — Progress Notes (Signed)
Patient information reviewed and entered into eRehab system by Sharlot Sturkey, RN, CRRN, PPS Coordinator.  Information including medical coding and functional independence measure will be reviewed and updated through discharge.     Per nursing patient was given "Data Collection Information Summary for Patients in Inpatient Rehabilitation Facilities with attached "Privacy Act Statement-Health Care Records" upon admission.  

## 2015-08-28 NOTE — Progress Notes (Signed)
Patient ID: Jonathan Le, male   DOB: September 26, 1946, 69 y.o.   MRN: [id number]    Subjective: Pt known to our service as he underwent a subtotal abdominal colectomy with ileostomy for ischemic colon by Dr. Dwain Sarna on [phone].  He had a complicated post op course due to respiratory failure and was discharged to [facility] for vent weaning.  He was readmitted with aspiration PNA and respiratory failure on 9/5.  He had a CT of his abdomen that revealed a large abscess and he had 2 perc drains put in on 9/6.  He was later transferred back to [facility].  [facility] is not on Epic and so I do not have notes from their hospital course.  Apparently, in the interim, he has had 2 more drains placed and the original two remain as well.  He was transferred to CIR last night.  He apparently also has a sacral decubitus wound.  We have been asked to re-evaluate the patient for further recommendations.  Objective: Vital signs in last 24 hours: Temp:  [98.1 F (36.7 C)-98.2 F (36.8 C)] 98.1 F (36.7 C) (10/14 0623) Pulse Rate:  [99-107] 107 (10/14 0916) Resp:  [17-22] 18 (10/14 0916) BP: (103-115)/(67-72) 115/72 mmHg (10/14 0623) SpO2:  [93 %-99 %] 93 % (10/14 0916) FiO2 (%):  [28 %] 28 % (10/14 0916) Weight:  [121.6 kg (268 lb 1.3 oz)] 121.6 kg (268 lb 1.3 oz) (10/13 1600) Last BM Date: 08/27/15  Intake/Output from previous day: 10/13 0701 - 10/14 0700 In: 30  Out: 1785 [Urine:1500; Drains:85; Stool:200] Intake/Output this shift: Total I/O In: 240 [P.O.:240] Out: -   PE: Abd: soft, all drains with serous drainage.  He has 3 on the left and 1 on the right.  +BS, midline wound is almost completely healed.  Ileostomy in RLQ with enteric output Skin: stage 4 sacral decubitus wound which is almost 100% necrotic tissue, please see procedure note for debridement info.  Lab Results:   Recent Labs  08/28/15 0430  WBC 6.6  HGB 8.2*  HCT 26.9*  PLT 349   BMET  Recent Labs  08/27/15 0920 08/28/15 0430  NA 141 139   K 3.2* 3.3*  CL 103 99*  CO2 32 33*  GLUCOSE 128* 100*  BUN 8 8  CREATININE 0.33* 0.32*  CALCIUM 8.1* 8.1*   PT/INR  Recent Labs  08/27/15 0620 08/28/15 0430  LABPROT 26.4* 25.7*  INR 2.46* 2.38*   CMP     Component Value Date/Time   NA 139 08/28/2015 0430   K 3.3* 08/28/2015 0430   CL 99* 08/28/2015 0430   CO2 33* 08/28/2015 0430   GLUCOSE 100* 08/28/2015 0430   BUN 8 08/28/2015 0430   CREATININE 0.32* 08/28/2015 0430   CALCIUM 8.1* 08/28/2015 0430   PROT 5.2* 08/28/2015 0430   ALBUMIN 1.6* 08/28/2015 0430   AST 22 08/28/2015 0430   ALT 10* 08/28/2015 0430   ALKPHOS 80 08/28/2015 0430   BILITOT 0.6 08/28/2015 0430   GFRNONAA >60 08/28/2015 0430   GFRAA >60 08/28/2015 0430   Lipase     Component Value Date/Time   LIPASE 15* 05/19/2015 2140       Studies/Results: No results found.  Anti-infectives: Anti-infectives    Start     Dose/Rate Route Frequency Ordered Stop   08/27/15 2200  meropenem (MERREM) 1 g in sodium chloride 0.9 % 100 mL IVPB     1 g 200 mL/hr over 30 Minutes Intravenous 3 times per day  08/27/15 1658 09/03/15 2359   08/27/15 1615  metroNIDAZOLE (FLAGYL) tablet 500 mg     500 mg Oral 3 times per day 08/27/15 1610 09/10/15 0559       Assessment/Plan  1. S/p subtotal abdominal colectomy with ileostomy and multiple intra-abdominal fluid collections, s/p perc drains -duration of IV abx therapies at this time would need to be deferred to ID -the patient has had 2 of his perc drains for over a month now and the other 2 for about 15 days.  His last CT scan was about a month ago.  All that is coming out of these drains is serous drainage.  His first set of drains grew enterococcus, but cultures do not appear to have been sent with the last 2 drains that were placed.  It would be beneficial to ask IR to evaluate these drains and get their opinion on if/when they think some of these drains can be removed given how long some of these have been in  place. -otherwise, his abdomen, surgically, is doing well 2. Stage 4 sacral decubitus wound -please see procedure note for debridement details -will need santyl with BID dressing changes and hydrotherapy.  He may need more bedside debridement, but hopefully hydrotherapy will correct a lot of this now that they large part of the necrotic tissue has been debrided. -will follow.  LOS: 1 day    Vivyan Biggers E 08/28/2015, 10:47 AM Pager: [phone]

## 2015-08-28 NOTE — Evaluation (Addendum)
Speech Language Pathology Assessment and Plan  Patient Details  Name: Jonathan Le MRN: [id number] Date of Birth: Feb 25, 1946  SLP Diagnosis: Cognitive Impairments;Speech and Language deficits  Rehab Potential: Good ELOS: 14-21 days     Today's Date: 08/28/2015 SLP Individual Time: 0805-0905 SLP Individual Time Calculation (min): 60 min   Problem List:  Patient Active Problem List   Diagnosis Date Noted  . Intra-abdominal abscess ([place])   . Debility 08/27/2015  . Acute on chronic respiratory failure, unspecified whether with hypoxia or hypercapnia ([place])   . Encounter for orogastric (OG) tube placement   . Abdominal abscess ([place])   . Abscess   . Encounter for central line placement   . Encounter for intubation   . Respiratory failure ([place])   . Septic shock ([place])   . Blood poisoning ([place])   . Tracheostomy status ([place])   . Acute respiratory failure ([place])   . Pressure ulcer 06/21/2015  . Shock ([place]) 06/18/2015  . Abdominal pain, generalized   . Shortness of breath   . Hypoxia   . 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 Cardiomyopathy, ischemic 05/21/2015  . CAD, multiple vessel 05/21/2015  . Acute systolic congestive heart failure, NYHA class 3 ([place]) 05/21/2015  . Morbid obesity ([place]) 05/20/2015  . OSA on CPAP 05/20/2015  . Other specified hypotension   . NSTEMI (non-ST elevated myocardial infarction) ([place]) 05/19/2015  . Benign prostatic hyperplasia with urinary obstruction 04/24/2015  . Chronic LBP 12/05/2013  . Essential hypertension 09/04/2013   Past Medical History:  Past Medical History  Diagnosis Date  . Anxiety   . Hypertension   . Arthritis   . Sleep apnea     wears CPAP nightly  . Glaucoma   . CHF (congestive heart failure) (HCC)     MI, with open heart surgery x 30 sdays ago per pt. (06/26/15)  . S/P CABG (coronary artery bypass graft)    Past Surgical History:  Past Surgical History  Procedure Laterality Date  . Hernia repair      right  . Eye surgery     bil cataract  . Wisdom tooth extraction    . Fasciotomy foot / toe Left     foot  . Cardiac catheterization N/A 05/20/2015    Procedure: Left Heart Cath and Coronary Angiography;  Surgeon: Burnell Blanks, MD;  Location: [place] [facility];  Service: Cardiovascular;  Laterality: N/A;  . Coronary artery bypass graft N/A 05/22/2015    Procedure: CORONARY ARTERY BYPASS GRAFTING (CABG), ON PUMP, TIMES THREE, USING LEFT INTERNAL MAMMARY ARTERY, RIGHT GREATER SAPHENOUS VEIN HARVESTED ENDOSCOPICALLY;  Surgeon: Ivin Poot, MD;  Location: [facility];  Service: Open Heart Surgery;  Laterality: N/A;  LIMA-LAD, SVG-OM, SVG-PD  . Tee without cardioversion N/A 05/22/2015    Procedure: TRANSESOPHAGEAL ECHOCARDIOGRAM (TEE);  Surgeon: Ivin Poot, MD;  Location: [place];  Service: Open Heart Surgery;  Laterality: N/A;  . Laparotomy N/A 06/18/2015    Procedure: EXPLORATORY LAPAROTOMY;  Surgeon: Rolm Bookbinder, MD;  Location: [place];  Service: General;  Laterality: N/A;  . Laparotomy N/A 06/20/2015    Procedure: WASHOUT OF ABDOMEN, CLOSURE OF ABDOMINAL WALL WITH PLACEMENT OF WOUND VAC AND ILLEOSTOMY ;  Surgeon: Stark Klein, MD;  Location: [place];  Service: General;  Laterality: N/A;  . Tracheostomy tube placement N/A 07/03/2015    Procedure: TRACHEOSTOMY;  Surgeon: Izora Gala, MD;  Location: [facility];  Service: ENT;  Laterality: N/A;    Assessment / Plan /  Recommendation Clinical Impression   Jonathan Le is a 69 year old with history of HTN, OSA, morbid obesity, anxiety disorder, CAD (s/p CABG 05/22/2015 with post of A fib and RLE cellulitis) who was originally admitted on 06/19/15 with severe abdominal pain with respiratory failure and sepsis due to ischemic bowel. Self extubated on 08/08 but developed respiratory distress with confusion requiring eventual intubation.  Trach placed on 08/16 by Dr. Constance Holster and patient extubated to [facility] and discharged to [facility] on 08/24.   Patient self de-cannulated at some point and had  aspiration event with respiratory failure requiring transfer back to acute hospital on 07/20/15.  He developed septic shock. He developed respiratory failure requiring re-intubation and trach 09/08.  He was transferred back to [facility] on 07/25/15 for vent wean and has been extubated to [facility]. He was downsized to XLT cuffed #6 with recommendations not to downsize trach until improvement.  Pt is tolerating PMSV.  Metabolic encephalopathy has resolved. He has been advanced to regular diet.   Pt was admitted to CIR on 08/27/2015.  SLP evaluation was completed on 08/28/2015 with the following results:     Pt presents with good toleration of Passy Muir speaking valve and was wearing valve prior to SLP's arrival.  Pt's O2 sats remained between 93-96, heart rate remained between 104-106, no evidence of O2 trapping with multiple valve removals, and pt exhibited no overt signs of distress with valve placement.  Pt vocal quality remained clear and strong with valve in place.  No audible secretions noted during evaluation, although pt reports he is able to orally expectorate secretions when needed.  Recommend that pt wear speaking valve during all waking hours (remove when sleeping) and during all PO intake.  Pt does require education for proper care, use, and cleaning of valve.  Pt also presents with grossly intact swallowing function and demonstrated no overt s/s of aspiration with regular solids or thin liquids.  Pt was able to effectively masticate and clear solids from the oral cavity post swallow and is independent for self-feeding.  Recommend that pt remain on regular textures and thin liquids.  No further ST needs indicated for swallowing.    Additionally, pt demonstrates mild higher level cognitive deficits characterized by decreased recall of new information and decreased functional problem solving likely resulting from residual effects of encephalopathy.   Pt would benefit from skilled ST while inpatient in order  to maximize functional independence and reduce burden of care prior to discharge.  Pt may need additional ST services following discharging pending progress made while inpatient; however, SLP is hopeful that as pt becomes more medically stable that his mentation will continue to improve.    Skilled Therapeutic Interventions          Cognitive-linguistic, Passy-muir speaking valve, and bedside swallowing evaluation completed with results and recommendations reviewed with patient.       SLP Assessment  Patient will need skilled Speech Lanaguage Pathology Services during CIR admission    Recommendations  Patient may use Passy-Muir Speech Valve: During all waking hours (remove during sleep) PMSV Supervision: Intermittent MD: Please consider changing trach tube to : Smaller size SLP Diet Recommendations: Age appropriate regular solids;Thin Liquid Administration via: Cup;Straw Medication Administration: Whole meds with liquid Supervision: Patient able to self feed Compensations: Other (Comment) (wear speaking valve with meals ) Postural Changes and/or Swallow Maneuvers: Seated upright 90 degrees Oral Care Recommendations: Oral care BID Recommendations for Other Services: Neuropsych consult Patient destination: Home Follow up Recommendations:  (  TBD pending progress made while inpatient) Equipment Recommended: To be determined    SLP Frequency 15 hours over 7 days of combined   SLP Treatment/Interventions Cognitive remediation/compensation;Cueing hierarchy;Patient/family education;Speech/Language facilitation;Internal/external aids;Functional tasks   Pain Pain Assessment Pain Assessment: No/denies pain Prior Functioning Cognitive/Linguistic Baseline: Baseline deficits Baseline deficit details: pt reports baseline memory deficits  Type of Home: House  Lives With: Spouse Available Help at Discharge: Family;Available 24 hours/day  Function:  Eating Eating     Eating Assist Level: More  than reasonable amount of time           Cognition Comprehension Comprehension assist level: Understands basic 90% of the time/cues < 10% of the time  Expression Expression assistive device: Talk trach valve Expression assist level: Expresses basic needs/ideas: With extra time/assistive device  Social Interaction Social Interaction assist level: Interacts appropriately 90% of the time - Needs monitoring or encouragement for participation or interaction.  Problem Solving Problem solving assist level: Solves basic 50 - 74% of the time/requires cueing 25 - 49% of the time  Memory Memory assist level: Recognizes or recalls 50 - 74% of the time/requires cueing 25 - 49% of the time   Short Term Goals: Week 1: SLP Short Term Goal 1 (Week 1): Pt will return demonstration of how to donn/doff speaking valve over 3 targeted sessions with supervision.  SLP Short Term Goal 2 (Week 1): Pt will recall steps of how to clean speaking valve over 3 targeted sessions with supervision.  SLP Short Term Goal 3 (Week 1): Pt will utliize compensatory strategies to facilitate recall of daily information with min assist verbal cues.   SLP Short Term Goal 4 (Week 1): Pt will complete basic to semi-complex self care and/or home management tasks with min assist verbal cues for functional problem solving  SLP Short Term Goal 5 (Week 1): Pt will recognize and correct errors in the moment during functional tasks with min assist verbal cues.   Refer to Care Plan for Long Term Goals  Recommendations for other services: Neuropsych  Discharge Criteria: Patient will be discharged from SLP if patient refuses treatment 3 consecutive times without medical reason, if treatment goals not met, if there is a change in medical status, if patient makes no progress towards goals or if patient is discharged from hospital.  The above assessment, treatment plan, treatment alternatives and goals were discussed and mutually agreed upon: by  patient  Ares Tegtmeyer, Selinda Orion 08/28/2015, 3:21 PM

## 2015-08-28 NOTE — Progress Notes (Signed)
Referring Physician(s): Derrell Lolling  Chief Complaint:  abd abscesses B drains placed 9/6 Then 2 additional drains placed L abd 9/29   Subjective:  Stopped flushing Rt drain after CT 10/7 No real output noted for days prior Output L drains still quite significant Flushing well and aspirating well all L drains  Allergies: Levaquin; Penicillins; and Doxycycline  Medications: Prior to Admission medications   Medication Sig Start Date End Date Taking? Authorizing Provider  Amino Acids-Protein Hydrolys (FEEDING SUPPLEMENT, PRO-STAT SUGAR FREE 64,) LIQD Place 30 mLs into feeding tube 5 (five) times daily. 07/25/15  Yes Jeanella Craze, NP  white petrolatum (VASELINE) GEL Apply 1 application topically daily. Apply to L nare wound daily 07/08/15  Yes Jeanella Craze, NP     Vital Signs: BP 115/72 mmHg  Pulse 107  Temp(Src) 98.1 F (36.7 C) (Oral)  Resp 18  Ht  (1.753 m)  Wt 268 lb 1.3 oz (121.6 kg)  BMI 39.57 kg/m2  SpO2 93%  Physical Exam  Abdominal: Soft.  Skin: Skin is warm.  All drain sites clean and dry NT No sign of infection No bleeding Output all L drains significant All milky to pinkish fluid Flushed myself without issue Aspirated well afeb Wbc wnl    Imaging: No results found.  Labs:  CBC:  Recent Labs  08/07/15 0700 08/16/15 0701 08/20/15 0648 08/28/15 0430  WBC 8.7 9.2 5.2 6.6  HGB 7.4* 7.5* 8.8* 8.2*  HCT 24.0* 25.6* 29.3* 26.9*  PLT 418* 387 328 349    COAGS:  Recent Labs  07/21/15 0815  07/23/15 1200 07/25/15 0419 07/30/15 0627  08/25/15 0700 08/26/15 0945 08/27/15 0620 08/28/15 0430  INR 1.65*  < > 2.62* 1.48 1.35  < > 2.16* 2.43* 2.46* 2.38*  APTT 29  --  38* 28 31  --   --   --   --   --   < > = values in this interval not displayed.  BMP:  Recent Labs  08/26/15 0945 08/26/15 1202 08/27/15 0920 08/28/15 0430  NA 137 135 141 139  K 2.5* 2.8* 3.2* 3.3*  CL 97* 97* 103 99*  CO2 34* 32 32 33*  GLUCOSE 82 130*  128* 100*  BUN CALCIUM 7.8* 7.8* 8.1* 8.1*  CREATININE 0.33* 0.34* 0.33* 0.32*  GFRNONAA >60 >60 >60 >60  GFRAA >60 >60 >60 >60    LIVER FUNCTION TESTS:  Recent Labs  07/23/15 0600 07/24/15 0345 07/25/15 0419  07/28/15 0630 08/02/15 0620 08/16/15 0701 08/28/15 0430  BILITOT 1.0 0.7 0.9  --   --   --   --  0.6  AST 30 40 35  --   --   --   --  22  ALT 15* 15* 14*  --   --   --   --  10*  ALKPHOS 154* 167* 199*  --   --   --   --  80  PROT 4.6* 4.3* 4.5*  --   --   --   --  5.2*  ALBUMIN <1.0* <1.0* <1.0*  < > 1.4* 1.2* 1.2* 1.6*  < > = values in this interval not displayed.  Assessment and Plan:  abd abscess drains intact Will follow Will need re CT when output minimal  Signed: Dimitrius Steedman A 08/28/2015, 10:50 AM   I spent a total of 15 Minutes at the the patient's bedside AND on the patient's hospital floor or unit, greater than  50% of which was counseling/coordinating care for abd abscesses

## 2015-08-28 NOTE — Consult Note (Addendum)
[facility]  Total days of antibiotics 2        Day 1 meropenem               Reason for Consult: intra abdominal infection    Referring Physician: [email or web address]  Active Problems:   Debility   Intra-abdominal abscess ([place])    HPI: Jonathan Le is a 69 y.o. male with PMH of obesity,OSA HTN, 3 vessel CABG 05/22/2015 AF-RVR. He has been hospitalized-LTACH- CIR for the past 2 months. He was initially admitted on 8/4 for loss of consciousness and  profoundly hypotensive with severe septic shock with presumed ischemic colon. He was then taken to the OR emergently by Dr. Donne Hazel and had abdominal colectomy 8/4. Returned to OR 8/6 for abdominal closure with ileostomy. Post operatively, he self extubated on 8/8. He required re-intubation and on 8/19, he subsequently had tracheostomy performed by Dr. Constance Holster of ENT.On 8/24, he was discharged to [facility] for vent wean but at some point may have had aspiration event due to decannulation. He was readmitted on 9/5 to [facility] due to respiratory failure and hypotension. Renal US earlier in the day revealed findings concerning for peritonitis vs abscess. He had a CT of his abdomen that revealed a large abscess and he had 2 perc drains put in on 9/6. He was discharged back to [facility] on 9/10 with IV vanco to treat intra abd abscess. in the interim, he has had 2 more drains placed and the original two remain as well.it appears that he was originally on vanco and antifungal then changed to meropenem plus metronidazole for unclear reasons for double anaerobic coverage. He was transferred to [facility] on 10/13 for further management. He was continued on meropenem plus metronidazole . He has full thickness sacral decubitus wound that surgery has attempted bedside debridement.he is afebrile, without leukocytosis. The patient denies fever chills, nightsweats, abdominal pain, no sob, cough, and unclear how much out put he has from abdominal drains   He  is known to have amp S enterococcus from cx from 9/6.   Past Medical History  Diagnosis Date  . Anxiety   . Hypertension   . Arthritis   . Sleep apnea     wears CPAP nightly  . Glaucoma   . CHF (congestive heart failure) (HCC)     MI, with open heart surgery x 30 sdays ago per pt. (06/26/15)  . S/P CABG (coronary artery bypass graft)     Allergies:  Allergies  Allergen Reactions  . Levaquin [Levofloxacin] Nausea Only  . Penicillins Hives  . Doxycycline Rash    MEDICATIONS: . antiseptic oral rinse  15 mL Mouth Rinse TID PC  . collagenase   Topical BID  . famotidine  20 mg Oral BID  . feeding supplement (ENSURE ENLIVE)  237 mL Oral BID BM  . feeding supplement (PRO-STAT SUGAR FREE 64)  30 mL Oral TID  . furosemide  60 mg Oral Daily  . insulin aspart  0-5 Units Subcutaneous QHS  . insulin aspart  0-9 Units Subcutaneous TID WC  . ipratropium-albuterol  3 mL Nebulization Q6H  . magnesium oxide  400 mg Oral BID  . meropenem (MERREM) IV  1 g Intravenous 3 times per day  . metroNIDAZOLE  500 mg Oral 3 times per day  . multivitamin with minerals  1 tablet Oral Daily  . potassium chloride  20 mEq Oral TID  . scopolamine  1 patch Transdermal Q72H  .  vitamin C  500 mg Oral BID  . warfarin  2.5 mg Oral q1800  . Warfarin - Pharmacist Dosing Inpatient   Does not apply q1800    Social History  Substance Use Topics  . Smoking status: Never Smoker   . Smokeless tobacco: Never Used  . Alcohol Use: No    History reviewed. No pertinent family history.   Review of Systems  Constitutional: Negative for fever, chills, diaphoresis, activity change, appetite change, fatigue and unexpected weight change.  HENT: Negative for congestion, sore throat, rhinorrhea, sneezing, trouble swallowing and sinus pressure.  Eyes: Negative for photophobia and visual disturbance.  Respiratory: Negative for cough, chest tightness, shortness of breath, wheezing and stridor.  Cardiovascular: Negative for  chest pain, palpitations and leg swelling.  Gastrointestinal: Negative for nausea, vomiting, abdominal pain, diarrhea, constipation, blood in stool, abdominal distention and anal bleeding.  Genitourinary: Negative for dysuria, hematuria, flank pain and difficulty urinating.  Musculoskeletal: Negative for myalgias, back pain, joint swelling, arthralgias and gait problem.  Skin: decubitus ulcers from prolonged hospitalization Neurological: Negative for dizziness, tremors, weakness and light-headedness.  Hematological: Negative for adenopathy. Does not bruise/bleed easily.  Psychiatric/Behavioral: Negative for behavioral problems, confusion, sleep disturbance, dysphoric mood, decreased concentration and agitation.     OBJECTIVE: Temp:  [98.1 F (36.7 C)-98.2 F (36.8 C)] 98.1 F (36.7 C) (10/14 0623) Pulse Rate:  [99-107] 107 (10/14 1528) Resp:  [16-22] 22 (10/14 1528) BP: (103-115)/(67-72) 115/72 mmHg (10/14 0623) SpO2:  [91 %-99 %] 91 % (10/14 1528) FiO2 (%):  [28 %] 28 % (10/14 1528) Weight:  [268 lb 1.3 oz (121.6 kg)] 268 lb 1.3 oz (121.6 kg) (10/13 1600) Physical Exam  Constitutional: He is oriented to person, place, and time. He appears well-developed and well-nourished. No distress.  HENT: s/p trach Mouth/Throat: Oropharynx is clear and moist. No oropharyngeal exudate.  Cardiovascular: Normal rate, regular rhythm and normal heart sounds. Exam reveals no gallop and no friction rub.  No murmur heard.  Pulmonary/Chest: Effort normal and breath sounds normal. No respiratory distress. He has no wheezes.  Abdominal: Soft. Bowel sounds are decreased He exhibits no distension. There is no tenderness. He has 1 drain in RLQ, plus colostomy with formed stool in ostomy back. He has 3 drains in Left Lower quadrant. Midline incision, open with good granulation bed healing. Lymphadenopathy:  He has no cervical adenopathy.  Neurological: He is alert and oriented to person, place, and time.    Skin: Skin is warm and dry. No rash noted. No erythema. RUE piccline is c/d/i Ext: pitting edema, anasarca Psychiatric: He has a normal mood and affect. His behavior is normal.     LABS: Results for orders placed or performed during the hospital encounter of 08/27/15 (from the past 48 hour(s))  Glucose, capillary     Status: Abnormal   Collection Time: 08/27/15  8:48 PM  Result Value Ref Range   Glucose-Capillary 114 (H) 65 - 99 mg/dL  Protime-INR     Status: Abnormal   Collection Time: 08/28/15  4:30 AM  Result Value Ref Range   Prothrombin Time 25.7 (H) 11.6 - 15.2 seconds   INR 2.38 (H) 0.00 - 1.49  CBC WITH DIFFERENTIAL     Status: Abnormal   Collection Time: 08/28/15  4:30 AM  Result Value Ref Range   WBC 6.6 4.0 - 10.5 K/uL   RBC 2.99 (L) 4.22 - 5.81 MIL/uL   Hemoglobin 8.2 (L) 13.0 - 17.0 g/dL   HCT 26.9 (L) 39.0 -  52.0 %   MCV 90.0 78.0 - 100.0 fL   MCH 27.4 26.0 - 34.0 pg   MCHC 30.5 30.0 - 36.0 g/dL   RDW 17.7 (H) 11.5 - 15.5 %   Platelets 349 150 - 400 K/uL   Neutrophils Relative % 55 %   Neutro Abs 3.6 1.7 - 7.7 K/uL   Lymphocytes Relative 23 %   Lymphs Abs 1.5 0.7 - 4.0 K/uL   Monocytes Relative 10 %   Monocytes Absolute 0.7 0.1 - 1.0 K/uL   Eosinophils Relative 11 %   Eosinophils Absolute 0.7 0.0 - 0.7 K/uL   Basophils Relative 1 %   Basophils Absolute 0.1 0.0 - 0.1 K/uL  Comprehensive metabolic panel     Status: Abnormal   Collection Time: 08/28/15  4:30 AM  Result Value Ref Range   Sodium 139 135 - 145 mmol/L   Potassium 3.3 (L) 3.5 - 5.1 mmol/L   Chloride 99 (L) 101 - 111 mmol/L   CO2 33 (H) 22 - 32 mmol/L   Glucose, Bld 100 (H) 65 - 99 mg/dL   BUN 8 6 - 20 mg/dL   Creatinine, Ser 0.32 (L) 0.61 - 1.24 mg/dL   Calcium 8.1 (L) 8.9 - 10.3 mg/dL   Total Protein 5.2 (L) 6.5 - 8.1 g/dL   Albumin 1.6 (L) 3.5 - 5.0 g/dL   AST 22 15 - 41 U/L   ALT 10 (L) 17 - 63 U/L   Alkaline Phosphatase 80 38 - 126 U/L   Total Bilirubin 0.6 0.3 - 1.2 mg/dL   GFR  calc non Af Amer >60 >60 mL/min   GFR calc Af Amer >60 >60 mL/min    Comment: (NOTE) The eGFR has been calculated using the CKD EPI equation. This calculation has not been validated in all clinical situations. eGFR's persistently <60 mL/min signify possible Chronic Kidney Disease.    Anion gap 7 5 - 15  Magnesium     Status: Abnormal   Collection Time: 08/28/15  4:30 AM  Result Value Ref Range   Magnesium 1.3 (L) 1.7 - 2.4 mg/dL  Glucose, capillary     Status: None   Collection Time: 08/28/15  6:26 AM  Result Value Ref Range   Glucose-Capillary 95 65 - 99 mg/dL   Comment 1 Notify RN   Glucose, capillary     Status: Abnormal   Collection Time: 08/28/15 11:59 AM  Result Value Ref Range   Glucose-Capillary 114 (H) 65 - 99 mg/dL   Comment 1 Notify RN     IMAGING ABD CT 10/7: left lower quadrant intra abd fluid collection noted FINDINGS: Severe degenerative disc disease is noted at L2-3 and L3-4 with anterior osteophyte formation. Stable moderate left pleural effusion is noted with adjacent atelectasis of the left lower lobe.  No gallstones are noted. No focal abnormality is noted in the liver, spleen or pancreas. Adrenal glands appear normal. Bilateral nephrolithiasis is noted. No hydronephrosis or renal obstruction is noted. No ureteral calculi are noted. Atherosclerosis of abdominal aorta is noted without aneurysm formation. Urinary bladder is decompressed secondary to Foley catheter. Ostomy is again noted in right lower quadrant status post colectomy.  Drainage catheters again noted in fluid collection along the right anterior abdominal wall, which currently measures 8.0 x 2.5 cm in size and is not significantly changed. Another drainage catheter is seen in fluid collection along the left lateral wall of the peritoneal space, which measures 4.7 x 3.2 cm and is significantly smaller compared  to prior exam. These catheters were present on the prior exam.  There is been  interval placement of 2 new drainage catheters in the multiloculated fluid collection seen along the left lateral musculature of the abdominal wall. These fluid collections appear to be nearly completely decompressed and significantly improved compared to prior exam.  IMPRESSION: Abscess seen along anterior abdominal wall in right side of abdomen is not significantly changed compared to prior exam. Drainage catheter remains within this fluid collection.  Fluid collection which had been previously drained along the left lateral wall of the abdominal space it is slightly smaller compared to prior exam.  There has been interval placement of 2 new drainage catheters into multiloculated fluid collection described on prior CT scan in the left lateral abdominal wall ; this fluid collection appears to be almost completely decompressed status post drainage.   Assessment/Plan:  Intra-abodminal infection including enterococcus  - continue with meropenem for now, will complete 14 day course and repeat imaging next week to see if need to stop antibiotics. Consider removing drains that have poor output and look like they are not needed based upon 10/7 imaging vs. Waiting for repeat imaging early next week.  - will d/c metronidazole. No need for double anaerobic coverage  - decub sacral wound = continue to debride as tolerated per [facility] and wound care. Antibiotics would also treat this area  - hypoalbunemia due to protein-caloric malnutrition = continue to advocate for improved nutrition, increase protein intake to help with wound healing, check pre-albumin  Dr Lucianne Lei DAm to provide further recs on Monday  Alphonse Asbridge B. [facility] [phone]

## 2015-08-28 NOTE — Progress Notes (Signed)
Patient refused both doses last HS of Merrem IVPB during shift stating "I won't take both this and Flagyl, I will only take one of them", education attempted without success. Patient choosing to take flagyl. Patient arrived from [facility] with foley in place, per PA foley to be changed today 08/28/15.  Report given to day shift RN.

## 2015-08-28 NOTE — Procedures (Signed)
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  He has in the interim developed a sacral decubitus ulcer that is 100% necrotic tissue.    Anesthesia: not needed  Procedure Details  The procedure, risks and complications have been discussed in detail (including, but not limited to infection, bleeding) with the patient, and the patient has given verbal consent to the procedure.  The patient was placed in right lateral decubitus position.  A pair of scissors and forceps was used to debride necrotic tissue from this stage 4 decubitus ulcer.  There was a small pocket of purulent drainage at the 9 oclock position that was opened up with debridement.  There was significant tunneling of the superior aspect of the wound.  Further necrotic tissue was present but due to significant tunneling and overlying skin edge, I was unable to continue debridement as I was unable to reach it.  Once completely, the wound was packed with NS WD dressing.  2. Tool used for debridement (curette, scapel, etc.) Scissors  3. Frequency of surgical debridement. Once may need additional  4. Measurement of total devitalized tissue (wound surface) before and after surgical debridement. 6x6x2cm before, 10x6x3cm after  5. Area and depth of devitalized tissue removed from wound. 5x3x1.5cm cm  6. Blood loss and description of tissue removed. minimal; necrotic  7. Evidence of the progress of the wound's response to treatment. A. Current wound volume (current dimensions and depth). 10x6x3cm B. Presence (and extent of) of infection. None after debridement C. Presence (and extent of) of non viable tissue. presence, still mostly  necrotic D. Other material in the wound that is expected to inhibit healing. necrotic tissue  8. Was there any viable tissue removed (measurements): no  Condition: Tolerated procedure well and Stable   Complications: none.  Berenise Hunton E 11:11 AM 08/28/2015

## 2015-08-28 NOTE — Progress Notes (Addendum)
 Subjective/Complaints: Patient appears tired this morning but Stasiak she slept better than yesterday. He's been sleeping a lot during the day while he was at the [facility].  Review systems negative for shortness of breath negative for chest pain positive for sacral pain when he lays on his back  Objective: Vital Signs: Blood pressure 101/65, pulse 107, temperature 98.1 F (36.7 C), temperature source Oral, resp. rate 19, height 5' 9" (1.753 m), weight 121.6 kg (268 lb 1.3 oz), SpO2 97 %. No results found. Results for orders placed or performed during the hospital encounter of 08/27/15 (from the past 72 hour(s))  Glucose, capillary     Status: Abnormal   Collection Time: 08/27/15  8:48 PM  Result Value Ref Range   Glucose-Capillary 114 (H) 65 - 99 mg/dL  Protime-INR     Status: Abnormal   Collection Time: 08/28/15  4:30 AM  Result Value Ref Range   Prothrombin Time 25.7 (H) 11.6 - 15.2 seconds   INR 2.38 (H) 0.00 - 1.49  CBC WITH DIFFERENTIAL     Status: Abnormal   Collection Time: 08/28/15  4:30 AM  Result Value Ref Range   WBC 6.6 4.0 - 10.5 K/uL   RBC 2.99 (L) 4.22 - 5.81 MIL/uL   Hemoglobin 8.2 (L) 13.0 - 17.0 g/dL   HCT 26.9 (L) 39.0 - 52.0 %   MCV 90.0 78.0 - 100.0 fL   MCH 27.4 26.0 - 34.0 pg   MCHC 30.5 30.0 - 36.0 g/dL   RDW 17.7 (H) 11.5 - 15.5 %   Platelets 349 150 - 400 K/uL   Neutrophils Relative % 55 %   Neutro Abs 3.6 1.7 - 7.7 K/uL   Lymphocytes Relative 23 %   Lymphs Abs 1.5 0.7 - 4.0 K/uL   Monocytes Relative 10 %   Monocytes Absolute 0.7 0.1 - 1.0 K/uL   Eosinophils Relative 11 %   Eosinophils Absolute 0.7 0.0 - 0.7 K/uL   Basophils Relative 1 %   Basophils Absolute 0.1 0.0 - 0.1 K/uL  Comprehensive metabolic panel     Status: Abnormal   Collection Time: 08/28/15  4:30 AM  Result Value Ref Range   Sodium 139 135 - 145 mmol/L   Potassium 3.3 (L) 3.5 - 5.1 mmol/L   Chloride 99 (L) 101 - 111 mmol/L   CO2 33 (H) 22 - 32 mmol/L    Glucose, Bld 100 (H) 65 - 99 mg/dL   BUN 8 6 - 20 mg/dL   Creatinine, Ser 0.32 (L) 0.61 - 1.24 mg/dL   Calcium 8.1 (L) 8.9 - 10.3 mg/dL   Total Protein 5.2 (L) 6.5 - 8.1 g/dL   Albumin 1.6 (L) 3.5 - 5.0 g/dL   AST 22 15 - 41 U/L   ALT 10 (L) 17 - 63 U/L   Alkaline Phosphatase 80 38 - 126 U/L   Total Bilirubin 0.6 0.3 - 1.2 mg/dL   GFR calc non Af Amer >60 >60 mL/min   GFR calc Af Amer >60 >60 mL/min    Comment: (NOTE) The eGFR has been calculated using the CKD EPI equation. This calculation has not been validated in all clinical situations. eGFR's persistently <60 mL/min signify possible Chronic Kidney Disease.    Anion gap 7 5 - 15  Magnesium     Status: Abnormal   Collection Time: 08/28/15  4:30 AM  Result Value Ref Range   Magnesium 1.3 (L) 1.7 - 2.4 mg/dL  Glucose, capillary       Status: None   Collection Time: 08/28/15  6:26 AM  Result Value Ref Range   Glucose-Capillary 95 65 - 99 mg/dL   Comment 1 Notify RN   Glucose, capillary     Status: Abnormal   Collection Time: 08/28/15 11:59 AM  Result Value Ref Range   Glucose-Capillary 114 (H) 65 - 99 mg/dL   Comment 1 Notify RN   Glucose, capillary     Status: Abnormal   Collection Time: 08/28/15  4:56 PM  Result Value Ref Range   Glucose-Capillary 113 (H) 65 - 99 mg/dL   Comment 1 Notify RN     Nursing note and vitals reviewed. Constitutional: He is oriented to person, place, and time.he is slower today He appears well-developed and well-nourished. No distress.  HENT:  Head: Normocephalic and atraumatic.  Mouth/Throat: Oropharynx is clear and moist.  Eyes: Conjunctivae and EOM are normal. Pupils are equal, round, and reactive to light.  Neck: Decreased range of motion present.  Trach site with dry dressing and ATC in place.  Cardiovascular: Normal rate and regular rhythm. noES Respiratory: Effort normal and breath sounds normal. No respiratory distress. He has no wheezes. He exhibits no tenderness.  Sternotomy  incision is clean,dry and intact--healing well.  GI: Soft. Bowel sounds are normal.  Midline incision has healed to surface but not epithealized yet. Ostomy with bilious drainage. Multiple drains LLQ sutured in place and mild irritation at entry site.  Genitourinary:  Min scrotal edema noted. Foley in place.  Musculoskeletal: He exhibits edema (LLE with 1+ edema tibially and 2+ pedally. RLE with 1+ edema pedally. ). He exhibits no tenderness.  Right knee with 1-2+ effusion. No tenderness with ROM. Tends to keep RLE rotated outward.  Neurological: He is alert and oriented to person, place, and time.  Able to phonate well with PMSV. Speech clear. Follows basic commands without difficulty.  Skin: Skin is warm and dry. He is not diaphoretic.  Right lower shin with stasis changes. Multiple ulcers on bilateral heels,the right heel blister is partially hemorrhagic, left heel has a small eschar right foot, left thigh covered with foam and opsite. Psychiatric: He has a normal mood and affect. His speech is normal and behavior is normal. Cognition and memory are normal.  Motor strength is 3 minus bilateral deltoid 4 minus bicep tricep grip, 2 minus bilateral hip flexors 3 minus knee extensors 3 minus bilateral ankle dorsiflexor plantar flexor Sensation is absent in bilateral feet to light touch. Present at the knees Fingers has some difficulty identifying light touch    Assessment/Plan: 1. 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 Physiatrist is providing close team supervision and 24 hour management of active medical problems listed below. Physiatrist and rehab team continue to assess barriers to discharge/monitor patient progress toward functional and medical goals. FIM: Function - Bathing Position: Sitting  EOB Body parts bathed by patient: Right arm, Left arm, Chest, Abdomen, Right upper leg, Left upper leg Body parts bathed by helper: Back, Left lower leg, Right lower leg Bathing not applicable: Front perineal area, Buttocks (RN just cleaned and changed dressing on buttocks) Assist Level: 2 helpers  Function- Upper Body Dressing/Undressing What is the patient wearing?: Pull over shirt/dress Pull over shirt/dress - Perfomed by helper: Thread/unthread right sleeve, Thread/unthread left sleeve, Put head through opening, Pull shirt over trunk Assist Level: 2 helpers Function - Lower   Body Dressing/Undressing What is the patient wearing?: Non-skid slipper socks Non-skid slipper socks- Performed by helper: Don/doff right sock, Don/doff left sock Assist for footwear: Dependant Assist for lower body dressing: 2 Helpers  Function - Toileting Toileting activity did not occur: Safety/medical concerns (ileostomy/foley)  Function - Air cabin crew transfer activity did not occur: Safety/medical concerns (ileostomy/foley)  Function - Chair/bed transfer Chair/bed transfer activity did not occur: Safety/medical concerns  Function - Locomotion: Wheelchair Will patient use wheelchair at discharge?: Yes Wheelchair activity did not occur: Safety/medical concerns Wheel 50 feet with 2 turns activity did not occur: Safety/medical concerns Wheel 150 feet activity did not occur: Safety/medical concerns Function - Locomotion: Ambulation Ambulation activity did not occur: Safety/medical concerns Walk 10 feet activity did not occur: Safety/medical concerns Walk 50 feet with 2 turns activity did not occur: Safety/medical concerns Walk 150 feet activity did not occur: Safety/medical concerns Walk 10 feet on uneven surfaces activity did not occur: Safety/medical concerns  Function - Comprehension Comprehension: Auditory Comprehension assist level: Understands basic 90% of the time/cues < 10% of the  time  Function - Expression Expression: Verbal Expression assistive device: Talk trach valve Expression assist level: Expresses basic needs/ideas: With extra time/assistive device  Function - Social Interaction Social Interaction assist level: Interacts appropriately 90% of the time - Needs monitoring or encouragement for participation or interaction.  Function - Problem Solving Problem solving assist level: Solves basic 50 - 74% of the time/requires cueing 25 - 49% of the time  Function - Memory Memory assist level: Recognizes or recalls 50 - 74% of the time/requires cueing 25 - 49% of the time Patient normally able to recall (first 3 days only): Current season, Location of own room, Staff names and faces, That he or she is in a hospital  1. 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 the patient has severe quadriparesis 2. LLE DVT treatment /Anticoagulation: Pharmaceutical: Coumadin 3. Pain Management: Tylenol for prn for pain. Will continue oxycodone prn if needed for sever pain. 4. Mood: LCSW to follow for evaluation and support.  5. Neuropsych: This patient is capable of making decisions on his own behalf. 6. Skin/Wound Care: Will consult WOC for input on sacral wound as well as multiple healing ulcers. Will add Vit C and Zinc to help promote healing.general surgery for sacral wound debridement, PR AFO ordered for heels, await air bed which has been ordered  7. Fluids/Electrolytes/Nutrition: Monitor I/O. Will consult dietician to help with food choices. Increase prosource to TID. Added ensure between meals as unable to tolerate a lot of food at one time.  8. Abdominal abscess: Continue drain care bid. On Daptomycin --question duration. Flagyl to contiue till 10/26 per records.  9. Hypokalemia: Improved with supplement in past 24 hours.still requires  additional oral potassium and magnesium supplementation based on a.m. labs  10. VDRF/ History of OSA: To continue current trach till mobility improves per CCM. Tolerating humidified air. We will need to reconsult pulmonary before decannulation 11. Anxiety disorder: Will resume low dose Xanax as used this for insomnia at home. given his respiratory compromise will need to limit dosing 12. Anemia: Likely due to infection/chronic illness. Check CBC in am.  13. Anasarca: Improved with diuresis. Continue Lasix.  14. Diastolic CHF: Off amiodarone, Ace and statin at this time due to recurrent hypotension and multiple medical issues. Monitor for signs of overload. Check daily weights. Monitor BP bid.  LOS (Days) 1 A FACE TO FACE EVALUATION WAS PERFORMED  Giovanna Kemmerer E 08/28/2015, 5:09 PM

## 2015-08-28 NOTE — Consult Note (Addendum)
WOC wound consult note Reason for Consult: Consult requested for multiple wounds.  WOC team has followed in the past for assessment and plan of care prior to transfer to rehab.  Refer to consult note on 9/9.  Sacrum wound is now unstageable and [facility] team was consulted to perform bedside I&D.  They have ordered Santyl and physical therapy to perform hydrotherapy to assist with removal of nonviable tissue.  Wound care orders have been provided for bedside nurses to other locations.  Please refer to [facility] team for further questions. Please re-consult if further assistance is needed.  Thank-you,  Cammie Mcgeeawn Lazarius Rivkin MSN, RN, CWOCN, [place], CNS [phone]

## 2015-08-28 NOTE — Evaluation (Signed)
Occupational Therapy Assessment and Plan  Patient Details  Name: Jonathan Le MRN: [id number] Date of Birth: 09-22-1946  OT Diagnosis: abnormal posture, acute pain, cognitive deficits, muscular wasting and disuse atrophy and muscle weakness (generalized) Rehab Potential: Rehab Potential (ACUTE ONLY): Fair ELOS: 4 weeks   Today's Date: 08/28/2015 OT Individual Time: 7342-8768 OT Individual Time Calculation (min): 87 min     Problem List:  Patient Active Problem List   Diagnosis Date Noted  . Intra-abdominal abscess ([place])   . Debility 08/27/2015  . Acute on chronic respiratory failure, unspecified whether with hypoxia or hypercapnia ([place])   . Encounter for orogastric (OG) tube placement   . Abdominal abscess ([place])   . Abscess   . Encounter for central line placement   . Encounter for intubation   . Respiratory failure ([place])   . Septic shock ([place])   . Blood poisoning ([place])   . Tracheostomy status ([place])   . Acute respiratory failure ([place])   . Pressure ulcer 06/21/2015  . Shock ([place]) 06/18/2015  . Abdominal pain, generalized   . Shortness of breath   . Hypoxia   . 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 Cardiomyopathy, ischemic 05/21/2015  . CAD, multiple vessel 05/21/2015  . Acute systolic congestive heart failure, NYHA class 3 ([place]) 05/21/2015  . Morbid obesity ([place]) 05/20/2015  . OSA on CPAP 05/20/2015  . Other specified hypotension   . NSTEMI (non-ST elevated myocardial infarction) ([place]) 05/19/2015  . Benign prostatic hyperplasia with urinary obstruction 04/24/2015  . Chronic LBP 12/05/2013  . Essential hypertension 09/04/2013    Past Medical History:  Past Medical History  Diagnosis Date  . Anxiety   . Hypertension   . Arthritis   . Sleep apnea     wears CPAP nightly  . Glaucoma   . CHF (congestive heart failure) (H[facility])     MI, with open heart surgery x 30 sdays ago per pt. (06/26/15)  . S/P CABG (coronary artery bypass graft)    Past Surgical History:  Past Surgical  History  Procedure Laterality Date  . Hernia repair      right  . Eye surgery      bil cataract  . Wisdom tooth extraction    . Fasciotomy foot / toe Left     foot  . Cardiac catheterization N/A 05/20/2015    Procedure: Left Heart Cath and Coronary Angiography;  Surgeon: Burnell Blanks, MD;  Location: [place] [facility];  Service: Cardiovascular;  Laterality: N/A;  . Coronary artery bypass graft N/A 05/22/2015    Procedure: CORONARY ARTERY BYPASS GRAFTING (CABG), ON PUMP, TIMES THREE, USING LEFT INTERNAL MAMMARY ARTERY, RIGHT GREATER SAPHENOUS VEIN HARVESTED ENDOSCOPICALLY;  Surgeon: Ivin Poot, MD;  Location: [place] [facility];  Service: Open Heart Surgery;  Laterality: N/A;  LIMA-LAD, SVG-OM, SVG-PD  . Tee without cardioversion N/A 05/22/2015    Procedure: TRANSESOPHAGEAL ECHOCARDIOGRAM (TEE);  Surgeon: Ivin Poot, MD;  Location: [place];  Service: Open Heart Surgery;  Laterality: N/A;  . Laparotomy N/A 06/18/2015    Procedure: EXPLORATORY LAPAROTOMY;  Surgeon: Rolm Bookbinder, MD;  Location: [place];  Service: General;  Laterality: N/A;  . Laparotomy N/A 06/20/2015    Procedure: WASHOUT OF ABDOMEN, CLOSURE OF ABDOMINAL WALL WITH PLACEMENT OF WOUND VAC AND ILLEOSTOMY ;  Surgeon: Stark Klein, MD;  Location: [facility];  Service: General;  Laterality: N/A;  . Tracheostomy tube placement N/A 07/03/2015    Procedure: TRACHEOSTOMY;  Surgeon: Izora Gala, MD;  Location:  [facility];  Service: ENT;  Laterality: N/A;    Assessment & Plan Clinical Impression: Patient is a 69 y.o. with history of HTN, OSA, morbid obesity, anxiety disorder, CAD (s/p CABG 05/22/2015 with post of A fib and RLE cellulitis) who was originally admitted on 06/19/15 with severe abdominal pain with respiratory failure and sepsis due to ischemic bowel. He required total colectomy with ileostomy by Dr. Barry Dienes and self extubated on 08/08 but developed respiratory distress with confusion requiring eventual intubation. Trach placed on 08/16 by Dr.  Constance Holster and patient extubated to [facility] and discharged to [facility] on 08/24. Patient self de-cannulated at some point and had aspiration event with respiratory failure requiring transfer back to acute hospital on 07/20/15. He developed septic shock and was found to have bilateral intra-abdominal abscess and drains placed in left hemipelvis and RLE on 09/06 by Dr. Barbie Banner. He developed respiratory failure requiring re-intubation and trach 09/08. Culture from left drain positive for enterococcus and antibiotics changed to Vancomycin and antidulafungin for coverage. He has had drainage from rectal stump likely due to stump breakdown with peritoneal fluid leakage per [facility]. 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   He was transferred back to [facility] on 07/25/15 for vent wean and has been extubated to [facility]. He was downsized to XLT cuffed #6 with recommendations not to downsize trach until improvement he has made significant rehab progress" on 09/22 and is tolerating PMSV. Follow up CT abdomen 9/28 revealed enlarging large abscess 1-2 abscesses along left lateral wall. Aspiration drainage performed with placement of 2 new drains by Dr. Anselm Pancoast. Metabolic encephalopathy has resolved. He has had significant edema BLE and developed fevers and BLE dopplers 9/21 was positive for Left popliteal DVT and therefore coumadin added for treatment. Anasarca treated with IV diuretics with improvement and patient currently on po lasix with 1400 cc FR. Hypokalemia has improved with potassium and magnesium supplementation. Abdominal incision closing in well and multiple ulcers--bilateral heels, left thigh, and trach sites clean and dry. Stage III Sacrococcygeal wound -debrided at bedside on 10/05 and currently reported to be 15 X 15 with 1.8 cm deep .   He has been advanced to regular diet but intake is 30-50% due to dysgeusia. Activity tolerance has improved and he is making  progress in therapy. He has met maximum benefit from [facility] and CIR was recommended by MD and Rehab team for follow up therapy. .  Patient transferred to CIR on 08/27/2015 .    Patient currently requires total assistance of 2 helpers with basic self-care skills secondary to muscle weakness, decreased cardiorespiratoy endurance and decreased oxygen support, decreased awareness, decreased problem solving and decreased memory and decreased sitting balance, decreased standing balance, decreased postural control and decreased balance strategies.  Prior to hospitalization, patient could complete ADLs with modified independent .  Patient will benefit from skilled intervention to decrease level of assist with basic self-care skills prior to discharge home with care partner.  Anticipate patient will require minimal physical assistance and follow up home health.  OT - End of Session Activity Tolerance: Tolerates 30+ min activity with multiple rests Endurance Deficit: Yes Endurance Deficit Description: requires multiple rest breaks, Anxiety effects participation OT Assessment Rehab Potential (ACUTE ONLY): Fair OT Patient demonstrates impairments in the following area(s): Balance;Cognition;Edema;Endurance;Motor;Pain;Safety;Skin Integrity OT Basic ADL's Functional Problem(s): Grooming;Bathing;Dressing;Toileting OT Transfers Functional Problem(s): Toilet;Tub/Shower OT Additional Impairment(s): None OT Plan OT Intensity: Minimum of 1-2 x/day, 45 to 90 minutes  OT Frequency: Total of 15 hours over 7 days of combined therapies OT Duration/Estimated Length of Stay: 4 weeks OT Treatment/Interventions: Balance/vestibular training;Cognitive remediation/compensation;Discharge planning;Disease mangement/prevention;DME/adaptive equipment instruction;Functional mobility training;Neuromuscular re-education;Pain management;Patient/family education;Psychosocial support;Self Care/advanced ADL retraining;Skin care/wound  managment;Splinting/orthotics;Therapeutic Activities;Therapeutic Exercise;UE/LE Strength taining/ROM;UE/LE Coordination activities;Wheelchair propulsion/positioning OT Self Feeding Anticipated Outcome(s): No goal OT Basic Self-Care Anticipated Outcome(s): Min assist OT Toileting Anticipated Outcome(s): Min assist OT Bathroom Transfers Anticipated Outcome(s): Mod assist OT Recommendation Recommendations for Other Services: Neuropsych consult Patient destination: Home Follow Up Recommendations: Home health OT;24 hour supervision/assistance Equipment Recommended: 3 in 1 bedside comode;To be determined   Skilled Therapeutic Intervention OT eval completed with discussion of rehab process, OT purpose, POC, goals, and ELOS.  ADL assessment completed seated at EOB with pt requiring mod assist for sitting balance due to lean to Rt in unsupported sitting and decreased tolerance progressing to contact guard with dynamic sitting.  Pt with decreased problem solving and orientation when attempting to don shirt, anxiety playing a part in participation and ability to fully complete tasks.  Attempted sit > stand with 3 musketeer method with pt demonstrating decreased anterior weight shift and fearfulness when attempting to lift buttocks - unable to even lift buttocks from elevated EOB.  Pt tolerated sitting EOB during bathing and UB dressing for approx 45 min demonstrating improving sitting balance.  Pt requires +2 for all rolling and dynamic sitting tasks due to decreased tolerance and anxiety as well as BUE weakness.  OT Evaluation Precautions/Restrictions  Precautions Precautions: Fall Restrictions Weight Bearing Restrictions: No General   Vital Signs Therapy Vitals Pulse Rate: (!) 107 Resp: 18 Oxygen Therapy SpO2: 93 % O2 Device: Tracheostomy Collar O2 Flow Rate (L/min): 5 L/min FiO2 (%): 28 % Pain Pain Assessment Pain Assessment: No/denies pain Home Living/Prior Functioning Home  Living Available Help at Discharge: Family, Available 24 hours/day Type of Home: House Home Access: Stairs to enter [employer] of Steps: 1 in from gargage, 3-4 at front entrance Home Layout: Two level, 1/2 bath on main level Bathroom Shower/Tub: Gaffer, Door [employer] Toilet: Standard Bathroom Accessibility: Yes Additional Comments: Pt reports having shower seat, but no grab bars  Lives With: Spouse Prior Function Level of Independence: Independent with basic ADLs, Independent with homemaking with ambulation, Requires assistive device for independence  Able to Take Stairs?: Yes Driving: Yes Comments: using rollator; mostly limited by back and knee pain; denied need for assist with ADLs; reports was no longer receiveing HH therapies ADL   Vision/Perception  Vision- History Baseline Vision/History: Wears glasses Wears Glasses: Reading only Patient Visual Report: No change from baseline Vision- Assessment Vision Assessment?: No apparent visual deficits Perception Comments: WNL  Cognition Overall Cognitive Status: Impaired/Different from baseline Arousal/Alertness: Awake/alert Orientation Level: Person;Place;Situation Person: Oriented Place: Oriented Situation: Oriented Year: 2016 Month: October Day of Week: Incorrect (stated thursday, when it is friday) Memory: Impaired Memory Impairment: Decreased recall of new information;Decreased short term memory Decreased Short Term Memory: Verbal complex Immediate Memory Recall: Sock;Blue;Bed Memory Recall: Sock;Blue;Bed Memory Recall Sock: Without Cue Memory Recall Blue: Without Cue Memory Recall Bed: With Cue Attention: Selective Selective Attention: Appears intact Awareness Impairment: Emergent impairment;Anticipatory impairment Problem Solving: Impaired Problem Solving Impairment: Functional complex;Functional basic Executive Function: Organizing;Self Monitoring;Self Correcting Organizing:  Impaired Organizing Impairment: Functional complex Self Monitoring: Impaired Self Monitoring Impairment: Functional complex Self Correcting: Impaired Self Correcting Impairment: Functional complex Behaviors: Other (comment) (anxious) Safety/Judgment: Appears intact Sensation Sensation Light Touch: Appears Intact Stereognosis: Not tested Hot/Cold: Not tested Proprioception: Appears Intact Motor  Mobility     Trunk/Postural Assessment     Balance Dynamic Sitting Balance Sitting balance - Comments: Able to maintain upright sitting posture and pick up feet while holding onto bed rail Extremity/Trunk Assessment RUE Assessment RUE Assessment: Exceptions to [facility] (generalized weakness overall, with loose gross grasp) LUE Assessment LUE Assessment: Exceptions to [facility] (generalized weakness overall, with loose gross grasp)   See Function Navigator for Current Functional Status.   Refer to Care Plan for Long Term Goals  Recommendations for other services: Neuropsych  Discharge Criteria: Patient will be discharged from OT if patient refuses treatment 3 consecutive times without medical reason, if treatment goals not met, if there is a change in medical status, if patient makes no progress towards goals or if patient is discharged from hospital.  The above assessment, treatment plan, treatment alternatives and goals were discussed and mutually agreed upon: by patient  Simonne Come 08/28/2015, 12:25 PM

## 2015-08-28 NOTE — Evaluation (Signed)
Physical Therapy Assessment and Plan  Patient Details  Name: Jonathan Le MRN: [id number] Date of Birth: 29-Jun-1946  PT Diagnosis: Abnormal posture, Abnormality of gait, Difficulty walking, Edema, Impaired sensation, Low back pain, Muscle weakness and Pain in B knees Rehab Potential: Fair ELOS: 4 weeks   Today's Date: 08/28/2015 PT Individual Time: 1305-1400 PT Individual Time Calculation (min): 55 min    Problem List:  Patient Active Problem List   Diagnosis Date Noted  . Intra-abdominal abscess ([place])   . Debility 08/27/2015  . Acute on chronic respiratory failure, unspecified whether with hypoxia or hypercapnia ([place])   . Encounter for orogastric (OG) tube placement   . Abdominal abscess ([place])   . Abscess   . Encounter for central line placement   . Encounter for intubation   . Respiratory failure ([place])   . Septic shock ([place])   . Blood poisoning ([place])   . Tracheostomy status ([place])   . Acute respiratory failure ([place])   . Pressure ulcer 06/21/2015  . Shock ([place]) 06/18/2015  . Abdominal pain, generalized   . Shortness of breath   . Hypoxia   . 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 Cardiomyopathy, ischemic 05/21/2015  . CAD, multiple vessel 05/21/2015  . Acute systolic congestive heart failure, NYHA class 3 ([place]) 05/21/2015  . Morbid obesity ([place]) 05/20/2015  . OSA on CPAP 05/20/2015  . Other specified hypotension   . NSTEMI (non-ST elevated myocardial infarction) ([place]) 05/19/2015  . Benign prostatic hyperplasia with urinary obstruction 04/24/2015  . Chronic LBP 12/05/2013  . Essential hypertension 09/04/2013    Past Medical History:  Past Medical History  Diagnosis Date  . Anxiety   . Hypertension   . Arthritis   . Sleep apnea     wears CPAP nightly  . Glaucoma   . CHF (congestive heart failure) (HCC)     MI, with open heart surgery x 30 sdays ago per pt. (06/26/15)  . S/P CABG (coronary artery bypass graft)    Past Surgical History:  Past Surgical History   Procedure Laterality Date  . Hernia repair      right  . Eye surgery      bil cataract  . Wisdom tooth extraction    . Fasciotomy foot / toe Left     foot  . Cardiac catheterization N/A 05/20/2015    Procedure: Left Heart Cath and Coronary Angiography;  Surgeon: Burnell Blanks, MD;  Location: [place] [facility];  Service: Cardiovascular;  Laterality: N/A;  . Coronary artery bypass graft N/A 05/22/2015    Procedure: CORONARY ARTERY BYPASS GRAFTING (CABG), ON PUMP, TIMES THREE, USING LEFT INTERNAL MAMMARY ARTERY, RIGHT GREATER SAPHENOUS VEIN HARVESTED ENDOSCOPICALLY;  Surgeon: Ivin Poot, MD;  Location: [facility];  Service: Open Heart Surgery;  Laterality: N/A;  LIMA-LAD, SVG-OM, SVG-PD  . Tee without cardioversion N/A 05/22/2015    Procedure: TRANSESOPHAGEAL ECHOCARDIOGRAM (TEE);  Surgeon: Ivin Poot, MD;  Location: [facility];  Service: Open Heart Surgery;  Laterality: N/A;  . Laparotomy N/A 06/18/2015    Procedure: EXPLORATORY LAPAROTOMY;  Surgeon: Rolm Bookbinder, MD;  Location: [facility];  Service: General;  Laterality: N/A;  . Laparotomy N/A 06/20/2015    Procedure: WASHOUT OF ABDOMEN, CLOSURE OF ABDOMINAL WALL WITH PLACEMENT OF WOUND VAC AND ILLEOSTOMY ;  Surgeon: Stark Klein, MD;  Location: [facility];  Service: General;  Laterality: N/A;  . Tracheostomy tube placement N/A 07/03/2015    Procedure: TRACHEOSTOMY;  Surgeon: Izora Gala, MD;  Location:  [facility];  Service: ENT;  Laterality: N/A;    Assessment & Plan Clinical Impression: Jonathan Le is a 69 year old with history of HTN, OSA, morbid obesity, anxiety disorder, CAD (s/p CABG 05/22/2015 with post of A fib and RLE cellulitis) who was originally admitted on 06/19/15 with severe abdominal pain with respiratory failure and sepsis due to ischemic bowel. He required total colectomy with ileostomy by Dr. Barry Dienes and self extubated on 08/08 but developed respiratory distress with confusion requiring eventual intubation. Trach placed on 08/16 by  Dr. Constance Holster and patient extubated to [facility] and discharged to [facility] on 08/24. Patient self de-cannulated at some point and had aspiration event with respiratory failure requiring transfer back to acute hospital on 07/20/15. He developed septic shock and was found to have bilateral intra-abdominal abscess and drains placed in left hemipelvis and RLE on 09/06 by Dr. Barbie Banner. He developed respiratory failure requiring re-intubation and trach 09/08. Culture from left drain positive for enterococcus and antibiotics changed to Vancomycin and antidulafungin for coverage. He has had drainage from rectal stump likely due to stump breakdown with peritoneal fluid leakage per CCS. 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   He was transferred back to [facility] on 07/25/15 for vent wean and has been extubated to [facility]. He was downsized to XLT cuffed #6 with recommendations not to downsize trach until improvement he has made significant rehab progress" on 09/22 and is tolerating PMSV. Follow up CT abdomen 9/28 revealed enlarging large abscess 1-2 abscesses along left lateral wall. Aspiration drainage performed with placement of 2 new drains by Dr. Anselm Pancoast. Metabolic encephalopathy has resolved. He has had significant edema BLE and developed fevers and BLE dopplers 9/21 was positive for Left popliteal DVT and therefore coumadin added for treatment. Anasarca treated with IV diuretics with improvement and patient currently on po lasix with 1400 cc FR. Hypokalemia has improved with potassium and magnesium supplementation. Abdominal incision closing in well and multiple ulcers--bilateral heels, left thigh, and trach sites clean and dry. Stage III Sacrococcygeal wound -debrided at bedside on 10/05 and currently reported to be 15 X 15 with 1.8 cm deep .   He has been advanced to regular diet but intake is 30-50% due to dysgeusia. Activity tolerance has improved and he is making  progress in therapy. He has met maximum benefit from [facility] and CIR was recommended by MD and Rehab team for follow up therapy. Patient was cleared for admission today.  Patient transferred to CIR on 08/27/2015 .   Patient currently requires maxA +2 with mobility secondary to muscle weakness, decreased cardiorespiratoy endurance and decreased oxygen support and decreased sitting balance, decreased standing balance, decreased postural control and decreased balance strategies.  Prior to hospitalization, patient was modified independent  with mobility and lived with Spouse in a House home.  Home access is 1 in from gargage but have mobile ramp available, 2-3 at front entranceStairs to enter.  Patient will benefit from skilled PT intervention to maximize safe functional mobility, minimize fall risk and decrease caregiver burden for planned discharge home with 24 hour assist.  Anticipate patient will benefit from follow up [facility] at discharge.  PT - End of Session Activity Tolerance: Tolerates 30+ min activity with multiple rests Endurance Deficit: Yes Endurance Deficit Description: Requires several rest breaks, sitting tolerance <22mn due to shortness of breath/anxiety, O2 drops with bed mobility PT Assessment Rehab Potential (ACUTE/IP ONLY): Fair Barriers to Discharge: [place]  [place] PT Patient demonstrates impairments in the following area(s): Balance;Pain;Edema;Safety;Sensory;Endurance;Motor;Skin Integrity PT Transfers Functional Problem(s): Bed Mobility;Bed to Chair;Car PT Locomotion Functional Problem(s): Ambulation;Wheelchair Mobility PT Plan PT Intensity: Minimum of 1-2 x/day ,45 to 90 minutes PT Frequency: Total of 15 hours over 7 days of combined therapies PT Duration Estimated Length of Stay: 4 weeks PT Treatment/Interventions: Ambulation/gait training;Balance/vestibular training;Discharge planning;Disease management/prevention;DME/adaptive equipment instruction;Functional  mobility training;Neuromuscular re-education;Patient/family education;Pain management;Psychosocial support;Stair training;Skin care/wound management;Splinting/orthotics;Therapeutic Activities;Therapeutic Exercise;UE/LE Coordination activities;UE/LE Strength taining/ROM;Wheelchair propulsion/positioning PT Transfers Anticipated Outcome(s): modA  PT Locomotion Anticipated Outcome(s): gait maxA with therapy, supervision w/c home environment, minA community PT Recommendation Recommendations for Other Services: Neuropsych consult Follow Up Recommendations: Home health PT Patient destination: Home Equipment Recommended: To be determined  Skilled Therapeutic Intervention Pt received supine in bed with no c/o pain and agreeable to treatment. Initial PT evaluation performed and completed. Assessed rolling R/L with HOB elevated and bedrails with maxA +2 to assist with donning shorts and placing sling for maximove. With rolling R/L O2 sats dropped to 84% one time, and dropped to low 90's several times during bed mobility, however returned to WNL within 5-10 seconds with pt resting and cues for deep breathing. Pt transferred from standard bed to air mattress bed with maximove. Supine>sit maxA +2 and pt using bedrail with HOB elevated. Sitting balance on EOB standbyA with RUE supported on bedrail. Assessed LE strength with pt in sitting, and pt able to maintain dynamic sitting balance with RUE support and standbyA. After sitting approximately 1 min, pt reports he feels short of breath, however O2 monitor reports 96%. Pt impulsively returns to R sidelying and apologizes for not being able to sit longer, requests to return to supine position due to extreme fatigue. Pt repositioned in bed with +2 A and wedge under R side per pt request, and remained supine in bed for remainder of session. Educated pt and family (sister and brother in law) on rehab process, daily schedule, goals and estimated length of stay; pt and family  agreeable to all the above.   PT Evaluation Precautions/Restrictions Precautions Precautions: Fall Restrictions Weight Bearing Restrictions: No Other Position/Activity Restrictions: trach collar General Chart Reviewed: Yes Response to Previous Treatment: Patient reporting fatigue but able to participate. Family/Caregiver Present: Yes  Pain Pain Assessment Pain Assessment: No/denies pain Home Living/Prior Functioning Home Living Available Help at Discharge: Family;Available 24 hours/day Type of Home: House Home Access: Stairs to enter [employer] of Steps: 1 in from gargage but have mobile ramp available, 2-3 at front entrance Entrance Stairs-Rails: None Home Layout: Two level;1/2 bath on main level;Bed/bath upstairs Alternate Level Stairs-Number of Steps: 12-14 Alternate Level Stairs-Rails: Right (full R rail, half L rail) Bathroom Shower/Tub: Walk-in shower;Door [employer] Toilet: Standard Bathroom Accessibility: Yes Additional Comments: Pt reports having shower seat, but no grab bars  Lives With: Spouse Prior Function Level of Independence: Independent with basic ADLs;Independent with homemaking with ambulation;Requires assistive device for independence  Able to Take Stairs?: Yes Driving: Yes Vocation: Retired Comments: using rollator; mostly limited by back and knee pain; denied need for assist with ADLs; reports was no longer receiveing HH therapies Vision/Perception  Perception Comments: WNL  Cognition Overall Cognitive Status: Impaired/Different from baseline Arousal/Alertness: Awake/alert Orientation Level: Oriented X4 Attention: Selective Selective Attention: Appears intact Memory: Impaired Memory Impairment: Decreased recall of new information;Decreased short term memory Decreased Short Term Memory: Verbal complex Awareness Impairment: Emergent impairment;Anticipatory impairment Problem Solving: Impaired Problem Solving Impairment: Functional  complex;Functional basic Behaviors: Other (comment) (anxious) Safety/Judgment: Appears intact  Sensation Sensation Light Touch: Appears Intact Stereognosis: Not tested Hot/Cold: Not tested Proprioception: Impaired by gross assessment (absent BLE hallux) Coordination Gross Motor Movements are Fluid and Coordinated: No (limited by strength impairments) Heel Shin Test: NT Motor  Motor Motor: Abnormal postural alignment and control Motor - Skilled Clinical Observations: generalized weakness, poor postural control, abnormal postural alignment  Mobility Bed Mobility Bed Mobility: Rolling Right;Rolling Left;Sit to Supine;Supine to Sit Rolling Right: With rail;1: +2 Total assist Rolling Right: Patient Percentage: 20% Rolling Right Details: Tactile cues for placement;Tactile cues for weight shifting;Verbal cues for technique;Visual cues/gestures for sequencing Rolling Left: 1: +2 Total assist Rolling Left: Patient Percentage: 20% Rolling Left Details: Tactile cues for weight shifting;Tactile cues for placement;Visual cues/gestures for sequencing;Verbal cues for technique Supine to Sit: 1: +2 Total assist;With rails;HOB elevated Supine to Sit: Patient Percentage: 30% Supine to Sit Details: Verbal cues for technique;Tactile cues for weight shifting;Tactile cues for placement;Verbal cues for sequencing;Verbal cues for precautions/safety;Manual facilitation for weight shifting Sit to Supine: 1: +2 Total assist Sit to Supine: Patient Percentage: 30% Sit to Supine - Details: Manual facilitation for weight shifting;Verbal cues for sequencing;Verbal cues for technique;Tactile cues for weight shifting;Tactile cues for sequencing Transfers Transfers: Yes Transfer via Lift Equipment: Maximove Locomotion  Ambulation Ambulation: No Gait Gait: No Stairs / Additional Locomotion Stairs: No Wheelchair Mobility Wheelchair Mobility: No  Trunk/Postural Assessment  Cervical Assessment Cervical  Assessment: Within Functional Limits Thoracic Assessment Thoracic Assessment: Exceptions to [facility] Lumbar Assessment Lumbar Assessment: Exceptions to [facility] (difficult to assess d/t body habitus and poor sitting tolerance) Postural Control Postural Control: Deficits on evaluation (decreased righting reactions, requires use of UEs to maintain postural control)  Balance Balance Balance Assessed: Yes Static Sitting Balance Static Sitting - Balance Support: Feet supported;Right upper extremity supported Static Sitting - Level of Assistance: 5: Stand by assistance Static Sitting - Comment/# of Minutes: x1 min before pt reporting SOB and returned to sidelying position impulsively Dynamic Sitting Balance Dynamic Sitting - Balance Support: Right upper extremity supported;Feet supported;During functional activity Dynamic Sitting - Level of Assistance: 5: Stand by assistance Sitting balance - Comments: Performed BLE MMT with RUE supported on bedrail Extremity Assessment  RUE Assessment RUE Assessment: Exceptions to [facility] (generalized weakness, ROM WFL) LUE Assessment LUE Assessment: Exceptions to [facility] (generalized weakness, ROM WFL) RLE Assessment RLE Assessment: Exceptions to [facility] (ROM limited by edema/pain, grossly 3+/5 to 4-/5 throughout, ankle PF/DF 2/5) LLE Assessment LLE Assessment: Exceptions to WFL (ROM limited by pain/edema, hip/knees grossly 3+/5 to 4-/5, ankle DF/PF 2/5)   See Function Navigator for Current Functional Status.   Refer to Care Plan for Long Term Goals  Recommendations for other services: Neuropsych  Discharge Criteria: Patient will be discharged from PT if patient refuses treatment 3 consecutive times without medical reason, if treatment goals not met, if there is a change in medical status, if patient makes no progress towards goals or if patient is discharged from hospital.  The above assessment, treatment plan, treatment alternatives and goals were discussed and  mutually agreed upon: by patient and by family  Luberta Mutter 08/28/2015, 2:30 PM

## 2015-08-28 NOTE — Progress Notes (Signed)
Patient noted to have increased confusion this pm. Patient required reorientation and  Requested RN to lower side rail on overlay mattress bed. RN attempted to reorient patient.  JP drains draining thick yellow drainage. Right percutaneous drain noted with scant drainage. O2 sat at 91-95 % on o2 at 28% trach collar. #6 XLT trach cuffless [place]. Right heel blister burst oozing serous sanguinous drainage. Foam dressing applied to right heel. Dressing changed to sacral wound by PA, Barnetta ChapelKelly Osborne. Continue with plan of care.  Jonathan Le, Jonathan Le

## 2015-08-28 NOTE — Plan of Care (Signed)
Problem: RH SKIN INTEGRITY Goal: RH STG MAINTAIN SKIN INTEGRITY WITH ASSISTANCE STG Maintain Skin Integrity With total Assistance. Outcome: Progressing Turning patient frequently during night, with use of wedge and pillows.

## 2015-08-28 NOTE — Progress Notes (Signed)
Initial Nutrition Assessment  DOCUMENTATION CODES:   Obesity unspecified  INTERVENTION:   Continue Ensure Enlive po BID, each supplement provides 350 kcal and 20 grams of protein.  Continue 30 ml Prostat po TID, each supplement provides 100 kcal and 15 grams of protein.   Encourage adequate PO intake.   NUTRITION DIAGNOSIS:   Increased nutrient needs related to wound healing as evidenced by estimated needs.  GOAL:   Patient will meet greater than or equal to 90% of their needs  MONITOR:   PO intake, Weight trends, Supplement acceptance, Labs, I & O's, Skin  REASON FOR ASSESSMENT:   Consult Wound healing  ASSESSMENT:   69 year old with history of HTN, OSA, morbid obesity, anxiety disorder, CAD (s/p CABG 05/22/2015 with post of A fib and RLE cellulitis) who was originally admitted on 06/19/15 with severe abdominal pain with respiratory failure and sepsis due to ischemic bowel. He required total colectomy with ileostomy Abdominal incision closing in well and multiple ulcers--bilateral heels, left thigh, and trach sites clean and dry. Stage III Sacrococcygeal wound -debrided at bedside on 10/05 and currently reported to be 15 X 15 with 1.8 cm deep .  Pt reports having a decreased appetite. Meal completion has been 25%. Noted per Epic weight records, pt with a 11.8% weight loss in 3 months. RD to monitor closely. Pt with multiple wounds. Pt currently has Ensure and prostat ordered and has been consuming them. RD to continue with current orders. Pt was additionally offered nourishment snacks, however refused. Pt was encouraged to eat his food at meals and to drink his supplements. Pt with no observed significant fat or muscle mass loss.   Labs and medications reviewed.   Diet Order:  Diet regular Room service appropriate?: Yes; Fluid consistency:: Thin; Fluid restriction:: 1500 mL Fluid  Skin:  Wound (see comment) (Unstageable ulcer on L heel, R coccyx, stage 3 L buttocks)  Last  BM:  10/13  Height:   Ht Readings from Last 1 Encounters:  08/27/15 5\' 9"  (1.753 m)    Weight:   Wt Readings from Last 1 Encounters:  08/27/15 268 lb 1.3 oz (121.6 kg)    Ideal Body Weight:  72.7 kg  BMI:  Body mass index is 39.57 kg/(m^2).  Estimated Nutritional Needs:   Kcal:  2200-2400  Protein:  135-150 grams  Fluid:  2.2 - 2.4 L/day  EDUCATION NEEDS:   No education needs identified at this time  Roslyn SmilingStephanie Marqus Macphee, MS, RD, LDN Pager # [phone] After hours/ weekend pager # [phone]

## 2015-08-28 NOTE — Progress Notes (Signed)
Patient had high levels of anxiety last pm per nursing and was disoriented.  He does report that he did sleep better last night. We discussed need for his antibiotics and that these were what he was getting on select. He needs to continue this to help treat and prevent occurrence of other pockets of infection. He was agreeable to taking both Merrem and Flagyl after discussion.   --Spoke with Kelly/CCS PA regarding need for input on antibiotics as well as evaluation of sacral wound as this likely needs debridement.   --Contacted Beckey DowningPam Turpin PA for IR to update them on room change. They will follow at a distance and give input as needed.

## 2015-08-29 ENCOUNTER — Inpatient Hospital Stay (HOSPITAL_COMMUNITY): Payer: Medicare Other | Admitting: Speech Pathology

## 2015-08-29 ENCOUNTER — Inpatient Hospital Stay (HOSPITAL_COMMUNITY): Payer: Medicare Other | Admitting: Occupational Therapy

## 2015-08-29 ENCOUNTER — Inpatient Hospital Stay (HOSPITAL_COMMUNITY): Payer: Medicare Other | Admitting: Physical Therapy

## 2015-08-29 DIAGNOSIS — G7281 Critical illness myopathy: Secondary | ICD-10-CM | POA: Diagnosis present

## 2015-08-29 DIAGNOSIS — K651 Peritoneal abscess: Secondary | ICD-10-CM

## 2015-08-29 DIAGNOSIS — R5381 Other malaise: Secondary | ICD-10-CM

## 2015-08-29 DIAGNOSIS — G6281 Critical illness polyneuropathy: Secondary | ICD-10-CM | POA: Diagnosis present

## 2015-08-29 LAB — BASIC METABOLIC PANEL
ANION GAP: 7 (ref 5–15)
BUN: 9 mg/dL (ref 6–20)
CALCIUM: 8 mg/dL — AB (ref 8.9–10.3)
CHLORIDE: 97 mmol/L — AB (ref 101–111)
CO2: 31 mmol/L (ref 22–32)
CREATININE: 0.35 mg/dL — AB (ref 0.61–1.24)
GFR calc non Af Amer: >60 mL/min (ref >60)
Glucose, Bld: 99 mg/dL (ref 65–99)
POTASSIUM: 3.6 mmol/L (ref 3.5–5.1)
SODIUM: 135 mmol/L (ref 135–145)

## 2015-08-29 LAB — PROTIME-INR
INR: 2.32 — AB (ref 0.00–1.49)
Prothrombin Time: 25.2 seconds — ABNORMAL HIGH (ref 11.6–15.2)

## 2015-08-29 LAB — GLUCOSE, CAPILLARY
GLUCOSE-CAPILLARY: 85 mg/dL (ref 65–99)
GLUCOSE-CAPILLARY: 110 mg/dL — AB (ref 65–99)
Glucose-Capillary: 135 mg/dL — ABNORMAL HIGH (ref 65–99)
Glucose-Capillary: 133 mg/dL — ABNORMAL HIGH (ref 65–99)

## 2015-08-29 LAB — HEMOGLOBIN A1C
Hgb A1c MFr Bld: 4.9 % (ref 4.8–5.6)
Mean Plasma Glucose: 94 mg/dL

## 2015-08-29 LAB — MAGNESIUM: Magnesium: 1.5 mg/dL — ABNORMAL LOW (ref 1.7–2.4)

## 2015-08-29 MED ORDER — IPRATROPIUM-ALBUTEROL 0.5-2.5 (3) MG/3ML IN SOLN
3.0000 mL | Freq: Three times a day (TID) | RESPIRATORY_TRACT | Status: DC
  Administered 2015-08-29 (×2): 3 mL via RESPIRATORY_TRACT
  Filled 2015-08-29 (×3): qty 3

## 2015-08-29 MED ORDER — SODIUM CHLORIDE 0.9 % IJ SOLN
10.0000 mL | INTRAMUSCULAR | Status: DC | PRN
  Administered 2015-08-29 – 2015-08-30 (×4): 30 mL
  Administered 2015-08-31 (×2): 20 mL
  Administered 2015-08-31 – 2015-09-01 (×2): 10 mL
  Administered 2015-09-01: 30 mL
  Administered 2015-09-02: 20 mL
  Administered 2015-09-02: 30 mL
  Administered 2015-09-03 – 2015-09-08 (×6): 10 mL
  Administered 2015-09-09: 20 mL
  Administered 2015-09-10: 10 mL
  Administered 2015-09-10: 20 mL
  Administered 2015-09-11 (×3): 10 mL
  Administered 2015-09-12: 20 mL
  Administered 2015-09-14 – 2015-09-16 (×3): 10 mL
  Administered 2015-09-17: 30 mL
  Administered 2015-09-18 – 2015-09-22 (×4): 10 mL
  Administered 2015-09-22: 30 mL
  Administered 2015-09-23: 10 mL
  Administered 2015-09-24: 30 mL
  Filled 2015-08-29 (×34): qty 40

## 2015-08-29 NOTE — Progress Notes (Signed)
Subjective/Complaints: Patient states he has trouble keeping up what day it is. Appreciate infectious disease note No chest pains or shortness of breath  Review systems negative for lower extremity pain, positive for numbness in feet positive for sacral pain when he lays on his back  Objective: Vital Signs: Blood pressure 103/69, pulse 106, temperature 97.5 F (36.4 C), temperature source Oral, resp. rate 18, height '5\' 9"'  (1.753 m), weight 107.4 kg (236 lb 12.4 oz), SpO2 98 %. No results found. Results for orders placed or performed during the hospital encounter of 08/27/15 (from the past 72 hour(s))  Glucose, capillary     Status: Abnormal   Collection Time: 08/27/15  8:48 PM  Result Value Ref Range   Glucose-Capillary 114 (H) 65 - 99 mg/dL  Protime-INR     Status: Abnormal   Collection Time: 08/28/15  4:30 AM  Result Value Ref Range   Prothrombin Time 25.7 (H) 11.6 - 15.2 seconds   INR 2.38 (H) 0.00 - 1.49  CBC WITH DIFFERENTIAL     Status: Abnormal   Collection Time: 08/28/15  4:30 AM  Result Value Ref Range   WBC 6.6 4.0 - 10.5 K/uL   RBC 2.99 (L) 4.22 - 5.81 MIL/uL   Hemoglobin 8.2 (L) 13.0 - 17.0 g/dL   HCT 26.9 (L) 39.0 - 52.0 %   MCV 90.0 78.0 - 100.0 fL   MCH 27.4 26.0 - 34.0 pg   MCHC 30.5 30.0 - 36.0 g/dL   RDW 17.7 (H) 11.5 - 15.5 %   Platelets 349 150 - 400 K/uL   Neutrophils Relative % 55 %   Neutro Abs 3.6 1.7 - 7.7 K/uL   Lymphocytes Relative 23 %   Lymphs Abs 1.5 0.7 - 4.0 K/uL   Monocytes Relative 10 %   Monocytes Absolute 0.7 0.1 - 1.0 K/uL   Eosinophils Relative 11 %   Eosinophils Absolute 0.7 0.0 - 0.7 K/uL   Basophils Relative 1 %   Basophils Absolute 0.1 0.0 - 0.1 K/uL  Comprehensive metabolic panel     Status: Abnormal   Collection Time: 08/28/15  4:30 AM  Result Value Ref Range   Sodium 139 135 - 145 mmol/L   Potassium 3.3 (L) 3.5 - 5.1 mmol/L   Chloride 99 (L) 101 - 111 mmol/L   CO2 33 (H) 22 - 32 mmol/L   Glucose, Bld 100 (H) 65 - 99  mg/dL   BUN 8 6 - 20 mg/dL   Creatinine, Ser 0.32 (L) 0.61 - 1.24 mg/dL   Calcium 8.1 (L) 8.9 - 10.3 mg/dL   Total Protein 5.2 (L) 6.5 - 8.1 g/dL   Albumin 1.6 (L) 3.5 - 5.0 g/dL   AST 22 15 - 41 U/L   ALT 10 (L) 17 - 63 U/L   Alkaline Phosphatase 80 38 - 126 U/L   Total Bilirubin 0.6 0.3 - 1.2 mg/dL   GFR calc non Af Amer >60 >60 mL/min   GFR calc Af Amer >60 >60 mL/min    Comment: (NOTE) The eGFR has been calculated using the CKD EPI equation. This calculation has not been validated in all clinical situations. eGFR's persistently <60 mL/min signify possible Chronic Kidney Disease.    Anion gap 7 5 - 15  Magnesium     Status: Abnormal   Collection Time: 08/28/15  4:30 AM  Result Value Ref Range   Magnesium 1.3 (L) 1.7 - 2.4 mg/dL  Glucose, capillary     Status: None  Collection Time: 08/28/15  6:26 AM  Result Value Ref Range   Glucose-Capillary 95 65 - 99 mg/dL   Comment 1 Notify RN   Hemoglobin A1c     Status: None   Collection Time: 08/28/15  8:00 AM  Result Value Ref Range   Hgb A1c MFr Bld 4.9 4.8 - 5.6 %    Comment: (NOTE)         Pre-diabetes: 5.7 - 6.4         Diabetes: >6.4         Glycemic control for adults with diabetes: <7.0    Mean Plasma Glucose 94 mg/dL    Comment: (NOTE) Performed At: [facility] [place], [place] [id number] Lindon Romp MD [phone]   Glucose, capillary     Status: Abnormal   Collection Time: 08/28/15 11:59 AM  Result Value Ref Range   Glucose-Capillary 114 (H) 65 - 99 mg/dL   Comment 1 Notify RN   Glucose, capillary     Status: Abnormal   Collection Time: 08/28/15  4:56 PM  Result Value Ref Range   Glucose-Capillary 113 (H) 65 - 99 mg/dL   Comment 1 Notify RN   Glucose, capillary     Status: Abnormal   Collection Time: 08/28/15  8:35 PM  Result Value Ref Range   Glucose-Capillary 118 (H) 65 - 99 mg/dL  Protime-INR     Status: Abnormal   Collection Time: 08/29/15  5:02 AM  Result Value Ref  Range   Prothrombin Time 25.2 (H) 11.6 - 15.2 seconds   INR 2.32 (H) 0.00 - 3.66  Basic metabolic panel     Status: Abnormal   Collection Time: 08/29/15  5:02 AM  Result Value Ref Range   Sodium 135 135 - 145 mmol/L   Potassium 3.6 3.5 - 5.1 mmol/L   Chloride 97 (L) 101 - 111 mmol/L   CO2 31 22 - 32 mmol/L   Glucose, Bld 99 65 - 99 mg/dL   BUN 9 6 - 20 mg/dL   Creatinine, Ser 0.35 (L) 0.61 - 1.24 mg/dL   Calcium 8.0 (L) 8.9 - 10.3 mg/dL   GFR calc non Af Amer >60 >60 mL/min   GFR calc Af Amer >60 >60 mL/min    Comment: (NOTE) The eGFR has been calculated using the CKD EPI equation. This calculation has not been validated in all clinical situations. eGFR's persistently <60 mL/min signify possible Chronic Kidney Disease.    Anion gap 7 5 - 15  Glucose, capillary     Status: None   Collection Time: 08/29/15  6:46 AM  Result Value Ref Range   Glucose-Capillary 85 65 - 99 mg/dL   Comment 1 Notify RN   Glucose, capillary     Status: Abnormal   Collection Time: 08/29/15 11:40 AM  Result Value Ref Range   Glucose-Capillary 135 (H) 65 - 99 mg/dL   Comment 1 Notify RN     Nursing note and vitals reviewed. Constitutional: He is oriented to person, place, and time.he is slower today He appears well-developed and well-nourished. No distress.  HENT:  Head: Normocephalic and atraumatic.  Mouth/Throat: Oropharynx is clear and moist.  Eyes: Conjunctivae and EOM are normal. Pupils are equal, round, and reactive to light.  Neck: Decreased range of motion present.  Trach site with dry dressing and ATC in place.  Cardiovascular: Normal rate and regular rhythm. noES Respiratory: Effort normal and breath sounds normal. No respiratory distress. He has no wheezes.  He exhibits no tenderness.  Sternotomy incision is clean,dry and intact--healing well.  GI: Soft. Bowel sounds are normal.  Midline incision has healed to surface but not epithealized yet. Ostomy with bilious drainage.  Multiple drains LLQ sutured in place and mild irritation at entry site.  Genitourinary:  Min scrotal edema noted. Foley in place.  Musculoskeletal: He exhibits edema (LLE with 1+ edema tibially and 2+ pedally. RLE with 1+ edema pedally. ). He exhibits no tenderness.  Right knee with 1-2+ effusion. No tenderness with ROM. Tends to keep RLE rotated outward.  Neurological: He is alert and oriented to person, place, and time.  Able to phonate well with PMSV. Speech clear. Follows basic commands without difficulty.  Skin: Skin is warm and dry. He is not diaphoretic.  Right lower shin with stasis changes. Multiple ulcers on bilateral heels,the right heel blister is partially hemorrhagic, left heel has a small eschar right foot, left thigh covered with foam and opsite. Psychiatric: He has a normal mood and affect. His speech is normal and behavior is normal. Cognition and memory are normal.  Motor strength is 3 minus bilateral deltoid 4 minus bicep tricep grip, 2 minus bilateral hip flexors 3 minus knee extensors 3 minus bilateral ankle dorsiflexor plantar flexor Sensation is absent in bilateral feet to light touch. Present at the knees Fingers has some difficulty identifying light touch    Assessment/Plan: 1. 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 Physiatrist is providing close team supervision and 24 hour management of active medical problems listed below. Physiatrist and rehab team continue to assess barriers to discharge/monitor patient progress toward functional and medical goals. FIM: Function - Bathing Position: Sitting EOB Body parts bathed by patient: Right arm, Left arm, Chest, Abdomen, Right upper leg, Left upper leg Body parts bathed by helper: Back, Left lower leg, Right lower leg Bathing not  applicable: Front perineal area, Buttocks (RN just cleaned and changed dressing on buttocks) Assist Level: 2 helpers  Function- Upper Body Dressing/Undressing What is the patient wearing?: Pull over shirt/dress Pull over shirt/dress - Perfomed by helper: Thread/unthread right sleeve, Thread/unthread left sleeve, Put head through opening, Pull shirt over trunk Assist Level: 2 helpers Function - Lower Body Dressing/Undressing What is the patient wearing?: Non-skid slipper socks Non-skid slipper socks- Performed by helper: Don/doff right sock, Don/doff left sock Assist for footwear: Dependant Assist for lower body dressing: 2 Helpers  Function - Toileting Toileting activity did not occur: Safety/medical concerns (ileostomy/foley)  Function - Air cabin crew transfer activity did not occur: Safety/medical concerns (ileostomy/foley)  Function - Chair/bed transfer Chair/bed transfer activity did not occur: Safety/medical concerns  Function - Locomotion: Wheelchair Will patient use wheelchair at discharge?: Yes Wheelchair activity did not occur: Safety/medical concerns Wheel 50 feet with 2 turns activity did not occur: Safety/medical concerns Wheel 150 feet activity did not occur: Safety/medical concerns Function - Locomotion: Ambulation Ambulation activity did not occur: Safety/medical concerns Walk 10 feet activity did not occur: Safety/medical concerns Walk 50 feet with 2 turns activity did not occur: Safety/medical concerns Walk 150 feet activity did not occur: Safety/medical concerns Walk 10 feet on uneven surfaces activity did not occur: Safety/medical concerns  Function - Comprehension Comprehension: Auditory Comprehension assist level: Understands basic 75 - 89% of the time/ requires cueing 10 - 24% of the time  Function - Expression Expression: Verbal Expression assistive device: Talk  trach valve Expression assist level: Expresses basic 90% of the time/requires  cueing < 10% of the time.  Function - Social Interaction Social Interaction assist level: Interacts appropriately 75 - 89% of the time - Needs redirection for appropriate language or to initiate interaction.  Function - Problem Solving Problem solving assist level: Solves basic 50 - 74% of the time/requires cueing 25 - 49% of the time  Function - Memory Memory assist level: Recognizes or recalls 50 - 74% of the time/requires cueing 25 - 49% of the time Patient normally able to recall (first 3 days only): Current season, Location of own room, Staff names and faces, That he or she is in a hospital  1. 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 the patient has severe quadriparesis 2. LLE DVT treatment /Anticoagulation: Pharmaceutical: Coumadin 3. Pain Management: Tylenol for prn for pain. Will continue oxycodone prn if needed for sever pain. 4. Mood: LCSW to follow for evaluation and support.  5. Neuropsych: This patient is capable of making decisions on his own behalf. 6. Skin/Wound Care: Will consult WOC for input on sacral wound as well as multiple healing ulcers. Will add Vit C and Zinc to help promote healing.general surgery for sacral wound debridement, PR AFO ordered for heels, await air bed which has been ordered  7. Fluids/Electrolytes/Nutrition: Monitor I/O. Will consult dietician to help with food choices. Increase prosource to TID. Added ensure between meals as unable to tolerate a lot of food at one time.  8. Abdominal abscess: Continue drain care bid. On Daptomycin --question duration. Flagyl to contiue till 10/26 per records.  9. Hypokalemia: Improved with supplement in past 24 hours.still requires additional oral potassium  10. VDRF/ History of OSA: To continue current trach till mobility improves per CCM. Tolerating humidified air. We will  need to reconsult pulmonary before decannulation 11. Anxiety disorder: Will resume low dose Xanax as used this for insomnia at home. given his respiratory compromise will need to limit dosing 12. Anemia: Likely due to infection/chronic illness. Hgb stable at 8.2 13. Anasarca: Improved with diuresis. Continue Lasix.  14. Diastolic CHF: Off amiodarone, Ace and statin at this time due to recurrent hypotension and multiple medical issues. Monitor for signs of overload. Check daily weights. Monitor BP bid.  15.  Hypomagnesemia- supplement, repeat Mg level on 10/17 LOS (Days) 2 A FACE TO FACE EVALUATION WAS PERFORMED  Ulysee Fyock E 08/29/2015, 12:02 PM

## 2015-08-29 NOTE — Plan of Care (Signed)
Problem: RH BLADDER ELIMINATION Goal: RH STG MANAGE BLADDER WITH EQUIPMENT WITH ASSISTANCE STG Manage Bladder With Equipment With Mod Assistance  Outcome: Progressing Foley changed 08/28/15. Foley care performed x2 on shift.   Problem: RH SKIN INTEGRITY Goal: RH STG SKIN FREE OF INFECTION/BREAKDOWN Patient's skin remain free from new infection/breakdown while on Rehab.  Outcome: Progressing Bilateral heels elevated, patient repositioned, use of pillows.   Goal: RH STG ABLE TO PERFORM INCISION/WOUND CARE W/ASSISTANCE STG Able To Perform Incision/Wound Care With total Assistance.  Outcome: Progressing Sacral wound dressing performed. Patient refused trach care "You just want to keep me awake."

## 2015-08-29 NOTE — Progress Notes (Signed)
ANTICOAGULATION CONSULT NOTE - Follow Up Consult  Pharmacy Consult for Coumadin Indication: DVT  Allergies  Allergen Reactions  . Levaquin [Levofloxacin] Nausea Only  . Penicillins Hives  . Doxycycline Rash    Patient Measurements: Height: 5\' 9"  (175.3 cm) Weight: 236 lb 12.4 oz (107.4 kg) IBW/kg (Calculated) : 70.7  Vital Signs: Temp: 97.5 F (36.4 C) (10/15 0504) Temp Source: Oral (10/15 0504) BP: 103/69 mmHg (10/15 0504) Pulse Rate: 106 (10/15 0504)  Labs:  Recent Labs  08/27/15 0620 08/27/15 0920 08/28/15 0430 08/29/15 0502  HGB  --   --  8.2*  --   HCT  --   --  26.9*  --   PLT  --   --  349  --   LABPROT 26.4*  --  25.7* 25.2*  INR 2.46*  --  2.38* 2.32*  CREATININE  --  0.33* 0.32* 0.35*    Estimated Creatinine Clearance: 103.8 mL/min (by C-G formula based on Cr of 0.35).   Assessment: [age] YOM with prolonged hospital/ICU stay d/t sepsis/abdominal infection who [facility]. He is continued on coumadin for new LLE DVT (08/05/2015). Remains therapeutic with INR 2.32 today. [facility] INR has been stable and therapeutic on 2.5mg  daily for the past week. No CBC today but previously labs stable. No reported bleeding.   Goal of Therapy:  INR 2-3 Monitor platelets by anticoagulation protocol: Yes   Plan:  Continue Warf 2.5mg  daily  Daily INR Monitor CBC, s/sx bleeding  Sherle Poeob Vincent, PharmD. PGY-1 Resident  Pager: [phone]   08/29/2015,9:37 AM

## 2015-08-29 NOTE — Progress Notes (Signed)
Physical Therapy Session Note  Patient Details  Name: Jonathan Le MRN: [id number] Date of Birth: 01/30/1946  Today's Date: 08/29/2015 PT Individual Time: 1300-1408 PT Individual Time Calculation (min): 68 min   Short Term Goals: Week 1:  PT Short Term Goal 1 (Week 1): Will perform supine <>sit with maxA +1 and bedrails PT Short Term Goal 2 (Week 1): Will perform sitting balance on EOB x10 min with no UE support while performing functional task PT Short Term Goal 3 (Week 1): Will perform sit <>stand with maxA +2 PT Short Term Goal 4 (Week 1): Will perform w/c propulsion x100' with modA  Skilled Therapeutic Interventions/Progress Updates:  Pt was seen bedside in the pm. Pt transferred supine to edge of bed with side rail, head of bed elevated and max to total A. Pt tolerated edge of bed about 10 minutes with c/s to min A and verbal cues. Pt transferred edge of bed to w/c with sliding board and +2 max to total A. Pt propelled w/c about 40 feet with B UEs and max A with verbal cues. In gym, pt attempted to stand in parallel bars with max A, pt able to clear buttocks off w/c but unable to stand completely. Performed LE exercises seated in w/c, 2 sets x 10 reps each. Pt returned to room. Pt transferred w/c to edge of bed with sliding board and total A. Pt transferred edge of bed to supine with max to total A. Pt rolled R/L with side rail and max to total A. Pt moved up in bed with bed controls and min A with verbal cues. Pt left positioned in R side lying with call bell within reach.   Therapy Documentation Precautions:  Precautions Precautions: Fall Restrictions Weight Bearing Restrictions: No Other Position/Activity Restrictions: trach collar General:   Pain: Pt c/o 7/10 back pain with mobility.   See Function Navigator for Current Functional Status.   Therapy/Group: Individual Therapy  [name], [name] 08/29/2015, 3:42 PM

## 2015-08-29 NOTE — Progress Notes (Signed)
Occupational Therapy Session Note  Patient Details  Name: Jonathan HarpsHarold D Blowe MRN: [id number] Date of Birth: 07/25/1946  Today's Date: 08/29/2015 OT Individual Time:  0900-1000  (60 min)  1st session OT Individual Time Calculation (min): 60 min    Short Term Goals: Week 1:  OT Short Term Goal 1 (Week 1): Pt will complete bathing with mod assist seated at EOB with assist of 1 caregiver OT Short Term Goal 2 (Week 1): Pt will complete UB dressing with mod assist and min cues for problem solving/organization OT Short Term Goal 3 (Week 1): Pt will complete LB dressing at bed level with max assist OT Short Term Goal 4 (Week 1): Pt will complete sit > stand from EOB with +2 assist to complete 1 grooming task      Skilled Therapeutic Interventions/Progress Updates:    1st session:  2 helpers with basic self-care skills secondary to muscle weakness, decreased cardiorespiratoy endurance and decreased oxygen support, decreased awareness, decreased problem solving and decreased memory and decreased sitting balance.  Performed rolling to right and left with +2 for donning pants and cleaning peri care.  Perfrom UE AROM.  Left in bed with all needs in reach.   Therapy Documentation Precautions:  Precautions Precautions: Fall Restrictions Weight Bearing Restrictions: No Other Position/Activity Restrictions: trach collar General:   Vital Signs: Therapy Vitals Temp: 97.6 F (36.4 C) Temp Source: Oral Pulse Rate: (!) 110 Resp: 16 BP: 110/70 mmHg Patient Position (if appropriate): Lying Oxygen Therapy SpO2: 94 % O2 Device: Tracheostomy Collar O2 Flow Rate (L/min): 5 L/min FiO2 (%): 28 % Pain:  none      Exercises:  2nd session:  Time:1500-1545   (45 min) Pain:  4/10 general pain with movement Individual session:  Engaged in UE stretching, rolling and postioning.  Provided UE external rotation, abduction and sho. Flexion exercises and demonstration for pt to practice when not in therapy.  Ppt  needs reinforcement due to decreased STM.          See Function Navigator for Current Functional Status.   Therapy/Group: Individual Therapy  Humberto Sealsdwards, Jayleigh Notarianni J 08/29/2015, 6:50 PM

## 2015-08-29 NOTE — Progress Notes (Signed)
Speech Language Pathology Daily Session Note  Patient Details  Name: Jonathan HarpsHarold D Otterness MRN: [id number] Date of Birth: 09/03/1946  Today's Date: 08/29/2015 SLP Individual Time: 1130-1200 SLP Individual Time Calculation (min): 30 min  Short Term Goals: Week 1: SLP Short Term Goal 1 (Week 1): Pt will return demonstration of how to donn/doff speaking valve over 3 targeted sessions with supervision.  SLP Short Term Goal 2 (Week 1): Pt will recall steps of how to clean speaking valve over 3 targeted sessions with supervision.  SLP Short Term Goal 3 (Week 1): Pt will utliize compensatory strategies to facilitate recall of daily information with min assist verbal cues.   SLP Short Term Goal 4 (Week 1): Pt will complete basic to semi-complex self care and/or home management tasks with min assist verbal cues for functional problem solving  SLP Short Term Goal 5 (Week 1): Pt will recognize and correct errors in the moment during functional tasks with min assist verbal cues.   Skilled Therapeutic Interventions: Skilled ST intervention provided with focus on cognitive goals. Pt seen in room for ST tx at bedside. Pt education provided on how to clean donn/doff PMSV.  Pt required max verbal and tactile cues to donn/doff PMSV independently. Pt recalled instructions on how to clean and when to wear valve, no cues. Pt completed medication management tasks with 90% accuracy, no assist. Pt states that his wife organizes his medications for him at home.    Function:  Eating Eating                 Cognition Comprehension Comprehension assist level: Understands basic 75 - 89% of the time/ requires cueing 10 - 24% of the time  Expression Expression assistive device: Talk trach valve Expression assist level: Expresses basic 90% of the time/requires cueing < 10% of the time.  Social Interaction Social Interaction assist level: Interacts appropriately 75 - 89% of the time - Needs redirection for appropriate  language or to initiate interaction.  Problem Solving Problem solving assist level: Solves basic 50 - 74% of the time/requires cueing 25 - 49% of the time  Memory Memory assist level: Recognizes or recalls 50 - 74% of the time/requires cueing 25 - 49% of the time    Pain Pain Assessment Pain Assessment: No/denies pain  Therapy/Group: Individual Therapy  Vola Beneke, Kara PacerLeah N 08/29/2015, 12:58 PM

## 2015-08-30 ENCOUNTER — Inpatient Hospital Stay (HOSPITAL_COMMUNITY): Payer: Medicare Other | Admitting: Physical Therapy

## 2015-08-30 DIAGNOSIS — G6281 Critical illness polyneuropathy: Secondary | ICD-10-CM

## 2015-08-30 DIAGNOSIS — A491 Streptococcal infection, unspecified site: Secondary | ICD-10-CM | POA: Diagnosis present

## 2015-08-30 DIAGNOSIS — G7281 Critical illness myopathy: Secondary | ICD-10-CM

## 2015-08-30 DIAGNOSIS — K651 Peritoneal abscess: Secondary | ICD-10-CM

## 2015-08-30 DIAGNOSIS — E46 Unspecified protein-calorie malnutrition: Secondary | ICD-10-CM | POA: Diagnosis present

## 2015-08-30 DIAGNOSIS — L89159 Pressure ulcer of sacral region, unspecified stage: Secondary | ICD-10-CM | POA: Diagnosis present

## 2015-08-30 DIAGNOSIS — R5381 Other malaise: Secondary | ICD-10-CM

## 2015-08-30 LAB — VITAMIN B12: Vitamin B-12: 451 pg/mL (ref 180–914)

## 2015-08-30 LAB — GLUCOSE, CAPILLARY
GLUCOSE-CAPILLARY: 87 mg/dL (ref 65–99)
GLUCOSE-CAPILLARY: 108 mg/dL — AB (ref 65–99)
Glucose-Capillary: 110 mg/dL — ABNORMAL HIGH (ref 65–99)
Glucose-Capillary: 116 mg/dL — ABNORMAL HIGH (ref 65–99)

## 2015-08-30 LAB — MAGNESIUM: Magnesium: 1.5 mg/dL — ABNORMAL LOW (ref 1.7–2.4)

## 2015-08-30 LAB — PROTIME-INR
INR: 2.3 — ABNORMAL HIGH (ref 0.00–1.49)
Prothrombin Time: 25.1 seconds — ABNORMAL HIGH (ref 11.6–15.2)

## 2015-08-30 MED ORDER — IPRATROPIUM-ALBUTEROL 0.5-2.5 (3) MG/3ML IN SOLN
3.0000 mL | RESPIRATORY_TRACT | Status: DC | PRN
  Administered 2015-09-05: 3 mL via RESPIRATORY_TRACT
  Filled 2015-08-30: qty 3

## 2015-08-30 MED ORDER — MAGNESIUM OXIDE 400 (241.3 MG) MG PO TABS
800.0000 mg | ORAL_TABLET | Freq: Two times a day (BID) | ORAL | Status: DC
  Administered 2015-08-30 – 2015-09-25 (×52): 800 mg via ORAL
  Filled 2015-08-30 (×54): qty 2

## 2015-08-30 NOTE — Progress Notes (Signed)
Dr. Wynn BankerKirsteins was notified that patient cannot tolerate the prevalon boots due to irritation/allergy to the material.  MD states he will try another boot.  Will continue to monitor.

## 2015-08-30 NOTE — Progress Notes (Signed)
Physical Therapy Session Note  Patient Details  Name: Jonathan Le MRN: [id number] Date of Birth: 11/11/1946  Today's Date: 08/30/2015 PT Individual Time: 0900-1000 PT Individual Time Calculation (min): 60 min   Short Term Goals: Week 1:  PT Short Term Goal 1 (Week 1): Will perform supine <>sit with maxA +1 and bedrails PT Short Term Goal 2 (Week 1): Will perform sitting balance on EOB x10 min with no UE support while performing functional task PT Short Term Goal 3 (Week 1): Will perform sit <>stand with maxA +2 PT Short Term Goal 4 (Week 1): Will perform w/c propulsion x100' with modA  Skilled Therapeutic Interventions/Progress Updates:  Pt was seen bedside in the am. Pt reluctant to participate with therapy secondary to pain in sacral area from wound. Treatment focused on LE strengthening. Pt performed 3 sets x 10 reps each, quad sets, heel slides, hip abd/add, SAQs, and ankle pumps. Following treatment pt rolled into L sidelying with mod A and side rails for comfort.   Therapy Documentation Precautions:  Precautions Precautions: Fall Restrictions Weight Bearing Restrictions: No Other Position/Activity Restrictions: trach collar General:   Pain: Pt c/o 10/10 pain sacral area.   See Function Navigator for Current Functional Status.   Therapy/Group: Individual Therapy  [name], Jonathan Le 08/30/2015, 12:34 PM

## 2015-08-30 NOTE — Progress Notes (Signed)
Referring Physician(s): Derrell LollingRamirez  Chief Complaint:  Colectomy 8/4 Post abscesses  Subjective:  1 Rt and 1 Lt abs drains placed 9/6 New 2 Lt drains placed 9/29 CT 10/7 revealed reduced abscesses on Left Mostly resolved abscess on Rt Rt remains --not flushing  no output 3 Lt drains intact  output diminishing Pt better daily  Allergies: Levaquin; Penicillins; and Doxycycline  Medications: Prior to Admission medications   Medication Sig Start Date End Date Taking? Authorizing Provider  Amino Acids-Protein Hydrolys (FEEDING SUPPLEMENT, PRO-STAT SUGAR FREE 64,) LIQD Place 30 mLs into feeding tube 5 (five) times daily. 07/25/15  Yes [name], NP  white petrolatum (VASELINE) GEL Apply 1 application topically daily. Apply to L nare wound daily 07/08/15  Yes [name], NP     Vital Signs: BP 103/69 mmHg  Pulse 97  Temp(Src) 97.6 F (36.4 C) (Oral)  Resp 16  Ht 5\' 9"  (1.753 m)  Wt 253 lb 9.6 oz (115.032 kg)  BMI 37.43 kg/m2  SpO2 91%  Physical Exam  Abdominal: Soft. Bowel sounds are normal.  All sites of 4 drains clean and dry NT no bleeding Output Rt--none Output Lt: all 3 with 30 cc daily Serous at this point  Skin: Skin is warm and dry.    Imaging: No results found.  Labs:  CBC:  Recent Labs  08/07/15 0700 08/16/15 0701 08/20/15 0648 08/28/15 0430  WBC 8.7 9.2 5.2 6.6  HGB 7.4* 7.5* 8.8* 8.2*  HCT 24.0* 25.6* 29.3* 26.9*  PLT 418* 387 328 349    COAGS:  Recent Labs  07/21/15 0815  07/23/15 1200 07/25/15 0419 07/30/15 0627  08/27/15 0620 08/28/15 0430 08/29/15 0502 08/30/15 0440  INR 1.65*  < > 2.62* 1.48 1.35  < > 2.46* 2.38* 2.32* 2.30*  APTT 29  --  38* 28 31  --   --   --   --   --   < > = values in this interval not displayed.  BMP:  Recent Labs  08/26/15 1202 08/27/15 0920 08/28/15 0430 08/29/15 0502  NA 135 141 139 135  K 2.8* 3.2* 3.3* 3.6  CL 97* 103 99* 97*  CO2 32 32 33* 31  GLUCOSE 130* 128* 100* 99    BUN 7 8 8 9   CALCIUM 7.8* 8.1* 8.1* 8.0*  CREATININE 0.34* 0.33* 0.32* 0.35*  GFRNONAA >60 >60 >60 >60  GFRAA >60 >60 >60 >60    LIVER FUNCTION TESTS:  Recent Labs  07/23/15 0600 07/24/15 0345 07/25/15 0419  07/28/15 0630 08/02/15 0620 08/16/15 0701 08/28/15 0430  BILITOT 1.0 0.7 0.9  --   --   --   --  0.6  AST 30 40 35  --   --   --   --  22  ALT 15* 15* 14*  --   --   --   --  10*  ALKPHOS 154* 167* 199*  --   --   --   --  80  PROT 4.6* 4.3* 4.5*  --   --   --   --  5.2*  ALBUMIN <1.0* <1.0* <1.0*  < > 1.4* 1.2* 1.2* 1.6*  < > = values in this interval not displayed.  Assessment and Plan:  Colectomy 06/18/15 Abscesses post surgery 4 drains intact Output slowing from Lt x 3 Now serous output Consider re CT maybe this week We will follow  Signed: Jasmeen Fritsch A 08/30/2015, 8:31 AM   I spent a total  of 15 Minutes at the the patient's bedside AND on the patient's hospital floor or unit, greater than 50% of which was counseling/coordinating care for abscess drains

## 2015-08-30 NOTE — Progress Notes (Signed)
ANTIBIOTIC / Anticoagulation CONSULT NOTE - FOLLOW UP  Pharmacy Consult for Merropenem / Warfarin Indication: Intraabdominal Infection / LLE DVT - new 9/16  Allergies  Allergen Reactions  . Levaquin [Levofloxacin] Nausea Only  . Penicillins Hives  . Doxycycline Rash    Patient Measurements: Height: 5\' 9"  (175.3 cm) Weight: 253 lb 9.6 oz (115.032 kg) IBW/kg (Calculated) : 70.7   Vital Signs: Temp: 97.6 F (36.4 C) (10/16 0539) Temp Source: Oral (10/16 0539) BP: 103/69 mmHg (10/16 0539) Pulse Rate: 104 (10/16 0838) Intake/Output from previous day: 10/15 0701 - 10/16 0700 In: 862 [P.O.:800] Out: 6410 [Urine:6020; Drains:90; Stool:300] Intake/Output from this shift:    Labs:  Recent Labs  08/28/15 0430 08/29/15 0502  WBC 6.6  --   HGB 8.2*  --   PLT 349  --   CREATININE 0.32* 0.35*   Estimated Creatinine Clearance: 107.4 mL/min (by C-G formula based on Cr of 0.35). No results for input(s): VANCOTROUGH, VANCOPEAK, VANCORANDOM, GENTTROUGH, GENTPEAK, GENTRANDOM, TOBRATROUGH, TOBRAPEAK, TOBRARND, AMIKACINPEAK, AMIKACINTROU, AMIKACIN in the last 72 hours.   Microbiology: Recent Results (from the past 720 hour(s))  Culture, blood (routine x 2)     Status: None   Collection Time: 08/05/15  1:47 PM  Result Value Ref Range Status   Specimen Description BLOOD PICC LINE  Final   Special Requests BOTTLES DRAWN AEROBIC AND ANAEROBIC 10CC  Final   Culture NO GROWTH 5 DAYS  Final   Report Status 08/10/2015 FINAL  Final  Culture, blood (routine x 2)     Status: None   Collection Time: 08/05/15  1:55 PM  Result Value Ref Range Status   Specimen Description BLOOD LEFT HAND  Final   Special Requests BOTTLES DRAWN AEROBIC ONLY 10CC  Final   Culture NO GROWTH 5 DAYS  Final   Report Status 08/10/2015 FINAL  Final  C difficile quick scan w PCR reflex     Status: None   Collection Time: 08/05/15  2:04 PM  Result Value Ref Range Status   C Diff antigen NEGATIVE NEGATIVE Final   C  Diff toxin NEGATIVE NEGATIVE Final   C Diff interpretation Negative for toxigenic C. difficile  Final  Stat Gram stain     Status: None   Collection Time: 08/05/15  4:30 PM  Result Value Ref Range Status   Specimen Description SPUTUM  Final   Special Requests NONE  Final   Gram Stain   Final    RARE WBC PRESENT, PREDOMINANTLY MONONUCLEAR RARE GRAM POSITIVE COCCI IN PAIRS Gram Stain Report Called to,Read Back By and Verified WithArminda Resides: C THOMPSON RN [id number] 08/05/15 A BROWNING    Report Status 08/05/2015 FINAL  Final  Culture, respiratory (NON-Expectorated)     Status: None   Collection Time: 08/05/15  4:30 PM  Result Value Ref Range Status   Specimen Description TRACHEAL ASPIRATE  Final   Special Requests NONE  Final   Gram Stain   Final    MODERATE WBC PRESENT,BOTH PMN AND MONONUCLEAR RARE SQUAMOUS EPITHELIAL CELLS PRESENT RARE GRAM POSITIVE COCCI IN PAIRS Performed at [facility]    Culture   Final    Non-Pathogenic Oropharyngeal-type Flora Isolated. Performed at [facility]    Report Status 08/08/2015 FINAL  Final  Culture, Urine     Status: None   Collection Time: 08/05/15  6:21 PM  Result Value Ref Range Status   Specimen Description URINE, RANDOM  Final   Special Requests NONE  Final   Culture  NO GROWTH 2 DAYS  Final   Report Status 08/07/2015 FINAL  Final  Stat Gram stain     Status: None   Collection Time: 08/05/15  6:21 PM  Result Value Ref Range Status   Specimen Description URINE, RANDOM  Final   Special Requests NONE  Final   Gram Stain   Final    WBC PRESENT, PREDOMINANTLY MONONUCLEAR NO ORGANISMS SEEN CYTOSPIN    Report Status 08/05/2015 FINAL  Final    Anti-infectives    Start     Dose/Rate Route Frequency Ordered Stop   08/27/15 2200  meropenem (MERREM) 1 g in sodium chloride 0.9 % 100 mL IVPB     1 g 200 mL/hr over 30 Minutes Intravenous 3 times per day 08/27/15 1658 09/03/15 2359   08/27/15 1615  metroNIDAZOLE (FLAGYL) tablet 500 mg   Status:  Discontinued     500 mg Oral 3 times per day 08/27/15 1610 08/28/15 1651      Assessment: 69 yo m admitted to [facility] with intra-abdominal abscess / sepsis, became VDRF and weaned in [facility] now to CIR for rehab.    Anticoagulation: During initial stay developed DVT in LLE treated with enoxaparin bridge to warfarin. INR has been stable and continues to be therapeutic on warfarin 2.5mg  daily. INR  2.30. No CBC today but previously labs stable. No reported bleeding.  Antibiotic: Intra-abdominal infection with abscess and drains being followed by IR.  Continue Merropenem 1gm IV q8 until 10/20. Flagyl  po q8 discontinued 10/14.  WBC WNL, afebrile.    Goal of Therapy:  INR 2-3  Plan:  Meropenem 1gm IV q8 end 10/20  Warf 2.5mg  daily  Daily INR Monitor CBC, s/sx bleeding  Greggory Stallion, PharmD Clinical Pharmacy Resident Pager # [phone] 08/30/2015 9:27 AM

## 2015-08-30 NOTE — Progress Notes (Signed)
Subjective/Complaints: Patient asked whether there is something that can be done to fix him. We discussed his various medical problems and the treatment for these issues. We emphasized that there is no quick fix for issues such as weakness, skin issues. No chest pains or shortness of breath  Review systems negative for lower extremity pain, positive for numbness in feet positive for sacral pain when he lays on his back  Objective: Vital Signs: Blood pressure 103/69, pulse 104, temperature 97.6 F (36.4 C), temperature source Oral, resp. rate 15, height _0  (1.753 m), weight 115.032 kg (253 lb 9.6 oz), SpO2 92 %. No results found. Results for orders placed or performed during the hospital encounter of 08/27/15 (from the past 72 hour(s))  Glucose, capillary     Status: Abnormal   Collection Time: 08/27/15  8:48 PM  Result Value Ref Range   Glucose-Capillary 114 (H) 65 - 99 mg/dL  Protime-INR     Status: Abnormal   Collection Time: 08/28/15  4:30 AM  Result Value Ref Range   Prothrombin Time 25.7 (H) 11.6 - 15.2 seconds   INR 2.38 (H) 0.00 - 1.49  CBC WITH DIFFERENTIAL     Status: Abnormal   Collection Time: 08/28/15  4:30 AM  Result Value Ref Range   WBC 6.6 4.0 - 10.5 K/uL   RBC 2.99 (L) 4.22 - 5.81 MIL/uL   Hemoglobin 8.2 (L) 13.0 - 17.0 g/dL   HCT 26.9 (L) 39.0 - 52.0 %   MCV 90.0 78.0 - 100.0 fL   MCH 27.4 26.0 - 34.0 pg   MCHC 30.5 30.0 - 36.0 g/dL   RDW 17.7 (H) 11.5 - 15.5 %   Platelets 349 150 - 400 K/uL   Neutrophils Relative % 55 %   Neutro Abs 3.6 1.7 - 7.7 K/uL   Lymphocytes Relative 23 %   Lymphs Abs 1.5 0.7 - 4.0 K/uL   Monocytes Relative 10 %   Monocytes Absolute 0.7 0.1 - 1.0 K/uL   Eosinophils Relative 11 %   Eosinophils Absolute 0.7 0.0 - 0.7 K/uL   Basophils Relative 1 %   Basophils Absolute 0.1 0.0 - 0.1 K/uL  Comprehensive metabolic panel     Status: Abnormal   Collection Time: 08/28/15  4:30 AM  Result Value Ref Range   Sodium 139 135 - 145  mmol/L   Potassium 3.3 (L) 3.5 - 5.1 mmol/L   Chloride 99 (L) 101 - 111 mmol/L   CO2 33 (H) 22 - 32 mmol/L   Glucose, Bld 100 (H) 65 - 99 mg/dL   BUN 8 6 - 20 mg/dL   Creatinine, Ser 0.32 (L) 0.61 - 1.24 mg/dL   Calcium 8.1 (L) 8.9 - 10.3 mg/dL   Total Protein 5.2 (L) 6.5 - 8.1 g/dL   Albumin 1.6 (L) 3.5 - 5.0 g/dL   AST 22 15 - 41 U/L   ALT 10 (L) 17 - 63 U/L   Alkaline Phosphatase 80 38 - 126 U/L   Total Bilirubin 0.6 0.3 - 1.2 mg/dL   GFR calc non Af Amer >60 >60 mL/min   GFR calc Af Amer >60 >60 mL/min    Comment: (NOTE) The eGFR has been calculated using the CKD EPI equation. This calculation has not been validated in all clinical situations. eGFR's persistently <60 mL/min signify possible Chronic Kidney Disease.    Anion gap 7 5 - 15  Magnesium     Status: Abnormal   Collection Time: 08/28/15  4:30 AM  Result Value Ref Range   Magnesium 1.3 (L) 1.7 - 2.4 mg/dL  Glucose, capillary     Status: None   Collection Time: 08/28/15  6:26 AM  Result Value Ref Range   Glucose-Capillary 95 65 - 99 mg/dL   Comment 1 Notify RN   Hemoglobin A1c     Status: None   Collection Time: 08/28/15  8:00 AM  Result Value Ref Range   Hgb A1c MFr Bld 4.9 4.8 - 5.6 %    Comment: (NOTE)         Pre-diabetes: 5.7 - 6.4         Diabetes: >6.4         Glycemic control for adults with diabetes: <7.0    Mean Plasma Glucose 94 mg/dL    Comment: (NOTE) Performed At: [facility] [place], [place] [id number] Lindon Romp MD OE:[id number]   Glucose, capillary     Status: Abnormal   Collection Time: 08/28/15 11:59 AM  Result Value Ref Range   Glucose-Capillary 114 (H) 65 - 99 mg/dL   Comment 1 Notify RN   Glucose, capillary     Status: Abnormal   Collection Time: 08/28/15  4:56 PM  Result Value Ref Range   Glucose-Capillary 113 (H) 65 - 99 mg/dL   Comment 1 Notify RN   Glucose, capillary     Status: Abnormal   Collection Time: 08/28/15  8:35 PM  Result Value Ref  Range   Glucose-Capillary 118 (H) 65 - 99 mg/dL  Protime-INR     Status: Abnormal   Collection Time: 08/29/15  5:02 AM  Result Value Ref Range   Prothrombin Time 25.2 (H) 11.6 - 15.2 seconds   INR 2.32 (H) 0.00 - 1.54  Basic metabolic panel     Status: Abnormal   Collection Time: 08/29/15  5:02 AM  Result Value Ref Range   Sodium 135 135 - 145 mmol/L   Potassium 3.6 3.5 - 5.1 mmol/L   Chloride 97 (L) 101 - 111 mmol/L   CO2 31 22 - 32 mmol/L   Glucose, Bld 99 65 - 99 mg/dL   BUN 9 6 - 20 mg/dL   Creatinine, Ser 0.35 (L) 0.61 - 1.24 mg/dL   Calcium 8.0 (L) 8.9 - 10.3 mg/dL   GFR calc non Af Amer >60 >60 mL/min   GFR calc Af Amer >60 >60 mL/min    Comment: (NOTE) The eGFR has been calculated using the CKD EPI equation. This calculation has not been validated in all clinical situations. eGFR's persistently <60 mL/min signify possible Chronic Kidney Disease.    Anion gap 7 5 - 15  Glucose, capillary     Status: None   Collection Time: 08/29/15  6:46 AM  Result Value Ref Range   Glucose-Capillary 85 65 - 99 mg/dL   Comment 1 Notify RN   Glucose, capillary     Status: Abnormal   Collection Time: 08/29/15 11:40 AM  Result Value Ref Range   Glucose-Capillary 135 (H) 65 - 99 mg/dL   Comment 1 Notify RN   Magnesium     Status: Abnormal   Collection Time: 08/29/15 12:44 PM  Result Value Ref Range   Magnesium 1.5 (L) 1.7 - 2.4 mg/dL  Glucose, capillary     Status: Abnormal   Collection Time: 08/29/15  4:41 PM  Result Value Ref Range   Glucose-Capillary 110 (H) 65 - 99 mg/dL   Comment 1 Notify RN   Glucose, capillary  Status: Abnormal   Collection Time: 08/29/15  8:38 PM  Result Value Ref Range   Glucose-Capillary 133 (H) 65 - 99 mg/dL   Comment 1 Notify RN   Protime-INR     Status: Abnormal   Collection Time: 08/30/15  4:40 AM  Result Value Ref Range   Prothrombin Time 25.1 (H) 11.6 - 15.2 seconds   INR 2.30 (H) 0.00 - 1.49  Glucose, capillary     Status: None    Collection Time: 08/30/15  7:21 AM  Result Value Ref Range   Glucose-Capillary 87 65 - 99 mg/dL    Nursing note and vitals reviewed. Constitutional: He is oriented to person, place, and time.he is slower today He appears well-developed and well-nourished. No distress.  HENT:  Head: Normocephalic and atraumatic.  Mouth/Throat: Oropharynx is clear and moist.  Eyes: Conjunctivae and EOM are normal. Pupils are equal, round, and reactive to light.  Neck: Decreased range of motion present.  Trach site with dry dressing and ATC in place.  Cardiovascular: Normal rate and regular rhythm. noES Respiratory: Effort normal and breath sounds normal. No respiratory distress. He has no wheezes. He exhibits no tenderness.  Sternotomy incision is clean,dry and intact--healing well.  GI: Soft. Bowel sounds are normal.  Midline incision has healed to surface but not epithealized yet. Ostomy with bilious drainage. Multiple drains LLQ sutured in place and mild irritation at entry site.  Genitourinary:  Min scrotal edema noted. Foley in place.  Musculoskeletal: He exhibits edema (LLE with 1+ edema tibially and 2+ pedally. RLE with 1+ edema pedally. ). He exhibits no tenderness.  Right knee with 1-2+ effusion. No tenderness with ROM. Tends to keep RLE rotated outward.  Neurological: He is alert and oriented to person, place, and time.  Able to phonate well with PMSV. Speech clear. Follows basic commands without difficulty.  Skin: Skin is warm and dry. He is not diaphoretic.  Right lower shin with stasis changes. Multiple ulcers on bilateral heels,the right heel blister is partially hemorrhagic, left heel has a small eschar right foot, left thigh covered with foam and opsite. Psychiatric: He has a normal mood and affect. His speech is normal and behavior is normal. Cognition and memory are normal.  Motor strength is 3 minus bilateral deltoid 4 minus bicep tricep grip, 2 minus bilateral hip  flexors 3 minus knee extensors 3 minus bilateral ankle dorsiflexor plantar flexor Sensation is absent in bilateral feet to light touch. Present at the knees Fingers has some difficulty identifying light touch    Assessment/Plan: 1. 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 Physiatrist is providing close team supervision and 24 hour management of active medical problems listed below. Physiatrist and rehab team continue to assess barriers to discharge/monitor patient progress toward functional and medical goals. FIM: Function - Bathing Position: Sitting EOB Body parts bathed by patient: Right arm, Left arm, Chest, Abdomen, Right upper leg, Left upper leg Body parts bathed by helper: Back, Left lower leg, Right lower leg Bathing not applicable: Front perineal area, Buttocks (RN just cleaned and changed dressing on buttocks) Assist Level: 2 helpers  Function- Upper Body Dressing/Undressing What is the patient wearing?: Pull over shirt/dress Pull over shirt/dress - Perfomed by helper: Thread/unthread right sleeve, Thread/unthread left sleeve, Put head through opening, Pull shirt over trunk Assist Level: 2 helpers Function - Lower Body Dressing/Undressing What is the  patient wearing?: Non-skid slipper socks Non-skid slipper socks- Performed by helper: Don/doff right sock, Don/doff left sock Assist for footwear: Dependant Assist for lower body dressing: 2 Helpers  Function - Toileting Toileting activity did not occur: Safety/medical concerns  Function - Air cabin crew transfer activity did not occur: Safety/medical concerns (ileostomy/foley)  Function - Chair/bed transfer Chair/bed transfer activity did not occur: Safety/medical concerns Chair/bed transfer method: Lateral scoot Chair/bed transfer  assist level: 2 helpers Chair/bed transfer assistive device: Sliding board  Function - Locomotion: Wheelchair Will patient use wheelchair at discharge?: Yes Type: Manual Wheelchair activity did not occur: Safety/medical concerns Max wheelchair distance: 40 Assist Level: Maximal assistance (Pt 25 - 49%) Wheel 50 feet with 2 turns activity did not occur: Safety/medical concerns Wheel 150 feet activity did not occur: Safety/medical concerns Function - Locomotion: Ambulation Ambulation activity did not occur: Safety/medical concerns Walk 10 feet activity did not occur: Safety/medical concerns Walk 50 feet with 2 turns activity did not occur: Safety/medical concerns Walk 150 feet activity did not occur: Safety/medical concerns Walk 10 feet on uneven surfaces activity did not occur: Safety/medical concerns  Function - Comprehension Comprehension: Auditory Comprehension assist level: Understands basic 75 - 89% of the time/ requires cueing 10 - 24% of the time  Function - Expression Expression: Verbal Expression assistive device: Talk trach valve Expression assist level: Expresses basic 90% of the time/requires cueing < 10% of the time.  Function - Social Interaction Social Interaction assist level: Interacts appropriately 75 - 89% of the time - Needs redirection for appropriate language or to initiate interaction.  Function - Problem Solving Problem solving assist level: Solves basic 50 - 74% of the time/requires cueing 25 - 49% of the time  Function - Memory Memory assist level: Recognizes or recalls 50 - 74% of the time/requires cueing 25 - 49% of the time Patient normally able to recall (first 3 days only): Current season, Location of own room, Staff names and faces, That he or she is in a hospital  1. 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 LLE DVT treatment /Anticoagulation: Pharmaceutical: Coumadin 3. Pain Management: Tylenol for prn for pain. Will continue oxycodone prn if needed for severe pain. 4. Mood: LCSW to follow for evaluation and support.  5. Neuropsych: This patient is capable of making decisions on his own behalf. 6. Skin/Wound Care: Will consult WOC for input on sacral wound as well as multiple healing ulcers. Will add Vit C and Zinc to help promote healing.general surgery for sacral wound debridement, PR AFO ordered for heels, await air bed which has been ordered  7. Fluids/Electrolytes/Nutrition: Monitor I/O. Will consult dietician to help with food choices. Increase prosource to TID. Added ensure between meals as unable to tolerate a lot of food at one time.  8. Abdominal abscess: Continue drain care bid. On Daptomycin --question duration. Flagyl to contiue till 10/26 per records. ID on consult, may have repeat CT of the abdomen to see if any of the drains can be discontinued. 9. Hypokalemia: Improved with supplement in past 24 hours.still requires additional oral potassium  10. VDRF/ History of OSA: To continue current trach till mobility improves per CCM. Tolerating humidified air. We will need to reconsult pulmonary before decannulation 11. Anxiety disorder: Will resume low dose Xanax as used this for insomnia at home. given his  respiratory compromise will need to limit dosing 12. Anemia: Likely due to infection/chronic illness. Hgb stable at 8.2 13. Anasarca: Improved with diuresis. Continue Lasix.  14. Diastolic CHF: Off amiodarone, Ace and statin at this time due to recurrent hypotension and multiple medical issues. Monitor for signs of overload. Check daily weights. Monitor BP bid.  15.  Hypomagnesemia-labs personally reviewed. increase supplement, repeat Mg level on 10/17 LOS (Days) 3 A FACE TO FACE EVALUATION WAS  PERFORMED  KIRSTEINS,ANDREW E 08/30/2015, 11:00 AM

## 2015-08-30 NOTE — IPOC Note (Signed)
Overall Plan of Care [facility]) Patient Details Name: Jonathan Le MRN: [id number] DOB: 1946-11-03  Admitting Diagnosis: debility trach   Hospital Problems: Principal Problem:   Critical illness myopathy Active Problems:   Debility   Intra-abdominal abscess (HCC)   Critical illness neuropathy (HCC)     Functional Problem List: Nursing Bladder, Bowel, Edema, Endurance, Medication Management, Motor, Nutrition, Pain, Perception, Sensory, Skin Integrity  PT Balance, Pain, Edema, Safety, Sensory, Endurance, Motor, Skin Integrity  OT Balance, Cognition, Edema, Endurance, Motor, Pain, Safety, Skin Integrity  SLP Cognition  TR         Basic ADL's: OT Grooming, Bathing, Dressing, Toileting     Advanced  ADL's: OT       Transfers: PT Bed Mobility, Bed to Chair, Customer service manager, Tub/Shower     Locomotion: PT Ambulation, Wheelchair Mobility     Additional Impairments: OT None  SLP Social Cognition   Problem Solving, Memory, Awareness  TR      Anticipated Outcomes Item Anticipated Outcome  Self Feeding No goal  Swallowing      Basic self-care  Min assist  Toileting  Min assist   Bathroom Transfers Mod assist  Bowel/Bladder  Patient perform ostomy care with Min assist; cont of bladder with mod assist  Transfers  modA   Locomotion  gait maxA with therapy, supervision w/c home environment, minA community  Communication  Mod I to return demonstration of PMSV care  Cognition  Supervision   Pain  <4 on 0-10 pain scale   Safety/Judgment  Mod assist   Therapy Plan: PT Intensity: Minimum of 1-2 x/day ,45 to 90 minutes PT Frequency: Total of 15 hours over 7 days of combined therapies PT Duration Estimated Length of Stay: 4 weeks OT Intensity: Minimum of 1-2 x/day, 45 to 90 minutes OT Frequency: Total of 15 hours over 7 days of combined therapies OT Duration/Estimated Length of Stay: 4 weeks SLP Intensity: Minumum of 1-2 x/day, 30 to 90 minutes SLP Frequency: Total  of 15 hours over 7 days of combined therapies SLP Duration/Estimated Length of Stay: 14-21 days        Team Interventions: Nursing Interventions Patient/Family Education, Bladder Management, Bowel Management, Disease Management/Prevention, Pain Management, Medication Management, Skin Care/Wound Management, Discharge Planning, Psychosocial Support, Cognitive Remediation/Compensation  PT interventions Ambulation/gait training, Balance/vestibular training, Discharge planning, Disease management/prevention, DME/adaptive equipment instruction, Functional mobility training, Neuromuscular re-education, Patient/family education, Pain management, Psychosocial support, Stair training, Skin care/wound management, Splinting/orthotics, Therapeutic Activities, Therapeutic Exercise, UE/LE Coordination activities, UE/LE Strength taining/ROM, Wheelchair propulsion/positioning  OT Interventions Balance/vestibular training, Cognitive remediation/compensation, Discharge planning, Disease mangement/prevention, DME/adaptive equipment instruction, Functional mobility training, Neuromuscular re-education, Pain management, Patient/family education, Psychosocial support, Self Care/advanced ADL retraining, Skin care/wound managment, Splinting/orthotics, Therapeutic Activities, Therapeutic Exercise, UE/LE Strength taining/ROM, UE/LE Coordination activities, Wheelchair propulsion/positioning  SLP Interventions Cognitive remediation/compensation, Cueing hierarchy, Patient/family education, Speech/Language facilitation, Internal/external aids, Functional tasks  TR Interventions    SW/CM Interventions      Team Discharge Planning: Destination: PT-Home ,OT- Home , SLP-Home Projected Follow-up: PT-Home health PT, OT-  Home health OT, 24 hour supervision/assistance, SLP- (TBD pending progress made while inpatient) Projected Equipment Needs: PT-To be determined, OT- 3 in 1 bedside comode, To be determined, SLP-To be  determined Equipment Details: PT- , OT-  Patient/family involved in discharge planning: PT- Patient, Family member/caregiver,  OT-Patient, SLP-Patient  MD ELOS: 25-28d Medical Rehab Prognosis:  Fair Assessment: A 69 year old male who was admitted for ischemic bowel and developed sepsis with multiple abdominal abscesses.  He now is on the rehabilitation unit for aggressive rehabilitation after being transferred from the long-term care hospital. 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 B12 level is pending  Now requiring 24/7 Rehab RN,MD, as well as CIR level PT, OT and SLP.  Treatment team will focus on ADLs and mobility [facility]    See Team Conference Notes for weekly updates to the plan of care

## 2015-08-31 ENCOUNTER — Inpatient Hospital Stay (HOSPITAL_COMMUNITY): Payer: Medicare Other

## 2015-08-31 ENCOUNTER — Inpatient Hospital Stay (HOSPITAL_COMMUNITY): Payer: Medicare Other | Admitting: Occupational Therapy

## 2015-08-31 ENCOUNTER — Inpatient Hospital Stay (HOSPITAL_COMMUNITY): Payer: Medicare Other | Admitting: Speech Pathology

## 2015-08-31 DIAGNOSIS — E46 Unspecified protein-calorie malnutrition: Secondary | ICD-10-CM

## 2015-08-31 DIAGNOSIS — L89159 Pressure ulcer of sacral region, unspecified stage: Secondary | ICD-10-CM

## 2015-08-31 DIAGNOSIS — K651 Peritoneal abscess: Secondary | ICD-10-CM | POA: Diagnosis present

## 2015-08-31 DIAGNOSIS — L89154 Pressure ulcer of sacral region, stage 4: Secondary | ICD-10-CM

## 2015-08-31 DIAGNOSIS — B952 Enterococcus as the cause of diseases classified elsewhere: Secondary | ICD-10-CM

## 2015-08-31 LAB — GLUCOSE, CAPILLARY
GLUCOSE-CAPILLARY: 96 mg/dL (ref 65–99)
GLUCOSE-CAPILLARY: 100 mg/dL — AB (ref 65–99)
Glucose-Capillary: 101 mg/dL — ABNORMAL HIGH (ref 65–99)
Glucose-Capillary: 114 mg/dL — ABNORMAL HIGH (ref 65–99)

## 2015-08-31 LAB — PROTIME-INR
INR: 1.93 — AB (ref 0.00–1.49)
PROTHROMBIN TIME: 22 s — AB (ref 11.6–15.2)

## 2015-08-31 LAB — BASIC METABOLIC PANEL
Anion gap: 5 (ref 5–15)
BUN: 10 mg/dL (ref 6–20)
CO2: 32 mmol/L (ref 22–32)
CREATININE: 0.33 mg/dL — AB (ref 0.61–1.24)
Calcium: 8.1 mg/dL — ABNORMAL LOW (ref 8.9–10.3)
Chloride: 98 mmol/L — ABNORMAL LOW (ref 101–111)
GFR calc Af Amer: >60 mL/min (ref >60)
Glucose, Bld: 94 mg/dL (ref 65–99)
Potassium: 4 mmol/L (ref 3.5–5.1)
SODIUM: 135 mmol/L (ref 135–145)

## 2015-08-31 MED ORDER — PATIENT'S GUIDE TO USING COUMADIN BOOK
Freq: Once | Status: AC
  Administered 2015-08-31: 20:00:00
  Filled 2015-08-31: qty 1

## 2015-08-31 MED ORDER — WARFARIN SODIUM 4 MG PO TABS
4.0000 mg | ORAL_TABLET | Freq: Every day | ORAL | Status: DC
  Administered 2015-08-31: 4 mg via ORAL
  Filled 2015-08-31 (×2): qty 1

## 2015-08-31 MED ORDER — WARFARIN VIDEO
Freq: Once | Status: AC
  Administered 2015-08-31: 20:00:00

## 2015-08-31 MED ORDER — IOHEXOL 300 MG/ML  SOLN
25.0000 mL | INTRAMUSCULAR | Status: AC
  Administered 2015-08-31 (×2): 25 mL via ORAL

## 2015-08-31 NOTE — Progress Notes (Signed)
Physical Therapy Session Note  Patient Details  Name: Jonathan Le MRN: [id number] Date of Birth: 04/26/1946  Today's Date: 08/31/2015 PT Individual Time: 0900-1000 PT Individual Time Calculation (min): 60 min   Short Term Goals: Week 1:  PT Short Term Goal 1 (Week 1): Will perform supine <>sit with maxA +1 and bedrails PT Short Term Goal 2 (Week 1): Will perform sitting balance on EOB x10 min with no UE support while performing functional task PT Short Term Goal 3 (Week 1): Will perform sit <>stand with maxA +2 PT Short Term Goal 4 (Week 1): Will perform w/c propulsion x100' with modA  Skilled Therapeutic Interventions/Progress Updates:   Pain bil buttocks due to DQs; pt did not rate. +2 assist for multiple sit><partial stands from raised bed.  Pt unable to fully extend hips, but did left hips off bed a few inches several times, with pt's arms over therapist's and tech's shoulders.  Therapeutic exercise performed with LE to increase strength for functional mobility, in sitting EOB: 10 x 1 long arc knee ext, bil hip abd,add,ext against manual resistance, ankle PF, ankle DF.  Pt tolerated sitting EOB x 45 minutes, with O2 sats 95% dropping to 80%, and HR 123 with sit> partial stands.   Sats rose in less than 1 minute with cueing for slow breathing.  Pt assisted to supine in bed, awaiting hydrotherapy.      Therapy Documentation Precautions:  Precautions Precautions: Fall Restrictions Weight Bearing Restrictions: No Other Position/Activity Restrictions: trach collar       See Function Navigator for Current Functional Status.   Therapy/Group: Individual Therapy  Natasha Paulson 08/31/2015, 11:26 AM

## 2015-08-31 NOTE — Progress Notes (Signed)
[facility]    Subjective: No new complaints   Antibiotics:  Anti-infectives    Start     Dose/Rate Route Frequency Ordered Stop   08/27/15 2200  meropenem (MERREM) 1 g in sodium chloride 0.9 % 100 mL IVPB     1 g 200 mL/hr over 30 Minutes Intravenous 3 times per day 08/27/15 1658 09/03/15 2359   08/27/15 1615  metroNIDAZOLE (FLAGYL) tablet 500 mg  Status:  Discontinued     500 mg Oral 3 times per day 08/27/15 1610 08/28/15 1651      Medications: Scheduled Meds: . antiseptic oral rinse  15 mL Mouth Rinse TID PC  . collagenase   Topical BID  . famotidine  20 mg Oral BID  . feeding supplement (ENSURE ENLIVE)  237 mL Oral BID BM  . feeding supplement (PRO-STAT SUGAR FREE 64)  30 mL Oral TID  . furosemide  60 mg Oral Daily  . hydrocerin   Topical BID  . insulin aspart  0-5 Units Subcutaneous QHS  . insulin aspart  0-9 Units Subcutaneous TID WC  . magnesium oxide  800 mg Oral BID  . meropenem (MERREM) IV  1 g Intravenous 3 times per day  . multivitamin with minerals  1 tablet Oral Daily  . potassium chloride  20 mEq Oral TID  . vitamin C  500 mg Oral BID  . warfarin  4 mg Oral q1800  . Warfarin - Pharmacist Dosing Inpatient   Does not apply q1800   Continuous Infusions:  PRN Meds:.ALPRAZolam, alum & mag hydroxide-simeth, bisacodyl, diphenhydrAMINE, guaiFENesin-dextromethorphan, ipratropium-albuterol, ondansetron **OR** ondansetron (ZOFRAN) IV, oxyCODONE, sodium chloride, sodium phosphate, traZODone    Objective: Weight change:   Intake/Output Summary (Last 24 hours) at 08/31/15 1442 Last data filed at 08/31/15 0526  Gross per 24 hour  Intake    290 ml  Output   1320 ml  Net  -1030 ml   Blood pressure 109/65, pulse 100, temperature 97.7 F (36.5 C), temperature source Oral, resp. rate 18, height  (1.753 m), weight 253 lb 9.6 oz (115.032 kg), SpO2 94 %. Temp:  [97.4 F (36.3 C)-97.7 F (36.5 C)] 97.7 F (36.5 C)  (10/17 0506) Pulse Rate:  [90-112] 100 (10/17 1252) Resp:  [16-18] 18 (10/17 1252) BP: (92-109)/(53-65) 109/65 mmHg (10/17 0532) SpO2:  [92 %-99 %] 94 % (10/17 1252) FiO2 (%):  [28 %] 28 % (10/17 1252)  Physical Exam: General: Alert and awake, oriented x3, not in any acute distress. HEENT: anicteric sclera, EOMI CVS regular rate, normal r,  no murmur rubs or gallops Chest: clear to auscultation bilaterally, no wheezing, rales or rhonchi Abdomen: soft nontender, nondistended, normal bowel sounds, several JP drains in place Extremities: no  clubbing or edema noted bilaterally Skin: venous stasis changes in bilateral lower extremities decubitus ulcers not examined today  Neuro: global weakness  CBC: CBC Latest Ref Rng 08/28/2015 08/20/2015 08/16/2015  WBC 4.0 - 10.5 K/uL 6.6 5.2 9.2  Hemoglobin 13.0 - 17.0 g/dL 8.2(L) 8.8(L) 7.5(L)  Hematocrit 39.0 - 52.0 % 26.9(L) 29.3(L) 25.6(L)  Platelets 150 - 400 K/uL 349 328 387       BMET  Recent Labs  08/29/15 0502 08/31/15 0445  NA 135 135  K 3.6 4.0  CL 97* 98*  CO2 31 32  GLUCOSE 99 94  BUN 9 10  CREATININE 0.35* 0.33*  CALCIUM 8.0* 8.1*  Liver Panel  No results for input(s): PROT, ALBUMIN, AST, ALT, ALKPHOS, BILITOT, BILIDIR, IBILI in the last 72 hours.     Sedimentation Rate No results for input(s): ESRSEDRATE in the last 72 hours. C-Reactive Protein No results for input(s): CRP in the last 72 hours.  Micro Results: Recent Results (from the past 720 hour(s))  Culture, blood (routine x 2)     Status: None   Collection Time: 08/05/15  1:47 PM  Result Value Ref Range Status   Specimen Description BLOOD PICC LINE  Final   Special Requests BOTTLES DRAWN AEROBIC AND ANAEROBIC 10CC  Final   Culture NO GROWTH 5 DAYS  Final   Report Status 08/10/2015 FINAL  Final  Culture, blood (routine x 2)     Status: None   Collection Time: 08/05/15  1:55 PM  Result Value Ref Range Status   Specimen Description BLOOD LEFT  HAND  Final   Special Requests BOTTLES DRAWN AEROBIC ONLY 10CC  Final   Culture NO GROWTH 5 DAYS  Final   Report Status 08/10/2015 FINAL  Final  C difficile quick scan w PCR reflex     Status: None   Collection Time: 08/05/15  2:04 PM  Result Value Ref Range Status   C Diff antigen NEGATIVE NEGATIVE Final   C Diff toxin NEGATIVE NEGATIVE Final   C Diff interpretation Negative for toxigenic C. difficile  Final  Stat Gram stain     Status: None   Collection Time: 08/05/15  4:30 PM  Result Value Ref Range Status   Specimen Description SPUTUM  Final   Special Requests NONE  Final   Gram Stain   Final    RARE WBC PRESENT, PREDOMINANTLY MONONUCLEAR RARE GRAM POSITIVE COCCI IN PAIRS Gram Stain Report Called to,Read Back By and Verified WithArminda Resides RN 1815 08/05/15 A BROWNING    Report Status 08/05/2015 FINAL  Final  Culture, respiratory (NON-Expectorated)     Status: None   Collection Time: 08/05/15  4:30 PM  Result Value Ref Range Status   Specimen Description TRACHEAL ASPIRATE  Final   Special Requests NONE  Final   Gram Stain   Final    MODERATE WBC PRESENT,BOTH PMN AND MONONUCLEAR RARE SQUAMOUS EPITHELIAL CELLS PRESENT RARE GRAM POSITIVE COCCI IN PAIRS Performed at [facility]    Culture   Final    Non-Pathogenic Oropharyngeal-type Flora Isolated. Performed at [facility]    Report Status 08/08/2015 FINAL  Final  Culture, Urine     Status: None   Collection Time: 08/05/15  6:21 PM  Result Value Ref Range Status   Specimen Description URINE, RANDOM  Final   Special Requests NONE  Final   Culture NO GROWTH 2 DAYS  Final   Report Status 08/07/2015 FINAL  Final  Stat Gram stain     Status: None   Collection Time: 08/05/15  6:21 PM  Result Value Ref Range Status   Specimen Description URINE, RANDOM  Final   Special Requests NONE  Final   Gram Stain   Final    WBC PRESENT, PREDOMINANTLY MONONUCLEAR NO ORGANISMS SEEN CYTOSPIN    Report Status  08/05/2015 FINAL  Final    Studies/Results: No results found.    Assessment/Plan:  INTERVAL HISTORY:  08/31/15: pt getting ready for repeat CT scan, CCS satisfied with progress with sacral wound   Principal Problem:   Critical illness myopathy Active Problems:   Debility   Intra-abdominal abscess (HCC)   Critical  illness neuropathy (HCC)   Enterococcal infection   Sacral decubitus ulcer   Hypoalbuminemia due to protein-calorie malnutrition (HCC)    Jonathan Le is a 69 y.o. male with  complicated recent hospital course including intra-abdominal abscesses status post drainage of a care myopathy decubitus ulcers  #1 intra-abdominal abscesses: Await repeat CT scan. Consider narrowing down to Unasyn since only enterococcus was isolated on culture previously.  #2 decubitus ulcer will examine wounds tomorrow when he has dressing change as he was just getting ready to go for CT scan     LOS: 4 days   Acey LavCornelius Van Dam 08/31/2015, 2:42 PM

## 2015-08-31 NOTE — Progress Notes (Signed)
Social Work  Social Work Assessment and Plan  Patient Details  Name: Jonathan Le MRN: [id number] Date of Birth: 15-Jul-1946  Today's Date: 08/31/2015  Problem List:  Patient Active Problem List   Diagnosis Date Noted  . Enterococcal infection   . Sacral decubitus ulcer   . Hypoalbuminemia due to protein-calorie malnutrition (HCC)   . 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 Intra-abdominal abscess (HCC)   . Debility 08/27/2015  . Acute on chronic respiratory failure, unspecified whether with hypoxia or hypercapnia (HCC)   . Encounter for orogastric (OG) tube placement   . Abdominal abscess (HCC)   . Abscess   . Encounter for central line placement   . Encounter for intubation   . Respiratory failure (HCC)   . Septic shock (HCC)   . Blood poisoning (HCC)   . Tracheostomy status (HCC)   . Acute respiratory failure (HCC)   . Pressure ulcer 06/21/2015  . Shock (HCC) 06/18/2015  . Abdominal pain, generalized   . Shortness of breath   . Hypoxia   . 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 Cardiomyopathy, ischemic 05/21/2015  . CAD, multiple vessel 05/21/2015  . Acute systolic congestive heart failure, NYHA class 3 (HCC) 05/21/2015  . Morbid obesity (HCC) 05/20/2015  . OSA on CPAP 05/20/2015  . Other specified hypotension   . NSTEMI (non-ST elevated myocardial infarction) (HCC) 05/19/2015  . Benign prostatic hyperplasia with urinary obstruction 04/24/2015  . Chronic LBP 12/05/2013  . Essential hypertension 09/04/2013   Past Medical History:  Past Medical History  Diagnosis Date  . Anxiety   . Hypertension   . Arthritis   . Sleep apnea     wears CPAP nightly  . Glaucoma   . CHF (congestive heart failure) (HCC)     MI, with open heart surgery x 30 sdays ago per pt. (06/26/15)  . S/P CABG (coronary artery bypass graft)    Past Surgical History:  Past Surgical History  Procedure Laterality Date  . Hernia repair      right  . Eye  surgery      bil cataract  . Wisdom tooth extraction    . Fasciotomy foot / toe Left     foot  . Cardiac catheterization N/A 05/20/2015    Procedure: Left Heart Cath and Coronary Angiography;  Surgeon: Kathleene Hazel, MD;  Location: [facility] INVASIVE CV LAB;  Service: Cardiovascular;  Laterality: N/A;  . Coronary artery bypass graft N/A 05/22/2015    Procedure: CORONARY ARTERY BYPASS GRAFTING (CABG), ON PUMP, TIMES THREE, USING LEFT INTERNAL MAMMARY ARTERY, RIGHT GREATER SAPHENOUS VEIN HARVESTED ENDOSCOPICALLY;  Surgeon: Kerin Perna, MD;  Location: [facility] OR;  Service: Open Heart Surgery;  Laterality: N/A;  LIMA-LAD, SVG-OM, SVG-PD  . Tee without cardioversion N/A 05/22/2015    Procedure: TRANSESOPHAGEAL ECHOCARDIOGRAM (TEE);  Surgeon: Kerin Perna, MD;  Location: [facility] OR;  Service: Open Heart Surgery;  Laterality: N/A;  . Laparotomy N/A 06/18/2015    Procedure: EXPLORATORY LAPAROTOMY;  Surgeon: Emelia Loron, MD;  Location: [facility] OR;  Service: General;  Laterality: N/A;  . Laparotomy N/A 06/20/2015    Procedure: WASHOUT OF ABDOMEN, CLOSURE OF ABDOMINAL WALL WITH PLACEMENT OF WOUND VAC AND ILLEOSTOMY ;  Surgeon: Almond Lint, MD;  Location: [facility] OR;  Service: General;  Laterality: N/A;  . Tracheostomy tube placement N/A 07/03/2015    Procedure: TRACHEOSTOMY;  Surgeon: Serena Colonel, MD;  [facility] OR;  Service: ENT;  Laterality: N/A;  Social History:  reports that he has never smoked. He has never used smokeless tobacco. He reports that he does not drink alcohol or use illicit drugs.  Family / Support Systems Marital Status: Married How Long?: 15 years Patient Roles: Spouse, Parent Spouse/Significant Other: Ronda [phone] Children: Two children-[place] & [place] Other Supports: Karie Georges  [phone] Anticipated Caregiver: Wife Ability/Limitations of Caregiver: Wife is in fairly good health but can not provide much physical assistance Caregiver Availability: Other (Comment) (Dependent  upon pt's progress here.) Family Dynamics: Pt has been married for 15 years and he has two children and wife has one child.  Pt's sister is very supportive and visits daily, she is concerned about the discharge plan and whether wife can do all of pt's care. Will see how mcuh progress pt makes while here.  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   Abuse/Neglect Physical Abuse: Denies Verbal Abuse: Denies Sexual Abuse: Denies Exploitation of patient/patient's resources: Denies Self-Neglect: Denies  Emotional Status Pt's affect, behavior adn adjustment status: Pt is motivated and wants to do well here, but is very weak and has laided in bed for over 80 days. He will do his best and is hopeful he will get to the point he can return home. It has been a long time since he has been home. He takes each day at a time and is hopeful each day will be a little better than the last. Recent Psychosocial Issues: Other health issues Pyschiatric History: History of anxiety takes medicines for this and feels it helps. Will ask neuro-psych to see since long hospitalization and for coping. He relies upon his faith and takes each day at a time.  Substance Abuse History: No issues  Patient / Family Perceptions, Expectations & Goals Pt/Family understanding of illness & functional limitations: Pt and family-wife and sister have a good understanding of his procedures and hospital course. Sister is here everyday and is a strong support of pt. He has spoken to the MD and feels he has a good understanding of his condition and plan forward. Premorbid pt/family  roles/activities: Husband, Father, grandfather, brother, retiree, etc Anticipated changes in roles/activities/participation: resume Pt/family expectations/goals: Pt states: " I want to be able to do mostly for myself, I know this is a lofty goal."  Sister states: " I hope he does well it has been a long course."   Wife states: " I hope he does well here."  [employer]: None Premorbid Home Care/DME Agencies: None Transportation available at discharge: [employer] referrals recommended: Neuropsychology, Support group (specify)  Discharge Planning Living Arrangements: Spouse/significant other Support Systems: Spouse/significant other, Children, Other relatives, Friends/neighbors Type of Residence: Private residence Insurance Resources: [employer], Media planner (specify) Building services engineer) Financial Resources: Restaurant manager, fast food Screen Referred: No Living Expenses: Lives with family Money Management: Spouse, Patient Does the patient have any problems obtaining your medications?: No Home Management: Wife Patient/Family Preliminary Plans: Return home with wif eis he is able too. It is dependent upon pt's progress here, wife is only able to do so much for him. He would need to be min assist level, otherwise he would be too much care for her.  Sister is supportive but has not offered her home or assistance.  Pt is severly deconditioned and has many medical issues, several drains in place along with a his trach. Social Work Anticipated Follow Up Needs: HH/OP, SNF, Support Group  Clinical Impression This is an unfortunate gentleman who has had multiple medical issues and a long hospitalization.  He is severely deconditioned and will require a long rehab course before returning home. His wife is supportive but has limitations Of her own.  His sister visits daily but unsure how much assist she will offer wife to provide care to brother. Hopefully his trach will be discharged  while on rehab and some of his drains, which would make his care easier. It appears pt will need to go to a NH from rehab to continue his progress and recovery, but will wait and see his progress in therapies here.  Will provide support and work on a realistic discharge plan.  Lucy Chrisupree, Loralye Loberg G 08/31/2015, 2:12 PM

## 2015-08-31 NOTE — Discharge Instructions (Signed)

## 2015-08-31 NOTE — Progress Notes (Signed)
ANTICOAGULATION CONSULT NOTE - Follow Up Consult  Pharmacy Consult for Coumadin Indication: DVT  Allergies  Allergen Reactions  . Levaquin [Levofloxacin] Nausea Only  . Penicillins Hives  . Doxycycline Rash    Patient Measurements: Height: 5\' 9"  (175.3 cm) Weight: 253 lb 9.6 oz (115.032 kg) IBW/kg (Calculated) : 70.7  Vital Signs: Temp: 97.7 F (36.5 C) (10/17 0506) Temp Source: Oral (10/17 0506) BP: 109/65 mmHg (10/17 0532) Pulse Rate: 100 (10/17 1252)  Labs:  Recent Labs  08/29/15 0502 08/30/15 0440 08/31/15 0445  LABPROT 25.2* 25.1* 22.0*  INR 2.32* 2.30* 1.93*  CREATININE 0.35*  --  0.33*    Estimated Creatinine Clearance: 107.4 mL/min (by C-G formula based on Cr of 0.33).   Assessment: 69 YOM continued on coumadin for new LLE DVT (08/05/2015). INR 2.3 > 1.93 today. No new CBC since 10/14, No bleeding noted per chart. Flagyl has been stopped by ID doctor on 10/14, anticipate higher coumadin dose to keep his INR therapeutic.   Goal of Therapy:  INR 2-3 Monitor platelets by anticoagulation protocol: Yes   Plan:  Warf 4 mg po daily Daily INR  Bayard HuggerMei Jihad Brownlow, PharmD, BCPS  Clinical Pharmacist  Pager: [phone]   08/31/2015,1:50 PM

## 2015-08-31 NOTE — Progress Notes (Signed)
Patient ID: Jonathan Le, male   DOB: 04/02/1946, 69 y.o.   MRN: [id number]    Subjective: Pt feels well today.  Still with some stingy of his decub  Objective: Vital signs in last 24 hours: Temp:  [97.4 F (36.3 C)-97.7 F (36.5 C)] 97.7 F (36.5 C) (10/17 0506) Pulse Rate:  [90-112] 94 (10/17 0736) Resp:  [15-18] 18 (10/17 0736) BP: (92-109)/(53-65) 109/65 mmHg (10/17 0532) SpO2:  [92 %-99 %] 98 % (10/17 0736) FiO2 (%):  [28 %] 28 % (10/17 0736) Last BM Date: 08/30/15  Intake/Output from previous day: 10/16 0701 - 10/17 0700 In: 980 [P.O.:900] Out: 1910 [Urine:950; Drains:60; Stool:900] Intake/Output this shift:    PE: Abd: soft, NT, ND, all 4 drains with minimal serous drainage. Skin: stage 4 sacral wound is actually cleaning up very well since Friday with just WD dressing changes.  Still with some necrotic tissue, but increase in granulation tissue.  Lab Results:  No results for input(s): WBC, HGB, HCT, PLT in the last 72 hours. BMET  Recent Labs  08/29/15 0502 08/31/15 0445  NA 135 135  K 3.6 4.0  CL 97* 98*  CO2 31 32  GLUCOSE 99 94  BUN 9 10  CREATININE 0.35* 0.33*  CALCIUM 8.0* 8.1*   PT/INR  Recent Labs  08/30/15 0440 08/31/15 0445  LABPROT 25.1* 22.0*  INR 2.30* 1.93*   CMP     Component Value Date/Time   NA 135 08/31/2015 0445   K 4.0 08/31/2015 0445   CL 98* 08/31/2015 0445   CO2 32 08/31/2015 0445   GLUCOSE 94 08/31/2015 0445   BUN 10 08/31/2015 0445   CREATININE 0.33* 08/31/2015 0445   CALCIUM 8.1* 08/31/2015 0445   PROT 5.2* 08/28/2015 0430   ALBUMIN 1.6* 08/28/2015 0430   AST 22 08/28/2015 0430   ALT 10* 08/28/2015 0430   ALKPHOS 80 08/28/2015 0430   BILITOT 0.6 08/28/2015 0430   GFRNONAA >60 08/31/2015 0445   GFRAA >60 08/31/2015 0445   Lipase     Component Value Date/Time   LIPASE 15* 05/19/2015 2140       Studies/Results: No results found.  Anti-infectives: Anti-infectives    Start     Dose/Rate Route  Frequency Ordered Stop   08/27/15 2200  meropenem (MERREM) 1 g in sodium chloride 0.9 % 100 mL IVPB     1 g 200 mL/hr over 30 Minutes Intravenous 3 times per day 08/27/15 1658 09/03/15 2359   08/27/15 1615  metroNIDAZOLE (FLAGYL) tablet 500 mg  Status:  Discontinued     500 mg Oral 3 times per day 08/27/15 1610 08/28/15 1651       Assessment/Plan 1. S/p subtotal abdominal colectomy with ileostomy and multiple intra-abdominal fluid collections, s/p perc drains -will need repeat CT scan at any point from our standpoint to evaluate and see if his drains can be removed. 2. Stage 4 sacral decubitus wound -actually cleaning up well with just santyl and BID dressing changes just since Friday, but would still benefit from hydrotherapy to exacerbate healing process.  Hopefully this will start today. -will recheck Thursday.   LOS: 4 days    Anuhea Gassner E 08/31/2015, 8:21 AM Pager: [phone]

## 2015-08-31 NOTE — Progress Notes (Signed)
Recreational Therapy Session Note  Patient Details  Name: Jonathan Le MRN: [id number] Date of Birth: 07/16/1946 Today's Date: 08/31/2015  Order received & chart reviewed. Pt placed on HOLD for TR services as pt is not yet appropriate due to low activity tolerance.  Will monitor through team for future participation. Samier Jaco 08/31/2015, 3:26 PM

## 2015-08-31 NOTE — Progress Notes (Signed)
Speech Language Pathology Daily Session Note  Patient Details  Name: Jonathan HarpsHarold D Canelo MRN: [id number] Date of Birth: 07/14/1946  Today's Date: 08/31/2015 SLP Individual Time: 1301-1400 SLP Individual Time Calculation (min): 59 min  Short Term Goals: Week 1: SLP Short Term Goal 1 (Week 1): Pt will return demonstration of how to donn/doff speaking valve over 3 targeted sessions with supervision.  SLP Short Term Goal 2 (Week 1): Pt will recall steps of how to clean speaking valve over 3 targeted sessions with supervision.  SLP Short Term Goal 3 (Week 1): Pt will utliize compensatory strategies to facilitate recall of daily information with min assist verbal cues.   SLP Short Term Goal 4 (Week 1): Pt will complete basic to semi-complex self care and/or home management tasks with min assist verbal cues for functional problem solving  SLP Short Term Goal 5 (Week 1): Pt will recognize and correct errors in the moment during functional tasks with min assist verbal cues.   Skilled Therapeutic Interventions:  Pt was seen for skilled ST targeting ongoing pt education and cognitive goals.  Pt was lethargic but agreeable to participate in ST.  SLP provided skilled education regarding proper care and cleaning of speaking valve.  Pt utilized teach back to verbalize understanding to SLP of cleaning instructions.  Pt also returned demonstration of how to properly donn and doff valve x3 with min assist faded to supervision cues with mirror visual aid.  SLP also facilitated the session with a semi-complex catalog shopping task targeting functional problem solving.  Pt was able to select appropriate items from a catalog within a given set of criteria and budget with overall min assist verbal cues for working memory of task requirements.  Suspect pt's performance today was limited by fatigue as he was premedicated with pain medications prior to SLP's arrival; however, he remained willing and motivated to participate in  therapy.  Pt was left up in bed with call bell within reach.  Continue per current plan of care.    Function:  Eating Eating                 Cognition Comprehension Comprehension assist level: Understands basic 75 - 89% of the time/ requires cueing 10 - 24% of the time  Expression Expression assistive device: Talk trach valve Expression assist level: Expresses basic 90% of the time/requires cueing < 10% of the time.  Social Interaction Social Interaction assist level: Interacts appropriately 90% of the time - Needs monitoring or encouragement for participation or interaction.  Problem Solving Problem solving assist level: Solves basic 50 - 74% of the time/requires cueing 25 - 49% of the time  Memory Memory assist level: Recognizes or recalls 75 - 89% of the time/requires cueing 10 - 24% of the time    Pain Pain Assessment Pain Assessment: No/denies pain   Therapy/Group: Individual Therapy  Marston Mccadden, Melanee SpryNicole L 08/31/2015, 3:30 PM

## 2015-08-31 NOTE — Progress Notes (Signed)
Physical Therapy Wound Treatment Patient Details  Name: Jonathan Le MRN: [id number] Date of Birth: 1946/05/08  Today's Date: 08/31/2015 Time: 6286-3817 Time Calculation (min): 22 min  Patient Active Problem List   Diagnosis Date Noted  . Abscess of abdominal cavity ([place])   . Enterococcal infection   . Sacral decubitus ulcer   . Hypoalbuminemia due to protein-calorie malnutrition ([place])   . 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 Intra-abdominal abscess ([place])   . Debility 08/27/2015  . Acute on chronic respiratory failure, unspecified whether with hypoxia or hypercapnia ([place])   . Encounter for orogastric (OG) tube placement   . Abdominal abscess ([place])   . Abscess   . Encounter for central line placement   . Encounter for intubation   . Respiratory failure ([place])   . Septic shock ([place])   . Blood poisoning ([place])   . Tracheostomy status ([place])   . Acute respiratory failure ([place])   . Pressure ulcer 06/21/2015  . Shock ([place]) 06/18/2015  . Abdominal pain, generalized   . Shortness of breath   . Hypoxia   . 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 Cardiomyopathy, ischemic 05/21/2015  . CAD, multiple vessel 05/21/2015  . Acute systolic congestive heart failure, NYHA class 3 ([place]) 05/21/2015  . Morbid obesity ([place]) 05/20/2015  . OSA on CPAP 05/20/2015  . Other specified hypotension   . NSTEMI (non-ST elevated myocardial infarction) ([place]) 05/19/2015  . Benign prostatic hyperplasia with urinary obstruction 04/24/2015  . Chronic LBP 12/05/2013  . Essential hypertension 09/04/2013   Past Medical History  Diagnosis Date  . Anxiety   . Hypertension   . Arthritis   . Sleep apnea     wears CPAP nightly  . Glaucoma   . CHF (congestive heart failure) (H[facility])     MI, with open heart surgery x 30 sdays ago per pt. (06/26/15)  . S/P CABG (coronary artery bypass graft)    Past Surgical History  Procedure Laterality Date  . Hernia repair      right   . Eye surgery      bil cataract  . Wisdom tooth extraction    . Fasciotomy foot / toe Left     foot  . Cardiac catheterization N/A 05/20/2015    Procedure: Left Heart Cath and Coronary Angiography;  Surgeon: Burnell Blanks, MD;  Location: [facility];  Service: Cardiovascular;  Laterality: N/A;  . Coronary artery bypass graft N/A 05/22/2015    Procedure: CORONARY ARTERY BYPASS GRAFTING (CABG), ON PUMP, TIMES THREE, USING LEFT INTERNAL MAMMARY ARTERY, RIGHT GREATER SAPHENOUS VEIN HARVESTED ENDOSCOPICALLY;  Surgeon: Ivin Poot, MD;  Location: [facility];  Service: Open Heart Surgery;  Laterality: N/A;  LIMA-LAD, SVG-OM, SVG-PD  . Tee without cardioversion N/A 05/22/2015    Procedure: TRANSESOPHAGEAL ECHOCARDIOGRAM (TEE);  Surgeon: Ivin Poot, MD;  Location: [facility];  Service: Open Heart Surgery;  Laterality: N/A;  . Laparotomy N/A 06/18/2015    Procedure: EXPLORATORY LAPAROTOMY;  Surgeon: Rolm Bookbinder, MD;  Location: [facility];  Service: General;  Laterality: N/A;  . Laparotomy N/A 06/20/2015    Procedure: WASHOUT OF ABDOMEN, CLOSURE OF ABDOMINAL WALL WITH PLACEMENT OF WOUND VAC AND ILLEOSTOMY ;  Surgeon: Stark Klein, MD;  Location: [facility];  Service: General;  Laterality: N/A;  . Tracheostomy tube placement N/A 07/03/2015    Procedure: TRACHEOSTOMY;  Surgeon: Izora Gala, MD;  Location: [facility];  Service: ENT;  Laterality: N/A;  Subjective  Subjective: "Is this going to hurt?" pt asking Patient and Family Stated Goals: Get better Date of Onset:  (prior to this hospitalization) Prior Treatments: I&D, dressing changes  Pain Score:    Objective  Cognition: WFL Communication: WFL Mobility: Impaired:  ROM: Impaired:  Sensation: not tested Protective sensation (10g): not tested Signs of venous insufficiency:N/A Signs of arterial insufficiency: N/A  Wound Assessment  Pressure Ulcer 07/21/15 Stage III -  Full thickness tissue loss. Subcutaneous fat may be visible but bone, tendon  or muscle are NOT exposed. (Active)  Dressing Type ABD;Barrier Film (skin prep);Gauze (Comment);Moist to dry 08/31/2015  3:51 PM  Dressing Changed;Clean;Dry;Intact 08/31/2015  3:51 PM  Dressing Change Frequency Twice a day 08/31/2015  3:51 PM  State of Healing Early/partial granulation 08/31/2015  3:51 PM  Site / Wound Assessment Pink;Red;Yellow 08/31/2015  3:51 PM  % Wound base Red or Granulating 70% 08/31/2015  3:51 PM  % Wound base Yellow 30% 08/31/2015  3:51 PM  % Wound base Black 0% 08/31/2015  3:51 PM  % Wound base Other (Comment) 0% 08/31/2015  3:51 PM  Peri-wound Assessment Intact 08/31/2015  3:51 PM  Wound Length (cm) 8.5 cm 08/31/2015  3:51 PM  Wound Width (cm) 9 cm 08/31/2015  3:51 PM  Wound Depth (cm) 5.5 cm 08/31/2015  3:51 PM  Tunneling (cm) 4 08/27/2015  5:00 PM  Margins Unattached edges (unapproximated) 08/31/2015  3:51 PM  Drainage Amount Minimal 08/31/2015  3:51 PM  Drainage Description Serosanguineous;No odor 08/31/2015  3:51 PM  Treatment Debridement (Selective);Hydrotherapy (Pulse lavage);Packing (Saline gauze) 08/31/2015  3:51 PM  Santyl applied to wound bed prior to applying dressing.    Hydrotherapy Pulsed lavage therapy - wound location: sacrum Pulsed Lavage with Suction (psi): 8 psi (4-8) Pulsed Lavage with Suction - Normal Saline Used: 1000 mL Pulsed Lavage Tip: Tip with splash shield Selective Debridement Selective Debridement - Location: sacrum Selective Debridement - Tools Used: Forceps;Scissors Selective Debridement - Tissue Removed: yellow necrotic tissue   Wound Assessment and Plan  Wound Therapy - Assess/Plan/Recommendations Wound Therapy - Clinical Statement: Pt presents to hydrotherapy with large sacral pressure sore. Can benefit from hydrotherapy to debride wound and promote healing. Wound Therapy - Functional Problem List: Pt with decr sitting tolerance due to sacral wound. Factors Delaying/Impairing Wound Healing: Immobility;Multiple medical  problems;Polypharmacy Hydrotherapy Plan: Debridement;Dressing change;Patient/family education;Pulsatile lavage with suction Wound Therapy - Frequency: 6X / week Wound Therapy - Follow Up Recommendations: Home health RN Wound Plan: See above  Wound Therapy Goals- Improve the function of patient's integumentary system by progressing the wound(s) through the phases of wound healing (inflammation - proliferation - remodeling) by: Decrease Necrotic Tissue to: 10 Decrease Necrotic Tissue - Progress: Goal set today Increase Granulation Tissue to: 90 Increase Granulation Tissue - Progress: Goal set today  Goals will be updated until maximal potential achieved or discharge criteria met.  Discharge criteria: when goals achieved, discharge from hospital, MD decision/surgical intervention, no progress towards goals, refusal/missing three consecutive treatments without notification or medical reason.  Rhys Lichty 08/31/2015, 4:03 PM [employer] PT [phone]

## 2015-08-31 NOTE — Progress Notes (Signed)
Occupational Therapy Session Note  Patient Details  Name: Jonathan Le MRN: [id number] Date of Birth: 05/05/1946  Today's Date: 08/31/2015 OT Individual Time: 1503-1530 OT Individual Time Calculation (min): 27 min    Skilled Therapeutic Interventions/Progress Updates:    Pt worked on BUE AROM/ and AAROM shoulder flexion in supine position.  He was able to demonstrate active left shoulder flexion 0-110 degrees with HOB elevated to 25 degrees.  Min assist needed for 1 set of 10 repetitions while maintaining elbow extension throughout the movement.  RUE shoulder with less AROM approximately 0-100 degrees.  Noted increased crepitus with shoulder flexion and scaption.  Therapist performed gentle anterior/posterior shoulder mobilizations on the glenohumeral joint with distraction during AAROM.  Pt able to complete 10 repetitions of shoulder flexion 0-90 degrees with min facilitation to keep the right elbow extended.  Also performed 10 repetitions for shoulder internal and shoulder external rotation with elbow flexed to 90 degrees.  Slight resistance applied to internal and external rotation as well.  Oxygen sats 92-99% on 5Ls trach collar with activity.  Pt needing min instructional cueing to maintain eyes open when performing exercises.  Pt left in bed with call button and phone within reach.    Therapy Documentation Precautions:  Precautions Precautions: Fall Precaution Comments:   Restrictions Weight Bearing Restrictions: No Other Position/Activity Restrictions: trach collar  Pain: Pain Assessment Pain Assessment: No/denies pain Faces Pain Scale: Hurts a little bit Pain Type: Acute pain Pain Location: Shoulder Pain Orientation: Right Pain Descriptors / Indicators: Discomfort Pain Onset: With Activity Pain Intervention(s): Repositioned ADL: See Function Navigator for Current Functional Status.   Therapy/Group: Individual Therapy  Derian Pfost OTR/L 08/31/2015, 5:00 PM

## 2015-08-31 NOTE — Care Management Note (Signed)
[facility] Individual Statement of Services  Patient Name:  Jonathan Le  Date:  08/31/2015  Welcome to the [facility].  Our goal is to provide you with an individualized program based on your diagnosis and situation, designed to meet your specific needs.  With this comprehensive rehabilitation program, you will be expected to participate in at least 3 hours of rehabilitation therapies Monday-Friday, with modified therapy programming on the weekends.  Your rehabilitation program will include the following services:  Physical Therapy (PT), Occupational Therapy (OT), Speech Therapy (ST), 24 hour per day rehabilitation nursing, Therapeutic Recreaction (TR), Neuropsychology, Case Management (Social Worker), Rehabilitation Medicine, Nutrition Services and Pharmacy Services  Weekly team conferences will be held on Wednesday to discuss your progress.  Your Social Worker will talk with you frequently to get your input and to update you on team discussions.  Team conferences with you and your family in attendance may also be held.  Expected length of stay: 4 weeks Overall anticipated outcome: min/mod assist level  Depending on your progress and recovery, your program may change. Your Social Worker will coordinate services and will keep you informed of any changes. Your Social Worker's name and contact numbers are listed  below.  The following services may also be recommended but are not provided by the [facility]:   Driving Evaluations  Home Health Rehabiltiation Services  Outpatient Rehabilitation Services    Arrangements will be made to provide these services after discharge if needed.  Arrangements include referral to agencies that provide these services.  Your insurance has been verified to be:  Medicare & AARP Your primary doctor is:  Elizabeth Palaueresa Anderson  Pertinent information will be shared with your doctor and your insurance  company.  Social Worker:  Dossie DerBecky Makensey Rego, SW [phone] or [phone]  Information discussed with and copy given to patient by: Lucy Chrisupree, Annebelle Bostic G, 08/31/2015, 1:43 PM

## 2015-08-31 NOTE — Progress Notes (Signed)
Occupational Therapy Session Note  Patient Details  Name: Jonathan Le MRN: [id number] Date of Birth: 05/06/1946  Today's Date: 08/31/2015 OT Individual Time: 1100-1158 OT Individual Time Calculation (min): 58 min    Short Term Goals: Week 1:  OT Short Term Goal 1 (Week 1): Pt will complete bathing with mod assist seated at EOB with assist of 1 caregiver OT Short Term Goal 2 (Week 1): Pt will complete UB dressing with mod assist and min cues for problem solving/organization OT Short Term Goal 3 (Week 1): Pt will complete LB dressing at bed level with max assist OT Short Term Goal 4 (Week 1): Pt will complete sit > stand from EOB with +2 assist to complete 1 grooming task  Skilled Therapeutic Interventions/Progress Updates:    Bathing and dressing session.  Total assist for transition to sitting with max assist for scooting to the EOB.  He was able to maintain sitting balance with bathing and UB dressing tasks with min guard assist overall.  He tolerated 5 mins EOB before requesting to lay back down secondary to pain in his sacrum and lower back.  Distracted pt with conversation while working on bathing tasks and he was able to maintain sitting for another 15 mins.  He completed 90% of UB bathing but needs assistance at each elbow in order to reach the opposite shoulder and underarm to wash.  He also completed washing his upper legs in sitting as well.  Pt needs moderate encouragement to try tasks as he will try and have therapist do it if it is difficult.   After donning shirt with mod instructional cueing he returned to supine with +2 assist to complete LB bathing and dressing.  Mod assist with mod instructional cueing to initiate and complete rolling while washing peri area and pulling pants over hips.    Therapy Documentation Precautions:  Precautions Precautions: Fall Restrictions Weight Bearing Restrictions: No Other Position/Activity Restrictions: trach collar  Vital  Signs: Therapy Vitals Pulse Rate: (!) 110 Oxygen Therapy SpO2: 92 % O2 Device: Tracheostomy Collar Pulse Oximetry Type: Continuous Pain: Pain Assessment Pain Assessment: Faces Pain Score: 8  Faces Pain Scale: Hurts even more Pain Type: Acute pain Pain Location: Buttocks Pain Orientation: Mid Pain Descriptors / Indicators: Crying;Discomfort Pain Frequency: Constant Pain Onset: On-going Patients Stated Pain Goal: 4 Pain Intervention(s): Medication (See eMAR);Repositioned;Emotional support ADL: See Function Navigator for Current Functional Status.   Therapy/Group: Individual Therapy  Terez Montee OTR/L 08/31/2015, 12:45 PM

## 2015-08-31 NOTE — Progress Notes (Addendum)
Subjective/Complaints: Pt sleeping poorly, no c/o sacral pain today Per PA , pt sundowns at ~4pm No chest pains or shortness of breath  Review systems negative for lower extremity pain, positive for numbness in feet positive for sacral pain when he lays on his back  Objective: Vital Signs: Blood pressure 109/65, pulse 94, temperature 97.7 F (36.5 C), temperature source Oral, resp. rate 18, height '5\' 9"'  (1.753 m), weight 115.032 kg (253 lb 9.6 oz), SpO2 98 %. No results found. Results for orders placed or performed during the hospital encounter of 08/27/15 (from the past 72 hour(s))  Glucose, capillary     Status: Abnormal   Collection Time: 08/28/15 11:59 AM  Result Value Ref Range   Glucose-Capillary 114 (H) 65 - 99 mg/dL   Comment 1 Notify RN   Glucose, capillary     Status: Abnormal   Collection Time: 08/28/15  4:56 PM  Result Value Ref Range   Glucose-Capillary 113 (H) 65 - 99 mg/dL   Comment 1 Notify RN   Glucose, capillary     Status: Abnormal   Collection Time: 08/28/15  8:35 PM  Result Value Ref Range   Glucose-Capillary 118 (H) 65 - 99 mg/dL  Protime-INR     Status: Abnormal   Collection Time: 08/29/15  5:02 AM  Result Value Ref Range   Prothrombin Time 25.2 (H) 11.6 - 15.2 seconds   INR 2.32 (H) 0.00 - 1.61  Basic metabolic panel     Status: Abnormal   Collection Time: 08/29/15  5:02 AM  Result Value Ref Range   Sodium 135 135 - 145 mmol/L   Potassium 3.6 3.5 - 5.1 mmol/L   Chloride 97 (L) 101 - 111 mmol/L   CO2 31 22 - 32 mmol/L   Glucose, Bld 99 65 - 99 mg/dL   BUN 9 6 - 20 mg/dL   Creatinine, Ser 0.35 (L) 0.61 - 1.24 mg/dL   Calcium 8.0 (L) 8.9 - 10.3 mg/dL   GFR calc non Af Amer >60 >60 mL/min   GFR calc Af Amer >60 >60 mL/min    Comment: (NOTE) The eGFR has been calculated using the CKD EPI equation. This calculation has not been validated in all clinical situations. eGFR's persistently <60 mL/min signify possible Chronic Kidney Disease.    Anion  gap 7 5 - 15  Glucose, capillary     Status: None   Collection Time: 08/29/15  6:46 AM  Result Value Ref Range   Glucose-Capillary 85 65 - 99 mg/dL   Comment 1 Notify RN   Glucose, capillary     Status: Abnormal   Collection Time: 08/29/15 11:40 AM  Result Value Ref Range   Glucose-Capillary 135 (H) 65 - 99 mg/dL   Comment 1 Notify RN   Magnesium     Status: Abnormal   Collection Time: 08/29/15 12:44 PM  Result Value Ref Range   Magnesium 1.5 (L) 1.7 - 2.4 mg/dL  Glucose, capillary     Status: Abnormal   Collection Time: 08/29/15  4:41 PM  Result Value Ref Range   Glucose-Capillary 110 (H) 65 - 99 mg/dL   Comment 1 Notify RN   Glucose, capillary     Status: Abnormal   Collection Time: 08/29/15  8:38 PM  Result Value Ref Range   Glucose-Capillary 133 (H) 65 - 99 mg/dL   Comment 1 Notify RN   Protime-INR     Status: Abnormal   Collection Time: 08/30/15  4:40 AM  Result Value  Ref Range   Prothrombin Time 25.1 (H) 11.6 - 15.2 seconds   INR 2.30 (H) 0.00 - 1.49  Glucose, capillary     Status: None   Collection Time: 08/30/15  7:21 AM  Result Value Ref Range   Glucose-Capillary 87 65 - 99 mg/dL  Glucose, capillary     Status: Abnormal   Collection Time: 08/30/15 11:41 AM  Result Value Ref Range   Glucose-Capillary 108 (H) 65 - 99 mg/dL   Comment 1 Notify RN   Vitamin B12     Status: None   Collection Time: 08/30/15 11:45 AM  Result Value Ref Range   Vitamin B-12 451 180 - 914 pg/mL    Comment: (NOTE) This assay is not validated for testing neonatal or myeloproliferative syndrome specimens for Vitamin B12 levels.   Magnesium     Status: Abnormal   Collection Time: 08/30/15 11:45 AM  Result Value Ref Range   Magnesium 1.5 (L) 1.7 - 2.4 mg/dL  Glucose, capillary     Status: Abnormal   Collection Time: 08/30/15  4:58 PM  Result Value Ref Range   Glucose-Capillary 110 (H) 65 - 99 mg/dL  Glucose, capillary     Status: Abnormal   Collection Time: 08/30/15  8:47 PM  Result  Value Ref Range   Glucose-Capillary 116 (H) 65 - 99 mg/dL  Protime-INR     Status: Abnormal   Collection Time: 08/31/15  4:45 AM  Result Value Ref Range   Prothrombin Time 22.0 (H) 11.6 - 15.2 seconds   INR 1.93 (H) 0.00 - 9.03  Basic metabolic panel     Status: Abnormal   Collection Time: 08/31/15  4:45 AM  Result Value Ref Range   Sodium 135 135 - 145 mmol/L   Potassium 4.0 3.5 - 5.1 mmol/L   Chloride 98 (L) 101 - 111 mmol/L   CO2 32 22 - 32 mmol/L   Glucose, Bld 94 65 - 99 mg/dL   BUN 10 6 - 20 mg/dL   Creatinine, Ser 0.33 (L) 0.61 - 1.24 mg/dL   Calcium 8.1 (L) 8.9 - 10.3 mg/dL   GFR calc non Af Amer >60 >60 mL/min   GFR calc Af Amer >60 >60 mL/min    Comment: (NOTE) The eGFR has been calculated using the CKD EPI equation. This calculation has not been validated in all clinical situations. eGFR's persistently <60 mL/min signify possible Chronic Kidney Disease.    Anion gap 5 5 - 15  Glucose, capillary     Status: None   Collection Time: 08/31/15  6:52 AM  Result Value Ref Range   Glucose-Capillary 96 65 - 99 mg/dL    Nursing note and vitals reviewed. Constitutional: He is oriented to person, place, and time.he is slower today He appears well-developed and well-nourished. No distress.  HENT:  Head: Normocephalic and atraumatic.  Mouth/Throat: Oropharynx is clear and moist.  Eyes: Conjunctivae and EOM are normal. Pupils are equal, round, and reactive to light.  Neck: Decreased range of motion present.  Trach site with dry dressing and ATC in place.  Cardiovascular: Normal rate and regular rhythm. noES Respiratory: Effort normal and breath sounds normal. No respiratory distress. He has no wheezes. He exhibits no tenderness.  Sternotomy incision is clean,dry and intact--healing well.  GI: Soft. Bowel sounds are normal.  Midline incision has healed to surface but not epithealized yet. Ostomy with bilious drainage. Multiple drains LLQ sutured in place and mild  irritation at entry site.  Genitourinary:  Min  scrotal edema noted. Foley in place.  Musculoskeletal: He exhibits edema (LLE with 1+ edema tibially and 2+ pedally. RLE with 1+ edema pedally. ). He exhibits no tenderness.  Right knee with 1-2+ effusion. No tenderness with ROM. Tends to keep RLE rotated outward.  Neurological: He is alert and oriented to person, place, and time.  Able to phonate well with PMSV. Speech clear. Follows basic commands without difficulty.  Skin: Skin is warm and dry. He is not diaphoretic.  Right lower shin with stasis changes. Multiple ulcers on bilateral heels,the right heel blister is partially hemorrhagic, left heel has a small eschar right foot, left thigh covered with foam and opsite. Psychiatric: He has a normal mood and affect. His speech is normal and behavior is normal. Cognition and memory are normal.  Motor strength is 3 minus bilateral deltoid 4 minus bicep tricep grip, 2 minus bilateral hip flexors 3 minus knee extensors 3 minus bilateral ankle dorsiflexor plantar flexor Sensation is absent in bilateral feet to light touch. Present at the knees Fingers has some difficulty identifying light touch    Assessment/Plan: 1. 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 Physiatrist is providing close team supervision and 24 hour management of active medical problems listed below. Physiatrist and rehab team continue to assess barriers to discharge/monitor patient progress toward functional and medical goals. FIM: Function - Bathing Position: Sitting EOB Body parts bathed by patient: Right arm, Left arm, Chest, Abdomen, Right upper leg, Left upper leg Body parts bathed by helper: Back, Left lower leg, Right lower leg Bathing not applicable: Front perineal area, Buttocks (RN just  cleaned and changed dressing on buttocks) Assist Level: 2 helpers  Function- Upper Body Dressing/Undressing What is the patient wearing?: Pull over shirt/dress Pull over shirt/dress - Perfomed by helper: Thread/unthread right sleeve, Thread/unthread left sleeve, Put head through opening, Pull shirt over trunk Assist Level: 2 helpers Function - Lower Body Dressing/Undressing What is the patient wearing?: Non-skid slipper socks Non-skid slipper socks- Performed by helper: Don/doff right sock, Don/doff left sock Assist for footwear: Dependant Assist for lower body dressing: 2 Helpers  Function - Toileting Toileting activity did not occur: Safety/medical concerns  Function - Air cabin crew transfer activity did not occur: Safety/medical concerns  Function - Chair/bed transfer Chair/bed transfer activity did not occur: Safety/medical concerns Chair/bed transfer method: Lateral scoot Chair/bed transfer assist level: 2 helpers Chair/bed transfer assistive device: Sliding board  Function - Locomotion: Wheelchair Will patient use wheelchair at discharge?: Yes Type: Manual Wheelchair activity did not occur: Safety/medical concerns Max wheelchair distance: 40 Assist Level: Maximal assistance (Pt 25 - 49%) Wheel 50 feet with 2 turns activity did not occur: Safety/medical concerns Wheel 150 feet activity did not occur: Safety/medical concerns Function - Locomotion: Ambulation Ambulation activity did not occur: Safety/medical concerns Walk 10 feet activity did not occur: Safety/medical concerns Walk 50 feet with 2 turns activity did not occur: Safety/medical concerns Walk 150 feet activity did not occur: Safety/medical concerns Walk 10 feet on uneven surfaces activity did not occur: Safety/medical concerns  Function - Comprehension Comprehension: Auditory Comprehension assist level: Understands basic 75 - 89% of the time/ requires cueing 10 - 24% of the time  Function -  Expression Expression: Verbal Expression assistive device: Talk trach valve Expression assist level: Expresses basic 90% of the time/requires cueing < 10% of the time.  Function - Social  Interaction Social Interaction assist level: Interacts appropriately 75 - 89% of the time - Needs redirection for appropriate language or to initiate interaction.  Function - Problem Solving Problem solving assist level: Solves basic 75 - 89% of the time/requires cueing 10 - 24% of the time  Function - Memory Memory assist level: Recognizes or recalls 50 - 74% of the time/requires cueing 25 - 49% of the time Patient normally able to recall (first 3 days only): Current season, Location of own room, Staff names and faces, That he or she is in a hospital  1. 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 LLE DVT treatment /Anticoagulation: Pharmaceutical: Coumadin 3. Pain Management: Tylenol for prn for pain. Will continue oxycodone prn if needed for severe pain. 4. Mood: LCSW to follow for evaluation and support.  5. Neuropsych: This patient is capable of making decisions on his own behalf. 6. Skin/Wound Care: Will consult WOC for input on sacral wound as well as multiple healing ulcers. Will add Vit C and Zinc to help promote healing.general surgery for sacral wound debridement, PR AFO ordered for heels, Cont air bed  7. Fluids/Electrolytes/Nutrition: Monitor I/O. Will consult dietician to help with food choices. Increase prosource to TID. Added ensure between meals as unable to tolerate a lot of food at one time.  8. Abdominal abscess: Continue drain care bid. On Daptomycin --question duration. Flagyl to contiue till 10/26 per records. ID on consult, Pt refused contrast has some improvement of at least one  abscess, ?drain removal 9. Hypokalemia: Improved with supplement in past 24 hours.still requires additional oral potassium  10. VDRF/ History of OSA: To continue current trach till mobility improves per CCM. Tolerating humidified air. We will need to reconsult pulmonary before decannulation, will wait until strength improves 11. Anxiety disorder: Will resume low dose Xanax as used this for insomnia at home. given his respiratory compromise will need to limit dosing 12. Anemia: Likely due to infection/chronic illness. Hgb stable at 8.2 13. Anasarca: Improved with diuresis. Continue Lasix.  14. Diastolic CHF: Off amiodarone, Ace and statin at this time due to recurrent hypotension and multiple medical issues. Monitor for signs of overload. Check daily weights. Monitor BP bid.  15.  Hypomagnesemia-labs personally reviewed. increase supplement to 810m BID, repeat Mg level on 10/17 LOS (Days) 4 A FACE TO FACE EVALUATION WAS PERFORMED  KIRSTEINS,ANDREW E 08/31/2015, 8:38 AM

## 2015-09-01 ENCOUNTER — Inpatient Hospital Stay (HOSPITAL_COMMUNITY): Payer: Medicare Other | Admitting: Speech Pathology

## 2015-09-01 ENCOUNTER — Ambulatory Visit (HOSPITAL_COMMUNITY): Payer: Medicare Other

## 2015-09-01 ENCOUNTER — Inpatient Hospital Stay (HOSPITAL_COMMUNITY): Payer: Medicare Other | Admitting: Occupational Therapy

## 2015-09-01 DIAGNOSIS — Z9889 Other specified postprocedural states: Secondary | ICD-10-CM

## 2015-09-01 LAB — PROTIME-INR
INR: 1.73 — ABNORMAL HIGH (ref 0.00–1.49)
Prothrombin Time: 20.3 seconds — ABNORMAL HIGH (ref 11.6–15.2)

## 2015-09-01 LAB — GLUCOSE, CAPILLARY
GLUCOSE-CAPILLARY: 78 mg/dL (ref 65–99)
GLUCOSE-CAPILLARY: 97 mg/dL (ref 65–99)
Glucose-Capillary: 128 mg/dL — ABNORMAL HIGH (ref 65–99)
Glucose-Capillary: 109 mg/dL — ABNORMAL HIGH (ref 65–99)

## 2015-09-01 LAB — MAGNESIUM: Magnesium: 1.6 mg/dL — ABNORMAL LOW (ref 1.7–2.4)

## 2015-09-01 MED ORDER — WARFARIN SODIUM 6 MG PO TABS
6.0000 mg | ORAL_TABLET | Freq: Once | ORAL | Status: AC
  Administered 2015-09-01: 6 mg via ORAL
  Filled 2015-09-01: qty 1

## 2015-09-01 MED ORDER — AMPICILLIN-SULBACTAM SODIUM 3 (2-1) G IJ SOLR
3.0000 g | Freq: Four times a day (QID) | INTRAMUSCULAR | Status: DC
  Administered 2015-09-01 – 2015-09-05 (×17): 3 g via INTRAVENOUS
  Filled 2015-09-01 (×21): qty 3

## 2015-09-01 NOTE — Progress Notes (Signed)
Physical Therapy Wound Treatment Patient Details  Name: Jonathan Le MRN: [id number] Date of Birth: 06-16-1946  Today's Date: 09/01/2015 Time: 2248-2500 Time Calculation (min): 29 min  Subjective  Subjective: Pt reports wound felt better yesterday after hydrotherapy. 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 Subcutaneous fat may be visible but bone, tendon or muscle are NOT exposed. (Active)  Dressing Type ABD;Barrier Film (skin prep);Gauze (Comment);Moist to dry 09/01/2015  1:05 PM  Dressing Changed;Clean;Dry;Intact 09/01/2015  1:05 PM  Dressing Change Frequency Twice a day 09/01/2015  1:05 PM  State of Healing Early/partial granulation 09/01/2015  1:05 PM  Site / Wound Assessment Pink;Red;Yellow 09/01/2015  1:05 PM  % Wound base Red or Granulating 65% 09/01/2015  1:05 PM  % Wound base Yellow 35% 09/01/2015  1:05 PM  % Wound base Black 0% 09/01/2015  1:05 PM  % Wound base Other (Comment) 0% 09/01/2015  1:05 PM  Peri-wound Assessment Intact 09/01/2015  1:05 PM  Wound Length (cm) 8.3 cm 09/01/2015  8:48 AM  Wound Width (cm) 9 cm 09/01/2015  8:48 AM  Wound Depth (cm) 5.5 cm 09/01/2015  8:48 AM  Tunneling (cm) 4 08/27/2015  5:00 PM  Margins Unattached edges (unapproximated) 09/01/2015  1:05 PM  Drainage Amount Minimal 09/01/2015  1:05 PM  Drainage Description Serosanguineous;No odor 09/01/2015  1:05 PM  Treatment Debridement (Selective);Hydrotherapy (Pulse lavage);Packing (Saline gauze) 09/01/2015 12:50 PM   Santyl applied to wound bed prior to applying dressing.    Hydrotherapy Pulsed lavage therapy - wound location: sacrum/buttocks Pulsed Lavage with Suction (psi): 8 psi (4-8) Pulsed Lavage with Suction - Normal Saline Used: 1000 mL Pulsed Lavage Tip: Tip with splash  shield Selective Debridement Selective Debridement - Location: sacrum/buttocks Selective Debridement - Tools Used: Forceps;Scissors Selective Debridement - Tissue Removed: yellow necrotic tissue   Wound Assessment and Plan  Wound Therapy - Assess/Plan/Recommendations Wound Therapy - Clinical Statement: Steady removal of necrotic tissue. Eventually may be able to place VAC on wound. Wound Therapy - Functional Problem List: Pt with decr sitting tolerance due to sacral wound. Factors Delaying/Impairing Wound Healing: Immobility;Multiple medical problems;Polypharmacy Hydrotherapy Plan: Debridement;Dressing change;Patient/family education;Pulsatile lavage with suction Wound Therapy - Frequency: 6X / week Wound Therapy - Follow Up Recommendations: Home health RN Wound Plan: See above  Wound Therapy Goals- Improve the function of patient's integumentary system by progressing the wound(s) through the phases of wound healing (inflammation - proliferation - remodeling) by: Decrease Necrotic Tissue to: 10 Decrease Necrotic Tissue - Progress: Progressing toward goal Increase Granulation Tissue to: 90 Increase Granulation Tissue - Progress: Progressing toward goal  Goals will be updated until maximal potential achieved or discharge criteria met.  Discharge criteria: when goals achieved, discharge from hospital, MD decision/surgical intervention, no progress towards goals, refusal/missing three consecutive treatments without notification or medical reason.  GP     Kaitlyn Franko 09/01/2015, 5:01 PM

## 2015-09-01 NOTE — Progress Notes (Signed)
Speech Language Pathology Daily Session Note  Patient Details  Name: Jonathan Le MRN: [id number] Date of Birth: 04/23/1946  Today's Date: 09/01/2015 SLP Individual Time: 1004-1030 SLP Individual Time Calculation (min): 26 min  Short Term Goals: Week 1: SLP Short Term Goal 1 (Week 1): Pt will return demonstration of how to donn/doff speaking valve over 3 targeted sessions with supervision.  SLP Short Term Goal 2 (Week 1): Pt will recall steps of how to clean speaking valve over 3 targeted sessions with supervision.  SLP Short Term Goal 3 (Week 1): Pt will utliize compensatory strategies to facilitate recall of daily information with min assist verbal cues.   SLP Short Term Goal 4 (Week 1): Pt will complete basic to semi-complex self care and/or home management tasks with min assist verbal cues for functional problem solving  SLP Short Term Goal 5 (Week 1): Pt will recognize and correct errors in the moment during functional tasks with min assist verbal cues.   Skilled Therapeutic Interventions:  Pt was seen for skilled ST targeting ongoing pt education.  Upon arrival, pt was sitting upright in bed, awake, alert, and agreeable to participate in ST.  Pt had no recall of education from yesterday's therapy session; therefore, SLP reviewed and reinforced recommendations for care and cleaning of speaking valve.   Following a ~10 minute delay pt was able to recall recommendations with min assist question cues.  Pt also required max to total assist multimodal cues to donn and doff speaking valve appropriately with a mirror, despite being able to donn/doff valve with min assist/supervision yesterday.  Pt would frequently become distracted while manipulating trach collar and required cues to locate speaking valve.  Will continue to follow up to address cognition which is likely impacting carryover of education.  Continue per current plan of care.     Function:  Eating Eating                  Cognition Comprehension Comprehension assist level: Understands basic 75 - 89% of the time/ requires cueing 10 - 24% of the time  Expression Expression assistive device: Talk trach valve Expression assist level: Expresses basic 90% of the time/requires cueing < 10% of the time.  Social Interaction Social Interaction assist level: Interacts appropriately 75 - 89% of the time - Needs redirection for appropriate language or to initiate interaction.  Problem Solving Problem solving assist level: Solves basic 50 - 74% of the time/requires cueing 25 - 49% of the time  Memory Memory assist level: Recognizes or recalls 50 - 74% of the time/requires cueing 25 - 49% of the time    Pain Pain Assessment Pain Assessment: No/denies pain   Therapy/Group: Individual Therapy  Jonathan Le, Jonathan Le 09/01/2015, 11:43 AM

## 2015-09-01 NOTE — Progress Notes (Signed)
Physical Therapy Session Note  Patient Details  Name: Jonathan HarpsHarold D Strohecker MRN: [id number] Date of Birth: 09/13/1946  Today's Date: 09/01/2015 PT Individual Time: 1340-1440 PT Individual Time Calculation (min): 60 min   Short Term Goals: Week 1:  PT Short Term Goal 1 (Week 1): Will perform supine <>sit with maxA +1 and bedrails PT Short Term Goal 2 (Week 1): Will perform sitting balance on EOB x10 min with no UE support while performing functional task PT Short Term Goal 3 (Week 1): Will perform sit <>stand with maxA +2 PT Short Term Goal 4 (Week 1): Will perform w/c propulsion x100' with modA  Skilled Therapeutic Interventions/Progress Updates:  +2 skilled tx for placing Maxislide fabrics under pt in supine in bed, and transferring on/off bed to bariatric tilt table.  Pt assisted with moving laterally on bed and tilt table with minimal L><R movements of upper body or lower body and bil LEs.  Pt quite anxious about tilt table; emotional support provided throughout session. O2 via trach collar at 5L/min' sats stayed above 90% throughout session.  Pt tolerated tilt table at approx 20 degrees for majority of session, and at 30 degrees x 1 minute, and at 35 degrees x 30 seconds then 30 degrees x 30 seconds.  Pt's tolerance of upright limited by R knee pain.  Therapist repositioned pt's bil LEs in more abducted position, with focus on limited bil hip external rotation.  He got some R knee pain relief with RLE repositioning, but pain persisted in R knee after pt was lowered.  Pt reported that he got cortisone shots in R knee as needed, most recently about 4 months ago, and had R knee fluid aspirated "as needed" at his discretion, per pt.   Pt returned to bed and positioned in partial R sidelying to relieve pressure on buttocks.  Left with all needs within reach and sister and brother in law present in room.     Therapy Documentation Precautions:  Precautions Precautions: Fall Precaution Comments:  continuous pulse ox, trach, O2, sacral decub with increased pain Restrictions Weight Bearing Restrictions: No Other Position/Activity Restrictions: trach collar       See Function Navigator for Current Functional Status.   Therapy/Group: Individual Therapy  Cullen Lahaie 09/01/2015, 4:16 PM

## 2015-09-01 NOTE — Progress Notes (Signed)
Occupational Therapy Session Note  Patient Details  Name: Jonathan Le MRN: [id number] Date of Birth: 02/13/1946  Today's Date: 09/01/2015 OT Individual Time: 1501-1536 OT Individual Time Calculation (min): 35 min    Skilled Therapeutic Interventions/Progress Updates:    Pt worked on rolling side to side in the bed for doffing and donning new pants secondary to the ones that we put on this morning were wet.  Mod assist for rolling side to side with mod instructional cueing to sequence.  Pt reporting increased pain in the right knee with AAROM for bending it to initiate rolling.  He needed total assist to transition to sitting in order to work on trunk control as well as doffing and donning a new shirt.  Min assist needed for initial sitting balance secondary to LOB posteriorly.  Pt was able to doff the dirty shirt with increased time and mod instructional cueing for sequencing.  He needed mod instructional cueing as well for donning new one with mod assist for pulling over his head and down over his back.  Pt returned back to supine after completion of task.  Pt positioned on his right side as to relieve pressure on his sacral wound..   Therapy Documentation Precautions:  Precautions Precautions: Fall Precaution Comments: continuous pulse ox, trach, O2, sacral decub with increased pain Restrictions Weight Bearing Restrictions: No Other Position/Activity Restrictions: trach collar  Vital Signs: Therapy Vitals Temp: 98.4 F (36.9 C) Temp Source: Oral Pulse Rate: (!) 104 Resp: 20 BP: 110/70 mmHg Patient Position (if appropriate): Lying Oxygen Therapy SpO2: 98 % O2 Device: Tracheostomy Collar O2 Flow Rate (L/min): 5 L/min FiO2 (%): 28 % Pain: Pain Assessment Pain Assessment: 0-10 Pain Score: 0-No pain Faces Pain Scale: Hurts little more Pain Type: Acute pain Pain Location: Back Pain Orientation: Mid Pain Descriptors / Indicators: Grimacing Pain Intervention(s):  Repositioned;Emotional support Multiple Pain Sites: Yes 2nd Pain Site Wong-Baker Pain Rating: Hurts little more Pain Type: Acute pain Pain Location: Knee Pain Orientation: Right Pain Frequency: Intermittent Pain Intervention(s): Repositioned;Emotional support ADL: See Function Navigator for Current Functional Status.   Therapy/Group: Individual Therapy  Shell Yandow OTR/L 09/01/2015, 5:35 PM

## 2015-09-01 NOTE — Progress Notes (Signed)
Occupational Therapy Session Note  Patient Details  Name: Jonathan HarpsHarold D Yerby MRN: [id number] Date of Birth: 08/20/1946  Today's Date: 09/01/2015 OT Individual Time: 0800-0900 OT Individual Time Calculation (min): 60 min    Short Term Goals: Week 1:  OT Short Term Goal 1 (Week 1): Pt will complete bathing with mod assist seated at EOB with assist of 1 caregiver OT Short Term Goal 2 (Week 1): Pt will complete UB dressing with mod assist and min cues for problem solving/organization OT Short Term Goal 3 (Week 1): Pt will complete LB dressing at bed level with max assist OT Short Term Goal 4 (Week 1): Pt will complete sit > stand from EOB with +2 assist to complete 1 grooming task  Skilled Therapeutic Interventions/Progress Updates:    Pt performed bathing and dressing sit to supine.  Total assist to transition to sitting from supine with supervision to maintain balance with UB selfcare and min guard for bending forward when washing his LEs.  Issued LH sponge for washing his feet as well.  Pt able to tolerate sitting EOB this session for 30 mins without significant complaint with pain except with initial sitting.  Returned to supine for LB bathing and for donning pants with rolling side to side.  Pt still needs assistance for bending up LEs to initiate rolling with mod assist to complete the roll.  Decreased BUE shoulder strength for pulling shirt over head and then down.  Oxygen sats 95% on trach collar at 5Ls and 28% oxygen.  Pt left in supine with HOB elevated for breakfast.    Therapy Documentation Precautions:  Precautions Precautions: Fall Precaution Comments: continuous pulse ox, trach, O2, sacral decub with increased pain Restrictions Weight Bearing Restrictions: No Other Position/Activity Restrictions: trach collar  Vital Signs: Therapy Vitals Pulse Rate: (!) 102 Resp: 18 Patient Position (if appropriate): Lying Oxygen Therapy SpO2: 99 % O2 Device: Tracheostomy Collar O2 Flow  Rate (L/min): 5 L/min FiO2 (%): 28 % Pain: Pain Assessment Pain Assessment: Faces Faces Pain Scale: Hurts little more Pain Type: Acute pain Pain Location: Back Pain Descriptors / Indicators: Aching Pain Intervention(s): Repositioned;Emotional support ADL: See Function Navigator for Current Functional Status.   Therapy/Group: Individual Therapy  Bobetta Korf OTR/L 09/01/2015, 11:30 AM

## 2015-09-01 NOTE — Progress Notes (Addendum)
ANTICOAGULATION CONSULT NOTE - Follow Up Consult  Pharmacy Consult for Coumadin Indication: DVT  Allergies  Allergen Reactions  . Levaquin [Levofloxacin] Nausea Only  . Penicillins Hives  . Doxycycline Rash    Patient Measurements: Height: 5\' 9"  (175.3 cm) Weight: 253 lb 9.6 oz (115.032 kg) IBW/kg (Calculated) : 70.7  Vital Signs: Temp: 98.2 F (36.8 C) (10/18 0646) Temp Source: Oral (10/18 0646) BP: 115/76 mmHg (10/18 0646) Pulse Rate: 100 (10/18 1149)  Labs:  Recent Labs  08/30/15 0440 08/31/15 0445 09/01/15 0425  LABPROT 25.1* 22.0* 20.3*  INR 2.30* 1.93* 1.73*  CREATININE  --  0.33*  --     Estimated Creatinine Clearance: 107.4 mL/min (by C-G formula based on Cr of 0.33).   Assessment: 69 YOM continued on coumadin for new LLE DVT (08/05/2015). INR trending down 1.73 despite dose increase yesterday. No new CBC since 10/14, No bleeding noted per chart. Flagyl has been stopped by ID doctor on 10/14, anticipate higher coumadin dose to keep his INR therapeutic.   Goal of Therapy:  INR 2-3 Monitor platelets by anticoagulation protocol: Yes   Plan:  Warf 6 mg po daily Daily INR  Bayard HuggerMei Laquashia Mergenthaler, PharmD, BCPS  Clinical Pharmacist  Pager: [phone]   09/01/2015,1:19 PM  Addendum:  Pt has been on meropenem for intra-abdominal infection with abscess s/p drains, Rt drain removed 10/18, L drain still in. ID recommended to switch meropenem to unasyn. He will need protracted course of abx until all of his abscesses are completely resolved (perhaps another month). Might switch to augmentin at some point if he does well on unasyn. WBC WNL, afebrile.sacral wound, s/p I&D 10/14. Scr 0.33, est. crcl > 90 ml/min.   Noted pt with penicillin allergy (hives), per family, pt has never been on amoxicillin or augmentin before. Discussed with RN to keep a close eye on him during the first dose, he already has prn benadryl on profile.     Plan: Unasyn 3g IV Q 6 hrs F/u tolerance F/u  clinical improvement and LOT  Bayard HuggerMei Auther Lyerly, PharmD, BCPS  Clinical Pharmacist  Pager: [phone]

## 2015-09-01 NOTE — Progress Notes (Signed)
Referring Physician(s): Dr Wynn BankerKirsteins  Chief Complaint:  Post colectomy abscess Drains placed (4)   Subjective:  All drains intact Output diminishing CT 10/17: IMPRESSION: Decreased sizes of abscess collections seen on previous exam post percutaneous drainage.  Rt has no output for days L drains although minimal output- still 15- 30 cc/day discussed with Dr Fredia SorrowYamagata Remove Rt today Follow L drains  Allergies: Levaquin; Penicillins; and Doxycycline  Medications: Prior to Admission medications   Medication Sig Start Date End Date Taking? Authorizing Provider  Amino Acids-Protein Hydrolys (FEEDING SUPPLEMENT, PRO-STAT SUGAR FREE 64,) LIQD Place 30 mLs into feeding tube 5 (five) times daily. 07/25/15  Yes [name], NP  white petrolatum (VASELINE) GEL Apply 1 application topically daily. Apply to L nare wound daily 07/08/15  Yes [name], NP     Vital Signs: BP 115/76 mmHg  Pulse 102  Temp(Src) 98.2 F (36.8 C) (Oral)  Resp 18  Ht 5\' 9"  (1.753 m)  Wt 253 lb 9.6 oz (115.032 kg)  BMI 37.43 kg/m2  SpO2 99%  Physical Exam  Abdominal: Soft. Bowel sounds are normal.  All drains intact Sites clean and dry Rt output none for days L -- all drains with 15-30 cc/day Serous output  Rt drain removed at bedside Without difficulty  Nursing note and vitals reviewed.   Imaging: Ct Abdomen Pelvis Wo Contrast  08/31/2015  CLINICAL DATA:  Intra-abdominal abscess, sacral decubitus, hypertension, CHF, prior CABG, morbid obesity EXAM: CT ABDOMEN AND PELVIS WITHOUT CONTRAST TECHNIQUE: Multidetector CT imaging of the abdomen and pelvis was performed following the standard protocol without IV contrast. Sagittal and coronal MPR images reconstructed from axial data set. Patient refused IV contrast. Small amount of oral contrast is present on the exam. COMPARISON:  08/21/2015 FINDINGS: LEFT pleural effusion. BILATERAL lower lobe atelectasis, cannot exclude infiltrate in LEFT  lower lobe. Liver, spleen, pancreas, and adrenal glands unremarkable for technique. BILATERAL nonobstructing renal calculi. Pigtail drainage catheters lateral RIGHT mid abdomen and lateral LEFT mid abdomen and lateral upper LEFT pelvis. Decrease in sizes of abscess collections seen previously. RIGHT lateral abdominal abscess measures 5.7 x 2.0 cm image 45 previously 8.0 x 2.5 cm. Minimal residual fluid surrounding the cranial most drainage catheter, 4.0 x 1.5 cm image 38. Minimal residual fluid at 2 remaining abscesses in the lateral LEFT abdomen/ pelvis. Persistent thickening the lobe soft tissues out the lateral LEFT abdominal wall just above the iliac crest. Significant fluid within rectal stump. Catheter decompresses urinary bladder with note of significant prostatic enlargement. Visualized bowel loops appear normal caliber without dilatation or evidence of obstruction. RIGHT lower quadrant ostomy again noted. Scattered atherosclerotic calcifications. No new abscess collections, free air or free fluid identified. LEFT inguinal hernia containing fat. Presacral abscess with gas extending to near bone without definite bone destruction. 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 No additional focal osseous abnormalities. IMPRESSION: Decreased sizes of abscess collections seen on previous exam post percutaneous drainage. Small LEFT pleural effusion with bibasilar atelectasis, little changed. Prostatic enlargement. LEFT inguinal hernia. BILATERAL nonobstructing renal calculi. No definite new intra-abdominal or intrapelvic abnormalities. Electronically Signed   By: [name] M.D.   On: 08/31/2015 19:14    Labs:  CBC:  Recent Labs  08/07/15 0700 08/16/15 0701 08/20/15 0648 08/28/15 0430  WBC 8.7 9.2 5.2 6.6  HGB 7.4* 7.5* 8.8* 8.2*  HCT 24.0* 25.6* 29.3* 26.9*  PLT 418* 387 328 349    COAGS:  Recent Labs  07/21/15 0815  07/23/15 1200 07/25/15 0419  07/30/15 0627  08/29/15 0502 08/30/15 0440 08/31/15 0445 09/01/15 0425  INR 1.65*  < > 2.62* 1.48 1.35  < > 2.32* 2.30* 1.93* 1.73*  APTT 29  --  38* 28 31  --   --   --   --   --   < > = values in this interval not displayed.  BMP:  Recent Labs  08/27/15 0920 08/28/15 0430 08/29/15 0502 08/31/15 0445  NA 141 139 135 135  K 3.2* 3.3* 3.6 4.0  CL 103 99* 97* 98*  CO2 32 33* 31 32  GLUCOSE 128* 100* 99 94  BUN CALCIUM 8.1* 8.1* 8.0* 8.1*  CREATININE 0.33* 0.32* 0.35* 0.33*  GFRNONAA >60 >60 >60 >60  GFRAA >60 >60 >60 >60    LIVER FUNCTION TESTS:  Recent Labs  07/23/15 0600 07/24/15 0345 07/25/15 0419  07/28/15 0630 08/02/15 0620 08/16/15 0701 08/28/15 0430  BILITOT 1.0 0.7 0.9  --   --   --   --  0.6  AST 30 40 35  --   --   --   --  22  ALT 15* 15* 14*  --   --   --   --  10*  ALKPHOS 154* 167* 199*  --   --   --   --  80  PROT 4.6* 4.3* 4.5*  --   --   --   --  5.2*  ALBUMIN <1.0* <1.0* <1.0*  < > 1.4* 1.2* 1.2* 1.6*  < > = values in this interval not displayed.  Assessment and Plan:  abd abscesses CT findings show resolved Rt abscess Much smaller L abscesses Rt drain removal today per Dr Brantley Persons removed and new dressing placed  Signed: Marsia Cino A 09/01/2015, 9:48 AM   I spent a total of 15 Minutes at the the patient's bedside AND on the patient's hospital floor or unit, greater than 50% of which was counseling/coordinating care for abscess drain removal

## 2015-09-01 NOTE — Progress Notes (Signed)
Pt is stable at this time eating dinner and talking on the phone. RT will continue to monitor throughout the night. No distress noted.

## 2015-09-01 NOTE — Progress Notes (Signed)
[facility]    Subjective: No new complaints   Antibiotics:  Anti-infectives    Start     Dose/Rate Route Frequency Ordered Stop   08/27/15 2200  meropenem (MERREM) 1 g in sodium chloride 0.9 % 100 mL IVPB     1 g 200 mL/hr over 30 Minutes Intravenous 3 times per day 08/27/15 1658 09/03/15 2359   08/27/15 1615  metroNIDAZOLE (FLAGYL) tablet 500 mg  Status:  Discontinued     500 mg Oral 3 times per day 08/27/15 1610 08/28/15 1651      Medications: Scheduled Meds: . antiseptic oral rinse  15 mL Mouth Rinse TID PC  . collagenase   Topical BID  . famotidine  20 mg Oral BID  . feeding supplement (ENSURE ENLIVE)  237 mL Oral BID BM  . feeding supplement (PRO-STAT SUGAR FREE 64)  30 mL Oral TID  . furosemide  60 mg Oral Daily  . hydrocerin   Topical BID  . insulin aspart  0-5 Units Subcutaneous QHS  . insulin aspart  0-9 Units Subcutaneous TID WC  . magnesium oxide  800 mg Oral BID  . meropenem (MERREM) IV  1 g Intravenous 3 times per day  . multivitamin with minerals  1 tablet Oral Daily  . potassium chloride  20 mEq Oral TID  . vitamin C  500 mg Oral BID  . warfarin  6 mg Oral ONCE-1800  . Warfarin - Pharmacist Dosing Inpatient   Does not apply q1800   Continuous Infusions:  PRN Meds:.ALPRAZolam, alum & mag hydroxide-simeth, bisacodyl, diphenhydrAMINE, guaiFENesin-dextromethorphan, ipratropium-albuterol, ondansetron **OR** ondansetron (ZOFRAN) IV, oxyCODONE, sodium chloride, sodium phosphate, traZODone    Objective: Weight change:   Intake/Output Summary (Last 24 hours) at 09/01/15 1432 Last data filed at 09/01/15 1201  Gross per 24 hour  Intake    290 ml  Output   3325 ml  Net  -3035 ml   Blood pressure 115/76, pulse 100, temperature 98.2 F (36.8 C), temperature source Oral, resp. rate 18, height  (1.753 m), weight 253 lb 9.6 oz (115.032 kg), SpO2 95 %. Temp:  [97.7 F (36.5 C)-98.2 F (36.8 C)] 98.2 F (36.8  C) (10/18 0646) Pulse Rate:  [95-103] 100 (10/18 1149) Resp:  [17-18] 18 (10/18 1149) BP: (102-115)/(65-76) 115/76 mmHg (10/18 0646) SpO2:  [94 %-100 %] 95 % (10/18 1149) FiO2 (%):  [21 %-28 %] 28 % (10/18 1149)  Physical Exam: General: Alert and awake, oriented x3, not in any acute distress. HEENT: anicteric sclera, EOMI CVS regular rate, normal r,  no murmur rubs or gallops Chest: clear to auscultation bilaterally, no wheezing, rales or rhonchi Abdomen: soft nontender, nondistended, normal bowel sounds, several JP drains in place Extremities: no  clubbing or edema noted bilaterally Skin: venous stasis changes in bilateral lower extremities decubitus ulcers not examined today  Neuro: global weakness  CBC: CBC Latest Ref Rng 08/28/2015 08/20/2015 08/16/2015  WBC 4.0 - 10.5 K/uL 6.6 5.2 9.2  Hemoglobin 13.0 - 17.0 g/dL 8.2(L) 8.8(L) 7.5(L)  Hematocrit 39.0 - 52.0 % 26.9(L) 29.3(L) 25.6(L)  Platelets 150 - 400 K/uL 349 328 387       BMET  Recent Labs  08/31/15 0445  NA 135  K 4.0  CL 98*  CO2 32  GLUCOSE 94  BUN 10  CREATININE 0.33*  CALCIUM 8.1*     Liver Panel  No results for input(s): PROT, ALBUMIN,  AST, ALT, ALKPHOS, BILITOT, BILIDIR, IBILI in the last 72 hours.     Sedimentation Rate No results for input(s): ESRSEDRATE in the last 72 hours. C-Reactive Protein No results for input(s): CRP in the last 72 hours.  Micro Results: Recent Results (from the past 720 hour(s))  Culture, blood (routine x 2)     Status: None   Collection Time: 08/05/15  1:47 PM  Result Value Ref Range Status   Specimen Description BLOOD PICC LINE  Final   Special Requests BOTTLES DRAWN AEROBIC AND ANAEROBIC 10CC  Final   Culture NO GROWTH 5 DAYS  Final   Report Status 08/10/2015 FINAL  Final  Culture, blood (routine x 2)     Status: None   Collection Time: 08/05/15  1:55 PM  Result Value Ref Range Status   Specimen Description BLOOD LEFT HAND  Final   Special Requests  BOTTLES DRAWN AEROBIC ONLY 10CC  Final   Culture NO GROWTH 5 DAYS  Final   Report Status 08/10/2015 FINAL  Final  C difficile quick scan w PCR reflex     Status: None   Collection Time: 08/05/15  2:04 PM  Result Value Ref Range Status   C Diff antigen NEGATIVE NEGATIVE Final   C Diff toxin NEGATIVE NEGATIVE Final   C Diff interpretation Negative for toxigenic C. difficile  Final  Stat Gram stain     Status: None   Collection Time: 08/05/15  4:30 PM  Result Value Ref Range Status   Specimen Description SPUTUM  Final   Special Requests NONE  Final   Gram Stain   Final    RARE WBC PRESENT, PREDOMINANTLY MONONUCLEAR RARE GRAM POSITIVE COCCI IN PAIRS Gram Stain Report Called to,Read Back By and Verified WithArminda Resides: C THOMPSON RN 1815 08/05/15 A BROWNING    Report Status 08/05/2015 FINAL  Final  Culture, respiratory (NON-Expectorated)     Status: None   Collection Time: 08/05/15  4:30 PM  Result Value Ref Range Status   Specimen Description TRACHEAL ASPIRATE  Final   Special Requests NONE  Final   Gram Stain   Final    MODERATE WBC PRESENT,BOTH PMN AND MONONUCLEAR RARE SQUAMOUS EPITHELIAL CELLS PRESENT RARE GRAM POSITIVE COCCI IN PAIRS [facility]formed at [facility]    Culture   Final    Non-Pathogenic Oropharyngeal-type Flora Isolated. Performed at [facility]    Report Status 08/08/2015 FINAL  Final  Culture, Urine     Status: None   Collection Time: 08/05/15  6:21 PM  Result Value Ref Range Status   Specimen Description URINE, RANDOM  Final   Special Requests NONE  Final   Culture NO GROWTH 2 DAYS  Final   Report Status 08/07/2015 FINAL  Final  Stat Gram stain     Status: None   Collection Time: 08/05/15  6:21 PM  Result Value Ref Range Status   Specimen Description URINE, RANDOM  Final   Special Requests NONE  Final   Gram Stain   Final    WBC PRESENT, PREDOMINANTLY MONONUCLEAR NO ORGANISMS SEEN CYTOSPIN    Report Status 08/05/2015 FINAL  Final     Studies/Results: Ct Abdomen Pelvis Wo Contrast  08/31/2015  CLINICAL DATA:  Intra-abdominal abscess, sacral decubitus, hypertension, CHF, prior CABG, morbid obesity EXAM: CT ABDOMEN AND PELVIS WITHOUT CONTRAST TECHNIQUE: Multidetector CT imaging of the abdomen and pelvis was performed following the standard protocol without IV contrast. Sagittal and coronal MPR images reconstructed from axial data set. Patient  refused IV contrast. Small amount of oral contrast is present on the exam. COMPARISON:  08/21/2015 FINDINGS: LEFT pleural effusion. BILATERAL lower lobe atelectasis, cannot exclude infiltrate in LEFT lower lobe. Liver, spleen, pancreas, and adrenal glands unremarkable for technique. BILATERAL nonobstructing renal calculi. Pigtail drainage catheters lateral RIGHT mid abdomen and lateral LEFT mid abdomen and lateral upper LEFT pelvis. Decrease in sizes of abscess collections seen previously. RIGHT lateral abdominal abscess measures 5.7 x 2.0 cm image 45 previously 8.0 x 2.5 cm. Minimal residual fluid surrounding the cranial most drainage catheter, 4.0 x 1.5 cm image 38. Minimal residual fluid at 2 remaining abscesses in the lateral LEFT abdomen/ pelvis. Persistent thickening the lobe soft tissues out the lateral LEFT abdominal wall just above the iliac crest. Significant fluid within rectal stump. Catheter decompresses urinary bladder with note of significant prostatic enlargement. Visualized bowel loops appear normal caliber without dilatation or evidence of obstruction. RIGHT lower quadrant ostomy again noted. Scattered atherosclerotic calcifications. No new abscess collections, free air or free fluid identified. LEFT inguinal hernia containing fat. Presacral abscess with gas extending to near bone without definite bone destruction. 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 No additional focal osseous abnormalities. IMPRESSION: Decreased sizes of abscess  collections seen on previous exam post percutaneous drainage. Small LEFT pleural effusion with bibasilar atelectasis, little changed. Prostatic enlargement. LEFT inguinal hernia. BILATERAL nonobstructing renal calculi. No definite new intra-abdominal or intrapelvic abnormalities. Electronically Signed   By: Ulyses Southward M.D.   On: 08/31/2015 19:14      Assessment/Plan:  INTERVAL HISTORY:  08/31/15: pt getting ready for repeat CT scan, CCS satisfied with progress with sacral wound 09/01/15: scan showed improvement and one drain (right) removed  Principal Problem:   Critical illness myopathy Active Problems:   Debility   Intra-abdominal abscess (HCC)   Critical illness neuropathy (HCC)   Enterococcal infection   Sacral decubitus ulcer   Hypoalbuminemia due to protein-calorie malnutrition (HCC)   Abscess of abdominal cavity (HCC)    Jonathan Le is a 69 y.o. male with  complicated recent hospital course including intra-abdominal abscesses status post drainage of a care myopathy decubitus ulcers  #1 intra-abdominal abscesses: CT looks improved. One drain DC'd  Would change to IV unasyn while in house which can ultimately be changed to oral augmentin as he nears DC from rehab or sooner if he tolerates IV unasyn and giong to PO will liberate him in rehab from IV   He will need protracted course of abx until all of his abscesses are completely resolved (perhaps another month)  #2 decubitus ulcer : Still need to examine this area, will do so tomorrow.    LOS: 5 days   Acey Lav 09/01/2015, 2:32 PM

## 2015-09-02 ENCOUNTER — Inpatient Hospital Stay (HOSPITAL_COMMUNITY): Payer: Medicare Other | Admitting: Occupational Therapy

## 2015-09-02 ENCOUNTER — Inpatient Hospital Stay (HOSPITAL_COMMUNITY): Payer: Medicare Other | Admitting: Speech Pathology

## 2015-09-02 ENCOUNTER — Inpatient Hospital Stay (HOSPITAL_COMMUNITY): Payer: Medicare Other | Admitting: Physical Therapy

## 2015-09-02 LAB — PROTIME-INR
INR: 1.79 — ABNORMAL HIGH (ref 0.00–1.49)
PROTHROMBIN TIME: 20.7 s — AB (ref 11.6–15.2)

## 2015-09-02 LAB — GLUCOSE, CAPILLARY
GLUCOSE-CAPILLARY: 94 mg/dL (ref 65–99)
GLUCOSE-CAPILLARY: 108 mg/dL — AB (ref 65–99)
GLUCOSE-CAPILLARY: 202 mg/dL — AB (ref 65–99)
GLUCOSE-CAPILLARY: 99 mg/dL (ref 65–99)
Glucose-Capillary: 118 mg/dL — ABNORMAL HIGH (ref 65–99)

## 2015-09-02 MED ORDER — WARFARIN SODIUM 7.5 MG PO TABS
7.5000 mg | ORAL_TABLET | Freq: Every day | ORAL | Status: DC
  Administered 2015-09-02: 7.5 mg via ORAL
  Filled 2015-09-02: qty 1

## 2015-09-02 MED ORDER — DICLOFENAC SODIUM 1 % TD GEL
2.0000 g | Freq: Four times a day (QID) | TRANSDERMAL | Status: DC
  Administered 2015-09-02 – 2015-09-25 (×78): 2 g via TOPICAL
  Filled 2015-09-02 (×3): qty 100

## 2015-09-02 NOTE — Patient Care Conference (Signed)
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 Abscess of abdominal cavity (HCC)   . Enterococcal infection   . Sacral decubitus ulcer   . Hypoalbuminemia due to protein-calorie malnutrition (HCC)   . 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 Intra-abdominal abscess (HCC)   . Debility 08/27/2015  . Acute on chronic respiratory failure, unspecified whether with hypoxia or hypercapnia (HCC)   . Encounter for orogastric (OG) tube placement   . Abdominal abscess (HCC)   . Abscess   . Encounter for central line placement   . Encounter for intubation   . Respiratory failure (HCC)   . Septic shock (HCC)   . Blood poisoning (HCC)   . Tracheostomy status (HCC)   . Acute respiratory failure (HCC)   . Pressure ulcer 06/21/2015  . Shock (HCC) 06/18/2015  . Abdominal pain, generalized   . Shortness of breath   . Hypoxia   . 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 Cardiomyopathy, ischemic 05/21/2015  . CAD, multiple vessel 05/21/2015  . Acute systolic congestive heart failure, NYHA class 3 (HCC) 05/21/2015  . Morbid obesity (HCC) 05/20/2015  . OSA on CPAP 05/20/2015  . Other specified hypotension   . NSTEMI (non-ST elevated myocardial infarction) (HCC) 05/19/2015  . Benign prostatic hyperplasia with urinary obstruction 04/24/2015  . Chronic LBP 12/05/2013  . Essential hypertension 09/04/2013     Expected Discharge Date: Expected Discharge Date: 09/24/15  Team Members Present: Physician leading conference: Dr. Claudette LawsAndrew Kirsteins Social Worker Present: Dossie DerBecky Shakeila Pfarr, LCSW Nurse Present: Chana Bodeeborah Sharp, RN PT Present: Other (comment) Christena Deem(Karen Donawerth-PT) OT Present: Perrin MalteseJames McGuire, OT SLP Present: Jackalyn LombardNicole Page, SLP PPS Coordinator present : Tora DuckMarie Noel, RN, CRRN     Current Status/Progress Goal Weekly Team Focus  Medical   Anxiety with movement. Severe lower extremity and moderately severe upper extremity weakness due to critical illness mmyopathy and neuropathy  One-person assist  Increase activity tolerance  including upright, tilted   Bowel/Bladder   Foley- unstageable wound to sacrum; ileostomy- good output  Total assist  Assess ostomy for potential problems   Swallow/Nutrition/ Hydration     na        ADL's   min assist for UB bathing, mod assist for UB dressing.  He needs mod assist for rolling in bed during LB selfcare with total assist for LB bathing and dressing.  Max to total assist for supine to sit EOB.  Have not attempted OOB activity secondary to pa having too much pain in his buttocks in sitting.    overall min to mod assist  selfcare re-training, transfer training, pt/family education,  balance re-training, increasing OOB tolerance   Mobility   +2 for bed mobilty, lateral slide transfer on MaxiSlide to tilt table; tolerance x 1 minute of 30 degrees upright on tilt table   supervision bed mobility; mod assist basic and  car transfers, mod assist gait x 16' with therapists only; supervision w/c propulsion x 150' controlled, 75' home env, and min assist  x 200' community setting  bed mobilty, pre gait, transfers, tolerance of upright position on tilt table, pt ed, tolerance of sitting upright in w/c   Communication   tolerating Passy Muir speaking valve during all waking hours but needs cues for recall of care and cleaning of valve, donning and doffing  mod I   continue  education    Safety/Cognition/ Behavioral Observations  fluctuates, varies between supervision to max assist depending on medication effects and lethargy   supervision   continue to address basic to semi-complex problem solving    Pain   Denies pain         Skin   Multiple wounds; unstageable to heel, sacrum-hydrotherapy and santyl in use; midline incision to abdomen with hydrogel  No further skin breakdown or infection with max assist.  Assess skin q shift and prn; perform dressing changes per MD order      *See Care Plan and progress notes for long and short-term goals.  Barriers to Discharge: Patient fears  movement complaints of pain with movement in multiple areas    Possible Resolutions to Barriers:  Voltaren gel for knee pain    Discharge Planning/Teaching Needs:  Discharge plan is dependent upon pt's progress here, wife can not do much physical care for pt. Sister is supportive but can not assist. Probable plan is NHP      Team Discussion:  Goals-min/mod level, extremely deconditioned-difficulty sitting up. Daily hydrotherapy for sacral wound. Pulmonary following needs to be stronger before discharging trach. Knee pain limits him. Activity tolerance poor working up this. Would benefit from neuro-psych seeing. Tilt-table tried pt didn't like.  Revisions to Treatment Plan:  None   Continued Need for Acute Rehabilitation Level of Care: The patient requires daily medical management by a physician with specialized training in physical medicine and rehabilitation for the following conditions: Daily direction of a multidisciplinary physical rehabilitation program to ensure safe treatment while eliciting the highest outcome that is of practical value to the patient.: Yes Daily medical management of patient stability for increased activity during participation in an intensive rehabilitation regime.: Yes Daily analysis of laboratory values and/or radiology reports with any subsequent need  for medication adjustment of medical intervention for : Neurological problems;Other;Post surgical problems  Jonathan Le 09/02/2015, 1:11 PM

## 2015-09-02 NOTE — Progress Notes (Signed)
Physical Therapy Note  Patient Details  Name: Ardyth HarpsHarold D Scarfo MRN: [id number] Date of Birth: 01/28/1946 Today's Date: 09/02/2015    Time: 800-855 55 minutes  1:1 Pt c/o pain in back and buttocks with sitting, able to participate in session, pt does not want pain meds.  Rolling in bed with min A and use of rails, assist for B LE placement.  Sidelying to sit with max A.  Seated EOB x 25 minutes with encouragement to stay sitting. Pt performed sitting balance with LE therex with supervision for 2 x 10 LAQ, AP, marches.  Partial sit to stands with +2 assist, pt able to bear wt in LEs with increased pain.  Unable to fully raise bottom off of bed.  Supine therex as pt could no longer tolerate sitting.  Heel slides, hip abd/add, add squeeze, SAQ, AP, UE flexion.  Pt limited by pain and fatigue throughout session.  O2 sats 80-96 with sitting.   DONAWERTH,KAREN 09/02/2015, 8:51 AM

## 2015-09-02 NOTE — Progress Notes (Signed)
ANTICOAGULATION CONSULT NOTE - Follow Up Consult  Pharmacy Consult for Coumadin Indication: DVT  Allergies  Allergen Reactions  . Levaquin [Levofloxacin] Nausea Only  . Penicillins Hives  . Doxycycline Rash    Patient Measurements: Height: 5\' 9"  (175.3 cm) Weight: 249 lb 1.9 oz (113 kg) IBW/kg (Calculated) : 70.7  Vital Signs: Temp: 98.3 F (36.8 C) (10/19 0542) Temp Source: Oral (10/19 0542) BP: 106/85 mmHg (10/19 0542) Pulse Rate: 100 (10/19 1217)  Labs:  Recent Labs  08/31/15 0445 09/01/15 0425 09/02/15 0231  LABPROT 22.0* 20.3* 20.7*  INR 1.93* 1.73* 1.79*  CREATININE 0.33*  --   --     Estimated Creatinine Clearance: 106.5 mL/min (by C-G formula based on Cr of 0.33).   Assessment: 69 YOM continued on coumadin for new LLE DVT (08/05/2015). INR 1.79 despite dose increase in the past 2 days. No new CBC since 10/14, No bleeding noted per chart. Flagyl has been stopped by ID doctor on 10/14, anticipate higher coumadin dose to keep his INR therapeutic.   Goal of Therapy:  INR 2-3 Monitor platelets by anticoagulation protocol: Yes   Plan:  Warf 7.5 mg po daily Daily INR  Bayard HuggerMei Argyle Gustafson, PharmD, BCPS  Clinical Pharmacist  Pager: [phone]   09/02/2015,1:10 PM

## 2015-09-02 NOTE — Progress Notes (Signed)
[facility]    Subjective: No new complaints   Antibiotics:  Anti-infectives    Start     Dose/Rate Route Frequency Ordered Stop   09/01/15 1800  Ampicillin-Sulbactam (UNASYN) 3 g in sodium chloride 0.9 % 100 mL IVPB     3 g 100 mL/hr over 60 Minutes Intravenous Every 6 hours 09/01/15 1539     08/27/15 2200  meropenem (MERREM) 1 g in sodium chloride 0.9 % 100 mL IVPB  Status:  Discontinued     1 g 200 mL/hr over 30 Minutes Intravenous 3 times per day 08/27/15 1658 09/01/15 1436   08/27/15 1615  metroNIDAZOLE (FLAGYL) tablet 500 mg  Status:  Discontinued     500 mg Oral 3 times per day 08/27/15 1610 08/28/15 1651      Medications: Scheduled Meds: . ampicillin-sulbactam (UNASYN) IV  3 g Intravenous Q6H  . antiseptic oral rinse  15 mL Mouth Rinse TID PC  . collagenase   Topical BID  . diclofenac sodium  2 g Topical QID  . famotidine  20 mg Oral BID  . feeding supplement (ENSURE ENLIVE)  237 mL Oral BID BM  . feeding supplement (PRO-STAT SUGAR FREE 64)  30 mL Oral TID  . furosemide  60 mg Oral Daily  . hydrocerin   Topical BID  . insulin aspart  0-5 Units Subcutaneous QHS  . insulin aspart  0-9 Units Subcutaneous TID WC  . magnesium oxide  800 mg Oral BID  . multivitamin with minerals  1 tablet Oral Daily  . potassium chloride  20 mEq Oral TID  . vitamin C  500 mg Oral BID  . warfarin  7.5 mg Oral q1800  . Warfarin - Pharmacist Dosing Inpatient   Does not apply q1800   Continuous Infusions:  PRN Meds:.ALPRAZolam, alum & mag hydroxide-simeth, bisacodyl, diphenhydrAMINE, guaiFENesin-dextromethorphan, ipratropium-albuterol, ondansetron **OR** ondansetron (ZOFRAN) IV, oxyCODONE, sodium chloride, sodium phosphate, traZODone    Objective: Weight change:   Intake/Output Summary (Last 24 hours) at 09/02/15 1450 Last data filed at 09/02/15 1331  Gross per 24 hour  Intake    620 ml  Output   5110 ml  Net  -4490 ml   Blood  pressure 106/85, pulse 100, temperature 98.3 F (36.8 C), temperature source Oral, resp. rate 19, height  (1.753 m), weight 249 lb 1.9 oz (113 kg), SpO2 97 %. Temp:  [98.3 F (36.8 C)-98.4 F (36.9 C)] 98.3 F (36.8 C) (10/19 0542) Pulse Rate:  [95-113] 100 (10/19 1217) Resp:  [17-20] 19 (10/19 1217) BP: (106-110)/(70-85) 106/85 mmHg (10/19 0542) SpO2:  [95 %-98 %] 97 % (10/19 1217) FiO2 (%):  [28 %] 28 % (10/19 1217) Weight:  [249 lb 1.9 oz (113 kg)] 249 lb 1.9 oz (113 kg) (10/19 0542)  Physical Exam: General: Alert and awake, oriented x3, not in any acute distress. HEENT: anicteric sclera, EOMI CVS regular rate, normal r,  no murmur rubs or gallops Chest: clear to auscultation bilaterally, no wheezing, rales or rhonchi Abdomen: soft nontender, nondistended, normal bowel sounds, several JP drains in place Extremities: no  clubbing or edema noted bilaterally Skin: venous stasis changes in bilateral lower extremities decubitus ulcers not examined today  Neuro: global weakness  CBC: CBC Latest Ref Rng 08/28/2015 08/20/2015 08/16/2015  WBC 4.0 - 10.5 K/uL 6.6 5.2 9.2  Hemoglobin 13.0 - 17.0 g/dL 8.2(L) 8.8(L) 7.5(L)  Hematocrit 39.0 - 52.0 % 26.9(L)  29.3(L) 25.6(L)  Platelets 150 - 400 K/uL 349 328 387       BMET  Recent Labs  08/31/15 0445  NA 135  K 4.0  CL 98*  CO2 32  GLUCOSE 94  BUN 10  CREATININE 0.33*  CALCIUM 8.1*     Liver Panel  No results for input(s): PROT, ALBUMIN, AST, ALT, ALKPHOS, BILITOT, BILIDIR, IBILI in the last 72 hours.     Sedimentation Rate No results for input(s): ESRSEDRATE in the last 72 hours. C-Reactive Protein No results for input(s): CRP in the last 72 hours.  Micro Results: Recent Results (from the past 720 hour(s))  Culture, blood (routine x 2)     Status: None   Collection Time: 08/05/15  1:47 PM  Result Value Ref Range Status   Specimen Description BLOOD PICC LINE  Final   Special Requests BOTTLES DRAWN AEROBIC  AND ANAEROBIC 10CC  Final   Culture NO GROWTH 5 DAYS  Final   Report Status 08/10/2015 FINAL  Final  Culture, blood (routine x 2)     Status: None   Collection Time: 08/05/15  1:55 PM  Result Value Ref Range Status   Specimen Description BLOOD LEFT HAND  Final   Special Requests BOTTLES DRAWN AEROBIC ONLY 10CC  Final   Culture NO GROWTH 5 DAYS  Final   Report Status 08/10/2015 FINAL  Final  C difficile quick scan w PCR reflex     Status: None   Collection Time: 08/05/15  2:04 PM  Result Value Ref Range Status   C Diff antigen NEGATIVE NEGATIVE Final   C Diff toxin NEGATIVE NEGATIVE Final   C Diff interpretation Negative for toxigenic C. difficile  Final  Stat Gram stain     Status: None   Collection Time: 08/05/15  4:30 PM  Result Value Ref Range Status   Specimen Description SPUTUM  Final   Special Requests NONE  Final   Gram Stain   Final    RARE WBC PRESENT, PREDOMINANTLY MONONUCLEAR RARE GRAM POSITIVE COCCI IN PAIRS Gram Stain Report Called to,Read Back By and Verified WithArminda Resides RN 1815 08/05/15 A BROWNING    Report Status 08/05/2015 FINAL  Final  Culture, respiratory (NON-Expectorated)     Status: None   Collection Time: 08/05/15  4:30 PM  Result Value Ref Range Status   Specimen Description TRACHEAL ASPIRATE  Final   Special Requests NONE  Final   Gram Stain   Final    MODERATE WBC PRESENT,BOTH PMN AND MONONUCLEAR RARE SQUAMOUS EPITHELIAL CELLS PRESENT RARE GRAM POSITIVE COCCI IN PAIRS Performed at [facility]    Culture   Final    Non-Pathogenic Oropharyngeal-type Flora Isolated. Performed at [facility]    Report Status 08/08/2015 FINAL  Final  Culture, Urine     Status: None   Collection Time: 08/05/15  6:21 PM  Result Value Ref Range Status   Specimen Description URINE, RANDOM  Final   Special Requests NONE  Final   Culture NO GROWTH 2 DAYS  Final   Report Status 08/07/2015 FINAL  Final  Stat Gram stain     Status: None    Collection Time: 08/05/15  6:21 PM  Result Value Ref Range Status   Specimen Description URINE, RANDOM  Final   Special Requests NONE  Final   Gram Stain   Final    WBC PRESENT, PREDOMINANTLY MONONUCLEAR NO ORGANISMS SEEN CYTOSPIN    Report Status 08/05/2015 FINAL  Final  Studies/Results: Ct Abdomen Pelvis Wo Contrast  08/31/2015  CLINICAL DATA:  Intra-abdominal abscess, sacral decubitus, hypertension, CHF, prior CABG, morbid obesity EXAM: CT ABDOMEN AND PELVIS WITHOUT CONTRAST TECHNIQUE: Multidetector CT imaging of the abdomen and pelvis was performed following the standard protocol without IV contrast. Sagittal and coronal MPR images reconstructed from axial data set. Patient refused IV contrast. Small amount of oral contrast is present on the exam. COMPARISON:  08/21/2015 FINDINGS: LEFT pleural effusion. BILATERAL lower lobe atelectasis, cannot exclude infiltrate in LEFT lower lobe. Liver, spleen, pancreas, and adrenal glands unremarkable for technique. BILATERAL nonobstructing renal calculi. Pigtail drainage catheters lateral RIGHT mid abdomen and lateral LEFT mid abdomen and lateral upper LEFT pelvis. Decrease in sizes of abscess collections seen previously. RIGHT lateral abdominal abscess measures 5.7 x 2.0 cm image 45 previously 8.0 x 2.5 cm. Minimal residual fluid surrounding the cranial most drainage catheter, 4.0 x 1.5 cm image 38. Minimal residual fluid at 2 remaining abscesses in the lateral LEFT abdomen/ pelvis. Persistent thickening the lobe soft tissues out the lateral LEFT abdominal wall just above the iliac crest. Significant fluid within rectal stump. Catheter decompresses urinary bladder with note of significant prostatic enlargement. Visualized bowel loops appear normal caliber without dilatation or evidence of obstruction. RIGHT lower quadrant ostomy again noted. Scattered atherosclerotic calcifications. No new abscess collections, free air or free fluid identified. LEFT  inguinal hernia containing fat. Presacral abscess with gas extending to near bone without definite bone destruction. 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 No additional focal osseous abnormalities. IMPRESSION: Decreased sizes of abscess collections seen on previous exam post percutaneous drainage. Small LEFT pleural effusion with bibasilar atelectasis, little changed. Prostatic enlargement. LEFT inguinal hernia. BILATERAL nonobstructing renal calculi. No definite new intra-abdominal or intrapelvic abnormalities. Electronically Signed   By: [name] M.D.   On: 08/31/2015 19:14      Assessment/Plan:  INTERVAL HISTORY:  08/31/15: pt getting ready for repeat CT scan, CCS satisfied with progress with sacral wound 09/01/15: scan showed improvement and one drain (right) removed 09/02/15: Pt tolerating unasyn without problems  Principal Problem:   Critical illness myopathy Active Problems:   Debility   Intra-abdominal abscess (HCC)   Critical illness neuropathy (HCC)   Enterococcal infection   Sacral decubitus ulcer   Hypoalbuminemia due to protein-calorie malnutrition (HCC)   Abscess of abdominal cavity (HCC)    Jonathan Le is a 69 y.o. male with  complicated recent hospital course including intra-abdominal abscesses status post drainage of a care myopathy decubitus ulcers  #1 intra-abdominal abscesses: CT looks improved. One drain DC'd, narrowed to unasyn.   can ultimately be changed to oral augmentin as he nears DC from rehab or sooner if he tolerates IV unasyn and giong to PO will liberate him in rehab from IV   He will need protracted course of abx until all of his abscesses are completely resolved (perhaps another month)  #2 decubitus ulcer : Still need to examine this area, will do so tomorrow around 930 when he has his hydrotherapy.    LOS: 6 days   Acey LavCornelius Van Dam 09/02/2015, 2:50 PM

## 2015-09-02 NOTE — Progress Notes (Signed)
Physical Therapy Wound Treatment Patient Details  Name: Jonathan Le MRN: [id number] Date of Birth: 1946-09-14  Today's Date: 09/02/2015 Time: 4580-9983 Time Calculation (min): 35 min  Subjective  Subjective: Pt reports the second dressing change scheduled in the afternoon is painful Patient and Family Stated Goals: Get better Date of Onset:  (prior to this hospitalization) Prior Treatments: I&D, dressing changes  Pain Score: "I feel fine at the moment" no numerical value given.  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 Subcutaneous fat may be visible but bone, tendon or muscle are NOT exposed. (Active)  Dressing Type ABD;Barrier Film (skin prep);Gauze (Comment);Moist to dry;Tape dressing;Other (Comment) 09/02/2015 10:00 AM  Dressing Changed;Clean;Dry;Intact 09/02/2015 10:00 AM  Dressing Change Frequency Twice a day 09/02/2015 10:00 AM  State of Healing Early/partial granulation 09/02/2015 10:00 AM  Site / Wound Assessment Pink;Red;Yellow 09/02/2015 10:00 AM  % Wound base Red or Granulating 45% 09/02/2015 10:00 AM  % Wound base Yellow 55% 09/02/2015 10:00 AM  % Wound base Black 0% 09/02/2015 10:00 AM  % Wound base Other (Comment) 0% 09/02/2015 10:00 AM  Peri-wound Assessment Intact 09/02/2015 10:00 AM  Wound Length (cm) 8.3 cm 09/01/2015  8:48 AM  Wound Width (cm) 9 cm 09/01/2015  8:48 AM  Wound Depth (cm) 5.5 cm 09/01/2015  8:48 AM  Tunneling (cm) 4 08/27/2015  5:00 PM  Margins Unattached edges (unapproximated) 09/02/2015 10:00 AM  Drainage Amount Moderate 09/02/2015 10:00 AM  Drainage Description Serosanguineous;No odor 09/02/2015 10:00 AM  Treatment Debridement (Selective);Hydrotherapy (Pulse lavage);Off loading;Packing (Saline gauze);Tape changed;Other (Comment) 09/02/2015 10:00 AM        Santyl applied to wound bed prior to applying dressing.  Hydrotherapy Pulsed lavage therapy - wound location: sacrum/buttocks Pulsed  Lavage with Suction (psi): 8 psi (4-8) Pulsed Lavage with Suction - Normal Saline Used: 1000 mL Pulsed Lavage Tip: Tip with splash shield Selective Debridement Selective Debridement - Location: sacrum/buttocks Selective Debridement - Tools Used: Forceps;Scissors Selective Debridement - Tissue Removed: yellow necrotic tissue   Wound Assessment and Plan  Wound Therapy - Assess/Plan/Recommendations Wound Therapy - Clinical Statement: Steady removal of necrotic tissue. Eventually may be able to place VAC on wound. Continue with current plan of care to promote healthy granulatory growth throughout wound. Wound Therapy - Functional Problem List: Pt with decr sitting tolerance due to sacral wound. Factors Delaying/Impairing Wound Healing: Immobility;Multiple medical problems;Polypharmacy Hydrotherapy Plan: Debridement;Dressing change;Patient/family education;Pulsatile lavage with suction Wound Therapy - Frequency: 6X / week Wound Therapy - Follow Up Recommendations: Home health RN Wound Plan: See above  Wound Therapy Goals- Improve the function of patient's integumentary system by progressing the wound(s) through the phases of wound healing (inflammation - proliferation - remodeling) by: Decrease Necrotic Tissue to: 10 Decrease Necrotic Tissue - Progress: Progressing toward goal Increase Granulation Tissue to: 90 Increase Granulation Tissue - Progress: Progressing toward goal  Goals will be updated until maximal potential achieved or discharge criteria met.  Discharge criteria: when goals achieved, discharge from hospital, MD decision/surgical intervention, no progress towards goals, refusal/missing three consecutive treatments without notification or medical reason.  GP     [name] 09/02/2015, 10:33 AM  Elayne Snare, [place]

## 2015-09-02 NOTE — Plan of Care (Signed)
Problem: RH SKIN INTEGRITY Goal: RH STG SKIN FREE OF INFECTION/BREAKDOWN Patient's skin remain free from new infection/breakdown while on Rehab.  Outcome: Progressing Frequent turning during night Goal: RH STG ABLE TO PERFORM INCISION/WOUND CARE W/ASSISTANCE STG Able To Perform Incision/Wound Care With total Assistance.  Outcome: Progressing Abdominal dressing change with hydrogel, sacral wound dressing change with santyl, bilateral heels allyven.

## 2015-09-02 NOTE — Progress Notes (Signed)
Social Work Patient ID: Jonathan Le, male   DOB: [birth year], 69 y.o.   MRN: [id number] Met with pt, sister, brother in-law and wife via telephone to discuss team conference goals-min/mod level of assist and target discharge 11/10. Pt then may still be more than his wife can manage At home, she would like him walking. He has a long way to go to achieve this goal. He is sitting up in a wheelchair and looks much better and he will try to stay up as long as team would like him too. He reports his sacral pain is much better since hydrotherapy started. He has knee pain which limits his therapy and reports this was prior to admission. Team to try ace wrapping and will ask MD regarding injections Since had this before and it really helped. Will work on realistic discharge plan.  Pt would benefit from neuro-psych seeing, will make referral. Did proach the subject of NHP after rehab stay for longer recovery time and more therapies.

## 2015-09-02 NOTE — Progress Notes (Signed)
Occupational Therapy Session Note  Patient Details  Name: Ardyth HarpsHarold D Brunton MRN: [id number] Date of Birth: 09/04/1946  Today's Date: 09/02/2015 OT Individual Time: 1102-1200 OT Individual Time Calculation (min): 58 min    Short Term Goals: Week 1:  OT Short Term Goal 1 (Week 1): Pt will complete bathing with mod assist seated at EOB with assist of 1 caregiver OT Short Term Goal 2 (Week 1): Pt will complete UB dressing with mod assist and min cues for problem solving/organization OT Short Term Goal 3 (Week 1): Pt will complete LB dressing at bed level with max assist OT Short Term Goal 4 (Week 1): Pt will complete sit > stand from EOB with +2 assist to complete 1 grooming task  Skilled Therapeutic Interventions/Progress Updates:    Pt worked on bathing and dressing during session sit to supine.  Min assist for washing front peri area and legs supine with HOB up this session.  Encorporated LH sponge and reacher for washing lower legs and removing gripper socks.  He was not able to remove socks from this position without assist.  Therapist also had to wash his feet.  Pt with transition to sitting EOB with max assist for sidelying to sit after rolling.  He was able to maintain sitting balance EOB with close supervision for most of bathing but did exhibit LOB posteriorly on one occasion thus requiring mod assist to return to midline.  Mod assist for donning pullover shirt over head and pulling down over trunk.  Pt continually being told to push shirt up as close to his shoulders as he can and then try and pull over head.  He continues to only push it part of the way up his arm and then try to pull it over his head.  Pt transferred to wheelchair with total +2 assist using the sliding board.  Educated pt on pressure relief within one hour of sitting.  Nursing also mad aware of need for pressure relief or assist back to bed with maximove if needed.    Therapy Documentation Precautions:   Precautions Precautions: Fall Precaution Comments: continuous pulse ox, trach, O2, sacral decub with increased pain Restrictions Weight Bearing Restrictions: No Other Position/Activity Restrictions: trach collar  Vital Signs: Therapy Vitals Pulse Rate: 100 Resp: 19 Patient Position (if appropriate): Sitting Oxygen Therapy SpO2: 97 % O2 Device: Tracheostomy Collar O2 Flow Rate (L/min): 5 L/min FiO2 (%): 28 % Pain: Pain Assessment Pain Assessment: Faces Pain Score: 0-No pain Faces Pain Scale: Hurts a little bit Pain Type: Acute pain Pain Location: Knee Pain Orientation: Right Pain Descriptors / Indicators: Discomfort Pain Intervention(s): Repositioned;Emotional support ADL: See Function Navigator for Current Functional Status.   Therapy/Group: Individual Therapy  Tashina Credit OTR/L 09/02/2015, 12:59 PM

## 2015-09-02 NOTE — Progress Notes (Signed)
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  Abscess of abdominal cavity (HCC)     .  Enterococcal infection     .  Sacral decubitus ulcer     .  Hypoalbuminemia due to protein-calorie malnutrition (HCC)     .  Critical illness myopathy  08/29/2015   .  Critical illness neuropathy (HCC)  08/29/2015   .  Intra-abdominal abscess (HCC)     .  Debility  08/27/2015   .  Acute on chronic respiratory failure, unspecified whether with hypoxia or hypercapnia (HCC)     .  Encounter for orogastric (OG) tube placement     .  Abdominal abscess (HCC)     .  Abscess     .  Encounter for central line placement     .  Encounter for intubation     .  Respiratory failure (HCC)     .  Septic shock (HCC)     .  Blood poisoning (HCC)     .  Tracheostomy status (HCC)     .  Acute respiratory failure (HCC)     .  Pressure ulcer  06/21/2015   .  Shock (HCC)  06/18/2015   .  Abdominal pain, generalized     .  Shortness of breath     .  Hypoxia     .  S/P CABG x 3  05/22/2015   .  Cardiomyopathy, ischemic  05/21/2015   .  CAD, multiple vessel  05/21/2015   .  Acute systolic congestive heart failure, NYHA class 3 (HCC)  05/21/2015   .  Morbid obesity (HCC)  05/20/2015   .  OSA on CPAP  05/20/2015   .  Other  specified hypotension     .  NSTEMI (non-ST elevated myocardial infarction) (HCC)  05/19/2015   .  Benign prostatic hyperplasia with urinary obstruction  04/24/2015   .  Chronic LBP  12/05/2013   .  Essential hypertension  09/04/2013     Expected Discharge Date: Expected Discharge Date: 09/24/15  Team Members Present: Physician leading conference: Dr. Claudette Laws Social Worker Present: Dossie Der, LCSW Nurse Present: Chana Bode, RN PT Present: Other (comment) Christena Deem) OT Present: Perrin Maltese, OT SLP Present: Jackalyn Lombard, SLP PPS Coordinator present : Tora Duck, RN, CRRN        Current Status/Progress  Goal  Weekly Team Focus   Medical     Anxiety with movement. Severe lower extremity and moderately severe upper extremity weakness due to critical illness mmyopathy and neuropathy   One-person assist  Increase activity tolerance  including upright, tilted    Bowel/Bladder     Foley-  unstageable wound to sacrum; ileostomy- good output   Total assist  Assess ostomy for potential problems    Swallow/Nutrition/ Hydration       na         ADL's     min assist for UB bathing, mod assist for UB dressing.  He needs mod assist for rolling in bed during LB selfcare with total assist for LB bathing and dressing.  Max to total assist for supine to sit EOB.  Have not attempted OOB activity secondary to pa having too much pain in his buttocks in sitting.    overall min to mod assist  selfcare re-training, transfer training, pt/family education, balance re-training, increasing OOB tolerance   Mobility     +2 for bed mobilty, lateral slide transfer on MaxiSlide to tilt table; tolerance x 1 minute of 30 degrees upright on tilt table   supervision bed mobility; mod assist basic and car transfers, mod assist gait x 50' with therapists only; supervision w/c propulsion x 150' controlled, 75' home env, and min assist  x 200' community setting  bed mobilty, pre gait, transfers,  tolerance of upright position on tilt table, pt ed, tolerance of sitting upright in w/c   Communication     tolerating [place] speaking valve during all waking hours but needs cues for recall of care and cleaning of valve, donning and doffing   mod I   continue education    Safety/Cognition/ Behavioral Observations    fluctuates, varies between supervision to max assist depending on medication effects and lethargy   supervision   continue to address basic to semi-complex problem solving     Pain     Denies pain         Skin     Multiple wounds; unstageable to heel, sacrum-hydrotherapy and santyl in use; midline incision to abdomen with hydrogel   No further skin breakdown or infection with max assist.   Assess skin q shift and prn; perform dressing changes per MD order      *See Care Plan and progress notes for long and short-term goals.    Barriers to Discharge:  Patient fears  movement complaints of pain with movement in multiple areas     Possible Resolutions to Barriers:   Voltaren gel for knee pain     Discharge Planning/Teaching Needs:   Discharge plan is dependent upon pt's progress here, wife can not do much physical care for pt. Sister is supportive but can not assist. Probable plan is NHP       Team Discussion:    Goals-min/mod level, extremely deconditioned-difficulty sitting up. Daily hydrotherapy for sacral wound. Pulmonary following needs to be stronger before discharging trach. Knee pain limits him. Activity tolerance poor working up this. Would benefit from neuro-psych seeing. Tilt-table tried pt didn't like.   Revisions to Treatment Plan:    None    Continued Need for Acute Rehabilitation Level of Care: The patient requires daily medical management by a physician with specialized training in physical medicine and rehabilitation for the following conditions: Daily direction of a multidisciplinary physical rehabilitation program to ensure safe treatment while  eliciting the highest outcome that is of practical value to the patient.: Yes Daily medical management of patient stability for increased activity during participation in an intensive rehabilitation regime.: Yes Daily analysis of laboratory values and/or radiology reports with any subsequent need for medication adjustment of medical intervention for : Neurological problems;Other;Post surgical problems  Jonathan Le, Jonathan Le 09/02/2015, 1:11  PM                  Patient ID: Jonathan Le, male   DOB: 11/28/1945, 69 y.o.   MRN: [id number]

## 2015-09-02 NOTE — Progress Notes (Signed)
Speech Language Pathology Daily Session Note  Patient Details  Name: Ardyth HarpsHarold D Crowell MRN: [id number] Date of Birth: 12/10/1945  Today's Date: 09/02/2015 SLP Individual Time: 1400-1455 SLP Individual Time Calculation (min): 55 min  Short Term Goals: Week 1: SLP Short Term Goal 1 (Week 1): Pt will return demonstration of how to donn/doff speaking valve over 3 targeted sessions with supervision.  SLP Short Term Goal 2 (Week 1): Pt will recall steps of how to clean speaking valve over 3 targeted sessions with supervision.  SLP Short Term Goal 3 (Week 1): Pt will utliize compensatory strategies to facilitate recall of daily information with min assist verbal cues.   SLP Short Term Goal 4 (Week 1): Pt will complete basic to semi-complex self care and/or home management tasks with min assist verbal cues for functional problem solving  SLP Short Term Goal 5 (Week 1): Pt will recognize and correct errors in the moment during functional tasks with min assist verbal cues.   Skilled Therapeutic Interventions:  Pt was seen for skilled ST targeting cognitive goals.  SLP facilitated the session with continued practice for functional problem solving during a catalog shopping task.  Pt required mod assist verbal cues for organization and attention to task to locate items from catalog when given a list of criteria and a budget.  Pt also required mod-max assist verbal and visual cues to recognize and correct errors when completing functional math calculations with a calculator.  Pt exhibited behaviors today consistent with avoidance of therapies (keeping eyes closed, tangential output/poor topic maintenance); therefore, SLP explained rationale behind ST interventions and CIR admission for maximizing functional independence and reduce burden of care prior to discharge.  Pt's family present and reported that pt has been consistently attempting to avoid all therapies.  Pt was left in bed with family at bedside and call  bell left within reach.     Function:  Eating Eating       Cognition Comprehension Comprehension assist level: Understands basic 75 - 89% of the time/ requires cueing 10 - 24% of the time  Expression Expression assistive device: Talk trach valve Expression assist level: Expresses basic 90% of the time/requires cueing < 10% of the time.  Social Interaction Social Interaction assist level: Interacts appropriately 75 - 89% of the time - Needs redirection for appropriate language or to initiate interaction.  Problem Solving Problem solving assist level: Solves basic 50 - 74% of the time/requires cueing 25 - 49% of the time  Memory Memory assist level: Recognizes or recalls 50 - 74% of the time/requires cueing 25 - 49% of the time    Pain Pain Assessment Pain Assessment: No/denies pain   Therapy/Group: Individual Therapy  Michalla Ringer, Melanee SpryNicole L 09/02/2015, 4:12 PM

## 2015-09-02 NOTE — Progress Notes (Signed)
Subjective/Complaints: Pt sleeping better, no c/o sacral pain today Appreciate ID note  Right abd drain out  Review systems negative for lower extremity pain, positive for numbness in feet positive for sacral pain when he lays on his back  Objective: Vital Signs: Blood pressure 106/85, pulse 113, temperature 98.3 F (36.8 C), temperature source Oral, resp. rate 20, height '5\' 9"'  (1.753 m), weight 113 kg (249 lb 1.9 oz), SpO2 95 %. Ct Abdomen Pelvis Wo Contrast  08/31/2015  CLINICAL DATA:  Intra-abdominal abscess, sacral decubitus, hypertension, CHF, prior CABG, morbid obesity EXAM: CT ABDOMEN AND PELVIS WITHOUT CONTRAST TECHNIQUE: Multidetector CT imaging of the abdomen and pelvis was performed following the standard protocol without IV contrast. Sagittal and coronal MPR images reconstructed from axial data set. Patient refused IV contrast. Small amount of oral contrast is present on the exam. COMPARISON:  08/21/2015 FINDINGS: LEFT pleural effusion. BILATERAL lower lobe atelectasis, cannot exclude infiltrate in LEFT lower lobe. Liver, spleen, pancreas, and adrenal glands unremarkable for technique. BILATERAL nonobstructing renal calculi. Pigtail drainage catheters lateral RIGHT mid abdomen and lateral LEFT mid abdomen and lateral upper LEFT pelvis. Decrease in sizes of abscess collections seen previously. RIGHT lateral abdominal abscess measures 5.7 x 2.0 cm image 45 previously 8.0 x 2.5 cm. Minimal residual fluid surrounding the cranial most drainage catheter, 4.0 x 1.5 cm image 38. Minimal residual fluid at 2 remaining abscesses in the lateral LEFT abdomen/ pelvis. Persistent thickening the lobe soft tissues out the lateral LEFT abdominal wall just above the iliac crest. Significant fluid within rectal stump. Catheter decompresses urinary bladder with note of significant prostatic enlargement. Visualized bowel loops appear normal caliber without dilatation or evidence of obstruction. RIGHT lower  quadrant ostomy again noted. Scattered atherosclerotic calcifications. No new abscess collections, free air or free fluid identified. LEFT inguinal hernia containing fat. Presacral abscess with gas extending to near bone without definite bone destruction. 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 No additional focal osseous abnormalities. IMPRESSION: Decreased sizes of abscess collections seen on previous exam post percutaneous drainage. Small LEFT pleural effusion with bibasilar atelectasis, little changed. Prostatic enlargement. LEFT inguinal hernia. BILATERAL nonobstructing renal calculi. No definite new intra-abdominal or intrapelvic abnormalities. Electronically Signed   By: Lavonia Dana M.D.   On: 08/31/2015 19:14   Results for orders placed or performed during the hospital encounter of 08/27/15 (from the past 72 hour(s))  Glucose, capillary     Status: Abnormal   Collection Time: 08/30/15 11:41 AM  Result Value Ref Range   Glucose-Capillary 108 (H) 65 - 99 mg/dL   Comment 1 Notify RN   Vitamin B12     Status: None   Collection Time: 08/30/15 11:45 AM  Result Value Ref Range   Vitamin B-12 451 180 - 914 pg/mL    Comment: (NOTE) This assay is not validated for testing neonatal or myeloproliferative syndrome specimens for Vitamin B12 levels.   Magnesium     Status: Abnormal   Collection Time: 08/30/15 11:45 AM  Result Value Ref Range   Magnesium 1.5 (L) 1.7 - 2.4 mg/dL  Glucose, capillary     Status: Abnormal   Collection Time: 08/30/15  4:58 PM  Result Value Ref Range   Glucose-Capillary 110 (H) 65 - 99 mg/dL  Glucose, capillary     Status: Abnormal   Collection Time: 08/30/15  8:47 PM  Result Value Ref Range   Glucose-Capillary 116 (H) 65 - 99 mg/dL  Protime-INR  Status: Abnormal   Collection Time: 08/31/15  4:45 AM  Result Value Ref Range   Prothrombin Time 22.0 (H) 11.6 - 15.2 seconds   INR 1.93 (H) 0.00 - 4.12  Basic metabolic  panel     Status: Abnormal   Collection Time: 08/31/15  4:45 AM  Result Value Ref Range   Sodium 135 135 - 145 mmol/L   Potassium 4.0 3.5 - 5.1 mmol/L   Chloride 98 (L) 101 - 111 mmol/L   CO2 32 22 - 32 mmol/L   Glucose, Bld 94 65 - 99 mg/dL   BUN 10 6 - 20 mg/dL   Creatinine, Ser 0.33 (L) 0.61 - 1.24 mg/dL   Calcium 8.1 (L) 8.9 - 10.3 mg/dL   GFR calc non Af Amer >60 >60 mL/min   GFR calc Af Amer >60 >60 mL/min    Comment: (NOTE) The eGFR has been calculated using the CKD EPI equation. This calculation has not been validated in all clinical situations. eGFR's persistently <60 mL/min signify possible Chronic Kidney Disease.    Anion gap 5 5 - 15  Glucose, capillary     Status: None   Collection Time: 08/31/15  6:52 AM  Result Value Ref Range   Glucose-Capillary 96 65 - 99 mg/dL  Glucose, capillary     Status: Abnormal   Collection Time: 08/31/15 12:10 PM  Result Value Ref Range   Glucose-Capillary 101 (H) 65 - 99 mg/dL  Glucose, capillary     Status: Abnormal   Collection Time: 08/31/15  5:03 PM  Result Value Ref Range   Glucose-Capillary 100 (H) 65 - 99 mg/dL  Glucose, capillary     Status: Abnormal   Collection Time: 08/31/15  8:58 PM  Result Value Ref Range   Glucose-Capillary 114 (H) 65 - 99 mg/dL  Protime-INR     Status: Abnormal   Collection Time: 09/01/15  4:25 AM  Result Value Ref Range   Prothrombin Time 20.3 (H) 11.6 - 15.2 seconds   INR 1.73 (H) 0.00 - 1.49  Glucose, capillary     Status: None   Collection Time: 09/01/15  6:50 AM  Result Value Ref Range   Glucose-Capillary 78 65 - 99 mg/dL  Magnesium     Status: Abnormal   Collection Time: 09/01/15  9:40 AM  Result Value Ref Range   Magnesium 1.6 (L) 1.7 - 2.4 mg/dL  Glucose, capillary     Status: Abnormal   Collection Time: 09/01/15 12:40 PM  Result Value Ref Range   Glucose-Capillary 128 (H) 65 - 99 mg/dL  Glucose, capillary     Status: Abnormal   Collection Time: 09/01/15  4:54 PM  Result Value Ref  Range   Glucose-Capillary 109 (H) 65 - 99 mg/dL  Glucose, capillary     Status: None   Collection Time: 09/01/15  9:00 PM  Result Value Ref Range   Glucose-Capillary 97 65 - 99 mg/dL  Protime-INR     Status: Abnormal   Collection Time: 09/02/15  2:31 AM  Result Value Ref Range   Prothrombin Time 20.7 (H) 11.6 - 15.2 seconds   INR 1.79 (H) 0.00 - 1.49  Glucose, capillary     Status: None   Collection Time: 09/02/15  7:05 AM  Result Value Ref Range   Glucose-Capillary 94 65 - 99 mg/dL    Nursing note and vitals reviewed. Constitutional: He is oriented to person, place, and time.he is slower today He appears well-developed and well-nourished. No distress.  HENT:  Head: Normocephalic and atraumatic.  Mouth/Throat: Oropharynx is clear and moist.  Eyes: Conjunctivae and EOM are normal. Pupils are equal, round, and reactive to light.  Neck: Decreased range of motion present.  Trach site with dry dressing and ATC in place.  Cardiovascular: Normal rate and regular rhythm. noES Respiratory: Effort normal and breath sounds normal. No respiratory distress. He has no wheezes. He exhibits no tenderness.  Sternotomy incision is clean,dry and intact--healing well.  GI: Soft. Bowel sounds are normal.  Midline incision has healed to surface but not epithealized yet. Ostomy with bilious drainage. Multiple drains LLQ sutured in place and mild irritation at entry site.  Genitourinary:  Min scrotal edema noted. Foley in place.  Musculoskeletal: He exhibits edema (LLE with 1+ edema tibially and 2+ pedally. RLE with 1+ edema pedally. ). He exhibits no tenderness.  Right knee with 1-2+ effusion. No tenderness with ROM. Tends to keep RLE rotated outward. Skin: Grade 4 sacral decubitus with good granulation tissue no evidence of necrotic tissue. No drainage except for some serosanguineous on gauze.  Neurological: He is alert and oriented to person, place, and time.  Able to phonate well with  PMSV. Speech clear. Follows basic commands without difficulty.  Skin: Skin is warm and dry. He is not diaphoretic.  Right lower shin with stasis changes. Multiple ulcers on bilateral heels,the right heel blister is partially hemorrhagic, left heel has a small eschar right foot, left thigh covered with foam and opsite. Psychiatric: He has a normal mood and affect. His speech is normal and behavior is normal. Cognition and memory are normal.  Motor strength is 3 minus bilateral deltoid 4 minus bicep tricep grip, 2 minus bilateral hip flexors 3 minus knee extensors 3 minus bilateral ankle dorsiflexor plantar flexor Sensation is absent in bilateral feet to light touch. Present at the knees Fingers has some difficulty identifying light touch    Assessment/Plan: 1. 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 Physiatrist is providing close team supervision and 24 hour management of active medical problems listed below. Physiatrist and rehab team continue to assess barriers to discharge/monitor patient progress toward functional and medical goals. Team conference today please see physician documentation under team conference tab, met with team face-to-face to discuss problems,progress, and goals. Formulized individual treatment plan based on medical history, underlying problem and comorbidities. FIM: Function - Bathing Position: Sitting EOB Body parts bathed by patient: Right arm, Left arm, Chest, Abdomen, Right upper leg, Left upper leg, Right lower leg, Left lower leg Body parts bathed by helper: Front perineal area, Buttocks, Back Bathing not applicable: Front perineal area, Buttocks (RN just cleaned and changed dressing on buttocks) Assist Level: 2 helpers  Function- Upper Body Dressing/Undressing What is the patient  wearing?: Pull over shirt/dress Pull over shirt/dress - Perfomed by patient: Thread/unthread right sleeve, Thread/unthread left sleeve Pull over shirt/dress - Perfomed by helper: Put head through opening, Pull shirt over trunk Assist Level: 2 helpers Function - Lower Body Dressing/Undressing What is the patient wearing?: Pants Position: Bed Pants- Performed by helper: Thread/unthread right pants leg, Thread/unthread left pants leg, Pull pants up/down Non-skid slipper socks- Performed by helper: Don/doff right sock, Don/doff left sock Assist for footwear: Dependant Assist for lower body dressing: 2 Helpers  Function - Toileting Toileting activity did not occur: Safety/medical concerns (ileostomy/foley) Assist level: Two helpers (per [place], NT report)  Function -  Air cabin crew transfer activity did not occur: Safety/medical concerns (ileostomy/foley)  Function - Chair/bed transfer Chair/bed transfer activity did not occur: Safety/medical concerns Chair/bed transfer method: Lateral scoot Chair/bed transfer assist level: 2 helpers Chair/bed transfer assistive device: Sliding board  Function - Locomotion: Wheelchair Will patient use wheelchair at discharge?: Yes Type: Manual Wheelchair activity did not occur: Safety/medical concerns Max wheelchair distance: 40 Assist Level: Maximal assistance (Pt 25 - 49%) Wheel 50 feet with 2 turns activity did not occur: Safety/medical concerns Wheel 150 feet activity did not occur: Safety/medical concerns Function - Locomotion: Ambulation Ambulation activity did not occur: Safety/medical concerns Walk 10 feet activity did not occur: Safety/medical concerns Walk 50 feet with 2 turns activity did not occur: Safety/medical concerns Walk 150 feet activity did not occur: Safety/medical concerns Walk 10 feet on uneven surfaces activity did not occur: Safety/medical concerns  Function - Comprehension Comprehension: Auditory Comprehension  assist level: Understands basic 75 - 89% of the time/ requires cueing 10 - 24% of the time  Function - Expression Expression: Verbal Expression assistive device: Talk trach valve Expression assist level: Expresses basic 90% of the time/requires cueing < 10% of the time.  Function - Social Interaction Social Interaction assist level: Interacts appropriately 75 - 89% of the time - Needs redirection for appropriate language or to initiate interaction.  Function - Problem Solving Problem solving assist level: Solves basic 25 - 49% of the time - needs direction more than half the time to initiate, plan or complete simple activities  Function - Memory Memory assist level: Recognizes or recalls 25 - 49% of the time/requires cueing 50 - 75% of the time Patient normally able to recall (first 3 days only): Current season, Location of own room, Staff names and faces, That he or she is in a hospital  1. 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 LLE DVT treatment /Anticoagulation: Pharmaceutical: Coumadin 3. Pain Management: Tylenol for prn for pain. Will continue oxycodone prn if needed for severe pain. 4. Mood: LCSW to follow for evaluation and support.  5. Neuropsych: This patient is capable of making decisions on his own behalf. 6. Skin/Wound Care:  appreciate.general surgery for sacral wound debridement, PRAFO ordered for heels, Cont air bed , Santyl collagenase for sacral decubitus. Hydrotherapy for physical therapy 7. Fluids/Electrolytes/Nutrition: Monitor I/O. Will consult dietician to help with food choices. Increase prosource to TID. Added ensure between meals as unable to tolerate a lot of food at one time.  8. Abdominal abscess: Continue drain care bid. On Daptomycin --question  duration. Flagyl to contiue till 10/26 per records. ID on consult, Pt refused contrast has some improvement of at least one abscess, s/p drain removal 9. Hypokalemia: Improved with supplement in past 24 hours.still requires additional oral potassium  10. VDRF/ History of OSA: To continue current trach till mobility improves per CCM. Tolerating humidified air. We will need to reconsult pulmonary before decannulation, will wait until strength improves 11. Anxiety disorder: Will resume low dose Xanax as used this for insomnia at home. given his respiratory compromise will need to limit dosing 12. Anemia: Likely due to infection/chronic illness. Hgb stable at 8.2, will recheck 13. Anasarca: Improved with diuresis. Continue Lasix.  14. Diastolic CHF: Off amiodarone, Ace and statin at this time due to recurrent hypotension and multiple medical issues. Monitor for signs of overload. Check  daily weights. Monitor BP bid.  15.  Hypomagnesemia-labs personally reviewed. increase supplement to 853m BID, repeat Mg level on 10/17 still low cont current dose with recheck next week LOS (Days) 6 A FACE TO FACE EVALUATION WAS PERFORMED   Rosalie Buenaventura E 09/02/2015, 7:43 AM

## 2015-09-03 ENCOUNTER — Inpatient Hospital Stay (HOSPITAL_COMMUNITY): Payer: Medicare Other

## 2015-09-03 ENCOUNTER — Inpatient Hospital Stay (HOSPITAL_COMMUNITY): Payer: Medicare Other | Admitting: Speech Pathology

## 2015-09-03 ENCOUNTER — Inpatient Hospital Stay (HOSPITAL_COMMUNITY): Payer: Medicare Other | Admitting: Occupational Therapy

## 2015-09-03 DIAGNOSIS — K651 Peritoneal abscess: Secondary | ICD-10-CM

## 2015-09-03 DIAGNOSIS — G7281 Critical illness myopathy: Secondary | ICD-10-CM

## 2015-09-03 DIAGNOSIS — R5381 Other malaise: Secondary | ICD-10-CM

## 2015-09-03 DIAGNOSIS — G6281 Critical illness polyneuropathy: Secondary | ICD-10-CM

## 2015-09-03 DIAGNOSIS — L89154 Pressure ulcer of sacral region, stage 4: Secondary | ICD-10-CM

## 2015-09-03 LAB — GLUCOSE, CAPILLARY
GLUCOSE-CAPILLARY: 108 mg/dL — AB (ref 65–99)
GLUCOSE-CAPILLARY: 130 mg/dL — AB (ref 65–99)
Glucose-Capillary: 124 mg/dL — ABNORMAL HIGH (ref 65–99)

## 2015-09-03 LAB — PROTIME-INR
INR: 2.02 — ABNORMAL HIGH (ref 0.00–1.49)
PROTHROMBIN TIME: 22.7 s — AB (ref 11.6–15.2)

## 2015-09-03 MED ORDER — TRAMADOL HCL 50 MG PO TABS
50.0000 mg | ORAL_TABLET | Freq: Four times a day (QID) | ORAL | Status: DC | PRN
Start: 2015-09-03 — End: 2015-09-05
  Administered 2015-09-04: 50 mg via ORAL
  Filled 2015-09-03: qty 1

## 2015-09-03 MED ORDER — WARFARIN SODIUM 5 MG PO TABS
5.0000 mg | ORAL_TABLET | Freq: Once | ORAL | Status: AC
  Administered 2015-09-03: 5 mg via ORAL
  Filled 2015-09-03: qty 1

## 2015-09-03 MED ORDER — ENSURE ENLIVE PO LIQD
237.0000 mL | ORAL | Status: DC
  Administered 2015-09-05 – 2015-09-12 (×4): 237 mL via ORAL

## 2015-09-03 NOTE — Progress Notes (Signed)
Nursing reported red bloody drainage via ileostomy.  Stool hemoccult done and was negative. Patient reported having red jello for lunch today which has likely colored the output. Will monitor for now. Patient denies any abdominal symptoms.   Wife called nurse with concerns about patient's medications as she feels that this makes him tired and confused.  Patient has had insomnia and wife is aware but she feels that this is because he is "lonely".  Discussed with patient who agrees with d/c oxycodone, xanax and trazodone. Will try ultram prn pain.

## 2015-09-03 NOTE — Progress Notes (Signed)
Patient ID: Jonathan HarpsHarold D Guay, male   DOB: 09/23/1946, 69 y.o.   MRN: [id number]    Subjective: Pt feels well today  Objective: Vital signs in last 24 hours: Temp:  [98 F (36.7 C)-98.2 F (36.8 C)] 98 F (36.7 C) (10/20 0610) Pulse Rate:  [95-112] 96 (10/20 0833) Resp:  [18-20] 18 (10/20 0833) BP: (103-110)/(65-75) 103/65 mmHg (10/20 0610) SpO2:  [92 %-98 %] 94 % (10/20 0833) FiO2 (%):  [28 %] 28 % (10/20 0833) Weight:  [114.034 kg (251 lb 6.4 oz)] 114.034 kg (251 lb 6.4 oz) (10/20 0610) Last BM Date: 09/03/15  Intake/Output from previous day: 10/19 0701 - 10/20 0700 In: 630 [P.O.:600; I.V.:30] Out: 3525 [Urine:2550; Drains:125; Stool:850] Intake/Output this shift:    PE: Skin: wound is improving with some granulation tissue present especially on the outer and more superficial portions.  He still has quite a bit of fibrin deep in the wound.  Lab Results:  No results for input(s): WBC, HGB, HCT, PLT in the last 72 hours. BMET No results for input(s): NA, K, CL, CO2, GLUCOSE, BUN, CREATININE, CALCIUM in the last 72 hours. PT/INR  Recent Labs  09/02/15 0231 09/03/15 0450  LABPROT 20.7* 22.7*  INR 1.79* 2.02*   CMP     Component Value Date/Time   NA 135 08/31/2015 0445   K 4.0 08/31/2015 0445   CL 98* 08/31/2015 0445   CO2 32 08/31/2015 0445   GLUCOSE 94 08/31/2015 0445   BUN 10 08/31/2015 0445   CREATININE 0.33* 08/31/2015 0445   CALCIUM 8.1* 08/31/2015 0445   PROT 5.2* 08/28/2015 0430   ALBUMIN 1.6* 08/28/2015 0430   AST 22 08/28/2015 0430   ALT 10* 08/28/2015 0430   ALKPHOS 80 08/28/2015 0430   BILITOT 0.6 08/28/2015 0430   GFRNONAA >60 08/31/2015 0445   GFRAA >60 08/31/2015 0445   Lipase     Component Value Date/Time   LIPASE 15* 05/19/2015 2140       Studies/Results: No results found.  Anti-infectives: Anti-infectives    Start     Dose/Rate Route Frequency Ordered Stop   09/01/15 1800  Ampicillin-Sulbactam (UNASYN) 3 g in sodium chloride 0.9  % 100 mL IVPB     3 g 100 mL/hr over 60 Minutes Intravenous Every 6 hours 09/01/15 1539     08/27/15 2200  meropenem (MERREM) 1 g in sodium chloride 0.9 % 100 mL IVPB  Status:  Discontinued     1 g 200 mL/hr over 30 Minutes Intravenous 3 times per day 08/27/15 1658 09/01/15 1436   08/27/15 1615  metroNIDAZOLE (FLAGYL) tablet 500 mg  Status:  Discontinued     500 mg Oral 3 times per day 08/27/15 1610 08/28/15 1651       Assessment/Plan 1. S/p subtotal abdominal colectomy with ileostomy and multiple intra-abdominal fluid collections, s/p perc drains -right sided drain removed by IR earlier this week.  3 left sided drains remain in place 2. Stage 4 sacral decubitus wound -cont current care with santyl and BID dressing changes with hydrotherapy.  Will re-eval on Monday   LOS: 7 days    Kadir Azucena E 09/03/2015, 8:59 AM Pager: [phone]

## 2015-09-03 NOTE — Progress Notes (Signed)
Physical Therapy Wound Treatment Patient Details  Name: Jonathan Le MRN: [id number] Date of Birth: [birth year]  Today's Date: 09/03/2015 Time: 1157-2620 Time Calculation (min): 26 min  Subjective  Subjective: States wound has been "leaking" a lot lately. Patient and Family Stated Goals: Get better Date of Onset:  (prior to this hospitalization) Prior Treatments: I&D, dressing changes  Pain Score: 0  Wound Assessment     Pressure Ulcer 07/21/15 Stage III -  Full thickness tissue loss. Subcutaneous fat may be visible but bone, tendon or muscle are NOT exposed. (Active)  Dressing Type ABD;Barrier Film (skin prep);Gauze (Comment);Moist to dry;Tape dressing;Other (Comment) 09/03/2015 10:00 AM  Dressing Changed;Clean;Dry;Intact 09/03/2015 10:00 AM  Dressing Change Frequency Twice a day 09/03/2015 10:00 AM  State of Healing Early/partial granulation 09/03/2015 10:00 AM  Site / Wound Assessment Pink;Red;Yellow 09/03/2015 10:00 AM  % Wound base Red or Granulating 45% 09/03/2015 10:00 AM  % Wound base Yellow 55% 09/03/2015 10:00 AM  % Wound base Black 0% 09/03/2015 10:00 AM  % Wound base Other (Comment) 0% 09/03/2015 10:00 AM  Peri-wound Assessment Intact 09/03/2015 10:00 AM  Wound Length (cm) 8.3 cm 09/01/2015  8:48 AM  Wound Width (cm) 9 cm 09/01/2015  8:48 AM  Wound Depth (cm) 5.5 cm 09/01/2015  8:48 AM  Tunneling (cm) 4 08/27/2015  5:00 PM  Margins Unattached edges (unapproximated) 09/03/2015 10:00 AM  Drainage Amount Copious 09/03/2015 10:00 AM  Drainage Description Serosanguineous;No odor 09/03/2015 10:00 AM  Treatment Debridement (Selective);Hydrotherapy (Pulse lavage);Packing (Saline gauze);Tape changed;Other (Comment) 09/03/2015 10:00 AM     Santyl applied to wound bed prior to applying dressing.  Hydrotherapy Pulsed lavage therapy - wound location: sacrum/buttocks Pulsed Lavage with Suction (psi): 8 psi (4-8) Pulsed Lavage with Suction - Normal Saline Used: 1000  mL Pulsed Lavage Tip: Tip with splash shield Selective Debridement Selective Debridement - Location: sacrum/buttocks Selective Debridement - Tools Used: Forceps;Scissors Selective Debridement - Tissue Removed: yellow necrotic tissue   Wound Assessment and Plan  Wound Therapy - Assess/Plan/Recommendations Wound Therapy - Clinical Statement: Continue with current plan of care. Additional yellow necrotic tissue removed. Santyl applied for enzymatic debridement. Wound Therapy - Functional Problem List: Pt with decr sitting tolerance due to sacral wound. Factors Delaying/Impairing Wound Healing: Immobility;Multiple medical problems;Polypharmacy Hydrotherapy Plan: Debridement;Dressing change;Patient/family education;Pulsatile lavage with suction Wound Therapy - Frequency: 6X / week Wound Therapy - Follow Up Recommendations: Home health RN Wound Plan: See above  Wound Therapy Goals- Improve the function of patient's integumentary system by progressing the wound(s) through the phases of wound healing (inflammation - proliferation - remodeling) by: Decrease Necrotic Tissue to: 10 Decrease Necrotic Tissue - Progress: Progressing toward goal Increase Granulation Tissue to: 90 Increase Granulation Tissue - Progress: Progressing toward goal  Goals will be updated until maximal potential achieved or discharge criteria met.  Discharge criteria: when goals achieved, discharge from hospital, MD decision/surgical intervention, no progress towards goals, refusal/missing three consecutive treatments without notification or medical reason.  GP     [name] 09/03/2015, 10:11 AM Elayne Snare, [place]

## 2015-09-03 NOTE — Progress Notes (Signed)
Patient seen for trach team follow up.  No education needed at this time.  Patient asked me to ensure PMV was on correctly;  PMV removed, and replaced with no difficulty.  No disposable inner cannulas in room at this time.  RN aware, and is ordering more from supply.  Will continue to follow.

## 2015-09-03 NOTE — Progress Notes (Signed)
ANTICOAGULATION CONSULT NOTE - Follow Up Consult  Pharmacy Consult for Coumadin Indication: DVT  Allergies  Allergen Reactions  . Levaquin [Levofloxacin] Nausea Only  . Penicillins Hives  . Doxycycline Rash    Patient Measurements: Height: 5\' 9"  (175.3 cm) Weight: 251 lb 6.4 oz (114.034 kg) IBW/kg (Calculated) : 70.7  Vital Signs: Temp: 98 F (36.7 C) (10/20 0610) Temp Source: Oral (10/20 0610) BP: 103/65 mmHg (10/20 0610) Pulse Rate: 96 (10/20 0833)  Labs:  Recent Labs  09/01/15 0425 09/02/15 0231 09/03/15 0450  LABPROT 20.3* 20.7* 22.7*  INR 1.73* 1.79* 2.02*    Estimated Creatinine Clearance: 106.9 mL/min (by C-G formula based on Cr of 0.33).   Assessment: 69 YOM continued on coumadin for new LLE DVT (08/05/2015). INR 2.02, therapeutic today.  No new CBC since 10/14.  No bleeding noted per chart. Flagyl was stopped by ID doctor on 10/14, thus anticipate higher coumadin dose post flagyl therapy may be needed to keep his INR therapeutic.   Goal of Therapy:  INR 2-3 Monitor platelets by anticoagulation protocol: Yes   Plan:  Warfarin 5 mg po daily Daily INR  Noah Delaineuth Anahy Esh, RPh Clinical Pharmacist Pager: [phone]  09/03/2015,10:37 AM

## 2015-09-03 NOTE — Progress Notes (Signed)
Subjective/Complaints: Patient's a well overnight. He would like to know when his drains are going to be removed and he is hopeful his wounds will heal quickly  ROS: Denies CP, SOB, n/v/d.   Objective: Vital Signs: Blood pressure 104/64, pulse 101, temperature 97.8 F (36.6 C), temperature source Oral, resp. rate 18, height '5\' 9"'  (1.753 m), weight 114.034 kg (251 lb 6.4 oz), SpO2 97 %. No results found. Results for orders placed or performed during the hospital encounter of 08/27/15 (from the past 72 hour(s))  Glucose, capillary     Status: Abnormal   Collection Time: 08/31/15  5:03 PM  Result Value Ref Range   Glucose-Capillary 100 (H) 65 - 99 mg/dL  Glucose, capillary     Status: Abnormal   Collection Time: 08/31/15  8:58 PM  Result Value Ref Range   Glucose-Capillary 114 (H) 65 - 99 mg/dL  Protime-INR     Status: Abnormal   Collection Time: 09/01/15  4:25 AM  Result Value Ref Range   Prothrombin Time 20.3 (H) 11.6 - 15.2 seconds   INR 1.73 (H) 0.00 - 1.49  Glucose, capillary     Status: None   Collection Time: 09/01/15  6:50 AM  Result Value Ref Range   Glucose-Capillary 78 65 - 99 mg/dL  Magnesium     Status: Abnormal   Collection Time: 09/01/15  9:40 AM  Result Value Ref Range   Magnesium 1.6 (L) 1.7 - 2.4 mg/dL  Glucose, capillary     Status: Abnormal   Collection Time: 09/01/15 12:40 PM  Result Value Ref Range   Glucose-Capillary 128 (H) 65 - 99 mg/dL  Glucose, capillary     Status: Abnormal   Collection Time: 09/01/15  4:54 PM  Result Value Ref Range   Glucose-Capillary 109 (H) 65 - 99 mg/dL  Glucose, capillary     Status: None   Collection Time: 09/01/15  9:00 PM  Result Value Ref Range   Glucose-Capillary 97 65 - 99 mg/dL  Protime-INR     Status: Abnormal   Collection Time: 09/02/15  2:31 AM  Result Value Ref Range   Prothrombin Time 20.7 (H) 11.6 - 15.2 seconds   INR 1.79 (H) 0.00 - 1.49  Glucose, capillary     Status: None   Collection Time: 09/02/15   7:05 AM  Result Value Ref Range   Glucose-Capillary 94 65 - 99 mg/dL  Glucose, capillary     Status: Abnormal   Collection Time: 09/02/15 12:21 PM  Result Value Ref Range   Glucose-Capillary 118 (H) 65 - 99 mg/dL  Glucose, capillary     Status: Abnormal   Collection Time: 09/02/15  5:19 PM  Result Value Ref Range   Glucose-Capillary 202 (H) 65 - 99 mg/dL  Glucose, capillary     Status: Abnormal   Collection Time: 09/02/15  5:24 PM  Result Value Ref Range   Glucose-Capillary 108 (H) 65 - 99 mg/dL  Glucose, capillary     Status: None   Collection Time: 09/02/15  9:59 PM  Result Value Ref Range   Glucose-Capillary 99 65 - 99 mg/dL  Protime-INR     Status: Abnormal   Collection Time: 09/03/15  4:50 AM  Result Value Ref Range   Prothrombin Time 22.7 (H) 11.6 - 15.2 seconds   INR 2.02 (H) 0.00 - 1.49  Glucose, capillary     Status: Abnormal   Collection Time: 09/03/15 12:23 PM  Result Value Ref Range   Glucose-Capillary 124 (H) 65 -  99 mg/dL   Comment 1 Notify RN     Nursing note and vitals reviewed. Constitutional: He is oriented to person, place, and time.he is slower today He appears well-developed and well-nourished. No distress.  HENT:  Head: Normocephalic and atraumatic.  Mouth/Throat: Oropharynx is clear and moist.  Eyes: Conjunctivae and EOM are normal.   Neck: Decreased range of motion present.  Trach site with dry dressing and ATC in place.  Cardiovascular: Normal rate and regular rhythm. noES Respiratory: Effort normal and breath sounds normal. No respiratory distress. He has no wheezes. He exhibits no tenderness.  Sternotomy incision is clean,dry and intact--healing well.  GI: Soft. Bowel sounds are normal.  Midline incision has healed to surface but not epithealized yet. Ostomy with bilious drainage. Multiple drains LLQ sutured in place and mild irritation at entry site.  Genitourinary:  Min scrotal edema noted. Foley in place.  Musculoskeletal: He  exhibits edema (LLE with 1+ edema tibially and 2+ pedally. RLE with 1+ edema pedally. ). He exhibits no tenderness.  Right knee with 1-2+ effusion. No tenderness with ROM. Tends to keep RLE rotated outward. Skin: Grade 4 sacral decubitus with good granulation tissue no evidence of necrotic tissue. No drainage except for some serosanguineous on gauze.  Neurological: He is alert and oriented to person, place, and time.  Able to phonate well with PMSV. Speech clear. Follows basic commands without difficulty.  Skin: Skin is warm and dry. He is not diaphoretic.  Right lower shin with stasis changes. Multiple ulcers on bilateral heels,the right heel blister is partially hemorrhagic, left heel has a small eschar right foot, left thigh covered with foam and opsite. Psychiatric: He has a normal mood and affect. His speech is normal and behavior is normal. Cognition and memory are normal.  Motor strength is 3 minus bilateral deltoid 4 minus bicep tricep grip, 2 minus bilateral hip flexors 3 minus knee extensors 3 minus bilateral ankle dorsiflexor plantar flexor Sensation is absent in bilateral feet to light touch. Present at the knees Fingers has some difficulty identifying light touch    Assessment/Plan: 1. 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 Physiatrist is providing close team supervision and 24 hour management of active medical problems listed below. Physiatrist and rehab team continue to assess barriers to discharge/monitor patient progress toward functional and medical goals. Team conference today please see physician documentation under team conference tab, met with team face-to-face to discuss problems,progress, and goals. Formulized individual treatment plan based on medical history, underlying problem and  comorbidities. FIM: Function - Bathing Position: Sitting EOB Body parts bathed by patient: Right arm, Left arm, Chest, Abdomen, Right upper leg, Left upper leg, Right lower leg, Left lower leg Body parts bathed by helper: Back, Buttocks, Front perineal area Bathing not applicable: Front perineal area, Buttocks (RN just cleaned and changed dressing on buttocks) Assist Level: 2 helpers  Function- Upper Body Dressing/Undressing What is the patient wearing?: Pull over shirt/dress Pull over shirt/dress - Perfomed by patient: Thread/unthread right sleeve, Thread/unthread left sleeve Pull over shirt/dress - Perfomed by helper: Put head through opening, Pull shirt over trunk Assist Level: 2 helpers Function - Lower Body Dressing/Undressing What is the patient wearing?: Pants, Non-skid slipper socks Position: Bed Pants- Performed by helper: Thread/unthread right pants leg, Thread/unthread left pants leg, Pull pants up/down Non-skid slipper socks- Performed by helper: Don/doff right sock, Don/doff  left sock Assist for footwear: Dependant Assist for lower body dressing: 2 Helpers  Function - Toileting Toileting activity did not occur: Safety/medical concerns (ileostomy/foley) Assist level: Two helpers (per Alba, NT report)  Function - Air cabin crew transfer activity did not occur: Safety/medical concerns (ileostomy/foley)  Function - Chair/bed transfer Chair/bed transfer activity did not occur: Safety/medical concerns Chair/bed transfer method: Lateral scoot Chair/bed transfer assist level: 2 helpers Chair/bed transfer assistive device: Sliding board  Function - Locomotion: Wheelchair Will patient use wheelchair at discharge?: Yes Type: Manual Wheelchair activity did not occur: Safety/medical concerns Max wheelchair distance: 40 Assist Level: Maximal assistance (Pt 25 - 49%) Wheel 50 feet with 2 turns activity did not occur: Safety/medical concerns Wheel 150 feet activity  did not occur: Safety/medical concerns Function - Locomotion: Ambulation Ambulation activity did not occur: Safety/medical concerns Walk 10 feet activity did not occur: Safety/medical concerns Walk 50 feet with 2 turns activity did not occur: Safety/medical concerns Walk 150 feet activity did not occur: Safety/medical concerns Walk 10 feet on uneven surfaces activity did not occur: Safety/medical concerns  Function - Comprehension Comprehension: Auditory Comprehension assist level: Understands basic 75 - 89% of the time/ requires cueing 10 - 24% of the time  Function - Expression Expression: Verbal Expression assistive device: Talk trach valve Expression assist level: Expresses basic 90% of the time/requires cueing < 10% of the time.  Function - Social Interaction Social Interaction assist level: Interacts appropriately 75 - 89% of the time - Needs redirection for appropriate language or to initiate interaction.  Function - Problem Solving Problem solving assist level: Solves basic 50 - 74% of the time/requires cueing 25 - 49% of the time  Function - Memory Memory assist level: Recognizes or recalls 50 - 74% of the time/requires cueing 25 - 49% of the time Patient normally able to recall (first 3 days only): Current season, Location of own room, Staff names and faces, That he or she is in a hospital  1. 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 LLE DVT treatment /Anticoagulation: Pharmaceutical: Coumadin 3. Pain Management: Tylenol for prn for pain. Will continue oxycodone prn if needed for severe pain. 4. Mood: LCSW to follow for evaluation and support.  5. Neuropsych: This patient is capable of making decisions on his own behalf. 6. Skin/Wound Care:  appreciate.general surgery  for sacral wound debridement, PRAFO ordered for heels, Cont air bed , Santyl collagenase for sacral decubitus. Hydrotherapy for physical therapy 7. Fluids/Electrolytes/Nutrition: Monitor I/O. Will consult dietician to help with food choices. Increase prosource to TID. Added ensure between meals as unable to tolerate a lot of food at one time.  8. Abdominal abscess: Continue drain care bid. On Daptomycin --question duration. Flagyl to contiue till 10/26 per records. ID on consult, Pt refused contrast has some improvement of at least one abscess, s/p drain removal 9. Hypokalemia: Improved. 4.0 on 10/17.  10. VDRF/ History of OSA: To continue current trach till mobility improves per CCM. Tolerating humidified air. We will need to reconsult pulmonary before decannulation, will wait until strength improves 11. Anxiety disorder: Will resume low dose Xanax as used this for insomnia at home. given his respiratory compromise will need to limit dosing.  Improving.  12. Anemia: Likely due to infection/chronic illness. Hgb stable at 8.2 on 10/14.  Will cont to monitor 13. Anasarca: Improved Continue  Lasix.  14. Diastolic CHF: Off amiodarone, Ace and statin at this time due to recurrent hypotension and multiple medical issues. Monitor for signs of overload. Check daily weights. Monitor BP bid.  15.  Hypomagnesemia-labs personally reviewed. increase supplement to 863m BID, repeat Mg level on 10/17 still low cont current dose with recheck next week LOS (Days) 7 A FACE TO FACE EVALUATION WAS PERFORMED   Diamantina Edinger ALorie Phenix10/20/2016, 2:04 PM

## 2015-09-03 NOTE — Progress Notes (Signed)
Patient complains of wet feeling on bottom.  Checked dressing- saturated with serous fluid down to his underwear and pad.  Changed dressing per MD order.  Cleaned patient, assisted patient back on left side.  Dani Gobbleeardon, Lestat Golob J, RN

## 2015-09-03 NOTE — Progress Notes (Signed)
Occupational Therapy Weekly Progress Note  Patient Details  Name: Jonathan Le MRN: [id number] Date of Birth: 10/11/1946  Beginning of progress report period: August 28, 2015 End of progress report period: September 03, 2015  Today's Date: 09/03/2015 OT Individual Time: 0802-0901 OT Individual Time Calculation (min): 59 min    Patient has met 1 of 4 short term goals.  Jonathan Le continues to make slow progress with OT sessions at this time secondary to multiple medical issues and pain in his right knee, sacrum, and lower back in sitting positions and with bed mobility.  He was able to tolerate being up in the wheelchair for 1hr 20 mins yesterday but that is the only significant time he has been able to tolerate this.  Pain level does continue to improve and pt does tolerate sitting EOB for greater than 30 mins during bathing tasks.  He continues to need use of AE for LB bathing as well as return to supine position as he cannot tolerate or perform standing at this time.  Recommend continued OT treatment with anticipated LOS 4 weeks overall.  Feel pt will likely need mod assist or greater at that time secondary to slow progress.  Family will need extensive training to assist him if he discharges home.  Feel SNF level may be needed if progress remains slow.    Patient continues to demonstrate the following deficits: decreased balance, decreased activity tolerance, decreased UE and LE strength bilaterally, increased pain  and therefore will continue to benefit from skilled OT intervention to enhance overall performance with iADL.  Patient progressing toward long term goals..  Continue plan of care.  OT Short Term Goals Week 2:  OT Short Term Goal 1 (Week 2): Pt will complete LB dressing at bed level with max assist OT Short Term Goal 2 (Week 2): Pt will donn pullover shirt EOB with close supervision. OT Short Term Goal 3 (Week 2): Pt will perform UB bathing sitting unsupported EOB with  supervision, including reaching both upper shoulders.  OT Short Term Goal 4 (Week 2): Pt will transition sidelying to sitting in order to complete seltcare tasks with mod assist 2 consecutive sessions OT Short Term Goal 5 (Week 2): Pt will perform sliding board transfer from bed to wheelchair with max assist.  Skilled Therapeutic Interventions/Progress Updates:    Pt worked on bathing and dressing during session sit to supine.  Use of the LH sponge in sitting EOB for washing lower legs.  Pt needing max assist with use of the reacher to removed his gripper socks.  May benefit from sockaide trial but has dressings on each foot which could be an issue with using this.  He was able to sit EOB for 35 mins with close supervision during bathing task.  While washing, discussed anticipated progress over the next few weeks as well as expectations for further rehab at SNF if family cannot provide extensive 24 hour physical assist.  Finished bathing in supine with pt using the bed rails to roll so therapist could assist with peri hygiene.  He needed assist with bending up the LEs and then he was able to roll to the right with min assist but needed mod assist to roll to the left.  Pt completed washing buttocks while pt positioned himself on his side.  Did not donn pants as pt was having hydro therapy shortly after session.  Pt left in bed with call button and phone within reach.  Oxygen sats remained greater than  95% on 5Ls trach collar when monitored during session.   Therapy Documentation Precautions:  Precautions Precautions: Fall Precaution Comments: continuous pulse ox, trach, O2, sacral decub with increased pain Restrictions Weight Bearing Restrictions: No Other Position/Activity Restrictions: trach collar  Vital Signs: Therapy Vitals Temp: 98 F (36.7 C) Temp Source: Oral Pulse Rate: 96 Resp: 18 BP: 103/65 mmHg Patient Position (if appropriate): Lying Oxygen Therapy SpO2: 94 % O2 Device:  Tracheostomy Collar O2 Flow Rate (L/min): 5 L/min FiO2 (%): 28 % Pain: Pain Assessment Pain Assessment: Faces Pain Score: 0-No pain Faces Pain Scale: Hurts a little bit Pain Type: Acute pain Pain Location: Sacrum Pain Descriptors / Indicators: Burning Pain Intervention(s): Repositioned;Ambulation/increased activity;Emotional support ADL: See Function Navigator for Current Functional Status.   Therapy/Group: Individual Therapy  Jonathan Le OTR/L 09/03/2015, 9:25 AM

## 2015-09-03 NOTE — Progress Notes (Signed)
Speech Language Pathology Daily Session Note  Patient Details  Name: Jonathan Le MRN: [id number] Date of Birth: 01/26/1946  Today's Date: 09/03/2015 SLP Individual Time: [id number] SLP Individual Time Calculation (min): 55 min  Short Term Goals: Week 1: SLP Short Term Goal 1 (Week 1): Pt will return demonstration of how to donn/doff speaking valve over 3 targeted sessions with supervision.  SLP Short Term Goal 2 (Week 1): Pt will recall steps of how to clean speaking valve over 3 targeted sessions with supervision.  SLP Short Term Goal 3 (Week 1): Pt will utliize compensatory strategies to facilitate recall of daily information with min assist verbal cues.   SLP Short Term Goal 4 (Week 1): Pt will complete basic to semi-complex self care and/or home management tasks with min assist verbal cues for functional problem solving  SLP Short Term Goal 5 (Week 1): Pt will recognize and correct errors in the moment during functional tasks with min assist verbal cues.   Skilled Therapeutic Interventions:  Pt was seen for skilled ST targeting cognitive goals.  SLP facilitated the session with a basic card game targeting functional problem solving.  Pt planned and executed a problem solving strategy with min assist faded to supervision cues.  He recalled at least 2 details from a previous PT therapy session during conversation with SLP with supervision cues.  He remained pleasantly interactive and engaged in all therapeutic activities.  Pt was left in bed with family at bedside and all needs were left within reach.   Continue per current plan of care.    Function:  Eating Eating                 Cognition Comprehension Comprehension assist level: Understands basic 75 - 89% of the time/ requires cueing 10 - 24% of the time  Expression Expression assistive device: Talk trach valve Expression assist level: Expresses basic 90% of the time/requires cueing < 10% of the time.  Social Interaction  Social Interaction assist level: Interacts appropriately 75 - 89% of the time - Needs redirection for appropriate language or to initiate interaction.  Problem Solving Problem solving assist level: Solves basic 75 - 89% of the time/requires cueing 10 - 24% of the time  Memory Memory assist level: Recognizes or recalls 50 - 74% of the time/requires cueing 25 - 49% of the time    Pain Pain Assessment Pain Assessment: No/denies pain  Therapy/Group: Individual Therapy  Jonathan Le, Jonathan Le 09/03/2015, 4:18 PM

## 2015-09-03 NOTE — Progress Notes (Signed)
Nutrition Follow-up  DOCUMENTATION CODES:   Obesity unspecified  INTERVENTION:   Provide Ensure Enlive po once daily, each supplement provides 350 kcal and 20 grams of protein.  Continue 30 ml Prostat po TID, each supplement provides 100 kcal and 15 grams of protein.   Encourage adequate PO intake.   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 He required total colectomy with ileostomy Abdominal incision closing in well and multiple ulcers--bilateral heels, left thigh, and trach sites clean and dry. Stage III Sacrococcygeal wound -debrided at bedside on 10/05 and currently reported to be 15 X 15 with 1.8 cm deep .  Meal completion has been 80-100%. Intake has been improving. Pt currently has Ensure and Prostat ordered and has been consuming them. Since patient intake has been improving, RD to decrease nutritional supplements as intake at meals has been adequate. RD to continue to monitor.   Diet Order:  Diet regular Room service appropriate?: Yes; Fluid consistency:: Thin; Fluid restriction:: 1500 mL Fluid  Skin:  Wound (see comment) (Unstageable ulcer on L heel, R coccyx, stage III on buttocks)  Last BM:  10/20 ileostomy  Height:   Ht Readings from Last 1 Encounters:  08/27/15 5' 9" (1.753 m)    Weight:   Wt Readings from Last 1 Encounters:  09/03/15 251 lb 6.4 oz (114.034 kg)    Ideal Body Weight:  72.7 kg  BMI:  Body  mass index is 37.11 kg/(m^2).  Estimated Nutritional Needs:   Kcal:  2200-2400  Protein:  135-150 grams  Fluid:  2.2 - 2.4 L/day  EDUCATION NEEDS:   No education needs identified at this time  Corrin Parker, MS, RD, LDN Pager # [phone] After hours/ weekend pager # [phone]

## 2015-09-03 NOTE — Progress Notes (Signed)
[facility]    Subjective: No new complaints   Antibiotics:  Anti-infectives    Start     Dose/Rate Route Frequency Ordered Stop   09/01/15 1800  Ampicillin-Sulbactam (UNASYN) 3 g in sodium chloride 0.9 % 100 mL IVPB     3 g 100 mL/hr over 60 Minutes Intravenous Every 6 hours 09/01/15 1539     08/27/15 2200  meropenem (MERREM) 1 g in sodium chloride 0.9 % 100 mL IVPB  Status:  Discontinued     1 g 200 mL/hr over 30 Minutes Intravenous 3 times per day 08/27/15 1658 09/01/15 1436   08/27/15 1615  metroNIDAZOLE (FLAGYL) tablet 500 mg  Status:  Discontinued     500 mg Oral 3 times per day 08/27/15 1610 08/28/15 1651      Medications: Scheduled Meds: . ampicillin-sulbactam (UNASYN) IV  3 g Intravenous Q6H  . antiseptic oral rinse  15 mL Mouth Rinse TID PC  . collagenase   Topical BID  . diclofenac sodium  2 g Topical QID  . famotidine  20 mg Oral BID  . [START ON 09/04/2015] feeding supplement (ENSURE ENLIVE)  237 mL Oral Q24H  . feeding supplement (PRO-STAT SUGAR FREE 64)  30 mL Oral TID  . furosemide  60 mg Oral Daily  . hydrocerin   Topical BID  . insulin aspart  0-5 Units Subcutaneous QHS  . insulin aspart  0-9 Units Subcutaneous TID WC  . magnesium oxide  800 mg Oral BID  . multivitamin with minerals  1 tablet Oral Daily  . potassium chloride  20 mEq Oral TID  . vitamin C  500 mg Oral BID  . Warfarin - Pharmacist Dosing Inpatient   Does not apply q1800   Continuous Infusions:  PRN Meds:.alum & mag hydroxide-simeth, bisacodyl, diphenhydrAMINE, guaiFENesin-dextromethorphan, ipratropium-albuterol, ondansetron **OR** ondansetron (ZOFRAN) IV, sodium chloride, sodium phosphate, traMADol    Objective: Weight change: 2 lb 4.5 oz (1.034 kg)  Intake/Output Summary (Last 24 hours) at 09/03/15 1856 Last data filed at 09/03/15 1854  Gross per 24 hour  Intake    510 ml  Output   2825 ml  Net  -2315 ml   Blood pressure 104/64,  pulse 99, temperature 97.8 F (36.6 C), temperature source Oral, resp. rate 18, height  (1.753 m), weight 251 lb 6.4 oz (114.034 kg), SpO2 97 %. Temp:  [97.8 F (36.6 C)-98 F (36.7 C)] 97.8 F (36.6 C) (10/20 1401) Pulse Rate:  [95-107] 99 (10/20 1532) Resp:  [18-20] 18 (10/20 1532) BP: (103-104)/(64-65) 104/64 mmHg (10/20 1401) SpO2:  [93 %-97 %] 97 % (10/20 1532) FiO2 (%):  [28 %] 28 % (10/20 1532) Weight:  [251 lb 6.4 oz (114.034 kg)] 251 lb 6.4 oz (114.034 kg) (10/20 0610)  Physical Exam: General: Alert and awake, oriented x3, not in any acute distress. HEENT: anicteric sclera, EOMI CVS regular rate, normal r,  no murmur rubs or gallops Chest: clear to auscultation bilaterally, no wheezing, rales or rhonchi Abdomen: soft nontender, nondistended, normal bowel sounds, several JP drains in place Extremities: no  clubbing or edema noted bilaterally Skin: venous stasis changes in bilateral lower extremities Decubitus ulcers:  09/03/15:      Neuro: global weakness  CBC: CBC Latest Ref Rng 08/28/2015 08/20/2015 08/16/2015  WBC 4.0 - 10.5 K/uL 6.6 5.2 9.2  Hemoglobin 13.0 - 17.0 g/dL 8.2(L) 8.8(L) 7.5(L)  Hematocrit 39.0 - 52.0 % 26.9(L)  29.3(L) 25.6(L)  Platelets 150 - 400 K/uL 349 328 387       BMET No results for input(s): NA, K, CL, CO2, GLUCOSE, BUN, CREATININE, CALCIUM in the last 72 hours.   Liver Panel  No results for input(s): PROT, ALBUMIN, AST, ALT, ALKPHOS, BILITOT, BILIDIR, IBILI in the last 72 hours.     Sedimentation Rate No results for input(s): ESRSEDRATE in the last 72 hours. C-Reactive Protein No results for input(s): CRP in the last 72 hours.  Micro Results: Recent Results (from the past 720 hour(s))  Culture, blood (routine x 2)     Status: None   Collection Time: 08/05/15  1:47 PM  Result Value Ref Range Status   Specimen Description BLOOD PICC LINE  Final   Special Requests BOTTLES DRAWN AEROBIC AND ANAEROBIC 10CC  Final    Culture NO GROWTH 5 DAYS  Final   Report Status 08/10/2015 FINAL  Final  Culture, blood (routine x 2)     Status: None   Collection Time: 08/05/15  1:55 PM  Result Value Ref Range Status   Specimen Description BLOOD LEFT HAND  Final   Special Requests BOTTLES DRAWN AEROBIC ONLY 10CC  Final   Culture NO GROWTH 5 DAYS  Final   Report Status 08/10/2015 FINAL  Final  C difficile quick scan w PCR reflex     Status: None   Collection Time: 08/05/15  2:04 PM  Result Value Ref Range Status   C Diff antigen NEGATIVE NEGATIVE Final   C Diff toxin NEGATIVE NEGATIVE Final   C Diff interpretation Negative for toxigenic C. difficile  Final  Stat Gram stain     Status: None   Collection Time: 08/05/15  4:30 PM  Result Value Ref Range Status   Specimen Description SPUTUM  Final   Special Requests NONE  Final   Gram Stain   Final    RARE WBC PRESENT, PREDOMINANTLY MONONUCLEAR RARE GRAM POSITIVE COCCI IN PAIRS Gram Stain Report Called to,Read Back By and Verified WithArminda Resides RN 1815 08/05/15 A BROWNING    Report Status 08/05/2015 FINAL  Final  Culture, respiratory (NON-Expectorated)     Status: None   Collection Time: 08/05/15  4:30 PM  Result Value Ref Range Status   Specimen Description TRACHEAL ASPIRATE  Final   Special Requests NONE  Final   Gram Stain   Final    MODERATE WBC PRESENT,BOTH PMN AND MONONUCLEAR RARE SQUAMOUS EPITHELIAL CELLS PRESENT RARE GRAM POSITIVE COCCI IN PAIRS Performed at [facility]    Culture   Final    Non-Pathogenic Oropharyngeal-type Flora Isolated. Performed at [facility]    Report Status 08/08/2015 FINAL  Final  Culture, Urine     Status: None   Collection Time: 08/05/15  6:21 PM  Result Value Ref Range Status   Specimen Description URINE, RANDOM  Final   Special Requests NONE  Final   Culture NO GROWTH 2 DAYS  Final   Report Status 08/07/2015 FINAL  Final  Stat Gram stain     Status: None   Collection Time: 08/05/15  6:21 PM    Result Value Ref Range Status   Specimen Description URINE, RANDOM  Final   Special Requests NONE  Final   Gram Stain   Final    WBC PRESENT, PREDOMINANTLY MONONUCLEAR NO ORGANISMS SEEN CYTOSPIN    Report Status 08/05/2015 FINAL  Final    Studies/Results: No results found.    Assessment/Plan:  INTERVAL HISTORY:  08/31/15: pt getting ready for repeat CT scan, CCS satisfied with progress with sacral wound 09/01/15: scan showed improvement and one drain (right) removed 09/02/15: Pt tolerating unasyn without problems  Principal Problem:   Critical illness myopathy Active Problems:   Debility   Intra-abdominal abscess (HCC)   Critical illness neuropathy (HCC)   Enterococcal infection   Sacral decubitus ulcer   Hypoalbuminemia due to protein-calorie malnutrition (HCC)   Abscess of abdominal cavity (HCC)    Jonathan Le is a 69 y.o. male with  complicated recent hospital course including intra-abdominal abscesses status post drainage of a care myopathy decubitus ulcers  #1 Intra-abdominal abscesses: CT looks improved. One drain DC'd, narrowed to unasyn.   can ultimately be changed to oral augmentin as he nears DC from rehab or sooner if he tolerates IV unasyn and giong to PO will liberate him in rehab from IV   He will need protracted course of abx until all of his abscesses are completely resolved (perhaps another month)  #2 Decubitus ulcer: seems to be stable. Greatly appreciate PT, WOC, CCS  I will arrange HSFU in the next month with us  I would like him to remain on oral augmentin until re-evaluated by us in clinic  Please call with further questions.    LOS: 7 days   Jonathan Le 09/03/2015, 6:56 PM

## 2015-09-03 NOTE — Progress Notes (Addendum)
Physical Therapy Session Note  Patient Details  Name: Jonathan Le MRN: [id number] Date of Birth: 04/08/1946  Today's Date: 09/03/2015 PT Individual Time: 1105-1220 PT Individual Time Calculation (min): 75 min   Short Term Goals: Week 1:  PT Short Term Goal 1 (Week 1): Will perform supine <>sit with maxA +1 and bedrails PT Short Term Goal 2 (Week 1): Will perform sitting balance on EOB x10 min with no UE support while performing functional task PT Short Term Goal 3 (Week 1): Will perform sit <>stand with maxA +2 PT Short Term Goal 4 (Week 1): Will perform w/c propulsion x100' with modA     Skilled Therapeutic Interventions/Progress Updates:  Bed mobility with HOB raised and use of bed rails; sitting EOB x 15 minutes.  No c/o dizziness.  PT donned socks and regular shoes with pt sitting EOB.  +2 Sb transfer bed> w/c to L.  Higher cushion used in w/c which pt stated was more comfortable; PT will continue to search for more pressure- relieving cushion due to wounds/pain buttocks.  Pt locked/unlocked w/c brakes with VCs.  Therapeutic exercise performed with bil UEs to increase strength for functional mobility: using 1# weighted bar, tapped beach ball x 10 x 2. Neuro re-ed for gluteal activation via multimodal cues for bil glut sets to perform when up in w/c. Pt stated buttocks were uncomfortable in sitting; he chooses to lean forward which relieves pressure slightly; pillow placed behind back.  Pt requested towel rolls under each buttock to relieve pressure over wounds.  PT placed 2 rolled washcloths under each thigh/buttocks; pt stated he would know if this was helpful in a few minutes.   NT arrived to set pt up for lunch.  PT informed her that pt may need [facility].    Therapy Documentation Precautions:  Precautions Precautions: Fall Precaution Comments: continuous pulse ox, trach, O2, sacral decub with increased pain Restrictions Weight  Bearing Restrictions: No Other Position/Activity Restrictions: trach collar   Vital Signs: Therapy Vitals Pulse Rate: (!) 104 Oxygen Therapy SpO2: 97 % O2 Device: Tracheostomy Collar O2 Flow Rate (L/min): 6 L/min FiO2 (%): 28 % Pain: Pain Assessment Pain Assessment: Faces Pain rated 6/10 Pain Location: Buttocks Pain Descriptors / Indicators: Burning Pain Intervention(s): Repositioned    See Function Navigator for Current Functional Status.   Therapy/Group: Individual Therapy  Blaire Hodsdon 09/03/2015, 12:37 PM

## 2015-09-04 ENCOUNTER — Inpatient Hospital Stay (HOSPITAL_COMMUNITY): Payer: Medicare Other | Admitting: Physical Therapy

## 2015-09-04 ENCOUNTER — Inpatient Hospital Stay (HOSPITAL_COMMUNITY): Payer: Medicare Other | Admitting: Speech Pathology

## 2015-09-04 ENCOUNTER — Inpatient Hospital Stay (HOSPITAL_COMMUNITY): Payer: Medicare Other | Admitting: Occupational Therapy

## 2015-09-04 LAB — GLUCOSE, CAPILLARY
Glucose-Capillary: 92 mg/dL (ref 65–99)
Glucose-Capillary: 109 mg/dL — ABNORMAL HIGH (ref 65–99)
Glucose-Capillary: 118 mg/dL — ABNORMAL HIGH (ref 65–99)
Glucose-Capillary: 121 mg/dL — ABNORMAL HIGH (ref 65–99)

## 2015-09-04 LAB — PROTIME-INR
INR: 2.23 — AB (ref 0.00–1.49)
Prothrombin Time: 24.5 seconds — ABNORMAL HIGH (ref 11.6–15.2)

## 2015-09-04 MED ORDER — WARFARIN SODIUM 7.5 MG PO TABS
7.5000 mg | ORAL_TABLET | Freq: Once | ORAL | Status: AC
  Administered 2015-09-04: 7.5 mg via ORAL
  Filled 2015-09-04: qty 1

## 2015-09-04 NOTE — Progress Notes (Signed)
Physical Therapy Wound Treatment Patient Details  Name: Jonathan Le MRN: [id number] Date of Birth: 23-Jul-1946  Today's Date: 09/04/2015 Time: 9833-8250 Time Calculation (min): 42 min  Subjective  Subjective: It gets sore when I move a lot. Patient and Family Stated Goals: Get better Date of Onset:  (prior to this hospitalization) Prior Treatments: I&D, dressing changes  Pain Score: 0/10 - Increases when moving however.  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 Subcutaneous fat may be visible but bone, tendon or muscle are NOT exposed. (Active)  Dressing Type ABD;Barrier Film (skin prep);Gauze (Comment);Moist to dry;Tape dressing;Other (Comment) 09/04/2015 10:19 AM  Dressing Changed;Clean;Dry;Intact 09/04/2015 10:19 AM  Dressing Change Frequency Twice a day 09/04/2015 10:19 AM  State of Healing Early/partial granulation 09/04/2015 10:19 AM  Site / Wound Assessment Pink;Red;Yellow 09/04/2015 10:19 AM  % Wound base Red or Granulating 45% 09/04/2015 10:19 AM  % Wound base Yellow 55% 09/04/2015 10:19 AM  % Wound base Black 0% 09/04/2015 10:19 AM  % Wound base Other (Comment) 0% 09/04/2015 10:19 AM  Peri-wound Assessment Intact 09/04/2015 10:19 AM  Wound Length (cm) 8.3 cm 09/01/2015  8:48 AM  Wound Width (cm) 9 cm 09/01/2015  8:48 AM  Wound Depth (cm) 5.5 cm 09/01/2015  8:48 AM  Tunneling (cm) 4 08/27/2015  5:00 PM  Margins Unattached edges (unapproximated) 09/04/2015 10:19 AM  Drainage Amount Copious 09/04/2015 10:19 AM  Drainage Description Serosanguineous;No odor 09/04/2015 10:19 AM  Treatment Debridement (Selective);Hydrotherapy (Pulse lavage);Off loading;Packing (Saline gauze);Tape changed;Other (Comment) 09/04/2015 10:19 AM        Santyl applied to wound bed prior to applying dressing.  Hydrotherapy Pulsed lavage therapy - wound location: sacrum/buttocks Pulsed Lavage with Suction (psi): 8 psi (4-8) Pulsed Lavage with  Suction - Normal Saline Used: 1000 mL Pulsed Lavage Tip: Tip with splash shield Selective Debridement Selective Debridement - Location: sacrum/buttocks Selective Debridement - Tools Used: Forceps;Scissors Selective Debridement - Tissue Removed: yellow necrotic tissue   Wound Assessment and Plan  Wound Therapy - Assess/Plan/Recommendations Wound Therapy - Clinical Statement: Good granulation around perimeter. More necrotic tissue removed from cavity of wound. Now using Kerlix for packing rather than multiple 4x4 gauze Wound Therapy - Functional Problem List: Pt with decr sitting tolerance due to sacral wound. Factors Delaying/Impairing Wound Healing: Immobility;Multiple medical problems;Polypharmacy Hydrotherapy Plan: Debridement;Dressing change;Patient/family education;Pulsatile lavage with suction Wound Therapy - Frequency: 6X / week Wound Therapy - Follow Up Recommendations: Home health RN Wound Plan: See above  Wound Therapy Goals- Improve the function of patient's integumentary system by progressing the wound(s) through the phases of wound healing (inflammation - proliferation - remodeling) by: Decrease Necrotic Tissue to: 10 Decrease Necrotic Tissue - Progress: Progressing toward goal Increase Granulation Tissue to: 90 Increase Granulation Tissue - Progress: Progressing toward goal  Goals will be updated until maximal potential achieved or discharge criteria met.  Discharge criteria: when goals achieved, discharge from hospital, MD decision/surgical intervention, no progress towards goals, refusal/missing three consecutive treatments without notification or medical reason.  GP     [name] 09/04/2015, 10:25 AM  Elayne Snare, [place]

## 2015-09-04 NOTE — Progress Notes (Signed)
Referring Physician(s): Jonathan Le  Chief Complaint:  Colectomy; colostomy; abd abscesses  Subjective:  B abs abscess drains placed 9/6 2 new drains on L placed 9/29 R removed 10/18 Still with 3 drains on L Output diminishing--still approx 30 cc daily Pt afeb; feeling better daily  Allergies: Levaquin; Penicillins; and Doxycycline  Medications: Prior to Admission medications   Medication Sig Start Date End Date Taking? Authorizing Provider  Amino Acids-Protein Hydrolys (FEEDING SUPPLEMENT, PRO-STAT SUGAR FREE 64,) LIQD Place 30 mLs into feeding tube 5 (five) times daily. 07/25/15  Yes Jonathan CrazeBrandi L Ollis, NP  white petrolatum (VASELINE) GEL Apply 1 application topically daily. Apply to L nare wound daily 07/08/15  Yes Jonathan CrazeBrandi L Ollis, NP     Vital Signs: BP 103/59 mmHg  Pulse 100  Temp(Src) 98.3 F (36.8 C) (Oral)  Resp 20  Ht 5\' 9"  (1.753 m)  Wt 253 lb 1.6 oz (114.805 kg)  BMI 37.36 kg/m2  SpO2 97%  Physical Exam  Abdominal: Soft. Bowel sounds are normal.  L abd drains intact Sites clean and dry NT no bleeding Output all drains: yellowish/cloudy approx 30 cc /day + enterococcus afeb   Skin: Skin is warm and dry.    Imaging: Ct Abdomen Pelvis Wo Contrast  08/31/2015  CLINICAL DATA:  Intra-abdominal abscess, sacral decubitus, hypertension, CHF, prior CABG, morbid obesity EXAM: CT ABDOMEN AND PELVIS WITHOUT CONTRAST TECHNIQUE: Multidetector CT imaging of the abdomen and pelvis was performed following the standard protocol without IV contrast. Sagittal and coronal MPR images reconstructed from axial data set. Patient refused IV contrast. Small amount of oral contrast is present on the exam. COMPARISON:  08/21/2015 FINDINGS: LEFT pleural effusion. BILATERAL lower lobe atelectasis, cannot exclude infiltrate in LEFT lower lobe. Liver, spleen, pancreas, and adrenal glands unremarkable for technique. BILATERAL nonobstructing renal calculi. Pigtail drainage catheters lateral  RIGHT mid abdomen and lateral LEFT mid abdomen and lateral upper LEFT pelvis. Decrease in sizes of abscess collections seen previously. RIGHT lateral abdominal abscess measures 5.7 x 2.0 cm image 45 previously 8.0 x 2.5 cm. Minimal residual fluid surrounding the cranial most drainage catheter, 4.0 x 1.5 cm image 38. Minimal residual fluid at 2 remaining abscesses in the lateral LEFT abdomen/ pelvis. Persistent thickening the lobe soft tissues out the lateral LEFT abdominal wall just above the iliac crest. Significant fluid within rectal stump. Catheter decompresses urinary bladder with note of significant prostatic enlargement. Visualized bowel loops appear normal caliber without dilatation or evidence of obstruction. RIGHT lower quadrant ostomy again noted. Scattered atherosclerotic calcifications. No new abscess collections, free air or free fluid identified. LEFT inguinal hernia containing fat. Presacral abscess with gas extending to near bone without definite bone destruction. 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 No additional focal osseous abnormalities. IMPRESSION: Decreased sizes of abscess collections seen on previous exam post percutaneous drainage. Small LEFT pleural effusion with bibasilar atelectasis, little changed. Prostatic enlargement. LEFT inguinal hernia. BILATERAL nonobstructing renal calculi. No definite new intra-abdominal or intrapelvic abnormalities. Electronically Signed   By: Jonathan SouthwardMark  Le M.D.   On: 08/31/2015 19:14    Labs:  CBC:  Recent Labs  08/07/15 0700 08/16/15 0701 08/20/15 0648 08/28/15 0430  WBC 8.7 9.2 5.2 6.6  HGB 7.4* 7.5* 8.8* 8.2*  HCT 24.0* 25.6* 29.3* 26.9*  PLT 418* 387 328 349    COAGS:  Recent Labs  07/21/15 0815  07/23/15 1200 07/25/15 0419 07/30/15 0627  09/01/15 0425 09/02/15 0231 09/03/15 0450 09/04/15 0455  INR  1.65*  < > 2.62* 1.48 1.35  < > 1.73* 1.79* 2.02* 2.23*  APTT 29  --  38* 28  31  --   --   --   --   --   < > = values in this interval not displayed.  BMP:  Recent Labs  08/27/15 0920 08/28/15 0430 08/29/15 0502 08/31/15 0445  NA 141 139 135 135  K 3.2* 3.3* 3.6 4.0  CL 103 99* 97* 98*  CO2 32 33* 31 32  GLUCOSE 128* 100* 99 94  BUN CALCIUM 8.1* 8.1* 8.0* 8.1*  CREATININE 0.33* 0.32* 0.35* 0.33*  GFRNONAA >60 >60 >60 >60  GFRAA >60 >60 >60 >60    LIVER FUNCTION TESTS:  Recent Labs  07/23/15 0600 07/24/15 0345 07/25/15 0419  07/28/15 0630 08/02/15 0620 08/16/15 0701 08/28/15 0430  BILITOT 1.0 0.7 0.9  --   --   --   --  0.6  AST 30 40 35  --   --   --   --  22  ALT 15* 15* 14*  --   --   --   --  10*  ALKPHOS 154* 167* 199*  --   --   --   --  80  PROT 4.6* 4.3* 4.5*  --   --   --   --  5.2*  ALBUMIN <1.0* <1.0* <1.0*  < > 1.4* 1.2* 1.2* 1.6*  < > = values in this interval not displayed.  Assessment and Plan:  abd abscess drain intact Will follow Need re Ct probably next week  Signed: Zahir Eisenhour Le 09/04/2015, 10:25 AM   I spent Le total of 15 Minutes at the the patient's bedside AND on the patient's hospital floor or unit, greater than 50% of which was counseling/coordinating care for abscess drains

## 2015-09-04 NOTE — Progress Notes (Signed)
Physical Therapy Weekly Progress Note  Patient Details  Name: Jonathan Le MRN: [id number] Date of Birth: Jan 21, 1946  Beginning of progress report period: August 28, 2015 End of progress report period: September 04, 2015    Patient has met 1 of 4 short term goals. Pt has been limited by pain and decreased activity tolerance.  Improved bed mobilty but all OOB activity limited by wound, knee and back pain.  Patient continues to demonstrate the following deficits: pain, weakness, decreased activity toelrance and therefore will continue to benefit from skilled PT intervention to enhance overall performance with activity tolerance, balance, postural control and ability to compensate for deficits.  Patient progressing toward long term goals..  Continue plan of care.  PT Short Term Goals Week 1:  PT Short Term Goal 1 (Week 1): Will perform supine <>sit with maxA +1 and bedrails PT Short Term Goal 1 - Progress (Week 1): Met PT Short Term Goal 2 (Week 1): Will perform sitting balance on EOB x10 min with no UE support while performing functional task PT Short Term Goal 2 - Progress (Week 1): Progressing toward goal PT Short Term Goal 3 (Week 1): Will perform sit <>stand with maxA +2 PT Short Term Goal 3 - Progress (Week 1): Progressing toward goal PT Short Term Goal 4 (Week 1): Will perform w/c propulsion x100' with modA PT Short Term Goal 4 - Progress (Week 1): Progressing toward goal Week 2:  PT Short Term Goal 1 (Week 2): Pt will perform w/c propulsion 50' with mod A PT Short Term Goal 2 (Week 2): Pt will sit EOB x 10 minutes with supervision for functional UE task PT Short Term Goal 3 (Week 2): Pt will transfer bed <> chair consistently with max A of 2  Skilled Therapeutic Interventions/Progress Updates:  Ambulation/gait training;Balance/vestibular training;Discharge planning;Disease management/prevention;DME/adaptive equipment instruction;Functional mobility training;Neuromuscular  re-education;Patient/family education;Pain management;Psychosocial support;Stair training;Skin care/wound management;Splinting/orthotics;Therapeutic Activities;Therapeutic Exercise;UE/LE Coordination activities;UE/LE Strength taining/ROM;Wheelchair propulsion/positioning     See Function Navigator for Current Functional Status.   DONAWERTH,KAREN 09/04/2015, 2:44 PM

## 2015-09-04 NOTE — Progress Notes (Signed)
Occupational Therapy Session Note  Patient Details  Name: Jonathan Le MRN: [id number] Date of Birth: 1946-04-11  Today's Date: 09/04/2015 OT Individual Time:  - 0800-0900  (39 MIN)      Short Term Goals: Week 1:  OT Short Term Goal 1 (Week 1): Pt will complete bathing with mod assist seated at EOB with assist of 1 caregiver OT Short Term Goal 1 - Progress (Week 1): Not met OT Short Term Goal 2 (Week 1): Pt will complete UB dressing with mod assist and min cues for problem solving/organization OT Short Term Goal 2 - Progress (Week 1): Met OT Short Term Goal 3 (Week 1): Pt will complete LB dressing at bed level with max assist OT Short Term Goal 3 - Progress (Week 1): Not met OT Short Term Goal 4 (Week 1): Pt will complete sit > stand from EOB with +2 assist to complete 1 grooming task OT Short Term Goal 4 - Progress (Week 1): Not met Week 2:  OT Short Term Goal 1 (Week 2): Pt will complete LB dressing at bed level with max assist OT Short Term Goal 2 (Week 2): Pt will donn pullover shirt EOB with close supervision. OT Short Term Goal 3 (Week 2): Pt will perform UB bathing sitting unsupported EOB with supervision, including reaching both upper shoulders.  OT Short Term Goal 4 (Week 2): Pt will transition sidelying to sitting in order to complete seltcare tasks with mod assist 2 consecutive sessions OT Short Term Goal 5 (Week 2): Pt will perform sliding board transfer from bed to wheelchair with max assist. Week 3:     Skilled Therapeutic Interventions/Progress Updates:    Pt. Lying in bed upon OT arrival.   Pt worked on bathing and dressing during session supine and sitting.   Use of the LH sponge in sitting EOB for washing lower legs.  Performed rolling with bending LE's and min assist to right and max assist to left.  Donned pants with max assist.  Remained in bed with  call bell,phone within reach.     Therapy Documentation Precautions:  Precautions Precautions:  Fall Precaution Comments: continuous pulse ox, trach, O2, sacral decub with increased pain Restrictions Weight Bearing Restrictions: No Other Position/Activity Restrictions: trach collar      Pain:  Back 5/10           See Function Navigator for Current Functional Status.   Therapy/Group: Individual Therapy  Lisa Roca 09/04/2015, 7:43 AM

## 2015-09-04 NOTE — Progress Notes (Signed)
Speech Language Pathology Weekly Progress and Session Note  Patient Details  Name: Jonathan Le MRN: [id number] Date of Birth: May 06, 1946  Beginning of progress report period: August 28, 2015 End of progress report period: September 04, 2015  Today's Date: 09/04/2015 SLP Individual Time: 9675-9163 SLP Individual Time Calculation (min): 52 min  Short Term Goals: Week 1: SLP Short Term Goal 1 (Week 1): Pt will return demonstration of how to donn/doff speaking valve over 3 targeted sessions with supervision.  SLP Short Term Goal 1 - Progress (Week 1): Met SLP Short Term Goal 2 (Week 1): Pt will recall steps of how to clean speaking valve over 3 targeted sessions with supervision.  SLP Short Term Goal 2 - Progress (Week 1): Met SLP Short Term Goal 3 (Week 1): Pt will utliize compensatory strategies to facilitate recall of daily information with min assist verbal cues.   SLP Short Term Goal 3 - Progress (Week 1): Met SLP Short Term Goal 4 (Week 1): Pt will complete basic to semi-complex self care and/or home management tasks with min assist verbal cues for functional problem solving  SLP Short Term Goal 4 - Progress (Week 1): Met SLP Short Term Goal 5 (Week 1): Pt will recognize and correct errors in the moment during functional tasks with min assist verbal cues.  SLP Short Term Goal 5 - Progress (Week 1): Progressing toward goal    New Short Term Goals: Week 2: SLP Short Term Goal 1 (Week 2): STG=LTG as he is quickly approaching long term goals   Weekly Progress Updates:  Pt made functional gains this reporting period and has met 4 out of 5 short term goals.  Pt is currently min assist for basic to semi-complex cognitive tasks and is quickly approaching supervision long term goals for cognitive tasks.  Pt can currently donn and doff speaking valve with mod I and can recall procedures for cleaning valve with question cues.  D/c speaking valve goals.  Pt would continue to benefit from brief  ST follow up while inpatient in order to maximize functional independence and reduce burden of care prior to discharge.  Pt and family education is ongoing.    Intensity: Minumum of 1-2 x/day, 30 to 90 minutes Frequency: Total of 15 hours over 7 days of combined therapies (1 to 3 days per week ) Duration/Length of Stay: 14-21 days  Treatment/Interventions: Cognitive remediation/compensation;Cueing hierarchy;Patient/family education;Speech/Language facilitation;Internal/external aids;Functional tasks   Daily Session  Skilled Therapeutic Interventions: Pt was seen for skilled ST targeting cognitive goals.  Upon arrival, pt was seated upright in bed, awake, alert, and agreeable to participate in [place].  Pt completed a basic money management task with supervision cues.  He also completed a basic medication management task with min verbal cues for organization and error awareness when placing pills of varying dosages and frequencies into a medication chart.  Pt was able to read and follow semi-complex directions with supervision, and he returned demonstration for use of a calendar as a memory compensatory strategy with min assist verbal cues.  Pt demonstrated how to donn and doff valve x3 during today's therapy session with mod I.  Recommend d/c speaking valve goals.  Given that pt is quickly approaching long term goals for ST, recommend decreasing ST frequency to 3x/week. Pt will likely be ready for discharge from [facility] next week if he continues to make progress as he has demonstrated throughout this week.  Goals updated on this date to reflect current progress and plan  of care.     Function:   Eating Eating                 Cognition Comprehension Comprehension assist level: Understands basic 75 - 89% of the time/ requires cueing 10 - 24% of the time  Expression Expression assistive device: Talk trach valve Expression assist level: Expresses basic 90% of the time/requires cueing < 10% of  the time.  Social Interaction Social Interaction assist level: Interacts appropriately 75 - 89% of the time - Needs redirection for appropriate language or to initiate interaction.  Problem Solving Problem solving assist level: Solves basic 75 - 89% of the time/requires cueing 10 - 24% of the time  Memory Memory assist level: Recognizes or recalls 50 - 74% of the time/requires cueing 25 - 49% of the time   General    Pain Pain Assessment Pain Assessment: No/denies pain  Therapy/Group: Individual Therapy  Daphane Odekirk, Selinda Orion 09/04/2015, 4:37 PM

## 2015-09-04 NOTE — Progress Notes (Signed)
ANTICOAGULATION CONSULT NOTE - Follow Up Consult  Pharmacy Consult for Coumadin Indication: LLE DVT  Allergies  Allergen Reactions  . Levaquin [Levofloxacin] Nausea Only  . Penicillins Hives  . Doxycycline Rash    Patient Measurements: Height: 5\' 9"  (175.3 cm) Weight: 253 lb 1.6 oz (114.805 kg) IBW/kg (Calculated) : 70.7  Vital Signs: Temp: 97.6 F (36.4 C) (10/21 1346) Temp Source: Oral (10/21 1346) BP: 99/51 mmHg (10/21 1346) Pulse Rate: 100 (10/21 1346)  Labs:  Recent Labs  09/02/15 0231 09/03/15 0450 09/04/15 0455  LABPROT 20.7* 22.7* 24.5*  INR 1.79* 2.02* 2.23*    Estimated Creatinine Clearance: 107.3 mL/min (by C-G formula based on Cr of 0.33).   Assessment: 69 YOM continued on coumadin for new LLE DVT (08/05/2015). INR 2.23, remains therapeutic today.  No new CBC since 10/14.  No bleeding reported or noted per chart.   Flagyl which increases coumadin effect was stopped by ID doctor on 10/14, thus the patient has required higher coumadin dosages post flagyl therapy to keep  INR therapeutic.   Goal of Therapy:  INR 2-3 Monitor platelets by anticoagulation protocol: Yes   Plan:  Warfarin 7.5 mg po daily Daily INR  Jonathan Le, RPh Clinical Pharmacist Pager: [phone]  09/04/2015,2:16 PM

## 2015-09-04 NOTE — Progress Notes (Signed)
Subjective/Complaints: Patient seen this morning lying in bed. He states he doesn't sleep well in general in the hospital. He was seen by VIR and ID yesterday.   ROS: Denies CP, SOB, n/v/d.   Objective: Vital Signs: Blood pressure 103/59, pulse 100, temperature 98.3 F (36.8 C), temperature source Oral, resp. rate 20, height 5' 9" (1.753 m), weight 114.805 kg (253 lb 1.6 oz), SpO2 97 %. No results found. Results for orders placed or performed during the hospital encounter of 08/27/15 (from the past 72 hour(s))  Glucose, capillary     Status: Abnormal   Collection Time: 09/01/15 12:40 PM  Result Value Ref Range   Glucose-Capillary 128 (H) 65 - 99 mg/dL  Glucose, capillary     Status: Abnormal   Collection Time: 09/01/15  4:54 PM  Result Value Ref Range   Glucose-Capillary 109 (H) 65 - 99 mg/dL  Glucose, capillary     Status: None   Collection Time: 09/01/15  9:00 PM  Result Value Ref Range   Glucose-Capillary 97 65 - 99 mg/dL  Protime-INR     Status: Abnormal   Collection Time: 09/02/15  2:31 AM  Result Value Ref Range   Prothrombin Time 20.7 (H) 11.6 - 15.2 seconds   INR 1.79 (H) 0.00 - 1.49  Glucose, capillary     Status: None   Collection Time: 09/02/15  7:05 AM  Result Value Ref Range   Glucose-Capillary 94 65 - 99 mg/dL  Glucose, capillary     Status: Abnormal   Collection Time: 09/02/15 12:21 PM  Result Value Ref Range   Glucose-Capillary 118 (H) 65 - 99 mg/dL  Glucose, capillary     Status: Abnormal   Collection Time: 09/02/15  5:19 PM  Result Value Ref Range   Glucose-Capillary 202 (H) 65 - 99 mg/dL  Glucose, capillary     Status: Abnormal   Collection Time: 09/02/15  5:24 PM  Result Value Ref Range   Glucose-Capillary 108 (H) 65 - 99 mg/dL  Glucose, capillary     Status: None   Collection Time: 09/02/15  9:59 PM  Result Value Ref Range   Glucose-Capillary 99 65 - 99 mg/dL  Protime-INR     Status: Abnormal   Collection Time: 09/03/15  4:50 AM  Result Value Ref  Range   Prothrombin Time 22.7 (H) 11.6 - 15.2 seconds   INR 2.02 (H) 0.00 - 1.49  Glucose, capillary     Status: Abnormal   Collection Time: 09/03/15 12:23 PM  Result Value Ref Range   Glucose-Capillary 124 (H) 65 - 99 mg/dL   Comment 1 Notify RN   Glucose, capillary     Status: Abnormal   Collection Time: 09/03/15  4:19 PM  Result Value Ref Range   Glucose-Capillary 108 (H) 65 - 99 mg/dL   Comment 1 Notify RN   Glucose, capillary     Status: Abnormal   Collection Time: 09/03/15  9:33 PM  Result Value Ref Range   Glucose-Capillary 130 (H) 65 - 99 mg/dL  Protime-INR     Status: Abnormal   Collection Time: 09/04/15  4:55 AM  Result Value Ref Range   Prothrombin Time 24.5 (H) 11.6 - 15.2 seconds   INR 2.23 (H) 0.00 - 1.49  Glucose, capillary     Status: None   Collection Time: 09/04/15  7:01 AM  Result Value Ref Range   Glucose-Capillary 92 65 - 99 mg/dL    Nursing note and vitals reviewed. Constitutional: He is oriented to   person, place, and time.he is slower today He appears well-developed and well-nourished. No distress.  HENT:  Head: Normocephalic and atraumatic.  Eyes: Conjunctivae and EOM are normal.   Neck: Decreased range of motion present.  Trach site with dry dressing in place.  Cardiovascular: Normal rate and regular rhythm. noES Respiratory: Effort normal and breath sounds normal. No respiratory distress. He has no wheezes.   Sternotomy incision is clean,dry and intact--healing well.  GI: Soft. Bowel sounds are normal.  Non-tender. Midline incision has healed . Ostomy with bilious drainage. Multiple drains LLQ sutured in place.  Genitourinary:  Foley in place.  Musculoskeletal: He exhibits edema b/l LE  Right knee with effusion. No tenderness.  Skin: Grade 4 sacral decubitus ulcer.  Various drains in place.  Not diaphoretic Right lower shin with stasis changes. Multiple ulcers on bilateral heels Neurological: He is alert and oriented to person,  place, and time.  Able to phonate well with PMSV. Speech clear. Follows basic commands without difficulty.  Psychiatric: He has a normal mood and affect. His speech is normal and behavior is normal. Cognition and memory are normal.  Strength Antigravity through, except b/l hip flexors.   Sensation diminished in feet.    Assessment/Plan: 1. 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 Physiatrist is providing close team supervision and 24 hour management of active medical problems listed below. Physiatrist and rehab team continue to assess barriers to discharge/monitor patient progress toward functional and medical goals. Team conference today please see physician documentation under team conference tab, met with team face-to-face to discuss problems,progress, and goals. Formulized individual treatment plan based on medical history, underlying problem and comorbidities. FIM: Function - Bathing Position: Sitting EOB Body parts bathed by patient: Right arm, Left arm, Chest, Abdomen, Right upper leg, Left upper leg, Right lower leg, Left lower leg Body parts bathed by helper: Back, Buttocks, Front perineal area Bathing not applicable: Front perineal area, Buttocks (RN just cleaned and changed dressing on buttocks) Assist Level: 2 helpers  Function- Upper Body Dressing/Undressing What is the patient wearing?: Pull over shirt/dress Pull over shirt/dress - Perfomed by patient: Thread/unthread right sleeve, Thread/unthread left sleeve Pull over shirt/dress - Perfomed by helper: Put head through opening, Pull shirt over trunk Assist Level: 2 helpers Function - Lower Body Dressing/Undressing What is the patient wearing?: Pants, Non-skid slipper socks Position: Bed Pants- Performed by helper: Thread/unthread right pants  leg, Thread/unthread left pants leg, Pull pants up/down Non-skid slipper socks- Performed by helper: Don/doff right sock, Don/doff left sock Assist for footwear: Dependant Assist for lower body dressing: 2 Helpers  Function - Toileting Toileting activity did not occur: Safety/medical concerns Assist level: Two helpers (per [place], NT report)  Function - Air cabin crew transfer activity did not occur: Safety/medical concerns  Function - Chair/bed transfer Chair/bed transfer activity did not occur: Safety/medical concerns Chair/bed transfer method: Lateral scoot Chair/bed transfer assist level: 2 helpers Chair/bed transfer assistive device: Sliding board  Function - Locomotion: Wheelchair Will patient use wheelchair at discharge?: Yes Type: Manual Wheelchair activity did not occur: Safety/medical concerns Max wheelchair distance: 40 Assist Level: Maximal assistance (Pt 25 - 49%) Wheel 50 feet with 2 turns activity did not occur: Safety/medical concerns Wheel 150 feet activity did not occur: Safety/medical concerns Function - Locomotion: Ambulation Ambulation activity did not occur: Safety/medical concerns Walk 10 feet activity did not occur: Safety/medical  concerns Walk 50 feet with 2 turns activity did not occur: Safety/medical concerns Walk 150 feet activity did not occur: Safety/medical concerns Walk 10 feet on uneven surfaces activity did not occur: Safety/medical concerns  Function - Comprehension Comprehension: Auditory Comprehension assist level: Understands basic 75 - 89% of the time/ requires cueing 10 - 24% of the time  Function - Expression Expression: Verbal Expression assistive device: Talk trach valve Expression assist level: Expresses basic 90% of the time/requires cueing < 10% of the time.  Function - Social Interaction Social Interaction assist level: Interacts appropriately 75 - 89% of the time - Needs redirection for appropriate language or to  initiate interaction.  Function - Problem Solving Problem solving assist level: Solves basic 75 - 89% of the time/requires cueing 10 - 24% of the time  Function - Memory Memory assist level: Recognizes or recalls 50 - 74% of the time/requires cueing 25 - 49% of the time Patient normally able to recall (first 3 days only): Current season, Location of own room, Staff names and faces, That he or she is in a hospital  1. 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 LLE DVT treatment /Anticoagulation: Pharmaceutical: Coumadin 3. Pain Management: Tylenol for prn for pain. Will continue oxycodone prn if needed for severe pain. 4. Mood: LCSW to follow for evaluation and support.  5. Neuropsych: This patient is capable of making decisions on his own behalf. 6. Skin/Wound Care:  appreciate.general surgery for sacral wound debridement, PRAFO ordered for heels, Cont air bed , Santyl collagenase for sacral decubitus. Hydrotherapy for physical therapy 7. Fluids/Electrolytes/Nutrition: Monitor I/O. Will consult dietician to help with food choices. Increase prosource to TID. Added ensure between meals as unable to tolerate a lot of food at one time.  8. Abdominal abscess: Continue drain care bid.  Flagyl to contiue till 10/26 per records. ID on consult, Pt refused contrast has some improvement of at least one abscess, s/p drain removal. Repeat CT next week? 9. Hypokalemia: Improved. 4.0 on 10/17.  10. VDRF/ History of OSA: To continue current trach till mobility improves per CCM. Tolerating humidified air. We will need to reconsult pulmonary before decannulation, will wait until strength improves 11. Anxiety disorder: Will resume low dose Xanax as used this for insomnia at home. given his respiratory  compromise will need to limit dosing.  Improving.  12. Anemia: Likely due to infection/chronic illness. Hgb stable at 8.2 on 10/14.  Will cont to monitor 13. Anasarca: Improved Continue Lasix.  14. Diastolic CHF: Off amiodarone, Ace and statin at this time due to recurrent hypotension and multiple medical issues. Monitor for signs of overload. Check daily weights. Monitor BP bid.  15.  Hypomagnesemia-labs personally reviewed. increase supplement to 800mg BID, repeat Mg level on 10/17 still low cont current dose with recheck next week LOS (Days) 8 A FACE TO FACE EVALUATION WAS PERFORMED   Ankit Anil Patel 09/04/2015, 11:00 AM    

## 2015-09-04 NOTE — Progress Notes (Signed)
Pt called RN to room to reposition. Had NT x2 go to reposition him. He insisted on having RN come to room to reposition him. RN and NT went to reposition patient. Patient complained of pain to buttocks but refused medications. Repositioned several times before leaving room. Will continue to monitor. Rudie MeyerEurillo, Charman Blasco A, RN

## 2015-09-04 NOTE — Progress Notes (Signed)
Patient placed on humidified air (was on humidified oxygen) this morning for a trial on room air.  Patients oxygen saturation has been maintained in the mid to high 90's.  Notified Marissa NestlePam Love, PA who recommended we discontinue use of trach collar.  Will continue to monitor.  Dani Gobbleeardon, Swain Acree J, RN

## 2015-09-04 NOTE — Progress Notes (Signed)
Physical Therapy Note  Patient Details  Name: Jonathan Le MRN: [id number] Date of Birth: 06/13/1946 Today's Date: 09/04/2015    Time: 1100-1155 55 minutes  1:1 No c/o pain at rest. Pt with B LEs wrapped to prevent knee pain.  Bed mobility rolling with max A for LEs, pt able to pull on grab bars to assist with trunk.  Tilt table performed 40 degrees x 3 minutes, 50 degrees x 5 minutes, 45 degrees x 3 minutes.  Pt with L knee pain with wt bearing, able to tolerate tilt table more today, no c/o back pain with tilt table.  Pt returned to bed and positioned appropriately.   Candis Kabel 09/04/2015, 12:48 PM

## 2015-09-05 ENCOUNTER — Inpatient Hospital Stay (HOSPITAL_COMMUNITY): Payer: Medicare Other | Admitting: Occupational Therapy

## 2015-09-05 LAB — GLUCOSE, CAPILLARY
GLUCOSE-CAPILLARY: 88 mg/dL (ref 65–99)
GLUCOSE-CAPILLARY: 108 mg/dL — AB (ref 65–99)
Glucose-Capillary: 123 mg/dL — ABNORMAL HIGH (ref 65–99)
Glucose-Capillary: 110 mg/dL — ABNORMAL HIGH (ref 65–99)

## 2015-09-05 LAB — PROTIME-INR
INR: 2.55 — ABNORMAL HIGH (ref 0.00–1.49)
PROTHROMBIN TIME: 27.1 s — AB (ref 11.6–15.2)

## 2015-09-05 MED ORDER — OXYCODONE HCL 5 MG PO TABS
2.5000 mg | ORAL_TABLET | Freq: Four times a day (QID) | ORAL | Status: DC | PRN
  Administered 2015-09-08 – 2015-09-13 (×5): 2.5 mg via ORAL
  Filled 2015-09-05 (×7): qty 1

## 2015-09-05 MED ORDER — SODIUM CHLORIDE 0.9 % IJ SOLN
10.0000 mL | Freq: Two times a day (BID) | INTRAMUSCULAR | Status: DC
  Administered 2015-09-05 – 2015-09-24 (×12): 10 mL via INTRAVENOUS

## 2015-09-05 MED ORDER — WARFARIN SODIUM 5 MG PO TABS
5.0000 mg | ORAL_TABLET | Freq: Once | ORAL | Status: AC
  Administered 2015-09-05: 5 mg via ORAL
  Filled 2015-09-05: qty 1

## 2015-09-05 NOTE — Progress Notes (Signed)
Occupational Therapy Session Note  Patient Details  Name: Jonathan Le MRN: [id number] Date of Birth: Feb 15, 1946  Today's Date: 09/05/2015 OT Individual Time: 1445-1530 OT Individual Time Calculation (min): 45 min    Short Term Goals: Week 2:  OT Short Term Goal 1 (Week 2): Pt will complete LB dressing at bed level with max assist OT Short Term Goal 2 (Week 2): Pt will donn pullover shirt EOB with close supervision. OT Short Term Goal 3 (Week 2): Pt will perform UB bathing sitting unsupported EOB with supervision, including reaching both upper shoulders.  OT Short Term Goal 4 (Week 2): Pt will transition sidelying to sitting in order to complete seltcare tasks with mod assist 2 consecutive sessions OT Short Term Goal 5 (Week 2): Pt will perform sliding board transfer from bed to wheelchair with max assist.  Skilled Therapeutic Interventions/Progress Updates:    Pt seen this session to facilitate functional mobility and activity tolerance.  Reviewed STGs with patient, he did not want to work on sliding board as he felt that any sliding aggravated his sacral wound. Pt agreed to sit to EOB. Due to severe crepitis in R shoulder and elbow pt was not able to assist pushing sidelying to sit and required total A x 1. When pt layed back in bed to demonstrate something to therapist, a nurse tech assisted this therapist with 2nd sidelying to sit. Pt sat EOB for 30 min with some c/o of sacral pain. Pt worked on lateral reaching within a small plane of motion as all movements aggravated his sacrum. A/arom for BUE to develop strength. Pt adjusted back in bed with all needs met.  Therapy Documentation Precautions:  Precautions Precautions: Fall Precaution Comments: continuous pulse ox, trach, O2, sacral decub with increased pain Restrictions Weight Bearing Restrictions: No Other Position/Activity Restrictions: trach collar    Vital Signs: Therapy Vitals Pulse Rate: (!) 102 Resp: 18 Patient  Position (if appropriate): Sitting Oxygen Therapy SpO2: 92 % O2 Device: Tracheostomy Collar O2 Flow Rate (L/min): 5 L/min FiO2 (%): 28 % Pain:  no pain at rest. C/o pain in sacral area sitting EOB. ADL:  See Function Navigator for Current Functional Status.   Therapy/Group: Individual Therapy  [place] 09/05/2015, 5:07 PM

## 2015-09-05 NOTE — Progress Notes (Signed)
ANTICOAGULATION & ANTIBIOTIC CONSULT NOTE - Follow Up Consult  Pharmacy Consult:  Coumadin + Unasyn Indication: LLE DVT  + Intra-abdominal infection with abscess  Allergies  Allergen Reactions  . Levaquin [Levofloxacin] Nausea Only  . Penicillins Hives  . Doxycycline Rash    Patient Measurements: Height: 5\' 9"  (175.3 cm) Weight: 247 lb 2.2 oz (112.1 kg) IBW/kg (Calculated) : 70.7  Vital Signs: Temp: 98.1 F (36.7 C) (10/22 1252) Temp Source: Oral (10/22 1252) BP: 104/56 mmHg (10/22 1252) Pulse Rate: 101 (10/22 1252)  Labs:  Recent Labs  09/03/15 0450 09/04/15 0455 09/05/15 0605  LABPROT 22.7* 24.5* 27.1*  INR 2.02* 2.23* 2.55*    Estimated Creatinine Clearance: 106.1 mL/min (by C-G formula based on Cr of 0.33).    Assessment: 69 YOM continues on Coumadin for new LLE DVT (08/05/2015).  Patient's INR remains therapeutic and he has been requiring larger Coumadin doses since Flagyl was discontinued.  No bleeding reported.  Pharmacy also managing Unasyn for intra-abdominal infection.  Per documentation, patient will need a protracted course of antibiotic until all of this abscesses are completely resolved.  His renal function has been stable.  Patient has PCN allergy but has tolerated Unasyn up until today where patient's RN notices rashes/hives on his back and knee.  Benadryl is helping patient and RN will continue to administer Benadryl prior to each dose.  RN aware to notify MD should patient become drowsy or sedated due to Benadryl, or should rash/hives become worse.   Goal of Therapy:  INR 2-3 Resolution of infection    Plan:  - Coumadin 5mg  PO today - Daily PT / INR for now - Continue Unasyn 3gm IV Q6H - Pharmacy will sign off of antibiotic dosing and follow peripherally.  Thank you for the consult! - F/U tolerance to Unasyn, Benadryl    Klynn Linnemann D. Laney Potashang, PharmD, BCPS Pager:  [phone] 09/05/2015, 1:22 PM

## 2015-09-05 NOTE — Progress Notes (Signed)
Pt. Placed on oxygen due to not being able to maintain his sats. Pt. Tolerating 28% oxygen at this time. sats around 92-94% at this time.

## 2015-09-05 NOTE — Progress Notes (Addendum)
Subjective/Complaints: Patient states that he slept fairly overnight. He believes he is progressing slowly. Did not have any complaints this morning, however, later informed by nursing about puretic rash.  Medications reviewed, only new medication appears to be a one-time dose of tramadol he received last night. .  ROS: Denies CP, SOB, n/v/d.   Objective: Vital Signs: Blood pressure 112/63, pulse 102, temperature 97.8 F (36.6 C), temperature source Oral, resp. rate 14, height '5\' 9"'  (1.753 m), weight 112.1 kg (247 lb 2.2 oz), SpO2 93 %. No results found. Results for orders placed or performed during the hospital encounter of 08/27/15 (from the past 72 hour(s))  Glucose, capillary     Status: Abnormal   Collection Time: 09/02/15 12:21 PM  Result Value Ref Range   Glucose-Capillary 118 (H) 65 - 99 mg/dL  Glucose, capillary     Status: Abnormal   Collection Time: 09/02/15  5:19 PM  Result Value Ref Range   Glucose-Capillary 202 (H) 65 - 99 mg/dL  Glucose, capillary     Status: Abnormal   Collection Time: 09/02/15  5:24 PM  Result Value Ref Range   Glucose-Capillary 108 (H) 65 - 99 mg/dL  Glucose, capillary     Status: None   Collection Time: 09/02/15  9:59 PM  Result Value Ref Range   Glucose-Capillary 99 65 - 99 mg/dL  Protime-INR     Status: Abnormal   Collection Time: 09/03/15  4:50 AM  Result Value Ref Range   Prothrombin Time 22.7 (H) 11.6 - 15.2 seconds   INR 2.02 (H) 0.00 - 1.49  Glucose, capillary     Status: Abnormal   Collection Time: 09/03/15 12:23 PM  Result Value Ref Range   Glucose-Capillary 124 (H) 65 - 99 mg/dL   Comment 1 Notify RN   Glucose, capillary     Status: Abnormal   Collection Time: 09/03/15  4:19 PM  Result Value Ref Range   Glucose-Capillary 108 (H) 65 - 99 mg/dL   Comment 1 Notify RN   Glucose, capillary     Status: Abnormal   Collection Time: 09/03/15  9:33 PM  Result Value Ref Range   Glucose-Capillary 130 (H) 65 - 99 mg/dL  Protime-INR      Status: Abnormal   Collection Time: 09/04/15  4:55 AM  Result Value Ref Range   Prothrombin Time 24.5 (H) 11.6 - 15.2 seconds   INR 2.23 (H) 0.00 - 1.49  Glucose, capillary     Status: None   Collection Time: 09/04/15  7:01 AM  Result Value Ref Range   Glucose-Capillary 92 65 - 99 mg/dL  Glucose, capillary     Status: Abnormal   Collection Time: 09/04/15 11:47 AM  Result Value Ref Range   Glucose-Capillary 109 (H) 65 - 99 mg/dL  Glucose, capillary     Status: Abnormal   Collection Time: 09/04/15  4:23 PM  Result Value Ref Range   Glucose-Capillary 118 (H) 65 - 99 mg/dL  Glucose, capillary     Status: Abnormal   Collection Time: 09/04/15  8:51 PM  Result Value Ref Range   Glucose-Capillary 121 (H) 65 - 99 mg/dL  Protime-INR     Status: Abnormal   Collection Time: 09/05/15  6:05 AM  Result Value Ref Range   Prothrombin Time 27.1 (H) 11.6 - 15.2 seconds   INR 2.55 (H) 0.00 - 1.49  Glucose, capillary     Status: None   Collection Time: 09/05/15  6:53 AM  Result Value Ref Range  Glucose-Capillary 88 65 - 99 mg/dL    Nursing note and vitals reviewed. Constitutional: He is oriented to person, place, and time.he is slower today He appears well-developed and well-nourished. No distress.  HENT:  Head: Normocephalic and atraumatic.  Eyes: Conjunctivae and EOM are normal.   Neck: Decreased range of motion present.  Trach with dry dressing in place.  Cardiovascular: Normal rate and regular rhythm. noES Respiratory: Effort normal and breath sounds normal. No respiratory distress. He has no wheezes.   Sternotomy incision is clean,dry and intact--healing well.  GI: Soft. Bowel sounds are normal.  Non-tender. Midline incision has healed . Ostomy with bilious drainage. Multiple drains LLQ sutured in place.  Genitourinary:  Foley in place.  Musculoskeletal: He exhibits edema b/l LE  Right knee with effusion. No tenderness.  Skin: Grade 4 sacral decubitus ulcer.  Various  drains in place.  Not diaphoretic Right lower shin with stasis changes. Multiple ulcers on bilateral heels Neurological: He is alert and oriented to person, place, and time.  Able to phonate well with PMSV. Speech clear. Follows basic commands without difficulty.  Psychiatric: He has a normal mood and affect. His speech is normal and behavior is normal. Cognition and memory are normal.  Strength Antigravity through, except b/l hip flexors.   Sensation diminished in feet.    Assessment/Plan: 1. 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 Physiatrist is providing close team supervision and 24 hour management of active medical problems listed below. Physiatrist and rehab team continue to assess barriers to discharge/monitor patient progress toward functional and medical goals. Team conference today please see physician documentation under team conference tab, met with team face-to-face to discuss problems,progress, and goals. Formulized individual treatment plan based on medical history, underlying problem and comorbidities. FIM: Function - Bathing Position: Bed Body parts bathed by patient: Right arm, Left arm, Chest, Abdomen, Right upper leg, Left upper leg, Right lower leg, Left lower leg Body parts bathed by helper: Back, Buttocks, Front perineal area Bathing not applicable: Front perineal area, Buttocks (RN just cleaned and changed dressing on buttocks) Assist Level: 2 helpers  Function- Upper Body Dressing/Undressing What is the patient wearing?: Pull over shirt/dress Pull over shirt/dress - Perfomed by patient: Thread/unthread right sleeve, Thread/unthread left sleeve Pull over shirt/dress - Perfomed by helper: Put head through opening, Pull shirt over trunk Assist Level: 2 helpers Function - Lower Body  Dressing/Undressing What is the patient wearing?: Pants, Non-skid slipper socks Position: Bed Pants- Performed by helper: Thread/unthread right pants leg, Thread/unthread left pants leg, Pull pants up/down Non-skid slipper socks- Performed by helper: Don/doff right sock, Don/doff left sock Assist for footwear: Dependant Assist for lower body dressing: 2 Helpers  Function - Toileting Toileting activity did not occur: Safety/medical concerns Assist level: Two helpers (per Bude, NT report)  Function - Air cabin crew transfer activity did not occur: Safety/medical concerns  Function - Chair/bed transfer Chair/bed transfer activity did not occur: Safety/medical concerns Chair/bed transfer method: Lateral scoot Chair/bed transfer assist level: 2 helpers Chair/bed transfer assistive device: Sliding board  Function - Locomotion: Wheelchair Will patient use wheelchair at discharge?: Yes Type: Manual Wheelchair activity did not occur: Safety/medical concerns Max wheelchair distance: 40 Assist Level: Maximal assistance (Pt 25 - 49%) Wheel 50 feet with 2 turns activity did not occur: Safety/medical concerns Wheel 150 feet activity did not occur: Safety/medical concerns Function -  Locomotion: Ambulation Ambulation activity did not occur: Safety/medical concerns Walk 10 feet activity did not occur: Safety/medical concerns Walk 50 feet with 2 turns activity did not occur: Safety/medical concerns Walk 150 feet activity did not occur: Safety/medical concerns Walk 10 feet on uneven surfaces activity did not occur: Safety/medical concerns  Function - Comprehension Comprehension: Auditory Comprehension assist level: Understands basic 75 - 89% of the time/ requires cueing 10 - 24% of the time  Function - Expression Expression: Verbal Expression assistive device: Talk trach valve Expression assist level: Expresses basic 90% of the time/requires cueing < 10% of the time.  Function -  Social Interaction Social Interaction assist level: Interacts appropriately 75 - 89% of the time - Needs redirection for appropriate language or to initiate interaction.  Function - Problem Solving Problem solving assist level: Solves basic 75 - 89% of the time/requires cueing 10 - 24% of the time  Function - Memory Memory assist level: Recognizes or recalls 50 - 74% of the time/requires cueing 25 - 49% of the time Patient normally able to recall (first 3 days only): Current season, Location of own room, Staff names and faces, That he or she is in a hospital  1. 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 LLE DVT treatment /Anticoagulation: Pharmaceutical: Coumadin, appreciate Pharmacy assistance.   3. Pain Management: Tylenol for prn for pain. Will continue oxycodone prn if needed for severe pain. 4. Mood: LCSW to follow for evaluation and support.  5. Neuropsych: This patient is capable of making decisions on his own behalf. 6. Skin/Wound Care:  appreciate.general surgery for sacral wound debridement, PRAFO ordered for heels, Cont air bed , Santyl collagenase for sacral decubitus. Hydrotherapy for physical therapy 7. Fluids/Electrolytes/Nutrition: Monitor I/O. Will consult dietician to help with food choices. Increase prosource to TID. Added ensure between meals as unable to tolerate a lot of food at one time.   - Pt eating 100% of meals.  8. Abdominal abscess: Continue drain care bid.  Flagyl to contiue till 10/26 per records. ID on consult, Pt refused contrast has some improvement of at least one abscess, s/p drain removal. Repeat CT next week? 9. Hypokalemia: Improved. 4.0 on 10/17.  10. VDRF/ History of OSA: To continue current trach till mobility improves per CCM. Tolerating  humidified air. We will need to reconsult pulmonary before decannulation, will wait until strength improves 11. Anxiety disorder: Will resume low dose Xanax as used this for insomnia at home. given his respiratory compromise will need to limit dosing.  Improving.  12. Anemia: Likely due to infection/chronic illness. Hgb stable at 8.2 on 10/14.  Will cont to monitor 13. Anasarca: Improved Continue Lasix.  14. Diastolic CHF: Off amiodarone, Ace and statin at this time due to recurrent hypotension and multiple medical issues. Monitor for signs of overload. Check daily weights. Monitor BP bid.  15.  Hypomagnesemia-labs personally reviewed. increase supplement to 874m BID, repeat Mg level on 10/18 1.6 still low cont current dose with recheck next week 16. Puretic rash: Medications reviewed. The only new medication the patient seems to have received was a one-time dose of tramadol when necessary last night. Will DC tramadol and order Benadryl. Discussed with nursing the possibility of bed sheets, linens and detergent, however there does not appear to be any recent changes. We'll continue to monitor.  LOS (Days) 9 A  FACE TO FACE EVALUATION WAS PERFORMED Ankit Lorie Phenix 09/05/2015, 9:55 AM

## 2015-09-05 NOTE — Progress Notes (Signed)
Physical Therapy Wound Treatment Patient Details  Name: Jonathan Le MRN: [id number] Date of Birth: August 11, 1946  Today's Date: 09/05/2015 Time:09:55- 10:38   Subjective  Subjective: Expressed concerns over an increase in drainage from wound last night that soaked his bed pads. Also with concerns on not getting hydro treatment on Sunday. Addressed both concerns as best as possible. Pt more calm/assurred after session on these items and now concerned with rash noted to be all over his body during session.                               Patient and Family Stated Goals: Get better Date of Onset:  (prior to this hospitalization) Prior Treatments: I&D, dressing changes  Pain Score: 5/10, premedicated  Wound Assessment     Pressure Ulcer 07/21/15 Stage III -  Full thickness tissue loss. Subcutaneous fat may be visible but bone, tendon or muscle are NOT exposed. (Active)  Dressing Type ABD;Gauze (Comment);Moist to dry;Tape dressing;Other (Comment) 09/05/2015  1:00 PM  Dressing Changed;Clean;Dry;Intact 09/05/2015  1:00 PM  Dressing Change Frequency Twice a day 09/05/2015  1:00 PM  State of Healing Early/partial granulation 09/05/2015  1:00 PM  Site / Wound Assessment Yellow;Red;Pink;Painful 09/05/2015  1:00 PM  % Wound base Red or Granulating 45% 09/05/2015  1:00 PM  % Wound base Yellow 55% 09/05/2015  1:00 PM  % Wound base Black 0% 09/05/2015  1:00 PM  % Wound base Other (Comment) 0% 09/05/2015  1:00 PM  Peri-wound Assessment Intact 09/05/2015  1:00 PM  Wound Length (cm) 8.3 cm 09/01/2015  8:48 AM  Wound Width (cm) 9 cm 09/01/2015  8:48 AM  Wound Depth (cm) 5.5 cm 09/01/2015  8:48 AM  Tunneling (cm) 4 08/27/2015  5:00 PM  Margins Unattached edges (unapproximated) 09/05/2015  1:00 PM  Drainage Amount Copious 09/05/2015  1:00 PM  Drainage Description Serous;No odor 09/05/2015  1:00 PM  Treatment Cleansed;Debridement (Selective);Packing (Saline gauze);Tape changed 09/05/2015  1:00 PM       Hydrotherapy Pulsed lavage therapy - wound location: sacrum/buttocks Pulsed Lavage with Suction (psi): 8 psi (4-8 psi) Pulsed Lavage with Suction - Normal Saline Used: 1000 mL Pulsed Lavage Tip: Tip with splash shield Selective Debridement Selective Debridement - Location: sacrum/buttocks Selective Debridement - Tools Used: Forceps;Scissors Selective Debridement - Tissue Removed: yellow necrotic tissue   Wound Assessment and Plan  Wound Therapy - Assess/Plan/Recommendations Wound Therapy - Clinical Statement: Good granulation around perimeter. More necrotic tissue removed from cavity of wound. ? bone palpable underneath yellow necrotic tissue.                                         Wound Therapy - Functional Problem List: Pt with decr sitting tolerance due to sacral wound. Factors Delaying/Impairing Wound Healing: Immobility;Multiple medical problems;Polypharmacy Hydrotherapy Plan: Debridement;Dressing change;Patient/family education;Pulsatile lavage with suction Wound Therapy - Frequency: 6X / week Wound Therapy - Follow Up Recommendations: Home health RN Wound Plan: See above  Wound Therapy Goals- Improve the function of patient's integumentary system by progressing the wound(s) through the phases of wound healing (inflammation - proliferation - remodeling) by: Decrease Necrotic Tissue to: 10 Decrease Necrotic Tissue - Progress: Progressing toward goal Increase Granulation Tissue to: 90 Increase Granulation Tissue - Progress: Progressing toward goal  Goals will be updated until maximal potential achieved or discharge criteria met.  Discharge criteria: when goals achieved, discharge from hospital, MD decision/surgical intervention, no progress towards goals, refusal/missing three consecutive treatments without notification or medical reason.  GP  Willow Ora 09/05/2015, 1:59 PM  Willow Ora, PTA, CLT Acute Rehab Services [phone] 09/05/15 , 2:00 PM

## 2015-09-06 LAB — GLUCOSE, CAPILLARY
Glucose-Capillary: 124 mg/dL — ABNORMAL HIGH (ref 65–99)
Glucose-Capillary: 106 mg/dL — ABNORMAL HIGH (ref 65–99)
Glucose-Capillary: 98 mg/dL (ref 65–99)
Glucose-Capillary: 151 mg/dL — ABNORMAL HIGH (ref 65–99)

## 2015-09-06 LAB — PROTIME-INR
INR: 2.65 — AB (ref 0.00–1.49)
PROTHROMBIN TIME: 27.9 s — AB (ref 11.6–15.2)

## 2015-09-06 MED ORDER — DIPHENHYDRAMINE HCL 12.5 MG/5ML PO ELIX
25.0000 mg | ORAL_SOLUTION | ORAL | Status: DC
  Administered 2015-09-06 – 2015-09-07 (×4): 25 mg via ORAL
  Filled 2015-09-06 (×6): qty 10

## 2015-09-06 MED ORDER — DIPHENHYDRAMINE-ZINC ACETATE 2-0.1 % EX CREA
TOPICAL_CREAM | Freq: Three times a day (TID) | CUTANEOUS | Status: DC | PRN
  Administered 2015-09-06: 10:00:00 via TOPICAL
  Administered 2015-09-06: 1 via TOPICAL
  Filled 2015-09-06 (×3): qty 28

## 2015-09-06 MED ORDER — SODIUM CHLORIDE 0.9 % IV SOLN
500.0000 mg | Freq: Four times a day (QID) | INTRAVENOUS | Status: DC
  Administered 2015-09-06 – 2015-09-15 (×37): 500 mg via INTRAVENOUS
  Filled 2015-09-06 (×42): qty 500

## 2015-09-06 MED ORDER — IMIPENEM-CILASTATIN 500 MG IV SOLR
500.0000 mg | Freq: Three times a day (TID) | INTRAVENOUS | Status: DC
  Filled 2015-09-06 (×2): qty 500

## 2015-09-06 MED ORDER — WARFARIN SODIUM 5 MG PO TABS
5.0000 mg | ORAL_TABLET | Freq: Once | ORAL | Status: AC
  Administered 2015-09-06: 5 mg via ORAL
  Filled 2015-09-06: qty 1

## 2015-09-06 NOTE — Progress Notes (Signed)
ANTICOAGULATION & ANTIBIOTIC CONSULT NOTE - Follow Up Consult  Pharmacy Consult:  Coumadin + Primaxin Indication:  Recent LLE DVT + Intra-abdominal infection  Allergies  Allergen Reactions  . Levaquin [Levofloxacin] Nausea Only  . Penicillins Hives  . Doxycycline Rash    Patient Measurements: Height: 5\' 9"  (175.3 cm) Weight: 248 lb 9.6 oz (112.764 kg) IBW/kg (Calculated) : 70.7  Vital Signs: Temp: 97.8 F (36.6 C) (10/23 0512) Temp Source: Oral (10/23 0512) BP: 118/65 mmHg (10/23 0512) Pulse Rate: 106 (10/23 0900)  Labs:  Recent Labs  09/04/15 0455 09/05/15 0605 09/06/15 0510  LABPROT 24.5* 27.1* 27.9*  INR 2.23* 2.55* 2.65*    Estimated Creatinine Clearance: 106.3 mL/min (by C-G formula based on Cr of 0.33).    Assessment: 69 YOM continues on Coumadin for new LLE DVT (08/05/2015).  Patient's INR remains therapeutic and he has been requiring larger Coumadin doses since Flagyl was discontinued.  Pink urine was reported yesterday evening, now resolved per RN.   Patient has a complicated antibiotic usage history for Enterococcal intra-abdominal infection with abscesses in setting of decubitus ulcers.  He was placed on a trial of Unasyn but patient now developes diffuse rash/hives.  Patient continues on scheduled Benadryl and Pepcid.  Spoke to ID, will switch patient to back to Primaxin.  His renal function has been stable, but will be cautious with interpretation given age and inactive state.  Previous admission: Primaxin 9/6 >>9/9 Vanc 9/6 >> 9/8, resumed 9/9 >> Zyvox 9/8 >> 9/9 Anidula 9/6 >> (9/12)  9/6 TA - negative 9/6 left abd abscess - Enterococcus (pan sensitive) 9/6 right abd abscess - negative 9/14 and 9/21 TA - negative 9/14 and 9/21 BCx x2 - negative  Current admission: Merrem PTA >> 10/18 Flagyl PTA >> 10/15 Unasyn 10/18 >> 10/23 Primaxin 10/23 >>  9/21 UCx - negative   Goal of Therapy:  INR 2-3  Resolution of infection / rash and hives     Plan:  - Coumadin 5mg  PO today - Daily PT / INR - Monitor closely for s/sx of bleeding.  Check CBC in AM. - Primaxin 500mg  IV Q6H - Monitor renal fxn, clinical progress >> BMET in AM - F/U resolution of rash/hives.  Stop date for Benadryl per MD.   Chelsea Aushuy D. Laney Potashang, PharmD, BCPS Pager:  [phone] 09/06/2015, 11:15 AM

## 2015-09-06 NOTE — Progress Notes (Signed)
Subjective/Complaints: Patient seen this morning lying in bed. He complains of a diffuse erratic macular rash, that is getting worse. He inquires if there is anything that he can have for the itching.  ROS: +puritis.  Denies CP, SOB, n/v/d.   Objective: Vital Signs: Blood pressure 118/65, pulse 105, temperature 97.8 F (36.6 C), temperature source Oral, resp. rate 19, height '5\' 9"'  (1.753 m), weight 112.764 kg (248 lb 9.6 oz), SpO2 98 %. No results found. Results for orders placed or performed during the hospital encounter of 08/27/15 (from the past 72 hour(s))  Glucose, capillary     Status: Abnormal   Collection Time: 09/03/15 12:23 PM  Result Value Ref Range   Glucose-Capillary 124 (H) 65 - 99 mg/dL   Comment 1 Notify RN   Glucose, capillary     Status: Abnormal   Collection Time: 09/03/15  4:19 PM  Result Value Ref Range   Glucose-Capillary 108 (H) 65 - 99 mg/dL   Comment 1 Notify RN   Glucose, capillary     Status: Abnormal   Collection Time: 09/03/15  9:33 PM  Result Value Ref Range   Glucose-Capillary 130 (H) 65 - 99 mg/dL  Protime-INR     Status: Abnormal   Collection Time: 09/04/15  4:55 AM  Result Value Ref Range   Prothrombin Time 24.5 (H) 11.6 - 15.2 seconds   INR 2.23 (H) 0.00 - 1.49  Glucose, capillary     Status: None   Collection Time: 09/04/15  7:01 AM  Result Value Ref Range   Glucose-Capillary 92 65 - 99 mg/dL  Glucose, capillary     Status: Abnormal   Collection Time: 09/04/15 11:47 AM  Result Value Ref Range   Glucose-Capillary 109 (H) 65 - 99 mg/dL  Glucose, capillary     Status: Abnormal   Collection Time: 09/04/15  4:23 PM  Result Value Ref Range   Glucose-Capillary 118 (H) 65 - 99 mg/dL  Glucose, capillary     Status: Abnormal   Collection Time: 09/04/15  8:51 PM  Result Value Ref Range   Glucose-Capillary 121 (H) 65 - 99 mg/dL  Protime-INR     Status: Abnormal   Collection Time: 09/05/15  6:05 AM  Result Value Ref Range   Prothrombin Time 27.1  (H) 11.6 - 15.2 seconds   INR 2.55 (H) 0.00 - 1.49  Glucose, capillary     Status: None   Collection Time: 09/05/15  6:53 AM  Result Value Ref Range   Glucose-Capillary 88 65 - 99 mg/dL  Glucose, capillary     Status: Abnormal   Collection Time: 09/05/15 11:47 AM  Result Value Ref Range   Glucose-Capillary 123 (H) 65 - 99 mg/dL  Glucose, capillary     Status: Abnormal   Collection Time: 09/05/15  4:21 PM  Result Value Ref Range   Glucose-Capillary 110 (H) 65 - 99 mg/dL  Glucose, capillary     Status: Abnormal   Collection Time: 09/05/15  9:34 PM  Result Value Ref Range   Glucose-Capillary 108 (H) 65 - 99 mg/dL  Protime-INR     Status: Abnormal   Collection Time: 09/06/15  5:10 AM  Result Value Ref Range   Prothrombin Time 27.9 (H) 11.6 - 15.2 seconds   INR 2.65 (H) 0.00 - 1.49  Glucose, capillary     Status: Abnormal   Collection Time: 09/06/15  6:44 AM  Result Value Ref Range   Glucose-Capillary 124 (H) 65 - 99 mg/dL    Nursing  note and vitals reviewed. Constitutional: He is oriented to person, place, and time.he is slower today He appears well-developed and well-nourished. No distress.  HENT:  Head: Normocephalic and atraumatic.  Eyes: Conjunctivae and EOM are normal.   Neck: Decreased range of motion present.  Trach with dry dressing in place.  Cardiovascular: Normal rate and regular rhythm. noES Respiratory: Effort normal and breath sounds normal. No respiratory distress. He has no wheezes.   Sternotomy incision is clean,dry and intact--healing well.  GI: Soft. Bowel sounds are normal.  Non-tender. Midline incision has healed . Ostomy with bilious drainage. Multiple drains LLQ sutured in place.  Genitourinary:  Foley in place.  Musculoskeletal: He exhibits edema b/l LE  Right knee with effusion. No tenderness.  Skin: Grade 4 sacral decubitus ulcer.  Various drains in place.  Not diaphoretic Right lower shin with stasis changes. Multiple ulcers on  bilateral heels Diffuse macular rash Neurological: He is alert and oriented to person, place, and time.  Able to phonate well with PMSV. Speech clear. Follows basic commands without difficulty.  Psychiatric: He has a normal mood and affect. His speech is normal and behavior is normal. Cognition and memory are normal.  Strength Antigravity through, except b/l hip flexors.   Sensation diminished in feet.    Assessment/Plan: 1. 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 Physiatrist is providing close team supervision and 24 hour management of active medical problems listed below. Physiatrist and rehab team continue to assess barriers to discharge/monitor patient progress toward functional and medical goals. Team conference today please see physician documentation under team conference tab, met with team face-to-face to discuss problems,progress, and goals. Formulized individual treatment plan based on medical history, underlying problem and comorbidities. FIM: Function - Bathing Position: Bed Body parts bathed by patient: Right arm, Left arm, Chest, Abdomen, Right upper leg, Left upper leg, Right lower leg, Left lower leg Body parts bathed by helper: Back, Buttocks, Front perineal area Bathing not applicable: Front perineal area, Buttocks (RN just cleaned and changed dressing on buttocks) Assist Level: 2 helpers  Function- Upper Body Dressing/Undressing What is the patient wearing?: Pull over shirt/dress Pull over shirt/dress - Perfomed by patient: Thread/unthread right sleeve, Thread/unthread left sleeve Pull over shirt/dress - Perfomed by helper: Put head through opening, Pull shirt over trunk Assist Level: 2 helpers Function - Lower Body Dressing/Undressing What is the patient wearing?: Pants, Non-skid  slipper socks Position: Bed Pants- Performed by helper: Thread/unthread right pants leg, Thread/unthread left pants leg, Pull pants up/down Non-skid slipper socks- Performed by helper: Don/doff right sock, Don/doff left sock Assist for footwear: Dependant Assist for lower body dressing: 2 Helpers  Function - Toileting Toileting activity did not occur: Safety/medical concerns Assist level: Two helpers (per [place], NT report)  Function - Air cabin crew transfer activity did not occur: Safety/medical concerns  Function - Chair/bed transfer Chair/bed transfer activity did not occur: Safety/medical concerns Chair/bed transfer method: Lateral scoot Chair/bed transfer assist level: 2 helpers Chair/bed transfer assistive device: Sliding board  Function - Locomotion: Wheelchair Will patient use wheelchair at discharge?: Yes Type: Manual Wheelchair activity did not occur: Safety/medical concerns Max wheelchair distance: 40 Assist Level: Maximal assistance (Pt 25 - 49%) Wheel 50 feet with 2 turns activity did not occur: Safety/medical concerns Wheel 150 feet activity did not occur: Safety/medical concerns Function - Locomotion: Ambulation Ambulation activity did not occur:  Safety/medical concerns Walk 10 feet activity did not occur: Safety/medical concerns Walk 50 feet with 2 turns activity did not occur: Safety/medical concerns Walk 150 feet activity did not occur: Safety/medical concerns Walk 10 feet on uneven surfaces activity did not occur: Safety/medical concerns  Function - Comprehension Comprehension: Auditory Comprehension assist level: Understands basic 75 - 89% of the time/ requires cueing 10 - 24% of the time  Function - Expression Expression: Verbal Expression assistive device: Talk trach valve Expression assist level: Expresses basic 90% of the time/requires cueing < 10% of the time.  Function - Social Interaction Social Interaction assist level: Interacts  appropriately 75 - 89% of the time - Needs redirection for appropriate language or to initiate interaction.  Function - Problem Solving Problem solving assist level: Solves basic 75 - 89% of the time/requires cueing 10 - 24% of the time  Function - Memory Memory assist level: Recognizes or recalls 50 - 74% of the time/requires cueing 25 - 49% of the time Patient normally able to recall (first 3 days only): Current season, Location of own room, Staff names and faces, That he or she is in a hospital  1. 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 LLE DVT treatment /Anticoagulation: Pharmaceutical: Coumadin, appreciate Pharmacy assistance.   3. Pain Management: Tylenol for prn for pain. Will continue oxycodone prn if needed for severe pain. 4. Mood: LCSW to follow for evaluation and support.  5. Neuropsych: This patient is capable of making decisions on his own behalf. 6. Skin/Wound Care:  appreciate.general surgery for sacral wound debridement, PRAFO ordered for heels, Cont air bed , Santyl collagenase for sacral decubitus. Hydrotherapy for physical therapy 7. Fluids/Electrolytes/Nutrition: Monitor I/O. Will consult dietician to help with food choices. Increase prosource to TID. Added ensure between meals as unable to tolerate a lot of food at one time.   - Pt eating 100% of meals.  8. Abdominal abscess: Continue drain care bid.  Flagyl to contiue till 10/26 per records. ID on consult, Pt refused contrast has some improvement of at least one abscess, s/p drain removal. Repeat CT next week? 9. Hypokalemia: Improved. 4.0 on 10/17. Labs ordered for tomorrow. 10. VDRF/ History of OSA: To continue current trach till mobility improves per CCM. Tolerating humidified air. We will need to reconsult  pulmonary before decannulation, will wait until strength improves 11. Anxiety disorder: Will resume low dose Xanax as used this for insomnia at home. given his respiratory compromise will need to limit dosing.  Improving.  12. Anemia: Likely due to infection/chronic illness. Hgb stable at 8.2 on 10/14.  Will cont to monitor. Labs are reviewed for tomorrow a 13. Anasarca: Improved Continue Lasix.  14. Diastolic CHF: Off amiodarone, Ace and statin at this time due to recurrent hypotension and multiple medical issues. Monitor for signs of overload. Check daily weights. Monitor BP bid.  15.  Hypomagnesemia-labs personally reviewed. increase supplement to 839m BID, repeat Mg level on 10/18 1.6 still low cont current dose.  We'll recheck tomorrow. 16. Puretic rash: Medications reviewed. The rash started 2 days ago and the only new medication he had received around that time was tramadol, so tramadol was DC'd yesterday, however rashes getting worse. Patient does have an allergy to penicillin and is on Unasyn. Spoke to pharmacy regarding other antibiotic options, however at this time vancomycin appears to be  the only other option and would not be ideal for discharge. Therefore, we will schedule Benadryl as well as providing Benadryl cream to local areas of increased puritis. Will continue to monitor.  LOS (Days) 10 A FACE TO FACE EVALUATION WAS PERFORMED Jamonica Schoff Lorie Phenix 09/06/2015, 8:21 AM

## 2015-09-06 NOTE — Progress Notes (Addendum)
Nursing Note: Pt has significant rash to legs,inner thigh and abd.Pt is on unasyn.A: Notified MD on-call and no new orders obtained. To continue unasyn.wbbAlso made Dr. Allena KatzPatel aware that urine is cherry colored in foley tube.No new orders obtained.Will see pt in am.wbb

## 2015-09-06 NOTE — Progress Notes (Signed)
Rash ,flat red, ruddy spots, to abd,legs, chest under trach, Pt has refused his IV unasyn this shift. Itcheswbb

## 2015-09-07 ENCOUNTER — Ambulatory Visit (HOSPITAL_COMMUNITY): Payer: Medicare Other

## 2015-09-07 ENCOUNTER — Inpatient Hospital Stay (HOSPITAL_COMMUNITY): Payer: Medicare Other | Admitting: Speech Pathology

## 2015-09-07 ENCOUNTER — Encounter (HOSPITAL_COMMUNITY): Payer: Medicare Other

## 2015-09-07 ENCOUNTER — Inpatient Hospital Stay (HOSPITAL_COMMUNITY): Payer: Medicare Other | Admitting: Physical Therapy

## 2015-09-07 ENCOUNTER — Inpatient Hospital Stay (HOSPITAL_COMMUNITY): Payer: Medicare Other | Admitting: Occupational Therapy

## 2015-09-07 LAB — CBC WITH DIFFERENTIAL/PLATELET
BASOS ABS: 0 10*3/uL (ref 0.0–0.1)
BASOS PCT: 0 %
EOS ABS: 1.7 10*3/uL — AB (ref 0.0–0.7)
Eosinophils Relative: 27 %
HCT: 31.2 % — ABNORMAL LOW (ref 39.0–52.0)
HEMOGLOBIN: 9.3 g/dL — AB (ref 13.0–17.0)
LYMPHS PCT: 20 %
Lymphs Abs: 1.3 10*3/uL (ref 0.7–4.0)
MCH: 26.6 pg (ref 26.0–34.0)
MCHC: 29.8 g/dL — AB (ref 30.0–36.0)
MCV: 89.4 fL (ref 78.0–100.0)
MONO ABS: 0.5 10*3/uL (ref 0.1–1.0)
Monocytes Relative: 8 %
NEUTROS PCT: 45 %
Neutro Abs: 2.8 10*3/uL (ref 1.7–7.7)
PLATELETS: 311 10*3/uL (ref 150–400)
RBC: 3.49 MIL/uL — ABNORMAL LOW (ref 4.22–5.81)
RDW: 16.4 % — ABNORMAL HIGH (ref 11.5–15.5)
WBC: 6.3 10*3/uL (ref 4.0–10.5)

## 2015-09-07 LAB — BASIC METABOLIC PANEL
Anion gap: 4 — ABNORMAL LOW (ref 5–15)
BUN: 9 mg/dL (ref 6–20)
CO2: 30 mmol/L (ref 22–32)
CREATININE: 0.37 mg/dL — AB (ref 0.61–1.24)
Calcium: 8.1 mg/dL — ABNORMAL LOW (ref 8.9–10.3)
Chloride: 103 mmol/L (ref 101–111)
Glucose, Bld: 95 mg/dL (ref 65–99)
POTASSIUM: 3.5 mmol/L (ref 3.5–5.1)
SODIUM: 137 mmol/L (ref 135–145)

## 2015-09-07 LAB — GLUCOSE, CAPILLARY
GLUCOSE-CAPILLARY: 93 mg/dL (ref 65–99)
Glucose-Capillary: 90 mg/dL (ref 65–99)
Glucose-Capillary: 93 mg/dL (ref 65–99)

## 2015-09-07 LAB — PROTIME-INR
INR: 2.7 — ABNORMAL HIGH (ref 0.00–1.49)
PROTHROMBIN TIME: 28.3 s — AB (ref 11.6–15.2)

## 2015-09-07 LAB — MAGNESIUM: MAGNESIUM: 1.7 mg/dL (ref 1.7–2.4)

## 2015-09-07 MED ORDER — WARFARIN SODIUM 5 MG PO TABS
5.0000 mg | ORAL_TABLET | Freq: Once | ORAL | Status: AC
  Administered 2015-09-07: 5 mg via ORAL
  Filled 2015-09-07: qty 1

## 2015-09-07 NOTE — Progress Notes (Signed)
Jonathan Le, Jonathan LeLevofloxacin] Nausea Only  . Penicillins Le  . Doxycycline Rash    Patient Measurements: Height: 5\' 9"  (175.3 cm) Weight: 248 lb 11.2 oz (112.81 kg) IBW/kg (Calculated) : 70.7  Vital Signs: Temp: 97.8 F (36.6 C) (10/24 0500) Temp Source: Oral (10/24 0500) BP: 112/70 mmHg (10/24 0500) Pulse Rate: 107 (10/24 1212)  Labs:  Recent Labs  09/05/15 0605 09/06/15 0510 09/07/15 0433  HGB  --   --  9.3*  HCT  --   --  31.2*  PLT  --   --  311  LABPROT 27.1* 27.9* 28.3*  INR 2.55* 2.65* 2.70*  CREATININE  --   --  0.37*    Estimated Creatinine Clearance: 106.3 mL/min (by C-G formula based on Cr of 0.37).    Assessment: 69 YOM continues on Coumadin for new LLE DVT (08/05/2015).  Patient's INR 2.70 today, remains therapeutic. He has been requiring larger Coumadin doses since Flagyl was discontinued.  No bleeding reported. Currently on IV Primaxin for enterococcal intra-abdominal infection with abscesses in setting of decubitus ulcers.  Unasyn discontinued 10/22 due to development of diffuse rash/Le. Switched to Big LotsPrimaxin.  His renal function has been stable, but will be cautious with interpretation given age and inactive state.  MD noted today that rash is getting better off unasyn and tramadol.   Previous admission: Primaxin 9/6 >>9/9 Vanc 9/6 >> 9/8, resumed 9/9 >> Zyvox 9/8 >> 9/9 [place] 9/6 >> (9/12)  9/6 TA - negative 9/6 left abd abscess - Enterococcus (pan sensitive) 9/6 right abd abscess - negative 9/14 and 9/21 TA - negative 9/14 and 9/21 BCx x2 - negative  Current admission: Merrem PTA >> 10/18 Flagyl PTA >> 10/15 Unasyn 10/18 >>  10/23 Primaxin 10/23 >>  9/21 UCx - negative   Goal of Therapy:  INR 2-3  Resolution of infection / rash and Le    Plan:  - Coumadin 5mg  PO today - Daily PT / INR - Monitor closely for s/sx of bleeding.  Check CBC in AM. - Primaxin 500mg  IV Q6H - Monitor renal fxn, clinical progress >> BMET in AM - F/U resolution of rash/Le.  Stop date for Benadryl per MD.   Noah Delaineuth Nichalas Coin, RPh Clinical Pharmacist Pager: [phone] 09/07/2015, 1:18 PM

## 2015-09-07 NOTE — Progress Notes (Signed)
Speech Language Pathology Daily Session Note  Patient Details  Name: Jonathan HarpsHarold D Osmer MRN: [id number] Date of Birth: 05/10/1946  Today's Date: 09/07/2015 SLP Individual Time: 1300-1330 SLP Individual Time Calculation (min): 30 min  Short Term Goals: Week 2: SLP Short Term Goal 1 (Week 2): STG=LTG as he is quickly approaching long term goals   Skilled Therapeutic Interventions:  Pt was seen for skilled ST targeting cognitive goals.  SLP facilitated the session with a semi-complex medication management task targeting recall of new information.  Pt recalled function and frequencies of medications when named for ~50% accuracy to generate a written list of currently scheduled medications with mod assist verbal cues.  Pt's recall of previously known medications was significantly better than his recall of newer medications. Pt was noted with increased fatigue as session progressed and required intermittent auditory and tactile stim to maintain alertness; therefore, session ended early due to fatigue.  Will attempt to see pt for remaining time later this afternoon.  Continue per current plan of care.    Function:  Eating Eating                 Cognition Comprehension Comprehension assist level: Understands basic 75 - 89% of the time/ requires cueing 10 - 24% of the time  Expression Expression assistive device: Talk trach valve Expression assist level: Expresses basic 90% of the time/requires cueing < 10% of the time.  Social Interaction Social Interaction assist level: Interacts appropriately 90% of the time - Needs monitoring or encouragement for participation or interaction.  Problem Solving Problem solving assist level: Solves basic 75 - 89% of the time/requires cueing 10 - 24% of the time  Memory Memory assist level: Recognizes or recalls 50 - 74% of the time/requires cueing 25 - 49% of the time    Pain Pain Assessment Pain Assessment: No/denies pain  Therapy/Group: Individual  Therapy  Enes Wegener, Melanee SpryNicole L 09/07/2015, 1:54 PM

## 2015-09-07 NOTE — Progress Notes (Signed)
Subjective/Complaints: Patient feels like rash is improving  ROS: +puritis.  Denies CP, SOB, n/v/d.   Objective: Vital Signs: Blood pressure 112/70, pulse 107, temperature 97.8 F (36.6 C), temperature source Oral, resp. rate 16, height _0  (1.753 m), weight 112.81 kg (248 lb 11.2 oz), SpO2 98 %. No results found. Results for orders placed or performed during the hospital encounter of 08/27/15 (from the past 72 hour(s))  Glucose, capillary     Status: Abnormal   Collection Time: 09/04/15  4:23 PM  Result Value Ref Range   Glucose-Capillary 118 (H) 65 - 99 mg/dL  Glucose, capillary     Status: Abnormal   Collection Time: 09/04/15  8:51 PM  Result Value Ref Range   Glucose-Capillary 121 (H) 65 - 99 mg/dL  Protime-INR     Status: Abnormal   Collection Time: 09/05/15  6:05 AM  Result Value Ref Range   Prothrombin Time 27.1 (H) 11.6 - 15.2 seconds   INR 2.55 (H) 0.00 - 1.49  Glucose, capillary     Status: None   Collection Time: 09/05/15  6:53 AM  Result Value Ref Range   Glucose-Capillary 88 65 - 99 mg/dL  Glucose, capillary     Status: Abnormal   Collection Time: 09/05/15 11:47 AM  Result Value Ref Range   Glucose-Capillary 123 (H) 65 - 99 mg/dL  Glucose, capillary     Status: Abnormal   Collection Time: 09/05/15  4:21 PM  Result Value Ref Range   Glucose-Capillary 110 (H) 65 - 99 mg/dL  Glucose, capillary     Status: Abnormal   Collection Time: 09/05/15  9:34 PM  Result Value Ref Range   Glucose-Capillary 108 (H) 65 - 99 mg/dL  Protime-INR     Status: Abnormal   Collection Time: 09/06/15  5:10 AM  Result Value Ref Range   Prothrombin Time 27.9 (H) 11.6 - 15.2 seconds   INR 2.65 (H) 0.00 - 1.49  Glucose, capillary     Status: Abnormal   Collection Time: 09/06/15  6:44 AM  Result Value Ref Range   Glucose-Capillary 124 (H) 65 - 99 mg/dL  Glucose, capillary     Status: Abnormal   Collection Time: 09/06/15 11:31 AM  Result Value Ref Range   Glucose-Capillary 106 (H) 65  - 99 mg/dL  Glucose, capillary     Status: None   Collection Time: 09/06/15  4:27 PM  Result Value Ref Range   Glucose-Capillary 98 65 - 99 mg/dL  Glucose, capillary     Status: Abnormal   Collection Time: 09/06/15  9:51 PM  Result Value Ref Range   Glucose-Capillary 151 (H) 65 - 99 mg/dL  Protime-INR     Status: Abnormal   Collection Time: 09/07/15  4:33 AM  Result Value Ref Range   Prothrombin Time 28.3 (H) 11.6 - 15.2 seconds   INR 2.70 (H) 0.00 - 1.49  CBC with Differential/Platelet     Status: Abnormal   Collection Time: 09/07/15  4:33 AM  Result Value Ref Range   WBC 6.3 4.0 - 10.5 K/uL   RBC 3.49 (L) 4.22 - 5.81 MIL/uL   Hemoglobin 9.3 (L) 13.0 - 17.0 g/dL   HCT 31.2 (L) 39.0 - 52.0 %   MCV 89.4 78.0 - 100.0 fL   MCH 26.6 26.0 - 34.0 pg   MCHC 29.8 (L) 30.0 - 36.0 g/dL   RDW 16.4 (H) 11.5 - 15.5 %   Platelets 311 150 - 400 K/uL   Neutrophils Relative %  45 %   Lymphocytes Relative 20 %   Monocytes Relative 8 %   Eosinophils Relative 27 %   Basophils Relative 0 %   Neutro Abs 2.8 1.7 - 7.7 K/uL   Lymphs Abs 1.3 0.7 - 4.0 K/uL   Monocytes Absolute 0.5 0.1 - 1.0 K/uL   Eosinophils Absolute 1.7 (H) 0.0 - 0.7 K/uL   Basophils Absolute 0.0 0.0 - 0.1 K/uL   Smear Review MORPHOLOGY UNREMARKABLE   Basic metabolic panel     Status: Abnormal   Collection Time: 09/07/15  4:33 AM  Result Value Ref Range   Sodium 137 135 - 145 mmol/L   Potassium 3.5 3.5 - 5.1 mmol/L   Chloride 103 101 - 111 mmol/L   CO2 30 22 - 32 mmol/L   Glucose, Bld 95 65 - 99 mg/dL   BUN 9 6 - 20 mg/dL   Creatinine, Ser 0.37 (L) 0.61 - 1.24 mg/dL   Calcium 8.1 (L) 8.9 - 10.3 mg/dL   GFR calc non Af Amer >60 >60 mL/min   GFR calc Af Amer >60 >60 mL/min    Comment: (NOTE) The eGFR has been calculated using the CKD EPI equation. This calculation has not been validated in all clinical situations. eGFR's persistently <60 mL/min signify possible Chronic Kidney Disease.    Anion gap 4 (L) 5 - 15   Magnesium     Status: None   Collection Time: 09/07/15  4:33 AM  Result Value Ref Range   Magnesium 1.7 1.7 - 2.4 mg/dL  Glucose, capillary     Status: None   Collection Time: 09/07/15  7:04 AM  Result Value Ref Range   Glucose-Capillary 93 65 - 99 mg/dL  Glucose, capillary     Status: None   Collection Time: 09/07/15 11:24 AM  Result Value Ref Range   Glucose-Capillary 90 65 - 99 mg/dL    Nursing note and vitals reviewed. Constitutional: He is oriented to person, place, and time.he is slower today He appears well-developed and well-nourished. No distress.  HENT:  Head: Normocephalic and atraumatic.  Eyes: Conjunctivae and EOM are normal.   Neck: Decreased range of motion present.  Trach with dry dressing in place.  Cardiovascular: Normal rate and regular rhythm. noES Respiratory: Effort normal and breath sounds normal. No respiratory distress. He has no wheezes.   Sternotomy incision is clean,dry and intact--healing well.  GI: Soft. Bowel sounds are normal.  Non-tender. Midline incision has healed . Ostomy with bilious drainage. Multiple drains LLQ sutured in place.no erythema  Genitourinary:  Foley in place.  Musculoskeletal: He exhibits edema b/l LE  Right knee with effusion. No tenderness.  Skin: Grade 4 sacral decubitus ulcer.  Various drains in place.  Not diaphoretic Right lower shin with stasis changes. Multiple ulcers on bilateral heels Diffuse macular rash Neurological: He is alert and oriented to person, place, and time.  Able to phonate well with PMSV. Speech clear. Follows basic commands without difficulty.  Psychiatric: He has a normal mood and affect. His speech is normal and behavior is normal. Cognition and memory are normal.  Strength Antigravity through, except b/l hip flexors.   Sensation diminished in feet.    Assessment/Plan: 1. 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 Physiatrist is providing close team supervision and 24 hour management of active medical problems listed below. Physiatrist and rehab team continue  to assess barriers to discharge/monitor patient progress toward functional and medical goals.  FIM: Function - Bathing Position: Bed Body parts bathed by patient: Right arm, Left arm, Chest, Abdomen, Right upper leg, Left upper leg, Front perineal area Body parts bathed by helper: Right lower leg, Left lower leg, Back Bathing not applicable: Buttocks Assist Level: 2 helpers  Function- Upper Body Dressing/Undressing What is the patient wearing?: Pull over shirt/dress Pull over shirt/dress - Perfomed by patient: Thread/unthread right sleeve, Thread/unthread left sleeve, Put head through opening Pull over shirt/dress - Perfomed by helper: Pull shirt over trunk Assist Level: 2 helpers Function - Lower Body Dressing/Undressing What is the patient wearing?: Pants, Underwear, Non-skid slipper socks Position: Bed Underwear - Performed by helper: Thread/unthread right underwear leg, Thread/unthread left underwear leg, Pull underwear up/down Pants- Performed by helper: Thread/unthread right pants leg, Thread/unthread left pants leg, Pull pants up/down Non-skid slipper socks- Performed by helper: Don/doff right sock, Don/doff left sock Assist for footwear: Dependant Assist for lower body dressing: 2 Helpers  Function - Toileting Toileting activity did not occur: Safety/medical concerns Assist level: Two helpers (per [place], NT report)  Function - Air cabin crew transfer activity did not occur: Safety/medical concerns  Function - Chair/bed transfer Chair/bed transfer activity did not occur: Safety/medical concerns Chair/bed transfer method: Lateral scoot Chair/bed transfer assist level: 2 helpers Chair/bed transfer  assistive device: Sliding board  Function - Locomotion: Wheelchair Will patient use wheelchair at discharge?: Yes Type: Manual Wheelchair activity did not occur: Safety/medical concerns Max wheelchair distance: 40 Assist Level: Maximal assistance (Pt 25 - 49%) Wheel 50 feet with 2 turns activity did not occur: Safety/medical concerns Wheel 150 feet activity did not occur: Safety/medical concerns Function - Locomotion: Ambulation Ambulation activity did not occur: Safety/medical concerns Walk 10 feet activity did not occur: Safety/medical concerns Walk 50 feet with 2 turns activity did not occur: Safety/medical concerns Walk 150 feet activity did not occur: Safety/medical concerns Walk 10 feet on uneven surfaces activity did not occur: Safety/medical concerns  Function - Comprehension Comprehension: Auditory Comprehension assist level: Understands basic 75 - 89% of the time/ requires cueing 10 - 24% of the time  Function - Expression Expression: Verbal Expression assistive device: Talk trach valve Expression assist level: Expresses basic 90% of the time/requires cueing < 10% of the time.  Function - Social Interaction Social Interaction assist level: Interacts appropriately 75 - 89% of the time - Needs redirection for appropriate language or to initiate interaction.  Function - Problem Solving Problem solving assist level: Solves basic 75 - 89% of the time/requires cueing 10 - 24% of the time  Function - Memory Memory assist level: Recognizes or recalls 50 - 74% of the time/requires cueing 25 - 49% of the time Patient normally able to recall (first 3 days only): Current season, Location of own room, Staff names and faces, That he or she is in a hospital  1. 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 LLE DVT treatment /Anticoagulation: Pharmaceutical: Coumadin, appreciate Pharmacy assistance.   3. Pain Management: Tylenol for prn for pain. Will continue oxycodone prn if needed for severe pain. 4. Mood: LCSW to follow for evaluation and support.  5. Neuropsych: This patient is capable of making decisions on his own behalf. 6. Skin/Wound Care:  appreciate.general surgery for sacral wound debridement, PRAFO  ordered for heels, Cont air bed , Santyl collagenase for sacral decubitus. Hydrotherapy for physical therapy 7. Fluids/Electrolytes/Nutrition: Monitor I/O. Will consult dietician to help with food choices. Increase prosource to TID. Added ensure between meals as unable to tolerate a lot of food at one time.   - Pt eating 100% of meals.  8. Abdominal abscess: Continue drain care bid.  Flagyl to contiue till 10/26 per records. ID on consult, Pt refused contrast has some improvement of at least one abscess, s/p drain removal.due to Unasyn allergy, switched to imipenem 9. Hypokalemia: Improved. 4.0 on 10/17. Labs ordered for tomorrow. 10. VDRF/ History of OSA: To continue current trach till mobility improves per CCM. Tolerating humidified air. We will need to reconsult pulmonary before decannulation, will wait until strength improves 11. Anxiety disorder: Will resume low dose Xanax as used this for insomnia at home. given his respiratory compromise will need to limit dosing.  Improving.  12. Anemia: Likely due to infection/chronic illness. Hgb stable at 8.2 on 10/14.  Will cont to monitor. Labs are reviewed for tomorrow a 13. Anasarca: Improved Continue Lasix.  14. Diastolic CHF: Off amiodarone, Ace and statin at this time due to recurrent hypotension and multiple medical issues. Monitor for signs of overload. Check daily weights. Monitor BP bid.  15.  Hypomagnesemia-labs personally reviewed. increase supplement to 890m BID, repeat Mg  level on 10/18 1.6 still low cont current dose.  We'll recheck tomorrow. 16. Puretic rash: Medications reviewed. The rash started 3 days ago and the only new medication he had received around that time was tramadol, so tramadol was DC'd 09/05/2015,Unasyn discontinued 10/22/2016rashes getting better. Patient does have an allergy to penicillin .  LOS (Days) 11 A FACE TO FACE EVALUATION WAS PERFORMED KIRSTEINS,ANDREW E 09/07/2015, 12:33 PM

## 2015-09-07 NOTE — Progress Notes (Signed)
Physical Therapy Wound Treatment Patient Details  Name: Jonathan Le MRN: [id number] Date of Birth: 1946/09/17  Today's Date: 09/07/2015 Time: 4709-6283 Time Calculation (min): 37 min  Subjective  Subjective: How was your weekend? Patient and Family Stated Goals: Get better Date of Onset:  (prior to this hospitalization) Prior Treatments: I&D, dressing changes  Pain Score:   Denies need for any pain medication at this time.  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 Subcutaneous fat may be visible but bone, tendon or muscle are NOT exposed. (Active)  Dressing Type ABD;Barrier Film (skin prep);Gauze (Comment);Moist to dry;Tape dressing;Other (Comment) 09/07/2015 10:13 AM  Dressing Changed;Clean;Dry;Intact 09/07/2015 10:13 AM  Dressing Change Frequency Twice a day 09/07/2015 10:13 AM  State of Healing Early/partial granulation 09/07/2015 10:13 AM  Site / Wound Assessment Pink;Red;Yellow 09/07/2015 10:13 AM  % Wound base Red or Granulating 45% 09/07/2015 10:13 AM  % Wound base Yellow 55% 09/07/2015 10:13 AM  % Wound base Black 0% 09/07/2015 10:13 AM  % Wound base Other (Comment) 0% 09/07/2015 10:13 AM  Peri-wound Assessment Intact 09/07/2015 10:13 AM  Wound Length (cm) 7.9 cm 09/07/2015 10:13 AM  Wound Width (cm) 9.1 cm 09/07/2015 10:13 AM  Wound Depth (cm) 4.5 cm 09/07/2015 10:13 AM  Tunneling (cm) 5.6 at 12-1:00 o'clock 09/07/2015 10:13 AM  Margins Unattached edges (unapproximated) 09/07/2015 10:13 AM  Drainage Amount Copious 09/07/2015 10:13 AM  Drainage Description Serosanguineous;No odor 09/07/2015 10:13 AM  Treatment Debridement (Selective);Hydrotherapy (Pulse lavage);Packing (Saline gauze);Tape changed;Other (Comment) 09/07/2015 10:13 AM     Santyl applied to wound bed prior to applying dressing.  Hydrotherapy Pulsed lavage therapy - wound location: sacrum/buttocks Pulsed Lavage with Suction (psi): 8 psi (4-8) Pulsed Lavage  with Suction - Normal Saline Used: 1000 mL Pulsed Lavage Tip: Tip with splash shield Selective Debridement Selective Debridement - Location: sacrum/buttocks Selective Debridement - Tools Used: Forceps;Scissors Selective Debridement - Tissue Removed: yellow necrotic tissue   Wound Assessment and Plan  Wound Therapy - Assess/Plan/Recommendations Wound Therapy - Clinical Statement: Making progress. Some hypergranulation. Still with large amounts of yellow necrotic tissue and adipose. Continue with current plan of care. Wound Therapy - Functional Problem List: Pt with decr sitting tolerance due to sacral wound. Factors Delaying/Impairing Wound Healing: Immobility;Multiple medical problems;Polypharmacy Hydrotherapy Plan: Debridement;Dressing change;Patient/family education;Pulsatile lavage with suction Wound Therapy - Frequency: 6X / week Wound Therapy - Follow Up Recommendations: Home health RN Wound Plan: See above  Wound Therapy Goals- Improve the function of patient's integumentary system by progressing the wound(s) through the phases of wound healing (inflammation - proliferation - remodeling) by: Decrease Necrotic Tissue to: 10 Decrease Necrotic Tissue - Progress: Progressing toward goal Increase Granulation Tissue to: 90 Increase Granulation Tissue - Progress: Progressing toward goal Goals/treatment plan/discharge plan were made with and agreed upon by patient/family: Yes Time For Goal Achievement: 7 days Wound Therapy - Potential for Goals: Good  Goals will be updated until maximal potential achieved or discharge criteria met.  Discharge criteria: when goals achieved, discharge from hospital, MD decision/surgical intervention, no progress towards goals, refusal/missing three consecutive treatments without notification or medical reason.  GP     Ellouise Newer 09/07/2015, 10:22 AM Elayne Snare, [place]

## 2015-09-07 NOTE — Progress Notes (Signed)
Physical Therapy Note  Patient Details  Name: Jonathan HarpsHarold D Macho MRN: [id number] Date of Birth: 12/03/1945 Today's Date: 09/07/2015    Time: 1400-1500 60 minutes  1:1 Pt with no c/o pain initially. C/o pain near wound with tilt table, eased when out of position.  Rolling multiple times for changing clothes and placing draw sheet. Pt continues to need mod/max A to bend LEs, mod A for rolling with bed rails.  Tilt table to 70 degrees 2 x 2 minutes, taking 2-3 minutes to get to 70 degrees.  Pt encouraged by progress.  PT encouraged pt that we need to start standing without a machine soon, pt seems hesitant, but willing to participant.Pt's sister and brother in law present for treatment.   Quindarius Cabello 09/07/2015, 3:05 PM

## 2015-09-07 NOTE — Progress Notes (Signed)
Occupational Therapy Session Note  Patient Details  Name: GERHARD RAPPAPORT MRN: [id number] Date of Birth: 1946-01-23  Today's Date: 09/07/2015 OT Individual Time: 9371-6967 OT Individual Time Calculation (min): 60 min    Short Term Goals: Week 1:  OT Short Term Goal 1 (Week 1): Pt will complete bathing with mod assist seated at EOB with assist of 1 caregiver OT Short Term Goal 1 - Progress (Week 1): Not met OT Short Term Goal 2 (Week 1): Pt will complete UB dressing with mod assist and min cues for problem solving/organization OT Short Term Goal 2 - Progress (Week 1): Met OT Short Term Goal 3 (Week 1): Pt will complete LB dressing at bed level with max assist OT Short Term Goal 3 - Progress (Week 1): Not met OT Short Term Goal 4 (Week 1): Pt will complete sit > stand from EOB with +2 assist to complete 1 grooming task OT Short Term Goal 4 - Progress (Week 1): Not met Week 2:  OT Short Term Goal 1 (Week 2): Pt will complete LB dressing at bed level with max assist OT Short Term Goal 2 (Week 2): Pt will donn pullover shirt EOB with close supervision. OT Short Term Goal 3 (Week 2): Pt will perform UB bathing sitting unsupported EOB with supervision, including reaching both upper shoulders.  OT Short Term Goal 4 (Week 2): Pt will transition sidelying to sitting in order to complete seltcare tasks with mod assist 2 consecutive sessions OT Short Term Goal 5 (Week 2): Pt will perform sliding board transfer from bed to wheelchair with max assist.  Skilled Therapeutic Interventions/Progress Updates:    Pt seen this session to facilitate self care of bathing and dressing bed level with a focus on bed mobility and activity tolerance. Pt's O2 levels remained above 95 throughout the session. He was able to participate actively with only a few rest breaks, although pt is very talkative and needs to be redirected often throughout the session.  Pt was able to actively reach towards knees and roll with mod  A. Pt then engaged in BLE strengthening with heel slides on each leg focusing on pt pushing legs out to activate his quads.  Small bridges with max support to maintain feet/ knees in flexion. Pt resting in bed, wound care team arrived for his next session.  Therapy Documentation Precautions:  Precautions Precautions: Fall Precaution Comments: continuous pulse ox, trach, O2, sacral decub with increased pain Restrictions Weight Bearing Restrictions: No Other Position/Activity Restrictions: trach collar Vital Signs: Therapy Vitals Pulse Rate: (!) 107 Resp: 16 Patient Position (if appropriate): Lying Oxygen Therapy SpO2: 98 % O2 Device: Not Delivered Pain:   sacral pain with movement  ADL:  See Function Navigator for Current Functional Status.   Therapy/Group: Individual Therapy  Latron Ribas 09/07/2015, 12:29 PM

## 2015-09-07 NOTE — Progress Notes (Signed)
Patient did not need suctioning at this time.  Will continue to monitor.

## 2015-09-07 NOTE — Progress Notes (Signed)
Speech Language Pathology Daily Session Note  Patient Details  Name: Jonathan Le MRN: [id number] Date of Birth: 03/21/1946  Today's Date: 09/07/2015 SLP Individual Time: 1505-1530 SLP Individual Time Calculation (min): 25 min  Short Term Goals: Week 2: SLP Short Term Goal 1 (Week 2): STG=LTG as he is quickly approaching long term goals   Skilled Therapeutic Interventions:  Pt was seen for skilled ST targeting cognitive goals.  Pt slightly more awake but remained lethargic throughout session.  SLP facilitated the session with a semi-complex categorical naming task targeting mental flexibility for functional problem solving.  Pt required min assist verbal cues to generate specific category members for >75% accuracy.  Pt continued to need auditory and tactile stimulation to achieve adequate alertness for participation in therapies. Pt was able to remain alert for 1-2 minute intervals before requiring stim for arousal.  Pt was left in bed with family members at bedside and all needs left within reach.  Continue per current plan of care.    Function:  Eating Eating                 Cognition Comprehension Comprehension assist level: Understands basic 75 - 89% of the time/ requires cueing 10 - 24% of the time  Expression Expression assistive device: Talk trach valve Expression assist level: Expresses basic 90% of the time/requires cueing < 10% of the time.  Social Interaction Social Interaction assist level: Interacts appropriately 90% of the time - Needs monitoring or encouragement for participation or interaction.  Problem Solving Problem solving assist level: Solves basic 75 - 89% of the time/requires cueing 10 - 24% of the time  Memory Memory assist level: Recognizes or recalls 50 - 74% of the time/requires cueing 25 - 49% of the time    Pain Pain Assessment Pain Assessment: No/denies pain  Therapy/Group: Individual Therapy  Markan Cazarez, Melanee SpryNicole L 09/07/2015, 4:11 PM

## 2015-09-07 NOTE — Progress Notes (Signed)
Referring Physician(s): Derrell LollingRamirez  Chief Complaint:  Colectomy- abscess drains  Subjective:  B drains placed 07/21/15 2 new/additional L drains placed 9/29 Rt drain removed 09/01/15 CT 10/27 did show some resolving abscesses--but still good collections on Lt Pt getting better daily in Rehab Under treatment for sacral decubs All 3 drains on left are intact Output serous to white 10-20 cc/daily for each  Allergies: Unasyn; Levaquin; Penicillins; and Doxycycline  Medications: Prior to Admission medications   Medication Sig Start Date End Date Taking? Authorizing Provider  Amino Acids-Protein Hydrolys (FEEDING SUPPLEMENT, PRO-STAT SUGAR FREE 64,) LIQD Place 30 mLs into feeding tube 5 (five) times daily. 07/25/15  Yes [name], NP  white petrolatum (VASELINE) GEL Apply 1 application topically daily. Apply to L nare wound daily 07/08/15  Yes [name], NP     Vital Signs: BP 112/70 mmHg  Pulse 96  Temp(Src) 97.8 F (36.6 C) (Oral)  Resp 19  Ht 5\' 9"  (1.753 m)  Wt 248 lb 11.2 oz (112.81 kg)  BMI 36.71 kg/m2  SpO2 97%  Physical Exam  Abdominal: Soft. Bowel sounds are normal. There is no tenderness.  L drain sites clean and dry NT no bleeding Output for each is serous to white 10-20 cc each/ daily 20 cc in each JP now Wbc wnl afeb   Skin: Skin is warm and dry. Rash noted.  Pt state rash is resolving    Imaging: No results found.  Labs:  CBC:  Recent Labs  08/16/15 0701 08/20/15 0648 08/28/15 0430 09/07/15 0433  WBC 9.2 5.2 6.6 6.3  HGB 7.5* 8.8* 8.2* 9.3*  HCT 25.6* 29.3* 26.9* 31.2*  PLT 387 328 349 311    COAGS:  Recent Labs  07/21/15 0815  07/23/15 1200 07/25/15 0419 07/30/15 0627  09/04/15 0455 09/05/15 0605 09/06/15 0510 09/07/15 0433  INR 1.65*  < > 2.62* 1.48 1.35  < > 2.23* 2.55* 2.65* 2.70*  APTT 29  --  38* 28 31  --   --   --   --   --   < > = values in this interval not displayed.  BMP:  Recent Labs   08/28/15 0430 08/29/15 0502 08/31/15 0445 09/07/15 0433  NA 139 135 135 137  K 3.3* 3.6 4.0 3.5  CL 99* 97* 98* 103  CO2 33* 31 32 30  GLUCOSE 100* 99 94 95  BUN 8 9 10 9   CALCIUM 8.1* 8.0* 8.1* 8.1*  CREATININE 0.32* 0.35* 0.33* 0.37*  GFRNONAA >60 >60 >60 >60  GFRAA >60 >60 >60 >60    LIVER FUNCTION TESTS:  Recent Labs  07/23/15 0600 07/24/15 0345 07/25/15 0419  07/28/15 0630 08/02/15 0620 08/16/15 0701 08/28/15 0430  BILITOT 1.0 0.7 0.9  --   --   --   --  0.6  AST 30 40 35  --   --   --   --  22  ALT 15* 15* 14*  --   --   --   --  10*  ALKPHOS 154* 167* 199*  --   --   --   --  80  PROT 4.6* 4.3* 4.5*  --   --   --   --  5.2*  ALBUMIN <1.0* <1.0* <1.0*  < > 1.4* 1.2* 1.2* 1.6*  < > = values in this interval not displayed.  Assessment and Plan:  abd abscess ---drains in Left x 3 All have output 10-20 cc daily Last CT  10/17 Consider reCt soon----when output 10cc/daily for each  Signed: Olusegun Gerstenberger A 09/07/2015, 10:15 AM   I spent a total of 15 Minutes at the the patient's bedside AND on the patient's hospital floor or unit, greater than 50% of which was counseling/coordinating care for abscess drains

## 2015-09-07 NOTE — Progress Notes (Signed)
Patient stated that he did not need suctioning at this time.  Will continue to monitor.

## 2015-09-08 ENCOUNTER — Ambulatory Visit (HOSPITAL_COMMUNITY): Payer: Medicare Other

## 2015-09-08 ENCOUNTER — Inpatient Hospital Stay (HOSPITAL_COMMUNITY): Payer: Medicare Other

## 2015-09-08 ENCOUNTER — Inpatient Hospital Stay (HOSPITAL_COMMUNITY): Payer: Medicare Other | Admitting: Occupational Therapy

## 2015-09-08 LAB — GLUCOSE, CAPILLARY
GLUCOSE-CAPILLARY: 99 mg/dL (ref 65–99)
GLUCOSE-CAPILLARY: 103 mg/dL — AB (ref 65–99)
Glucose-Capillary: 90 mg/dL (ref 65–99)
Glucose-Capillary: 91 mg/dL (ref 65–99)
Glucose-Capillary: 115 mg/dL — ABNORMAL HIGH (ref 65–99)

## 2015-09-08 LAB — PROTIME-INR
INR: 3.04 — ABNORMAL HIGH (ref 0.00–1.49)
PROTHROMBIN TIME: 30.9 s — AB (ref 11.6–15.2)

## 2015-09-08 NOTE — Progress Notes (Signed)
Occupational Therapy Session Note  Patient Details  Name: Jonathan Le MRN: [id number] Date of Birth: 07/01/1946  Today's Date: 09/08/2015 OT Individual Time: 0800-0903 OT Individual Time Calculation (min): 63 min    Short Term Goals: Week 2:  OT Short Term Goal 1 (Week 2): Pt will complete LB dressing at bed level with max assist OT Short Term Goal 2 (Week 2): Pt will donn pullover shirt EOB with close supervision. OT Short Term Goal 3 (Week 2): Pt will perform UB bathing sitting unsupported EOB with supervision, including reaching both upper shoulders.  OT Short Term Goal 4 (Week 2): Pt will transition sidelying to sitting in order to complete seltcare tasks with mod assist 2 consecutive sessions OT Short Term Goal 5 (Week 2): Pt will perform sliding board transfer from bed to wheelchair with max assist.  Skilled Therapeutic Interventions/Progress Updates:    Pt seen for ADL session.  Pt immediately stating that he could not find his Jonathan Le Valve that he took off in the night and sat on his bedside tray.  SLP and nursing secretary in the room as well to help look for valve but did not find.  Proceeded with OT treatment with plans for SLP to order him a new one.  Pt worked on rolling in the bed for therapist to help with cleaning his buttocks with mod assist.  Pt still needing min to mod assist for bending up his LEs, but once bent up he is able to use the rails to roll to the right with supervision and then to the left with min assist.  Noticed large amount of mucusy type discharge on the sheets of his bed as well.  Surgical PA in during session as well and she feels it is likely coming from his rectum instead of the wound, which was initially thought to have been it's origination.  HOB elevated for pt to wash front peri anal area, but still needs assistance secondary to having difficulty reaching.  Session limited to in bed as pt with hydrotherapy shortly after session.  Therapist  assisted with LB bathing secondary to decreased time from MD visits and looking for valve.    Therapy Documentation Precautions:  Precautions Precautions: Fall Precaution Comments: continuous pulse ox, trach, O2, sacral decub with increased pain Restrictions Weight Bearing Restrictions: No Other Position/Activity Restrictions: trach collar  Pain: Pain Assessment Pain Assessment: No/denies pain ADL: See Function Navigator for Current Functional Status.   Therapy/Group: Individual Therapy  [name] OTR/L 09/08/2015, 11:37 AM

## 2015-09-08 NOTE — Consult Note (Signed)
  INITIAL DIAGNOSTIC EVALUATION - [facility]IDENTIAL [facility]   MEDICAL NECESSITY:  Jonathan Le was [facility] for an initial diagnostic evaluation owing to the patient's diagnosis of critical illness myopathy.   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 He required total colectomy with ileostomy by Dr. Donell BeersByerly and self extubated on 08/08 but developed respiratory distress with confusion requiring eventual intubation."   During today's visit, Jonathan Le reported that his memory is not as good as used to be and that he tends to lose his train of thought. He also feels easily distracted. With regard to his most recent medical situation, he said that he woke up quite confused post-surgery due to anesthesia effects. These issues are resolving.  From an emotional standpoint, Jonathan Le has experienced a series of unfortunate events medically including two blocked coronary arteries that almost led to a heart attack. He then suffered a blocked bowel. He has been hospitalized for quite some time. Despite this, he is adjusting well to this hospitalization. He denies any overt depression or anxiety. He said he has always been a positive person which keeps him going. He has been provided significant social support from his sister; however, his wife has not been so supportive. In fact, the patient spent a great deal of time talking about the marital discord  that he has been suffering with his wife of 15 years. He described her as quite aggressive and said that she is always "battling people." He has purportedly mentioned marital counseling to her but she is not agreeable to it. He said that she tends to ruminate about things in the past and use them against people later. Patient himself has never participated in counseling or been treated for mental health issues. Suicidal/homicidal ideation, plan or intent was denied. No manic or hypomanic episodes were reported. The patient denied ever experiencing any auditory/visual hallucinations. No major behavioral or personality changes were endorsed.   Patient feels that progress is being made in therapy. No barriers to therapy identified. As mentioned, he has ample social support from his sister but not so much from his wife. He described most of the rehab staff as "wonderful."  PROCEDURES: [1 unit 90791] Diagnostic clinical interview  Review of available records   IMPRESSION: Jonathan Le reported experiencing a few mild cognitive difficulties that he attributes to post surgery anesthesia effects. These are resolving and I do not feel there is any need to assess cognition further at this time. From an emotional standpoint, he appears to be adjusting well to this prolonged hospitalization. However, it was very clear that he has been experiencing a significant degree of marital discord for several years. I suggested to him that he may want to consider individual counseling for himself upon discharge. He was amenable. I will informally follow the patient throughout this hospitalization for supportive psychotherapy.     Debbe MountsAdam T. Frazier Balfour, Psy.D.  Clinical Neuropsychologist

## 2015-09-08 NOTE — Progress Notes (Signed)
ANTICOAGULATION CONSULT NOTE - Follow Up Consult  Pharmacy Consult for Coumadin Indication: DVT  Allergies  Allergen Reactions  . Unasyn [Ampicillin-Sulbactam Sodium] Hives, Itching and Rash    Diffuse rash not responding to Pepcid and Benadryl.  TDD.  Marland Kitchen. Levaquin [Levofloxacin] Nausea Only  . Penicillins Hives  . Doxycycline Rash    Patient Measurements: Height: 5\' 9"  (175.3 cm) Weight: 247 lb 5.7 oz (112.2 kg) IBW/kg (Calculated) : 70.7  Vital Signs: Temp: 98.9 F (37.2 C) (10/25 0450) Temp Source: Oral (10/25 0450) BP: 98/56 mmHg (10/25 0450) Pulse Rate: 99 (10/25 1250)  Labs:  Recent Labs  09/06/15 0510 09/07/15 0433 09/08/15 0550  HGB  --  9.3*  --   HCT  --  31.2*  --   PLT  --  311  --   LABPROT 27.9* 28.3* 30.9*  INR 2.65* 2.70* 3.04*  CREATININE  --  0.37*  --     Estimated Creatinine Clearance: 106.1 mL/min (by C-G formula based on Cr of 0.37).   Assessment:  Anticoag: Coumadin for recent LLE DVT. Flagyl d/c'ed 10/14, anticipate higher Coumadin doses - INR 2.70>3.04 today. Pink urine on 10/22. Hgb improved to 9.3.  Goal of Therapy:  INR 2-3 Monitor platelets by anticoagulation protocol: Yes   Plan:  - Hold Coumadin x 1 day.  - Continue daily PT / INR   Doralyn Kirkes S. Merilynn Finlandobertson, PharmD, BCPS Clinical Staff Pharmacist Pager [phone]  Misty Stanleyobertson, Shawan Corella Stillinger 09/08/2015,1:36 PM

## 2015-09-08 NOTE — Progress Notes (Signed)
Physical Therapy Wound Treatment Patient Details  Name: Jonathan Le MRN: [id number] Date of Birth: July 13, 1946  Today's Date: 09/08/2015 Time: 1594-5859 Time Calculation (min): 38 min  Subjective  Subjective: The people with surgery came to see me today. Patient and Family Stated Goals: Get better Date of Onset:  (prior to this hospitalization) Prior Treatments: I&D, dressing changes  Pain Score:   "Doing pretty good" Does not need pain medication. No numerical value given at this time.  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 Subcutaneous fat may be visible but bone, tendon or muscle are NOT exposed. (Active)  Dressing Type ABD;Barrier Film (skin prep);Gauze (Comment);Moist to dry;Tape dressing;Other (Comment) 09/08/2015 10:00 AM  Dressing Changed;Clean;Dry;Intact 09/08/2015 10:00 AM  Dressing Change Frequency Twice a day 09/08/2015 10:00 AM  State of Healing Early/partial granulation 09/08/2015 10:00 AM  Site / Wound Assessment Pink;Red;Yellow 09/08/2015 10:00 AM  % Wound base Red or Granulating 45% 09/08/2015 10:00 AM  % Wound base Yellow 55% 09/08/2015 10:00 AM  % Wound base Black 0% 09/08/2015 10:00 AM  % Wound base Other (Comment) 0% 09/08/2015 10:00 AM  Peri-wound Assessment Intact 09/08/2015 10:00 AM  Wound Length (cm) 7.9 cm 09/07/2015 10:13 AM  Wound Width (cm) 9.1 cm 09/07/2015 10:13 AM  Wound Depth (cm) 4.5 cm 09/07/2015 10:13 AM  Tunneling (cm) 5.6 at 12-1:00 o'clock 09/07/2015 10:13 AM  Margins Unattached edges (unapproximated) 09/08/2015 10:00 AM  Drainage Amount Moderate 09/08/2015 10:00 AM  Drainage Description Serosanguineous;No odor 09/08/2015 10:00 AM  Treatment Debridement (Selective);Hydrotherapy (Pulse lavage);Packing (Saline gauze);Tape changed;Other (Comment) 09/08/2015 10:00 AM     Santyl applied to wound bed prior to applying dressing.  Hydrotherapy Pulsed lavage therapy - wound location:  sacrum/buttocks Pulsed Lavage with Suction (psi): 8 psi (4-8) Pulsed Lavage with Suction - Normal Saline Used: 1000 mL Pulsed Lavage Tip: Tip with splash shield Selective Debridement Selective Debridement - Location: sacrum/buttocks Selective Debridement - Tools Used: Forceps;Scissors Selective Debridement - Tissue Removed: yellow necrotic tissue   Wound Assessment and Plan  Wound Therapy - Assess/Plan/Recommendations Wound Therapy - Clinical Statement: More necrotic tissue removed today. Healthy appearing granulation throughout perimeter with slight hypergranulation in areas. Wound Therapy - Functional Problem List: Pt with decr sitting tolerance due to sacral wound. Factors Delaying/Impairing Wound Healing: Immobility;Multiple medical problems;Polypharmacy Hydrotherapy Plan: Debridement;Dressing change;Patient/family education;Pulsatile lavage with suction Wound Therapy - Frequency: 6X / week Wound Therapy - Follow Up Recommendations: Home health RN Wound Plan: See above  Wound Therapy Goals- Improve the function of patient's integumentary system by progressing the wound(s) through the phases of wound healing (inflammation - proliferation - remodeling) by: Decrease Necrotic Tissue to: 10 Decrease Necrotic Tissue - Progress: Progressing toward goal Increase Granulation Tissue to: 90 Increase Granulation Tissue - Progress: Progressing toward goal  Goals will be updated until maximal potential achieved or discharge criteria met.  Discharge criteria: when goals achieved, discharge from hospital, MD decision/surgical intervention, no progress towards goals, refusal/missing three consecutive treatments without notification or medical reason.  GP     Ellouise Newer 09/08/2015, 10:44 AM Elayne Snare, [place]

## 2015-09-08 NOTE — Progress Notes (Signed)
Subjective/Complaints: Discussed sacral wound with surgery PA. Also discussed mucoid drainage from rectum. Overall rash improving off of Unasyn Occasional sacral pain.  ROS: +puritis.  Denies CP, SOB, n/v/d.   Objective: Vital Signs: Blood pressure 98/56, pulse 94, temperature 98.9 F (37.2 C), temperature source Oral, resp. rate 18, height _0  (1.753 m), weight 112.2 kg (247 lb 5.7 oz), SpO2 96 %. No results found. Results for orders placed or performed during the hospital encounter of 08/27/15 (from the past 72 hour(s))  Glucose, capillary     Status: Abnormal   Collection Time: 09/05/15 11:47 AM  Result Value Ref Range   Glucose-Capillary 123 (H) 65 - 99 mg/dL  Glucose, capillary     Status: Abnormal   Collection Time: 09/05/15  4:21 PM  Result Value Ref Range   Glucose-Capillary 110 (H) 65 - 99 mg/dL  Glucose, capillary     Status: Abnormal   Collection Time: 09/05/15  9:34 PM  Result Value Ref Range   Glucose-Capillary 108 (H) 65 - 99 mg/dL  Protime-INR     Status: Abnormal   Collection Time: 09/06/15  5:10 AM  Result Value Ref Range   Prothrombin Time 27.9 (H) 11.6 - 15.2 seconds   INR 2.65 (H) 0.00 - 1.49  Glucose, capillary     Status: Abnormal   Collection Time: 09/06/15  6:44 AM  Result Value Ref Range   Glucose-Capillary 124 (H) 65 - 99 mg/dL  Glucose, capillary     Status: Abnormal   Collection Time: 09/06/15 11:31 AM  Result Value Ref Range   Glucose-Capillary 106 (H) 65 - 99 mg/dL  Glucose, capillary     Status: None   Collection Time: 09/06/15  4:27 PM  Result Value Ref Range   Glucose-Capillary 98 65 - 99 mg/dL  Glucose, capillary     Status: Abnormal   Collection Time: 09/06/15  9:51 PM  Result Value Ref Range   Glucose-Capillary 151 (H) 65 - 99 mg/dL  Protime-INR     Status: Abnormal   Collection Time: 09/07/15  4:33 AM  Result Value Ref Range   Prothrombin Time 28.3 (H) 11.6 - 15.2 seconds   INR 2.70 (H) 0.00 - 1.49  CBC with  Differential/Platelet     Status: Abnormal   Collection Time: 09/07/15  4:33 AM  Result Value Ref Range   WBC 6.3 4.0 - 10.5 K/uL   RBC 3.49 (L) 4.22 - 5.81 MIL/uL   Hemoglobin 9.3 (L) 13.0 - 17.0 g/dL   HCT 31.2 (L) 39.0 - 52.0 %   MCV 89.4 78.0 - 100.0 fL   MCH 26.6 26.0 - 34.0 pg   MCHC 29.8 (L) 30.0 - 36.0 g/dL   RDW 16.4 (H) 11.5 - 15.5 %   Platelets 311 150 - 400 K/uL   Neutrophils Relative % 45 %   Lymphocytes Relative 20 %   Monocytes Relative 8 %   Eosinophils Relative 27 %   Basophils Relative 0 %   Neutro Abs 2.8 1.7 - 7.7 K/uL   Lymphs Abs 1.3 0.7 - 4.0 K/uL   Monocytes Absolute 0.5 0.1 - 1.0 K/uL   Eosinophils Absolute 1.7 (H) 0.0 - 0.7 K/uL   Basophils Absolute 0.0 0.0 - 0.1 K/uL   Smear Review MORPHOLOGY UNREMARKABLE   Basic metabolic panel     Status: Abnormal   Collection Time: 09/07/15  4:33 AM  Result Value Ref Range   Sodium 137 135 - 145 mmol/L   Potassium 3.5 3.5 -  5.1 mmol/L   Chloride 103 101 - 111 mmol/L   CO2 30 22 - 32 mmol/L   Glucose, Bld 95 65 - 99 mg/dL   BUN 9 6 - 20 mg/dL   Creatinine, Ser 0.37 (L) 0.61 - 1.24 mg/dL   Calcium 8.1 (L) 8.9 - 10.3 mg/dL   GFR calc non Af Amer >60 >60 mL/min   GFR calc Af Amer >60 >60 mL/min    Comment: (NOTE) The eGFR has been calculated using the CKD EPI equation. This calculation has not been validated in all clinical situations. eGFR's persistently <60 mL/min signify possible Chronic Kidney Disease.    Anion gap 4 (L) 5 - 15  Magnesium     Status: None   Collection Time: 09/07/15  4:33 AM  Result Value Ref Range   Magnesium 1.7 1.7 - 2.4 mg/dL  Glucose, capillary     Status: None   Collection Time: 09/07/15  7:04 AM  Result Value Ref Range   Glucose-Capillary 93 65 - 99 mg/dL  Glucose, capillary     Status: None   Collection Time: 09/07/15 11:24 AM  Result Value Ref Range   Glucose-Capillary 90 65 - 99 mg/dL  Glucose, capillary     Status: None   Collection Time: 09/07/15  4:19 PM  Result Value  Ref Range   Glucose-Capillary 93 65 - 99 mg/dL  Glucose, capillary     Status: None   Collection Time: 09/07/15 11:16 PM  Result Value Ref Range   Glucose-Capillary 99 65 - 99 mg/dL   Comment 1 Notify RN   Protime-INR     Status: Abnormal   Collection Time: 09/08/15  5:50 AM  Result Value Ref Range   Prothrombin Time 30.9 (H) 11.6 - 15.2 seconds   INR 3.04 (H) 0.00 - 1.49  Glucose, capillary     Status: None   Collection Time: 09/08/15  6:47 AM  Result Value Ref Range   Glucose-Capillary 90 65 - 99 mg/dL    Nursing note and vitals reviewed. Constitutional: He is oriented to person, place, and time.he is slower today He appears well-developed and well-nourished. No distress.  HENT:  Head: Normocephalic and atraumatic.  Eyes: Conjunctivae and EOM are normal.   Neck: Decreased range of motion present.  Trach with dry dressing in place.  Cardiovascular: Normal rate and regular rhythm. noES Respiratory: Effort normal and breath sounds normal. No respiratory distress. He has no wheezes.   Sternotomy incision is clean,dry and intact--healing well.  GI: Soft. Bowel sounds are normal.  Non-tender. Midline incision has healed . Ostomy with bilious drainage. Multiple drains LLQ sutured in place.no erythema  Genitourinary:  Foley in place.  Musculoskeletal: He exhibits edema b/l LE  Right knee with effusion. No tenderness.  Skin: Grade 4 sacral decubitus ulcer.  Various drains in place.  Not diaphoretic Right lower shin with stasis changes. Multiple ulcers on bilateral heels Diffuse macular rash Neurological: He is alert and oriented to person, place, and time.  Able to phonate well with PMSV. Speech clear. Follows basic commands without difficulty.  Psychiatric: He has a normal mood and affect. His speech is normal and behavior is normal. Cognition and memory are normal.  Strength Antigravity through, except b/l hip flexors.   Sensation diminished in feet.     Assessment/Plan: 1. 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 Physiatrist is  providing close team supervision and 24 hour management of active medical problems listed below. Physiatrist and rehab team continue to assess barriers to discharge/monitor patient progress toward functional and medical goals.  FIM: Function - Bathing Position: Bed Body parts bathed by patient: Right arm, Left arm, Chest, Abdomen, Right upper leg, Left upper leg, Front perineal area Body parts bathed by helper: Right lower leg, Left lower leg, Back Bathing not applicable: Buttocks Assist Level: 2 helpers  Function- Upper Body Dressing/Undressing What is the patient wearing?: Pull over shirt/dress Pull over shirt/dress - Perfomed by patient: Thread/unthread right sleeve, Thread/unthread left sleeve, Put head through opening Pull over shirt/dress - Perfomed by helper: Pull shirt over trunk Assist Level: 2 helpers Function - Lower Body Dressing/Undressing What is the patient wearing?: Pants, Underwear, Non-skid slipper socks Position: Bed Underwear - Performed by helper: Thread/unthread right underwear leg, Thread/unthread left underwear leg, Pull underwear up/down Pants- Performed by helper: Thread/unthread right pants leg, Thread/unthread left pants leg, Pull pants up/down Non-skid slipper socks- Performed by helper: Don/doff right sock, Don/doff left sock Assist for footwear: Dependant Assist for lower body dressing: 2 Helpers  Function - Toileting Toileting activity did not occur: Safety/medical concerns Assist level: Two helpers (per Herman, NT report)  Function - Air cabin crew transfer activity did not occur: Safety/medical concerns  Function - Chair/bed transfer Chair/bed transfer activity did not occur:  Safety/medical concerns Chair/bed transfer method: Lateral scoot Chair/bed transfer assist level: 2 helpers Chair/bed transfer assistive device: Sliding board  Function - Locomotion: Wheelchair Will patient use wheelchair at discharge?: Yes Type: Manual Wheelchair activity did not occur: Safety/medical concerns Max wheelchair distance: 40 Assist Level: Maximal assistance (Pt 25 - 49%) Wheel 50 feet with 2 turns activity did not occur: Safety/medical concerns Wheel 150 feet activity did not occur: Safety/medical concerns Function - Locomotion: Ambulation Ambulation activity did not occur: Safety/medical concerns Walk 10 feet activity did not occur: Safety/medical concerns Walk 50 feet with 2 turns activity did not occur: Safety/medical concerns Walk 150 feet activity did not occur: Safety/medical concerns Walk 10 feet on uneven surfaces activity did not occur: Safety/medical concerns  Function - Comprehension Comprehension: Auditory Comprehension assist level: Understands basic 75 - 89% of the time/ requires cueing 10 - 24% of the time  Function - Expression Expression: Verbal Expression assistive device: Talk trach valve Expression assist level: Expresses basic 90% of the time/requires cueing < 10% of the time.  Function - Social Interaction Social Interaction assist level: Interacts appropriately 90% of the time - Needs monitoring or encouragement for participation or interaction.  Function - Problem Solving Problem solving assist level: Solves basic 75 - 89% of the time/requires cueing 10 - 24% of the time  Function - Memory Memory assist level: Recognizes or recalls 50 - 74% of the time/requires cueing 25 - 49% of the time Patient normally able to recall (first 3 days only): Current season, Location of own room, Staff names and faces, That he or she is in a hospital  1. 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  2. LLE DVT treatment /Anticoagulation: Pharmaceutical: Coumadin, appreciate Pharmacy assistance.  supratherapeutic but no evidence of bleeding except for wine colored Foley 3. Pain Management: Tylenol for prn for pain. Will continue oxycodone prn if needed for severe pain. 4. Mood: LCSW to follow for evaluation  and support.  5. Neuropsych: This patient is capable of making decisions on his own behalf. 6. Skin/Wound Care:  appreciate.general surgery for sacral wound debridement, PRAFO ordered for heels, Cont air bed , Santyl collagenase for sacral decubitus. Hydrotherapy for physical therapy, will soon place wound VAC 7. Fluids/Electrolytes/Nutrition: Monitor I/O. Will consult dietician to help with food choices. Increase prosource to TID. Added ensure between meals as unable to tolerate a lot of food at one time.   - Pt eating 100% of meals.  8. Abdominal abscess: Continue drain care bid.  Flagyl to contiue till 10/26 per records. ID on consult, Pt refused contrast has some improvement of at least one abscess, s/p drain removal.due to Unasyn allergy, switched to imipenem 9. Hypokalemia: Improved. 4.0 on 10/17. Stable at 3.5 on 10/24 10. VDRF/ History of OSA: To continue current trach till mobility improves per CCM. Tolerating humidified air. We will need to reconsult pulmonary before decannulation,thus far patient not gaining much strength or endurance 11. Anxiety disorder: Will resume low dose Xanax as used this for insomnia at home. given his respiratory compromise will need to limit dosing.  Improving.  12. Anemia: Likely due to infection/chronic illness. Hgb stable at 9.3 on 10/24.  Will cont to monitor. 13. Anasarca: Improved Continue Lasix.  14. Diastolic CHF: Off amiodarone, Ace and statin at this time due to  recurrent hypotension and multiple medical issues. Monitor for signs of overload. Check daily weights. Monitor BP bid.  15.  Hypomagnesemia-labs personally reviewed. increase supplement to 852m BID, repeat Mg level on 10/25         16. Puritic rash: Unasyn discontinued with improvements. LOS (Days) 12 A FACE TO FACE EVALUATION WAS PERFORMED KIRSTEINS,ANDREW E 09/08/2015, 9:54 AM

## 2015-09-08 NOTE — Progress Notes (Signed)
Patient ID: Jonathan Le, male   DOB: 1946-10-02, 68 y.o.   MRN: [id number]    Subjective: Pt c/o clear discharge from somewhere behind him.  It is causing some irritation between his skin folds  Objective: Vital signs in last 24 hours: Temp:  [97.8 F (36.6 C)-98.9 F (37.2 C)] 98.9 F (37.2 C) (10/25 0450) Pulse Rate:  [92-107] 92 (10/25 0450) Resp:  [16-20] 18 (10/25 0450) BP: (98-105)/(56-61) 98/56 mmHg (10/25 0450) SpO2:  [96 %-98 %] 96 % (10/25 0450) Weight:  [112.2 kg (247 lb 5.7 oz)] 112.2 kg (247 lb 5.7 oz) (10/25 0450) Last BM Date: 09/06/15  Intake/Output from previous day: 10/24 0701 - 10/25 0700 In: 960 [P.O.:480; I.V.:30] Out: 2050 [Urine:1850; Stool:200] Intake/Output this shift: Total I/O In: -  Out: 125 [Stool:125]  PE: Skin: wound is cleaning up well with hydrotherapy.  Still with some fibrin at the deep base portion of the wound. Otherwise good granulation tissue is present Rectum: clear, watery, mucous discharge from rectum  Lab Results:   Recent Labs  09/07/15 0433  WBC 6.3  HGB 9.3*  HCT 31.2*  PLT 311   BMET  Recent Labs  09/07/15 0433  NA 137  K 3.5  CL 103  CO2 30  GLUCOSE 95  BUN 9  CREATININE 0.37*  CALCIUM 8.1*   PT/INR  Recent Labs  09/07/15 0433 09/08/15 0550  LABPROT 28.3* 30.9*  INR 2.70* 3.04*   CMP     Component Value Date/Time   NA 137 09/07/2015 0433   K 3.5 09/07/2015 0433   CL 103 09/07/2015 0433   CO2 30 09/07/2015 0433   GLUCOSE 95 09/07/2015 0433   BUN 9 09/07/2015 0433   CREATININE 0.37* 09/07/2015 0433   CALCIUM 8.1* 09/07/2015 0433   PROT 5.2* 08/28/2015 0430   ALBUMIN 1.6* 08/28/2015 0430   AST 22 08/28/2015 0430   ALT 10* 08/28/2015 0430   ALKPHOS 80 08/28/2015 0430   BILITOT 0.6 08/28/2015 0430   GFRNONAA >60 09/07/2015 0433   GFRAA >60 09/07/2015 0433   Lipase     Component Value Date/Time   LIPASE 15* 05/19/2015 2140       Studies/Results: No results  found.  Anti-infectives: Anti-infectives    Start     Dose/Rate Route Frequency Ordered Stop   09/06/15 1400  imipenem-cilastatin (PRIMAXIN) 500 mg in sodium chloride 0.9 % 100 mL IVPB  Status:  Discontinued     500 mg 200 mL/hr over 30 Minutes Intravenous 3 times per day 09/06/15 1110 09/06/15 1121   09/06/15 1200  imipenem-cilastatin (PRIMAXIN) 500 mg in sodium chloride 0.9 % 100 mL IVPB     500 mg 200 mL/hr over 30 Minutes Intravenous 4 times per day 09/06/15 1121     09/01/15 1800  Ampicillin-Sulbactam (UNASYN) 3 g in sodium chloride 0.9 % 100 mL IVPB  Status:  Discontinued     3 g 100 mL/hr over 60 Minutes Intravenous Every 6 hours 09/01/15 1539 09/06/15 1105   08/27/15 2200  meropenem (MERREM) 1 g in sodium chloride 0.9 % 100 mL IVPB  Status:  Discontinued     1 g 200 mL/hr over 30 Minutes Intravenous 3 times per day 08/27/15 1658 09/01/15 1436   08/27/15 1615  metroNIDAZOLE (FLAGYL) tablet 500 mg  Status:  Discontinued     500 mg Oral 3 times per day 08/27/15 1610 08/28/15 1651       Assessment/Plan  1. Stage 4 sacral decub ulcer -cont  BID dressing changes, santyl, and hydrotherapy.  If continues to progress well and the fibrin at the base can be debrided off, then hopefully he will be a candidate for a VAC soon -d/w the patient that the discharge he is experiencing is the mucous that is present in the defunctionalized portion of the rectum.  There is no obvious serous drainage from his wound.  The mucous is visible on exam.  I have spoken the patient and the rehab team about using powder to keep him as dry as possible in his creases to minimize irritation.   LOS: 12 days    Jonathan Le 09/08/2015, 8:42 AM Pager: [phone]

## 2015-09-08 NOTE — Progress Notes (Signed)
Physical Therapy Session Note  Patient Details  Name: Jonathan Le MRN: [id number] Date of Birth: 07/02/1946  Today's Date: 09/08/2015 PT Individual Time: 1030-1200; [id number] PT Individual Time Calculation (min): 90 min , 30 min  Short Term Goals: Week 2:  PT Short Term Goal 1 (Week 2): Pt will perform w/c propulsion 50' with mod A PT Short Term Goal 2 (Week 2): Pt will sit EOB x 10 minutes with supervision for functional UE task PT Short Term Goal 3 (Week 2): Pt will transfer bed <> chair consistently with max A of 2  Skilled Therapeutic Interventions/Progress Updates:  O2 sats on room air remained above 94% throughout session. Tx 1:  PT wrapped bil knees with ACE wraps to decreased pressure on knees during transfers.  Bed mobility for PT and helper to don brief with pt in supine. Session focused on use of SaraPlus for bed> w/c transfer, and neuromuscular re-education via visual feedback, VCs, demo for R/L hip abduction/adduction to facilitate bed mobility, and bil knee extension with isometric hold at end range, and ankle PF/DF, in sitting, to facilitate weight bearing.  SaraPlus attempted multiple times, with harness, for sit>stand.  Pt is unable to assist with UEs well enough to get COG over feet, so SaraPlus simply lifts him resulting in trunk telescoping, and low back pain.   Pt upright for greater than 1 hours before transfer to w/c.  Har[facility] for [facility] placed under pt in sitting EOB, and pt transferred using this. Pt transferred to w/c, and 2 pillows placed behind back to decrease pressure over sacrum.  Pt rated R buttock pain 8/10.  PT instructed pt in paced breathing to reduce anxiety and pain.  PT informed NT that pt may need to get back in bed after lunch.  Tx 2:  Pt reported he sat up in w/c x 1.5 hours. Pain bottom rated 7/10.  neuromuscular re-education via forced use, manual cues, VCs for R/L heel slides on MaxiSlide on bed; mass extension against resistance; bil hip  internal rotation to neutral, ankle PF/DF with hips in neutral, modified abdominal crunch, modified abdominal crunch with bil shoulder protraction, x10 x 1 each  Pt left resting in bed with all needs within reach, bil LEs elevated for edema control.  Family present.    Therapy Documentation Precautions:  Precautions Precautions: Fall Precaution Comments: continuous pulse ox, trach, O2, sacral decub with increased pain Restrictions Weight Bearing Restrictions: No Other Position/Activity Restrictions: trach collar General:   Vital Signs: Therapy Vitals Pulse Rate: 94 Resp: 18 Patient Position (if appropriate): Lying Oxygen Therapy O2 Device: Not Delivered Pain:   Mobility:   Locomotion :    Trunk/Postural Assessment :    Balance:   Exercises:   Other Treatments:     See Function Navigator for Current Functional Status.   Therapy/Group: Individual Therapy  Renesme Kerrigan 09/08/2015, 12:23 PM

## 2015-09-09 ENCOUNTER — Inpatient Hospital Stay (HOSPITAL_COMMUNITY): Payer: Medicare Other | Admitting: Speech Pathology

## 2015-09-09 ENCOUNTER — Ambulatory Visit (HOSPITAL_COMMUNITY): Payer: Medicare Other

## 2015-09-09 ENCOUNTER — Inpatient Hospital Stay (HOSPITAL_COMMUNITY): Payer: Medicare Other | Admitting: Occupational Therapy

## 2015-09-09 ENCOUNTER — Inpatient Hospital Stay (HOSPITAL_COMMUNITY): Payer: Medicare Other

## 2015-09-09 LAB — PROTIME-INR
INR: 3.12 — ABNORMAL HIGH (ref 0.00–1.49)
PROTHROMBIN TIME: 31.5 s — AB (ref 11.6–15.2)

## 2015-09-09 LAB — GLUCOSE, CAPILLARY
GLUCOSE-CAPILLARY: 96 mg/dL (ref 65–99)
GLUCOSE-CAPILLARY: 123 mg/dL — AB (ref 65–99)
Glucose-Capillary: 118 mg/dL — ABNORMAL HIGH (ref 65–99)
Glucose-Capillary: 103 mg/dL — ABNORMAL HIGH (ref 65–99)

## 2015-09-09 NOTE — Progress Notes (Signed)
Social Work Patient ID: Jonathan Le, male   DOB: [birth year], 69 y.o.   MRN: [id number] Met with pt and sister to discuss team conference progress toward his goals and a realistic discharge plan.  Pt is into his lifetime Medicare days, the concern is once these are exhausted he will have no coverage until he is out of a facility for 60 days.  Wife wants him walking before coming home, so home at this point is not an option.  Pt became tearful discussing this and wondering what he will have to do.  Discussed another LTACH versus going to a NH.  He has many medical care needs which may limit his options of who can provide the care he requires.  Will begin looking into discharge options for pt. He does want to discuss with his wife first and this worker will also contact wife since she has not been here since pt came to [facility].

## 2015-09-09 NOTE — Progress Notes (Signed)
Social Work Lucy Chrisebecca G Chianti Goh, LCSW Social Worker Signed  Patient Care Conference 09/09/2015  1:58 PM    Expand All Collapse All   Inpatient RehabilitationTeam Conference and Plan of Care Update Date: 09/09/2015   Time: 10:50 AM     Patient Name: Jonathan Le       Medical Record Number: [id number]  Date of Birth: 06/29/1946 Sex: Male         Room/Bed: 4W07C/4W07C-01 Payor Info: Payor: MEDICARE / Plan: MEDICARE PART A AND B / Product Type: *No Product type* /    Admitting Diagnosis: debility trach   Admit Date/Time:  08/27/2015  3:37 PM Admission Comments: No comment available   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  Abscess of abdominal cavity (HCC)     .  Enterococcal infection     .  Sacral decubitus ulcer     .  Hypoalbuminemia due to protein-calorie malnutrition (HCC)     .  Critical illness myopathy  08/29/2015   .  Critical illness neuropathy (HCC)  08/29/2015   .  Intra-abdominal abscess (HCC)     .  Debility  08/27/2015   .  Acute on chronic respiratory failure, unspecified whether with hypoxia or hypercapnia (HCC)     .  Encounter for orogastric (OG) tube placement     .  Abdominal abscess (HCC)     .  Abscess     .  Encounter for central line placement     .  Encounter for intubation     .  Respiratory failure (HCC)     .  Septic shock (HCC)     .  Blood poisoning (HCC)     .  Tracheostomy status (HCC)     .  Acute respiratory failure (HCC)     .  Pressure ulcer  06/21/2015   .  Shock (HCC)  06/18/2015   .  Abdominal pain, generalized     .  Shortness of breath     .  Hypoxia     .  S/P CABG x 3  05/22/2015   .  Cardiomyopathy, ischemic  05/21/2015   .  CAD, multiple vessel  05/21/2015   .  Acute systolic congestive heart failure, NYHA class 3 (HCC)  05/21/2015   .  Morbid obesity (HCC)  05/20/2015   .  OSA on CPAP  05/20/2015   .  Other  specified hypotension     .  NSTEMI (non-ST elevated myocardial infarction) (HCC)  05/19/2015   .  Benign prostatic hyperplasia with urinary obstruction  04/24/2015   .  Chronic LBP  12/05/2013   .  Essential hypertension  09/04/2013     Expected Discharge Date: Expected Discharge Date: 09/24/15  Team Members Present: Physician leading conference: Dr. Claudette LawsAndrew Kirsteins Social Worker Present: Dossie DerBecky Brain Honeycutt, LCSW PT Present: Wanda Plumparoline Cook, PT OT Present: Perrin MalteseJames McGuire, OT SLP Present: Jackalyn LombardNicole Page, SLP PPS Coordinator present : Tora DuckMarie Noel, RN, CRRN        Current Status/Progress  Goal  Weekly Team Focus   Medical     maximove transfers unchanged, some increased movement in LE, severe trunkal weakness  On e person assist mobility  ongoing wound care   Bowel/Bladder     Foley catheter,ileostomy with out complicacions.  To keep ileostomy working properly with mod. assisst.   Assess ostomy  and foley cath. Q shift to prevent complicacions.   Swallow/Nutrition/ Hydration       na         ADL's     Min assist for UB bathing and dressing, max assist for LB sit to supine.  Pt still not able to tolerate standing for LB selfcare or transfers.  Uses AE for LB selfcare tasks as well.  BUE strength and shoulder weakness.  tolerating sitting much better than last week as well as OOB.  Still with slow progress however  overall min to mod assist  selfcare re-training, transfer training, pt/family education, balance re-training, increasing OOB tolerance.      Mobility     mod assist bed mobility; +2 for mechanical lift or MaxiSlide in supine to tilt tabel; tolerance of 70 degrees on tilt table; sitting tolerance in w/c x 1.5 hours  supervision bed mobility; mod assist basic and car transfers, mod assist gait x 50' with therapists only; supervision w/c propulsion x 150' controlled, 75' home env, and min assist  x 200' community setting  bed mobility, pre-gait, transfers, tolerance of upright sitting EOB or  in w/c, family ed   Communication     Tolerating PMSV during all waking hours, can donn/doff valve with supervision-mod I, education is complete, recommend d/c speaking valve goals          Safety/Cognition/ Behavioral Observations    Consistently improved mentation, improved recall for daily information, improved functional problem solving, improved attention to tasks and awareness of deficits, quickly approaching LTG  supervision   completion of education and potential discharge from ST services by the end of this week    Pain     No complain of pain.  To keep pain levels less than 3. On scale 1 to 10.  To assess for pain levels Q 2-3 hrs.    Skin     Multiple wounds:unstagable to heel and sacrum with hydrotherapy. Midline incision with daily dressing change.  No further skin breakdown or infection with max. assisst.   Assess skin Q shift and PRN.Continue dressing changes as per orders.      *See Care Plan and progress notes for long and short-term goals.    Barriers to Discharge:  severe mobility deficits     Possible Resolutions to Barriers:   Will need another facility post D/C      Discharge Planning/Teaching Needs:   Pt making good progress but would need to be walking before wife would take him home. Sister here daily but not able to provide care to pt.         Team Discussion:    Making very slow progress and no FIM change. Sacral wound better-with daily hydro therapy. PT using tilt table for standing exercise. Severe neuropathy limits pt in his therapies. Needs to be encouraged to participate and stay out fo bed .   Revisions to Treatment Plan:    Probable LTACH versus NHP    Continued Need for Acute Rehabilitation Level of Care: The patient requires daily medical management by a physician with specialized training in physical medicine and rehabilitation for the following conditions: Daily direction of a multidisciplinary physical rehabilitation program to ensure safe  treatment while eliciting the highest outcome that is of practical value to the patient.: Yes Daily medical management of patient stability for increased activity during participation in an intensive rehabilitation regime.: Yes Daily analysis of laboratory values and/or radiology reports with any subsequent need for medication adjustment of  medical intervention for : Neurological problems;Post surgical problems;Pulmonary problems  [name] 09/09/2015, 1:58 PM                 [name], LCSW Social Worker Signed  Patient Care Conference 09/02/2015  1:11 PM    Expand All Collapse All   Inpatient RehabilitationTeam Conference and Plan of Care Update Date: 09/02/2015   Time: 10:45 AM     Patient Name: Jonathan Le       Medical Record Number: [id number]  Date of Birth: Nov 15, 1945 Sex: Male         Room/Bed: 4W07C/4W07C-01 Payor Info: Payor: MEDICARE / Plan: MEDICARE PART A AND B / Product Type: *No Product type* /    Admitting Diagnosis: debility trach   Admit Date/Time:  08/27/2015  3:37 PM Admission Comments: No comment available   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  Abscess of abdominal cavity (HCC)     .  Enterococcal infection     .  Sacral decubitus ulcer     .  Hypoalbuminemia due to protein-calorie malnutrition (HCC)     .  Critical illness myopathy  08/29/2015   .  Critical illness neuropathy (HCC)  08/29/2015   .  Intra-abdominal abscess (HCC)     .  Debility  08/27/2015   .  Acute on chronic respiratory failure, unspecified whether with hypoxia or hypercapnia (HCC)     .  Encounter for orogastric (OG) tube placement     .  Abdominal abscess (HCC)     .  Abscess     .  Encounter for central line placement     .  Encounter for intubation     .  Respiratory failure (HCC)     .  Septic shock (HCC)     .  Blood poisoning (HCC)     .   Tracheostomy status (HCC)     .  Acute respiratory failure (HCC)     .  Pressure ulcer  06/21/2015   .  Shock (HCC)  06/18/2015   .  Abdominal pain, generalized     .  Shortness of breath     .  Hypoxia     .  S/P CABG x 3  05/22/2015   .  Cardiomyopathy, ischemic  05/21/2015   .  CAD, multiple vessel  05/21/2015   .  Acute systolic congestive heart failure, NYHA class 3 (HCC)  05/21/2015   .  Morbid obesity (HCC)  05/20/2015   .  OSA on CPAP  05/20/2015   .  Other specified hypotension     .  NSTEMI (non-ST elevated myocardial infarction) (HCC)  05/19/2015   .  Benign prostatic hyperplasia with urinary obstruction  04/24/2015   .  Chronic LBP  12/05/2013   .  Essential hypertension  09/04/2013     Expected Discharge Date: Expected Discharge Date: 09/24/15  Team Members Present: Physician leading conference: Dr. Claudette Laws Social Worker Present: Dossie Der, LCSW Nurse Present: Chana Bode, RN PT Present: Other (comment) Christena Deem) OT Present: Perrin Maltese, OT SLP Present: Jackalyn Lombard, SLP PPS Coordinator present : Tora Duck, RN, CRRN        Current Status/Progress  Goal  Weekly Team Focus   Medical     Anxiety with movement. Severe lower extremity and moderately severe upper  extremity weakness due to critical illness mmyopathy and neuropathy   One-person assist  Increase activity tolerance  including upright, tilted    Bowel/Bladder     Foley- unstageable wound to sacrum; ileostomy- good output   Total assist  Assess ostomy for potential problems    Swallow/Nutrition/ Hydration       na         ADL's     min assist for UB bathing, mod assist for UB dressing.  He needs mod assist for rolling in bed during LB selfcare with total assist for LB bathing and dressing.  Max to total assist for supine to sit EOB.  Have not attempted OOB activity secondary to pa having too much pain in his buttocks in sitting.    overall min to mod assist  selfcare  re-training, transfer training, pt/family education, balance re-training, increasing OOB tolerance   Mobility     +2 for bed mobilty, lateral slide transfer on MaxiSlide to tilt table; tolerance x 1 minute of 30 degrees upright on tilt table   supervision bed mobility; mod assist basic and car transfers, mod assist gait x 50' with therapists only; supervision w/c propulsion x 150' controlled, 75' home env, and min assist  x 200' community setting  bed mobilty, pre gait, transfers, tolerance of upright position on tilt table, pt ed, tolerance of sitting upright in w/c   Communication     tolerating [place] speaking valve during all waking hours but needs cues for recall of care and cleaning of valve, donning and doffing   mod I   continue education    Safety/Cognition/ Behavioral Observations    fluctuates, varies between supervision to max assist depending on medication effects and lethargy   supervision   continue to address basic to semi-complex problem solving     Pain     Denies pain         Skin     Multiple wounds; unstageable to heel, sacrum-hydrotherapy and santyl in use; midline incision to abdomen with hydrogel   No further skin breakdown or infection with max assist.   Assess skin q shift and prn; perform dressing changes per MD order      *See Care Plan and progress notes for long and short-term goals.    Barriers to Discharge:  Patient fears  movement complaints of pain with movement in multiple areas     Possible Resolutions to Barriers:   Voltaren gel for knee pain     Discharge Planning/Teaching Needs:   Discharge plan is dependent upon pt's progress here, wife can not do much physical care for pt. Sister is supportive but can not assist. Probable plan is NHP       Team Discussion:    Goals-min/mod level, extremely deconditioned-difficulty sitting up. Daily hydrotherapy for sacral wound. Pulmonary following needs to be stronger before discharging trach. Knee pain  limits him. Activity tolerance poor working up this. Would benefit from neuro-psych seeing. Tilt-table tried pt didn't like.   Revisions to Treatment Plan:    None    Continued Need for Acute Rehabilitation Level of Care: The patient requires daily medical management by a physician with specialized training in physical medicine and rehabilitation for the following conditions: Daily direction of a multidisciplinary physical rehabilitation program to ensure safe treatment while eliciting the highest outcome that is of practical value to the patient.: Yes Daily medical management of patient stability for increased activity during participation in an intensive rehabilitation regime.: Yes  Daily analysis of laboratory values and/or radiology reports with any subsequent need for medication adjustment of medical intervention for : Neurological problems;Other;Post surgical problems  [name] 09/02/2015, 1:11 PM                  Patient ID: Jonathan Le, male   DOB: 01-10-1946, 69 y.o.   MRN: [id number]

## 2015-09-09 NOTE — Progress Notes (Signed)
ANTICOAGULATION CONSULT NOTE - Follow Up Consult  Pharmacy Consult for Coumadin Indication: DVT  Allergies  Allergen Reactions  . Unasyn [Ampicillin-Sulbactam Sodium] Hives, Itching and Rash    Diffuse rash not responding to Pepcid and Benadryl.  TDD.  Marland Kitchen. Levaquin [Levofloxacin] Nausea Only  . Penicillins Hives  . Doxycycline Rash    Patient Measurements: Height: 5\' 9"  (175.3 cm) Weight: 247 lb 5.7 oz (112.2 kg) IBW/kg (Calculated) : 70.7 Heparin Dosing Weight:   Vital Signs: Temp: 98.5 F (36.9 C) (10/26 0658) Temp Source: Oral (10/26 0658) BP: 108/55 mmHg (10/26 0658) Pulse Rate: 95 (10/26 1255)  Labs:  Recent Labs  09/07/15 0433 09/08/15 0550 09/09/15 0500  HGB 9.3*  --   --   HCT 31.2*  --   --   PLT 311  --   --   LABPROT 28.3* 30.9* 31.5*  INR 2.70* 3.04* 3.12*  CREATININE 0.37*  --   --     Estimated Creatinine Clearance: 106.1 mL/min (by C-G formula based on Cr of 0.37).   Medications:  Scheduled:  . antiseptic oral rinse  15 mL Mouth Rinse TID PC  . collagenase   Topical BID  . diclofenac sodium  2 g Topical QID  . famotidine  20 mg Oral BID  . feeding supplement (ENSURE ENLIVE)  237 mL Oral Q24H  . feeding supplement (PRO-STAT SUGAR FREE 64)  30 mL Oral TID  . furosemide  60 mg Oral Daily  . hydrocerin   Topical BID  . imipenem-cilastatin  500 mg Intravenous 4 times per day  . insulin aspart  0-5 Units Subcutaneous QHS  . insulin aspart  0-9 Units Subcutaneous TID WC  . magnesium oxide  800 mg Oral BID  . multivitamin with minerals  1 tablet Oral Daily  . potassium chloride  20 mEq Oral TID  . sodium chloride  10 mL Intravenous Q12H  . vitamin C  500 mg Oral BID  . Warfarin - Pharmacist Dosing Inpatient   Does not apply q1800    Assessment: 69yo male with DVT.  INR has been creeping up, and is up again today with no Coumadin on 10/25.  No bleeding problems noted, though pink urine on 10/22.  Pt has also been receiving scheduled Benadryl  for rash, which had previously been noted as improving with change in antibiotics per MD note.  Per d/w RN, she can no longer see rash and pt has been refusing Benadryl.  Goal of Therapy:  INR 2-3 Monitor platelets by anticoagulation protocol: Yes   Plan:  Hold Coumadin today Continue daily INR D/C Benadryl, per d/w D.Anguilli PA Watch for s/s of bleeding  Marisue HumbleKendra Francee Setzer, PharmD Clinical Pharmacist [facility]

## 2015-09-09 NOTE — Progress Notes (Signed)
Physical Therapy Wound Treatment Patient Details  Name: Jonathan Le MRN: [id number] Date of Birth: 02-23-1946  Today'Le Date: 09/09/2015 Time: 0912-0956 Time Calculation (min): 44 min  Subjective  Subjective: I leaked a lot last night. Patient and Family Stated Goals: Get better Date of Onset:  (prior to this hospitalization) Prior Treatments: I&D, dressing changes  Pain Score:   "A little pain around the edge." no numerical value given.  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 Subcutaneous fat may be visible but bone, tendon or muscle are NOT exposed. (Active)  Dressing Type ABD;Barrier Film (skin prep);Gauze (Comment);Moist to dry;Tape dressing;Other (Comment) 09/09/2015  9:59 AM  Dressing Changed;Clean;Dry;Intact 09/09/2015  9:59 AM  Dressing Change Frequency Twice a day 09/09/2015  9:59 AM  State of Healing Early/partial granulation 09/09/2015  9:59 AM  Site / Wound Assessment Pink;Red;Yellow 09/09/2015  9:59 AM  % Wound base Red or Granulating 45% 09/09/2015  9:59 AM  % Wound base Yellow 55% 09/09/2015  9:59 AM  % Wound base Black 0% 09/09/2015  9:59 AM  % Wound base Other (Comment) 0% 09/09/2015  9:59 AM  Peri-wound Assessment Intact 09/09/2015  9:59 AM  Wound Length (cm) 7.9 cm 09/07/2015 10:13 AM  Wound Width (cm) 9.1 cm 09/07/2015 10:13 AM  Wound Depth (cm) 4.5 cm 09/07/2015 10:13 AM  Tunneling (cm) 5.6 at 12-1:00 o'clock 09/07/2015 10:13 AM  Margins Unattached edges (unapproximated) 09/09/2015  9:59 AM  Drainage Amount Copious 09/09/2015  9:59 AM  Drainage Description Serosanguineous;No odor 09/09/2015  9:59 AM  Treatment Debridement (Selective);Cleansed;Hydrotherapy (Pulse lavage);Off loading;Packing (Saline gauze);Tape changed;Other (Comment) 09/09/2015  9:59 AM     Santyl applied to wound bed prior to applying dressing.  Hydrotherapy Pulsed lavage therapy - wound location: sacrum/buttocks Pulsed Lavage with Suction  (psi): 8 psi (4-8) Pulsed Lavage with Suction - Normal Saline Used: 1000 mL Pulsed Lavage Tip: Tip with splash shield Selective Debridement Selective Debridement - Location: sacrum/buttocks Selective Debridement - Tools Used: Forceps;Scissors Selective Debridement - Tissue Removed: yellow necrotic tissue   Wound Assessment and Plan  Wound Therapy - Assess/Plan/Recommendations Wound Therapy - Clinical Statement: Granulatory tissue with good vascularization present in areas under slough, adipose, and fibrin. Continue with selective debridement and enzymatic debridement to promote healthy granulation for progression of this wound.  Wound Therapy - Functional Problem List: Pt with decr sitting tolerance due to sacral wound. Factors Delaying/Impairing Wound Healing: Immobility;Multiple medical problems;Polypharmacy Hydrotherapy Plan: Debridement;Dressing change;Patient/family education;Pulsatile lavage with suction Wound Therapy - Frequency: 6X / week Wound Therapy - Follow Up Recommendations: Home health RN Wound Plan: See above  Wound Therapy Goals- Improve the function of patient'Le integumentary system by progressing the wound(Le) through the phases of wound healing (inflammation - proliferation - remodeling) by: Decrease Necrotic Tissue to: 10 Decrease Necrotic Tissue - Progress: Progressing toward goal Increase Granulation Tissue to: 90 Increase Granulation Tissue - Progress: Progressing toward goal  Goals will be updated until maximal potential achieved or discharge criteria met.  Discharge criteria: when goals achieved, discharge from hospital, MD decision/surgical intervention, no progress towards goals, refusal/missing three consecutive treatments without notification or medical reason.  GP     Jonathan Le 09/09/2015, 10:07 AM Jonathan Le, Jonathan Le

## 2015-09-09 NOTE — Progress Notes (Signed)
Nutrition Follow-up  DOCUMENTATION CODES:   Obesity unspecified  INTERVENTION:   Continue Ensure Enlive po once daily, each supplement provides 350 kcal and 20 grams of protein.  Continue 30 ml Prostat po TID, each supplement provides 100 kcal and 15 grams of protein.   Encourage adequate PO intake.   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 He required total colectomy with ileostomy Abdominal incision closing in well and multiple ulcers--bilateral heels, left thigh, and trach sites clean and dry. Stage III Sacrococcygeal wound -debrided at bedside on 10/05 and currently reported to be 15 X 15 with 1.8 cm deep .  Meal completion has been 35-100% with most po intake at 100%. Pt has been consuming his Ensure and Prostat supplements. RD to continue with current orders to aid in wound healing.   Diet Order:  Diet regular Room service appropriate?: Yes; Fluid consistency:: Thin; Fluid restriction:: 1500 mL Fluid  Skin:  Wound (see comment) (Unstageable ulcer on L heel, R coccyx, stage III on buttocks)  Last BM:  10/26 ileostomy  Height:   Ht Readings from Last 1 Encounters:  08/27/15 '5\' 9"'  (1.753 m)    Weight:   Wt Readings from Last 1 Encounters:  09/08/15 247 lb 5.7 oz (112.2 kg)    Ideal Body Weight:  72.7 kg  BMI:  Body mass index is 36.51 kg/(m^2).  Estimated Nutritional Needs:   Kcal:  2200-2400  Protein:   135-150 grams  Fluid:  2.2 - 2.4 L/day  EDUCATION NEEDS:   No education needs identified at this time  Corrin Parker, MS, RD, LDN Pager # [phone] After hours/ weekend pager # [phone]

## 2015-09-09 NOTE — Patient Care Conference (Signed)
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 Abscess of abdominal cavity (HCC)   . Enterococcal infection   . Sacral decubitus ulcer   . Hypoalbuminemia due to protein-calorie malnutrition (HCC)   . 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 Intra-abdominal abscess (HCC)   . Debility 08/27/2015  . Acute on chronic respiratory failure, unspecified whether with hypoxia or hypercapnia (HCC)   . Encounter for orogastric (OG) tube placement   . Abdominal abscess (HCC)   . Abscess   . Encounter for central line placement   . Encounter for intubation   . Respiratory failure (HCC)   . Septic shock (HCC)   . Blood poisoning (HCC)   . Tracheostomy status (HCC)   . Acute respiratory failure (HCC)   . Pressure ulcer 06/21/2015  . Shock (HCC) 06/18/2015  . Abdominal pain, generalized   . Shortness of breath   . Hypoxia   . 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 Cardiomyopathy, ischemic 05/21/2015  . CAD, multiple vessel 05/21/2015  . Acute systolic congestive heart failure, NYHA class 3 (HCC) 05/21/2015  . Morbid obesity (HCC) 05/20/2015  . OSA on CPAP 05/20/2015  . Other specified hypotension   . NSTEMI (non-ST elevated myocardial infarction) (HCC) 05/19/2015  . Benign prostatic hyperplasia with urinary obstruction 04/24/2015  . Chronic LBP 12/05/2013  . Essential hypertension 09/04/2013     Expected Discharge Date: Expected Discharge Date: 09/24/15  Team Members Present: Physician leading conference: Dr. Claudette LawsAndrew Kirsteins Social Worker Present: Dossie DerBecky Zelta Enfield, LCSW PT Present: Wanda Plumparoline Cook, PT OT Present: Perrin MalteseJames McGuire, OT SLP Present: Jackalyn LombardNicole Page, SLP PPS Coordinator present : Tora DuckMarie Noel, RN, CRRN     Current Status/Progress Goal Weekly Team Focus  Medical   maximove transfers unchanged, some increased movement in LE, severe trunkal weakness  On e person assist mobility  ongoing wound care   Bowel/Bladder   Foley catheter,ileostomy with out complicacions.  To keep ileostomy working properly with mod. assisst.  Assess ostomy and foley cath. Q shift to prevent complicacions.   Swallow/Nutrition/ Hydration     na        ADL's   Min assist for UB bathing and dressing, max assist for LB sit to supine.  Pt still not able to tolerate standing for LB selfcare or transfers.  Uses AE for LB selfcare tasks as well.  BUE strength and shoulder weakness.  tolerating sitting much better than last week as well as OOB.  Still with slow progress however  overall min to mod assist  selfcare re-training, transfer training, pt/family education, balance re-training, increasing OOB tolerance.     Mobility   mod assist bed mobility; +2 for mechanical lift or MaxiSlide in supine to tilt tabel; tolerance of 70 degrees on tilt table; sitting tolerance in w/c x 1.5 hours  supervision bed mobility; mod assist basic and car  transfers, mod assist gait x 50' with therapists only; supervision w/c propulsion x 150' controlled, 75' home env, and min assist  x 200' community setting  bed mobility, pre-gait, transfers, tolerance of upright sitting EOB or in w/c, family ed   Communication   Tolerating PMSV during all waking hours, can donn/doff valve with supervision-mod I, education is complete, recommend d/c speaking valve goals          Safety/Cognition/ Behavioral Observations  Consistently improved  mentation, improved recall for daily information, improved functional problem solving, improved attention to tasks and awareness of deficits, quickly approaching LTG  supervision   completion of education and potential discharge from ST services by the end of this week    Pain   No complain of pain.  To keep pain levels less than 3. On scale 1 to 10.  To assess for pain levels Q 2-3 hrs.   Skin   Multiple wounds:unstagable to heel and sacrum with hydrotherapy. Midline incision with daily dressing change.  No further skin breakdown or infection with max. assisst.  Assess skin Q shift and PRN.Continue dressing changes as per orders.      *See Care Plan and progress notes for long and short-term goals.  Barriers to Discharge: severe mobility deficits    Possible Resolutions to Barriers:  Will need another facility post D/C    Discharge Planning/Teaching Needs:  Pt making good progress but would need to be walking before wife would take him home. Sister here daily but not able to provide care to pt.       Team Discussion:  Making very slow progress and no FIM change. Sacral wound better-with daily hydro therapy. PT using tilt table for standing exercise. Severe neuropathy limits pt in his therapies. Needs to be encouraged to participate and stay out fo bed .  Revisions to Treatment Plan:  Probable LTACH versus NHP   Continued Need for Acute Rehabilitation Level of Care: The patient requires daily medical management by a physician with specialized training in physical medicine and rehabilitation for the following conditions: Daily direction of a multidisciplinary physical rehabilitation program to ensure safe treatment while eliciting the highest outcome that is of practical value to the patient.: Yes Daily medical management of patient stability for increased activity during participation in an intensive rehabilitation regime.: Yes Daily analysis of laboratory values and/or radiology reports  with any subsequent need for medication adjustment of medical intervention for : Neurological problems;Post surgical problems;Pulmonary problems  Jonathan Le, Lemar Livings 09/09/2015, 1:58 PM

## 2015-09-09 NOTE — Progress Notes (Signed)
Physical Therapy Session Note  Patient Details  Name: Jonathan Le MRN: [id number] Date of Birth: [birth year]  Today's Date: 09/09/2015 PT Individual Time: 1105-1210 PT Individual Time Calculation (min): 65 min   Short Term Goals: PT Short Term Goal 1 - Progress (Week 1): Met Week 2:  PT Short Term Goal 1 (Week 2): Pt will perform w/c propulsion 50' with mod A PT Short Term Goal 2 (Week 2): Pt will sit EOB x 10 minutes with supervision for functional UE task PT Short Term Goal 3 (Week 2): Pt will transfer bed <> chair consistently with max A of 2  Skilled Therapeutic Interventions/Progress Updates:  PT wrapped bil LEs foot> above knees for edema management and pain control bil knees due to OA.  Pt and family educated in basic strengthening/neuro re-ed for LEs, to be performed in supine in bed.  Hand out provided. When up on tilt table, pt performed R/L shoulder flexion 10 x 1 .  Pt performed rolling L><R for placement of Maxislide fabric under him, and to don shirt with assistance. Pt performed lateral "shimmy" of upper trunk, lower trunk and R/L hip abd/adduction in order to assist with transfer bed> tilt table.  +2 assist to complete transfer to tilt table.  Pt tolerated tilt table x 25 minutes: 5 minutes at 15 degrees, 5 min at 20 degrees, 15 min at 50 degrees.  bil knees noted to be buckling at 50 degrees; pt unable to extend knees with multi -modal cues.   PT consulted RN; pt has Voltaren PRN, but refused it this AM when supine in bed.  Pt has learned helplessness regarding predicting when he will need Voltaren.    Pt left resting in bed; sister [name] present.  She will remove ACE wraps.    Therapy Documentation Precautions:  Precautions Precautions: Fall Precaution Comments: continuous pulse ox, trach, O2, sacral decub with increased pain Restrictions Weight Bearing Restrictions: No Other Position/Activity Restrictions: trach collar Pain: Pain Assessment Pain  Assessment: 8/10 R knee when up on tilt table; relieved with non wt bearing.    See Function Navigator for Current Functional Status.   Therapy/Group: Individual Therapy  Analiese Krupka 09/09/2015, 12:20 PM

## 2015-09-09 NOTE — Progress Notes (Signed)
Subjective/Complaints: Pt without new issues overnite.  No further sacral pain reported  ROS: +puritis.  Denies CP, SOB, n/v/d.   Objective: Vital Signs: Blood pressure 108/55, pulse 99, temperature 98.5 F (36.9 C), temperature source Oral, resp. rate 16, height '5\' 9"'  (1.753 m), weight 112.2 kg (247 lb 5.7 oz), SpO2 94 %. No results found. Results for orders placed or performed during the hospital encounter of 08/27/15 (from the past 72 hour(s))  Glucose, capillary     Status: Abnormal   Collection Time: 09/06/15 11:31 AM  Result Value Ref Range   Glucose-Capillary 106 (H) 65 - 99 mg/dL  Glucose, capillary     Status: None   Collection Time: 09/06/15  4:27 PM  Result Value Ref Range   Glucose-Capillary 98 65 - 99 mg/dL  Glucose, capillary     Status: Abnormal   Collection Time: 09/06/15  9:51 PM  Result Value Ref Range   Glucose-Capillary 151 (H) 65 - 99 mg/dL  Protime-INR     Status: Abnormal   Collection Time: 09/07/15  4:33 AM  Result Value Ref Range   Prothrombin Time 28.3 (H) 11.6 - 15.2 seconds   INR 2.70 (H) 0.00 - 1.49  CBC with Differential/Platelet     Status: Abnormal   Collection Time: 09/07/15  4:33 AM  Result Value Ref Range   WBC 6.3 4.0 - 10.5 K/uL   RBC 3.49 (L) 4.22 - 5.81 MIL/uL   Hemoglobin 9.3 (L) 13.0 - 17.0 g/dL   HCT 31.2 (L) 39.0 - 52.0 %   MCV 89.4 78.0 - 100.0 fL   MCH 26.6 26.0 - 34.0 pg   MCHC 29.8 (L) 30.0 - 36.0 g/dL   RDW 16.4 (H) 11.5 - 15.5 %   Platelets 311 150 - 400 K/uL   Neutrophils Relative % 45 %   Lymphocytes Relative 20 %   Monocytes Relative 8 %   Eosinophils Relative 27 %   Basophils Relative 0 %   Neutro Abs 2.8 1.7 - 7.7 K/uL   Lymphs Abs 1.3 0.7 - 4.0 K/uL   Monocytes Absolute 0.5 0.1 - 1.0 K/uL   Eosinophils Absolute 1.7 (H) 0.0 - 0.7 K/uL   Basophils Absolute 0.0 0.0 - 0.1 K/uL   Smear Review MORPHOLOGY UNREMARKABLE   Basic metabolic panel     Status: Abnormal   Collection Time: 09/07/15  4:33 AM  Result Value Ref  Range   Sodium 137 135 - 145 mmol/L   Potassium 3.5 3.5 - 5.1 mmol/L   Chloride 103 101 - 111 mmol/L   CO2 30 22 - 32 mmol/L   Glucose, Bld 95 65 - 99 mg/dL   BUN 9 6 - 20 mg/dL   Creatinine, Ser 0.37 (L) 0.61 - 1.24 mg/dL   Calcium 8.1 (L) 8.9 - 10.3 mg/dL   GFR calc non Af Amer >60 >60 mL/min   GFR calc Af Amer >60 >60 mL/min    Comment: (NOTE) The eGFR has been calculated using the CKD EPI equation. This calculation has not been validated in all clinical situations. eGFR's persistently <60 mL/min signify possible Chronic Kidney Disease.    Anion gap 4 (L) 5 - 15  Magnesium     Status: None   Collection Time: 09/07/15  4:33 AM  Result Value Ref Range   Magnesium 1.7 1.7 - 2.4 mg/dL  Glucose, capillary     Status: None   Collection Time: 09/07/15  7:04 AM  Result Value Ref Range   Glucose-Capillary 93  65 - 99 mg/dL  Glucose, capillary     Status: None   Collection Time: 09/07/15 11:24 AM  Result Value Ref Range   Glucose-Capillary 90 65 - 99 mg/dL  Glucose, capillary     Status: None   Collection Time: 09/07/15  4:19 PM  Result Value Ref Range   Glucose-Capillary 93 65 - 99 mg/dL  Glucose, capillary     Status: None   Collection Time: 09/07/15 11:16 PM  Result Value Ref Range   Glucose-Capillary 99 65 - 99 mg/dL   Comment 1 Notify RN   Protime-INR     Status: Abnormal   Collection Time: 09/08/15  5:50 AM  Result Value Ref Range   Prothrombin Time 30.9 (H) 11.6 - 15.2 seconds   INR 3.04 (H) 0.00 - 1.49  Glucose, capillary     Status: None   Collection Time: 09/08/15  6:47 AM  Result Value Ref Range   Glucose-Capillary 90 65 - 99 mg/dL  Glucose, capillary     Status: None   Collection Time: 09/08/15 12:08 PM  Result Value Ref Range   Glucose-Capillary 91 65 - 99 mg/dL   Comment 1 Notify RN   Glucose, capillary     Status: Abnormal   Collection Time: 09/08/15  4:40 PM  Result Value Ref Range   Glucose-Capillary 103 (H) 65 - 99 mg/dL   Comment 1 Notify RN    Glucose, capillary     Status: Abnormal   Collection Time: 09/08/15  9:13 PM  Result Value Ref Range   Glucose-Capillary 115 (H) 65 - 99 mg/dL  Protime-INR     Status: Abnormal   Collection Time: 09/09/15  5:00 AM  Result Value Ref Range   Prothrombin Time 31.5 (H) 11.6 - 15.2 seconds   INR 3.12 (H) 0.00 - 1.49  Glucose, capillary     Status: None   Collection Time: 09/09/15  6:55 AM  Result Value Ref Range   Glucose-Capillary 96 65 - 99 mg/dL    Nursing note and vitals reviewed. Constitutional: He is oriented to person, place, and time.he is slower today He appears well-developed and well-nourished. No distress.  HENT:  Head: Normocephalic and atraumatic.  Eyes: Conjunctivae and EOM are normal.   Neck: Decreased range of motion present.  Trach with dry dressing in place.  Cardiovascular: Normal rate and regular rhythm. noES Respiratory: Effort normal and breath sounds normal. No respiratory distress. He has no wheezes.    GI: Soft. Bowel sounds are normal.  Non-tender. Midline incision has healed . Ostomy with bilious drainage. Multiple drains LLQ sutured in place.no erythema  Genitourinary:  Foley in place.  Musculoskeletal: He exhibits edema b/l LE  Right knee with effusion. No tenderness.  Skin: Left lower quadrant drains intact. Eschar left heel, right heel grade 1 Right lower shin with stasis changes. Multiple ulcers on bilateral heels Diffuse macular rash Neurological: He is alert and oriented to person, place, and time.  Able to phonate well with PMSV. Speech clear. Follows basic commands without difficulty.  Psychiatric: He has a normal mood and affect. His speech is normal and behavior is normal. Cognition and memory are normal.  Strength Antigravity through, except b/l hip flexors.   Sensation diminished in feet.    Assessment/Plan: 1. 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 Physiatrist is providing close team supervision and 24  hour management of active medical problems listed below. Physiatrist and rehab team continue to assess barriers to discharge/monitor patient progress toward functional and medical goals. Team conference today please see physician documentation under team conference tab, met with team face-to-face to discuss problems,progress, and goals. Formulized individual treatment plan based on medical history, underlying problem and comorbidities. FIM: Function - Bathing Position: Bed Body parts bathed by patient: Front perineal area Body parts bathed by helper: Buttocks, Right upper leg, Left upper leg, Right lower leg, Left lower leg Bathing not applicable: Right arm, Left arm, Chest, Abdomen Assist Level: 2 helpers  Function- Upper Body Dressing/Undressing What is the patient wearing?: Pull over shirt/dress Pull over shirt/dress - Perfomed by patient: Thread/unthread right sleeve, Thread/unthread left sleeve, Put head through opening Pull over shirt/dress - Perfomed by helper: Pull shirt over trunk Assist Level: 2 helpers Function - Lower Body Dressing/Undressing What is the patient wearing?: Pants, Underwear, Non-skid slipper socks Position: Bed Underwear - Performed by helper: Thread/unthread right underwear leg, Thread/unthread left underwear leg, Pull underwear up/down Pants- Performed by helper: Thread/unthread right pants leg, Thread/unthread left pants leg, Pull pants up/down Non-skid slipper socks- Performed by helper: Don/doff right sock, Don/doff left sock Assist for footwear: Dependant Assist for lower body dressing: 2 Helpers  Function - Toileting Toileting activity did not occur: Safety/medical concerns Assist level: Two helpers (per [place], NT report)  Function - Air cabin crew transfer activity did not  occur: Safety/medical concerns  Function - Chair/bed transfer Chair/bed transfer activity did not occur: Safety/medical concerns Chair/bed transfer method: Other Chair/bed transfer assist level: 2 helpers Chair/bed transfer assistive device: Facilities manager lift: Maximove  Function - Locomotion: Wheelchair Will patient use wheelchair at discharge?: Yes Type: Manual Wheelchair activity did not occur: Safety/medical concerns Max wheelchair distance: 40 Assist Level: Maximal assistance (Pt 25 - 49%) Wheel 50 feet with 2 turns activity did not occur: Safety/medical concerns Wheel 150 feet activity did not occur: Safety/medical concerns Function - Locomotion: Ambulation Ambulation activity did not occur: Safety/medical concerns Walk 10 feet activity did not occur: Safety/medical concerns Walk 50 feet with 2 turns activity did not occur: Safety/medical concerns Walk 150 feet activity did not occur: Safety/medical concerns Walk 10 feet on uneven surfaces activity did not occur: Safety/medical concerns  Function - Comprehension Comprehension: Auditory Comprehension assist level: Understands basic 75 - 89% of the time/ requires cueing 10 - 24% of the time  Function - Expression Expression: Verbal Expression assistive device: Talk trach valve Expression assist level: Expresses basic 90% of the time/requires cueing < 10% of the time.  Function - Social Interaction Social Interaction assist level: Interacts appropriately 90% of the time - Needs monitoring or encouragement for participation or interaction.  Function - Problem Solving Problem solving assist level: Solves basic 75 - 89% of the time/requires cueing 10 - 24% of the time  Function - Memory Memory assist level: Recognizes or recalls 50 - 74% of the time/requires cueing 25 - 49% of the time Patient normally able to recall (first 3 days only): Current season, Location of own room, Staff names and faces, That he or she  is in a hospital  1. 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  2. LLE DVT treatment /Anticoagulation: Pharmaceutical: Coumadin, appreciate Pharmacy assistance.  supratherapeutic but no evidence of bleeding Urine now amber  colored 3. Pain Management: Tylenol for prn for pain. Will continue oxycodone prn if needed for severe pain. 4. Mood: LCSW to follow for evaluation and support.  5. Neuropsych: This patient is capable of making decisions on his own behalf. 6. Skin/Wound Care:  appreciate.general surgery for sacral wound debridement, PRAFO ordered for heels, Cont air bed , Santyl collagenase for sacral decubitus. Hydrotherapy for physical therapy, will soon place wound VAC 7. Fluids/Electrolytes/Nutrition: Monitor I/O. Will consult dietician to help with food choices. Increase prosource to TID. Added ensure between meals as unable to tolerate a lot of food at one time.   - Pt eating 100% of meals.  8. Abdominal abscess: Continue drain care bid.  Flagyl to contiue till 10/26 per records. ID on consult, Pt refused contrast has some improvement of at least one abscess, s/p drain removal.due to Unasyn allergy, switched to imipenem 9. Hypokalemia: Improved. 4.0 on 10/17. Stable at 3.5 on 10/24 10. VDRF/ History of OSA: To continue current trach till mobility improves per CCM. Tolerating humidified air. We will need to reconsult pulmonary before decannulation,thus far patient not gaining much strength or endurance 11. Anxiety disorder: Will resume low dose Xanax as used this for insomnia at home. given his respiratory compromise will need to limit dosing.  Improving.  12. Anemia: Likely due to infection/chronic illness. Hgb stable at 9.3 on 10/24.  Will cont to monitor.  13. Anasarca: Improved Continue Lasix, protein supplements.  14. Diastolic CHF: Off amiodarone, Ace and statin at this time due to recurrent hypotension and multiple medical issues. Monitor for signs of overload. Check daily weights. Monitor BP bid.  15.  Hypomagnesemia-labs personally reviewed. increase supplement to 869m BID, repeat Mg level on 10/24, normal at 1.7        16. Puritic rash: Unasyn discontinued with improvements. LOS (Days) 13 A FACE TO FACE EVALUATION WAS PERFORMED KIRSTEINS,ANDREW E 09/09/2015, 9:23 AM

## 2015-09-09 NOTE — Progress Notes (Signed)
Occupational Therapy Session Note  Patient Details  Name: Jonathan Le MRN: [id number] Date of Birth: 12/02/1945  Today's Date: 09/09/2015 OT Individual Time: 1400-1503 OT Individual Time Calculation (min): 63 min    Short Term Goals: Week 2:  OT Short Term Goal 1 (Week 2): Pt will complete LB dressing at bed level with max assist OT Short Term Goal 2 (Week 2): Pt will donn pullover shirt EOB with close supervision. OT Short Term Goal 3 (Week 2): Pt will perform UB bathing sitting unsupported EOB with supervision, including reaching both upper shoulders.  OT Short Term Goal 4 (Week 2): Pt will transition sidelying to sitting in order to complete seltcare tasks with mod assist 2 consecutive sessions OT Short Term Goal 5 (Week 2): Pt will perform sliding board transfer from bed to wheelchair with max assist.  Skilled Therapeutic Interventions/Progress Updates:    Pt seen for OT session worked on transferring from supine to sitting EOB and then to wheelchair using the sliding board.  He needed max assist with max demonstrational cueing to sequence through rolling and supine to sit.  Max assist needed to move LEs off of the EOB as well as transitioning UE from sidelying to sitting on the left side.  Max assist also needed for reciprical scooting to the EOB as well.  Total +2 for transfer from bed to wheelchair via sliding board.  Pt unable to efficiently use his UEs or LEs during transfer.  Positioned 4-5 inch step under his feet as well secondary to height of bed and wheelchair.  Had pt work on wheelchair mobility during session with mod instructional cueing and min assist at times.  Pt with decreased efficiency using his UEs simultaneously to advance the wheelchair the greatest amount.  Pt rolled down to the therapy gym and 3/4 of the way back, but needed greater time than normal to complete pushing.  Performed pressure relief to pt for 2 mins by tilting his chair posterior before leaving him up  and beside of his bed.  Call button and phone within reach.  Instructed pt to notify nursing in 45 mins for next pressure relief.  Nursing also made aware.    Therapy Documentation Precautions:  Precautions Precautions: Fall Precaution Comments: continuous pulse ox, trach, O2, sacral decub with increased pain Restrictions Weight Bearing Restrictions: No Other Position/Activity Restrictions: trach collar  Vital Signs: Therapy Vitals Temp: 97.6 F (36.4 C) Temp Source: Oral Pulse Rate: 96 Resp: 18 BP: 114/71 mmHg Patient Position (if appropriate): Sitting Oxygen Therapy SpO2: 100 % O2 Device: Not Delivered FiO2 (%): 21 % Pain: Pain Assessment Pain Assessment: Faces Faces Pain Scale: Hurts a little bit Pain Type: Acute pain Pain Location: Sacrum Pain Descriptors / Indicators: Burning Pain Intervention(s): Repositioned;Emotional support;Ambulation/increased activity ADL: See Function Navigator for Current Functional Status.   Therapy/Group: Individual Therapy  Leslie Jester OTR/L 09/09/2015, 3:57 PM

## 2015-09-09 NOTE — Progress Notes (Signed)
Speech Language Pathology Daily Session Note  Patient Details  Name: Jonathan Le MRN: [id number] Date of Birth: 10/16/1946  Today's Date: 09/09/2015 SLP Individual Time: 0800-0900 SLP Individual Time Calculation (min): 60 min  Short Term Goals: Week 2: SLP Short Term Goal 1 (Week 2): STG=LTG as he is quickly approaching long term goals   Skilled Therapeutic Interventions:  Pt was seen for skilled ST targeting cognitive goals.  Pt bright, alert, and agreeable to participate in therapy this AM and was able to recall task protocols and procedures for a new learning task targeted in a previous ST therapy session with supervision question cues.  Pt planned and executed a problem solving strategy during the abovementioned task with supervision cues which SLP was able to fade to Mod I as task progressed due to increased task familiarity.  SLP administered portions of the MoCA to complete ongoing diagnostic treatment of cognition as pt appears to be nearing long term goals.  Pt demonstrated improved emergent awareness of deficits and was able to correct errors in 2 out of 3 instances.  Min-supervision cues were needed for planning and organization during the clock drawing subtest.  Pt was 100% accurate for naming during naming subtest.  Will follow up for x1 more ST session to complete exit assessment and education.  Pt left in bed with call bell within reach.  Continue per current plan of care.    Function:  Eating Eating                 Cognition Comprehension Comprehension assist level: Understands basic 75 - 89% of the time/ requires cueing 10 - 24% of the time  Expression Expression assistive device: Talk trach valve Expression assist level: Expresses basic 90% of the time/requires cueing < 10% of the time.  Social Interaction Social Interaction assist level: Interacts appropriately 90% of the time - Needs monitoring or encouragement for participation or interaction.  Problem Solving  Problem solving assist level: Solves basic 75 - 89% of the time/requires cueing 10 - 24% of the time  Memory Memory assist level: Recognizes or recalls 50 - 74% of the time/requires cueing 25 - 49% of the time    Pain Pain Assessment Pain Assessment: No/denies pain  Therapy/Group: Individual Therapy  Kaziyah Parkison, Melanee SpryNicole L 09/09/2015, 10:25 AM

## 2015-09-10 ENCOUNTER — Inpatient Hospital Stay (HOSPITAL_COMMUNITY): Payer: Medicare Other

## 2015-09-10 ENCOUNTER — Ambulatory Visit (HOSPITAL_COMMUNITY): Payer: Medicare Other

## 2015-09-10 ENCOUNTER — Inpatient Hospital Stay (HOSPITAL_COMMUNITY): Payer: Medicare Other | Admitting: Speech Pathology

## 2015-09-10 ENCOUNTER — Inpatient Hospital Stay (HOSPITAL_COMMUNITY): Payer: Medicare Other | Admitting: Occupational Therapy

## 2015-09-10 LAB — GLUCOSE, CAPILLARY
GLUCOSE-CAPILLARY: 125 mg/dL — AB (ref 65–99)
Glucose-Capillary: 84 mg/dL (ref 65–99)
Glucose-Capillary: 117 mg/dL — ABNORMAL HIGH (ref 65–99)
Glucose-Capillary: 132 mg/dL — ABNORMAL HIGH (ref 65–99)

## 2015-09-10 LAB — PROTIME-INR
INR: 2.7 — AB (ref 0.00–1.49)
PROTHROMBIN TIME: 28.3 s — AB (ref 11.6–15.2)

## 2015-09-10 MED ORDER — WARFARIN SODIUM 5 MG PO TABS
5.0000 mg | ORAL_TABLET | Freq: Once | ORAL | Status: AC
  Administered 2015-09-10: 5 mg via ORAL
  Filled 2015-09-10: qty 1

## 2015-09-10 NOTE — Progress Notes (Signed)
Social Work Patient ID: Ardyth HarpsHarold D Clear, male   DOB: 10/14/1946, 69 y.o.   MRN: [id number] Spoke with wife via telephone to discuss team conference and his progression and she is very angry that rehab would even think he is ready to leave and go somewhere else. She feels it is the surgeon's and hospitals fault he is the way he is and his multiple medical issues. She states: " He needs to stay here until he can come home, you all caused this." She does not want to pursue LTAC or NH she will not sent him to another facility. She reports she has an attorney and has had to go on medication due to the stress it has caused her.

## 2015-09-10 NOTE — Progress Notes (Signed)
Subjective/Complaints: No breathing issues. 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 We discussed level of care in that he has several issues that may make skilled nursing placement more difficult. These include tracheostomy as well as needing an air bed and wound nurse Patient's wife has talked to social work and has refused taking patient home placing him in an LTAC or placing him in skilled nursing.                                                                                                                                                                                                                                                                      ROS: +puritis.  Denies CP, SOB, n/v/d.   Objective: Vital Signs: Blood pressure 103/59, pulse 96, temperature 97.9 F (36.6 C), temperature source Oral, resp. rate 22, height 5\' 9"  (1.753 m), weight 111.5 kg (245 lb 13 oz), SpO2 96 %. No results found. Results for orders placed or performed during the hospital encounter of 08/27/15 (from the past 72 hour(s))  Glucose, capillary     Status: None   Collection Time: 09/07/15  4:19 PM  Result Value Ref Range   Glucose-Capillary 93 65 - 99 mg/dL  Glucose, capillary     Status: None   Collection Time: 09/07/15 11:16 PM  Result Value Ref Range   Glucose-Capillary 99 65 - 99 mg/dL   Comment 1 Notify RN   Protime-INR     Status: Abnormal   Collection Time: 09/08/15  5:50 AM  Result Value Ref Range   Prothrombin Time 30.9 (H) 11.6 - 15.2 seconds   INR 3.04 (H) 0.00 - 1.49  Glucose, capillary     Status: None   Collection Time: 09/08/15  6:47 AM  Result Value Ref Range   Glucose-Capillary 90 65 - 99 mg/dL  Glucose, capillary     Status: None   Collection Time: 09/08/15 12:08 PM  Result Value Ref Range   Glucose-Capillary 91 65 - 99 mg/dL   Comment 1 Notify RN    Glucose, capillary     Status: Abnormal   Collection Time: 09/08/15  4:40 PM  Result Value  Ref Range   Glucose-Capillary 103 (H) 65 - 99 mg/dL   Comment 1 Notify RN   Glucose, capillary     Status: Abnormal   Collection Time: 09/08/15  9:13 PM  Result Value Ref Range   Glucose-Capillary 115 (H) 65 - 99 mg/dL  Protime-INR     Status: Abnormal   Collection Time: 09/09/15  5:00 AM  Result Value Ref Range   Prothrombin Time 31.5 (H) 11.6 - 15.2 seconds   INR 3.12 (H) 0.00 - 1.49  Glucose, capillary     Status: None   Collection Time: 09/09/15  6:55 AM  Result Value Ref Range   Glucose-Capillary 96 65 - 99 mg/dL  Glucose, capillary     Status: Abnormal   Collection Time: 09/09/15 11:20 AM  Result Value Ref Range   Glucose-Capillary 123 (H) 65 - 99 mg/dL  Glucose, capillary     Status: Abnormal   Collection Time: 09/09/15  4:34 PM  Result Value Ref Range   Glucose-Capillary 118 (H) 65 - 99 mg/dL  Glucose, capillary     Status: Abnormal   Collection Time: 09/09/15  9:16 PM  Result Value Ref Range   Glucose-Capillary 103 (H) 65 - 99 mg/dL  Protime-INR     Status: Abnormal   Collection Time: 09/10/15  5:00 AM  Result Value Ref Range   Prothrombin Time 28.3 (H) 11.6 - 15.2 seconds   INR 2.70 (H) 0.00 - 1.49  Glucose, capillary     Status: None   Collection Time: 09/10/15  7:02 AM  Result Value Ref Range   Glucose-Capillary 84 65 - 99 mg/dL  Glucose, capillary     Status: Abnormal   Collection Time: 09/10/15 12:19 PM  Result Value Ref Range   Glucose-Capillary 117 (H) 65 - 99 mg/dL    Nursing note and vitals reviewed. Constitutional: He is oriented to person, place, and time.he is slower today He appears well-developed and well-nourished. No distress.  HENT:  Head: Normocephalic and atraumatic.  Eyes: Conjunctivae and EOM are normal.   Neck: Decreased range of motion present.  Trach with dry dressing in place.  Cardiovascular: Normal rate and regular rhythm.  noES Respiratory: Effort normal and breath sounds normal. No respiratory distress. He has no wheezes.    GI: Soft. Bowel sounds are normal.  Non-tender. Midline incision has healed . Ostomy with bilious drainage. Multiple drains LLQ sutured in place.no erythema  Genitourinary:  Foley in place.  Musculoskeletal: He exhibits edema b/l LE  Right knee with effusion. No tenderness.  Skin: Left lower quadrant drains intact. Eschar left heel, right heel grade 1 Right lower shin with stasis changes. Multiple ulcers on bilateral heels Diffuse macular rash Neurological: He is alert and oriented to person, place, and time.  Able to phonate well with PMSV. Speech clear. Follows basic commands without difficulty.  Psychiatric: He has a normal mood and affect. His speech is normal and behavior is normal. Cognition and memory are normal.  Strength Antigravity through, except b/l hip flexors.   Sensation diminished in feet.    Assessment/Plan: 1. 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 Physiatrist is providing close team supervision and 24 hour management of active medical problems listed below. Physiatrist and rehab team continue to assess barriers to discharge/monitor patient progress toward functional and medical goals. Patient may have drains removed if output diminishes over  the next 3-5 days and CT scan shows resolution of abscess. Ask pulmonary to reevaluate next weekFor potential decannulation Should be done with hydrotherapy pretty soon and sacral wound can be managed with Wound VAC FIM: Function - Bathing Position: Bed Body parts bathed by patient: Front perineal area Body parts bathed by helper: Buttocks, Right upper leg, Left upper leg, Right lower leg, Left lower leg Bathing not applicable: Right arm,  Left arm, Chest, Abdomen Assist Level: 2 helpers  Function- Upper Body Dressing/Undressing What is the patient wearing?: Pull over shirt/dress Pull over shirt/dress - Perfomed by patient: Thread/unthread right sleeve, Thread/unthread left sleeve, Put head through opening Pull over shirt/dress - Perfomed by helper: Pull shirt over trunk Assist Level: 2 helpers Function - Lower Body Dressing/Undressing What is the patient wearing?: Pants, Underwear, Non-skid slipper socks Position: Bed Underwear - Performed by helper: Thread/unthread right underwear leg, Thread/unthread left underwear leg, Pull underwear up/down Pants- Performed by helper: Thread/unthread right pants leg, Thread/unthread left pants leg, Pull pants up/down Non-skid slipper socks- Performed by helper: Don/doff right sock, Don/doff left sock Assist for footwear: Dependant Assist for lower body dressing: 2 Helpers  Function - Toileting Toileting activity did not occur: Safety/medical concerns Assist level: Two helpers (per [place], NT report)  Function - Archivist transfer activity did not occur: Safety/medical concerns  Function - Chair/bed transfer Chair/bed transfer activity did not occur: Safety/medical concerns Chair/bed transfer method: Other Chair/bed transfer assist level: 2 helpers Chair/bed transfer assistive device: Systems developer lift: Maximove  Function - Locomotion: Wheelchair Will patient use wheelchair at discharge?: Yes Type: Manual Wheelchair activity did not occur: Safety/medical concerns Max wheelchair distance: 40 Assist Level: Maximal assistance (Pt 25 - 49%) Wheel 50 feet with 2 turns activity did not occur: Safety/medical concerns Wheel 150 feet activity did not occur: Safety/medical concerns Function - Locomotion: Ambulation Ambulation activity did not occur: Safety/medical concerns Walk 10 feet activity did not occur: Safety/medical concerns Walk 50 feet with 2  turns activity did not occur: Safety/medical concerns Walk 150 feet activity did not occur: Safety/medical concerns Walk 10 feet on uneven surfaces activity did not occur: Safety/medical concerns  Function - Comprehension Comprehension: Auditory Comprehension assist level: Understands basic 75 - 89% of the time/ requires cueing 10 - 24% of the time  Function - Expression Expression: Verbal Expression assistive device: Talk trach valve Expression assist level: Expresses basic 90% of the time/requires cueing < 10% of the time.  Function - Social Interaction Social Interaction assist level: Interacts appropriately 90% of the time - Needs monitoring or encouragement for participation or interaction.  Function - Problem Solving Problem solving assist level: Solves basic 75 - 89% of the time/requires cueing 10 - 24% of the time  Function - Memory Memory assist level: Recognizes or recalls 50 - 74% of the time/requires cueing 25 - 49% of the time Patient normally able to recall (first 3 days only): Current season, Location of own room, Staff names and faces, That he or she is in a hospital  1. 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  2. LLE DVT treatment /Anticoagulation: Pharmaceutical: Coumadin, appreciate Pharmacy assistance.  supratherapeutic but no evidence of bleeding Urine now amber colored 3. Pain Management: Tylenol for prn for pain. Will continue oxycodone prn if needed for severe pain. 4. Mood: LCSW to  follow for evaluation and support.  5. Neuropsych: This patient is capable of making decisions on his own behalf. 6. Skin/Wound Care:  appreciate.general surgery for sacral wound debridement, PRAFO ordered for heels, Cont air bed , Santyl collagenase for  sacral decubitus. Hydrotherapy for physical therapy, will soon place wound VAC 7. Fluids/Electrolytes/Nutrition: Monitor I/O. Will consult dietician to help with food choices. Increase prosource to TID. Added ensure between meals as unable to tolerate a lot of food at one time.   - Pt eating 100% of meals.  8. Abdominal abscess: Continue drain care bid.  Flagyl to contiue till 10/26 per records. ID on consult, Pt refused contrast has some improvement of at least one abscess, s/p drain removal.due to Unasyn allergy, switched to imipenem 9. Hypokalemia: Improved. 4.0 on 10/17. Stable at 3.5 on 10/24 10. VDRF/ History of OSA: To continue current trach till mobility improves per CCM. Tolerating humidified air. We will need to reconsult pulmonary before decannulation,thus far patient not gaining much strength or endurance, We'll reevaluate next week 11. Anxiety disorder: Will resume low dose Xanax as used this for insomnia at home. given his respiratory compromise will need to limit dosing.  Improving.  12. Anemia: Likely due to infection/chronic illness. Hgb stable at 9.3 on 10/24.  Will cont to monitor. 13. Anasarca: Improved Continue Lasix, protein supplements.  14. Diastolic CHF: Off amiodarone, Ace and statin at this time due to recurrent hypotension and multiple medical issues. Monitor for signs of overload. Check daily weights. Monitor BP bid.  15.  Hypomagnesemia-. Magnesium  BID, repeat Mg level on 10/24, normal at 1.7        16. Puritic rash: Unasyn discontinued with improvements. LOS (Days) 14 A FACE TO FACE EVALUATION WAS PERFORMED Jeoffrey Eleazer E 09/10/2015, 3:35 PM

## 2015-09-10 NOTE — Progress Notes (Signed)
Occupational Therapy Session Note  Patient Details  Name: Jonathan Le MRN: [id number] Date of Birth: 07/13/1946  Today's Date: 09/10/2015 OT Individual Time: 1431-1550 OT Individual Time Calculation (min): 79 min    Short Term Goals: Week 2:  OT Short Term Goal 1 (Week 2): Pt will complete LB dressing at bed level with max assist OT Short Term Goal 2 (Week 2): Pt will donn pullover shirt EOB with close supervision. OT Short Term Goal 3 (Week 2): Pt will perform UB bathing sitting unsupported EOB with supervision, including reaching both upper shoulders.  OT Short Term Goal 4 (Week 2): Pt will transition sidelying to sitting in order to complete seltcare tasks with mod assist 2 consecutive sessions OT Short Term Goal 5 (Week 2): Pt will perform sliding board transfer from bed to wheelchair with max assist.  Skilled Therapeutic Interventions/Progress Updates:    Bathing and dressing supine to sit EOB.  Pt needing mod demonstrational cueing and mod assist for rolling in bed in order for therapist to assist with washing his bottom as well as threading and pulling his shorts over his hips.  He was able to perform all UB bathing from supine position secondary to time.  With transition to sitting pt's colostomy leaked resulting in the need to return to supine and clean his right side and change his shorts.  Transitioned back to sitting with max assist and transferred to wheelchair with total assist +2 (pt 20%) using the sliding board.  Pt positioned next to bed in wheelchair with call button and phone within reach.  Instructed to call for pressure relief in 45 mins to 1 hour and every 45 mins to 1 hour after that.  Nurse Teaching laboratory techniciantech Amber also made aware.    Therapy Documentation Precautions:  Precautions Precautions: Fall Precaution Comments: continuous pulse ox, trach, O2, sacral decub with increased pain Restrictions Weight Bearing Restrictions: No Other Position/Activity Restrictions: trach  collar  Pain: Pain Assessment Pain Assessment: No/denies pain ADL: See Function Navigator for Current Functional Status.   Therapy/Group: Individual Therapy  Jonathan Le OTR/L 09/10/2015, 4:31 PM

## 2015-09-10 NOTE — Progress Notes (Signed)
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 Subcutaneous fat may be visible but bone, tendon or muscle are NOT exposed. (Active)  Dressing Type ABD;Barrier Film (skin prep);Gauze (Comment);Moist to dry;Tape dressing;Other (Comment) 09/10/2015 10:05 AM  Dressing Changed;Clean;Dry;Intact 09/10/2015 10:05 AM  Dressing Change Frequency Twice a day 09/10/2015 10:05 AM  State of Healing Early/partial granulation 09/10/2015 10:05 AM  Site / Wound Assessment Pink;Red;Yellow 09/10/2015 10:05 AM  % Wound base Red or Granulating 45% 09/10/2015 10:05 AM  % Wound base Yellow 55% 09/10/2015 10:05 AM  % Wound base Black 0% 09/10/2015 10:05 AM  % Wound base Other (Comment) 0% 09/10/2015 10:05 AM  Peri-wound Assessment Intact 09/10/2015 10:05 AM  Wound Length (cm) 7.9 cm 09/07/2015 10:13 AM  Wound Width (cm) 9.1 cm 09/07/2015 10:13 AM  Wound Depth (cm) 4.5 cm 09/07/2015 10:13 AM  Tunneling (cm) 5.6 at 12-1:00 o'clock 09/07/2015 10:13 AM  Margins Unattached edges (unapproximated) 09/10/2015 10:05 AM  Drainage Amount Copious 09/10/2015 10:05 AM  Drainage Description Serosanguineous;No odor 09/10/2015 10:05 AM  Treatment Debridement (Selective);Hydrotherapy (Pulse lavage);Off loading;Packing (Saline gauze);Tape changed;Other (Comment) 09/10/2015 10:05 AM     Santyl applied to wound bed prior to applying dressing.  Hydrotherapy Pulsed lavage therapy - wound location: sacrum/buttocks Pulsed Lavage with Suction (psi): 8 psi (4-8) Pulsed Lavage with Suction  - Normal Saline Used: 1000 mL Pulsed Lavage Tip: Tip with splash shield Selective Debridement Selective Debridement - Location: sacrum/buttocks Selective Debridement - Tools Used: Forceps;Scissors Selective Debridement - Tissue Removed: yellow necrotic tissue   Wound Assessment and Plan  Wound Therapy - Assess/Plan/Recommendations Wound Therapy - Clinical Statement: More slough and and eschar removed from wound. Focused on deep underside today with large amounts extracted. Continue with current plan of care. Wound Therapy - Functional Problem List: Pt with decr sitting tolerance due to sacral wound. Factors Delaying/Impairing Wound Healing: Immobility;Multiple medical problems;Polypharmacy Hydrotherapy Plan: Debridement;Dressing change;Patient/family education;Pulsatile lavage with suction Wound Therapy - Frequency: 6X / week Wound Therapy - Follow Up Recommendations: Home health RN Wound Plan: See above  Wound Therapy Goals- Improve the function of patient's integumentary system by progressing the wound(s) through the phases of wound healing (inflammation - proliferation - remodeling) by: Decrease Necrotic Tissue to: 10 Decrease Necrotic Tissue - Progress: Progressing toward goal Increase Granulation Tissue to: 90 Increase Granulation Tissue - Progress: Progressing toward goal  Goals will be updated until maximal potential achieved or discharge criteria met.  Discharge criteria: when goals achieved, discharge from hospital, MD decision/surgical intervention, no progress towards goals, refusal/missing three consecutive treatments without notification or medical reason.  GP     [name] 09/10/2015, 10:10 AM Elayne Snare, [place]

## 2015-09-10 NOTE — Progress Notes (Signed)
Physical Therapy Session Note  Patient Details  Name: Jonathan Le MRN: [id number] Date of Birth: 01/01/1946  Today's Date: 09/10/2015 PT Individual Time: 1105-1205 PT Individual Time Calculation (min): 60 min   Short Term Goals: Week 2:  PT Short Term Goal 1 (Week 2): Pt will perform w/c propulsion 50' with mod A PT Short Term Goal 2 (Week 2): Pt will sit EOB x 10 minutes with supervision for functional UE task PT Short Term Goal 3 (Week 2): Pt will transfer bed <> chair consistently with max A of 2  Skilled Therapeutic Interventions/Progress Updates:   Sister stated pt had Voltaren on bil knees before tx.  Bed mobility to complete donning of briefs and shorts.   Family and pt education for generalized strengthening hand- out, plus modified abdominal crunches, to be performed daily in room, in supine, with sister's assist.  Sister return -demonstrated exercises using hand out.  Pt demonstrated understanding of exercises, and initiative in performing them.  PT encouraged pt's active participation in all movements to assist with bed mobility, clothing, and performance of hospital ex program. With use of MaxiSlide fabric under LEs, pt demonstrated R/L :  1/2 range heel slides, full range hip abduction, 1/2 range ankle circles, full range short arc knee extension.   Pt able to demonstrate bil glut set today, and place each hand under buttock in supine for tactile feedback.  PT wrapped bil LEs with 3 ACE wraps for edema control and pain control knees prior to upright sitting in w/c later in day.  PT requested NTs transfer pt using MaxiMove out of bed> w/c after lunch.     Therapy Documentation Precautions:  Precautions Precautions: Fall Precaution Comments: continuous pulse ox, trach, O2, sacral decub with increased pain Restrictions Weight Bearing Restrictions: No Other Position/Activity Restrictions: trach collar   Pain: Pain Assessment Pain Assessment: 5/10 sacral wounds;  hydrotherapy recently finished     See Function Navigator for Current Functional Status.   Therapy/Group: Individual Therapy  Adi Doro 09/10/2015, 12:19 PM

## 2015-09-10 NOTE — Progress Notes (Cosign Needed)
Referring Physician(s): Derrell Lolling  Chief Complaint:  Colectomy/colostomy Post op intraabdominal abscesses  Subjective:  B drains placed 9/6 2 new L drains added 9/29 Rt drain removed 10/18 3 L drains remain Still with output of 15-30 cc /daily each afeb Wbc wnl   Allergies: Unasyn; Levaquin; Penicillins; and Doxycycline  Medications: Prior to Admission medications   Medication Sig Start Date End Date Taking? Authorizing Provider  Amino Acids-Protein Hydrolys (FEEDING SUPPLEMENT, PRO-STAT SUGAR FREE 64,) LIQD Place 30 mLs into feeding tube 5 (five) times daily. 07/25/15  Yes Jeanella Craze, NP  white petrolatum (VASELINE) GEL Apply 1 application topically daily. Apply to L nare wound daily 07/08/15  Yes Jeanella Craze, NP     Vital Signs: BP 103/59 mmHg  Pulse 96  Temp(Src) 97.9 F (36.6 C) (Oral)  Resp 22  Ht  (1.753 m)  Wt 245 lb 13 oz (111.5 kg)  BMI 36.28 kg/m2  SpO2 96%  Physical Exam  Abdominal: Soft. Bowel sounds are normal.  L drains intact Output serous color 15-30 cc daily out of each Skin clean and dry NT  no sign of infection  Nursing note and vitals reviewed.   Imaging: No results found.  Labs:  CBC:  Recent Labs  08/16/15 0701 08/20/15 0648 08/28/15 0430 09/07/15 0433  WBC 9.2 5.2 6.6 6.3  HGB 7.5* 8.8* 8.2* 9.3*  HCT 25.6* 29.3* 26.9* 31.2*  PLT 387 328 349 311    COAGS:  Recent Labs  07/21/15 0815  07/23/15 1200 07/25/15 0419 07/30/15 0627  09/07/15 0433 09/08/15 0550 09/09/15 0500 09/10/15 0500  INR 1.65*  < > 2.62* 1.48 1.35  < > 2.70* 3.04* 3.12* 2.70*  APTT 29  --  38* 28 31  --   --   --   --   --   < > = values in this interval not displayed.  BMP:  Recent Labs  08/28/15 0430 08/29/15 0502 08/31/15 0445 09/07/15 0433  NA 139 135 135 137  K 3.3* 3.6 4.0 3.5  CL 99* 97* 98* 103  CO2 33* 31 32 30  GLUCOSE 100* 99 94 95  BUN CALCIUM 8.1* 8.0* 8.1* 8.1*  CREATININE 0.32* 0.35* 0.33*  0.37*  GFRNONAA >60 >60 >60 >60  GFRAA >60 >60 >60 >60    LIVER FUNCTION TESTS:  Recent Labs  07/23/15 0600 07/24/15 0345 07/25/15 0419  07/28/15 0630 08/02/15 0620 08/16/15 0701 08/28/15 0430  BILITOT 1.0 0.7 0.9  --   --   --   --  0.6  AST 30 40 35  --   --   --   --  22  ALT 15* 15* 14*  --   --   --   --  10*  ALKPHOS 154* 167* 199*  --   --   --   --  80  PROT 4.6* 4.3* 4.5*  --   --   --   --  5.2*  ALBUMIN <1.0* <1.0* <1.0*  < > 1.4* 1.2* 1.2* 1.6*  < > = values in this interval not displayed.  Assessment and Plan:  Abd abscess drain intact Keep for now Will need CT when output 10 cc/daily/each prob within 3-5 days  Signed: Jerine Surles A 09/10/2015, 2:54 PM   I spent a total of 15 Minutes at the the patient's bedside AND on the patient's hospital floor or unit, greater than 50% of which was counseling/coordinating care for abscess  drains

## 2015-09-10 NOTE — Progress Notes (Signed)
Speech Language Pathology Discharge Summary  Patient Details  Name: Jonathan Le MRN: [id number] Date of Birth: [birth year]  Today's Date: 09/10/2015 SLP Individual Time: 0800-0830 SLP Individual Time Calculation (min): 30 min   Skilled Therapeutic Interventions:  Pt was seen for skilled ST targeting completion of exit assessment and education prior to discharge from [facility] services.  Pt's final score on the MoCA was 27/30 (n>/26) indicating cognitive function to be within functional limits.  Continue to suspect that clinical deficits of decreased awareness of current level of function and decreased recall of daily information are behavioral/?psychological in nature due to significantly prolonged hospitalization as pt is able to selectively retain pieces of information across multiple therapy sessions.  Discussed with pt taking an active/proactive role in his rehabilitation and directing care for tasks which he is unable to physically complete himself.  Pt verbalized understanding and in agreement with recommendations.  All education is complete at this time.  No further ST needs.     Patient has met 3 of 3 long term goals.  Patient to discharge at overall Supervision level.  Reasons goals not met: n/a   Clinical Impression/Discharge Summary:  Pt made functional gains while inpatient and is discharging from [facility] services having met 3 out of 3 long term goals.  Pt is currently a fairly consistent supervision level assist for basic cognitive tasks; however, depending on pt's alertness and motivation to participate in therapies, pt can fluctuate from max assist to mod I.  Pt is tolerating Passy Muir speaking valve during all waking hours and has returned demonstration of how to donn/doff valve with mod I; however, recommend that he have assistance for placing and removing valve due to fluctuations in alertness and decreased manual dexterity which increase his risk of dislodging his trach or dropping and  losing his valve.  Pt is likely discharging to SNF or LTACH.  All education is complete at this time.  No further ST needs are indicated.  Care Partner:  Caregiver Able to Provide Assistance: Other (comment) (likely SNF placement or return to [facility])  Type of Caregiver Assistance:  (SNF versus LTACH placement )  Recommendation:  None      Equipment: Passy Muir speaking valve if not decannulated prior to discharge    Reasons for discharge: Treatment goals met   Patient/Family Agrees with Progress Made and Goals Achieved: Yes   Function:  Eating Eating                 Cognition Comprehension Comprehension assist level: Understands basic 75 - 89% of the time/ requires cueing 10 - 24% of the time  Expression Expression assistive device: Talk trach valve Expression assist level: Expresses basic needs/ideas: With extra time/assistive device  Social Interaction Social Interaction assist level: Interacts appropriately 90% of the time - Needs monitoring or encouragement for participation or interaction.  Problem Solving Problem solving assist level: Solves basic 90% of the time/requires cueing < 10% of the time  Memory Memory assist level: Recognizes or recalls 90% of the time/requires cueing < 10% of the time   Windell Moulding L 09/10/2015, 12:08 PM

## 2015-09-10 NOTE — Progress Notes (Signed)
Patient seen for trach team follow up.  Wife inquired about possibility of being decannulated.  Informed her of risks of procedure at this time.  Advised her to follow up with MD regarding this care.  No other education needed at this time.

## 2015-09-10 NOTE — Progress Notes (Signed)
Trach Care Progression Note   Patient Details Name: Jonathan Le MRN: [id number] DOB: 08/29/1946 Today's Date: 09/10/2015   Tracheostomy Assessment    Tracheostomy Shiley 6 mm Uncuffed (Active)  Status Passy Muir Speaking valve 09/10/2015 11:40 AM  Site Assessment Clean;Dry 09/10/2015 11:40 AM  Site Care Protective barrier to skin 09/10/2015  3:32 AM  Inner Cannula Care Changed/new 09/09/2015 12:30 AM  Ties Assessment Clean;Dry;Secure 09/10/2015 11:40 AM  Cuff pressure (cm) 0 cm 09/10/2015 11:40 AM  Emergency Equipment at bedside Yes 09/10/2015 11:40 AM     Care Needs     Respiratory Therapy Tracheostomy: Chronic trach O2 Device: Not Delivered FiO2 (%): 21 % SpO2: 96 % Education: Ongoing Who was educated?: Patient, Family Follow up recommendations:  (will follow for progression) Respiratory barriers to progression: Patient safety (risk of aspiration)  09/10/15--Wife was asking about decannulation. She is wanting a pulmonary consult to see if this is a possibility.     Speech Language Pathology  Patient may use Passy-Muir Speech Valve: During all waking hours (remove during sleep) PMSV Supervision: Intermittent MD: Please consider changing trach tube to : Smaller size       Nutritional Patient's Current Diet: Cardiac SLP Diet Recommendations: Age appropriate regular solids, Thin              [name], Riley NearingBonnie Caroline 09/10/2015, 2:14 PM

## 2015-09-10 NOTE — Progress Notes (Signed)
ANTICOAGULATION CONSULT NOTE - Follow Up Consult  Pharmacy Consult for coumadin and imipenem Indication: DVT and intra-abdominal infection, respectively  Allergies  Allergen Reactions  . Unasyn [Ampicillin-Sulbactam Sodium] Hives, Itching and Rash    Diffuse rash not responding to Pepcid and Benadryl.  TDD.  Marland Kitchen. Levaquin [Levofloxacin] Nausea Only  . Penicillins Hives  . Doxycycline Rash    Patient Measurements: Height: 5\' 9"  (175.3 cm) Weight: 245 lb 13 oz (111.5 kg) IBW/kg (Calculated) : 70.7 Heparin Dosing Weight:   Vital Signs: Temp: 97.9 F (36.6 C) (10/27 0427) Temp Source: Oral (10/27 0427) BP: 103/59 mmHg (10/27 0427) Pulse Rate: 102 (10/27 0427)  Labs:  Recent Labs  09/08/15 0550 09/09/15 0500 09/10/15 0500  LABPROT 30.9* 31.5* 28.3*  INR 3.04* 3.12* 2.70*    Estimated Creatinine Clearance: 105.7 mL/min (by C-G formula based on Cr of 0.37).   Medications:  Scheduled:  . antiseptic oral rinse  15 mL Mouth Rinse TID PC  . collagenase   Topical BID  . diclofenac sodium  2 g Topical QID  . famotidine  20 mg Oral BID  . feeding supplement (ENSURE ENLIVE)  237 mL Oral Q24H  . feeding supplement (PRO-STAT SUGAR FREE 64)  30 mL Oral TID  . furosemide  60 mg Oral Daily  . hydrocerin   Topical BID  . imipenem-cilastatin  500 mg Intravenous 4 times per day  . insulin aspart  0-5 Units Subcutaneous QHS  . insulin aspart  0-9 Units Subcutaneous TID WC  . magnesium oxide  800 mg Oral BID  . multivitamin with minerals  1 tablet Oral Daily  . potassium chloride  20 mEq Oral TID  . sodium chloride  10 mL Intravenous Q12H  . vitamin C  500 mg Oral BID  . Warfarin - Pharmacist Dosing Inpatient   Does not apply q1800   Infusions:    Assessment: 69 yo male with recent DVT is currently on therapeutic coumadin.  INR is down to 2.7 from 3.12.  Patient is also on imipenem since 09/06/15 for intra-abdominal infection with abscess.  Afebrile; UOP 0.4 ml/kg/hr  Goal of  Therapy:  INR 2-3; resolution of infection Monitor platelets by anticoagulation protocol: Yes   Plan:  - coumadin 5 mg po x1 Continue daily PT / INR Primaxin 500mg  IV Q6H  Breken Nazari, Tsz-Yin 09/10/2015,8:27 AM

## 2015-09-11 ENCOUNTER — Inpatient Hospital Stay (HOSPITAL_COMMUNITY): Payer: Medicare Other | Admitting: Occupational Therapy

## 2015-09-11 ENCOUNTER — Inpatient Hospital Stay (HOSPITAL_COMMUNITY): Payer: Medicare Other | Admitting: Physical Therapy

## 2015-09-11 ENCOUNTER — Ambulatory Visit (HOSPITAL_COMMUNITY): Payer: Medicare Other

## 2015-09-11 LAB — GLUCOSE, CAPILLARY
Glucose-Capillary: 84 mg/dL (ref 65–99)
Glucose-Capillary: 100 mg/dL — ABNORMAL HIGH (ref 65–99)
Glucose-Capillary: 104 mg/dL — ABNORMAL HIGH (ref 65–99)
Glucose-Capillary: 110 mg/dL — ABNORMAL HIGH (ref 65–99)

## 2015-09-11 LAB — PROTIME-INR
INR: 2.32 — AB (ref 0.00–1.49)
Prothrombin Time: 25.2 seconds — ABNORMAL HIGH (ref 11.6–15.2)

## 2015-09-11 MED ORDER — WARFARIN SODIUM 7.5 MG PO TABS
7.5000 mg | ORAL_TABLET | Freq: Once | ORAL | Status: AC
  Administered 2015-09-11: 7.5 mg via ORAL
  Filled 2015-09-11: qty 1

## 2015-09-11 NOTE — Progress Notes (Signed)
ANTICOAGULATION CONSULT NOTE - Follow Up Consult  Pharmacy Consult for coumadin Indication: DVT  Allergies  Allergen Reactions  . Unasyn [Ampicillin-Sulbactam Sodium] Hives, Itching and Rash    Diffuse rash not responding to Pepcid and Benadryl.  TDD.  Marland Kitchen. Levaquin [Levofloxacin] Nausea Only  . Penicillins Hives  . Doxycycline Rash    Patient Measurements: Height: 5\' 9"  (175.3 cm) Weight: 250 lb (113.399 kg) IBW/kg (Calculated) : 70.7 Heparin Dosing Weight:   Vital Signs: Temp: 97.4 F (36.3 C) (10/28 0500) Temp Source: Oral (10/28 0500) BP: 95/60 mmHg (10/28 0500) Pulse Rate: 95 (10/28 0500)  Labs:  Recent Labs  09/09/15 0500 09/10/15 0500 09/11/15 0455  LABPROT 31.5* 28.3* 25.2*  INR 3.12* 2.70* 2.32*    Estimated Creatinine Clearance: 106.7 mL/min (by C-G formula based on Cr of 0.37).   Medications:  Scheduled:  . antiseptic oral rinse  15 mL Mouth Rinse TID PC  . collagenase   Topical BID  . diclofenac sodium  2 g Topical QID  . famotidine  20 mg Oral BID  . feeding supplement (ENSURE ENLIVE)  237 mL Oral Q24H  . feeding supplement (PRO-STAT SUGAR FREE 64)  30 mL Oral TID  . furosemide  60 mg Oral Daily  . hydrocerin   Topical BID  . imipenem-cilastatin  500 mg Intravenous 4 times per day  . insulin aspart  0-5 Units Subcutaneous QHS  . insulin aspart  0-9 Units Subcutaneous TID WC  . magnesium oxide  800 mg Oral BID  . multivitamin with minerals  1 tablet Oral Daily  . potassium chloride  20 mEq Oral TID  . sodium chloride  10 mL Intravenous Q12H  . vitamin C  500 mg Oral BID  . Warfarin - Pharmacist Dosing Inpatient   Does not apply q1800   Infusions:    Assessment: 69 yo male with DVT is currently on therapeutic coumadin but INR is down to 2.32 from 2.7 (probably due to no dose few days ago).    Goal of Therapy:  INR 2-3 Monitor platelets by anticoagulation protocol: Yes   Plan:  - coumadin 7.5 mg po x1 Continue daily PT / INR  Shataria Crist,  Tsz-Yin 09/11/2015,8:48 AM

## 2015-09-11 NOTE — Progress Notes (Signed)
Occupational Therapy Weekly Progress Note  Patient Details  Name: Jonathan Le MRN: [id number] Date of Birth: 1946-04-20  Beginning of progress report period: September 04, 2015 End of progress report period: September 11, 2015  Today's Date: 09/11/2015 OT Individual Time: 1300-1404 OT Individual Time Calculation (min): 64 min    Patient has met 2 of 5 short term goals.  Jonathan Le continues to make slow progress with OT secondary to multiple medical issues as well as pain in his LB and sacrum.  He does continue to report less pain in sitting EOB and in wheelchair as well as tolerating sitting up for 1-4 hour intervals this week.  He still demonstrates decreased BUE shoulder AROM for UB bathing and dressing efficiency.  He is able to complete bathing with supervision but still needs min assist to lift his shirt up over his head.  Long handle sponge and reacher are being utilized for LB bathing tasks and for doffing socks however, he still needs max assist with removal of gripper socks secondary to having bilateral heel dressings and demonstrating decreased overall strength.  He is having to return to supine to perform peri washing and for donning LB clothing over his hips.  Total +2  assist needed for sliding board transfers as well.  Pt still with significant deficits in all areas of selfcare at this time.  Feel he will need transition to longer term rehab at expected discharge.  Will continue with current OT treatment plan of care until discharge situation is determined.    Patient continues to demonstrate the following deficits: decreased balance, decreased overall strength, increased sacral and back pain, decreased balance, and therefore will continue to benefit from skilled OT intervention to enhance overall performance with BADL.  Patient not progressing toward long term goals.  See goal revision..  Plan of care revisions: goals downgraded secondary to decreased progress.  OT Short Term  Goals Week 3:  OT Short Term Goal 1 (Week 3): Pt will donn pullover shirt EOB with close supervision. OT Short Term Goal 2 (Week 3): Pt will transition sidelying to sitting in order to complete seltcare tasks with mod assist 2 consecutive sessions OT Short Term Goal 3 (Week 3): Pt will perform sliding board transfer from bed to wheelchair with max assist. OT Short Term Goal 4 (Week 3): Pt will roll to the left with min assist and to the right with supervision during LB selfcare tasks.   Skilled Therapeutic Interventions/Progress Updates:    Pt worked on sit to stand transitions from EOB to start session.  Total +2 (pt 20%) for sit to stand from elevated EOB X 2 during session.  He was able to perform partial stand for 20 seconds and 30 seconds.  Pt with decreased full knee and hip extension in standing.  Utilized three muskateers technique for standing.  Completed sliding board transfer to the wheelchair with total assist +2 (pt 30%) to the wheelchair.  Rolled pt down to the therapy gym for rest of session.  Educated pt on dowel rod exercises for shoulder flexion,  shoulder adduction and abduction, and elbow flexion.  Issued light resistance yellow theraputty as well and theraputty exercises for gross grasp and thumb flexion.  Pt needs min facilitation for BUE shoulder flexion in sitting to approximately 90 degrees.  Hand out provided as well.  Pt returned to room and pressure relief completed prior to leaving pt in the room.  Call button and phone in reach with pt given ice  water.    Therapy Documentation Precautions:  Precautions Precautions: Fall Precaution Comments: continuous pulse ox, trach, O2, sacral decub with increased pain Restrictions Weight Bearing Restrictions: No Other Position/Activity Restrictions: trach collar  Pain: Pain Assessment Pain Assessment: Faces Faces Pain Scale: Hurts a little bit Pain Type: Acute pain Pain Location: Sacrum Pain Descriptors / Indicators:  Burning Pain Intervention(s): Repositioned;Ambulation/increased activity ADL: See Function Navigator for Current Functional Status.   Therapy/Group: Individual Therapy  Makalia Bare OTR/L 09/11/2015, 3:18 PM

## 2015-09-11 NOTE — Progress Notes (Signed)
Physical Therapy Session Note  Patient Details  Name: Jonathan Le MRN: [id number] Date of Birth: 01/12/1946  Today's Date: 09/11/2015 PT Co-Treatment Time: 1500 (30 min co-treat with PT Mandy)-1515 PT Co-Treatment Time Calculation (min): 15 min  Short Term Goals: Week 2:  PT Short Term Goal 1 (Week 2): Pt will perform w/c propulsion 50' with mod A PT Short Term Goal 2 (Week 2): Pt will sit EOB x 10 minutes with supervision for functional UE task PT Short Term Goal 3 (Week 2): Pt will transfer bed <> chair consistently with max A of 2  Skilled Therapeutic Interventions/Progress Updates:    Pt received seated in w/c with c/o pain as above and agreeable to treatment. W/c propulsion x100' with BUEs for increasing UE strength, aerobic endurance. Leaning R/L in w/c to place maxi sling. Transferred to tilt table with +2A and maximove. Pt tolerated 10 min at 50 degrees, requiring gradual increase in angle and minor adjustments for moving down on table for foot placement, LE positioning, strap position for comfort. Performed dynamic UE reaching activities. Pt c/o sacral pain and discomfort during transition to adjust on table. Pt with PT Mandy, on tilt table at completion of co-treat.   Therapy Documentation Precautions:  Precautions Precautions: Fall Precaution Comments: continuous pulse ox, trach, O2, sacral decub with increased pain Restrictions Weight Bearing Restrictions: No Other Position/Activity Restrictions: trach collar Pain: Pain Assessment Pain Assessment: 0-10 Pain Score: 8  Faces Pain Scale: Hurts a little bit Pain Type: Acute pain Pain Location: Sacrum Pain Orientation: Posterior Pain Descriptors / Indicators: Aching Pain Onset: On-going Patients Stated Pain Goal: 2 Pain Intervention(s): Repositioned Multiple Pain Sites: No   See Function Navigator for Current Functional Status.   Therapy/Group: Individual Therapy  [name] 09/11/2015, 4:12 PM

## 2015-09-11 NOTE — Progress Notes (Signed)
Subjective/Complaints: Looks in better spirits today, Working on ADLs                                                                                                                                                                                                                                                              ROS:   Denies CP, SOB, n/v/d.   Objective: Vital Signs: Blood pressure 100/57, pulse 98, temperature 98.2 F (36.8 C), temperature source Oral, resp. rate 22, height  (1.753 m), weight 113.399 kg (250 lb), SpO2 97 %. No results found. Results for orders placed or performed during the hospital encounter of 08/27/15 (from the past 72 hour(s))  Glucose, capillary     Status: Abnormal   Collection Time: 09/08/15  9:13 PM  Result Value Ref Range   Glucose-Capillary 115 (H) 65 - 99 mg/dL  Protime-INR     Status: Abnormal   Collection Time: 09/09/15  5:00 AM  Result Value Ref Range   Prothrombin Time 31.5 (H) 11.6 - 15.2 seconds   INR 3.12 (H) 0.00 - 1.49  Glucose, capillary     Status: None   Collection Time: 09/09/15  6:55 AM  Result Value Ref Range   Glucose-Capillary 96 65 - 99 mg/dL  Glucose, capillary     Status: Abnormal   Collection Time: 09/09/15 11:20 AM  Result Value Ref Range   Glucose-Capillary 123 (H) 65 - 99 mg/dL  Glucose, capillary     Status: Abnormal   Collection Time: 09/09/15  4:34 PM  Result Value Ref Range   Glucose-Capillary 118 (H) 65 - 99 mg/dL  Glucose, capillary     Status: Abnormal   Collection Time: 09/09/15  9:16 PM  Result Value Ref Range   Glucose-Capillary 103 (H) 65 - 99 mg/dL  Protime-INR     Status: Abnormal   Collection Time: 09/10/15  5:00 AM  Result Value Ref Range   Prothrombin Time 28.3 (H) 11.6 - 15.2 seconds   INR 2.70 (H) 0.00 - 1.49  Glucose, capillary     Status: None   Collection Time: 09/10/15  7:02 AM  Result Value Ref Range   Glucose-Capillary 84 65 - 99 mg/dL  Glucose, capillary     Status: Abnormal   Collection Time: 09/10/15 12:19 PM  Result Value Ref Range   Glucose-Capillary 117 (H) 65 - 99 mg/dL  Glucose, capillary     Status: Abnormal   Collection Time: 09/10/15  4:42 PM  Result Value Ref Range   Glucose-Capillary 132 (H) 65 - 99 mg/dL   Comment 1 Notify RN   Glucose, capillary     Status: Abnormal   Collection Time: 09/10/15  9:38 PM  Result Value Ref Range   Glucose-Capillary 125 (H) 65 - 99 mg/dL  Protime-INR     Status: Abnormal   Collection Time: 09/11/15  4:55 AM  Result Value Ref Range   Prothrombin Time 25.2 (H) 11.6 - 15.2 seconds   INR 2.32 (H) 0.00 - 1.49  Glucose, capillary     Status: None   Collection Time: 09/11/15  6:49 AM  Result Value Ref Range   Glucose-Capillary 84 65 - 99 mg/dL  Glucose, capillary     Status: Abnormal   Collection Time: 09/11/15 11:53 AM  Result Value Ref Range   Glucose-Capillary 100 (H) 65 - 99 mg/dL   Comment 1 Notify RN   Glucose, capillary     Status: Abnormal   Collection Time: 09/11/15  4:25 PM  Result Value Ref Range   Glucose-Capillary 104 (H) 65 - 99 mg/dL    Nursing note and vitals reviewed. Constitutional: He is oriented to person, place, and time.he is slower today He appears well-developed and well-nourished. No distress.  HENT:  Head: Normocephalic and atraumatic.  Eyes: Conjunctivae and EOM are normal.   Neck: Decreased range of motion present.  Trach with dry dressing in place.  Cardiovascular: Normal rate and regular rhythm. noES Respiratory: Effort normal and breath sounds normal. No respiratory distress. He has no wheezes.    GI: Soft. Bowel sounds are normal.  Non-tender. Midline incision has healed . Ostomy with bilious drainage. Multiple drains LLQ sutured in place.no erythema  Genitourinary:  Foley in place.  Musculoskeletal: He exhibits edema b/l LE  Right knee with effusion. No tenderness.  Skin: Left lower quadrant drains intact. Eschar left heel, right heel grade 1 Right lower shin  with stasis changes. Multiple ulcers on bilateral heels Diffuse macular rash Neurological: He is alert and oriented to person, place, and time.  Able to phonate well with PMSV. Speech clear. Follows basic commands without difficulty.  Psychiatric: He has a normal mood and affect. His speech is normal and behavior is normal. Cognition and memory are normal.  Strength Antigravity through, except b/l hip flexors.   Sensation diminished in feet.    Assessment/Plan: 1. 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 Physiatrist is providing close team supervision and 24 hour management of active medical problems listed below. Physiatrist and rehab team continue to assess barriers to discharge/monitor patient progress toward functional and medical goals. Patient may have drains removed if output diminishes over the next 3-5 days and CT scan shows resolution of abscess. Ask pulmonary to reevaluate next weekFor potential decannulation Should be done with hydrotherapy pretty soon and sacral wound can be managed with Wound VAC FIM: Function - Bathing Position: Sitting EOB Body parts bathed by patient: Right arm, Left arm, Chest, Abdomen, Right upper leg, Left upper leg, Right lower leg, Left lower leg (Pt performed bathing sitting EOB to supine for peri care) Body parts bathed by helper: Back, Buttocks Bathing not applicable: Right arm, Left arm, Chest,  Abdomen Assist Level: 2 helpers  Function- Upper Body Dressing/Undressing What is the patient wearing?: Pull over shirt/dress Pull over shirt/dress - Perfomed by patient: Thread/unthread right sleeve, Thread/unthread left sleeve, Pull shirt over trunk Pull over shirt/dress - Perfomed by helper: Put head through opening Assist Level: 2 helpers Function - Lower Body  Dressing/Undressing What is the patient wearing?: Non-skid slipper socks Position: Bed Underwear - Performed by helper: Thread/unthread right underwear leg, Thread/unthread left underwear leg, Pull underwear up/down Pants- Performed by helper: Pull pants up/down, Thread/unthread left pants leg, Thread/unthread right pants leg Non-skid slipper socks- Performed by helper: Don/doff right sock, Don/doff left sock Socks - Performed by helper: Don/doff right sock, Don/doff left sock Assist for footwear: Dependant Assist for lower body dressing: 2 Helpers  Function - Toileting Toileting activity did not occur: Safety/medical concerns Assist level: Two helpers (per RubyLeann, NT report)  Function - ArchivistToilet Transfers Toilet transfer activity did not occur: Safety/medical concerns  Function - Chair/bed transfer Chair/bed transfer activity did not occur: Safety/medical concerns Chair/bed transfer method: Other Chair/bed transfer assist level: 2 helpers Chair/bed transfer assistive device: Mechanical lift Mechanical lift: Maximove Chair/bed transfer details: Manual facilitation for placement, Verbal cues for technique, Verbal cues for precautions/safety, Verbal cues for safe use of DME/AE  Function - Locomotion: Wheelchair Will patient use wheelchair at discharge?: Yes Type: Manual Wheelchair activity did not occur: Safety/medical concerns Max wheelchair distance: 50' Assist Level: Touching or steadying assistance (Pt > 75%) Wheel 50 feet with 2 turns activity did not occur: Safety/medical concerns Assist Level: Touching or steadying assistance (Pt > 75%) Wheel 150 feet activity did not occur: Safety/medical concerns Turns around,maneuvers to table,bed, and toilet,negotiates 3% grade,maneuvers on rugs and over doorsills: No Function - Locomotion: Ambulation Ambulation activity did not occur: Safety/medical concerns Walk 10 feet activity did not occur: Safety/medical concerns Walk 50 feet with 2  turns activity did not occur: Safety/medical concerns Walk 150 feet activity did not occur: Safety/medical concerns Walk 10 feet on uneven surfaces activity did not occur: Safety/medical concerns  Function - Comprehension Comprehension: Auditory Comprehension assist level: Understands basic 75 - 89% of the time/ requires cueing 10 - 24% of the time  Function - Expression Expression: Verbal Expression assistive device: Talk trach valve Expression assist level: Expresses basic 90% of the time/requires cueing < 10% of the time.  Function - Social Interaction Social Interaction assist level: Interacts appropriately 90% of the time - Needs monitoring or encouragement for participation or interaction.  Function - Problem Solving Problem solving assist level: Solves basic 75 - 89% of the time/requires cueing 10 - 24% of the time  Function - Memory Memory assist level: Recognizes or recalls 50 - 74% of the time/requires cueing 25 - 49% of the time Patient normally able to recall (first 3 days only): Current season, Location of own room, Staff names and faces, That he or she is in a hospital  1. 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   2. LLE DVT treatment /Anticoagulation: Pharmaceutical: Coumadin, appreciate Pharmacy assistance.  supratherapeutic but no evidence of bleeding Urine now amber colored 3. Pain Management: Tylenol for prn for pain. Will continue oxycodone prn if needed for severe pain. 4. Mood: LCSW to follow for evaluation and support.  5. Neuropsych: This patient is capable of making decisions on his own behalf. 6. Skin/Wound Care:  appreciate.general surgery for sacral wound debridement, PRAFO ordered for heels,  Cont air bed , Once  Hydrotherapy is d/ced,  place wound VAC 7. Fluids/Electrolytes/Nutrition: Monitor I/O. Will consult dietician to  help with food choices. Increase prosource to TID. Added ensure between meals as unable to tolerate a lot of food at one time.   - Pt eating 100% of meals.  8. Abdominal abscess: Continue drain care bid.   ID on consult, Pt refused contrast has some improvement of at least one abscess with R drain removed, switched to imipenem, IR following drain output, repeat CT abd when output ~32ml/day 9. Hypokalemia: Improved. 4.0 on 10/17. Stable at 3.5 on 10/24 10. VDRF/ History of OSA: To continue current trach till mobility improves per CCM. Tolerating humidified air. We will need to reconsult pulmonary before decannulation,thus far patient not gaining much strength or endurance, We'll reevaluate next week 11. Anxiety disorder: Will resume low dose Xanax as used this for insomnia at home. given his respiratory compromise will need to limit dosing.  Improving.  12. Anemia: Likely due to infection/chronic illness. Hgb stable at 9.3 on 10/24.  Will cont to monitor.                     13. Anasarca: Improved Continue Lasix, protein supplements.  14. Diastolic CHF: Off amiodarone, Ace and statin at this time due to recurrent hypotension and multiple medical issues. Monitor for signs of overload. Check daily weights. Monitor BP bid.  15.  Hypomagnesemia-. Magnesium  BID, repeat Mg level on 10/24, normal at 1.7        LOS (Days) 15 A FACE TO FACE EVALUATION WAS PERFORMED Syrita Dovel E 09/11/2015, 5:43 PM

## 2015-09-11 NOTE — Progress Notes (Signed)
Occupational Therapy Session Note  Patient Details  Name: Jonathan Le MRN: [id number] Date of Birth: 10/04/1946  Today's Date: 09/11/2015 OT Individual Time: 0800-0900 OT Individual Time Calculation (min): 60 min    Short Term Goals: Week 2:  OT Short Term Goal 1 (Week 2): Pt will complete LB dressing at bed level with max assist OT Short Term Goal 2 (Week 2): Pt will donn pullover shirt EOB with close supervision. OT Short Term Goal 3 (Week 2): Pt will perform UB bathing sitting unsupported EOB with supervision, including reaching both upper shoulders.  OT Short Term Goal 4 (Week 2): Pt will transition sidelying to sitting in order to complete seltcare tasks with mod assist 2 consecutive sessions OT Short Term Goal 5 (Week 2): Pt will perform sliding board transfer from bed to wheelchair with max assist.  Skilled Therapeutic Interventions/Progress Updates:    Pt performed bathing in sitting to supine position during session.  Mod assist for rolling to the left side and min assist to the right using the bed rails.  Pt continues to need max instructional cueing to sequence through bed mobility as well.  Transitioned to sitting EOB with max assist to work on UB and parts of LB bathing.  Supervision for sitting balance while engaged in ADL.  Incorporated LH sponge for washing his feet and lower legs.  He was able to flex forward without use of AE to reach down to his ankles.  Oxygen sats on room air with trach during activity 95%.  Performed lateral leans side to side in sitting with max assist to remove pants for bathing as well.  Returned to supine for washing peri area after donning shirt in sitting.  Pt still needing min assist to lift the shirt over his head, but could perform all other aspects.  Total assist for thoroughness with peri washing this session.  Pt left in bed to finish eating breakfast as hydro therapy set to be completed at 9:30.    Therapy Documentation Precautions:   Precautions Precautions: Fall Precaution Comments: continuous pulse ox, trach, O2, sacral decub with increased pain Restrictions Weight Bearing Restrictions: No Other Position/Activity Restrictions: trach collar  Vital Signs: Therapy Vitals Pulse Rate: 93 Resp: 18 Patient Position (if appropriate): Lying Oxygen Therapy SpO2: 97 % O2 Device: Not Delivered Pulse Oximetry Type: Intermittent Pain: Pain Assessment Pain Assessment: No/denies pain ADL: See Function Navigator for Current Functional Status.   Therapy/Group: Individual Therapy  Marrisa Kimber OTR/L 09/11/2015, 12:13 PM

## 2015-09-11 NOTE — Progress Notes (Addendum)
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 Subcutaneous fat may be visible but bone, tendon or muscle are NOT exposed. (Active)  Dressing Type ABD;Barrier Film (skin prep);Gauze (Comment);Moist to dry;Tape dressing;Other (Comment) 09/11/2015  2:21 PM  Dressing Changed;Clean;Dry;Intact 09/11/2015  2:21 PM  Dressing Change Frequency Twice a day 09/11/2015  2:21 PM  State of Healing Early/partial granulation 09/11/2015  2:21 PM  Site / Wound Assessment Pink;Red;Yellow 09/11/2015  2:21 PM  % Wound base Red or Granulating 60% 09/11/2015  2:21 PM  % Wound base Yellow 40% 09/11/2015  2:21 PM  % Wound base Black 0% 09/11/2015  2:21 PM  % Wound base Other (Comment) 0% 09/11/2015  2:21 PM  Peri-wound Assessment Intact 09/11/2015  2:21 PM  Wound Length (cm) 7.9 cm 09/07/2015 10:13 AM  Wound Width (cm) 9.1 cm 09/07/2015 10:13 AM  Wound Depth (cm) 4.5 cm 09/07/2015 10:13 AM  Tunneling (cm) 5.6 at 12-1:00 o'clock 09/07/2015 10:13 AM  Margins Unattached edges (unapproximated) 09/11/2015  2:21 PM  Drainage Amount Moderate 09/11/2015  2:21 PM  Drainage Description Serosanguineous;No odor 09/11/2015  2:21 PM  Treatment Debridement ([facility]ive);Hydrotherapy (Pulse lavage);Packing (Saline gauze) 09/11/2015  2:21 PM   Santyl applied to wound bed prior to applying dressing.    Hydrotherapy Pulsed lavage therapy - wound location: sacrum/buttocks Pulsed Lavage with Suction (psi): 8 psi (4-8) Pulsed Lavage with Suction - Normal Saline Used: 1000 mL Pulsed Lavage Tip: Tip with splash  shield Selective Debridement Selective Debridement - Location: sacrum/buttocks Selective Debridement - Tools Used: Forceps;Scissors Selective Debridement - Tissue Removed: yellow necrotic tissue   Wound Assessment and Plan  Wound Therapy - Assess/Plan/Recommendations Wound Therapy - Clinical Statement: Wound continues to have less necrotic tissue and more granulation tissue. Wound Therapy - Functional Problem List: Pt with decr sitting tolerance due to sacral wound. Factors Delaying/Impairing Wound Healing: Immobility;Multiple medical problems;Polypharmacy Hydrotherapy Plan: Debridement;Dressing change;Patient/family education;Pulsatile lavage with suction Wound Therapy - Frequency: 6X / week Wound Therapy - Follow Up Recommendations: Home health RN Wound Plan: See above  Wound Therapy Goals- Improve the function of patient's integumentary system by progressing the wound(s) through the phases of wound healing (inflammation - proliferation - remodeling) by: Decrease Necrotic Tissue to: 10 Decrease Necrotic Tissue - Progress: Progressing toward goal Increase Granulation Tissue to: 90 Increase Granulation Tissue - Progress: Progressing toward goal  Goals will be updated until maximal potential achieved or discharge criteria met.  Discharge criteria: when goals achieved, discharge from hospital, MD decision/surgical intervention, no progress towards goals, refusal/missing three consecutive treatments without notification or medical reason.  GP     Nithya Meriweather 09/11/2015, 2:30 PM [facility] PT [phone]

## 2015-09-11 NOTE — Progress Notes (Signed)
Social Work Patient ID: Jonathan Le, male   DOB: 03-31-1946, 69 y.o.   MRN: [id number] Met with pt to discuss conversation with wife and the frustrations they both feel. His main concern is if he leaves here he will back slide and will not progress in [facility] or [facility]. He was at [facility] and they did not get him up out of bed there or that he could recall. Wife has checked into some of the NH and the concern is the trach and his sacral wound. He is trying really hard in therapies and was proud of the fact he stood for 20 seconds today. Will continue to provide support and MD feeling can remove drains next week. Will work toward 11/10 and see how he is doing and continue to work on a discharge plan also. Pt feels just an one more week extension would help him get back home.

## 2015-09-11 NOTE — Progress Notes (Signed)
Physical Therapy Session Note  Patient Details  Name: Jonathan HarpsHarold D Hammitt MRN: [id number] Date of Birth: 09/17/1946  Today's Date: 09/11/2015 PT Individual Time: 1515-1600  PT Individual Time Calculation (min): 15 minutes co-treat (30 minutes total) with Lizzie, PT; 30 minutes individual treatment   Short Term Goals: Week 2:  PT Short Term Goal 1 (Week 2): Pt will perform w/c propulsion 50' with mod A PT Short Term Goal 2 (Week 2): Pt will sit EOB x 10 minutes with supervision for functional UE task PT Short Term Goal 3 (Week 2): Pt will transfer bed <> chair consistently with max A of 2  Skilled Therapeutic Interventions/Progress Updates:    Pt received up in w/c - c/o sacral wound pain - agreeable to PT. W/C Management - pt self propels manual w/c x 50' with up to min A and repeated cues for redirection. Pt continues to require assist with w/c parts management, including direction of pull to lock brakes. Therapeutic Activity - Pt transferred with maxi-move w/c to/from tilt table and w/c to bed - dependently. Therapeutic Exercise - once on tilt table at 50 degrees tilt (pt refuses further tilt), PT instructs pt in unilateral arm flexion/reaching activity to PT's hands, with PT progressively increasing height of reach and progressing it to across pt's body. PT tries to get pt to complete isometric knee extension/quad activation, but pt not comprehending exercise or not believing he can do this. Therapeutic Activity - Once pt transferred back to bed, PT instructs pt in rolling multiple times with bedrail req verbal cues for sequencing and technique and up to min A to remove sling and doff pants. Pt ended with nurse in room to examine wound. Pt reports feeling as if he had a BM, but no bowel accident occurred due to pt's colostomy bag. Conference with OT reveals pt has had mucous discharge from rectum, recently. During PT session, pt reveals he has been avoiding taking all pain medication, but also reports  it is pain that is limiting him from increasing his activity/participation during PT. Pt agrees that he would be willing to take "extra strength" tylenol or motrin or aspirin, as a doctor recommends. PT left sticky note in Epic to notify physician of this. Continue per PT POC.   Therapy Documentation Precautions:  Precautions Precautions: Fall Precaution Comments: continuous pulse ox, trach, O2, sacral decub with increased pain Restrictions Weight Bearing Restrictions: No Other Position/Activity Restrictions: trach collar Pain: Pain Assessment Pain Assessment: 0-10 Pain Score: 8  Faces Pain Scale: Hurts a little bit Pain Type: Acute pain Pain Location: Sacrum Pain Orientation: Posterior Pain Descriptors / Indicators: Aching Pain Onset: On-going Patients Stated Pain Goal: 2 Pain Intervention(s): Repositioned Multiple Pain Sites: No   See Function Navigator for Current Functional Status.   Therapy/Group: Individual Therapy with co-treatment during first 30 minutes with [place], PT  [facility],[place] Sires M 09/11/2015, 4:14 PM

## 2015-09-11 NOTE — Plan of Care (Signed)
Problem: RH Toilet Transfers Goal: LTG Patient will perform toilet transfers w/assist (OT) LTG: Patient will perform toilet transfers with assist, with/without cues using equipment (OT)  Outcome: Not Applicable Date Met:  87/57/97 Pt with cathetor and colostomy at this time.   Problem: RH Tub/Shower Transfers Goal: LTG Patient will perform tub/shower transfers w/assist (OT) LTG: Patient will perform tub/shower transfers with assist, with/without cues using equipment (OT)  Outcome: Not Applicable Date Met:  28/20/60 Pt unable to shower secondary to wound.

## 2015-09-12 ENCOUNTER — Inpatient Hospital Stay (HOSPITAL_COMMUNITY): Payer: Medicare Other | Admitting: Physical Therapy

## 2015-09-12 ENCOUNTER — Inpatient Hospital Stay (HOSPITAL_COMMUNITY): Payer: Medicare Other | Admitting: Occupational Therapy

## 2015-09-12 LAB — GLUCOSE, CAPILLARY
GLUCOSE-CAPILLARY: 103 mg/dL — AB (ref 65–99)
GLUCOSE-CAPILLARY: 103 mg/dL — AB (ref 65–99)
Glucose-Capillary: 90 mg/dL (ref 65–99)
Glucose-Capillary: 90 mg/dL (ref 65–99)

## 2015-09-12 LAB — PROTIME-INR
INR: 2.46 — AB (ref 0.00–1.49)
PROTHROMBIN TIME: 26.4 s — AB (ref 11.6–15.2)

## 2015-09-12 MED ORDER — WARFARIN SODIUM 5 MG PO TABS
5.0000 mg | ORAL_TABLET | Freq: Once | ORAL | Status: AC
  Administered 2015-09-12: 5 mg via ORAL
  Filled 2015-09-12: qty 1

## 2015-09-12 NOTE — Progress Notes (Signed)
Subjective/Complaints: Had a good night. Has questions about what "serous fluid is and how do i evacuate it?"                                                                                                                                                                                                                                                             ROS:   Denies CP, SOB, n/v/d. Strength improving. No insomnia, anxiety, appetite ok, pain controlled  Objective: Vital Signs: Blood pressure 100/57, pulse 96, temperature 98.2 F (36.8 C), temperature source Oral, resp. rate 16, height 5\' 9"  (1.753 m), weight 113.399 kg (250 lb), SpO2 96 %. No results found. Results for orders placed or performed during the hospital encounter of 08/27/15 (from the past 72 hour(s))  Glucose, capillary     Status: Abnormal   Collection Time: 09/09/15 11:20 AM  Result Value Ref Range   Glucose-Capillary 123 (H) 65 - 99 mg/dL  Glucose, capillary     Status: Abnormal   Collection Time: 09/09/15  4:34 PM  Result Value Ref Range   Glucose-Capillary 118 (H) 65 - 99 mg/dL  Glucose, capillary     Status: Abnormal   Collection Time: 09/09/15  9:16 PM  Result Value Ref Range   Glucose-Capillary 103 (H) 65 - 99 mg/dL  Protime-INR     Status: Abnormal   Collection Time: 09/10/15  5:00 AM  Result Value Ref Range   Prothrombin Time 28.3 (H) 11.6 - 15.2 seconds   INR 2.70 (H) 0.00 - 1.49  Glucose, capillary     Status: None   Collection Time: 09/10/15  7:02 AM  Result Value Ref Range   Glucose-Capillary 84 65 - 99 mg/dL  Glucose, capillary     Status: Abnormal   Collection Time: 09/10/15 12:19 PM  Result Value Ref Range   Glucose-Capillary 117 (H) 65 - 99 mg/dL  Glucose, capillary     Status: Abnormal   Collection Time: 09/10/15  4:42 PM  Result Value Ref Range   Glucose-Capillary 132 (H) 65 - 99 mg/dL   Comment 1 Notify RN   Glucose, capillary     Status: Abnormal   Collection Time: 09/10/15  9:38 PM   Result Value Ref Range   Glucose-Capillary 125 (H) 65 - 99 mg/dL  Protime-INR     Status: Abnormal   Collection Time: 09/11/15  4:55 AM  Result Value Ref Range   Prothrombin Time 25.2 (H) 11.6 - 15.2 seconds   INR 2.32 (H) 0.00 - 1.49  Glucose, capillary     Status: None   Collection Time: 09/11/15  6:49 AM  Result Value Ref Range   Glucose-Capillary 84 65 - 99 mg/dL  Glucose, capillary     Status: Abnormal   Collection Time: 09/11/15 11:53 AM  Result Value Ref Range   Glucose-Capillary 100 (H) 65 - 99 mg/dL   Comment 1 Notify RN   Glucose, capillary     Status: Abnormal   Collection Time: 09/11/15  4:25 PM  Result Value Ref Range   Glucose-Capillary 104 (H) 65 - 99 mg/dL  Glucose, capillary     Status: Abnormal   Collection Time: 09/11/15  9:55 PM  Result Value Ref Range   Glucose-Capillary 110 (H) 65 - 99 mg/dL  Protime-INR     Status: Abnormal   Collection Time: 09/12/15  6:30 AM  Result Value Ref Range   Prothrombin Time 26.4 (H) 11.6 - 15.2 seconds   INR 2.46 (H) 0.00 - 1.49  Glucose, capillary     Status: None   Collection Time: 09/12/15  7:26 AM  Result Value Ref Range   Glucose-Capillary 90 65 - 99 mg/dL    Nursing note and vitals reviewed. Constitutional: He is oriented to person, place, and time.he is slower today He appears well-developed and well-nourished. No distress.  HENT:  Head: Normocephalic and atraumatic.  Eyes: Conjunctivae and EOM are normal.   Neck: Decreased range of motion present.  Trach with dry dressing in place. #6--able to speak when occludes with finger Cardiovascular: Normal rate and regular rhythm. noES Respiratory: Effort normal and breath sounds normal. No respiratory distress. He has no wheezes.    GI: Soft. Bowel sounds are normal.  Non-tender. Midline incision has healed . Ostomy with bilious drainage. Multiple drains LLQ sutured in place.no erythema  Genitourinary:  Foley in place.  Musculoskeletal: He exhibits edema  b/l LE  Right knee with effusion. No tenderness.  Skin: Left lower quadrant drains intact. Eschar left heel, right heel grade 1 Right lower shin with stasis changes. Multiple ulcers on bilateral heels Diffuse macular rash Neurological: He is alert and oriented to person, place, and time.  Able to phonate well with PMSV. Speech clear. Follows basic commands without difficulty.  Psychiatric: He has a normal mood and affect. His speech is normal and behavior is normal. Cognition and memory are normal.  Strength Antigravity through, except b/l hip flexors.   Sensation diminished in feet.    Assessment/Plan: 1. 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 Physiatrist is providing close team supervision and 24 hour management of active medical problems listed below. Physiatrist and rehab team continue to assess barriers to discharge/monitor patient progress toward functional and medical goals. Patient may have drains removed if output diminishes over the next 3-5 days and CT scan shows resolution of abscess. Ask pulmonary to reevaluate next weekFor potential decannulation Should be done with hydrotherapy pretty soon and sacral wound can be managed with Wound VAC FIM: Function - Bathing Position: Sitting EOB Body parts bathed by patient: Right arm, Left arm, Chest, Abdomen, Right upper leg, Left upper leg, Right lower leg, Left lower leg (Pt performed  bathing sitting EOB to supine for peri care) Body parts bathed by helper: Back, Buttocks Bathing not applicable: Right arm, Left arm, Chest, Abdomen Assist Level: 2 helpers  Function- Upper Body Dressing/Undressing What is the patient wearing?: Pull over shirt/dress Pull over shirt/dress - Perfomed by patient: Thread/unthread right sleeve, Thread/unthread left sleeve,  Pull shirt over trunk Pull over shirt/dress - Perfomed by helper: Put head through opening Assist Level: 2 helpers Function - Lower Body Dressing/Undressing What is the patient wearing?: Non-skid slipper socks Position: Bed Underwear - Performed by helper: Thread/unthread right underwear leg, Thread/unthread left underwear leg, Pull underwear up/down Pants- Performed by helper: Pull pants up/down, Thread/unthread left pants leg, Thread/unthread right pants leg Non-skid slipper socks- Performed by helper: Don/doff right sock, Don/doff left sock Socks - Performed by helper: Don/doff right sock, Don/doff left sock Assist for footwear: Dependant Assist for lower body dressing: 2 Helpers  Function - Toileting Toileting activity did not occur: Safety/medical concerns Assist level: Two helpers (per Aten, NT report)  Function - Archivist transfer activity did not occur: Safety/medical concerns  Function - Chair/bed transfer Chair/bed transfer activity did not occur: Safety/medical concerns Chair/bed transfer method: Other Chair/bed transfer assist level: 2 helpers Chair/bed transfer assistive device: Mechanical lift Mechanical lift: Maximove Chair/bed transfer details: Manual facilitation for placement, Verbal cues for technique, Verbal cues for precautions/safety, Verbal cues for safe use of DME/AE  Function - Locomotion: Wheelchair Will patient use wheelchair at discharge?: Yes Type: Manual Wheelchair activity did not occur: Safety/medical concerns Max wheelchair distance: 50' Assist Level: Touching or steadying assistance (Pt > 75%) Wheel 50 feet with 2 turns activity did not occur: Safety/medical concerns Assist Level: Touching or steadying assistance (Pt > 75%) Wheel 150 feet activity did not occur: Safety/medical concerns Turns around,maneuvers to table,bed, and toilet,negotiates 3% grade,maneuvers on rugs and over doorsills: No Function - Locomotion:  Ambulation Ambulation activity did not occur: Safety/medical concerns Walk 10 feet activity did not occur: Safety/medical concerns Walk 50 feet with 2 turns activity did not occur: Safety/medical concerns Walk 150 feet activity did not occur: Safety/medical concerns Walk 10 feet on uneven surfaces activity did not occur: Safety/medical concerns  Function - Comprehension Comprehension: Auditory Comprehension assist level: Understands basic 75 - 89% of the time/ requires cueing 10 - 24% of the time  Function - Expression Expression: Verbal Expression assistive device: Talk trach valve Expression assist level: Expresses basic 90% of the time/requires cueing < 10% of the time.  Function - Social Interaction Social Interaction assist level: Interacts appropriately 90% of the time - Needs monitoring or encouragement for participation or interaction.  Function - Problem Solving Problem solving assist level: Solves basic 75 - 89% of the time/requires cueing 10 - 24% of the time  Function - Memory Memory assist level: Recognizes or recalls 50 - 74% of the time/requires cueing 25 - 49% of the time Patient normally able to recall (first 3 days only): Current season, Location of own room, Staff names and faces, That he or she is in a hospital  1. 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    2. LLE DVT treatment /Anticoagulation: Pharmaceutical: Coumadin---therapeutic now 3. Pain Management: Tylenol for prn for pain. Will continue oxycodone prn if needed for severe pain. 4. Mood: LCSW to follow for evaluation and support.  5. Neuropsych: This patient is capable of making decisions on his own behalf. 6. Skin/Wound Care:  sacral wound debridement by surgery  - PRAFO ordered for heels, Cont air bed , Once  Hydrotherapy is d/ced,  place wound VAC  -reviewed concept of  serous drainage with patient 7. Fluids/Electrolytes/Nutrition: Monitor I/O. Will consult dietician to help with food choices. Increase prosource to TID. Added ensure between meals as unable to tolerate a lot of food at one time.   - Pt eating 100% of meals.  8. Abdominal abscess: Continue drain care bid.   ID on consult, Pt refused contrast has some improvement of at least one abscess with R drain removed, switched to imipenem, IR following drain output, repeat CT abd when output ~3310ml/day 9. Hypokalemia: Improved. 4.0 on 10/17. Stable at 3.5 on 10/24 10. VDRF/ History of OSA: To continue current trach till mobility improves per CCM. Tolerating humidified air. We will need to reconsult pulmonary before decannulation,thus far patient not gaining much strength or endurance, We'll reevaluate next week--probably could downsize to #4 in the meantime--will defer to primary rehab md 11. Anxiety disorder: Will resume low dose Xanax as used this for insomnia at home. given his respiratory compromise will need to limit dosing.  Improving.  12. Anemia: Likely due to infection/chronic illness. Hgb stable at 9.3 on 10/24.  Will cont to monitor.                     13. Anasarca: Improved Continue Lasix, protein supplements.  14. Diastolic CHF: Off amiodarone, Ace and statin at this time due to recurrent hypotension and multiple medical issues. Monitor for signs of overload. Weights stable.  15.  Hypomagnesemia-. Magnesium 800mg  BID, repeat Mg level on 10/24, normal at 1.7        LOS (Days) 16 A FACE TO FACE EVALUATION WAS PERFORMED SWARTZ,ZACHARY T 09/12/2015, 9:08 AM

## 2015-09-12 NOTE — Progress Notes (Addendum)
ANTICOAGULATION CONSULT NOTE - Follow Up Consult  Pharmacy Consult for coumadin Indication: DVT  Allergies  Allergen Reactions  . Unasyn [Ampicillin-Sulbactam Sodium] Hives, Itching and Rash    Diffuse rash not responding to Pepcid and Benadryl.  TDD.  Marland Kitchen. Levaquin [Levofloxacin] Nausea Only  . Penicillins Hives  . Doxycycline Rash    Patient Measurements: Height: 5\' 9"  (175.3 cm) Weight: 250 lb (113.399 kg) IBW/kg (Calculated) : 70.7  Vital Signs: Pulse Rate: 92 (10/29 0354)  Labs:  Recent Labs  09/10/15 0500 09/11/15 0455 09/12/15 0630  LABPROT 28.3* 25.2* 26.4*  INR 2.70* 2.32* 2.46*    Estimated Creatinine Clearance: 106.7 mL/min (by C-G formula based on Cr of 0.37).   Medications:  Scheduled:  . antiseptic oral rinse  15 mL Mouth Rinse TID PC  . collagenase   Topical BID  . diclofenac sodium  2 g Topical QID  . famotidine  20 mg Oral BID  . feeding supplement (ENSURE ENLIVE)  237 mL Oral Q24H  . feeding supplement (PRO-STAT SUGAR FREE 64)  30 mL Oral TID  . furosemide  60 mg Oral Daily  . hydrocerin   Topical BID  . imipenem-cilastatin  500 mg Intravenous 4 times per day  . insulin aspart  0-5 Units Subcutaneous QHS  . insulin aspart  0-9 Units Subcutaneous TID WC  . magnesium oxide  800 mg Oral BID  . multivitamin with minerals  1 tablet Oral Daily  . potassium chloride  20 mEq Oral TID  . sodium chloride  10 mL Intravenous Q12H  . vitamin C  500 mg Oral BID  . Warfarin - Pharmacist Dosing Inpatient   Does not apply q1800   Infusions:    Assessment: 69 yo male with DVT is currently on therapeutic coumadin but INR 2.46, hgb 9.3 and plt wnl (10/24), no bleeding noted  Goal of Therapy:  INR 2-3 Monitor platelets by anticoagulation protocol: Yes   Plan:  Warfarin 5 mg  x1 Continue daily PT / INR  Remi HaggardAlyson N. Herndon Grill, PharmD Clinical Pharmacist- Resident Pager: [phone]  Remi HaggardAlyson N Zeke Aker 09/12/2015,7:51 AM

## 2015-09-12 NOTE — Progress Notes (Signed)
Physical Therapy Session Note  Patient Details  Name: Jonathan Le MRN: [id number] Date of Birth: 07/12/1946  Today's Date: 09/12/2015 PT Individual Time: 1100-1200 Treatment Session 2: 1500-1530 PT Individual Time Calculation (min): 60 min Treatment Session 2: 30 min  Short Term Goals: Week 2:  PT Short Term Goal 1 (Week 2): Pt will perform w/c propulsion 50' with mod A PT Short Term Goal 2 (Week 2): Pt will sit EOB x 10 minutes with supervision for functional UE task PT Short Term Goal 3 (Week 2): Pt will transfer bed <> chair consistently with max A of 2  Skilled Therapeutic Interventions/Progress Updates:    Treatment Session 1: Pt received in bed, agreeable to PT without pants on. Therapeutic Activity - PT convinces pt to wear a brief to collect any discharge from rectum, which has been occuring. PT and Tech place brief and then shorts as pt rolls multiple times with min A using bedrail, but req +2 for LB dressing. Pt req min A x 2 for R side lie to sit transfer with bedrail. PT sets step under pt's feet and places slideboard under draw sheet, instructs pt in head-hips relationship for level scoot transfer, and pt completes with min A from PT and +2 assist to stabilize w/c only. After ace wrapping pt's B knees per his request once in gym, PT and Tech complete sit to stand x 7 reps total while in front of side of // bar req max A x 2 with pt maintaining standing for only 5-10 seconds on best trial. W/C Management - pt self propels manual w/c x 100' with Supervision and repeated cues for attention to task and significant amount of increased time (including 2 turns). Pt ended up in w/c with lunch tray in front and all needs in reach. Pt demonstrates a dependent personality, with little day to day carryover of sequencing steps related to functional mobility. Pt reports that rolling, sitting up in bed, and slideboard transfer went the best, today. PT explains that this is likely due to pt taking  his pain medication prior to the slideboard transfer and encourages him to continue to take pain medicine as his doctor has prescribed.   Treatment Session 2: Pt received in bed with family present - sister reporting pt had just completed leg exercises that PT [name] had left for pt to work on on his own. Therapeutic Exercise - PT instructs pt in different B LE AROM and strengthening exercises to target other muscle groups: AAROM SLR with focus on trying to hold while in the air, hip IR in B hook lie - stretch with legs open and strengthening while bringing legs together, attempted bridges with PT stabilizing ankles to bed, clam shells AAROM, side lie hip abduction AAROM (minimal leg movement without assist from PT) x 10 reps each. Pt ended comfortable in bed with all needs in reach. Continue per PT POC.    Therapy Documentation Precautions:  Precautions Precautions: Fall Precaution Comments: sacral decub, trach, drain lines from abdomen Restrictions Weight Bearing Restrictions: No Other Position/Activity Restrictions: trach collar Pain: Pain Assessment Pain Assessment: Faces Pain Score: 6  Faces Pain Scale: Hurts a little bit Pain Type: Acute pain Pain Location: Back Pain Orientation: Lower Pain Descriptors / Indicators: Aching Pain Frequency: Intermittent Pain Onset: On-going Patients Stated Pain Goal: 2 Pain Intervention(s): Repositioned;Emotional support Multiple Pain Sites: No Treatment Session 2: Pt c/o 8/10 pain at rest in sacrum - PT offers distraction and emotional support to decrease pt's  pain.   See Function Navigator for Current Functional Status.   Therapy/Group: Individual Therapy and with PT Tech a +2 for AM tx  [facility] M 09/12/2015, 12:28 PM

## 2015-09-12 NOTE — Progress Notes (Signed)
Occupational Therapy Session Note  Patient Details  Name: Jonathan Le MRN: [id number] Date of Birth: 10/27/1946  Today's Date: 09/12/2015 OT Individual Time: [id number] OT Individual Time Calculation (min): 58 min    Short Term Goals: Week 3:  OT Short Term Goal 1 (Week 3): Pt will donn pullover shirt EOB with close supervision. OT Short Term Goal 2 (Week 3): Pt will transition sidelying to sitting in order to complete seltcare tasks with mod assist 2 consecutive sessions OT Short Term Goal 3 (Week 3): Pt will perform sliding board transfer from bed to wheelchair with max assist. OT Short Term Goal 4 (Week 3): Pt will roll to the left with min assist and to the right with supervision during LB selfcare tasks.   Skilled Therapeutic Interventions/Progress Updates:    Pt worked on bathing sitting EOB this session.  Rolling to the right of the bed with min instructional cueing for sequencing and supervision.  Still needs min assist for rolling to the left side of the bed.  Max assist for transition to sitting from right sidelying.  Once sitting pt was able to use the reacher with increased time to remove his gripper socks and then used the LH sponge for washing his feet.  He was able to complete all UB bathing and donning pullover shirt this session with supervision including placing it over his head and pulling it down in the back.  Practiced donning shorts over his feet using the reacher with mod instructional cueing but did not pull up over hips secondary to pt having hydrotherapy after OT session.  Returned back to supine with mod assist for lifting LEs into the bed.  Therapist assisted with peri washing for thoroughness from this position.    Therapy Documentation Precautions:  Precautions Precautions: Fall Precaution Comments: sacral decub, trach, drain lines from abdomen Restrictions Weight Bearing Restrictions: No Other Position/Activity Restrictions: trach collar  Vital  Signs: Therapy Vitals Pulse Rate: 96 Resp: 16 Oxygen Therapy SpO2: 96 % O2 Device: Not Delivered Pain: Pain Assessment Pain Assessment: Faces Pain Score: 6  Faces Pain Scale: Hurts a little bit Pain Type: Acute pain Pain Location: Back Pain Orientation: Lower Pain Descriptors / Indicators: Aching Pain Frequency: Intermittent Pain Onset: On-going Patients Stated Pain Goal: 2 Pain Intervention(s): Repositioned;Emotional support Multiple Pain Sites: No ADL: See Function Navigator for Current Functional Status.   Therapy/Group: Individual Therapy  Merary Garguilo OTR/L 09/12/2015, 12:25 PM

## 2015-09-12 NOTE — Progress Notes (Signed)
Physical Therapy Wound Treatment Patient Details  Name: Lawyer D Stiver MRN: [id number] Date of Birth: 12/19/1945  Today's Date: 09/12/2015 Time: 10:04-10:40   Subjective  Subjective: No new complaints. Just finished OT session. Denies any pain at this time. Patient and Family Stated Goals: Get better Date of Onset:  (prior to this hospitialization) Prior Treatments: I&D, dressing changes  Pain Score: No complaints before session. Reported pain/tenderness at some areas during selective debridement. No complaints after session.  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 Subcutaneous fat may be visible but bone, tendon or muscle are NOT exposed. (Active)  Dressing Type Gauze (Comment);Moist to dry;Tape dressing;Other (Comment) 09/12/2015 10:00 AM  Dressing Changed;Clean;Dry;Intact 09/12/2015 10:00 AM  Dressing Change Frequency Twice a day 09/12/2015 10:00 AM  State of Healing Early/partial granulation 09/12/2015 10:00 AM  Site / Wound Assessment Pink;Red;Yellow 09/12/2015 10:00 AM  % Wound base Red or Granulating 60% 09/12/2015 10:00 AM  % Wound base Yellow 40% 09/12/2015 10:00 AM  % Wound base Black 0% 09/12/2015 10:00 AM  % Wound base Other (Comment) 0% 09/12/2015 10:00 AM  Peri-wound Assessment Intact 09/12/2015 10:00 AM  Wound Length (cm) 7.9 cm 09/07/2015 10:13 AM  Wound Width (cm) 9.1 cm 09/07/2015 10:13 AM  Wound Depth (cm) 4.5 cm 09/07/2015 10:13 AM  Tunneling (cm) 5.6 at 12-1:00 o'clock 09/07/2015 10:13 AM  Margins Unattached edges (unapproximated) 09/12/2015 10:00 AM  Drainage Amount Moderate 09/12/2015 10:00 AM  Drainage Description Serosanguineous 09/12/2015 10:00 AM  Treatment Debridement (Selective);Hydrotherapy (Pulse lavage);Packing (Saline gauze);Tape changed 09/12/2015 10:00 AM   Hydrotherapy Pulsed lavage therapy - wound location: sacrum/buttocks Pulsed Lavage with Suction (psi): 8 psi (4-8 psi) Pulsed Lavage with  Suction - Normal Saline Used: 1000 mL Pulsed Lavage Tip: Tip with splash shield Selective Debridement Selective Debridement - Location: sacrum/buttocks Selective Debridement - Tools Used: Forceps;Scissors Selective Debridement - Tissue Removed: yellow necrotic tissue   Wound Assessment and Plan  Wound Therapy - Assess/Plan/Recommendations Wound Therapy - Clinical Statement: Necrotic tissue appears to be decreasing with treatment. Pt tender with treatment in areas with necrotic tissues during selective debridement. Progressing toward goals.                                         Wound Therapy - Functional Problem List: Pt with decr sitting tolerance due to sacral wound. Factors Delaying/Impairing Wound Healing: Immobility;Multiple medical problems;Polypharmacy Hydrotherapy Plan: Debridement;Dressing change;Patient/family education;Pulsatile lavage with suction Wound Therapy - Frequency: 6X / week Wound Therapy - Follow Up Recommendations: Home health RN Wound Plan: See above  Wound Therapy Goals- Improve the function of patient's integumentary system by progressing the wound(s) through the phases of wound healing (inflammation - proliferation - remodeling) by: Decrease Necrotic Tissue to: 10 Decrease Necrotic Tissue - Progress: Progressing toward goal Increase Granulation Tissue to: 90 Increase Granulation Tissue - Progress: Progressing toward goal Goals/treatment plan/discharge plan were made with and agreed upon by patient/family: Yes Time For Goal Achievement: 7 days Wound Therapy - Potential for Goals: Good  Goals will be updated until maximal potential achieved or discharge criteria met.  Discharge criteria: when goals achieved, discharge from hospital, MD decision/surgical intervention, no progress towards goals, refusal/missing three consecutive treatments without notification or medical reason.   ,  09/12/2015, 10:47 AM   , PTA, CLT Acute Rehab  Services Office- [phone] 09/12/15, 10:49 AM   

## 2015-09-13 ENCOUNTER — Inpatient Hospital Stay (HOSPITAL_COMMUNITY): Payer: Medicare Other | Admitting: Physical Therapy

## 2015-09-13 ENCOUNTER — Inpatient Hospital Stay (HOSPITAL_COMMUNITY): Payer: Medicare Other | Admitting: Occupational Therapy

## 2015-09-13 LAB — BASIC METABOLIC PANEL
Anion gap: 6 (ref 5–15)
BUN: 7 mg/dL (ref 6–20)
CHLORIDE: 99 mmol/L — AB (ref 101–111)
CO2: 29 mmol/L (ref 22–32)
Calcium: 8 mg/dL — ABNORMAL LOW (ref 8.9–10.3)
Creatinine, Ser: 0.35 mg/dL — ABNORMAL LOW (ref 0.61–1.24)
GFR calc Af Amer: >60 mL/min (ref >60)
GLUCOSE: 87 mg/dL (ref 65–99)
POTASSIUM: 3.6 mmol/L (ref 3.5–5.1)
Sodium: 134 mmol/L — ABNORMAL LOW (ref 135–145)

## 2015-09-13 LAB — GLUCOSE, CAPILLARY
GLUCOSE-CAPILLARY: 90 mg/dL (ref 65–99)
GLUCOSE-CAPILLARY: 97 mg/dL (ref 65–99)
Glucose-Capillary: 104 mg/dL — ABNORMAL HIGH (ref 65–99)
Glucose-Capillary: 101 mg/dL — ABNORMAL HIGH (ref 65–99)

## 2015-09-13 LAB — PROTIME-INR
INR: 2.65 — AB (ref 0.00–1.49)
Prothrombin Time: 27.9 seconds — ABNORMAL HIGH (ref 11.6–15.2)

## 2015-09-13 MED ORDER — WARFARIN SODIUM 5 MG PO TABS
5.0000 mg | ORAL_TABLET | Freq: Once | ORAL | Status: AC
  Administered 2015-09-13: 5 mg via ORAL
  Filled 2015-09-13: qty 1

## 2015-09-13 MED ORDER — OXYCODONE HCL 5 MG PO TABS
2.5000 mg | ORAL_TABLET | Freq: Every day | ORAL | Status: DC
  Filled 2015-09-13 (×4): qty 1

## 2015-09-13 NOTE — Progress Notes (Signed)
Subjective/Complaints: Did better yesterday when given pain medication before therapy. Slept well last night. Asked if i would downsize his trach today.                                                                                                                                                                                                            ROS:   Denies CP, SOB, n/v/d. Strength improving. No insomnia, anxiety, appetite ok, pain controlled  Objective: Vital Signs: Blood pressure 101/56, pulse 92, temperature 97.5 F (36.4 C), temperature source Oral, resp. rate 17, height _0  (1.753 m), weight 108.954 kg (240 lb 3.2 oz), SpO2 99 %. No results found. Results for orders placed or performed during the hospital encounter of 08/27/15 (from the past 72 hour(s))  Glucose, capillary     Status: Abnormal   Collection Time: 09/10/15 12:19 PM  Result Value Ref Range   Glucose-Capillary 117 (H) 65 - 99 mg/dL  Glucose, capillary     Status: Abnormal   Collection Time: 09/10/15  4:42 PM  Result Value Ref Range   Glucose-Capillary 132 (H) 65 - 99 mg/dL   Comment 1 Notify RN   Glucose, capillary     Status: Abnormal   Collection Time: 09/10/15  9:38 PM  Result Value Ref Range   Glucose-Capillary 125 (H) 65 - 99 mg/dL  Protime-INR     Status: Abnormal   Collection Time: 09/11/15  4:55 AM  Result Value Ref Range   Prothrombin Time 25.2 (H) 11.6 - 15.2 seconds   INR 2.32 (H) 0.00 - 1.49  Glucose, capillary     Status: None   Collection Time: 09/11/15  6:49 AM  Result Value Ref Range   Glucose-Capillary 84 65 - 99 mg/dL  Glucose, capillary     Status: Abnormal   Collection Time: 09/11/15 11:53 AM  Result Value Ref Range   Glucose-Capillary 100 (H) 65 - 99 mg/dL   Comment 1 Notify RN   Glucose, capillary     Status: Abnormal   Collection Time: 09/11/15  4:25 PM  Result Value Ref Range   Glucose-Capillary 104 (H) 65 - 99 mg/dL  Glucose, capillary     Status: Abnormal   Collection  Time: 09/11/15  9:55 PM  Result Value Ref Range   Glucose-Capillary 110 (H) 65 - 99 mg/dL  Protime-INR     Status: Abnormal   Collection Time: 09/12/15  6:30 AM  Result Value Ref Range   Prothrombin Time 26.4 (H) 11.6 - 15.2 seconds   INR 2.46 (H)  0.00 - 1.49  Glucose, capillary     Status: None   Collection Time: 09/12/15  7:26 AM  Result Value Ref Range   Glucose-Capillary 90 65 - 99 mg/dL  Glucose, capillary     Status: Abnormal   Collection Time: 09/12/15 12:04 PM  Result Value Ref Range   Glucose-Capillary 103 (H) 65 - 99 mg/dL  Glucose, capillary     Status: Abnormal   Collection Time: 09/12/15  4:25 PM  Result Value Ref Range   Glucose-Capillary 103 (H) 65 - 99 mg/dL  Glucose, capillary     Status: None   Collection Time: 09/12/15  9:27 PM  Result Value Ref Range   Glucose-Capillary 90 65 - 99 mg/dL  Protime-INR     Status: Abnormal   Collection Time: 09/13/15  5:15 AM  Result Value Ref Range   Prothrombin Time 27.9 (H) 11.6 - 15.2 seconds   INR 2.65 (H) 0.00 - 4.25  Basic metabolic panel     Status: Abnormal   Collection Time: 09/13/15  5:15 AM  Result Value Ref Range   Sodium 134 (L) 135 - 145 mmol/L   Potassium 3.6 3.5 - 5.1 mmol/L   Chloride 99 (L) 101 - 111 mmol/L   CO2 29 22 - 32 mmol/L   Glucose, Bld 87 65 - 99 mg/dL   BUN 7 6 - 20 mg/dL   Creatinine, Ser 0.35 (L) 0.61 - 1.24 mg/dL   Calcium 8.0 (L) 8.9 - 10.3 mg/dL   GFR calc non Af Amer >60 >60 mL/min   GFR calc Af Amer >60 >60 mL/min    Comment: (NOTE) The eGFR has been calculated using the CKD EPI equation. This calculation has not been validated in all clinical situations. eGFR's persistently <60 mL/min signify possible Chronic Kidney Disease.    Anion gap 6 5 - 15  Glucose, capillary     Status: None   Collection Time: 09/13/15  7:02 AM  Result Value Ref Range   Glucose-Capillary 90 65 - 99 mg/dL    Nursing note and vitals reviewed. Constitutional: He is oriented to person, place, and time.he  is slower today He appears well-developed and well-nourished. No distress.  HENT:  Head: Normocephalic and atraumatic.  Eyes: Conjunctivae and EOM are normal.   Neck: Decreased range of motion present.  Trach with dry dressing in place. #6--able to speak with PMV Cardiovascular: Normal rate and regular rhythm. noES Respiratory: Effort normal and breath sounds normal. No respiratory distress. He has no wheezes.    GI: Soft. Bowel sounds are normal.  Non-tender. Midline incision has healed . Ostomy with bilious drainage which appears decreased. Multiple drains LLQ sutured in place.no erythema  Genitourinary:  Foley in place.  Musculoskeletal: He exhibits edema b/l LE  Right knee with effusion. No tenderness.  Skin: Left lower quadrant drains intact. Eschar left heel, right heel grade 1 Right lower shin with stasis changes. Multiple ulcers on bilateral heels Diffuse macular rash Neurological: He is alert and oriented to person, place, and time.    Speech clear. Follows basic commands without difficulty.  Psychiatric: He has a normal mood and affect. His speech is normal and behavior is normal. Cognition and memory are normal.  Strength Antigravity through, except b/l hip flexors.   Sensation diminished in feet.    Assessment/Plan: 1. 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  Physiatrist is providing close team supervision and 24 hour management of active medical problems listed below. Physiatrist and rehab team continue to assess barriers to discharge/monitor patient progress toward functional and medical goals. Patient may have drains removed if output diminishes over the next 3-5 days and CT scan shows resolution of abscess. Ask pulmonary to reevaluate next weekFor potential decannulation Should  be done with hydrotherapy pretty soon and sacral wound can be managed with Wound VAC FIM: Function - Bathing Position: Sitting EOB Body parts bathed by patient: Right arm, Left arm, Chest, Abdomen, Right upper leg, Left upper leg, Right lower leg, Left lower leg Body parts bathed by helper: Front perineal area, Buttocks, Back Bathing not applicable: Right arm, Left arm, Chest, Abdomen Assist Level: 2 helpers  Function- Upper Body Dressing/Undressing What is the patient wearing?: Pull over shirt/dress Pull over shirt/dress - Perfomed by patient: Thread/unthread right sleeve, Thread/unthread left sleeve, Pull shirt over trunk, Put head through opening Pull over shirt/dress - Perfomed by helper: Put head through opening Assist Level: Supervision or verbal cues Function - Lower Body Dressing/Undressing What is the patient wearing?: Non-skid slipper socks, Pants Position: Bed Underwear - Performed by helper: Thread/unthread right underwear leg, Thread/unthread left underwear leg, Pull underwear up/down Pants- Performed by patient: Thread/unthread right pants leg, Thread/unthread left pants leg Pants- Performed by helper: Pull pants up/down Non-skid slipper socks- Performed by patient: Don/doff right sock, Don/doff left sock (Pt doffed gripper socks with use of the reacher but therapist had to put them back on after washing and applying cream to his feet.) Non-skid slipper socks- Performed by helper: Don/doff right sock, Don/doff left sock Socks - Performed by helper: Don/doff right sock, Don/doff left sock Assist for footwear: Dependant Assist for lower body dressing: 2 Helpers  Function - Toileting Toileting activity did not occur: Safety/medical concerns Assist level: Two helpers (per [place], NT report)  Function - Air cabin crew transfer activity did not occur: Safety/medical concerns  Function - Chair/bed transfer Chair/bed transfer activity did not occur: Safety/medical  concerns Chair/bed transfer method: Lateral scoot Chair/bed transfer assist level: 2 helpers (min A x 1; 2nd assist to stabilize w/c) Chair/bed transfer assistive device: Armrests, Sliding board, Other (step under feet) Mechanical lift: Maximove Chair/bed transfer details: Verbal cues for safe use of DME/AE, Verbal cues for sequencing, Verbal cues for technique, Verbal cues for precautions/safety, Manual facilitation for weight shifting, Manual facilitation for placement  Function - Locomotion: Wheelchair Will patient use wheelchair at discharge?: Yes Type: Manual Wheelchair activity did not occur: Safety/medical concerns Max wheelchair distance: 100' Assist Level: Supervision or verbal cues Wheel 50 feet with 2 turns activity did not occur: Safety/medical concerns Assist Level: Supervision or verbal cues Wheel 150 feet activity did not occur: Safety/medical concerns Turns around,maneuvers to table,bed, and toilet,negotiates 3% grade,maneuvers on rugs and over doorsills: No Function - Locomotion: Ambulation Ambulation activity did not occur: Safety/medical concerns Walk 10 feet activity did not occur: Safety/medical concerns Walk 50 feet with 2 turns activity did not occur: Safety/medical concerns Walk 150 feet activity did not occur: Safety/medical concerns Walk 10 feet on uneven surfaces activity did not occur: Safety/medical concerns  Function - Comprehension Comprehension: Auditory Comprehension assist level: Understands complex 90% of the time/cues 10% of the time  Function - Expression Expression: Verbal Expression assistive device: Talk trach valve Expression assist level: Expresses complex 90% of the time/cues < 10% of the time  Function - Social Interaction Social Interaction assist level: Interacts appropriately 90% of the time -  Needs monitoring or encouragement for participation or interaction.  Function - Problem Solving Problem solving assist level: Solves basic 75  - 89% of the time/requires cueing 10 - 24% of the time  Function - Memory Memory assist level: Recognizes or recalls 90% of the time/requires cueing < 10% of the time Patient normally able to recall (first 3 days only): Current season, Location of own room, Staff names and faces, That he or she is in a hospital  1. 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    2. LLE DVT treatment /Anticoagulation: Pharmaceutical: Coumadin---therapeutic   3. Pain Management: Tylenol for prn for pain. Will continue oxycodone prn if needed for severe pain.  -would benefit from scheduled oxy prior to am therapies---will schedule one hour ahead 4. Mood: LCSW to follow for evaluation and support.  5. Neuropsych: This patient is capable of making decisions on his own behalf. 6. Skin/Wound Care:   sacral wound debridement by surgery  - PRAFO ordered for heels, Cont air bed , Once  Hydrotherapy is d/ced,  place wound VAC  -reviewed concept of serous drainage with patient 7. Fluids/Electrolytes/Nutrition: Monitor I/O. Will consult dietician to help with food choices. Increase prosource to TID. Added ensure between meals as unable to tolerate a lot of food at one time.   - Pt eating 100% of meals.  8. Abdominal abscess: Continue drain care bid.   ID on consult, Pt refused contrast has some improvement of at least one abscess with R drain removed, switched to imipenem, IR following drain output, repeat CT abd when output ~42m/day 9. Hypokalemia: Stable at 3.6 today. I personally reviewed the patient's labs today.  10. VDRF/ History of OSA: To continue current trach till mobility improves per CCM. Tolerating humidified air. We will need to reconsult pulmonary before decannulation,thus far patient not gaining much strength or endurance, We'll reevaluate next week--probably could downsize  to #4 in the meantime--will defer to primary rehab md tomorrow 11. Anxiety disorder: resumed low dose Xanax as used this for insomnia at home. given his respiratory compromise will need to limit dosing.  Improving.  12. Anemia: Likely due to infection/chronic illness. Hgb stable at 9.3 on 10/24.  Will cont to monitor.                      13. Anasarca: Improved Continue Lasix, protein supplements.  14. Diastolic CHF: Off amiodarone, Ace and statin at this time due to recurrent hypotension and multiple medical issues. Monitor for signs of overload. Weights stable.  15.  Hypomagnesemia-. Magnesium 8045mBID, repeat Mg level on 10/24, normal at 1.7         LOS (Days) 17 A FACE TO FACE EVALUATION WAS PERFORMED SWARTZ,ZACHARY T 09/13/2015, 9:42 AM

## 2015-09-13 NOTE — Progress Notes (Addendum)
ANTICOAGULATION/Antibiotic CONSULT NOTE - Follow Up Consult  Pharmacy Consult for coumadin and imipenem Indication: DVT and intra-abdominal infection, respectively  Allergies  Allergen Reactions  . Unasyn [Ampicillin-Sulbactam Sodium] Hives, Itching and Rash    Diffuse rash not responding to Pepcid and Benadryl.  TDD.  Marland Kitchen. Levaquin [Levofloxacin] Nausea Only  . Penicillins Hives  . Doxycycline Rash    Patient Measurements: Height: 5\' 9"  (175.3 cm) Weight: 240 lb 3.2 oz (108.954 kg) IBW/kg (Calculated) : 70.7  Vital Signs: Temp: 97.5 F (36.4 C) (10/30 0529) Temp Source: Oral (10/30 0529) BP: 101/56 mmHg (10/30 0529) Pulse Rate: 92 (10/30 0529)  Labs:  Recent Labs  09/11/15 0455 09/12/15 0630 09/13/15 0515  LABPROT 25.2* 26.4* 27.9*  INR 2.32* 2.46* 2.65*  CREATININE  --   --  0.35*    Estimated Creatinine Clearance: 104.5 mL/min (by C-G formula based on Cr of 0.35).   Medications:  Scheduled:  . antiseptic oral rinse  15 mL Mouth Rinse TID PC  . collagenase   Topical BID  . diclofenac sodium  2 g Topical QID  . famotidine  20 mg Oral BID  . feeding supplement (ENSURE ENLIVE)  237 mL Oral Q24H  . feeding supplement (PRO-STAT SUGAR FREE 64)  30 mL Oral TID  . furosemide  60 mg Oral Daily  . hydrocerin   Topical BID  . imipenem-cilastatin  500 mg Intravenous 4 times per day  . insulin aspart  0-5 Units Subcutaneous QHS  . insulin aspart  0-9 Units Subcutaneous TID WC  . magnesium oxide  800 mg Oral BID  . multivitamin with minerals  1 tablet Oral Daily  . oxyCODONE  2.5 mg Oral Q0600  . potassium chloride  20 mEq Oral TID  . sodium chloride  10 mL Intravenous Q12H  . vitamin C  500 mg Oral BID  . Warfarin - Pharmacist Dosing Inpatient   Does not apply q1800   Infusions:    Assessment: 69 yo male with DVT is currently on therapeutic coumadin but INR 2.65, hgb 9.3 and plt wnl (10/24), no bleeding noted. Patient is also on imipenem since 09/06/15 for  intra-abdominal infection with abscess. Afebrile; UOP 1 ml/kg/hr, SCr 0.35  Goal of Therapy:  INR 2-3, resolution of infection Monitor platelets by anticoagulation protocol: Yes   Plan:  Warfarin 5 mg  x1 Continue daily PT / INR Continue Primaxin 500mg  IV Q6H  Remi HaggardAlyson N. Dany Harten, PharmD Clinical Pharmacist- Resident Pager: [phone]  Remi HaggardAlyson N Suheyb Raucci 09/13/2015,9:50 AM

## 2015-09-13 NOTE — Progress Notes (Signed)
Occupational Therapy Session Note  Patient Details  Name: Jonathan Le MRN: [id number] Date of Birth: 05-22-1946  Today's Date: 09/13/2015 OT Individual Time: 1100-1215 OT Individual Time Calculation (min): 75 min    Short Term Goals: Week 1:  OT Short Term Goal 1 (Week 1): Pt will complete bathing with mod assist seated at EOB with assist of 1 caregiver OT Short Term Goal 1 - Progress (Week 1): Not met OT Short Term Goal 2 (Week 1): Pt will complete UB dressing with mod assist and min cues for problem solving/organization OT Short Term Goal 2 - Progress (Week 1): Met OT Short Term Goal 3 (Week 1): Pt will complete LB dressing at bed level with max assist OT Short Term Goal 3 - Progress (Week 1): Not met OT Short Term Goal 4 (Week 1): Pt will complete sit > stand from EOB with +2 assist to complete 1 grooming task OT Short Term Goal 4 - Progress (Week 1): Not met Week 2:  OT Short Term Goal 1 (Week 2): Pt will complete LB dressing at bed level with max assist OT Short Term Goal 1 - Progress (Week 2): Met OT Short Term Goal 2 (Week 2): Pt will donn pullover shirt EOB with close supervision. OT Short Term Goal 2 - Progress (Week 2): Not met OT Short Term Goal 3 (Week 2): Pt will perform UB bathing sitting unsupported EOB with supervision, including reaching both upper shoulders.  OT Short Term Goal 3 - Progress (Week 2): Met OT Short Term Goal 4 (Week 2): Pt will transition sidelying to sitting in order to complete seltcare tasks with mod assist 2 consecutive sessions OT Short Term Goal 4 - Progress (Week 2): Not met OT Short Term Goal 5 (Week 2): Pt will perform sliding board transfer from bed to wheelchair with max assist. OT Short Term Goal 5 - Progress (Week 2): Not met Week 3:  OT Short Term Goal 1 (Week 3): Pt will donn pullover shirt EOB with close supervision. OT Short Term Goal 2 (Week 3): Pt will transition sidelying to sitting in order to complete seltcare tasks with mod  assist 2 consecutive sessions OT Short Term Goal 3 (Week 3): Pt will perform sliding board transfer from bed to wheelchair with max assist. OT Short Term Goal 4 (Week 3): Pt will roll to the left with min assist and to the right with supervision during LB selfcare tasks.   Skilled Therapeutic Interventions/Progress Updates:    Skilled OT intervention with treatment focus on the following:  Bed mobility, sitting balance, activity tolerance   Activitiy performed:  Bathing and dressing Place:  EOB  Pt lying int bed.  Performed rolling and sitting EOB with mod assist going to the right side AE for Energy Conservation Techniques (ECT) and decreased mobility while bathing and dressing.  Practice scooting backward and leaning side to side for increased safety sitting EOB.  Pt used AE.  Doffed old clothes, but donned hospital gown.  Pt. Returned to supine with max assist.  Performed rolling for peri area with mod assist for rolling and total assist for bathing peri area.   Left with all needs in reach.        Therapy Documentation Precautions:  Precautions Precautions: Fall Precaution Comments: sacral decub, trach, drain lines from abdomen Restrictions Weight Bearing Restrictions: No Other Position/Activity Restrictions: trach collar      Pain: Pain Assessment Pain Score: 3   buttocks    :   :  See Function Navigator for Current Functional Status.   Therapy/Group: Individual Therapy  Lisa Roca 09/13/2015, 12:37 PM

## 2015-09-13 NOTE — Progress Notes (Signed)
Physical Therapy Session Note  Patient Details  Name: Jonathan Le MRN: [id number] Date of Birth: 1946/01/13  Today's Date: 09/13/2015 PT Individual Time: 0800-0930 PT Individual Time Calculation (min): 90 min   Short Term Goals: Week 1:  PT Short Term Goal 1 (Week 1): Will perform supine <>sit with maxA +1 and bedrails PT Short Term Goal 1 - Progress (Week 1): Met PT Short Term Goal 2 (Week 1): Will perform sitting balance on EOB x10 min with no UE support while performing functional task PT Short Term Goal 2 - Progress (Week 1): Progressing toward goal PT Short Term Goal 3 (Week 1): Will perform sit <>stand with maxA +2 PT Short Term Goal 3 - Progress (Week 1): Progressing toward goal PT Short Term Goal 4 (Week 1): Will perform w/c propulsion x100' with modA PT Short Term Goal 4 - Progress (Week 1): Progressing toward goal  Skilled Therapeutic Interventions/Progress Updates:  Pt was seen bedside in the am. Pt rolled R/L with side rail and min A with verbal cues multiple times to assist with donning underwear and shorts. Utilized ace wraps B LEs for pain and edema management. Pt transferred supine to edge of bed with mod A, siderails and verbal cues. Pt tolerated edge of bed about 5 minutes with S. Pt transferred edge of bed to w/c with sliding board, step positioned under feet, and +2 mod A with multiple verbal cues. Pt propelled w/c about 75 feet with B UEs, S and multiple verbal cues and increased time. In gym treatment focused on standing at side of parallel bars. Pt stood x 5 with +2 mod A. Pt tolerated standing about 10 to 25 seconds each time. Pt demonstrated improved ability to clear buttocks off the w/c however continues to have difficulty with standing upright secondary to UE and back weakness. Pt returned to room. Pt transferred w/c to edge of bed with sliding board and +2 assist, Max A of one and second stand by for safety. Pt transferred edge of bed to supine with mod A and  verbal cues. Pt positioned in R sidelying at end of treatment and left with call bell within reach.   Therapy Documentation Precautions:  Precautions Precautions: Fall Precaution Comments: sacral decub, trach, drain lines from abdomen Restrictions Weight Bearing Restrictions: No Other Position/Activity Restrictions: trach collar General:   Pain: Pt c/o mod to severe pain sacral region medicated prior to treatment.   See Function Navigator for Current Functional Status.   Therapy/Group: Individual Therapy  Dub Amis 09/13/2015, 12:25 PM

## 2015-09-14 ENCOUNTER — Inpatient Hospital Stay (HOSPITAL_COMMUNITY): Payer: Medicare Other | Admitting: Physical Therapy

## 2015-09-14 ENCOUNTER — Ambulatory Visit (HOSPITAL_COMMUNITY): Payer: Medicare Other

## 2015-09-14 ENCOUNTER — Inpatient Hospital Stay (HOSPITAL_COMMUNITY): Payer: Medicare Other | Admitting: Occupational Therapy

## 2015-09-14 LAB — CBC WITH DIFFERENTIAL/PLATELET
BASOS ABS: 0.1 10*3/uL (ref 0.0–0.1)
Basophils Relative: 2 %
EOS ABS: 1.1 10*3/uL — AB (ref 0.0–0.7)
Eosinophils Relative: 17 %
HCT: 29.4 % — ABNORMAL LOW (ref 39.0–52.0)
HEMOGLOBIN: 9.4 g/dL — AB (ref 13.0–17.0)
LYMPHS PCT: 32 %
Lymphs Abs: 2 10*3/uL (ref 0.7–4.0)
MCH: 28.5 pg (ref 26.0–34.0)
MCHC: 32 g/dL (ref 30.0–36.0)
MCV: 89.1 fL (ref 78.0–100.0)
Monocytes Absolute: 0.7 10*3/uL (ref 0.1–1.0)
Monocytes Relative: 11 %
NEUTROS ABS: 2.5 10*3/uL (ref 1.7–7.7)
NEUTROS PCT: 38 %
Platelets: 303 10*3/uL (ref 150–400)
RBC: 3.3 MIL/uL — ABNORMAL LOW (ref 4.22–5.81)
RDW: 16.2 % — ABNORMAL HIGH (ref 11.5–15.5)
WBC: 6.4 10*3/uL (ref 4.0–10.5)

## 2015-09-14 LAB — BASIC METABOLIC PANEL
Anion gap: 7 (ref 5–15)
BUN: 9 mg/dL (ref 6–20)
CHLORIDE: 102 mmol/L (ref 101–111)
CO2: 29 mmol/L (ref 22–32)
CREATININE: 0.3 mg/dL — AB (ref 0.61–1.24)
Calcium: 8.1 mg/dL — ABNORMAL LOW (ref 8.9–10.3)
Glucose, Bld: 87 mg/dL (ref 65–99)
Potassium: 3.4 mmol/L — ABNORMAL LOW (ref 3.5–5.1)
SODIUM: 138 mmol/L (ref 135–145)

## 2015-09-14 LAB — PROTIME-INR
INR: 2.67 — AB (ref 0.00–1.49)
PROTHROMBIN TIME: 28 s — AB (ref 11.6–15.2)

## 2015-09-14 LAB — GLUCOSE, CAPILLARY
Glucose-Capillary: 97 mg/dL (ref 65–99)
Glucose-Capillary: 115 mg/dL — ABNORMAL HIGH (ref 65–99)
Glucose-Capillary: 111 mg/dL — ABNORMAL HIGH (ref 65–99)
Glucose-Capillary: 94 mg/dL (ref 65–99)

## 2015-09-14 LAB — PATHOLOGIST SMEAR REVIEW: Path Review: ELEVATED

## 2015-09-14 MED ORDER — WARFARIN SODIUM 5 MG PO TABS
5.0000 mg | ORAL_TABLET | Freq: Once | ORAL | Status: AC
  Administered 2015-09-14: 5 mg via ORAL
  Filled 2015-09-14: qty 1

## 2015-09-14 MED ORDER — TAMSULOSIN HCL 0.4 MG PO CAPS
0.4000 mg | ORAL_CAPSULE | Freq: Every day | ORAL | Status: DC
  Administered 2015-09-14 – 2015-09-24 (×11): 0.4 mg via ORAL
  Filled 2015-09-14 (×11): qty 1

## 2015-09-14 NOTE — Progress Notes (Signed)
ANTICOAGULATION CONSULT NOTE - Follow Up Consult  Pharmacy Consult for coumadin Indication: DVT  Allergies  Allergen Reactions  . Unasyn [Ampicillin-Sulbactam Sodium] Hives, Itching and Rash    Diffuse rash not responding to Pepcid and Benadryl.  TDD.  Marland Kitchen. Levaquin [Levofloxacin] Nausea Only  . Penicillins Hives  . Doxycycline Rash    Patient Measurements: Height: 5\' 9"  (175.3 cm) Weight: 241 lb 4.8 oz (109.453 kg) IBW/kg (Calculated) : 70.7 Heparin Dosing Weight:   Vital Signs: Temp: 98.2 F (36.8 C) (10/31 0454) Temp Source: Oral (10/31 0454) BP: 96/68 mmHg (10/31 0454) Pulse Rate: 94 (10/31 0741)  Labs:  Recent Labs  09/12/15 0630 09/13/15 0515 09/14/15 0510  HGB  --   --  9.4*  HCT  --   --  29.4*  PLT  --   --  303  LABPROT 26.4* 27.9* 28.0*  INR 2.46* 2.65* 2.67*  CREATININE  --  0.35* 0.30*    Estimated Creatinine Clearance: 104.8 mL/min (by C-G formula based on Cr of 0.3).   Medications:  Scheduled:  . antiseptic oral rinse  15 mL Mouth Rinse TID PC  . collagenase   Topical BID  . diclofenac sodium  2 g Topical QID  . famotidine  20 mg Oral BID  . feeding supplement (ENSURE ENLIVE)  237 mL Oral Q24H  . feeding supplement (PRO-STAT SUGAR FREE 64)  30 mL Oral TID  . furosemide  60 mg Oral Daily  . hydrocerin   Topical BID  . imipenem-cilastatin  500 mg Intravenous 4 times per day  . insulin aspart  0-5 Units Subcutaneous QHS  . insulin aspart  0-9 Units Subcutaneous TID WC  . magnesium oxide  800 mg Oral BID  . multivitamin with minerals  1 tablet Oral Daily  . oxyCODONE  2.5 mg Oral Q0600  . potassium chloride  20 mEq Oral TID  . sodium chloride  10 mL Intravenous Q12H  . vitamin C  500 mg Oral BID  . Warfarin - Pharmacist Dosing Inpatient   Does not apply q1800   Infusions:    Assessment: 89 male with DVT is currently on therapeutic coumadin.  INR today is 2.67.  Goal of Therapy:  INR 2-3 Monitor platelets by anticoagulation protocol:  Yes   Plan:  - coumadin 5 mg po x1 - INR in am  Frances Joynt, Tsz-Yin 09/14/2015,8:12 AM

## 2015-09-14 NOTE — Progress Notes (Signed)
Occupational Therapy Session Note  Patient Details  Name: Jonathan Le MRN: [id number] Date of Birth: 11/18/1945  Today's Date: 09/14/2015 OT Individual Time: 0800-0900 OT Individual Time Calculation (min): 60 min    Short Term Goals: Week 3:  OT Short Term Goal 1 (Week 3): Pt will donn pullover shirt EOB with close supervision. OT Short Term Goal 2 (Week 3): Pt will transition sidelying to sitting in order to complete seltcare tasks with mod assist 2 consecutive sessions OT Short Term Goal 3 (Week 3): Pt will perform sliding board transfer from bed to wheelchair with max assist. OT Short Term Goal 4 (Week 3): Pt will roll to the left with min assist and to the right with supervision during LB selfcare tasks.   Skilled Therapeutic Interventions/Progress Updates:    Pt worked on bathing sitting EOB to supine during session.  He was able to roll to the right for transition to sitting with supervision but still requires mod assist for transition to sitting EOB.  Once sitting he is able to complete all UB bathing with supervision.  Utilized LH sponge and reacher for doffing gripper socks and washing and drying his feet and lower legs.  Transitioned back to supine with HOB up for pt to wash his peri area.  Min assist for rolling to the left with HOB flat in order for therapist to assist with washing his buttocks.  Did not attempt LB dressing secondary to pt having hydrotherapy on wound in approximately 30 mins.  Encouraged pt to perform dowel rod exercises for bilateral shoulder flexion while waiting for next therapy.   Therapy Documentation Precautions:  Precautions Precautions: Fall Precaution Comments: sacral decub, trach, drain lines from abdomen Restrictions Weight Bearing Restrictions: No Other Position/Activity Restrictions: trach collar  Pain: Pain Assessment Pain Assessment: Faces Faces Pain Scale: Hurts a little bit Pain Type: Acute pain Pain Location: Buttocks Pain Onset:  With Activity Pain Intervention(s): Repositioned ADL: See Function Navigator for Current Functional Status.   Therapy/Group: Individual Therapy  Marcelles Clinard OTR/L 09/14/2015, 9:19 AM

## 2015-09-14 NOTE — Progress Notes (Signed)
Social Work Patient ID: Jonathan Le, male   DOB: 1946-10-18, 69 y.o.   MRN: [id number] Met with pt, sister and brother in-law to discuss maybe drains coming out this week and wound vac being placed on sacral wound. Will ask again about his trach coming out, due to he is getting stronger in therapies. Wife has not come up still to see pt.  He is doing ok and Is working in therapies, realizing he needs to push himself to get better.  Sister and brother in-law are strong supports for him.  Will see daily for support and work On discharge plans.

## 2015-09-14 NOTE — Progress Notes (Signed)
Physical Therapy Note  Patient Details  Name: Jonathan Le MRN: [id number] Date of Birth: 05/28/1946 Today's Date: 09/14/2015    Time: 1300-1357 57 minutes  1:1 No c/o pain at rest, c/o pain in wound with sliding board, eased when out of position.  Supine to sit with mod A and bedrails.  Sliding board transfer to w/c with stool under feet with +2 mod/max A.  Pt with difficulty getting push from LEs and UEs to assist with transfer, needs cues for head/hips relationship.  W/c mobility with supervision 20', 10'.  Sit to stand at parallel bars with +2 mod/max A with pt able to remain standing 5 seconds each time.  Pt requires cues for full trunk and knee extension, unable to come to full trunk extension due to weakness.  Pt is encouraged and motivated by progress.   Rebekkah Powless 09/14/2015, 1:57 PM

## 2015-09-14 NOTE — Progress Notes (Signed)
Upon entering patient's room to hang midnight dose of IVPB antibiotic, patient was awake and visibly upset.  Patient had been on phone with family members for several hours talking about his health and progress in hospital so far.  RN listened to patient and provided emotional support as he became teary-eyed.  Patient verbalized appreciation.  Will continue to monitor.

## 2015-09-14 NOTE — Progress Notes (Signed)
Patient ID: Jonathan Le, male   DOB: 08/24/1946, 69 y.o.   MRN: [id number]    Subjective: Pt with no new complaints this morning.  Objective: Vital signs in last 24 hours: Temp:  [97.5 F (36.4 C)-98.2 F (36.8 C)] 98.2 F (36.8 C) (10/31 0454) Pulse Rate:  [85-97] 94 (10/31 0741) Resp:  [16-18] 16 (10/31 0741) BP: (96-108)/(63-68) 96/68 mmHg (10/31 0454) SpO2:  [93 %-99 %] 94 % (10/31 0741) Weight:  [109.453 kg (241 lb 4.8 oz)] 109.453 kg (241 lb 4.8 oz) (10/31 0454) Last BM Date: 09/13/15  Intake/Output from previous day: 10/30 0701 - 10/31 0700 In: 1050 [P.O.:720; IV Piggyback:300] Out: 3750 [Urine:2375; Drains:100; Stool:1275] Intake/Output this shift:    PE: Abd: midline wound completely healed.  No further dressing changes need to be done to this.  All drains are serous in natures Skin: sacral wound is almost completely clean.  Still a small amount of fibrin very deep at the base, otherwise good granulation tissue present  Lab Results:   Recent Labs  09/14/15 0510  WBC 6.4  HGB 9.4*  HCT 29.4*  PLT 303   BMET  Recent Labs  09/13/15 0515 09/14/15 0510  NA 134* 138  K 3.6 3.4*  CL 99* 102  CO2 29 29  GLUCOSE 87 87  BUN 7 9  CREATININE 0.35* 0.30*  CALCIUM 8.0* 8.1*   PT/INR  Recent Labs  09/13/15 0515 09/14/15 0510  LABPROT 27.9* 28.0*  INR 2.65* 2.67*   CMP     Component Value Date/Time   NA 138 09/14/2015 0510   K 3.4* 09/14/2015 0510   CL 102 09/14/2015 0510   CO2 29 09/14/2015 0510   GLUCOSE 87 09/14/2015 0510   BUN 9 09/14/2015 0510   CREATININE 0.30* 09/14/2015 0510   CALCIUM 8.1* 09/14/2015 0510   PROT 5.2* 08/28/2015 0430   ALBUMIN 1.6* 08/28/2015 0430   AST 22 08/28/2015 0430   ALT 10* 08/28/2015 0430   ALKPHOS 80 08/28/2015 0430   BILITOT 0.6 08/28/2015 0430   GFRNONAA >60 09/14/2015 0510   GFRAA >60 09/14/2015 0510   Lipase     Component Value Date/Time   LIPASE 15* 05/19/2015 2140       Studies/Results: No  results found.  Anti-infectives: Anti-infectives    Start     Dose/Rate Route Frequency Ordered Stop   09/06/15 1400  imipenem-cilastatin (PRIMAXIN) 500 mg in sodium chloride 0.9 % 100 mL IVPB  Status:  Discontinued     500 mg 200 mL/hr over 30 Minutes Intravenous 3 times per day 09/06/15 1110 09/06/15 1121   09/06/15 1200  imipenem-cilastatin (PRIMAXIN) 500 mg in sodium chloride 0.9 % 100 mL IVPB     500 mg 200 mL/hr over 30 Minutes Intravenous 4 times per day 09/06/15 1121     09/01/15 1800  Ampicillin-Sulbactam (UNASYN) 3 g in sodium chloride 0.9 % 100 mL IVPB  Status:  Discontinued     3 g 100 mL/hr over 60 Minutes Intravenous Every 6 hours 09/01/15 1539 09/06/15 1105   08/27/15 2200  meropenem (MERREM) 1 g in sodium chloride 0.9 % 100 mL IVPB  Status:  Discontinued     1 g 200 mL/hr over 30 Minutes Intravenous 3 times per day 08/27/15 1658 09/01/15 1436   08/27/15 1615  metroNIDAZOLE (FLAGYL) tablet 500 mg  Status:  Discontinued     500 mg Oral 3 times per day 08/27/15 1610 08/28/15 1651       Assessment/Plan  1. Stage 4 sacral decub ulcer -cont BID dressing changes, santyl, and hydrotherapy. -if looks good on Wednesday, will likely place a VAC then.  Hopefully a couple more sessions of hydro will continue to work on the deep base. -agree with repeat CT soon for his JP drains.  Hopefully these can be DC soon.  Output remains serous and WBC normal.  On abx therapy, primaxin, but ID recommended on 10/20 to decrease to unasyn and then switch to oral augmentin for possibly another month.  The patient does not appear ill and I don't think he needs primaxin still for his abdomen.  He could be switched to oral augmentin, although I think it's doubtful he still even needs that, but will defer to primary service/ID  LOS: 18 days    Wilhemina Grall E 09/14/2015, 9:37 AM Pager: [phone]

## 2015-09-14 NOTE — Progress Notes (Signed)
Physical Therapy Wound Treatment Patient Details  Name: Jonathan Le MRN: [id number] Date of Birth: 1946/10/05  Today's Date: 09/14/2015 Time: 1001-1031 Time Calculation (min): 30 min  Subjective  Subjective: Pt reports sliding in bed makes wound tender. Patient and Family Stated Goals: Get better Date of Onset:  (prior to this hospitialization) Prior Treatments: I&D, dressing changes  Pain Score:  Reports wound is sore but no specific pain from hydrotherapy.  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 Subcutaneous fat may be visible but bone, tendon or muscle are NOT exposed. (Active)  Dressing Type Gauze (Comment);Moist to dry;Tape dressing;Other (Comment) 09/14/2015 11:53 AM  Dressing Changed;Clean;Dry;Intact 09/14/2015 11:53 AM  Dressing Change Frequency Twice a day 09/14/2015 11:53 AM  State of Healing Early/partial granulation 09/14/2015 11:53 AM  Site / Wound Assessment Pink;Red;Yellow 09/14/2015 11:53 AM  % Wound base Red or Granulating 75% 09/14/2015 11:53 AM  % Wound base Yellow 25% 09/14/2015 11:53 AM  % Wound base Black 0% 09/14/2015 11:53 AM  % Wound base Other (Comment) 0% 09/14/2015 11:53 AM  Peri-wound Assessment Intact 09/14/2015 11:53 AM  Wound Length (cm) 7.9 cm 09/07/2015 10:13 AM  Wound Width (cm) 9.1 cm 09/07/2015 10:13 AM  Wound Depth (cm) 4.5 cm 09/07/2015 10:13 AM  Tunneling (cm) 5.6 at 12-1:00 o'clock 09/07/2015 10:13 AM  Margins Unattached edges (unapproximated) 09/14/2015 11:53 AM  Drainage Amount Moderate 09/14/2015 11:53 AM  Drainage Description Serosanguineous 09/14/2015 11:53 AM  Treatment Debridement (Selective);Hydrotherapy (Pulse lavage);Packing (Saline gauze) 09/14/2015 11:53 AM   Santyl applied to wound bed prior to applying dressing.    Hydrotherapy Pulsed lavage therapy - wound location: sacrum/buttocks Pulsed Lavage with Suction (psi): 8 psi (4-8 psi) Pulsed Lavage with Suction - Normal Saline  Used: 1000 mL Pulsed Lavage Tip: Tip with splash shield Selective Debridement Selective Debridement - Location: sacrum/buttocks Selective Debridement - Tools Used: Forceps;Scissors Selective Debridement - Tissue Removed: yellow fibrous necrotic tissue   Wound Assessment and Plan  Wound Therapy - Assess/Plan/Recommendations Wound Therapy - Clinical Statement: Wound continues to improve and much less necrotic tissue present. Expect won't need hydrotherapy much longer and possible VAC placement soon. Wound Therapy - Functional Problem List: Pt with decr sitting tolerance due to sacral wound. Factors Delaying/Impairing Wound Healing: Immobility;Multiple medical problems;Polypharmacy Hydrotherapy Plan: Debridement;Dressing change;Patient/family education;Pulsatile lavage with suction Wound Therapy - Frequency: 6X / week Wound Therapy - Follow Up Recommendations: Home health RN Wound Plan: See above  Wound Therapy Goals- Improve the function of patient's integumentary system by progressing the wound(s) through the phases of wound healing (inflammation - proliferation - remodeling) by: Decrease Necrotic Tissue to: 10 Decrease Necrotic Tissue - Progress: Progressing toward goal Increase Granulation Tissue to: 90 Increase Granulation Tissue - Progress: Progressing toward goal Goals/treatment plan/discharge plan were made with and agreed upon by patient/family: Yes Time For Goal Achievement: 7 days Wound Therapy - Potential for Goals: Good  Goals will be updated until maximal potential achieved or discharge criteria met.  Discharge criteria: when goals achieved, discharge from hospital, MD decision/surgical intervention, no progress towards goals, refusal/missing three consecutive treatments without notification or medical reason.  GP     Remonia Otte 09/14/2015, 11:58 AM Suanne Marker PT [phone]

## 2015-09-14 NOTE — Progress Notes (Addendum)
Subjective/Complaints: Patient reportedly stood in therapy yesterday, discussed therapy schedule with OT. Still on 15/7                                                                                                                                                                                                            ROS:   Denies CP, SOB, n/v/d. Strength improving. No insomnia, anxiety, appetite ok, pain controlled  Objective: Vital Signs: Blood pressure 96/68, pulse 94, temperature 98.2 F (36.8 C), temperature source Oral, resp. rate 16, height '5\' 9"'  (1.753 m), weight 109.453 kg (241 lb 4.8 oz), SpO2 94 %. No results found. Results for orders placed or performed during the hospital encounter of 08/27/15 (from the past 72 hour(s))  Glucose, capillary     Status: Abnormal   Collection Time: 09/11/15 11:53 AM  Result Value Ref Range   Glucose-Capillary 100 (H) 65 - 99 mg/dL   Comment 1 Notify RN   Glucose, capillary     Status: Abnormal   Collection Time: 09/11/15  4:25 PM  Result Value Ref Range   Glucose-Capillary 104 (H) 65 - 99 mg/dL  Glucose, capillary     Status: Abnormal   Collection Time: 09/11/15  9:55 PM  Result Value Ref Range   Glucose-Capillary 110 (H) 65 - 99 mg/dL  Protime-INR     Status: Abnormal   Collection Time: 09/12/15  6:30 AM  Result Value Ref Range   Prothrombin Time 26.4 (H) 11.6 - 15.2 seconds   INR 2.46 (H) 0.00 - 1.49  Glucose, capillary     Status: None   Collection Time: 09/12/15  7:26 AM  Result Value Ref Range   Glucose-Capillary 90 65 - 99 mg/dL  Glucose, capillary     Status: Abnormal   Collection Time: 09/12/15 12:04 PM  Result Value Ref Range   Glucose-Capillary 103 (H) 65 - 99 mg/dL  Glucose, capillary     Status: Abnormal   Collection Time: 09/12/15  4:25 PM  Result Value Ref Range   Glucose-Capillary 103 (H) 65 - 99 mg/dL  Glucose, capillary     Status: None   Collection Time: 09/12/15  9:27 PM  Result Value Ref Range    Glucose-Capillary 90 65 - 99 mg/dL  Protime-INR     Status: Abnormal   Collection Time: 09/13/15  5:15 AM  Result Value Ref Range   Prothrombin Time 27.9 (H) 11.6 - 15.2 seconds   INR 2.65 (H) 0.00 - 3.83  Basic metabolic panel     Status: Abnormal  Collection Time: 09/13/15  5:15 AM  Result Value Ref Range   Sodium 134 (L) 135 - 145 mmol/L   Potassium 3.6 3.5 - 5.1 mmol/L   Chloride 99 (L) 101 - 111 mmol/L   CO2 29 22 - 32 mmol/L   Glucose, Bld 87 65 - 99 mg/dL   BUN 7 6 - 20 mg/dL   Creatinine, Ser 0.35 (L) 0.61 - 1.24 mg/dL   Calcium 8.0 (L) 8.9 - 10.3 mg/dL   GFR calc non Af Amer >60 >60 mL/min   GFR calc Af Amer >60 >60 mL/min    Comment: (NOTE) The eGFR has been calculated using the CKD EPI equation. This calculation has not been validated in all clinical situations. eGFR's persistently <60 mL/min signify possible Chronic Kidney Disease.    Anion gap 6 5 - 15  Glucose, capillary     Status: None   Collection Time: 09/13/15  7:02 AM  Result Value Ref Range   Glucose-Capillary 90 65 - 99 mg/dL  Glucose, capillary     Status: None   Collection Time: 09/13/15 12:14 PM  Result Value Ref Range   Glucose-Capillary 97 65 - 99 mg/dL  Glucose, capillary     Status: Abnormal   Collection Time: 09/13/15  4:33 PM  Result Value Ref Range   Glucose-Capillary 104 (H) 65 - 99 mg/dL  Glucose, capillary     Status: Abnormal   Collection Time: 09/13/15  9:46 PM  Result Value Ref Range   Glucose-Capillary 101 (H) 65 - 99 mg/dL  Protime-INR     Status: Abnormal   Collection Time: 09/14/15  5:10 AM  Result Value Ref Range   Prothrombin Time 28.0 (H) 11.6 - 15.2 seconds   INR 2.67 (H) 0.00 - 1.49  CBC with Differential/Platelet     Status: Abnormal (Preliminary result)   Collection Time: 09/14/15  5:10 AM  Result Value Ref Range   WBC 6.4 4.0 - 10.5 K/uL    Comment: REPEATED TO VERIFY CONSISTENT WITH PREVIOUS RESULT    RBC 3.30 (L) 4.22 - 5.81 MIL/uL   Hemoglobin 9.4 (L) 13.0 -  17.0 g/dL    Comment: REPEATED TO VERIFY CONSISTENT WITH PREVIOUS RESULT    HCT 29.4 (L) 39.0 - 52.0 %   MCV 89.1 78.0 - 100.0 fL   MCH 28.5 26.0 - 34.0 pg   MCHC 32.0 30.0 - 36.0 g/dL   RDW 16.2 (H) 11.5 - 15.5 %   Platelets 303 150 - 400 K/uL    Comment: REPEATED TO VERIFY CONSISTENT WITH PREVIOUS RESULT    Neutrophils Relative % PENDING %   Neutro Abs PENDING 1.7 - 7.7 K/uL   Band Neutrophils PENDING %   Lymphocytes Relative PENDING %   Lymphs Abs PENDING 0.7 - 4.0 K/uL   Monocytes Relative PENDING %   Monocytes Absolute PENDING 0.1 - 1.0 K/uL   Eosinophils Relative PENDING %   Eosinophils Absolute PENDING 0.0 - 0.7 K/uL   Basophils Relative PENDING %   Basophils Absolute PENDING 0.0 - 0.1 K/uL   WBC Morphology PENDING    RBC Morphology PENDING    Smear Review PENDING    nRBC PENDING 0 /100 WBC   Metamyelocytes Relative PENDING %   Myelocytes PENDING %   Promyelocytes Absolute PENDING %   Blasts PENDING %  Basic metabolic panel     Status: Abnormal   Collection Time: 09/14/15  5:10 AM  Result Value Ref Range   Sodium 138 135 - 145  mmol/L   Potassium 3.4 (L) 3.5 - 5.1 mmol/L   Chloride 102 101 - 111 mmol/L   CO2 29 22 - 32 mmol/L   Glucose, Bld 87 65 - 99 mg/dL   BUN 9 6 - 20 mg/dL   Creatinine, Ser 0.30 (L) 0.61 - 1.24 mg/dL   Calcium 8.1 (L) 8.9 - 10.3 mg/dL   GFR calc non Af Amer >60 >60 mL/min   GFR calc Af Amer >60 >60 mL/min    Comment: (NOTE) The eGFR has been calculated using the CKD EPI equation. This calculation has not been validated in all clinical situations. eGFR's persistently <60 mL/min signify possible Chronic Kidney Disease.    Anion gap 7 5 - 15  Glucose, capillary     Status: None   Collection Time: 09/14/15  7:06 AM  Result Value Ref Range   Glucose-Capillary 97 65 - 99 mg/dL    Nursing note and vitals reviewed. Constitutional: He is oriented to person, place, and time.he is slower today He appears well-developed and well-nourished. No  distress.  HENT:  Head: Normocephalic and atraumatic.  Eyes: Conjunctivae and EOM are normal.   Neck: Decreased range of motion present.  Trach with dry dressing in place. #6--able to speak with PMV Cardiovascular: Normal rate and regular rhythm. noES Respiratory: Effort normal and breath sounds normal. No respiratory distress. He has no wheezes.    GI: Soft. Bowel sounds are normal.  Non-tender. Midline incision has healed . Ostomy with bilious drainage which appears decreased. Multiple drains LLQ sutured in place.no erythema  Genitourinary:  Foley in place.  Musculoskeletal: He exhibits edema b/l LE  Right knee with effusion. No tenderness.  Skin: Left lower quadrant drains intact. Eschar left heel, right heel grade 1 Right lower shin with stasis changes. Multiple ulcers on bilateral heels Diffuse macular rash Neurological: He is alert and oriented to person, place, and time.    Speech clear. Follows basic commands without difficulty.  Psychiatric: He has a normal mood and affect. His speech is normal and behavior is normal. Cognition and memory are normal.  Strength Antigravity through, except b/l hip flexors.   Sensation diminished in feet.    Assessment/Plan: 1. 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 Physiatrist is providing close team supervision and 24 hour management of active medical problems listed below. Physiatrist and rehab team continue to assess barriers to discharge/monitor patient progress toward functional and medical goals. Patient may have drains removed if output diminishes over the next 3-5 days and CT scan shows resolution of abscess. Ask pulmonary to reevaluate next weekFor potential decannulation Should be done with hydrotherapy pretty soon and sacral wound can be  managed with Wound VAC FIM: Function - Bathing Position: Sitting EOB Body parts bathed by patient: Right arm, Left arm, Chest, Right upper leg, Left upper leg, Right lower leg, Left lower leg Body parts bathed by helper: Front perineal area, Buttocks, Back Bathing not applicable: Abdomen Assist Level: Touching or steadying assistance(Pt > 75%)  Function- Upper Body Dressing/Undressing What is the patient wearing?: Gifford over shirt/dress - Perfomed by patient: Thread/unthread right sleeve, Thread/unthread left sleeve Pull over shirt/dress - Perfomed by helper: Put head through opening Assist Level: Touching or steadying assistance(Pt > 75%) Function - Lower Body Dressing/Undressing What is the patient wearing?: Non-skid slipper socks, Pants Position: Bed Underwear - Performed by helper:  Thread/unthread right underwear leg, Thread/unthread left underwear leg, Pull underwear up/down Pants- Performed by patient: Thread/unthread right pants leg, Thread/unthread left pants leg Pants- Performed by helper: Pull pants up/down Non-skid slipper socks- Performed by patient: Don/doff right sock, Don/doff left sock (Pt doffed gripper socks with use of the reacher but therapist had to put them back on after washing and applying cream to his feet.) Non-skid slipper socks- Performed by helper: Don/doff right sock, Don/doff left sock Socks - Performed by helper: Don/doff right sock, Don/doff left sock Assist for footwear: Dependant Assist for lower body dressing: Touching or steadying assistance (Pt > 75%)  Function - Toileting Toileting activity did not occur: Safety/medical concerns Assist level: Two helpers (per [place], NT report)  Function - Air cabin crew transfer activity did not occur: Safety/medical concerns  Function - Chair/bed transfer Chair/bed transfer activity did not occur: Safety/medical concerns Chair/bed transfer method: Lateral scoot Chair/bed transfer assist  level: 2 helpers Chair/bed transfer assistive device: Armrests, Sliding board, Other (step under feet) Mechanical lift: Maximove Chair/bed transfer details: Verbal cues for safe use of DME/AE, Verbal cues for sequencing, Verbal cues for technique, Verbal cues for precautions/safety, Manual facilitation for weight shifting, Manual facilitation for placement  Function - Locomotion: Wheelchair Will patient use wheelchair at discharge?: Yes Type: Manual Wheelchair activity did not occur: Safety/medical concerns Max wheelchair distance: 75 Assist Level: Supervision or verbal cues Wheel 50 feet with 2 turns activity did not occur: Safety/medical concerns Assist Level: Supervision or verbal cues Wheel 150 feet activity did not occur: Safety/medical concerns Turns around,maneuvers to table,bed, and toilet,negotiates 3% grade,maneuvers on rugs and over doorsills: No Function - Locomotion: Ambulation Ambulation activity did not occur: Safety/medical concerns Walk 10 feet activity did not occur: Safety/medical concerns Walk 50 feet with 2 turns activity did not occur: Safety/medical concerns Walk 150 feet activity did not occur: Safety/medical concerns Walk 10 feet on uneven surfaces activity did not occur: Safety/medical concerns  Function - Comprehension Comprehension: Auditory Comprehension assist level: Understands complex 90% of the time/cues 10% of the time  Function - Expression Expression: Verbal Expression assistive device: Talk trach valve Expression assist level: Expresses complex 90% of the time/cues < 10% of the time  Function - Social Interaction Social Interaction assist level: Interacts appropriately 90% of the time - Needs monitoring or encouragement for participation or interaction.  Function - Problem Solving Problem solving assist level: Solves basic 75 - 89% of the time/requires cueing 10 - 24% of the time  Function - Memory Memory assist level: Recognizes or recalls  90% of the time/requires cueing < 10% of the time Patient normally able to recall (first 3 days only): Current season, Location of own room, Staff names and faces, That he or she is in a hospital  1. 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    2. LLE DVT treatment /Anticoagulation: Pharmaceutical: Coumadin---therapeutic   3. Pain Management: Tylenol for prn for pain. Will continue oxycodone prn if needed for severe pain.  -would benefit from scheduled oxy prior to am therapies---will schedule one hour ahead 4. Mood: LCSW to follow for evaluation and support.  5. Neuropsych: This patient is capable of making decisions on his own behalf. 6. Skin/Wound Care:   sacral wound debridement by surgery  - PRAFO ordered for heels, Cont air bed , Once  Hydrotherapy is d/ced,  place wound VAC, ? This week  -reviewed concept of  serous drainage with patient 7. Fluids/Electrolytes/Nutrition: Monitor I/O. Will consult dietician to help with food choices. Increase prosource to TID. Added ensure between meals as unable to tolerate a lot of food at one time.   - Pt eating 100% of meals.  8. Abdominal abscess: Continue drain care bid.   ID on consult, Pt refused contrast has some improvement of at least one abscess with R drain removed, switched to imipenem, IR following drain output, repeat CT abd when output ~27m/day, does have 160mflush per day each catheter 9. Hypokalemia: Stable at 3.6 today. I personally reviewed the patient's labs today.  10. VDRF/ History of OSA: To continue current trach till mobility improves per CCM. Tolerating humidified air. We will need to reconsult pulmonary before decannulation,thus far patient not gaining much strength or endurance, ask pulm to eval, has extra long trach #5, Don't think there is a number 4 available in this length                                     11. Anxiety disorder: resumed low dose Xanax as used this for insomnia at home. given his respiratory compromise will need to limit dosing.  Improving.  12. Anemia: Likely due to infection/chronic illness. Hgb stable at 9.3 on 10/24.  Will cont to monitor.                      13. Anasarca: Improved Continue Lasix, protein supplements.  14. Diastolic CHF: Off amiodarone, Ace and statin at this time due to recurrent hypotension and multiple medical issues. Monitor for signs of overload. Weights stable.  15.  Hypomagnesemia-. Magnesium 80069mID, repeat Mg level on 10/24, normal at 1.7        60. Urinary retention voiding trial LOS (Days) 18 A FACE TO FACE EVALUATION WAS PERFORMED KIRSTEINS,ANDREW E 09/14/2015, 8:40 AM

## 2015-09-14 NOTE — Progress Notes (Signed)
Occupational Therapy Session Note  Patient Details  Name: Jonathan HarpsHarold D Boldon MRN: [id number] Date of Birth: 08/01/1946  Today's Date: 09/14/2015 OT Individual Time: 1403-1502 OT Individual Time Calculation (min): 59 min    Skilled Therapeutic Interventions/Progress Updates:    Pt worked on wheelchair mobility, UE strengthening, and use of urinal during session.  Pt needs increased time and min assist for pushing his wheelchair up the hallway.  Pt reporting just getting through with pushing the chair with PT as well as working on sit to stand and standing 7 times during the session.  Pt needed 3-4 rest breaks to advance the chair 100 ft with mod assist for turning if he stopped completely to attempt the turn.  He utilized the UE ergonometer for 2 sets of 3 mins each during session.  He was able to complete both sets with level 8 resistance and maintaining RPMs at 20-24.  HR 103 and oxygen sats 95% on room air.  Pt also attempted urinating in urinal during session with mod assist to place it while sitting in the chair.  Pt unsuccessful with attempts to urinate.  Returned to room at end of session with therapist pushing chair secondary to pt fatigue.  Pt's sister and brother-in-law in the room as well.    Therapy Documentation Precautions:  Precautions Precautions: Fall Precaution Comments: sacral decub, trach, drain lines from abdomen Restrictions Weight Bearing Restrictions: No Other Position/Activity Restrictions: trach collar  Pain: Pain Assessment Pain Assessment: Faces Faces Pain Scale: Hurts a little bit Pain Type: Acute pain Pain Location: Buttocks Pain Intervention(s): Repositioned;Emotional support ADL: See Function Navigator for Current Functional Status.   Therapy/Group: Individual Therapy  Maui Ahart OTR/L 09/14/2015, 3:47 PM

## 2015-09-15 ENCOUNTER — Inpatient Hospital Stay (HOSPITAL_COMMUNITY): Payer: Medicare Other | Admitting: Occupational Therapy

## 2015-09-15 ENCOUNTER — Ambulatory Visit (HOSPITAL_COMMUNITY): Payer: Medicare Other

## 2015-09-15 ENCOUNTER — Inpatient Hospital Stay (HOSPITAL_COMMUNITY): Payer: Medicare Other

## 2015-09-15 DIAGNOSIS — G4733 Obstructive sleep apnea (adult) (pediatric): Secondary | ICD-10-CM

## 2015-09-15 LAB — PROTIME-INR
INR: 2.76 — AB (ref 0.00–1.49)
Prothrombin Time: 28.7 seconds — ABNORMAL HIGH (ref 11.6–15.2)

## 2015-09-15 LAB — GLUCOSE, CAPILLARY
Glucose-Capillary: 89 mg/dL (ref 65–99)
Glucose-Capillary: 105 mg/dL — ABNORMAL HIGH (ref 65–99)
Glucose-Capillary: 123 mg/dL — ABNORMAL HIGH (ref 65–99)
Glucose-Capillary: 106 mg/dL — ABNORMAL HIGH (ref 65–99)

## 2015-09-15 MED ORDER — WARFARIN SODIUM 5 MG PO TABS
5.0000 mg | ORAL_TABLET | Freq: Once | ORAL | Status: AC
  Administered 2015-09-15: 5 mg via ORAL
  Filled 2015-09-15: qty 1

## 2015-09-15 NOTE — Progress Notes (Addendum)
Physical Therapy Weekly Progress Note  Patient Details  Name: Jonathan Le MRN: [id number] Date of Birth: October 03, 1946  Beginning of progress report period: 09/05/15 End of progress report period: 09/15/15  Today's Date: 09/15/2015 PT Individual Time: 1430-1530 PT Individual Time Calculation (min): 60 min   Patient has met 2 of 3 short term goals.    Patient continues to demonstrate the following deficits: strength, balance, activity tolerance, awareness, learned helplessness and therefore will continue to benefit from skilled PT intervention to enhance overall performance with activity tolerance, balance, postural control, ability to compensate for deficits, functional use of  right upper extremity, right lower extremity, left upper extremity and left lower extremity and awareness.  Patient progressing toward long term goals..  Continue plan of care.  PT Short Term Goals Week 2:  PT Short Term Goal 1 (Week 2): Pt will perform w/c propulsion 50' with mod A PT Short Term Goal 1 - Progress (Week 2): Met PT Short Term Goal 2 (Week 2): Pt will sit EOB x 10 minutes with supervision for functional UE task PT Short Term Goal 2 - Progress (Week 2): Partly met PT Short Term Goal 3 (Week 2): Pt will transfer bed <> chair consistently with max A of 2 PT Short Term Goal 3 - Progress (Week 2): Met     Skilled Therapeutic Interventions/Progress Updates:   Pt's sister reported that she assisted pt with Hospital Ex Program on Saturday: supine- heel slides, hip abduction/adduction, ankle pumps and circles, modified abdominal crunches, glut sets.  Pt sitting up in w/c, with shorts wet from bladder accident.  Pt reported he wanted to stay up in the w/c even though he knew he was wet, in order to participate in PT.  PT wrapped R knee with ACE wraps for knee pain/OA.  Pt is requesting Voltaren before therapies.  Attempted to use SaraStedy x 4 for sit> partial stand; with +2 assist, pt still not able to  elevate hips enough to place seat components under him.  +2 slide board transfer w/c> bed to L, focusing on head /hips relationship and pushing through bil LEs.   neuromuscular re-education for bed mobility for upper/lower trunk dissociation, hip flexion before attempting hip abduction or adduction.  Pt is able to state if he is lying diagonally in bed, but needs multiple questioning cues to initiate movement.  Pt left resting in bed with all needs within reach.  PT requested pt have assistance for hygiene and clothing change.    Therapy Documentation Precautions:  Precautions Precautions: Fall Precaution Comments: sacral decub, trach, drain lines from abdomen Restrictions Weight Bearing Restrictions: No Other Position/Activity Restrictions: trach collar   Pain: rated 6/10 buttocks; premedicated     See Function Navigator for Current Functional Status.  Therapy/Group: Individual Therapy  Carson Bogden 09/15/2015, 3:53 PM

## 2015-09-15 NOTE — Progress Notes (Signed)
Occupational Therapy Session Note  Patient Details  Name: Jonathan Le MRN: [id number] Date of Birth: 16-Jul-1946  Today's Date: 09/15/2015 OT Individual Time: 1638-4665 and 1100-1200 OT Individual Time Calculation (min): 60 min and 60 min   Short Term Goals: Week 1:  OT Short Term Goal 1 (Week 1): Pt will complete bathing with mod assist seated at EOB with assist of 1 caregiver OT Short Term Goal 1 - Progress (Week 1): Not met OT Short Term Goal 2 (Week 1): Pt will complete UB dressing with mod assist and min cues for problem solving/organization OT Short Term Goal 2 - Progress (Week 1): Met OT Short Term Goal 3 (Week 1): Pt will complete LB dressing at bed level with max assist OT Short Term Goal 3 - Progress (Week 1): Not met OT Short Term Goal 4 (Week 1): Pt will complete sit > stand from EOB with +2 assist to complete 1 grooming task OT Short Term Goal 4 - Progress (Week 1): Not met Week 2:  OT Short Term Goal 1 (Week 2): Pt will complete LB dressing at bed level with max assist OT Short Term Goal 1 - Progress (Week 2): Met OT Short Term Goal 2 (Week 2): Pt will donn pullover shirt EOB with close supervision. OT Short Term Goal 2 - Progress (Week 2): Not met OT Short Term Goal 3 (Week 2): Pt will perform UB bathing sitting unsupported EOB with supervision, including reaching both upper shoulders.  OT Short Term Goal 3 - Progress (Week 2): Met OT Short Term Goal 4 (Week 2): Pt will transition sidelying to sitting in order to complete seltcare tasks with mod assist 2 consecutive sessions OT Short Term Goal 4 - Progress (Week 2): Not met OT Short Term Goal 5 (Week 2): Pt will perform sliding board transfer from bed to wheelchair with max assist. OT Short Term Goal 5 - Progress (Week 2): Not met Week 3:  OT Short Term Goal 1 (Week 3): Pt will donn pullover shirt EOB with close supervision. OT Short Term Goal 2 (Week 3): Pt will transition sidelying to sitting in order to complete  seltcare tasks with mod assist 2 consecutive sessions OT Short Term Goal 3 (Week 3): Pt will perform sliding board transfer from bed to wheelchair with max assist. OT Short Term Goal 4 (Week 3): Pt will roll to the left with min assist and to the right with supervision during LB selfcare tasks.   Skilled Therapeutic Interventions/Progress Updates:    Visit 1:  No c/o pain at rest.  Pt seen this session to facilitate functional mobility and ADL skills. Reviewed STGs with pt and that he would work on each of the STGs during both sessions.  Pt was able to roll in both directions on bed with bending knees with slight assist and slight guiding assist to move push knees to side first. Once on R side, pt guided to look down to floor and min A to lower legs to floor as pt pushed self into sitting with guiding A.  Sitting on EOB, pt bathed UB and donned shirt with just a little A to start down back. He tolerated sitting EOB for at least 20 minutes to also doff socks with reacher and wash feet with long sponge.  Pt moved into supine to practice rolling techniques again 4 more times. Pt used urinal in bed and set up to prepare for wound care team.  Visit 2:  No c/o pain at rest,  did c/o knee pain and pain "all over" with standing in Sarah lift.   Pt received in bed, pt worked on rolling with guiding A of knees to wash bottom and don shorts over hips. Pt did use reacher to pull shorts over feet with mod A as pt in bed.  Pt sat to EOB and used slide board with pad on board to protect legs from rubbing (as pt stated this has been a problem for him) with 2 helpers present but he only needed min guiding A with the pad to fully move across the board. Pt taken to day room to develop sit to stand and standing tolerance using the [facility] lift with harness pad.  B knees ace wrapped for comfort.  Two OTs assisted pt with set up and for cuing with pt for hip/trunk positioning in stander. Pt tolerated approximately 30 seconds for 3  sets of standing with Clarise Cruz lift. Pt did complain of pain and adjustments made to increase comfort level.  Pt did demonstrate increased activity tolerance and did respond to cues for activating leg muscles. Pt adjusted back into w/c and taken back to his room to use urinal again and set up for lunch with all needs met.    Therapy Documentation Precautions:  Precautions Precautions: Fall Precaution Comments: sacral decub, trach, drain lines from abdomen Restrictions Weight Bearing Restrictions: No Other Position/Activity Restrictions: trach collar    Vital Signs: Therapy Vitals Temp: 98.5 F (36.9 C) Temp Source: Oral Pulse Rate: 92 Resp: 16 BP: (!) 95/57 mmHg Patient Position (if appropriate): Lying Oxygen Therapy SpO2: 94 % O2 Device: Not Delivered    ADL:  See Function Navigator for Current Functional Status.   Therapy/Group: Individual Therapy  SAGUIER,JULIA 09/15/2015, 8:30 AM

## 2015-09-15 NOTE — Consult Note (Signed)
  NEUROPSYCHOLOGY FOLLOW-UP - [facility]   MEDICAL NECESSITY:  Jonathan Le was seen on the [facility] for an initial diagnostic evaluation owing to the patient's diagnosis of critical illness myopathy.   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 He required total colectomy with ileostomy by Dr. Donell BeersByerly and self extubated on 08/08 but developed respiratory distress with confusion requiring eventual intubation."   During today's visit, Jonathan Le denied experiencing any new onset cognitive issues. His primary issue was that he feels he will be discharged from the rehab unit too early, meaning that he will not be physically ready. His wife purportedly does not want him to leave until he can walk. He thinks that by transitioning to a skilled nursing facility that he will have difficulty learning their new routines and the new staff, and that it would be advantageous for him to remain at [facility] longer. Patient did not speak as much about marital discord as he did during our last session. He still feels that he is making progress in therapy. No barriers to therapy identified. He continues to have ample social support.    IMPRESSION: Jonathan Le denied suffering from any new cognitive difficulties and many of his previously reported symptoms seemed fairly benign. He is more concerned about his future rehab treatment and where that might take place. I  spent time normalizing his feeling and providing support. I also told him that I would educate myself on his present insurance situation and report back to him, which I will do next week. I then encouraged him to follow-up with his social worker who likely has an explanation already, and he said that she spoke to him already.   Given these new stressors, I will continue to informally follow the patient throughout this hospitalization for supportive psychotherapy.     Debbe MountsAdam T. Adely Facer, Psy.D.  Clinical Neuropsychologist

## 2015-09-15 NOTE — Progress Notes (Signed)
Physical Therapy Session Note  Patient Details  Name: Ardyth HarpsHarold D Salido MRN: [id number] Date of Birth: 06/13/1946  Today's Date: 09/15/2015 PT Individual Time:  -      Short Term Goals: Week 2:  PT Short Term Goal 1 (Week 2): Pt will perform w/c propulsion 50' with mod A PT Short Term Goal 2 (Week 2): Pt will sit EOB x 10 minutes with supervision for functional UE task PT Short Term Goal 3 (Week 2): Pt will transfer bed <> chair consistently with max A of 2 Week 3:     Skilled Therapeutic Interventions/Progress Updates:      Therapy Documentation Precautions:  Precautions Precautions: Fall Precaution Comments: sacral decub, trach, drain lines from abdomen Restrictions Weight Bearing Restrictions: No Other Position/Activity Restrictions: trach collar General:   Vital Signs:  Pain:   Mobility:   Locomotion :    Trunk/Postural Assessment :    Balance:   Exercises:   Other Treatments:     See Function Navigator for Current Functional Status.   Therapy/Group: Individual Therapy  Tadashi Burkel 09/15/2015, 1:01 PM

## 2015-09-15 NOTE — Progress Notes (Signed)
ANTICOAGULATION CONSULT NOTE - Follow Up Consult  Pharmacy Consult for coumadin Indication: DVT  Allergies  Allergen Reactions  . Unasyn [Ampicillin-Sulbactam Sodium] Hives, Itching and Rash    Diffuse rash not responding to Pepcid and Benadryl.  TDD.  Marland Kitchen. Levaquin [Levofloxacin] Nausea Only  . Penicillins Hives  . Doxycycline Rash    Patient Measurements: Height: 5\' 9"  (175.3 cm) Weight: 239 lb 6.7 oz (108.6 kg) IBW/kg (Calculated) : 70.7 Heparin Dosing Weight:   Vital Signs: Temp: 98.5 F (36.9 C) (11/01 0630) Temp Source: Oral (11/01 0630) BP: 95/57 mmHg (11/01 0630) Pulse Rate: 92 (11/01 0739)  Labs:  Recent Labs  09/13/15 0515 09/14/15 0510 09/15/15 0530  HGB  --  9.4*  --   HCT  --  29.4*  --   PLT  --  303  --   LABPROT 27.9* 28.0* 28.7*  INR 2.65* 2.67* 2.76*  CREATININE 0.35* 0.30*  --     Estimated Creatinine Clearance: 104.4 mL/min (by C-G formula based on Cr of 0.3).   Medications:  Scheduled:  . antiseptic oral rinse  15 mL Mouth Rinse TID PC  . collagenase   Topical BID  . diclofenac sodium  2 g Topical QID  . famotidine  20 mg Oral BID  . feeding supplement (ENSURE ENLIVE)  237 mL Oral Q24H  . feeding supplement (PRO-STAT SUGAR FREE 64)  30 mL Oral TID  . furosemide  60 mg Oral Daily  . hydrocerin   Topical BID  . imipenem-cilastatin  500 mg Intravenous 4 times per day  . insulin aspart  0-5 Units Subcutaneous QHS  . insulin aspart  0-9 Units Subcutaneous TID WC  . magnesium oxide  800 mg Oral BID  . multivitamin with minerals  1 tablet Oral Daily  . oxyCODONE  2.5 mg Oral Q0600  . potassium chloride  20 mEq Oral TID  . sodium chloride  10 mL Intravenous Q12H  . tamsulosin  0.4 mg Oral QPC supper  . vitamin C  500 mg Oral BID  . Warfarin - Pharmacist Dosing Inpatient   Does not apply q1800   Infusions:    Assessment: [age] male with DVT is currently on therapeutic coumadin.  INR today is 2.76  Goal of Therapy:  INR 2-3 Monitor  platelets by anticoagulation protocol: Yes   Plan:  - coumadin 5 mg po x1 - INR in am  [place] Nordquist, Tsz-Yin 09/15/2015,8:15 AM

## 2015-09-15 NOTE — Consult Note (Signed)
Name: Jonathan Le MRN: [id number] DOB: 07-28-1946    ADMISSION DATE:  08/27/2015 CONSULTATION DATE:  09/15/15  REFERRING MD :  Dr. Wynn Banker / CIR   CHIEF COMPLAINT:  Tracheostomy status, consideration for decannulation   BRIEF PATIENT DESCRIPTION: 69 y/o M with recent prolonged critical illness requiring tracheostomy admitted 10/13 to rehab.  The patient has made significant progress and PCCM consulted for evaluation of de-cannulation of tracheostomy.    SIGNIFICANT EVENTS  9/6 - 9/13  Admit to [facility]  9/13  Admit to [facility]  STUDIES:     HISTORY OF PRESENT ILLNESS:  69 y/o M with PMH of HTN, CHF, CAD s/p CABG, Anxiety and OSA on CPAP (compliant) who recently had a prolonged hospitalization from July 2016 - 9/13 for CABG complicated postoperative course to include AF-RVR, right leg cellulitis, syncopal episode, septic shock with presumed ischemic colon s/p abdominal colectomy, return to OR visit for abdominal closure with ileostomy, sacral decubitus requiring ongoing hydrotherapy, suspected aspiration event, multiple intubations ultimately requiring tracheostomy, and abdominal abscesses s/p JP drain (per IR).    The patient was discharged to [facility] inpatient rehabilitation on 9/13. Since that time he has progressed with therapy to the point of standing with assistance. He is predominantly wheelchair bound but is able to participate with 3 hours of therapy per day. He continues to receive hydrotherapy for his sacral wound with anticipation of placement of a wound VAC. ID has continued to follow the patient for abdominal abscesses. Antibiotics have been changed to imipenem with close monitoring of drain output.  Currently he denies shortness of breath, chest pain, cough and sputum production. He does note occasionally when lying completely flat that he will have have an episode of coughing but quickly resolves. Prior to admission, he faithfully utilized his CPAP machine (machine  reviewed and noted compliance). The patient and family are hopeful to have tracheostomy removed. PCCM consulted for evaluation of decannulation.  PAST MEDICAL HISTORY :   has a past medical history of Anxiety; Hypertension; Arthritis; Sleep apnea; Glaucoma; CHF (congestive heart failure) (HCC); and S/P CABG (coronary artery bypass graft).  has past surgical history that includes Hernia repair; Eye surgery; Wisdom tooth extraction; Fasciotomy foot / toe (Left); Cardiac catheterization (N/A, 05/20/2015); Coronary artery bypass graft (N/A, 05/22/2015); TEE without cardioversion (N/A, 05/22/2015); laparotomy (N/A, 06/18/2015); laparotomy (N/A, 06/20/2015); and Tracheostomy tube placement (N/A, 07/03/2015).   Prior to Admission medications   Medication Sig Start Date End Date Taking? Authorizing Provider  Amino Acids-Protein Hydrolys (FEEDING SUPPLEMENT, PRO-STAT SUGAR FREE 64,) LIQD Place 30 mLs into feeding tube 5 (five) times daily. 07/25/15  Yes Jeanella Craze, NP  white petrolatum (VASELINE) GEL Apply 1 application topically daily. Apply to L nare wound daily 07/08/15  Yes Jeanella Craze, NP   Allergies  Allergen Reactions  . Unasyn [Ampicillin-Sulbactam Sodium] Hives, Itching and Rash    Diffuse rash not responding to Pepcid and Benadryl.  TDD.  Marland Kitchen Levaquin [Levofloxacin] Nausea Only  . Penicillins Hives  . Doxycycline Rash    FAMILY HISTORY:  family history is not on file.   SOCIAL HISTORY:  reports that he has never smoked. He has never used smokeless tobacco. He reports that he does not drink alcohol or use illicit drugs.  REVIEW OF SYSTEMS:   Constitutional: Negative for fever, chills, weight loss, malaise/fatigue and diaphoresis.  HENT: Negative for hearing loss, ear pain, nosebleeds, congestion, sore throat, neck pain, tinnitus and ear discharge.   Eyes: Negative  for blurred vision, double vision, photophobia, pain, discharge and redness.  Respiratory: Negative for cough, hemoptysis, sputum  production, shortness of breath, wheezing and stridor.   Cardiovascular: Negative for chest pain, palpitations, orthopnea, claudication, leg swelling and PND.  Gastrointestinal: Negative for heartburn, nausea, vomiting, abdominal pain, diarrhea, constipation, blood in stool and melena.  Genitourinary: Negative for dysuria, urgency, frequency, hematuria and flank pain.  Musculoskeletal: Negative for myalgias, back pain, joint pain and falls.  Skin: Negative for itching and rash.  Neurological: Negative for dizziness, tingling, tremors, sensory change, speech change, focal weakness, seizures, loss of consciousness, weakness and headaches.  Endo/Heme/Allergies: Negative for environmental allergies and polydipsia. Does not bruise/bleed easily.  SUBJECTIVE:   VITAL SIGNS: Temp:  [97.1 F (36.2 C)-98.5 F (36.9 C)] 97.1 F (36.2 C) (11/01 1439) Pulse Rate:  [82-99] 99 (11/01 1439) Resp:  [16-18] 18 (11/01 1439) BP: (95-110)/(54-75) 110/75 mmHg (11/01 1439) SpO2:  [94 %-100 %] 100 % (11/01 1439) Weight:  [239 lb 6.7 oz (108.6 kg)] 239 lb 6.7 oz (108.6 kg) (11/01 0630)  PHYSICAL EXAMINATION: General:  Obese male in NAD, up sitting in chair  Neuro:  AAOx4, speech clear, MAE  HEENT:  MM pink/moist, no jvd, #6XLT proximal trach in place Cardiovascular:  s1s2 rrr, soft SEM  Lungs:  resp's even/non-labored, lungs bilaterally clear, PMV in place with strong voice Abdomen:  Obese / soft, bsx4 active, JP drains x3 on L side, ostomy with bilious drainage Musculoskeletal:  No acute deformities, generalized weakness  Skin:  Pt reported sacral ulcer (not visualized)   Recent Labs Lab 09/13/15 0515 09/14/15 0510  NA 134* 138  K 3.6 3.4*  CL 99* 102  CO2 29 29  BUN 7 9  CREATININE 0.35* 0.30*  GLUCOSE 87 87    Recent Labs Lab 09/14/15 0510  HGB 9.4*  HCT 29.4*  WBC 6.4  PLT 303   No results found.  ASSESSMENT / PLAN:  Chronic Respiratory Failure - s/p prolonged critical illness    Tracheostomy Status - in setting of resolving critical illness, #6 XLT proximal.  Concern with discontinuing trach given he is non-ambulatory and continues to have abdominal drains in place.  Ideally, would like to see him up walking before decannulation.   Hx Aspiration Event   Plan: Cap trach x 48 hours Nocturnal CPAP with trach capped, have discussed with RT  D/C trach cap if patient has respiratory difficulty (SOB), desaturations etc If he tolerates trial of capping trach, will likely discontinue trach.  Ideally, would like to see him up walking before decannulation. Pulmonary hygiene as tolerated   Canary BrimBrandi Irine Heminger, NP-C [facility] Pgr: [phone] or if no answer [phone] 09/15/2015, 2:51 PM

## 2015-09-15 NOTE — Progress Notes (Signed)
Nutrition Follow-up  DOCUMENTATION CODES:   Obesity unspecified  INTERVENTION:   Discontinue Ensure.   Continue 30 ml Prostat po TID, each supplement provides 100 kcal and 15 grams of protein.   Encourage adequate PO intake.   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 He required total colectomy with ileostomy Abdominal incision closing in well and multiple ulcers--bilateral heels, left thigh, and trach sites clean and dry. Stage III Sacrococcygeal wound -debrided at bedside on 10/05 and currently reported to be 15 X 15 with 1.8 cm deep .  Meal completion has been 100%. Intake has been adequate. Pt currently has Ensure and Prostat ordered. RD to discontinue Ensure as pt has been refusing majority of the time. RD to continue with Prostat to aid in wound healing.   Labs and medications reviewed.   Diet Order:  Diet regular Room service appropriate?: Yes; Fluid consistency:: Thin; Fluid restriction:: 1500 mL Fluid  Skin:  Wound (see comment) (Unstg ulcer L heel, R coccyx, Stg III on buttocks)  Last BM:  11/1 ileostomy  Height:   Ht Readings from Last 1 Encounters:  08/27/15 '5\' 9"'  (1.753 m)    Weight:   Wt Readings from Last 1 Encounters:  09/15/15 239 lb 6.7 oz (108.6 kg)    Ideal Body Weight:  72.7 kg  BMI:  Body mass index is 35.34 kg/(m^2).  Estimated Nutritional Needs:   Kcal:  2100-2300  Protein:  135-150  grams  Fluid:  2.1 - 2.3 L/day  EDUCATION NEEDS:   No education needs identified at this time  Corrin Parker, MS, RD, LDN Pager # [phone] After hours/ weekend pager # [phone]

## 2015-09-15 NOTE — Progress Notes (Signed)
Physical Therapy Wound Treatment Patient Details  Name: Jonathan Le MRN: [id number] Date of Birth: Oct 16, 1946  Today's Date: 09/15/2015 Time: 9688-6484 Time Calculation (min): 36 min  Subjective  Subjective: Pt asking about the [facility] Patient and Family Stated Goals: Get better Date of Onset:  (prior to this hospitialization) Prior Treatments: I&D, dressing changes  Pain Score:    Wound Assessment  Pressure Ulcer 07/21/15 Stage III -  Full thickness tissue loss. Subcutaneous fat may be visible but bone, tendon or muscle are NOT exposed. (Active)  Dressing Type Gauze (Comment);Moist to dry;Tape dressing;Other (Comment) 09/15/2015  2:10 PM  Dressing Changed;Clean;Dry;Intact 09/15/2015  2:10 PM  Dressing Change Frequency Twice a day 09/15/2015  2:10 PM  State of Healing Early/partial granulation 09/15/2015  2:10 PM  Site / Wound Assessment Pink;Red;Yellow 09/15/2015  2:10 PM  % Wound base Red or Granulating 90% 09/15/2015  2:10 PM  % Wound base Yellow 10% 09/15/2015  2:10 PM  % Wound base Black 0% 09/15/2015  2:10 PM  % Wound base Other (Comment) 0% 09/15/2015  2:10 PM  Peri-wound Assessment Intact 09/15/2015  2:10 PM  Wound Length (cm) 8 cm 09/15/2015  2:10 PM  Wound Width (cm) 8.8 cm 09/15/2015  2:10 PM  Wound Depth (cm) 4.5 cm 09/15/2015  2:10 PM  Tunneling (cm) 6.0 cm from  12 to 2 o'clock 09/15/2015  2:10 PM  Margins Unattached edges (unapproximated) 09/15/2015  2:10 PM  Drainage Amount Moderate 09/15/2015  2:10 PM  Drainage Description Serosanguineous 09/15/2015  2:10 PM  Treatment Debridement (Selective);Hydrotherapy (Pulse lavage);Packing (Saline gauze) 09/15/2015  2:10 PM      Hydrotherapy Pulsed lavage therapy - wound location: sacrum/buttocks Pulsed Lavage with Suction (psi): 8 psi (4-8 psi) Pulsed Lavage with Suction - Normal Saline Used: 1000 mL Pulsed Lavage Tip: Tip with splash shield Selective Debridement Selective Debridement - Location: sacrum/buttocks Selective Debridement -  Tools Used: Forceps;Scissors Selective Debridement - Tissue Removed: yellow fibrous necrotic tissue   Wound Assessment and Plan  Wound Therapy - Assess/Plan/Recommendations Wound Therapy - Clinical Statement: Wound bed looking really good. Feel pt is ready for VAC. Wound Therapy - Functional Problem List: Pt with decr sitting tolerance due to sacral wound. Factors Delaying/Impairing Wound Healing: Immobility;Multiple medical problems;Polypharmacy Hydrotherapy Plan: Debridement;Dressing change;Patient/family education;Pulsatile lavage with suction Wound Therapy - Frequency: 6X / week Wound Therapy - Follow Up Recommendations: Home health RN Wound Plan: See above  Wound Therapy Goals- Improve the function of patient's integumentary system by progressing the wound(s) through the phases of wound healing (inflammation - proliferation - remodeling) by: Decrease Necrotic Tissue to: 5 Decrease Necrotic Tissue - Progress: Goal set today Increase Granulation Tissue to: 95 Increase Granulation Tissue - Progress: Goal set today Goals/treatment plan/discharge plan were made with and agreed upon by patient/family: Yes Time For Goal Achievement: 7 days Wound Therapy - Potential for Goals: Good  Goals will be updated until maximal potential achieved or discharge criteria met.  Discharge criteria: when goals achieved, discharge from hospital, MD decision/surgical intervention, no progress towards goals, refusal/missing three consecutive treatments without notification or medical reason.  GP     Ashliegh Parekh 09/15/2015, 2:15 PM  [facility] PT [phone]

## 2015-09-15 NOTE — Progress Notes (Signed)
Subjective/Complaints: Patient reportedly stood in therapy yesterday, discussed therapy schedule with OT. Still on 15/7                                                                                                                                                                                                            ROS:   Denies CP, SOB, n/v/d. Strength improving. No insomnia, anxiety, appetite ok, pain controlled  Objective: Vital Signs: Blood pressure 95/57, pulse 92, temperature 98.5 F (36.9 C), temperature source Oral, resp. rate 16, height '5\' 9"'  (1.753 m), weight 108.6 kg (239 lb 6.7 oz), SpO2 94 %. No results found. Results for orders placed or performed during the hospital encounter of 08/27/15 (from the past 72 hour(s))  Glucose, capillary     Status: Abnormal   Collection Time: 09/12/15 12:04 PM  Result Value Ref Range   Glucose-Capillary 103 (H) 65 - 99 mg/dL  Glucose, capillary     Status: Abnormal   Collection Time: 09/12/15  4:25 PM  Result Value Ref Range   Glucose-Capillary 103 (H) 65 - 99 mg/dL  Glucose, capillary     Status: None   Collection Time: 09/12/15  9:27 PM  Result Value Ref Range   Glucose-Capillary 90 65 - 99 mg/dL  Protime-INR     Status: Abnormal   Collection Time: 09/13/15  5:15 AM  Result Value Ref Range   Prothrombin Time 27.9 (H) 11.6 - 15.2 seconds   INR 2.65 (H) 0.00 - 3.78  Basic metabolic panel     Status: Abnormal   Collection Time: 09/13/15  5:15 AM  Result Value Ref Range   Sodium 134 (L) 135 - 145 mmol/L   Potassium 3.6 3.5 - 5.1 mmol/L   Chloride 99 (L) 101 - 111 mmol/L   CO2 29 22 - 32 mmol/L   Glucose, Bld 87 65 - 99 mg/dL   BUN 7 6 - 20 mg/dL   Creatinine, Ser 0.35 (L) 0.61 - 1.24 mg/dL   Calcium 8.0 (L) 8.9 - 10.3 mg/dL   GFR calc non Af Amer >60 >60 mL/min   GFR calc Af Amer >60 >60 mL/min    Comment: (NOTE) The eGFR has been calculated using the CKD EPI equation. This calculation has not been validated in all clinical  situations. eGFR's persistently <60 mL/min signify possible Chronic Kidney Disease.    Anion gap 6 5 - 15  Glucose, capillary     Status: None   Collection Time: 09/13/15  7:02 AM  Result Value Ref Range   Glucose-Capillary  90 65 - 99 mg/dL  Glucose, capillary     Status: None   Collection Time: 09/13/15 12:14 PM  Result Value Ref Range   Glucose-Capillary 97 65 - 99 mg/dL  Glucose, capillary     Status: Abnormal   Collection Time: 09/13/15  4:33 PM  Result Value Ref Range   Glucose-Capillary 104 (H) 65 - 99 mg/dL  Glucose, capillary     Status: Abnormal   Collection Time: 09/13/15  9:46 PM  Result Value Ref Range   Glucose-Capillary 101 (H) 65 - 99 mg/dL  Protime-INR     Status: Abnormal   Collection Time: 09/14/15  5:10 AM  Result Value Ref Range   Prothrombin Time 28.0 (H) 11.6 - 15.2 seconds   INR 2.67 (H) 0.00 - 1.49  CBC with Differential/Platelet     Status: Abnormal   Collection Time: 09/14/15  5:10 AM  Result Value Ref Range   WBC 6.4 4.0 - 10.5 K/uL    Comment: REPEATED TO VERIFY CONSISTENT WITH PREVIOUS RESULT    RBC 3.30 (L) 4.22 - 5.81 MIL/uL   Hemoglobin 9.4 (L) 13.0 - 17.0 g/dL    Comment: REPEATED TO VERIFY CONSISTENT WITH PREVIOUS RESULT    HCT 29.4 (L) 39.0 - 52.0 %   MCV 89.1 78.0 - 100.0 fL   MCH 28.5 26.0 - 34.0 pg   MCHC 32.0 30.0 - 36.0 g/dL   RDW 16.2 (H) 11.5 - 15.5 %   Platelets 303 150 - 400 K/uL    Comment: REPEATED TO VERIFY CONSISTENT WITH PREVIOUS RESULT    Neutrophils Relative % 38 %   Lymphocytes Relative 32 %   Monocytes Relative 11 %   Eosinophils Relative 17 %   Basophils Relative 2 %   Neutro Abs 2.5 1.7 - 7.7 K/uL   Lymphs Abs 2.0 0.7 - 4.0 K/uL   Monocytes Absolute 0.7 0.1 - 1.0 K/uL   Eosinophils Absolute 1.1 (H) 0.0 - 0.7 K/uL   Basophils Absolute 0.1 0.0 - 0.1 K/uL   Smear Review MORPHOLOGY UNREMARKABLE   Basic metabolic panel     Status: Abnormal   Collection Time: 09/14/15  5:10 AM  Result Value Ref Range    Sodium 138 135 - 145 mmol/L   Potassium 3.4 (L) 3.5 - 5.1 mmol/L   Chloride 102 101 - 111 mmol/L   CO2 29 22 - 32 mmol/L   Glucose, Bld 87 65 - 99 mg/dL   BUN 9 6 - 20 mg/dL   Creatinine, Ser 0.30 (L) 0.61 - 1.24 mg/dL   Calcium 8.1 (L) 8.9 - 10.3 mg/dL   GFR calc non Af Amer >60 >60 mL/min   GFR calc Af Amer >60 >60 mL/min    Comment: (NOTE) The eGFR has been calculated using the CKD EPI equation. This calculation has not been validated in all clinical situations. eGFR's persistently <60 mL/min signify possible Chronic Kidney Disease.    Anion gap 7 5 - 15  Pathologist smear review     Status: None   Collection Time: 09/14/15  5:10 AM  Result Value Ref Range   Path Review Elevated basophils.     Comment: Reviewed by Casimer Lanius, MD 09/14/2015   Glucose, capillary     Status: None   Collection Time: 09/14/15  7:06 AM  Result Value Ref Range   Glucose-Capillary 97 65 - 99 mg/dL  Glucose, capillary     Status: Abnormal   Collection Time: 09/14/15 11:16 AM  Result Value Ref  Range   Glucose-Capillary 115 (H) 65 - 99 mg/dL  Glucose, capillary     Status: Abnormal   Collection Time: 09/14/15  4:17 PM  Result Value Ref Range   Glucose-Capillary 111 (H) 65 - 99 mg/dL  Glucose, capillary     Status: None   Collection Time: 09/14/15  9:55 PM  Result Value Ref Range   Glucose-Capillary 94 65 - 99 mg/dL  Protime-INR     Status: Abnormal   Collection Time: 09/15/15  5:30 AM  Result Value Ref Range   Prothrombin Time 28.7 (H) 11.6 - 15.2 seconds   INR 2.76 (H) 0.00 - 1.49  Glucose, capillary     Status: None   Collection Time: 09/15/15  6:52 AM  Result Value Ref Range   Glucose-Capillary 89 65 - 99 mg/dL    Nursing note and vitals reviewed. Constitutional: He is oriented to person, place, and time.he is slower today He appears well-developed and well-nourished. No distress.  HENT:  Head: Normocephalic and atraumatic.  Eyes: Conjunctivae and EOM are normal.   Neck:  Decreased range of motion present.  Trach with dry dressing in place. #6--able to speak with PMV Cardiovascular: Normal rate and regular rhythm. noES Respiratory: Effort normal and breath sounds normal. No respiratory distress. He has no wheezes.    GI: Soft. Bowel sounds are normal.  Non-tender. Midline incision has healed . Ostomy with bilious drainage which appears decreased. Multiple drains LLQ sutured in place.no erythema  Genitourinary:  Foley in place.  Musculoskeletal: He exhibits edema b/l LE  Right knee with effusion. No tenderness.  Skin: Left lower quadrant drains intact. Eschar left heel, right heel grade 1 Right lower shin with stasis changes. Multiple ulcers on bilateral heels Diffuse macular rash Neurological: He is alert and oriented to person, place, and time.    Speech clear. Follows basic commands without difficulty.  Psychiatric: He has a normal mood and affect. His speech is normal and behavior is normal. Cognition and memory are normal.  Strength Antigravity through, except b/l hip flexors.   Sensation diminished in feet.    Assessment/Plan: 1. 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 Physiatrist is providing close team supervision and 24 hour management of active medical problems listed below. Physiatrist and rehab team continue to assess barriers to discharge/monitor patient progress toward functional and medical goals. Patient may have drains removed if output diminishes over the next 3-5 days and CT scan shows resolution of abscess. Ask pulmonary to reevaluate next weekFor potential decannulation Should be done with hydrotherapy pretty soon and sacral wound can be managed with Wound VAC FIM: Function - Bathing Position: Sitting EOB Body parts bathed by patient: Right  arm, Left arm, Chest, Right upper leg, Left upper leg, Right lower leg, Left lower leg, Front perineal area, Abdomen Body parts bathed by helper: Back, Buttocks Bathing not applicable: Abdomen Assist Level: Assistive device Assistive Device Comment: LH sponge and reacher  Function- Upper Body Dressing/Undressing What is the patient wearing?: [place] over shirt/dress - Perfomed by patient: Thread/unthread right sleeve, Thread/unthread left sleeve, Put head through opening, Pull shirt over trunk Pull over shirt/dress - Perfomed by helper: Put head through opening Assist Level: Supervision or verbal cues Function - Lower Body Dressing/Undressing What is the patient wearing?: Non-skid slipper socks Position: Bed Underwear - Performed by helper: Thread/unthread right underwear leg, Thread/unthread  left underwear leg, Pull underwear up/down Pants- Performed by patient: Thread/unthread right pants leg, Thread/unthread left pants leg Pants- Performed by helper: Pull pants up/down Non-skid slipper socks- Performed by patient: Don/doff right sock (doff only) Non-skid slipper socks- Performed by helper: Don/doff left sock Socks - Performed by helper: Don/doff right sock, Don/doff left sock Assist for footwear: Dependant Assist for lower body dressing: Touching or steadying assistance (Pt > 75%)  Function - Toileting Toileting activity did not occur: Safety/medical concerns Assist level: Two helpers (per [place], NT report)  Function - Air cabin crew transfer activity did not occur: Safety/medical concerns  Function - Chair/bed transfer Chair/bed transfer activity did not occur: Safety/medical concerns Chair/bed transfer method: Lateral scoot Chair/bed transfer assist level: 2 helpers Chair/bed transfer assistive device: Armrests, Sliding board, Other (step under feet) Mechanical lift: Maximove Chair/bed transfer details: Verbal cues for safe use of DME/AE, Verbal cues for  sequencing, Verbal cues for technique, Verbal cues for precautions/safety, Manual facilitation for weight shifting, Manual facilitation for placement  Function - Locomotion: Wheelchair Will patient use wheelchair at discharge?: Yes Type: Manual Wheelchair activity did not occur: Safety/medical concerns Max wheelchair distance: 75 Assist Level: Supervision or verbal cues Wheel 50 feet with 2 turns activity did not occur: Safety/medical concerns Assist Level: Supervision or verbal cues Wheel 150 feet activity did not occur: Safety/medical concerns Turns around,maneuvers to table,bed, and toilet,negotiates 3% grade,maneuvers on rugs and over doorsills: No Function - Locomotion: Ambulation Ambulation activity did not occur: Safety/medical concerns Walk 10 feet activity did not occur: Safety/medical concerns Walk 50 feet with 2 turns activity did not occur: Safety/medical concerns Walk 150 feet activity did not occur: Safety/medical concerns Walk 10 feet on uneven surfaces activity did not occur: Safety/medical concerns  Function - Comprehension Comprehension: Auditory Comprehension assist level: Understands complex 90% of the time/cues 10% of the time  Function - Expression Expression: Verbal Expression assistive device: Talk trach valve Expression assist level: Expresses basic 90% of the time/requires cueing < 10% of the time.  Function - Social Interaction Social Interaction assist level: Interacts appropriately 90% of the time - Needs monitoring or encouragement for participation or interaction.  Function - Problem Solving Problem solving assist level: Solves basic 75 - 89% of the time/requires cueing 10 - 24% of the time  Function - Memory Memory assist level: Recognizes or recalls 90% of the time/requires cueing < 10% of the time Patient normally able to recall (first 3 days only): Current season, Location of own room, Staff names and faces, That he or she is in a hospital  1.  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    2. LLE DVT treatment /Anticoagulation: Pharmaceutical: Coumadin---therapeutic   3. Pain Management: Tylenol for prn for pain. Will continue oxycodone prn if needed for severe pain.  -would benefit from scheduled oxy prior to am therapies---will schedule one hour ahead 4. Mood: LCSW to follow for evaluation and support.  5. Neuropsych: This patient is capable of making decisions on his own behalf. 6. Skin/Wound Care:   sacral wound debridement by surgery  - PRAFO ordered for heels, Cont air bed , Once  Hydrotherapy is d/ced,  place wound VAC, in am  -reviewed concept of serous drainage with patient 7. Fluids/Electrolytes/Nutrition: Monitor I/O. Will consult dietician to help with food choices. Increase prosource to TID. Added ensure between meals as unable to tolerate a lot of food at one  time.   - Pt eating 100% of meals.  8. Abdominal abscess: Continue drain care bid.   ID on consult, Pt refused contrast has some improvement of at least one abscess with R drain removed, switched to imipenem, IR following drain output, repeat CT abd when output ~80m/day, does have 165mflush per day each catheter Allergic to PCN (unasyn rash), per surgery may not need further abx, ask ID to re eval 9. Hypokalemia: Stable at 3.6 today. I personally reviewed the patient's labs today.  10. VDRF/ History of OSA: To continue current trach till mobility improves per CCM. Tolerating humidified air. We will need to reconsult pulmonary before decannulationmobility improving, ask pulm to eval, has extra long trach #5, Don't think there is a number 4 available in this length                                   11. Anxiety disorder: resumed low dose Xanax as used this for insomnia at home. given his respiratory compromise will need to limit dosing.   Improving.  12. Anemia: Likely due to infection/chronic illness. Hgb stable at 9.3 on 10/24.  Will cont to monitor.                      13. Anasarca: Improved Continue Lasix, protein supplements.  14. Diastolic CHF: Off amiodarone, Ace and statin at this time due to recurrent hypotension and multiple medical issues. Monitor for signs of overload. Weights stable.  15.  Hypomagnesemia-. Magnesium 80081mID, repeat Mg level on 10/24, normal at 1.7        16.. UMarland Kitcheninary retention voiding trial , no I/O cath needed LOS (Days) 19 A FACE TO FACE EVALUATION WAS PERFORMED Reynalda Canny E 09/15/2015, 8:38 AM

## 2015-09-15 NOTE — Progress Notes (Signed)
Discussed patient's antibiotic regimen as well as CCS recommendations to d/c antibiotics with Dr. Daiva EvesVan Dam. He is willing discontinue antibiotics and monitor for now as patient has had prolonged antibiotic treatment, no fevers and no elevated WBC.  To re-culture and repeat Abdomen CT if patient shows signs of infection.    Discussed patient with Dr. Vassie LollAlva who does not feel that patient is a candidate for de-cannulation based on OSA history and ongoing debility. He will review patient and give input as appropriate.   Discussed patient with Beckey DowningPam Turpin, PA for radiology who has been following patient for input on resolution. Patient still has about 60 cc out put (100-40) in 24 hours period. Output needs to be less than 20 cc/24 hours prior to drain removal. She'll follow up in the next 24 hours and order repeat abdominal CT for later this week.

## 2015-09-15 NOTE — Progress Notes (Signed)
Referring Physician(s): Derrell LollingRamirez  Chief Complaint: abd abscess   Subjective:  abd drains x 3 intact 1 placed 9/6 2 placed 9/29 Still with significant output Pt so much better daily  Allergies: Unasyn; Levaquin; Penicillins; and Doxycycline  Medications: Prior to Admission medications   Medication Sig Start Date End Date Taking? Authorizing Provider  Amino Acids-Protein Hydrolys (FEEDING SUPPLEMENT, PRO-STAT SUGAR FREE 64,) LIQD Place 30 mLs into feeding tube 5 (five) times daily. 07/25/15  Yes [name], NP  white petrolatum (VASELINE) GEL Apply 1 application topically daily. Apply to L nare wound daily 07/08/15  Yes [name], NP     Vital Signs: BP 95/57 mmHg  Pulse 92  Temp(Src) 98.5 F (36.9 C) (Oral)  Resp 16  Ht 5\' 9"  (1.753 m)  Wt 239 lb 6.7 oz (108.6 kg)  BMI 35.34 kg/m2  SpO2 94%  Physical Exam  Abdominal: Soft. Bowel sounds are normal. He exhibits no distension. There is no tenderness.  Left abd drains intact All clean and dry Output #1: 60 cc yesterday             #2: 25 cc             #3: 15 cc All serous in color   Skin: Skin is warm and dry.  Nursing note and vitals reviewed.   Imaging: No results found.  Labs:  CBC:  Recent Labs  08/20/15 0648 08/28/15 0430 09/07/15 0433 09/14/15 0510  WBC 5.2 6.6 6.3 6.4  HGB 8.8* 8.2* 9.3* 9.4*  HCT 29.3* 26.9* 31.2* 29.4*  PLT 328 349 311 303    COAGS:  Recent Labs  07/21/15 0815  07/23/15 1200 07/25/15 0419 07/30/15 0627  09/12/15 0630 09/13/15 0515 09/14/15 0510 09/15/15 0530  INR 1.65*  < > 2.62* 1.48 1.35  < > 2.46* 2.65* 2.67* 2.76*  APTT 29  --  38* 28 31  --   --   --   --   --   < > = values in this interval not displayed.  BMP:  Recent Labs  08/31/15 0445 09/07/15 0433 09/13/15 0515 09/14/15 0510  NA 135 137 134* 138  K 4.0 3.5 3.6 3.4*  CL 98* 103 99* 102  CO2 32 30 29 29   GLUCOSE 94 95 87 87  BUN 10 9 7 9   CALCIUM 8.1* 8.1* 8.0* 8.1*    CREATININE 0.33* 0.37* 0.35* 0.30*  GFRNONAA >60 >60 >60 >60  GFRAA >60 >60 >60 >60    LIVER FUNCTION TESTS:  Recent Labs  07/23/15 0600 07/24/15 0345 07/25/15 0419  07/28/15 0630 08/02/15 0620 08/16/15 0701 08/28/15 0430  BILITOT 1.0 0.7 0.9  --   --   --   --  0.6  AST 30 40 35  --   --   --   --  22  ALT 15* 15* 14*  --   --   --   --  10*  ALKPHOS 154* 167* 199*  --   --   --   --  80  PROT 4.6* 4.3* 4.5*  --   --   --   --  5.2*  ALBUMIN <1.0* <1.0* <1.0*  < > 1.4* 1.2* 1.2* 1.6*  < > = values in this interval not displayed.  Assessment and Plan:  Abd abscess drain intact Consider Re CT when output less than 15 cc per day Output still 60 cc in #1 (15 cc flush daily) Will follow Can  follow in IR drain clinic if feel appropriate  Signed: Matsue Strom A 09/15/2015, 2:24 PM   I spent a total of  15 min at the the patient's bedside AND on the patient's hospital floor or unit, greater than 50% of which was counseling/coordinating care for absc ess drains

## 2015-09-16 ENCOUNTER — Inpatient Hospital Stay (HOSPITAL_COMMUNITY): Payer: Medicare Other | Admitting: Occupational Therapy

## 2015-09-16 ENCOUNTER — Ambulatory Visit (HOSPITAL_COMMUNITY): Payer: Medicare Other

## 2015-09-16 ENCOUNTER — Inpatient Hospital Stay (HOSPITAL_COMMUNITY): Payer: Medicare Other

## 2015-09-16 LAB — PROTIME-INR
INR: 2.81 — AB (ref 0.00–1.49)
PROTHROMBIN TIME: 29.2 s — AB (ref 11.6–15.2)

## 2015-09-16 LAB — GLUCOSE, CAPILLARY
GLUCOSE-CAPILLARY: 84 mg/dL (ref 65–99)
GLUCOSE-CAPILLARY: 102 mg/dL — AB (ref 65–99)
Glucose-Capillary: 116 mg/dL — ABNORMAL HIGH (ref 65–99)
Glucose-Capillary: 96 mg/dL (ref 65–99)

## 2015-09-16 MED ORDER — WARFARIN SODIUM 2.5 MG PO TABS
2.5000 mg | ORAL_TABLET | Freq: Once | ORAL | Status: AC
  Administered 2015-09-16: 2.5 mg via ORAL
  Filled 2015-09-16: qty 1

## 2015-09-16 MED ORDER — FUROSEMIDE 20 MG PO TABS
20.0000 mg | ORAL_TABLET | Freq: Every day | ORAL | Status: DC
  Administered 2015-09-19: 20 mg via ORAL
  Filled 2015-09-16 (×3): qty 1

## 2015-09-16 MED ORDER — FUROSEMIDE 40 MG PO TABS
40.0000 mg | ORAL_TABLET | Freq: Every day | ORAL | Status: DC
Start: 2015-09-17 — End: 2015-09-25
  Administered 2015-09-17 – 2015-09-24 (×8): 40 mg via ORAL
  Filled 2015-09-16 (×8): qty 1
  Filled 2015-09-16: qty 2
  Filled 2015-09-16: qty 1

## 2015-09-16 MED ORDER — FINASTERIDE 5 MG PO TABS: 5.0000 mg | ORAL_TABLET | Freq: Every day | ORAL | Status: DC

## 2015-09-16 MED ORDER — POTASSIUM CHLORIDE CRYS ER 20 MEQ PO TBCR
40.0000 meq | EXTENDED_RELEASE_TABLET | Freq: Two times a day (BID) | ORAL | Status: DC
  Administered 2015-09-16 – 2015-09-23 (×15): 40 meq via ORAL
  Filled 2015-09-16 (×10): qty 2
  Filled 2015-09-16: qty 4
  Filled 2015-09-16 (×6): qty 2

## 2015-09-16 MED ORDER — FINASTERIDE 5 MG PO TABS
5.0000 mg | ORAL_TABLET | Freq: Every day | ORAL | Status: DC
  Administered 2015-09-16 – 2015-09-25 (×10): 5 mg via ORAL
  Filled 2015-09-16 (×14): qty 1

## 2015-09-16 NOTE — Progress Notes (Signed)
Occupational Therapy Session Note  Patient Details  Name: Jonathan Le MRN: [id number] Date of Birth: 06/28/1946  Today's Date: 09/16/2015 OT Individual Time: [id number] OT Individual Time Calculation (min): 59 min    Short Term Goals: Week 3:  OT Short Term Goal 1 (Week 3): Pt will donn pullover shirt EOB with close supervision. OT Short Term Goal 2 (Week 3): Pt will transition sidelying to sitting in order to complete seltcare tasks with mod assist 2 consecutive sessions OT Short Term Goal 3 (Week 3): Pt will perform sliding board transfer from bed to wheelchair with max assist. OT Short Term Goal 4 (Week 3): Pt will roll to the left with min assist and to the right with supervision during LB selfcare tasks.   Skilled Therapeutic Interventions/Progress Updates:    Session 1 Pt performed bathing and during session supine to sit EOB.  He was able to roll to the left with rails this session, in order for therapist to assist with peri care, with min assist.  He was able to roll to the right side with supervision.  Mod assist needed for transition to sidelying to sitting and sitting to sidelying.  Pt still with decreased UE strengthening for pushing up from sidelying.  Pt completed all UB bathing and dressing with supervision in sitting.  Returned back to supine after completion of UB.  Pt did not attempt dressing of LB secondary to having hydrotherapy coming later in the morning.    Session 2 Pt worked on [employer]LB dressing to [employer] shorts and gripper socks to begin session.  Total assist to donn shorts over feet in supine and then pull over hips with rolling in the bed.  Min assist for rolling but pt still tries to reach for rail before bending up his LEs first.  Transitioned to sitting with max assist from sidelying and then therapist wrapped bilateral knees with ace bandages.  Worked on standing during session with use of the [employer] with walking sling for 3 intervals of 1 min 30 seconds, 2 mins,  and 2 mins 10 seconds.  Used urinal in standing as well with max assist from therapist for placement.  Pt urinated approximately 50 ccs.  Transitioned to wheelchair on last standing attempt.  Pt left with call button, phone, and urinal all within needs.    Therapy Documentation Precautions:  Precautions Precautions: Fall Precaution Comments: sacral decub, trach, drain lines from abdomen Restrictions Weight Bearing Restrictions: No Other Position/Activity Restrictions: trach collar  Pain: Pain Assessment Pain Assessment: No/denies pain ADL: See Function Navigator for Current Functional Status.   Therapy/Group: Individual Therapy  Kaden Daughdrill OTR/L 09/16/2015, 10:12 AM

## 2015-09-16 NOTE — Progress Notes (Signed)
Subjective/Complaints: Appreciate pulmonary note, appreciate interventional radiology note. Patient states he did not use CPAP last night. Order is in the chart.                                                                                                                                                                                                            ROS:   Denies CP, SOB, n/v/d. Strength improving. No insomnia, anxiety, appetite ok, pain controlled  Objective: Vital Signs: Blood pressure 91/50, pulse 90, temperature 97.5 F (36.4 C), temperature source Oral, resp. rate 18, height _0  (1.753 m), weight 107.1 kg (236 lb 1.8 oz), SpO2 95 %. No results found. Results for orders placed or performed during the hospital encounter of 08/27/15 (from the past 72 hour(s))  Glucose, capillary     Status: None   Collection Time: 09/13/15 12:14 PM  Result Value Ref Range   Glucose-Capillary 97 65 - 99 mg/dL  Glucose, capillary     Status: Abnormal   Collection Time: 09/13/15  4:33 PM  Result Value Ref Range   Glucose-Capillary 104 (H) 65 - 99 mg/dL  Glucose, capillary     Status: Abnormal   Collection Time: 09/13/15  9:46 PM  Result Value Ref Range   Glucose-Capillary 101 (H) 65 - 99 mg/dL  Protime-INR     Status: Abnormal   Collection Time: 09/14/15  5:10 AM  Result Value Ref Range   Prothrombin Time 28.0 (H) 11.6 - 15.2 seconds   INR 2.67 (H) 0.00 - 1.49  CBC with Differential/Platelet     Status: Abnormal   Collection Time: 09/14/15  5:10 AM  Result Value Ref Range   WBC 6.4 4.0 - 10.5 K/uL    Comment: REPEATED TO VERIFY CONSISTENT WITH PREVIOUS RESULT    RBC 3.30 (L) 4.22 - 5.81 MIL/uL   Hemoglobin 9.4 (L) 13.0 - 17.0 g/dL    Comment: REPEATED TO VERIFY CONSISTENT WITH PREVIOUS RESULT    HCT 29.4 (L) 39.0 - 52.0 %   MCV 89.1 78.0 - 100.0 fL   MCH 28.5 26.0 - 34.0 pg   MCHC 32.0 30.0 - 36.0 g/dL   RDW 16.2 (H) 11.5 - 15.5 %   Platelets 303 150 - 400 K/uL    Comment:  REPEATED TO VERIFY CONSISTENT WITH PREVIOUS RESULT    Neutrophils Relative % 38 %   Lymphocytes Relative 32 %   Monocytes Relative 11 %   Eosinophils Relative 17 %   Basophils Relative 2 %   Neutro Abs 2.5 1.7 -  7.7 K/uL   Lymphs Abs 2.0 0.7 - 4.0 K/uL   Monocytes Absolute 0.7 0.1 - 1.0 K/uL   Eosinophils Absolute 1.1 (H) 0.0 - 0.7 K/uL   Basophils Absolute 0.1 0.0 - 0.1 K/uL   Smear Review MORPHOLOGY UNREMARKABLE   Basic metabolic panel     Status: Abnormal   Collection Time: 09/14/15  5:10 AM  Result Value Ref Range   Sodium 138 135 - 145 mmol/L   Potassium 3.4 (L) 3.5 - 5.1 mmol/L   Chloride 102 101 - 111 mmol/L   CO2 29 22 - 32 mmol/L   Glucose, Bld 87 65 - 99 mg/dL   BUN 9 6 - 20 mg/dL   Creatinine, Ser 0.30 (L) 0.61 - 1.24 mg/dL   Calcium 8.1 (L) 8.9 - 10.3 mg/dL   GFR calc non Af Amer >60 >60 mL/min   GFR calc Af Amer >60 >60 mL/min    Comment: (NOTE) The eGFR has been calculated using the CKD EPI equation. This calculation has not been validated in all clinical situations. eGFR's persistently <60 mL/min signify possible Chronic Kidney Disease.    Anion gap 7 5 - 15  Pathologist smear review     Status: None   Collection Time: 09/14/15  5:10 AM  Result Value Ref Range   Path Review Elevated basophils.     Comment: Reviewed by Casimer Lanius, MD 09/14/2015   Glucose, capillary     Status: None   Collection Time: 09/14/15  7:06 AM  Result Value Ref Range   Glucose-Capillary 97 65 - 99 mg/dL  Glucose, capillary     Status: Abnormal   Collection Time: 09/14/15 11:16 AM  Result Value Ref Range   Glucose-Capillary 115 (H) 65 - 99 mg/dL  Glucose, capillary     Status: Abnormal   Collection Time: 09/14/15  4:17 PM  Result Value Ref Range   Glucose-Capillary 111 (H) 65 - 99 mg/dL  Glucose, capillary     Status: None   Collection Time: 09/14/15  9:55 PM  Result Value Ref Range   Glucose-Capillary 94 65 - 99 mg/dL  Protime-INR     Status: Abnormal   Collection Time:  09/15/15  5:30 AM  Result Value Ref Range   Prothrombin Time 28.7 (H) 11.6 - 15.2 seconds   INR 2.76 (H) 0.00 - 1.49  Glucose, capillary     Status: None   Collection Time: 09/15/15  6:52 AM  Result Value Ref Range   Glucose-Capillary 89 65 - 99 mg/dL  Glucose, capillary     Status: Abnormal   Collection Time: 09/15/15 12:14 PM  Result Value Ref Range   Glucose-Capillary 105 (H) 65 - 99 mg/dL  Glucose, capillary     Status: Abnormal   Collection Time: 09/15/15  4:16 PM  Result Value Ref Range   Glucose-Capillary 123 (H) 65 - 99 mg/dL  Glucose, capillary     Status: Abnormal   Collection Time: 09/15/15  9:03 PM  Result Value Ref Range   Glucose-Capillary 106 (H) 65 - 99 mg/dL  Protime-INR     Status: Abnormal   Collection Time: 09/16/15  5:30 AM  Result Value Ref Range   Prothrombin Time 29.2 (H) 11.6 - 15.2 seconds   INR 2.81 (H) 0.00 - 1.49  Glucose, capillary     Status: None   Collection Time: 09/16/15  6:49 AM  Result Value Ref Range   Glucose-Capillary 84 65 - 99 mg/dL    Nursing note and vitals  reviewed. Constitutional: He is oriented to person, place, and time.he is slower today He appears well-developed and well-nourished. No distress.  HENT:  Head: Normocephalic and atraumatic.  Eyes: Conjunctivae and EOM are normal.   Neck: Decreased range of motion present.  Trach with dry dressing in place. #5 extra long--able to speak with PMV Cardiovascular: Normal rate and regular rhythm. noES Respiratory: Effort normal and breath sounds normal. No respiratory distress. He has no wheezes.    GI: Soft. Bowel sounds are normal.  Non-tender. Midline incision has healed . Ostomy with bilious drainage which appears decreased. Multiple drains LLQ sutured in place.no erythema  Genitourinary:  Foley in place.  Musculoskeletal: He exhibits edema b/l LE  Right knee with effusion. No tenderness.  Skin: Abdominal wound healed by secondary intention, protruding small suture  at midline Right lower shin with stasis changes. Multiple ulcers on bilateral heels Diffuse macular rash Neurological: He is alert and oriented to person, place, and time.    Speech clear. Follows basic commands without difficulty.  Psychiatric: He has a normal mood and affect. His speech is normal and behavior is normal. Cognition and memory are normal.  Strength Antigravity through, except b/l hip flexors.   Sensation diminished in feet.    Assessment/Plan: 1. 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 Physiatrist is providing close team supervision and 24 hour management of active medical problems listed below. Physiatrist and rehab team continue to assess barriers to discharge/monitor patient progress toward functional and medical goals.  Team conference today please see physician documentation under team conference tab, met with team face-to-face to discuss problems,progress, and goals. Formulized individual treatment plan based on medical history, underlying problem and comorbidities. FIM: Function - Bathing Position: Sitting EOB Body parts bathed by patient: Right arm, Left arm, Chest, Right upper leg, Left upper leg, Right lower leg, Left lower leg, Front perineal area, Abdomen Body parts bathed by helper: Buttocks, Back Bathing not applicable: Abdomen Assist Level: Assistive device Assistive Device Comment: LH sponge and reacher  Function- Upper Body Dressing/Undressing What is the patient wearing?: Pull over shirt/dress Pull over shirt/dress - Perfomed by patient: Thread/unthread right sleeve, Thread/unthread left sleeve, Put head through opening Pull over shirt/dress - Perfomed by helper: Pull shirt over trunk Assist Level: Supervision or verbal cues Function - Lower Body  Dressing/Undressing What is the patient wearing?: Non-skid slipper socks, Pants Position: Bed Underwear - Performed by helper: Thread/unthread right underwear leg, Thread/unthread left underwear leg, Pull underwear up/down Pants- Performed by patient: Thread/unthread right pants leg, Thread/unthread left pants leg Pants- Performed by helper: Thread/unthread right pants leg, Thread/unthread left pants leg, Pull pants up/down Non-skid slipper socks- Performed by patient: Don/doff right sock (doff only) Non-skid slipper socks- Performed by helper: Don/doff right sock, Don/doff left sock Socks - Performed by helper: Don/doff right sock, Don/doff left sock Assist for footwear: Dependant Assist for lower body dressing: Touching or steadying assistance (Pt > 75%)  Function - Toileting Toileting activity did not occur: Safety/medical concerns Assist level: Two helpers (per [place], NT report)  Function - Air cabin crew transfer activity did not occur: Safety/medical concerns  Function - Chair/bed transfer Chair/bed transfer activity did not occur: Safety/medical concerns Chair/bed transfer method: Lateral scoot Chair/bed transfer assist level: 2 helpers Chair/bed transfer assistive device: Sliding board Mechanical lift: Maximove Chair/bed transfer details: Manual facilitation for weight shifting, Verbal cues for  safe use of DME/AE, Verbal cues for technique, Manual facilitation for placement  Function - Locomotion: Wheelchair Will patient use wheelchair at discharge?: Yes Type: Manual Wheelchair activity did not occur: Safety/medical concerns Max wheelchair distance: 75 Assist Level: Supervision or verbal cues Wheel 50 feet with 2 turns activity did not occur: Safety/medical concerns Assist Level: Supervision or verbal cues Wheel 150 feet activity did not occur: Safety/medical concerns Turns around,maneuvers to table,bed, and toilet,negotiates 3% grade,maneuvers on rugs and over  doorsills: No Function - Locomotion: Ambulation Ambulation activity did not occur: Safety/medical concerns Walk 10 feet activity did not occur: Safety/medical concerns Walk 50 feet with 2 turns activity did not occur: Safety/medical concerns Walk 150 feet activity did not occur: Safety/medical concerns Walk 10 feet on uneven surfaces activity did not occur: Safety/medical concerns  Function - Comprehension Comprehension: Auditory Comprehension assist level: Understands complex 90% of the time/cues 10% of the time  Function - Expression Expression: Verbal Expression assistive device: Talk trach valve Expression assist level: Expresses basic 90% of the time/requires cueing < 10% of the time.  Function - Social Interaction Social Interaction assist level: Interacts appropriately 90% of the time - Needs monitoring or encouragement for participation or interaction.  Function - Problem Solving Problem solving assist level: Solves basic 75 - 89% of the time/requires cueing 10 - 24% of the time  Function - Memory Memory assist level: Recognizes or recalls 90% of the time/requires cueing < 10% of the time Patient normally able to recall (first 3 days only): Current season, Location of own room, Staff names and faces, That he or she is in a hospital  1. 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    2. LLE DVT treatment /Anticoagulation: Pharmaceutical: Coumadin---therapeutic   3. Pain Management: Tylenol for prn for pain. Will continue oxycodone prn if needed for severe pain.  -would benefit from scheduled oxy prior to am therapies---will schedule one hour ahead 4. Mood: LCSW to follow for evaluation and support.  5. Neuropsych: This patient is capable of making decisions on his own behalf. 6. Skin/Wound Care:   sacral wound debridement by surgery  - PRAFO ordered  for heels, Cont air bed , Once  Hydrotherapy is d/ced,  place wound VAC, in am  -reviewed concept of serous drainage with patient 7. Fluids/Electrolytes/Nutrition: Monitor I/O. Will consult dietician to help with food choices. Increase prosource to TID. Added ensure between meals as unable to tolerate a lot of food at one time.   - Pt eating 100% of meals.  8. Abdominal abscess: Continue drain care bid.   ID on consult, Pt refused contrast has some improvement of at least one abscess with R drain removed, switched to imipenem, IR following drain output, repeat CT abd when output ~61m/day, does have 171mflush per day each catheter Per infectious disease okay to discontinue antibiotics and monitor 9. Hypokalemia: Stable at 3.4 today. I personally reviewed the patient's labs today. Increase Kay Ciel 10. VDRF/ History of OSA: To continue current trach till mobility improves per CCM. Tolerating humidified air. We will need to reconsult pulmonary before decannulationmobility improving, ask pulm to eval, has extra long trach #5, Don't think there is a number 4 available in this length  11. Anxiety disorder: resumed low dose Xanax as used this for insomnia at home. given his respiratory compromise will need to limit dosing.  Improving.  12. Anemia: Likely due to infection/chronic illness. Hgb stable at 9.3 on 10/24.  Will cont to monitor.                      13. Anasarca: Improved Continue Lasix, protein supplements.  14. Diastolic CHF: Off amiodarone, Ace and statin at this time due to recurrent hypotension and multiple medical issues. Monitor for signs of overload. Weights stable.  15.  Hypomagnesemia-. Magnesium 84m BID, repeat Mg level on 10/24, normal at 1.7        16..Marland KitchenUrinary retention voiding trial , no I/O cath needed LOS (Days) 20 A FACE TO FACE EVALUATION WAS PERFORMED Jonathan Le 09/16/2015, 8:26 AM

## 2015-09-16 NOTE — Progress Notes (Signed)
Orthopedic Tech Progress Note Patient Details:  Ardyth HarpsHarold D Burkel 07/21/1946 [id number] Brace order completed by [employer] vendor. Patient ID: Ardyth HarpsHarold D Starzyk, male   DOB: 09/15/1946, 69 y.o.   MRN: [id number]   Jennye MoccasinHughes, Heitor Steinhoff Craig 09/16/2015, 3:02 PM

## 2015-09-16 NOTE — Progress Notes (Signed)
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  Abscess of abdominal cavity ([place])     .  Enterococcal infection     .  Sacral decubitus ulcer     .  Hypoalbuminemia due to protein-calorie malnutrition ([place])     .  Critical illness myopathy  08/29/2015   .  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  Intra-abdominal abscess ([place])     .  Debility  08/27/2015   .  Acute on chronic respiratory failure, unspecified whether with hypoxia or hypercapnia ([place])     .  Encounter for orogastric (OG) tube placement     .  Abdominal abscess ([place])     .  Abscess     .  Encounter for central line placement     .  Encounter for intubation     .  Respiratory failure ([place])     .  Septic shock ([place])     .  Blood poisoning ([place])     .  Tracheostomy status ([place])     .  Acute respiratory failure ([place])     .  Pressure ulcer  06/21/2015   .  Shock ([place])  06/18/2015   .  Abdominal pain, generalized     .  Shortness of breath     .  Hypoxia     .  S/P CABG x 3  05/22/2015   .  Cardiomyopathy, ischemic  05/21/2015   .  CAD, multiple vessel  05/21/2015   .  Acute systolic congestive heart failure, NYHA class 3 ([place])  05/21/2015   .  Morbid obesity ([place])  05/20/2015   .  OSA on CPAP  05/20/2015   .  Other  specified hypotension     .  NSTEMI (non-ST elevated myocardial infarction) ([place])  05/19/2015   .  Benign prostatic hyperplasia with urinary obstruction  04/24/2015   .  Chronic LBP  12/05/2013   .  Essential hypertension  09/04/2013     Expected Discharge Date: Expected Discharge Date: 09/24/15  Team Members Present: Physician leading conference: Dr. Alysia Penna Social Worker Present: Ovidio Kin, LCSW Nurse Present: Dorien Chihuahua, RN PT Present: Raylene Everts, PT OT Present: Clyda Greener, OT SLP Present: Windell Moulding, SLP PPS Coordinator present : Daiva Nakayama, RN, CRRN        Current Status/Progress  Goal  Weekly Team Focus   Medical     Now voiding without foley, wound vac to be placed today, possible decannulation, urinary inc  one person assist  wound vac, urinary cont   Bowel/Bladder     occassional incontinence attempts to use male urinal due to anatomy .  PVRs low, WNL ilelostomy intact draining liquid stool.   max assist   educate on emptying ileostomy and changing wafer and bag . assist with postitioning urinal .   Swallow/Nutrition/ Hydration       na         ADL's     set up for UB self care (occasional min A with shirt) sitting EOB; LB self care from bed using rolling.  Improved activity tolerance, transfer skils with slide board, general strength   overall min to mod assist  ADL retraining, functional mobility with transfers, sit to stand, pt/family education   Mobility     mod assist bed mobility, +2 for mechanical lift or inconsistent mod> +2 for slide board; sitting tolerance in w/c up to 4 hours; supervision w/c x 100'; tolerates tilt table at 70 degrees; tolerates standing in parallel bars x 30 seconds  supervision bed mobility; mod assist basic and car transfers, mod assist gait x 50' with therapists only; supervision w/c propulsion x 150' controlled, 75' home env, and min assist  x 200' community setting  bed mobility, pre-gait, transfers, tolerance of  standing, teaching awareness of body-in-space, hospital ex program with assist from sister   Communication       na         Safety/Cognition/ Behavioral Observations      no unsafe behaviors         Pain     sacral wound . oxycodone 10 mg prn prior to hydrotherapy  less than 3  assess pain management    Skin     bil. heels unstageable wounds. right > than left heel. left JP drains sites , sacral wound with hydrotherapy trach site .midline incision healing pink approximated no dressing   no new breakdown total assist   change from hydrotherapy to wound VAC.        *See Care Plan and progress notes for long and short-term goals.    Barriers to Discharge:  needs to use CPAP     Possible Resolutions to Barriers:   cont above, stand to void     Discharge Planning/Teaching Needs:   Pt making progress, would like trach removed before DC, wife not assisting pt at DC. Will probably be NHP       Team Discussion:    Improved activity tolerance and making slow gain-stood for 30 seconds in the parallel bars. DC-SP met goals. Wound Vac to be placed today-off antibiotics. Pulmonary looking at trach-needs to wear CPAP at night. R-knee pain limiting-needs knee support. Foley removed and voiding-some incontinence. Begin NHP search   Revisions to Treatment Plan:    DC-SP due to met goals/ begin NH search    Continued Need for Acute Rehabilitation Level of Care: The patient requires daily medical management by a physician with specialized training in physical medicine and rehabilitation for the following conditions: Daily direction of a multidisciplinary physical rehabilitation program to ensure safe treatment while eliciting the highest outcome that is of practical value to the patient.: Yes Daily medical management of patient stability for increased activity during participation in an intensive rehabilitation regime.: Yes Daily analysis of laboratory values and/or radiology reports with any subsequent  need for medication adjustment of medical intervention for : Neurological problems;Other;Post surgical problems;Pulmonary problems;Cardiac problems  [name] 09/16/2015, 1:42 PM                 [name], LCSW Social Worker Signed  Patient Care Conference 09/09/2015  1:58 PM    Expand All Collapse All   Inpatient RehabilitationTeam Conference and Plan of Care Update Date: 09/09/2015   Time: 10:50 AM     Patient Name: Jonathan Le       Medical Record Number: [id number]  Date of Birth: 1946/01/11 Sex: Male         Room/Bed: 4W07C/4W07C-01 Payor Info: Payor: MEDICARE / Plan: MEDICARE PART A AND B / Product Type: *No Product type* /    Admitting Diagnosis: debility trach   Admit Date/Time:  08/27/2015  3:37 PM Admission Comments: No comment available   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  Abscess of abdominal cavity ([place])     .  Enterococcal infection     .  Sacral decubitus ulcer     .  Hypoalbuminemia due to protein-calorie malnutrition ([place])     .  Critical illness myopathy  08/29/2015   .  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  Intra-abdominal abscess ([place])     .  Debility  08/27/2015   .  Acute on chronic respiratory failure, unspecified whether with hypoxia or hypercapnia ([place])     .  Encounter for orogastric (OG) tube placement     .  Abdominal abscess ([place])     .  Abscess     .  Encounter for central line placement     .  Encounter for intubation     .  Respiratory failure ([place])     .  Septic shock ([place])     .  Blood poisoning ([place])     .  Tracheostomy status ([place])     .  Acute respiratory failure ([place])     .  Pressure ulcer  06/21/2015   .  Shock ([place])  06/18/2015   .  Abdominal pain, generalized     .  Shortness of breath     .  Hypoxia     .  S/P CABG x 3  05/22/2015   .  Cardiomyopathy, ischemic  05/21/2015    .  CAD, multiple vessel  05/21/2015   .  Acute systolic congestive heart failure, NYHA class 3 ([place])  05/21/2015   .  Morbid obesity ([place])  05/20/2015   .  OSA on CPAP  05/20/2015   .  Other specified hypotension     .  NSTEMI (non-ST elevated myocardial infarction) ([place])  05/19/2015   .  Benign prostatic hyperplasia with urinary obstruction  04/24/2015   .  Chronic LBP  12/05/2013   .  Essential hypertension  09/04/2013     Expected Discharge Date: Expected Discharge Date: 09/24/15  Team Members Present: Physician leading conference: Dr. Alysia Penna Social Worker Present: Ovidio Kin, LCSW PT Present: Georjean Mode, PT OT Present: Clyda Greener, OT SLP Present: Windell Moulding, SLP PPS Coordinator present : Daiva Nakayama, RN, CRRN        Current Status/Progress  Goal  Weekly Team Focus   Medical     maximove transfers unchanged, some increased movement in LE, severe trunkal weakness  On e person assist mobility  ongoing wound care   Bowel/Bladder     Foley catheter,ileostomy with out complicacions.  To keep ileostomy working properly with mod. assisst.   Assess ostomy and foley cath. Q shift to prevent complicacions.   Swallow/Nutrition/ Hydration  na         ADL's     Min assist for UB bathing and dressing, max assist for LB sit to supine.  Pt still not able to tolerate standing for LB selfcare or transfers.  Uses AE for LB selfcare tasks as well.  BUE strength and shoulder weakness.  tolerating sitting much better than last week as well as OOB.  Still with slow progress however  overall min to mod assist  selfcare re-training, transfer training, pt/family education, balance re-training, increasing OOB tolerance.      Mobility     mod assist bed mobility; +2 for mechanical lift or MaxiSlide in supine to tilt tabel; tolerance of 70 degrees on tilt table; sitting tolerance in w/c x 1.5 hours  supervision bed mobility; mod assist basic and car transfers, mod assist gait x 50'  with therapists only; supervision w/c propulsion x 150' controlled, 75' home env, and min assist  x 200' community setting  bed mobility, pre-gait, transfers, tolerance of upright sitting EOB or in w/c, family ed   Communication     Tolerating PMSV during all waking hours, can donn/doff valve with supervision-mod I, education is complete, recommend d/c speaking valve goals          Safety/Cognition/ Behavioral Observations    Consistently improved mentation, improved recall for daily information, improved functional problem solving, improved attention to tasks and awareness of deficits, quickly approaching LTG  supervision   completion of education and potential discharge from [facility] by the end of this week    Pain     No complain of pain.  To keep pain levels less than 3. On scale 1 to 10.  To assess for pain levels Q 2-3 hrs.    Skin     Multiple wounds:unstagable to heel and sacrum with hydrotherapy. Midline incision with daily dressing change.  No further skin breakdown or infection with max. assisst.   Assess skin Q shift and PRN.Continue dressing changes as per orders.      *See Care Plan and progress notes for long and short-term goals.    Barriers to Discharge:  severe mobility deficits     Possible Resolutions to Barriers:   Will need another facility post D/C      Discharge Planning/Teaching Needs:   Pt making good progress but would need to be walking before wife would take him home. Sister here daily but not able to provide care to pt.         Team Discussion:    Making very slow progress and no FIM change. Sacral wound better-with daily hydro therapy. PT using tilt table for standing exercise. Severe neuropathy limits pt in his therapies. Needs to be encouraged to participate and stay out fo bed .   Revisions to Treatment Plan:    Probable LTACH versus NHP    Continued Need for Acute Rehabilitation Level of Care: The patient requires daily medical management by a  physician with specialized training in physical medicine and rehabilitation for the following conditions: Daily direction of a multidisciplinary physical rehabilitation program to ensure safe treatment while eliciting the highest outcome that is of practical value to the patient.: Yes Daily medical management of patient stability for increased activity during participation in an intensive rehabilitation regime.: Yes Daily analysis of laboratory values and/or radiology reports with any subsequent need for medication adjustment of medical intervention for : Neurological problems;Post surgical problems;Pulmonary problems  Aadin Gaut, Gardiner Rhyme 09/09/2015, 1:58 PM  [name], LCSW Social Worker Signed  Patient Care Conference 09/02/2015  1:11 PM    Expand All Collapse All   Inpatient RehabilitationTeam Conference and Plan of Care Update Date: 09/02/2015   Time: 10:45 AM     Patient Name: Jonathan Le       Medical Record Number: [id number]  Date of Birth: [birth year] Sex: Male         Room/Bed: 4W07C/4W07C-01 Payor Info: Payor: MEDICARE / Plan: MEDICARE PART A AND B / Product Type: *No Product type* /    Admitting Diagnosis: debility trach   Admit Date/Time:  08/27/2015  3:37 PM Admission Comments: No comment available   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  Abscess of abdominal cavity ([place])     .  Enterococcal infection     .  Sacral decubitus ulcer     .  Hypoalbuminemia due to protein-calorie malnutrition ([place])     .  Critical illness myopathy  08/29/2015   .  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  Intra-abdominal abscess ([place])     .  Debility  08/27/2015   .  Acute on chronic respiratory failure, unspecified whether with hypoxia or hypercapnia ([place])     .  Encounter for orogastric (OG) tube placement     .  Abdominal abscess  ([place])     .  Abscess     .  Encounter for central line placement     .  Encounter for intubation     .  Respiratory failure ([place])     .  Septic shock ([place])     .  Blood poisoning ([place])     .  Tracheostomy status ([place])     .  Acute respiratory failure ([place])     .  Pressure ulcer  06/21/2015   .  Shock ([place])  06/18/2015   .  Abdominal pain, generalized     .  Shortness of breath     .  Hypoxia     .  S/P CABG x 3  05/22/2015   .  Cardiomyopathy, ischemic  05/21/2015   .  CAD, multiple vessel  05/21/2015   .  Acute systolic congestive heart failure, NYHA class 3 ([place])  05/21/2015   .  Morbid obesity ([place])  05/20/2015   .  OSA on CPAP  05/20/2015   .  Other specified hypotension     .  NSTEMI (non-ST elevated myocardial infarction) ([place])  05/19/2015   .  Benign prostatic hyperplasia with urinary obstruction  04/24/2015   .  Chronic LBP  12/05/2013   .  Essential hypertension  09/04/2013     Expected Discharge Date: Expected Discharge Date: 09/24/15  Team Members Present: Physician leading conference: Dr. Alysia Penna Social Worker Present: Ovidio Kin, LCSW Nurse Present: Dorien Chihuahua, RN PT Present: Other (comment) Demetria Pore) OT Present: Clyda Greener, OT SLP Present: Windell Moulding, SLP PPS Coordinator present : Daiva Nakayama, RN, CRRN        Current Status/Progress  Goal  Weekly Team Focus   Medical     Anxiety with movement. Severe lower extremity and moderately severe upper extremity weakness due to critical illness mmyopathy and neuropathy   One-person assist  Increase activity tolerance  including upright, tilted    Bowel/Bladder     Foley- unstageable wound  to sacrum; ileostomy- good output   Total assist  Assess ostomy for potential problems    Swallow/Nutrition/ Hydration       na         ADL's     min assist for UB bathing, mod assist for UB dressing.  He needs mod assist for rolling in bed during LB selfcare with total assist for LB bathing and  dressing.  Max to total assist for supine to sit EOB.  Have not attempted OOB activity secondary to pa having too much pain in his buttocks in sitting.    overall min to mod assist  selfcare re-training, transfer training, pt/family education, balance re-training, increasing OOB tolerance   Mobility     +2 for bed mobilty, lateral slide transfer on MaxiSlide to tilt table; tolerance x 1 minute of 30 degrees upright on tilt table   supervision bed mobility; mod assist basic and car transfers, mod assist gait x 50' with therapists only; supervision w/c propulsion x 150' controlled, 75' home env, and min assist  x 200' community setting  bed mobilty, pre gait, transfers, tolerance of upright position on tilt table, pt ed, tolerance of sitting upright in w/c   Communication     tolerating [place] speaking valve during all waking hours but needs cues for recall of care and cleaning of valve, donning and doffing   mod I   continue education    Safety/Cognition/ Behavioral Observations    fluctuates, varies between supervision to max assist depending on medication effects and lethargy   supervision   continue to address basic to semi-complex problem solving     Pain     Denies pain         Skin     Multiple wounds; unstageable to heel, sacrum-hydrotherapy and santyl in use; midline incision to abdomen with hydrogel   No further skin breakdown or infection with max assist.   Assess skin q shift and prn; perform dressing changes per MD order      *See Care Plan and progress notes for long and short-term goals.    Barriers to Discharge:  Patient fears  movement complaints of pain with movement in multiple areas     Possible Resolutions to Barriers:   Voltaren gel for knee pain     Discharge Planning/Teaching Needs:   Discharge plan is dependent upon pt's progress here, wife can not do much physical care for pt. Sister is supportive but can not assist. Probable plan is NHP       Team Discussion:     Goals-min/mod level, extremely deconditioned-difficulty sitting up. Daily hydrotherapy for sacral wound. Pulmonary following needs to be stronger before discharging trach. Knee pain limits him. Activity tolerance poor working up this. Would benefit from neuro-psych seeing. Tilt-table tried pt didn't like.   Revisions to Treatment Plan:    None    Continued Need for Acute Rehabilitation Level of Care: The patient requires daily medical management by a physician with specialized training in physical medicine and rehabilitation for the following conditions: Daily direction of a multidisciplinary physical rehabilitation program to ensure safe treatment while eliciting the highest outcome that is of practical value to the patient.: Yes Daily medical management of patient stability for increased activity during participation in an intensive rehabilitation regime.: Yes Daily analysis of laboratory values and/or radiology reports with any subsequent need for medication adjustment of medical intervention for : Neurological problems;Other;Post surgical problems  [name] 09/02/2015, 1:11 PM  Patient ID: Jonathan Le, male   DOB: 03-07-1946, 69 y.o.   MRN: [id number]

## 2015-09-16 NOTE — Progress Notes (Signed)
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 wound has evolved into stage 4 after hydro since bone is palpable (Active)  Dressing Type Gauze (Comment);Moist to dry;Tape dressing;Other (Comment);ABD 09/16/2015 11:26 AM  Dressing Other (Comment) 09/16/2015 11:26 AM  Dressing Change Frequency Twice a day 09/16/2015 11:26 AM  State of Healing Early/partial granulation 09/16/2015 11:26 AM  Site / Wound Assessment Pink;Red;Yellow 09/16/2015 11:26 AM  % Wound base Red or Granulating 90% 09/16/2015 11:26 AM  % Wound base Yellow 10% 09/16/2015 11:26 AM  % Wound base Black 0% 09/16/2015 11:26 AM  % Wound base Other (Comment) 0% 09/16/2015 11:26 AM  Peri-wound Assessment Intact 09/16/2015 11:26 AM  Wound Length (cm) 8 cm 09/15/2015  2:10 PM  Wound Width (cm) 8.8 cm 09/15/2015  2:10 PM  Wound Depth (cm) 4.5 cm 09/15/2015  2:10 PM  Tunneling (cm) 6.0 cm from  12 to 2 o'clock 09/15/2015  2:10 PM  Margins Unattached edges (unapproximated) 09/16/2015 11:26 AM  Drainage Amount Moderate 09/16/2015 11:26 AM  Drainage Description Serosanguineous 09/16/2015 11:26 AM  Treatment Hydrotherapy (Pulse lavage);Off loading 09/16/2015 11:26 AM      Hydrotherapy Pulsed lavage therapy - wound location: sacrum/buttocks Pulsed Lavage with Suction (psi): 8 psi (4-8 psi) Pulsed Lavage with Suction - Normal Saline Used: 1000 mL Pulsed Lavage Tip: Tip with splash shield   Wound Assessment and Plan  Wound Therapy -  Assess/Plan/Recommendations Wound Therapy - Clinical Statement: Spoke with PA Claiborne Billings over the phone who requests pt be seen for final hydrotherapy session prior to [facility] placement. Continues to progress nicely. Wound Therapy - Functional Problem List: Pt with decr sitting tolerance due to sacral wound. Factors Delaying/Impairing Wound Healing: Immobility;Multiple medical problems;Polypharmacy Hydrotherapy Plan: Debridement;Dressing change;Patient/family education;Pulsatile lavage with suction Wound Therapy - Frequency: 6X / week Wound Therapy - Follow Up Recommendations: Home health RN Wound Plan: See above  Wound Therapy Goals- Improve the function of patient's integumentary system by progressing the wound(s) through the phases of wound healing (inflammation - proliferation - remodeling) by: Decrease Necrotic Tissue to: 5 Decrease Necrotic Tissue - Progress: Progressing toward goal Increase Granulation Tissue to: 95 Increase Granulation Tissue - Progress: Progressing toward goal  Goals will be updated until maximal potential achieved or discharge criteria met.  Discharge criteria: when goals achieved, discharge from hospital, MD decision/surgical intervention, no progress towards goals, refusal/missing three consecutive treatments without notification or medical reason.  GP     [name] 09/16/2015, 11:33 AM   Elayne Snare, [place]

## 2015-09-16 NOTE — Progress Notes (Signed)
Checked with patient regarding trial of trach capping and CPAP.  He reports his trach has been capped since 11/1 afternoon.  He was not offered CPAP last night.  I discussed this again with respiratory therapy.  He has his own machine / mask and has no problems with wearing mask at night.  Discussed with patient as well to ask for mask this evening for sleep.     Canary BrimBrandi Ailen Strauch, NP-C [facility] Pgr: [phone] or if no answer [phone] 09/16/2015, 10:28 AM

## 2015-09-16 NOTE — Progress Notes (Signed)
Physical Therapy Session Note  Patient Details  Name: Jonathan Le MRN: [id number] Date of Birth: 06/18/1946  Today's Date: 09/16/2015 PT Individual Time: 1300-1400 PT Individual Time Calculation (min): 60 min   Short Term Goals: Week 3:  PT Short Term Goal 1 (Week 3): pt will move laterally on bed in supine without use of rails, with 1 questioning cue PT Short Term Goal 2 (Week 3): pt will transfer w/c>< level mat with slideboard , with mod assist of 2 people, for stabilizaing w/c and assisting pt PT Short Term Goal 3 (Week 3): pt will propel through cone obstacle course x 25' with min assist for steering PT Short Term Goal 4 (Week 3): pt will tolerate standing in [place] lift x 2 minutes  Skilled Therapeutic Interventions/Progress Updates:   Pt sitting up in w/c.  Wound vac now in place for sacral wound.  PT wrapped R knee with 2 ACE wraps.  Xrays pending of knee. 175 ml of urine emptied from urinal; NT informed.  Sit><stand with SaraPlus x 1 minute, x 40 seconds, x 2 minutes during transition over to bed.    Pt sat EOB x 10 minutes attempting to urinate in urinal.  Pt scooted forward x 2 scoots L/R with min assist to facilitate placement of urinal.  Pt held urinal once placed there, but was unsuccessful.  [facility] from [facility] here with R knee adjustable angle. PT assisted pt into supine for Jonathan Le to fit brace.  With extra time, pt able to name upper body movements needed for him to straighten himself in bed; he was unable to identify how to resolve the problem with LE positioning.  Mod assist to move R/L LEs laterally.    Pt left with Jonathan Le and awaiting transport for Xrays; PT requested NT change pt into gown to facilitate use of urinal in bed..    Therapy Documentation Precautions:  Precautions Precautions: Fall Precaution Comments: sacral decub, trach, drain lines from abdomen Restrictions Weight Bearing Restrictions: No Other Position/Activity Restrictions: trach  collar   Pain: Pain Assessment 8/10 buttocks Pain Type: Acute pain Pain Intervention(s): Repositioned     See Function Navigator for Current Functional Status.   Therapy/Group: Individual Therapy  Blayne Frankie 09/16/2015, 3:48 PM

## 2015-09-16 NOTE — Patient Care Conference (Signed)
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 Abscess of abdominal cavity (Jonathan Le)   . Enterococcal infection   . Sacral decubitus ulcer   . Hypoalbuminemia due to protein-calorie malnutrition (Jonathan Le)   . 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 Critical illness neuropathy (Jonathan Le) 08/29/2015  . Intra-abdominal abscess (Jonathan Le)   . Debility 08/27/2015  . Acute on chronic respiratory failure, unspecified whether with hypoxia or hypercapnia ([place])   . Encounter for orogastric (OG) tube placement   . Abdominal abscess (Jonathan Le)   . Abscess   . Encounter for central line placement   . Encounter for intubation   . Respiratory failure (Jonathan Le)   . Septic shock (Jonathan Le)   . Blood poisoning (Jonathan Le)   . Tracheostomy status (Jonathan Le)   . Acute respiratory failure (Jonathan Le)   . Pressure ulcer 06/21/2015  . Shock (Jonathan Le) 06/18/2015  . Abdominal pain, generalized   . Shortness of breath   . Hypoxia   . 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 Cardiomyopathy, ischemic 05/21/2015  . CAD, multiple vessel 05/21/2015  . Acute systolic congestive heart failure, NYHA class 3 (Jonathan Le) 05/21/2015  . Morbid obesity (Jonathan Le) 05/20/2015  . OSA on CPAP 05/20/2015  . Other specified hypotension   . NSTEMI (non-ST elevated myocardial infarction) (Jonathan Le) 05/19/2015  . Benign prostatic hyperplasia with urinary obstruction 04/24/2015  . Chronic LBP 12/05/2013  . Essential hypertension 09/04/2013     Expected Discharge Date: Expected Discharge Date: 09/24/15  Team Members Present: Physician leading conference: Dr. Alysia Penna Social Worker Present: Ovidio Kin, LCSW Nurse Present: Dorien Chihuahua, RN PT Present: Raylene Everts, PT OT Present: Clyda Greener, OT SLP Present: Windell Moulding, SLP PPS Coordinator present : Daiva Nakayama, RN, CRRN     Current Status/Progress Goal Weekly Team Focus  Medical   Now  voiding without foley, wound vac to be placed today, possible decannulation, urinary inc  one person assist  wound vac, urinary cont   Bowel/Bladder   occassional incontinence attempts to use male urinal due to anatomy . PVRs low, WNL ilelostomy intact draining liquid stool.   max assist   educate on emptying ileostomy and changing wafer and bag . assist with postitioning urinal .   Swallow/Nutrition/ Hydration     na        ADL's   set up for UB self care (occasional min A with shirt) sitting EOB; LB self care from bed using rolling.  Improved activity tolerance, transfer skils with slide board, general strength  overall min to mod assist  ADL retraining, functional mobility with transfers, sit to stand, pt/family education   Mobility   mod assist bed mobility, +2 for mechanical lift or inconsistent mod> +2 for slide board; sitting tolerance in w/c up to 4 hours; supervision w/c x 100'; tolerates tilt table at 70 degrees; tolerates standing in parallel bars x 30 seconds  supervision bed mobility; mod  assist basic and car transfers, mod assist gait x 50' with therapists only; supervision w/c propulsion x 150' controlled, 75' home env, and min assist  x 200' community setting  bed mobility, pre-gait, transfers, tolerance of standing, teaching awareness of body-in-space, hospital ex program with assist from sister   Communication     na        Safety/Cognition/ Behavioral Observations    no unsafe behaviors        Pain   sacral wound . oxycodone 10 mg prn prior to  hydrotherapy  less than 3  assess pain management    Skin   bil. heels unstageable wounds. right > than left heel. left JP drains sites , sacral wound with hydrotherapy trach site .midline incision healing pink approximated no dressing   no new breakdown total assist   change from hydrotherapy to wound VAC.       *See Care Plan and progress notes for long and short-term goals.  Barriers to Discharge: needs to use CPAP    Possible Resolutions to Barriers:  cont above, stand to void    Discharge Planning/Teaching Needs:  Pt making progress, would like trach removed before DC, wife not assisting pt at DC. Will probably be NHP      Team Discussion:  Improved activity tolerance and making slow gain-stood for 30 seconds in the parallel bars. DC-SP met goals. Wound Vac to be placed today-off antibiotics. Pulmonary looking at trach-needs to wear CPAP at night. R-knee pain limiting-needs knee support. Foley removed and voiding-some incontinence. Begin NHP search  Revisions to Treatment Plan:  DC-SP due to met goals/ begin NH search   Continued Need for Acute Rehabilitation Level of Care: The patient requires daily medical management by a physician with specialized training in physical medicine and rehabilitation for the following conditions: Daily direction of a multidisciplinary physical rehabilitation program to ensure safe treatment while eliciting the highest outcome that is of practical value to the patient.: Yes Daily medical management of patient stability for increased activity during participation in an intensive rehabilitation regime.: Yes Daily analysis of laboratory values and/or radiology reports with any subsequent need for medication adjustment of medical intervention for : Neurological problems;Other;Post surgical problems;Pulmonary problems;Cardiac problems  [name] 09/16/2015, 1:42 PM

## 2015-09-16 NOTE — Progress Notes (Signed)
Set up pt's home CPAP machine at bedside. Pt states he can place himself on later this evening but will call if he needs any help. RT will continue to monitor.

## 2015-09-16 NOTE — Progress Notes (Signed)
ANTICOAGULATION CONSULT NOTE - Follow Up Consult  Pharmacy Consult for coumadin Indication: DVT  Allergies  Allergen Reactions  . Unasyn [Ampicillin-Sulbactam Sodium] Hives, Itching and Rash    Diffuse rash not responding to Pepcid and Benadryl.  TDD.  Marland Kitchen. Levaquin [Levofloxacin] Nausea Only  . Penicillins Hives  . Doxycycline Rash    Patient Measurements: Height: 5\' 9"  (175.3 cm) Weight: 236 lb 1.8 oz (107.1 kg) IBW/kg (Calculated) : 70.7 Heparin Dosing Weight:   Vital Signs: Temp: 97.5 F (36.4 C) (11/02 0445) Temp Source: Oral (11/02 0445) BP: 91/50 mmHg (11/02 0445) Pulse Rate: 90 (11/02 0445)  Labs:  Recent Labs  09/14/15 0510 09/15/15 0530 09/16/15 0530  HGB 9.4*  --   --   HCT 29.4*  --   --   PLT 303  --   --   LABPROT 28.0* 28.7* 29.2*  INR 2.67* 2.76* 2.81*  CREATININE 0.30*  --   --     Estimated Creatinine Clearance: 103.7 mL/min (by C-G formula based on Cr of 0.3).   Medications:  Scheduled:  . antiseptic oral rinse  15 mL Mouth Rinse TID PC  . collagenase   Topical BID  . diclofenac sodium  2 g Topical QID  . famotidine  20 mg Oral BID  . feeding supplement (PRO-STAT SUGAR FREE 64)  30 mL Oral TID  . furosemide  60 mg Oral Daily  . hydrocerin   Topical BID  . insulin aspart  0-5 Units Subcutaneous QHS  . insulin aspart  0-9 Units Subcutaneous TID WC  . magnesium oxide  800 mg Oral BID  . multivitamin with minerals  1 tablet Oral Daily  . oxyCODONE  2.5 mg Oral Q0600  . potassium chloride  20 mEq Oral TID  . sodium chloride  10 mL Intravenous Q12H  . tamsulosin  0.4 mg Oral QPC supper  . vitamin C  500 mg Oral BID  . Warfarin - Pharmacist Dosing Inpatient   Does not apply q1800   Infusions:    Assessment: 69 yo male with DVT is currently on therapeutic coumadin.  INR today is 2.81.  Goal of Therapy:  INR 2-3 Monitor platelets by anticoagulation protocol: Yes   Plan:  coumadin 2.5 mg po x1 (may need 5 mg most days and 2.5 mg 1-2 a  week) PT/INR in am  Jeremaih Klima, Tsz-Yin 09/16/2015,8:26 AM

## 2015-09-16 NOTE — Consult Note (Addendum)
WOC wound consult note Reason for Consult: Consult requested for application of Vac dressing to sacral wound.  Wound has been followed by CCS team and physical therapy for hydrotherapy and has become clean enough at this time for Vac application. 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  Bone palpable Wound bed: 90% red, 10% yellow  Drainage (amount, consistency, odor) Small amt tan drainage, no odor. Periwound: Small partial thickness wound above, approx .2X2X.1cm, dark red moist wound bed. Dressing procedure/placement/frequency: Applied one piece black foam and barrier ring to maintain a seal near the buttocks to 125mm cont suction after hydrotherapy was completed.  Pt tolerated with minimal discomfort.  It will be difficult to maintain Vac seal when therapy uses a sliding board to move.  Extra Vac drape left at bedside for nurses to maintain seal.  Plan to change dressing Q M/W/F at 0800.  WOC ostomy consult note Stoma type/location: Ostomy pouch in place to RLQ Stomal assessment/size: Stoma red and viable when visualized through pouch.  Output: Mod amt tan liquid stool in pouch  Ostomy pouching: 2pc.  Education provided:  Pt states he has not received any teaching regarding ostomy care since surgery.  Will perform pouch change and educational session on Fri with Vac dressing change. Supplies at bedside for staff nurse use. Enrolled [facility] in [facility] Start DC program: No  Cammie Mcgeeawn Felice Deem MSN, RN, [place], [place]CN-AP, [place] [phone]

## 2015-09-16 NOTE — Progress Notes (Signed)
Patient ID: Jonathan Le, male   DOB: 08/09/1946, 69 y.o.   MRN: [id number]    Subjective: Pt with no complaints.  Objective: Vital signs in last 24 hours: Temp:  [97.1 F (36.2 C)-97.5 F (36.4 C)] 97.5 F (36.4 C) (11/02 0445) Pulse Rate:  [90-99] 90 (11/02 0445) Resp:  [18] 18 (11/01 1439) BP: (91-110)/(50-75) 91/50 mmHg (11/02 0445) SpO2:  [93 %-100 %] 95 % (11/02 0445) FiO2 (%):  [20 %-21 %] 20 % (11/02 0445) Weight:  [107.1 kg (236 lb 1.8 oz)] 107.1 kg (236 lb 1.8 oz) (11/02 0500) Last BM Date: 09/15/15  Intake/Output from previous day: 11/01 0701 - 11/02 0700 In: 660 [P.O.:600; I.V.:30] Out: 1798 [Urine:923; Drains:25; Stool:850] Intake/Output this shift:    PE: Skin: sacral wound is almost completely clean.  The deep base has palpable bone with some thin fibrin tissue surrounding this.    Lab Results:   Recent Labs  09/14/15 0510  WBC 6.4  HGB 9.4*  HCT 29.4*  PLT 303   BMET  Recent Labs  09/14/15 0510  NA 138  K 3.4*  CL 102  CO2 29  GLUCOSE 87  BUN 9  CREATININE 0.30*  CALCIUM 8.1*   PT/INR  Recent Labs  09/15/15 0530 09/16/15 0530  LABPROT 28.7* 29.2*  INR 2.76* 2.81*   CMP     Component Value Date/Time   NA 138 09/14/2015 0510   K 3.4* 09/14/2015 0510   CL 102 09/14/2015 0510   CO2 29 09/14/2015 0510   GLUCOSE 87 09/14/2015 0510   BUN 9 09/14/2015 0510   CREATININE 0.30* 09/14/2015 0510   CALCIUM 8.1* 09/14/2015 0510   PROT 5.2* 08/28/2015 0430   ALBUMIN 1.6* 08/28/2015 0430   AST 22 08/28/2015 0430   ALT 10* 08/28/2015 0430   ALKPHOS 80 08/28/2015 0430   BILITOT 0.6 08/28/2015 0430   GFRNONAA >60 09/14/2015 0510   GFRAA >60 09/14/2015 0510   Lipase     Component Value Date/Time   LIPASE 15* 05/19/2015 2140       Studies/Results: No results found.  Anti-infectives: Anti-infectives    Start     Dose/Rate Route Frequency Ordered Stop   09/06/15 1400  imipenem-cilastatin (PRIMAXIN) 500 mg in sodium chloride 0.9  % 100 mL IVPB  Status:  Discontinued     500 mg 200 mL/hr over 30 Minutes Intravenous 3 times per day 09/06/15 1110 09/06/15 1121   09/06/15 1200  imipenem-cilastatin (PRIMAXIN) 500 mg in sodium chloride 0.9 % 100 mL IVPB  Status:  Discontinued     500 mg 200 mL/hr over 30 Minutes Intravenous 4 times per day 09/06/15 1121 09/15/15 1728   09/01/15 1800  Ampicillin-Sulbactam (UNASYN) 3 g in sodium chloride 0.9 % 100 mL IVPB  Status:  Discontinued     3 g 100 mL/hr over 60 Minutes Intravenous Every 6 hours 09/01/15 1539 09/06/15 1105   08/27/15 2200  meropenem (MERREM) 1 g in sodium chloride 0.9 % 100 mL IVPB  Status:  Discontinued     1 g 200 mL/hr over 30 Minutes Intravenous 3 times per day 08/27/15 1658 09/01/15 1436   08/27/15 1615  metroNIDAZOLE (FLAGYL) tablet 500 mg  Status:  Discontinued     500 mg Oral 3 times per day 08/27/15 1610 08/28/15 1651       Assessment/Plan   1. Stage 4 sacral decub ulcer -hydrotherapy today and then DC. -WOC consult for placement of wound VAC 2. S/p subtotal colectomy/ileostomy  with post op abscess/fluid collections -abx stopped.  Still afebrile with no WBC -drains being managed currently by IR.  Remain serous in nature and have been in for at least 4 weeks.   LOS: 20 days    Jonathan Le 09/16/2015, 8:49 AM Pager: [phone]

## 2015-09-16 NOTE — Progress Notes (Signed)
Orthopedic Tech Progress Note Patient Details:  Jonathan Le 04/02/1946 [id number]  Patient ID: Jonathan Le, male   DOB: 08/26/1946, 69 y.o.   MRN: [id number] Called in advanced brace order; spoke with Ferdinand LangoKanisha  [name] 09/16/2015, 10:59 AM

## 2015-09-17 ENCOUNTER — Inpatient Hospital Stay (HOSPITAL_COMMUNITY): Payer: Medicare Other

## 2015-09-17 ENCOUNTER — Inpatient Hospital Stay (HOSPITAL_COMMUNITY): Payer: Medicare Other | Admitting: Occupational Therapy

## 2015-09-17 DIAGNOSIS — J9610 Chronic respiratory failure, unspecified whether with hypoxia or hypercapnia: Secondary | ICD-10-CM

## 2015-09-17 HISTORY — DX: Chronic respiratory failure, unspecified whether with hypoxia or hypercapnia: J96.10

## 2015-09-17 LAB — GLUCOSE, CAPILLARY
GLUCOSE-CAPILLARY: 100 mg/dL — AB (ref 65–99)
GLUCOSE-CAPILLARY: 105 mg/dL — AB (ref 65–99)
Glucose-Capillary: 89 mg/dL (ref 65–99)
Glucose-Capillary: 128 mg/dL — ABNORMAL HIGH (ref 65–99)

## 2015-09-17 LAB — PROTIME-INR
INR: 2.74 — AB (ref 0.00–1.49)
PROTHROMBIN TIME: 28.6 s — AB (ref 11.6–15.2)

## 2015-09-17 MED ORDER — WARFARIN SODIUM 5 MG PO TABS
5.0000 mg | ORAL_TABLET | Freq: Once | ORAL | Status: AC
  Administered 2015-09-17: 5 mg via ORAL
  Filled 2015-09-17: qty 1

## 2015-09-17 NOTE — Consult Note (Signed)
   Name: Jonathan HarpsHarold D Cowdrey MRN: [id number] DOB: 09/23/1946    ADMISSION DATE:  08/27/2015 CONSULTATION DATE:  09/15/15  REFERRING MD :  Dr. Wynn BankerKirsteins / CIR   CHIEF COMPLAINT:  Tracheostomy status, consideration for decannulation   BRIEF PATIENT DESCRIPTION: 69 y/o M with recent prolonged critical illness requiring tracheostomy admitted 10/13 to rehab.  The patient has made significant progress and PCCM consulted for evaluation of de-cannulation of tracheostomy.    SIGNIFICANT EVENTS  9/6 - 9/13  [facility]  9/13  [facility]     SUBJECTIVE:   11./3/16  -nearing 48h off trach being capped. Says he feels well without problems. PT says he used his CPAP last night and tolerated it well with face mask and trach being capped. RN reports no problems  VITAL SIGNS: Temp:  [97.9 F (36.6 C)-98.4 F (36.9 C)] 97.9 F (36.6 C) (11/03 0557) Pulse Rate:  [85-105] 85 (11/03 0904) Resp:  [16-18] 18 (11/03 0904) BP: (99-100)/(44-58) 99/44 mmHg (11/03 0557) SpO2:  [95 %-98 %] 98 % (11/03 0904)  PHYSICAL EXAMINATION: General:  Obese male in NAD lying in bed, PT working on his sacral wound Neuro:  AAOx4, speech clear, MAE  HEENT:  MM pink/moist, no jvd, #6XLT proximal trach in place - CAPPED and site looks clean Cardiovascular:  s1s2 rrr, soft SEM  Lungs:  resp's even/non-labored, lungs bilaterally clear, PMV in place with strong voice Abdomen:  Obese / soft, bsx4 active, JP drains x3 on L side, ostomy with bilious drainage Musculoskeletal:  No acute deformities, generalized weakness  Skin:  Pt reported sacral ulcer (dressing over it +)   Recent Labs Lab 09/13/15 0515 09/14/15 0510  NA 134* 138  K 3.6 3.4*  CL 99* 102  CO2 29 29  BUN 7 9  CREATININE 0.35* 0.30*  GLUCOSE 87 87    Recent Labs Lab 09/14/15 0510  HGB 9.4*  HCT 29.4*  WBC 6.4  PLT 303   Dg Knee 1-2 Views Right  09/16/2015  CLINICAL DATA:  69 year old male with chronic right knee pain. No specific  injury. Initial encounter. EXAM: RIGHT KNEE - 1-2 VIEW COMPARISON:  None. FINDINGS: Moderate to severe tricompartmental joint space loss and bulky osteophytosis. Small suprapatellar joint effusion suspected. Bulky superior patellar osteophyte. Superimposed calcified atherosclerosis about the knee. No acute fracture or dislocation identified. IMPRESSION: 1. Advanced tricompartmental degenerative changes. No acute osseous abnormality identified. 2. Calcified peripheral vascular disease. Electronically Signed   By: Odessa FlemingH  Hall M.D.   On: 09/16/2015 15:27    ASSESSMENT / PLAN:  Chronic Respiratory Failure - s/p prolonged critical illness  Tracheostomy Status - in setting of resolving critical illness, #6 XLT proximal.  - currently near 48h of trach capped. Used CPAP QHs face mask without problems   Plan: Nocturnal CPAP QHS with face mask  D/C trach cap 09/17/2015   -  Ideally, would like to see him up walking before decannulation but he and we understand this is not feasilbe for some time  Pulmonary hygiene as tolerated    Dr. Kalman ShanMurali Yafet Cline, M.D., [facility].P Pulmonary and Critical Care Medicine Staff Physician [facility] Pager: [phone], If no answer or between  15:00h - 7:00h: call [phone]  09/17/2015 10:13 AM

## 2015-09-17 NOTE — Progress Notes (Signed)
Social Work Patient ID: Jonathan Le, male   DOB: 01/16/1946, 69 y.o.   MRN: [id number] Met with pt, sister and brother in-law to discuss team conference progression toward his goals and discharge 11/10. Pt pleased with his progress and getting his trach out today and his wound healing enough to get a wound vac. He still wants to stay here until he is walking and can Go home but is aware this will not happen. Have also called his wife and left a message on her voicemail on her cell phone. Pt is aware of this and appreciative, that she Is being kept in the loop. Will begin working on NHP process. 

## 2015-09-17 NOTE — Progress Notes (Signed)
Occupational Therapy Session Note  Patient Details  Name: Jonathan Le MRN: [id number] Date of Birth: 11/11/1946  Today's Date: 09/17/2015 OT Individual Time: 1330-1355 OT Individual Time Calculation (min): 25 min    Short Term Goals: Week 3:  OT Short Term Goal 1 (Week 3): Pt will donn pullover shirt EOB with close supervision. OT Short Term Goal 2 (Week 3): Pt will transition sidelying to sitting in order to complete seltcare tasks with mod assist 2 consecutive sessions OT Short Term Goal 3 (Week 3): Pt will perform sliding board transfer from bed to wheelchair with max assist. OT Short Term Goal 4 (Week 3): Pt will roll to the left with min assist and to the right with supervision during LB selfcare tasks.   Skilled Therapeutic Interventions/Progress Updates:    1:1 When arrived in room; pt on the very edge of the w/c after had mucus output from bottom and wound VAC had come unsealed.  Rn aware and present in the room when arrived. Used maxi move to assist pt back to bed to maintain safety. In bed, focused on recalling positioning for rolling and performed rolling with min A with more than reasonable amt of time while doffing pants and shirt.  Donned clean shirt in supine rolling with mod A. Pt required total A to cleanup and wound RN had to be called to address wound dressing and cleaning of wound.   Therapy Documentation Precautions:  Precautions Precautions: Fall Precaution Comments: sacral decub, trach, drain lines from abdomen Restrictions Weight Bearing Restrictions: No Other Position/Activity Restrictions: trach collar Pain: Pain Assessment Pain Assessment: Faces Faces Pain Scale: Hurts little more Pain Type: Acute pain Pain Location: Buttocks Pain Orientation: Lower Pain Intervention(s): Repositioned  See Function Navigator for Current Functional Status.   Therapy/Group: Individual Therapy  Roney MansSmith, Scotty Pinder [facility] 09/17/2015, 3:02 PM

## 2015-09-17 NOTE — Progress Notes (Signed)
When gently irrigating drain #3, noted fluid flowing from drain #3 insertion site.  Drain #2 continues to have slightly blood tinged drng..  Colostomy leaked, peri stoma raw, weeping bldy drainage, scant amts; tender.  PAC Love aware, saw.  Alvis Lemmingsawn, [facility]CR[facility] aware. Will change, assess and order appropriate interventions for care in AM as bag was already placed with tight seal at this time. Pt. Had extremely large amt. Of rerctal discharge with boosting in w/c this afternoon.  Yelowish, mucoid draunage.  Did soak wound, vac.  Changed by WOC RN.  Per report from staff, this has been an ongoing issue.  Carlean PurlMAryann Kathelene Rumberger, RN

## 2015-09-17 NOTE — Progress Notes (Signed)
ANTICOAGULATION CONSULT NOTE - Follow Up Consult  Pharmacy Consult for coumadin Indication: DVT  Allergies  Allergen Reactions  . Unasyn [Ampicillin-Sulbactam Sodium] Hives, Itching and Rash    Diffuse rash not responding to Pepcid and Benadryl.  TDD.  Marland Kitchen. Levaquin [Levofloxacin] Nausea Only  . Penicillins Hives  . Doxycycline Rash    Patient Measurements: Height: 5\' 9"  (175.3 cm) Weight: 236 lb 1.8 oz (107.1 kg) IBW/kg (Calculated) : 70.7 Heparin Dosing Weight:   Vital Signs: Temp: 97.9 F (36.6 C) (11/03 0557) Temp Source: Oral (11/03 0557) BP: 99/44 mmHg (11/03 0557) Pulse Rate: 91 (11/03 0557)  Labs:  Recent Labs  09/15/15 0530 09/16/15 0530 09/17/15 0510  LABPROT 28.7* 29.2* 28.6*  INR 2.76* 2.81* 2.74*    Estimated Creatinine Clearance: 103.7 mL/min (by C-G formula based on Cr of 0.3).   Medications:  Scheduled:  . antiseptic oral rinse  15 mL Mouth Rinse TID PC  . diclofenac sodium  2 g Topical QID  . famotidine  20 mg Oral BID  . feeding supplement (PRO-STAT SUGAR FREE 64)  30 mL Oral TID  . finasteride  5 mg Oral Daily  . furosemide  20 mg Oral Daily  . furosemide  40 mg Oral Daily  . hydrocerin   Topical BID  . insulin aspart  0-5 Units Subcutaneous QHS  . insulin aspart  0-9 Units Subcutaneous TID WC  . magnesium oxide  800 mg Oral BID  . multivitamin with minerals  1 tablet Oral Daily  . oxyCODONE  2.5 mg Oral Q0600  . potassium chloride  40 mEq Oral BID  . sodium chloride  10 mL Intravenous Q12H  . tamsulosin  0.4 mg Oral QPC supper  . vitamin C  500 mg Oral BID  . Warfarin - Pharmacist Dosing Inpatient   Does not apply q1800   Infusions:    Assessment: 69 yo male with DVT is currently on therapeutic coumadin.  INR today is 2.74.  Goal of Therapy:  INR 2-3 Monitor platelets by anticoagulation protocol: Yes   Plan:  coumadin 5 mg po x1 (may need 5 mg most days and 2.5 mg 1-2 a week) PT/INR in am Libbie Bartley, Tsz-Yin 09/17/2015,8:28 AM

## 2015-09-17 NOTE — Progress Notes (Signed)
Subjective/Complaints: Asking about knee films, happy about new brace                                                                                                                                                                                                            ROS:   Denies CP, SOB, n/v/d. Strength improving. No insomnia, anxiety, appetite ok, pain controlled  Objective: Vital Signs: Blood pressure 99/44, pulse 91, temperature 97.9 F (36.6 C), temperature source Oral, resp. rate 18, height  (1.753 m), weight 107.1 kg (236 lb 1.8 oz), SpO2 95 %. Dg Knee 1-2 Views Right  09/16/2015  CLINICAL DATA:  69 year old male with chronic right knee pain. No specific injury. Initial encounter. EXAM: RIGHT KNEE - 1-2 VIEW COMPARISON:  None. FINDINGS: Moderate to severe tricompartmental joint space loss and bulky osteophytosis. Small suprapatellar joint effusion suspected. Bulky superior patellar osteophyte. Superimposed calcified atherosclerosis about the knee. No acute fracture or dislocation identified. IMPRESSION: 1. Advanced tricompartmental degenerative changes. No acute osseous abnormality identified. 2. Calcified peripheral vascular disease. Electronically Signed   By: Odessa Fleming M.D.   On: 09/16/2015 15:27   Results for orders placed or performed during the hospital encounter of 08/27/15 (from the past 72 hour(s))  Glucose, capillary     Status: Abnormal   Collection Time: 09/14/15 11:16 AM  Result Value Ref Range   Glucose-Capillary 115 (H) 65 - 99 mg/dL  Glucose, capillary     Status: Abnormal   Collection Time: 09/14/15  4:17 PM  Result Value Ref Range   Glucose-Capillary 111 (H) 65 - 99 mg/dL  Glucose, capillary     Status: None   Collection Time: 09/14/15  9:55 PM  Result Value Ref Range   Glucose-Capillary 94 65 - 99 mg/dL  Protime-INR     Status: Abnormal   Collection Time: 09/15/15  5:30 AM  Result Value Ref Range   Prothrombin Time 28.7 (H) 11.6 - 15.2 seconds   INR  2.76 (H) 0.00 - 1.49  Glucose, capillary     Status: None   Collection Time: 09/15/15  6:52 AM  Result Value Ref Range   Glucose-Capillary 89 65 - 99 mg/dL  Glucose, capillary     Status: Abnormal   Collection Time: 09/15/15 12:14 PM  Result Value Ref Range   Glucose-Capillary 105 (H) 65 - 99 mg/dL  Glucose, capillary     Status: Abnormal   Collection Time: 09/15/15  4:16 PM  Result Value Ref Range   Glucose-Capillary 123 (H) 65 - 99 mg/dL  Glucose,  capillary     Status: Abnormal   Collection Time: 09/15/15  9:03 PM  Result Value Ref Range   Glucose-Capillary 106 (H) 65 - 99 mg/dL  Protime-INR     Status: Abnormal   Collection Time: 09/16/15  5:30 AM  Result Value Ref Range   Prothrombin Time 29.2 (H) 11.6 - 15.2 seconds   INR 2.81 (H) 0.00 - 1.49  Glucose, capillary     Status: None   Collection Time: 09/16/15  6:49 AM  Result Value Ref Range   Glucose-Capillary 84 65 - 99 mg/dL  Glucose, capillary     Status: Abnormal   Collection Time: 09/16/15 12:09 PM  Result Value Ref Range   Glucose-Capillary 116 (H) 65 - 99 mg/dL  Glucose, capillary     Status: None   Collection Time: 09/16/15  4:47 PM  Result Value Ref Range   Glucose-Capillary 96 65 - 99 mg/dL  Glucose, capillary     Status: Abnormal   Collection Time: 09/16/15  8:46 PM  Result Value Ref Range   Glucose-Capillary 102 (H) 65 - 99 mg/dL  Protime-INR     Status: Abnormal   Collection Time: 09/17/15  5:10 AM  Result Value Ref Range   Prothrombin Time 28.6 (H) 11.6 - 15.2 seconds   INR 2.74 (H) 0.00 - 1.49  Glucose, capillary     Status: None   Collection Time: 09/17/15  6:40 AM  Result Value Ref Range   Glucose-Capillary 89 65 - 99 mg/dL    Nursing note and vitals reviewed. Constitutional: He is oriented to person, place, and time.he is slower today He appears well-developed and well-nourished. No distress.  HENT:  Head: Normocephalic and atraumatic.  Eyes: Conjunctivae and EOM are normal.   Neck:  Decreased range of motion present.  Trach with dry dressing in place. #5 extra long--able to speak with PMV Cardiovascular: Normal rate and regular rhythm. noES Respiratory: Effort normal and breath sounds normal. No respiratory distress. He has no wheezes.    GI: Soft. Bowel sounds are normal.  Non-tender. Midline incision has healed . Ostomy with bilious drainage which appears decreased. Multiple drains LLQ sutured in place.no erythema  Genitourinary:  Foley in place.  Musculoskeletal: He exhibits edema b/l LE  Right knee with effusion. No tenderness.  Skin: Abdominal wound healed by secondary intention, protruding small suture at midline Right lower shin with stasis changes. Multiple ulcers on bilateral heels Diffuse macular rash Neurological: He is alert and oriented to person, place, and time.    Speech clear. Follows basic commands without difficulty.  Psychiatric: He has a normal mood and affect. His speech is normal and behavior is normal. Cognition and memory are normal.  Strength Antigravity through, except b/l hip flexors.   Sensation diminished in feet.    Assessment/Plan: 1. 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 Physiatrist is providing close team supervision and 24 hour management of active medical problems listed below. Physiatrist and rehab team continue to assess barriers to discharge/monitor patient progress toward functional and medical goals.   FIM: Function - Bathing Position: Sitting EOB Body parts bathed by patient: Right arm, Left arm, Chest, Front perineal area, Abdomen, Left upper leg, Right upper leg Body parts bathed by helper: Right lower leg, Left lower leg, Back, Buttocks Bathing not applicable: Abdomen Assist Level: Assistive device Assistive Device Comment:  LH sponge  and reacher  Function- Upper Body Dressing/Undressing What is the patient wearing?: Pull over shirt/dress Pull over shirt/dress - Perfomed by patient: Thread/unthread right sleeve, Thread/unthread left sleeve, Put head through opening Pull over shirt/dress - Perfomed by helper: Pull shirt over trunk Assist Level: Supervision or verbal cues Function - Lower Body Dressing/Undressing What is the patient wearing?: Non-skid slipper socks Position: Bed Underwear - Performed by helper: Thread/unthread right underwear leg, Thread/unthread left underwear leg, Pull underwear up/down Pants- Performed by patient: Thread/unthread right pants leg, Thread/unthread left pants leg Pants- Performed by helper: Thread/unthread right pants leg, Thread/unthread left pants leg, Pull pants up/down Non-skid slipper socks- Performed by patient: Don/doff right sock (doff only) Non-skid slipper socks- Performed by helper: Don/doff left sock, Don/doff right sock Socks - Performed by helper: Don/doff right sock, Don/doff left sock Assist for footwear: Dependant Assist for lower body dressing: Touching or steadying assistance (Pt > 75%)  Function - Toileting Toileting activity did not occur: Safety/medical concerns Assist level: Two helpers (per [place], NT report)  Function - ArchivistToilet Transfers Toilet transfer activity did not occur: Safety/medical concerns  Function - Chair/bed transfer Chair/bed transfer activity did not occur: Safety/medical concerns Chair/bed transfer method: Lateral scoot Chair/bed transfer assist level: 2 helpers Chair/bed transfer assistive device: Mechanical lift Mechanical lift: [place] Chair/bed transfer details: Manual facilitation for weight shifting, Verbal cues for safe use of DME/AE, Verbal cues for technique, Manual facilitation for placement  Function - Locomotion: Wheelchair Will patient use wheelchair at discharge?: Yes Type: Manual Wheelchair activity did not occur:  Safety/medical concerns Max wheelchair distance: 75 Assist Level: Supervision or verbal cues Wheel 50 feet with 2 turns activity did not occur: Safety/medical concerns Assist Level: Supervision or verbal cues Wheel 150 feet activity did not occur: Safety/medical concerns Turns around,maneuvers to table,bed, and toilet,negotiates 3% grade,maneuvers on rugs and over doorsills: No Function - Locomotion: Ambulation Ambulation activity did not occur: Safety/medical concerns Walk 10 feet activity did not occur: Safety/medical concerns Walk 50 feet with 2 turns activity did not occur: Safety/medical concerns Walk 150 feet activity did not occur: Safety/medical concerns Walk 10 feet on uneven surfaces activity did not occur: Safety/medical concerns  Function - Comprehension Comprehension: Auditory Comprehension assist level: Understands complex 90% of the time/cues 10% of the time  Function - Expression Expression: Verbal Expression assistive device: Talk trach valve Expression assist level: Expresses complex 90% of the time/cues < 10% of the time  Function - Social Interaction Social Interaction assist level: Interacts appropriately 90% of the time - Needs monitoring or encouragement for participation or interaction.  Function - Problem Solving Problem solving assist level: Solves basic 75 - 89% of the time/requires cueing 10 - 24% of the time  Function - Memory Memory assist level: Recognizes or recalls 90% of the time/requires cueing < 10% of the time Patient normally able to recall (first 3 days only): Current season, Location of own room, Staff names and faces, That he or she is in a hospital  1. 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    2. LLE DVT treatment /Anticoagulation: Pharmaceutical: Coumadin---therapeutic   3. Pain Management: Tylenol for prn for  pain. Will continue oxycodone prn if needed for severe pain.Knee OA mod severe, bracing and voltaren gel  -would benefit from scheduled oxy prior to am therapies---will schedule one hour ahead 4. Mood: LCSW to follow for evaluation and support.  5. Neuropsych: This patient  is capable of making decisions on his own behalf. 6. Skin/Wound Care:   sacral wound vac placed  - PRAFO ordered for heels, Cont air bed   -reviewed concept of serous drainage with patient 7. Fluids/Electrolytes/Nutrition: Monitor I/O. Will consult dietician to help with food choices. Increase prosource to TID. Added ensure between meals as unable to tolerate a lot of food at one time.   - Pt eating 100% of meals.  8. Abdominal abscess: Continue drain care bid.   ID on consult, Pt refused contrast has some improvement of at least one abscess with R drain removed, Per infectious disease okay to discontinue antibiotics and monitor 9. Hypokalemia: Stable at 3.4 today. I personally reviewed the patient's labs today. Increase Kay Ciel 10. VDRF/ History of OSA: To continue current trach till mobility improves per CCM. Tolerating humidified air. We will need to reconsult pulmonary before decannulationmobility improving, ask pulm to eval, has extra long trach #5,                       11. Anxiety disorder: resumed low dose Xanax as used this for insomnia at home. given his respiratory compromise will need to limit dosing.  Improving.  12. Anemia: Likely due to infection/chronic illness. Hgb stable at 9.3 on 10/24.  Will cont to monitor.                      13. Anasarca: Improved Continue Lasix, protein supplements.  14. Diastolic CHF: Off amiodarone, Ace and statin at this time due to recurrent hypotension and multiple medical issues. Monitor for signs of overload. Weights stable. Discussed meds with pt 15.  Hypomagnesemia-. Magnesium 800mg  BID, repeat Mg level on 10/24, normal at 1.7        16.Marland Kitchen Urinary retention voiding  trial , no I/O cath needed LOS (Days) 21 A FACE TO FACE EVALUATION WAS PERFORMED Yisel Megill E 09/17/2015, 8:37 AM

## 2015-09-17 NOTE — Progress Notes (Signed)
Physical Therapy Session Note  Patient Details  Name: Jonathan Le MRN: [id number] Date of Birth: 11/10/1946  Today's Date: 09/17/2015 PT Individual Time: 0900-1000; 1430-1530 PT Individual Time Calculation (min): 60 min , 60 min Short Term Goals: Week 3:  PT Short Term Goal 1 (Week 3): pt will move laterally on bed in supine without use of rails, with 1 questioning cue PT Short Term Goal 2 (Week 3): pt will transfer w/c>< level mat with slideboard , with mod assist of 2 people, for stabilizaing w/c and assisting pt PT Short Term Goal 3 (Week 3): pt will propel through cone obstacle course x 25' with min assist for steering PT Short Term Goal 4 (Week 3): pt will tolerate standing in [place] lift x 2 minutes  Skilled Therapeutic Interventions/Progress Updates:   tx 1:  PT donned pt's new personal orthopedic R knee braced locked at 30 degrees knee flexion to decrease R knee OA pain.    Neuro re-ed for bed mobility for rolling for donning shorts, lateral movements of bil LEs to edge of bed, mod assist to elevate trunk.   Pt needed multiple cues for upper and lower trunk and LE movements to accomplish this.   Pt sat EOB x 20 min as PT and RT started putting Sara Plus sling on pt, then noticed a puddle on floor. Liquid is incontinent serous rectal fluid.   PT assisted pt to supine and pt performed bed mobility with assistance for hygiene. PT called RN to inform her of leakage.   tx 2:  Pt had trach removed mid day today.  Voice strong and pt comfortable; O2 sats 98%.    Therapeutic exercise performed with LE to increase strength for functional mobility.Wound RN questioning whether wound vac will stay in place given pt's poor mobility in bed and use of slide board.  PT will consult team about avoiding slide board for now.      Therapeutic exercise performed with LE to increase strength for functional mobility: via hand out- 10 x 1 R/L heel slides, short arc quad knee ect, hip abduction, ankle  circles, glut/quad sets and modified abdominal crunches.  Pt used hand - out with mod cues for performance of exs.  Pt requested use of urinal in bed; pt raised HOB, and PT assisted pt with male urinal and towel to limit spillage. Pt voided 140 ml; NT informed. Therapy Documentation Precautions:  Precautions Precautions: Fall Precaution Comments: sacral decub, trach, drain lines from abdomen Restrictions Weight Bearing Restrictions: No Other Position/Activity Restrictions: trach collar   Pain: Pain Assessment 6/10 Pain Type: Acute pain Pain Location: Buttocks Pain Orientation: Lower Pain Intervention(s): Repositioned       See Function Navigator for Current Functional Status.   Therapy/Group: Individual Therapy  Faria Casella 09/17/2015, 4:05 PM

## 2015-09-17 NOTE — Progress Notes (Signed)
Pt stated he was able to place self on and off cpap, and informed RT if her needed any help during the night he would have RN call RT

## 2015-09-17 NOTE — Consult Note (Addendum)
WOC wound consult note Bedside nurse called to report pt had mod amt thick mucous drainage from rectum when OOB.  This has impaired the seal to the Vac.  Callled [facility] PA to notify them of drainage from rectum and they are aware of this occurrence and state pt has had this problem periodically during the past several weeks.  If it continues, Vac will not be an option since dressing is located very close to the rectum and will not be able to maintain a seal, or suction will pull drainage underneath the dressing and soil the wound.  They agree that if this continues, then bedside nurse can remove Vac and apply moist gauze packing. Discussed plan of care with patient and he verbalized understanding.    Drainage (amount, consistency, odor) Mod amt amt tan drainage in cannister, no odor. Wound appearance unchanged from yesterday. Dressing procedure/placement/frequency: Applied one piece black foam and barrier ring to maintain a seal near the buttocks to 125mm cont suction after hydrotherapy was completed. Pt tolerated with minimal discomfort. It will be difficult to maintain Vac seal when therapy uses a sliding board to move. Extra Vac drape left at bedside for nurses to maintain seal.  Cammie Mcgeeawn Ardean Simonich MSN, RN, CWOCN, [place]AP, CNS [phone]

## 2015-09-17 NOTE — Progress Notes (Signed)
Occupational Therapy Session Note  Patient Details  Name: Jonathan Le MRN: [id number] Date of Birth: 01/30/1946  Today's Date: 09/17/2015 OT Individual Time: 1102-1200 OT Individual Time Calculation (min): 58 min    Short Term Goals: Week 3:  OT Short Term Goal 1 (Week 3): Pt will donn pullover shirt EOB with close supervision. OT Short Term Goal 2 (Week 3): Pt will transition sidelying to sitting in order to complete seltcare tasks with mod assist 2 consecutive sessions OT Short Term Goal 3 (Week 3): Pt will perform sliding board transfer from bed to wheelchair with max assist. OT Short Term Goal 4 (Week 3): Pt will roll to the left with min assist and to the right with supervision during LB selfcare tasks.   Skilled Therapeutic Interventions/Progress Updates:   Pt completed bathing supine HOB up and down as well as at the EOB.  Pt rolled in supine for therapist to assist with washing peri area.  Min assist needed for rolling to the left and supervision for the right.  Pt still attempting to roll before bending up his LEs however.  Sat EOB for UB bathing and for donning pants over his feet.  Utilized reacher for threading pants over feet with mod assist.  Lateral leans side to side with total assist for pulling sweat pants over her hips. Total assist +2 (pt 20%) for sliding board transfer to the wheelchair from EOB.     Therapy Documentation Precautions:  Precautions Precautions: Fall Precaution Comments: sacral decub, trach, drain lines from abdomen Restrictions Weight Bearing Restrictions: No Other Position/Activity Restrictions: trach collar  Pain: Pain Assessment Pain Assessment: Faces Faces Pain Scale: Hurts little more Pain Type: Acute pain Pain Location: Buttocks Pain Orientation: Lower Pain Intervention(s): Repositioned ADL: See Function Navigator for Current Functional Status.   Therapy/Group: Individual Therapy  Dolorez Jeffrey OTR/L 09/17/2015, 12:11 PM

## 2015-09-17 NOTE — Progress Notes (Signed)
Pt stoma covered, and tolerating well at this time.

## 2015-09-17 NOTE — Progress Notes (Signed)
Order received for tracheal decannulation.  6 XL cuffless shiley removed, and occlusive dressing placed over site.  No complications noted.  Patient tolerated procedure well.

## 2015-09-17 NOTE — Progress Notes (Signed)
CPAP placed by patient. Trach capped with oxygen saturations mid to upper 90s. Will continue to monitor.

## 2015-09-18 ENCOUNTER — Inpatient Hospital Stay (HOSPITAL_COMMUNITY): Payer: Medicare Other | Admitting: Physical Therapy

## 2015-09-18 ENCOUNTER — Inpatient Hospital Stay (HOSPITAL_COMMUNITY): Payer: Medicare Other

## 2015-09-18 ENCOUNTER — Encounter (HOSPITAL_COMMUNITY): Payer: Self-pay

## 2015-09-18 ENCOUNTER — Inpatient Hospital Stay (HOSPITAL_COMMUNITY): Payer: Medicare Other | Admitting: Occupational Therapy

## 2015-09-18 DIAGNOSIS — Z430 Encounter for attention to tracheostomy: Secondary | ICD-10-CM

## 2015-09-18 LAB — GLUCOSE, CAPILLARY
GLUCOSE-CAPILLARY: 93 mg/dL (ref 65–99)
GLUCOSE-CAPILLARY: 88 mg/dL (ref 65–99)
Glucose-Capillary: 88 mg/dL (ref 65–99)
Glucose-Capillary: 99 mg/dL (ref 65–99)
Glucose-Capillary: 116 mg/dL — ABNORMAL HIGH (ref 65–99)

## 2015-09-18 LAB — PROTIME-INR
INR: 2.36 — ABNORMAL HIGH (ref 0.00–1.49)
PROTHROMBIN TIME: 25.6 s — AB (ref 11.6–15.2)

## 2015-09-18 MED ORDER — WARFARIN SODIUM 5 MG PO TABS
5.0000 mg | ORAL_TABLET | Freq: Every day | ORAL | Status: DC
  Administered 2015-09-18 – 2015-09-24 (×7): 5 mg via ORAL
  Filled 2015-09-18 (×7): qty 1

## 2015-09-18 MED ORDER — ACETAMINOPHEN 325 MG PO TABS
650.0000 mg | ORAL_TABLET | Freq: Two times a day (BID) | ORAL | Status: DC
  Administered 2015-09-18 – 2015-09-25 (×14): 650 mg via ORAL
  Filled 2015-09-18 (×15): qty 2

## 2015-09-18 MED ORDER — ACETAMINOPHEN 325 MG PO TABS: 650.0000 mg | ORAL_TABLET | Freq: Four times a day (QID) | ORAL | Status: DC | PRN

## 2015-09-18 MED ORDER — MUSCLE RUB 10-15 % EX CREA
TOPICAL_CREAM | Freq: Three times a day (TID) | CUTANEOUS | Status: DC
  Administered 2015-09-18 – 2015-09-25 (×16): via TOPICAL
  Filled 2015-09-18: qty 85

## 2015-09-18 MED ORDER — IOHEXOL 300 MG/ML  SOLN: 100.0000 mL | Freq: Once | INTRAMUSCULAR | Status: DC | PRN

## 2015-09-18 NOTE — Progress Notes (Signed)
Occupational Therapy Session Note  Patient Details  Name: FOXX KLARICH MRN: [id number] Date of Birth: 10-16-1946  Today's Date: 09/18/2015 OT Individual Time:  - 1300-1400  (60 min)      Short Term Goals: Week 1:  OT Short Term Goal 1 (Week 1): Pt will complete bathing with mod assist seated at EOB with assist of 1 caregiver OT Short Term Goal 1 - Progress (Week 1): Not met OT Short Term Goal 2 (Week 1): Pt will complete UB dressing with mod assist and min cues for problem solving/organization OT Short Term Goal 2 - Progress (Week 1): Met OT Short Term Goal 3 (Week 1): Pt will complete LB dressing at bed level with max assist OT Short Term Goal 3 - Progress (Week 1): Not met OT Short Term Goal 4 (Week 1): Pt will complete sit > stand from EOB with +2 assist to complete 1 grooming task OT Short Term Goal 4 - Progress (Week 1): Not met Week 2:  OT Short Term Goal 1 (Week 2): Pt will complete LB dressing at bed level with max assist OT Short Term Goal 1 - Progress (Week 2): Met OT Short Term Goal 2 (Week 2): Pt will donn pullover shirt EOB with close supervision. OT Short Term Goal 2 - Progress (Week 2): Not met OT Short Term Goal 3 (Week 2): Pt will perform UB bathing sitting unsupported EOB with supervision, including reaching both upper shoulders.  OT Short Term Goal 3 - Progress (Week 2): Met OT Short Term Goal 4 (Week 2): Pt will transition sidelying to sitting in order to complete seltcare tasks with mod assist 2 consecutive sessions OT Short Term Goal 4 - Progress (Week 2): Not met OT Short Term Goal 5 (Week 2): Pt will perform sliding board transfer from bed to wheelchair with max assist. OT Short Term Goal 5 - Progress (Week 2): Not met Week 3:  OT Short Term Goal 1 (Week 3): Pt will donn pullover shirt EOB with close supervision. OT Short Term Goal 2 (Week 3): Pt will transition sidelying to sitting in order to complete seltcare tasks with mod assist 2 consecutive sessions OT  Short Term Goal 3 (Week 3): Pt will perform sliding board transfer from bed to wheelchair with max assist. OT Short Term Goal 4 (Week 3): Pt will roll to the left with min assist and to the right with supervision during LB selfcare tasks.   Skilled Therapeutic Interventions/Progress Updates:    Engaged in bed mobility;  sit to stand with stedy with x3 beore pt able to get on seated part.  Pt needed rest breaks between each one.  Practiced rolling for peri care with min assist.   Remained in bed with call bell,phone within reach.   Therapy Documentation Precautions:  Precautions Precautions: Fall Precaution Comments: sacral decub, trach, drain lines from abdomen Restrictions Weight Bearing Restrictions: No Other Position/Activity Restrictions: trach collar General:     Pain: Pain Assessment Pain Assessment: 0-10          See Function Navigator for Current Functional Status.   Therapy/Group: Individual Therapy  Lisa Roca 09/18/2015, 6:05 PM

## 2015-09-18 NOTE — Progress Notes (Signed)
Subjective/Complaints: Decannulated yesterday. Denies any shortness of breath. Slept well last night with CPAP. Does not note any trach site leakage                                                                                                                                            Discussed wound VAC issues with wound nurse. Patient has rectal stump leakage and wound VAC dressing overlaps with rectum                                                                ROS:   Denies CP, SOB, n/v/d. Strength improving. No insomnia, anxiety, appetite ok, pain controlled  Objective: Vital Signs: Blood pressure 103/59, pulse 87, temperature 97.8 F (36.6 C), temperature source Oral, resp. rate 18, height  (1.753 m), weight 107.1 kg (236 lb 1.8 oz), SpO2 96 %. Dg Knee 1-2 Views Right  09/16/2015  CLINICAL DATA:  69 year old male with chronic right knee pain. No specific injury. Initial encounter. EXAM: RIGHT KNEE - 1-2 VIEW COMPARISON:  None. FINDINGS: Moderate to severe tricompartmental joint space loss and bulky osteophytosis. Small suprapatellar joint effusion suspected. Bulky superior patellar osteophyte. Superimposed calcified atherosclerosis about the knee. No acute fracture or dislocation identified. IMPRESSION: 1. Advanced tricompartmental degenerative changes. No acute osseous abnormality identified. 2. Calcified peripheral vascular disease. Electronically Signed   By: Odessa Fleming M.D.   On: 09/16/2015 15:27   Results for orders placed or performed during the hospital encounter of 08/27/15 (from the past 72 hour(s))  Glucose, capillary     Status: Abnormal   Collection Time: 09/15/15 12:14 PM  Result Value Ref Range   Glucose-Capillary 105 (H) 65 - 99 mg/dL  Glucose, capillary     Status: Abnormal   Collection Time: 09/15/15  4:16 PM  Result Value Ref Range   Glucose-Capillary 123 (H) 65 - 99 mg/dL  Glucose, capillary     Status: Abnormal   Collection Time: 09/15/15  9:03 PM  Result Value  Ref Range   Glucose-Capillary 106 (H) 65 - 99 mg/dL  Protime-INR     Status: Abnormal   Collection Time: 09/16/15  5:30 AM  Result Value Ref Range   Prothrombin Time 29.2 (H) 11.6 - 15.2 seconds   INR 2.81 (H) 0.00 - 1.49  Glucose, capillary     Status: None   Collection Time: 09/16/15  6:49 AM  Result Value Ref Range   Glucose-Capillary 84 65 - 99 mg/dL  Glucose, capillary     Status: Abnormal   Collection Time: 09/16/15 12:09 PM  Result Value Ref Range   Glucose-Capillary 116 (H) 65 - 99 mg/dL  Glucose,  capillary     Status: None   Collection Time: 09/16/15  4:47 PM  Result Value Ref Range   Glucose-Capillary 96 65 - 99 mg/dL  Glucose, capillary     Status: Abnormal   Collection Time: 09/16/15  8:46 PM  Result Value Ref Range   Glucose-Capillary 102 (H) 65 - 99 mg/dL  Protime-INR     Status: Abnormal   Collection Time: 09/17/15  5:10 AM  Result Value Ref Range   Prothrombin Time 28.6 (H) 11.6 - 15.2 seconds   INR 2.74 (H) 0.00 - 1.49  Glucose, capillary     Status: None   Collection Time: 09/17/15  6:40 AM  Result Value Ref Range   Glucose-Capillary 89 65 - 99 mg/dL  Glucose, capillary     Status: Abnormal   Collection Time: 09/17/15 12:05 PM  Result Value Ref Range   Glucose-Capillary 128 (H) 65 - 99 mg/dL  Glucose, capillary     Status: Abnormal   Collection Time: 09/17/15  4:47 PM  Result Value Ref Range   Glucose-Capillary 100 (H) 65 - 99 mg/dL  Glucose, capillary     Status: Abnormal   Collection Time: 09/17/15  9:07 PM  Result Value Ref Range   Glucose-Capillary 105 (H) 65 - 99 mg/dL   Comment 1 Notify RN   Protime-INR     Status: Abnormal   Collection Time: 09/18/15  4:37 AM  Result Value Ref Range   Prothrombin Time 25.6 (H) 11.6 - 15.2 seconds   INR 2.36 (H) 0.00 - 1.49  Glucose, capillary     Status: None   Collection Time: 09/18/15  4:37 AM  Result Value Ref Range   Glucose-Capillary 88 65 - 99 mg/dL   Comment 1 Notify RN   Glucose, capillary      Status: None   Collection Time: 09/18/15  6:58 AM  Result Value Ref Range   Glucose-Capillary 93 65 - 99 mg/dL   Comment 1 Notify RN     Nursing note and vitals reviewed. Constitutional: He is oriented to person, place, and time.he is slower today He appears well-developed and well-nourished. No distress.  HENT:  Head: Normocephalic and atraumatic.  Eyes: Conjunctivae and EOM are normal.   Neck: Decreased range of motion present.  Coughs without evidence of air leakage through healing trach site Cardiovascular: Normal rate and regular rhythm. noES Respiratory: Effort normal and breath sounds normal. No respiratory distress. He has no wheezes.    GI: Soft. Bowel sounds are normal.  Non-tender. Midline incision has healed . Ostomy with bilious drainage which appears decreased. Multiple drains LLQ sutured in place.no erythema  Genitourinary:  Foley in place.  Musculoskeletal: He exhibits edema b/l LE  Right knee with effusion. No tenderness.  Skin:  Right lower shin with stasis changes.  Diffuse macular rash Neurological: He is alert and oriented to person, place, and time.    Speech clear. Follows basic commands without difficulty.  Psychiatric: He has a normal mood and affect. His speech is normal and behavior is normal. Cognition and memory are normal.  Strength Antigravity through, except b/l hip flexors.   Sensation diminished in feet.    Assessment/Plan: 1. 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 Physiatrist is providing close team supervision and 24 hour management of active medical problems listed below. Physiatrist and rehab team continue to assess barriers  to discharge/monitor patient progress toward functional and medical goals.   FIM: Function - Bathing Position: Sitting  EOB Body parts bathed by patient: Right arm, Left arm, Chest, Front perineal area, Abdomen, Left upper leg, Right upper leg Body parts bathed by helper: Back, Buttocks Bathing not applicable: Right lower leg, Left lower leg Assist Level: Assistive device Assistive Device Comment: LH sponge and reacher  Function- Upper Body Dressing/Undressing What is the patient wearing?: Pull over shirt/dress Pull over shirt/dress - Perfomed by patient: Thread/unthread right sleeve, Thread/unthread left sleeve, Put head through opening Pull over shirt/dress - Perfomed by helper: Pull shirt over trunk Assist Level: Supervision or verbal cues Function - Lower Body Dressing/Undressing What is the patient wearing?: Pants Position: Bed Underwear - Performed by helper: Thread/unthread right underwear leg, Thread/unthread left underwear leg, Pull underwear up/down Pants- Performed by patient: Thread/unthread left pants leg Pants- Performed by helper: Thread/unthread right pants leg, Pull pants up/down Non-skid slipper socks- Performed by patient: Don/doff right sock (doff only) Non-skid slipper socks- Performed by helper: Don/doff left sock, Don/doff right sock Socks - Performed by helper: Don/doff right sock, Don/doff left sock Assist for footwear: Dependant Assist for lower body dressing: Touching or steadying assistance (Pt > 75%)  Function - Toileting Toileting activity did not occur: Safety/medical concerns Assist level: Two helpers (per Bath, NT report)  Function - Archivist transfer activity did not occur: Safety/medical concerns  Function - Chair/bed transfer Chair/bed transfer activity did not occur: Safety/medical concerns Chair/bed transfer method: Lateral scoot Chair/bed transfer assist level: 2 helpers Chair/bed transfer assistive device: Mechanical lift Mechanical lift: [place] Dec Chair/bed transfer details: Manual facilitation for weight shifting, Verbal cues for safe use of  DME/AE, Verbal cues for technique, Manual facilitation for placement  Function - Locomotion: Wheelchair Will patient use wheelchair at discharge?: Yes Type: Manual Wheelchair activity did not occur: Safety/medical concerns Max wheelchair distance: 75 Assist Level: Supervision or verbal cues Wheel 50 feet with 2 turns activity did not occur: Safety/medical concerns Assist Level: Supervision or verbal cues Wheel 150 feet activity did not occur: Safety/medical concerns Turns around,maneuvers to table,bed, and toilet,negotiates 3% grade,maneuvers on rugs and over doorsills: No Function - Locomotion: Ambulation Ambulation activity did not occur: Safety/medical concerns Walk 10 feet activity did not occur: Safety/medical concerns Walk 50 feet with 2 turns activity did not occur: Safety/medical concerns Walk 150 feet activity did not occur: Safety/medical concerns Walk 10 feet on uneven surfaces activity did not occur: Safety/medical concerns  Function - Comprehension Comprehension: Auditory Comprehension assist level: Understands complex 90% of the time/cues 10% of the time  Function - Expression Expression: Verbal Expression assistive device: Talk trach valve Expression assist level: Expresses complex 90% of the time/cues < 10% of the time  Function - Social Interaction Social Interaction assist level: Interacts appropriately 90% of the time - Needs monitoring or encouragement for participation or interaction.  Function - Problem Solving Problem solving assist level: Solves basic 75 - 89% of the time/requires cueing 10 - 24% of the time  Function - Memory Memory assist level: Recognizes or recalls 90% of the time/requires cueing < 10% of the time Patient normally able to recall (first 3 days only): Current season, Location of own room, Staff names and faces, That he or she is in a hospital  1. 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    2. LLE DVT treatment /Anticoagulation: Pharmaceutical: Coumadin---therapeutic  ,  No evidence of hemorrhage 3. Pain Management: Tylenol for prn for pain. Will continue oxycodone prn if needed for severe pain.Knee OA mod severe, bracing and voltaren gel  -would benefit from scheduled oxy prior to am therapies---will schedule one hour ahead 4. Mood: LCSW to follow for evaluation and support.  5. Neuropsych: This patient is capable of making decisions on his own behalf. 6. Skin/Wound Care:   sacral wound vac placed, If excess rectal stump drainage, may need to DC wound VAC and placed wet to dry dressing  - PRAFO ordered for heels, Cont air bed   -reviewed concept of serous drainage with patient 7. Fluids/Electrolytes/Nutrition: Monitor I/O. Will consult dietician to help with food choices. Increase prosource to TID. Added ensure between meals as unable to tolerate a lot of food at one time.   - Pt eating 100% of meals.  8. Abdominal abscess: Continue drain care bid.   ID on consult, Pt refused contrast has some improvement of at least one abscess with R drain removed, Per infectious disease okay to discontinue antibiotics and monitor 9. Hypokalemia: KCl supplements 10. VDRF/ History of OSA: To continue current trach till mobility improves per CCM. Tolerating humidified air. We will need to reconsult pulmonary before decannulationmobility improving, ask pulm to eval, has extra long trach #5,                       11. Anxiety disorder: resumed low dose Xanax as used this for insomnia at home. given his respiratory compromise will need to limit dosing.  Improving.  12. Anemia: Likely due to infection/chronic illness. Hgb stable at 9.3 on 10/24.  Will cont to monitor.                      13. Anasarca: Improved Continue Lasix, protein supplements.  14. Diastolic CHF: Off amiodarone, Ace and statin at this time due  to recurrent hypotension and multiple medical issues. Monitor for signs of overload. Weights stable.  15.  Hypomagnesemia-. Magnesium 800mg  BID, repeat Mg level on 10/24, normal at 1.7        16.Marland Kitchen. Urinary retention Resolved LOS (Days) 22 A FACE TO FACE EVALUATION WAS PERFORMED Davia Smyre E 09/18/2015, 8:42 AM

## 2015-09-18 NOTE — Consult Note (Addendum)
11/4 WOC follow-up: Vac dressing was changed yesterday; currently intact with good seal to sacrum.  If it becomes difficult to maintain seal or soiled with leakage from rectum, then bedside nurses have an order to discontinue and apply moist gauze packing.   Ileostomy pouch leaking last night, according to bedside nurse. Plan to change Vac at 0800 on Monday if it is still in place at that time. Stoma red and viable, slightly above skin level, 1 1/4 inches, with red macerated area surrounding to 2 cm around peristomal area where previous pouch had leaked. Pt states he has never had any ostomy teaching sessions, since he was very sick and does not remember previous hospital stay before arriving to rehab.   Demonstrated pouch change using 2 piece pouching system and barrier ring to maintain seal. Discussed pouching routines, emptying, and ordering supplies, and dietary precautions.  Pt asks appropriate questions and verbalizes understanding.  Supplies at bedside for staff nurse use and educational materials left at bedside. Pt is able to open and close the velcro to empty.  Recommend continued assistance with pouch application after discharge; either nursing facility or home health agency.  Plan to perform another teaching session on Monday at 0800. Placed on [place] discharge program: Yes Cammie Mcgeeawn Caeden Foots MSN, RN, Rober MinionCWOCN, [place]-AP, CNS [phone]

## 2015-09-18 NOTE — Progress Notes (Signed)
Physical Therapy Session Note  Patient Details  Name: Jonathan HarpsHarold D Markgraf MRN: [id number] Date of Birth: 11/22/1945  Today's Date: 09/18/2015 PT Individual Time: 1107-1139 PT Individual Time Calculation (min): 32 min   Short Term Goals: Week 3:  PT Short Term Goal 1 (Week 3): pt will move laterally on bed in supine without use of rails, with 1 questioning cue PT Short Term Goal 2 (Week 3): pt will transfer w/c>< level mat with slideboard , with mod assist of 2 people, for stabilizaing w/c and assisting pt PT Short Term Goal 3 (Week 3): pt will propel through cone obstacle course x 25' with min assist for steering PT Short Term Goal 4 (Week 3): pt will tolerate standing in [place] lift x 2 minutes  Skilled Therapeutic Interventions/Progress Updates:   Pt received in bed after using urinal; pt requesting assistance to re-fasten brief and pull up shorts.  Pt performed rolling L and R with min A to re-fasten brief and pull up shorts.  Discussed plan for therapy session with pt and discussed donning of knee brace for LE strengthening, standing and transfer to chair.  Pt stating he will not be eating lunch today due to CT scan later.  Pt requesting to stay in bed and work on LE strengthening.  Pt reporting needing to urinate again.  Pt performed rolling x 2 with min A to doff shorts and brief; pt set up with urinal.  Pt performed placement and removal of urinal but did require assistance again to re-fasten brief.  Pt reporting discomfort in LLE under ace wrap.  Ace wrap removed and significant edema noted in L foot.  Pt bed placed in reverse trendelenberg to simulate tilt table and pt instructed in 12 reps bilat LE squat<>extension against partial gravity as well as maintaining one LE extension with alternating LE flexion (marching against partial gravity). Pt began to c/o pain in knees; transitioned back to full supine and performed 12 reps open chain hip ABD and hip flexion with AAROM to prevent shear on heels.   At end of session pt positioned with HOB elevated and all items within reach.   Therapy Documentation Precautions:  Precautions Precautions: Fall Precaution Comments: sacral decub, trach, drain lines from abdomen Restrictions Weight Bearing Restrictions: No Other Position/Activity Restrictions: trach collar Vital Signs: Therapy Vitals Pulse Rate: 87 Resp: 18 Patient Position (if appropriate): Lying Oxygen Therapy SpO2: 96 % O2 Device: Not Delivered Pain: Pain Assessment Pain Assessment: Faces Pain Score: 4  Faces Pain Scale: Hurts a little bit Pain Type: Chronic pain Pain Location: Knee Pain Orientation: Right;Left Pain Descriptors / Indicators: Sore;Aching Pain Onset: With Activity Pain Intervention(s): Rest (premedicated with tylenol and Voltaren gel)  See Function Navigator for Current Functional Status.   Therapy/Group: Individual Therapy  Edman CircleHall, Markale Birdsell Faucette 09/18/2015, 11:59 AM

## 2015-09-18 NOTE — Progress Notes (Signed)
Physical Therapy Session Note  Patient Details  Name: Jonathan Le MRN: [id number] Date of Birth: [birth year]  Today's Date: 09/18/2015 PT Individual Time: 1403-1502 PT Individual Time Calculation (min): 59 min   Short Term Goals: Week 3:  PT Short Term Goal 1 (Week 3): pt will move laterally on bed in supine without use of rails, with 1 questioning cue PT Short Term Goal 2 (Week 3): pt will transfer w/c>< level mat with slideboard , with mod assist of 2 people, for stabilizaing w/c and assisting pt PT Short Term Goal 3 (Week 3): pt will propel through cone obstacle course x 25' with min assist for steering PT Short Term Goal 4 (Week 3): pt will tolerate standing in [place] lift x 2 minutes  Skilled Therapeutic Interventions/Progress Updates:   Pt received supine in bed and agreeable to therapy.  Session focused on functional bed mobility, LE strengthening exercises, activity tolerance, dynamic sitting balance, and patient education.  Pt transitioned from supine>sitting EOB with max assist due to fatigue, PT assisted with lifting legs from bed and elevating trunk.  PT instructed patient in BLE therex sitting EOB x10 reps for heel/toe raises, LAQ, seated marching, pillow squeezes, and hip abduction against manual resistance from PT.  Pt returned to supine with mod assist to lift LEs into bed and +2 assist to slide to [facility] using draw sheet. PT instructed patient in BLE therex in supine x10 reps for heel slides, hip abd/add to midline, SLR, SAQ, glute sets, and quad sets.  PT also performed x20 seconds hold for heel cold stretch and hamstring stretch bilaterally.  Pt reporting discomfort/burning on bottom, so rolled to side and PT noted nonblanching redness on bottom, RN in to assess and applied covering to protect from further skin breakdown.  Checked for other sites of skin breakdown and none noted.  Pt positioned to comfort with call bell in reach and needs met.    Therapy Documentation Precautions:   Precautions Precautions: Fall Precaution Comments: sacral decub, trach, drain lines from abdomen Restrictions Weight Bearing Restrictions: No Other Position/Activity Restrictions: trach collar General:   Vital Signs: Therapy Vitals Temp: 97.5 F (36.4 C) Temp Source: Oral Pulse Rate: 90 Resp: 20 BP: 111/73 mmHg Patient Position (if appropriate): Sitting Oxygen Therapy SpO2: 100 % O2 Device: Not Delivered Pain: Pain Assessment Pain Assessment: 0-10 Mobility:   Locomotion :    Trunk/Postural Assessment :    Balance:   Exercises:   Other Treatments:     See Function Navigator for Current Functional Status.   Therapy/Group: Individual Therapy  Earnest Conroy Penven-Crew 09/18/2015, 4:52 PM

## 2015-09-18 NOTE — Progress Notes (Signed)
ANTICOAGULATION CONSULT NOTE - Follow Up Consult  Pharmacy Consult for coumadin Indication: DVT  Allergies  Allergen Reactions  . Unasyn [Ampicillin-Sulbactam Sodium] Hives, Itching and Rash    Diffuse rash not responding to Pepcid and Benadryl.  TDD.  Marland Kitchen. Levaquin [Levofloxacin] Nausea Only  . Penicillins Hives  . Doxycycline Rash    Patient Measurements: Height: 5\' 9"  (175.3 cm) Weight: 236 lb 1.8 oz (107.1 kg) IBW/kg (Calculated) : 70.7 Heparin Dosing Weight:   Vital Signs: Temp: 97.8 F (36.6 C) (11/04 0423) Temp Source: Oral (11/04 0423) BP: 103/59 mmHg (11/04 0423) Pulse Rate: 87 (11/04 0838)  Labs:  Recent Labs  09/16/15 0530 09/17/15 0510 09/18/15 0437  LABPROT 29.2* 28.6* 25.6*  INR 2.81* 2.74* 2.36*    Estimated Creatinine Clearance: 103.7 mL/min (by C-G formula based on Cr of 0.3).   Medications:  Scheduled:  . acetaminophen  650 mg Oral BID WC  . antiseptic oral rinse  15 mL Mouth Rinse TID PC  . diclofenac sodium  2 g Topical QID  . famotidine  20 mg Oral BID  . feeding supplement (PRO-STAT SUGAR FREE 64)  30 mL Oral TID  . finasteride  5 mg Oral Daily  . furosemide  20 mg Oral Daily  . furosemide  40 mg Oral Daily  . hydrocerin   Topical BID  . insulin aspart  0-5 Units Subcutaneous QHS  . insulin aspart  0-9 Units Subcutaneous TID WC  . magnesium oxide  800 mg Oral BID  . multivitamin with minerals  1 tablet Oral Daily  . MUSCLE RUB   Topical TID AC  . oxyCODONE  2.5 mg Oral Q0600  . potassium chloride  40 mEq Oral BID  . sodium chloride  10 mL Intravenous Q12H  . tamsulosin  0.4 mg Oral QPC supper  . vitamin C  500 mg Oral BID  . Warfarin - Pharmacist Dosing Inpatient   Does not apply q1800   Infusions:    Assessment: 69 yo male with DVT is currently on therapeutic coumadin.  INR today is 2.36.  Goal of Therapy:  INR 2-3 Monitor platelets by anticoagulation protocol: Yes   Plan:  Coumadin 5mg  po daily PT/INR in am  Bayard HuggerMei Antavius Sperbeck,  PharmD, BCPS  Clinical Pharmacist  Pager: [phone]   09/18/2015,1:37 PM

## 2015-09-18 NOTE — Progress Notes (Signed)
Patient to go down for CT of abdomen.  Transport here to take patient down.  Patient states he needs to urinate before going but is having difficulty getting started.  Darin EngelsAbraham, NT bladder scanned patient for 15 ml.  Straight cathed patient for 300 ml after getting approval to do so by Marissa NestlePam Love, PA.  Patient transported to radiology. Dani Gobbleeardon, Lashun Ramseyer J, RN

## 2015-09-18 NOTE — Progress Notes (Signed)
Physical Therapy Note  Patient Details  Name: Jonathan Le MRN: [id number] Date of Birth: 02/03/1946 Today's Date: 09/18/2015    Time: 900-957 57 minutes  1:1 No c/o pain.  Pt performed rolling with mod/max A with bed rails multiple repetitions to don brief and clothing.  PT donned Rt knee brace and L ace wrap to knee.  Pt performed supine to sit with max A for trunk due to weakness.  Sit to stands x 12 reps with STEDY with focus on squeezing glutes to come to full standing. Pt unable to achieve full standing despite +2 max A.  Pt with less c/o pain in LEs, improving ability to sit EOB without pain.  Pt left in bed with needs at hand. Nursing to remove wound vac due to difficulty maintaining a seal.   Jonathan Le 09/18/2015, 9:58 AM

## 2015-09-18 NOTE — Progress Notes (Signed)
Stoma covered looks well. Patient tolerating well.

## 2015-09-18 NOTE — Consult Note (Signed)
WOC follow-up: Called by PT related to Vac leaking during therapy.  Have attempted Vac dressing and changed 2 days in a row due to leaking and unable to maintain a seal.  Plan to discontinue this topical treatment and bedside nurses can pack sacral wound with moist gauze Q day.  Discussed plan of care with CCS team yesterday.  Informed patient of this plan this AM and he verbalizes understanding. Cammie Mcgeeawn Nimsi Males MSN, RN, CWOCN, [place]CN-AP, CNS [phone]

## 2015-09-18 NOTE — Progress Notes (Signed)
Patient has home CPAP, states that he can place self on and off. RT made pt aware that if he needed anything to call. RT will continue to monitor.

## 2015-09-18 NOTE — Progress Notes (Signed)
Referring Physician(s): Derrell LollingRamirez  Chief Complaint:  Intraabdominal abscesses Post colectomy/colostomy  Subjective:  B abd drains placed 07/21/15 2 additional Left drains placed 9/29 R drain removed 09/01/2015 Output of drains all over 30cc/day Total 100 cc daily Pt does feel better  Does complain that when he stood up to participate in Rehab exercises yesterday--- Fluid fell out from rectal area---gushing  Serous color per notes and PA  Allergies: Unasyn; Levaquin; Penicillins; and Doxycycline  Medications: Prior to Admission medications   Medication Sig Start Date End Date Taking? Authorizing Provider  Amino Acids-Protein Hydrolys (FEEDING SUPPLEMENT, PRO-STAT SUGAR FREE 64,) LIQD Place 30 mLs into feeding tube 5 (five) times daily. 07/25/15  Yes [name], NP  white petrolatum (VASELINE) GEL Apply 1 application topically daily. Apply to L nare wound daily 07/08/15  Yes [name], NP     Vital Signs: BP 103/59 mmHg  Pulse 87  Temp(Src) 97.8 F (36.6 C) (Oral)  Resp 18  Ht 5\' 9"  (1.753 m)  Wt 236 lb 1.8 oz (107.1 kg)  BMI 34.85 kg/m2  SpO2 96%  Physical Exam  Constitutional: He is oriented to person, place, and time.  Abdominal: Soft.  Neurological: He is alert and oriented to person, place, and time.  Skin: Skin is warm.  All 3 sites of L abd drain are clean and dry NT No bleeding Drain: 40 cc/35 cc/ 25 cc All drains with 20 cc in JPs Serous color     Imaging: Dg Knee 1-2 Views Right  09/16/2015  CLINICAL DATA:  69 year old male with chronic right knee pain. No specific injury. Initial encounter. EXAM: RIGHT KNEE - 1-2 VIEW COMPARISON:  None. FINDINGS: Moderate to severe tricompartmental joint space loss and bulky osteophytosis. Small suprapatellar joint effusion suspected. Bulky superior patellar osteophyte. Superimposed calcified atherosclerosis about the knee. No acute fracture or dislocation identified. IMPRESSION: 1. Advanced  tricompartmental degenerative changes. No acute osseous abnormality identified. 2. Calcified peripheral vascular disease. Electronically Signed   By: Odessa FlemingH  Hall M.D.   On: 09/16/2015 15:27    Labs:  CBC:  Recent Labs  08/20/15 0648 08/28/15 0430 09/07/15 0433 09/14/15 0510  WBC 5.2 6.6 6.3 6.4  HGB 8.8* 8.2* 9.3* 9.4*  HCT 29.3* 26.9* 31.2* 29.4*  PLT 328 349 311 303    COAGS:  Recent Labs  07/21/15 0815  07/23/15 1200 07/25/15 0419 07/30/15 0627  09/15/15 0530 09/16/15 0530 09/17/15 0510 09/18/15 0437  INR 1.65*  < > 2.62* 1.48 1.35  < > 2.76* 2.81* 2.74* 2.36*  APTT 29  --  38* 28 31  --   --   --   --   --   < > = values in this interval not displayed.  BMP:  Recent Labs  08/31/15 0445 09/07/15 0433 09/13/15 0515 09/14/15 0510  NA 135 137 134* 138  K 4.0 3.5 3.6 3.4*  CL 98* 103 99* 102  CO2 32 30 29 29   GLUCOSE 94 95 87 87  BUN 10 9 7 9   CALCIUM 8.1* 8.1* 8.0* 8.1*  CREATININE 0.33* 0.37* 0.35* 0.30*  GFRNONAA >60 >60 >60 >60  GFRAA >60 >60 >60 >60    LIVER FUNCTION TESTS:  Recent Labs  07/23/15 0600 07/24/15 0345 07/25/15 0419  07/28/15 0630 08/02/15 0620 08/16/15 0701 08/28/15 0430  BILITOT 1.0 0.7 0.9  --   --   --   --  0.6  AST 30 40 35  --   --   --   --  22  ALT 15* 15* 14*  --   --   --   --  10*  ALKPHOS 154* 167* 199*  --   --   --   --  80  PROT 4.6* 4.3* 4.5*  --   --   --   --  5.2*  ALBUMIN <1.0* <1.0* <1.0*  < > 1.4* 1.2* 1.2* 1.6*  < > = values in this interval not displayed.  Assessment and Plan:  abd abscesses Draining well For CT when output 10 cc/day per drain Consider CT now secondary to fluid release from rectal area  Signed: Ceria Suminski A 09/18/2015, 10:52 AM   I spent a total of 15 Minutes at the the patient's bedside AND on the patient's hospital floor or unit, greater than 50% of which was counseling/coordinating care for abscess drains

## 2015-09-18 NOTE — Progress Notes (Signed)
Brief follow up for decannulation of tracheostmy 11/16/2014 BP 103/59 mmHg  Pulse 87  Temp(Src) 97.8 F (36.6 C) (Oral)  Resp 18  Ht 5\' 9"  (1.753 m)  Wt 236 lb 1.8 oz (107.1 kg)  BMI 34.85 kg/m2  SpO2 96% Dg Knee 1-2 Views Right  09/16/2015  CLINICAL DATA:  69 year old male with chronic right knee pain. No specific injury. Initial encounter. EXAM: RIGHT KNEE - 1-2 VIEW COMPARISON:  None. FINDINGS: Moderate to severe tricompartmental joint space loss and bulky osteophytosis. Small suprapatellar joint effusion suspected. Bulky superior patellar osteophyte. Superimposed calcified atherosclerosis about the knee. No acute fracture or dislocation identified. IMPRESSION: 1. Advanced tricompartmental degenerative changes. No acute osseous abnormality identified. 2. Calcified peripheral vascular disease. Electronically Signed   By: Odessa FlemingH  Hall M.D.   On: 09/16/2015 15:27   A/P Decannulation 11/3 with no further distress. PCCM will sign off.  Brett CanalesSteve Leilanny Fluitt ACNP Adolph PollackLe Bauer PCCM Pager [phone] till 3 pm If no answer page [phone] 09/18/2015, 12:36 PM

## 2015-09-19 ENCOUNTER — Inpatient Hospital Stay (HOSPITAL_COMMUNITY): Payer: Medicare Other | Admitting: Occupational Therapy

## 2015-09-19 LAB — PROTIME-INR
INR: 2.27 — AB (ref 0.00–1.49)
Prothrombin Time: 24.8 seconds — ABNORMAL HIGH (ref 11.6–15.2)

## 2015-09-19 LAB — GLUCOSE, CAPILLARY
GLUCOSE-CAPILLARY: 88 mg/dL (ref 65–99)
GLUCOSE-CAPILLARY: 110 mg/dL — AB (ref 65–99)
GLUCOSE-CAPILLARY: 99 mg/dL (ref 65–99)
Glucose-Capillary: 105 mg/dL — ABNORMAL HIGH (ref 65–99)

## 2015-09-19 NOTE — Progress Notes (Signed)
Patient ID: Jonathan Le, male   DOB: 01/22/1946, 69 y.o.   MRN: [id number]  09/19/15.  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     Subjective/Complaints: Comfortable night  Denies any shortness of breath. Slept well last night with CPAP.                                                                 Past Medical History  Diagnosis Date  . Anxiety   . Hypertension   . Arthritis   . Sleep apnea     wears CPAP nightly  . Glaucoma   . CHF (congestive heart failure) (HCC)     MI, with open heart surgery x 30 sdays ago per pt. (06/26/15)  . S/P CABG (coronary artery bypass graft)     Intake/Output Summary (Last 24 hours) at 09/19/15 0839 Last data filed at 09/19/15 0800  Gross per 24 hour  Intake   4645 ml  Output   2795 ml  Net   1850 ml    Patient Vitals for the past 24 hrs:  BP Temp Temp src Pulse Resp SpO2 Weight  09/19/15 0447 98/60 mmHg 97.3 F (36.3 C) Oral 84 18 99 % 238 lb 15.7 oz (108.4 kg)  09/18/15 2056 - - - 88 18 98 % -  09/18/15 1428 111/73 mmHg 97.5 F (36.4 C) Oral 90 20 100 % -   CBG (last 3)   Recent Labs  09/18/15 1630 09/18/15 2056 09/19/15 0712  GLUCAP 88 116* 88     Lab Results  Component Value Date   INR 2.27* 09/19/2015   INR 2.36* 09/18/2015   INR 2.74* 09/17/2015        ROS:   Denies CP, SOB, n/v/d. Strength improving. No insomnia, anxiety, appetite ok, pain controlled  Objective: Vital Signs: Blood pressure 98/60, pulse 84, temperature 97.3 F (36.3 C), temperature source Oral, resp. rate 18, height 5\' 9"  (1.753 m), weight 238 lb 15.7 oz (108.4 kg), SpO2 99 %. Ct Abdomen Pelvis W Contrast  09/18/2015  CLINICAL DATA:  Follow-up abdominal abscess. Status post colectomy for ischemic colitis. EXAM: CT ABDOMEN AND PELVIS WITH CONTRAST TECHNIQUE: Multidetector CT  imaging of the abdomen and pelvis was performed using the standard protocol following bolus administration of intravenous contrast. CONTRAST:  100 cc Omnipaque 300 IV. COMPARISON:  08/31/2015 CT abdomen/pelvis. FINDINGS: Lower chest: Lingular 4 mm pulmonary nodule (series 3/image 5), stable. Small left pleural, decreased. Passive segmental left lower lobe dependent atelectasis, slightly decreased. Right, left anterior descending and left circumflex coronary atherosclerotic calcifications. Hepatobiliary: Normal liver with no liver mass. Normal gallbladder with no radiopaque cholelithiasis. No biliary ductal dilatation. Pancreas: Normal, with no mass or duct dilation. Spleen: Normal size. No mass. Adrenals/Urinary Tract: Normal adrenals. Nonobstructing tiny right renal stones, largest 4 mm in the upper right kidney. Nonobstructing 4 mm lower left renal stone. No hydronephrosis. Stable 4 mm stone in the dependent right bladder. Stomach/Bowel: Grossly normal stomach. Normal caliber small bowel with no small bowel wall thickening. Oral contrast traverses to the right lower quadrant end ileostomy. Status post total colectomy. The remnant is mildly distended by fluid  with new nonspecific rectal wall thickening without appreciable rectal wall pneumatosis or new perirectal fat stranding. Vascular/Lymphatic: Atherosclerotic nonaneurysmal abdominal aorta. Patent portal, splenic, hepatic and renal veins. Stable patent prominent splenorenal shunt. No pathologically enlarged lymph nodes in the abdomen or pelvis. Reproductive: Stable moderate prostatomegaly. Other: Interval removal of the right percutaneous drainage catheter, with residual 2.6 x 1.5 cm anterior right lower quadrant fluid collection (series 8/image 30), decreased from 5.7 x 2.0 cm. Three percutaneous drainage catheter is in the lateral left abdominal wall are stable in position. There is a tiny 1.0 x 0.7 cm thick-walled fluid collection in the lateral left  abdominal wall just inferior to the superior most drainage catheter (series 2/ image 51), decreased from 1.8 x 1.0 cm. There is persistent ill-defined fat stranding and enhancement throughout the lateral left abdominal wall with minimal scattered internal foci of gas. No new focal definable collection is noted. No free intraperitoneal gas. No ascites. Musculoskeletal: No aggressive appearing focal osseous lesions. Visualized lower sternotomy wires are intact. Severe degenerative changes in the lumbar spine. Stable small fat containing left inguinal hernia. IMPRESSION: 1. Interval removal of right percutaneous drainage catheter, with residual small anterior right lower quadrant fluid collection, decreased. 2. Tiny residual abscess in the lateral left abdominal muscle wall, decreased. Persistent inflammatory changes in the lateral left abdominal muscle wall. No new fluid collections. 3. 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 4. New nonspecific wall thickening in the rectal remnant without pneumatosis, cannot exclude proctitis. 5. Numerous stable chronic findings as above. Electronically Signed   By: Delbert Phenix M.D.   On: 09/18/2015 19:07   Results for orders placed or performed during the hospital encounter of 08/27/15 (from the past 72 hour(s))  Glucose, capillary     Status: Abnormal   Collection Time: 09/16/15 12:09 PM  Result Value Ref Range   Glucose-Capillary 116 (H) 65 - 99 mg/dL  Glucose, capillary     Status: None   Collection Time: 09/16/15  4:47 PM  Result Value Ref Range   Glucose-Capillary 96 65 - 99 mg/dL  Glucose, capillary     Status: Abnormal   Collection Time: 09/16/15  8:46 PM  Result Value Ref Range   Glucose-Capillary 102 (H) 65 - 99 mg/dL  Protime-INR     Status: Abnormal   Collection Time: 09/17/15  5:10 AM  Result Value Ref Range   Prothrombin Time 28.6 (H) 11.6 - 15.2 seconds   INR 2.74 (H)  0.00 - 1.49  Glucose, capillary     Status: None   Collection Time: 09/17/15  6:40 AM  Result Value Ref Range   Glucose-Capillary 89 65 - 99 mg/dL  Glucose, capillary     Status: Abnormal   Collection Time: 09/17/15 12:05 PM  Result Value Ref Range   Glucose-Capillary 128 (H) 65 - 99 mg/dL  Glucose, capillary     Status: Abnormal   Collection Time: 09/17/15  4:47 PM  Result Value Ref Range   Glucose-Capillary 100 (H) 65 - 99 mg/dL  Glucose, capillary     Status: Abnormal   Collection Time: 09/17/15  9:07 PM  Result Value Ref Range   Glucose-Capillary 105 (H) 65 - 99 mg/dL   Comment 1 Notify RN   Protime-INR     Status: Abnormal   Collection Time: 09/18/15  4:37 AM  Result Value Ref Range   Prothrombin Time 25.6 (H) 11.6 - 15.2 seconds   INR 2.36 (  H) 0.00 - 1.49  Glucose, capillary     Status: None   Collection Time: 09/18/15  4:37 AM  Result Value Ref Range   Glucose-Capillary 88 65 - 99 mg/dL   Comment 1 Notify RN   Glucose, capillary     Status: None   Collection Time: 09/18/15  6:58 AM  Result Value Ref Range   Glucose-Capillary 93 65 - 99 mg/dL   Comment 1 Notify RN   Glucose, capillary     Status: None   Collection Time: 09/18/15 11:38 AM  Result Value Ref Range   Glucose-Capillary 99 65 - 99 mg/dL   Comment 1 Notify RN   Glucose, capillary     Status: None   Collection Time: 09/18/15  4:30 PM  Result Value Ref Range   Glucose-Capillary 88 65 - 99 mg/dL   Comment 1 Notify RN   Glucose, capillary     Status: Abnormal   Collection Time: 09/18/15  8:56 PM  Result Value Ref Range   Glucose-Capillary 116 (H) 65 - 99 mg/dL  Protime-INR     Status: Abnormal   Collection Time: 09/19/15  4:45 AM  Result Value Ref Range   Prothrombin Time 24.8 (H) 11.6 - 15.2 seconds   INR 2.27 (H) 0.00 - 1.49  Glucose, capillary     Status: None   Collection Time: 09/19/15  7:12 AM  Result Value Ref Range   Glucose-Capillary 88 65 - 99 mg/dL    Nursing note and vitals  reviewed. Constitutional: He is oriented to person, place, and time.he is slower today He appears well-developed and well-nourished. No distress.  HENT:  Head: Normocephalic and atraumatic.  Eyes: Conjunctivae and EOM are normal.   Neck: Decreased range of motion present.  healing trach site Cardiovascular: Normal rate and regular rhythm. Slight resting tachy Respiratory: Effort normal and breath sounds normal. No respiratory distress. He has no wheezes.    GI: Soft. Bowel sounds are normal.  Non-tender. Midline incision has healed . Ostomy with bilious drainage which appears decreased. Multiple drains LLQ sutured in place.no erythema  Genitourinary:  Foley in place.  Musculoskeletal: He exhibits edema b/l LE  Right knee with effusion. No tenderness.  Skin:  +2 edema  Right lower shin with stasis changes.   Neurological: He is alert and oriented to person, place, and time.    Speech clear. Follows basic commands without difficulty.     Sensation diminished in feet.   Assessment:   1. 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    2. LLE DVT treatment /Anticoagulation: Pharmaceutical: Coumadin---therapeutic  3. Pain Management: Tylenol for prn for pain. Will continue oxycodone prn if needed for severe pain.Knee OA mod severe, bracing and voltaren gel  -would benefit from scheduled oxy prior to am therapies---will schedule one hour ahead  4. Skin/Wound Care:   sacral wound vac placed, If excess rectal stump drainage, may need to DC wound VAC and placed wet to dry dressing  - PRAFO ordered for heels, Cont air bed   -reviewed concept of serous drainage with patient  5. Abdominal abscess: Continue drain care bid.   ID on consult, Pt refused contrast has some improvement of at least one abscess with R drain removed, Per infectious disease okay to  discontinue antibiotics and monitor 6. Hypokalemia: KCl supplements 7. VDRF/ History of OSA: To continue current trach till mobility improves per CCM. Tolerating  humidified air. We will need to reconsult pulmonary before decannulationmobility improving, ask pulm to eval, has extra long trach #5,                       11. Anxiety disorder: resumed low dose Xanax as used this for insomnia at home. given his respiratory compromise will need to limit dosing.  Improving.  8. Anemia: Likely due to infection/chronic illness. Hgb stable at 9.3 on 10/24.  Will cont to monitor.                       9. Diastolic CHF: Off amiodarone, Ace and statin at this time due to recurrent hypotension and multiple medical issues. Monitor for signs of overload. Weights stable.   LOS (Days) 23 A FACE TO FACE EVALUATION WAS PERFORMED Rogelia Boga 09/19/2015, 8:36 AM

## 2015-09-19 NOTE — Progress Notes (Signed)
Occupational Therapy Session Note  Patient Details  Name: Jonathan Le MRN: [id number] Date of Birth: [birth year]  Today's Date: 09/19/2015 OT Individual Time:  -     4035-2481 (min)   Short Term Goals: Week 1:  OT Short Term Goal 1 (Week 1): Pt will complete bathing with mod assist seated at EOB with assist of 1 caregiver OT Short Term Goal 1 - Progress (Week 1): Not met OT Short Term Goal 2 (Week 1): Pt will complete UB dressing with mod assist and min cues for problem solving/organization OT Short Term Goal 2 - Progress (Week 1): Met OT Short Term Goal 3 (Week 1): Pt will complete LB dressing at bed level with max assist OT Short Term Goal 3 - Progress (Week 1): Not met OT Short Term Goal 4 (Week 1): Pt will complete sit > stand from EOB with +2 assist to complete 1 grooming task OT Short Term Goal 4 - Progress (Week 1): Not met  Skilled Therapeutic Interventions/Progress Updates:    Addressed standing tolerance using stedy.  Pt. Stood for 12 minutes after 3 attempts.  Stood with stedy at sink and combed hair and brushed teeth.  Placed pt in bed at end of session.   Therapy Documentation Precautions:  Precautions Precautions: Fall Precaution Comments: sacral decub, trach, drain lines from abdomen Restrictions Weight Bearing Restrictions: No Other Position/Activity Restrictions: trach collar :      Pain:  none               See Function Navigator for Current Functional Status.   Therapy/Group: Individual Therapy  Lisa Roca 09/19/2015, 6:33 PM

## 2015-09-19 NOTE — Progress Notes (Signed)
ANTICOAGULATION CONSULT NOTE - Follow Up Consult  Pharmacy Consult for coumadin Indication: DVT  Allergies  Allergen Reactions  . Unasyn [Ampicillin-Sulbactam Sodium] Hives, Itching and Rash    Diffuse rash not responding to Pepcid and Benadryl.  TDD.  Marland Kitchen. Levaquin [Levofloxacin] Nausea Only  . Penicillins Hives  . Doxycycline Rash    Patient Measurements: Height: 5\' 9"  (175.3 cm) Weight: 238 lb 15.7 oz (108.4 kg) IBW/kg (Calculated) : 70.7  Vital Signs: Temp: 97.3 F (36.3 C) (11/05 0447) Temp Source: Oral (11/05 0447) BP: 98/60 mmHg (11/05 0447) Pulse Rate: 84 (11/05 0447)  Labs:  Recent Labs  09/17/15 0510 09/18/15 0437 09/19/15 0445  LABPROT 28.6* 25.6* 24.8*  INR 2.74* 2.36* 2.27*    Estimated Creatinine Clearance: 104.3 mL/min (by C-G formula based on Cr of 0.3).   Medications:  Scheduled:  . acetaminophen  650 mg Oral BID WC  . antiseptic oral rinse  15 mL Mouth Rinse TID PC  . diclofenac sodium  2 g Topical QID  . famotidine  20 mg Oral BID  . feeding supplement (PRO-STAT SUGAR FREE 64)  30 mL Oral TID  . finasteride  5 mg Oral Daily  . furosemide  20 mg Oral Daily  . furosemide  40 mg Oral Daily  . hydrocerin   Topical BID  . insulin aspart  0-5 Units Subcutaneous QHS  . insulin aspart  0-9 Units Subcutaneous TID WC  . magnesium oxide  800 mg Oral BID  . multivitamin with minerals  1 tablet Oral Daily  . MUSCLE RUB   Topical TID AC  . oxyCODONE  2.5 mg Oral Q0600  . potassium chloride  40 mEq Oral BID  . sodium chloride  10 mL Intravenous Q12H  . tamsulosin  0.4 mg Oral QPC supper  . vitamin C  500 mg Oral BID  . warfarin  5 mg Oral q1800  . Warfarin - Pharmacist Dosing Inpatient   Does not apply q1800   Infusions:    Assessment: 69 yo male on warfarin for recent LLE DVT. INR remains therapeutic 2.36>>2.27, 5mg /day. Pink urine on 10/22 - resolved. Hgb improved to 9.4, pltc wnl on 10/31 - no new CBC.   Goal of Therapy:  INR 2-3 Monitor  platelets by anticoagulation protocol: Yes   Plan:  Coumadin 5 mg po x1 daily Daily INR - if INR stable, could consider MWF  Babs BertinHaley Sawsan Riggio, PharmD Clinical Pharmacist Pager [phone] 09/19/2015 9:28 AM

## 2015-09-19 NOTE — Progress Notes (Signed)
Patient has home CPAP unit and places himself on and off. Will call if any assistance needed.

## 2015-09-20 ENCOUNTER — Inpatient Hospital Stay (HOSPITAL_COMMUNITY): Payer: Medicare Other | Admitting: Occupational Therapy

## 2015-09-20 LAB — GLUCOSE, CAPILLARY
GLUCOSE-CAPILLARY: 105 mg/dL — AB (ref 65–99)
Glucose-Capillary: 81 mg/dL (ref 65–99)
Glucose-Capillary: 104 mg/dL — ABNORMAL HIGH (ref 65–99)
Glucose-Capillary: 84 mg/dL (ref 65–99)

## 2015-09-20 LAB — PROTIME-INR
INR: 2.48 — AB (ref 0.00–1.49)
PROTHROMBIN TIME: 26.5 s — AB (ref 11.6–15.2)

## 2015-09-20 MED ORDER — FUROSEMIDE 20 MG PO TABS
20.0000 mg | ORAL_TABLET | ORAL | Status: DC
  Filled 2015-09-20 (×3): qty 1

## 2015-09-20 NOTE — Progress Notes (Signed)
Patient ID: DEWAIN PLATZ, male   DOB: 01/10/1946, 69 y.o.   MRN: [id number]  Patient ID: MARQUIS DOWN, male   DOB: 1946/06/13, 69 y.o.   MRN: [id number]  09/20/15.  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     Subjective/Complaints: Comfortable night  Denies any shortness of breath. Slept well last night with CPAP.    Concerned about possible d/c in 4 days.  Has noted increase in rectal drainage.  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  Remains afebrile                                                            Past Medical History  Diagnosis Date  . Anxiety   . Hypertension   . Arthritis   . Sleep apnea     wears CPAP nightly  . Glaucoma   . CHF (congestive heart failure) (HCC)     MI, with open heart surgery x 30 sdays ago per pt. (06/26/15)  . S/P CABG (coronary artery bypass graft)     Intake/Output Summary (Last 24 hours) at 09/20/15 0754 Last data filed at 09/20/15 0600  Gross per 24 hour  Intake   4110 ml  Output   2320 ml  Net   1790 ml    Patient Vitals for the past 24 hrs:  BP Temp Temp src Pulse Resp SpO2  09/20/15 0608 98/60 mmHg 97.7 F (36.5 C) Oral 95 20 99 %  09/19/15 1420 122/71 mmHg 97.7 F (36.5 C) Oral 91 18 96 %   CBG (last 3)   Recent Labs  09/19/15 1638 09/19/15 2056 09/20/15 0650  GLUCAP 99 105* 81     Lab Results  Component Value Date   INR 2.48* 09/20/2015   INR 2.27* 09/19/2015   INR 2.36* 09/18/2015        ROS:   Denies CP, SOB, n/v/d. Strength improving. No insomnia, anxiety, appetite ok, pain controlled  Objective: Vital Signs: Blood pressure 98/60, pulse 95, temperature 97.7 F (36.5 C), temperature source Oral, resp. rate 20, height 5\' 9"  (1.753 m), weight 238 lb 15.7 oz (108.4 kg), SpO2 99 %. Ct Abdomen Pelvis W  Contrast  09/18/2015  CLINICAL DATA:  Follow-up abdominal abscess. Status post colectomy for ischemic colitis. EXAM: CT ABDOMEN AND PELVIS WITH CONTRAST TECHNIQUE: Multidetector CT imaging of the abdomen and pelvis was performed using the standard protocol following bolus administration of intravenous contrast. CONTRAST:  100 cc Omnipaque 300 IV. COMPARISON:  08/31/2015 CT abdomen/pelvis. FINDINGS: Lower chest: Lingular 4 mm pulmonary nodule (series 3/image 5), stable. Small left pleural, decreased. Passive segmental left lower lobe dependent atelectasis, slightly decreased. Right, left anterior descending and left circumflex coronary atherosclerotic calcifications. Hepatobiliary: Normal liver with no liver mass. Normal gallbladder with no radiopaque cholelithiasis. No biliary ductal dilatation. Pancreas: Normal, with no mass or duct dilation. Spleen: Normal size. No mass. Adrenals/Urinary Tract: Normal adrenals. Nonobstructing tiny right renal stones, largest 4 mm in the upper right kidney. Nonobstructing 4 mm lower left renal stone. No hydronephrosis. Stable 4 mm stone in the dependent right bladder. Stomach/Bowel: Grossly normal stomach. Normal caliber small bowel with no small  bowel wall thickening. Oral contrast traverses to the right lower quadrant end ileostomy. Status post total colectomy. The remnant is mildly distended by fluid with new nonspecific rectal wall thickening without appreciable rectal wall pneumatosis or new perirectal fat stranding. Vascular/Lymphatic: Atherosclerotic nonaneurysmal abdominal aorta. Patent portal, splenic, hepatic and renal veins. Stable patent prominent splenorenal shunt. No pathologically enlarged lymph nodes in the abdomen or pelvis. Reproductive: Stable moderate prostatomegaly. Other: Interval removal of the right percutaneous drainage catheter, with residual 2.6 x 1.5 cm anterior right lower quadrant fluid collection (series 8/image 30), decreased from 5.7 x 2.0 cm.  Three percutaneous drainage catheter is in the lateral left abdominal wall are stable in position. There is a tiny 1.0 x 0.7 cm thick-walled fluid collection in the lateral left abdominal wall just inferior to the superior most drainage catheter (series 2/ image 51), decreased from 1.8 x 1.0 cm. There is persistent ill-defined fat stranding and enhancement throughout the lateral left abdominal wall with minimal scattered internal foci of gas. No new focal definable collection is noted. No free intraperitoneal gas. No ascites. Musculoskeletal: No aggressive appearing focal osseous lesions. Visualized lower sternotomy wires are intact. Severe degenerative changes in the lumbar spine. Stable small fat containing left inguinal hernia. IMPRESSION: 1. Interval removal of right percutaneous drainage catheter, with residual small anterior right lower quadrant fluid collection, decreased. 2. Tiny residual abscess in the lateral left abdominal muscle wall, decreased. Persistent inflammatory changes in the lateral left abdominal muscle wall. No new fluid collections. 3. 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 4. New nonspecific wall thickening in the rectal remnant without pneumatosis, cannot exclude proctitis. 5. Numerous stable chronic findings as above. Electronically Signed   By: Delbert Phenix M.D.   On: 09/18/2015 19:07   Results for orders placed or performed during the hospital encounter of 08/27/15 (from the past 72 hour(s))  Glucose, capillary     Status: Abnormal   Collection Time: 09/17/15 12:05 PM  Result Value Ref Range   Glucose-Capillary 128 (H) 65 - 99 mg/dL  Glucose, capillary     Status: Abnormal   Collection Time: 09/17/15  4:47 PM  Result Value Ref Range   Glucose-Capillary 100 (H) 65 - 99 mg/dL  Glucose, capillary     Status: Abnormal   Collection Time: 09/17/15  9:07 PM  Result Value Ref Range   Glucose-Capillary  105 (H) 65 - 99 mg/dL   Comment 1 Notify RN   Protime-INR     Status: Abnormal   Collection Time: 09/18/15  4:37 AM  Result Value Ref Range   Prothrombin Time 25.6 (H) 11.6 - 15.2 seconds   INR 2.36 (H) 0.00 - 1.49  Glucose, capillary     Status: None   Collection Time: 09/18/15  4:37 AM  Result Value Ref Range   Glucose-Capillary 88 65 - 99 mg/dL   Comment 1 Notify RN   Glucose, capillary     Status: None   Collection Time: 09/18/15  6:58 AM  Result Value Ref Range   Glucose-Capillary 93 65 - 99 mg/dL   Comment 1 Notify RN   Glucose, capillary     Status: None   Collection Time: 09/18/15 11:38 AM  Result Value Ref Range   Glucose-Capillary 99 65 - 99 mg/dL   Comment 1 Notify RN   Glucose, capillary     Status: None   Collection Time: 09/18/15  4:30 PM  Result Value Ref Range  Glucose-Capillary 88 65 - 99 mg/dL   Comment 1 Notify RN   Glucose, capillary     Status: Abnormal   Collection Time: 09/18/15  8:56 PM  Result Value Ref Range   Glucose-Capillary 116 (H) 65 - 99 mg/dL  Protime-INR     Status: Abnormal   Collection Time: 09/19/15  4:45 AM  Result Value Ref Range   Prothrombin Time 24.8 (H) 11.6 - 15.2 seconds   INR 2.27 (H) 0.00 - 1.49  Glucose, capillary     Status: None   Collection Time: 09/19/15  7:12 AM  Result Value Ref Range   Glucose-Capillary 88 65 - 99 mg/dL  Glucose, capillary     Status: Abnormal   Collection Time: 09/19/15 11:26 AM  Result Value Ref Range   Glucose-Capillary 110 (H) 65 - 99 mg/dL   Comment 1 Notify RN   Glucose, capillary     Status: None   Collection Time: 09/19/15  4:38 PM  Result Value Ref Range   Glucose-Capillary 99 65 - 99 mg/dL   Comment 1 Notify RN   Glucose, capillary     Status: Abnormal   Collection Time: 09/19/15  8:56 PM  Result Value Ref Range   Glucose-Capillary 105 (H) 65 - 99 mg/dL  Protime-INR     Status: Abnormal   Collection Time: 09/20/15  4:37 AM  Result Value Ref Range   Prothrombin Time 26.5 (H) 11.6  - 15.2 seconds   INR 2.48 (H) 0.00 - 1.49  Glucose, capillary     Status: None   Collection Time: 09/20/15  6:50 AM  Result Value Ref Range   Glucose-Capillary 81 65 - 99 mg/dL    Nursing note and vitals reviewed. Constitutional: He is oriented to person, place, and time.he is slower today He appears well-developed and well-nourished. No distress.  HENT:  Head: Normocephalic and atraumatic.  Eyes: Conjunctivae and EOM are normal.   Neck: negative  healing trach site Cardiovascular: Normal rate and regular rhythm. Slight resting tachy Respiratory: Effort normal and breath sounds normal. No respiratory distress. He has no wheezes.    GI: Soft. Bowel sounds are normal.  Non-tender. Midline incision has healed . Ostomy with bilious drainage which appears decreased. Multiple drains LLQ sutured in place.no erythema  Genitourinary:  Foley in place.  Musculoskeletal: He exhibits edema b/l LE   Skin:  +2 edema  Right lower shin with stasis changes.   Neurological: He is alert and oriented to person, place, and time.      Sensation diminished in feet.   Assessment:   1. 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    2. LLE DVT treatment /Anticoagulation: Pharmaceutical: Coumadin---therapeutic  3. Pain Management: Tylenol for prn for pain. Will continue oxycodone prn if needed for severe pain.Knee OA mod severe, bracing and voltaren gel  -would benefit from scheduled oxy prior to am therapies---will schedule one hour ahead  4. Skin/Wound Care:   sacral wound vac placed, If excess rectal stump drainage, may need to DC wound VAC and placed wet to dry dressing  - PRAFO ordered for heels, Cont air bed   -reviewed concept of serous drainage with patient  5. Abdominal abscess: Continue drain care bid.   ID on consult, Pt refused contrast has some  improvement of at least one abscess with R drain removed, Per infectious disease okay to discontinue antibiotics and monitor 6. Hypokalemia:  KCl supplements.  Recheck  Bmet in am.                        7. Diastolic CHF: Off amiodarone, Ace and statin at this time due to recurrent hypotension and multiple medical issues. Monitor for signs of overload. Weights stable.   LOS (Days) 24 A FACE TO FACE EVALUATION WAS PERFORMED Rogelia BogaKWIATKOWSKI,PETER FRANK 09/20/2015, 7:54 AM

## 2015-09-20 NOTE — Progress Notes (Signed)
ANTICOAGULATION CONSULT NOTE - Follow Up Consult  Pharmacy Consult for coumadin Indication: DVT  Allergies  Allergen Reactions  . Unasyn [Ampicillin-Sulbactam Sodium] Hives, Itching and Rash    Diffuse rash not responding to Pepcid and Benadryl.  TDD.  Marland Kitchen. Levaquin [Levofloxacin] Nausea Only  . Penicillins Hives  . Doxycycline Rash    Patient Measurements: Height: 5\' 9"  (175.3 cm) Weight: 238 lb 15.7 oz (108.4 kg) IBW/kg (Calculated) : 70.7  Vital Signs: Temp: 97.7 F (36.5 C) (11/06 0608) Temp Source: Oral (11/06 0608) BP: 98/60 mmHg (11/06 0608) Pulse Rate: 95 (11/06 0608)  Labs:  Recent Labs  09/18/15 0437 09/19/15 0445 09/20/15 0437  LABPROT 25.6* 24.8* 26.5*  INR 2.36* 2.27* 2.48*    Estimated Creatinine Clearance: 104.3 mL/min (by C-G formula based on Cr of 0.3).   Assessment: 69 yo M admitted 08/27/2015 with recent LLE DVT. INR remains therapeutic, 2.48. Patient noted increase in rectal drainage. No new CBC since 10/31, CBC ordered for tomorrow AM.  Goal of Therapy:  INR 2-3 Monitor platelets by anticoagulation protocol: Yes   Plan:  - Warfarin 5mg  daily - Monitor daily INR, CBC, s/sx of bleeding  Casilda Carlsaylor Jalyne Brodzinski, PharmD. Clinical Pharmacist Resident Pager: [phone] 09/20/2015,11:27 AM

## 2015-09-20 NOTE — Progress Notes (Signed)
Pt has home CPAP at bedside. He states he does not need further assistance. RT will continue to monitor

## 2015-09-20 NOTE — Progress Notes (Signed)
Occupational Therapy Session Note  Patient Details  Name: Jonathan Le MRN: [id number] Date of Birth: July 06, 1946  Today's Date: 09/20/2015 OT Individual Time:  -    1500-1600  (60 min)      Short Term Goals: Week 1:  OT Short Term Goal 1 (Week 1): Pt will complete bathing with mod assist seated at EOB with assist of 1 caregiver OT Short Term Goal 1 - Progress (Week 1): Not met OT Short Term Goal 2 (Week 1): Pt will complete UB dressing with mod assist and min cues for problem solving/organization OT Short Term Goal 2 - Progress (Week 1): Met OT Short Term Goal 3 (Week 1): Pt will complete LB dressing at bed level with max assist OT Short Term Goal 3 - Progress (Week 1): Not met OT Short Term Goal 4 (Week 1): Pt will complete sit > stand from EOB with +2 assist to complete 1 grooming task OT Short Term Goal 4 - Progress (Week 1): Not met Week 2:  OT Short Term Goal 1 (Week 2): Pt will complete LB dressing at bed level with max assist OT Short Term Goal 1 - Progress (Week 2): Met OT Short Term Goal 2 (Week 2): Pt will donn pullover shirt EOB with close supervision. OT Short Term Goal 2 - Progress (Week 2): Not met OT Short Term Goal 3 (Week 2): Pt will perform UB bathing sitting unsupported EOB with supervision, including reaching both upper shoulders.  OT Short Term Goal 3 - Progress (Week 2): Met OT Short Term Goal 4 (Week 2): Pt will transition sidelying to sitting in order to complete seltcare tasks with mod assist 2 consecutive sessions OT Short Term Goal 4 - Progress (Week 2): Not met OT Short Term Goal 5 (Week 2): Pt will perform sliding board transfer from bed to wheelchair with max assist. OT Short Term Goal 5 - Progress (Week 2): Not met Week 3:  OT Short Term Goal 1 (Week 3): Pt will donn pullover shirt EOB with close supervision. OT Short Term Goal 2 (Week 3): Pt will transition sidelying to sitting in order to complete seltcare tasks with mod assist 2 consecutive  sessions OT Short Term Goal 3 (Week 3): Pt will perform sliding board transfer from bed to wheelchair with max assist. OT Short Term Goal 4 (Week 3): Pt will roll to the left with min assist and to the right with supervision during LB selfcare tasks.   Skilled Therapeutic Interventions/Progress Updates:    Pt, lying in bed upon OT arrival.  Pt doffed and donned shirt with min assist with HOB at 45 degrees.  Rolled to right to come to EOB in sitting.  Sat EOB with close supervision to [employer] with reacher.  Pt did both legs.  Used stedy to go from sit to stand and OT pulled up pants.  Transferred to wc.  Propelled pt around unit.  Took pt back to room and left in wc with safety belt on and all needs in place.    Therapy Documentation Precautions:  Precautions Precautions: Fall Precaution Comments: sacral decub, trach, drain lines from abdomen Restrictions Weight Bearing Restrictions: No Other Position/Activity Restrictions: trach collar      Pain:  3/10          See Function Navigator for Current Functional Status.   Therapy/Group: Individual Therapy  Lisa Roca 09/20/2015, 3:04 PM

## 2015-09-21 ENCOUNTER — Inpatient Hospital Stay (HOSPITAL_COMMUNITY): Payer: Medicare Other | Admitting: Occupational Therapy

## 2015-09-21 ENCOUNTER — Encounter (HOSPITAL_COMMUNITY): Payer: Medicare Other

## 2015-09-21 ENCOUNTER — Inpatient Hospital Stay (HOSPITAL_COMMUNITY): Payer: Medicare Other | Admitting: Physical Therapy

## 2015-09-21 LAB — BASIC METABOLIC PANEL
Anion gap: 6 (ref 5–15)
BUN: 10 mg/dL (ref 6–20)
CO2: 26 mmol/L (ref 22–32)
CREATININE: 0.39 mg/dL — AB (ref 0.61–1.24)
Calcium: 8.3 mg/dL — ABNORMAL LOW (ref 8.9–10.3)
Chloride: 103 mmol/L (ref 101–111)
GFR calc Af Amer: >60 mL/min (ref >60)
GFR calc non Af Amer: >60 mL/min (ref >60)
Glucose, Bld: 93 mg/dL (ref 65–99)
Potassium: 4.1 mmol/L (ref 3.5–5.1)
SODIUM: 135 mmol/L (ref 135–145)

## 2015-09-21 LAB — CBC
HCT: 29.4 % — ABNORMAL LOW (ref 39.0–52.0)
Hemoglobin: 8.9 g/dL — ABNORMAL LOW (ref 13.0–17.0)
MCH: 27.1 pg (ref 26.0–34.0)
MCHC: 30.3 g/dL (ref 30.0–36.0)
MCV: 89.4 fL (ref 78.0–100.0)
PLATELETS: 277 10*3/uL (ref 150–400)
RBC: 3.29 MIL/uL — ABNORMAL LOW (ref 4.22–5.81)
RDW: 15.7 % — AB (ref 11.5–15.5)
WBC: 5.8 10*3/uL (ref 4.0–10.5)

## 2015-09-21 LAB — GLUCOSE, CAPILLARY
Glucose-Capillary: 83 mg/dL (ref 65–99)
Glucose-Capillary: 98 mg/dL (ref 65–99)
Glucose-Capillary: 92 mg/dL (ref 65–99)
Glucose-Capillary: 113 mg/dL — ABNORMAL HIGH (ref 65–99)

## 2015-09-21 LAB — PROTIME-INR
INR: 2.46 — ABNORMAL HIGH (ref 0.00–1.49)
Prothrombin Time: 26.4 seconds — ABNORMAL HIGH (ref 11.6–15.2)

## 2015-09-21 MED ORDER — SUCRALFATE 1 GM/10ML PO SUSP
1.0000 g | Freq: Every day | ORAL | Status: AC
  Administered 2015-09-21 – 2015-09-25 (×5): 1 g via RECTAL
  Filled 2015-09-21 (×5): qty 10

## 2015-09-21 NOTE — Progress Notes (Signed)
Referring Physician(s): Derrell Lolling  Chief Complaint:  Intraabdominal abscesses  Subjective:  B lower abd abscess drains placed 07/21/15 2 additional L drains placed 9/29 R drain removed 09/01/15 Most recent CT: 11/4:  IMPRESSION: 1. Interval removal of right percutaneous drainage catheter, with residual small anterior right lower quadrant fluid collection, decreased. 2. Tiny residual abscess in the lateral left abdominal muscle wall, decreased. Persistent inflammatory changes in the lateral left abdominal muscle wall. No new fluid collections. 3. 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 4. New nonspecific wall thickening in the rectal remnant without pneumatosis, cannot exclude proctitis. 5. Numerous stable chronic findings as above.  Output has diminished over weekend for all 3 drains 10- 20 cc each (flushing 20 cc daily each) afeb Wbc wnl No antibx Can remove per Dr Loreta Ave  Allergies: Unasyn; Levaquin; Penicillins; and Doxycycline  Medications: Prior to Admission medications   Medication Sig Start Date End Date Taking? Authorizing Provider  Amino Acids-Protein Hydrolys (FEEDING SUPPLEMENT, PRO-STAT SUGAR FREE 64,) LIQD Place 30 mLs into feeding tube 5 (five) times daily. 07/25/15  Yes Jeanella Craze, NP  white petrolatum (VASELINE) GEL Apply 1 application topically daily. Apply to L nare wound daily 07/08/15  Yes Jeanella Craze, NP     Vital Signs: BP 106/58 mmHg  Pulse 97  Temp(Src) 98.1 F (36.7 C) (Oral)  Resp 18  Ht  (1.753 m)  Wt 236 lb 12.4 oz (107.4 kg)  BMI 34.95 kg/m2  SpO2 98%  Physical Exam  Abdominal: Soft.  Sites for all 3 drains Clean and dry No bleeding NT no sign of infection Output all drains less than 20 cc/day X 3 days now afeb Wbc wnl   Musculoskeletal: Normal range of motion.  Neurological: He is alert.  Skin: Skin is warm.  Nursing note and  vitals reviewed.   Imaging: Ct Abdomen Pelvis W Contrast  09/18/2015  CLINICAL DATA:  Follow-up abdominal abscess. Status post colectomy for ischemic colitis. EXAM: CT ABDOMEN AND PELVIS WITH CONTRAST TECHNIQUE: Multidetector CT imaging of the abdomen and pelvis was performed using the standard protocol following bolus administration of intravenous contrast. CONTRAST:  100 cc Omnipaque 300 IV. COMPARISON:  08/31/2015 CT abdomen/pelvis. FINDINGS: Lower chest: Lingular 4 mm pulmonary nodule (series 3/image 5), stable. Small left pleural, decreased. Passive segmental left lower lobe dependent atelectasis, slightly decreased. Right, left anterior descending and left circumflex coronary atherosclerotic calcifications. Hepatobiliary: Normal liver with no liver mass. Normal gallbladder with no radiopaque cholelithiasis. No biliary ductal dilatation. Pancreas: Normal, with no mass or duct dilation. Spleen: Normal size. No mass. Adrenals/Urinary Tract: Normal adrenals. Nonobstructing tiny right renal stones, largest 4 mm in the upper right kidney. Nonobstructing 4 mm lower left renal stone. No hydronephrosis. Stable 4 mm stone in the dependent right bladder. Stomach/Bowel: Grossly normal stomach. Normal caliber small bowel with no small bowel wall thickening. Oral contrast traverses to the right lower quadrant end ileostomy. Status post total colectomy. The remnant is mildly distended by fluid with new nonspecific rectal wall thickening without appreciable rectal wall pneumatosis or new perirectal fat stranding. Vascular/Lymphatic: Atherosclerotic nonaneurysmal abdominal aorta. Patent portal, splenic, hepatic and renal veins. Stable patent prominent splenorenal shunt. No pathologically enlarged lymph nodes in the abdomen or pelvis. Reproductive: Stable moderate prostatomegaly. Other: Interval removal of the right percutaneous drainage catheter, with residual 2.6 x 1.5 cm anterior right lower quadrant fluid collection  (series 8/image 30), decreased from 5.7 x  2.0 cm. Three percutaneous drainage catheter is in the lateral left abdominal wall are stable in position. There is a tiny 1.0 x 0.7 cm thick-walled fluid collection in the lateral left abdominal wall just inferior to the superior most drainage catheter (series 2/ image 51), decreased from 1.8 x 1.0 cm. There is persistent ill-defined fat stranding and enhancement throughout the lateral left abdominal wall with minimal scattered internal foci of gas. No new focal definable collection is noted. No free intraperitoneal gas. No ascites. Musculoskeletal: No aggressive appearing focal osseous lesions. Visualized lower sternotomy wires are intact. Severe degenerative changes in the lumbar spine. Stable small fat containing left inguinal hernia. IMPRESSION: 1. Interval removal of right percutaneous drainage catheter, with residual small anterior right lower quadrant fluid collection, decreased. 2. Tiny residual abscess in the lateral left abdominal muscle wall, decreased. Persistent inflammatory changes in the lateral left abdominal muscle wall. No new fluid collections. 3. 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 4. New nonspecific wall thickening in the rectal remnant without pneumatosis, cannot exclude proctitis. 5. Numerous stable chronic findings as above. Electronically Signed   By: Delbert PhenixJason A Poff M.D.   On: 09/18/2015 19:07    Labs:  CBC:  Recent Labs  08/28/15 0430 09/07/15 0433 09/14/15 0510 09/21/15 0601  WBC 6.6 6.3 6.4 5.8  HGB 8.2* 9.3* 9.4* 8.9*  HCT 26.9* 31.2* 29.4* 29.4*  PLT 349 311 303 277    COAGS:  Recent Labs  07/21/15 0815  07/23/15 1200 07/25/15 0419 07/30/15 0627  09/18/15 0437 09/19/15 0445 09/20/15 0437 09/21/15 0601  INR 1.65*  < > 2.62* 1.48 1.35  < > 2.36* 2.27* 2.48* 2.46*  APTT 29  --  38* 28 31  --   --   --   --   --   < > = values in this  interval not displayed.  BMP:  Recent Labs  09/07/15 0433 09/13/15 0515 09/14/15 0510 09/21/15 0601  NA 137 134* 138 135  K 3.5 3.6 3.4* 4.1  CL 103 99* 102 103  CO2 30 29 29 26   GLUCOSE 95 87 87 93  BUN 9 7 9 10   CALCIUM 8.1* 8.0* 8.1* 8.3*  CREATININE 0.37* 0.35* 0.30* 0.39*  GFRNONAA >60 >60 >60 >60  GFRAA >60 >60 >60 >60    LIVER FUNCTION TESTS:  Recent Labs  07/23/15 0600 07/24/15 0345 07/25/15 0419  07/28/15 0630 08/02/15 0620 08/16/15 0701 08/28/15 0430  BILITOT 1.0 0.7 0.9  --   --   --   --  0.6  AST 30 40 35  --   --   --   --  22  ALT 15* 15* 14*  --   --   --   --  10*  ALKPHOS 154* 167* 199*  --   --   --   --  80  PROT 4.6* 4.3* 4.5*  --   --   --   --  5.2*  ALBUMIN <1.0* <1.0* <1.0*  < > 1.4* 1.2* 1.2* 1.6*  < > = values in this interval not displayed.  Assessment and Plan:  Intraabdominal abscess drains CT 11/4 shows resolution Removal today per discussion with Dr Loreta AveWagner Removed without complication at bedside New dressings applied.  Signed: Makira Holleman A 09/21/2015, 10:25 AM   I spent a total of 15 Minutes at the the patient's bedside AND on the patient's hospital floor or unit, greater than 50% of  which was counseling/coordinating care for abscess drains

## 2015-09-21 NOTE — Progress Notes (Signed)
Physical Therapy Weekly Progress Note  Patient Details  Name: Jonathan Le MRN: [id number] Date of Birth: [birth year]  Beginning of progress report period: September 15, 2015 End of progress report period: September 21, 2015   Patient has met 1 of 4 short term goals.  Pt with wound vac sealing issues which prevented PT from using sliding board this past week.  Pt has improved LE strength and is able to perform sit to stand in the STEDY and is able to tolerate partial standing in the STEDY x 5 minutes.  Pt continues to need +2 support for standing and transfers, able to perform supine to sit from hospital bed with [facility] raised and railings with supervision.  Pt's discharge is planned to SNF or LTAC, so home goals were d/c'd.  Patient continues to demonstrate the following deficits: decreased strength, endurance, mobility and therefore will continue to benefit from skilled PT intervention to enhance overall performance with activity tolerance, balance and ability to compensate for deficits.  Patient not progressing toward long term goals.  See goal revision. Goals downgraded to max A to decrease burden of care at next venue of care.  Continue plan of care.  PT Short Term Goals Week 3:  PT Short Term Goal 1 (Week 3): pt will move laterally on bed in supine without use of rails, with 1 questioning cue PT Short Term Goal 1 - Progress (Week 3): Progressing toward goal PT Short Term Goal 2 (Week 3): pt will transfer w/c>< level mat with slideboard , with mod assist of 2 people, for stabilizaing w/c and assisting pt PT Short Term Goal 2 - Progress (Week 3): Discontinued (comment) (due to d/c of sliding board due to wound vac) PT Short Term Goal 3 (Week 3): pt will propel through cone obstacle course x 25' with min assist for steering PT Short Term Goal 3 - Progress (Week 3): Progressing toward goal PT Short Term Goal 4 (Week 3): pt will tolerate standing in [place] lift x 2 minutes PT Short Term Goal 4 -  Progress (Week 3): Met (in STEDY)  Skilled Therapeutic Interventions/Progress Updates:  Ambulation/gait training;Balance/vestibular training;Discharge planning;Disease management/prevention;DME/adaptive equipment instruction;Functional mobility training;Neuromuscular re-education;Patient/family education;Pain management;Psychosocial support;Stair training;Skin care/wound management;Splinting/orthotics;Therapeutic Activities;Therapeutic Exercise;UE/LE Coordination activities;UE/LE Strength taining/ROM;Wheelchair propulsion/positioning     See Function Navigator for Current Functional Status.   Angeldejesus Callaham 09/21/2015, 1:58 PM

## 2015-09-21 NOTE — Progress Notes (Signed)
Occupational Therapy Weekly Progress Note  Patient Details  Name: Jonathan Le MRN: [id number] Date of Birth: October 13, 1946  Beginning of progress report period: September 13, 2015 End of progress report period: September 21, 2015  Today's Date: 09/21/2015 OT Individual Time: 0037-0488 OT Individual Time Calculation (min): 57 min    Patient has met 3 of 4 short term goals.  Pt continues to make steady progress with OT.  He is performing UB selfcare in sitting with supervision and can wash all LB parts with supervision and AE except for his peri area, which needs max assist in supine position.  AE has been utilized for LB dressing tasks as well with return to supine in order to donn incontinence brief and pull shorts over hips.  He has been increasing his ability to tolerate standing and at this point he can stand with +2 assist in the Steady for up to 30 seconds.  Still with decreased ability to achieve full upright standing with hip and lumbar extension however.  Feel Jonathan Le is making good progress over the past couple weeks but based on his medical complications including a moderate sacral wound, feel he will need more months of therapy to reach a level that his wife can safely assist him at home.  Recommend transition to SNF rehab to continue longer rehab.  Patient continues to demonstrate the following deficits: decreased balance, decreased endurance. Increased pain, decreased BUE and BLE strength,  and therefore will continue to benefit from skilled OT intervention to enhance overall performance with BADL.  Patient progressing toward long term goals..  Plan of care revisions: goals downgraded.  OT Short Term Goals Week 4:  OT Short Term Goal 1 (Week 4): Pt will tolerate standing for 30 seconds with total assist +2 pt (30%) in order to increase endurance for LB selfcare OT Short Term Goal 2 (Week 4): Pt will transfer from supine to sit EOB with no more than min assist in preparation for  selfcare tasks. OT Short Term Goal 3 (Week 4): Pt will perform LB dressing sit to supine with mod assist including pulling pants over his hips. OT Short Term Goal 4 (Week 4): Pt will increase BUE shoulder AROM to greater than 110 degrees to increase independence and efficiency with UB dressing.    Skilled Therapeutic Interventions/Progress Updates:    Pt worked on standing and sit to stand transitions using the Steady lift.  Pt needed total assist +2 (pt 25%) for sit to stand on 5 trials.  First trial of standing lasted for 20 seconds.  Pt progressively fatigued with the next 4 attempts to stand ranging from 7 seconds to 3 seconds.  On the last attempt pt was unable to lift his bottom of of the wheelchair to stand.  Returned to his room via wheelchair.  Transferred to steady with +2 assist and pt just lifting high enough for therapist to slide the seat on the steady into place.  Transitioned to the bed and to supine with mod assist for lifting LEs into the bed.    Therapy Documentation Precautions:  Precautions Precautions: Fall Precaution Comments: sacral decub, trach, drain lines from abdomen Restrictions Weight Bearing Restrictions: No Other Position/Activity Restrictions: trach collar  Pain: Pain Assessment Pain Assessment: 0-10 Pain Score: 0-No pain ADL: See Function Navigator for Current Functional Status.   Therapy/Group: Individual Therapy  Jonathan Le OTR/L 09/21/2015, 4:04 PM

## 2015-09-21 NOTE — Consult Note (Signed)
11/4 WOC follow-up:Sacrum dressing was changed this AM when [facility] PA in to assess wound.  Moist gauze packing in place since unable to maintain a Vac seal due to leakage from buttocks, which surgical team is following for assessment and plan of care.  Wound beefy red, small amt tan drainage, no odor, 9X8X6cm with tunneling to 12:00 o'clock to 8 cm.   Stoma red and viable, slightly above skin level, 1 1/4 inches,  red macerated peristomal area from previous assessment has resolved with the use of a barrier ring.  Demonstrated pouch change using 2 piece pouching system and barrier ring to maintain seal. Discussed pouching routines, emptying, and ordering supplies, and dietary precautions. Pt asks appropriate questions and verbalizes understanding. Supplies at bedside for staff nurse use and educational materials left at bedside. Pt is able to open and close the velcro to empty. Recommend continued assistance with pouch application after discharge; either nursing facility or home health agency. Plan to perform another teaching session on Wed at 0800. Abd with previous wound which has healed, now dry intact scabbed scar tissue.  Will discontinue previous dressing change orders. Pt requested assessment of bilat heels, which I had never assessed or been requested to consult.  Previous wounds have evolved into pink moist stage 2 pressure injuries at this time; dry scabbed skin removes easily. Left heel 2X2X.1cm, right heel 4X4X.1cm.  Foam dressings to protect and promote healing and float heels to reduce pressure. Placed on [facility] discharge program: Yes Cammie Mcgeeawn Marquett Bertoli MSN, RN, Rober MinionCWOCN, [place]CN-AP, CNS [phone]

## 2015-09-21 NOTE — Progress Notes (Signed)
Occupational Therapy Session Note  Patient Details  Name: Jonathan Le MRN: [id number] Date of Birth: 05/18/1946  Today's Date: 09/21/2015 OT Individual Time: 1430-1500 OT Individual Time Calculation (min): 30 min    Short Term Goals: Week 3:  OT Short Term Goal 1 (Week 3): Pt will donn pullover shirt EOB with close supervision. OT Short Term Goal 2 (Week 3): Pt will transition sidelying to sitting in order to complete seltcare tasks with mod assist 2 consecutive sessions OT Short Term Goal 3 (Week 3): Pt will perform sliding board transfer from bed to wheelchair with max assist. OT Short Term Goal 4 (Week 3): Pt will roll to the left with min assist and to the right with supervision during LB selfcare tasks.   Skilled Therapeutic Interventions/Progress Updates:    Pt seen for OT therapy session focusing on UE strengthening exercises in prep for functional tasks and transfers. Pt in supine upon arrival with NTs present providing toileting assist/ hygiene. Pt completed UE strengthening exercises in supine utilizing 1.5lbs dumbbells. Exercises completed to all major UE strengthening exercises with verbal and tactile cues provided for proper form and technique and VCs for breathing technique. Education provided regarding purpose of exercises and functional implications.  Pt completed log rolling to L side with min A, able to direct care for assist. Pt left in side lying position as he had complaints of soreness on his bottom. RN made aware.  Therapy Documentation Precautions:  Precautions Precautions: Fall Precaution Comments: sacral decub, trach, drain lines from abdomen Restrictions Weight Bearing Restrictions: No Other Position/Activity Restrictions: trach collar Pain: Pain Assessment Pain Assessment: 0-10 Pain Score: Voiced some discomfort on bottom due to sores, however, no formal number given. Pt repositioned.   See Function Navigator for Current Functional  Status.   Therapy/Group: Individual Therapy  Lewis, Netta Fodge C 09/21/2015, 7:18 AM

## 2015-09-21 NOTE — Progress Notes (Signed)
ANTICOAGULATION CONSULT NOTE - Follow Up Consult  Pharmacy Consult for coumadin Indication: DVT  Allergies  Allergen Reactions  . Unasyn [Ampicillin-Sulbactam Sodium] Hives, Itching and Rash    Diffuse rash not responding to Pepcid and Benadryl.  TDD.  Marland Kitchen. Levaquin [Levofloxacin] Nausea Only  . Penicillins Hives  . Doxycycline Rash    Patient Measurements: Height: 5\' 9"  (175.3 cm) Weight: 236 lb 12.4 oz (107.4 kg) IBW/kg (Calculated) : 70.7 Heparin Dosing Weight:   Vital Signs: Temp: 98.1 F (36.7 C) (11/07 0500) Temp Source: Oral (11/07 0500) BP: 106/58 mmHg (11/07 0500) Pulse Rate: 97 (11/07 0500)  Labs:  Recent Labs  09/19/15 0445 09/20/15 0437 09/21/15 0601  HGB  --   --  8.9*  HCT  --   --  29.4*  PLT  --   --  277  LABPROT 24.8* 26.5* 26.4*  INR 2.27* 2.48* 2.46*  CREATININE  --   --  0.39*    Estimated Creatinine Clearance: 103.8 mL/min (by C-G formula based on Cr of 0.39).   Medications:  Scheduled:  . acetaminophen  650 mg Oral BID WC  . antiseptic oral rinse  15 mL Mouth Rinse TID PC  . diclofenac sodium  2 g Topical QID  . famotidine  20 mg Oral BID  . feeding supplement (PRO-STAT SUGAR FREE 64)  30 mL Oral TID  . finasteride  5 mg Oral Daily  . furosemide  20 mg Oral Q24H  . furosemide  40 mg Oral Daily  . hydrocerin   Topical BID  . insulin aspart  0-5 Units Subcutaneous QHS  . insulin aspart  0-9 Units Subcutaneous TID WC  . magnesium oxide  800 mg Oral BID  . multivitamin with minerals  1 tablet Oral Daily  . MUSCLE RUB   Topical TID AC  . oxyCODONE  2.5 mg Oral Q0600  . potassium chloride  40 mEq Oral BID  . sodium chloride  10 mL Intravenous Q12H  . sucralfate  1 g Rectal Daily  . tamsulosin  0.4 mg Oral QPC supper  . vitamin C  500 mg Oral BID  . warfarin  5 mg Oral q1800  . Warfarin - Pharmacist Dosing Inpatient   Does not apply q1800   Infusions:    Assessment: [age] male with DVT is currently on therapeutic coumadin.  INR today  is 2.46.  Goal of Therapy:  INR 2-3 Monitor platelets by anticoagulation protocol: Yes   Plan:  - continue coumadin 5 mg po daily - change INR to MWF  Pope Brunty, Tsz-Yin 09/21/2015,8:25 AM

## 2015-09-21 NOTE — Progress Notes (Signed)
Social Work Patient ID: Jonathan Le, male   DOB: 06/19/1946, 69 y.o.   MRN: [id number] Spoke with wife via telephone when trying to discuss discharge plans and next step of NHP.  She stated: " I told you he was staying here." When this worker tried to discuss this was not possible she wanted to speak with my manager and this worker offered for her to speak with risk management and she  Refused.  She stated: " I know who I need to talk too thank you." Then promptly hung up the phone. Pt is aware he is not able to continue to stay here and is agreeable to  Looking for a NH bed. Since pt is capable of making his own decisions this worker will go forth and pursue NHP.

## 2015-09-21 NOTE — Progress Notes (Signed)
Nutrition Follow-up  DOCUMENTATION CODES:   Obesity unspecified  INTERVENTION:  Continue 30 ml Prostat po TID, each supplement provides 100 kcal and 15 grams of protein.   Encourage adequate PO intake.   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 He required total colectomy with ileostomy Abdominal incision closing in well and multiple ulcers--bilateral heels, left thigh, and trach sites clean and dry. Stage III Sacrococcygeal wound -debrided at bedside on 10/05 and currently reported to be 15 X 15 with 1.8 cm deep . Pt decannulated.   Intraabdominal abscess drains removed today. Meal completion has been 100%. Pt has been consuming his Prostats. RD to continue with current orders. Continue to encourage adequate PO intake.   Labs and medications reviewed.   Diet Order:  Diet regular Room service appropriate?: Yes; Fluid consistency:: Thin; Fluid restriction:: 1500 mL Fluid  Skin:  Wound (see comment) (Stage II on heels, Stage 4 on buttocks)  Last BM:  11/7 ileostomy  Height:   Ht Readings from Last 1 Encounters:  08/27/15 _0  (1.753 m)    Weight:   Wt Readings from Last 1 Encounters:  09/21/15 236 lb 12.4 oz (107.4 kg)    Ideal Body Weight:  72.7 kg  BMI:  Body mass index is 34.95 kg/(m^2).  Estimated Nutritional Needs:   Kcal:  2100-2300  Protein:  135-150 grams  Fluid:  2.1 - 2.3 L/day  EDUCATION NEEDS:    No education needs identified at this time  Corrin Parker, MS, RD, LDN Pager # [phone] After hours/ weekend pager # [phone]

## 2015-09-21 NOTE — Progress Notes (Signed)
Subjective/Complaints: Discussed patient with Gen. Surgery. No need for repeat wound VAC at the current time. Planning olive oil enema for rectal stump drainage                                                                ROS:   Denies CP, SOB, n/v/d. Strength improving. No insomnia, anxiety, appetite ok, pain controlled  Objective: Vital Signs: Blood pressure 106/58, pulse 97, temperature 98.1 F (36.7 C), temperature source Oral, resp. rate 18, height 5' 9" (1.753 m), weight 107.4 kg (236 lb 12.4 oz), SpO2 98 %. No results found. Results for orders placed or performed during the hospital encounter of 08/27/15 (from the past 72 hour(s))  Glucose, capillary     Status: None   Collection Time: 09/18/15 11:38 AM  Result Value Ref Range   Glucose-Capillary 99 65 - 99 mg/dL   Comment 1 Notify RN   Glucose, capillary     Status: None   Collection Time: 09/18/15  4:30 PM  Result Value Ref Range   Glucose-Capillary 88 65 - 99 mg/dL   Comment 1 Notify RN   Glucose, capillary     Status: Abnormal   Collection Time: 09/18/15  8:56 PM  Result Value Ref Range   Glucose-Capillary 116 (H) 65 - 99 mg/dL  Protime-INR     Status: Abnormal   Collection Time: 09/19/15  4:45 AM  Result Value Ref Range   Prothrombin Time 24.8 (H) 11.6 - 15.2 seconds   INR 2.27 (H) 0.00 - 1.49  Glucose, capillary     Status: None   Collection Time: 09/19/15  7:12 AM  Result Value Ref Range   Glucose-Capillary 88 65 - 99 mg/dL  Glucose, capillary     Status: Abnormal   Collection Time: 09/19/15 11:26 AM  Result Value Ref Range   Glucose-Capillary 110 (H) 65 - 99 mg/dL   Comment 1 Notify RN   Glucose, capillary     Status: None   Collection Time: 09/19/15  4:38 PM  Result Value Ref Range   Glucose-Capillary 99 65 - 99 mg/dL   Comment 1 Notify RN   Glucose, capillary     Status: Abnormal   Collection Time: 09/19/15  8:56 PM  Result Value Ref Range   Glucose-Capillary 105 (H) 65 - 99 mg/dL  Protime-INR      Status: Abnormal   Collection Time: 09/20/15  4:37 AM  Result Value Ref Range   Prothrombin Time 26.5 (H) 11.6 - 15.2 seconds   INR 2.48 (H) 0.00 - 1.49  Glucose, capillary     Status: None   Collection Time: 09/20/15  6:50 AM  Result Value Ref Range   Glucose-Capillary 81 65 - 99 mg/dL  Glucose, capillary     Status: Abnormal   Collection Time: 09/20/15 11:20 AM  Result Value Ref Range   Glucose-Capillary 104 (H) 65 - 99 mg/dL  Glucose, capillary     Status: Abnormal   Collection Time: 09/20/15  4:24 PM  Result Value Ref Range   Glucose-Capillary 105 (H) 65 - 99 mg/dL  Glucose, capillary     Status: None   Collection Time: 09/20/15  9:17 PM  Result Value Ref Range   Glucose-Capillary 84 65 - 99 mg/dL   Comment  1 Notify RN   Protime-INR     Status: Abnormal   Collection Time: 09/21/15  6:01 AM  Result Value Ref Range   Prothrombin Time 26.4 (H) 11.6 - 15.2 seconds   INR 2.46 (H) 0.00 - 1.27  Basic metabolic panel     Status: Abnormal   Collection Time: 09/21/15  6:01 AM  Result Value Ref Range   Sodium 135 135 - 145 mmol/L   Potassium 4.1 3.5 - 5.1 mmol/L   Chloride 103 101 - 111 mmol/L   CO2 26 22 - 32 mmol/L   Glucose, Bld 93 65 - 99 mg/dL   BUN 10 6 - 20 mg/dL   Creatinine, Ser 0.39 (L) 0.61 - 1.24 mg/dL   Calcium 8.3 (L) 8.9 - 10.3 mg/dL   GFR calc non Af Amer >60 >60 mL/min   GFR calc Af Amer >60 >60 mL/min    Comment: (NOTE) The eGFR has been calculated using the CKD EPI equation. This calculation has not been validated in all clinical situations. eGFR's persistently <60 mL/min signify possible Chronic Kidney Disease.    Anion gap 6 5 - 15  CBC     Status: Abnormal   Collection Time: 09/21/15  6:01 AM  Result Value Ref Range   WBC 5.8 4.0 - 10.5 K/uL   RBC 3.29 (L) 4.22 - 5.81 MIL/uL   Hemoglobin 8.9 (L) 13.0 - 17.0 g/dL   HCT 29.4 (L) 39.0 - 52.0 %   MCV 89.4 78.0 - 100.0 fL   MCH 27.1 26.0 - 34.0 pg   MCHC 30.3 30.0 - 36.0 g/dL   RDW 15.7 (H) 11.5 -  15.5 %   Platelets 277 150 - 400 K/uL  Glucose, capillary     Status: None   Collection Time: 09/21/15  6:33 AM  Result Value Ref Range   Glucose-Capillary 83 65 - 99 mg/dL    Nursing note and vitals reviewed. Constitutional: He is oriented to person, place, and time.he is slower today He appears well-developed and well-nourished. No distress.  HENT:  Head: Normocephalic and atraumatic.  Eyes: Conjunctivae and EOM are normal.   Neck: Decreased range of motion present.  Coughs without evidence of air leakage through healing trach site Cardiovascular: Normal rate and regular rhythm. noES Respiratory: Effort normal and breath sounds normal. No respiratory distress. He has no wheezes.    GI: Soft. Bowel sounds are normal.  Non-tender. Midline incision has healed . Ostomy with bilious drainage which appears decreased. Multiple drains LLQ sutured in place.no erythema  Genitourinary:  Foley in place.  Musculoskeletal: He exhibits edema b/l LE  Right knee with effusion. No tenderness.  Skin:  Right lower shin with stasis changes.  Diffuse macular rash Neurological: He is alert and oriented to person, place, and time.    Speech clear. Follows basic commands without difficulty.  Psychiatric: He has a normal mood and affect. His speech is normal and behavior is normal. Cognition and memory are normal.  Strength Antigravity through, except b/l hip flexors, Patient needs some assist to initiate hip flexion.   Sensation diminished in feet.    Assessment/Plan: 1. 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 Physiatrist is providing close team supervision and 24 hour management of active medical problems listed below. Physiatrist and rehab team continue to assess barriers to discharge/monitor patient progress  toward functional and medical  goals.   FIM: Function - Bathing Position: Sitting EOB Body parts bathed by patient: Right arm, Left arm, Chest, Front perineal area, Abdomen, Left upper leg, Right upper leg Body parts bathed by helper: Back, Buttocks Bathing not applicable: Right lower leg, Left lower leg Assist Level: Assistive device Assistive Device Comment: LH sponge and reacher  Function- Upper Body Dressing/Undressing What is the patient wearing?: Pull over shirt/dress Pull over shirt/dress - Perfomed by patient: Thread/unthread right sleeve, Thread/unthread left sleeve, Put head through opening Pull over shirt/dress - Perfomed by helper: Pull shirt over trunk Assist Level: Supervision or verbal cues Function - Lower Body Dressing/Undressing What is the patient wearing?: Pants, Non-skid slipper socks Position: Sitting EOB Underwear - Performed by helper: Thread/unthread right underwear leg, Thread/unthread left underwear leg, Pull underwear up/down Pants- Performed by patient: Thread/unthread right pants leg, Thread/unthread left pants leg Pants- Performed by helper: Pull pants up/down Non-skid slipper socks- Performed by patient: Don/doff right sock (doff only) Non-skid slipper socks- Performed by helper: Don/doff right sock, Don/doff left sock Socks - Performed by helper: Don/doff right sock, Don/doff left sock Assist for footwear: Dependant Assist for lower body dressing: Touching or steadying assistance (Pt > 75%)  Function - Toileting Toileting activity did not occur: Safety/medical concerns Assist level: Two helpers (per Willard, NT report)  Function - Air cabin crew transfer activity did not occur: Safety/medical concerns  Function - Chair/bed transfer Chair/bed transfer activity did not occur: Safety/medical concerns Chair/bed transfer method: Lateral scoot Chair/bed transfer assist level: 2 helpers Chair/bed transfer assistive device: Mechanical  lift Mechanical lift: Clarise Cruz Chair/bed transfer details: Manual facilitation for weight shifting, Verbal cues for safe use of DME/AE, Verbal cues for technique, Manual facilitation for placement  Function - Locomotion: Wheelchair Will patient use wheelchair at discharge?: Yes Type: Manual Wheelchair activity did not occur: Safety/medical concerns Max wheelchair distance: 75 Assist Level: Supervision or verbal cues Wheel 50 feet with 2 turns activity did not occur: Safety/medical concerns Assist Level: Supervision or verbal cues Wheel 150 feet activity did not occur: Safety/medical concerns Turns around,maneuvers to table,bed, and toilet,negotiates 3% grade,maneuvers on rugs and over doorsills: No Function - Locomotion: Ambulation Ambulation activity did not occur: Safety/medical concerns Walk 10 feet activity did not occur: Safety/medical concerns Walk 50 feet with 2 turns activity did not occur: Safety/medical concerns Walk 150 feet activity did not occur: Safety/medical concerns Walk 10 feet on uneven surfaces activity did not occur: Safety/medical concerns  Function - Comprehension Comprehension: Auditory Comprehension assist level: Understands complex 90% of the time/cues 10% of the time  Function - Expression Expression: Verbal Expression assistive device: Talk trach valve Expression assist level: Expresses complex 90% of the time/cues < 10% of the time  Function - Social Interaction Social Interaction assist level: Interacts appropriately 90% of the time - Needs monitoring or encouragement for participation or interaction.  Function - Problem Solving Problem solving assist level: Solves basic 90% of the time/requires cueing < 10% of the time  Function - Memory Memory assist level: Recognizes or recalls 90% of the time/requires cueing < 10% of the time Patient normally able to recall (first 3 days only): Current season, Location of own room, Staff names and faces, That he or  she is in a hospital  1. 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    2. LLE DVT treatment /Anticoagulation: Pharmaceutical: Coumadin---therapeutic  , pharmacy protocol 3. Pain Management: Tylenol  for prn for pain. Will continue oxycodone prn if needed for severe pain.Knee OA mod severe, bracing and voltaren gel, KO helping  -would benefit from scheduled oxy prior to am therapies---will schedule one hour ahead 4. Mood: LCSW to follow for evaluation and support.  5. Neuropsych: This patient is capable of making decisions on his own behalf. 6. Skin/Wound Care:  wet to dry dressing  -  Cont air bed   - 7. Fluids/Electrolytes/Nutrition: Monitor I/O. improving   - Pt eating 100% of meals.  8. Abdominal abscess: Continue drain care bid.   diminishing per CT abd as well as catheter output, Ask IR to eval for removal 9. Hypokalemia: KCl supplements 10. VDRF/ History of OSA: Decannulated, no complications                      11. Anxiety disorder: resumed low dose Xanax as used this for insomnia at home. given his respiratory compromise will need to limit dosing.  Improving.  12. Anemia: Likely due to infection/chronic illness. Hgb stable at 8.9 on 11 7.  Will cont to monitor.                      13. Anasarca: Improved Continue Lasix, protein supplements.  14. Diastolic CHF: Off amiodarone, Ace and statin at this time due to recurrent hypotension and multiple medical issues. Monitor for signs of overload. Weights stable.  15.  Hypomagnesemia-.Resolved with supplementation      LOS (Days) 25 A FACE TO FACE EVALUATION WAS PERFORMED KIRSTEINS,ANDREW E 09/21/2015, 8:21 AM

## 2015-09-21 NOTE — Progress Notes (Signed)
Occupational Therapy Session Note  Patient Details  Name: Jonathan Le MRN: [id number] Date of Birth: 05/02/1946  Today's Date: 09/21/2015 OT Individual Time: 0900-1000 OT Individual Time Calculation (min): 60 min    Short Term Goals: Week 3:  OT Short Term Goal 1 (Week 3): Pt will donn pullover shirt EOB with close supervision. OT Short Term Goal 2 (Week 3): Pt will transition sidelying to sitting in order to complete seltcare tasks with mod assist 2 consecutive sessions OT Short Term Goal 3 (Week 3): Pt will perform sliding board transfer from bed to wheelchair with max assist. OT Short Term Goal 4 (Week 3): Pt will roll to the left with min assist and to the right with supervision during LB selfcare tasks.   Skilled Therapeutic Interventions/Progress Updates:   Pt performed bathing and dressing during session sit to supine.  AE used for doffing socks and donning shorts over feet.  Used LH sponge for washing feet.  Returned to supine for washing peri area as attempted to stand X2 with Steady however pt only able to move buttocks off of the bed and hold for less than 2-3 seconds for each interval.  Pt also with incontinence of mucal discharge from rectum with sit to stand exertion.  Nursing notified to re-dress wound while in sidelying position.  Pt left in bed with call button and phone within reach.     Therapy Documentation Precautions:  Precautions Precautions: Fall Precaution Comments: sacral decub, trach, drain lines from abdomen Restrictions Weight Bearing Restrictions: No Other Position/Activity Restrictions: trach collar  Pain: Pain Assessment Pain Assessment: 0-10 Pain Score: 0-No pain Faces Pain Scale: Hurts a little bit Pain Type: Acute pain Pain Location: Buttocks Pain Orientation: Lower Pain Intervention(s): Repositioned;Emotional support ADL: See Function Navigator for Current Functional Status.   Therapy/Group: Individual Therapy  Maygen Sirico  OTR/L 09/21/2015, 12:33 PM

## 2015-09-21 NOTE — Progress Notes (Signed)
Patient ID: Jonathan Le, male   DOB: 19-Dec-1945, 69 y.o.   MRN: [id number]    Subjective: Pt feels ok.  VAC got removed.  Can't feel drainage from his rectum  Objective: Vital signs in last 24 hours: Temp:  [98.1 F (36.7 C)] 98.1 F (36.7 C) (11/07 0500) Pulse Rate:  [97-100] 97 (11/07 0500) Resp:  [18] 18 (11/07 0500) BP: (92-106)/(53-58) 106/58 mmHg (11/07 0500) SpO2:  [96 %-98 %] 98 % (11/07 0500) Weight:  [107.4 kg (236 lb 12.4 oz)] 107.4 kg (236 lb 12.4 oz) (11/07 0500) Last BM Date: 09/20/15  Intake/Output from previous day: 11/06 0701 - 11/07 0700 In: 750 [P.O.:720] Out: 1460 [Urine:845; Drains:15; Stool:600] Intake/Output this shift:    PE: Skin: wound is overall mostly clean.  Still with some fibrin at the deep base as before. This is right on top of his sacrum.  Some mucous type drainage noted around his rectum  Lab Results:   Recent Labs  09/21/15 0601  WBC 5.8  HGB 8.9*  HCT 29.4*  PLT 277   BMET  Recent Labs  09/21/15 0601  NA 135  K 4.1  CL 103  CO2 26  GLUCOSE 93  BUN 10  CREATININE 0.39*  CALCIUM 8.3*   PT/INR  Recent Labs  09/20/15 0437 09/21/15 0601  LABPROT 26.5* 26.4*  INR 2.48* 2.46*   CMP     Component Value Date/Time   NA 135 09/21/2015 0601   K 4.1 09/21/2015 0601   CL 103 09/21/2015 0601   CO2 26 09/21/2015 0601   GLUCOSE 93 09/21/2015 0601   BUN 10 09/21/2015 0601   CREATININE 0.39* 09/21/2015 0601   CALCIUM 8.3* 09/21/2015 0601   PROT 5.2* 08/28/2015 0430   ALBUMIN 1.6* 08/28/2015 0430   AST 22 08/28/2015 0430   ALT 10* 08/28/2015 0430   ALKPHOS 80 08/28/2015 0430   BILITOT 0.6 08/28/2015 0430   GFRNONAA >60 09/21/2015 0601   GFRAA >60 09/21/2015 0601   Lipase     Component Value Date/Time   LIPASE 15* 05/19/2015 2140       Studies/Results: No results found.  Anti-infectives: Anti-infectives    Start     Dose/Rate Route Frequency Ordered Stop   09/06/15 1400  imipenem-cilastatin (PRIMAXIN) 500  mg in sodium chloride 0.9 % 100 mL IVPB  Status:  Discontinued     500 mg 200 mL/hr over 30 Minutes Intravenous 3 times per day 09/06/15 1110 09/06/15 1121   09/06/15 1200  imipenem-cilastatin (PRIMAXIN) 500 mg in sodium chloride 0.9 % 100 mL IVPB  Status:  Discontinued     500 mg 200 mL/hr over 30 Minutes Intravenous 4 times per day 09/06/15 1121 09/15/15 1728   09/01/15 1800  Ampicillin-Sulbactam (UNASYN) 3 g in sodium chloride 0.9 % 100 mL IVPB  Status:  Discontinued     3 g 100 mL/hr over 60 Minutes Intravenous Every 6 hours 09/01/15 1539 09/06/15 1105   08/27/15 2200  meropenem (MERREM) 1 g in sodium chloride 0.9 % 100 mL IVPB  Status:  Discontinued     1 g 200 mL/hr over 30 Minutes Intravenous 3 times per day 08/27/15 1658 09/01/15 1436   08/27/15 1615  metroNIDAZOLE (FLAGYL) tablet 500 mg  Status:  Discontinued     500 mg Oral 3 times per day 08/27/15 1610 08/28/15 1651       Assessment/Plan   1. Stage 4 sacral decub ulcer -wound is relatively clean.  Too much mucous drainage  from rectum for VAC to stick apparently.  VAC removed and BID dressing changes in place -will add daily carafate enemas to his routine to see if this will help decrease his rectal drainage.  If his rectal drainage decreases a wound VAC could be reapplied.   2. S/p subtotal colectomy/ileostomy with post op abscess/fluid collections -abx stopped. Still afebrile with no WBC -drains being managed currently by IR. Remain serous in nature and have been in for at least 4 weeks.  Suspect these could be DC soon.  Wound is clean and no further surgical intervention needed for the wound.  [facility]  If carafate enemas do not seem to help, there are no other suggestions as this is likely just how his body is reacting to possibly having some diversion colitis.  Once he is discharged from here, he will need to follow up with the wound care center for management of his wound.  He can follow up with Dr.  Dwain SarnaWakefield in 1 month for surgical follow up.  We will sign off.  Call if needed.   LOS: 25 days    Janet Humphreys E 09/21/2015, 7:43 AM Pager: [phone]

## 2015-09-21 NOTE — Progress Notes (Signed)
Physical Therapy Note  Patient Details  Name: Jonathan Le MRN: [id number] Date of Birth: 04/21/1946 Today's Date: 09/21/2015    Time: 1100-1155 55 minutes  1:1 No c/o pain. Pt able to perform bed mobility with [facility] with cues for UE placement with supervision.  Sit to stands with STEDY x 8 attempts with +2 mod A. Pt able to stand enough to place STEDY seat beneath him. 5 x partial stands from STEDY seat. Pt with difficulty coming to full standing. Pt unable to maintain upright trunk position while seated in STEDY.  W/c mobility 2 x 50' with supervision, frequent rests due to UE fatigue. Pt requires encouragement but is able to make progress throughotu treatment.   Tahra Hitzeman 09/21/2015, 11:55 AM

## 2015-09-21 NOTE — Progress Notes (Signed)
Social Work Patient ID: Jonathan Le, male   DOB: 05/18/1946, 69 y.o.   MRN: [id number] Met with pt, sister and brother in-law and left message for wife to discuss preference of NH's and to begin the search. He reports his wife has a aunt at [place] and it is on their side of town.  Will await wife's return call and send out FL2. Pt had his drains removed today.  Pt is pleased with his progress and wife came over the weekend to see him. Work on NH bed. 

## 2015-09-21 NOTE — Procedures (Addendum)
Pt has home CPAP at bedside. Pt is comfortable placing on self when ready and needs no assistance from RT.

## 2015-09-22 ENCOUNTER — Inpatient Hospital Stay (HOSPITAL_COMMUNITY): Payer: Medicare Other

## 2015-09-22 ENCOUNTER — Inpatient Hospital Stay (HOSPITAL_COMMUNITY): Payer: Medicare Other | Admitting: Occupational Therapy

## 2015-09-22 LAB — GLUCOSE, CAPILLARY
Glucose-Capillary: 97 mg/dL (ref 65–99)
Glucose-Capillary: 98 mg/dL (ref 65–99)
Glucose-Capillary: 118 mg/dL — ABNORMAL HIGH (ref 65–99)
Glucose-Capillary: 106 mg/dL — ABNORMAL HIGH (ref 65–99)

## 2015-09-22 MED ORDER — MINERAL OIL RE ENEM
1.0000 | ENEMA | Freq: Every morning | RECTAL | Status: DC
  Filled 2015-09-22: qty 1

## 2015-09-22 NOTE — Progress Notes (Signed)
Subjective/Complaints: No olive oil enema avail will order mineral oil, drains out                                                                ROS:   Denies CP, SOB, n/v/d. Strength improving. No insomnia, anxiety, appetite ok, pain controlled  Objective: Vital Signs: Blood pressure 99/52, pulse 90, temperature 98.2 F (36.8 C), temperature source Oral, resp. rate 17, height '5\' 9"'  (1.753 m), weight 111.449 kg (245 lb 11.2 oz), SpO2 97 %. No results found. Results for orders placed or performed during the hospital encounter of 08/27/15 (from the past 72 hour(s))  Glucose, capillary     Status: Abnormal   Collection Time: 09/19/15 11:26 AM  Result Value Ref Range   Glucose-Capillary 110 (H) 65 - 99 mg/dL   Comment 1 Notify RN   Glucose, capillary     Status: None   Collection Time: 09/19/15  4:38 PM  Result Value Ref Range   Glucose-Capillary 99 65 - 99 mg/dL   Comment 1 Notify RN   Glucose, capillary     Status: Abnormal   Collection Time: 09/19/15  8:56 PM  Result Value Ref Range   Glucose-Capillary 105 (H) 65 - 99 mg/dL  Protime-INR     Status: Abnormal   Collection Time: 09/20/15  4:37 AM  Result Value Ref Range   Prothrombin Time 26.5 (H) 11.6 - 15.2 seconds   INR 2.48 (H) 0.00 - 1.49  Glucose, capillary     Status: None   Collection Time: 09/20/15  6:50 AM  Result Value Ref Range   Glucose-Capillary 81 65 - 99 mg/dL  Glucose, capillary     Status: Abnormal   Collection Time: 09/20/15 11:20 AM  Result Value Ref Range   Glucose-Capillary 104 (H) 65 - 99 mg/dL  Glucose, capillary     Status: Abnormal   Collection Time: 09/20/15  4:24 PM  Result Value Ref Range   Glucose-Capillary 105 (H) 65 - 99 mg/dL  Glucose, capillary     Status: None   Collection Time: 09/20/15  9:17 PM  Result Value Ref Range   Glucose-Capillary 84 65 - 99 mg/dL   Comment 1 Notify RN   Protime-INR     Status: Abnormal   Collection Time: 09/21/15  6:01 AM  Result Value Ref Range   Prothrombin  Time 26.4 (H) 11.6 - 15.2 seconds   INR 2.46 (H) 0.00 - 2.53  Basic metabolic panel     Status: Abnormal   Collection Time: 09/21/15  6:01 AM  Result Value Ref Range   Sodium 135 135 - 145 mmol/L   Potassium 4.1 3.5 - 5.1 mmol/L   Chloride 103 101 - 111 mmol/L   CO2 26 22 - 32 mmol/L   Glucose, Bld 93 65 - 99 mg/dL   BUN 10 6 - 20 mg/dL   Creatinine, Ser 0.39 (L) 0.61 - 1.24 mg/dL   Calcium 8.3 (L) 8.9 - 10.3 mg/dL   GFR calc non Af Amer >60 >60 mL/min   GFR calc Af Amer >60 >60 mL/min    Comment: (NOTE) The eGFR has been calculated using the CKD EPI equation. This calculation has not been validated in all clinical situations. eGFR's persistently <60 mL/min signify possible Chronic  Kidney Disease.    Anion gap 6 5 - 15  CBC     Status: Abnormal   Collection Time: 09/21/15  6:01 AM  Result Value Ref Range   WBC 5.8 4.0 - 10.5 K/uL   RBC 3.29 (L) 4.22 - 5.81 MIL/uL   Hemoglobin 8.9 (L) 13.0 - 17.0 g/dL   HCT 29.4 (L) 39.0 - 52.0 %   MCV 89.4 78.0 - 100.0 fL   MCH 27.1 26.0 - 34.0 pg   MCHC 30.3 30.0 - 36.0 g/dL   RDW 15.7 (H) 11.5 - 15.5 %   Platelets 277 150 - 400 K/uL  Glucose, capillary     Status: None   Collection Time: 09/21/15  6:33 AM  Result Value Ref Range   Glucose-Capillary 83 65 - 99 mg/dL  Glucose, capillary     Status: None   Collection Time: 09/21/15 11:20 AM  Result Value Ref Range   Glucose-Capillary 98 65 - 99 mg/dL  Glucose, capillary     Status: None   Collection Time: 09/21/15  4:35 PM  Result Value Ref Range   Glucose-Capillary 92 65 - 99 mg/dL  Glucose, capillary     Status: Abnormal   Collection Time: 09/21/15  8:51 PM  Result Value Ref Range   Glucose-Capillary 113 (H) 65 - 99 mg/dL   Comment 1 Notify RN   Glucose, capillary     Status: None   Collection Time: 09/22/15  6:52 AM  Result Value Ref Range   Glucose-Capillary 97 65 - 99 mg/dL    Nursing note and vitals reviewed. Constitutional: He is oriented to person, place, and time.he is  slower today He appears well-developed and well-nourished. No distress.  HENT:  Head: Normocephalic and atraumatic.  Eyes: Conjunctivae and EOM are normal.   Neck: Decreased range of motion present.  Coughs without evidence of air leakage through healing trach site Cardiovascular: Normal rate and regular rhythm. noES Respiratory: Effort normal and breath sounds normal. No respiratory distress. He has no wheezes.    GI: Soft. Bowel sounds are normal.  Non-tender. Midline incision has healed . Ostomy with bilious drainage which appears decreased. Multiple drains LLQ sutured in place.no erythema  Genitourinary:  Foley in place.  Musculoskeletal: He exhibits edema b/l LE  Right knee with effusion. No tenderness.  Skin:  Right lower shin with stasis changes.  Diffuse macular rash Neurological: He is alert and oriented to person, place, and time.    Speech clear. Follows basic commands without difficulty.  Psychiatric: He has a normal mood and affect. His speech is normal and behavior is normal. Cognition and memory are normal.  Strength Antigravity through, except b/l hip flexors, Patient needs some assist to initiate hip flexion.   Sensation diminished in feet.    Assessment/Plan: 1. 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 Physiatrist is providing close team supervision and 24 hour management of active medical problems listed below. Physiatrist and rehab team continue to assess barriers to discharge/monitor patient progress toward functional and medical goals. Looks medically stable for SNF D/C  FIM: Function - Bathing Position: Sitting EOB Body parts bathed by patient: Right arm, Left arm, Chest, Abdomen, Left upper leg, Right upper leg, Right lower leg, Left lower leg Body parts bathed by  helper: Back, Front perineal area, Buttocks Bathing not applicable: Right lower leg, Left lower leg Assist  Level: Assistive device Assistive Device Comment: LH sponge and reacher  Function- Upper Body Dressing/Undressing What is the patient wearing?: Pull over shirt/dress Pull over shirt/dress - Perfomed by patient: Thread/unthread right sleeve, Thread/unthread left sleeve, Put head through opening Pull over shirt/dress - Perfomed by helper: Pull shirt over trunk Assist Level: Supervision or verbal cues Function - Lower Body Dressing/Undressing What is the patient wearing?: Pants, Non-skid slipper socks Position: Sitting EOB Underwear - Performed by helper: Thread/unthread right underwear leg, Thread/unthread left underwear leg, Pull underwear up/down Pants- Performed by patient: Thread/unthread right pants leg, Thread/unthread left pants leg Pants- Performed by helper: Pull pants up/down Non-skid slipper socks- Performed by patient: Don/doff right sock (doff only) Non-skid slipper socks- Performed by helper: Don/doff right sock, Don/doff left sock Socks - Performed by helper: Don/doff right sock, Don/doff left sock Assist for footwear: Dependant Assist for lower body dressing: Touching or steadying assistance (Pt > 75%)  Function - Toileting Toileting activity did not occur: Safety/medical concerns Assist level: Two helpers (per [place], NT report)  Function - Air cabin crew transfer activity did not occur: Safety/medical concerns  Function - Chair/bed transfer Chair/bed transfer activity did not occur: Safety/medical concerns Chair/bed transfer method: Lateral scoot Chair/bed transfer assist level: 2 helpers Chair/bed transfer assistive device: Mechanical lift Mechanical lift: Clarise Cruz Chair/bed transfer details: Manual facilitation for weight shifting, Verbal cues for safe use of DME/AE, Verbal cues for technique, Manual facilitation for placement  Function - Locomotion:  Wheelchair Will patient use wheelchair at discharge?: Yes Type: Manual Wheelchair activity did not occur: Safety/medical concerns Max wheelchair distance: 75 Assist Level: Supervision or verbal cues Wheel 50 feet with 2 turns activity did not occur: Safety/medical concerns Assist Level: Supervision or verbal cues Wheel 150 feet activity did not occur: Safety/medical concerns Turns around,maneuvers to table,bed, and toilet,negotiates 3% grade,maneuvers on rugs and over doorsills: No Function - Locomotion: Ambulation Ambulation activity did not occur: Safety/medical concerns Walk 10 feet activity did not occur: Safety/medical concerns Walk 50 feet with 2 turns activity did not occur: Safety/medical concerns Walk 150 feet activity did not occur: Safety/medical concerns Walk 10 feet on uneven surfaces activity did not occur: Safety/medical concerns  Function - Comprehension Comprehension: Auditory Comprehension assist level: Understands complex 90% of the time/cues 10% of the time  Function - Expression Expression: Verbal Expression assistive device: Talk trach valve Expression assist level: Expresses complex 90% of the time/cues < 10% of the time  Function - Social Interaction Social Interaction assist level: Interacts appropriately 90% of the time - Needs monitoring or encouragement for participation or interaction.  Function - Problem Solving Problem solving assist level: Solves basic 90% of the time/requires cueing < 10% of the time  Function - Memory Memory assist level: Recognizes or recalls 90% of the time/requires cueing < 10% of the time Patient normally able to recall (first 3 days only): Current season, Location of own room, Staff names and faces, That he or she is in a hospital  1. 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    2. LLE DVT  treatment /Anticoagulation: Pharmaceutical: Coumadin---therapeutic  , pharmacy protocol 3. Pain Management: Tylenol for prn for pain. Will continue oxycodone prn if needed for severe pain.Knee OA mod severe, bracing and voltaren gel, KO helping  -would benefit from scheduled oxy prior to am therapies---will schedule one hour ahead 4. Mood: LCSW to follow for evaluation and support.  5.  Neuropsych: This patient is capable of making decisions on his own behalf. 6. Skin/Wound Care:  wet to dry dressing  -  Cont air bed   - 7. Fluids/Electrolytes/Nutrition: Monitor I/O. improving   - Pt eating 100% of meals.  8. Abdominal abscess: monitor drain sites, catheters out 9. Hypokalemia: KCl supplements 10. VDRF/ History of OSA: Decannulated, no complications                      11. Anxiety disorder: resumed low dose Xanax as used this for insomnia at home. given his respiratory compromise will need to limit dosing.  Improving.  12. Anemia: Likely due to infection/chronic illness. Hgb stable at 8.9 on 11 7.  Will cont to monitor.                      13. Anasarca: Improved Continue Lasix, protein supplements. Recheck prealb 14. Diastolic CHF: Off amiodarone, Ace and statin at this time due to recurrent hypotension and multiple medical issues. Monitor for signs of overload. Weights stable. No SOB      LOS (Days) 26 A FACE TO FACE EVALUATION WAS PERFORMED KIRSTEINS,ANDREW E 09/22/2015, 8:32 AM

## 2015-09-22 NOTE — Progress Notes (Signed)
Have attempted twice this afternoon to contact Jonathan Le and left message for call back.

## 2015-09-22 NOTE — Progress Notes (Signed)
Physical Therapy Session Note  Patient Details  Name: Jonathan Le MRN: [id number] Date of Birth: 03/10/1946  Today's Date: 09/22/2015 PT Individual Time: 1110-1210; 1400-1505 PT Individual Time Calculation (min): 60 min . 65 min  Short Term Goals: Week 4: LTGs due to LOS     Skilled Therapeutic Interventions/Progress Updates:  Tx 1:  Rolling R and supine> sit with min assist for trunk elevation, using bedrails with HOB raised. Pt reported he has not equested voltaren before therapy.  PT requested RN apply Voltaren..  bil LEs ACE wrapped for edema control.  L knee brace donned by PT.  Bed> SaraStedy with +2 assistance.   partial stand> stand, +2 but pt unable to extend hips or back. Stedy> w/c.  Pt reported he needed to urinate; attempted x 2 but pt unable to do so in sitting in w/c. Pt tolerated partial standing in Stedy x 20 minutes.  Stedy>bed +2 assist. Pt assisted PT in doffing his R knee brace.  Pt able to manipulate bed positions, towels, urinal in order to urinate in sitting up in long sitting in bed.  PT consulted with RN regarding : 1. timing of Lasix to decrease urgency during therapy 2. premedicating bil knees before ACE wraps to bil LEs for edema mgt.    Pt left sitting up in bed, declining further assistance for using urinal.  He did void.  All needs left within reach.  Tx 2:  Pt sitting EOB at start of session.  He was premedicated with voltaren bil knees; no c/o pain. PT donned R knee brace.  +2 for SaraStedy to partial stand; attempted full standing, but pt unable to extend hips. PT placed vinyl gloves over pt's hands, which helped with his grip of bar of Stedy.  Pt tolerated multiple attempts at standing in BallardStedy, and static sitting x 20 minutes.  Stedy > w/c  +2.  W/c propulsion over level ground x 60' with supervision.  Through obstacle course of 8 cones x 2, encountering 2/16 cones slightly.  PT instructed pt in use of bil hands on rims during tight  turns for  efficiency; pt needed mod multimodal cues for this technique.  PT wrapped L w/c rim with Theratube.  Plan to wrap R rim tomorrow.  PT returned pt to room, left sitting up in w/c with all needs within reach. 2 pillows behind back for support as pt tends to lean forward to decrease pressure over sacral wounds.  Therapy Documentation Precautions:  Precautions Precautions: Fall Precaution Comments: sacral decub, trach, drain lines from abdomen Restrictions Weight Bearing Restrictions: No Other Position/Activity Restrictions: trach collar   Pain: Pain Assessment Pain Score: 5  Pain Location: Knee (bil ) Pain Intervention(s): Medication (See eMAR)      See Function Navigator for Current Functional Status.   Therapy/Group: Individual Therapy  Analisia Kingsford 09/22/2015, 12:21 PM

## 2015-09-22 NOTE — Progress Notes (Signed)
Pt has home CPAP set up at bedside and states he will place himself on later.

## 2015-09-22 NOTE — Progress Notes (Signed)
Social Work Patient ID: Ardyth HarpsHarold D Brunker, male   DOB: 01/09/1946, 69 y.o.   MRN: [id number] Spoke with wife via telephone to answer her questions regarding his wound care and that Pam-PA has left a message for her, She plans to call Pam back. Wife states: " I have been there the past few nights and I think it would be better for him to move from there and into a better setting than the hospital." She prefers [facility] and [facility], since one is close to their home and the other her aunt is there. She has questions about the wound vac and why not on him.  Will let Pam-PA speak with her and answer her questions and send his FL2 to the facilities above. Discussed there is no guarantee of a private room at whether facility. Will call wife tomorrow after team conference.

## 2015-09-22 NOTE — Progress Notes (Signed)
Social Work Patient ID: Ardyth HarpsHarold D Kallman, male   DOB: 11/15/1945, 69 y.o.   MRN: [id number] Received two messages over night form pt's wife asking multiple medical questions.  Have asked Pam-PA or MD to contact her to answer her questions regarding his sacral wound and the plan, Along with his heel wounds. Pam to contact wife to answer her questions. MD reports pt is medically stable for transfer to the next venue of care-[facility].

## 2015-09-22 NOTE — Plan of Care (Signed)
Problem: RH PAIN MANAGEMENT Goal: RH STG PAIN MANAGED AT OR BELOW PT'S PAIN GOAL <4 on 0-10 pain scale.  Outcome: Progressing No c/o pain     

## 2015-09-22 NOTE — Progress Notes (Signed)
Occupational Therapy Session Note  Patient Details  Name: Jonathan Le MRN: [id number] Date of Birth: 03/23/1946  Today's Date: 09/22/2015 OT Individual Time: [id number] OT Individual Time Calculation (min): 58 min    Short Term Goals: Week 4:  OT Short Term Goal 1 (Week 4): Pt will tolerate standing for 30 seconds with total assist +2 pt (30%) in order to increase endurance for LB selfcare OT Short Term Goal 2 (Week 4): Pt will transfer from supine to sit EOB with no more than min assist in preparation for selfcare tasks. OT Short Term Goal 3 (Week 4): Pt will perform LB dressing sit to supine with mod assist including pulling pants over his hips. OT Short Term Goal 4 (Week 4): Pt will increase BUE shoulder AROM to greater than 110 degrees to increase independence and efficiency with UB dressing.    Skilled Therapeutic Interventions/Progress Updates:    Pt performed ADL session supine to sit.  Utilized use of the reacher and the LH sponge with bathing and dressing in sitting.  Needed return to supine to perform peri care.  Pt able to wash the front part of his private area but needed max assist for washing his buttocks and cleaning up mucosal discharge.  Nursing notified once in supine position to address sacral wound as it continues to get fluid in it as well.  Min assist for transition from supine to sitting and back to supine this session.    Therapy Documentation Precautions:  Precautions Precautions: Fall Precaution Comments: sacral decub, trach, drain lines from abdomen Restrictions Weight Bearing Restrictions: No Other Position/Activity Restrictions: trach collar   Pain: Pain Assessment Pain Score: 5  Pain Location: Knee (bil ) Pain Intervention(s): Medication (See eMAR) ADL: See Function Navigator for Current Functional Status.   Therapy/Group: Individual Therapy  Malkie Wille OTR/L 09/22/2015, 12:28 PM

## 2015-09-22 NOTE — Progress Notes (Signed)
Attempted to contact patient's wife per her request. Left message on phone.

## 2015-09-22 NOTE — Discharge Summary (Signed)
Physician Discharge Summary  Patient ID: Jonathan Le MRN: [id number] DOB/AGE: 02/17/1946 69 y.o.  Admit date: 08/27/2015 Discharge date: 09/25/2015  Discharge Diagnoses:  Principal Problem:   Critical illness myopathy Active Problems:   OSA on CPAP   Debility   Intra-abdominal abscess (HCC)   Critical illness neuropathy (HCC)   Enterococcal infection   Sacral decubitus ulcer   Hypoalbuminemia due to protein-calorie malnutrition (HCC)   Abscess of abdominal cavity (HCC)   Chronic respiratory failure (HCC)   Tracheostomy care ([place])   Chronic systolic CHF (congestive heart failure) ([place])   Discharged Condition:  Stable.   Significant Diagnostic Studies:  Ct Abdomen Pelvis Wo Contrast  08/31/2015  CLINICAL DATA:  Intra-abdominal abscess, sacral decubitus, hypertension, CHF, prior CABG, morbid obesity EXAM: CT ABDOMEN AND PELVIS WITHOUT CONTRAST TECHNIQUE: Multidetector CT imaging of the abdomen and pelvis was performed following the standard protocol without IV contrast. Sagittal and coronal MPR images reconstructed from axial data set. Patient refused IV contrast. Small amount of oral contrast is present on the exam. COMPARISON:  08/21/2015 FINDINGS: LEFT pleural effusion. BILATERAL lower lobe atelectasis, cannot exclude infiltrate in LEFT lower lobe. Liver, spleen, pancreas, and adrenal glands unremarkable for technique. BILATERAL nonobstructing renal calculi. Pigtail drainage catheters lateral RIGHT mid abdomen and lateral LEFT mid abdomen and lateral upper LEFT pelvis. Decrease in sizes of abscess collections seen previously. RIGHT lateral abdominal abscess measures 5.7 x 2.0 cm image 45 previously 8.0 x 2.5 cm. Minimal residual fluid surrounding the cranial most drainage catheter, 4.0 x 1.5 cm image 38. Minimal residual fluid at 2 remaining abscesses in the lateral LEFT abdomen/ pelvis. Persistent thickening the lobe soft tissues out the lateral LEFT abdominal wall just above the  iliac crest. Significant fluid within rectal stump. Catheter decompresses urinary bladder with note of significant prostatic enlargement. Visualized bowel loops appear normal caliber without dilatation or evidence of obstruction. RIGHT lower quadrant ostomy again noted. Scattered atherosclerotic calcifications. No new abscess collections, free air or free fluid identified. LEFT inguinal hernia containing fat. Presacral abscess with gas extending to near bone without definite bone destruction. 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 No additional focal osseous abnormalities. IMPRESSION: Decreased sizes of abscess collections seen on previous exam post percutaneous drainage. Small LEFT pleural effusion with bibasilar atelectasis, little changed. Prostatic enlargement. LEFT inguinal hernia. BILATERAL nonobstructing renal calculi. No definite new intra-abdominal or intrapelvic abnormalities. Electronically Signed   By: Lavonia Dana M.D.   On: 08/31/2015 19:14   Dg Knee 1-2 Views Right  09/16/2015  CLINICAL DATA:  69 year old male with chronic right knee pain. No specific injury. Initial encounter. EXAM: RIGHT KNEE - 1-2 VIEW COMPARISON:  None. FINDINGS: Moderate to severe tricompartmental joint space loss and bulky osteophytosis. Small suprapatellar joint effusion suspected. Bulky superior patellar osteophyte. Superimposed calcified atherosclerosis about the knee. No acute fracture or dislocation identified. IMPRESSION: 1. Advanced tricompartmental degenerative changes. No acute osseous abnormality identified. 2. Calcified peripheral vascular disease. Electronically Signed   By: Genevie Ann M.D.   On: 09/16/2015 15:27   Ct Abdomen Pelvis W Contrast  09/18/2015  CLINICAL DATA:  Follow-up abdominal abscess. Status post colectomy for ischemic colitis. EXAM: CT ABDOMEN AND PELVIS WITH CONTRAST TECHNIQUE: Multidetector CT imaging of the abdomen and pelvis was performed using  the standard protocol following bolus administration of intravenous contrast. CONTRAST:  100 cc Omnipaque 300 IV. COMPARISON:  08/31/2015 CT abdomen/pelvis. FINDINGS: Lower chest: Lingular 4 mm pulmonary nodule (series 3/image 5), stable.  Small left pleural, decreased. Passive segmental left lower lobe dependent atelectasis, slightly decreased. Right, left anterior descending and left circumflex coronary atherosclerotic calcifications. Hepatobiliary: Normal liver with no liver mass. Normal gallbladder with no radiopaque cholelithiasis. No biliary ductal dilatation. Pancreas: Normal, with no mass or duct dilation. Spleen: Normal size. No mass. Adrenals/Urinary Tract: Normal adrenals. Nonobstructing tiny right renal stones, largest 4 mm in the upper right kidney. Nonobstructing 4 mm lower left renal stone. No hydronephrosis. Stable 4 mm stone in the dependent right bladder. Stomach/Bowel: Grossly normal stomach. Normal caliber small bowel with no small bowel wall thickening. Oral contrast traverses to the right lower quadrant end ileostomy. Status post total colectomy. The remnant is mildly distended by fluid with new nonspecific rectal wall thickening without appreciable rectal wall pneumatosis or new perirectal fat stranding. Vascular/Lymphatic: Atherosclerotic nonaneurysmal abdominal aorta. Patent portal, splenic, hepatic and renal veins. Stable patent prominent splenorenal shunt. No pathologically enlarged lymph nodes in the abdomen or pelvis. Reproductive: Stable moderate prostatomegaly. Other: Interval removal of the right percutaneous drainage catheter, with residual 2.6 x 1.5 cm anterior right lower quadrant fluid collection (series 8/image 30), decreased from 5.7 x 2.0 cm. Three percutaneous drainage catheter is in the lateral left abdominal wall are stable in position. There is a tiny 1.0 x 0.7 cm thick-walled fluid collection in the lateral left abdominal wall just inferior to the superior most drainage  catheter (series 2/ image 51), decreased from 1.8 x 1.0 cm. There is persistent ill-defined fat stranding and enhancement throughout the lateral left abdominal wall with minimal scattered internal foci of gas. No new focal definable collection is noted. No free intraperitoneal gas. No ascites. Musculoskeletal: No aggressive appearing focal osseous lesions. Visualized lower sternotomy wires are intact. Severe degenerative changes in the lumbar spine. Stable small fat containing left inguinal hernia. IMPRESSION: 1. Interval removal of right percutaneous drainage catheter, with residual small anterior right lower quadrant fluid collection, decreased. 2. Tiny residual abscess in the lateral left abdominal muscle wall, decreased. Persistent inflammatory changes in the lateral left abdominal muscle wall. No new fluid collections. 3. 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 4. New nonspecific wall thickening in the rectal remnant without pneumatosis, cannot exclude proctitis. 5. Numerous stable chronic findings as above. Electronically Signed   By: Ilona Sorrel M.D.   On: 09/18/2015 19:07    Labs:  Basic Metabolic Panel: BMP Latest Ref Rng 09/25/2015 09/21/2015 09/14/2015  Glucose 65 - 99 mg/dL 89 93 87  BUN 6 - 20 mg/dL _0 Creatinine 0.61 - 1.24 mg/dL 0.41(L) 0.39(L) 0.30(L)  Sodium 135 - 145 mmol/L 138 135 138  Potassium 3.5 - 5.1 mmol/L 3.8 4.1 3.4(L)  Chloride 101 - 111 mmol/L 103 103 102  CO2 22 - 32 mmol/L _1 Calcium 8.9 - 10.3 mg/dL 8.5(L) 8.3(L) 8.1(L)     CBC: CBC Latest Ref Rng 09/21/2015 09/14/2015 09/07/2015  WBC 4.0 - 10.5 K/uL 5.8 6.4 6.3  Hemoglobin 13.0 - 17.0 g/dL 8.9(L) 9.4(L) 9.3(L)  Hematocrit 39.0 - 52.0 % 29.4(L) 29.4(L) 31.2(L)  Platelets 150 - 400 K/uL 277 303 311    CBG:  Recent Labs Lab 09/24/15 0700 09/24/15 1138 09/24/15 1653 09/24/15 2104 09/25/15 0638  GLUCAP 85 118* 91  106* 89    Today's Vitals   09/24/15 0506 09/24/15 1126 09/24/15 1450 09/25/15 0512  BP: 103/60  109/66 100/59  Pulse: 94  110 84  Temp: 98.3  F (36.8 C)  97.5 F (36.4 C) 97.9 F (36.6 C)  TempSrc: Oral  Oral Oral  Resp: _0 Height:      Weight: 112.356 kg (247 lb 11.2 oz)   111.403 kg (245 lb 9.6 oz)  SpO2: 98%  98% 98%  PainSc:  5       Brief HPI:   Jonathan Le is a 69 year old with history of HTN, OSA, morbid obesity, anxiety disorder, CAD s/p CABG 05/22/2015 with post of A fib and RLE cellulitis who was originally admitted on 06/19/15 with respiratory failure from sepsis due to ischemic bowel. He required total colectomy with ileostomy by Dr. Barry Dienes. He had VDRF requiring trach and was  discharged to [facility] on 08/24. Patient self de-cannulated at some point but had aspiration event with respiratory failure requiring transfer back to acute hospital on 07/20/15. He developed septic shock and was found to have bilateral intra-abdominal abscess requiring drain placement by Dr Barbie Banner.  Antibiotics narrowed to Vancomycin and antifungin for enterococcus positive cultures. He developed respiratory failure requiring re-intubation and trach 09/08.  CCS consulted for rectal drainage and felt that this was likely due to stump breakdown with peritoneal fluid leakage per CCS. He was transferred back to [facility] on 07/25/15 for vent wean and tolerated extubation. He was XLT cuffed #6  Ws tolerating PMSV. Follow up CT abdomen 9/28 revealed enlargening 1-2 abscesses along left lateral wall which was aspirated with placement of 2 new drains by Dr. Anselm Pancoast.  He has had significant edema BLE and developed fevers 9/21 due to for Left popliteal DVT. He was stared on coumadin for treatment and anasarca treated with IV diuretics.  His abdominal incision is healing well and Stage III sacrococcygeal wound was debrided at bedside on 10/05.  Diet was advanced to regular diet but intake remained poor.  Metabolic  encephalopathy had resolved with high levels of anxiety reported. His activity tolerance was improving and he had met maximum benefit from [facility].  CIR was recommended for follow up therapy.     Hospital Course: Jonathan Le was admitted to rehab 08/27/2015 for inpatient therapies to consist of PT, ST and OT at least three hours five days a week. Past admission physiatrist, therapy team and rehab RN have worked together to provide customized collaborative inpatient rehab.  Vital signs have been stable and he has been afebrile during his rehab stay. Chronic systolic CHF was monitored with daily weights and has been  compensated.  Weight at discharge is 111.4 kg and ACE not resumed due to low blood pressures. Evaluation at admission revealed denuded areas on bilateral feet as well as significant tunneling at portion of sacral wound with fibro-necrotic tissue.  Nutritional supplements as well as Zinc and Vitamin C were added to help promote wound healing. 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 This was debrided at bedside by surgical PA and hydrotherapy was initiated to help debride residual adherent tissue. Currently sacral wound bed is relatively clear of necrotic tissue and VAC was attempted to help promote wound healing.  Patient has had excessive  mucoid rectal drainage which has interfered with adhesion of VAC therefore this was discontinued.  BID wet to dry dressing was recommended per surgery and VAC can be reapplied if drainage decreases.  Surgery felt that excessive drainage was due to body's reaction with diversion colitis.  Trial of Carafate enemas X 5 days  was recommended to see if this could help decrease rectal drainage.     ID was consulted for input on antibiotic regimen and recommended narrowing Primaxin to unasyn.  Patient developed diffuse rash due to  Side effect therefore this was discontinued and Primaxin resumed. CCS felt that  antibiotics could be discontinued as no signs of infection and patient was afebrile.   WBC has been stable, .  Dr. Tommy Medal was re-consulted and antibiotics could be discontinued with stable WBC as well as prolonged course of antibiotics.   He could be re-cultured if he showed and signs of infection. IVR has been following for input on abdominal abscess/drains. Serial abdominal/pelvic CT were done for monitoring and last abdominal CT showed no residual collection adjacent to the three drains.  Drains were removed on 11/07 as output from drains had decreased to less than 10 cc daily.  Foley was removed as mobility improved and no signs of retention noted on PVR checks. No dysuria reported and Proscar was resumed due to reports of hesitancy.  Blood sugars were monitored on ac/hs basis to help manage stress induced hyperglycemia. SSI was used to elevated BS and these have been reasonably controlled.  Po intake is slowly improving and no signs of dysphagia noted.  Blood pressures have been stable and hypokalemia has resolved with increase in supplement.  BLE edema is being managed with ace wraps for compression. Lasix was adjusted to bid per patient preference but patient has been refusing pm dose therefore this was discontinued.  Most recent check of lytes shows potassium as well as renal status to be stable.  He was maintained on coumadin for treatment of DVT and pharmacy has been assisting with management.  His INR has been relatively stable on 5 mg daily and recommend repeat check of INR on 11/14.  He tolerated corking of trach with use of CPAP at night was decannulated  without difficulty on 11/03. Prior trach site has closed in and is healing well. Midline abdominal incision is well healed hydrogel was applied to dry flaky skin.  No stridor or post tracheostomy evidence of stenosis noted. WOC has been educating patient on ostomy care. 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 He was fitted with hinged knee brace for support. This in addition to Sportscreme and tylenol bid has helped manage pain.  Ego support has been provided by team to  help alleviate anxiety. He continue to make slow, steady progress but still requires considerable amount of considerable amount of assistance. His wife is unable to provide care needed and has elected on SNF for ongoing care and therapy.  He was discharged to [facility] on 09/25/15.   Marland Kitchen   Rehab course: During patient's stay in rehab weekly team conferences were held to monitor patient's progress, set goals and discuss barriers to discharge. At admission, patient required total assist +2 for ADL tasks and max assist +2 for bed mobility with decreased sitting balance.  He had mild higher level cognitive deficits with decreased recall and decrease in problem solving.  He has had improvement in activity tolerance, balance, postural control, as well as ability to compensate for deficits.  His  cognition has improved with final MoCA score 27/30.  He requires supervision for basic cognitive tasks however depending on alertness and motivation can fluctuate from max assist to modified independent level. Speech therapy signed off on 10/27.  He is able to compete upper body bathing  with use of AE and requires assistance with back and BLE.  He is able to dress UB with set up assist and is dependent for lower body dressing. He requires +2 assist with Steady for transfers and is able to propel wheelchair 54' with BUE. He is able to sit at edge of mat for 20 minutes and perform dynamic activity.     Disposition: Skilled Nursing facility.   Diet:  Heart Healthy  Special Instructions: 1. Monitor clinically for stridor or any post tracheostomy evidence of stenosis. 2. Wet to dry dressing changes to sacrum BID.   3. Use CPAP at nights and when napping.  4.  Recheck BMET, CBC, protime in next 3 days.  5. Ace wrap BLE daily for edema  control.      Medication List    TAKE these medications        acetaminophen 325 MG tablet  Commonly known as:  TYLENOL  Take 2 tablets (650 mg total) by mouth 2 (two) times daily with breakfast and lunch.     antiseptic oral rinse Liqd  15 mLs by Mouth Rinse route 3 (three) times daily after meals.     ascorbic acid 500 MG tablet  Commonly known as:  VITAMIN C  Take 1 tablet (500 mg total) by mouth 2 (two) times daily.     diclofenac sodium 1 % Gel  Commonly known as:  VOLTAREN  Apply 2 g topically 4 (four) times daily to bilateral knees     famotidine 20 MG tablet  Commonly known as:  PEPCID  Take 1 tablet (20 mg total) by mouth 2 (two) times daily.     feeding supplement (PRO-STAT SUGAR FREE 64) Liqd  Take 30 mLs by mouth 3 (three) times daily.     finasteride 5 MG tablet  Commonly known as:  PROSCAR  Take 1 tablet (5 mg total) by mouth daily.     furosemide 40 MG tablet  Commonly known as:  LASIX  Take 1 tablet (40 mg total) by mouth daily.     hydrocerin Crea  Apply 1 application topically 2 (two) times daily to bilateral feet.     magnesium oxide 400 (241.3 MG) MG tablet  Commonly known as:  MAG-OX  Take 2 tablets (800 mg total) by mouth 2 (two) times daily.     multivitamin with minerals Tabs tablet  Take 1 tablet by mouth daily.     MUSCLE RUB 10-15 % Crea  Apply 1 application topically 3 (three) times daily before meals to bilateral shins.      ondansetron 4 MG tablet  Commonly known as:  ZOFRAN  Take 1 tablet (4 mg total) by mouth every 6 (six) hours as needed for nausea.     potassium chloride SA 15 MEQ tablet  Commonly known as:  KLOR-CON M15  Take 2 tablets (30 mEq total) by mouth 2 (two) times daily.     sucralfate 1 GM/10ML suspension  Commonly known as:  CARAFATE  Place 10 mLs (1 g total) rectally daily. For three days     tamsulosin 0.4 MG Caps capsule  Commonly known as:  FLOMAX  Take 1 capsule (0.4 mg total) by mouth daily after  supper.     warfarin 5 MG tablet  Commonly known as:  COUMADIN  Take 1 tablet (5 mg total) by mouth daily at 6 PM.     white petrolatum Gel  Commonly known as:  VASELINE  Apply 1 application topically daily.  Apply to L nare wound daily        Follow-up Information    Follow up with Rolm Bookbinder, MD On 10/21/2015.   Specialty:  General Surgery   Why:  arrive at 1:00pm for a 1:30pm appointment   Contact information:   [place] [address] [phone]       Follow up with [name], MD On 11/10/2015.   Specialty:  Physical Medicine and Rehabilitation   Why:  Be there 2:15 for 2:30 pm appointment   Contact information:   [place] [address] [phone]       Signed: Bary Leriche 09/25/2015, 9:52 AM

## 2015-09-23 ENCOUNTER — Inpatient Hospital Stay (HOSPITAL_COMMUNITY): Payer: Medicare Other | Admitting: Occupational Therapy

## 2015-09-23 ENCOUNTER — Inpatient Hospital Stay (HOSPITAL_COMMUNITY): Payer: Medicare Other | Admitting: *Deleted

## 2015-09-23 ENCOUNTER — Inpatient Hospital Stay (HOSPITAL_COMMUNITY): Payer: Medicare Other

## 2015-09-23 LAB — GLUCOSE, CAPILLARY
GLUCOSE-CAPILLARY: 102 mg/dL — AB (ref 65–99)
Glucose-Capillary: 83 mg/dL (ref 65–99)
Glucose-Capillary: 110 mg/dL — ABNORMAL HIGH (ref 65–99)
Glucose-Capillary: 81 mg/dL (ref 65–99)

## 2015-09-23 LAB — PROTIME-INR
INR: 2.52 — ABNORMAL HIGH (ref 0.00–1.49)
Prothrombin Time: 26.9 seconds — ABNORMAL HIGH (ref 11.6–15.2)

## 2015-09-23 NOTE — Consult Note (Addendum)
11/4 WOC follow-up: Sacrum dressing changed and assessed wound appearance. Moist gauze packing in place since unable to maintain a Vac seal due to leakage from buttocks, which surgical team is following for assessment and plan of care.There is mod amt thick gelatinous drainage from rectum when patient was turned which would impair seal to Vac in this location. Stage 4 wound beefy red, mod amt tan drainage, no odor, outer wound red and moist partial thickness. Stoma red and viable, slightly above skin level, 1 1/4 inches, red macerated peristomal area from previous assessment has resolved with the use of a barrier ring, intact skin surrounding.Mod amt liquid brown stool in pouch.  Pt states he is practicing emptying when OOB.  Demonstrated pouch change using 2 piece pouching system and barrier ring to maintain seal. Discussed pouching routines, emptying, and ordering supplies, and dietary precautions. Pt asks appropriate questions and verbalizes understanding. Supplies at bedside for staff nurse use and educational materials left at bedside. Pt is able to open and close the velcro to empty. Recommend continued assistance with pouch application after discharge; either nursing facility or home health agency.  Placed on [facility] discharge program: Yes Cammie Mcgeeawn Takiesha Mcdevitt MSN, RN, Rober MinionCWOCN, [place], CNS [phone]

## 2015-09-23 NOTE — Progress Notes (Signed)
Social Work Patient ID: Jonathan Le, male   DOB: 12/30/1945, 69 y.o.   MRN: [id number] Met with pt and left message for wife to discuss team conference progression toward his goals and next venue of care. Pt would like to stay here but understands and is ok with pursuing NH.  He and wife have decided upon [facility] and [facility]. Pt felt if he stayed one more week he would be able to go home, but he is not being realistic regarding this.He will need several weeks of therapies to be ambulatory again. Have sent out FL2.

## 2015-09-23 NOTE — Progress Notes (Signed)
Social Work Patient ID: Jonathan Le, male   DOB: 01/30/1946, 69 y.o.   MRN: [id number] Spoke with wife she is going to go to [facility] to tour, she wanted to know if they could refuse pt.  Informed her they could not have a male bed or that They could not meet his needs of care. She felt this was ridiculous and will address with them. Aware FL2 out and looking for NH bed and if no bed offer made from either facility would Need to expand the bed search and could even cover 50 mile radius.

## 2015-09-23 NOTE — Progress Notes (Signed)
Occupational Therapy Session Note  Patient Details  Name: Jonathan Le MRN: [id number] Date of Birth: 06/13/1946  Today's Date: 09/23/2015 OT Individual Time: [id number] OT Individual Time Calculation (min): 56 min    Skilled Therapeutic Interventions/Progress Updates:    Pt transferred to the wheelchair from EOB using the Steady Lift with total assist +2 (pt 25%).  He was able to roll his wheelchair from his room down to the gym with occasional min assist for turning.  Once in the gym worked on sit to stand from the wheelchair as well as standing endurance.  Utilized three muskateers technique for sit to stand.  Pt unable to achieve upright lumbar or cervical extension and maintains knee flexion bilaterally.  Performed 3 intervals of standing with total assist +2 (pt 25%) for 30 seconds, 1 min 30 secs, and 1 min.  Rest breaks allowed for 3-4 mins between each interval.  Max demonstrational cueing given for facilitation of lumbar extension as well as knee control bilaterally.  Pt reporting increased LB pain with attempted lumbar extension in standing.  Pt left in gym with therapy tech and PT.    Therapy Documentation Precautions:  Precautions Precautions: Fall Precaution Comments: sacral decub, trach, drain lines from abdomen Restrictions Weight Bearing Restrictions: No Other Position/Activity Restrictions: trach collar  Pain: Pain Assessment Pain Assessment: Faces Faces Pain Scale: Hurts little more Pain Type: Acute pain Pain Location: Back Pain Onset: With Activity Pain Intervention(s): Repositioned;Emotional support ADL: See Function Navigator for Current Functional Status.   Therapy/Group: Individual Therapy  Oreatha Fabry OTR/L 09/23/2015, 4:36 PM

## 2015-09-23 NOTE — Progress Notes (Signed)
Pt has home machine in room. Pts family is at bedside and will place on when pt is ready. Pt and family encouraged to call RT if needing any assistance.

## 2015-09-23 NOTE — Progress Notes (Signed)
Physical Therapy Session Note  Patient Details  Name: Jonathan Le MRN: [id number] Date of Birth: 09/21/1946  Today's Date: 09/23/2015 PT Individual Time: 1105-1205 PT Individual Time Calculation (min): 60 min   Short Term Goals: Week 4: = LTGs due to LOS    Skilled Therapeutic Interventions/Progress Updates:  Pt sitting up in w/c. He reported that he did some of his general exs via hand out yesterday evening in the bed.  PT provided him with a towel roll held with tape, for him to keep within reach to facilitate some exs.  Pt reported he did request Voltaren bil knees but did not request any oral meds before therapy.  W/c propulsion in hall including turns, with extra time and cues for efficient use of bil hands for turns.   He stated that he "easily" got up from bed into Stedy this morning with OT.  From w/c, +2 to elevate hips into Stedy.  Partial stand in Stedy > lifting hips approx 1" x 4 while reaching with 1 hand to place card on matching board at eye level, +1.  Pt exhausted; rested 2-3 minutes before attempting standing with hips off of seat.  Using both hands on bar, pt required +2 to elevate hips enough in order to sit back in w/c.  PT returned pt to room and left all needs within reach.    Therapy Documentation Precautions:  Precautions Precautions: Fall Precaution Comments: sacral decub, trach, drain lines from abdomen Restrictions Weight Bearing Restrictions: No Other Position/Activity Restrictions: trach collar   Pain: rated 7/10 at end of session, bil knees.  PT recommended that pt request ice packs.        See Function Navigator for Current Functional Status.   Therapy/Group: Individual Therapy  Kristyna Bradstreet 09/23/2015, 12:24 PM

## 2015-09-23 NOTE — Progress Notes (Signed)
Occupational Therapy Session Note  Patient Details  Name: Jonathan HarpsHarold D Varney MRN: [id number] Date of Birth: 12/18/1945  Today's Date: 09/23/2015 OT Individual Time: [id number] OT Individual Time Calculation (min): 58 min    Short Term Goals: Week 4:  OT Short Term Goal 1 (Week 4): Pt will tolerate standing for 30 seconds with total assist +2 pt (30%) in order to increase endurance for LB selfcare OT Short Term Goal 2 (Week 4): Pt will transfer from supine to sit EOB with no more than min assist in preparation for selfcare tasks. OT Short Term Goal 3 (Week 4): Pt will perform LB dressing sit to supine with mod assist including pulling pants over his hips. OT Short Term Goal 4 (Week 4): Pt will increase BUE shoulder AROM to greater than 110 degrees to increase independence and efficiency with UB dressing.    Skilled Therapeutic Interventions/Progress Updates:    Worked on bathing and dressing supine to sit to stand this session.  Pt worked on washing peri area in bed prior to transfer to EOB.  Min assist for rolling as pt still needs mod instructional cueing to flex his knees to initiate rolling instead of immediately reaching for the bed rail.  Gave pt washcloth in sidelying an had him work on washing buttocks.  Min assist needed for washing thoroughly secondary to pt not being able to reach his anus.  Positioned pt in supine with HOB elevated approximately 25 degrees to wash front peri area.  He was able to complete this with supervision as well.   Finished bathing sitting EOB with setup using reacher and LH sponge.  Pt also removed gripper socks and donned shorts over feet using reacher as well.  Therapist assist with donning gripper socks secondary to time, but pt may benefit from trial of large sockaide next ADL session.  Utilized Steady lift for standing in order for 2 therapists to pull pants over hips.  Transferred to wheelchair via steady as well to conclude session.  Pt left in the wheelchair  with call button and phone within reach.    Therapy Documentation Precautions:  Precautions Precautions: Fall Precaution Comments: sacral decub, trach, drain lines from abdomen Restrictions Weight Bearing Restrictions: No Other Position/Activity Restrictions: trach collar  Pain: Pain Assessment Pain Assessment: No/denies pain ADL: See Function Navigator for Current Functional Status.   Therapy/Group: Individual Therapy  Riyan Gavina OTR/L 09/23/2015, 12:31 PM

## 2015-09-23 NOTE — Progress Notes (Signed)
ANTICOAGULATION CONSULT NOTE - Follow Up Consult  Pharmacy Consult for coumadin Indication: DVT  Allergies  Allergen Reactions  . Unasyn [Ampicillin-Sulbactam Sodium] Hives, Itching and Rash    Diffuse rash not responding to Pepcid and Benadryl.  TDD.  Marland Kitchen. Levaquin [Levofloxacin] Nausea Only  . Penicillins Hives  . Doxycycline Rash    Patient Measurements: Height: 5\' 9"  (175.3 cm) Weight: 240 lb 11.9 oz (109.2 kg) IBW/kg (Calculated) : 70.7 Heparin Dosing Weight:   Vital Signs: Temp: 98 F (36.7 C) (11/09 0652) Temp Source: Oral (11/09 0652) BP: 105/59 mmHg (11/09 0652) Pulse Rate: 102 (11/09 0652)  Labs:  Recent Labs  09/21/15 0601 09/23/15 0420  HGB 8.9*  --   HCT 29.4*  --   PLT 277  --   LABPROT 26.4* 26.9*  INR 2.46* 2.52*  CREATININE 0.39*  --     Estimated Creatinine Clearance: 104.6 mL/min (by C-G formula based on Cr of 0.39).   Medications:  Scheduled:  . acetaminophen  650 mg Oral BID WC  . antiseptic oral rinse  15 mL Mouth Rinse TID PC  . diclofenac sodium  2 g Topical QID  . famotidine  20 mg Oral BID  . feeding supplement (PRO-STAT SUGAR FREE 64)  30 mL Oral TID  . finasteride  5 mg Oral Daily  . furosemide  20 mg Oral Q24H  . furosemide  40 mg Oral Daily  . hydrocerin   Topical BID  . insulin aspart  0-5 Units Subcutaneous QHS  . insulin aspart  0-9 Units Subcutaneous TID WC  . magnesium oxide  800 mg Oral BID  . multivitamin with minerals  1 tablet Oral Daily  . MUSCLE RUB   Topical TID AC  . oxyCODONE  2.5 mg Oral Q0600  . potassium chloride  40 mEq Oral BID  . sodium chloride  10 mL Intravenous Q12H  . sucralfate  1 g Rectal Daily  . tamsulosin  0.4 mg Oral QPC supper  . vitamin C  500 mg Oral BID  . warfarin  5 mg Oral q1800  . Warfarin - Pharmacist Dosing Inpatient   Does not apply q1800   Infusions:    Assessment: [age] male with DVT is currently on therapeutic coumadin.  INR today is 2.52.  Goal of Therapy:  INR 2-3 Monitor  platelets by anticoagulation protocol: Yes   Plan:  - Warfarin 5mg  daily - Monitor daily INR, CBC, s/sx of bleeding - INR MWF  Sinjin Amero, Tsz-Yin 09/23/2015,8:21 AM

## 2015-09-23 NOTE — Patient Care Conference (Signed)
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 Tracheostomy care (HCC) 09/18/2015  . Chronic respiratory failure (HCC) 09/17/2015  . Abscess of abdominal cavity (HCC)   . Enterococcal infection   . Sacral decubitus ulcer   . Hypoalbuminemia due to protein-calorie malnutrition (HCC)   . 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 Intra-abdominal abscess (HCC)   . Debility 08/27/2015  . Acute on chronic respiratory failure, unspecified whether with hypoxia or hypercapnia (HCC)   . Encounter for orogastric (OG) tube placement   . Abdominal abscess (HCC)   . Abscess   . Encounter for central line placement   . Encounter for intubation   . Respiratory failure (HCC)   . Septic shock (HCC)   . Blood poisoning (HCC)   . Tracheostomy status (HCC)   . Acute respiratory failure (HCC)   . Pressure ulcer 06/21/2015  . Shock (HCC) 06/18/2015  . Abdominal pain, generalized   . Shortness of breath   . Hypoxia   . 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 Cardiomyopathy, ischemic 05/21/2015  . CAD, multiple vessel 05/21/2015  . Acute systolic congestive heart failure, NYHA class 3 (HCC) 05/21/2015  . Morbid obesity (HCC) 05/20/2015  . OSA on CPAP 05/20/2015  . Other specified hypotension   . NSTEMI (non-ST elevated myocardial infarction) (HCC) 05/19/2015  . Benign prostatic hyperplasia with urinary  obstruction 04/24/2015  . Chronic LBP 12/05/2013  . Essential hypertension 09/04/2013    Expected Discharge Date: Expected Discharge Date: 09/24/15  Team Members Present: Physician leading conference: Dr. Claudette LawsAndrew Kirsteins Social Worker Present: Dossie DerBecky Nehal Witting, LCSW Nurse Present: Carmie EndAngie Joyce, RN PT Present: Wanda Plumparoline Cook, PT OT Present: Perrin MalteseJames McGuire, OT SLP Present: Jackalyn LombardNicole Page, SLP PPS Coordinator present : Tora DuckMarie Noel, RN, CRRN     Current Status/Progress Goal Weekly Team Focus  Medical   Janina Mayorach is out, Foley is out, abdominal drains are out, stage IV sacral decubiti  One-person assistance, 24 7 max care  Reduced rectal stump drainage   Bowel/Bladder   Continent of bladder. Ilelosotimy intact with liquid stool  max assist   Continue to educate care and emptying of ileostomty bag   Swallow/Nutrition/ Hydration     na        ADL's   supervision for UB selfcare sitting unsupported EOB, min assist for LB bathing and for donning pants shorts with AE, needs max assist for donning socks and for pulling pants over hips in supine,   total assist +2 for transfers using the steady.  Unable to maintain upright standing for selfcare tasks.   mod to max assist  selfcare retraining, sit to stand transitons, balance, UE strengthening, AE/DME use, pt/family education   Mobility   sits EOB x 20 min with supervision, +2 transfer with mechanical lift; 1.5 min tolerance of standing in // +2; supervision w/c x 80'; +2 sit>  partial stand  supervision bed, max assist basic transfers, max assist gait x 10', supervision w/c x 100' controlled, 200' community  bed mobiltiy, pre-gait, transfers, tolerance of partial standing in [place]. awareness of body-in -space   Communication     na        Safety/Cognition/ Behavioral Observations    no unsafe behaviors        Pain   Scheduled Tylenol  BID  less than 3  Offer pain medication prior to initital therapy session   Skin   Unstageable wounds to  bilateral heels, Sacral wound requiring BID dressing change, mdiline abominal incision healing appropriately  no new breakdown total assist   Continue with daily dressing change, assess wounds q shift      *See Care Plan and progress notes for long and short-term goals.  Barriers to Discharge: No medical barriers, if rectal drainage reduces, may be able to reapply wound VAC    Possible Resolutions to Barriers:  See above, pursue SNF placement    Discharge Planning/Teaching Needs:  Looking for NH bed, wife not able to provide the care pt requires at this time needs him to be more mobile      Team Discussion:  Making slow progress in therapies-standing with steady but requiring two people for safety. Pt learning colostomy care. Trach out, foley-out, drains-out. MD feels medically ready to transfer to NH. Gen surgery addressing fluid output in rectum. Knee brace helping with pain and stability.   Revisions to Treatment Plan:  NHP   Continued Need for Acute Rehabilitation Level of Care: The patient requires daily medical management by a physician with specialized training in physical medicine and rehabilitation for the following conditions: Daily direction of a multidisciplinary physical rehabilitation program to ensure safe treatment while eliciting the highest outcome that is of practical value to the patient.: Yes Daily medical management of patient stability for increased activity during participation in an intensive rehabilitation regime.: Yes Daily analysis of laboratory values and/or radiology reports with any subsequent need for medication adjustment of medical intervention for : Neurological problems;Other;Post surgical problems  Jonathan Le 09/24/2015, 8:44 AM

## 2015-09-23 NOTE — Progress Notes (Signed)
Physical Therapy Session Note  Patient Details  Name: Jonathan Le MRN: [id number] Date of Birth: 07/03/1946  Today's Date: 09/23/2015 PT Individual Time: 1530-1558 PT Individual Time Calculation (min): 28 min   Short Term Goals: =LTGs  Skilled Therapeutic Interventions/Progress Updates:    Handoff from OT in therapy gym.  Session focused on transfers using Stedy and LE strengthening. Rec therapist present during first half of session.  RT assisted with patient transport back to room and +2 transfer using stedy w/c>bed.  Pt demonstrates some improvement in forward weight shift and trunk extension when transitioning but continues to be unable to come to full standing.  Once sitting EOB, pt transitioned to supine with assist to lift LLE into bed.  PT instructed patient in HEP for BLEs, x10 reps of ankle circles, glute/quad sets, SAQ, heel slides, and hip abd/add to midline.  Pt positioned to comfort at end of session with call bell in reach and needs met.    Therapy Documentation Precautions:  Precautions Precautions: Fall Precaution Comments: sacral decub, trach, drain lines from abdomen Restrictions Weight Bearing Restrictions: No Other Position/Activity Restrictions: trach collar Pain: Pain Assessment Pain Assessment: 0-10 Pain Score: 4  Faces Pain Scale: Hurts little more Pain Type: Acute pain Pain Location: Back Pain Onset: With Activity Pain Intervention(s): Emotional support   See Function Navigator for Current Functional Status.   Therapy/Group: Individual Therapy  Earnest Conroy Penven-Crew 09/23/2015, 4:45 PM

## 2015-09-23 NOTE — Progress Notes (Signed)
Subjective/Complaints: Discussed with wound care nurse, patient learning colostomy care. Patient also receiving Carafate enemas for rectal stump to reduce Mucous discharge                                                                ROS:   Denies CP, SOB, n/v/d. Strength improving. No insomnia, anxiety, appetite ok, pain controlled  Objective: Vital Signs: Blood pressure 105/59, pulse 102, temperature 98 F (36.7 C), temperature source Oral, resp. rate 18, height '5\' 9"'  (1.753 m), weight 109.2 kg (240 lb 11.9 oz), SpO2 94 %. No results found. Results for orders placed or performed during the hospital encounter of 08/27/15 (from the past 72 hour(s))  Glucose, capillary     Status: Abnormal   Collection Time: 09/20/15 11:20 AM  Result Value Ref Range   Glucose-Capillary 104 (H) 65 - 99 mg/dL  Glucose, capillary     Status: Abnormal   Collection Time: 09/20/15  4:24 PM  Result Value Ref Range   Glucose-Capillary 105 (H) 65 - 99 mg/dL  Glucose, capillary     Status: None   Collection Time: 09/20/15  9:17 PM  Result Value Ref Range   Glucose-Capillary 84 65 - 99 mg/dL   Comment 1 Notify RN   Protime-INR     Status: Abnormal   Collection Time: 09/21/15  6:01 AM  Result Value Ref Range   Prothrombin Time 26.4 (H) 11.6 - 15.2 seconds   INR 2.46 (H) 0.00 - 9.76  Basic metabolic panel     Status: Abnormal   Collection Time: 09/21/15  6:01 AM  Result Value Ref Range   Sodium 135 135 - 145 mmol/L   Potassium 4.1 3.5 - 5.1 mmol/L   Chloride 103 101 - 111 mmol/L   CO2 26 22 - 32 mmol/L   Glucose, Bld 93 65 - 99 mg/dL   BUN 10 6 - 20 mg/dL   Creatinine, Ser 0.39 (L) 0.61 - 1.24 mg/dL   Calcium 8.3 (L) 8.9 - 10.3 mg/dL   GFR calc non Af Amer >60 >60 mL/min   GFR calc Af Amer >60 >60 mL/min    Comment: (NOTE) The eGFR has been calculated using the CKD EPI equation. This calculation has not been validated in all clinical situations. eGFR's persistently <60 mL/min signify possible Chronic  Kidney Disease.    Anion gap 6 5 - 15  CBC     Status: Abnormal   Collection Time: 09/21/15  6:01 AM  Result Value Ref Range   WBC 5.8 4.0 - 10.5 K/uL   RBC 3.29 (L) 4.22 - 5.81 MIL/uL   Hemoglobin 8.9 (L) 13.0 - 17.0 g/dL   HCT 29.4 (L) 39.0 - 52.0 %   MCV 89.4 78.0 - 100.0 fL   MCH 27.1 26.0 - 34.0 pg   MCHC 30.3 30.0 - 36.0 g/dL   RDW 15.7 (H) 11.5 - 15.5 %   Platelets 277 150 - 400 K/uL  Glucose, capillary     Status: None   Collection Time: 09/21/15  6:33 AM  Result Value Ref Range   Glucose-Capillary 83 65 - 99 mg/dL  Glucose, capillary     Status: None   Collection Time: 09/21/15 11:20 AM  Result Value Ref Range   Glucose-Capillary 98 65 -  99 mg/dL  Glucose, capillary     Status: None   Collection Time: 09/21/15  4:35 PM  Result Value Ref Range   Glucose-Capillary 92 65 - 99 mg/dL  Glucose, capillary     Status: Abnormal   Collection Time: 09/21/15  8:51 PM  Result Value Ref Range   Glucose-Capillary 113 (H) 65 - 99 mg/dL   Comment 1 Notify RN   Glucose, capillary     Status: None   Collection Time: 09/22/15  6:52 AM  Result Value Ref Range   Glucose-Capillary 97 65 - 99 mg/dL  Glucose, capillary     Status: None   Collection Time: 09/22/15 11:17 AM  Result Value Ref Range   Glucose-Capillary 98 65 - 99 mg/dL  Glucose, capillary     Status: Abnormal   Collection Time: 09/22/15  4:29 PM  Result Value Ref Range   Glucose-Capillary 118 (H) 65 - 99 mg/dL  Glucose, capillary     Status: Abnormal   Collection Time: 09/22/15  9:02 PM  Result Value Ref Range   Glucose-Capillary 106 (H) 65 - 99 mg/dL  Protime-INR     Status: Abnormal   Collection Time: 09/23/15  4:20 AM  Result Value Ref Range   Prothrombin Time 26.9 (H) 11.6 - 15.2 seconds   INR 2.52 (H) 0.00 - 1.49  Glucose, capillary     Status: None   Collection Time: 09/23/15  6:51 AM  Result Value Ref Range   Glucose-Capillary 83 65 - 99 mg/dL    Nursing note and vitals reviewed. Constitutional: He is  oriented to person, place, and time.he is slower today He appears well-developed and well-nourished. No distress.  HENT:  Head: Normocephalic and atraumatic.  Eyes: Conjunctivae and EOM are normal.   Neck: Decreased range of motion present.  Coughs without evidence of air leakage through healing trach site Cardiovascular: Normal rate and regular rhythm. noES Respiratory: Effort normal and breath sounds normal. No respiratory distress. He has no wheezes.    GI: Soft. Bowel sounds are normal.  Non-tender. Midline incision has healed . Ostomy with bilious drainage which appears decreased. Multiple drains LLQ sutured in place.no erythema  Genitourinary:  Foley in place.  Musculoskeletal: He exhibits edema b/l LE  Right knee with effusion. No tenderness.  Skin:  Right lower shin with stasis changes.  Diffuse macular rash Neurological: He is alert and oriented to person, place, and time.    Speech clear. Follows basic commands without difficulty.  Psychiatric: He has a normal mood and affect. His speech is normal and behavior is normal. Cognition and memory are normal.  Strength Antigravity through, except b/l hip flexors, Patient needs some assist to initiate hip flexion.   Sensation diminished in feet.    Assessment/Plan: 1. 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 Physiatrist is providing close team supervision and 24 hour management of active medical problems listed below. Physiatrist and rehab team continue to assess barriers to discharge/monitor patient progress toward functional and medical goals.  In the last week tracheostomy removed, Foley removed, abdominal drains removed,  Team conference today please see physician documentation under team conference tab, met with team face-to-face to  discuss problems,progress, and goals. Formulized individual treatment plan based on medical history, underlying problem and comorbidities.  FIM: Function - Bathing Position: Sitting EOB Body parts bathed by patient: Right arm, Left arm,  Chest, Abdomen, Left upper leg, Right upper leg, Right lower leg, Left lower leg Body parts bathed by helper: Back, Front perineal area, Buttocks Bathing not applicable: Right lower leg, Left lower leg Assist Level: Assistive device Assistive Device Comment: LH sponge and reacher  Function- Upper Body Dressing/Undressing What is the patient wearing?: Pull over shirt/dress Pull over shirt/dress - Perfomed by patient: Thread/unthread right sleeve, Thread/unthread left sleeve, Put head through opening Pull over shirt/dress - Perfomed by helper: Pull shirt over trunk Assist Level: Supervision or verbal cues Function - Lower Body Dressing/Undressing What is the patient wearing?: Pants, Non-skid slipper socks Position: Sitting EOB Underwear - Performed by helper: Thread/unthread right underwear leg, Thread/unthread left underwear leg, Pull underwear up/down Pants- Performed by patient: Thread/unthread right pants leg, Thread/unthread left pants leg Pants- Performed by helper: Pull pants up/down Non-skid slipper socks- Performed by patient: Don/doff right sock, Don/doff left sock Non-skid slipper socks- Performed by helper: Don/doff right sock, Don/doff left sock Socks - Performed by helper: Don/doff right sock, Don/doff left sock Assist for footwear: Dependant Assist for lower body dressing: Touching or steadying assistance (Pt > 75%)  Function - Toileting Toileting activity did not occur: Safety/medical concerns Assist level: Two helpers (per [place], NT report)  Function - Air cabin crew transfer activity did not occur: Safety/medical concerns  Function - Chair/bed transfer Chair/bed transfer activity did not occur: Safety/medical  concerns Chair/bed transfer method: Lateral scoot Chair/bed transfer assist level: 2 helpers Chair/bed transfer assistive device: Mechanical lift Mechanical lift: Stedy Chair/bed transfer details: Manual facilitation for weight shifting, Verbal cues for safe use of DME/AE, Verbal cues for technique  Function - Locomotion: Wheelchair Will patient use wheelchair at discharge?: Yes Type: Manual Wheelchair activity did not occur: Safety/medical concerns Max wheelchair distance: 60 Assist Level: Supervision or verbal cues Wheel 50 feet with 2 turns activity did not occur: Safety/medical concerns Assist Level: Supervision or verbal cues Wheel 150 feet activity did not occur: Safety/medical concerns Turns around,maneuvers to table,bed, and toilet,negotiates 3% grade,maneuvers on rugs and over doorsills: No Function - Locomotion: Ambulation Ambulation activity did not occur: Safety/medical concerns Walk 10 feet activity did not occur: Safety/medical concerns Walk 50 feet with 2 turns activity did not occur: Safety/medical concerns Walk 150 feet activity did not occur: Safety/medical concerns Walk 10 feet on uneven surfaces activity did not occur: Safety/medical concerns  Function - Comprehension Comprehension: Auditory Comprehension assist level: Understands complex 90% of the time/cues 10% of the time  Function - Expression Expression: Verbal Expression assistive device: Talk trach valve Expression assist level: Expresses complex 90% of the time/cues < 10% of the time  Function - Social Interaction Social Interaction assist level: Interacts appropriately 90% of the time - Needs monitoring or encouragement for participation or interaction.  Function - Problem Solving Problem solving assist level: Solves basic 25 - 49% of the time - needs direction more than half the time to initiate, plan or complete simple activities  Function - Memory Memory assist level: Recognizes or recalls 25 -  49% of the time/requires cueing 50 - 75% of the time Patient normally able to recall (first 3 days only): Current season, Location of own room, Staff names and faces, That he or she is in a hospital  1. 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    2. LLE DVT treatment /Anticoagulation: Pharmaceutical: Coumadin---therapeutic  , pharmacy protocol 3. Pain Management: Tylenol for prn  for pain. Will continue oxycodone prn if needed for severe pain.Knee OA mod severe, bracing and voltaren gel, KO helping  -would benefit from scheduled oxy prior to am therapies---will schedule one hour ahead 4. Mood: LCSW to follow for evaluation and support.  5. Neuropsych: This patient is capable of making decisions on his own behalf. 6. Skin/Wound Care:  wet to dry dressing,  -  Cont air bed wound VAC removed Due to rectal stump discharge. If this symptom proves this may be reapplied and can be used in skilled nursing setting  - 7. Fluids/Electrolytes/Nutrition: Monitor I/O. improving   - Pt eating 100% of meals.  8. Abdominal abscess: monitor drain sites, catheters out, No abdominal pain 9. Hypokalemia: KCl supplements 10. VDRF/ History of OSA: Decannulated, no complications                      11. Anxiety disorder: resumed low dose Xanax as used this for insomnia at home. given his respiratory compromise will need to limit dosing.  Improving.  12. Anemia: Likely due to infection/chronic illness. Hgb stable at 8.9 on 11 7.  Will cont to monitor.                      13. Anasarca: Improved Continue Lasix, protein supplements. Recheck prealb, Ace wraps to legs 14. Diastolic CHF: Off amiodarone, Ace and statin at this time due to recurrent hypotension and multiple medical issues. Monitor for signs of overload. Weights stable. No SOB      LOS (Days) 27 A FACE TO FACE EVALUATION  WAS PERFORMED Angline Schweigert E 09/23/2015, 8:41 AM

## 2015-09-24 ENCOUNTER — Inpatient Hospital Stay (HOSPITAL_COMMUNITY): Payer: Medicare Other | Admitting: Physical Therapy

## 2015-09-24 ENCOUNTER — Inpatient Hospital Stay (HOSPITAL_COMMUNITY): Payer: Medicare Other | Admitting: Occupational Therapy

## 2015-09-24 LAB — GLUCOSE, CAPILLARY
GLUCOSE-CAPILLARY: 118 mg/dL — AB (ref 65–99)
Glucose-Capillary: 85 mg/dL (ref 65–99)
Glucose-Capillary: 91 mg/dL (ref 65–99)
Glucose-Capillary: 106 mg/dL — ABNORMAL HIGH (ref 65–99)

## 2015-09-24 MED ORDER — PRO-STAT SUGAR FREE PO LIQD: 30.0000 mL | Freq: Three times a day (TID) | ORAL | Status: DC

## 2015-09-24 MED ORDER — TAMSULOSIN HCL 0.4 MG PO CAPS: 0.4000 mg | ORAL_CAPSULE | Freq: Every day | ORAL | Status: DC

## 2015-09-24 MED ORDER — SUCRALFATE 1 GM/10ML PO SUSP: 1.0000 g | Freq: Every day | ORAL | Status: DC

## 2015-09-24 MED ORDER — HYDROCERIN EX CREA: 1.0000 "application " | TOPICAL_CREAM | Freq: Two times a day (BID) | CUTANEOUS | Status: DC

## 2015-09-24 MED ORDER — FINASTERIDE 5 MG PO TABS: 5.0000 mg | ORAL_TABLET | Freq: Every day | ORAL | Status: DC

## 2015-09-24 MED ORDER — FAMOTIDINE 20 MG PO TABS: 20.0000 mg | ORAL_TABLET | Freq: Two times a day (BID) | ORAL | Status: DC

## 2015-09-24 MED ORDER — DICLOFENAC SODIUM 1 % TD GEL: 2.0000 g | Freq: Four times a day (QID) | TRANSDERMAL | Status: DC

## 2015-09-24 MED ORDER — ONDANSETRON HCL 4 MG PO TABS: 4.0000 mg | ORAL_TABLET | Freq: Four times a day (QID) | ORAL | Status: DC | PRN

## 2015-09-24 MED ORDER — BIOTENE DRY MOUTH MT LIQD: 15.0000 mL | Freq: Three times a day (TID) | OROMUCOSAL | Status: DC

## 2015-09-24 MED ORDER — MAGNESIUM OXIDE 400 (241.3 MG) MG PO TABS: 800.0000 mg | ORAL_TABLET | Freq: Two times a day (BID) | ORAL | Status: DC

## 2015-09-24 MED ORDER — ASCORBIC ACID 500 MG PO TABS: 500.0000 mg | ORAL_TABLET | Freq: Two times a day (BID) | ORAL | Status: DC

## 2015-09-24 MED ORDER — ADULT MULTIVITAMIN W/MINERALS CH: 1.0000 | ORAL_TABLET | Freq: Every day | ORAL | Status: DC

## 2015-09-24 MED ORDER — POTASSIUM CHLORIDE CRYS ER 20 MEQ PO TBCR
30.0000 meq | EXTENDED_RELEASE_TABLET | Freq: Two times a day (BID) | ORAL | Status: DC
  Administered 2015-09-24 – 2015-09-25 (×3): 30 meq via ORAL
  Filled 2015-09-24 (×5): qty 1

## 2015-09-24 MED ORDER — FUROSEMIDE 40 MG PO TABS: 40.0000 mg | ORAL_TABLET | Freq: Every day | ORAL | Status: DC

## 2015-09-24 MED ORDER — POTASSIUM CHLORIDE CRYS ER 15 MEQ PO TBCR: 30.0000 meq | EXTENDED_RELEASE_TABLET | Freq: Two times a day (BID) | ORAL | Status: DC

## 2015-09-24 MED ORDER — WARFARIN SODIUM 5 MG PO TABS: 5.0000 mg | ORAL_TABLET | Freq: Every day | ORAL | Status: AC

## 2015-09-24 MED ORDER — ACETAMINOPHEN 325 MG PO TABS: 650.0000 mg | ORAL_TABLET | Freq: Two times a day (BID) | ORAL | Status: DC

## 2015-09-24 MED ORDER — MUSCLE RUB 10-15 % EX CREA: 1.0000 "application " | TOPICAL_CREAM | Freq: Three times a day (TID) | CUTANEOUS | Status: DC

## 2015-09-24 NOTE — Progress Notes (Signed)
Physical Therapy Session Note  Patient Details  Name: Jonathan Le MRN: [id number] Date of Birth: 02/16/1946  Today's Date: 09/24/2015 PT Individual Time: 0900-1000 PT Individual Time Calculation (min): 60 min    Skilled Therapeutic Interventions/Progress Updates:    Pt received supine in bed with RN present; c/o pain in buttocks as below and agreeable to treatment. +2 A for scooting towards HOB with pt assisting with BUEs on rails and BLEs for pushing. Pt left upright in bed with urinal for several minutes due to pt request for privacy to urinate, however ultimately unable to void. Rolling R/L in bed to don brief, pants and shirt with modA upper body, max/totalA lower body dressing. Supine>sit with bedrails and HOB elevated with close S and verbal cues for sequencing and technique. Transfer bed>w/c with Antony SalmonStedy and +2A, with pt pulling with BUEs on rail to assist. W/c propulsion x75' with BUEs; pt given gloves to improve grip on w/c. Weighted 3# bar in BUEs with pt performing chest press and simulated canoe rowing for UE strengthening while w/c pushed remaining distance down hall by therapist. Antony SalmonStedy transfer w/c <>mat table with +2A. Unsupported sitting on edge of mat table x20 min while performing dynamic UE ball hits with weighted bar. Pt returned to w/c as above and agreeable to remain in w/c until next session in 30 min. Remained seated in w/c at completion of session with all needs in reach.   Therapy Documentation Precautions:  Precautions Precautions: Fall Precaution Comments: sacral decub, trach, drain lines from abdomen Restrictions Weight Bearing Restrictions: No Other Position/Activity Restrictions: trach collar General:   Pain: Pain Assessment Pain Assessment: 0-10 Pain Score: 5  Pain Type: Acute pain Pain Location: Buttocks Pain Orientation: Mid Pain Descriptors / Indicators: Aching Pain Onset: On-going Patients Stated Pain Goal: 2 Pain Intervention(s):  Repositioned;Emotional support Multiple Pain Sites: No   See Function Navigator for Current Functional Status.   Therapy/Group: Individual Therapy  [name] 09/24/2015, 11:27 AM

## 2015-09-24 NOTE — Progress Notes (Signed)
Social Work Patient ID: Jonathan Le, male   DOB: 07/27/1946, 69 y.o.   MRN: [id number] Spoke with wife via telephone who reports : " He will need to get over himself, he's going there." She will call [facility] back to schedule intake appointment at 10:00 am She is pleased he got a bed there, very close to their home and she visited/toured yesterday. Work toward Estate manager/land agenttransfer tomorrow.

## 2015-09-24 NOTE — Progress Notes (Signed)
   09/24/15 2351  BiPAP/CPAP/SIPAP  BiPAP/CPAP/SIPAP Pt Type Adult  Mask Type Nasal mask  BiPAP/CPAP/SIPAP CPAP  Patient Home Equipment Yes  Patient is wearing his home unit CPAP.

## 2015-09-24 NOTE — Progress Notes (Signed)
Social Work Patient ID: Ardyth HarpsHarold D Thien, male   DOB: 10/01/1946, 69 y.o.   MRN: [id number] Have sent information to [facility] per their request to see if they are able to offer pt a bed. [facility] to let this worker know.

## 2015-09-24 NOTE — Progress Notes (Signed)
Subjective/Complaints: Discussed rectal stump drainage with patient, we discussed that it may be more temporarily while he is getting the Carafate enemas and then should hopefully taper off. Discussed Lasix dose as well as potassium dose. Reviewed basic metabolic package                                                                ROS:   Denies CP, SOB, n/v/d. Strength improving. No insomnia, anxiety, appetite ok, pain controlled  Objective: Vital Signs: Blood pressure 103/60, pulse 94, temperature 98.3 F (36.8 C), temperature source Oral, resp. rate 19, height  (1.753 m), weight 112.356 kg (247 lb 11.2 oz), SpO2 98 %. No results found. Results for orders placed or performed during the hospital encounter of 08/27/15 (from the past 72 hour(s))  Glucose, capillary     Status: None   Collection Time: 09/21/15 11:20 AM  Result Value Ref Range   Glucose-Capillary 98 65 - 99 mg/dL  Glucose, capillary     Status: None   Collection Time: 09/21/15  4:35 PM  Result Value Ref Range   Glucose-Capillary 92 65 - 99 mg/dL  Glucose, capillary     Status: Abnormal   Collection Time: 09/21/15  8:51 PM  Result Value Ref Range   Glucose-Capillary 113 (H) 65 - 99 mg/dL   Comment 1 Notify RN   Glucose, capillary     Status: None   Collection Time: 09/22/15  6:52 AM  Result Value Ref Range   Glucose-Capillary 97 65 - 99 mg/dL  Glucose, capillary     Status: None   Collection Time: 09/22/15 11:17 AM  Result Value Ref Range   Glucose-Capillary 98 65 - 99 mg/dL  Glucose, capillary     Status: Abnormal   Collection Time: 09/22/15  4:29 PM  Result Value Ref Range   Glucose-Capillary 118 (H) 65 - 99 mg/dL  Glucose, capillary     Status: Abnormal   Collection Time: 09/22/15  9:02 PM  Result Value Ref Range   Glucose-Capillary 106 (H) 65 - 99 mg/dL  Protime-INR     Status: Abnormal   Collection Time: 09/23/15  4:20 AM  Result Value Ref Range   Prothrombin Time 26.9 (H) 11.6 - 15.2 seconds   INR  2.52 (H) 0.00 - 1.49  Glucose, capillary     Status: None   Collection Time: 09/23/15  6:51 AM  Result Value Ref Range   Glucose-Capillary 83 65 - 99 mg/dL  Glucose, capillary     Status: Abnormal   Collection Time: 09/23/15 12:07 PM  Result Value Ref Range   Glucose-Capillary 110 (H) 65 - 99 mg/dL  Glucose, capillary     Status: None   Collection Time: 09/23/15  4:33 PM  Result Value Ref Range   Glucose-Capillary 81 65 - 99 mg/dL  Glucose, capillary     Status: Abnormal   Collection Time: 09/23/15  9:10 PM  Result Value Ref Range   Glucose-Capillary 102 (H) 65 - 99 mg/dL  Glucose, capillary     Status: None   Collection Time: 09/24/15  7:00 AM  Result Value Ref Range   Glucose-Capillary 85 65 - 99 mg/dL    Nursing note and vitals reviewed. Constitutional: He is oriented to person, place, and time.he  is slower today He appears well-developed and well-nourished. No distress.  HENT:  Head: Normocephalic and atraumatic.  Eyes: Conjunctivae and EOM are normal.   Neck: Decreased range of motion present.  Coughs without evidence of air leakage through healing trach site Cardiovascular: Normal rate and regular rhythm. noES Respiratory: Effort normal and breath sounds normal. No respiratory distress. He has no wheezes.    GI: Soft. Bowel sounds are normal.  Non-tender. Midline incision has healed . Ostomy with bilious drainage which appears decreased. Multiple drains LLQ sutured in place.no erythema  Genitourinary:  Foley in place.  Musculoskeletal: He exhibits edema b/l LE  Right knee with effusion. No tenderness.  Skin:  Right lower shin with stasis changes.  Diffuse macular rash Neurological: He is alert and oriented to person, place, and time.    Speech clear. Follows basic commands without difficulty.  Psychiatric: He has a normal mood and affect. His speech is normal and behavior is normal. Cognition and memory are normal.  Strength Antigravity through, except  b/l hip flexors, Patient needs some assist to initiate hip flexion.   Sensation diminished in feet.    Assessment/Plan: 1. 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 Physiatrist is providing close team supervision and 24 hour management of active medical problems listed below. Physiatrist and rehab team continue to assess barriers to discharge/monitor patient progress toward functional and medical goals.  I  FIM: Function - Bathing Position: Sitting EOB Body parts bathed by patient: Right arm, Left arm, Chest, Abdomen, Left upper leg, Right upper leg, Right lower leg, Left lower leg, Front perineal area Body parts bathed by helper: Buttocks, Back Bathing not applicable: Right lower leg, Left lower leg Assist Level: Assistive device Assistive Device Comment: LH sponge and reacher  Function- Upper Body Dressing/Undressing What is the patient wearing?: Pull over shirt/dress Pull over shirt/dress - Perfomed by patient: Thread/unthread right sleeve, Thread/unthread left sleeve, Put head through opening, Pull shirt over trunk Pull over shirt/dress - Perfomed by helper: Pull shirt over trunk Assist Level: Set up Function - Lower Body Dressing/Undressing What is the patient wearing?: Pants, Non-skid slipper socks Position: Sitting EOB Underwear - Performed by helper: Pull underwear up/down Pants- Performed by patient: Thread/unthread right pants leg, Thread/unthread left pants leg Pants- Performed by helper: Pull pants up/down Non-skid slipper socks- Performed by patient: Don/doff right sock, Don/doff left sock Non-skid slipper socks- Performed by helper: Don/doff right sock, Don/doff left sock Socks - Performed by helper: Don/doff right sock, Don/doff left sock Assist for footwear: Dependant Assist for lower body  dressing: Touching or steadying assistance (Pt > 75%)  Function - Toileting Toileting activity did not occur: Safety/medical concerns Assist level: Two helpers (per [place], NT report)  Function - Archivist transfer activity did not occur: Safety/medical concerns  Function - Chair/bed transfer Chair/bed transfer activity did not occur: Safety/medical concerns Chair/bed transfer method: Other Chair/bed transfer assist level: 2 helpers Chair/bed transfer assistive device: Other Mechanical lift: Stedy Chair/bed transfer details: Manual facilitation for weight shifting, Verbal cues for safe use of DME/AE, Verbal cues for technique  Function - Locomotion: Wheelchair Will patient use wheelchair at discharge?: Yes Type: Manual Wheelchair activity did not occur: Safety/medical concerns Max wheelchair distance: 80 Assist Level: Supervision or verbal cues Wheel 50 feet with 2 turns activity did not occur: Safety/medical concerns Assist Level: Supervision or verbal cues Wheel  150 feet activity did not occur: Safety/medical concerns Turns around,maneuvers to table,bed, and toilet,negotiates 3% grade,maneuvers on rugs and over doorsills: No Function - Locomotion: Ambulation Ambulation activity did not occur: Safety/medical concerns Walk 10 feet activity did not occur: Safety/medical concerns Walk 50 feet with 2 turns activity did not occur: Safety/medical concerns Walk 150 feet activity did not occur: Safety/medical concerns Walk 10 feet on uneven surfaces activity did not occur: Safety/medical concerns  Function - Comprehension Comprehension: Auditory Comprehension assist level: Understands complex 90% of the time/cues 10% of the time  Function - Expression Expression: Verbal Expression assistive device: Talk trach valve Expression assist level: Expresses complex 90% of the time/cues < 10% of the time  Function - Social Interaction Social Interaction assist level:  Interacts appropriately 90% of the time - Needs monitoring or encouragement for participation or interaction.  Function - Problem Solving Problem solving assist level: Solves basic 25 - 49% of the time - needs direction more than half the time to initiate, plan or complete simple activities  Function - Memory Memory assist level: Recognizes or recalls 50 - 74% of the time/requires cueing 25 - 49% of the time Patient normally able to recall (first 3 days only): Current season, Location of own room, Staff names and faces, That he or she is in a hospital  1. 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    2. LLE DVT treatment /Anticoagulation: Pharmaceutical: Coumadin---therapeutic  , pharmacy protocol 3. Pain Management: Tylenol for prn for pain. Will continue oxycodone prn if needed for severe pain.Knee OA mod severe, bracing and voltaren gel, KO helping  -would benefit from scheduled oxy prior to am therapies---will schedule one hour ahead 4. Mood: LCSW to follow for evaluation and support.  5. Neuropsych: This patient is capable of making decisions on his own behalf. 6. Skin/Wound Care:  wet to dry dressing,  -  Cont air bed wound VAC removed Due to rectal stump discharge. If this symptom proves this may be reapplied and can be used in skilled nursing setting  - 7. Fluids/Electrolytes/Nutrition: Monitor I/O. improving   - Pt eating 100% of meals. , Fluid intake about 1000 cc per day 8. Abdominal abscess: monitor drain sites, catheters out, No abdominal pain 9. Hypokalemia: KCl supplements, Improved, will reduce Kay Ciel to 30 mEq twice a day as we are reducing Lasix as well 10. VDRF/ History of OSA: Decannulated, no complications                      11. Anxiety disorder: resumed low dose Xanax as used this for insomnia at home. given his respiratory compromise  will need to limit dosing.  Improving.  12. Anemia: Likely due to infection/chronic illness. Hgb stable at 8.9 on 11 7.  Will cont to monitor.                      13. Anasarca: Improved Reduce LasixTo 40 mg in the morning, protein supplements. Recheck prealb, Ace wraps to legs 14. Diastolic CHF: Off amiodarone, Ace and statin at this time due to recurrent hypotension and multiple medical issues. Monitor for signs of overload. Weights stable. No SOB    15. Status post colostomy for ischemic colitis, wound care nurse to instruct patient on changing this. Has rectal stump drainage, Carafate enemas to 5 days  LOS (Days) 28 A FACE TO FACE  EVALUATION WAS PERFORMED KIRSTEINS,ANDREW E 09/24/2015, 8:44 AM

## 2015-09-24 NOTE — Progress Notes (Signed)
Occupational Therapy Session Note  Patient Details  Name: Jonathan Le MRN: [id number] Date of Birth: 10/14/1946  Today's Date: 09/24/2015 OT Individual Time: [id number] OT Individual Time Calculation (min): 58 min    Short Term Goals: Week 4:  OT Short Term Goal 1 (Week 4): Pt will tolerate standing for 30 seconds with total assist +2 pt (30%) in order to increase endurance for LB selfcare OT Short Term Goal 2 (Week 4): Pt will transfer from supine to sit EOB with no more than min assist in preparation for selfcare tasks. OT Short Term Goal 3 (Week 4): Pt will perform LB dressing sit to supine with mod assist including pulling pants over his hips. OT Short Term Goal 4 (Week 4): Pt will increase BUE shoulder AROM to greater than 110 degrees to increase independence and efficiency with UB dressing.    Skilled Therapeutic Interventions/Progress Updates:    Session 1:  Pt performed bathing from supine to sit position.  Transferred from wheelchair to EOB using steady with total assist +2 (pt 30%).  Utilized AE for LB selfcare while sitting with return to supine for washing peri area, donning new brief and donning shorts.  Pt with attempts to urinate in bed with HOB up but unsuccessful this session.  Pt left in supine position with call button and phone within reach.    Session 2 [phone]([id number])  Pt worked on wheelchair mobility for UB strengthening during session.  He needed min assist on 2 occasions to assist with turning wheelchair as he could not coordinate holding the wheelchair with one hand while pushing with the other.  Once in the gym pt inquired about attempting to stand with a RW.  Attempted X 2 for pt to stand with RW and unsuccessful on both attempts, even with +2 assistance.  Utilized Steady lift to assist with sit to stand and standing.  Pt needing +2 (pt 30%) to complete standing and maintain for 5 seconds.  Pt still with increased trunk, cervical, and knee flexion in standing.  Pt  transported back to the room and into bed via Steady as well.  Pt left with urinal, call button, and phone within reach.     Therapy Documentation Precautions:  Precautions Precautions: Fall Precaution Comments: sacral decub, trach, drain lines from abdomen Restrictions Weight Bearing Restrictions: No Other Position/Activity Restrictions: trach collar  Pain: Pain Assessment Pain Assessment: 0-10 Faces Pain Scale: Hurts worst Pain Type: Acute pain Pain Location: Buttocks Pain Orientation: Lower Pain Intervention(s): Repositioned ADL: See Function Navigator for Current Functional Status.   Therapy/Group: Individual Therapy  Yannis Broce OTR/L 09/24/2015, 4:15 PM

## 2015-09-24 NOTE — Progress Notes (Signed)
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  Tracheostomy care ([place])  09/18/2015   .  Chronic respiratory failure ([place])  09/17/2015   .  Abscess of abdominal cavity ([place])     .  Enterococcal infection     .  Sacral decubitus ulcer     .  Hypoalbuminemia due to protein-calorie malnutrition ([place])     .  Critical illness myopathy  08/29/2015   .  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  Intra-abdominal abscess ([place])     .  Debility  08/27/2015   .  Acute on chronic respiratory failure, unspecified whether with hypoxia or hypercapnia ([place])     .  Encounter for orogastric (OG) tube placement     .  Abdominal abscess ([place])     .  Abscess     .  Encounter for central line placement     .  Encounter for intubation     .  Respiratory failure ([place])     .  Septic shock ([place])     .  Blood poisoning ([place])     .  Tracheostomy status ([place])     .  Acute respiratory failure ([place])     .  Pressure ulcer  06/21/2015   .  Shock ([place])  06/18/2015   .  Abdominal pain, generalized     .  Shortness of breath     .  Hypoxia     .  S/P CABG x 3  05/22/2015   .  Cardiomyopathy, ischemic  05/21/2015   .  CAD, multiple vessel  05/21/2015   .  Acute systolic congestive heart failure, NYHA class 3 ([place])   05/21/2015   .  Morbid obesity ([place])  05/20/2015   .  OSA on CPAP  05/20/2015   .  Other specified hypotension     .  NSTEMI (non-ST elevated myocardial infarction) ([place])  05/19/2015   .  Benign prostatic hyperplasia with urinary obstruction  04/24/2015   .  Chronic LBP  12/05/2013   .  Essential hypertension  09/04/2013     Expected Discharge Date: Expected Discharge Date: 09/24/15  Team Members Present: Physician leading conference: Dr. Alysia Penna Social Worker Present: Ovidio Kin, LCSW Nurse Present: Heather Roberts, RN PT Present: Georjean Mode, PT OT Present: Clyda Greener, OT SLP Present: Windell Moulding, SLP PPS Coordinator present : Daiva Nakayama, RN, CRRN        Current Status/Progress  Goal  Weekly Team Focus   Medical     Lurline Idol is out, Foley is out, abdominal drains are out, stage IV sacral decubiti  One-person assistance, 24 7 max care  Reduced rectal stump drainage   Bowel/Bladder     Continent of bladder. Ilelosotimy intact with liquid stool   max assist   Continue to educate care and emptying of ileostomty bag   Swallow/Nutrition/ Hydration       na         ADL's     supervision for UB selfcare sitting unsupported EOB, min assist for LB bathing and for donning pants shorts with AE, needs max assist for donning socks and for pulling pants over hips in supine,   total assist +2 for transfers using the steady.  Unable to maintain upright standing for selfcare tasks.   mod to max assist  selfcare retraining, sit to stand transitons, balance, UE strengthening, AE/DME use, pt/family education   Mobility     sits EOB x 20 min with supervision, +2 transfer with mechanical lift; 1.5 min tolerance of standing in // +2; supervision w/c x 80'; +2 sit> partial stand  supervision bed, max assist basic transfers, max assist gait x 10', supervision w/c x 100' controlled, 200' community   bed mobiltiy, pre-gait, transfers, tolerance of partial standing in [place]. awareness of body-in  -space   Communication       na         Safety/Cognition/ Behavioral Observations      no unsafe behaviors         Pain     Scheduled Tylenol 620m BID  less than 3  Offer pain medication prior to initital therapy session    Skin     Unstageable wounds to bilateral heels, Sacral wound requiring BID dressing change, mdiline abominal incision healing appropriately  no new breakdown total assist   Continue with daily dressing change, assess wounds q shift       *See Care Plan and progress notes for long and short-term goals.    Barriers to Discharge:  No medical barriers, if rectal drainage reduces, may be able to reapply wound VAC     Possible Resolutions to Barriers:   See above, pursue SNF placement     Discharge Planning/Teaching Needs:   Looking for NH bed, wife not able to provide the care pt requires at this time needs him to be more mobile       Team Discussion:    Making slow progress in therapies-standing with steady but requiring two people for safety. Pt learning colostomy care. Trach out, foley-out, drains-out. MD feels medically ready to transfer to NH. Gen surgery addressing fluid output in rectum. Knee brace helping with pain and stability.    Revisions to Treatment Plan:    NHP    Continued Need for Acute Rehabilitation Level of Care: The patient requires daily medical management by a physician with specialized training in physical medicine and rehabilitation for the following conditions: Daily direction of a multidisciplinary physical rehabilitation program to ensure safe treatment while eliciting the highest outcome that is of practical value to the patient.: Yes Daily medical management of patient stability for increased activity during participation in an intensive rehabilitation regime.: Yes Daily analysis of laboratory values and/or radiology reports with any subsequent need for medication adjustment of medical intervention for : Neurological problems;Other;Post  surgical problems  DElease Hashimoto11/08/2015, 8:44 AM                 RElease Hashimoto LCSW Social Worker Signed  Patient Care Conference 09/16/2015  1:42 PM    Expand All Collapse All   Inpatient RehabilitationTeam Conference  and Plan of Care Update Date: 09/16/2015   Time: 10:45 AM     Patient Name: Jonathan Le       Medical Record Number: [id number]  Date of Birth: 1946/02/20 Sex: Male         Room/Bed: 4W07C/4W07C-01 Payor Info: Payor: MEDICARE / Plan: MEDICARE PART A AND B / Product Type: *No Product type* /    Admitting Diagnosis: debility trach   Admit Date/Time:  08/27/2015  3:37 PM Admission Comments: No comment available   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  Abscess of abdominal cavity ([place])     .  Enterococcal infection     .  Sacral decubitus ulcer     .  Hypoalbuminemia due to protein-calorie malnutrition ([place])     .  Critical illness myopathy  08/29/2015   .  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  Intra-abdominal abscess ([place])     .  Debility  08/27/2015   .  Acute on chronic respiratory failure, unspecified whether with hypoxia or hypercapnia ([place])     .  Encounter for orogastric (OG) tube placement     .  Abdominal abscess ([place])     .  Abscess     .  Encounter for central line placement     .  Encounter for intubation     .  Respiratory failure ([place])     .  Septic shock ([place])     .  Blood poisoning ([place])     .  Tracheostomy status ([place])     .  Acute respiratory failure ([place])     .  Pressure ulcer  06/21/2015   .  Shock ([place])  06/18/2015   .  Abdominal pain, generalized     .  Shortness of breath     .  Hypoxia     .  S/P CABG x 3  05/22/2015   .  Cardiomyopathy, ischemic  05/21/2015   .  CAD, multiple vessel  05/21/2015   .  Acute systolic congestive heart failure, NYHA class 3 ([place])  05/21/2015   .  Morbid  obesity ([place])  05/20/2015   .  OSA on CPAP  05/20/2015   .  Other specified hypotension     .  NSTEMI (non-ST elevated myocardial infarction) ([place])  05/19/2015   .  Benign prostatic hyperplasia with urinary obstruction  04/24/2015   .  Chronic LBP  12/05/2013   .  Essential hypertension  09/04/2013     Expected Discharge Date: Expected Discharge Date: 09/24/15  Team Members Present: Physician leading conference: Dr. Alysia Penna Social Worker Present: Ovidio Kin, LCSW Nurse Present: Dorien Chihuahua, RN PT Present: Raylene Everts, PT OT Present: Clyda Greener, OT SLP Present: Windell Moulding, SLP PPS Coordinator present : Daiva Nakayama, RN, CRRN        Current Status/Progress  Goal  Weekly Team Focus   Medical     Now voiding without foley, wound vac to be placed today, possible decannulation, urinary inc  one person assist  wound vac, urinary cont   Bowel/Bladder     occassional incontinence attempts to use male urinal due to anatomy . PVRs low, WNL ilelostomy intact draining liquid stool.   max assist   educate on emptying ileostomy and changing wafer and bag . assist with postitioning urinal .  Swallow/Nutrition/ Hydration       na         ADL's     set up for UB self care (occasional min A with shirt) sitting EOB; LB self care from bed using rolling.  Improved activity tolerance, transfer skils with slide board, general strength   overall min to mod assist  ADL retraining, functional mobility with transfers, sit to stand, pt/family education   Mobility     mod assist bed mobility, +2 for mechanical lift or inconsistent mod> +2 for slide board; sitting tolerance in w/c up to 4 hours; supervision w/c x 100'; tolerates tilt table at 70 degrees; tolerates standing in parallel bars x 30 seconds  supervision bed mobility; mod assist basic and car transfers, mod assist gait x 50' with therapists only; supervision w/c propulsion x 150' controlled, 75' home env, and min assist  x 200'  community setting  bed mobility, pre-gait, transfers, tolerance of standing, teaching awareness of body-in-space, hospital ex program with assist from sister   Communication       na         Safety/Cognition/ Behavioral Observations      no unsafe behaviors         Pain     sacral wound . oxycodone 10 mg prn prior to hydrotherapy  less than 3  assess pain management    Skin     bil. heels unstageable wounds. right > than left heel. left JP drains sites , sacral wound with hydrotherapy trach site .midline incision healing pink approximated no dressing   no new breakdown total assist   change from hydrotherapy to wound VAC.        *See Care Plan and progress notes for long and short-term goals.    Barriers to Discharge:  needs to use CPAP     Possible Resolutions to Barriers:   cont above, stand to void     Discharge Planning/Teaching Needs:   Pt making progress, would like trach removed before DC, wife not assisting pt at DC. Will probably be NHP       Team Discussion:    Improved activity tolerance and making slow gain-stood for 30 seconds in the parallel bars. DC-SP met goals. Wound Vac to be placed today-off antibiotics. Pulmonary looking at trach-needs to wear CPAP at night. R-knee pain limiting-needs knee support. Foley removed and voiding-some incontinence. Begin NHP search   Revisions to Treatment Plan:    DC-SP due to met goals/ begin NH search    Continued Need for Acute Rehabilitation Level of Care: The patient requires daily medical management by a physician with specialized training in physical medicine and rehabilitation for the following conditions: Daily direction of a multidisciplinary physical rehabilitation program to ensure safe treatment while eliciting the highest outcome that is of practical value to the patient.: Yes Daily medical management of patient stability for increased activity during participation in an intensive rehabilitation regime.: Yes Daily  analysis of laboratory values and/or radiology reports with any subsequent need for medication adjustment of medical intervention for : Neurological problems;Other;Post surgical problems;Pulmonary problems;Cardiac problems  [name] 09/16/2015, 1:42 PM                 [name], LCSW Social Worker Signed  Patient Care Conference 09/09/2015  1:58 PM    Expand All Collapse All   Inpatient RehabilitationTeam Conference and Plan of Care Update Date: 09/09/2015   Time: 10:50 AM     Patient  Name: Jonathan Le       Medical Record Number: [id number]  Date of Birth: 1946-03-27 Sex: Male         Room/Bed: 4W07C/4W07C-01 Payor Info: Payor: MEDICARE / Plan: MEDICARE PART A AND B / Product Type: *No Product type* /    Admitting Diagnosis: debility trach   Admit Date/Time:  08/27/2015  3:37 PM Admission Comments: No comment available   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  Abscess of abdominal cavity ([place])     .  Enterococcal infection     .  Sacral decubitus ulcer     .  Hypoalbuminemia due to protein-calorie malnutrition ([place])     .  Critical illness myopathy  08/29/2015   .  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  Intra-abdominal abscess ([place])     .  Debility  08/27/2015   .  Acute on chronic respiratory failure, unspecified whether with hypoxia or hypercapnia ([place])     .  Encounter for orogastric (OG) tube placement     .  Abdominal abscess ([place])     .  Abscess     .  Encounter for central line placement     .  Encounter for intubation     .  Respiratory failure ([place])     .  Septic shock ([place])     .  Blood poisoning ([place])     .  Tracheostomy status ([place])     .  Acute respiratory failure ([place])     .  Pressure ulcer  06/21/2015   .  Shock ([place])  06/18/2015   .  Abdominal pain, generalized     .  Shortness of breath     .  Hypoxia      .  S/P CABG x 3  05/22/2015   .  Cardiomyopathy, ischemic  05/21/2015   .  CAD, multiple vessel  05/21/2015   .  Acute systolic congestive heart failure, NYHA class 3 ([place])  05/21/2015   .  Morbid obesity ([place])  05/20/2015   .  OSA on CPAP  05/20/2015   .  Other specified hypotension     .  NSTEMI (non-ST elevated myocardial infarction) ([place])  05/19/2015   .  Benign prostatic hyperplasia with urinary obstruction  04/24/2015   .  Chronic LBP  12/05/2013   .  Essential hypertension  09/04/2013     Expected Discharge Date: Expected Discharge Date: 09/24/15  Team Members Present: Physician leading conference: Dr. Alysia Penna Social Worker Present: Ovidio Kin, LCSW PT Present: Georjean Mode, PT OT Present: Clyda Greener, OT SLP Present: Windell Moulding, SLP PPS Coordinator present : Daiva Nakayama, RN, CRRN        Current Status/Progress  Goal  Weekly Team Focus   Medical     maximove transfers unchanged, some increased movement in LE, severe trunkal weakness  On e person assist mobility  ongoing wound care   Bowel/Bladder     Foley catheter,ileostomy with out complicacions.  To keep ileostomy working properly with mod. assisst.   Assess ostomy and foley cath. Q shift to prevent complicacions.   Swallow/Nutrition/ Hydration       na         ADL's     Min assist for UB bathing and dressing, max assist for LB sit to supine.  Pt still not able to tolerate standing for LB selfcare or transfers.  Uses AE for LB selfcare tasks as well.  BUE strength and shoulder weakness.  tolerating sitting much better than last week as well as OOB.  Still with slow progress however  overall min to mod assist  selfcare re-training, transfer training, pt/family education, balance re-training, increasing OOB tolerance.      Mobility     mod assist bed mobility; +2 for mechanical lift or MaxiSlide in supine to tilt tabel; tolerance of 70 degrees on tilt table; sitting tolerance in w/c x 1.5 hours   supervision bed mobility; mod assist basic and car transfers, mod assist gait x 50' with therapists only; supervision w/c propulsion x 150' controlled, 75' home env, and min assist  x 200' community setting  bed mobility, pre-gait, transfers, tolerance of upright sitting EOB or in w/c, family ed   Communication     Tolerating PMSV during all waking hours, can donn/doff valve with supervision-mod I, education is complete, recommend d/c speaking valve goals          Safety/Cognition/ Behavioral Observations    Consistently improved mentation, improved recall for daily information, improved functional problem solving, improved attention to tasks and awareness of deficits, quickly approaching LTG  supervision   completion of education and potential discharge from [facility] by the end of this week    Pain     No complain of pain.  To keep pain levels less than 3. On scale 1 to 10.  To assess for pain levels Q 2-3 hrs.    Skin     Multiple wounds:unstagable to heel and sacrum with hydrotherapy. Midline incision with daily dressing change.  No further skin breakdown or infection with max. assisst.   Assess skin Q shift and PRN.Continue dressing changes as per orders.      *See Care Plan and progress notes for long and short-term goals.    Barriers to Discharge:  severe mobility deficits     Possible Resolutions to Barriers:   Will need another facility post D/C      Discharge Planning/Teaching Needs:   Pt making good progress but would need to be walking before wife would take him home. Sister here daily but not able to provide care to pt.         Team Discussion:    Making very slow progress and no FIM change. Sacral wound better-with daily hydro therapy. PT using tilt table for standing exercise. Severe neuropathy limits pt in his therapies. Needs to be encouraged to participate and stay out fo bed .   Revisions to Treatment Plan:    Probable LTACH versus NHP    Continued Need for Acute  Rehabilitation Level of Care: The patient requires daily medical management by a physician with specialized training in physical medicine and rehabilitation for the following conditions: Daily direction of a multidisciplinary physical rehabilitation program to ensure safe treatment while eliciting the highest outcome that is of practical value to the patient.: Yes Daily medical management of patient stability for increased activity during participation in an intensive rehabilitation regime.: Yes Daily analysis of laboratory values and/or radiology reports with any subsequent need for medication adjustment of medical intervention for : Neurological problems;Post surgical problems;Pulmonary problems  Atisha Hamidi, [place] Rhyme 09/09/2015, 1:58 PM                 [name], [place] Worker Signed  Patient Care Conference 09/02/2015  1:11 PM  Expand All Collapse All   Inpatient RehabilitationTeam Conference and Plan of Care Update Date: 09/02/2015   Time: 10:45 AM     Patient Name: Jonathan Le       Medical Record Number: [id number]  Date of Birth: 1945-12-31 Sex: Male         Room/Bed: 4W07C/4W07C-01 Payor Info: Payor: MEDICARE / Plan: MEDICARE PART A AND B / Product Type: *No Product type* /    Admitting Diagnosis: debility trach   Admit Date/Time:  08/27/2015  3:37 PM Admission Comments: No comment available   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  Abscess of abdominal cavity ([place])     .  Enterococcal infection     .  Sacral decubitus ulcer     .  Hypoalbuminemia due to protein-calorie malnutrition ([place])     .  Critical illness myopathy  08/29/2015   .  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  Intra-abdominal abscess ([place])     .  Debility  08/27/2015   .  Acute on chronic respiratory failure, unspecified whether with hypoxia or hypercapnia ([place])      .  Encounter for orogastric (OG) tube placement     .  Abdominal abscess ([place])     .  Abscess     .  Encounter for central line placement     .  Encounter for intubation     .  Respiratory failure ([place])     .  Septic shock ([place])     .  Blood poisoning ([place])     .  Tracheostomy status ([place])     .  Acute respiratory failure ([place])     .  Pressure ulcer  06/21/2015   .  Shock ([place])  06/18/2015   .  Abdominal pain, generalized     .  Shortness of breath     .  Hypoxia     .  S/P CABG x 3  05/22/2015   .  Cardiomyopathy, ischemic  05/21/2015   .  CAD, multiple vessel  05/21/2015   .  Acute systolic congestive heart failure, NYHA class 3 ([place])  05/21/2015   .  Morbid obesity ([place])  05/20/2015   .  OSA on CPAP  05/20/2015   .  Other specified hypotension     .  NSTEMI (non-ST elevated myocardial infarction) ([place])  05/19/2015   .  Benign prostatic hyperplasia with urinary obstruction  04/24/2015   .  Chronic LBP  12/05/2013   .  Essential hypertension  09/04/2013     Expected Discharge Date: Expected Discharge Date: 09/24/15  Team Members Present: Physician leading conference: Dr. Alysia Penna Social Worker Present: Ovidio Kin, LCSW Nurse Present: Dorien Chihuahua, RN PT Present: Other (comment) Demetria Pore) OT Present: Clyda Greener, OT SLP Present: Windell Moulding, SLP PPS Coordinator present : Daiva Nakayama, RN, CRRN        Current Status/Progress  Goal  Weekly Team Focus   Medical     Anxiety with movement. Severe lower extremity and moderately severe upper extremity weakness due to critical illness mmyopathy and neuropathy   One-person assist  Increase activity tolerance  including upright, tilted    Bowel/Bladder     Foley- unstageable wound to sacrum; ileostomy- good output   Total assist  Assess ostomy for potential problems  Swallow/Nutrition/ Hydration       na         ADL's     min assist for UB bathing, mod assist for UB dressing.  He needs mod assist for  rolling in bed during LB selfcare with total assist for LB bathing and dressing.  Max to total assist for supine to sit EOB.  Have not attempted OOB activity secondary to pa having too much pain in his buttocks in sitting.    overall min to mod assist  selfcare re-training, transfer training, pt/family education, balance re-training, increasing OOB tolerance   Mobility     +2 for bed mobilty, lateral slide transfer on MaxiSlide to tilt table; tolerance x 1 minute of 30 degrees upright on tilt table   supervision bed mobility; mod assist basic and car transfers, mod assist gait x 50' with therapists only; supervision w/c propulsion x 150' controlled, 75' home env, and min assist  x 200' community setting  bed mobilty, pre gait, transfers, tolerance of upright position on tilt table, pt ed, tolerance of sitting upright in w/c   Communication     tolerating [place] speaking valve during all waking hours but needs cues for recall of care and cleaning of valve, donning and doffing   mod I   continue education    Safety/Cognition/ Behavioral Observations    fluctuates, varies between supervision to max assist depending on medication effects and lethargy   supervision   continue to address basic to semi-complex problem solving     Pain     Denies pain         Skin     Multiple wounds; unstageable to heel, sacrum-hydrotherapy and santyl in use; midline incision to abdomen with hydrogel   No further skin breakdown or infection with max assist.   Assess skin q shift and prn; perform dressing changes per MD order      *See Care Plan and progress notes for long and short-term goals.    Barriers to Discharge:  Patient fears  movement complaints of pain with movement in multiple areas     Possible Resolutions to Barriers:   Voltaren gel for knee pain     Discharge Planning/Teaching Needs:   Discharge plan is dependent upon pt's progress here, wife can not do much physical care for pt. Sister is  supportive but can not assist. Probable plan is NHP       Team Discussion:    Goals-min/mod level, extremely deconditioned-difficulty sitting up. Daily hydrotherapy for sacral wound. Pulmonary following needs to be stronger before discharging trach. Knee pain limits him. Activity tolerance poor working up this. Would benefit from neuro-psych seeing. Tilt-table tried pt didn't like.   Revisions to Treatment Plan:    None    Continued Need for Acute Rehabilitation Level of Care: The patient requires daily medical management by a physician with specialized training in physical medicine and rehabilitation for the following conditions: Daily direction of a multidisciplinary physical rehabilitation program to ensure safe treatment while eliciting the highest outcome that is of practical value to the patient.: Yes Daily medical management of patient stability for increased activity during participation in an intensive rehabilitation regime.: Yes Daily analysis of laboratory values and/or radiology reports with any subsequent need for medication adjustment of medical intervention for : Neurological problems;Other;Post surgical problems  [name] 09/02/2015, 1:11 PM                  Patient ID:  Eddie Candle, male   DOB: Nov 17, 1945, 69 y.o.   MRN: [id number]

## 2015-09-24 NOTE — Progress Notes (Signed)
Social Work Patient ID: Jonathan Le, male   DOB: 1945/12/06, 69 y.o.   MRN: [id number] Met with pt to inform him [facility] has offered a bed for tomorrow, now he is saying: " I don't know if I want to go there."  This is one of the facilities wife has asked to pursue and pt was in agreement With it. Unsure reason he is back [facility] can not meet his needs. Have left a message for his wife and awaiting return call. Pt is aware he is unable to stay here and is medically ready to go to The next venue of care. PICC line to be removed today or tomorrow am. Will work toward discharge tomorrow.

## 2015-09-24 NOTE — Progress Notes (Signed)
Social Work Patient ID: Jonathan Le, male   DOB: 1946-08-20, 69 y.o.   MRN: [id number] Met again with pt who has spoken with his wife and both have decided he is going to [facility] tomorrow. Pt states: " She said I need to go and there's no choice in the matter." He is relieved to know he will be going into a private room. Will see in am, aware he will be going after lunch tomorrow. Offered support to pt and listened to his concerns and questions.

## 2015-09-25 ENCOUNTER — Inpatient Hospital Stay (HOSPITAL_COMMUNITY): Payer: Medicare Other | Admitting: *Deleted

## 2015-09-25 ENCOUNTER — Inpatient Hospital Stay (HOSPITAL_COMMUNITY): Payer: Medicare Other | Admitting: Occupational Therapy

## 2015-09-25 DIAGNOSIS — I5042 Chronic combined systolic (congestive) and diastolic (congestive) heart failure: Secondary | ICD-10-CM

## 2015-09-25 HISTORY — DX: Chronic combined systolic (congestive) and diastolic (congestive) heart failure: I50.42

## 2015-09-25 LAB — GLUCOSE, CAPILLARY
GLUCOSE-CAPILLARY: 89 mg/dL (ref 65–99)
GLUCOSE-CAPILLARY: 98 mg/dL (ref 65–99)

## 2015-09-25 LAB — PROTIME-INR
INR: 2.59 — AB (ref 0.00–1.49)
Prothrombin Time: 27.4 seconds — ABNORMAL HIGH (ref 11.6–15.2)

## 2015-09-25 LAB — BASIC METABOLIC PANEL
Anion gap: 9 (ref 5–15)
BUN: 8 mg/dL (ref 6–20)
CO2: 26 mmol/L (ref 22–32)
CREATININE: 0.41 mg/dL — AB (ref 0.61–1.24)
Calcium: 8.5 mg/dL — ABNORMAL LOW (ref 8.9–10.3)
Chloride: 103 mmol/L (ref 101–111)
GFR calc Af Amer: >60 mL/min (ref >60)
GLUCOSE: 89 mg/dL (ref 65–99)
Potassium: 3.8 mmol/L (ref 3.5–5.1)
SODIUM: 138 mmol/L (ref 135–145)

## 2015-09-25 NOTE — Progress Notes (Signed)
Physical Therapy Discharge Summary  Patient Details  Name: Jonathan Le MRN: [id number] Date of Birth: 06-19-1946  Today's Date: 09/25/2015 PT Individual Time: 1110-1210 PT Individual Time Calculation (min): 60 min    Patient has met 4 of 7 long term goals due to improved activity tolerance, improved balance, improved postural control, increased strength, decreased pain and ability to compensate for deficits.  Patient to discharge at a wheelchair level Supervision for propulsion, but +2 needed for transfers with [facility] Patient's care partner is independent to provide the necessary physical assistance at discharge at [facility] for further rehab.   Reasons goals not met: Pt did not meet standing balance, transfers, or gait goals due to ongoing impairments in general strength, activity tolerance, and postural control. Pt has not been able to walk at time of DC, but was able to tolerate 3 min in standing frame with careful attention to colostomy and sacral sites. Pt has made great progress in activity tolerance and sitting balance, as well as strength for bed mobility.   Recommendation:  Patient will benefit from ongoing skilled PT services in skilled nursing facility setting to continue to advance safe functional mobility, address ongoing impairments in strength, cardiorespiratory endurance, balance, trunk control, knee pain, and minimize fall risk.  Equipment: No equipment provided  - defer to next venue of care, likely will need WC at D/C. Reasons for discharge: lack of progress toward goals and discharge from hospital  Patient/family agrees with progress made and goals achieved: Yes   Skilled PT focused on functional mobility trainnig, therex, and standing frame for activity tolerance and postural control. Pt instructed in supine therex including each of the following bil x10 with cues for technique: ankle pumps, quad sets, glute sets, hip ADD, and SAQ. Pt still limited by knee pain. Pt  instructed in seated balance tasks at EOB and unsupported in [facility], educated on importance of work on postural control and core strengthening. Pt was able to propel WC x150' today with cues for efficiency. Sit<>stands performed x3 with Stedy and +2 for hip lifting and max cues for technique, and postural control. Pt performed standing frame for first time today with great success! He was very pleased with this treatment, being very careful of his colostomy and sacral issues. Pt left up in [facility] for lunch with all needs in reach.   PT Discharge Precautions/Restrictions Precautions Precautions: Fall Precaution Comments: sacral decub, drain lines from abdomen; knee pain Required Braces or Orthoses: Other Brace/Splint (Supportive knee brace on L) Other Brace/Splint: hinged knee brace on the RLE for comfort Restrictions Weight Bearing Restrictions: No Vital Signs Therapy Vitals Pulse Rate: 94 BP: 114/65 mmHg Patient Position (if appropriate): Sitting Pain none   Vision/Perception  Vision - History Baseline Vision: Wears glasses only for reading Patient Visual Report: No change from baseline Vision - Assessment Eye Alignment: Within Functional Limits Perception Perception: Within Functional Limits  Cognition Overall Cognitive Status: Within Functional Limits for tasks assessed Arousal/Alertness: Awake/alert Orientation Level: Oriented X4 Attention: Selective Selective Attention: Appears intact Memory: Appears intact Memory Impairment: Decreased recall of new information;Decreased short term memory Decreased Short Term Memory: Functional basic;Functional complex Awareness: Impaired Awareness Impairment: Anticipatory impairment Problem Solving: Impaired Problem Solving Impairment: Functional basic Safety/Judgment: Appears intact Comments: Pt at times with decreased carryover from session to session.  Needs step by step cueing to sequence tasks as he demonstrates some "learned helplessness".   Pt also with unrealistic expectations of progress in relation to time. Sensation Sensation Light Touch: Impaired  Detail Light Touch Impaired Details: Impaired LLE Stereognosis: Appears Intact (in BUEs) Hot/Cold: Appears Intact (In BUEs) Proprioception: Appears Intact (In BUEs) Coordination Gross Motor Movements are Fluid and Coordinated: No (Limited by strength impairment and body habitus) Fine Motor Movements are Fluid and Coordinated: Yes Heel Shin Test: NT Motor  Motor Motor: Abnormal postural alignment and control Motor - Skilled Clinical Observations: generalized weakness, poor postural control, abnormal postural alignment Motor - Discharge Observations: Pt coninues to require UE support to compensate for poor trunk control and core strength.   Mobility Bed Mobility Bed Mobility: Rolling Right;Rolling Left;Supine to Sit;Right Sidelying to Sit Rolling Right: 4: Min assist;With rail Rolling Left: 4: Min assist;With rail Right Sidelying to Sit: 5: Supervision;HOB elevated Right Sidelying to Sit Details (indicate cue type and reason): S with HOB elevlated Sit to Supine: 5: Supervision Sit to Supine - Details: Verbal cues for sequencing;Verbal cues for technique;Visual cues for safe use of DME/AE Sit to Supine - Details (indicate cue type and reason): With rails and sufficient roll, pt able to use UEs to push Up to sit Transfers Transfers: Yes Transfer via Lift Equipment: Medical sales representative Ambulation: No Stairs / Additional Locomotion Stairs: No Architect: Yes Wheelchair Assistance: 5: Careers information officer: Both upper extremities Wheelchair Parts Management: Needs assistance Distance: 150  Trunk/Postural Assessment  Cervical Assessment Cervical Assessment: Within Scientist, physiological Assessment: Exceptions to [facility] (kyphotic) Lumbar Assessment Lumbar Assessment: Exceptions to [facility] (decreased  lordosis, posterior pelvic tilt) Postural Control Postural Control: Deficits on evaluation Trunk Control: Pt is able to maintain dynamic sitting balance Mod I. However pt needs more assist when on Stedy for trunk control due to forward flexed posture.   Balance Balance Balance Assessed: Yes Static Sitting Balance Static Sitting - Balance Support: Feet supported;Right upper extremity supported Static Sitting - Level of Assistance: 7: Independent Dynamic Sitting Balance Dynamic Sitting - Balance Support: Right upper extremity supported;Left upper extremity supported;Feet supported;During functional activity Dynamic Sitting - Level of Assistance: 6: Modified independent (Device/Increase time) Static Standing Balance Static Standing - Balance Support: Bilateral upper extremity supported;During functional activity Static Standing - Level of Assistance: 1: +2 Total assist;Patient percentage (comment) Extremity Assessment  RUE Assessment RUE Assessment: Exceptions to [facility] RUE Strength RUE Overall Strength Comments: AROM shoulder flexion 0-100 degrees with pt moving into abduction and not able to maintain shoulder flexion.  increased crepitus also noted at well in the shoulder.  Elbow strength 3+/5, grip strength 3+/5 as well. LUE Assessment LUE Assessment: Exceptions to [facility] LUE Strength LUE Overall Strength Comments: AROM shoulder flexion 0-120 degrees, elbow strength and grip strength 3+/5.   RLE Assessment RLE Assessment: Exceptions to [facility] (ROM limited by edema/pain, grossly 3+/5 to 4-/5 throughout, ) LLE Assessment LLE Assessment: Exceptions to [facility] (ROM limited by edema/pain, grossly 3+/5 to 4-/5 throughout, )   See Function Navigator for Current Functional Status.   Kennieth Rad, PT, DPT  09/25/2015, 1:40 PM

## 2015-09-25 NOTE — Progress Notes (Signed)
Subjective/Complaints:  No problems overnite                                                                ROS:   Denies CP, SOB, n/v/d. Strength improving. No insomnia, anxiety, appetite ok, pain controlled  Objective: Vital Signs: Blood pressure 100/59, pulse 84, temperature 97.9 F (36.6 C), temperature source Oral, resp. rate 19, height '5\' 9"'  (1.753 m), weight 111.403 kg (245 lb 9.6 oz), SpO2 98 %. No results found. Results for orders placed or performed during the hospital encounter of 08/27/15 (from the past 72 hour(s))  Glucose, capillary     Status: None   Collection Time: 09/22/15 11:17 AM  Result Value Ref Range   Glucose-Capillary 98 65 - 99 mg/dL  Glucose, capillary     Status: Abnormal   Collection Time: 09/22/15  4:29 PM  Result Value Ref Range   Glucose-Capillary 118 (H) 65 - 99 mg/dL  Glucose, capillary     Status: Abnormal   Collection Time: 09/22/15  9:02 PM  Result Value Ref Range   Glucose-Capillary 106 (H) 65 - 99 mg/dL  Protime-INR     Status: Abnormal   Collection Time: 09/23/15  4:20 AM  Result Value Ref Range   Prothrombin Time 26.9 (H) 11.6 - 15.2 seconds   INR 2.52 (H) 0.00 - 1.49  Glucose, capillary     Status: None   Collection Time: 09/23/15  6:51 AM  Result Value Ref Range   Glucose-Capillary 83 65 - 99 mg/dL  Glucose, capillary     Status: Abnormal   Collection Time: 09/23/15 12:07 PM  Result Value Ref Range   Glucose-Capillary 110 (H) 65 - 99 mg/dL  Glucose, capillary     Status: None   Collection Time: 09/23/15  4:33 PM  Result Value Ref Range   Glucose-Capillary 81 65 - 99 mg/dL  Glucose, capillary     Status: Abnormal   Collection Time: 09/23/15  9:10 PM  Result Value Ref Range   Glucose-Capillary 102 (H) 65 - 99 mg/dL  Glucose, capillary     Status: None   Collection Time: 09/24/15  7:00 AM  Result Value Ref Range   Glucose-Capillary 85 65 - 99 mg/dL  Glucose, capillary     Status: Abnormal   Collection Time: 09/24/15 11:38 AM   Result Value Ref Range   Glucose-Capillary 118 (H) 65 - 99 mg/dL  Glucose, capillary     Status: None   Collection Time: 09/24/15  4:53 PM  Result Value Ref Range   Glucose-Capillary 91 65 - 99 mg/dL  Glucose, capillary     Status: Abnormal   Collection Time: 09/24/15  9:04 PM  Result Value Ref Range   Glucose-Capillary 106 (H) 65 - 99 mg/dL  Protime-INR     Status: Abnormal   Collection Time: 09/25/15  4:35 AM  Result Value Ref Range   Prothrombin Time 27.4 (H) 11.6 - 15.2 seconds   INR 2.59 (H) 0.00 - 1.61  Basic metabolic panel     Status: Abnormal   Collection Time: 09/25/15  4:35 AM  Result Value Ref Range   Sodium 138 135 - 145 mmol/L   Potassium 3.8 3.5 - 5.1 mmol/L   Chloride 103 101 - 111 mmol/L  CO2 26 22 - 32 mmol/L   Glucose, Bld 89 65 - 99 mg/dL   BUN 8 6 - 20 mg/dL   Creatinine, Ser 0.41 (L) 0.61 - 1.24 mg/dL   Calcium 8.5 (L) 8.9 - 10.3 mg/dL   GFR calc non Af Amer >60 >60 mL/min   GFR calc Af Amer >60 >60 mL/min    Comment: (NOTE) The eGFR has been calculated using the CKD EPI equation. This calculation has not been validated in all clinical situations. eGFR's persistently <60 mL/min signify possible Chronic Kidney Disease.    Anion gap 9 5 - 15  Glucose, capillary     Status: None   Collection Time: 09/25/15  6:38 AM  Result Value Ref Range   Glucose-Capillary 89 65 - 99 mg/dL    Nursing note and vitals reviewed. Constitutional: He is oriented to person, place, and time.he is slower today He appears well-developed and well-nourished. No distress.  HENT:  Head: Normocephalic and atraumatic.  Eyes: Conjunctivae and EOM are normal.    Psychiatric: He has a normal mood and affect. His speech is normal and behavior is normal. Cognition and memory are normal.  Strength Antigravity through, except b/l hip flexors, Patient needs some assist to initiate hip flexion.   Sensation diminished in feet.    Assessment/Plan: 1. 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 Stable to D/C to SNF See D/C summary I  FIM: Function - Bathing Position: Sitting EOB Body parts bathed by patient: Right arm, Left arm, Chest, Abdomen, Left upper leg, Right upper leg, Right lower leg, Left lower leg, Front perineal area Body parts bathed by helper: Buttocks, Back Bathing not applicable: Right lower leg, Left lower leg Assist Level: Assistive device Assistive Device Comment: LH sponge and reacher  Function- Upper Body Dressing/Undressing What is the patient wearing?: Pull over shirt/dress Pull over shirt/dress - Perfomed by patient: Thread/unthread right sleeve, Thread/unthread left sleeve, Put head through opening, Pull shirt over trunk Pull over shirt/dress - Perfomed by helper: Pull shirt over trunk Assist Level: Set up Set up : To obtain clothing/put away Function - Lower Body Dressing/Undressing What is the patient wearing?: Non-skid slipper socks, Pants Position: Sitting EOB Underwear - Performed by helper: Pull underwear up/down Pants- Performed by patient: Thread/unthread right pants leg, Thread/unthread left pants leg Pants- Performed by helper: Thread/unthread right pants leg, Thread/unthread left pants leg, Pull pants up/down Non-skid slipper socks- Performed by patient: Don/doff right sock, Don/doff left sock Non-skid slipper socks- Performed by helper: Don/doff right sock, Don/doff left sock Socks - Performed by helper: Don/doff right sock, Don/doff left sock Assist for footwear: Dependant Assist for lower body dressing: Touching or steadying assistance (Pt > 75%)  Function - Toileting Toileting activity did not occur: Safety/medical concerns Assist level: Two helpers (per [place], NT report)  Function - Air cabin crew transfer activity did not occur: Safety/medical concerns  Function - Chair/bed transfer Chair/bed transfer activity did not occur: Safety/medical  concerns Chair/bed transfer method: Other (Stedy) Chair/bed transfer assist level: 2 helpers Chair/bed transfer assistive device: Other Mechanical lift: Stedy Chair/bed transfer details: Manual facilitation for weight shifting, Verbal cues for safe use of DME/AE, Verbal cues for technique  Function - Locomotion: Wheelchair Will patient use wheelchair at discharge?: Yes Type: Manual Wheelchair activity did not occur: Safety/medical concerns Max wheelchair distance: 75 Assist Level: Supervision or verbal cues Wheel 50 feet with 2 turns activity did not occur: Safety/medical concerns Assist Level: Supervision or verbal  cues Wheel 150 feet activity did not occur: Safety/medical concerns Turns around,maneuvers to table,bed, and toilet,negotiates 3% grade,maneuvers on rugs and over doorsills: No Function - Locomotion: Ambulation Ambulation activity did not occur: Safety/medical concerns Walk 10 feet activity did not occur: Safety/medical concerns Walk 50 feet with 2 turns activity did not occur: Safety/medical concerns Walk 150 feet activity did not occur: Safety/medical concerns Walk 10 feet on uneven surfaces activity did not occur: Safety/medical concerns  Function - Comprehension Comprehension: Auditory Comprehension assist level: Understands complex 90% of the time/cues 10% of the time  Function - Expression Expression: Verbal Expression assistive device: Talk trach valve Expression assist level: Expresses basic needs/ideas: With no assist  Function - Social Interaction Social Interaction assist level: Interacts appropriately 90% of the time - Needs monitoring or encouragement for participation or interaction.  Function - Problem Solving Problem solving assist level: Solves basic 25 - 49% of the time - needs direction more than half the time to initiate, plan or complete simple activities  Function - Memory Memory assist level: Recognizes or recalls 50 - 74% of the  time/requires cueing 25 - 49% of the time Patient normally able to recall (first 3 days only): Current season, Location of own room, Staff names and faces, That he or she is in a hospital  1. 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    2. LLE DVT treatment /Anticoagulation: Pharmaceutical: Coumadin---therapeutic  , pharmacy protocol 3. Pain Management: Tylenol for prn for pain. Will continue oxycodone prn if needed for severe pain.Knee OA mod severe, bracing and voltaren gel, KO helping  -would benefit from scheduled oxy prior to am therapies---will schedule one hour ahead 4. Mood: LCSW to follow for evaluation and support.  5. Neuropsych: This patient is capable of making decisions on his own behalf. 6. Skin/Wound Care:  wet to dry dressing,  -  Cont air bed wound VAC removed Due to rectal stump discharge. If this symptom proves this may be reapplied and can be used in skilled nursing setting  - 7. Fluids/Electrolytes/Nutrition: Monitor I/O. improving   - Pt eating 100% of meals. , Fluid intake about 1000 cc per day 8. Abdominal abscess: monitor drain sites, catheters out, No abdominal pain 9. Hypokalemia: KCl supplements, Improved, will reduce Kay Ciel to 30 mEq twice a day as we are reducing Lasix as well 10. VDRF/ History of OSA: Decannulated, no complications                      11. Anxiety disorder: resumed low dose Xanax as used this for insomnia at home. given his respiratory compromise will need to limit dosing.  Improving.  12. Anemia: Likely due to infection/chronic illness. Hgb stable at 8.9 on 11 7.  Will cont to monitor.                      13. Anasarca: Improved Reduce LasixTo 40 mg in the morning, protein supplements. Recheck prealb, Ace wraps to legs 14. Diastolic CHF: Off amiodarone, Ace and statin at this time due to recurrent hypotension  and multiple medical issues. Monitor for signs of overload. Weights stable. No SOBSeS    15. Status post colostomy for ischemic colitis, wound care nurse to instruct patient on changing this. Has rectal stump drainage, Carafate enemas to 5 days  LOS (Days) 29 A FACE TO FACE EVALUATION WAS PERFORMED  Alysia Penna E 09/25/2015, 7:08 AM

## 2015-09-25 NOTE — Progress Notes (Signed)
Social Work  Discharge Note  The overall goal for the admission was met for:   Discharge location: [facility]  Length of Stay: Yes-29 days  Discharge activity level: Yes-MOD/MAX ASSIST  Home/community participation: Yes  Services provided included: MD, RD, PT, OT, SLP, RN, CM, TR, Pharmacy, Neuropsych and SW  Financial Services: Medicare and Private Insurance: [place]  Follow-up services arranged: Other: NHP  Comments (or additional information):PT NEEDS TO BE AT A HIGHER LEVEL BEFORE RETURNING HOME WITH WIFE.  Patient/Family verbalized understanding of follow-up arrangements: Yes  Individual responsible for coordination of the follow-up plan: SELF & RONDA-WIFE  Confirmed correct DME delivered: [name] 09/25/2015    [name]

## 2015-09-25 NOTE — Progress Notes (Signed)
Social Work Patient ID: Jonathan Le, male   DOB: 06/27/1946, 69 y.o.   MRN: [id number] Contacted wife to let her know ambulance was here and he would be on his way shortly. [facility]. Made pt aware of this.

## 2015-09-25 NOTE — Progress Notes (Signed)
Social Work Patient ID: Jonathan Le, male   DOB: 06-10-1946, 69 y.o.   MRN: [id number] Met with pt to inform ambulance coming at 1;00 to transport to [facility]. Paperwork gathered and ready. Pt will miss the staff here, he feels like they have become friends to him. Encouraged him to visit once discharged from [facility] [address]

## 2015-09-25 NOTE — Progress Notes (Signed)
Occupational Therapy Discharge Summary  Patient Details  Name: Jonathan Le MRN: [id number] Date of Birth: 07-11-1946  Today's Date: 09/25/2015 OT Individual Time: 0905-1005 OT Individual Time Calculation (min): 60 min    Session Note:  Pt completed bathing and dressing supine to sit EOB.  Min assist needed for rolling in the bed as well as for thoroughness in sidelying to complete thorough peri washing.  Min assist for sidelying on the right side to sitting as well.  Pt utilized reacher and LH sponge for removing gripper socks and washing/drying LEs.  Educated pt on use of sockaide for donning socks but needed max assist secondary to heel bandages.  Pt donned shorts over feet but did not attempt pulling up at this time secondary to wanting to use the urinal.  Nursing present at end of session to give meds and re-dress his sacral wound.    Patient has met 8 of 8 long term goals due to improved activity tolerance, improved balance, postural control, ability to compensate for deficits and functional use of  RIGHT upper and LEFT upper extremity.  Patient to discharge at [facility] Max Assist level.  Patient's care partner requires assistance to provide the necessary physical and cognitive assistance at discharge.    Reasons goals not met: NA  Recommendation:  Patient will benefit from ongoing skilled OT services in skilled nursing facility setting to continue to advance functional skills in the area of BADL.  Pt continues to need total assist + 2 for all sit to stand and standing balance.  Use of a Steady Lift has been utilized for bed to wheelchair transfers secondary to sacral wound.  Pt continues to make slow progress but he is improving each week.  Unfortunately, feel that him reaching min assist level or greater is still at least 6-8 weeks down the road.  UE strength has improved as well however pt has difficulty with overhead movements and reaching as well as using the UEs to assist with scooting  or functional mobility.  Recommend extensive SNF level rehab over a longer course to continue maximizing functional potential.  Pt is motivated to get better but does not demonstrate a good understanding of the severity of his limitations and that they will take a longer time to improve than just a couple weeks.    Equipment: No equipment provided  Reasons for discharge: treatment goals met and discharge from hospital  Patient/family agrees with progress made and goals achieved: Yes  OT Discharge Precautions/Restrictions  Precautions Precautions: Fall Precaution Comments: sacral decub, knee pain Required Braces or Orthoses: Other Brace/Splint Other Brace/Splint: hinged knee brace on the RLE for comfort Restrictions Weight Bearing Restrictions: No  Pain Pain Assessment Pain Assessment: Faces Faces Pain Scale: Hurts little more Pain Type: Acute pain Pain Location: Buttocks Pain Orientation: Lower Pain Onset: Gradual Pain Intervention(s): Repositioned;Emotional support ADL  See Function Section of chart  Vision/Perception  Vision- History Baseline Vision/History: Wears glasses Wears Glasses: Reading only Patient Visual Report: No change from baseline Vision- Assessment Vision Assessment?: No apparent visual deficits Perception Perception: Within Functional Limits  Cognition Overall Cognitive Status: Within Functional Limits for tasks assessed Arousal/Alertness: Awake/alert Orientation Level: Oriented X4 Attention: Selective Selective Attention: Appears intact Memory: Appears intact Memory Impairment: Decreased recall of new information;Decreased short term memory Decreased Short Term Memory: Functional basic;Functional complex Awareness: Impaired Awareness Impairment: Anticipatory impairment Problem Solving: Impaired Problem Solving Impairment: Functional basic Safety/Judgment: Appears intact Comments: Pt at times with decreased carryover from session to session.  Needs step by step cueing to sequence tasks as he demonstrates some "learned helplessness".  Pt also with unrealistic expectations of progress in relation to time. Sensation Sensation Light Touch: Appears Intact (in BUEs) Light Touch Impaired Details: Impaired LLE Stereognosis: Appears Intact (in BUEs) Hot/Cold: Appears Intact (In BUEs) Proprioception: Appears Intact (In BUEs) Coordination Gross Motor Movements are Fluid and Coordinated: No (slower movements secondary to weakness) Fine Motor Movements are Fluid and Coordinated: Yes Heel Shin Test: NT Motor  Motor Motor: Abnormal postural alignment and control Motor - Skilled Clinical Observations: generalized weakness, poor postural control, abnormal postural alignment Motor - Discharge Observations: Pt coninues to require UE support to compensate for poor trunk control and core strength.  Mobility  Bed Mobility Bed Mobility: Rolling Right;Rolling Left;Supine to Sit;Right Sidelying to Sit Rolling Right: 4: Min assist;With rail Rolling Left: 4: Min assist;With rail Right Sidelying to Sit: 4: Min assist Right Sidelying to Sit Details (indicate cue type and reason): Pt needed min A at trunk to maintain forward roll sufficient to utilize UEs, flat [facility]  Trunk/Postural Assessment  Cervical Assessment Cervical Assessment: Within Functional Limits Thoracic Assessment Thoracic Assessment: Exceptions to [facility] (slight thoracic kyphosis present) Lumbar Assessment Lumbar Assessment: Exceptions to [facility] (pt sits in posterior pelvic tilt with decreased lumbar extension noted with standing) Postural Control Postural Control: Deficits on evaluation Trunk Control: Pt with forward flexed posture in standing secondary to decresed lumbar extension and back pain.    Balance Balance Balance Assessed: Yes Static Sitting Balance Static Sitting - Balance Support: Feet supported;Right upper extremity supported Static Sitting - Level of Assistance: 7:  Independent Dynamic Sitting Balance Dynamic Sitting - Balance Support: Feet supported;During functional activity Dynamic Sitting - Level of Assistance: 6: Modified independent (Device/Increase time) Static Standing Balance Static Standing - Balance Support: Right upper extremity supported;Left upper extremity supported Static Standing - Level of Assistance: 1: +2 Total assist;Patient percentage (comment) (25%) Extremity/Trunk Assessment RUE Assessment RUE Assessment: Exceptions to [facility] RUE Strength RUE Overall Strength Comments: AROM shoulder flexion 0-100 degrees with pt moving into abduction and not able to maintain shoulder flexion.  increased crepitus also noted at well in the shoulder.  Elbow strength 3+/5, grip strength 3+/5 as well. LUE Assessment LUE Assessment: Exceptions to [facility] LUE Strength LUE Overall Strength Comments: AROM shoulder flexion 0-120 degrees, elbow strength and grip strength 3+/5.     See Function Navigator for Current Functional Status.  Menashe Kafer OTR/L 09/25/2015, 12:43 PM

## 2015-09-25 NOTE — Progress Notes (Signed)
Pt discharged to SNF via ambulance. Discharge packet accompanied transporters. Dressing changed to bilateral heels, L arm skin tear, and pressure ulcer to sacrum. Ostomy bag emptied. [facility]

## 2015-09-25 NOTE — Progress Notes (Signed)
ANTICOAGULATION CONSULT NOTE - Follow Up Consult  Pharmacy Consult for coumadin  Indication: DVT  Allergies  Allergen Reactions  . Unasyn [Ampicillin-Sulbactam Sodium] Hives, Itching and Rash    Diffuse rash not responding to Pepcid and Benadryl.  TDD.  Marland Kitchen. Levaquin [Levofloxacin] Nausea Only  . Penicillins Hives  . Doxycycline Rash    Patient Measurements: Height: 5\' 9"  (175.3 cm) Weight: 245 lb 9.6 oz (111.403 kg) IBW/kg (Calculated) : 70.7 Heparin Dosing Weight:   Vital Signs: Temp: 97.9 F (36.6 C) (11/11 0512) Temp Source: Oral (11/11 0512) BP: 100/59 mmHg (11/11 0512) Pulse Rate: 84 (11/11 0512)  Labs:  Recent Labs  09/23/15 0420 09/25/15 0435  LABPROT 26.9* 27.4*  INR 2.52* 2.59*  CREATININE  --  0.41*    Estimated Creatinine Clearance: 105.7 mL/min (by C-G formula based on Cr of 0.41).   Medications:  Scheduled:  . acetaminophen  650 mg Oral BID WC  . antiseptic oral rinse  15 mL Mouth Rinse TID PC  . diclofenac sodium  2 g Topical QID  . famotidine  20 mg Oral BID  . feeding supplement (PRO-STAT SUGAR FREE 64)  30 mL Oral TID  . finasteride  5 mg Oral Daily  . furosemide  40 mg Oral Daily  . hydrocerin   Topical BID  . insulin aspart  0-5 Units Subcutaneous QHS  . insulin aspart  0-9 Units Subcutaneous TID WC  . magnesium oxide  800 mg Oral BID  . multivitamin with minerals  1 tablet Oral Daily  . MUSCLE RUB   Topical TID AC  . oxyCODONE  2.5 mg Oral Q0600  . potassium chloride  30 mEq Oral BID  . sodium chloride  10 mL Intravenous Q12H  . sucralfate  1 g Rectal Daily  . tamsulosin  0.4 mg Oral QPC supper  . vitamin C  500 mg Oral BID  . warfarin  5 mg Oral q1800  . Warfarin - Pharmacist Dosing Inpatient   Does not apply q1800   Infusions:    Assessment: [age] male with DVT is currently on therapeutic coumadin.  INR today is 2.59.  Goal of Therapy:  INR 2-3 Monitor platelets by anticoagulation protocol: Yes   Plan:  - Warfarin 5mg   daily - Monitor daily INR, CBC, s/sx of bleeding - INR MWF  Maurico Perrell, Tsz-Yin 09/25/2015,8:23 AM

## 2015-10-16 ENCOUNTER — Ambulatory Visit
Admission: RE | Admit: 2015-10-16 | Discharge: 2015-10-16 | Disposition: A | Discharge status: Home / Self Care | Payer: Medicare Other | Source: Ambulatory Visit | Attending: Internal Medicine | Admitting: Internal Medicine

## 2015-10-16 ENCOUNTER — Other Ambulatory Visit: Payer: Self-pay | Admitting: Internal Medicine

## 2015-10-16 ENCOUNTER — Other Ambulatory Visit: Payer: Medicare Other

## 2015-10-16 ENCOUNTER — Inpatient Hospital Stay (HOSPITAL_COMMUNITY)
Admission: EM | Admit: 2015-10-16 | Discharge: 2015-10-30 | DRG: 871 | Disposition: A | Discharge status: Skilled Nursing Facility | Payer: Medicare Other | Source: Skilled Nursing Facility | Attending: Internal Medicine | Admitting: Internal Medicine

## 2015-10-16 ENCOUNTER — Encounter (HOSPITAL_COMMUNITY): Payer: Self-pay | Admitting: Emergency Medicine

## 2015-10-16 DIAGNOSIS — Z7901 Long term (current) use of anticoagulants: Secondary | ICD-10-CM

## 2015-10-16 DIAGNOSIS — R1084 Generalized abdominal pain: Secondary | ICD-10-CM | POA: Diagnosis not present

## 2015-10-16 DIAGNOSIS — R6520 Severe sepsis without septic shock: Secondary | ICD-10-CM | POA: Diagnosis not present

## 2015-10-16 DIAGNOSIS — E669 Obesity, unspecified: Secondary | ICD-10-CM | POA: Diagnosis present

## 2015-10-16 DIAGNOSIS — I509 Heart failure, unspecified: Secondary | ICD-10-CM | POA: Insufficient documentation

## 2015-10-16 DIAGNOSIS — D689 Coagulation defect, unspecified: Secondary | ICD-10-CM

## 2015-10-16 DIAGNOSIS — Z87440 Personal history of urinary (tract) infections: Secondary | ICD-10-CM | POA: Diagnosis not present

## 2015-10-16 DIAGNOSIS — E871 Hypo-osmolality and hyponatremia: Secondary | ICD-10-CM | POA: Diagnosis present

## 2015-10-16 DIAGNOSIS — R34 Anuria and oliguria: Secondary | ICD-10-CM | POA: Diagnosis not present

## 2015-10-16 DIAGNOSIS — K651 Peritoneal abscess: Secondary | ICD-10-CM | POA: Diagnosis present

## 2015-10-16 DIAGNOSIS — R791 Abnormal coagulation profile: Secondary | ICD-10-CM | POA: Diagnosis present

## 2015-10-16 DIAGNOSIS — T45515A Adverse effect of anticoagulants, initial encounter: Secondary | ICD-10-CM | POA: Diagnosis present

## 2015-10-16 DIAGNOSIS — R6521 Severe sepsis with septic shock: Secondary | ICD-10-CM | POA: Diagnosis present

## 2015-10-16 DIAGNOSIS — Z6838 Body mass index (BMI) 38.0-38.9, adult: Secondary | ICD-10-CM

## 2015-10-16 DIAGNOSIS — L89159 Pressure ulcer of sacral region, unspecified stage: Secondary | ICD-10-CM | POA: Diagnosis present

## 2015-10-16 DIAGNOSIS — I5042 Chronic combined systolic (congestive) and diastolic (congestive) heart failure: Secondary | ICD-10-CM | POA: Diagnosis present

## 2015-10-16 DIAGNOSIS — T45511A Poisoning by anticoagulants, accidental (unintentional), initial encounter: Secondary | ICD-10-CM

## 2015-10-16 DIAGNOSIS — Y832 Surgical operation with anastomosis, bypass or graft as the cause of abnormal reaction of the patient, or of later complication, without mention of misadventure at the time of the procedure: Secondary | ICD-10-CM | POA: Diagnosis present

## 2015-10-16 DIAGNOSIS — J810 Acute pulmonary edema: Secondary | ICD-10-CM | POA: Diagnosis not present

## 2015-10-16 DIAGNOSIS — D638 Anemia in other chronic diseases classified elsewhere: Secondary | ICD-10-CM | POA: Diagnosis present

## 2015-10-16 DIAGNOSIS — L0291 Cutaneous abscess, unspecified: Secondary | ICD-10-CM

## 2015-10-16 DIAGNOSIS — B9689 Other specified bacterial agents as the cause of diseases classified elsewhere: Secondary | ICD-10-CM | POA: Diagnosis present

## 2015-10-16 DIAGNOSIS — Z932 Ileostomy status: Secondary | ICD-10-CM | POA: Diagnosis not present

## 2015-10-16 DIAGNOSIS — I9589 Other hypotension: Secondary | ICD-10-CM | POA: Diagnosis not present

## 2015-10-16 DIAGNOSIS — Z951 Presence of aortocoronary bypass graft: Secondary | ICD-10-CM | POA: Diagnosis not present

## 2015-10-16 DIAGNOSIS — Z9049 Acquired absence of other specified parts of digestive tract: Secondary | ICD-10-CM | POA: Diagnosis not present

## 2015-10-16 DIAGNOSIS — R509 Fever, unspecified: Secondary | ICD-10-CM

## 2015-10-16 DIAGNOSIS — I959 Hypotension, unspecified: Secondary | ICD-10-CM | POA: Diagnosis present

## 2015-10-16 DIAGNOSIS — I429 Cardiomyopathy, unspecified: Secondary | ICD-10-CM | POA: Diagnosis present

## 2015-10-16 DIAGNOSIS — A419 Sepsis, unspecified organism: Principal | ICD-10-CM | POA: Diagnosis present

## 2015-10-16 DIAGNOSIS — I5022 Chronic systolic (congestive) heart failure: Secondary | ICD-10-CM | POA: Diagnosis present

## 2015-10-16 DIAGNOSIS — H4089 Other specified glaucoma: Secondary | ICD-10-CM | POA: Diagnosis present

## 2015-10-16 DIAGNOSIS — L89154 Pressure ulcer of sacral region, stage 4: Secondary | ICD-10-CM | POA: Diagnosis present

## 2015-10-16 DIAGNOSIS — Z86718 Personal history of other venous thrombosis and embolism: Secondary | ICD-10-CM

## 2015-10-16 DIAGNOSIS — I252 Old myocardial infarction: Secondary | ICD-10-CM

## 2015-10-16 DIAGNOSIS — IMO0002 Reserved for concepts with insufficient information to code with codable children: Secondary | ICD-10-CM | POA: Diagnosis present

## 2015-10-16 DIAGNOSIS — B9629 Other Escherichia coli [E. coli] as the cause of diseases classified elsewhere: Secondary | ICD-10-CM | POA: Diagnosis not present

## 2015-10-16 DIAGNOSIS — H409 Unspecified glaucoma: Secondary | ICD-10-CM | POA: Diagnosis present

## 2015-10-16 DIAGNOSIS — I10 Essential (primary) hypertension: Secondary | ICD-10-CM | POA: Diagnosis not present

## 2015-10-16 DIAGNOSIS — B9620 Unspecified Escherichia coli [E. coli] as the cause of diseases classified elsewhere: Secondary | ICD-10-CM | POA: Diagnosis present

## 2015-10-16 DIAGNOSIS — R0902 Hypoxemia: Secondary | ICD-10-CM | POA: Insufficient documentation

## 2015-10-16 DIAGNOSIS — G4733 Obstructive sleep apnea (adult) (pediatric): Secondary | ICD-10-CM | POA: Diagnosis present

## 2015-10-16 DIAGNOSIS — E43 Unspecified severe protein-calorie malnutrition: Secondary | ICD-10-CM | POA: Diagnosis not present

## 2015-10-16 DIAGNOSIS — I4891 Unspecified atrial fibrillation: Secondary | ICD-10-CM | POA: Diagnosis present

## 2015-10-16 DIAGNOSIS — A4151 Sepsis due to Escherichia coli [E. coli]: Secondary | ICD-10-CM | POA: Diagnosis not present

## 2015-10-16 DIAGNOSIS — E440 Moderate protein-calorie malnutrition: Secondary | ICD-10-CM | POA: Diagnosis present

## 2015-10-16 DIAGNOSIS — I2510 Atherosclerotic heart disease of native coronary artery without angina pectoris: Secondary | ICD-10-CM | POA: Diagnosis present

## 2015-10-16 DIAGNOSIS — E639 Nutritional deficiency, unspecified: Secondary | ICD-10-CM | POA: Diagnosis not present

## 2015-10-16 DIAGNOSIS — Z978 Presence of other specified devices: Secondary | ICD-10-CM | POA: Diagnosis not present

## 2015-10-16 DIAGNOSIS — K9189 Other postprocedural complications and disorders of digestive system: Secondary | ICD-10-CM | POA: Diagnosis present

## 2015-10-16 DIAGNOSIS — D6832 Hemorrhagic disorder due to extrinsic circulating anticoagulants: Secondary | ICD-10-CM | POA: Diagnosis present

## 2015-10-16 DIAGNOSIS — G4700 Insomnia, unspecified: Secondary | ICD-10-CM | POA: Diagnosis not present

## 2015-10-16 HISTORY — DX: Anemia in other chronic diseases classified elsewhere: D63.8

## 2015-10-16 HISTORY — DX: Acute myocardial infarction, unspecified: I21.9

## 2015-10-16 LAB — URINALYSIS, ROUTINE W REFLEX MICROSCOPIC
BILIRUBIN URINE: NEGATIVE
GLUCOSE, UA: NEGATIVE mg/dL
KETONES UR: NEGATIVE mg/dL
Leukocytes, UA: NEGATIVE
Nitrite: NEGATIVE
PROTEIN: NEGATIVE mg/dL
Specific Gravity, Urine: >1.046 — ABNORMAL HIGH (ref 1.005–1.030)
pH: 5.5 (ref 5.0–8.0)

## 2015-10-16 LAB — COMPREHENSIVE METABOLIC PANEL
ALT: 29 U/L (ref 17–63)
AST: 27 U/L (ref 15–41)
Albumin: 1.5 g/dL — ABNORMAL LOW (ref 3.5–5.0)
Alkaline Phosphatase: 150 U/L — ABNORMAL HIGH (ref 38–126)
Anion gap: 9 (ref 5–15)
BILIRUBIN TOTAL: 0.5 mg/dL (ref 0.3–1.2)
BUN: 11 mg/dL (ref 6–20)
CO2: 21 mmol/L — ABNORMAL LOW (ref 22–32)
CREATININE: 0.66 mg/dL (ref 0.61–1.24)
Calcium: 8.6 mg/dL — ABNORMAL LOW (ref 8.9–10.3)
Chloride: 100 mmol/L — ABNORMAL LOW (ref 101–111)
Glucose, Bld: 123 mg/dL — ABNORMAL HIGH (ref 65–99)
POTASSIUM: 4.5 mmol/L (ref 3.5–5.1)
Sodium: 130 mmol/L — ABNORMAL LOW (ref 135–145)
TOTAL PROTEIN: 5.8 g/dL — AB (ref 6.5–8.1)

## 2015-10-16 LAB — LIPASE, BLOOD: LIPASE: 18 U/L (ref 11–51)

## 2015-10-16 LAB — URINE MICROSCOPIC-ADD ON: BACTERIA UA: NONE SEEN

## 2015-10-16 LAB — CBC WITH DIFFERENTIAL/PLATELET
BASOS ABS: 0 10*3/uL (ref 0.0–0.1)
Basophils Relative: 0 %
Eosinophils Absolute: 0 10*3/uL (ref 0.0–0.7)
Eosinophils Relative: 0 %
HCT: 28.8 % — ABNORMAL LOW (ref 39.0–52.0)
Hemoglobin: 9.5 g/dL — ABNORMAL LOW (ref 13.0–17.0)
LYMPHS ABS: 1.4 10*3/uL (ref 0.7–4.0)
Lymphocytes Relative: 8 %
MCH: 27.7 pg (ref 26.0–34.0)
MCHC: 33 g/dL (ref 30.0–36.0)
MCV: 84 fL (ref 78.0–100.0)
MONO ABS: 1.5 10*3/uL — AB (ref 0.1–1.0)
MONOS PCT: 9 %
NEUTROS PCT: 83 %
Neutro Abs: 14 10*3/uL — ABNORMAL HIGH (ref 1.7–7.7)
PLATELETS: 395 10*3/uL (ref 150–400)
RBC: 3.43 MIL/uL — AB (ref 4.22–5.81)
RDW: 15 % (ref 11.5–15.5)
WBC: 16.9 10*3/uL — AB (ref 4.0–10.5)

## 2015-10-16 LAB — I-STAT CG4 LACTIC ACID, ED: LACTIC ACID, VENOUS: 3.18 mmol/L — AB (ref 0.5–2.0)

## 2015-10-16 LAB — TYPE AND SCREEN
ABO/RH(D): O POS
Antibody Screen: NEGATIVE

## 2015-10-16 LAB — PROTIME-INR
INR: 2.45 — ABNORMAL HIGH (ref 0.00–1.49)
Prothrombin Time: 26.3 seconds — ABNORMAL HIGH (ref 11.6–15.2)

## 2015-10-16 MED ORDER — SODIUM CHLORIDE 0.9 % IV BOLUS (SEPSIS)
1000.0000 mL | INTRAVENOUS | Status: AC
  Administered 2015-10-16 (×3): 1000 mL via INTRAVENOUS

## 2015-10-16 MED ORDER — DEXTROSE 5 % IV SOLN
1.0000 g | Freq: Once | INTRAVENOUS | Status: AC
  Administered 2015-10-16: 1 g via INTRAVENOUS
  Filled 2015-10-16: qty 10

## 2015-10-16 MED ORDER — VANCOMYCIN HCL IN DEXTROSE 1-5 GM/200ML-% IV SOLN
1000.0000 mg | Freq: Two times a day (BID) | INTRAVENOUS | Status: DC
  Administered 2015-10-17 – 2015-10-20 (×7): 1000 mg via INTRAVENOUS
  Filled 2015-10-16 (×7): qty 200

## 2015-10-16 MED ORDER — VANCOMYCIN HCL 10 G IV SOLR
2000.0000 mg | Freq: Once | INTRAVENOUS | Status: AC
  Administered 2015-10-16: 2000 mg via INTRAVENOUS
  Filled 2015-10-16: qty 2000

## 2015-10-16 MED ORDER — IOPAMIDOL (ISOVUE-300) INJECTION 61%
100.0000 mL | Freq: Once | INTRAVENOUS | Status: AC | PRN
  Administered 2015-10-16: 100 mL via INTRAVENOUS

## 2015-10-16 MED ORDER — METRONIDAZOLE IN NACL 5-0.79 MG/ML-% IV SOLN
500.0000 mg | Freq: Once | INTRAVENOUS | Status: AC
  Administered 2015-10-16: 500 mg via INTRAVENOUS
  Filled 2015-10-16: qty 100

## 2015-10-16 MED ORDER — METRONIDAZOLE IN NACL 5-0.79 MG/ML-% IV SOLN
500.0000 mg | Freq: Three times a day (TID) | INTRAVENOUS | Status: DC
  Administered 2015-10-17 – 2015-10-20 (×11): 500 mg via INTRAVENOUS
  Filled 2015-10-16 (×11): qty 100

## 2015-10-16 MED ORDER — SODIUM CHLORIDE 0.9 % IV SOLN: Freq: Once | INTRAVENOUS | Status: AC

## 2015-10-16 MED ORDER — SODIUM CHLORIDE 0.9 % IV BOLUS (SEPSIS)
500.0000 mL | INTRAVENOUS | Status: AC
  Administered 2015-10-16: 500 mL via INTRAVENOUS

## 2015-10-16 MED ORDER — DEXTROSE 5 % IV SOLN: 1.0000 g | INTRAVENOUS | Status: DC

## 2015-10-16 NOTE — H&P (Signed)
[facility] Admission History and Physical       Jonathan Le ZOX:[id number] DOB: June 12, 1946 DOA: 10/16/2015  Referring physician: EDP PCP: Elizabeth Palau, FNP  Specialists:   Chief Complaint: Fever Chills  HPI: Jonathan Le is a 69 y.o. male with a history of CAD S/P CABG, CHF, HTN, Hx DVT on Coumadin, OSA, and Glaucoma with a complex recent surgical history who was sent from the [facility] due to fevers and chills for the last 2 weeks despite antibiotics for a UTI, and had low blood pressures for the past 2 days.  He was sent for an outpatient CT scan which revealed a large abscess 16 x 10 x 21 cm and he was sent to the ED for admission and treatment.  He was seen by General Surgery Dr Ma Rings, and placed on IV Vancomycin , Rocephin and Metronidazole , and administered FFP.  He was referred for admission to the [facility].      Review of Systems:  Constitutional: No Weight Loss, No Weight Gain, Night Sweats, +Fevers, +Chills, Dizziness, Light Headedness, Fatigue, or Generalized Weakness HEENT: No Headaches, Difficulty Swallowing,Tooth/Dental Problems,Sore Throat,  No Sneezing, Rhinitis, Ear Ache, Nasal Congestion, or Post Nasal Drip,  Cardio-vascular:  No Chest pain, Orthopnea, PND, Edema in Lower Extremities, Anasarca, Dizziness, Palpitations  Resp: No Dyspnea, No DOE, No Productive Cough, No Non-Productive Cough, No Hemoptysis, No Wheezing.    GI: No Heartburn, Indigestion, +Abdominal Pain, Nausea, Vomiting, Diarrhea, Constipation, Hematemesis, Hematochezia, Melena, Change in Bowel Habits,  +Loss of Appetite  GU: No Dysuria, No Change in Color of Urine, No Urgency or Urinary Frequency, No Flank pain.  Musculoskeletal: No Joint Pain or Swelling, No Decreased Range of Motion, No Back Pain.  Neurologic: No Syncope, No Seizures, Muscle Weakness, Paresthesia, Vision Disturbance or Loss, No Diplopia, No Vertigo, No Difficulty Walking,  Skin: No Rash or  Lesions. Psych: No Change in Mood or Affect, No Depression or Anxiety, No Memory loss, No Confusion, or Hallucinations   Past Medical History  Diagnosis Date  . Anxiety   . Hypertension   . Arthritis   . Sleep apnea     wears CPAP nightly  . Glaucoma   . CHF (congestive heart failure) (HCC)     MI, with open heart surgery x 30 sdays ago per pt. (06/26/15)  . S/P CABG (coronary artery bypass graft)   . MI (myocardial infarction) [facility])      Past Surgical History  Procedure Laterality Date  . Hernia repair      right  . Eye surgery      bil cataract  . Wisdom tooth extraction    . Fasciotomy foot / toe Left     foot  . Cardiac catheterization N/A 05/20/2015    Procedure: Left Heart Cath and Coronary Angiography;  Surgeon: Kathleene Hazel, MD;  Location: [facility] INVASIVE CV LAB;  Service: Cardiovascular;  Laterality: N/A;  . Coronary artery bypass graft N/A 05/22/2015    Procedure: CORONARY ARTERY BYPASS GRAFTING (CABG), ON PUMP, TIMES THREE, USING LEFT INTERNAL MAMMARY ARTERY, RIGHT GREATER SAPHENOUS VEIN HARVESTED ENDOSCOPICALLY;  Surgeon: Kerin Perna, MD;  Location: [facility] OR;  Service: Open Heart Surgery;  Laterality: N/A;  LIMA-LAD, SVG-OM, SVG-PD  . Tee without cardioversion N/A 05/22/2015    Procedure: TRANSESOPHAGEAL ECHOCARDIOGRAM (TEE);  Surgeon: Kerin Perna, MD;  Location: [facility] OR;  Service: Open Heart Surgery;  Laterality: N/A;  . Laparotomy N/A 06/18/2015    Procedure: EXPLORATORY LAPAROTOMY;  Surgeon:  Emelia LoronMatthew Wakefield, MD;  [facility];  Service: General;  Laterality: N/A;  . Laparotomy N/A 06/20/2015    Procedure: WASHOUT OF ABDOMEN, CLOSURE OF ABDOMINAL WALL WITH PLACEMENT OF WOUND VAC AND ILLEOSTOMY ;  Surgeon: Almond LintFaera Byerly, MD;  Location: [facility] OR;  Service: General;  Laterality: N/A;  . Tracheostomy tube placement N/A 07/03/2015    Procedure: TRACHEOSTOMY;  Surgeon: Serena ColonelJefry Rosen, MD;  [facility];  Service: ENT;  Laterality: N/A;      Prior to Admission  medications   Medication Sig Start Date End Date Taking? Authorizing Provider  acetaminophen (TYLENOL) 325 MG tablet Take 2 tablets (650 mg total) by mouth 2 (two) times daily with breakfast and lunch. 09/24/15  Yes Evlyn KannerPamela S Love, PA-C  Amino Acids-Protein Hydrolys (FEEDING SUPPLEMENT, PRO-STAT SUGAR FREE 64,) LIQD Take 30 mLs by mouth 3 (three) times daily. 09/24/15  Yes Evlyn KannerPamela S Love, PA-C  antiseptic oral rinse (BIOTENE) LIQD 15 mLs by Mouth Rinse route 3 (three) times daily after meals. 09/24/15  Yes Evlyn KannerPamela S Love, PA-C  diclofenac sodium (VOLTAREN) 1 % GEL Apply 2 g topically 4 (four) times daily. 09/24/15  Yes Evlyn KannerPamela S Love, PA-C  famotidine (PEPCID) 20 MG tablet Take 1 tablet (20 mg total) by mouth 2 (two) times daily. 09/24/15  Yes Evlyn KannerPamela S Love, PA-C  finasteride (PROSCAR) 5 MG tablet Take 1 tablet (5 mg total) by mouth daily. 09/24/15  Yes Evlyn KannerPamela S Love, PA-C  furosemide (LASIX) 40 MG tablet Take 1 tablet (40 mg total) by mouth daily. 09/24/15  Yes Evlyn KannerPamela S Love, PA-C  HYDROcodone-acetaminophen (NORCO/VICODIN) 5-325 MG tablet Take 1 tablet by mouth every 6 (six) hours as needed for moderate pain.   Yes Historical Provider, MD  magnesium oxide (MAG-OX) 400 (241.3 MG) MG tablet Take 2 tablets (800 mg total) by mouth 2 (two) times daily. 09/24/15  Yes Evlyn KannerPamela S Love, PA-C  Menthol-Methyl Salicylate (MUSCLE RUB) 10-15 % CREA Apply 1 application topically 3 (three) times daily before meals. 09/24/15  Yes Evlyn KannerPamela S Love, PA-C  Multiple Vitamins-Minerals (DECUBI-VITE PO) Take 1 tablet by mouth daily.   Yes Historical Provider, MD  ondansetron (ZOFRAN) 4 MG tablet Take 1 tablet (4 mg total) by mouth every 6 (six) hours as needed for nausea. 09/24/15  Yes Evlyn KannerPamela S Love, PA-C  pantoprazole (PROTONIX) 40 MG tablet Take 40 mg by mouth daily.   Yes Historical Provider, MD  potassium chloride (K-DUR,KLOR-CON) 10 MEQ tablet Take 30 mEq by mouth 2 (two) times daily.   Yes Historical Provider, MD   PRESCRIPTION MEDICATION Take 120 mLs by mouth 2 (two) times daily. Medpass   Yes Historical Provider, MD  tamsulosin (FLOMAX) 0.4 MG CAPS capsule Take 1 capsule (0.4 mg total) by mouth daily after supper. 09/24/15  Yes Evlyn KannerPamela S Love, PA-C  warfarin (COUMADIN) 5 MG tablet Take 1 tablet (5 mg total) by mouth daily at 6 PM. 09/24/15  Yes Evlyn KannerPamela S Love, PA-C     Allergies  Allergen Reactions  . Unasyn [Ampicillin-Sulbactam Sodium] Hives, Itching and Rash    Diffuse rash not responding to Pepcid and Benadryl.  TDD.  Marland Kitchen. Levaquin [Levofloxacin] Nausea Only  . Penicillins Hives  . Doxycycline Rash    Social History:  reports that he has never smoked. He has never used smokeless tobacco. He reports that he does not drink alcohol or use illicit drugs.    History reviewed. No pertinent family history.     Physical Exam:  GEN:  Pleasant  Obese Elderly 69 y.o. Caucasian male examined and in no acute distress; cooperative with exam Filed Vitals:   10/16/15 2000 10/16/15 2015 10/16/15 2030 10/16/15 2054  BP: 119/82 97/56 100/55 100/55  Pulse: 103 92 91 92  Temp:    98 F (36.7 C)  TempSrc:    Oral  Resp: 24   24  Height:      Weight:      SpO2: 95% 94% 95% 95%   Blood pressure 100/55, pulse 92, temperature 98 F (36.7 C), temperature source Oral, resp. rate 24, height 5\' 9"  (1.753 m), weight 105.235 kg (232 lb), SpO2 95 %. PSYCH: He is alert and oriented x4; does not appear anxious does not appear depressed; affect is normal HEENT: Normocephalic and Atraumatic, Mucous membranes pink; PERRLA; EOM intact; Fundi:  Benign;  No scleral icterus, Nares: Patent, Oropharynx: Clear, Fair Dentition,    Neck:  FROM, No Cervical Lymphadenopathy nor Thyromegaly or Carotid Bruit; No JVD; Breasts:: Not examined CHEST WALL: No tenderness CHEST: Normal respiration, clear to auscultation bilaterally HEART: Regular rate and rhythm; no murmurs rubs or gallops BACK: No kyphosis or scoliosis; No CVA  tenderness ABDOMEN: Positive Bowel Sounds,+ Ileostomy Present Obese, Soft Non-Tender, No Rebound or Guarding; No Masses, No Organomegaly. Rectal Exam: Not done EXTREMITIES: No Cyanosis, Clubbing, or Edema; No Ulcerations. Genitalia: not examined PULSES: 2+ and symmetric SKIN: Normal hydration no rash or ulceration CNS:  Alert and Oriented x 4, No Focal Deficits, Generalized Weakness Vascular: pulses palpable throughout    Labs on Admission:  Basic Metabolic Panel:  Recent Labs Lab 10/16/15 1718  NA 130*  K 4.5  CL 100*  CO2 21*  GLUCOSE 123*  BUN 11  CREATININE 0.66  CALCIUM 8.6*   Liver Function Tests:  Recent Labs Lab 10/16/15 1718  AST 27  ALT 29  ALKPHOS 150*  BILITOT 0.5  PROT 5.8*  ALBUMIN 1.5*    Recent Labs Lab 10/16/15 1718  LIPASE 18   No results for input(s): AMMONIA in the last 168 hours. CBC:  Recent Labs Lab 10/16/15 1718  WBC 16.9*  NEUTROABS 14.0*  HGB 9.5*  HCT 28.8*  MCV 84.0  PLT 395   Cardiac Enzymes: No results for input(s): CKTOTAL, CKMB, CKMBINDEX, TROPONINI in the last 168 hours.  BNP (last 3 results)  Recent Labs  07/28/15 0500 08/02/15 0620 08/16/15 0701  BNP 453.8* 367.7* 528.3*    ProBNP (last 3 results) No results for input(s): PROBNP in the last 8760 hours.  CBG: No results for input(s): GLUCAP in the last 168 hours.  Radiological Exams on Admission: Ct Abdomen Pelvis W Contrast  10/16/2015  CLINICAL DATA:  Fever and abdominal pain.  Partial colectomy. EXAM: CT ABDOMEN AND PELVIS WITH CONTRAST TECHNIQUE: Multidetector CT imaging of the abdomen and pelvis was performed using the standard protocol following bolus administration of intravenous contrast. CONTRAST:  ISOVUE-300 IOPAMIDOL (ISOVUE-300) INJECTION 61% COMPARISON:  09/18/2015 FINDINGS: Lower chest and abdominal wall: Continued decrease in left abdominal wall fluid collections, with no residual drainable collection. Percutaneous drains have been  removed from the site. 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 A chronic and thick walled left pleural effusion continues to decrease in volume. Underlying left lower lobe scarring is again demonstrated and stable. Extensive atherosclerosis, including the coronary arteries. Hepatobiliary: No focal liver abnormality.No evidence of biliary obstruction or stone. Pancreas: Unremarkable. Spleen: Splenorenal shunt as previously seen. No acute portal venous obstruction. Adrenals/Urinary  Tract: Negative adrenals. Bilateral small nonobstructing stones, up to 5 mm. No hydronephrosis or ureteral calculus. Chronic mild bladder wall thickening. No surrounding inflammatory change. Reproductive:Marked enlargement of the prostate, symmetric and stable. Stomach/Bowel: Status post colectomy with stable fluid filling of the Hartman's pouch. The wall of the colonic remnant remains thick, but there is no evidence of surrounding inflammation. There is a large gas and fluid collection with a well-defined rim enhancement in the central abdomen measuring 16 x 10 x 21 cm, consistent with abscess. This is likely in the interloop peritoneum. There are small daughter cystic areas which appear contiguous. There is also tracking into the anterior abdominal wall with potential loculated areas. Oral contrast reaches a right lower quadrant ileostomy and does not opacify the collection to suggest current bowel leak. No fluid collection seen in this area previously. Vascular/Lymphatic: No acute vascular abnormality. Splenorenal shunt without current acute obstruction. Musculoskeletal: Advanced degenerative disc and facet disease disk with marked sclerosis and disc narrowing at L2-3 and L3-4. Chronic L4-5 anterolisthesis. These results were called by telephone at the time of interpretation on 10/16/2015 at 3:17 pm to [facility] Erskine Squibb, who verbally acknowledged these results. IMPRESSION:  1. 16 x 10 x 21 cm central abdominal abscess tracking into the ventral abdominal wall. Oral contrast reaches the patient's ileostomy and does not opacify the abscess to suggest bowel leak/fistula. 2. Left abdominal wall collections seen on comparison study 09/18/2015 have resolved. 3. Chronic complex left pleural effusion is small and decreased. 4. Stable appearance of sacral decubitus ulcer extending to bone. 5. Splenorenal varix. Electronically Signed   By: Marnee Spring M.D.   On: 10/16/2015 15:30     EKG: Independently reviewed. Normal Sinus Rhythm Rate = 93 +LBBB        Assessment/Plan:   Principal Problem:   1.     Abdominal abscess (HCC)/ Sepsis (HCC)Active Problems:   Sepsis Workup   IV Vancomycin , Rocephin, and Metronidazole   IVFs   General Surgery Saw in ED ( Dr. Ma Rings)     2.    Hypotension/Septic Shock   IVFs   Hold Anti-Hypertensives   Monitor BPs     3.    Warfarin-induced coagulopathy (HCC)   FFP x1 given to Reverse   Hold Coumadin Rx   Monitor PT/INR daily       4.     Abdominal pain, generalized- due to #1   Pain Control PRN     5.    Sacral decubitus ulcer- Stage IV   Wound Care eval and Rx     6.    CAD, multiple vessel- S/P CABG     7.    Chronic systolic CHF (congestive heart failure) (HCC)   Monitor I/Os     8.    Anemia of chronic disease   Monitor Trend     9.    Hyponatremia   IVFs   Monitor Trend   10.    OSA on CPAP   CPAP qhs   11.    DVT Prophylaxis   On Coumadin Being Reversed   SCDs    Code Status:     FULL CODE        Family Communication:   Wife at Bedside     Disposition Plan:    Inpatient Status        Time spent:  54 Minutes      Ron Parker [facility] Pager [phone]   If 7AM -7PM Please Contact the Day  Rounding Team MD for [facility]  If 7PM-7AM, Please Contact Night-Floor Coverage  [email or web address] Password [email or web address] 10/16/2015, 9:08 PM     ADDENDUM:   Patient was seen and examined  on 10/16/2015

## 2015-10-16 NOTE — ED Notes (Signed)
Dr. Schlossman at bedside. 

## 2015-10-16 NOTE — ED Notes (Signed)
Pt went to MD office for CT of abd since he has had low grade fever/ loss of appetite/tightness in abd. Pt has hx of abd abscess. MD found abd abscess on CT scan, pt brought here for surgery. Pt from [facility]. BP 98/56, HR 102, resp 18, 96% on room air, temp 99.4 oral

## 2015-10-16 NOTE — Progress Notes (Signed)
[facility]. RT will placed patient on CPAP over there.

## 2015-10-16 NOTE — ED Provider Notes (Signed)
CSN: [id number]     Arrival date & time 10/16/15  1656 History   First MD Initiated Contact with Patient 10/16/15 1706     Chief Complaint  Patient presents with  . Abscess     (Consider location/radiation/quality/duration/timing/severity/associated sxs/prior Treatment) HPI Comments: 2.5 weeks ago had abdominal pain, which improved, treated as kidney infection Abx for kidney infection hasnt helped 101.4 fever 2 days ago Off and on fever, been receiving tylenol which helps Pressures lower 100s/50s CT done today showing recurrent intraabdominal abscess    Patient is a 69 y.o. male presenting with abscess.  Abscess Associated symptoms: fatigue   Associated symptoms: no fever, no headaches, no nausea and no vomiting     Past Medical History  Diagnosis Date  . Anxiety   . Hypertension   . Arthritis   . Sleep apnea     wears CPAP nightly  . Glaucoma   . CHF (congestive heart failure) (HCC)     MI, with open heart surgery x 30 sdays ago per pt. (06/26/15)  . S/P CABG (coronary artery bypass graft)   . MI (myocardial infarction) [facility])    Past Surgical History  Procedure Laterality Date  . Hernia repair      right  . Eye surgery      bil cataract  . Wisdom tooth extraction    . Fasciotomy foot / toe Left     foot  . Cardiac catheterization N/A 05/20/2015    Procedure: Left Heart Cath and Coronary Angiography;  Surgeon: Kathleene Hazel, MD;  Location: [facility] INVASIVE CV LAB;  Service: Cardiovascular;  Laterality: N/A;  . Coronary artery bypass graft N/A 05/22/2015    Procedure: CORONARY ARTERY BYPASS GRAFTING (CABG), ON PUMP, TIMES THREE, USING LEFT INTERNAL MAMMARY ARTERY, RIGHT GREATER SAPHENOUS VEIN HARVESTED ENDOSCOPICALLY;  Surgeon: Kerin Perna, MD;  Location[facility] OR;  Service: Open Heart Surgery;  Laterality: N/A;  LIMA-LAD, SVG-OM, SVG-PD  . Tee without cardioversion N/A 05/22/2015    Procedure: TRANSESOPHAGEAL ECHOCARDIOGRAM (TEE);  Surgeon: Kerin Perna, MD;   Location: [facility] OR;  Service: Open Heart Surgery;  Laterality: N/A;  . Laparotomy N/A 06/18/2015    Procedure: EXPLORATORY LAPAROTOMY;  Surgeon: Emelia Loron, MD;  Location: [facility] OR;  Service: General;  Laterality: N/A;  . Laparotomy N/A 06/20/2015    Procedure: WASHOUT OF ABDOMEN, CLOSURE OF ABDOMINAL WALL WITH PLACEMENT OF WOUND VAC AND ILLEOSTOMY ;  Surgeon: Almond Lint, MD;  Location: [facility] OR;  Service: General;  Laterality: N/A;  . Tracheostomy tube placement N/A 07/03/2015    Procedure: TRACHEOSTOMY;  Surgeon: Serena Colonel, MD;  Location: [facility] OR;  Service: ENT;  Laterality: N/A;   Family History  Problem Relation Age of Onset  . Alzheimer's disease Father   . Rheum arthritis Mother    Social History  Substance Use Topics  . Smoking status: Never Smoker   . Smokeless tobacco: Never Used  . Alcohol Use: No    Review of Systems  Constitutional: Positive for fatigue. Negative for fever.  HENT: Negative for sore throat.   Eyes: Negative for visual disturbance.  Respiratory: Negative for shortness of breath.   Cardiovascular: Negative for chest pain.  Gastrointestinal: Negative for nausea, vomiting and abdominal pain.  Genitourinary: Negative for difficulty urinating.  Musculoskeletal: Negative for back pain and neck stiffness.  Skin: Negative for rash.  Neurological: Negative for syncope and headaches.      Allergies  Unasyn; Levaquin; Penicillins; and Doxycycline  Home Medications   Prior  to Admission medications   Medication Sig Start Date End Date Taking? Authorizing Provider  acetaminophen (TYLENOL) 325 MG tablet Take 2 tablets (650 mg total) by mouth 2 (two) times daily with breakfast and lunch. 09/24/15  Yes Evlyn Kanner Love, PA-C  Amino Acids-Protein Hydrolys (FEEDING SUPPLEMENT, PRO-STAT SUGAR FREE 64,) LIQD Take 30 mLs by mouth 3 (three) times daily. 09/24/15  Yes Evlyn Kanner Love, PA-C  antiseptic oral rinse (BIOTENE) LIQD 15 mLs by Mouth Rinse route 3 (three) times daily  after meals. 09/24/15  Yes Evlyn Kanner Love, PA-C  diclofenac sodium (VOLTAREN) 1 % GEL Apply 2 g topically 4 (four) times daily. 09/24/15  Yes Evlyn Kanner Love, PA-C  famotidine (PEPCID) 20 MG tablet Take 1 tablet (20 mg total) by mouth 2 (two) times daily. 09/24/15  Yes Evlyn Kanner Love, PA-C  finasteride (PROSCAR) 5 MG tablet Take 1 tablet (5 mg total) by mouth daily. 09/24/15  Yes Evlyn Kanner Love, PA-C  furosemide (LASIX) 40 MG tablet Take 1 tablet (40 mg total) by mouth daily. 09/24/15  Yes Evlyn Kanner Love, PA-C  HYDROcodone-acetaminophen (NORCO/VICODIN) 5-325 MG tablet Take 1 tablet by mouth every 6 (six) hours as needed for moderate pain.   Yes Historical Provider, MD  magnesium oxide (MAG-OX) 400 (241.3 MG) MG tablet Take 2 tablets (800 mg total) by mouth 2 (two) times daily. 09/24/15  Yes Evlyn Kanner Love, PA-C  Menthol-Methyl Salicylate (MUSCLE RUB) 10-15 % CREA Apply 1 application topically 3 (three) times daily before meals. 09/24/15  Yes Evlyn Kanner Love, PA-C  Multiple Vitamins-Minerals (DECUBI-VITE PO) Take 1 tablet by mouth daily.   Yes Historical Provider, MD  ondansetron (ZOFRAN) 4 MG tablet Take 1 tablet (4 mg total) by mouth every 6 (six) hours as needed for nausea. 09/24/15  Yes Evlyn Kanner Love, PA-C  pantoprazole (PROTONIX) 40 MG tablet Take 40 mg by mouth daily.   Yes Historical Provider, MD  potassium chloride (K-DUR,KLOR-CON) 10 MEQ tablet Take 30 mEq by mouth 2 (two) times daily.   Yes Historical Provider, MD  PRESCRIPTION MEDICATION Take 120 mLs by mouth 2 (two) times daily. Medpass   Yes Historical Provider, MD  tamsulosin (FLOMAX) 0.4 MG CAPS capsule Take 1 capsule (0.4 mg total) by mouth daily after supper. 09/24/15  Yes Evlyn Kanner Love, PA-C  warfarin (COUMADIN) 5 MG tablet Take 1 tablet (5 mg total) by mouth daily at 6 PM. 09/24/15  Yes Evlyn Kanner Love, PA-C   BP 87/49 mmHg  Pulse 93  Temp(Src) 98.1 F (36.7 C) (Oral)  Resp 32  Ht 5\' 9"  (1.753 m)  Wt 232 lb (105.235 kg)  BMI 34.24 kg/m2   SpO2 93% Physical Exam  Constitutional: He is oriented to person, place, and time. He appears well-developed and well-nourished. He appears ill. No distress.  HENT:  Head: Normocephalic and atraumatic.  Eyes: Conjunctivae and EOM are normal.  Neck: Normal range of motion.  Cardiovascular: Normal rate, regular rhythm, normal heart sounds and intact distal pulses.  Exam reveals no gallop and no friction rub.   No murmur heard. Pulmonary/Chest: Effort normal and breath sounds normal. No respiratory distress. He has no wheezes. He has no rales.  Abdominal: Soft. He exhibits no distension. There is no tenderness. There is no guarding.  Ventral abd incision, no surrounding erythema, ileostomy with yellow drainage  Musculoskeletal: He exhibits no edema.  Neurological: He is alert and oriented to person, place, and time.  Skin: Skin is warm and dry. He is not  diaphoretic.  Nursing note and vitals reviewed.   ED Course  Procedures (including critical care time) Labs Review Labs Reviewed  COMPREHENSIVE METABOLIC PANEL - Abnormal; Notable for the following:    Sodium 130 (*)    Chloride 100 (*)    CO2 21 (*)    Glucose, Bld 123 (*)    Calcium 8.6 (*)    Total Protein 5.8 (*)    Albumin 1.5 (*)    Alkaline Phosphatase 150 (*)    All other components within normal limits  CBC WITH DIFFERENTIAL/PLATELET - Abnormal; Notable for the following:    WBC 16.9 (*)    RBC 3.43 (*)    Hemoglobin 9.5 (*)    HCT 28.8 (*)    Neutro Abs 14.0 (*)    Monocytes Absolute 1.5 (*)    All other components within normal limits  URINALYSIS, ROUTINE W REFLEX MICROSCOPIC (NOT AT [facility]) - Abnormal; Notable for the following:    Specific Gravity, Urine >1.046 (*)    Hgb urine dipstick SMALL (*)    All other components within normal limits  PROTIME-INR - Abnormal; Notable for the following:    Prothrombin Time 26.3 (*)    INR 2.45 (*)    All other components within normal limits  URINE MICROSCOPIC-ADD ON -  Abnormal; Notable for the following:    Squamous Epithelial / LPF 0-5 (*)    All other components within normal limits  LACTIC ACID, PLASMA - Abnormal; Notable for the following:    Lactic Acid, Venous 2.1 (*)    All other components within normal limits  BASIC METABOLIC PANEL - Abnormal; Notable for the following:    CO2 20 (*)    Glucose, Bld 108 (*)    Creatinine, Ser 0.40 (*)    Calcium 8.4 (*)    All other components within normal limits  CBC - Abnormal; Notable for the following:    WBC 11.6 (*)    RBC 3.17 (*)    Hemoglobin 8.7 (*)    HCT 27.1 (*)    All other components within normal limits  PROTIME-INR - Abnormal; Notable for the following:    Prothrombin Time 24.2 (*)    INR 2.19 (*)    All other components within normal limits  I-STAT CG4 LACTIC ACID, ED - Abnormal; Notable for the following:    Lactic Acid, Venous 3.18 (*)    All other components within normal limits  URINE CULTURE  CULTURE, BLOOD (ROUTINE X 2)  CULTURE, BLOOD (ROUTINE X 2)  MRSA PCR SCREENING  LIPASE, BLOOD  LACTIC ACID, PLASMA  LACTIC ACID, PLASMA  I-STAT CG4 LACTIC ACID, ED  TYPE AND SCREEN  PREPARE FRESH FROZEN PLASMA  TYPE AND SCREEN  PREPARE FRESH FROZEN PLASMA  ABO/RH    Imaging Review Ct Abdomen Pelvis W Contrast  10/16/2015  CLINICAL DATA:  Fever and abdominal pain.  Partial colectomy. EXAM: CT ABDOMEN AND PELVIS WITH CONTRAST TECHNIQUE: Multidetector CT imaging of the abdomen and pelvis was performed using the standard protocol following bolus administration of intravenous contrast. CONTRAST:  100mL ISOVUE-300 IOPAMIDOL (ISOVUE-300) INJECTION 61% COMPARISON:  09/18/2015 FINDINGS: Lower chest and abdominal wall: Continued decrease in left abdominal wall fluid collections, with no residual drainable collection. Percutaneous drains have been removed from the site. 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 A chronic and  thick walled left pleural effusion continues to decrease in volume. Underlying left lower lobe scarring is again  demonstrated and stable. Extensive atherosclerosis, including the coronary arteries. Hepatobiliary: No focal liver abnormality.No evidence of biliary obstruction or stone. Pancreas: Unremarkable. Spleen: Splenorenal shunt as previously seen. No acute portal venous obstruction. Adrenals/Urinary Tract: Negative adrenals. Bilateral small nonobstructing stones, up to 5 mm. No hydronephrosis or ureteral calculus. Chronic mild bladder wall thickening. No surrounding inflammatory change. Reproductive:Marked enlargement of the prostate, symmetric and stable. Stomach/Bowel: Status post colectomy with stable fluid filling of the Hartman's pouch. The wall of the colonic remnant remains thick, but there is no evidence of surrounding inflammation. There is a large gas and fluid collection with a well-defined rim enhancement in the central abdomen measuring 16 x 10 x 21 cm, consistent with abscess. This is likely in the interloop peritoneum. There are small daughter cystic areas which appear contiguous. There is also tracking into the anterior abdominal wall with potential loculated areas. Oral contrast reaches a right lower quadrant ileostomy and does not opacify the collection to suggest current bowel leak. No fluid collection seen in this area previously. Vascular/Lymphatic: No acute vascular abnormality. Splenorenal shunt without current acute obstruction. Musculoskeletal: Advanced degenerative disc and facet disease disk with marked sclerosis and disc narrowing at L2-3 and L3-4. Chronic L4-5 anterolisthesis. These results were called by telephone at the time of interpretation on 10/16/2015 at 3:17 pm to [facility] Erskine Squibb, who verbally acknowledged these results. IMPRESSION: 1. 16 x 10 x 21 cm central abdominal abscess tracking into the ventral abdominal wall. Oral contrast reaches the patient's ileostomy and does not  opacify the abscess to suggest bowel leak/fistula. 2. Left abdominal wall collections seen on comparison study 09/18/2015 have resolved. 3. Chronic complex left pleural effusion is small and decreased. 4. Stable appearance of sacral decubitus ulcer extending to bone. 5. Splenorenal varix. Electronically Signed   By: Marnee Spring M.D.   On: 10/16/2015 15:30   I have personally reviewed and evaluated these images and lab results as part of my medical decision-making.   EKG Interpretation   Date/Time:  Friday October 16 2015 17:15:34 EST Ventricular Rate:  93 PR Interval:  136 QRS Duration: 135 QT Interval:  348 QTC Calculation: 433 R Axis:   -63 Text Interpretation:  Sinus rhythm Probable left atrial enlargement Left  bundle branch block ED PHYSICIAN INTERPRETATION AVAILABLE IN [facility] Confirmed by TEST, Record ([id number]) on 10/17/2015 10:02:13 AM       CRITICAL CARE: Sepsis, hypotension Performed by: Rhae Lerner   Total critical care time: 30 minutes  Critical care time was exclusive of separately billable procedures and treating other patients.  Critical care was necessary to treat or prevent imminent or life-threatening deterioration.  Critical care was time spent personally by me on the following activities: development of treatment plan with patient and/or surrogate as well as nursing, discussions with consultants, evaluation of patient's response to treatment, examination of patient, obtaining history from patient or surrogate, ordering and performing treatments and interventions, ordering and review of laboratory studies, ordering and review of radiographic studies, pulse oximetry and re-evaluation of patient's condition.   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 Dwain Sarna presents with concern for fever, low blood pressures and intraabdominal abscess diagnosed by outpt CT.  Code sepsis initiated on arrival. Patient hypotensive, febrile.  Given 3.5L NS,  vancomycin/rocephin/metronidazole given penicillin allergy.  Consulted surgery for concern for abscess as cause of sepsis and called IR for perc drain. Pt on coumadin, receiving FFP for reversal. BP improved with IVF, mental status normal, will admit to step down unit with hospitalist.    Alvira Monday, MD 10/17/15 1358

## 2015-10-16 NOTE — Progress Notes (Signed)
ANTIBIOTIC CONSULT NOTE - INITIAL  Pharmacy Consult for Vancomycin Indication: Sepsis   Allergies  Allergen Reactions  . Unasyn [Ampicillin-Sulbactam Sodium] Hives, Itching and Rash    Diffuse rash not responding to Pepcid and Benadryl.  TDD.  Marland Kitchen. Levaquin [Levofloxacin] Nausea Only  . Penicillins Hives  . Doxycycline Rash    Patient Measurements: Height: 5\' 9"  (175.3 cm) Weight: 232 lb (105.235 kg) IBW/kg (Calculated) : 70.7  Vital Signs:   Intake/Output from previous day:   Intake/Output from this shift:    Labs: No results for input(s): WBC, HGB, PLT, LABCREA, CREATININE in the last 72 hours. CrCl cannot be calculated (Patient has no serum creatinine result on file.). No results for input(s): VANCOTROUGH, VANCOPEAK, VANCORANDOM, GENTTROUGH, GENTPEAK, GENTRANDOM, TOBRATROUGH, TOBRAPEAK, TOBRARND, AMIKACINPEAK, AMIKACINTROU, AMIKACIN in the last 72 hours.   Microbiology: No results found for this or any previous visit (from the past 720 hour(s)).  Medical History: Past Medical History  Diagnosis Date  . Anxiety   . Hypertension   . Arthritis   . Sleep apnea     wears CPAP nightly  . Glaucoma   . CHF (congestive heart failure) (HCC)     MI, with open heart surgery x 30 sdays ago per pt. (06/26/15)  . S/P CABG (coronary artery bypass graft)   . MI (myocardial infarction) (HCC)     Medications:   (Not in a hospital admission) Assessment: [age] YOM presenting with abscess and fever x 2 days. Pharmacy consulted to start IV vancomycin for Sepsis.  WBC 16.9. Tm 100.4 F. LA 3.18. CrCl > 100 mL/min   Goal of Therapy:  Vancomycin trough level 15-20 mcg/ml  Plan:  -Vancomycin 2 gm IV load followed by 1 gm IV Q 12 hours  -Monitor CBC, renal fx, cultures and clinical progress -[facility]   Vinnie LevelBenjamin Laveda Demedeiros, PharmD., BCPS Clinical Pharmacist Pager [phone]

## 2015-10-16 NOTE — ED Notes (Signed)
Informed consent signed and at bedside  

## 2015-10-16 NOTE — Consult Note (Signed)
Reason for Consult:  Intra-abdominal abscess Referring Physician: Billy Fischer, EDP  Jonathan Le is an 69 y.o. male.  HPI: This is a 68 yo male with a very extensive past medical and surgical history.  Briefly, he underwent CABG on 05/22/15 by Dr. Prescott Gum.  The patient developed gangrenous ischemic colitis and underwent total abdominal colectomy with ileostomy.  His course has been very complicated with multiple abdominal and pelvic abscesses.  The most recent CT scan on 09/18/15 showed that the fluid collections had resolved and the drains were removed.  He has been at [facility] for the last few weeks and recently started having some vague abdominal pain and occasional fever.  His blood pressure has been in the lower 100's.  He has been able to eat and shows no signs of obstruction.  Past Medical History  Diagnosis Date  . Anxiety   . Hypertension   . Arthritis   . Sleep apnea     wears CPAP nightly  . Glaucoma   . CHF (congestive heart failure) (HCC)     MI, with open heart surgery x 30 sdays ago per pt. (06/26/15)  . S/P CABG (coronary artery bypass graft)   . MI (myocardial infarction) [facility])     Past Surgical History  Procedure Laterality Date  . Hernia repair      right  . Eye surgery      bil cataract  . Wisdom tooth extraction    . Fasciotomy foot / toe Left     foot  . Cardiac catheterization N/A 05/20/2015    Procedure: Left Heart Cath and Coronary Angiography;  Surgeon: Burnell Blanks, MD;  Location: [place] [facility];  Service: Cardiovascular;  Laterality: N/A;  . Coronary artery bypass graft N/A 05/22/2015    Procedure: CORONARY ARTERY BYPASS GRAFTING (CABG), ON PUMP, TIMES THREE, USING LEFT INTERNAL MAMMARY ARTERY, RIGHT GREATER SAPHENOUS VEIN HARVESTED ENDOSCOPICALLY;  Surgeon: Ivin Poot, MD;  Location: [place];  Service: Open Heart Surgery;  Laterality: N/A;  LIMA-LAD, SVG-OM, SVG-PD  . Tee without cardioversion N/A 05/22/2015    Procedure: TRANSESOPHAGEAL  ECHOCARDIOGRAM (TEE);  Surgeon: Ivin Poot, MD;  Location: [place];  Service: Open Heart Surgery;  Laterality: N/A;  . Laparotomy N/A 06/18/2015    Procedure: EXPLORATORY LAPAROTOMY;  Surgeon: Rolm Bookbinder, MD;  Location: [place];  Service: General;  Laterality: N/A;  . Laparotomy N/A 06/20/2015    Procedure: WASHOUT OF ABDOMEN, CLOSURE OF ABDOMINAL WALL WITH PLACEMENT OF WOUND VAC AND ILLEOSTOMY ;  Surgeon: Stark Klein, MD;  Location: [place];  Service: General;  Laterality: N/A;  . Tracheostomy tube placement N/A 07/03/2015    Procedure: TRACHEOSTOMY;  Surgeon: Izora Gala, MD;  Location: [place];  Service: ENT;  Laterality: N/A;    History reviewed. No pertinent family history.  Social History:  reports that he has never smoked. He has never used smokeless tobacco. He reports that he does not drink alcohol or use illicit drugs.  Allergies:  Allergies  Allergen Reactions  . Unasyn [Ampicillin-Sulbactam Sodium] Hives, Itching and Rash    Diffuse rash not responding to Pepcid and Benadryl.  TDD.  Marland Kitchen Levaquin [Levofloxacin] Nausea Only  . Penicillins Hives  . Doxycycline Rash    Medications:  Prior to Admission medications   Medication Sig Start Date End Date Taking? Authorizing Provider  acetaminophen (TYLENOL) 325 MG tablet Take 2 tablets (650 mg total) by mouth 2 (two) times daily with breakfast and lunch. 09/24/15  Yes  Ivan Anchors Love, PA-C  Amino Acids-Protein Hydrolys (FEEDING SUPPLEMENT, PRO-STAT SUGAR FREE 64,) LIQD Take 30 mLs by mouth 3 (three) times daily. 09/24/15  Yes Ivan Anchors Love, PA-C  antiseptic oral rinse (BIOTENE) LIQD 15 mLs by Mouth Rinse route 3 (three) times daily after meals. 09/24/15  Yes Ivan Anchors Love, PA-C  diclofenac sodium (VOLTAREN) 1 % GEL Apply 2 g topically 4 (four) times daily. 09/24/15  Yes Ivan Anchors Love, PA-C  famotidine (PEPCID) 20 MG tablet Take 1 tablet (20 mg total) by mouth 2 (two) times daily. 09/24/15  Yes Ivan Anchors Love, PA-C  finasteride (PROSCAR)  5 MG tablet Take 1 tablet (5 mg total) by mouth daily. 09/24/15  Yes Ivan Anchors Love, PA-C  furosemide (LASIX) 40 MG tablet Take 1 tablet (40 mg total) by mouth daily. 09/24/15  Yes Ivan Anchors Love, PA-C  HYDROcodone-acetaminophen (NORCO/VICODIN) 5-325 MG tablet Take 1 tablet by mouth every 6 (six) hours as needed for moderate pain.   Yes Historical Provider, MD  magnesium oxide (MAG-OX) 400 (241.3 MG) MG tablet Take 2 tablets (800 mg total) by mouth 2 (two) times daily. 09/24/15  Yes Ivan Anchors Love, PA-C  Menthol-Methyl Salicylate (MUSCLE RUB) 10-15 % CREA Apply 1 application topically 3 (three) times daily before meals. 09/24/15  Yes Ivan Anchors Love, PA-C  Multiple Vitamins-Minerals (DECUBI-VITE PO) Take 1 tablet by mouth daily.   Yes Historical Provider, MD  ondansetron (ZOFRAN) 4 MG tablet Take 1 tablet (4 mg total) by mouth every 6 (six) hours as needed for nausea. 09/24/15  Yes Ivan Anchors Love, PA-C  pantoprazole (PROTONIX) 40 MG tablet Take 40 mg by mouth daily.   Yes Historical Provider, MD  potassium chloride (K-DUR,KLOR-CON) 10 MEQ tablet Take 30 mEq by mouth 2 (two) times daily.   Yes Historical Provider, MD  PRESCRIPTION MEDICATION Take 120 mLs by mouth 2 (two) times daily. Medpass   Yes Historical Provider, MD  tamsulosin (FLOMAX) 0.4 MG CAPS capsule Take 1 capsule (0.4 mg total) by mouth daily after supper. 09/24/15  Yes Ivan Anchors Love, PA-C  warfarin (COUMADIN) 5 MG tablet Take 1 tablet (5 mg total) by mouth daily at 6 PM. 09/24/15  Yes Bary Leriche, PA-C     Results for orders placed or performed during the hospital encounter of 10/16/15 (from the past 48 hour(s))  Comprehensive metabolic panel     Status: Abnormal   Collection Time: 10/16/15  5:18 PM  Result Value Ref Range   Sodium 130 (L) 135 - 145 mmol/L   Potassium 4.5 3.5 - 5.1 mmol/L   Chloride 100 (L) 101 - 111 mmol/L   CO2 21 (L) 22 - 32 mmol/L   Glucose, Bld 123 (H) 65 - 99 mg/dL   BUN 11 6 - 20 mg/dL   Creatinine, Ser 0.66  0.61 - 1.24 mg/dL   Calcium 8.6 (L) 8.9 - 10.3 mg/dL   Total Protein 5.8 (L) 6.5 - 8.1 g/dL   Albumin 1.5 (L) 3.5 - 5.0 g/dL   AST 27 15 - 41 U/L   ALT 29 17 - 63 U/L   Alkaline Phosphatase 150 (H) 38 - 126 U/L   Total Bilirubin 0.5 0.3 - 1.2 mg/dL   GFR calc non Af Amer >60 >60 mL/min   GFR calc Af Amer >60 >60 mL/min    Comment: (NOTE) The eGFR has been calculated using the CKD EPI equation. This calculation has not been validated in all clinical situations. eGFR's persistently <60 mL/min  signify possible Chronic Kidney Disease.    Anion gap 9 5 - 15  CBC WITH DIFFERENTIAL     Status: Abnormal   Collection Time: 10/16/15  5:18 PM  Result Value Ref Range   WBC 16.9 (H) 4.0 - 10.5 K/uL   RBC 3.43 (L) 4.22 - 5.81 MIL/uL   Hemoglobin 9.5 (L) 13.0 - 17.0 g/dL   HCT 28.8 (L) 39.0 - 52.0 %   MCV 84.0 78.0 - 100.0 fL   MCH 27.7 26.0 - 34.0 pg   MCHC 33.0 30.0 - 36.0 g/dL   RDW 15.0 11.5 - 15.5 %   Platelets 395 150 - 400 K/uL   Neutrophils Relative % 83 %   Lymphocytes Relative 8 %   Monocytes Relative 9 %   Eosinophils Relative 0 %   Basophils Relative 0 %   Neutro Abs 14.0 (H) 1.7 - 7.7 K/uL   Lymphs Abs 1.4 0.7 - 4.0 K/uL   Monocytes Absolute 1.5 (H) 0.1 - 1.0 K/uL   Eosinophils Absolute 0.0 0.0 - 0.7 K/uL   Basophils Absolute 0.0 0.0 - 0.1 K/uL   Smear Review MORPHOLOGY UNREMARKABLE   Lipase, blood     Status: None   Collection Time: 10/16/15  5:18 PM  Result Value Ref Range   Lipase 18 11 - 51 U/L  Protime-INR     Status: Abnormal   Collection Time: 10/16/15  5:18 PM  Result Value Ref Range   Prothrombin Time 26.3 (H) 11.6 - 15.2 seconds   INR 2.45 (H) 0.00 - 1.49  I-Stat CG4 Lactic Acid, ED  (not at  [facility])     Status: Abnormal   Collection Time: 10/16/15  5:50 PM  Result Value Ref Range   Lactic Acid, Venous 3.18 (HH) 0.5 - 2.0 mmol/L   Comment NOTIFIED PHYSICIAN   Type and screen [place]     Status: None   Collection Time: 10/16/15  5:53 PM   Result Value Ref Range   ABO/RH(D) O POS    Antibody Screen NEG    Sample Expiration 10/19/2015     Ct Abdomen Pelvis W Contrast  10/16/2015  CLINICAL DATA:  Fever and abdominal pain.  Partial colectomy. EXAM: CT ABDOMEN AND PELVIS WITH CONTRAST TECHNIQUE: Multidetector CT imaging of the abdomen and pelvis was performed using the standard protocol following bolus administration of intravenous contrast. CONTRAST:  128m ISOVUE-300 IOPAMIDOL (ISOVUE-300) INJECTION 61% COMPARISON:  09/18/2015 FINDINGS: Lower chest and abdominal wall: Continued decrease in left abdominal wall fluid collections, with no residual drainable collection. Percutaneous drains have been removed from the site. 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 A chronic and thick walled left pleural effusion continues to decrease in volume. Underlying left lower lobe scarring is again demonstrated and stable. Extensive atherosclerosis, including the coronary arteries. Hepatobiliary: No focal liver abnormality.No evidence of biliary obstruction or stone. Pancreas: Unremarkable. Spleen: Splenorenal shunt as previously seen. No acute portal venous obstruction. Adrenals/Urinary Tract: Negative adrenals. Bilateral small nonobstructing stones, up to 5 mm. No hydronephrosis or ureteral calculus. Chronic mild bladder wall thickening. No surrounding inflammatory change. Reproductive:Marked enlargement of the prostate, symmetric and stable. Stomach/Bowel: Status post colectomy with stable fluid filling of the Hartman's pouch. The wall of the colonic remnant remains thick, but there is no evidence of surrounding inflammation. There is a large gas and fluid collection with a well-defined rim enhancement in the central abdomen measuring 16 x 10 x 21  cm, consistent with abscess. This is likely in the interloop peritoneum. There are small daughter cystic areas which appear contiguous.  There is also tracking into the anterior abdominal wall with potential loculated areas. Oral contrast reaches a right lower quadrant ileostomy and does not opacify the collection to suggest current bowel leak. No fluid collection seen in this area previously. Vascular/Lymphatic: No acute vascular abnormality. Splenorenal shunt without current acute obstruction. Musculoskeletal: Advanced degenerative disc and facet disease disk with marked sclerosis and disc narrowing at L2-3 and L3-4. Chronic L4-5 anterolisthesis. These results were called by telephone at the time of interpretation on 10/16/2015 at 3:17 pm to [place], who verbally acknowledged these results. IMPRESSION: 1. 16 x 10 x 21 cm central abdominal abscess tracking into the ventral abdominal wall. Oral contrast reaches the patient's ileostomy and does not opacify the abscess to suggest bowel leak/fistula. 2. Left abdominal wall collections seen on comparison study 09/18/2015 have resolved. 3. Chronic complex left pleural effusion is small and decreased. 4. Stable appearance of sacral decubitus ulcer extending to bone. 5. Splenorenal varix. Electronically Signed   By: Monte Fantasia M.D.   On: 10/16/2015 15:30    Review of Systems  Constitutional: Negative for weight loss.  HENT: Negative for ear discharge, ear pain, hearing loss and tinnitus.   Eyes: Negative for blurred vision, double vision, photophobia and pain.  Respiratory: Negative for cough, sputum production and shortness of breath.   Cardiovascular: Negative for chest pain.  Gastrointestinal: Positive for abdominal pain. Negative for nausea and vomiting.  Genitourinary: Negative for dysuria, urgency, frequency and flank pain.  Musculoskeletal: Negative for myalgias, back pain, joint pain, falls and neck pain.  Neurological: Positive for weakness. Negative for dizziness, tingling, sensory change, focal weakness, loss of consciousness and headaches.  Endo/Heme/Allergies: Does not  bruise/bleed easily.  Psychiatric/Behavioral: Negative for depression, memory loss and substance abuse. The patient is not nervous/anxious.    Blood pressure 94/59, pulse 88, temperature 100.4 F (38 C), temperature source Oral, resp. rate 29, height '5\' 9"'  (1.753 m), weight 105.235 kg (232 lb), SpO2 100 %. Physical Exam Obese male - weak, but awake and alert HEENT:  EOMI, sclera anicteric Neck:  No masses, no thyromegaly Lungs:  CTA bilaterally; normal respiratory effort CV:  Regular rate and rhythm; no murmurs Abd:  +bowel sounds, obese, soft; RLQ ileostomy with thin liquid drainage (contrast) Midline incision healed with no sign of infection Ext:  Well-perfused; no edema Skin:  Warm, dry; no sign of jaundice  Assessment/Plan: 1.  Recurrent intra-abdominal abscess 2.  No sign of obstruction or fistula 3.  Sacral decubitus ulcer - stage IV 4.  Anticoagulation  Recommendations:   1.  IV antibiotics/ IV hydration 2.  Reverse anticoagulation with FFP - Vitamin K will not work quickly enough 3.  NPO 4.  Percutaneous drainage when INR reversed - I have spoken to Dr. Kathlene Cote already  Surgery will follow.   Shanekia Latella K. 10/16/2015, 7:21 PM

## 2015-10-16 NOTE — ED Notes (Signed)
Activated code sepsis  

## 2015-10-16 NOTE — ED Notes (Signed)
[facility] called for pt transport to [place]

## 2015-10-16 NOTE — ED Notes (Signed)
Admitting MD at bedside.

## 2015-10-17 ENCOUNTER — Inpatient Hospital Stay (HOSPITAL_COMMUNITY): Payer: Medicare Other

## 2015-10-17 ENCOUNTER — Encounter (HOSPITAL_COMMUNITY): Payer: Self-pay | Admitting: Radiology

## 2015-10-17 DIAGNOSIS — A419 Sepsis, unspecified organism: Principal | ICD-10-CM

## 2015-10-17 DIAGNOSIS — R6521 Severe sepsis with septic shock: Secondary | ICD-10-CM

## 2015-10-17 DIAGNOSIS — K651 Peritoneal abscess: Secondary | ICD-10-CM

## 2015-10-17 DIAGNOSIS — J810 Acute pulmonary edema: Secondary | ICD-10-CM

## 2015-10-17 DIAGNOSIS — I509 Heart failure, unspecified: Secondary | ICD-10-CM | POA: Insufficient documentation

## 2015-10-17 LAB — CBC
HEMATOCRIT: 27.1 % — AB (ref 39.0–52.0)
HEMOGLOBIN: 8.7 g/dL — AB (ref 13.0–17.0)
MCH: 27.4 pg (ref 26.0–34.0)
MCHC: 32.1 g/dL (ref 30.0–36.0)
MCV: 85.5 fL (ref 78.0–100.0)
Platelets: 380 10*3/uL (ref 150–400)
RBC: 3.17 MIL/uL — ABNORMAL LOW (ref 4.22–5.81)
RDW: 15 % (ref 11.5–15.5)
WBC: 11.6 10*3/uL — ABNORMAL HIGH (ref 4.0–10.5)

## 2015-10-17 LAB — BASIC METABOLIC PANEL
ANION GAP: 8 (ref 5–15)
BUN: 12 mg/dL (ref 6–20)
CHLORIDE: 107 mmol/L (ref 101–111)
CO2: 20 mmol/L — AB (ref 22–32)
Calcium: 8.4 mg/dL — ABNORMAL LOW (ref 8.9–10.3)
Creatinine, Ser: 0.4 mg/dL — ABNORMAL LOW (ref 0.61–1.24)
GFR calc Af Amer: >60 mL/min (ref >60)
GLUCOSE: 108 mg/dL — AB (ref 65–99)
POTASSIUM: 3.6 mmol/L (ref 3.5–5.1)
Sodium: 135 mmol/L (ref 135–145)

## 2015-10-17 LAB — ABO/RH: ABO/RH(D): O POS

## 2015-10-17 LAB — PROTIME-INR
INR: 2.19 — AB (ref 0.00–1.49)
Prothrombin Time: 24.2 seconds — ABNORMAL HIGH (ref 11.6–15.2)

## 2015-10-17 LAB — LACTIC ACID, PLASMA
LACTIC ACID, VENOUS: 2.1 mmol/L — AB (ref 0.5–2.0)
Lactic Acid, Venous: 1.2 mmol/L (ref 0.5–2.0)
Lactic Acid, Venous: 1.3 mmol/L (ref 0.5–2.0)

## 2015-10-17 LAB — TYPE AND SCREEN
ABO/RH(D): O POS
Antibody Screen: NEGATIVE

## 2015-10-17 MED ORDER — SODIUM CHLORIDE 0.9 % IV BOLUS (SEPSIS)
500.0000 mL | Freq: Once | INTRAVENOUS | Status: AC
  Administered 2015-10-17: 500 mL via INTRAVENOUS

## 2015-10-17 MED ORDER — ONDANSETRON HCL 4 MG PO TABS
4.0000 mg | ORAL_TABLET | Freq: Four times a day (QID) | ORAL | Status: DC | PRN
Start: 2015-10-17 — End: 2015-10-30

## 2015-10-17 MED ORDER — FENTANYL CITRATE (PF) 100 MCG/2ML IJ SOLN
INTRAMUSCULAR | Status: AC | PRN
  Administered 2015-10-17: 50 ug via INTRAVENOUS

## 2015-10-17 MED ORDER — BIOTENE DRY MOUTH MT LIQD
15.0000 mL | Freq: Three times a day (TID) | OROMUCOSAL | Status: DC
  Administered 2015-10-17 – 2015-10-30 (×26): 15 mL via OROMUCOSAL

## 2015-10-17 MED ORDER — OXYCODONE HCL 5 MG PO TABS
5.0000 mg | ORAL_TABLET | ORAL | Status: DC | PRN
  Administered 2015-10-18 – 2015-10-22 (×2): 5 mg via ORAL
  Filled 2015-10-17 (×3): qty 1

## 2015-10-17 MED ORDER — ACETAMINOPHEN 325 MG PO TABS
650.0000 mg | ORAL_TABLET | Freq: Four times a day (QID) | ORAL | Status: DC | PRN
Start: 2015-10-17 — End: 2015-10-30
  Administered 2015-10-24 (×2): 650 mg via ORAL
  Filled 2015-10-17 (×2): qty 2

## 2015-10-17 MED ORDER — PANTOPRAZOLE SODIUM 40 MG PO TBEC
40.0000 mg | DELAYED_RELEASE_TABLET | Freq: Every day | ORAL | Status: DC
  Administered 2015-10-17 – 2015-10-30 (×14): 40 mg via ORAL
  Filled 2015-10-17 (×15): qty 1

## 2015-10-17 MED ORDER — ALUM & MAG HYDROXIDE-SIMETH 200-200-20 MG/5ML PO SUSP
30.0000 mL | Freq: Four times a day (QID) | ORAL | Status: DC | PRN
Start: 2015-10-17 — End: 2015-10-30

## 2015-10-17 MED ORDER — SODIUM CHLORIDE 0.9 % IV BOLUS (SEPSIS)
250.0000 mL | Freq: Once | INTRAVENOUS | Status: AC
  Administered 2015-10-17: 250 mL via INTRAVENOUS

## 2015-10-17 MED ORDER — PRO-STAT SUGAR FREE PO LIQD
30.0000 mL | Freq: Three times a day (TID) | ORAL | Status: DC
  Administered 2015-10-19: 30 mL via ORAL
  Filled 2015-10-17 (×3): qty 30

## 2015-10-17 MED ORDER — ACETAMINOPHEN 500 MG PO TABS
1000.0000 mg | ORAL_TABLET | Freq: Once | ORAL | Status: AC
  Administered 2015-10-17: 1000 mg via ORAL
  Filled 2015-10-17: qty 2

## 2015-10-17 MED ORDER — ONDANSETRON HCL 4 MG/2ML IJ SOLN: 4.0000 mg | Freq: Four times a day (QID) | INTRAMUSCULAR | Status: DC | PRN

## 2015-10-17 MED ORDER — MAGNESIUM OXIDE 400 (241.3 MG) MG PO TABS
800.0000 mg | ORAL_TABLET | Freq: Two times a day (BID) | ORAL | Status: DC
  Administered 2015-10-17 – 2015-10-30 (×28): 800 mg via ORAL
  Filled 2015-10-17 (×28): qty 2

## 2015-10-17 MED ORDER — SODIUM CHLORIDE 0.9 % IV SOLN
INTRAVENOUS | Status: DC
  Administered 2015-10-17: 75 mL/h via INTRAVENOUS
  Administered 2015-10-17 – 2015-10-18 (×2): via INTRAVENOUS

## 2015-10-17 MED ORDER — FINASTERIDE 5 MG PO TABS: 5.0000 mg | ORAL_TABLET | Freq: Every day | ORAL | Status: DC

## 2015-10-17 MED ORDER — SODIUM CHLORIDE 0.9 % IV SOLN: Freq: Once | INTRAVENOUS | Status: DC

## 2015-10-17 MED ORDER — FENTANYL CITRATE (PF) 100 MCG/2ML IJ SOLN
INTRAMUSCULAR | Status: AC
  Filled 2015-10-17: qty 4

## 2015-10-17 MED ORDER — MIDAZOLAM HCL 2 MG/2ML IJ SOLN
INTRAMUSCULAR | Status: AC
  Filled 2015-10-17: qty 4

## 2015-10-17 MED ORDER — SODIUM CHLORIDE 0.9 % IV SOLN
INTRAVENOUS | Status: DC
  Administered 2015-10-19: 07:00:00 via INTRAVENOUS

## 2015-10-17 MED ORDER — DEXTROSE 5 % IV SOLN
2.0000 g | INTRAVENOUS | Status: DC
  Administered 2015-10-17: 2 g via INTRAVENOUS
  Filled 2015-10-17: qty 2

## 2015-10-17 MED ORDER — POTASSIUM CHLORIDE CRYS ER 20 MEQ PO TBCR
30.0000 meq | EXTENDED_RELEASE_TABLET | Freq: Two times a day (BID) | ORAL | Status: DC
  Administered 2015-10-17 – 2015-10-30 (×27): 30 meq via ORAL
  Filled 2015-10-17 (×27): qty 1

## 2015-10-17 MED ORDER — HYDROMORPHONE HCL 1 MG/ML IJ SOLN
0.5000 mg | INTRAMUSCULAR | Status: DC | PRN
  Administered 2015-10-24 – 2015-10-29 (×8): 1 mg via INTRAVENOUS
  Filled 2015-10-17 (×9): qty 1

## 2015-10-17 MED ORDER — MIDAZOLAM HCL 5 MG/5ML IJ SOLN
INTRAMUSCULAR | Status: AC | PRN
  Administered 2015-10-17: 1 mg via INTRAVENOUS

## 2015-10-17 MED ORDER — ADULT MULTIVITAMIN W/MINERALS CH
1.0000 | ORAL_TABLET | Freq: Every day | ORAL | Status: DC
Start: 2015-10-17 — End: 2015-10-30
  Administered 2015-10-17 – 2015-10-30 (×14): 1 via ORAL
  Filled 2015-10-17 (×15): qty 1

## 2015-10-17 MED ORDER — ACETAMINOPHEN 650 MG RE SUPP
650.0000 mg | Freq: Four times a day (QID) | RECTAL | Status: DC | PRN
Start: 2015-10-17 — End: 2015-10-30
  Administered 2015-10-18: 650 mg via RECTAL
  Filled 2015-10-17: qty 1

## 2015-10-17 NOTE — Procedures (Signed)
Interventional Radiology Procedure Note  Procedure:  CT guided drainage of peritoneal abscess  Complications:  None  Estimated Blood Loss:  < 10 mL  Grossly purulent fluid aspirated from peritoneal abscess.  Sample sent for culture studies. 12 Fr percutaneous drain placed.  Attached to suction bulb drainage.  Jodi MarbleGlenn T. Fredia SorrowYamagata, M.D Pager:  [phone]

## 2015-10-17 NOTE — Progress Notes (Addendum)
[facility] PROGRESS NOTE  Jonathan Le ZOX:[id number] DOB: 1946/05/16 DOA: 10/16/2015 PCP: Elizabeth Palau, FNP   Brief narrative 69 year old male with medical history of CAD status post CABG in July 2016, CHF with EF of 30-35 %, hypertension and history of DVT on Coumadin, obstructive sleep apnea on CPAP, glaucoma, development of ischemic colitis in August 2016 for which he underwent excoriated a laparotomy with total colectomy and ileostomy with prolonged intubation requiring tracheostomy, followed by a complicated course with multiple abdominal and pelvic abscesses that have been drained. His last CT scan from 09/18/2015 showed fluid collection to have resolved and drains were removed. Patient was then sent to [facility]. For the last 2 weeks he was having vague abdominal pain with occasional fever. He was found to have a UTI and was started on antibiotics. However for the past 2 days he was having low blood pressure. He was sent for outpatient CT scan of the abdomen which showed a large central abdominal abscess measuring 161021 cm and was sent to the ED. Patient was found to be in septic shock with fever of 102.64F, tachycardic, hypotensive (blood pressure 83/37 mmHg) and significant leukocytosis (16.9 K). Patient admitted to stepdown unit on empiric antibiotics. General surgery consulted recommended IR drainage of the abdominal abscess.  3 units FFP given to reverse his INR.   Assessment/Plan: Septic shock Secondary to intra-abdominal abscess. Patient hypotensive and given his cardiomyopathy with leg edema cannot use aggressive IV hydration. Surgery following. MAP of 50 this morning. Antihypertensives on hold. Consulted PCCM for evaluation. May need vasopressin. -Continue empiric vancomycin, Rocephin and Flagyl. Plan on CT drainage of the abdominal abscess by IR today. -Monitor CBC, strict I/O. Lactic acid normalized. Follow cultures. -Continue step down  monitoring.  CAD status post CABG with cardiomyopathy Patient had NSTEMI in July 2016 with cardiogenic shock . Three-vessel disease seen on cardiac cath with EF of 30-35% He underwent CABG. Currently has some volume overload. Lasix on hold. Getting gentle hydration for hypotension. Monitor strict I/O. Will consult cardiology if necessary.  Abdominal pain Secondary to abscess. Currently symptomatic. Continue when necessary pain meds  Stage IV sacral decubitus ulcer Appears unchanged. Wound care consulted.  Anemia of chronic disease Stable.  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 Warfarin on hold. Given 3 unit FFP for reversal.  INR therapeutic this morning.  OSA Continue nighttime CPAP  Diet: Nothing by mouth DVT prophylaxis: Therapeutic INR  Code Status: Full code Family Communication: None at bedside Disposition Plan: [facility] step down monitoring.   Consultants:  CCS  PCCM  IR  Procedures:  CT drainage of abdominal abscess (schedules)  Antibiotics:  IV vancomycin, Rocephin and Flagyl (12/2--)  HPI/Subjective: Seen and examined. Denies abdominal pain, nausea or vomiting. Remains afebrile this morning. Patient hypertensive with low MAP.   Objective: Filed Vitals:   10/17/15 0955 10/17/15 1000  BP: 78/48 75/37  Pulse:    Temp:    Resp: 21 24    Intake/Output Summary (Last 24 hours) at 10/17/15 1041 Last data filed at 10/17/15 1000  Gross per 24 hour  Intake 4869.5 ml  Output   1675 ml  Net 3194.5 ml   Filed Weights   10/16/15 1704  Weight: 105.235 kg (232 lb)    Exam:   General: Elderly male lying in bed not in distress, appears pale  HEENT: All are present, dry oral mucosa, supple neck, no JVD  Chest: Fine basilar crackles, no rhonchi or wheeze  Cardiovascular: S1  and S2, no murmurs rub or gallop  GI: Soft, nondistended, nontender, ileostomy bag in place with some clear stool, bowel sounds  present  Musculoskeletal: Warm, 1+ pitting edema bilaterally, stage IV sacral decubitus  CNS: Alert and oriented  Data Reviewed: Basic Metabolic Panel:  Recent Labs Lab 10/16/15 1718 10/17/15 0348  NA 130* 135  K 4.5 3.6  CL 100* 107  CO2 21* 20*  GLUCOSE 123* 108*  BUN 11 12  CREATININE 0.66 0.40*  CALCIUM 8.6* 8.4*   Liver Function Tests:  Recent Labs Lab 10/16/15 1718  AST 27  ALT 29  ALKPHOS 150*  BILITOT 0.5  PROT 5.8*  ALBUMIN 1.5*    Recent Labs Lab 10/16/15 1718  LIPASE 18   No results for input(s): AMMONIA in the last 168 hours. CBC:  Recent Labs Lab 10/16/15 1718 10/17/15 0348  WBC 16.9* 11.6*  NEUTROABS 14.0*  --   HGB 9.5* 8.7*  HCT 28.8* 27.1*  MCV 84.0 85.5  PLT 395 380   Cardiac Enzymes: No results for input(s): CKTOTAL, CKMB, CKMBINDEX, TROPONINI in the last 168 hours. BNP (last 3 results)  Recent Labs  07/28/15 0500 08/02/15 0620 08/16/15 0701  BNP 453.8* 367.7* 528.3*    ProBNP (last 3 results) No results for input(s): PROBNP in the last 8760 hours.  CBG: No results for input(s): GLUCAP in the last 168 hours.  No results found for this or any previous visit (from the past 240 hour(s)).   Studies: Ct Abdomen Pelvis W Contrast  10/16/2015  CLINICAL DATA:  Fever and abdominal pain.  Partial colectomy. EXAM: CT ABDOMEN AND PELVIS WITH CONTRAST TECHNIQUE: Multidetector CT imaging of the abdomen and pelvis was performed using the standard protocol following bolus administration of intravenous contrast. CONTRAST:  ISOVUE-300 IOPAMIDOL (ISOVUE-300) INJECTION 61% COMPARISON:  09/18/2015 FINDINGS: Lower chest and abdominal wall: Continued decrease in left abdominal wall fluid collections, with no residual drainable collection. Percutaneous drains have been removed from the site. 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 A chronic and thick walled left  pleural effusion continues to decrease in volume. Underlying left lower lobe scarring is again demonstrated and stable. Extensive atherosclerosis, including the coronary arteries. Hepatobiliary: No focal liver abnormality.No evidence of biliary obstruction or stone. Pancreas: Unremarkable. Spleen: Splenorenal shunt as previously seen. No acute portal venous obstruction. Adrenals/Urinary Tract: Negative adrenals. Bilateral small nonobstructing stones, up to 5 mm. No hydronephrosis or ureteral calculus. Chronic mild bladder wall thickening. No surrounding inflammatory change. Reproductive:Marked enlargement of the prostate, symmetric and stable. Stomach/Bowel: Status post colectomy with stable fluid filling of the Hartman's pouch. The wall of the colonic remnant remains thick, but there is no evidence of surrounding inflammation. There is a large gas and fluid collection with a well-defined rim enhancement in the central abdomen measuring 16 x 10 x 21 cm, consistent with abscess. This is likely in the interloop peritoneum. There are small daughter cystic areas which appear contiguous. There is also tracking into the anterior abdominal wall with potential loculated areas. Oral contrast reaches a right lower quadrant ileostomy and does not opacify the collection to suggest current bowel leak. No fluid collection seen in this area previously. Vascular/Lymphatic: No acute vascular abnormality. Splenorenal shunt without current acute obstruction. Musculoskeletal: Advanced degenerative disc and facet disease disk with marked sclerosis and disc narrowing at L2-3 and L3-4. Chronic L4-5 anterolisthesis. These results were called by telephone at the time of interpretation on 10/16/2015 at  3:17 pm to [facility] E[facility]kine SquibbJane, who verbally acknowledged these results. IMPRESSION: 1. 16 x 10 x 21 cm central abdominal abscess tracking into the ventral abdominal wall. Oral contrast reaches the patient's ileostomy and does not opacify the abscess to  suggest bowel leak/fistula. 2. Left abdominal wall collections seen on comparison study 09/18/2015 have resolved. 3. Chronic complex left pleural effusion is small and decreased. 4. Stable appearance of sacral decubitus ulcer extending to bone. 5. Splenorenal varix. Electronically Signed   By: Marnee SpringJonathon  Watts M.D.   On: 10/16/2015 15:30    Scheduled Meds: . antiseptic oral rinse  15 mL Mouth Rinse TID PC  . cefTRIAXone (ROCEPHIN)  IV  2 g Intravenous Q24H  . feeding supplement (PRO-STAT SUGAR FREE 64)  30 mL Oral TID  . finasteride  5 mg Oral Daily  . magnesium oxide  800 mg Oral BID  . metronidazole  500 mg Intravenous Q8H  . multivitamin with minerals  1 tablet Oral Daily  . pantoprazole  40 mg Oral Daily  . potassium chloride  30 mEq Oral BID  . vancomycin  1,000 mg Intravenous Q12H   Continuous Infusions: . sodium chloride    . sodium chloride 75 mL/hr at 10/17/15 0326      Time spent: 35 minutes    Dreshon Proffit  [facility] Pager [phone]. If 7PM-7AM, please contact night-coverage at [email or web address], password [email or web address] 10/17/2015, 10:41 AM  LOS: 1 day

## 2015-10-17 NOTE — Progress Notes (Signed)
Extremely foul smelling, milky green, copious secretions, draining from JP drain.

## 2015-10-17 NOTE — Consult Note (Signed)
PULMONARY / CRITICAL CARE MEDICINE   Name: Jonathan Le MRN: [id number] DOB: Feb 01, 1946    ADMISSION DATE:  10/16/2015 CONSULTATION DATE:  10/17/2015  REFERRING MD:  [facility]-MD  CHIEF COMPLAINT:  Septic shock  HISTORY OF PRESENT ILLNESS:   69 year old male with multiple abdominal abscess and CHF with EF of 25% who came to the hospital for fevers and chills.  Found to have multiple abdominal abscess unknown etiology.  Going to IR for drainage today after being evaluated for surgery.  PCCM consulted for hypotension that did not respond to 750 ml of IVF.  Patient currently complain only of abdominal pain.  PAST MEDICAL HISTORY :  He  has a past medical history of Anxiety; Hypertension; Arthritis; Sleep apnea; Glaucoma; CHF (congestive heart failure) (HCC); S/P CABG (coronary artery bypass graft); and MI (myocardial infarction) (HCC).  PAST SURGICAL HISTORY: He  has past surgical history that includes Hernia repair; Eye surgery; Wisdom tooth extraction; Fasciotomy foot / toe (Left); Cardiac catheterization (N/A, 05/20/2015); Coronary artery bypass graft (N/A, 05/22/2015); TEE without cardioversion (N/A, 05/22/2015); laparotomy (N/A, 06/18/2015); laparotomy (N/A, 06/20/2015); and Tracheostomy tube placement (N/A, 07/03/2015).  Allergies  Allergen Reactions  . Unasyn [Ampicillin-Sulbactam Sodium] Hives, Itching and Rash    Diffuse rash not responding to Pepcid and Benadryl.  TDD.  Marland Kitchen Levaquin [Levofloxacin] Nausea Only  . Penicillins Hives  . Doxycycline Rash    No current facility-administered medications on file prior to encounter.   Current Outpatient Prescriptions on File Prior to Encounter  Medication Sig  . acetaminophen (TYLENOL) 325 MG tablet Take 2 tablets (650 mg total) by mouth 2 (two) times daily with breakfast and lunch.  . Amino Acids-Protein Hydrolys (FEEDING SUPPLEMENT, PRO-STAT SUGAR FREE 64,) LIQD Take 30 mLs by mouth 3 (three) times daily.  Marland Kitchen antiseptic oral rinse (BIOTENE) LIQD 15  mLs by Mouth Rinse route 3 (three) times daily after meals.  . diclofenac sodium (VOLTAREN) 1 % GEL Apply 2 g topically 4 (four) times daily.  . famotidine (PEPCID) 20 MG tablet Take 1 tablet (20 mg total) by mouth 2 (two) times daily.  . finasteride (PROSCAR) 5 MG tablet Take 1 tablet (5 mg total) by mouth daily.  . furosemide (LASIX) 40 MG tablet Take 1 tablet (40 mg total) by mouth daily.  . magnesium oxide (MAG-OX) 400 (241.3 MG) MG tablet Take 2 tablets (800 mg total) by mouth 2 (two) times daily.  . Menthol-Methyl Salicylate (MUSCLE RUB) 10-15 % CREA Apply 1 application topically 3 (three) times daily before meals.  . ondansetron (ZOFRAN) 4 MG tablet Take 1 tablet (4 mg total) by mouth every 6 (six) hours as needed for nausea.  . tamsulosin (FLOMAX) 0.4 MG CAPS capsule Take 1 capsule (0.4 mg total) by mouth daily after supper.  . warfarin (COUMADIN) 5 MG tablet Take 1 tablet (5 mg total) by mouth daily at 6 PM.    FAMILY HISTORY:  His indicated that his mother is deceased. He indicated that his father is deceased.   SOCIAL HISTORY: He  reports that he has never smoked. He has never used smokeless tobacco. He reports that he does not drink alcohol or use illicit drugs.  REVIEW OF SYSTEMS:   12 point ROS is negative other than above.  SUBJECTIVE:  Feels well, just concerned about recurrence of these abscess.  VITAL SIGNS: BP 90/45 mmHg  Pulse 93  Temp(Src) 97.9 F (36.6 C) (Oral)  Resp 19  Ht  (1.753 m)  Wt 105.235  kg (232 lb)  BMI 34.24 kg/m2  SpO2 96%  HEMODYNAMICS:    VENTILATOR SETTINGS:    INTAKE / OUTPUT: I/O last 3 completed shifts: In: 4344.5 [I.V.:2847.5; Blood:1397; IV Piggyback:100] Out: 1675 [Urine:350; Emesis/NG output:225; Other:600; Stool:500]  PHYSICAL EXAMINATION: General:  Chronically ill appearing male, NAD Neuro:  Alert and interactive, moving all ext to command. HEENT:  [place]/AT, PERRL, EOM-I and MMM. Cardiovascular:  RRR, Nl S1/S2,  -M/R/G. Lungs:  CTA bilaterally. Abdomen:  Soft, NT, ND and +BS. Musculoskeletal:  -edema and -tenderness. Skin:  Intact.  LABS:  BMET  Recent Labs Lab 10/16/15 1718 10/17/15 0348  NA 130* 135  K 4.5 3.6  CL 100* 107  CO2 21* 20*  BUN 11 12  CREATININE 0.66 0.40*  GLUCOSE 123* 108*    Electrolytes  Recent Labs Lab 10/16/15 1718 10/17/15 0348  CALCIUM 8.6* 8.4*    CBC  Recent Labs Lab 10/16/15 1718 10/17/15 0348  WBC 16.9* 11.6*  HGB 9.5* 8.7*  HCT 28.8* 27.1*  PLT 395 380    Coag's  Recent Labs Lab 10/16/15 1718 10/17/15 0847  INR 2.45* 2.19*    Sepsis Markers  Recent Labs Lab 10/17/15 0120 10/17/15 0847 10/17/15 1115  LATICACIDVEN 2.1* 1.2 1.3    ABG No results for input(s): PHART, PCO2ART, PO2ART in the last 168 hours.  Liver Enzymes  Recent Labs Lab 10/16/15 1718  AST 27  ALT 29  ALKPHOS 150*  BILITOT 0.5  ALBUMIN 1.5*    Cardiac Enzymes No results for input(s): TROPONINI, PROBNP in the last 168 hours.  Glucose No results for input(s): GLUCAP in the last 168 hours.  Imaging Ct Abdomen Pelvis W Contrast  10/16/2015  CLINICAL DATA:  Fever and abdominal pain.  Partial colectomy. EXAM: CT ABDOMEN AND PELVIS WITH CONTRAST TECHNIQUE: Multidetector CT imaging of the abdomen and pelvis was performed using the standard protocol following bolus administration of intravenous contrast. CONTRAST:  ISOVUE-300 IOPAMIDOL (ISOVUE-300) INJECTION 61% COMPARISON:  09/18/2015 FINDINGS: Lower chest and abdominal wall: Continued decrease in left abdominal wall fluid collections, with no residual drainable collection. Percutaneous drains have been removed from the site. 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 A chronic and thick walled left pleural effusion continues to decrease in volume. Underlying left lower lobe scarring is again demonstrated and stable. Extensive  atherosclerosis, including the coronary arteries. Hepatobiliary: No focal liver abnormality.No evidence of biliary obstruction or stone. Pancreas: Unremarkable. Spleen: Splenorenal shunt as previously seen. No acute portal venous obstruction. Adrenals/Urinary Tract: Negative adrenals. Bilateral small nonobstructing stones, up to 5 mm. No hydronephrosis or ureteral calculus. Chronic mild bladder wall thickening. No surrounding inflammatory change. Reproductive:Marked enlargement of the prostate, symmetric and stable. Stomach/Bowel: Status post colectomy with stable fluid filling of the Hartman's pouch. The wall of the colonic remnant remains thick, but there is no evidence of surrounding inflammation. There is a large gas and fluid collection with a well-defined rim enhancement in the central abdomen measuring 16 x 10 x 21 cm, consistent with abscess. This is likely in the interloop peritoneum. There are small daughter cystic areas which appear contiguous. There is also tracking into the anterior abdominal wall with potential loculated areas. Oral contrast reaches a right lower quadrant ileostomy and does not opacify the collection to suggest current bowel leak. No fluid collection seen in this area previously. Vascular/Lymphatic: No acute vascular abnormality. Splenorenal shunt without current acute obstruction. Musculoskeletal: Advanced degenerative disc and facet disease  disk with marked sclerosis and disc narrowing at L2-3 and L3-4. Chronic L4-5 anterolisthesis. These results were called by telephone at the time of interpretation on 10/16/2015 at 3:17 pm to [facility] Erskine SquibbJane, who verbally acknowledged these results. IMPRESSION: 1. 16 x 10 x 21 cm central abdominal abscess tracking into the ventral abdominal wall. Oral contrast reaches the patient's ileostomy and does not opacify the abscess to suggest bowel leak/fistula. 2. Left abdominal wall collections seen on comparison study 09/18/2015 have resolved. 3. Chronic  complex left pleural effusion is small and decreased. 4. Stable appearance of sacral decubitus ulcer extending to bone. 5. Splenorenal varix. Electronically Signed   By: Marnee SpringJonathon  Watts M.D.   On: 10/16/2015 15:30     STUDIES:  CT of the abdomen with multiple abscess.  CULTURES: Blood 12/2>>> Urine 12/2>>>  ANTIBIOTICS: Rocephin 12/2>>> Vanc 12/2>>> Flagyl 12/2>>>  SIGNIFICANT EVENTS: 12/3 to IR for a drain  LINES/TUBES: PIV  I reviewed the abdominal CT myself, abscess noted.  Discussed with [facility]-MD  DISCUSSION: 69 year old male with abdominal abscess going for a drain today, consult for hypotension in the 70s in a patient with CHF.  Patient is mentating well and making urine.  Repeat SBP is 93 systolic and patient feels well.  ASSESSMENT / PLAN:  - Gentle IV hydration. - Continue abx as ordered. - If worsens will need TLC and pressors. - Monitor for signs of fluid overload given CHF history. - F/U on culture. - Check PCT if worsens.   - What will be more telling than BP is UOP and mental status as BP changes dramatically with patient being asleep or awake as well as position.  PCCM will follow.  Alyson ReedyWesam G. Tyhesha Dutson, M.D. [facility]. Pager: [phone]. After hours pager: [phone].  10/17/2015, 12:09 PM

## 2015-10-17 NOTE — H&P (Signed)
Chief Complaint: Patient was seen in consultation today for  Chief Complaint  Patient presents with  . Abscess  Tsuei  Referring Physician(s): Tsuei  History of Present Illness: Jonathan Le is a 69 y.o. male with a history of CAD S/P CABG, CHF, HTN, Hx DVT on Coumadin, and OSA.  He underwent CABG on 05/22/15 by Dr. Donata Clay. Then he developed gangrenous ischemic colitis and underwent total abdominal colectomy with ileostomy on June 18, 2015.  His course has been very complicated with multiple abdominal and pelvic abscesses.  He underwent a CT guided drain placements by Dr. Bonnielee Haff 07/21/2015  The most recent CT scan on 09/18/15 showed that the fluid collections had resolved and the drains were removed.   He was sent from the [facility] due to fevers and chills for the last 2 weeks despite antibiotics for a UTI.  He has had low blood pressures for the past 2 days.   He was sent for an outpatient CT scan which revealed a large abscess 16 x 10 x 21 cm and he was sent to the ED for admission and treatment.  He was seen by General Surgery Dr Corliss Skains, and placed on IV Vancomycin , Rocephin and Metronidazole , and administered FFP.  Repeat INR has been drawn.  Currently, he denies abdominal pain.  He only c/o "being cold"  He also denies N/V.  Past Medical History  Diagnosis Date  . Anxiety   . Hypertension   . Arthritis   . Sleep apnea     wears CPAP nightly  . Glaucoma   . CHF (congestive heart failure) (HCC)     MI, with open heart surgery x 30 sdays ago per pt. (06/26/15)  . S/P CABG (coronary artery bypass graft)   . MI (myocardial infarction) [facility])     Past Surgical History  Procedure Laterality Date  . Hernia repair      right  . Eye surgery      bil cataract  . Wisdom tooth extraction    . Fasciotomy foot / toe Left     foot  . Cardiac catheterization N/A 05/20/2015    Procedure: Left Heart Cath and Coronary Angiography;  Surgeon:  Kathleene Hazel, MD;  Location: [facility] INVASIVE CV LAB;  Service: Cardiovascular;  Laterality: N/A;  . Coronary artery bypass graft N/A 05/22/2015    Procedure: CORONARY ARTERY BYPASS GRAFTING (CABG), ON PUMP, TIMES THREE, USING LEFT INTERNAL MAMMARY ARTERY, RIGHT GREATER SAPHENOUS VEIN HARVESTED ENDOSCOPICALLY;  Surgeon: Kerin Perna, MD;  Location: [facility] OR;  Service: Open Heart Surgery;  Laterality: N/A;  LIMA-LAD, SVG-OM, SVG-PD  . Tee without cardioversion N/A 05/22/2015    Procedure: TRANSESOPHAGEAL ECHOCARDIOGRAM (TEE);  Surgeon: Kerin Perna, MD;  Location: [facility] OR;  Service: Open Heart Surgery;  Laterality: N/A;  . Laparotomy N/A 06/18/2015    Procedure: EXPLORATORY LAPAROTOMY;  Surgeon: Emelia Loron, MD;  Location: [facility] OR;  Service: General;  Laterality: N/A;  . Laparotomy N/A 06/20/2015    Procedure: WASHOUT OF ABDOMEN, CLOSURE OF ABDOMINAL WALL WITH PLACEMENT OF WOUND VAC AND ILLEOSTOMY ;  Surgeon: Almond Lint, MD;  Location: [facility] OR;  Service: General;  Laterality: N/A;  . Tracheostomy tube placement N/A 07/03/2015    Procedure: TRACHEOSTOMY;  Surgeon: Serena Colonel, MD;  Location: [facility] OR;  Service: ENT;  Laterality: N/A;    Allergies: Unasyn; Levaquin; Penicillins; and Doxycycline  Medications: Prior to Admission medications   Medication Sig Start Date End Date  Taking? Authorizing Provider  acetaminophen (TYLENOL) 325 MG tablet Take 2 tablets (650 mg total) by mouth 2 (two) times daily with breakfast and lunch. 09/24/15  Yes Evlyn KannerPamela S Love, PA-C  Amino Acids-Protein Hydrolys (FEEDING SUPPLEMENT, PRO-STAT SUGAR FREE 64,) LIQD Take 30 mLs by mouth 3 (three) times daily. 09/24/15  Yes Evlyn KannerPamela S Love, PA-C  antiseptic oral rinse (BIOTENE) LIQD 15 mLs by Mouth Rinse route 3 (three) times daily after meals. 09/24/15  Yes Evlyn KannerPamela S Love, PA-C  diclofenac sodium (VOLTAREN) 1 % GEL Apply 2 g topically 4 (four) times daily. 09/24/15  Yes Evlyn KannerPamela S Love, PA-C  famotidine (PEPCID) 20 MG tablet Take 1  tablet (20 mg total) by mouth 2 (two) times daily. 09/24/15  Yes Evlyn KannerPamela S Love, PA-C  finasteride (PROSCAR) 5 MG tablet Take 1 tablet (5 mg total) by mouth daily. 09/24/15  Yes Evlyn KannerPamela S Love, PA-C  furosemide (LASIX) 40 MG tablet Take 1 tablet (40 mg total) by mouth daily. 09/24/15  Yes Evlyn KannerPamela S Love, PA-C  HYDROcodone-acetaminophen (NORCO/VICODIN) 5-325 MG tablet Take 1 tablet by mouth every 6 (six) hours as needed for moderate pain.   Yes Historical Provider, MD  magnesium oxide (MAG-OX) 400 (241.3 MG) MG tablet Take 2 tablets (800 mg total) by mouth 2 (two) times daily. 09/24/15  Yes Evlyn KannerPamela S Love, PA-C  Menthol-Methyl Salicylate (MUSCLE RUB) 10-15 % CREA Apply 1 application topically 3 (three) times daily before meals. 09/24/15  Yes Evlyn KannerPamela S Love, PA-C  Multiple Vitamins-Minerals (DECUBI-VITE PO) Take 1 tablet by mouth daily.   Yes Historical Provider, MD  ondansetron (ZOFRAN) 4 MG tablet Take 1 tablet (4 mg total) by mouth every 6 (six) hours as needed for nausea. 09/24/15  Yes Evlyn KannerPamela S Love, PA-C  pantoprazole (PROTONIX) 40 MG tablet Take 40 mg by mouth daily.   Yes Historical Provider, MD  potassium chloride (K-DUR,KLOR-CON) 10 MEQ tablet Take 30 mEq by mouth 2 (two) times daily.   Yes Historical Provider, MD  PRESCRIPTION MEDICATION Take 120 mLs by mouth 2 (two) times daily. Medpass   Yes Historical Provider, MD  tamsulosin (FLOMAX) 0.4 MG CAPS capsule Take 1 capsule (0.4 mg total) by mouth daily after supper. 09/24/15  Yes Evlyn KannerPamela S Love, PA-C  warfarin (COUMADIN) 5 MG tablet Take 1 tablet (5 mg total) by mouth daily at 6 PM. 09/24/15  Yes Jacquelynn CreePamela S Love, PA-C     Family History  Problem Relation Age of Onset  . Alzheimer's disease Father   . Rheum arthritis Mother     Social History   Social History  . Marital Status: Married    Spouse Name: N/A  . Number of Children: N/A  . Years of Education: N/A   Social History Main Topics  . Smoking status: Never Smoker   . Smokeless  tobacco: Never Used  . Alcohol Use: No  . Drug Use: No  . Sexual Activity: Not Currently   Other Topics Concern  . None   Social History Narrative     Review of Systems  Constitutional: Positive for fever, chills, activity change, appetite change and fatigue.  HENT: Negative.   Respiratory: Negative for cough and shortness of breath.   Cardiovascular: Negative for chest pain.  Gastrointestinal: Negative for nausea, vomiting and abdominal pain.  Genitourinary: Negative.   Musculoskeletal: Negative.   Skin: Negative.   Neurological: Negative.   Psychiatric/Behavioral: Negative.     Vital Signs: BP 91/50 mmHg  Pulse 95  Temp(Src) 97.9 F (36.6 C) (  Oral)  Resp 24  Ht  (1.753 m)  Wt 232 lb (105.235 kg)  BMI 34.24 kg/m2  SpO2 94%  Physical Exam  Constitutional: He is oriented to person, place, and time. He appears well-developed and well-nourished.  HENT:  Head: Normocephalic and atraumatic.  Eyes: EOM are normal.  Neck: Normal range of motion. Neck supple.  Cardiovascular: Normal rate, regular rhythm and normal heart sounds.   Pulmonary/Chest: Effort normal and breath sounds normal. No respiratory distress. He has no wheezes.  Abdominal: He exhibits no distension. There is no tenderness.  Ileostomy present.  Healed pink midline scar.  Musculoskeletal: Normal range of motion.  Neurological: He is alert and oriented to person, place, and time.  Skin: Skin is warm and dry.  Psychiatric: He has a normal mood and affect. His behavior is normal. Judgment and thought content normal.  Vitals reviewed.   Mallampati Score:  MD Evaluation Airway: WNL Heart: WNL Abdomen: WNL Chest/ Lungs: WNL Other Pertinent Findings: Hypotension secondary to sepsis ASA  Classification: 3 Mallampati/Airway Score: Two  Imaging: Ct Abdomen Pelvis W Contrast  10/16/2015  CLINICAL DATA:  Fever and abdominal pain.  Partial colectomy. EXAM: CT ABDOMEN AND PELVIS WITH CONTRAST  TECHNIQUE: Multidetector CT imaging of the abdomen and pelvis was performed using the standard protocol following bolus administration of intravenous contrast. CONTRAST:  ISOVUE-300 IOPAMIDOL (ISOVUE-300) INJECTION 61% COMPARISON:  09/18/2015 FINDINGS: Lower chest and abdominal wall: Continued decrease in left abdominal wall fluid collections, with no residual drainable collection. Percutaneous drains have been removed from the site. 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 A chronic and thick walled left pleural effusion continues to decrease in volume. Underlying left lower lobe scarring is again demonstrated and stable. Extensive atherosclerosis, including the coronary arteries. Hepatobiliary: No focal liver abnormality.No evidence of biliary obstruction or stone. Pancreas: Unremarkable. Spleen: Splenorenal shunt as previously seen. No acute portal venous obstruction. Adrenals/Urinary Tract: Negative adrenals. Bilateral small nonobstructing stones, up to 5 mm. No hydronephrosis or ureteral calculus. Chronic mild bladder wall thickening. No surrounding inflammatory change. Reproductive:Marked enlargement of the prostate, symmetric and stable. Stomach/Bowel: Status post colectomy with stable fluid filling of the Hartman's pouch. The wall of the colonic remnant remains thick, but there is no evidence of surrounding inflammation. There is a large gas and fluid collection with a well-defined rim enhancement in the central abdomen measuring 16 x 10 x 21 cm, consistent with abscess. This is likely in the interloop peritoneum. There are small daughter cystic areas which appear contiguous. There is also tracking into the anterior abdominal wall with potential loculated areas. Oral contrast reaches a right lower quadrant ileostomy and does not opacify the collection to suggest current bowel leak. No fluid collection seen in this area previously.  Vascular/Lymphatic: No acute vascular abnormality. Splenorenal shunt without current acute obstruction. Musculoskeletal: Advanced degenerative disc and facet disease disk with marked sclerosis and disc narrowing at L2-3 and L3-4. Chronic L4-5 anterolisthesis. These results were called by telephone at the time of interpretation on 10/16/2015 at 3:17 pm to [facility] Erskine Squibb, who verbally acknowledged these results. IMPRESSION: 1. 16 x 10 x 21 cm central abdominal abscess tracking into the ventral abdominal wall. Oral contrast reaches the patient's ileostomy and does not opacify the abscess to suggest bowel leak/fistula. 2. Left abdominal wall collections seen on comparison study 09/18/2015 have resolved. 3. Chronic complex left pleural effusion is small and decreased. 4. Stable appearance of sacral decubitus ulcer  extending to bone. 5. Splenorenal varix. Electronically Signed   By: Marnee Spring M.D.   On: 10/16/2015 15:30   Ct Abdomen Pelvis W Contrast  09/18/2015  CLINICAL DATA:  Follow-up abdominal abscess. Status post colectomy for ischemic colitis. EXAM: CT ABDOMEN AND PELVIS WITH CONTRAST TECHNIQUE: Multidetector CT imaging of the abdomen and pelvis was performed using the standard protocol following bolus administration of intravenous contrast. CONTRAST:  100 cc Omnipaque 300 IV. COMPARISON:  08/31/2015 CT abdomen/pelvis. FINDINGS: Lower chest: Lingular 4 mm pulmonary nodule (series 3/image 5), stable. Small left pleural, decreased. Passive segmental left lower lobe dependent atelectasis, slightly decreased. Right, left anterior descending and left circumflex coronary atherosclerotic calcifications. Hepatobiliary: Normal liver with no liver mass. Normal gallbladder with no radiopaque cholelithiasis. No biliary ductal dilatation. Pancreas: Normal, with no mass or duct dilation. Spleen: Normal size. No mass. Adrenals/Urinary Tract: Normal adrenals. Nonobstructing tiny right renal stones, largest 4 mm in the upper  right kidney. Nonobstructing 4 mm lower left renal stone. No hydronephrosis. Stable 4 mm stone in the dependent right bladder. Stomach/Bowel: Grossly normal stomach. Normal caliber small bowel with no small bowel wall thickening. Oral contrast traverses to the right lower quadrant end ileostomy. Status post total colectomy. The remnant is mildly distended by fluid with new nonspecific rectal wall thickening without appreciable rectal wall pneumatosis or new perirectal fat stranding. Vascular/Lymphatic: Atherosclerotic nonaneurysmal abdominal aorta. Patent portal, splenic, hepatic and renal veins. Stable patent prominent splenorenal shunt. No pathologically enlarged lymph nodes in the abdomen or pelvis. Reproductive: Stable moderate prostatomegaly. Other: Interval removal of the right percutaneous drainage catheter, with residual 2.6 x 1.5 cm anterior right lower quadrant fluid collection (series 8/image 30), decreased from 5.7 x 2.0 cm. Three percutaneous drainage catheter is in the lateral left abdominal wall are stable in position. There is a tiny 1.0 x 0.7 cm thick-walled fluid collection in the lateral left abdominal wall just inferior to the superior most drainage catheter (series 2/ image 51), decreased from 1.8 x 1.0 cm. There is persistent ill-defined fat stranding and enhancement throughout the lateral left abdominal wall with minimal scattered internal foci of gas. No new focal definable collection is noted. No free intraperitoneal gas. No ascites. Musculoskeletal: No aggressive appearing focal osseous lesions. Visualized lower sternotomy wires are intact. Severe degenerative changes in the lumbar spine. Stable small fat containing left inguinal hernia. IMPRESSION: 1. Interval removal of right percutaneous drainage catheter, with residual small anterior right lower quadrant fluid collection, decreased. 2. Tiny residual abscess in the lateral left abdominal muscle wall, decreased. Persistent inflammatory  changes in the lateral left abdominal muscle wall. No new fluid collections. 3. 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 4. New nonspecific wall thickening in the rectal remnant without pneumatosis, cannot exclude proctitis. 5. Numerous stable chronic findings as above. Electronically Signed   By: Delbert Phenix M.D.   On: 09/18/2015 19:07    Labs:  CBC:  Recent Labs  09/14/15 0510 09/21/15 0601 10/16/15 1718 10/17/15 0348  WBC 6.4 5.8 16.9* 11.6*  HGB 9.4* 8.9* 9.5* 8.7*  HCT 29.4* 29.4* 28.8* 27.1*  PLT 303 277 395 380    COAGS:  Recent Labs  07/21/15 0815  07/23/15 1200 07/25/15 0419 07/30/15 0627  09/21/15 0601 09/23/15 0420 09/25/15 0435 10/16/15 1718  INR 1.65*  < > 2.62* 1.48 1.35  < > 2.46* 2.52* 2.59* 2.45*  APTT 29  --  38* 28 31  --   --   --   --   --   < > =  values in this interval not displayed.  BMP:  Recent Labs  09/21/15 0601 09/25/15 0435 10/16/15 1718 10/17/15 0348  NA 135 138 130* 135  K 4.1 3.8 4.5 3.6  CL 103 103 100* 107  CO2 26 26 21* 20*  GLUCOSE 93 89 123* 108*  BUN 10 8 11 12   CALCIUM 8.3* 8.5* 8.6* 8.4*  CREATININE 0.39* 0.41* 0.66 0.40*  GFRNONAA >60 >60 >60 >60  GFRAA >60 >60 >60 >60    LIVER FUNCTION TESTS:  Recent Labs  07/24/15 0345 07/25/15 0419  08/02/15 0620 08/16/15 0701 08/28/15 0430 10/16/15 1718  BILITOT 0.7 0.9  --   --   --  0.6 0.5  AST 40 35  --   --   --  22 27  ALT 15* 14*  --   --   --  10* 29  ALKPHOS 167* 199*  --   --   --  80 150*  PROT 4.3* 4.5*  --   --   --  5.2* 5.8*  ALBUMIN <1.0* <1.0*  < > 1.2* 1.2* 1.6* 1.5*  < > = values in this interval not displayed.  TUMOR MARKERS: No results for input(s): AFPTM, CEA, CA199, CHROMGRNA in the last 8760 hours.  Assessment and Plan:  CABG on 05/22/15 by Dr. Donata Clay.   Total abdominal colectomy with ileostomy 06/18/2015   Multiple abdominal and pelvic abscesses. CT  guided drain placement by Dr. Bonnielee Haff 07/21/2015  The most recent CT scan on 09/18/15 showed that the fluid collections had resolved and the drains were removed.   He was sent from the [facility] due to fevers and chills for the last 2 weeks despite antibiotics for a UTI.  He has had low blood pressures for the past 2 days.   CT scan which revealed a large abscess 16 x 10 x 21 cm   Will proceed with CT guided drainage/drain placement today by Dr. Fredia Sorrow   Risks and Benefits discussed with the patient including bleeding, infection, damage to adjacent structures, bowel perforation/fistula connection, and sepsis.  All of the patient's questions were answered, patient is agreeable to proceed. Consent signed and in chart.   Thank you for this interesting consult.  I greatly enjoyed meeting WILKES POTVIN and look forward to participating in their care.  A copy of this report was sent to the requesting provider on this date.  Signed: Gwynneth Macleod PA-C 10/17/2015, 9:22 AM   I spent a total of 40 Minutes in face to face in clinical consultation, greater than 50% of which was counseling/coordinating care for CT guided abscess drain.

## 2015-10-17 NOTE — Progress Notes (Signed)
Patient ID: Jonathan Le, male   DOB: 11/24/1945, 69 y.o.   MRN: [id number]    Subjective: Drowsy but conversant. Denies abdominal pain.  Objective: Vital signs in last 24 hours: Temp:  [97.9 F (36.6 C)-102.6 F (39.2 C)] 98.4 F (36.9 C) (12/03 0657) Pulse Rate:  [88-108] 94 (12/03 0730) Resp:  [15-36] 20 (12/03 0730) BP: (76-119)/(36-82) 76/45 mmHg (12/03 0730) SpO2:  [89 %-100 %] 93 % (12/03 0730) Weight:  [105.235 kg (232 lb)] 105.235 kg (232 lb) (12/02 1704) Last BM Date: 10/17/15  Intake/Output from previous day: 12/02 0701 - 12/03 0700 In: 3538.5 [I.V.:2622.5; Blood:816; IV Piggyback:100] Out: 1675 [Urine:350; Emesis/NG output:225; Stool:500] Intake/Output this shift:    General appearance: fatigued, no distress and chronically ill-apearing Resp: no increased work of breathing GI: mildly distended. Fullness/mass mid abdomen. Healed midline incision. Ileostomy functioning.  Lab Results:   Recent Labs  10/16/15 1718 10/17/15 0348  WBC 16.9* 11.6*  HGB 9.5* 8.7*  HCT 28.8* 27.1*  PLT 395 380   BMET  Recent Labs  10/16/15 1718 10/17/15 0348  NA 130* 135  K 4.5 3.6  CL 100* 107  CO2 21* 20*  GLUCOSE 123* 108*  BUN 11 12  CREATININE 0.66 0.40*  CALCIUM 8.6* 8.4*   Lab Results  Component Value Date   INR 2.45* 10/16/2015   INR 2.59* 09/25/2015   INR 2.52* 09/23/2015     Studies/Results: Ct Abdomen Pelvis W Contrast  10/16/2015  CLINICAL DATA:  Fever and abdominal pain.  Partial colectomy. EXAM: CT ABDOMEN AND PELVIS WITH CONTRAST TECHNIQUE: Multidetector CT imaging of the abdomen and pelvis was performed using the standard protocol following bolus administration of intravenous contrast. CONTRAST:  100mL ISOVUE-300 IOPAMIDOL (ISOVUE-300) INJECTION 61% COMPARISON:  09/18/2015 FINDINGS: Lower chest and abdominal wall: Continued decrease in left abdominal wall fluid collections, with no residual drainable collection. Percutaneous drains have been removed  from the site. 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 A chronic and thick walled left pleural effusion continues to decrease in volume. Underlying left lower lobe scarring is again demonstrated and stable. Extensive atherosclerosis, including the coronary arteries. Hepatobiliary: No focal liver abnormality.No evidence of biliary obstruction or stone. Pancreas: Unremarkable. Spleen: Splenorenal shunt as previously seen. No acute portal venous obstruction. Adrenals/Urinary Tract: Negative adrenals. Bilateral small nonobstructing stones, up to 5 mm. No hydronephrosis or ureteral calculus. Chronic mild bladder wall thickening. No surrounding inflammatory change. Reproductive:Marked enlargement of the prostate, symmetric and stable. Stomach/Bowel: Status post colectomy with stable fluid filling of the Hartman's pouch. The wall of the colonic remnant remains thick, but there is no evidence of surrounding inflammation. There is a large gas and fluid collection with a well-defined rim enhancement in the central abdomen measuring 16 x 10 x 21 cm, consistent with abscess. This is likely in the interloop peritoneum. There are small daughter cystic areas which appear contiguous. There is also tracking into the anterior abdominal wall with potential loculated areas. Oral contrast reaches a right lower quadrant ileostomy and does not opacify the collection to suggest current bowel leak. No fluid collection seen in this area previously. Vascular/Lymphatic: No acute vascular abnormality. Splenorenal shunt without current acute obstruction. Musculoskeletal: Advanced degenerative disc and facet disease disk with marked sclerosis and disc narrowing at L2-3 and L3-4. Chronic L4-5 anterolisthesis. These results were called by telephone at the time of interpretation on 10/16/2015 at 3:17 pm to [facility] Erskine SquibbJane, who verbally acknowledged these results. IMPRESSION:  1. 16 x  10 x 21 cm central abdominal abscess tracking into the ventral abdominal wall. Oral contrast reaches the patient's ileostomy and does not opacify the abscess to suggest bowel leak/fistula. 2. Left abdominal wall collections seen on comparison study 09/18/2015 have resolved. 3. Chronic complex left pleural effusion is small and decreased. 4. Stable appearance of sacral decubitus ulcer extending to bone. 5. Splenorenal varix. Electronically Signed   By: Marnee Spring M.D.   On: 10/16/2015 15:30    Anti-infectives: Anti-infectives    Start     Dose/Rate Route Frequency Ordered Stop   10/17/15 1730  cefTRIAXone (ROCEPHIN) 1 g in dextrose 5 % 50 mL IVPB  Status:  Discontinued     1 g 100 mL/hr over 30 Minutes Intravenous Every 24 hours 10/16/15 2146 10/17/15 0008   10/17/15 1000  cefTRIAXone (ROCEPHIN) 2 g in dextrose 5 % 50 mL IVPB     2 g 100 mL/hr over 30 Minutes Intravenous Every 24 hours 10/17/15 0008     10/17/15 0800  vancomycin (VANCOCIN) IVPB 1000 mg/200 mL premix     1,000 mg 200 mL/hr over 60 Minutes Intravenous Every 12 hours 10/16/15 1932     10/17/15 0130  metroNIDAZOLE (FLAGYL) IVPB 500 mg     500 mg 100 mL/hr over 60 Minutes Intravenous Every 8 hours 10/16/15 2147     10/16/15 1730  metroNIDAZOLE (FLAGYL) IVPB 500 mg     500 mg 100 mL/hr over 60 Minutes Intravenous  Once 10/16/15 1721 10/16/15 1923   10/16/15 1730  cefTRIAXone (ROCEPHIN) 1 g in dextrose 5 % 50 mL IVPB     1 g 100 mL/hr over 30 Minutes Intravenous  Once 10/16/15 1721 10/16/15 1902   10/16/15 1730  vancomycin (VANCOCIN) 2,000 mg in sodium chloride 0.9 % 500 mL IVPB     2,000 mg 250 mL/hr over 120 Minutes Intravenous  Once 10/16/15 1727 10/16/15 2222      Assessment/Plan: 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 Previous multiple abdominal abscesses New large mid abdominal abscess. Plan percutaneous drainage after INR corrected. Appears stable to improved  on antibiotics currently.    LOS: 1 day    Mossie Gilder T 10/17/2015

## 2015-10-18 DIAGNOSIS — R6520 Severe sepsis without septic shock: Secondary | ICD-10-CM | POA: Insufficient documentation

## 2015-10-18 DIAGNOSIS — I959 Hypotension, unspecified: Secondary | ICD-10-CM

## 2015-10-18 DIAGNOSIS — A419 Sepsis, unspecified organism: Secondary | ICD-10-CM | POA: Insufficient documentation

## 2015-10-18 DIAGNOSIS — R0902 Hypoxemia: Secondary | ICD-10-CM | POA: Insufficient documentation

## 2015-10-18 LAB — PROTIME-INR
INR: 2.61 — ABNORMAL HIGH (ref 0.00–1.49)
PROTHROMBIN TIME: 27.6 s — AB (ref 11.6–15.2)

## 2015-10-18 LAB — PREPARE FRESH FROZEN PLASMA
UNIT DIVISION: 0
UNIT DIVISION: 0
UNIT DIVISION: 0
UNIT DIVISION: 0
Unit division: 0
Unit division: 0
Unit division: 0

## 2015-10-18 LAB — BASIC METABOLIC PANEL
ANION GAP: 6 (ref 5–15)
BUN: 9 mg/dL (ref 6–20)
CHLORIDE: 109 mmol/L (ref 101–111)
CO2: 21 mmol/L — AB (ref 22–32)
CREATININE: 0.41 mg/dL — AB (ref 0.61–1.24)
Calcium: 8.1 mg/dL — ABNORMAL LOW (ref 8.9–10.3)
GFR calc non Af Amer: >60 mL/min (ref >60)
Glucose, Bld: 93 mg/dL (ref 65–99)
POTASSIUM: 3.8 mmol/L (ref 3.5–5.1)
SODIUM: 136 mmol/L (ref 135–145)

## 2015-10-18 LAB — CBC
HEMATOCRIT: 24.5 % — AB (ref 39.0–52.0)
HEMOGLOBIN: 7.9 g/dL — AB (ref 13.0–17.0)
MCH: 27.1 pg (ref 26.0–34.0)
MCHC: 32.2 g/dL (ref 30.0–36.0)
MCV: 84.2 fL (ref 78.0–100.0)
Platelets: 332 10*3/uL (ref 150–400)
RBC: 2.91 MIL/uL — AB (ref 4.22–5.81)
RDW: 14.7 % (ref 11.5–15.5)
WBC: 7.2 10*3/uL (ref 4.0–10.5)

## 2015-10-18 LAB — LACTIC ACID, PLASMA
LACTIC ACID, VENOUS: 1.2 mmol/L (ref 0.5–2.0)
LACTIC ACID, VENOUS: 1 mmol/L (ref 0.5–2.0)

## 2015-10-18 LAB — URINE CULTURE

## 2015-10-18 LAB — CORTISOL: Cortisol, Plasma: 12.5 ug/dL

## 2015-10-18 MED ORDER — ALBUMIN HUMAN 5 % IV SOLN
12.5000 g | Freq: Four times a day (QID) | INTRAVENOUS | Status: AC
  Administered 2015-10-18 (×4): 12.5 g via INTRAVENOUS
  Filled 2015-10-18 (×4): qty 250

## 2015-10-18 MED ORDER — CEFEPIME HCL 2 G IJ SOLR
2.0000 g | Freq: Three times a day (TID) | INTRAMUSCULAR | Status: DC
  Administered 2015-10-18 – 2015-10-20 (×7): 2 g via INTRAVENOUS
  Filled 2015-10-18 (×8): qty 2

## 2015-10-18 MED ORDER — SODIUM CHLORIDE 0.9 % IV BOLUS (SEPSIS)
500.0000 mL | Freq: Once | INTRAVENOUS | Status: AC
  Administered 2015-10-18: 500 mL via INTRAVENOUS

## 2015-10-18 MED ORDER — HYDROCORTISONE NA SUCCINATE PF 100 MG IJ SOLR
50.0000 mg | Freq: Four times a day (QID) | INTRAMUSCULAR | Status: DC
  Administered 2015-10-18 – 2015-10-27 (×36): 50 mg via INTRAVENOUS
  Filled 2015-10-18 (×36): qty 2

## 2015-10-18 NOTE — Progress Notes (Signed)
Patient is refusing to wear CPAP.  Also RN received call from patient's wife. The NP at [facility] had left her a message saying that there was blood in the stool sample they collected before he was hospitalized. RN notified MD on call and will pass of information to day RN.

## 2015-10-18 NOTE — Progress Notes (Addendum)
[facility] PROGRESS NOTE  Jonathan Le KGM:[id number] DOB: 1946/06/25 DOA: 10/16/2015 PCP: Elizabeth Palau, FNP   Brief narrative 69 year old male with medical history of CAD status post CABG in July 2016, CHF with EF of 30-35 %, hypertension and history of DVT on Coumadin, obstructive sleep apnea on CPAP, glaucoma, development of ischemic colitis in August 2016 for which he underwent excoriated a laparotomy with total colectomy and ileostomy with prolonged intubation requiring tracheostomy, followed by a complicated course with multiple abdominal and pelvic abscesses that have been drained. His last CT scan from 09/18/2015 showed fluid collection to have resolved and drains were removed. Patient was then sent to [facility]. For the last 2 weeks he was having vague abdominal pain with occasional fever. He was found to have a UTI and was started on antibiotics. However for the past 2 days he was having low blood pressure. He was sent for outpatient CT scan of the abdomen which showed a large central abdominal abscess measuring 161021 cm and was sent to the ED. Patient was found to be in septic shock with fever of 102.22F, tachycardic, hypotensive (blood pressure 83/37 mmHg) and significant leukocytosis (16.9 K). Patient admitted to stepdown unit on empiric antibiotics. General surgery consulted . Patient had drainage of his abdominal abscess by IR on 12/3.   Assessment/Plan: Septic shock Secondary to intra-abdominal abscess. Patient hypotensive and given his cardiomyopathy with leg edema cannot use aggressive IV hydration. Surgery following. MAP in the 50s.Marland Kitchen Antihypertensives on hold.  PC CM consult appreciated. Receiving IV albumin to improve intravascular volume. -Continue empiric vancomycin, added cefepime for broader coverage.  CT drainage of the abdominal abscess by IR on 12/3.Marland Kitchen Has had over 2 L drain output. -Monitor CBC, strict I/O. Lactic acid normalized.  Follow cultures. -Continue step down monitoring.  CAD status post CABG with cardiomyopathy Patient had NSTEMI in July 2016 with cardiogenic shock . Three-vessel disease seen on cardiac cath with EF of 30-35% He underwent CABG. Currently has some volume overload. Lasix on hold. Getting gentle hydration for hypotension. Monitor strict I/O. consult cardiology if necessary.  Abdominal pain Secondary to abscess. Currently symptomatic. Continue when necessary pain meds  Stage IV sacral decubitus ulcer Appears unchanged. Wound care consulted.  Anemia of chronic disease Reportedly had some blood in his colostomy yesterday. Mild drop in hemoglobin but around baseline. Check INR today.  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 Warfarin on hold. Given 3 unit FFP for reversal.  Check INR  OSA Continue nighttime CPAP  Protein calorie malnutrition Consult dietitian.  Diet: Heart healthy DVT prophylaxis: Therapeutic INR  Code Status: Full code Family Communication: None at bedside. Spoke with sister yesterday. Disposition Plan: Continue step down monitoring.   Consultants:  CCS  PCCM  IR  Procedures:  CT drainage of abdominal abscess (schedules)  Antibiotics:  IV vancomycin,  Flagyl (12/2--)  Cefepime 12/3--  HPI/Subjective: Seen and examined. Denies pain around drain site. Blood pressure remains low. Had CT-guided drainage of his abdominal abscess yesterday.  Objective: Filed Vitals:   10/18/15 0700 10/18/15 0800  BP: 92/48   Pulse:    Temp:  99.4 F (37.4 C)  Resp: 25     Intake/Output Summary (Last 24 hours) at 10/18/15 1031 Last data filed at 10/18/15 0700  Gross per 24 hour  Intake   3320 ml  Output   4090 ml  Net   -770 ml   Filed Weights   10/16/15 1704  Weight: 105.235 kg (  232 lb)    Exam:   General: Elderly male not in distress, appears pale  HEENT: Pallor present, moist mucosa, supple neck, no JVD  Chest: Fine  basilar crackles, no rhonchi or wheeze  Cardiovascular: S1 and S2, no murmurs rub or gallop  GI: Soft, nondistended, nontender, abdominal drain in place with clean dressing, ileostomy bag in place with some clear stool, bowel sounds present  Musculoskeletal: Warm, 1+ pitting edema bilaterally, stage IV sacral decubitus  CNS: Alert and oriented  Data Reviewed: Basic Metabolic Panel:  Recent Labs Lab 10/16/15 1718 10/17/15 0348 10/18/15 0246  NA 130* 135 136  K 4.5 3.6 3.8  CL 100* 107 109  CO2 21* 20* 21*  GLUCOSE 123* 108* 93  BUN CREATININE 0.66 0.40* 0.41*  CALCIUM 8.6* 8.4* 8.1*   Liver Function Tests:  Recent Labs Lab 10/16/15 1718  AST 27  ALT 29  ALKPHOS 150*  BILITOT 0.5  PROT 5.8*  ALBUMIN 1.5*    Recent Labs Lab 10/16/15 1718  LIPASE 18   No results for input(s): AMMONIA in the last 168 hours. CBC:  Recent Labs Lab 10/16/15 1718 10/17/15 0348 10/18/15 0246  WBC 16.9* 11.6* 7.2  NEUTROABS 14.0*  --   --   HGB 9.5* 8.7* 7.9*  HCT 28.8* 27.1* 24.5*  MCV 84.0 85.5 84.2  PLT 395 380 332   Cardiac Enzymes: No results for input(s): CKTOTAL, CKMB, CKMBINDEX, TROPONINI in the last 168 hours. BNP (last 3 results)  Recent Labs  07/28/15 0500 08/02/15 0620 08/16/15 0701  BNP 453.8* 367.7* 528.3*    ProBNP (last 3 results) No results for input(s): PROBNP in the last 8760 hours.  CBG: No results for input(s): GLUCAP in the last 168 hours.  Recent Results (from the past 240 hour(s))  Blood Culture (routine x 2)     Status: None (Preliminary result)   Collection Time: 10/16/15  5:18 PM  Result Value Ref Range Status   Specimen Description BLOOD RIGHT ARM  Final   Special Requests BOTTLES DRAWN AEROBIC AND ANAEROBIC  Final   Culture NO GROWTH < 24 HOURS  Final   Report Status PENDING  Incomplete  Blood Culture (routine x 2)     Status: None (Preliminary result)   Collection Time: 10/16/15  5:53 PM  Result Value Ref Range  Status   Specimen Description BLOOD RIGHT HAND  Final   Special Requests BOTTLES DRAWN AEROBIC AND ANAEROBIC  Final   Culture NO GROWTH < 24 HOURS  Final   Report Status PENDING  Incomplete  Urine culture     Status: None (Preliminary result)   Collection Time: 10/16/15  6:40 PM  Result Value Ref Range Status   Specimen Description URINE, CLEAN CATCH  Final   Special Requests NONE  Final   Culture TOO YOUNG TO READ  Final   Report Status PENDING  Incomplete  Culture, routine-abscess     Status: None (Preliminary result)   Collection Time: 10/17/15  3:20 PM  Result Value Ref Range Status   Specimen Description PERITONEAL ABSCESS  Final   Special Requests NONE  Final   Gram Stain PENDING  Incomplete   Culture   Final    Culture reincubated for better growth Performed at [facility]    Report Status PENDING  Incomplete     Studies: Ct Abdomen Pelvis W Contrast  10/16/2015  CLINICAL DATA:  Fever and abdominal pain.  Partial colectomy. EXAM: CT  ABDOMEN AND PELVIS WITH CONTRAST TECHNIQUE: Multidetector CT imaging of the abdomen and pelvis was performed using the standard protocol following bolus administration of intravenous contrast. CONTRAST:  100mL ISOVUE-300 IOPAMIDOL (ISOVUE-300) INJECTION 61% COMPARISON:  09/18/2015 FINDINGS: Lower chest and abdominal wall: Continued decrease in left abdominal wall fluid collections, with no residual drainable collection. Percutaneous drains have been removed from the site. 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 A chronic and thick walled left pleural effusion continues to decrease in volume. Underlying left lower lobe scarring is again demonstrated and stable. Extensive atherosclerosis, including the coronary arteries. Hepatobiliary: No focal liver abnormality.No evidence of biliary obstruction or stone. Pancreas: Unremarkable. Spleen: Splenorenal shunt as previously  seen. No acute portal venous obstruction. Adrenals/Urinary Tract: Negative adrenals. Bilateral small nonobstructing stones, up to 5 mm. No hydronephrosis or ureteral calculus. Chronic mild bladder wall thickening. No surrounding inflammatory change. Reproductive:Marked enlargement of the prostate, symmetric and stable. Stomach/Bowel: Status post colectomy with stable fluid filling of the Hartman's pouch. The wall of the colonic remnant remains thick, but there is no evidence of surrounding inflammation. There is a large gas and fluid collection with a well-defined rim enhancement in the central abdomen measuring 16 x 10 x 21 cm, consistent with abscess. This is likely in the interloop peritoneum. There are small daughter cystic areas which appear contiguous. There is also tracking into the anterior abdominal wall with potential loculated areas. Oral contrast reaches a right lower quadrant ileostomy and does not opacify the collection to suggest current bowel leak. No fluid collection seen in this area previously. Vascular/Lymphatic: No acute vascular abnormality. Splenorenal shunt without current acute obstruction. Musculoskeletal: Advanced degenerative disc and facet disease disk with marked sclerosis and disc narrowing at L2-3 and L3-4. Chronic L4-5 anterolisthesis. These results were called by telephone at the time of interpretation on 10/16/2015 at 3:17 pm to [facility] Erskine SquibbJane, who verbally acknowledged these results. IMPRESSION: 1. 16 x 10 x 21 cm central abdominal abscess tracking into the ventral abdominal wall. Oral contrast reaches the patient's ileostomy and does not opacify the abscess to suggest bowel leak/fistula. 2. Left abdominal wall collections seen on comparison study 09/18/2015 have resolved. 3. Chronic complex left pleural effusion is small and decreased. 4. Stable appearance of sacral decubitus ulcer extending to bone. 5. Splenorenal varix. Electronically Signed   By: Marnee SpringJonathon  Watts M.D.   On: 10/16/2015  15:30   Ct Image Guided Drainage By Percutaneous Catheter  10/18/2015  CLINICAL DATA:  History of colectomy, ileostomy and previous drainage of multiple peritoneal abscesses. Development of large dominant intraperitoneal abscess requiring drainage. EXAM: CT GUIDED DRAINAGE OF PERITONEAL ABSCESS COMPARISON:  CT of the abdomen and pelvis on 10/16/2015 ANESTHESIA/SEDATION: 1.0 Mg IV Versed 50 mcg IV Fentanyl Total Moderate Sedation Time:  32 minutes. PROCEDURE: The procedure, risks, benefits, and alternatives were explained to the patient. Questions regarding the procedure were encouraged and answered. The patient understands and consents to the procedure. A time-out was performed prior to initiating the procedure. The anterior abdominal wall was prepped with Betadine in a sterile fashion, and a sterile drape was applied covering the operative field. A sterile gown and sterile gloves were used for the procedure. Local anesthesia was provided with 1% Lidocaine. CT was performed of the abdomen and upper pelvis in a supine position. After choosing a site along the left anterior abdominal wall for drainage, an 18 gauge trocar needle was advanced into a peritoneal abscess collection. Aspiration of  fluid was performed and a fluid sample sent for culture analysis. A guidewire was advanced into the collection. After tract dilatation, a 12 French percutaneous drain was placed. Drainage catheter position was confirmed by CT. The catheter was flushed and connected to a suction bulb. It was secured at the skin with a Prolene retention suture and StatLock device. COMPLICATIONS: None FINDINGS: Large peritoneal abscess was localized. Puncture of the collection was performed just to the left of midline. Aspiration yielded grossly purulent fluid. After drainage catheter placement, there is copious return of purulent fluid. IMPRESSION: CT-guided percutaneous catheter drainage of large intraperitoneal abscess. A 12 French drain was  placed and attached to suction bulb drainage. A purulent fluid sample was sent for culture analysis. Electronically Signed   By: Irish Lack M.D.   On: 10/18/2015 09:13    Scheduled Meds: . albumin human  12.5 g Intravenous Q6H  . antiseptic oral rinse  15 mL Mouth Rinse TID PC  . ceFEPime (MAXIPIME) IV  2 g Intravenous 3 times per day  . feeding supplement (PRO-STAT SUGAR FREE 64)  30 mL Oral TID  . magnesium oxide  800 mg Oral BID  . metronidazole  500 mg Intravenous Q8H  . multivitamin with minerals  1 tablet Oral Daily  . pantoprazole  40 mg Oral Daily  . potassium chloride  30 mEq Oral BID  . vancomycin  1,000 mg Intravenous Q12H   Continuous Infusions: . sodium chloride    . sodium chloride 75 mL/hr (10/17/15 1156)      Time spent: 35 minutes    Sravya Grissom  [facility] Pager [phone]. If 7PM-7AM, please contact night-coverage at [email or web address], password [facility] 10/18/2015, 10:31 AM  LOS: 2 days

## 2015-10-18 NOTE — Progress Notes (Signed)
Utilization review completed.  

## 2015-10-18 NOTE — Progress Notes (Signed)
eLink Physician-Brief Progress Note Patient Name: Jonathan HarpsHarold D Carias DOB: 04/06/1946 MRN: [id number]   Date of Service  10/18/2015  HPI/Events of Note  Patient hypotensive with MAP ~50. Drain placed for intra-abdominal abscess by IR earlier today.   eICU Interventions  Will start Albumin q6 and if continues to be hypotensive, will add vasopressors. On rocephin- will broaden coverage.     Intervention Category Major Interventions: Hypotension - evaluation and management  Dorothyann Gibbsrag Tangia Pinard 10/18/2015, 2:29 AM

## 2015-10-18 NOTE — Progress Notes (Signed)
Referring Physician(s): Dr. Corliss Skains  Chief Complaint:  Abdominal abscess  Subjective:  Jonathan Le is doing much better this morning. He is not having any pain.  He is only c/o being hungry. He tells me "I think they emptied that drain 50 times during the night!"  Allergies: Unasyn; Levaquin; Penicillins; and Doxycycline  Medications: Prior to Admission medications   Medication Sig Start Date End Date Taking? Authorizing Provider  acetaminophen (TYLENOL) 325 MG tablet Take 2 tablets (650 mg total) by mouth 2 (two) times daily with breakfast and lunch. 09/24/15  Yes Evlyn Kanner Love, PA-C  Amino Acids-Protein Hydrolys (FEEDING SUPPLEMENT, PRO-STAT SUGAR FREE 64,) LIQD Take 30 mLs by mouth 3 (three) times daily. 09/24/15  Yes Evlyn Kanner Love, PA-C  antiseptic oral rinse (BIOTENE) LIQD 15 mLs by Mouth Rinse route 3 (three) times daily after meals. 09/24/15  Yes Evlyn Kanner Love, PA-C  diclofenac sodium (VOLTAREN) 1 % GEL Apply 2 g topically 4 (four) times daily. 09/24/15  Yes Evlyn Kanner Love, PA-C  famotidine (PEPCID) 20 MG tablet Take 1 tablet (20 mg total) by mouth 2 (two) times daily. 09/24/15  Yes Evlyn Kanner Love, PA-C  finasteride (PROSCAR) 5 MG tablet Take 1 tablet (5 mg total) by mouth daily. 09/24/15  Yes Evlyn Kanner Love, PA-C  furosemide (LASIX) 40 MG tablet Take 1 tablet (40 mg total) by mouth daily. 09/24/15  Yes Evlyn Kanner Love, PA-C  HYDROcodone-acetaminophen (NORCO/VICODIN) 5-325 MG tablet Take 1 tablet by mouth every 6 (six) hours as needed for moderate pain.   Yes Historical Provider, MD  magnesium oxide (MAG-OX) 400 (241.3 MG) MG tablet Take 2 tablets (800 mg total) by mouth 2 (two) times daily. 09/24/15  Yes Evlyn Kanner Love, PA-C  Menthol-Methyl Salicylate (MUSCLE RUB) 10-15 % CREA Apply 1 application topically 3 (three) times daily before meals. 09/24/15  Yes Evlyn Kanner Love, PA-C  Multiple Vitamins-Minerals (DECUBI-VITE PO) Take 1 tablet by mouth daily.   Yes Historical Provider, MD    ondansetron (ZOFRAN) 4 MG tablet Take 1 tablet (4 mg total) by mouth every 6 (six) hours as needed for nausea. 09/24/15  Yes Evlyn Kanner Love, PA-C  pantoprazole (PROTONIX) 40 MG tablet Take 40 mg by mouth daily.   Yes Historical Provider, MD  potassium chloride (K-DUR,KLOR-CON) 10 MEQ tablet Take 30 mEq by mouth 2 (two) times daily.   Yes Historical Provider, MD  PRESCRIPTION MEDICATION Take 120 mLs by mouth 2 (two) times daily. Medpass   Yes Historical Provider, MD  tamsulosin (FLOMAX) 0.4 MG CAPS capsule Take 1 capsule (0.4 mg total) by mouth daily after supper. 09/24/15  Yes Evlyn Kanner Love, PA-C  warfarin (COUMADIN) 5 MG tablet Take 1 tablet (5 mg total) by mouth daily at 6 PM. 09/24/15  Yes Evlyn Kanner Love, PA-C     Vital Signs: BP 92/48 mmHg  Pulse 100  Temp(Src) 98.1 F (36.7 C) (Oral)  Resp 25  Ht  (1.753 m)  Wt 232 lb (105.235 kg)  BMI 34.24 kg/m2  SpO2 92%  Physical Exam Awake and Alert Lungs Clear Heart RRR Abdomen NTND Drain in place, site looks good, flushes easily Output 2415 mls of milky tan colored drainage  Imaging: Ct Abdomen Pelvis W Contrast  10/16/2015  CLINICAL DATA:  Fever and abdominal pain.  Partial colectomy. EXAM: CT ABDOMEN AND PELVIS WITH CONTRAST TECHNIQUE: Multidetector CT imaging of the abdomen and pelvis was performed using the standard protocol following bolus administration of intravenous contrast. CONTRAST:  100mL ISOVUE-300 IOPAMIDOL (ISOVUE-300) INJECTION 61% COMPARISON:  09/18/2015 FINDINGS: Lower chest and abdominal wall: Continued decrease in left abdominal wall fluid collections, with no residual drainable collection. Percutaneous drains have been removed from the site. 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 A chronic and thick walled left pleural effusion continues to decrease in volume. Underlying left lower lobe scarring is again demonstrated and stable. Extensive  atherosclerosis, including the coronary arteries. Hepatobiliary: No focal liver abnormality.No evidence of biliary obstruction or stone. Pancreas: Unremarkable. Spleen: Splenorenal shunt as previously seen. No acute portal venous obstruction. Adrenals/Urinary Tract: Negative adrenals. Bilateral small nonobstructing stones, up to 5 mm. No hydronephrosis or ureteral calculus. Chronic mild bladder wall thickening. No surrounding inflammatory change. Reproductive:Marked enlargement of the prostate, symmetric and stable. Stomach/Bowel: Status post colectomy with stable fluid filling of the Hartman's pouch. The wall of the colonic remnant remains thick, but there is no evidence of surrounding inflammation. There is a large gas and fluid collection with a well-defined rim enhancement in the central abdomen measuring 16 x 10 x 21 cm, consistent with abscess. This is likely in the interloop peritoneum. There are small daughter cystic areas which appear contiguous. There is also tracking into the anterior abdominal wall with potential loculated areas. Oral contrast reaches a right lower quadrant ileostomy and does not opacify the collection to suggest current bowel leak. No fluid collection seen in this area previously. Vascular/Lymphatic: No acute vascular abnormality. Splenorenal shunt without current acute obstruction. Musculoskeletal: Advanced degenerative disc and facet disease disk with marked sclerosis and disc narrowing at L2-3 and L3-4. Chronic L4-5 anterolisthesis. These results were called by telephone at the time of interpretation on 10/16/2015 at 3:17 pm to [facility] Erskine SquibbJane, who verbally acknowledged these results. IMPRESSION: 1. 16 x 10 x 21 cm central abdominal abscess tracking into the ventral abdominal wall. Oral contrast reaches the patient's ileostomy and does not opacify the abscess to suggest bowel leak/fistula. 2. Left abdominal wall collections seen on comparison study 09/18/2015 have resolved. 3. Chronic  complex left pleural effusion is small and decreased. 4. Stable appearance of sacral decubitus ulcer extending to bone. 5. Splenorenal varix. Electronically Signed   By: Marnee SpringJonathon  Watts M.D.   On: 10/16/2015 15:30    Labs:  CBC:  Recent Labs  09/21/15 0601 10/16/15 1718 10/17/15 0348 10/18/15 0246  WBC 5.8 16.9* 11.6* 7.2  HGB 8.9* 9.5* 8.7* 7.9*  HCT 29.4* 28.8* 27.1* 24.5*  PLT 277 395 380 332    COAGS:  Recent Labs  07/21/15 0815  07/23/15 1200 07/25/15 0419 07/30/15 0627  09/23/15 0420 09/25/15 0435 10/16/15 1718 10/17/15 0847  INR 1.65*  < > 2.62* 1.48 1.35  < > 2.52* 2.59* 2.45* 2.19*  APTT 29  --  38* 28 31  --   --   --   --   --   < > = values in this interval not displayed.  BMP:  Recent Labs  09/25/15 0435 10/16/15 1718 10/17/15 0348 10/18/15 0246  NA 138 130* 135 136  K 3.8 4.5 3.6 3.8  CL 103 100* 107 109  CO2 26 21* 20* 21*  GLUCOSE 89 123* 108* 93  BUN 8 11 12 9   CALCIUM 8.5* 8.6* 8.4* 8.1*  CREATININE 0.41* 0.66 0.40* 0.41*  GFRNONAA >60 >60 >60 >60  GFRAA >60 >60 >60 >60    LIVER FUNCTION TESTS:  Recent Labs  07/24/15 0345 07/25/15 0419  08/02/15 0620 08/16/15 0701  08/28/15 0430 10/16/15 1718  BILITOT 0.7 0.9  --   --   --  0.6 0.5  AST 40 35  --   --   --  22 27  ALT 15* 14*  --   --   --  10* 29  ALKPHOS 167* 199*  --   --   --  80 150*  PROT 4.3* 4.5*  --   --   --  5.2* 5.8*  ALBUMIN <1.0* <1.0*  < > 1.2* 1.2* 1.6* 1.5*  < > = values in this interval not displayed.  Assessment and Plan:  S/P CT guided drainage of abdominal abscess 10/17/2015 by Dr. Fredia Sorrow Continue routine drain care, record output, flushes. Will follow  Signed: Eriverto Byrnes S Aki Abalos 10/18/2015, 8:10 AM   I spent a total of 15 Minutes at the the patient's bedside AND on the patient's hospital floor or unit, greater than 50% of which was counseling/coordinating care for f/u after CT guided drain.

## 2015-10-18 NOTE — Progress Notes (Signed)
RN called E-link about copious, extremely foul smelling gray drainage from JP drain. E-link MD said to watch it for now. No further orders; will continue to monitor.

## 2015-10-18 NOTE — Progress Notes (Signed)
ANTIBIOTIC CONSULT NOTE - INITIAL  Pharmacy Consult for Cefepime Indication: Intra-abdominal infection  Allergies  Allergen Reactions  . Unasyn [Ampicillin-Sulbactam Sodium] Hives, Itching and Rash    Diffuse rash not responding to Pepcid and Benadryl.  TDD.  Marland Kitchen. Levaquin [Levofloxacin] Nausea Only  . Penicillins Hives  . Doxycycline Rash    Patient Measurements: Height: 5\' 9"  (175.3 cm) Weight: 232 lb (105.235 kg) IBW/kg (Calculated) : 70.7   Vital Signs: Temp: 100.5 F (38.1 C) (12/04 0000) Temp Source: Axillary (12/04 0000) BP: 83/51 mmHg (12/04 0230) Intake/Output from previous day: 12/03 0701 - 12/04 0700 In: 2735 [P.O.:100; I.V.:1425; IV Piggyback:1200] Out: 3415 [Urine:475; Drains:2015; Stool:925] Intake/Output from this shift: Total I/O In: 1335 [I.V.:525; Other:10; IV Piggyback:800] Out: 2165 [Urine:300; Drains:1515; Stool:350]  Labs:  Recent Labs  10/16/15 1718 10/17/15 0348 10/18/15 0246  WBC 16.9* 11.6* 7.2  HGB 9.5* 8.7* 7.9*  PLT 395 380 332  CREATININE 0.66 0.40* 0.41*   Estimated Creatinine Clearance: 102.7 mL/min (by C-G formula based on Cr of 0.41). No results for input(s): VANCOTROUGH, VANCOPEAK, VANCORANDOM, GENTTROUGH, GENTPEAK, GENTRANDOM, TOBRATROUGH, TOBRAPEAK, TOBRARND, AMIKACINPEAK, AMIKACINTROU, AMIKACIN in the last 72 hours.   Microbiology: Recent Results (from the past 720 hour(s))  Blood Culture (routine x 2)     Status: None (Preliminary result)   Collection Time: 10/16/15  5:18 PM  Result Value Ref Range Status   Specimen Description BLOOD RIGHT ARM  Final   Special Requests BOTTLES DRAWN AEROBIC AND ANAEROBIC 5ML  Final   Culture NO GROWTH < 24 HOURS  Final   Report Status PENDING  Incomplete  Blood Culture (routine x 2)     Status: None (Preliminary result)   Collection Time: 10/16/15  5:53 PM  Result Value Ref Range Status   Specimen Description BLOOD RIGHT HAND  Final   Special Requests BOTTLES DRAWN AEROBIC AND ANAEROBIC  5ML  Final   Culture NO GROWTH < 24 HOURS  Final   Report Status PENDING  Incomplete  Urine culture     Status: None (Preliminary result)   Collection Time: 10/16/15  6:40 PM  Result Value Ref Range Status   Specimen Description URINE, CLEAN CATCH  Final   Special Requests NONE  Final   Culture TOO YOUNG TO READ  Final   Report Status PENDING  Incomplete    Medical History: Past Medical History  Diagnosis Date  . Anxiety   . Hypertension   . Arthritis   . Sleep apnea     wears CPAP nightly  . Glaucoma   . CHF (congestive heart failure) (HCC)     MI, with open heart surgery x 30 sdays ago per pt. (06/26/15)  . S/P CABG (coronary artery bypass graft)   . MI (myocardial infarction) (HCC)      Assessment: [age] yoM with abdominal abscess.  Started on Rocephin and Flagyl 12/3 now with hypotension.  MD broadening coverage with Cefepime.    Goal of Therapy:  Treat IAI  Plan:   Cefepime 2gm IV q8h   F/u SCr/cultures as needed  Susanne GreenhouseGreen, Evadene Wardrip R 10/18/2015,4:03 AM

## 2015-10-18 NOTE — Progress Notes (Signed)
Patient ID: Jonathan Le, male   DOB: 1945/11/25, 69 y.o.   MRN: [id number]    Subjective: Resting comfortably without complaints. Denies abdominal pain. He is hungry. Had successful percutaneous drainage with reportedly a large amount of foul-smelling purulent material obtained.  Objective: Vital signs in last 24 hours: Temp:  [97.9 F (36.6 C)-101.4 F (38.6 C)] 98.1 F (36.7 C) (12/04 0500) Pulse Rate:  [90-105] 100 (12/03 1516) Resp:  [17-60] 25 (12/04 0700) BP: (63-107)/(34-61) 92/48 mmHg (12/04 0700) SpO2:  [86 %-99 %] 92 % (12/04 0700) Last BM Date: 10/17/15  Intake/Output from previous day: 12/03 0701 - 12/04 0700 In: 3845 [P.O.:100; I.V.:1725; IV Piggyback:2000] Out: 4140 [Urine:525; Drains:2415; Stool:1200] Intake/Output this shift:    General appearance: alert, cooperative, no distress and chronically ill-appearing GI: normal findings: soft, non-tender and mid abdominal mass/fullness seems resolved. Drain in mid abdomen with purulent drainage to JP bulb  Lab Results:   Recent Labs  10/17/15 0348 10/18/15 0246  WBC 11.6* 7.2  HGB 8.7* 7.9*  HCT 27.1* 24.5*  PLT 380 332   BMET  Recent Labs  10/17/15 0348 10/18/15 0246  NA 135 136  K 3.6 3.8  CL 107 109  CO2 20* 21*  GLUCOSE 108* 93  BUN 12 9  CREATININE 0.40* 0.41*  CALCIUM 8.4* 8.1*     Studies/Results: Ct Abdomen Pelvis W Contrast  10/16/2015  CLINICAL DATA:  Fever and abdominal pain.  Partial colectomy. EXAM: CT ABDOMEN AND PELVIS WITH CONTRAST TECHNIQUE: Multidetector CT imaging of the abdomen and pelvis was performed using the standard protocol following bolus administration of intravenous contrast. CONTRAST:  ISOVUE-300 IOPAMIDOL (ISOVUE-300) INJECTION 61% COMPARISON:  09/18/2015 FINDINGS: Lower chest and abdominal wall: Continued decrease in left abdominal wall fluid collections, with no residual drainable collection. Percutaneous drains have been removed from the site. Chronic sacral  decubitus ulcer extending to the coccyx, without fluid collection or progressive bone destruction. A chronic and thick walled left pleural effusion continues to decrease in volume. Underlying left lower lobe scarring is again demonstrated and stable. Extensive atherosclerosis, including the coronary arteries. Hepatobiliary: No focal liver abnormality.No evidence of biliary obstruction or stone. Pancreas: Unremarkable. Spleen: Splenorenal shunt as previously seen. No acute portal venous obstruction. Adrenals/Urinary Tract: Negative adrenals. Bilateral small nonobstructing stones, up to 5 mm. No hydronephrosis or ureteral calculus. Chronic mild bladder wall thickening. No surrounding inflammatory change. Reproductive:Marked enlargement of the prostate, symmetric and stable. Stomach/Bowel: Status post colectomy with stable fluid filling of the Hartman's pouch. The wall of the colonic remnant remains thick, but there is no evidence of surrounding inflammation. There is a large gas and fluid collection with a well-defined rim enhancement in the central abdomen measuring 16 x 10 x 21 cm, consistent with abscess. This is likely in the interloop peritoneum. There are small daughter cystic areas which appear contiguous. There is also tracking into the anterior abdominal wall with potential loculated areas. Oral contrast reaches a right lower quadrant ileostomy and does not opacify the collection to suggest current bowel leak. No fluid collection seen in this area previously. Vascular/Lymphatic: No acute vascular abnormality. Splenorenal shunt without current acute obstruction. Musculoskeletal: Advanced degenerative disc and facet disease disk with marked sclerosis and disc narrowing at L2-3 and L3-4. Chronic L4-5 anterolisthesis. These results were called by telephone at the time of interpretation on 10/16/2015 at 3:17 pm to [facility] Erskine Squibb, who verbally acknowledged these results. IMPRESSION: 1. 16 x 10 x 21 cm central abdominal  abscess tracking into  the ventral abdominal wall. Oral contrast reaches the patient's ileostomy and does not opacify the abscess to suggest bowel leak/fistula. 2. Left abdominal wall collections seen on comparison study 09/18/2015 have resolved. 3. Chronic complex left pleural effusion is small and decreased. 4. Stable appearance of sacral decubitus ulcer extending to bone. 5. Splenorenal varix. Electronically Signed   By: Marnee SpringJonathon  Watts M.D.   On: 10/16/2015 15:30    Anti-infectives: Anti-infectives    Start     Dose/Rate Route Frequency Ordered Stop   10/18/15 0400  ceFEPIme (MAXIPIME) 2 g in dextrose 5 % 50 mL IVPB     2 g 100 mL/hr over 30 Minutes Intravenous 3 times per day 10/18/15 0314     10/17/15 1730  cefTRIAXone (ROCEPHIN) 1 g in dextrose 5 % 50 mL IVPB  Status:  Discontinued     1 g 100 mL/hr over 30 Minutes Intravenous Every 24 hours 10/16/15 2146 10/17/15 0008   10/17/15 1000  cefTRIAXone (ROCEPHIN) 2 g in dextrose 5 % 50 mL IVPB  Status:  Discontinued     2 g 100 mL/hr over 30 Minutes Intravenous Every 24 hours 10/17/15 0008 10/18/15 0236   10/17/15 0800  vancomycin (VANCOCIN) IVPB 1000 mg/200 mL premix     1,000 mg 200 mL/hr over 60 Minutes Intravenous Every 12 hours 10/16/15 1932     10/17/15 0130  metroNIDAZOLE (FLAGYL) IVPB 500 mg     500 mg 100 mL/hr over 60 Minutes Intravenous Every 8 hours 10/16/15 2147     10/16/15 1730  metroNIDAZOLE (FLAGYL) IVPB 500 mg     500 mg 100 mL/hr over 60 Minutes Intravenous  Once 10/16/15 1721 10/16/15 1923   10/16/15 1730  cefTRIAXone (ROCEPHIN) 1 g in dextrose 5 % 50 mL IVPB     1 g 100 mL/hr over 30 Minutes Intravenous  Once 10/16/15 1721 10/16/15 1902   10/16/15 1730  vancomycin (VANCOCIN) 2,000 mg in sodium chloride 0.9 % 500 mL IVPB     2,000 mg 250 mL/hr over 120 Minutes Intravenous  Once 10/16/15 1727 10/16/15 2222      Assessment/Plan: Complex abdominal history, 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 History of multiple abdominal abscesses without apparent fistulous connection. Status post successful percutaneous drainage of large abdominal abscess. Some hypotension last night not unexpected early after manipulation of his abscess. Continued drainage and antibiotics. Some drop in hemoglobin. Nursing facility reported positive blood test on his stool. Currently stool without blood. Suspect most of this is dilutional I will reorder diet    LOS: 2 days    Maximilian Tallo T 10/18/2015

## 2015-10-18 NOTE — Progress Notes (Signed)
PULMONARY / CRITICAL CARE MEDICINE   Name: KATRELL MILHORN MRN: [id number] DOB: 06-Apr-1946    ADMISSION DATE:  10/16/2015 CONSULTATION DATE:  10/17/2015  REFERRING MD:  [facility]-MD  CHIEF COMPLAINT:  Septic shock  HISTORY OF PRESENT ILLNESS:   69 year old male with multiple abdominal abscess and CHF with EF of 25% who came to the hospital for fevers and chills.  Found to have multiple abdominal abscess unknown etiology.  Going to IR for drainage today after being evaluated for surgery.  PCCM consulted for hypotension that did not respond to 750 ml of IVF.  Patient currently complain only of abdominal pain.  SUBJECTIVE:  Called back because SBP is in the 60s but patient is mentating well and following commands.  VITAL SIGNS: BP 101/49 mmHg  Pulse 100  Temp(Src) 99.4 F (37.4 C) (Oral)  Resp 22  Ht  (1.753 m)  Wt 105.235 kg (232 lb)  BMI 34.24 kg/m2  SpO2 93%  HEMODYNAMICS:    VENTILATOR SETTINGS:    INTAKE / OUTPUT: I/O last 3 completed shifts: In: 7189.5 [P.O.:100; I.V.:3572.5; MWNUU:7253; Other:20; IV Piggyback:2100] Out: 5815 [Urine:875; Emesis/NG output:225; Drains:2415; Other:600; Stool:1700]  PHYSICAL EXAMINATION: General:  Chronically ill appearing male, NAD Neuro:  Alert and interactive, moving all ext to command. HEENT:  [place]/AT, PERRL, EOM-I and MMM. Cardiovascular:  RRR, Nl S1/S2, -M/R/G. Lungs:  CTA bilaterally. Abdomen:  Soft, NT, ND and +BS. Musculoskeletal:  -edema and -tenderness. Skin:  Intact.  LABS:  BMET  Recent Labs Lab 10/16/15 1718 10/17/15 0348 10/18/15 0246  NA 130* 135 136  K 4.5 3.6 3.8  CL 100* 107 109  CO2 21* 20* 21*  BUN CREATININE 0.66 0.40* 0.41*  GLUCOSE 123* 108* 93    Electrolytes  Recent Labs Lab 10/16/15 1718 10/17/15 0348 10/18/15 0246  CALCIUM 8.6* 8.4* 8.1*    CBC  Recent Labs Lab 10/16/15 1718 10/17/15 0348 10/18/15 0246  WBC 16.9* 11.6* 7.2  HGB 9.5* 8.7* 7.9*  HCT 28.8* 27.1* 24.5*   PLT 395 380 332    Coag's  Recent Labs Lab 10/16/15 1718 10/17/15 0847  INR 2.45* 2.19*    Sepsis Markers  Recent Labs Lab 10/17/15 1115 10/18/15 0246 10/18/15 0531  LATICACIDVEN 1.3 1.2 1.0    ABG No results for input(s): PHART, PCO2ART, PO2ART in the last 168 hours.  Liver Enzymes  Recent Labs Lab 10/16/15 1718  AST 27  ALT 29  ALKPHOS 150*  BILITOT 0.5  ALBUMIN 1.5*    Cardiac Enzymes No results for input(s): TROPONINI, PROBNP in the last 168 hours.  Glucose No results for input(s): GLUCAP in the last 168 hours.  Imaging Ct Image Guided Drainage By Percutaneous Catheter  10/18/2015  CLINICAL DATA:  History of colectomy, ileostomy and previous drainage of multiple peritoneal abscesses. Development of large dominant intraperitoneal abscess requiring drainage. EXAM: CT GUIDED DRAINAGE OF PERITONEAL ABSCESS COMPARISON:  CT of the abdomen and pelvis on 10/16/2015 ANESTHESIA/SEDATION: 1.0 Mg IV Versed 50 mcg IV Fentanyl Total Moderate Sedation Time:  32 minutes. PROCEDURE: The procedure, risks, benefits, and alternatives were explained to the patient. Questions regarding the procedure were encouraged and answered. The patient understands and consents to the procedure. A time-out was performed prior to initiating the procedure. The anterior abdominal wall was prepped with Betadine in a sterile fashion, and a sterile drape was applied covering the operative field. A sterile gown and sterile gloves were used for the procedure. Local anesthesia was  provided with 1% Lidocaine. CT was performed of the abdomen and upper pelvis in a supine position. After choosing a site along the left anterior abdominal wall for drainage, an 18 gauge trocar needle was advanced into a peritoneal abscess collection. Aspiration of fluid was performed and a fluid sample sent for culture analysis. A guidewire was advanced into the collection. After tract dilatation, a 12 French percutaneous drain was  placed. Drainage catheter position was confirmed by CT. The catheter was flushed and connected to a suction bulb. It was secured at the skin with a Prolene retention suture and StatLock device. COMPLICATIONS: None FINDINGS: Large peritoneal abscess was localized. Puncture of the collection was performed just to the left of midline. Aspiration yielded grossly purulent fluid. After drainage catheter placement, there is copious return of purulent fluid. IMPRESSION: CT-guided percutaneous catheter drainage of large intraperitoneal abscess. A 12 French drain was placed and attached to suction bulb drainage. A purulent fluid sample was sent for culture analysis. Electronically Signed   By: Irish LackGlenn  Yamagata M.D.   On: 10/18/2015 09:13     STUDIES:  CT of the abdomen with multiple abscess.  CULTURES: Blood 12/2>>> Urine 12/2>>>  ANTIBIOTICS: Rocephin 12/2>>> Vanc 12/2>>> Flagyl 12/2>>>  SIGNIFICANT EVENTS: 12/3 to IR for a drain  LINES/TUBES: PIV  I reviewed the abdominal CT myself, abscess noted.  Discussed with [facility]-MD  DISCUSSION: 69 year old male with abdominal abscess going for a drain today, consult for hypotension in the 70s in a patient with CHF.  Patient is mentating well and making urine.  Repeat SBP is 93 systolic and patient feels well.  ASSESSMENT / PLAN:  - Gentle IV hydration. - Continue abx as ordered. - If worsens will need TLC and pressors. - Monitor for signs of fluid overload given CHF history. - F/U on culture. - Check PCT if worsens.   - What will be more telling than BP is UOP and mental status as BP changes dramatically with patient being asleep or awake as well as position.  PCCM will follow.  Alyson ReedyWesam G. Zell Doucette, M.D. [facility]. Pager: [phone]. After hours pager: [phone].  10/18/2015, 12:09 PM

## 2015-10-19 DIAGNOSIS — I9589 Other hypotension: Secondary | ICD-10-CM

## 2015-10-19 DIAGNOSIS — R34 Anuria and oliguria: Secondary | ICD-10-CM

## 2015-10-19 LAB — BASIC METABOLIC PANEL
Anion gap: 5 (ref 5–15)
BUN: 11 mg/dL (ref 6–20)
CALCIUM: 8.6 mg/dL — AB (ref 8.9–10.3)
CO2: 20 mmol/L — AB (ref 22–32)
CREATININE: 0.46 mg/dL — AB (ref 0.61–1.24)
Chloride: 113 mmol/L — ABNORMAL HIGH (ref 101–111)
GFR calc Af Amer: >60 mL/min (ref >60)
GFR calc non Af Amer: >60 mL/min (ref >60)
GLUCOSE: 171 mg/dL — AB (ref 65–99)
Potassium: 4.2 mmol/L (ref 3.5–5.1)
Sodium: 138 mmol/L (ref 135–145)

## 2015-10-19 LAB — PROTIME-INR
INR: 2.72 — ABNORMAL HIGH (ref 0.00–1.49)
Prothrombin Time: 28.4 seconds — ABNORMAL HIGH (ref 11.6–15.2)

## 2015-10-19 LAB — CBC
HEMATOCRIT: 25.8 % — AB (ref 39.0–52.0)
Hemoglobin: 8 g/dL — ABNORMAL LOW (ref 13.0–17.0)
MCH: 26.8 pg (ref 26.0–34.0)
MCHC: 31 g/dL (ref 30.0–36.0)
MCV: 86.3 fL (ref 78.0–100.0)
Platelets: 388 10*3/uL (ref 150–400)
RBC: 2.99 MIL/uL — ABNORMAL LOW (ref 4.22–5.81)
RDW: 14.9 % (ref 11.5–15.5)
WBC: 5.3 10*3/uL (ref 4.0–10.5)

## 2015-10-19 LAB — MRSA PCR SCREENING: MRSA by PCR: NEGATIVE

## 2015-10-19 MED ORDER — JUVEN PO PACK
1.0000 | PACK | Freq: Two times a day (BID) | ORAL | Status: DC
  Administered 2015-10-19 – 2015-10-29 (×17): 1 via ORAL
  Filled 2015-10-19 (×25): qty 1

## 2015-10-19 MED ORDER — LORAZEPAM 0.5 MG PO TABS
0.5000 mg | ORAL_TABLET | Freq: Once | ORAL | Status: AC
  Administered 2015-10-20: 0.5 mg via ORAL
  Filled 2015-10-19: qty 1

## 2015-10-19 MED ORDER — PRO-STAT SUGAR FREE PO LIQD
30.0000 mL | Freq: Every day | ORAL | Status: DC
  Filled 2015-10-19: qty 30

## 2015-10-19 NOTE — Progress Notes (Signed)
Initial Nutrition Assessment  DOCUMENTATION CODES:   Non-severe (moderate) malnutrition in context of chronic illness, Obesity unspecified  INTERVENTION:   -Continue Prostat liquid protein PO 30 ml daily, each supplement provides 100 kcal, 15 grams protein. -Provide Juven supplement BID, each packet provides 80 kcal and 14g protein -RD to continue to monitor  NUTRITION DIAGNOSIS:   Malnutrition related to chronic illness as evidenced by percent weight loss, moderate depletion of body fat, mild depletion of muscle mass.  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   Patient reports eating fairly well but states that he has had poor appetite d/t his medical issues and multiple hospitalizations since the summer. Pt has multiple wounds and has had to have an abdominal abscess drained.  Patient continues to lose weight, 73 lb since January 2016 (23% weight loss x 11 months, significant for time frame). Suspect exact weight amounts to be masked by fluid.  Patient states he is drinking his Prostat but he hates the taste. RD to have patient try Juven, as he might like the taste better. Will order BID and will decrease Prostat order to once daily.  Nutrition-Focused physical exam completed. Findings are mild-moderate fat depletion,  mild muscle depletion, and moderate edema.   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  Estimated Nutritional Needs:   Kcal:  2050-2250  Protein:  130-150g  Fluid:  2.1L/day  EDUCATION NEEDS:   No education needs identified at this time  Tilda FrancoLindsey Parnell Spieler, MS, RD, LDN Pager: [phone] After Hours Pager: [phone]

## 2015-10-19 NOTE — Progress Notes (Signed)
Pt has refused CPAP qhs.  Pt states that he has not been sleeping well while wearing our CPAP.  Pt states he wishes to try sleeping tonight without it.  Checked back again with Pt but still refuses.  Education provided when I first talked with the Pt at 2300 10/18/2015.  RT will continue to monitor as needed.

## 2015-10-19 NOTE — Evaluation (Signed)
Physical Therapy Evaluation Patient Details Name: Jonathan Le MRN: [id number] DOB: 05-Jun-1946 Today's Date: 10/19/2015   History of Present Illness  Jonathan Le is a 69 y.o. male with a history of CAD S/P CABG, CHF, HTN, Hx DVT n, OSA, and Glaucoma with a complex recent surgical history who was sent from the [facility] 10/16/15  due to fevers and chills for the last 2 weeks . CT scan which revealed a large abscess 16 x 10 x 21 cm and he was sent to the ED for admission and treatment. Prior history: After CABG in 7/16,Patient has been in LTACH, CIR, and lastly at [facility].. Has a sacral decubitus thet was treated in past with PLS and VAC attempts per patient. Has a colostomy and L drain from abdomen.  Clinical Impression  Patient is very pleasant, very deconditioned. Patient will benefit from PT to address problems listed in the note below to improve functional mobility.. Plans to return to SNF.     Follow Up Recommendations SNF;Supervision/Assistance - 24 hour    Equipment Recommendations  None recommended by PT    Recommendations for Other Services       Precautions / Restrictions Precautions Precautions: Fall Precaution Comments: sacral decub, drain lines from abdomen; knee pain, colostomy Required Braces or Orthoses:  (per last PT note, has a RLE brace for comfort.) Other Brace/Splint: hinged knee brace on the RLE for comfort per PT note      Mobility  Bed Mobility Overal bed mobility: Needs Assistance;+ 2 for safety/equipment Bed Mobility: Rolling;Sit to Sidelying Rolling: Mod assist   Supine to sit: Mod assist Sit to supine: Mod assist;HOB elevated Sit to sidelying: +2 for safety/equipment;+2 for physical assistance;Max assist General bed mobility comments: extra time to initiate roll, care taken to not press againt colostomy on  R, it popped open anway., RN  in to change. Assist fro trunk to upright(pt =40%). required assist for legs and trunk to  get back onto bed. Purple slides used to facilitate sliding up to [facility]. patient used rails to assist(25%)  Transfers                    Ambulation/Gait                Stairs            Wheelchair Mobility    Modified Rankin (Stroke Patients Only)       Balance Overall balance assessment: Needs assistance Sitting-balance support: Feet supported;Bilateral upper extremity supported Sitting balance-Leahy Scale: Fair                                       Pertinent Vitals/Pain Pain Assessment: Faces Faces Pain Scale: Hurts little more Pain Location: sacrum Pain Descriptors / Indicators: Discomfort Pain Intervention(s): Limited activity within patient's tolerance;Repositioned;Monitored during session    Home Living Family/patient expects to be discharged to:: Skilled nursing facility                      Prior Function Level of Independence: Needs assistance   Gait / Transfers Assistance Needed: patient reports : "a big guy comes in  and picks me up and puts me in the [facility]", cannot use sliding board due to sacral sore, per patient.           Hand Dominance  Extremity/Trunk Assessment   Upper Extremity Assessment: Generalized weakness           Lower Extremity Assessment: Generalized weakness      Cervical / Trunk Assessment: Kyphotic  Communication      Cognition Arousal/Alertness: Awake/alert Behavior During Therapy: WFL for tasks assessed/performed Overall Cognitive Status: Within Functional Limits for tasks assessed                      General Comments      Exercises        Assessment/Plan    PT Assessment Patient needs continued PT services  PT Diagnosis Difficulty walking;Generalized weakness;Acute pain   PT Problem List Decreased strength;Decreased activity tolerance;Decreased balance;Pain;Decreased mobility;Cardiopulmonary status limiting activity;Decreased knowledge of use of  DME;Decreased knowledge of precautions;Obesity;Decreased skin integrity  PT Treatment Interventions Functional mobility training;Therapeutic activities;Therapeutic exercise;Wheelchair mobility training;Patient/family education   PT Goals (Current goals can be found in the Care Plan section) Acute Rehab PT Goals 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 PT Goal Formulation: With patient Time For Goal Achievement: 11/02/15 Potential to Achieve Goals: Fair    Frequency Min 2X/week   Barriers to discharge        Co-evaluation               End of Session   Activity Tolerance: Patient tolerated treatment well Patient left: in bed;with call bell/phone within reach;with nursing/sitter in room Nurse Communication: Mobility status;Need for lift equipment         Time: 1155-1230 PT Time Calculation (min) (ACUTE ONLY): 35 min   Charges:   PT Evaluation $Initial PT Evaluation Tier I: 1 Procedure PT Treatments $Therapeutic Activity: 8-22 mins   PT G Codes:        Rada HayHill, Debe Anfinson Elizabeth 10/19/2015, 1:26 PM Blanchard KelchKaren Jun Rightmyer PT [phone]

## 2015-10-19 NOTE — Progress Notes (Signed)
Subjective: He looks pretty good, tolerating diet, he says he has lost weight thru this ordeal, but tolerating what he has.  Ostomy is draining well.   His decubitus is OK, very deep, with some ulcers around the opening.  He is packed wet to dry and it is pretty clean.  Drainage as noted below is purulent and white colored, the culture is still not finalized.  Objective: Vital signs in last 24 hours: Temp:  [96.2 F (35.7 C)-97.6 F (36.4 C)] 97.3 F (36.3 C) (12/05 0800) Resp:  [16-30] 30 (12/05 0700) BP: (82-127)/(49-84) 112/65 mmHg (12/05 0700) SpO2:  [90 %-99 %] 96 % (12/05 0700) Weight:  [109.8 kg (242 lb 1 oz)] 109.8 kg (242 lb 1 oz) (12/05 0400) Last BM Date: 10/17/15 1500 IV 500 from the drain and it is white purulent colored fluid.   Urine 500 recorded Diet:  Heart healthy 735 from ileostomy recorded. LABS THIS AM are stable Intake/Output from previous day: 12/04 0701 - 12/05 0700 In: 3060 [I.V.:1500; IV Piggyback:1550] Out: 1645 [Urine:500; Drains:410; Stool:735] Intake/Output this shift: Total I/O In: 331.3 [I.V.:331.3] Out: 45 [Drains:45]  General appearance: alert, cooperative and no distress Resp: clear to auscultation bilaterally and anterior GI: soft, non-tender; bowel sounds normal; no masses,  no organomegaly and IR drain shows a purulent appearing white fluid.  500 ml recorded yesterday Skin: He has a very deep, but clean sacral decubitus. he also has a skin level ulcer that hurts, and bleeds easily with movement. I would keep doing wet to dry but use orange dressings to protect skin.  Lab Results:   Recent Labs  10/18/15 0246 10/19/15 0350  WBC 7.2 5.3  HGB 7.9* 8.0*  HCT 24.5* 25.8*  PLT 332 388    BMET  Recent Labs  10/18/15 0246 10/19/15 0350  NA 136 138  K 3.8 4.2  CL 109 113*  CO2 21* 20*  GLUCOSE 93 171*  BUN 9 11  CREATININE 0.41* 0.46*  CALCIUM 8.1* 8.6*   PT/INR  Recent Labs  10/17/15 0847 10/18/15 1200  LABPROT 24.2*  27.6*  INR 2.19* 2.61*     Recent Labs Lab 10/16/15 1718  AST 27  ALT 29  ALKPHOS 150*  BILITOT 0.5  PROT 5.8*  ALBUMIN 1.5*     Lipase     Component Value Date/Time   LIPASE 18 10/16/2015 1718     Studies/Results: Ct Image Guided Drainage By Percutaneous Catheter  10/18/2015  CLINICAL DATA:  History of colectomy, ileostomy and previous drainage of multiple peritoneal abscesses. Development of large dominant intraperitoneal abscess requiring drainage. EXAM: CT GUIDED DRAINAGE OF PERITONEAL ABSCESS COMPARISON:  CT of the abdomen and pelvis on 10/16/2015 ANESTHESIA/SEDATION: 1.0 Mg IV Versed 50 mcg IV Fentanyl Total Moderate Sedation Time:  32 minutes. PROCEDURE: The procedure, risks, benefits, and alternatives were explained to the patient. Questions regarding the procedure were encouraged and answered. The patient understands and consents to the procedure. A time-out was performed prior to initiating the procedure. The anterior abdominal wall was prepped with Betadine in a sterile fashion, and a sterile drape was applied covering the operative field. A sterile gown and sterile gloves were used for the procedure. Local anesthesia was provided with 1% Lidocaine. CT was performed of the abdomen and upper pelvis in a supine position. After choosing a site along the left anterior abdominal wall for drainage, an 18 gauge trocar needle was advanced into a peritoneal abscess collection. Aspiration of fluid was performed and a fluid  sample sent for culture analysis. A guidewire was advanced into the collection. After tract dilatation, a 12 French percutaneous drain was placed. Drainage catheter position was confirmed by CT. The catheter was flushed and connected to a suction bulb. It was secured at the skin with a Prolene retention suture and StatLock device. COMPLICATIONS: None FINDINGS: Large peritoneal abscess was localized. Puncture of the collection was performed just to the left of midline.  Aspiration yielded grossly purulent fluid. After drainage catheter placement, there is copious return of purulent fluid. IMPRESSION: CT-guided percutaneous catheter drainage of large intraperitoneal abscess. A 12 French drain was placed and attached to suction bulb drainage. A purulent fluid sample was sent for culture analysis. Electronically Signed   By: Irish Lack M.D.   On: 10/18/2015 09:13    Medications: . antiseptic oral rinse  15 mL Mouth Rinse TID PC  . ceFEPime (MAXIPIME) IV  2 g Intravenous 3 times per day  . feeding supplement (PRO-STAT SUGAR FREE 64)  30 mL Oral TID  . hydrocortisone sodium succinate  50 mg Intravenous 4 times per day  . magnesium oxide  800 mg Oral BID  . metronidazole  500 mg Intravenous Q8H  . multivitamin with minerals  1 tablet Oral Daily  . pantoprazole  40 mg Oral Daily  . potassium chloride  30 mEq Oral BID  . vancomycin  1,000 mg Intravenous Q12H   . sodium chloride 75 mL/hr at 10/19/15 0726  . sodium chloride Stopped (10/19/15 0725)     Assessment/Plan Recurrent intraabdominal abscess; 16 x 10 x 21 cm. 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 Dwain Sarna, closure of abdominal wall, wound vac and ileostomy - 06/20/15, Dr. Donell Beers;  after CABG 05/22/15, Dr. Kathlee Nations Trigt. S/p hospitalized 10/13-11/11/16 for intraabdominal abscess drainage, possible stump breakdown. OSA with CPAP SACRAL DECUBITUS - currently clean and looks good Chronic respiratory failure - resolved   Chronic systolic CHF (EF 16%)  Hypertension DVT: on coumadin (INR 2.61 10/18/15) Atrial fibrillation Malnutrition Debility  Antibiotics:  Day 4 Vancomycin, Flagyl, day 4 cephalosporin, now on cefepime day 3.  I would expect to repeat the CT in a few days, but if he continues to put out this volume we may wait longer.    Plan:  I thought he had lost or had a change in his IR drain,  but it was an IV site.  I will be sure he gets labs in the AM.  Ask wound care to see.  He has some odor issues, we will get PT and OT involved also.  He is not on Coumadin right now, last INR was therapeutic yesterday.     LOS: 3 days    Ramona Slinger 10/19/2015

## 2015-10-19 NOTE — Progress Notes (Addendum)
At 06:00, IV site inspection showed evidence of full catheter dislodgement from original placement site, with the occlusive dressing completely peeled off. There was minimal oozing of blood from the catheter exit site, requiring application of pressure dressing over the site, which eventually controlled the bleeding. There was no evidence of local edema, erythema, skin blanching, tenderness or warmth around the insertion site. New IV site was restarted by this writer at 06:15, on the patient's left hand. Procedure was well-tolerated by patient.

## 2015-10-19 NOTE — Care Management Important Message (Signed)
Important Message  Patient Details  Name: Jonathan Le MRN: [id number] Date of Birth: 06/25/1946   Medicare Important Message Given:  Yes    Renie OraHawkins, Coda Filler Smith 10/19/2015, 1:36 PMImportant Message  Patient Details  Name: Jonathan Le MRN: [id number] Date of Birth: 09/22/1946   Medicare Important Message Given:  Yes    Renie OraHawkins, Mahki Spikes Smith 10/19/2015, 1:36 PM

## 2015-10-19 NOTE — Consult Note (Signed)
WOC wound consult note Reason for Consult: Sacral Stage 4 pressure injury, bilateral heels at risk. CCS is managing the abscess at the abdomen; JP drain is in place and draining purulent material with a strong odor. Wound type:pressure Pressure Ulcer POA: Yes Measurement: 7cm x 7cm x 6cm with undermining measuring 6cm at 12 o'clock and 3 o'clock.  Friable edge at 6 o'clock that is bleeding. Wound bed: red, moist with small amount of yellow tissue in base. Drainage (amount, consistency, odor) Moderate amount of serous exudate with serosanguinous exudate from peripheral edge mentioned earlier. Periwound:Intact, dry Dressing procedure/placement/frequency: I will continue the twice daily saline dressings and reinforce the education that patient must lie from side to side while in bed and avoid the supine position. Previous attempts to use NPWT were unsuccessful. Upon entry into the room today, [facility] 45-degree angle with arm resting on side rail; he is taught by both Dr. Abbey Chattersosenbower and this writer that no healing can occur while patient is positioned on the ulcer. I will also implement bilateral pressure redistribution heel boots for patient's use.    WOC ostomy consult note Stoma type/location: RLQ end ileostomy Stomal assessment/size: oval, 1 and 12/4 inch x 1 and 3/4 inch slightly raised, pale pink, moist functioning Peristomal assessment: intact, clear Treatment options for stomal/peristomal skin: skin barrier ring Output soft brown stool Ostomy pouching: 2pc. 2 and 3/4 inch pouching system with skin barrier ring. Education provided: Patient instructed to let staff know when pouch needs emptying (when 1/3 to 1/2 full of stool or air) so that system does not over-fill and require premature emptying. Enrolled patient in [employer] program: Yes, previously  WOC nursing team will not follow, but will remain available to this patient, the nursing, surgical and  medical teams.  Please re-consult if needed. Thanks, Ladona MowLaurie Ammi Hutt, MSN, RN, GNP, Hans EdenCWOCN, CWON-AP, FAAN  Pager# [phone]

## 2015-10-19 NOTE — Progress Notes (Signed)
ANTICOAGULATION CONSULT NOTE - Initial Consult  Pharmacy Consult for warfarin Indication: DVT  Allergies  Allergen Reactions  . Unasyn [Ampicillin-Sulbactam Sodium] Hives, Itching and Rash    Diffuse rash not responding to Pepcid and Benadryl.  TDD.  Marland Kitchen. Levaquin [Levofloxacin] Nausea Only  . Penicillins Hives  . Doxycycline Rash    Patient Measurements: Height: 5\' 9"  (175.3 cm) Weight: 242 lb 1 oz (109.8 kg) IBW/kg (Calculated) : 70.7  Vital Signs: Temp: 97.4 F (36.3 C) (12/05 1200) Temp Source: Oral (12/05 1200) BP: 112/65 mmHg (12/05 0700)  Labs:  Recent Labs  10/17/15 0348 10/17/15 0847 10/18/15 0246 10/18/15 1200 10/19/15 0350 10/19/15 1115  HGB 8.7*  --  7.9*  --  8.0*  --   HCT 27.1*  --  24.5*  --  25.8*  --   PLT 380  --  332  --  388  --   LABPROT  --  24.2*  --  27.6*  --  28.4*  INR  --  2.19*  --  2.61*  --  2.72*  CREATININE 0.40*  --  0.41*  --  0.46*  --     Estimated Creatinine Clearance: 104.9 mL/min (by C-G formula based on Cr of 0.46).   Medical History: Past Medical History  Diagnosis Date  . Anxiety   . Hypertension   . Arthritis   . Sleep apnea     wears CPAP nightly  . Glaucoma   . CHF (congestive heart failure) (HCC)     MI, with open heart surgery x 30 sdays ago per pt. (06/26/15)  . S/P CABG (coronary artery bypass graft)   . MI (myocardial infarction) (HCC)     Medications:  Scheduled:  . antiseptic oral rinse  15 mL Mouth Rinse TID PC  . ceFEPime (MAXIPIME) IV  2 g Intravenous 3 times per day  . feeding supplement (PRO-STAT SUGAR FREE 64)  30 mL Oral TID  . hydrocortisone sodium succinate  50 mg Intravenous 4 times per day  . magnesium oxide  800 mg Oral BID  . metronidazole  500 mg Intravenous Q8H  . multivitamin with minerals  1 tablet Oral Daily  . pantoprazole  40 mg Oral Daily  . potassium chloride  30 mEq Oral BID  . vancomycin  1,000 mg Intravenous Q12H   Infusions:    Assessment: Patient known to pharmacy  for dosing of antibiotics. Patient was on warfarin prior to admission at a dose of 5mg  q6pm for hx DVT was on hold at admission but now to restart warfarin per pharmacy dosing  Today, 10/19/2015  INR therapeutic despite no warfarin dose given since admission 12/2  Hgb low but stable, Plt stable  No reported bleeding  Drug interactions: Flagyl  Goal of Therapy:  INR 2-3   Plan:  1) Due to INR rising without warfarin being given and drug interaction with Flagyl, will give only 2.5mg  of warfarin 2) Daily INR   Hessie KnowsJustin M Auna Mikkelsen, PharmD, BCPS Pager [phone] 10/19/2015 12:44 PM

## 2015-10-19 NOTE — Plan of Care (Signed)
Problem: Safety: Goal: Ability to remain free from injury will improve Outcome: Progressing Falls prevention protocol maintained. Reviewed with patient and reinforced the importance of adherence to safety measures to reduce likelihood for falls or fall-related injuries, such as appropriate use of the call bell,  maintaining the bed in low and locked position, and keeping needed items within easy access. Patient verbalized understanding and demonstrated compliance.

## 2015-10-19 NOTE — Progress Notes (Addendum)
Jonathan Le, Jonathan DOA: 10/16/2015 PCP: Elizabeth Palau, Jonathan Le   Brief narrative 69 year old male with medical history of CAD status post CABG in July 2016, CHF with EF of 30-35 %, hypertension and history of DVT on Coumadin, obstructive sleep apnea on CPAP, glaucoma, development of ischemic colitis in August 2016 for which he underwent excoriated a laparotomy with total colectomy and ileostomy with prolonged intubation requiring tracheostomy, followed by a complicated course with multiple abdominal and pelvic abscesses that have been drained. His last CT scan from 09/18/2015 showed fluid collection to have resolved and drains were removed. Patient was then sent to [facility]. For the last 2 weeks he was having vague abdominal pain with occasional fever. He was found to have a UTI and was started on antibiotics. However for the past 2 days he was having low blood pressure. He was sent for outpatient CT scan of the abdomen which showed a large central abdominal abscess measuring 161021 cm and was sent to the ED. Patient was found to be in septic shock with fever of 102.68F, tachycardic, hypotensive (blood pressure 83/37 mmHg) and significant leukocytosis (16.9 K). Patient admitted to stepdown unit on empiric antibiotics. General surgery consulted . Patient had drainage of his abdominal abscess by IR on 12/3.   Assessment/Plan: Septic shock Secondary to intra-abdominal abscess. BP improving. (Received IV albumin to improve intravascular volume.). D/c fluids.  . Antihypertensives on hold.  PC CM consult appreciated.  -Continue empiric vancomycin, added cefepime for broader coverage. Follow abscess and blood cx. CT drainage of the abdominal abscess by IR on 12/3.Marland Kitchen Has had over 2.5 L output since palced ( 2L 1st 24 hrs then 500 cc past 24 hrs) -Monitor  strict I/O. Lactic acid normalized.  -will consult ID again ( was  following pt during last hospitalization 6 weeks back)  . Was treated with Unasyn for enterococcus on wound abscess followed by augmentin ( i think he was treated for at least a month).  CAD status post CABG with cardiomyopathy Patient had NSTEMI in July 2016 with cardiogenic shock . Three-vessel disease seen on cardiac cath with EF of 30-35% He underwent CABG. Currently has some volume overload. Lasix on hold. Getting gentle hydration for hypotension. Monitor strict I/O. consult cardiology if necessary.  Abdominal pain Secondary to abscess. Improved. Continue when necessary pain meds  Stage IV sacral decubitus ulcer Appears unchanged. Wound care consulted.  Anemia of chronic disease Reportedly had some blood in his colostomy yesterday. Mild drop in hemoglobin but around baseline. INR therapeutic.  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 Warfarin on hold. Given 3 unit FFP for reversal. INR still therapeutic. H&H stable. Will resume coumadin.  OSA Continue nighttime CPAP  Protein calorie malnutrition Consult dietitian.  Oliguria Possibly with hypovolemia and sepsis.renal fn normal. Monitor urine output.  Diet: regular ( per patient's insistance) DVT prophylaxis: Therapeutic INR  Code Status: Full code Family Communication: will update his wife. Disposition Plan: Continue step down monitoring. May transfer to tele  Later this evening or tomorrow..   Consultants:  CCS  PCCM  IR  Procedures:  CT drainage of abdominal abscess (schedules)  Antibiotics:  IV vancomycin,  Flagyl (12/2--)  Cefepime 12/3--  HPI/Subjective: Seen and examined. Some abdominla pain.   Objective: Filed Vitals:   10/19/15 0700 10/19/15 0800  BP: 112/65   Pulse:    Temp:  97.3 F (36.3 C)  Resp: 30  Intake/Output Summary (Last 24 hours) at 10/19/15 0901 Last data filed at 10/19/15 [id number]  Gross per 24 hour  Intake 2791.25 ml  Output   1720 ml  Net  1071.25 ml   Filed Weights   10/16/15 1704 10/19/15 0400  Weight: 105.235 kg (232 lb) 109.8 kg (242 lb 1 oz)    Exam:   General: Elderly male not in distress  HEENT: Pallor present, moist mucosa, supple neck  Chest: Fine basilar crackles, no rhonchi or wheeze  Cardiovascular: S1 and S2, 3/6 systolic murmur, no rubs or gallop  GI: Soft, nondistended, nontender, abdominal drain in place with clean dressing, ileostomy bag in place with some clear stool, bowel sounds present  Musculoskeletal: Warm, trace  pitting edema bilaterally, stage IV sacral decubitus  CNS: Alert and oriented  Data Reviewed: Basic Metabolic Panel:  Recent Labs Lab 10/16/15 1718 10/17/15 0348 10/18/15 0246 10/19/15 0350  NA 130* 135 136 138  K 4.5 3.6 3.8 4.2  CL 100* 107 109 113*  CO2 21* 20* 21* 20*  GLUCOSE 123* 108* 93 171*  BUN 11 12 9 11   CREATININE 0.66 0.40* 0.41* 0.46*  CALCIUM 8.6* 8.4* 8.1* 8.6*   Liver Function Tests:  Recent Labs Lab 10/16/15 1718  AST 27  ALT 29  ALKPHOS 150*  BILITOT 0.5  PROT 5.8*  ALBUMIN 1.5*    Recent Labs Lab 10/16/15 1718  LIPASE 18   No results for input(s): AMMONIA in the last 168 hours. CBC:  Recent Labs Lab 10/16/15 1718 10/17/15 0348 10/18/15 0246 10/19/15 0350  WBC 16.9* 11.6* 7.2 5.3  NEUTROABS 14.0*  --   --   --   HGB 9.5* 8.7* 7.9* 8.0*  HCT 28.8* 27.1* 24.5* 25.8*  MCV 84.0 85.5 84.2 86.3  PLT 395 380 332 388   Cardiac Enzymes: No results for input(s): CKTOTAL, CKMB, CKMBINDEX, TROPONINI in the last 168 hours. BNP (last 3 results)  Recent Labs  09/Le/16 0500 08/02/15 0620 08/16/15 0701  BNP 453.8* 367.7* 528.3*    ProBNP (last 3 results) No results for input(s): PROBNP in the last 8760 hours.  CBG: No results for input(s): GLUCAP in the last 168 hours.  Recent Results (from the past 240 hour(s))  Blood Culture (routine x 2)     Status: None (Preliminary result)   Collection Time: 10/16/15  5:18 PM  Result  Value Ref Range Status   Specimen Description BLOOD RIGHT ARM  Final   Special Requests BOTTLES DRAWN AEROBIC AND ANAEROBIC 5ML  Final   Culture NO GROWTH 2 DAYS  Final   Report Status PENDING  Incomplete  Blood Culture (routine x 2)     Status: None (Preliminary result)   Collection Time: 10/16/15  5:53 PM  Result Value Ref Range Status   Specimen Description BLOOD RIGHT HAND  Final   Special Requests BOTTLES DRAWN AEROBIC AND ANAEROBIC 5ML  Final   Culture NO GROWTH 2 DAYS  Final   Report Status PENDING  Incomplete  Urine culture     Status: None   Collection Time: 10/16/15  6:40 PM  Result Value Ref Range Status   Specimen Description URINE, CLEAN CATCH  Final   Special Requests NONE  Final   Culture MULTIPLE SPECIES PRESENT, SUGGEST RECOLLECTION  Final   Report Status 10/18/2015 FINAL  Final  Culture, routine-abscess     Status: None (Preliminary result)   Collection Time: 10/17/15  3:20 PM  Result Value Ref Range Status   Specimen Description  PERITONEAL ABSCESS  Final   Special Requests NONE  Final   Gram Stain   Final    ABUNDANT WBC PRESENT, PREDOMINANTLY PMN NO SQUAMOUS EPITHELIAL CELLS SEEN ABUNDANT GRAM POSITIVE COCCI IN PAIRS IN CHAINS MODERATE GRAM NEGATIVE RODS Performed at [facility]    Culture   Final    ABUNDANT GRAM NEGATIVE RODS Performed at [facility]    Report Status PENDING  Incomplete  Anaerobic culture     Status: None (Preliminary result)   Collection Time: 10/17/15  3:20 PM  Result Value Ref Range Status   Specimen Description PERITONEAL ABSCESS  Final   Special Requests NONE  Final   Gram Stain   Final    ABUNDANT WBC PRESENT, PREDOMINANTLY PMN NO SQUAMOUS EPITHELIAL CELLS SEEN ABUNDANT GRAM POSITIVE COCCI IN PAIRS IN CHAINS MODERATE GRAM NEGATIVE RODS Performed at [facility]    Culture PENDING  Incomplete   Report Status PENDING  Incomplete  MRSA PCR Screening     Status: None   Collection Time: 10/18/15  8:32  PM  Result Value Ref Range Status   MRSA by PCR NEGATIVE NEGATIVE Final    Comment:        The GeneXpert MRSA Assay (FDA approved for NASAL specimens only), is one component of a comprehensive MRSA colonization surveillance program. It is not intended to diagnose MRSA infection nor to guide or monitor treatment for MRSA infections.      Studies: Ct Image Guided Drainage By Percutaneous Catheter  10/18/2015  CLINICAL DATA:  History of colectomy, ileostomy and previous drainage of multiple peritoneal abscesses. Development of large dominant intraperitoneal abscess requiring drainage. EXAM: CT GUIDED DRAINAGE OF PERITONEAL ABSCESS COMPARISON:  CT of the abdomen and pelvis on 10/16/2015 ANESTHESIA/SEDATION: 1.0 Mg IV Versed 50 mcg IV Fentanyl Total Moderate Sedation Time:  32 minutes. PROCEDURE: The procedure, risks, benefits, and alternatives were explained to the patient. Questions regarding the procedure were encouraged and answered. The patient understands and consents to the procedure. A time-out was performed prior to initiating the procedure. The anterior abdominal wall was prepped with Betadine in a sterile fashion, and a sterile drape was applied covering the operative field. A sterile gown and sterile gloves were used for the procedure. Local anesthesia was provided with 1% Lidocaine. CT was performed of the abdomen and upper pelvis in a supine position. After choosing a site along the left anterior abdominal wall for drainage, an 18 gauge trocar needle was advanced into a peritoneal abscess collection. Aspiration of fluid was performed and a fluid sample sent for culture analysis. A guidewire was advanced into the collection. After tract dilatation, a 12 French percutaneous drain was placed. Drainage catheter position was confirmed by CT. The catheter was flushed and connected to a suction bulb. It was secured at the skin with a Prolene retention suture and StatLock device. COMPLICATIONS:  None FINDINGS: Large peritoneal abscess was localized. Puncture of the collection was performed just to the left of midline. Aspiration yielded grossly purulent fluid. After drainage catheter placement, there is copious return of purulent fluid. IMPRESSION: CT-guided percutaneous catheter drainage of large intraperitoneal abscess. A 12 French drain was placed and attached to suction bulb drainage. A purulent fluid sample was sent for culture analysis. Electronically Signed   By: Irish Lack M.D.   On: 10/18/2015 09:Le    Scheduled Meds: . antiseptic oral rinse  15 mL Mouth Rinse TID PC  . ceFEPime (MAXIPIME) IV  2 g Intravenous 3  times per day  . feeding supplement (PRO-STAT SUGAR FREE 64)  30 mL Oral TID  . hydrocortisone sodium succinate  50 mg Intravenous 4 times per day  . magnesium oxide  800 mg Oral BID  . metronidazole  500 mg Intravenous Q8H  . multivitamin with minerals  1 tablet Oral Daily  . pantoprazole  40 mg Oral Daily  . potassium chloride  30 mEq Oral BID  . vancomycin  1,000 mg Intravenous Q12H   Continuous Infusions: . sodium chloride 75 mL/hr at 10/19/15 0726  . sodium chloride Stopped (10/19/15 0725)      Time spent: 35 minutes    Konnar Ben  [facility] Pager [phone]. If 7PM-7AM, please contact night-coverage at [email or web address], password [facility] 10/19/2015, 9:01 AM  LOS: 3 days

## 2015-10-19 NOTE — Progress Notes (Signed)
PULMONARY / CRITICAL CARE MEDICINE   Name: Jonathan Le MRN: [id number] DOB: 01/30/1946    ADMISSION DATE:  10/16/2015 CONSULTATION DATE:  10/17/2015  REFERRING MD:  TRH-MD  CHIEF COMPLAINT:  Septic shock  HISTORY OF PRESENT ILLNESS:   69 year old male with multiple abdominal abscess and CHF with EF of 25% who came to the hospital for fevers and chills.  Found to have multiple abdominal abscess unknown etiology.   IR for drainage 12/4  after being evaluated for surgery.  PCCM consulted for hypotension that did not respond to 750 ml of IVF.  Patient currently complain only of abdominal pain.  SUBJECTIVE:  Afebrile Denies pain Breathing ok BP better Concern for drain misplaced   VITAL SIGNS: BP 112/65 mmHg  Pulse 100  Temp(Src) 97.3 F (36.3 C) (Oral)  Resp 30  Ht 5\' 9"  (1.753 m)  Wt 109.8 kg (242 lb 1 oz)  BMI 35.73 kg/m2  SpO2 96%  HEMODYNAMICS:    VENTILATOR SETTINGS:    INTAKE / OUTPUT: I/O last 3 completed shifts: In: 5580 [I.V.:2400; Other:30; IV Piggyback:3150] Out: 4625 [Urine:850; Drains:2415; Stool:1360]  PHYSICAL EXAMINATION: General:  Chronically ill appearing male, NAD Neuro:  Alert and interactive, moving all ext to command. HEENT:  Adrian/AT, PERRL, EOM-I and MMM. Cardiovascular:  RRR, Nl S1/S2, -M/R/G. Lungs:  CTA bilaterally. Abdomen:  Soft, NT, ND and +BS. Musculoskeletal:  -edema and -tenderness. Skin:  Intact.  LABS:  BMET  Recent Labs Lab 10/17/15 0348 10/18/15 0246 10/19/15 0350  NA 135 136 138  K 3.6 3.8 4.2  CL 107 109 113*  CO2 20* 21* 20*  BUN 12 9 11   CREATININE 0.40* 0.41* 0.46*  GLUCOSE 108* 93 171*    Electrolytes  Recent Labs Lab 10/17/15 0348 10/18/15 0246 10/19/15 0350  CALCIUM 8.4* 8.1* 8.6*    CBC  Recent Labs Lab 10/17/15 0348 10/18/15 0246 10/19/15 0350  WBC 11.6* 7.2 5.3  HGB 8.7* 7.9* 8.0*  HCT 27.1* 24.5* 25.8*  PLT 380 332 388    Coag's  Recent Labs Lab 10/16/15 1718 10/17/15 0847  10/18/15 1200  INR 2.45* 2.19* 2.61*    Sepsis Markers  Recent Labs Lab 10/17/15 1115 10/18/15 0246 10/18/15 0531  LATICACIDVEN 1.3 1.2 1.0    ABG No results for input(s): PHART, PCO2ART, PO2ART in the last 168 hours.  Liver Enzymes  Recent Labs Lab 10/16/15 1718  AST 27  ALT 29  ALKPHOS 150*  BILITOT 0.5  ALBUMIN 1.5*    Cardiac Enzymes No results for input(s): TROPONINI, PROBNP in the last 168 hours.  Glucose No results for input(s): GLUCAP in the last 168 hours.  Imaging No results found.   STUDIES:  CT of the abdomen with multiple abscess.  CULTURES: Blood 12/2>>> Urine 12/2>>> Abscess 12/3  GPC pairs, GNR >  ANTIBIOTICS: Rocephin 12/2>>> Vanc 12/2>>> Flagyl 12/2>>>  SIGNIFICANT EVENTS: 12/3 to IR for a drain  LINES/TUBES: PIV   DISCUSSION: 69 year old male with abdominal abscess s/p IR drain 12/5, consult for hypotension in the 70s in a patient with CHF.  Patient is mentating well and making urine.   ASSESSMENT / PLAN:   OSA - ct noct bipap, tstomy has healed  Septic shock - BP improved with fluids, did not need pressors, nml lactate reassuring - Gentle IV hydration. - Continue abx as ordered. - Monitor for signs of fluid overload given CHF history. - F/U on culture. - SUrgery to ask IR to adjust drain  Oliguria - follow , cr stable, expect to improve   Oretha Milch. MD [phone]   10/19/2015, 8:49 AM

## 2015-10-20 DIAGNOSIS — B9629 Other Escherichia coli [E. coli] as the cause of diseases classified elsewhere: Secondary | ICD-10-CM

## 2015-10-20 DIAGNOSIS — G4700 Insomnia, unspecified: Secondary | ICD-10-CM

## 2015-10-20 DIAGNOSIS — A4151 Sepsis due to Escherichia coli [E. coli]: Secondary | ICD-10-CM

## 2015-10-20 LAB — COMPREHENSIVE METABOLIC PANEL
ALBUMIN: 2 g/dL — AB (ref 3.5–5.0)
ALK PHOS: 102 U/L (ref 38–126)
ALT: 14 U/L — AB (ref 17–63)
AST: 13 U/L — AB (ref 15–41)
Anion gap: 3 — ABNORMAL LOW (ref 5–15)
BUN: 17 mg/dL (ref 6–20)
CALCIUM: 8.7 mg/dL — AB (ref 8.9–10.3)
CHLORIDE: 110 mmol/L (ref 101–111)
CO2: 20 mmol/L — AB (ref 22–32)
CREATININE: 0.38 mg/dL — AB (ref 0.61–1.24)
GFR calc Af Amer: >60 mL/min (ref >60)
GFR calc non Af Amer: >60 mL/min (ref >60)
GLUCOSE: 163 mg/dL — AB (ref 65–99)
Potassium: 4.8 mmol/L (ref 3.5–5.1)
SODIUM: 133 mmol/L — AB (ref 135–145)
Total Bilirubin: 0.2 mg/dL — ABNORMAL LOW (ref 0.3–1.2)
Total Protein: 5.7 g/dL — ABNORMAL LOW (ref 6.5–8.1)

## 2015-10-20 LAB — CBC
HCT: 27.5 % — ABNORMAL LOW (ref 39.0–52.0)
Hemoglobin: 8.4 g/dL — ABNORMAL LOW (ref 13.0–17.0)
MCH: 26.3 pg (ref 26.0–34.0)
MCHC: 30.5 g/dL (ref 30.0–36.0)
MCV: 85.9 fL (ref 78.0–100.0)
PLATELETS: 475 10*3/uL — AB (ref 150–400)
RBC: 3.2 MIL/uL — AB (ref 4.22–5.81)
RDW: 14.8 % (ref 11.5–15.5)
WBC: 9.4 10*3/uL (ref 4.0–10.5)

## 2015-10-20 LAB — VANCOMYCIN, TROUGH: VANCOMYCIN TR: 17 ug/mL (ref 10.0–20.0)

## 2015-10-20 LAB — PROTIME-INR
INR: 2.7 — ABNORMAL HIGH (ref 0.00–1.49)
Prothrombin Time: 28.2 seconds — ABNORMAL HIGH (ref 11.6–15.2)

## 2015-10-20 LAB — CULTURE, ROUTINE-ABSCESS

## 2015-10-20 LAB — I-STAT CG4 LACTIC ACID, ED: Lactic Acid, Venous: 1.68 mmol/L (ref 0.5–2.0)

## 2015-10-20 LAB — PREALBUMIN: Prealbumin: 6.1 mg/dL — ABNORMAL LOW (ref 18–38)

## 2015-10-20 MED ORDER — METRONIDAZOLE IN NACL 5-0.79 MG/ML-% IV SOLN
500.0000 mg | Freq: Three times a day (TID) | INTRAVENOUS | Status: DC
  Administered 2015-10-20 – 2015-10-29 (×27): 500 mg via INTRAVENOUS
  Filled 2015-10-20 (×27): qty 100

## 2015-10-20 MED ORDER — DEXTROSE 5 % IV SOLN
2.0000 g | INTRAVENOUS | Status: DC
  Administered 2015-10-20 – 2015-10-28 (×9): 2 g via INTRAVENOUS
  Filled 2015-10-20 (×10): qty 2

## 2015-10-20 MED ORDER — WARFARIN SODIUM 1 MG PO TABS
1.0000 mg | ORAL_TABLET | Freq: Once | ORAL | Status: AC
  Administered 2015-10-20: 1 mg via ORAL
  Filled 2015-10-20: qty 1

## 2015-10-20 MED ORDER — WARFARIN - PHARMACIST DOSING INPATIENT: Freq: Every day | Status: DC

## 2015-10-20 MED ORDER — VANCOMYCIN HCL IN DEXTROSE 1-5 GM/200ML-% IV SOLN
1000.0000 mg | Freq: Two times a day (BID) | INTRAVENOUS | Status: AC
  Administered 2015-10-20 – 2015-10-26 (×12): 1000 mg via INTRAVENOUS
  Filled 2015-10-20 (×12): qty 200

## 2015-10-20 NOTE — Progress Notes (Signed)
[facility] PROGRESS NOTE  Jonathan Le IHK:[id number] DOB: 07/17/1946 DOA: 10/16/2015 PCP: Elizabeth Palau, FNP   Brief narrative 69 year old male with medical history of CAD status post CABG in July 2016, CHF with EF of 30-35 %, hypertension and history of DVT on Coumadin, obstructive sleep apnea on CPAP, glaucoma, development of ischemic colitis in August 2016 for which he underwent excoriated a laparotomy with total colectomy and ileostomy with prolonged intubation requiring tracheostomy, followed by a complicated course with multiple abdominal and pelvic abscesses that have been drained. His last CT scan from 09/18/2015 showed fluid collection to have resolved and drains were removed. Patient was then sent to [facility]. For the last 2 weeks he was having vague abdominal pain with occasional fever. He was found to have a UTI and was started on antibiotics. However for the past 2 days he was having low blood pressure. He was sent for outpatient CT scan of the abdomen which showed a large central abdominal abscess measuring 161021 cm and was sent to the ED. Patient was found to be in septic shock with fever of 102.72F, tachycardic, hypotensive (blood pressure 83/37 mmHg) and significant leukocytosis (16.9 K). Patient admitted to stepdown unit on empiric antibiotics. General surgery consulted . Patient had drainage of his abdominal abscess by IR on 12/3.   Assessment/Plan: Septic shock Secondary to intra-abdominal abscess. BP improving. (Received IV albumin to improve intravascular volume.). D/c fluids.  . Antihypertensives on hold.  PC CM consult appreciated.  -CT drainage of the abdominal abscess by IR on 12/3.Marland Kitchen Has 2 55 mL drained past 24 hours. -Monitor  strict I/O. Lactic acid normalized.  -abscess culture growing Escherichia coli which is pansensitive. Discontinue vancomycin and cefepime. Narrow antibiotic to IV Rocephin. ID consult at for further  recommendations given recurrent abscess. Patient was treated with Unasyn followed by Augmentin for enterococcus on the abscess culture upon discharge in October 2016 (I think received total 1 month of treatment). -Patient stable to be transferred to telemetry.  CAD status post CABG with cardiomyopathy Patient had NSTEMI in July 2016 with cardiogenic shock . Three-vessel disease seen on cardiac cath with EF of 30-35% He underwent CABG. Currently has some volume overload. Lasix on hold. Off fluids. Monitor strict I/O.   Abdominal pain Secondary to abscess. Improved. Continue when necessary pain meds  Stage IV sacral decubitus ulcer Appears unchanged. Wound care consulted.  Anemia of chronic disease Reportedly had some blood in his colostomy . Mild drop in hemoglobin but around baseline. INR therapeutic. Resumed Coumadin.  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 . H&H stable.  resumed coumadin.  OSA Continue nighttime CPAP  Protein calorie malnutrition Nutrition consult.  Oliguria Possibly with hypovolemia and sepsis.renal fn normal. Monitor urine output closely.  Diet: regular ( per patient's insistance) DVT prophylaxis: Therapeutic INR  Code Status: Full code Family Communication: will update his wife. Was unable to reach her yesterday. Disposition Plan:  transfer to tele  . Needs skilled nursing facility for PT evaluation. Possibly on 12/8.   Consultants:  CCS  PCCM  IR  ID  Procedures:  CT drainage of abdominal abscess on 12/4  Antibiotics:  IV vancomycin, Flagyl 12/2-12/6  Cefepime 12/3--12/6  Cefazolin 12/6-  HPI/Subjective: Seen and examined. Denies any symptoms. No overnight issues. Blood pressure remained stable.  Objective: Filed Vitals:   10/20/15 0900 10/20/15 1000  BP: 120/71 135/62  Pulse:    Temp:    Resp: 19  15    Intake/Output Summary (Last 24 hours) at 10/20/15 1257 Last data filed at  10/20/15 1036  Gross per 24 hour  Intake    620 ml  Output   1040 ml  Net   -420 ml   Filed Weights   10/16/15 1704 10/19/15 0400  Weight: 105.235 kg (232 lb) 109.8 kg (242 lb 1 oz)    Exam:   General: Elderly male not in distress  HEENT: Pallor present, moist mucosa, supple neck  Chest: Fair bilaterally, no rhonchi or wheeze  Cardiovascular: S1 and S2 regular, 3/6 systolic murmur, no rubs or gallop  GI: Soft, nondistended, nontender, abdominal drain in place with clean dressing, ileostomy bag in place with  clear stool, bowel sounds present  Musculoskeletal: Warm, trace  pitting edema bilaterally, stage IV sacral decubitus  CNS: Alert and oriented  Data Reviewed: Basic Metabolic Panel:  Recent Labs Lab 10/16/15 1718 10/17/15 0348 10/18/15 0246 10/19/15 0350 10/20/15 0330  NA 130* 135 136 138 133*  K 4.5 3.6 3.8 4.2 4.8  CL 100* 107 109 113* 110  CO2 21* 20* 21* 20* 20*  GLUCOSE 123* 108* 93 171* 163*  BUN 11 12 9 11 17   CREATININE 0.66 0.40* 0.41* 0.46* 0.38*  CALCIUM 8.6* 8.4* 8.1* 8.6* 8.7*   Liver Function Tests:  Recent Labs Lab 10/16/15 1718 10/20/15 0330  AST 27 13*  ALT 29 14*  ALKPHOS 150* 102  BILITOT 0.5 0.2*  PROT 5.8* 5.7*  ALBUMIN 1.5* 2.0*    Recent Labs Lab 10/16/15 1718  LIPASE 18   No results for input(s): AMMONIA in the last 168 hours. CBC:  Recent Labs Lab 10/16/15 1718 10/17/15 0348 10/18/15 0246 10/19/15 0350 10/20/15 0330  WBC 16.9* 11.6* 7.2 5.3 9.4  NEUTROABS 14.0*  --   --   --   --   HGB 9.5* 8.7* 7.9* 8.0* 8.4*  HCT 28.8* 27.1* 24.5* 25.8* 27.5*  MCV 84.0 85.5 84.2 86.3 85.9  PLT 395 380 332 388 475*   Cardiac Enzymes: No results for input(s): CKTOTAL, CKMB, CKMBINDEX, TROPONINI in the last 168 hours. BNP (last 3 results)  Recent Labs  07/28/15 0500 08/02/15 0620 08/16/15 0701  BNP 453.8* 367.7* 528.3*    ProBNP (last 3 results) No results for input(s): PROBNP in the last 8760  hours.  CBG: No results for input(s): GLUCAP in the last 168 hours.  Recent Results (from the past 240 hour(s))  Blood Culture (routine x 2)     Status: None (Preliminary result)   Collection Time: 10/16/15  5:18 PM  Result Value Ref Range Status   Specimen Description BLOOD RIGHT ARM  Final   Special Requests BOTTLES DRAWN AEROBIC AND ANAEROBIC  Final   Culture NO GROWTH 4 DAYS  Final   Report Status PENDING  Incomplete  Blood Culture (routine x 2)     Status: None (Preliminary result)   Collection Time: 10/16/15  5:53 PM  Result Value Ref Range Status   Specimen Description BLOOD RIGHT HAND  Final   Special Requests BOTTLES DRAWN AEROBIC AND ANAEROBIC  Final   Culture NO GROWTH 4 DAYS  Final   Report Status PENDING  Incomplete  Urine culture     Status: None   Collection Time: 10/16/15  6:40 PM  Result Value Ref Range Status   Specimen Description URINE, CLEAN CATCH  Final   Special Requests NONE  Final   Culture MULTIPLE SPECIES PRESENT, SUGGEST RECOLLECTION  Final  Report Status 10/18/2015 FINAL  Final  Culture, routine-abscess     Status: None   Collection Time: 10/17/15  3:20 PM  Result Value Ref Range Status   Specimen Description PERITONEAL ABSCESS  Final   Special Requests NONE  Final   Gram Stain   Final    ABUNDANT WBC PRESENT, PREDOMINANTLY PMN NO SQUAMOUS EPITHELIAL CELLS SEEN ABUNDANT GRAM POSITIVE COCCI IN PAIRS IN CHAINS MODERATE GRAM NEGATIVE RODS Performed at [facility]    Culture   Final    ABUNDANT ESCHERICHIA COLI Performed at [facility]    Report Status 10/20/2015 FINAL  Final   Organism ID, Bacteria ESCHERICHIA COLI  Final      Susceptibility   Escherichia coli - MIC*    AMPICILLIN <=2 SENSITIVE Sensitive     AMPICILLIN/SULBACTAM <=2 SENSITIVE Sensitive     CEFAZOLIN <=4 SENSITIVE Sensitive     CEFEPIME <=1 SENSITIVE Sensitive     CEFTAZIDIME <=1 SENSITIVE Sensitive     CEFTRIAXONE <=1 SENSITIVE Sensitive      CIPROFLOXACIN <=0.25 SENSITIVE Sensitive     GENTAMICIN <=1 SENSITIVE Sensitive     IMIPENEM <=0.25 SENSITIVE Sensitive     PIP/TAZO <=4 SENSITIVE Sensitive     TOBRAMYCIN <=1 SENSITIVE Sensitive     TRIMETH/SULFA Value in next row Sensitive      <=20 SENSITIVE(NOTE)    * ABUNDANT ESCHERICHIA COLI  Anaerobic culture     Status: None (Preliminary result)   Collection Time: 10/17/15  3:20 PM  Result Value Ref Range Status   Specimen Description PERITONEAL ABSCESS  Final   Special Requests NONE  Final   Gram Stain   Final    ABUNDANT WBC PRESENT, PREDOMINANTLY PMN NO SQUAMOUS EPITHELIAL CELLS SEEN ABUNDANT GRAM POSITIVE COCCI IN PAIRS IN CHAINS MODERATE GRAM NEGATIVE RODS Performed at [facility]    Culture   Final    NO ANAEROBES ISOLATED; CULTURE IN PROGRESS FOR 5 DAYS Performed at [facility]    Report Status PENDING  Incomplete  MRSA PCR Screening     Status: None   Collection Time: 10/18/15  8:32 PM  Result Value Ref Range Status   MRSA by PCR NEGATIVE NEGATIVE Final    Comment:        The GeneXpert MRSA Assay (FDA approved for NASAL specimens only), is one component of a comprehensive MRSA colonization surveillance program. It is not intended to diagnose MRSA infection nor to guide or monitor treatment for MRSA infections.      Studies: No results found.  Scheduled Meds: . antiseptic oral rinse  15 mL Mouth Rinse TID PC  . ceFEPime (MAXIPIME) IV  2 g Intravenous 3 times per day  . feeding supplement (PRO-STAT SUGAR FREE 64)  30 mL Oral Daily  . hydrocortisone sodium succinate  50 mg Intravenous 4 times per day  . magnesium oxide  800 mg Oral BID  . metronidazole  500 mg Intravenous Q8H  . multivitamin with minerals  1 tablet Oral Daily  . nutrition supplement (JUVEN)  1 packet Oral BID BM  . pantoprazole  40 mg Oral Daily  . potassium chloride  30 mEq Oral BID  . vancomycin  1,000 mg Intravenous Q12H  . warfarin  1 mg Oral ONCE-1800  .  Warfarin - Pharmacist Dosing Inpatient   Does not apply q1800   Continuous Infusions:      Time spent: 25 minutes    Ithan Touhey  [facility] Pager [phone].  If 7PM-7AM, please contact night-coverage at [email or web address], password [email or web address] 10/20/2015, 12:57 PM  LOS: 4 days

## 2015-10-20 NOTE — Progress Notes (Signed)
Pharmacy Antibiotic Time-Out Note  Jonathan Le is a 69 y.o. year-old male admitted on 10/16/2015.  The patient is currently on Vancomycin, Flagyl, and Cefepime for sepsis, IAI with multiple abdominal abscesses with positive Ecoli culture  Assessment/Plan: Abx doses remain appropriate but would recommend to discontinue vancomycin and flagyl and narrow cefepime to Ancef for treatment of Ecoli abscess  Temp (24hrs), Avg:97.8 F (36.6 C), Min:97.4 F (36.3 C), Max:98.7 F (37.1 C)   Recent Labs Lab 10/16/15 1718 10/17/15 0348 10/18/15 0246 10/19/15 0350 10/20/15 0330  WBC 16.9* 11.6* 7.2 5.3 9.4    Recent Labs Lab 10/16/15 1718 10/17/15 0348 10/18/15 0246 10/19/15 0350 10/20/15 0330  CREATININE 0.66 0.40* 0.41* 0.46* 0.38*   Estimated Creatinine Clearance: 104.9 mL/min (by C-G formula based on Cr of 0.38).   Antimicrobial allergies: Unasyn, PCN and doxy  12/3 >> rocephin >> 12/4 12/3 >> flagyl >>  12/3 >> vancomycin >> 12/4 >> cefepime >>  12/2 blood:NGTD 12/2 urine: multiple species present 12/3 peritoneal abscess: abundant GNR - Ecoli - pansensitive  Thank you for allowing pharmacy to be a part of this patient's care.  Hessie KnowsJustin M Arafat Cocuzza, PharmD, BCPS Pager [phone] 10/20/2015 10:20 AM

## 2015-10-20 NOTE — Progress Notes (Signed)
Subjective: Alert and cooperative, we have ask the staff to work on mobilizing more.  He has no real complaints, still doesn't understand why abscess keeps reoccurring. Ileostomy working well.  Objective: Vital signs in last 24 hours: Temp:  [97.4 F (36.3 C)-98.7 F (37.1 C)] 97.4 F (36.3 C) (12/06 0803) Pulse Rate:  [73-93] 73 (12/05 2231) Resp:  [16-32] 17 (12/06 0700) BP: (98-170)/(49-142) 109/63 mmHg (12/06 0700) SpO2:  [92 %-100 %] 92 % (12/06 0700) Last BM Date: 10/19/15 1061 IV fluids Po not recorded Stool 500 Drain 255 Afebrile, VSS Labs OK prealbumin is 6.1 INR 2.7   ESCHERICHIA COLI  From abscess culture    Antibiotic Sensitivity Microscan Status    AMPICILLIN Sensitive <=2 SENSITIVE Final    Method: MIC    AMPICILLIN/SULBACTAM Sensitive <=2 SENSITIVE Final    Method: MIC    CEFAZOLIN Sensitive <=4 SENSITIVE Final    Method: MIC    CEFEPIME Sensitive <=1 SENSITIVE Final    Method: MIC    CEFTAZIDIME Sensitive <=1 SENSITIVE Final    Method: MIC    CEFTRIAXONE Sensitive <=1 SENSITIVE Final    Method: MIC    CIPROFLOXACIN Sensitive <=0.25 SENSITIVE Final    Method: MIC    GENTAMICIN Sensitive <=1 SENSITIVE Final    Method: MIC    IMIPENEM Sensitive <=0.25 SENSITIVE Final    Method: MIC    PIP/TAZO Sensitive <=4 SENSITIVE Final    Method: MIC    TOBRAMYCIN Sensitive <=1 SENSITIVE Final    Method: MIC    TRIMETH/SULFA Sensitive <=20 SENSITIVE (NOTE) Final    Method: MIC    Comments ESCHERICHIA COLI (MIC)    ABUNDANT ESCHERICHIA COLI               Specimen Collected: 10/17/15 3:20 PM Last Resulted: 10/20/15 7:35 AM           Intake/Output from previous day: 12/05 0701 - 12/06 0700 In: 1061.3 [I.V.:331.3; IV Piggyback:700] Out: 1105 [Urine:350; Drains:255; Stool:500] Intake/Output this shift:    General appearance: alert, cooperative and no distress GI: soft a little sore, + BS,  ostomy working well.  Lab Results:   Recent Labs  10/19/15 0350 10/20/15 0330  WBC 5.3 9.4  HGB 8.0* 8.4*  HCT 25.8* 27.5*  PLT 388 475*    BMET  Recent Labs  10/19/15 0350 10/20/15 0330  NA 138 133*  K 4.2 4.8  CL 113* 110  CO2 20* 20*  GLUCOSE 171* 163*  BUN 11 17  CREATININE 0.46* 0.38*  CALCIUM 8.6* 8.7*   PT/INR  Recent Labs  10/19/15 1115 10/20/15 0330  LABPROT 28.4* 28.2*  INR 2.72* 2.70*     Recent Labs Lab 10/16/15 1718 10/20/15 0330  AST 27 13*  ALT 29 14*  ALKPHOS 150* 102  BILITOT 0.5 0.2*  PROT 5.8* 5.7*  ALBUMIN 1.5* 2.0*     Lipase     Component Value Date/Time   LIPASE 18 10/16/2015 1718     Studies/Results: No results found.  Medications: . antiseptic oral rinse  15 mL Mouth Rinse TID PC  . ceFEPime (MAXIPIME) IV  2 g Intravenous 3 times per day  . feeding supplement (PRO-STAT SUGAR FREE 64)  30 mL Oral Daily  . hydrocortisone sodium succinate  50 mg Intravenous 4 times per day  . magnesium oxide  800 mg Oral BID  . metronidazole  500 mg Intravenous Q8H  . multivitamin with minerals  1 tablet Oral Daily  . nutrition  supplement (JUVEN)  1 packet Oral BID BM  . pantoprazole  40 mg Oral Daily  . potassium chloride  30 mEq Oral BID  . vancomycin  1,000 mg Intravenous Q12H    Assessment/Plan IR drain placement 10/17/15; (culture report noted above) S/p total abdomina colectomy with ileostomy secondary to ischemic colitis, 06/18/15, Dr. Dwain Sarna, closure of abdominal wall, wound vac and ileostomy - 06/20/15, Dr. Donell Beers; after CABG 05/22/15, Dr. Kathlee Nations Trigt. S/p hospitalized 10/13-11/11/16 for intraabdominal abscess drainage, possible stump breakdown. OSA with CPAP SACRAL DECUBITUS - currently clean and looks good Chronic respiratory failure - resolved  Chronic systolic CHF (EF 16%)  Hypertension DVT: on coumadin (INR 2.70 today, no coumadin since admission that I can see.) Atrial fibrillation Malnutrition  Prealbumin  6.1 10/20/15 Debility  Antibiotics: Day 4 Vancomycin, Flagyl, day 4 cephalosporin, now on cefepime day 4.   Plan:  No real change, he is going to the floor today.  Less of the same purulent drainage from the IR drain.  I discussed his nutrition and the need to get some additional protein into him.  I will ask Dietician to see and help with this also.  Continue antibiotics.  He is sensitive to the cefepime, I think we can stop the vancomycin will talk with Dr. Gonzella Lex and Abbey Chatters.         LOS: 4 days    Stephani Janak 10/20/2015

## 2015-10-20 NOTE — Evaluation (Signed)
Occupational Therapy Evaluation Patient Details Name: Jonathan Le MRN: [id number] DOB: 1946-07-31 Today's Date: 10/20/2015    History of Present Illness Jonathan Le is a 69 y.o. male with a history of CAD S/P CABG, CHF, HTN, Hx DVT n, OSA, and Glaucoma with a complex recent surgical history who was sent from the [facility] 10/16/15  due to fevers and chills for the last 2 weeks . CT scan which revealed a large abscess 16 x 10 x 21 cm and he was sent to the ED for admission and treatment. prior history:.After CABG in 7/16,Patient has been in LTACH, CIR, and lastly at [facility].. Has a sacral dcubitus thet was treated in past with PLS and VAC attempts per patient. Has a colostomy and L drain from abdomen.   Clinical Impression   Pt was admitted for the above.  He has had a long medical and rehab course since sxs and now has an abscess.  Pt is very motivated and will benefit from skilled OT to increase safety and independence with adls.  Goals in acute are for min A for bed mobility and sitting for adls.  Pt will benefit from skilled OT in acute as well as follow up OT at SNF.    Follow Up Recommendations  SNF    Equipment Recommendations   (to be further assessed--has some movement through rectum)    Recommendations for Other Services       Precautions / Restrictions Precautions Precautions: Fall Precaution Comments: sacral decub, drain lines from abdomen; knee pain, colostomy Restrictions Weight Bearing Restrictions: No      Mobility Bed Mobility     Rolling: Mod assist         General bed mobility comments: utilized pad to assist pt onto L side and repositioned with pillows to keep off buttocks  Transfers                      Balance                                            ADL Overall ADL's : Needs assistance/impaired                                       General ADL Comments: pt is able to  perform UB adls with set up, bed level.  Rolled to reposition:  pt with stage IV sacral ulcer per chart:  did not sit up.  Pt states he worked with theraband when he was at the SNF--will plan to drop some off today. Pt fell asleep at end of session once repositioned, mid conversation.  Returned to bring theraband:  pt very upset about his situation; very motivated to work; wants to get stronger.  Asked if he wanted to talk to a chaplain, but he does not want to at this time.  NT came to assist with urinal (using male style).  Reviewed shoulder flexion and abduction exercises with level 1 band.     Vision     Perception     Praxis      Pertinent Vitals/Pain Pain Assessment: Faces Faces Pain Scale: Hurts even more Pain Location: buttocks Pain Intervention(s): Repositioned     Hand Dominance     Extremity/Trunk Assessment Upper  Extremity Assessment Upper Extremity Assessment: Generalized weakness (grossly 4-/5 has R shoulder pain--elevates traps)           Communication Communication Communication: No difficulties   Cognition Arousal/Alertness: Awake/alert Behavior During Therapy: WFL for tasks assessed/performed Overall Cognitive Status: Within Functional Limits for tasks assessed                     General Comments       Exercises       Shoulder Instructions      Home Living Family/patient expects to be discharged to:: Skilled nursing facility                                        Prior Functioning/Environment Level of Independence: Needs assistance        Comments: has needed assistance for adls/mobility since sxs.    OT Diagnosis: Generalized weakness;Acute pain   OT Problem List: Decreased strength;Decreased activity tolerance;Impaired balance (sitting and/or standing);Decreased knowledge of use of DME or AE;Pain   OT Treatment/Interventions: Self-care/ADL training;DME and/or AE instruction;Patient/family education;Balance  training;Therapeutic activities    OT Goals(Current goals can be found in the care plan section) Acute Rehab OT Goals Patient Stated Goal: get strength back OT Goal Formulation: With patient Time For Goal Achievement: 11/03/15 Potential to Achieve Goals: Good ADL Goals Additional ADL Goal #1: pt will be independent with written HEP, bil UEs level 1 theraband Additional ADL Goal #2: pt will sit eob for 5 minutes with min guard assist (once assisted with +2 assistance and work on static sitting for ADLs and in preparation for standing for adls Additional ADL Goal #3: pt will roll and perform sidelying to sit with min A in preparation for adls  OT Frequency: Min 2X/week   Barriers to D/C:            Co-evaluation              End of Session Nurse Communication:  (bed alarm)  Activity Tolerance: Patient limited by fatigue Patient left: in bed;with call bell/phone within reach (unable to set bed alarm)   Time: 1352-1417 and 1305-1320 OT Time Calculation (min): 40 min Charges:  OT General Charges $OT Visit: 1 Procedure OT Evaluation $Initial OT Evaluation Tier I: 1 Procedure OT Treatments $Therapeutic Activity: 8-22 mins G-Codes:    Jonathan Le 10/20/2015, 3:55 PM Jonathan Le, OTR/L [phone] 10/20/2015

## 2015-10-20 NOTE — Consult Note (Signed)
[facility]       Reason for Consult: intra-abdominal abscess    Referring Physician: Dr Gonzella Lex  Principal Problem:   Abdominal abscess (HCC) Active Problems:   OSA on CPAP   CAD, multiple vessel   Abdominal pain, generalized   Sacral decubitus ulcer   Chronic systolic CHF (congestive heart failure) (HCC)   Sepsis (HCC)   Warfarin-induced coagulopathy (HCC)   Anemia of chronic disease   Hyponatremia   Hypotension   Acute pulmonary edema (HCC)   Congestive heart failure (HCC)   Hypoxemia   Arterial hypotension   Severe sepsis (HCC)   . antiseptic oral rinse  15 mL Mouth Rinse TID PC  . cefTRIAXone (ROCEPHIN)  IV  2 g Intravenous Q24H  . feeding supplement (PRO-STAT SUGAR FREE 64)  30 mL Oral Daily  . hydrocortisone sodium succinate  50 mg Intravenous 4 times per day  . magnesium oxide  800 mg Oral BID  . metronidazole  500 mg Intravenous Q8H  . multivitamin with minerals  1 tablet Oral Daily  . nutrition supplement (JUVEN)  1 packet Oral BID BM  . pantoprazole  40 mg Oral Daily  . potassium chloride  30 mEq Oral BID  . warfarin  1 mg Oral ONCE-1800  . Warfarin - Pharmacist Dosing Inpatient   Does not apply q1800    Recommendations: Ceftriaxone and flagyl Continue vancomycin for GPC in pairs in gram stain Will d/c cefepime   Assessment: He has an intra-abdominal abscess with pansensitive E coli growing.  This is considered mixed.  Previously had Enterococcus but not growing now but gram stain with GPC suggesting this could still be present.   Insomnia and is asking for nightly Ambien.      Antibiotics: Vancomycin, cefepime  HPI: Jonathan Le is a 69 y.o. 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  These were drained and grew Enterococcus and he  was treated with vancomycin and amp/sulbactam for a prolonged course and changed to Primaxin due to hives during rehab.  CT showed resolution after the initial course but then returned 12/2 for abdominal pain and CT c/w large central abdominal abscess tracking to the abdominal wall.  On 12/4 this was drained by Dr. Fredia Sorrow.  Has now grown out E coli, gram stain with GNR and GPC in pairs.  Draining pus now and 255 cc documented the previous 24 hours. Patient tearful during the interview and exam.   CT scan independently reviewed and noted considerable abscess.   Previous hospitalization reviewed.  Enterococcus, previous abscess, antibiotic allergy on Unasyn.   WBC wnl now.   Review of Systems:  Constitutional: negative for fevers and chills Cardiovascular: negative for chest pain Neurological: negative for dizziness Behavioral/Psych: positive for anxiety All other systems reviewed and are negative   Past Medical History  Diagnosis Date  . Anxiety   . Hypertension   . Arthritis   . Sleep apnea     wears CPAP nightly  . Glaucoma   . CHF (congestive heart failure) (HCC)     MI, with open heart surgery x 30 sdays ago per pt. (06/26/15)  . S/P CABG (coronary artery bypass graft)   . MI (myocardial infarction) [facility])     Social History  Substance Use Topics  . Smoking status: Never Smoker   .  Smokeless tobacco: Never Used  . Alcohol Use: No    Family History  Problem Relation Age of Onset  . Alzheimer's disease Father   . Rheum arthritis Mother     Allergies  Allergen Reactions  . Unasyn [Ampicillin-Sulbactam Sodium] Hives, Itching and Rash    Diffuse rash not responding to Pepcid and Benadryl.  TDD.  Marland Kitchen Levaquin [Levofloxacin] Nausea Only  . Penicillins Hives  . Doxycycline Rash    Physical Exam: Constitutional: in no apparent distress and alert  Filed Vitals:   10/20/15 1000 10/20/15 1308  BP: 135/62 102/66  Pulse:  73  Temp:  97.6 F (36.4 C)  Resp: 15 16   EYES:  anicteric ENMT: no thrush Cardiovascular: Cor RRR and No murmurs Respiratory: CTA B; normal respiratory effort GI: ostomy on right.  Left with bandage covering drain.  Pus noted in about 1/2 bulb.  Musculoskeletal: peripheral pulses normal, no pedal edema, no clubbing or cyanosis Skin: negatives: no rash Hematologic: no cervical lad  Lab Results  Component Value Date   WBC 9.4 10/20/2015   HGB 8.4* 10/20/2015   HCT 27.5* 10/20/2015   MCV 85.9 10/20/2015   PLT 475* 10/20/2015    Lab Results  Component Value Date   CREATININE 0.38* 10/20/2015   BUN 17 10/20/2015   NA 133* 10/20/2015   K 4.8 10/20/2015   CL 110 10/20/2015   CO2 20* 10/20/2015    Lab Results  Component Value Date   ALT 14* 10/20/2015   AST 13* 10/20/2015   ALKPHOS 102 10/20/2015     Microbiology: Recent Results (from the past 240 hour(s))  Blood Culture (routine x 2)     Status: None (Preliminary result)   Collection Time: 10/16/15  5:18 PM  Result Value Ref Range Status   Specimen Description BLOOD RIGHT ARM  Final   Special Requests BOTTLES DRAWN AEROBIC AND ANAEROBIC  Final   Culture NO GROWTH 4 DAYS  Final   Report Status PENDING  Incomplete  Blood Culture (routine x 2)     Status: None (Preliminary result)   Collection Time: 10/16/15  5:53 PM  Result Value Ref Range Status   Specimen Description BLOOD RIGHT HAND  Final   Special Requests BOTTLES DRAWN AEROBIC AND ANAEROBIC  Final   Culture NO GROWTH 4 DAYS  Final   Report Status PENDING  Incomplete  Urine culture     Status: None   Collection Time: 10/16/15  6:40 PM  Result Value Ref Range Status   Specimen Description URINE, CLEAN CATCH  Final   Special Requests NONE  Final   Culture MULTIPLE SPECIES PRESENT, SUGGEST RECOLLECTION  Final   Report Status 10/18/2015 FINAL  Final  Culture, routine-abscess     Status: None   Collection Time: 10/17/15  3:20 PM  Result Value Ref Range Status   Specimen Description PERITONEAL ABSCESS   Final   Special Requests NONE  Final   Gram Stain   Final    ABUNDANT WBC PRESENT, PREDOMINANTLY PMN NO SQUAMOUS EPITHELIAL CELLS SEEN ABUNDANT GRAM POSITIVE COCCI IN PAIRS IN CHAINS MODERATE GRAM NEGATIVE RODS Performed at [facility]    Culture   Final    ABUNDANT ESCHERICHIA COLI Performed at [facility]    Report Status 10/20/2015 FINAL  Final   Organism ID, Bacteria ESCHERICHIA COLI  Final      Susceptibility   Escherichia coli - MIC*    AMPICILLIN <=2 SENSITIVE Sensitive  AMPICILLIN/SULBACTAM <=2 SENSITIVE Sensitive     CEFAZOLIN <=4 SENSITIVE Sensitive     CEFEPIME <=1 SENSITIVE Sensitive     CEFTAZIDIME <=1 SENSITIVE Sensitive     CEFTRIAXONE <=1 SENSITIVE Sensitive     CIPROFLOXACIN <=0.25 SENSITIVE Sensitive     GENTAMICIN <=1 SENSITIVE Sensitive     IMIPENEM <=0.25 SENSITIVE Sensitive     PIP/TAZO <=4 SENSITIVE Sensitive     TOBRAMYCIN <=1 SENSITIVE Sensitive     TRIMETH/SULFA Value in next row Sensitive      <=20 SENSITIVE(NOTE)    * ABUNDANT ESCHERICHIA COLI  Anaerobic culture     Status: None (Preliminary result)   Collection Time: 10/17/15  3:20 PM  Result Value Ref Range Status   Specimen Description PERITONEAL ABSCESS  Final   Special Requests NONE  Final   Gram Stain   Final    ABUNDANT WBC PRESENT, PREDOMINANTLY PMN NO SQUAMOUS EPITHELIAL CELLS SEEN ABUNDANT GRAM POSITIVE COCCI IN PAIRS IN CHAINS MODERATE GRAM NEGATIVE RODS [facility] at [facility]    Culture   Final    NO ANAEROBES ISOLATED; CULTURE IN PROGRESS FOR 5 DAYS [facility]formed at [facility]    Report Status PENDING  Incomplete  MRSA PCR Screening     Status: None   Collection Time: 10/18/15  8:32 PM  Result Value Ref Range Status   MRSA by PCR NEGATIVE NEGATIVE Final    Comment:        The GeneXpert MRSA Assay (FDA approved for NASAL specimens only), is one component of a comprehensive MRSA colonization surveillance program. It is not intended  to diagnose MRSA infection nor to guide or monitor treatment for MRSA infections.     Staci RighterOMER, Etan Vasudevan, MD [facility] [email or web address] [phone] pager  [phone] cell 10/20/2015, 3:42 PM

## 2015-10-20 NOTE — Progress Notes (Signed)
Patient ID: Jonathan Le, male   DOB: March 01, 1946, 69 y.o.   MRN: [id number]          Subjective: Patient doing okay, denies significant abdominal pain, nausea/ vomiting at present.   Allergies: Unasyn; Levaquin; Penicillins; and Doxycycline  Medications: Prior to Admission medications   Medication Sig Start Date End Date Taking? Authorizing Provider  acetaminophen (TYLENOL) 325 MG tablet Take 2 tablets (650 mg total) by mouth 2 (two) times daily with breakfast and lunch. 09/24/15  Yes Evlyn Kanner Love, PA-C  Amino Acids-Protein Hydrolys (FEEDING SUPPLEMENT, PRO-STAT SUGAR FREE 64,) LIQD Take 30 mLs by mouth 3 (three) times daily. 09/24/15  Yes Evlyn Kanner Love, PA-C  antiseptic oral rinse (BIOTENE) LIQD 15 mLs by Mouth Rinse route 3 (three) times daily after meals. 09/24/15  Yes Evlyn Kanner Love, PA-C  diclofenac sodium (VOLTAREN) 1 % GEL Apply 2 g topically 4 (four) times daily. 09/24/15  Yes Evlyn Kanner Love, PA-C  famotidine (PEPCID) 20 MG tablet Take 1 tablet (20 mg total) by mouth 2 (two) times daily. 09/24/15  Yes Evlyn Kanner Love, PA-C  finasteride (PROSCAR) 5 MG tablet Take 1 tablet (5 mg total) by mouth daily. 09/24/15  Yes Evlyn Kanner Love, PA-C  furosemide (LASIX) 40 MG tablet Take 1 tablet (40 mg total) by mouth daily. 09/24/15  Yes Evlyn Kanner Love, PA-C  HYDROcodone-acetaminophen (NORCO/VICODIN) 5-325 MG tablet Take 1 tablet by mouth every 6 (six) hours as needed for moderate pain.   Yes Historical Provider, MD  magnesium oxide (MAG-OX) 400 (241.3 MG) MG tablet Take 2 tablets (800 mg total) by mouth 2 (two) times daily. 09/24/15  Yes Evlyn Kanner Love, PA-C  Menthol-Methyl Salicylate (MUSCLE RUB) 10-15 % CREA Apply 1 application topically 3 (three) times daily before meals. 09/24/15  Yes Evlyn Kanner Love, PA-C  Multiple Vitamins-Minerals (DECUBI-VITE PO) Take 1 tablet by mouth daily.   Yes Historical Provider, MD  ondansetron (ZOFRAN) 4 MG tablet Take 1 tablet (4 mg total) by mouth every 6 (six)  hours as needed for nausea. 09/24/15  Yes Evlyn Kanner Love, PA-C  pantoprazole (PROTONIX) 40 MG tablet Take 40 mg by mouth daily.   Yes Historical Provider, MD  potassium chloride (K-DUR,KLOR-CON) 10 MEQ tablet Take 30 mEq by mouth 2 (two) times daily.   Yes Historical Provider, MD  PRESCRIPTION MEDICATION Take 120 mLs by mouth 2 (two) times daily. Medpass   Yes Historical Provider, MD  tamsulosin (FLOMAX) 0.4 MG CAPS capsule Take 1 capsule (0.4 mg total) by mouth daily after supper. 09/24/15  Yes Evlyn Kanner Love, PA-C  warfarin (COUMADIN) 5 MG tablet Take 1 tablet (5 mg total) by mouth daily at 6 PM. 09/24/15  Yes Evlyn Kanner Love, PA-C     Vital Signs: BP 102/66 mmHg  Pulse 73  Temp(Src) 97.6 F (36.4 C) (Oral)  Resp 16  Ht  (1.753 m)  Wt 242 lb 1 oz (109.8 kg)  BMI 35.73 kg/m2  SpO2 98%  Physical Exam left abdominal drain intact, insertion site okay, nontender to palpation, output 90 mL of cream-colored fluid, cultures-Escherichia coli  Imaging: Ct Image Guided Drainage By Percutaneous Catheter  10/18/2015  CLINICAL DATA:  History of colectomy, ileostomy and previous drainage of multiple peritoneal abscesses. Development of large dominant intraperitoneal abscess requiring drainage. EXAM: CT GUIDED DRAINAGE OF PERITONEAL ABSCESS COMPARISON:  CT of the abdomen and pelvis on 10/16/2015 ANESTHESIA/SEDATION: 1.0 Mg IV Versed 50 mcg IV Fentanyl Total Moderate Sedation Time:  32 minutes.  PROCEDURE: The procedure, risks, benefits, and alternatives were explained to the patient. Questions regarding the procedure were encouraged and answered. The patient understands and consents to the procedure. A time-out was performed prior to initiating the procedure. The anterior abdominal wall was prepped with Betadine in a sterile fashion, and a sterile drape was applied covering the operative field. A sterile gown and sterile gloves were used for the procedure. Local anesthesia was provided with 1% Lidocaine.  CT was performed of the abdomen and upper pelvis in a supine position. After choosing a site along the left anterior abdominal wall for drainage, an 18 gauge trocar needle was advanced into a peritoneal abscess collection. Aspiration of fluid was performed and a fluid sample sent for culture analysis. A guidewire was advanced into the collection. After tract dilatation, a 12 French percutaneous drain was placed. Drainage catheter position was confirmed by CT. The catheter was flushed and connected to a suction bulb. It was secured at the skin with a Prolene retention suture and StatLock device. COMPLICATIONS: None FINDINGS: Large peritoneal abscess was localized. Puncture of the collection was performed just to the left of midline. Aspiration yielded grossly purulent fluid. After drainage catheter placement, there is copious return of purulent fluid. IMPRESSION: CT-guided percutaneous catheter drainage of large intraperitoneal abscess. A 12 French drain was placed and attached to suction bulb drainage. A purulent fluid sample was sent for culture analysis. Electronically Signed   By: Irish Lack M.D.   On: 10/18/2015 09:13    Labs:  CBC:  Recent Labs  10/17/15 0348 10/18/15 0246 10/19/15 0350 10/20/15 0330  WBC 11.6* 7.2 5.3 9.4  HGB 8.7* 7.9* 8.0* 8.4*  HCT 27.1* 24.5* 25.8* 27.5*  PLT 380 332 388 475*    COAGS:  Recent Labs  07/21/15 0815  07/23/15 1200 07/25/15 0419 07/30/15 0627  10/17/15 0847 10/18/15 1200 10/19/15 1115 10/20/15 0330  INR 1.65*  < > 2.62* 1.48 1.35  < > 2.19* 2.61* 2.72* 2.70*  APTT 29  --  38* 28 31  --   --   --   --   --   < > = values in this interval not displayed.  BMP:  Recent Labs  10/17/15 0348 10/18/15 0246 10/19/15 0350 10/20/15 0330  NA 135 136 138 133*  K 3.6 3.8 4.2 4.8  CL 107 109 113* 110  CO2 20* 21* 20* 20*  GLUCOSE 108* 93 171* 163*  BUN CALCIUM 8.4* 8.1* 8.6* 8.7*  CREATININE 0.40* 0.41* 0.46* 0.38*  GFRNONAA  >60 >60 >60 >60  GFRAA >60 >60 >60 >60    LIVER FUNCTION TESTS:  Recent Labs  07/25/15 0419  08/16/15 0701 08/28/15 0430 10/16/15 1718 10/20/15 0330  BILITOT 0.9  --   --  0.6 0.5 0.2*  AST 35  --   --  22 27 13*  ALT 14*  --   --  10* 29 14*  ALKPHOS 199*  --   --  80 150* 102  PROT 4.5*  --   --  5.2* 5.8* 5.7*  ALBUMIN <1.0*  < > 1.2* 1.6* 1.5* 2.0*  < > = values in this interval not displayed.  Assessment and Plan: Status post drainage of large intraperitoneal abscess 12/4; afebrile; WBC 9.4, hemoglobin 8.4, abscess cultures-E coli; continue with drain irrigation, closely monitor output and obtain follow-up CT scan within 1 week of drain placement. Will most likely need drain injection prior to removal.   Signed:  D. Jeananne RamaKevin Allred 10/20/2015, 5:00 PM   I spent a total of 15 minutes at the the patient's bedside AND on the patient's hospital floor or unit, greater than 50% of which was counseling/coordinating care for abdominal abscess drainage

## 2015-10-20 NOTE — Progress Notes (Signed)
Nutrition Brief Note  RD consulted for wound healing - increased protein intake.  Pt was seen by RD yesterday, currently receiving two packets of Juven and 1 Pro-Stat/day adding a total of 43g of protein and 200 calories in addition to PO intake.  No PO intake logged since 12/3. Pt reports he has been eating well.  No further RD interventions needed at this time.  Jonathan AnoWilliam M. Raetta Agostinelli, MS, RD LDN After Hours/Weekend Pager [phone]

## 2015-10-20 NOTE — NC FL2 (Signed)
[place] MEDICAID FL2 LEVEL OF CARE SCREENING TOOL     IDENTIFICATION  Patient Name: Jonathan Le Birthdate: 1946/04/22 Sex: male Admission Date (Current Location): 10/16/2015  [facility] and [place] Number:     Facility and Address:  [facility],  [address] [place] [address]      Provider Number: [id number]  Attending Physician Name and Address:  Eddie North, MD  Relative Name and Phone Number:       Current Level of Care: Hospital Recommended Level of Care: Skilled Nursing Facility Prior Approval Number:    Date Approved/Denied:   PASRR Number: [id number]  Discharge Plan: SNF    Current Diagnoses: Patient Active Problem List   Diagnosis Date Noted  . Hypoxemia   . Arterial hypotension   . Severe sepsis (HCC)   . Acute pulmonary edema (HCC)   . Congestive heart failure (HCC)   . Sepsis (HCC) 10/16/2015  . Warfarin-induced coagulopathy (HCC) 10/16/2015  . Anemia of chronic disease 10/16/2015  . Hyponatremia 10/16/2015  . Hypotension 10/16/2015  . Chronic systolic CHF (congestive heart failure) (HCC) 09/25/2015  . Tracheostomy care (HCC) 09/18/2015  . Chronic respiratory failure (HCC) 09/17/2015  . Abscess of abdominal cavity (HCC)   . Enterococcal infection   . Sacral decubitus ulcer   . Hypoalbuminemia due to protein-calorie malnutrition (HCC)   . 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 Intra-abdominal abscess (HCC)   . Debility 08/27/2015  . Acute on chronic respiratory failure, unspecified whether with hypoxia or hypercapnia (HCC)   . Encounter for orogastric (OG) tube placement   . Abdominal abscess (HCC)   . Abscess   . Encounter for central line placement   . Encounter for intubation   . Respiratory failure (HCC)   . Septic shock (HCC)   . Blood poisoning (HCC)   . Tracheostomy status (HCC)   . Acute respiratory failure (HCC)   . Pressure ulcer 06/21/2015  . Shock (HCC)  06/18/2015  . Abdominal pain, generalized   . Shortness of breath   . Hypoxia   . 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 Cardiomyopathy, ischemic 05/21/2015  . CAD, multiple vessel 05/21/2015  . Acute systolic congestive heart failure, NYHA class 3 (HCC) 05/21/2015  . Morbid obesity (HCC) 05/20/2015  . OSA on CPAP 05/20/2015  . Other specified hypotension   . NSTEMI (non-ST elevated myocardial infarction) (HCC) 05/19/2015  . Benign prostatic hyperplasia with urinary obstruction 04/24/2015  . Chronic LBP 12/05/2013  . Essential hypertension 09/04/2013    Orientation ACTIVITIES/SOCIAL BLADDER RESPIRATION    Self, Time, Situation, Place  Passive Continent Normal  BEHAVIORAL SYMPTOMS/MOOD NEUROLOGICAL BOWEL NUTRITION STATUS   (no behaviors)   Ileostomy Diet  PHYSICIAN VISITS COMMUNICATION OF NEEDS Height & Weight Skin    Verbally  (175.3 cm) 242 lbs. Surgical wounds (Stage 4 sacral pressure ulcer.  IR drain placed on 10/17/15.)          AMBULATORY STATUS RESPIRATION    Assist extensive Normal      Personal Care Assistance Level of Assistance  Bathing, Dressing Bathing Assistance: Maximum assistance   Dressing Assistance: Maximum assistance      Functional Limitations Info  Sight Sight Info: Adequate Hearing Info: Adequate Speech Info: Adequate       SPECIAL CARE FACTORS FREQUENCY  PT (By licensed PT), OT (By licensed OT)     PT Frequency: 5 x wk OT Frequency: 5 x wk  Additional Factors Info  Code Status, Allergies Code Status Info: Full Code             Current Medications (10/20/2015):  This is the current hospital active medication list Current Facility-Administered Medications  Medication Dose Route Frequency Provider Last Rate Last Dose  . acetaminophen (TYLENOL) tablet 650 mg  650 mg Oral Q6H PRN Ron ParkerHarvette C Jenkins, MD       Or  . acetaminophen (TYLENOL) suppository 650 mg  650 mg Rectal Q6H PRN Ron ParkerHarvette C Jenkins, MD   650 mg at 10/18/15 0222   . alum & mag hydroxide-simeth (MAALOX/MYLANTA) 200-200-20 MG/5ML suspension 30 mL  30 mL Oral Q6H PRN Ron ParkerHarvette C Jenkins, MD      . antiseptic oral rinse (BIOTENE) solution 15 mL  15 mL Mouth Rinse TID PC Harvette Velora Heckler Jenkins, MD   15 mL at 10/19/15 1800  . ceFEPIme (MAXIPIME) 2 g in dextrose 5 % 50 mL IVPB  2 g Intravenous 3 times per day Lorenza EvangelistElizabeth R Green, RPH   2 g at 10/20/15 0547  . feeding supplement (PRO-STAT SUGAR FREE 64) liquid 30 mL  30 mL Oral Daily Tilda FrancoLindsey Baker, RD      . hydrocortisone sodium succinate (SOLU-CORTEF) 100 MG injection 50 mg  50 mg Intravenous 4 times per day Alyson ReedyWesam G Yacoub, MD   50 mg at 10/20/15 0545  . HYDROmorphone (DILAUDID) injection 0.5-1 mg  0.5-1 mg Intravenous Q3H PRN Ron ParkerHarvette C Jenkins, MD      . magnesium oxide (MAG-OX) tablet 800 mg  800 mg Oral BID Ron ParkerHarvette C Jenkins, MD   800 mg at 10/19/15 2237  . metroNIDAZOLE (FLAGYL) IVPB 500 mg  500 mg Intravenous Q8H Ron ParkerHarvette C Jenkins, MD   500 mg at 10/20/15 0944  . multivitamin with minerals tablet 1 tablet  1 tablet Oral Daily Ron ParkerHarvette C Jenkins, MD   1 tablet at 10/19/15 0930  . nutrition supplement (JUVEN) (JUVEN) powder packet 1 packet  1 packet Oral BID BM Tilda FrancoLindsey Baker, RD   1 packet at 10/19/15 2032  . ondansetron (ZOFRAN) tablet 4 mg  4 mg Oral Q6H PRN Ron ParkerHarvette C Jenkins, MD       Or  . ondansetron (ZOFRAN) injection 4 mg  4 mg Intravenous Q6H PRN Ron ParkerHarvette C Jenkins, MD      . oxyCODONE (Oxy IR/ROXICODONE) immediate release tablet 5 mg  5 mg Oral Q4H PRN Ron ParkerHarvette C Jenkins, MD   5 mg at 10/18/15 1344  . pantoprazole (PROTONIX) EC tablet 40 mg  40 mg Oral Daily Ron ParkerHarvette C Jenkins, MD   40 mg at 10/19/15 0930  . potassium chloride (K-DUR,KLOR-CON) CR tablet 30 mEq  30 mEq Oral BID Ron ParkerHarvette C Jenkins, MD   30 mEq at 10/19/15 2238  . vancomycin (VANCOCIN) IVPB 1000 mg/200 mL premix  1,000 mg Intravenous Q12H Candis SchatzBenjamin G Mancheril, RPH   1,000 mg at 10/20/15 [id number]     Discharge Medications: Please see  discharge summary for a list of discharge medications.  Relevant Imaging Results:  Relevant Lab Results:  Recent Labs    Additional Information Pt uses CPAP.   SS # [id number]  Pattiann Solanki, Dickey GaveJamie Lee, LCSW

## 2015-10-20 NOTE — Progress Notes (Signed)
ANTICOAGULATION CONSULT NOTE - follow up  Pharmacy Consult for warfarin Indication: DVT  Allergies  Allergen Reactions  . Unasyn [Ampicillin-Sulbactam Sodium] Hives, Itching and Rash    Diffuse rash not responding to Pepcid and Benadryl.  TDD.  Marland Kitchen. Levaquin [Levofloxacin] Nausea Only  . Penicillins Hives  . Doxycycline Rash    Patient Measurements: Height: 5\' 9"  (175.3 cm) Weight: 242 lb 1 oz (109.8 kg) IBW/kg (Calculated) : 70.7  Vital Signs: Temp: 97.4 F (36.3 C) (12/06 0803) Temp Source: Axillary (12/06 0803) BP: 135/62 mmHg (12/06 1000) Pulse Rate: 73 (12/05 2231)  Labs:  Recent Labs  10/18/15 0246 10/18/15 1200 10/19/15 0350 10/19/15 1115 10/20/15 0330  HGB 7.9*  --  8.0*  --  8.4*  HCT 24.5*  --  25.8*  --  27.5*  PLT 332  --  388  --  475*  LABPROT  --  27.6*  --  28.4* 28.2*  INR  --  2.61*  --  2.72* 2.70*  CREATININE 0.41*  --  0.46*  --  0.38*    Estimated Creatinine Clearance: 104.9 mL/min (by C-G formula based on Cr of 0.38).   Medical History: Past Medical History  Diagnosis Date  . Anxiety   . Hypertension   . Arthritis   . Sleep apnea     wears CPAP nightly  . Glaucoma   . CHF (congestive heart failure) (HCC)     MI, with open heart surgery x 30 sdays ago per pt. (06/26/15)  . S/P CABG (coronary artery bypass graft)   . MI (myocardial infarction) (HCC)     Medications:  Scheduled:  . antiseptic oral rinse  15 mL Mouth Rinse TID PC  . ceFEPime (MAXIPIME) IV  2 g Intravenous 3 times per day  . feeding supplement (PRO-STAT SUGAR FREE 64)  30 mL Oral Daily  . hydrocortisone sodium succinate  50 mg Intravenous 4 times per day  . magnesium oxide  800 mg Oral BID  . metronidazole  500 mg Intravenous Q8H  . multivitamin with minerals  1 tablet Oral Daily  . nutrition supplement (JUVEN)  1 packet Oral BID BM  . pantoprazole  40 mg Oral Daily  . potassium chloride  30 mEq Oral BID  . vancomycin  1,000 mg Intravenous Q12H   Infusions:     Assessment: Patient known to pharmacy for dosing of antibiotics. Patient was on warfarin prior to admission at a dose of 5mg  q6pm for hx DVT was on hold at admission but now to restart warfarin per pharmacy dosing  Today, 10/20/2015  INR therapeutic despite no warfarin dose since 12/2 (Note that warfarin was intended to be given 12/5 but was not ordered and INR did not budge regardless)  Hgb low but stable, Plt stable  No reported bleeding  Drug interactions: Flagyl  Goal of Therapy:  INR 2-3   Plan:  1) Only warfarin 1mg  tonight 2) Daily INR   Hessie KnowsJustin M Sybil Shrader, PharmD, BCPS Pager [phone] 10/20/2015 10:21 AM

## 2015-10-20 NOTE — Progress Notes (Signed)
Pharmacy Antibiotic Note  Ardyth HarpsHarold D Ridgeway is a 69 y.o. year-old male admitted on 10/16/2015.  Earlier today, Vancomycin, Cefepime & Metronidazole were d/c'ed and patient placed on Ceftriaxone 2gm IV q24h.  ID consulted who recommended continuing Ceftriaxone and adding back the Metronidazole and Vancomycin.  Vancomycin was continued for GPC in pairs seen on gram stain.  IAI with multiple abdominal abscesses with positive Ecoli culture  Assessment: Today is Day #4 Vancomycin therefore, Vancomycin trough level was ordered to be drawn prior to 20:00 dose today  Vanc trough = 17 mcg/ml (Goal 15-20)   Plan:  Continue Ceftriaxone & Metronidazole per MD   Continue Vancomycin 1gm IV q12h  Temp (24hrs), Avg:97.9 F (36.6 C), Min:97.4 F (36.3 C), Max:98.7 F (37.1 C)   Recent Labs Lab 10/16/15 1718 10/17/15 0348 10/18/15 0246 10/19/15 0350 10/20/15 0330  WBC 16.9* 11.6* 7.2 5.3 9.4     Recent Labs Lab 10/16/15 1718 10/17/15 0348 10/18/15 0246 10/19/15 0350 10/20/15 0330  CREATININE 0.66 0.40* 0.41* 0.46* 0.38*   Estimated Creatinine Clearance: 104.9 mL/min (by C-G formula based on Cr of 0.38).   Antimicrobial allergies: Unasyn, PCN and doxy  12/3 >> rocephin >> 12/4 12/3 >> flagyl >>  12/3 >> vancomycin >> 12/4 >> cefepime >>12/6 12/6 >> ceftriaxone >>   Levels/dose changes this admission: 12/6 @ 19:00 Vanc Trough = 17 mcg/ml - cont regimen  12/2 blood:NGTD 12/2 urine: multiple species present 12/3 peritoneal abscess: abundant GNR - Ecoli - pansensitive 12/3 Gram Stain: GPC in pairs/GNR  Maryellen Pileoindexter, Michiah Masse Trefz PharmD 10/20/2015 7:43 PM

## 2015-10-20 NOTE — Clinical Social Work Note (Signed)
Clinical Social Work Assessment  Patient Details  Name: Jonathan Le MRN: [id number] Date of Birth: 05/20/1946  Date of referral:  10/20/15               Reason for consult:  Facility Placement, Discharge Planning                Permission sought to share information with:  Facility Contact Representative Permission granted to share information::  Yes, Verbal Permission Granted  Name::        Agency::     Relationship::     Contact Information:     Housing/Transportation Living arrangements for the past 2 months:  Skilled Nursing Facility, Single Family Home (CIR) Source of Information:  Patient, Facility Patient Interpreter Needed:  None Criminal Activity/Legal Involvement Pertinent to Current Situation/Hospitalization:  No - Comment as needed Significant Relationships:  Spouse, Siblings Lives with:  Spouse Do you feel safe going back to the place where you live?   (SNF recommended) Need for family participation in patient care:     Care giving concerns:  Pt isn't sure is care can be managed at ome following hospital d/c. Pt is concerned [facility] may not have an opening for him since his bed there is not being held. Medicare coverage for SNF placement reviewed with pt.   Social Worker assessment / plan:  Pt hospitalized on 10/16/15 with abdominal abscess and sepsis. Pt is from [facility] . He was admitted there from CIR on 09/25/15 for continued therapy. CSW met with pt to assist with d/c planning. Pt feels he may need continued SNF placement at d/c. PT has recommended this plan. [facility] has been contacted and SNF will readmit pt pending bed availability. CSW has initiated new SNF search in case [facility] has no openings at time of d/c. Bed offers are pending.  Employment status:  Retired Insurance information:  Medicare PT Recommendations:  Skilled Nursing Facility Information / Referral to community resources:  Skilled Nursing Facility  Patient/Family's Response to  care:  Pt feels continued SNF placement may be needed at d/c.  Patient/Family's Understanding of and Emotional Response to Diagnosis, Current Treatment, and Prognosis:  Pt is aware he will be transferred to telemetry unit today. Pt is hopeful that [facility] will have an opening for him at d/c. CSW provided reassurance that [facility] will be kept updated on his medical status. Pt gave CSW permission to initiate new SNF search, in case additional options are needed.  Emotional Assessment Appearance:  Appears stated age Attitude/Demeanor/Rapport:  Other (cooperative) Affect (typically observed):  Pleasant, Calm Orientation:  Oriented to Self, Oriented to Place, Oriented to  Time, Oriented to Situation Alcohol / Substance use:  Not Applicable Psych involvement (Current and /or in the community):  No (Comment)  Discharge Needs  Concerns to be addressed:  Discharge Planning Concerns Readmission within the last 30 days:  No Current discharge risk:  None Barriers to Discharge:  No Barriers Identified   ,  Lee, LCSW  [phone] 10/20/2015, 11:56 AM  

## 2015-10-20 NOTE — Progress Notes (Signed)
Patient refused dressing change on sacral ulcer. RN explained why routine dressing changes were important but the patient still refused.

## 2015-10-20 NOTE — Clinical Social Work Placement (Signed)
   CLINICAL SOCIAL WORK PLACEMENT  NOTE  Date:  10/20/2015  Patient Details  Name: Jonathan Le MRN: [id number] Date of Birth: 05/15/1946  Clinical Social Work is seeking post-discharge placement for this patient at the Skilled  Nursing Facility level of care (*CSW will initial, date and re-position this form in  chart as items are completed):  No   Patient/family provided with [facility] Clinical Social Work Department's list of facilities offering this level of care within the geographic area requested by the patient (or if unable, by the patient's family).  Yes   Patient/family informed of their freedom to choose among providers that offer the needed level of care, that participate in Medicare, Medicaid or managed care program needed by the patient, have an available bed and are willing to accept the patient.  No   Patient/family informed of [facility] ownership interest in [facility] and [facility], as well as of the fact that they are under no obligation to receive care at these facilities.  PASRR submitted to EDS on       PASRR number received on       Existing PASRR number confirmed on 10/20/15     FL2 transmitted to all facilities in geographic area requested by pt/family on 10/20/15     FL2 transmitted to all facilities within larger geographic area on       Patient informed that his/her managed care company has contracts with or will negotiate with certain facilities, including the following:        No   Patient/family informed of bed offers received.  Patient chooses bed at       Physician recommends and patient chooses bed at      Patient to be transferred to   on  .  Patient to be transferred to facility by       Patient family notified on   of transfer.  Name of family member notified:        PHYSICIAN       Additional Comment:    _______________________________________________ Vennie HomansHaidinger, Darrol Brandenburg Lee, LCSW  [phone] 10/20/2015, 12:38  PM

## 2015-10-21 DIAGNOSIS — Z978 Presence of other specified devices: Secondary | ICD-10-CM

## 2015-10-21 DIAGNOSIS — E43 Unspecified severe protein-calorie malnutrition: Secondary | ICD-10-CM

## 2015-10-21 LAB — PROTIME-INR
INR: 2.23 — ABNORMAL HIGH (ref 0.00–1.49)
Prothrombin Time: 24.5 seconds — ABNORMAL HIGH (ref 11.6–15.2)

## 2015-10-21 LAB — CULTURE, BLOOD (ROUTINE X 2)
Culture: NO GROWTH
Culture: NO GROWTH

## 2015-10-21 MED ORDER — UNJURY CHICKEN SOUP POWDER: 2.0000 [oz_av] | Freq: Three times a day (TID) | ORAL | Status: DC

## 2015-10-21 MED ORDER — UNJURY CHICKEN SOUP POWDER
2.0000 [oz_av] | Freq: Four times a day (QID) | ORAL | Status: DC
  Administered 2015-10-21 – 2015-10-29 (×25): 2 [oz_av] via ORAL
  Filled 2015-10-21 (×40): qty 27

## 2015-10-21 MED ORDER — WARFARIN SODIUM 2.5 MG PO TABS
2.5000 mg | ORAL_TABLET | Freq: Once | ORAL | Status: AC
  Administered 2015-10-21: 2.5 mg via ORAL
  Filled 2015-10-21: qty 1

## 2015-10-21 MED ORDER — FUROSEMIDE 40 MG PO TABS
40.0000 mg | ORAL_TABLET | Freq: Every day | ORAL | Status: DC
  Administered 2015-10-22 – 2015-10-30 (×9): 40 mg via ORAL
  Filled 2015-10-21 (×11): qty 1

## 2015-10-21 MED ORDER — FUROSEMIDE 10 MG/ML IJ SOLN
40.0000 mg | Freq: Once | INTRAMUSCULAR | Status: AC
  Administered 2015-10-21: 40 mg via INTRAVENOUS
  Filled 2015-10-21: qty 4

## 2015-10-21 NOTE — Progress Notes (Signed)
[facility] Progress Note     Subjective: Pt denies much pain.  Says his drain isn't draining much.  His ostomy pouch is full.  Good flatus and BM daily.  Needs help emptying pouch.  Tolerating diet, but needs more protein to help him heal.    Objective: Vital signs in last 24 hours: Temp:  [97.6 F (36.4 C)-98.3 F (36.8 C)] 97.7 F (36.5 C) (12/07 0628) Pulse Rate:  [71-80] 71 (12/07 0628) Resp:  [15-18] 16 (12/07 0628) BP: (102-135)/(62-80) 120/77 mmHg (12/07 0628) SpO2:  [96 %-98 %] 96 % (12/07 0628) Weight:  [113.7 kg (250 lb 10.6 oz)] 113.7 kg (250 lb 10.6 oz) (12/07 0628) Last BM Date: 10/20/15  Intake/Output from previous day: 12/06 0701 - 12/07 0700 In: 200 [IV Piggyback:200] Out: 915 [Urine:675; Drains:90; Stool:150] Intake/Output this shift:    PE: Gen:  Alert, NAD, pleasant Abd: Obese, soft, NT/ND, +BS, midline wound healed, drain in middle of abdomen with frankly tan purulent drainage, foul smelling drainage, ostomy patent with stool and flatus.   Lab Results:   Recent Labs  10/19/15 0350 10/20/15 0330  WBC 5.3 9.4  HGB 8.0* 8.4*  HCT 25.8* 27.5*  PLT 388 475*   BMET  Recent Labs  10/19/15 0350 10/20/15 0330  NA 138 133*  K 4.2 4.8  CL 113* 110  CO2 20* 20*  GLUCOSE 171* 163*  BUN 11 17  CREATININE 0.46* 0.38*  CALCIUM 8.6* 8.7*   PT/INR  Recent Labs  10/19/15 1115 10/20/15 0330  LABPROT 28.4* 28.2*  INR 2.72* 2.70*   CMP     Component Value Date/Time   NA 133* 10/20/2015 0330   K 4.8 10/20/2015 0330   CL 110 10/20/2015 0330   CO2 20* 10/20/2015 0330   GLUCOSE 163* 10/20/2015 0330   BUN 17 10/20/2015 0330   CREATININE 0.38* 10/20/2015 0330   CALCIUM 8.7* 10/20/2015 0330   PROT 5.7* 10/20/2015 0330   ALBUMIN 2.0* 10/20/2015 0330   AST 13* 10/20/2015 0330   ALT 14* 10/20/2015 0330   ALKPHOS 102 10/20/2015 0330   BILITOT 0.2* 10/20/2015 0330   GFRNONAA >60 10/20/2015 0330   GFRAA >60 10/20/2015 0330   Lipase      Component Value Date/Time   LIPASE 18 10/16/2015 1718       Studies/Results: No results found.  Anti-infectives: Anti-infectives    Start     Dose/Rate Route Frequency Ordered Stop   10/20/15 2000  vancomycin (VANCOCIN) IVPB 1000 mg/200 mL premix     1,000 mg 200 mL/hr over 60 Minutes Intravenous Every 12 hours 10/20/15 1606     10/20/15 1630  metroNIDAZOLE (FLAGYL) IVPB 500 mg    Comments:  Pharmacy may adjust   500 mg 100 mL/hr over 60 Minutes Intravenous Every 8 hours 10/20/15 1541     10/20/15 1330  cefTRIAXone (ROCEPHIN) 2 g in dextrose 5 % 50 mL IVPB     2 g 100 mL/hr over 30 Minutes Intravenous Every 24 hours 10/20/15 1304     10/18/15 0400  ceFEPIme (MAXIPIME) 2 g in dextrose 5 % 50 mL IVPB  Status:  Discontinued     2 g 100 mL/hr over 30 Minutes Intravenous 3 times per day 10/18/15 0314 10/20/15 1304   10/17/15 1730  cefTRIAXone (ROCEPHIN) 1 g in dextrose 5 % 50 mL IVPB  Status:  Discontinued     1 g 100 mL/hr over 30 Minutes Intravenous Every 24 hours 10/16/15 2146 10/17/15 0008  10/17/15 1000  cefTRIAXone (ROCEPHIN) 2 g in dextrose 5 % 50 mL IVPB  Status:  Discontinued     2 g 100 mL/hr over 30 Minutes Intravenous Every 24 hours 10/17/15 0008 10/18/15 0236   10/17/15 0800  vancomycin (VANCOCIN) IVPB 1000 mg/200 mL premix  Status:  Discontinued     1,000 mg 200 mL/hr over 60 Minutes Intravenous Every 12 hours 10/16/15 1932 10/20/15 1303   10/17/15 0130  metroNIDAZOLE (FLAGYL) IVPB 500 mg  Status:  Discontinued     500 mg 100 mL/hr over 60 Minutes Intravenous Every 8 hours 10/16/15 2147 10/20/15 1304   10/16/15 1730  metroNIDAZOLE (FLAGYL) IVPB 500 mg     500 mg 100 mL/hr over 60 Minutes Intravenous  Once 10/16/15 1721 10/16/15 1923   10/16/15 1730  cefTRIAXone (ROCEPHIN) 1 g in dextrose 5 % 50 mL IVPB     1 g 100 mL/hr over 30 Minutes Intravenous  Once 10/16/15 1721 10/16/15 1902   10/16/15 1730  vancomycin (VANCOCIN) 2,000 mg in sodium chloride 0.9 % 500  mL IVPB     2,000 mg 250 mL/hr over 120 Minutes Intravenous  Once 10/16/15 1727 10/16/15 2222       Assessment/Plan 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 Dwain Sarna, closure of abdominal wall, wound vac and ileostomy - 06/20/15, Dr. Donell Beers; after CABG 05/22/15, Dr. Kathlee Nations Trigt. S/p hospitalized 10/13-11/11/16 for intraabdominal abscess drainage, possible stump breakdown. IR drain placement 10/17/15 -Cx shows Gram + cocci in pairs and chains, moderate gram neg pairs (abundant e.coli sensitive to rocephin) -Monitor drain output - still frankly purulent, drain care orders re-ordered -Dietitian following - did make adjustments in protein supplements due to patient preference -IV antibiotics  -When drainage diminishes would recommend repeat CT with drain study and gastrografin enema to see if rectum/stump is affected -Mobilize frequently to aid in recovery -Ostomy care ordered  OSA with CPAP Sacral decubitus - currently clean and looks good - will be eligible for flap when his prealbumin is >16 per Dr. Kelly Splinter Chronic respiratory failure - resolved  Chronic systolic CHF (EF 16%)  Hypertension DVT: on coumadin (INR 2.70 today, no coumadin since admission that I can see.) Atrial fibrillation Malnutrition - Prealbumin 6.1 10/20/15 - he does not like Prostat (will d/c this), will add UNJURY TID, he can also take in JUVEN which he likes Debility  Antibiotics: Vancomycin Day 5, Flagyl Day 5, now on Rocephin day 2 (Day 5 of cephalosporins), may be able to d/c vanco, but gram stain did show gram + cocci in pairs/chains      LOS: 5 days    Nonie Hoyer 10/21/2015, 9:40 AM Pager: [phone]

## 2015-10-21 NOTE — Progress Notes (Signed)
Physical Therapy Treatment Patient Details Name: Jonathan Le MRN: [id number] DOB: 04/29/1946 Today's Date: 10/21/2015    History of Present Illness Jonathan Le is a 69 y.o. male with a history of CAD S/P CABG, CHF, HTN, Hx DVT n, OSA, and Glaucoma with a complex recent surgical history who was sent from the [facility] 10/16/15  due to fevers and chills for the last 2 weeks . CT scan which revealed a large abscess 16 x 10 x 21 cm and he was sent to the ED for admission and treatment. prior history:.After CABG in 7/16,Patient has been in LTACH, CIR, and lastly at [facility].. Has a sacral dcubitus thet was treated in past with PLS and VAC attempts per patient. Has a colostomy and L drain from abdomen.    PT Comments    Patient is putting forth efforts, did partially stand from bed with much effort and assistance. Patient c/o buttocks being  painful when supine. Does not appear to be on an  Air mattress/low pressure. RN aware.  Follow Up Recommendations  SNF;Supervision/Assistance - 24 hour     Equipment Recommendations  None recommended by PT    Recommendations for Other Services       Precautions / Restrictions Precautions Precautions: Fall Precaution Comments: sacral decub, drain lines from abdomen; knee pain, colostomy Other Brace/Splint: hinged knee brace on the RLE for comfort per PT note from previous admit- not present Restrictions Weight Bearing Restrictions: No    Mobility  Bed Mobility     Rolling: Mod assist         General bed mobility comments: utilized pad. Rolled to bil sides; light mod A.  repositioned onto R side at end of session  Transfers Overall transfer level: Needs assistance   Transfers: Sit to/from Stand Sit to Stand: Total assist;From elevated surface         General transfer comment: partially stood with max A +2 and use of recliner in front of pt.  He was unable to stand erect but cleared buttocks off of bed. Bed was  elevated for sit to stand  Ambulation/Gait                 Stairs            Wheelchair Mobility    Modified Rankin (Stroke Patients Only)       Balance     Sitting balance-Leahy Scale: Fair Sitting balance - Comments: sat eob for 10 minutes while performing exercises                            Cognition Arousal/Alertness: Awake/alert Behavior During Therapy: WFL for tasks assessed/performed Overall Cognitive Status: Within Functional Limits for tasks assessed                      Exercises General Exercises - Lower Extremity Long Arc Quad: AROM;Both;10 reps;Seated Hip ABduction/ADduction: AROM;Both;10 reps;Seated Hip Flexion/Marching: AAROM;Both;10 reps;Seated Other Exercises Other Exercises: used theraband, level one for horizontal abduction and elbow flexion, bil.  Pt is unable to use for shoulder flexion--educated to perform AROM for this Other Exercises: resisted knee flexion x 10 in sitting with Theraband.    General Comments        Pertinent Vitals/Pain Pain Assessment: Faces Faces Pain Scale: Hurts even more Pain Location: buttock, r knee Pain Descriptors / Indicators: Discomfort Pain Intervention(s): Repositioned;Limited activity within patient's tolerance;Monitored during session  Home Living                      Prior Function            PT Goals (current goals can now be found in the care plan section) Progress towards PT goals: Progressing toward goals    Frequency  Min 2X/week    PT Plan Current plan remains appropriate    Co-evaluation PT/OT/SLP Co-Evaluation/Treatment: Yes Reason for Co-Treatment: Complexity of the patient's impairments (multi-system involvement);For patient/therapist safety PT goals addressed during session: Mobility/safety with mobility OT goals addressed during session: Strengthening/ROM     End of Session   Activity Tolerance: Patient tolerated treatment well Patient  left: in bed;with call bell/phone within reach;with bed alarm set     Time: [id number] PT Time Calculation (min) (ACUTE ONLY): 43 min  Charges:  $Therapeutic Exercise: 8-22 mins $Therapeutic Activity: 8-22 mins                    G Codes:      Rada HayHill, Daelan Gatt Elizabeth 10/21/2015, 4:33 PM Blanchard KelchKaren Cabe Lashley PT [phone]

## 2015-10-21 NOTE — Progress Notes (Signed)
Occupational Therapy Treatment Patient Details Name: Jonathan Le MRN: [id number] DOB: 05/18/1946 Today's Date: 10/21/2015    History of present illness Jonathan Le is a 69 y.o. male with a history of CAD S/P CABG, CHF, HTN, Hx DVT n, OSA, and Glaucoma with a complex recent surgical history who was [facility]  due to fevers and chills for the last 2 weeks . CT scan which revealed a large abscess 16 x 10 x 21 cm and he was sent to the ED for admission and treatment. prior history:.After CABG in 7/16,Patient has been in LTACH, CIR, and lastly at [facility].. Has a sacral dcubitus thet was treated in past with PLS and VAC attempts per patient. Has a colostomy and L drain from abdomen.   OT comments  Pt is very motivated.  Needs reinforcement with UE strengthening program.  Sat EOB and worked on trying to stand.    Follow Up Recommendations  SNF    Equipment Recommendations   (defer to SNF)    Recommendations for Other Services      Precautions / Restrictions Precautions Precautions: Fall Precaution Comments: sacral decub, drain lines from abdomen; knee pain, colostomy Restrictions Weight Bearing Restrictions: No       Mobility Bed Mobility     Rolling: Mod assist         General bed mobility comments: utilized pad. Rolled to bil sides; light mod A.  repositioned onto R side at end of session  Transfers                 General transfer comment: partially stood with max A +2 and use of recliner in front of pt.  He was unable to stand erect but cleared buttocks off of bed. Bed was elevated for sit to stand. Pt performed scoot (lifting buttocks up) with max A +[facility]    Balance     Sitting balance-Leahy Scale: Fair Sitting balance - Comments: sat eob for 10 minutes while performing exercises                           ADL                                                Vision                      Perception     Praxis      Cognition   Behavior During Therapy: WFL for tasks assessed/performed Overall Cognitive Status: Within Functional Limits for tasks assessed                       Extremity/Trunk Assessment               Exercises Other Exercises Other Exercises: used theraband, level one for horizontal abduction and elbow flexion, bil.  Pt is unable to use for shoulder flexion--educated to perform AROM for this   Shoulder Instructions       General Comments      Pertinent Vitals/ Pain       Pain Assessment: Faces Faces Pain Scale: Hurts even more Pain Location: buttocks Pain Intervention(s): Limited activity within patient's tolerance;Monitored during session;Premedicated before session;Repositioned  Home Living  Prior Functioning/Environment              Frequency Min 2X/week     Progress Toward Goals  OT Goals(current goals can now be found in the care plan section)  Progress towards OT goals: Progressing toward goals (goals updated)  ADL Goals Additional ADL Goal #2: pt will perform go from sit to stand with mod A  +2 from elevated surface holding onto eva walker for adls  Plan      Co-evaluation    PT/OT/SLP Co-Evaluation/Treatment: Yes   PT goals addressed during session: Mobility/safety with mobility OT goals addressed during session: Strengthening/ROM      End of Session     Activity Tolerance Patient tolerated treatment well   Patient Left in bed;with call bell/phone within reach;with bed alarm set   Nurse Communication          Time: 1610-9604 OT Time Calculation (min): 41 min  Charges: OT General Charges $OT Visit: 1 Procedure OT Treatments $Therapeutic Activity: 8-22 mins  Jonathan Le 10/21/2015, 3:20 PM   Marica Otter, OTR/L [phone] 10/21/2015

## 2015-10-21 NOTE — Progress Notes (Signed)
[facility]   Reason for visit: Follow up on intra-abdominal abscess  Interval History: he feels better, particularly after receiving lasix this am.  No abdominal pain. Drainage still from JP. No fever, no chills.  Eating.  Continues on antibiotics.  No new cultures.   Physical Exam: Constitutional:  Filed Vitals:   10/21/15 0628 10/21/15 1532  BP: 120/77 101/47  Pulse: 71 82  Temp: 97.7 F (36.5 C) 97.4 F (36.3 C)  Resp: 16 20   patient appears in NAD Respiratory: Normal respiratory effort; CTA B Cardiovascular: RRR  Review of Systems: Constitutional: negative for chills and malaise Gastrointestinal: negative for nausea and diarrhea  Lab Results  Component Value Date   WBC 9.4 10/20/2015   HGB 8.4* 10/20/2015   HCT 27.5* 10/20/2015   MCV 85.9 10/20/2015   PLT 475* 10/20/2015    Lab Results  Component Value Date   CREATININE 0.38* 10/20/2015   BUN 17 10/20/2015   NA 133* 10/20/2015   K 4.8 10/20/2015   CL 110 10/20/2015   CO2 20* 10/20/2015    Lab Results  Component Value Date   ALT 14* 10/20/2015   AST 13* 10/20/2015   ALKPHOS 102 10/20/2015     Microbiology: Recent Results (from the past 240 hour(s))  Blood Culture (routine x 2)     Status: None   Collection Time: 10/16/15  5:18 PM  Result Value Ref Range Status   Specimen Description BLOOD RIGHT ARM  Final   Special Requests BOTTLES DRAWN AEROBIC AND ANAEROBIC  Final   Culture NO GROWTH 5 DAYS  Final   Report Status 10/21/2015 FINAL  Final  Blood Culture (routine x 2)     Status: None   Collection Time: 10/16/15  5:53 PM  Result Value Ref Range Status   Specimen Description BLOOD RIGHT HAND  Final   Special Requests BOTTLES DRAWN AEROBIC AND ANAEROBIC  Final   Culture NO GROWTH 5 DAYS  Final   Report Status 10/21/2015 FINAL  Final  Urine culture     Status: None   Collection Time: 10/16/15  6:40 PM  Result Value Ref Range Status   Specimen Description  URINE, CLEAN CATCH  Final   Special Requests NONE  Final   Culture MULTIPLE SPECIES PRESENT, SUGGEST RECOLLECTION  Final   Report Status 10/18/2015 FINAL  Final  Culture, routine-abscess     Status: None   Collection Time: 10/17/15  3:20 PM  Result Value Ref Range Status   Specimen Description PERITONEAL ABSCESS  Final   Special Requests NONE  Final   Gram Stain   Final    ABUNDANT WBC PRESENT, PREDOMINANTLY PMN NO SQUAMOUS EPITHELIAL CELLS SEEN ABUNDANT GRAM POSITIVE COCCI IN PAIRS IN CHAINS MODERATE GRAM NEGATIVE RODS Performed at [facility]    Culture   Final    ABUNDANT ESCHERICHIA COLI Performed at [facility]    Report Status 10/20/2015 FINAL  Final   Organism ID, Bacteria ESCHERICHIA COLI  Final      Susceptibility   Escherichia coli - MIC*    AMPICILLIN <=2 SENSITIVE Sensitive     AMPICILLIN/SULBACTAM <=2 SENSITIVE Sensitive     CEFAZOLIN <=4 SENSITIVE Sensitive     CEFEPIME <=1 SENSITIVE Sensitive     CEFTAZIDIME <=1 SENSITIVE Sensitive     CEFTRIAXONE <=1 SENSITIVE Sensitive     CIPROFLOXACIN <=0.25 SENSITIVE Sensitive     GENTAMICIN <=1 SENSITIVE Sensitive  IMIPENEM <=0.25 SENSITIVE Sensitive     PIP/TAZO <=4 SENSITIVE Sensitive     TOBRAMYCIN <=1 SENSITIVE Sensitive     TRIMETH/SULFA Value in next row Sensitive      <=20 SENSITIVE(NOTE)    * ABUNDANT ESCHERICHIA COLI  Anaerobic culture     Status: None (Preliminary result)   Collection Time: 10/17/15  3:20 PM  Result Value Ref Range Status   Specimen Description PERITONEAL ABSCESS  Final   Special Requests NONE  Final   Gram Stain   Final    ABUNDANT WBC PRESENT, PREDOMINANTLY PMN NO SQUAMOUS EPITHELIAL CELLS SEEN ABUNDANT GRAM POSITIVE COCCI IN PAIRS IN CHAINS MODERATE GRAM NEGATIVE RODS [facility]    Culture   Final    NO ANAEROBES ISOLATED; CULTURE IN PROGRESS FOR 5 DAYS [facility]formed at [facility]    Report Status PENDING  Incomplete  MRSA PCR  Screening     Status: None   Collection Time: 10/18/15  8:32 PM  Result Value Ref Range Status   MRSA by PCR NEGATIVE NEGATIVE Final    Comment:        The GeneXpert MRSA Assay (FDA approved for NASAL specimens only), is one component of a comprehensive MRSA colonization surveillance program. It is not intended to diagnose MRSA infection nor to guide or monitor treatment for MRSA infections.     Impression/Plan:  1. Intra-abdominal abscess - on ceftriaxone and flagyl along with vancomycin.  Likely mixed.  I have kept on the vancomycin for now since his gram stain is positive for abundant gram positive cocci in pairs and his history of Enterococcus.  No changes for now. Still with drainage and plan per surgery/IR for CT next week.  2. Anxiety - no issues today, not tearful.

## 2015-10-21 NOTE — Progress Notes (Signed)
[facility] PROGRESS NOTE  Jonathan Le:[id number] DOB: 1946/04/05 DOA: 10/16/2015 PCP: Elizabeth Palau, FNP   Brief narrative 69 year old male with medical history of CAD status post CABG in July 2016, CHF with EF of 30-35 %, hypertension and history of DVT on Coumadin, obstructive sleep apnea on CPAP, glaucoma, stage 4 sacral decubitus, development of ischemic colitis in August 2016 for which he underwent excoriated a laparotomy with total colectomy and ileostomy with prolonged intubation requiring tracheostomy, followed by a complicated course with multiple abdominal and pelvic abscesses that have been drained. His last CT scan from 09/18/2015 showed fluid collection to have resolved and drains were removed. Patient was then sent to [facility]. For the last 2 weeks he was having vague abdominal pain with occasional fever. He was found to have a UTI and was started on antibiotics. However for the past 2 days he was having low blood pressure. He was sent for outpatient CT scan of the abdomen which showed a large central abdominal abscess measuring 161021 cm and was sent to the ED. Patient was found to be in septic shock with fever of 102.36F, tachycardic, hypotensive (blood pressure 83/37 mmHg) and significant leukocytosis (16.9 K). Patient admitted to stepdown unit on empiric antibiotics. General surgery consulted . Patient had drainage of his abdominal abscess by IR on 12/3.   Assessment/Plan: Recurrent Intra-abdominal abscess with sepsis/septic shock -Sepsis now resolved. -CT drainage of the abdominal abscess by IR on 12/3.Marland Kitchen Continuous significant output from the drain. Cultures growing Escherichia coli which is pansensitive. Continued vancomycin given gram-positive cocci on Gram stain. Narrowed antibiotic to ceftriaxone and Flagyl. Appreciate ID recommendation. Total course of antibiotic duration per ID.  Patient was treated with Unasyn followed by Augmentin  for enterococcus on the abscess culture upon discharge in October 2016 (I think received total 1 month of treatment). -The source of abscess is not clear. As per surgery, when the drain output decreases they would like to do the following: #1 repeat CT scan to ensure complete drainage of the abscess #2 below drain study #3-dosed Gastrografin enema to make sure he does not have a small leak from the rectal stump.     CAD status post CABG with cardiomyopathy Patient had NSTEMI in July 2016 with cardiogenic shock . Three-vessel disease seen on cardiac cath with EF of 30-35% He underwent CABG. Currently has some volume overload. Off fluid. Will resume low-dose Lasix. Monitor strict I/O.   Abdominal pain Secondary to abscess. Improved. Continue when necessary pain meds  Stage IV sacral decubitus ulcer Appears unchanged. Wound care consult appreciated. As per surgery who had spoken with plastic surgeon Dr. Kelly Splinter he may qualify to get a surgical flap for the decubitus ulcer once nutritional status is satisfactory. This will have to be done at [facility].  Anemia of chronic disease Reportedly had some blood in his colostomy . Mild drop in hemoglobin but around baseline. INR therapeutic. Resumed Coumadin.  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 . H&H stable.  resumed coumadin.  OSA Continue nighttime CPAP  Protein calorie malnutrition Nutritionist consulted. Switched nutritional supplements   Oliguria Possibly with hypovolemia and sepsis.renal fn normal. Urine output improving.  Diet: regular ( per patient's insistance) DVT prophylaxis: Therapeutic INR  Code Status: Full code Family Communication: updated wife on the phone. Disposition Plan:  transfer to tele  . Needs skilled nursing facility for PT evaluation.    Consultants:  CCS  PCCM  IR  ID  Procedures:  CT drainage of abdominal abscess on 12/4  Antibiotics:  IV  vancomycin, Flagyl 12/2-  Cefepime 12/3--12/6  Rocephin 12/6---  HPI/Subjective: Seen and examined. Denies any symptoms. No overnight issues. Blood pressure stable.  Objective: Filed Vitals:   10/21/15 0218 10/21/15 0628  BP: 124/80 120/77  Pulse: 75 71  Temp: 97.7 F (36.5 C) 97.7 F (36.5 C)  Resp: 18 16    Intake/Output Summary (Last 24 hours) at 10/21/15 1156 Last data filed at 10/21/15 1610  Gross per 24 hour  Intake      0 ml  Output    735 ml  Net   -735 ml   Filed Weights   10/16/15 1704 10/19/15 0400 10/21/15 0628  Weight: 105.235 kg (232 lb) 109.8 kg (242 lb 1 oz) 113.7 kg (250 lb 10.6 oz)    Exam:   General: Elderly male not in distress  HEENT: Pallor present, moist mucosa, supple neck  Chest: Fair bilaterally, no rhonchi or wheeze  Cardiovascular: S1 and S2 regular, 3/6 systolic murmur, no rubs or gallop  GI: Soft, nondistended, nontender, abdominal drain in place with clean dressing with active drainage of purulent material , ileostomy bag in place with  clear stool, bowel sounds present  Musculoskeletal: Warm, trace  pitting edema bilaterally, stage IV sacral decubitus  CNS: Alert and oriented  Data Reviewed: Basic Metabolic Panel:  Recent Labs Lab 10/16/15 1718 10/17/15 0348 10/18/15 0246 10/19/15 0350 10/20/15 0330  NA 130* 135 136 138 133*  K 4.5 3.6 3.8 4.2 4.8  CL 100* 107 109 113* 110  CO2 21* 20* 21* 20* 20*  GLUCOSE 123* 108* 93 171* 163*  BUN CREATININE 0.66 0.40* 0.41* 0.46* 0.38*  CALCIUM 8.6* 8.4* 8.1* 8.6* 8.7*   Liver Function Tests:  Recent Labs Lab 10/16/15 1718 10/20/15 0330  AST 27 13*  ALT 29 14*  ALKPHOS 150* 102  BILITOT 0.5 0.2*  PROT 5.8* 5.7*  ALBUMIN 1.5* 2.0*    Recent Labs Lab 10/16/15 1718  LIPASE 18   No results for input(s): AMMONIA in the last 168 hours. CBC:  Recent Labs Lab 10/16/15 1718 10/17/15 0348 10/18/15 0246 10/19/15 0350 10/20/15 0330  WBC 16.9* 11.6*  7.2 5.3 9.4  NEUTROABS 14.0*  --   --   --   --   HGB 9.5* 8.7* 7.9* 8.0* 8.4*  HCT 28.8* 27.1* 24.5* 25.8* 27.5*  MCV 84.0 85.5 84.2 86.3 85.9  PLT 395 380 332 388 475*   Cardiac Enzymes: No results for input(s): CKTOTAL, CKMB, CKMBINDEX, TROPONINI in the last 168 hours. BNP (last 3 results)  Recent Labs  07/28/15 0500 08/02/15 0620 08/16/15 0701  BNP 453.8* 367.7* 528.3*    ProBNP (last 3 results) No results for input(s): PROBNP in the last 8760 hours.  CBG: No results for input(s): GLUCAP in the last 168 hours.  Recent Results (from the past 240 hour(s))  Blood Culture (routine x 2)     Status: None (Preliminary result)   Collection Time: 10/16/15  5:18 PM  Result Value Ref Range Status   Specimen Description BLOOD RIGHT ARM  Final   Special Requests BOTTLES DRAWN AEROBIC AND ANAEROBIC  Final   Culture NO GROWTH 4 DAYS  Final   Report Status PENDING  Incomplete  Blood Culture (routine x 2)     Status: None (Preliminary result)   Collection Time: 10/16/15  5:53 PM  Result Value Ref Range  Status   Specimen Description BLOOD RIGHT HAND  Final   Special Requests BOTTLES DRAWN AEROBIC AND ANAEROBIC  Final   Culture NO GROWTH 4 DAYS  Final   Report Status PENDING  Incomplete  Urine culture     Status: None   Collection Time: 10/16/15  6:40 PM  Result Value Ref Range Status   Specimen Description URINE, CLEAN CATCH  Final   Special Requests NONE  Final   Culture MULTIPLE SPECIES PRESENT, SUGGEST RECOLLECTION  Final   Report Status 10/18/2015 FINAL  Final  Culture, routine-abscess     Status: None   Collection Time: 10/17/15  3:20 PM  Result Value Ref Range Status   Specimen Description PERITONEAL ABSCESS  Final   Special Requests NONE  Final   Gram Stain   Final    ABUNDANT WBC PRESENT, PREDOMINANTLY PMN NO SQUAMOUS EPITHELIAL CELLS SEEN ABUNDANT GRAM POSITIVE COCCI IN PAIRS IN CHAINS MODERATE GRAM NEGATIVE RODS Performed at [facility]     Culture   Final    ABUNDANT ESCHERICHIA COLI Performed at [facility]    Report Status 10/20/2015 FINAL  Final   Organism ID, Bacteria ESCHERICHIA COLI  Final      Susceptibility   Escherichia coli - MIC*    AMPICILLIN <=2 SENSITIVE Sensitive     AMPICILLIN/SULBACTAM <=2 SENSITIVE Sensitive     CEFAZOLIN <=4 SENSITIVE Sensitive     CEFEPIME <=1 SENSITIVE Sensitive     CEFTAZIDIME <=1 SENSITIVE Sensitive     CEFTRIAXONE <=1 SENSITIVE Sensitive     CIPROFLOXACIN <=0.25 SENSITIVE Sensitive     GENTAMICIN <=1 SENSITIVE Sensitive     IMIPENEM <=0.25 SENSITIVE Sensitive     PIP/TAZO <=4 SENSITIVE Sensitive     TOBRAMYCIN <=1 SENSITIVE Sensitive     TRIMETH/SULFA Value in next row Sensitive      <=20 SENSITIVE(NOTE)    * ABUNDANT ESCHERICHIA COLI  Anaerobic culture     Status: None (Preliminary result)   Collection Time: 10/17/15  3:20 PM  Result Value Ref Range Status   Specimen Description PERITONEAL ABSCESS  Final   Special Requests NONE  Final   Gram Stain   Final    ABUNDANT WBC PRESENT, PREDOMINANTLY PMN NO SQUAMOUS EPITHELIAL CELLS SEEN ABUNDANT GRAM POSITIVE COCCI IN PAIRS IN CHAINS MODERATE GRAM NEGATIVE RODS Performed at [facility]    Culture   Final    NO ANAEROBES ISOLATED; CULTURE IN PROGRESS FOR 5 DAYS Performed at [facility]    Report Status PENDING  Incomplete  MRSA PCR Screening     Status: None   Collection Time: 10/18/15  8:32 PM  Result Value Ref Range Status   MRSA by PCR NEGATIVE NEGATIVE Final    Comment:        The GeneXpert MRSA Assay (FDA approved for NASAL specimens only), is one component of a comprehensive MRSA colonization surveillance program. It is not intended to diagnose MRSA infection nor to guide or monitor treatment for MRSA infections.      Studies: No results found.  Scheduled Meds: . antiseptic oral rinse  15 mL Mouth Rinse TID PC  . cefTRIAXone (ROCEPHIN)  IV  2 g Intravenous Q24H  .  hydrocortisone sodium succinate  50 mg Intravenous 4 times per day  . magnesium oxide  800 mg Oral BID  . metronidazole  500 mg Intravenous Q8H  . multivitamin with minerals  1 tablet Oral Daily  . nutrition supplement (JUVEN)  1 packet Oral BID BM  . pantoprazole  40 mg Oral Daily  . potassium chloride  30 mEq Oral BID  . protein supplement  2 oz Oral QID  . vancomycin  1,000 mg Intravenous Q12H  . Warfarin - Pharmacist Dosing Inpatient   Does not apply q1800   Continuous Infusions:      Time spent: 25 minutes    Kemiah Booz  [facility] Pager [phone]. If 7PM-7AM, please contact night-coverage at [email or web address], password [email or web address] 10/21/2015, 11:56 AM  LOS: 5 days

## 2015-10-21 NOTE — Progress Notes (Signed)
ANTICOAGULATION CONSULT NOTE - Follow Up  Pharmacy Consult for Warfarin Indication: DVT  Allergies  Allergen Reactions  . Unasyn [Ampicillin-Sulbactam Sodium] Hives, Itching and Rash    Diffuse rash not responding to Pepcid and Benadryl.  TDD.  Marland Kitchen. Levaquin [Levofloxacin] Nausea Only  . Penicillins Hives  . Doxycycline Rash    Patient Measurements: Height: 5\' 9"  (175.3 cm) Weight: 250 lb 10.6 oz (113.7 kg) IBW/kg (Calculated) : 70.7  Vital Signs: Temp: 97.7 F (36.5 C) (12/07 0628) Temp Source: Axillary (12/07 0628) BP: 120/77 mmHg (12/07 0628) Pulse Rate: 71 (12/07 0628)  Labs:  Recent Labs  10/19/15 0350 10/19/15 1115 10/20/15 0330 10/21/15 1114  HGB 8.0*  --  8.4*  --   HCT 25.8*  --  27.5*  --   PLT 388  --  475*  --   LABPROT  --  28.4* 28.2* 24.5*  INR  --  2.72* 2.70* 2.23*  CREATININE 0.46*  --  0.38*  --     Estimated Creatinine Clearance: 106.8 mL/min (by C-G formula based on Cr of 0.38).   Medications:  Scheduled:  . antiseptic oral rinse  15 mL Mouth Rinse TID PC  . cefTRIAXone (ROCEPHIN)  IV  2 g Intravenous Q24H  . hydrocortisone sodium succinate  50 mg Intravenous 4 times per day  . magnesium oxide  800 mg Oral BID  . metronidazole  500 mg Intravenous Q8H  . multivitamin with minerals  1 tablet Oral Daily  . nutrition supplement (JUVEN)  1 packet Oral BID BM  . pantoprazole  40 mg Oral Daily  . potassium chloride  30 mEq Oral BID  . protein supplement  2 oz Oral QID  . vancomycin  1,000 mg Intravenous Q12H  . Warfarin - Pharmacist Dosing Inpatient   Does not apply q1800   Infusions:    Assessment: Patient known to pharmacy for dosing of antibiotics. Patient was on warfarin prior to admission at a dose of 5mg  q6pm for hx DVT was on hold at admission but now to restart warfarin per pharmacy dosing  Today, 10/21/2015  INR therapeutic at 2.23 - warfarin resumed last PM  Hgb low but stable, Plt stable (12/6)  No reported bleeding  Drug  interactions: Flagyl  Goal of Therapy:  INR 2-3   Plan:   Warfarin 2.5mg  x 1 today  Daily PT/INR  Loralee PacasErin Anarosa Kubisiak, PharmD, BCPS Pager: [phone]  10/21/2015 12:15 PM

## 2015-10-22 ENCOUNTER — Inpatient Hospital Stay: Payer: Medicare Other | Admitting: Infectious Disease

## 2015-10-22 LAB — CBC
HEMATOCRIT: 29.3 % — AB (ref 39.0–52.0)
HEMOGLOBIN: 9 g/dL — AB (ref 13.0–17.0)
MCH: 26.3 pg (ref 26.0–34.0)
MCHC: 30.7 g/dL (ref 30.0–36.0)
MCV: 85.7 fL (ref 78.0–100.0)
Platelets: 476 10*3/uL — ABNORMAL HIGH (ref 150–400)
RBC: 3.42 MIL/uL — AB (ref 4.22–5.81)
RDW: 15 % (ref 11.5–15.5)
WBC: 13 10*3/uL — ABNORMAL HIGH (ref 4.0–10.5)

## 2015-10-22 LAB — PROTIME-INR
INR: 2.14 — AB (ref 0.00–1.49)
Prothrombin Time: 23.7 seconds — ABNORMAL HIGH (ref 11.6–15.2)

## 2015-10-22 LAB — ANAEROBIC CULTURE

## 2015-10-22 MED ORDER — WARFARIN SODIUM 2.5 MG PO TABS
2.5000 mg | ORAL_TABLET | Freq: Once | ORAL | Status: AC
  Administered 2015-10-22: 2.5 mg via ORAL
  Filled 2015-10-22: qty 1

## 2015-10-22 NOTE — Progress Notes (Signed)
CSW noted plans for possible surgery next week of abscess- [facility] updated and are hoping to take him back if SNF bed available at time of dc- CSW has also sent FL2 and clinicals to other area SNF's for possible SNF alternative options- will follow and assist  Reece LevyJanet Mykeal Carrick, MSW, [employer]CSWA  [phone]

## 2015-10-22 NOTE — Progress Notes (Signed)
Pt has home CPAP and will administer himself when ready for bed.  RT to monitor and assess as needed.

## 2015-10-22 NOTE — Progress Notes (Signed)
ANTICOAGULATION CONSULT NOTE - Follow Up  Pharmacy Consult for Warfarin Indication: DVT  Allergies  Allergen Reactions  . Unasyn [Ampicillin-Sulbactam Sodium] Hives, Itching and Rash    Diffuse rash not responding to Pepcid and Benadryl.  TDD.  Marland Kitchen. Levaquin [Levofloxacin] Nausea Only  . Penicillins Hives  . Doxycycline Rash    Patient Measurements: Height: 5\' 9"  (175.3 cm) Weight: 248 lb 7.3 oz (112.7 kg) IBW/kg (Calculated) : 70.7  Vital Signs: Temp: 97.5 F (36.4 C) (12/08 0508) Temp Source: Axillary (12/08 0508) BP: 108/66 mmHg (12/08 0508) Pulse Rate: 70 (12/08 0508)  Labs:  Recent Labs  10/20/15 0330 10/21/15 1114 10/22/15 0449  HGB 8.4*  --  9.0*  HCT 27.5*  --  29.3*  PLT 475*  --  476*  LABPROT 28.2* 24.5* 23.7*  INR 2.70* 2.23* 2.14*  CREATININE 0.38*  --   --     Estimated Creatinine Clearance: 106.3 mL/min (by C-G formula based on Cr of 0.38).   Medications:  Scheduled:  . antiseptic oral rinse  15 mL Mouth Rinse TID PC  . cefTRIAXone (ROCEPHIN)  IV  2 g Intravenous Q24H  . furosemide  40 mg Oral Daily  . hydrocortisone sodium succinate  50 mg Intravenous 4 times per day  . magnesium oxide  800 mg Oral BID  . metronidazole  500 mg Intravenous Q8H  . multivitamin with minerals  1 tablet Oral Daily  . nutrition supplement (JUVEN)  1 packet Oral BID BM  . pantoprazole  40 mg Oral Daily  . potassium chloride  30 mEq Oral BID  . protein supplement  2 oz Oral QID  . vancomycin  1,000 mg Intravenous Q12H  . Warfarin - Pharmacist Dosing Inpatient   Does not apply q1800   Infusions:    Assessment: Patient known to pharmacy for dosing of antibiotics. Patient was on warfarin prior to admission at a dose of 5mg  q6pm for hx DVT was on hold at admission but now to restart warfarin per pharmacy dosing  Today, 10/22/2015  INR therapeutic at 2.14 - warfarin resumed 12/6  Hgb low but stable, Plt stable   No reported bleeding  Drug interactions:  Flagyl  Goal of Therapy:  INR 2-3   Plan:   Warfarin 2.5mg  x 1 today  Daily PT/INR   Jonathan Le RPh 10/22/2015, 8:20 AM Pager [phone]

## 2015-10-22 NOTE — Progress Notes (Signed)
[facility]   Reason for visit: Follow up on intra-abdominal abscess  Interval History: he feels better, particularly after receiving lasix this am.  No abdominal pain. Drainage still from JP. No fever, no chills.  Eating.  Continues on antibiotics.  No new cultures.   Physical Exam: Constitutional:  Filed Vitals:   10/22/15 1027 10/22/15 1321  BP: 128/76 116/62  Pulse: 71 69  Temp: 97.4 F (36.3 C) 97.4 F (36.3 C)  Resp: 18 18   patient appears in NAD Respiratory: Normal respiratory effort; CTA B Cardiovascular: RRR  Review of Systems: Constitutional: negative for chills and malaise Gastrointestinal: negative for nausea and diarrhea  Lab Results  Component Value Date   WBC 13.0* 10/22/2015   HGB 9.0* 10/22/2015   HCT 29.3* 10/22/2015   MCV 85.7 10/22/2015   PLT 476* 10/22/2015    Lab Results  Component Value Date   CREATININE 0.38* 10/20/2015   BUN 17 10/20/2015   NA 133* 10/20/2015   K 4.8 10/20/2015   CL 110 10/20/2015   CO2 20* 10/20/2015    Lab Results  Component Value Date   ALT 14* 10/20/2015   AST 13* 10/20/2015   ALKPHOS 102 10/20/2015     Microbiology: Recent Results (from the past 240 hour(s))  Blood Culture (routine x 2)     Status: None   Collection Time: 10/16/15  5:18 PM  Result Value Ref Range Status   Specimen Description BLOOD RIGHT ARM  Final   Special Requests BOTTLES DRAWN AEROBIC AND ANAEROBIC  Final   Culture NO GROWTH 5 DAYS  Final   Report Status 10/21/2015 FINAL  Final  Blood Culture (routine x 2)     Status: None   Collection Time: 10/16/15  5:53 PM  Result Value Ref Range Status   Specimen Description BLOOD RIGHT HAND  Final   Special Requests BOTTLES DRAWN AEROBIC AND ANAEROBIC  Final   Culture NO GROWTH 5 DAYS  Final   Report Status 10/21/2015 FINAL  Final  Urine culture     Status: None   Collection Time: 10/16/15  6:40 PM  Result Value Ref Range Status   Specimen Description  URINE, CLEAN CATCH  Final   Special Requests NONE  Final   Culture MULTIPLE SPECIES PRESENT, SUGGEST RECOLLECTION  Final   Report Status 10/18/2015 FINAL  Final  Culture, routine-abscess     Status: None   Collection Time: 10/17/15  3:20 PM  Result Value Ref Range Status   Specimen Description PERITONEAL ABSCESS  Final   Special Requests NONE  Final   Gram Stain   Final    ABUNDANT WBC PRESENT, PREDOMINANTLY PMN NO SQUAMOUS EPITHELIAL CELLS SEEN ABUNDANT GRAM POSITIVE COCCI IN PAIRS IN CHAINS MODERATE GRAM NEGATIVE RODS Performed at [facility]    Culture   Final    ABUNDANT ESCHERICHIA COLI Performed at [facility]    Report Status 10/20/2015 FINAL  Final   Organism ID, Bacteria ESCHERICHIA COLI  Final      Susceptibility   Escherichia coli - MIC*    AMPICILLIN <=2 SENSITIVE Sensitive     AMPICILLIN/SULBACTAM <=2 SENSITIVE Sensitive     CEFAZOLIN <=4 SENSITIVE Sensitive     CEFEPIME <=1 SENSITIVE Sensitive     CEFTAZIDIME <=1 SENSITIVE Sensitive     CEFTRIAXONE <=1 SENSITIVE Sensitive     CIPROFLOXACIN <=0.25 SENSITIVE Sensitive     GENTAMICIN <=1 SENSITIVE Sensitive  IMIPENEM <=0.25 SENSITIVE Sensitive     PIP/TAZO <=4 SENSITIVE Sensitive     TOBRAMYCIN <=1 SENSITIVE Sensitive     TRIMETH/SULFA Value in next row Sensitive      <=20 SENSITIVE(NOTE)    * ABUNDANT ESCHERICHIA COLI  Anaerobic culture     Status: None   Collection Time: 10/17/15  3:20 PM  Result Value Ref Range Status   Specimen Description PERITONEAL ABSCESS  Final   Special Requests NONE  Final   Gram Stain   Final    ABUNDANT WBC PRESENT, PREDOMINANTLY PMN NO SQUAMOUS EPITHELIAL CELLS SEEN ABUNDANT GRAM POSITIVE COCCI IN PAIRS IN CHAINS MODERATE GRAM NEGATIVE RODS [facility]    Culture   Final    NO ANAEROBES ISOLATED [facility]formed at [facility]    Report Status 10/22/2015 FINAL  Final  MRSA PCR Screening     Status: None   Collection Time:  10/18/15  8:32 PM  Result Value Ref Range Status   MRSA by PCR NEGATIVE NEGATIVE Final    Comment:        The GeneXpert MRSA Assay (FDA approved for NASAL specimens only), is one component of a comprehensive MRSA colonization surveillance program. It is not intended to diagnose MRSA infection nor to guide or monitor treatment for MRSA infections.     Impression/Plan:  1. Intra-abdominal abscess - on ceftriaxone and flagyl along with vancomycin.  Likely mixed.  I have kept on the vancomycin for now since his gram stain is positive for abundant gram positive cocci in pairs and his history of Enterococcus.  No changes for now. Still with drainage and plan per surgery/IR for CT next week.  2. Anxiety - no issues today.

## 2015-10-22 NOTE — Progress Notes (Signed)
Referring Physician(s): [facility] Dr. Corliss Skainssuei  Chief Complaint: Intra-abdominal abscess s/p perc drain placed by IR 10/17/15  Subjective: Patient denies any abdominal pain, nausea, vomiting, fever or chills. He states he is tired today from staying up late to watch movie. He is tolerating regular diet.   Allergies: Unasyn; Levaquin; Penicillins; and Doxycycline  Medications: Prior to Admission medications   Medication Sig Start Date End Date Taking? Authorizing Provider  acetaminophen (TYLENOL) 325 MG tablet Take 2 tablets (650 mg total) by mouth 2 (two) times daily with breakfast and lunch. 09/24/15  Yes Evlyn KannerPamela S Love, PA-C  Amino Acids-Protein Hydrolys (FEEDING SUPPLEMENT, PRO-STAT SUGAR FREE 64,) LIQD Take 30 mLs by mouth 3 (three) times daily. 09/24/15  Yes Evlyn KannerPamela S Love, PA-C  antiseptic oral rinse (BIOTENE) LIQD 15 mLs by Mouth Rinse route 3 (three) times daily after meals. 09/24/15  Yes Evlyn KannerPamela S Love, PA-C  diclofenac sodium (VOLTAREN) 1 % GEL Apply 2 g topically 4 (four) times daily. 09/24/15  Yes Evlyn KannerPamela S Love, PA-C  famotidine (PEPCID) 20 MG tablet Take 1 tablet (20 mg total) by mouth 2 (two) times daily. 09/24/15  Yes Evlyn KannerPamela S Love, PA-C  finasteride (PROSCAR) 5 MG tablet Take 1 tablet (5 mg total) by mouth daily. 09/24/15  Yes Evlyn KannerPamela S Love, PA-C  furosemide (LASIX) 40 MG tablet Take 1 tablet (40 mg total) by mouth daily. 09/24/15  Yes Evlyn KannerPamela S Love, PA-C  HYDROcodone-acetaminophen (NORCO/VICODIN) 5-325 MG tablet Take 1 tablet by mouth every 6 (six) hours as needed for moderate pain.   Yes Historical Provider, MD  magnesium oxide (MAG-OX) 400 (241.3 MG) MG tablet Take 2 tablets (800 mg total) by mouth 2 (two) times daily. 09/24/15  Yes Evlyn KannerPamela S Love, PA-C  Menthol-Methyl Salicylate (MUSCLE RUB) 10-15 % CREA Apply 1 application topically 3 (three) times daily before meals. 09/24/15  Yes Evlyn KannerPamela S Love, PA-C  Multiple Vitamins-Minerals (DECUBI-VITE PO) Take 1 tablet by mouth daily.    Yes Historical Provider, MD  ondansetron (ZOFRAN) 4 MG tablet Take 1 tablet (4 mg total) by mouth every 6 (six) hours as needed for nausea. 09/24/15  Yes Evlyn KannerPamela S Love, PA-C  pantoprazole (PROTONIX) 40 MG tablet Take 40 mg by mouth daily.   Yes Historical Provider, MD  potassium chloride (K-DUR,KLOR-CON) 10 MEQ tablet Take 30 mEq by mouth 2 (two) times daily.   Yes Historical Provider, MD  PRESCRIPTION MEDICATION Take 120 mLs by mouth 2 (two) times daily. Medpass   Yes Historical Provider, MD  tamsulosin (FLOMAX) 0.4 MG CAPS capsule Take 1 capsule (0.4 mg total) by mouth daily after supper. 09/24/15  Yes Evlyn KannerPamela S Love, PA-C  warfarin (COUMADIN) 5 MG tablet Take 1 tablet (5 mg total) by mouth daily at 6 PM. 09/24/15  Yes Evlyn KannerPamela S Love, PA-C   Vital Signs: BP 128/76 mmHg  Pulse 71  Temp(Src) 97.4 F (36.3 C) (Oral)  Resp 18  Ht 5\' 9"  (1.753 m)  Wt 248 lb 7.3 oz (112.7 kg)  BMI 36.67 kg/m2  SpO2 100%  Physical Exam General: A&Ox3, NAD Abd: Colostomy intact, soft, NT, perc drain intact- NT, white purulent output in bulb 10-15cc, 140 cc/24 hrs  Imaging: No results found.  Labs:  CBC:  Recent Labs  10/18/15 0246 10/19/15 0350 10/20/15 0330 10/22/15 0449  WBC 7.2 5.3 9.4 13.0*  HGB 7.9* 8.0* 8.4* 9.0*  HCT 24.5* 25.8* 27.5* 29.3*  PLT 332 388 475* 476*    COAGS:  Recent Labs  07/21/15  0815  07/23/15 1200 07/25/15 0419 07/30/15 0627  10/19/15 1115 10/20/15 0330 10/21/15 1114 10/22/15 0449  INR 1.65*  < > 2.62* 1.48 1.35  < > 2.72* 2.70* 2.23* 2.14*  APTT 29  --  38* 28 31  --   --   --   --   --   < > = values in this interval not displayed.  BMP:  Recent Labs  10/17/15 0348 10/18/15 0246 10/19/15 0350 10/20/15 0330  NA 135 136 138 133*  K 3.6 3.8 4.2 4.8  CL 107 109 113* 110  CO2 20* 21* 20* 20*  GLUCOSE 108* 93 171* 163*  BUN CALCIUM 8.4* 8.1* 8.6* 8.7*  CREATININE 0.40* 0.41* 0.46* 0.38*  GFRNONAA >60 >60 >60 >60  GFRAA >60 >60 >60 >60     LIVER FUNCTION TESTS:  Recent Labs  07/25/15 0419  08/16/15 0701 08/28/15 0430 10/16/15 1718 10/20/15 0330  BILITOT 0.9  --   --  0.6 0.5 0.2*  AST 35  --   --  22 27 13*  ALT 14*  --   --  10* 29 14*  ALKPHOS 199*  --   --  80 150* 102  PROT 4.5*  --   --  5.2* 5.8* 5.7*  ALBUMIN <1.0*  < > 1.2* 1.6* 1.5* 2.0*  < > = values in this interval not displayed.  Assessment and Plan: S/p total abdominal colectomy with ileostomy secondary to ischemic colitis 06/18/15 wound vac and ileostomy - 06/20/15 S/p hospitalized 10/13-11/11/16 for intraabdominal abscess drainage, possible stump breakdown. S/p intra-abdominal abscess perc drain placed 10/17/15 by IR Purulent output, wbc trend up today 13 (9)- follow, afebrile, Cx E. Coli Plans per [facility], repeat CT once output less and less purulent - Per [facility] When drainage diminishes would recommend repeat CT with drain study and gastrografin enema to see if rectum/stump is affected   Signed: Pattricia Boss D 10/22/2015, 12:55 PM   I spent a total of 15 Minutes at the the patient's bedside AND on the patient's hospital floor or unit, greater than 50% of which was counseling/coordinating care for intra-abdominal abscess

## 2015-10-22 NOTE — Progress Notes (Signed)
[facility] PROGRESS NOTE  Jonathan Le:[id number]:[id number] DOB: 12/24/1945 DOA: 10/16/2015 PCP: Jonathan Le   Brief narrative 69 year old male with medical history of CAD status post CABG in July 2016, CHF with EF of 30-35 %, hypertension and history of DVT on Coumadin, obstructive sleep apnea on CPAP, glaucoma, stage 4 sacral decubitus, development of ischemic colitis in August 2016 for which he underwent excoriated a laparotomy with total colectomy and ileostomy with prolonged intubation requiring tracheostomy, followed by a complicated course with multiple abdominal and pelvic abscesses that have been drained. His last CT scan from 09/18/2015 showed fluid collection to have resolved and drains were removed. Patient was then sent to [facility]. For the last 2 weeks he was having vague abdominal pain with occasional fever. He was found to have a UTI and was started on antibiotics. However for the past 2 days he was having low blood pressure. He was sent for outpatient CT scan of the abdomen which showed a large central abdominal abscess measuring [id number] cm and was sent to the ED. Patient was found to be in septic shock with fever of 102.44F, tachycardic, hypotensive (blood pressure 83/37 mmHg) and significant leukocytosis (16.9 K). Patient admitted to stepdown unit on empiric antibiotics. General surgery consulted . Patient had drainage of his abdominal abscess by IR on 12/3.   Assessment/Plan: Recurrent Intra-abdominal abscess with sepsis/septic shock -Sepsis now resolved. -CT drainage of the abdominal abscess by IR on 12/3.Marland Kitchen. Continuous significant output from the drain. Cultures growing Escherichia coli which is pansensitive. Continued vancomycin given gram-positive cocci on Gram stain. Narrowed antibiotic to ceftriaxone and Flagyl. Appreciate ID recommendation. Total course of antibiotic duration per ID.  Patient was treated with Unasyn followed by Augmentin  for enterococcus on the abscess culture upon discharge in October 2016 (I think received total 1 month of treatment). -The source of abscess is not clear. As per surgery, when the drain output decreases they would like to do the following: #1 repeat CT scan to ensure complete drainage of the abscess #2 below drain study #3-dosed Gastrografin enema to make sure he does not have a small leak from the rectal stump. - IR on board  CAD status post CABG with cardiomyopathy Patient had NSTEMI in July 2016 with cardiogenic shock . Three-vessel disease seen on cardiac cath with EF of 30-35% He underwent CABG. Currently has some volume overload. Off fluid. Continue low-dose Lasix. Monitor strict I/O.   Abdominal pain Secondary to abscess. Improved. Continue when necessary pain meds  Stage IV sacral decubitus ulcer Appears unchanged. Wound care consult appreciated. As per surgery who had spoken with plastic surgeon Jonathan Le he may qualify to get a surgical flap for the decubitus ulcer once nutritional status is satisfactory. [facility]  Anemia of chronic disease Reportedly had some blood in his colostomy . Mild drop in hemoglobin but around baseline. INR therapeutic. Resumed Coumadin.  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 . H&H stable.  resumed coumadin.  OSA Continue nighttime CPAP  Protein calorie malnutrition Nutritionist consulted. Switched nutritional supplements   Oliguria Possibly with hypovolemia and sepsis.renal fn normal. Urine output improving.  Diet: regular ( per patient's insistance) DVT prophylaxis: Therapeutic INR  Code Status: Full code Family Communication: updated wife on the phone. Disposition Plan:  transfer to tele  . Needs skilled nursing facility for PT evaluation.    Consultants:  CCS  PCCM  IR  ID  Procedures:  CT drainage of abdominal abscess on 12/4  Antibiotics:  IV  vancomycin, Flagyl 12/2-  Cefepime 12/3--12/6  Rocephin 12/6---  HPI/Subjective: Seen and examined. Denies any symptoms. No overnight issues. Blood pressure stable.  Objective: Filed Vitals:   10/22/15 1027 10/22/15 1321  BP: 128/76 116/62  Pulse: 71 69  Temp: 97.4 F (36.3 C) 97.4 F (36.3 C)  Resp: 18 18    Intake/Output Summary (Last 24 hours) at 10/22/15 1528 Last data filed at 10/22/15 1300  Gross per 24 hour  Intake    250 ml  Output   1825 ml  Net  -1575 ml   Filed Weights   10/19/15 0400 10/21/15 0628 10/22/15 0508  Weight: 109.8 kg (242 lb 1 oz) 113.7 kg (250 lb 10.6 oz) 112.7 kg (248 lb 7.3 oz)    Exam:   General: Elderly male not in distress  HEENT: Pallor present, moist mucosa, supple neck  Chest: Fair bilaterally, no rhonchi or wheeze  Cardiovascular: S1 and S2 regular, 3/6 systolic murmur, no rubs or gallop  GI: Soft, nondistended, nontender, abdominal drain in place with clean dressing with active drainage of purulent material , ileostomy bag in place with  clear stool, bowel sounds present  Musculoskeletal: Warm, trace  pitting edema bilaterally, stage IV sacral decubitus  CNS: Alert and oriented  Data Reviewed: Basic Metabolic Panel:  Recent Labs Lab 10/16/15 1718 10/17/15 0348 10/18/15 0246 10/19/15 0350 10/20/15 0330  NA 130* 135 136 138 133*  K 4.5 3.6 3.8 4.2 4.8  CL 100* 107 109 113* 110  CO2 21* 20* 21* 20* 20*  GLUCOSE 123* 108* 93 171* 163*  BUN 11 12 9 11 17   CREATININE 0.66 0.40* 0.41* 0.46* 0.38*  CALCIUM 8.6* 8.4* 8.1* 8.6* 8.7*   Liver Function Tests:  Recent Labs Lab 10/16/15 1718 10/20/15 0330  AST 27 13*  ALT 29 14*  ALKPHOS 150* 102  BILITOT 0.5 0.2*  PROT 5.8* 5.7*  ALBUMIN 1.5* 2.0*    Recent Labs Lab 10/16/15 1718  LIPASE 18   No results for input(s): AMMONIA in the last 168 hours. CBC:  Recent Labs Lab 10/16/15 1718 10/17/15 0348 10/18/15 0246 10/19/15 0350 10/20/15 0330 10/22/15 0449   WBC 16.9* 11.6* 7.2 5.3 9.4 13.0*  NEUTROABS 14.0*  --   --   --   --   --   HGB 9.5* 8.7* 7.9* 8.0* 8.4* 9.0*  HCT 28.8* 27.1* 24.5* 25.8* 27.5* 29.3*  MCV 84.0 85.5 84.2 86.3 85.9 85.7  PLT 395 380 332 388 475* 476*   Cardiac Enzymes: No results for input(s): CKTOTAL, CKMB, CKMBINDEX, TROPONINI in the last 168 hours. BNP (last 3 results)  Recent Labs  07/28/15 0500 08/02/15 0620 08/16/15 0701  BNP 453.8* 367.7* 528.3*    ProBNP (last 3 results) No results for input(s): PROBNP in the last 8760 hours.  CBG: No results for input(s): GLUCAP in the last 168 hours.  Recent Results (from the past 240 hour(s))  Blood Culture (routine x 2)     Status: None   Collection Time: 10/16/15  5:18 PM  Result Value Ref Range Status   Specimen Description BLOOD RIGHT ARM  Final   Special Requests BOTTLES DRAWN AEROBIC AND ANAEROBIC  Final   Culture NO GROWTH 5 DAYS  Final   Report Status 10/21/2015 FINAL  Final  Blood Culture (routine x 2)     Status: None   Collection Time: 10/16/15  5:53 PM  Result Value Ref Range Status   Specimen Description BLOOD RIGHT HAND  Final   Special Requests BOTTLES DRAWN AEROBIC AND ANAEROBIC  Final   Culture NO GROWTH 5 DAYS  Final   Report Status 10/21/2015 FINAL  Final  Urine culture     Status: None   Collection Time: 10/16/15  6:40 PM  Result Value Ref Range Status   Specimen Description URINE, CLEAN CATCH  Final   Special Requests NONE  Final   Culture MULTIPLE SPECIES PRESENT, SUGGEST RECOLLECTION  Final   Report Status 10/18/2015 FINAL  Final  Culture, routine-abscess     Status: None   Collection Time: 10/17/15  3:20 PM  Result Value Ref Range Status   Specimen Description PERITONEAL ABSCESS  Final   Special Requests NONE  Final   Gram Stain   Final    ABUNDANT WBC PRESENT, PREDOMINANTLY PMN NO SQUAMOUS EPITHELIAL CELLS SEEN ABUNDANT GRAM POSITIVE COCCI IN PAIRS IN CHAINS MODERATE GRAM NEGATIVE RODS Performed at [facility]    Culture   Final    ABUNDANT ESCHERICHIA COLI Performed at [facility]    Report Status 10/20/2015 FINAL  Final   Organism ID, Bacteria ESCHERICHIA COLI  Final      Susceptibility   Escherichia coli - MIC*    AMPICILLIN <=2 SENSITIVE Sensitive     AMPICILLIN/SULBACTAM <=2 SENSITIVE Sensitive     CEFAZOLIN <=4 SENSITIVE Sensitive     CEFEPIME <=1 SENSITIVE Sensitive     CEFTAZIDIME <=1 SENSITIVE Sensitive     CEFTRIAXONE <=1 SENSITIVE Sensitive     CIPROFLOXACIN <=0.25 SENSITIVE Sensitive     GENTAMICIN <=1 SENSITIVE Sensitive     IMIPENEM <=0.25 SENSITIVE Sensitive     PIP/TAZO <=4 SENSITIVE Sensitive     TOBRAMYCIN <=1 SENSITIVE Sensitive     TRIMETH/SULFA Value in next row Sensitive      <=20 SENSITIVE(NOTE)    * ABUNDANT ESCHERICHIA COLI  Anaerobic culture     Status: None   Collection Time: 10/17/15  3:20 PM  Result Value Ref Range Status   Specimen Description PERITONEAL ABSCESS  Final   Special Requests NONE  Final   Gram Stain   Final    ABUNDANT WBC PRESENT, PREDOMINANTLY PMN NO SQUAMOUS EPITHELIAL CELLS SEEN ABUNDANT GRAM POSITIVE COCCI IN PAIRS IN CHAINS MODERATE GRAM NEGATIVE RODS Performed at [facility]    Culture   Final    NO ANAEROBES ISOLATED Performed at [facility]    Report Status 10/22/2015 FINAL  Final  MRSA PCR Screening     Status: None   Collection Time: 10/18/15  8:32 PM  Result Value Ref Range Status   MRSA by PCR NEGATIVE NEGATIVE Final    Comment:        The GeneXpert MRSA Assay (FDA approved for NASAL specimens only), is one component of a comprehensive MRSA colonization surveillance program. It is not intended to diagnose MRSA infection nor to guide or monitor treatment for MRSA infections.      Studies: No results found.  Scheduled Meds: . antiseptic oral rinse  15 mL Mouth Rinse TID PC  . cefTRIAXone (ROCEPHIN)  IV  2 g Intravenous Q24H  . furosemide  40 mg Oral Daily  .  hydrocortisone sodium succinate  50 mg Intravenous 4 times per day  . magnesium oxide  800 mg Oral BID  . metronidazole  500 mg Intravenous Q8H  . multivitamin with minerals  1 tablet Oral Daily  .  nutrition supplement (JUVEN)  1 packet Oral BID BM  . pantoprazole  40 mg Oral Daily  . potassium chloride  30 mEq Oral BID  . protein supplement  2 oz Oral QID  . vancomycin  1,000 mg Intravenous Q12H  . warfarin  2.5 mg Oral ONCE-1800  . Warfarin - Pharmacist Dosing Inpatient   Does not apply q1800   Continuous Infusions:    Time spent: 25 minutes   Penny Pia  [facility] Pager [phone]. If 7PM-7AM, please contact night-coverage at [email or web address], password [facility] 10/22/2015, 3:28 PM  LOS: 6 days

## 2015-10-22 NOTE — Progress Notes (Signed)
Subjective: Sleeping with CPAP, no complaints of pain, not walking much, says he needs to do more.  No complaints of abdominal pain.  Tolerating a regular diet.  Drainage is still white purulent in character.  Drainage volume is slowly decreasing.  On 12/4 he put out 500 ml   Objective: Vital signs in last 24 hours: Temp:  [97.4 F (36.3 C)-97.8 F (36.6 C)] 97.5 F (36.4 C) (12/08 0508) Pulse Rate:  [70-83] 70 (12/08 0508) Resp:  [18-20] 18 (12/08 0508) BP: (101-120)/(47-77) 108/66 mmHg (12/08 0508) SpO2:  [96 %-99 %] 99 % (12/08 0508) Weight:  [112.7 kg (248 lb 7.3 oz)] 112.7 kg (248 lb 7.3 oz) (12/08 0508) Last BM Date: 10/20/15 PO:  240 Drain:  140 Ileostomy:  775 Afebrile, VSS WBC up today to 13K INR 2.14 CT was 10/18/15 Intake/Output from previous day: 12/07 0701 - 12/08 0700 In: 250 [P.O.:240] Out: 3265 [Urine:2350; Drains:140; Stool:775] Intake/Output this shift:    General appearance: alert, cooperative and no distress GI: soft, non-tender; bowel sounds normal; no masses,  no organomegaly and ileostomy working well, wound is healing in nicely and the drain is still putting out purulent white fluid. Ongoing lower leg edema,  Lab Results:   Recent Labs  10/20/15 0330 10/22/15 0449  WBC 9.4 13.0*  HGB 8.4* 9.0*  HCT 27.5* 29.3*  PLT 475* 476*    BMET  Recent Labs  10/20/15 0330  NA 133*  K 4.8  CL 110  CO2 20*  GLUCOSE 163*  BUN 17  CREATININE 0.38*  CALCIUM 8.7*   PT/INR  Recent Labs  10/21/15 1114 10/22/15 0449  LABPROT 24.5* 23.7*  INR 2.23* 2.14*     Recent Labs Lab 10/16/15 1718 10/20/15 0330  AST 27 13*  ALT 29 14*  ALKPHOS 150* 102  BILITOT 0.5 0.2*  PROT 5.8* 5.7*  ALBUMIN 1.5* 2.0*     Lipase     Component Value Date/Time   LIPASE 18 10/16/2015 1718     Studies/Results: No results found.  Medications: . antiseptic oral rinse  15 mL Mouth Rinse TID PC  . cefTRIAXone (ROCEPHIN)  IV  2 g Intravenous Q24H  .  furosemide  40 mg Oral Daily  . hydrocortisone sodium succinate  50 mg Intravenous 4 times per day  . magnesium oxide  800 mg Oral BID  . metronidazole  500 mg Intravenous Q8H  . multivitamin with minerals  1 tablet Oral Daily  . nutrition supplement (JUVEN)  1 packet Oral BID BM  . pantoprazole  40 mg Oral Daily  . potassium chloride  30 mEq Oral BID  . protein supplement  2 oz Oral QID  . vancomycin  1,000 mg Intravenous Q12H  . warfarin  2.5 mg Oral ONCE-1800  . Warfarin - Pharmacist Dosing Inpatient   Does not apply q1800       ESCHERICHIA COLI     Antibiotic Sensitivity Microscan Status    AMPICILLIN Sensitive <=2 SENSITIVE Final    Method: MIC    AMPICILLIN/SULBACTAM Sensitive <=2 SENSITIVE Final    Method: MIC    CEFAZOLIN Sensitive <=4 SENSITIVE Final    Method: MIC    CEFEPIME Sensitive <=1 SENSITIVE Final    Method: MIC    CEFTAZIDIME Sensitive <=1 SENSITIVE Final    Method: MIC    CEFTRIAXONE Sensitive <=1 SENSITIVE Final    Method: MIC    CIPROFLOXACIN Sensitive <=0.25 SENSITIVE Final    Method: MIC    GENTAMICIN Sensitive <=  1 SENSITIVE Final    Method: MIC    IMIPENEM Sensitive <=0.25 SENSITIVE Final    Method: MIC    PIP/TAZO Sensitive <=4 SENSITIVE Final    Method: MIC    TOBRAMYCIN Sensitive <=1 SENSITIVE Final    Method: MIC    TRIMETH/SULFA Sensitive <=20 SENSITIVE (NOTE) Final    Method: MIC    Comments ESCHERICHIA COLI (MIC)    ABUNDANT ESCHERICHIA COLI                 Assessment/Plan 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 Dwain Sarna, closure of abdominal wall, wound vac and ileostomy - 06/20/15, Dr. Donell Beers; after CABG 05/22/15, Dr. Kathlee Nations Trigt. S/p hospitalized 10/13-11/11/16 for intraabdominal abscess drainage, possible stump breakdown. IR drain placement 10/17/15 OSA with CPAP Sacral decubitus - currently clean and looks good - will be  eligible for flap when his prealbumin is >16 per Dr. Kelly Splinter Chronic respiratory failure - resolved  Chronic systolic CHF (EF 16%)  Hypertension Atrial fibrillation Malnutrition - Debility Antibiotics:  Vancomycin Day 6, Flagyl Day 7, now on Rocephin day 2 (Day 7 of cephalosporins) Culture report above.  Ongoing  Vancomycin per DR. Comer ID. DVT: on coumadin (INR 2.14)  Plan:  Continue antibiotics   LOS: 6 days    Jonathan Le 10/22/2015

## 2015-10-23 DIAGNOSIS — L89159 Pressure ulcer of sacral region, unspecified stage: Secondary | ICD-10-CM

## 2015-10-23 LAB — CREATININE, SERUM
CREATININE: 0.56 mg/dL — AB (ref 0.61–1.24)
GFR calc Af Amer: >60 mL/min (ref >60)

## 2015-10-23 LAB — PROTIME-INR
INR: 1.98 — AB (ref 0.00–1.49)
Prothrombin Time: 22.4 seconds — ABNORMAL HIGH (ref 11.6–15.2)

## 2015-10-23 MED ORDER — WARFARIN SODIUM 5 MG PO TABS
5.0000 mg | ORAL_TABLET | Freq: Once | ORAL | Status: AC
  Administered 2015-10-23: 5 mg via ORAL
  Filled 2015-10-23: qty 1

## 2015-10-23 NOTE — Progress Notes (Signed)
Subjective: No significant change.  He is not having pain. Ileostomy is working well.  He like the Unjury, but is concerned with the salt content.  770 mg per package in the soup.  Output from the drain is down, but still white purulent colored fluid.2  Objective: Vital signs in last 24 hours: Temp:  [97.4 F (36.3 C)-97.7 F (36.5 C)] 97.6 F (36.4 C) (12/09 0630) Pulse Rate:  [64-83] 64 (12/09 0630) Resp:  [18-20] 18 (12/09 0630) BP: (116-134)/(62-76) 120/73 mmHg (12/09 0630) SpO2:  [96 %-100 %] 97 % (12/09 0630) Weight:  [113.2 kg (249 lb 9 oz)] 113.2 kg (249 lb 9 oz) (12/09 0630) Last BM Date: 10/20/15 360 PO Drain down to 60 100 recorded from the ostomy Afebrile, VSS WBC is up to 13K, INR 1.98 Intake/Output from previous day: 12/08 0701 - 12/09 0700 In: 360 [P.O.:360] Out: 735 [Urine:575; Drains:60; Stool:100] Intake/Output this shift:    General appearance: alert, cooperative and no distress GI: soft, non-tender; bowel sounds normal; no masses,  no organomegaly and ileostomy working well, wound OK, drain unchanged.    Lab Results:   Recent Labs  10/22/15 0449  WBC 13.0*  HGB 9.0*  HCT 29.3*  PLT 476*    BMET  Recent Labs  10/23/15 0510  CREATININE 0.56*   PT/INR  Recent Labs  10/22/15 0449 10/23/15 0510  LABPROT 23.7* 22.4*  INR 2.14* 1.98*     Recent Labs Lab 10/16/15 1718 10/20/15 0330  AST 27 13*  ALT 29 14*  ALKPHOS 150* 102  BILITOT 0.5 0.2*  PROT 5.8* 5.7*  ALBUMIN 1.5* 2.0*     Lipase     Component Value Date/Time   LIPASE 18 10/16/2015 1718     Studies/Results: No results found.  Medications: . antiseptic oral rinse  15 mL Mouth Rinse TID PC  . cefTRIAXone (ROCEPHIN)  IV  2 g Intravenous Q24H  . furosemide  40 mg Oral Daily  . hydrocortisone sodium succinate  50 mg Intravenous 4 times per day  . magnesium oxide  800 mg Oral BID  . metronidazole  500 mg Intravenous Q8H  . multivitamin with minerals  1 tablet Oral  Daily  . nutrition supplement (JUVEN)  1 packet Oral BID BM  . pantoprazole  40 mg Oral Daily  . potassium chloride  30 mEq Oral BID  . protein supplement  2 oz Oral QID  . vancomycin  1,000 mg Intravenous Q12H  . 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 [place], closure of abdominal wall, wound vac and ileostomy - 06/20/15, Dr. Donell BeersByerly; after CABG 05/22/15, Dr. Kathlee NationsPeter Van Trigt. S/p hospitalized 10/13-11/11/16 for intraabdominal abscess drainage, possible stump breakdown. IR drain placement 10/17/15 OSA with CPAP Sacral decubitus - currently clean and looks good - will be eligible for flap when his prealbumin is >16 per Dr. Kelly SplinterSanger Chronic respiratory failure - resolved  Chronic systolic CHF (EF 16%25%)  Hypertension Atrial fibrillation Malnutrition - Debility Antibiotics: Vancomycin Day 6, Flagyl Day 8, now on Rocephin day 2 (Day 8 of cephalosporins) Culture report above. Ongoing Vancomycin per DR. Comer ID. DVT: on coumadin (INR 2.14)   PLAN:  Continue antibiotics, he is eating well, working non nutrition.  WBC is up and we can follow that.  No fevers.  We plan to get CT, drain injection and Gastografin enema when drainage gets lower.  Hopefully  early next week.      LOS: 7 days    Lief Palmatier 10/23/2015

## 2015-10-23 NOTE — Progress Notes (Signed)
Pharmacy Antibiotic Note  Jonathan Le is a 69 y.o. year-old male admitted on 10/16/2015.  Earlier today, Vancomycin, Cefepime & Metronidazole were d/c'ed and patient placed on Ceftriaxone 2gm IV q24h.  ID consulted who recommended continuing Ceftriaxone and adding back the Metronidazole and Vancomycin.  Vancomycin was continued for GPC in pairs seen on gram stain.  IAI with multiple abdominal abscesses with positive Ecoli culture  Assessment: Intra-abdominal abscess - on ceftriaxone and flagyl along with vancomycin. No changes for now (per ID) Still with drainage and plan per surgery/IR for CT next week.  12/6 Vanc trough = 17 mcg/ml (Goal 15-20)   Plan:  Continue Ceftriaxone & Metronidazole per MD   Continue Vancomycin 1gm IV q12h  Temp (24hrs), Avg:97.5 F (36.4 C), Min:97.4 F (36.3 C), Max:97.7 F (36.5 C)   Recent Labs Lab 10/17/15 0348 10/18/15 0246 10/19/15 0350 10/20/15 0330 10/22/15 0449  WBC 11.6* 7.2 5.3 9.4 13.0*     Recent Labs Lab 10/17/15 0348 10/18/15 0246 10/19/15 0350 10/20/15 0330 10/23/15 0510  CREATININE 0.40* 0.41* 0.46* 0.38* 0.56*   Estimated Creatinine Clearance: 106.6 mL/min (by C-G formula based on Cr of 0.56).   Antimicrobial allergies: Unasyn, PCN and doxy  12/3 >> rocephin >> 12/4 12/3 >> flagyl >>  12/3 >> vancomycin >> 12/4 >> cefepime >>12/6 12/6 >> ceftriaxone >>   Levels/dose changes this admission: 12/6 @ 19:00 Vanc Trough = 17 mcg/ml - cont regimen  12/2 blood:NGTD 12/2 urine: multiple species present 12/3 peritoneal abscess: abundant GNR - Ecoli - pansensitive 12/3 Gram Stain: GPC in pairs/GNR  Jonathan Le RPh 10/23/2015, 10:55 AM Pager [phone]

## 2015-10-23 NOTE — Progress Notes (Signed)
[facility]   Reason for visit: Follow up on intra-abdominal abscess  Interval History:  No abdominal pain. Drainage still from JP, more serosanguinous. No fever, no chills.  Eating.  Continues on antibiotics.  No new cultures.   Physical Exam: Constitutional:  Filed Vitals:   10/22/15 2240 10/23/15 0630  BP:  120/73  Pulse:  64  Temp:  97.6 F (36.4 C)  Resp: 20 18   patient appears in NAD Respiratory: Normal respiratory effort; CTA B Cardiovascular: RRR  Review of Systems: Constitutional: negative for chills and malaise Gastrointestinal: negative for nausea and diarrhea  Lab Results  Component Value Date   WBC 13.0* 10/22/2015   HGB 9.0* 10/22/2015   HCT 29.3* 10/22/2015   MCV 85.7 10/22/2015   PLT 476* 10/22/2015    Lab Results  Component Value Date   CREATININE 0.56* 10/23/2015   BUN 17 10/20/2015   NA 133* 10/20/2015   K 4.8 10/20/2015   CL 110 10/20/2015   CO2 20* 10/20/2015    Lab Results  Component Value Date   ALT 14* 10/20/2015   AST 13* 10/20/2015   ALKPHOS 102 10/20/2015     Microbiology: Recent Results (from the past 240 hour(s))  Blood Culture (routine x 2)     Status: None   Collection Time: 10/16/15  5:18 PM  Result Value Ref Range Status   Specimen Description BLOOD RIGHT ARM  Final   Special Requests BOTTLES DRAWN AEROBIC AND ANAEROBIC 5ML  Final   Culture NO GROWTH 5 DAYS  Final   Report Status 10/21/2015 FINAL  Final  Blood Culture (routine x 2)     Status: None   Collection Time: 10/16/15  5:53 PM  Result Value Ref Range Status   Specimen Description BLOOD RIGHT HAND  Final   Special Requests BOTTLES DRAWN AEROBIC AND ANAEROBIC 5ML  Final   Culture NO GROWTH 5 DAYS  Final   Report Status 10/21/2015 FINAL  Final  Urine culture     Status: None   Collection Time: 10/16/15  6:40 PM  Result Value Ref Range Status   Specimen Description URINE, CLEAN CATCH  Final   Special Requests NONE  Final   Culture  MULTIPLE SPECIES PRESENT, SUGGEST RECOLLECTION  Final   Report Status 10/18/2015 FINAL  Final  Culture, routine-abscess     Status: None   Collection Time: 10/17/15  3:20 PM  Result Value Ref Range Status   Specimen Description PERITONEAL ABSCESS  Final   Special Requests NONE  Final   Gram Stain   Final    ABUNDANT WBC PRESENT, PREDOMINANTLY PMN NO SQUAMOUS EPITHELIAL CELLS SEEN ABUNDANT GRAM POSITIVE COCCI IN PAIRS IN CHAINS MODERATE GRAM NEGATIVE RODS Performed at [facility]    Culture   Final    ABUNDANT ESCHERICHIA COLI Performed at [facility]    Report Status 10/20/2015 FINAL  Final   Organism ID, Bacteria ESCHERICHIA COLI  Final      Susceptibility   Escherichia coli - MIC*    AMPICILLIN <=2 SENSITIVE Sensitive     AMPICILLIN/SULBACTAM <=2 SENSITIVE Sensitive     CEFAZOLIN <=4 SENSITIVE Sensitive     CEFEPIME <=1 SENSITIVE Sensitive     CEFTAZIDIME <=1 SENSITIVE Sensitive     CEFTRIAXONE <=1 SENSITIVE Sensitive     CIPROFLOXACIN <=0.25 SENSITIVE Sensitive     GENTAMICIN <=1 SENSITIVE Sensitive     IMIPENEM <=0.25 SENSITIVE Sensitive     PIP/TAZO <=4  SENSITIVE Sensitive     TOBRAMYCIN <=1 SENSITIVE Sensitive     TRIMETH/SULFA Value in next row Sensitive      <=20 SENSITIVE(NOTE)    * ABUNDANT ESCHERICHIA COLI  Anaerobic culture     Status: None   Collection Time: 10/17/15  3:20 PM  Result Value Ref Range Status   Specimen Description PERITONEAL ABSCESS  Final   Special Requests NONE  Final   Gram Stain   Final    ABUNDANT WBC PRESENT, PREDOMINANTLY PMN NO SQUAMOUS EPITHELIAL CELLS SEEN ABUNDANT GRAM POSITIVE COCCI IN PAIRS IN CHAINS MODERATE GRAM NEGATIVE RODS Performed at [facility]    Culture   Final    NO ANAEROBES ISOLATED Performed at [facility]    Report Status 10/22/2015 FINAL  Final  MRSA PCR Screening     Status: None   Collection Time: 10/18/15  8:32 PM  Result Value Ref Range Status   MRSA by PCR NEGATIVE  NEGATIVE Final    Comment:        The GeneXpert MRSA Assay (FDA approved for NASAL specimens only), is one component of a comprehensive MRSA colonization surveillance program. It is not intended to diagnose MRSA infection nor to guide or monitor treatment for MRSA infections.     Impression/Plan:  1. Intra-abdominal abscess - on ceftriaxone and flagyl along with vancomycin.  Likely mixed.  I have kept on the vancomycin for now since his gram stain is positive for abundant gram positive cocci in pairs and his history of Enterococcus.  No changes for now. Still with drainage and plan per surgery/IR for CT next week.  2. Anxiety - no issues today.  Dr. Daiva Eves available over the weekend if needed

## 2015-10-23 NOTE — Progress Notes (Signed)
ANTICOAGULATION CONSULT NOTE - Follow Up  Pharmacy Consult for Warfarin Indication: DVT  Allergies  Allergen Reactions  . Unasyn [Ampicillin-Sulbactam Sodium] Hives, Itching and Rash    Diffuse rash not responding to Pepcid and Benadryl.  TDD.  Marland Kitchen. Levaquin [Levofloxacin] Nausea Only  . Penicillins Hives  . Doxycycline Rash    Patient Measurements: Height: 5\' 9"  (175.3 cm) Weight: 249 lb 9 oz (113.2 kg) IBW/kg (Calculated) : 70.7  Vital Signs: Temp: 97.6 F (36.4 C) (12/09 0630) Temp Source: Oral (12/09 0630) BP: 120/73 mmHg (12/09 0630) Pulse Rate: 64 (12/09 0630)  Labs:  Recent Labs  10/21/15 1114 10/22/15 0449 10/23/15 0510  HGB  --  9.0*  --   HCT  --  29.3*  --   PLT  --  476*  --   LABPROT 24.5* 23.7* 22.4*  INR 2.23* 2.14* 1.98*  CREATININE  --   --  0.56*    Estimated Creatinine Clearance: 106.6 mL/min (by C-G formula based on Cr of 0.56).   Medications:  Scheduled:  . antiseptic oral rinse  15 mL Mouth Rinse TID PC  . cefTRIAXone (ROCEPHIN)  IV  2 g Intravenous Q24H  . furosemide  40 mg Oral Daily  . hydrocortisone sodium succinate  50 mg Intravenous 4 times per day  . magnesium oxide  800 mg Oral BID  . metronidazole  500 mg Intravenous Q8H  . multivitamin with minerals  1 tablet Oral Daily  . nutrition supplement (JUVEN)  1 packet Oral BID BM  . pantoprazole  40 mg Oral Daily  . potassium chloride  30 mEq Oral BID  . protein supplement  2 oz Oral QID  . vancomycin  1,000 mg Intravenous Q12H  . Warfarin - Pharmacist Dosing Inpatient   Does not apply q1800   Infusions:    Assessment: Patient known to pharmacy for dosing of antibiotics. Patient was on warfarin prior to admission at a dose of 5mg  q6pm for hx DVT was on hold at admission but now to restart warfarin per pharmacy dosing  Today, 10/23/2015  INR therapeutic at 1.98 - warfarin resumed 12/6  Hgb low but stable, Plt stable   No reported bleeding  Drug interactions: Flagyl  Goal  of Therapy:  INR 2-3   Plan:   Warfarin 5mg  x 1 today  Daily PT/INR   Arley PhenixEllen Daleah Coulson RPh 10/23/2015, 10:48 AM Pager [phone]

## 2015-10-23 NOTE — Progress Notes (Signed)
Physical Therapy Treatment Patient Details Name: Jonathan Le MRN: [id number] DOB: 12/16/1945 Today's Date: 10/23/2015    History of Present Illness Jonathan Le is a 69 y.o. male with a history of CAD S/P CABG, CHF, HTN, Hx DVT n, OSA, and Glaucoma with a complex recent surgical history who [facility]  due to fevers and chills for the last 2 weeks . CT scan which revealed a large abscess 16 x 10 x 21 cm and he was sent to the ED for admission and treatment. prior history:.After CABG in 7/16,Patient has been in LTACH, CIR, and lastly at [facility].. Has a sacral dcubitus thet was treated in past with PLS and VAC attempts per patient. Has a colostomy and L drain from abdomen.    PT Comments    Pt required increased time and step by step directions to decrease anxiety.  Assisted OOB to recliner required + 3 assist and use of EVA B platform walker.  Pt VERY deconditioned and weak.  Stood very briefly enough to partially pivot from elevated bed to recliner.  Rec nursing use lift to assist pt back to bed.    Follow Up Recommendations  SNF     Equipment Recommendations  None recommended by PT    Recommendations for Other Services       Precautions / Restrictions Precautions Precaution Comments: sacral decub, drain lines from abdomen; knee pain, colostomy Other Brace/Splint: hinged knee brace on the RLE for comfort per PT note from previous admit- not present Restrictions Weight Bearing Restrictions: No    Mobility  Bed Mobility Overal bed mobility: Needs Assistance Bed Mobility: Supine to Sit     Supine to sit: +2 for physical assistance;Total assist (pt able 35% upper body/25% LB and 0% scooting)     General bed mobility comments: HOB elevated 40 degrees, use of bed rails then use of bed pad to swival and scoot hips to EOB.    Transfers Overall transfer level: Needs assistance Equipment used: Bilateral platform walker Transfers:  Stand Pivot Transfers Sit to Stand: Total assist;+2 safety/equipment         General transfer comment: + 3 assist plus use of EVA walker and elevated bed.  Pt stood > 15 sec first time barely clearing hips off bed and had to sit back down. second time was able to partially pivot 1/4 turn to recliner but required physical assist to complete with uncontroled decend to chair.  Pt was only able to stand briefly as therapist turned hips.     Ambulation/Gait             General Gait Details: unable to attempt due to low transfer performance.     Stairs            Wheelchair Mobility    Modified Rankin (Stroke Patients Only)       Balance                                    Cognition Arousal/Alertness: Awake/alert Behavior During Therapy: WFL for tasks assessed/performed Overall Cognitive Status: Within Functional Limits for tasks assessed                 General Comments: nervous about standing    Exercises      General Comments        Pertinent Vitals/Pain Pain Assessment: Faces Faces Pain Scale: Hurts  a little bit Pain Location: buttocks and R knee Pain Descriptors / Indicators: Constant;Grimacing Pain Intervention(s): Monitored during session;Repositioned    Home Living                      Prior Function            PT Goals (current goals can now be found in the care plan section) Progress towards PT goals: Progressing toward goals    Frequency  Min 2X/week    PT Plan Current plan remains appropriate    Co-evaluation             End of Session Equipment Utilized During Treatment: Gait belt Activity Tolerance: Treatment limited secondary to medical complications (Comment) Patient left: in chair;with call bell/phone within reach;with bed alarm set     Time: 9629-5284 PT Time Calculation (min) (ACUTE ONLY): 43 min  Charges:  $Therapeutic Exercise: 8-22 mins $Therapeutic Activity: 23-37 mins                     G Codes:      Felecia Shelling  PTA [facility]  Acute  Rehab Pager      [phone]

## 2015-10-23 NOTE — Care Management Important Message (Signed)
Important Message  Patient Details  Name: Jonathan Le MRN: [id number] Date of Birth: 04/29/1946   Medicare Important Message Given:  Yes    Haskell FlirtJamison, Clarence Cogswell 10/23/2015, 12:05 PMImportant Message  Patient Details  Name: Jonathan Le MRN: [id number] Date of Birth: 06/16/1946   Medicare Important Message Given:  Yes    Haskell FlirtJamison, Johsua Shevlin 10/23/2015, 12:04 PM

## 2015-10-23 NOTE — Progress Notes (Signed)
[facility] PROGRESS NOTE  Jonathan Le ZOX:[id number] DOB: [birth year] DOA: 10/16/2015 PCP: Elizabeth Palau, FNP   Brief narrative 69 year old male with medical history of CAD status post CABG in July 2016, CHF with EF of 30-35 %, hypertension and history of DVT on Coumadin, obstructive sleep apnea on CPAP, glaucoma, stage 4 sacral decubitus, development of ischemic colitis in August 2016 for which he underwent excoriated a laparotomy with total colectomy and ileostomy with prolonged intubation requiring tracheostomy, followed by a complicated course with multiple abdominal and pelvic abscesses that have been drained. His last CT scan from 09/18/2015 showed fluid collection to have resolved and drains were removed. Patient was then sent to [facility]. For the last 2 weeks he was having vague abdominal pain with occasional fever. He was found to have a UTI and was started on antibiotics. However for the past 2 days he was having low blood pressure. He was sent for outpatient CT scan of the abdomen which showed a large central abdominal abscess measuring 161021 cm and was sent to the ED. Patient was found to be in septic shock with fever of 102.24F, tachycardic, hypotensive (blood pressure 83/37 mmHg) and significant leukocytosis (16.9 K). Patient admitted to stepdown unit on empiric antibiotics. General surgery consulted . Patient had drainage of his abdominal abscess by IR on 12/3.   Assessment/Plan: Recurrent Intra-abdominal abscess with sepsis/septic shock -Sepsis now resolved. -CT drainage of the abdominal abscess by IR on 12/3.Marland Kitchen Continuous significant output from the drain. Cultures growing Escherichia coli which is pansensitive. Continued vancomycin given gram-positive cocci on Gram stain. Narrowed antibiotic to ceftriaxone and Flagyl.  - ID following. Total course of antibiotic duration per ID.  Patient was treated with Unasyn followed by Augmentin for  enterococcus on the abscess culture upon discharge in October 2016 (I think received total 1 month of treatment). -The source of abscess is not clear. As per surgery, when the drain output decreases they would like to do the following: #1 repeat CT scan to ensure complete drainage of the abscess #2 below drain study #3-dosed Gastrografin enema to make sure he does not have a small leak from the rectal stump. - IR on board  CAD status post CABG with cardiomyopathy Patient had NSTEMI in July 2016 with cardiogenic shock . Three-vessel disease seen on cardiac cath with EF of 30-35% He underwent CABG. Currently has some volume overload. Off fluid. Continue low-dose Lasix. Monitor strict I/O.   Abdominal pain Secondary to abscess. Improved. Continue when necessary pain meds  Stage IV sacral decubitus ulcer Appears unchanged. Wound care consult appreciated. As per surgery who had spoken with plastic surgeon Dr. Kelly Splinter he may qualify to get a surgical flap for the decubitus ulcer once nutritional status is satisfactory. This will have to be [facility] at [facility].  Anemia of chronic disease Reportedly had some blood in his colostomy . Mild drop in hemoglobin but around baseline. INR therapeutic. Resumed Coumadin.  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 . H&H stable.  resumed coumadin.  OSA Continue nighttime CPAP  Protein calorie malnutrition Nutritionist consulted. Switched nutritional supplements   Oliguria Possibly with hypovolemia and sepsis.renal fn normal. Urine output improving.  Diet: regular ( per patient's insistance) DVT prophylaxis: Therapeutic INR  Code Status: Full code Family Communication: updated wife on the phone. Disposition Plan:  Barrier to discharge: on going infection with drain in place to assist with clearing infection. ID and General surgery to assist  with disposition  recommendations.   Consultants:  CCS  PCCM  IR  ID  Procedures:  CT drainage of abdominal abscess on 12/4  Antibiotics:  IV vancomycin, Flagyl 12/2-  Cefepime 12/3--12/6  Rocephin 12/6---  HPI/Subjective: Seen and examined. Denies any symptoms. No overnight issues. Blood pressure stable.   Objective: Filed Vitals:   10/23/15 0630 10/23/15 1435  BP: 120/73 125/79  Pulse: 64 79  Temp: 97.6 F (36.4 C) 97.5 F (36.4 C)  Resp: 18 20    Intake/Output Summary (Last 24 hours) at 10/23/15 1506 Last data filed at 10/23/15 1300  Gross per 24 hour  Intake    730 ml  Output   1325 ml  Net   -595 ml   Filed Weights   10/21/15 0628 10/22/15 0508 10/23/15 0630  Weight: 113.7 kg (250 lb 10.6 oz) 112.7 kg (248 lb 7.3 oz) 113.2 kg (249 lb 9 oz)    Exam:   General: Elderly male not in distress  HEENT: Pallor present, moist mucosa, supple neck  Chest: Fair bilaterally, no rhonchi or wheeze  Cardiovascular: S1 and S2 regular, 3/6 systolic murmur, no rubs or gallop  GI: Soft, nondistended, nontender, abdominal drain in place with clean dressing with active drainage of purulent material , ileostomy bag in place with  clear stool, bowel sounds present  Musculoskeletal: Warm, trace  pitting edema bilaterally, stage IV sacral decubitus  CNS: Alert and oriented  Data Reviewed: Basic Metabolic Panel:  Recent Labs Lab 10/16/15 1718 10/17/15 0348 10/18/15 0246 10/19/15 0350 10/20/15 0330 10/23/15 0510  NA 130* 135 136 138 133*  --   K 4.5 3.6 3.8 4.2 4.8  --   CL 100* 107 109 113* 110  --   CO2 21* 20* 21* 20* 20*  --   GLUCOSE 123* 108* 93 171* 163*  --   BUN --   CREATININE 0.66 0.40* 0.41* 0.46* 0.38* 0.56*  CALCIUM 8.6* 8.4* 8.1* 8.6* 8.7*  --    Liver Function Tests:  Recent Labs Lab 10/16/15 1718 10/20/15 0330  AST 27 13*  ALT 29 14*  ALKPHOS 150* 102  BILITOT 0.5 0.2*  PROT 5.8* 5.7*  ALBUMIN 1.5* 2.0*    Recent Labs Lab  10/16/15 1718  LIPASE 18   No results for input(s): AMMONIA in the last 168 hours. CBC:  Recent Labs Lab 10/16/15 1718 10/17/15 0348 10/18/15 0246 10/19/15 0350 10/20/15 0330 10/22/15 0449  WBC 16.9* 11.6* 7.2 5.3 9.4 13.0*  NEUTROABS 14.0*  --   --   --   --   --   HGB 9.5* 8.7* 7.9* 8.0* 8.4* 9.0*  HCT 28.8* 27.1* 24.5* 25.8* 27.5* 29.3*  MCV 84.0 85.5 84.2 86.3 85.9 85.7  PLT 395 380 332 388 475* 476*   Cardiac Enzymes: No results for input(s): CKTOTAL, CKMB, CKMBINDEX, TROPONINI in the last 168 hours. BNP (last 3 results)  Recent Labs  07/28/15 0500 08/02/15 0620 08/16/15 0701  BNP 453.8* 367.7* 528.3*    ProBNP (last 3 results) No results for input(s): PROBNP in the last 8760 hours.  CBG: No results for input(s): GLUCAP in the last 168 hours.  Recent Results (from the past 240 hour(s))  Blood Culture (routine x 2)     Status: None   Collection Time: 10/16/15  5:18 PM  Result Value Ref Range Status   Specimen Description BLOOD RIGHT ARM  Final   Special Requests BOTTLES DRAWN AEROBIC AND ANAEROBIC  Final   Culture NO GROWTH 5 DAYS  Final   Report Status 10/21/2015 FINAL  Final  Blood Culture (routine x 2)     Status: None   Collection Time: 10/16/15  5:53 PM  Result Value Ref Range Status   Specimen Description BLOOD RIGHT HAND  Final   Special Requests BOTTLES DRAWN AEROBIC AND ANAEROBIC  Final   Culture NO GROWTH 5 DAYS  Final   Report Status 10/21/2015 FINAL  Final  Urine culture     Status: None   Collection Time: 10/16/15  6:40 PM  Result Value Ref Range Status   Specimen Description URINE, CLEAN CATCH  Final   Special Requests NONE  Final   Culture MULTIPLE SPECIES PRESENT, SUGGEST RECOLLECTION  Final   Report Status 10/18/2015 FINAL  Final  Culture, routine-abscess     Status: None   Collection Time: 10/17/15  3:20 PM  Result Value Ref Range Status   Specimen Description PERITONEAL ABSCESS  Final   Special Requests NONE  Final    Gram Stain   Final    ABUNDANT WBC PRESENT, PREDOMINANTLY PMN NO SQUAMOUS EPITHELIAL CELLS SEEN ABUNDANT GRAM POSITIVE COCCI IN PAIRS IN CHAINS MODERATE GRAM NEGATIVE RODS Performed at [facility]    Culture   Final    ABUNDANT ESCHERICHIA COLI Performed at [facility]    Report Status 10/20/2015 FINAL  Final   Organism ID, Bacteria ESCHERICHIA COLI  Final      Susceptibility   Escherichia coli - MIC*    AMPICILLIN <=2 SENSITIVE Sensitive     AMPICILLIN/SULBACTAM <=2 SENSITIVE Sensitive     CEFAZOLIN <=4 SENSITIVE Sensitive     CEFEPIME <=1 SENSITIVE Sensitive     CEFTAZIDIME <=1 SENSITIVE Sensitive     CEFTRIAXONE <=1 SENSITIVE Sensitive     CIPROFLOXACIN <=0.25 SENSITIVE Sensitive     GENTAMICIN <=1 SENSITIVE Sensitive     IMIPENEM <=0.25 SENSITIVE Sensitive     PIP/TAZO <=4 SENSITIVE Sensitive     TOBRAMYCIN <=1 SENSITIVE Sensitive     TRIMETH/SULFA Value in next row Sensitive      <=20 SENSITIVE(NOTE)    * ABUNDANT ESCHERICHIA COLI  Anaerobic culture     Status: None   Collection Time: 10/17/15  3:20 PM  Result Value Ref Range Status   Specimen Description PERITONEAL ABSCESS  Final   Special Requests NONE  Final   Gram Stain   Final    ABUNDANT WBC PRESENT, PREDOMINANTLY PMN NO SQUAMOUS EPITHELIAL CELLS SEEN ABUNDANT GRAM POSITIVE COCCI IN PAIRS IN CHAINS MODERATE GRAM NEGATIVE RODS Performed at [facility]    Culture   Final    NO ANAEROBES ISOLATED Performed at [facility]    Report Status 10/22/2015 FINAL  Final  MRSA PCR Screening     Status: None   Collection Time: 10/18/15  8:32 PM  Result Value Ref Range Status   MRSA by PCR NEGATIVE NEGATIVE Final    Comment:        The GeneXpert MRSA Assay (FDA approved for NASAL specimens only), is one component of a comprehensive MRSA colonization surveillance program. It is not intended to diagnose MRSA infection nor to guide or monitor treatment for MRSA infections.       Studies: No results found.  Scheduled Meds: . antiseptic oral rinse  15 mL Mouth Rinse TID PC  . cefTRIAXone (ROCEPHIN)  IV  2 g Intravenous Q24H  . furosemide  40 mg Oral Daily  .  hydrocortisone sodium succinate  50 mg Intravenous 4 times per day  . magnesium oxide  800 mg Oral BID  . metronidazole  500 mg Intravenous Q8H  . multivitamin with minerals  1 tablet Oral Daily  . nutrition supplement (JUVEN)  1 packet Oral BID BM  . pantoprazole  40 mg Oral Daily  . potassium chloride  30 mEq Oral BID  . protein supplement  2 oz Oral QID  . vancomycin  1,000 mg Intravenous Q12H  . warfarin  5 mg Oral ONCE-1800  . Warfarin - Pharmacist Dosing Inpatient   Does not apply q1800   Continuous Infusions:    Time spent: 25 minutes   Penny PiaVEGA, Keaghan Staton  [facility] Pager [phone]. If 7PM-7AM, please contact night-coverage at [email or web address], password [email or web address] 10/23/2015, 3:06 PM  LOS: 7 days

## 2015-10-23 NOTE — Progress Notes (Signed)
Patient has home CPAP unit with him in room. Patient stated he places himself on and off when ready without any assistance. RT filled water chamber and plugged unit in for patient. Will monitor as needed.

## 2015-10-23 NOTE — Clinical Social Work Note (Signed)
Clinical Social Work Assessment  Patient Details  Name: Jonathan Le MRN: [id number] Date of Birth: 1946/09/07  Date of referral:  10/20/15               Reason for consult:  Facility Placement, Discharge Planning                Permission sought to share information with:  Chartered certified accountant granted to share information::  Yes, Verbal Permission Granted  Name::        Agency::     Relationship::     Contact Information:     Housing/Transportation Living arrangements for the past 2 months:  [place], Single Family Home (CIR) Source of Information:  Patient, Facility Patient Interpreter Needed:  None Criminal Activity/Legal Involvement Pertinent to Current Situation/Hospitalization:  No - Comment as needed Significant Relationships:  Spouse, Siblings Lives with:  Spouse Do you feel safe going back to the place where you live?   (SNF recommended) Need for family participation in patient care:     Care giving concerns:  Pt has been in and out of the hospital since July following a triple bypass surgery.     Social Worker assessment / plan:  CSW met with pt and talked to his wife by phone per pt's request.  Wife explained that pt has been in the hospital consistently since August following complications from heart surgery in July.  Pt was in ICU for 3 weeks and has had now 5 abscesses.  Pt is likely scheduled for surgery within the next week.   Wife and pt both prefer that pt return home when medically stable versus returning to SNF.  Neither are interested in returning to [facility] upon discharge.  CSW explained that other facilities have extended offers.  Wife stated she would like to wait until after surgery before making a definitive discharge plan.  CSW respected wife's decision.  Wife requested information about home health services.  CSW explained that the [facility] could answer those questions and that she would have the [facility] contact her to  answer her questions.  CSW will continue to follow and assist with discharge planning needs.    Employment status:  Retired Forensic scientist:  Medicare PT Recommendations:  [place] / Referral to community resources:  [place]  Patient/Family's Response to care:  Pt and wife were appreciative of CSW assistance.  Patient/Family's Understanding of and Emotional Response to Diagnosis, Current Treatment, and Prognosis:  Pt became tearful as he expressed sadness related to being in the hospital over 100 days, missing Thanksgiving and the possibility of not being able to spend Christmas and New Years with his family.  CSW empathized with pt.  Pt and wife both are hopeful that pt will be able to return home at discharge.  Emotional Assessment Appearance:  Appears stated age Attitude/Demeanor/Rapport:  Other (cooperative) Affect (typically observed):  Pleasant, Calm Orientation:  Oriented to Self, Oriented to Place, Oriented to  Time, Oriented to Situation Alcohol / Substance use:  Not Applicable Psych involvement (Current and /or in the community):  No (Comment)  Discharge Needs  Concerns to be addressed:  Discharge Planning Concerns Readmission within the last 30 days:  No Current discharge risk:  None Barriers to Discharge:  No Barriers Identified   Naida Sleight, LCSW 10/23/2015, 1:41 PM

## 2015-10-24 ENCOUNTER — Inpatient Hospital Stay (HOSPITAL_COMMUNITY): Payer: Medicare Other

## 2015-10-24 LAB — CBC
HCT: 31.4 % — ABNORMAL LOW (ref 39.0–52.0)
HEMOGLOBIN: 9.7 g/dL — AB (ref 13.0–17.0)
MCH: 27.2 pg (ref 26.0–34.0)
MCHC: 30.9 g/dL (ref 30.0–36.0)
MCV: 88.2 fL (ref 78.0–100.0)
Platelets: 437 10*3/uL — ABNORMAL HIGH (ref 150–400)
RBC: 3.56 MIL/uL — AB (ref 4.22–5.81)
RDW: 16.8 % — ABNORMAL HIGH (ref 11.5–15.5)
WBC: 13.3 10*3/uL — AB (ref 4.0–10.5)

## 2015-10-24 LAB — PROTIME-INR
INR: 2.16 — ABNORMAL HIGH (ref 0.00–1.49)
PROTHROMBIN TIME: 23.9 s — AB (ref 11.6–15.2)

## 2015-10-24 MED ORDER — WARFARIN SODIUM 5 MG PO TABS
5.0000 mg | ORAL_TABLET | Freq: Once | ORAL | Status: AC
  Administered 2015-10-24: 5 mg via ORAL
  Filled 2015-10-24: qty 1

## 2015-10-24 NOTE — Progress Notes (Signed)
RT note: Pt. seen regarding home cpap, stated,"does on own", told to notify RN if RT needed, Rt to monitor.

## 2015-10-24 NOTE — Progress Notes (Signed)
ANTICOAGULATION CONSULT NOTE - Follow Up  Pharmacy Consult for Warfarin Indication: DVT  Allergies  Allergen Reactions  . Unasyn [Ampicillin-Sulbactam Sodium] Hives, Itching and Rash    Diffuse rash not responding to Pepcid and Benadryl.  TDD.  Marland Kitchen. Levaquin [Levofloxacin] Nausea Only  . Penicillins Hives  . Doxycycline Rash   Patient Measurements: Height: 5\' 9"  (175.3 cm) Weight: 260 lb 2.3 oz (118 kg) IBW/kg (Calculated) : 70.7  Vital Signs: Temp: 98 F (36.7 C) (12/10 0555) Temp Source: Oral (12/10 0555) BP: 116/60 mmHg (12/10 0555) Pulse Rate: 63 (12/10 0555)  Labs:  Recent Labs  10/22/15 0449 10/23/15 0510 10/24/15 0510  HGB 9.0*  --   --   HCT 29.3*  --   --   PLT 476*  --   --   LABPROT 23.7* 22.4* 23.9*  INR 2.14* 1.98* 2.16*  CREATININE  --  0.56*  --    Estimated Creatinine Clearance: 108.9 mL/min (by C-G formula based on Cr of 0.56).  Medications:  Scheduled:  . antiseptic oral rinse  15 mL Mouth Rinse TID PC  . cefTRIAXone (ROCEPHIN)  IV  2 g Intravenous Q24H  . furosemide  40 mg Oral Daily  . hydrocortisone sodium succinate  50 mg Intravenous 4 times per day  . magnesium oxide  800 mg Oral BID  . metronidazole  500 mg Intravenous Q8H  . multivitamin with minerals  1 tablet Oral Daily  . nutrition supplement (JUVEN)  1 packet Oral BID BM  . pantoprazole  40 mg Oral Daily  . potassium chloride  30 mEq Oral BID  . protein supplement  2 oz Oral QID  . vancomycin  1,000 mg Intravenous Q12H  . Warfarin - Pharmacist Dosing Inpatient   Does not apply q1800   Assessment: Patient known to pharmacy for dosing of antibiotics. Patient was on warfarin prior to admission at a dose of 5mg  q6pm for hx DVT was on hold at admission, now to restart warfarin with pharmacy dosing assisstance.  Today, 10/24/2015  INR therapeutic at 2.16 - warfarin resumed 12/6  Hgb low but stable, Plt stable (12/8)  No reported bleeding, good po intake  Drug interactions: Flagyl  can increase INR  Goal of Therapy:  INR 2-3   Plan:   Warfarin 5mg  x 1 today at 1800  Daily PT/INR  Otho BellowsGreen, Daryl Beehler L PharmD Pager [phone] 10/24/2015, 10:19 AM

## 2015-10-24 NOTE — Progress Notes (Signed)
Contrast for CT @ bedside. Pt states he does not want CT done tonight because his relatives are visiting from out of town and his dinner has been ordered. States he would like CT done tomorrow or Monday. Radiology notified.  Delford FieldGagliano, Chavez Rosol E, RN

## 2015-10-24 NOTE — Progress Notes (Signed)
  Subjective:  He is not having pain. Ileostomy is working well.  Supposed to get CT today, but I don't see it ordered.  Objective: Vital signs in last 24 hours: Temp:  [97.5 F (36.4 C)-98 F (36.7 C)] 98 F (36.7 C) (12/10 0555) Pulse Rate:  [63-79] 63 (12/10 0555) Resp:  [17-20] 18 (12/10 0555) BP: (100-125)/(53-79) 116/60 mmHg (12/10 0555) SpO2:  [97 %-100 %] 100 % (12/10 0555) Weight:  [118 kg (260 lb 2.3 oz)] 118 kg (260 lb 2.3 oz) (12/10 0500) Last BM Date: 10/23/15  Intake/Output from previous day: 12/09 0701 - 12/10 0700 In: 1250 [P.O.:480; IV Piggyback:750] Out: 1705 [Urine:800; Drains:55; Stool:850] Intake/Output this shift:    General appearance: alert, cooperative and no distress GI: soft, non-tender; bowel sounds normal; no masses,  no organomegaly and ileostomy working well, wound OK, drain unchanged.    Lab Results:   Recent Labs  10/22/15 0449  WBC 13.0*  HGB 9.0*  HCT 29.3*  PLT 476*    BMET  Recent Labs  10/23/15 0510  CREATININE 0.56*   PT/INR  Recent Labs  10/23/15 0510 10/24/15 0510  LABPROT 22.4* 23.9*  INR 1.98* 2.16*     Recent Labs Lab 10/20/15 0330  AST 13*  ALT 14*  ALKPHOS 102  BILITOT 0.2*  PROT 5.7*  ALBUMIN 2.0*     Lipase     Component Value Date/Time   LIPASE 18 10/16/2015 1718     Studies/Results: No results found.  Medications: . antiseptic oral rinse  15 mL Mouth Rinse TID PC  . cefTRIAXone (ROCEPHIN)  IV  2 g Intravenous Q24H  . furosemide  40 mg Oral Daily  . hydrocortisone sodium succinate  50 mg Intravenous 4 times per day  . magnesium oxide  800 mg Oral BID  . metronidazole  500 mg Intravenous Q8H  . multivitamin with minerals  1 tablet Oral Daily  . nutrition supplement (JUVEN)  1 packet Oral BID BM  . pantoprazole  40 mg Oral Daily  . potassium chloride  30 mEq Oral BID  . protein supplement  2 oz Oral QID  . vancomycin  1,000 mg Intravenous Q12H  . warfarin  5 mg Oral ONCE-1800  .  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 Dwain SarnaWakefield, closure of abdominal wall, wound vac and ileostomy - 06/20/15, Dr. Donell BeersByerly; after CABG 05/22/15, Dr. Kathlee NationsPeter Van Trigt. S/p hospitalized 10/13-11/11/16 for intraabdominal abscess drainage, possible stump breakdown. IR drain placement 10/17/15 OSA with CPAP Sacral decubitus - currently clean and looks good - will be eligible for flap when his prealbumin is >16 per Dr. Kelly SplinterSanger Chronic respiratory failure - resolved  Chronic systolic CHF (EF 16%25%)  Hypertension Atrial fibrillation Malnutrition - Debility Antibiotics: Vancomycin Day 7, Flagyl Day 9, now on Rocephin day 3 (Day 8 of cephalosporins)  Abx management per DR. Comer/ID. DVT: on coumadin (INR 2.14)   PLAN:  Continue antibiotics, he is eating well, working on nutrition.  WBC was up 2 days ago.  No fevers.  I will repeat CBC today and if it is still elevated, will plan to get CT, drain injection and Gastografin enema when drainage gets lower.  Hopefully early next week.      LOS: 8 days    Carime Dinkel C. 10/24/2015

## 2015-10-24 NOTE — Progress Notes (Signed)
[facility] PROGRESS NOTE  KEYGAN DUMOND UJW:[id number] DOB: 01/30/1946 DOA: 10/16/2015 PCP: Elizabeth Palau, FNP   Brief narrative 69 year old male with medical history of CAD status post CABG in July 2016, CHF with EF of 30-35 %, hypertension and history of DVT on Coumadin, obstructive sleep apnea on CPAP, glaucoma, stage 4 sacral decubitus, development of ischemic colitis in August 2016 for which he underwent excoriated a laparotomy with total colectomy and ileostomy with prolonged intubation requiring tracheostomy, followed by a complicated course with multiple abdominal and pelvic abscesses that have been drained. His last CT scan from 09/18/2015 showed fluid collection to have resolved and drains were removed. Patient was then sent to [facility]. For the last 2 weeks he was having vague abdominal pain with occasional fever. He was found to have a UTI and was started on antibiotics. However for the past 2 days he was having low blood pressure. He was sent for outpatient CT scan of the abdomen which showed a large central abdominal abscess measuring [id number] cm and was sent to the ED. Patient was found to be in septic shock with fever of 102.60F, tachycardic, hypotensive (blood pressure 83/37 mmHg) and significant leukocytosis (16.9 K). Patient admitted to stepdown unit on empiric antibiotics. General surgery consulted . Patient had drainage of his abdominal abscess by IR on 12/3.   Assessment/Plan: Recurrent Intra-abdominal abscess with sepsis/septic shock -Sepsis now resolved. -CT drainage of the abdominal abscess by IR on 12/3.Marland Kitchen Continuous significant output from the drain. Cultures growing Escherichia coli which is pansensitive. Continued vancomycin given gram-positive cocci on Gram stain. Narrowed antibiotic to ceftriaxone and Flagyl.  - ID following. Total course of antibiotic duration per ID.  Patient was treated with Unasyn followed by Augmentin for  enterococcus on the abscess culture upon discharge in October 2016 (I think received total 1 month of treatment). -The source of abscess is not clear. As per surgery, when the drain output decreases they would like to do the following: #1 repeat CT scan to ensure complete drainage of the abscess #2 below drain study #3-dosed Gastrografin enema to make sure he does not have a small leak from the rectal stump. - IR on board  CAD status post CABG with cardiomyopathy Patient had NSTEMI in July 2016 with cardiogenic shock . Three-vessel disease seen on cardiac cath with EF of 30-35% He underwent CABG. Currently has some volume overload. Off fluid. Continue low-dose Lasix. Monitor strict I/O.   Abdominal pain Secondary to abscess. Improved. Continue when necessary pain meds  Stage IV sacral decubitus ulcer Appears unchanged. Wound care consult appreciated. As per surgery who spoke with plastic surgeon Dr. Kelly Splinter he may qualify to get a surgical flap for the decubitus ulcer once nutritional status is satisfactory. This will have to be done at [facility]  Anemia of chronic disease Reportedly had some blood in his colostomy . Mild drop in hemoglobin but around baseline. INR therapeutic. Resumed Coumadin.  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 . H&H stable.  resumed coumadin.  OSA Continue nighttime CPAP  Protein calorie malnutrition Nutritionist consulted. Switched nutritional supplements   Oliguria Possibly with hypovolemia and sepsis.renal fn normal. Urine output improving.  Diet: regular ( per patient's insistance) DVT prophylaxis: Therapeutic INR  Code Status: Full code Family Communication: updated wife on the phone. Disposition Plan:  Barrier to discharge: on going infection with drain in place to assist with clearing infection. ID and General surgery to assist with  disposition  recommendations.   Consultants:  CCS  PCCM  IR  ID  Procedures:  CT drainage of abdominal abscess on 12/4  Antibiotics:  IV vancomycin, Flagyl 12/2-  Cefepime 12/3--12/6  Rocephin 12/6---  HPI/Subjective: No new complaints. No acute issues overnight.  Objective: Filed Vitals:   10/24/15 1000 10/24/15 1319  BP: 100/57 111/66  Pulse: 77 68  Temp: 97.6 F (36.4 C) 97.9 F (36.6 C)  Resp: 18 16    Intake/Output Summary (Last 24 hours) at 10/24/15 1518 Last data filed at 10/24/15 1300  Gross per 24 hour  Intake   1020 ml  Output   1255 ml  Net   -235 ml   Filed Weights   10/22/15 0508 10/23/15 0630 10/24/15 0500  Weight: 112.7 kg (248 lb 7.3 oz) 113.2 kg (249 lb 9 oz) 118 kg (260 lb 2.3 oz)    Exam:   General: Elderly male not in distress  HEENT: Pallor present, moist mucosa, supple neck  Chest: Fair bilaterally, no rhonchi or wheeze  Cardiovascular: S1 and S2 regular, 3/6 systolic murmur, no rubs or gallop  GI: Soft, nondistended, nontender, abdominal drain in place with clean dressing with active drainage of purulent material , ileostomy bag in place with  clear stool, bowel sounds present  Musculoskeletal: Warm, trace  pitting edema bilaterally, stage IV sacral decubitus  CNS: Alert and oriented  Data Reviewed: Basic Metabolic Panel:  Recent Labs Lab 10/18/15 0246 10/19/15 0350 10/20/15 0330 10/23/15 0510  NA 136 138 133*  --   K 3.8 4.2 4.8  --   CL 109 113* 110  --   CO2 21* 20* 20*  --   GLUCOSE 93 171* 163*  --   BUN 9 11 17   --   CREATININE 0.41* 0.46* 0.38* 0.56*  CALCIUM 8.1* 8.6* 8.7*  --    Liver Function Tests:  Recent Labs Lab 10/20/15 0330  AST 13*  ALT 14*  ALKPHOS 102  BILITOT 0.2*  PROT 5.7*  ALBUMIN 2.0*   No results for input(s): LIPASE, AMYLASE in the last 168 hours. No results for input(s): AMMONIA in the last 168 hours. CBC:  Recent Labs Lab 10/18/15 0246 10/19/15 0350 10/20/15 0330  10/22/15 0449 10/24/15 1222  WBC 7.2 5.3 9.4 13.0* 13.3*  HGB 7.9* 8.0* 8.4* 9.0* 9.7*  HCT 24.5* 25.8* 27.5* 29.3* 31.4*  MCV 84.2 86.3 85.9 85.7 88.2  PLT 332 388 475* 476* 437*   Cardiac Enzymes: No results for input(s): CKTOTAL, CKMB, CKMBINDEX, TROPONINI in the last 168 hours. BNP (last 3 results)  Recent Labs  07/28/15 0500 08/02/15 0620 08/16/15 0701  BNP 453.8* 367.7* 528.3*    ProBNP (last 3 results) No results for input(s): PROBNP in the last 8760 hours.  CBG: No results for input(s): GLUCAP in the last 168 hours.  Recent Results (from the past 240 hour(s))  Blood Culture (routine x 2)     Status: None   Collection Time: 10/16/15  5:18 PM  Result Value Ref Range Status   Specimen Description BLOOD RIGHT ARM  Final   Special Requests BOTTLES DRAWN AEROBIC AND ANAEROBIC  Final   Culture NO GROWTH 5 DAYS  Final   Report Status 10/21/2015 FINAL  Final  Blood Culture (routine x 2)     Status: None   Collection Time: 10/16/15  5:53 PM  Result Value Ref Range Status   Specimen Description BLOOD RIGHT HAND  Final   Special Requests BOTTLES DRAWN AEROBIC  AND ANAEROBIC  Final   Culture NO GROWTH 5 DAYS  Final   Report Status 10/21/2015 FINAL  Final  Urine culture     Status: None   Collection Time: 10/16/15  6:40 PM  Result Value Ref Range Status   Specimen Description URINE, CLEAN CATCH  Final   Special Requests NONE  Final   Culture MULTIPLE SPECIES PRESENT, SUGGEST RECOLLECTION  Final   Report Status 10/18/2015 FINAL  Final  Culture, routine-abscess     Status: None   Collection Time: 10/17/15  3:20 PM  Result Value Ref Range Status   Specimen Description PERITONEAL ABSCESS  Final   Special Requests NONE  Final   Gram Stain   Final    ABUNDANT WBC PRESENT, PREDOMINANTLY PMN NO SQUAMOUS EPITHELIAL CELLS SEEN ABUNDANT GRAM POSITIVE COCCI IN PAIRS IN CHAINS MODERATE GRAM NEGATIVE RODS Performed at [facility]    Culture   Final     ABUNDANT ESCHERICHIA COLI Performed at [facility]    Report Status 10/20/2015 FINAL  Final   Organism ID, Bacteria ESCHERICHIA COLI  Final      Susceptibility   Escherichia coli - MIC*    AMPICILLIN <=2 SENSITIVE Sensitive     AMPICILLIN/SULBACTAM <=2 SENSITIVE Sensitive     CEFAZOLIN <=4 SENSITIVE Sensitive     CEFEPIME <=1 SENSITIVE Sensitive     CEFTAZIDIME <=1 SENSITIVE Sensitive     CEFTRIAXONE <=1 SENSITIVE Sensitive     CIPROFLOXACIN <=0.25 SENSITIVE Sensitive     GENTAMICIN <=1 SENSITIVE Sensitive     IMIPENEM <=0.25 SENSITIVE Sensitive     PIP/TAZO <=4 SENSITIVE Sensitive     TOBRAMYCIN <=1 SENSITIVE Sensitive     TRIMETH/SULFA Value in next row Sensitive      <=20 SENSITIVE(NOTE)    * ABUNDANT ESCHERICHIA COLI  Anaerobic culture     Status: None   Collection Time: 10/17/15  3:20 PM  Result Value Ref Range Status   Specimen Description PERITONEAL ABSCESS  Final   Special Requests NONE  Final   Gram Stain   Final    ABUNDANT WBC PRESENT, PREDOMINANTLY PMN NO SQUAMOUS EPITHELIAL CELLS SEEN ABUNDANT GRAM POSITIVE COCCI IN PAIRS IN CHAINS MODERATE GRAM NEGATIVE RODS Performed at [facility]    Culture   Final    NO ANAEROBES ISOLATED Performed at [facility]    Report Status 10/22/2015 FINAL  Final  MRSA PCR Screening     Status: None   Collection Time: 10/18/15  8:32 PM  Result Value Ref Range Status   MRSA by PCR NEGATIVE NEGATIVE Final    Comment:        The GeneXpert MRSA Assay (FDA approved for NASAL specimens only), is one component of a comprehensive MRSA colonization surveillance program. It is not intended to diagnose MRSA infection nor to guide or monitor treatment for MRSA infections.      Studies: No results found.  Scheduled Meds: . antiseptic oral rinse  15 mL Mouth Rinse TID PC  . cefTRIAXone (ROCEPHIN)  IV  2 g Intravenous Q24H  . furosemide  40 mg Oral Daily  . hydrocortisone sodium succinate  50 mg  Intravenous 4 times per day  . magnesium oxide  800 mg Oral BID  . metronidazole  500 mg Intravenous Q8H  . multivitamin with minerals  1 tablet Oral Daily  . nutrition supplement (JUVEN)  1 packet Oral BID BM  . pantoprazole  40 mg Oral Daily  .  potassium chloride  30 mEq Oral BID  . protein supplement  2 oz Oral QID  . vancomycin  1,000 mg Intravenous Q12H  . warfarin  5 mg Oral ONCE-1800  . Warfarin - Pharmacist Dosing Inpatient   Does not apply q1800   Continuous Infusions:    Time spent: 25 minutes   Penny PiaVEGA, Maurina Fawaz  [facility] Pager [phone]. If 7PM-7AM, please contact night-coverage at [email or web address], password [email or web address] 10/24/2015, 3:18 PM  LOS: 8 days

## 2015-10-25 ENCOUNTER — Inpatient Hospital Stay (HOSPITAL_COMMUNITY): Payer: Medicare Other

## 2015-10-25 ENCOUNTER — Encounter (HOSPITAL_COMMUNITY): Payer: Self-pay | Admitting: Radiology

## 2015-10-25 LAB — PROTIME-INR
INR: 2.3 — ABNORMAL HIGH (ref 0.00–1.49)
Prothrombin Time: 25 seconds — ABNORMAL HIGH (ref 11.6–15.2)

## 2015-10-25 MED ORDER — IOHEXOL 300 MG/ML  SOLN
100.0000 mL | Freq: Once | INTRAMUSCULAR | Status: AC | PRN
  Administered 2015-10-25: 100 mL via INTRAVENOUS

## 2015-10-25 MED ORDER — IOHEXOL 300 MG/ML  SOLN
50.0000 mL | Freq: Once | INTRAMUSCULAR | Status: AC | PRN
  Administered 2015-10-25: 50 mL via ORAL

## 2015-10-25 MED ORDER — WARFARIN SODIUM 5 MG PO TABS
5.0000 mg | ORAL_TABLET | Freq: Once | ORAL | Status: AC
  Administered 2015-10-25: 5 mg via ORAL
  Filled 2015-10-25: qty 1

## 2015-10-25 NOTE — Progress Notes (Signed)
  Subjective:  He is not having pain. Ileostomy is working well.  Trying hard to get good PO protein intake  Objective: Vital signs in last 24 hours: Temp:  [97.9 F (36.6 C)-98.1 F (36.7 C)] 98.1 F (36.7 C) (12/11 0553) Pulse Rate:  [65-68] 66 (12/11 0553) Resp:  [16-18] 18 (12/11 0553) BP: (111-132)/(66-82) 132/82 mmHg (12/11 0553) SpO2:  [97 %-99 %] 99 % (12/11 0553) Weight:  [117.6 kg (259 lb 4.2 oz)] 117.6 kg (259 lb 4.2 oz) (12/11 0615) Last BM Date: 10/24/15 (ostomy)  Intake/Output from previous day: 12/10 0701 - 12/11 0700 In: 2230 [P.O.:1920; IV Piggyback:300] Out: 2797.5 [Urine:1375; Drains:47.5; Stool:1375] Intake/Output this shift: Total I/O In: 300 [IV Piggyback:300] Out: -   General appearance: alert, cooperative and no distress GI: soft, non-tender; bowel sounds normal; no masses,  no organomegaly and ileostomy working well, wound OK, drain unchanged.    Lab Results:   Recent Labs  10/24/15 1222  WBC 13.3*  HGB 9.7*  HCT 31.4*  PLT 437*    BMET  Recent Labs  10/23/15 0510  CREATININE 0.56*   PT/INR  Recent Labs  10/24/15 0510 10/25/15 0533  LABPROT 23.9* 25.0*  INR 2.16* 2.30*     Recent Labs Lab 10/20/15 0330  AST 13*  ALT 14*  ALKPHOS 102  BILITOT 0.2*  PROT 5.7*  ALBUMIN 2.0*     Lipase     Component Value Date/Time   LIPASE 18 10/16/2015 1718     Studies/Results: No results found.  Medications: . antiseptic oral rinse  15 mL Mouth Rinse TID PC  . cefTRIAXone (ROCEPHIN)  IV  2 g Intravenous Q24H  . furosemide  40 mg Oral Daily  . hydrocortisone sodium succinate  50 mg Intravenous 4 times per day  . magnesium oxide  800 mg Oral BID  . metronidazole  500 mg Intravenous Q8H  . multivitamin with minerals  1 tablet Oral Daily  . nutrition supplement (JUVEN)  1 packet Oral BID BM  . pantoprazole  40 mg Oral Daily  . potassium chloride  30 mEq Oral BID  . protein supplement  2 oz Oral QID  . vancomycin  1,000 mg  Intravenous Q12H  . warfarin  5 mg Oral ONCE-1800  . 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 Dwain SarnaWakefield, closure of abdominal wall, wound vac and ileostomy - 06/20/15, Dr. Donell BeersByerly; after CABG 05/22/15, Dr. Kathlee NationsPeter Van Trigt. S/p hospitalized 10/13-11/11/16 for intraabdominal abscess drainage, possible stump breakdown. IR drain placement 10/17/15 OSA with CPAP Sacral decubitus - currently clean and looks good - will be eligible for flap when his prealbumin is >16 per Dr. Kelly SplinterSanger Chronic respiratory failure - resolved  Chronic systolic CHF (EF 16%25%)  Hypertension Atrial fibrillation- pt anticoagulated  Malnutrition - Debility Antibiotics:  Abx management per DR. Comer/ID. DVT: on coumadin (INR theraputic)   PLAN:  Continue antibiotics, he is eating well, working on nutrition.  WBC is still up.  No fevers.  I will repeat a CT today, drain injection should be performed prior to removing current drain to eval for open rectal stump     LOS: 9 days    Jonathan Mclelland C. 10/25/2015

## 2015-10-25 NOTE — Progress Notes (Signed)
ANTICOAGULATION CONSULT NOTE - Follow Up  Pharmacy Consult for Warfarin Indication: DVT  Allergies  Allergen Reactions  . Unasyn [Ampicillin-Sulbactam Sodium] Hives, Itching and Rash    Diffuse rash not responding to Pepcid and Benadryl.  TDD.  Marland Kitchen. Levaquin [Levofloxacin] Nausea Only  . Penicillins Hives  . Doxycycline Rash   Patient Measurements: Height: 5\' 9"  (175.3 cm) Weight: 259 lb 4.2 oz (117.6 kg) IBW/kg (Calculated) : 70.7  Vital Signs: Temp: 98.1 F (36.7 C) (12/11 0553) Temp Source: Oral (12/11 0553) BP: 132/82 mmHg (12/11 0553) Pulse Rate: 66 (12/11 0553)  Labs:  Recent Labs  10/23/15 0510 10/24/15 0510 10/24/15 1222 10/25/15 0533  HGB  --   --  9.7*  --   HCT  --   --  31.4*  --   PLT  --   --  437*  --   LABPROT 22.4* 23.9*  --  25.0*  INR 1.98* 2.16*  --  2.30*  CREATININE 0.56*  --   --   --    Estimated Creatinine Clearance: 108.8 mL/min (by C-G formula based on Cr of 0.56).  Medications:  Scheduled:  . antiseptic oral rinse  15 mL Mouth Rinse TID PC  . cefTRIAXone (ROCEPHIN)  IV  2 g Intravenous Q24H  . furosemide  40 mg Oral Daily  . hydrocortisone sodium succinate  50 mg Intravenous 4 times per day  . magnesium oxide  800 mg Oral BID  . metronidazole  500 mg Intravenous Q8H  . multivitamin with minerals  1 tablet Oral Daily  . nutrition supplement (JUVEN)  1 packet Oral BID BM  . pantoprazole  40 mg Oral Daily  . potassium chloride  30 mEq Oral BID  . protein supplement  2 oz Oral QID  . vancomycin  1,000 mg Intravenous Q12H  . Warfarin - Pharmacist Dosing Inpatient   Does not apply q1800   Assessment: Patient known to pharmacy for dosing of antibiotics. Patient was on warfarin prior to admission at a dose of 5mg  q6pm for hx DVT was on hold at admission, now to restart warfarin with pharmacy dosing assisstance.  Today, 10/25/2015  INR therapeutic at 2.30 - warfarin resumed 12/6  Hgb low but stable, Plt stable (12/10)  No reported  bleeding, good po intake  Drug interactions: Day 9 of Flagyl: can increase INR  Goal of Therapy:  INR 2-3   Plan:   Warfarin 5mg  x 1 today at 1800  Daily PT/INR  Otho BellowsGreen, Domique Reardon L PharmD Pager [phone] 10/25/2015, 8:23 AM

## 2015-10-25 NOTE — Progress Notes (Signed)
[facility] PROGRESS NOTE  Jonathan Le ZHY:[id number] DOB: September 15, 1946 DOA: 10/16/2015 PCP: Elizabeth Palau, FNP   Brief narrative 69 year old male with medical history of CAD status post CABG in July 2016, CHF with EF of 30-35 %, hypertension and history of DVT on Coumadin, obstructive sleep apnea on CPAP, glaucoma, stage 4 sacral decubitus, development of ischemic colitis in August 2016 for which he underwent excoriated a laparotomy with total colectomy and ileostomy with prolonged intubation requiring tracheostomy, followed by a complicated course with multiple abdominal and pelvic abscesses that have been drained. His last CT scan from 09/18/2015 showed fluid collection to have resolved and drains were removed. Patient was then sent to [facility]. For the last 2 weeks he was having vague abdominal pain with occasional fever. He was found to have a UTI and was started on antibiotics. However for the past 2 days he was having low blood pressure. He was sent for outpatient CT scan of the abdomen which showed a large central abdominal abscess measuring 161021 cm and was sent to the ED. Patient was found to be in septic shock with fever of 102.110F, tachycardic, hypotensive (blood pressure 83/37 mmHg) and significant leukocytosis (16.9 K). Patient admitted to stepdown unit on empiric antibiotics. General surgery consulted . Patient had drainage of his abdominal abscess by IR on 12/3.   Assessment/Plan: Recurrent Intra-abdominal abscess with sepsis/septic shock -Sepsis now resolved. -CT drainage of the abdominal abscess by IR on 12/3.Marland Kitchen Continuous significant output from the drain. Cultures growing Escherichia coli which is pansensitive. Continued vancomycin given gram-positive cocci on Gram stain. Narrowed antibiotic to ceftriaxone and Flagyl.  - ID following. Total course of antibiotic duration per ID.  Patient was treated with Unasyn followed by Augmentin for  enterococcus on the abscess culture upon discharge in October 2016 (I think received total 1 month of treatment). -The source of abscess is not clear. As per surgery, when the drain output decreases they would like to do the following: #1 repeat CT scan to ensure complete drainage of the abscess #2 below drain study #3-dosed Gastrografin enema to make sure he does not have a small leak from the rectal stump. - IR on board  CAD status post CABG with cardiomyopathy Patient had NSTEMI in July 2016 with cardiogenic shock . Three-vessel disease seen on cardiac cath with EF of 30-35% He underwent CABG. Currently has some volume overload. Off fluid. On low-dose Lasix. Monitor strict I/O.   Abdominal pain Secondary to abscess. Improved.  - Continue supportive therapy  Stage IV sacral decubitus ulcer Wound care consult appreciated. As per surgery who spoke with plastic surgeon Dr. Kelly Splinter he may qualify to get a surgical flap for the decubitus ulcer once nutritional status is satisfactory. This will have to be done at [facility]. Patient currently working on eating a high protein diet.  Anemia of chronic disease Reportedly had some blood in his colostomy . Mild drop in hemoglobin but around baseline. INR therapeutic. Resumed Coumadin.  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 . H&H stable.  resumed coumadin.  OSA Continue nighttime CPAP  Protein calorie malnutrition Nutritionist consulted. Switched nutritional supplements   Diet: regular ( per patient's insistance) DVT prophylaxis: Therapeutic INR  Code Status: Full code Family Communication: updated wife on the phone. Disposition Plan:  Barrier to discharge: on going infection with drain in place to assist with clearing infection. ID and General surgery to assist with disposition recommendations.   Consultants:  CCS  PCCM  IR  ID  Procedures:  CT drainage of abdominal abscess on  12/4  Antibiotics:  IV vancomycin, Flagyl 12/2-  Cefepime 12/3--12/6  Rocephin 12/6---  HPI/Subjective: No new complaints. No acute issues overnight.  Objective: Filed Vitals:   10/24/15 2103 10/25/15 0553  BP: 119/76 132/82  Pulse: 65 66  Temp: 98 F (36.7 C) 98.1 F (36.7 C)  Resp: 18 18    Intake/Output Summary (Last 24 hours) at 10/25/15 1327 Last data filed at 10/25/15 1100  Gross per 24 hour  Intake   2160 ml  Output 3187.5 ml  Net -1027.5 ml   Filed Weights   10/23/15 0630 10/24/15 0500 10/25/15 0615  Weight: 113.2 kg (249 lb 9 oz) 118 kg (260 lb 2.3 oz) 117.6 kg (259 lb 4.2 oz)    Exam:   General: Elderly male not in distress  HEENT: Pallor present, moist mucosa, supple neck  Chest: Fair bilaterally, no rhonchi or wheeze  Cardiovascular: S1 and S2 regular, 3/6 systolic murmur, no rubs or gallop  GI: Soft, nondistended, nontender, abdominal drain in place with clean dressing with active drainage of purulent material , ileostomy bag in place with  clear stool, bowel sounds present  Musculoskeletal: Warm, trace  pitting edema bilaterally, stage IV sacral decubitus  CNS: Alert and oriented  Data Reviewed: Basic Metabolic Panel:  Recent Labs Lab 10/19/15 0350 10/20/15 0330 10/23/15 0510  NA 138 133*  --   K 4.2 4.8  --   CL 113* 110  --   CO2 20* 20*  --   GLUCOSE 171* 163*  --   BUN 11 17  --   CREATININE 0.46* 0.38* 0.56*  CALCIUM 8.6* 8.7*  --    Liver Function Tests:  Recent Labs Lab 10/20/15 0330  AST 13*  ALT 14*  ALKPHOS 102  BILITOT 0.2*  PROT 5.7*  ALBUMIN 2.0*   No results for input(s): LIPASE, AMYLASE in the last 168 hours. No results for input(s): AMMONIA in the last 168 hours. CBC:  Recent Labs Lab 10/19/15 0350 10/20/15 0330 10/22/15 0449 10/24/15 1222  WBC 5.3 9.4 13.0* 13.3*  HGB 8.0* 8.4* 9.0* 9.7*  HCT 25.8* 27.5* 29.3* 31.4*  MCV 86.3 85.9 85.7 88.2  PLT 388 475* 476* 437*   Cardiac Enzymes: No  results for input(s): CKTOTAL, CKMB, CKMBINDEX, TROPONINI in the last 168 hours. BNP (last 3 results)  Recent Labs  07/28/15 0500 08/02/15 0620 08/16/15 0701  BNP 453.8* 367.7* 528.3*    ProBNP (last 3 results) No results for input(s): PROBNP in the last 8760 hours.  CBG: No results for input(s): GLUCAP in the last 168 hours.  Recent Results (from the past 240 hour(s))  Blood Culture (routine x 2)     Status: None   Collection Time: 10/16/15  5:18 PM  Result Value Ref Range Status   Specimen Description BLOOD RIGHT ARM  Final   Special Requests BOTTLES DRAWN AEROBIC AND ANAEROBIC 5ML  Final   Culture NO GROWTH 5 DAYS  Final   Report Status 10/21/2015 FINAL  Final  Blood Culture (routine x 2)     Status: None   Collection Time: 10/16/15  5:53 PM  Result Value Ref Range Status   Specimen Description BLOOD RIGHT HAND  Final   Special Requests BOTTLES DRAWN AEROBIC AND ANAEROBIC 5ML  Final   Culture NO GROWTH 5 DAYS  Final   Report Status 10/21/2015 FINAL  Final  Urine culture  Status: None   Collection Time: 10/16/15  6:40 PM  Result Value Ref Range Status   Specimen Description URINE, CLEAN CATCH  Final   Special Requests NONE  Final   Culture MULTIPLE SPECIES PRESENT, SUGGEST RECOLLECTION  Final   Report Status 10/18/2015 FINAL  Final  Culture, routine-abscess     Status: None   Collection Time: 10/17/15  3:20 PM  Result Value Ref Range Status   Specimen Description PERITONEAL ABSCESS  Final   Special Requests NONE  Final   Gram Stain   Final    ABUNDANT WBC PRESENT, PREDOMINANTLY PMN NO SQUAMOUS EPITHELIAL CELLS SEEN ABUNDANT GRAM POSITIVE COCCI IN PAIRS IN CHAINS MODERATE GRAM NEGATIVE RODS Performed at [facility]    Culture   Final    ABUNDANT ESCHERICHIA COLI Performed at [facility]    Report Status 10/20/2015 FINAL  Final   Organism ID, Bacteria ESCHERICHIA COLI  Final      Susceptibility   Escherichia coli - MIC*    AMPICILLIN <=2  SENSITIVE Sensitive     AMPICILLIN/SULBACTAM <=2 SENSITIVE Sensitive     CEFAZOLIN <=4 SENSITIVE Sensitive     CEFEPIME <=1 SENSITIVE Sensitive     CEFTAZIDIME <=1 SENSITIVE Sensitive     CEFTRIAXONE <=1 SENSITIVE Sensitive     CIPROFLOXACIN <=0.25 SENSITIVE Sensitive     GENTAMICIN <=1 SENSITIVE Sensitive     IMIPENEM <=0.25 SENSITIVE Sensitive     PIP/TAZO <=4 SENSITIVE Sensitive     TOBRAMYCIN <=1 SENSITIVE Sensitive     TRIMETH/SULFA Value in next row Sensitive      <=20 SENSITIVE(NOTE)    * ABUNDANT ESCHERICHIA COLI  Anaerobic culture     Status: None   Collection Time: 10/17/15  3:20 PM  Result Value Ref Range Status   Specimen Description PERITONEAL ABSCESS  Final   Special Requests NONE  Final   Gram Stain   Final    ABUNDANT WBC PRESENT, PREDOMINANTLY PMN NO SQUAMOUS EPITHELIAL CELLS SEEN ABUNDANT GRAM POSITIVE COCCI IN PAIRS IN CHAINS MODERATE GRAM NEGATIVE RODS Performed at [facility]    Culture   Final    NO ANAEROBES ISOLATED Performed at [facility]    Report Status 10/22/2015 FINAL  Final  MRSA PCR Screening     Status: None   Collection Time: 10/18/15  8:32 PM  Result Value Ref Range Status   MRSA by PCR NEGATIVE NEGATIVE Final    Comment:        The GeneXpert MRSA Assay (FDA approved for NASAL specimens only), is one component of a comprehensive MRSA colonization surveillance program. It is not intended to diagnose MRSA infection nor to guide or monitor treatment for MRSA infections.      Studies: No results found.  Scheduled Meds: . antiseptic oral rinse  15 mL Mouth Rinse TID PC  . cefTRIAXone (ROCEPHIN)  IV  2 g Intravenous Q24H  . furosemide  40 mg Oral Daily  . hydrocortisone sodium succinate  50 mg Intravenous 4 times per day  . magnesium oxide  800 mg Oral BID  . metronidazole  500 mg Intravenous Q8H  . multivitamin with minerals  1 tablet Oral Daily  . nutrition supplement (JUVEN)  1 packet Oral BID BM  .  pantoprazole  40 mg Oral Daily  . potassium chloride  30 mEq Oral BID  . protein supplement  2 oz Oral QID  . vancomycin  1,000 mg Intravenous Q12H  . warfarin  5  mg Oral ONCE-1800  . Warfarin - Pharmacist Dosing Inpatient   Does not apply q1800   Continuous Infusions:    Time spent: 25 minutes   Penny Pia  [facility] Pager [phone]. If 7PM-7AM, please contact night-coverage at [email or web address], password [facility] 10/25/2015, 1:27 PM  LOS: 9 days

## 2015-10-25 NOTE — Progress Notes (Signed)
RT note: Pt. seen, puts self on home cpap normally around midnite, told to notify if needed.

## 2015-10-26 ENCOUNTER — Inpatient Hospital Stay (HOSPITAL_COMMUNITY): Payer: Medicare Other

## 2015-10-26 LAB — PROTIME-INR
INR: 2.57 — AB (ref 0.00–1.49)
PROTHROMBIN TIME: 27.2 s — AB (ref 11.6–15.2)

## 2015-10-26 LAB — CBC
HCT: 30.5 % — ABNORMAL LOW (ref 39.0–52.0)
HEMOGLOBIN: 9.5 g/dL — AB (ref 13.0–17.0)
MCH: 27.7 pg (ref 26.0–34.0)
MCHC: 31.1 g/dL (ref 30.0–36.0)
MCV: 88.9 fL (ref 78.0–100.0)
Platelets: 367 10*3/uL (ref 150–400)
RBC: 3.43 MIL/uL — ABNORMAL LOW (ref 4.22–5.81)
RDW: 18.4 % — ABNORMAL HIGH (ref 11.5–15.5)
WBC: 11.6 10*3/uL — ABNORMAL HIGH (ref 4.0–10.5)

## 2015-10-26 LAB — VANCOMYCIN, TROUGH: VANCOMYCIN TR: 19 ug/mL (ref 10.0–20.0)

## 2015-10-26 MED ORDER — IOHEXOL 300 MG/ML  SOLN
15.0000 mL | Freq: Once | INTRAMUSCULAR | Status: AC | PRN
  Administered 2015-10-26: 15 mL

## 2015-10-26 MED ORDER — VANCOMYCIN HCL IN DEXTROSE 750-5 MG/150ML-% IV SOLN
750.0000 mg | Freq: Two times a day (BID) | INTRAVENOUS | Status: DC
  Administered 2015-10-26 – 2015-10-29 (×6): 750 mg via INTRAVENOUS
  Filled 2015-10-26 (×8): qty 150

## 2015-10-26 MED ORDER — IOHEXOL 300 MG/ML  SOLN
300.0000 mL | Freq: Once | INTRAMUSCULAR | Status: AC | PRN
  Administered 2015-10-26: 300 mL

## 2015-10-26 MED ORDER — WARFARIN SODIUM 2.5 MG PO TABS
2.5000 mg | ORAL_TABLET | Freq: Once | ORAL | Status: AC
  Administered 2015-10-26: 2.5 mg via ORAL
  Filled 2015-10-26: qty 1

## 2015-10-26 NOTE — Progress Notes (Signed)
Patient ID: Jonathan Le, male   DOB: 23-May-1946, 69 y.o.   MRN: [id number]    Subjective: Pt awoke from sleep.  No complaints.  No abdominal pain.  hungry  Objective: Vital signs in last 24 hours: Temp:  [97.3 F (36.3 C)-97.8 F (36.6 C)] 97.6 F (36.4 C) (12/12 0451) Pulse Rate:  [97-99] 97 (12/12 0451) Resp:  [20] 20 (12/12 0451) BP: (120-140)/(68-86) 120/68 mmHg (12/12 0451) SpO2:  [97 %-98 %] 97 % (12/12 0451) Weight:  [117.1 kg (258 lb 2.5 oz)] 117.1 kg (258 lb 2.5 oz) (12/12 0451) Last BM Date: 10/25/15  Intake/Output from previous day: 12/11 0701 - 12/12 0700 In: 1690 [P.O.:840; IV Piggyback:800] Out: 1970 [Urine:800; Drains:20; Stool:1150] Intake/Output this shift:    PE: Abd: soft, NT, ND, +BS, obese, JP drain with clear watery type output  Lab Results:   Recent Labs  10/24/15 1222 10/26/15 0715  WBC 13.3* 11.6*  HGB 9.7* 9.5*  HCT 31.4* 30.5*  PLT 437* 367   BMET No results for input(s): NA, K, CL, CO2, GLUCOSE, BUN, CREATININE, CALCIUM in the last 72 hours. PT/INR  Recent Labs  10/25/15 0533 10/26/15 0715  LABPROT 25.0* 27.2*  INR 2.30* 2.57*   CMP     Component Value Date/Time   NA 133* 10/20/2015 0330   K 4.8 10/20/2015 0330   CL 110 10/20/2015 0330   CO2 20* 10/20/2015 0330   GLUCOSE 163* 10/20/2015 0330   BUN 17 10/20/2015 0330   CREATININE 0.56* 10/23/2015 0510   CALCIUM 8.7* 10/20/2015 0330   PROT 5.7* 10/20/2015 0330   ALBUMIN 2.0* 10/20/2015 0330   AST 13* 10/20/2015 0330   ALT 14* 10/20/2015 0330   ALKPHOS 102 10/20/2015 0330   BILITOT 0.2* 10/20/2015 0330   GFRNONAA >60 10/23/2015 0510   GFRAA >60 10/23/2015 0510   Lipase     Component Value Date/Time   LIPASE 18 10/16/2015 1718       Studies/Results: Ct Abdomen Pelvis W Contrast  10/25/2015  CLINICAL DATA:  69 year old with subtotal colectomy and ileostomy due to ischemic colitis 06/18/2015, subsequent hospitalization in October for abdominal abscess drainage  and possible stump breakdown, percutaneous drainage of a large intra-abdominal abscess 10/17/2015, now with persistent leukocytosis. Patient also has a sacral decubitus ulcer. EXAM: CT ABDOMEN AND PELVIS WITH CONTRAST TECHNIQUE: Multidetector CT imaging of the abdomen and pelvis was performed using the standard protocol following bolus administration of intravenous contrast. CONTRAST:  OMNIPAQUE IOHEXOL 300 MG/ML IV. Oral contrast was also administered. COMPARISON:  Multiple prior CT abdomen and pelvis examinations dating back to 06/18/2015, most recently 10/17/2015 at the time of drainage catheter placement. FINDINGS: Lower chest: Bilateral pleural effusions, right greater than left, with associated passive atelectasis in the lower lobes, increased since the 10/16/2015 examination. Calcified granuloma again noted in the left lower lobe. Heart enlarged with extensive 3 vessel coronary atherosclerosis. Hepatobiliary: Liver normal in size. Relative enlargement of the left lobe and caudate lobe compared to the right lobe. Gallbladder contracted, accounting for the mucosal enhancement. No calcified gallstones. No biliary ductal dilation. Pancreas: Normal in appearance without evidence of mass, ductal dilation, or inflammation. Spleen: Normal in size and appearance. Adrenals/Urinary Tract: Normal appearing adrenal glands. Nonobstructing bilateral renal calculi as noted previously. Kidneys otherwise normal in appearance. Normal-appearing urinary bladder. Stomach/Bowel: Stomach normal in appearance for the degree of distention. Wall thickening and edema involving several loops of what are likely mid and distal jejunum. Small extraluminal gas bubbles adjacent to an  abnormal loop in the left upper pelvis. Remainder of the small bowel normal in appearance. Normal-appearing ileostomy in the right periumbilical region. Fluid-filled Hartmann pouch as noted previously. Right paramedian sacral decubitus ulcer.  Vascular/Lymphatic: Mild to moderate aortoiliofemoral atherosclerosis without aneurysm. Widely patent visceral arteries. 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 Reproductive: Moderate to marked prostate gland enlargement, unchanged. Other: Edema involving the small bowel mesentery. Previously identified abscess completely drained, with no residual fluid collection. No new abscess. Severe generalized body wall edema. Musculoskeletal: Degenerative disc disease, spondylosis and facet degenerative changes diffusely throughout the lumbar spine with severe multifactorial spinal stenosis at L2-3 and L4-5 and moderate multifactorial spinal stenosis at L3-4. Bilateral hip joint effusions. IMPRESSION: 1. No evidence of residual abdominal abscess after percutaneous drainage. No new abscess elsewhere. 2. Wall thickening/edema involving a several cm segment of mid and distal jejunum, associated with edema in the small bowel mesentery. Extraluminal gas bubbles in the left upper pelvis are in proximity to one of these abnormal bowel loops and may indicate microperforation. 3. Satisfactory appearance of the ileostomy in the right upper pelvis. 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 5. Bilateral pleural effusions, right greater than left, with associated passive atelectasis in the lower lobes. 6. Anasarca. 7. Moderate to marked prostate gland enlargement. 8. Osseous findings as above, with multilevel multifactorial spinal stenosis. 9. Right paramedian sacral decubitus ulcer with possible osteomyelitis involving the last coccygeal segment. 10. Bilateral hip joint effusions. Electronically Signed   By: Hulan Saashomas  Lawrence M.D.   On: 10/25/2015 15:50    Anti-infectives: Anti-infectives    Start     Dose/Rate Route Frequency Ordered Stop   10/26/15 2000  vancomycin (VANCOCIN) IVPB 750 mg/150 ml premix     750 mg 150 mL/hr over 60 Minutes Intravenous Every 12 hours 10/26/15  0835     10/20/15 2000  vancomycin (VANCOCIN) IVPB 1000 mg/200 mL premix     1,000 mg 200 mL/hr over 60 Minutes Intravenous Every 12 hours 10/20/15 1606 10/26/15 1200   10/20/15 1630  metroNIDAZOLE (FLAGYL) IVPB 500 mg    Comments:  Pharmacy may adjust   500 mg 100 mL/hr over 60 Minutes Intravenous Every 8 hours 10/20/15 1541     10/20/15 1330  cefTRIAXone (ROCEPHIN) 2 g in dextrose 5 % 50 mL IVPB     2 g 100 mL/hr over 30 Minutes Intravenous Every 24 hours 10/20/15 1304     10/18/15 0400  ceFEPIme (MAXIPIME) 2 g in dextrose 5 % 50 mL IVPB  Status:  Discontinued     2 g 100 mL/hr over 30 Minutes Intravenous 3 times per day 10/18/15 0314 10/20/15 1304   10/17/15 1730  cefTRIAXone (ROCEPHIN) 1 g in dextrose 5 % 50 mL IVPB  Status:  Discontinued     1 g 100 mL/hr over 30 Minutes Intravenous Every 24 hours 10/16/15 2146 10/17/15 0008   10/17/15 1000  cefTRIAXone (ROCEPHIN) 2 g in dextrose 5 % 50 mL IVPB  Status:  Discontinued     2 g 100 mL/hr over 30 Minutes Intravenous Every 24 hours 10/17/15 0008 10/18/15 0236   10/17/15 0800  vancomycin (VANCOCIN) IVPB 1000 mg/200 mL premix  Status:  Discontinued     1,000 mg 200 mL/hr over 60 Minutes Intravenous Every 12 hours 10/16/15 1932 10/20/15 1303   10/17/15 0130  metroNIDAZOLE (FLAGYL) IVPB 500 mg  Status:  Discontinued     500 mg 100 mL/hr over 60 Minutes Intravenous Every 8  hours 10/16/15 2147 10/20/15 1304   10/16/15 1730  metroNIDAZOLE (FLAGYL) IVPB 500 mg     500 mg 100 mL/hr over 60 Minutes Intravenous  Once 10/16/15 1721 10/16/15 1923   10/16/15 1730  cefTRIAXone (ROCEPHIN) 1 g in dextrose 5 % 50 mL IVPB     1 g 100 mL/hr over 30 Minutes Intravenous  Once 10/16/15 1721 10/16/15 1902   10/16/15 1730  vancomycin (VANCOCIN) 2,000 mg in sodium chloride 0.9 % 500 mL IVPB     2,000 mg 250 mL/hr over 120 Minutes Intravenous  Once 10/16/15 1727 10/16/15 2222       Assessment/Plan  S/p total abdominal colectomy with ileostomy secondary  to ischemic colitis, 06/18/15, Dr. Dwain Sarna, closure of abdominal wall, wound vac and ileostomy - 06/20/15, Dr. Donell Beers; after CABG 05/22/15, Dr. Kathlee Nations Trigt. S/p hospitalized in [facility] 10/13-11/11/16 for intraabdominal abscess drainage, possible stump breakdown. IR drain placement 10/17/15 for new abdominal abscess -will have IR study drain today to determine if the patient has a fistula from his drain to his rectal stump.  If he does not, prior to pulling his drain, we would like to obtain a gastrografin enema to rule out a rectal stump leak as the ongoing source of intra-abdominal fluid collection development.  If the drain study confirms a fistula, then obvious his drain would need to remain in place indefinitely. -cont abx therapy -cont coumadin and diet, does not need to be NPO for this Sacral decubitus - currently clean and looks good - will be eligible for flap when his prealbumin is >16 per Dr. Sarin [place]unale Splinter Chronic respiratory failure - resolved  Chronic systolic CHF (EF 16%)  Hypertension Atrial fibrillation- pt anticoagulated  Malnutrition - Debility Antibiotics: Abx management per DR. Comer/ID. DVT: on coumadin (INR theraputic)   LOS: 10 days    Areatha Kalata E 10/26/2015, 9:20 AM Pager: [phone]

## 2015-10-26 NOTE — Progress Notes (Signed)
PT Cancellation Note  Patient Details Name: Jonathan Le MRN: [id number] DOB: 08/09/1946   Cancelled Treatment:    Reason Eval/Treat Not Completed: Patient at procedure or test/unavailable. Will check back as schedule allows.   Rada HayHill, Nalla Purdy Elizabeth 10/26/2015, 11:31 AM Blanchard KelchKaren Delorice Bannister PT [phone]

## 2015-10-26 NOTE — Progress Notes (Signed)
Pt has home CPAP and prefers self placement

## 2015-10-26 NOTE — Progress Notes (Signed)
Nutrition Follow-up  DOCUMENTATION CODES:   Non-severe (moderate) malnutrition in context of chronic illness, Obesity unspecified  INTERVENTION:  - Continue Prostat once/day, Juven BID, and Unjury QID - RD will continue to monitor for needs  NUTRITION DIAGNOSIS:   Malnutrition related to chronic illness as evidenced by percent weight loss, moderate depletion of body fat, mild depletion of muscle mass. -ongoing  GOAL:   Patient will meet greater than or equal to 90% of their needs -variably met  MONITOR:   PO intake, Supplement acceptance, Labs, Weight trends, Skin, I & O's  REASON FOR ASSESSMENT:   Consult Assessment of nutrition requirement/status  ASSESSMENT:   69 year old male with medical history of CAD status post CABG in July 2016, CHF with EF of 30-35 %, hypertension and history of DVT on Coumadin, obstructive sleep apnea on CPAP, glaucoma, development of ischemic colitis in August 2016 for which he underwent excoriated a laparotomy with total colectomy and ileostomy with prolonged intubation requiring tracheostomy, followed by a complicated course with multiple abdominal and pelvic abscesses that have been drained.    12/12 Per chart review, pt has been eating 60-10%, mainly 100%, of meals since 12/8. Pt states that he had a late breakfast of fruit, orange juice, a ham omelet, and coffee. He has not yet ordered lunch. He states that he has a good appetite and feels that he is eating more than he previously was. Pt does not like Prostat but does like Unjury and Juven; will keep supplement orders the same at this time as pt states he is able to tolerate it.   Encouraged pt to focus on protein-rich foods for wound healing. Meeting needs on average with meals and supplements. Medications reviewed. Labs reviewed; Na: 133 mmol/L, creatinine low.   12/7 - RD consulted for wound healing - increased protein intake. - Pt was seen by RD yesterday, currently receiving two packets  of Juven and 1 Pro-Stat/day - No PO intake logged since 12/3. Pt reports he has been eating well.  12/6 - Patient reports eating fairly well but states that he has had poor appetite d/t his medical issues and multiple hospitalizations since the summer.  - Pt has multiple wounds and has had to have an abdominal abscess drained.  - Patient continues to lose weight, 73 lb since January 2016 (23% weight loss x 11 months, significant for time frame).  - Suspect exact weight amounts to be masked by fluid.  - Patient states he is drinking his Prostat but he hates the taste. - RD to have patient try Juven, as he might like the taste better.  - Will order BID and will decrease Prostat order to once daily. - Nutrition-Focused physical exam completed.  - Findings are mild-moderate fat depletion, mild muscle depletion, and moderate edema.    Diet Order:  Diet regular Room service appropriate?: Yes; Fluid consistency:: Thin  Skin:   Stage 4 pressure ulcer to sacrum, Stage 2 pressure ulcers to bilateral heels  Last BM:  12/11  Height:   Ht Readings from Last 1 Encounters:  10/16/15 $RemoveB'5\' 9"'[id number]$  (1.753 m)    Weight:   Wt Readings from Last 1 Encounters:  10/26/15 258 lb 2.5 oz (117.1 kg)    Ideal Body Weight:  72.7 kg  BMI:  Body mass index is 38.11 kg/(m^2).  Estimated Nutritional Needs:   Kcal:  2050-2250  Protein:  130-150g  Fluid:  2.1L/day  EDUCATION NEEDS:   No education needs identified at this time  Theodoro Koval, RD, LDN Inpatient Clinical Dietitian Pager # [phone] After hours/weekend pager # [phone]  

## 2015-10-26 NOTE — Progress Notes (Signed)
[facility] PROGRESS NOTE  Jonathan Le ZHY:[id number] DOB: September 15, 1946 DOA: 10/16/2015 PCP: Elizabeth Palau, FNP   Brief narrative 69 year old male with medical history of CAD status post CABG in July 2016, CHF with EF of 30-35 %, hypertension and history of DVT on Coumadin, obstructive sleep apnea on CPAP, glaucoma, stage 4 sacral decubitus, development of ischemic colitis in August 2016 for which he underwent excoriated a laparotomy with total colectomy and ileostomy with prolonged intubation requiring tracheostomy, followed by a complicated course with multiple abdominal and pelvic abscesses that have been drained. His last CT scan from 09/18/2015 showed fluid collection to have resolved and drains were removed. Patient was then sent to [facility]. For the last 2 weeks he was having vague abdominal pain with occasional fever. He was found to have a UTI and was started on antibiotics. However for the past 2 days he was having low blood pressure. He was sent for outpatient CT scan of the abdomen which showed a large central abdominal abscess measuring 161021 cm and was sent to the ED. Patient was found to be in septic shock with fever of 102.110F, tachycardic, hypotensive (blood pressure 83/37 mmHg) and significant leukocytosis (16.9 K). Patient admitted to stepdown unit on empiric antibiotics. General surgery consulted . Patient had drainage of his abdominal abscess by IR on 12/3.   Assessment/Plan: Recurrent Intra-abdominal abscess with sepsis/septic shock -Sepsis now resolved. -CT drainage of the abdominal abscess by IR on 12/3.Marland Kitchen Continuous significant output from the drain. Cultures growing Escherichia coli which is pansensitive. Continued vancomycin given gram-positive cocci on Gram stain. Narrowed antibiotic to ceftriaxone and Flagyl.  - ID following. Total course of antibiotic duration per ID.  Patient was treated with Unasyn followed by Augmentin for  enterococcus on the abscess culture upon discharge in October 2016 (I think received total 1 month of treatment). -The source of abscess is not clear. As per surgery, when the drain output decreases they would like to do the following: #1 repeat CT scan to ensure complete drainage of the abscess #2 below drain study #3-dosed Gastrografin enema to make sure he does not have a small leak from the rectal stump. - IR on board  CAD status post CABG with cardiomyopathy Patient had NSTEMI in July 2016 with cardiogenic shock . Three-vessel disease seen on cardiac cath with EF of 30-35% He underwent CABG. Currently has some volume overload. Off fluid. On low-dose Lasix. Monitor strict I/O.   Abdominal pain Secondary to abscess. Improved.  - Continue supportive therapy  Stage IV sacral decubitus ulcer Wound care consult appreciated. As per surgery who spoke with plastic surgeon Dr. Kelly Splinter he may qualify to get a surgical flap for the decubitus ulcer once nutritional status is satisfactory. This will have to be done at [facility]. Patient currently working on eating a high protein diet.  Anemia of chronic disease Reportedly had some blood in his colostomy . Mild drop in hemoglobin but around baseline. INR therapeutic. Resumed Coumadin.  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 . H&H stable.  resumed coumadin.  OSA Continue nighttime CPAP  Protein calorie malnutrition Nutritionist consulted. Switched nutritional supplements   Diet: regular ( per patient's insistance) DVT prophylaxis: Therapeutic INR  Code Status: Full code Family Communication: updated wife on the phone. Disposition Plan:  Barrier to discharge: on going infection with drain in place to assist with clearing infection. ID and General surgery to assist with disposition recommendations.   Consultants:  CCS  PCCM  IR  ID  Procedures:  CT drainage of abdominal abscess on  12/4  Antibiotics:  IV vancomycin, Flagyl 12/2-  Cefepime 12/3--12/6  Rocephin 12/6---  HPI/Subjective: No new complaints. No acute issues overnight.  Objective: Filed Vitals:   10/25/15 2000 10/26/15 0451  BP: 133/86 120/68  Pulse: 97 97  Temp: 97.3 F (36.3 C) 97.6 F (36.4 C)  Resp: 20 20    Intake/Output Summary (Last 24 hours) at 10/26/15 1508 Last data filed at 10/26/15 1217  Gross per 24 hour  Intake    800 ml  Output   1520 ml  Net   -720 ml   Filed Weights   10/24/15 0500 10/25/15 0615 10/26/15 0451  Weight: 118 kg (260 lb 2.3 oz) 117.6 kg (259 lb 4.2 oz) 117.1 kg (258 lb 2.5 oz)    Exam:   General: Elderly male not in distress  HEENT: Pallor present, moist mucosa, supple neck  Chest: Fair bilaterally, no rhonchi or wheeze  Cardiovascular: S1 and S2 regular, 3/6 systolic murmur, no rubs or gallop  GI: Soft, nondistended, nontender, abdominal drain in place with clean dressing with active drainage of purulent material , ileostomy bag in place with  clear stool, bowel sounds present  Musculoskeletal: Warm, trace  pitting edema bilaterally, stage IV sacral decubitus  CNS: Alert and oriented  Data Reviewed: Basic Metabolic Panel:  Recent Labs Lab 10/20/15 0330 10/23/15 0510  NA 133*  --   K 4.8  --   CL 110  --   CO2 20*  --   GLUCOSE 163*  --   BUN 17  --   CREATININE 0.38* 0.56*  CALCIUM 8.7*  --    Liver Function Tests:  Recent Labs Lab 10/20/15 0330  AST 13*  ALT 14*  ALKPHOS 102  BILITOT 0.2*  PROT 5.7*  ALBUMIN 2.0*   No results for input(s): LIPASE, AMYLASE in the last 168 hours. No results for input(s): AMMONIA in the last 168 hours. CBC:  Recent Labs Lab 10/20/15 0330 10/22/15 0449 10/24/15 1222 10/26/15 0715  WBC 9.4 13.0* 13.3* 11.6*  HGB 8.4* 9.0* 9.7* 9.5*  HCT 27.5* 29.3* 31.4* 30.5*  MCV 85.9 85.7 88.2 88.9  PLT 475* 476* 437* 367   Cardiac Enzymes: No results for input(s): CKTOTAL, CKMB, CKMBINDEX,  TROPONINI in the last 168 hours. BNP (last 3 results)  Recent Labs  07/28/15 0500 08/02/15 0620 08/16/15 0701  BNP 453.8* 367.7* 528.3*    ProBNP (last 3 results) No results for input(s): PROBNP in the last 8760 hours.  CBG: No results for input(s): GLUCAP in the last 168 hours.  Recent Results (from the past 240 hour(s))  Blood Culture (routine x 2)     Status: None   Collection Time: 10/16/15  5:18 PM  Result Value Ref Range Status   Specimen Description BLOOD RIGHT ARM  Final   Special Requests BOTTLES DRAWN AEROBIC AND ANAEROBIC  Final   Culture NO GROWTH 5 DAYS  Final   Report Status 10/21/2015 FINAL  Final  Blood Culture (routine x 2)     Status: None   Collection Time: 10/16/15  5:53 PM  Result Value Ref Range Status   Specimen Description BLOOD RIGHT HAND  Final   Special Requests BOTTLES DRAWN AEROBIC AND ANAEROBIC  Final   Culture NO GROWTH 5 DAYS  Final   Report Status 10/21/2015 FINAL  Final  Urine culture     Status: None  Collection Time: 10/16/15  6:40 PM  Result Value Ref Range Status   Specimen Description URINE, CLEAN CATCH  Final   Special Requests NONE  Final   Culture MULTIPLE SPECIES PRESENT, SUGGEST RECOLLECTION  Final   Report Status 10/18/2015 FINAL  Final  Culture, routine-abscess     Status: None   Collection Time: 10/17/15  3:20 PM  Result Value Ref Range Status   Specimen Description PERITONEAL ABSCESS  Final   Special Requests NONE  Final   Gram Stain   Final    ABUNDANT WBC PRESENT, PREDOMINANTLY PMN NO SQUAMOUS EPITHELIAL CELLS SEEN ABUNDANT GRAM POSITIVE COCCI IN PAIRS IN CHAINS MODERATE GRAM NEGATIVE RODS Performed at [facility]    Culture   Final    ABUNDANT ESCHERICHIA COLI Performed at [facility]    Report Status 10/20/2015 FINAL  Final   Organism ID, Bacteria ESCHERICHIA COLI  Final      Susceptibility   Escherichia coli - MIC*    AMPICILLIN <=2 SENSITIVE Sensitive     AMPICILLIN/SULBACTAM  <=2 SENSITIVE Sensitive     CEFAZOLIN <=4 SENSITIVE Sensitive     CEFEPIME <=1 SENSITIVE Sensitive     CEFTAZIDIME <=1 SENSITIVE Sensitive     CEFTRIAXONE <=1 SENSITIVE Sensitive     CIPROFLOXACIN <=0.25 SENSITIVE Sensitive     GENTAMICIN <=1 SENSITIVE Sensitive     IMIPENEM <=0.25 SENSITIVE Sensitive     PIP/TAZO <=4 SENSITIVE Sensitive     TOBRAMYCIN <=1 SENSITIVE Sensitive     TRIMETH/SULFA Value in next row Sensitive      <=20 SENSITIVE(NOTE)    * ABUNDANT ESCHERICHIA COLI  Anaerobic culture     Status: None   Collection Time: 10/17/15  3:20 PM  Result Value Ref Range Status   Specimen Description PERITONEAL ABSCESS  Final   Special Requests NONE  Final   Gram Stain   Final    ABUNDANT WBC PRESENT, PREDOMINANTLY PMN NO SQUAMOUS EPITHELIAL CELLS SEEN ABUNDANT GRAM POSITIVE COCCI IN PAIRS IN CHAINS MODERATE GRAM NEGATIVE RODS Performed at [facility]    Culture   Final    NO ANAEROBES ISOLATED Performed at [facility]    Report Status 10/22/2015 FINAL  Final  MRSA PCR Screening     Status: None   Collection Time: 10/18/15  8:32 PM  Result Value Ref Range Status   MRSA by PCR NEGATIVE NEGATIVE Final    Comment:        The GeneXpert MRSA Assay (FDA approved for NASAL specimens only), is one component of a comprehensive MRSA colonization surveillance program. It is not intended to diagnose MRSA infection nor to guide or monitor treatment for MRSA infections.      Studies: Ct Abdomen Pelvis W Contrast  10/25/2015  CLINICAL DATA:  69 year old with subtotal colectomy and ileostomy due to ischemic colitis 06/18/2015, subsequent hospitalization in October for abdominal abscess drainage and possible stump breakdown, percutaneous drainage of a large intra-abdominal abscess 10/17/2015, now with persistent leukocytosis. Patient also has a sacral decubitus ulcer. EXAM: CT ABDOMEN AND PELVIS WITH CONTRAST TECHNIQUE: Multidetector CT imaging of the abdomen and  pelvis was performed using the standard protocol following bolus administration of intravenous contrast. CONTRAST:  OMNIPAQUE IOHEXOL 300 MG/ML IV. Oral contrast was also administered. COMPARISON:  Multiple prior CT abdomen and pelvis examinations dating back to 06/18/2015, most recently 10/17/2015 at the time of drainage catheter placement. FINDINGS: Lower chest: Bilateral pleural effusions, right greater than left, with associated passive  atelectasis in the lower lobes, increased since the 10/16/2015 examination. Calcified granuloma again noted in the left lower lobe. Heart enlarged with extensive 3 vessel coronary atherosclerosis. Hepatobiliary: Liver normal in size. Relative enlargement of the left lobe and caudate lobe compared to the right lobe. Gallbladder contracted, accounting for the mucosal enhancement. No calcified gallstones. No biliary ductal dilation. Pancreas: Normal in appearance without evidence of mass, ductal dilation, or inflammation. Spleen: Normal in size and appearance. Adrenals/Urinary Tract: Normal appearing adrenal glands. Nonobstructing bilateral renal calculi as noted previously. Kidneys otherwise normal in appearance. Normal-appearing urinary bladder. Stomach/Bowel: Stomach normal in appearance for the degree of distention. Wall thickening and edema involving several loops of what are likely mid and distal jejunum. Small extraluminal gas bubbles adjacent to an abnormal loop in the left upper pelvis. Remainder of the small bowel normal in appearance. Normal-appearing ileostomy in the right periumbilical region. Fluid-filled Hartmann pouch as noted previously. Right paramedian sacral decubitus ulcer. Vascular/Lymphatic: Mild to moderate aortoiliofemoral atherosclerosis without aneurysm. Widely patent visceral arteries. 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 Reproductive: Moderate to marked prostate gland enlargement, unchanged. Other: Edema involving the  small bowel mesentery. Previously identified abscess completely drained, with no residual fluid collection. No new abscess. Severe generalized body wall edema. Musculoskeletal: Degenerative disc disease, spondylosis and facet degenerative changes diffusely throughout the lumbar spine with severe multifactorial spinal stenosis at L2-3 and L4-5 and moderate multifactorial spinal stenosis at L3-4. Bilateral hip joint effusions. IMPRESSION: 1. No evidence of residual abdominal abscess after percutaneous drainage. No new abscess elsewhere. 2. Wall thickening/edema involving a several cm segment of mid and distal jejunum, associated with edema in the small bowel mesentery. Extraluminal gas bubbles in the left upper pelvis are in proximity to one of these abnormal bowel loops and may indicate microperforation. 3. Satisfactory appearance of the ileostomy in the right upper pelvis. 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 5. Bilateral pleural effusions, right greater than left, with associated passive atelectasis in the lower lobes. 6. Anasarca. 7. Moderate to marked prostate gland enlargement. 8. Osseous findings as above, with multilevel multifactorial spinal stenosis. 9. Right paramedian sacral decubitus ulcer with possible osteomyelitis involving the last coccygeal segment. 10. Bilateral hip joint effusions. Electronically Signed   By: Hulan Saas M.D.   On: 10/25/2015 15:50   Ir Sinus/fist Tube Chk-non Gi  10/26/2015  CLINICAL DATA:  History of partial colectomy for ischemic colitis complicated by development of multiple postoperative abdominal fluid collections / abscesses and several ultrasound and fluoroscopic guided percutaneous drainage catheter placements. Patient recently underwent a CT-guided abdominal fluid collection percutaneous drainage catheter placement on 10/17/2015. Subsequent CT scan performed 10/25/2015 demonstrates complete resolution of previously drained  intra-abdominal abscess. As such, patient presents today for fluoroscopic guided percutaneous drainage catheter injection. EXAM: SINUS TRACT INJECTION/FISTULOGRAM COMPARISON:  CT abdomen pelvis -10/25/2015; 08/10/2015; 07/29/2015; 07/21/2015; CT-guided abdominal abscess drainage catheter placement- 10/17/2015; 07/21/2015; ultrasound-guided left abdominal wall percutaneous drainage catheter placement - 08/12/2015 CONTRAST:  15 cc Omnipaque 300 FLUOROSCOPY TIME:  1 minute, 12 seconds (65 mGy) TECHNIQUE: The patient was positioned supine on the fluoroscopy table. A preprocedural spot fluoroscopic image was obtained of the midline of the abdomen existing solitary percutaneous drainage catheter. Multiple spot fluoroscopic and radiographic images were obtained in various obliquities following the injection of a small amount of contrast. Images were reviewed and the drainage catheter was flushed with a small amount of saline and reconnected to a JP bulb. A dressing was placed. The  patient tolerated the procedure well without immediate postprocedural complication. FINDINGS: Preprocedural spot fluoroscopic image demonstrates unchanged positioning of the solitary existing percutaneous drainage catheter with end coiled and locked over the midline of the abdomen. Contrast injection demonstrates opacification of the cheek of breast abscess cavity but was negative for definitive communication with any adjacent loop of large or small intestine. IMPRESSION: Contrast injection of the existing percutaneous drainage catheter is negative for the presence of a fistulous connection to an adjacent loop of large or small bowel. PLAN: Above findings were discussed with referring surgical p.a., Barnetta ChapelKelly Osborne. CCS wants the patient to undergo a contrast-enhanced enema to further evaluate for anastomotic leak prior to removal of the percutaneous drainage catheter. If the contrast-enhanced enema is also negative for fistulous connection to the  residual percutaneous drainage catheter, the drainage catheter may be removed at the patient's bedside as per the recommendations of CCS. Electronically Signed   By: Simonne ComeJohn  Watts M.D.   On: 10/26/2015 13:46    Scheduled Meds: . antiseptic oral rinse  15 mL Mouth Rinse TID PC  . cefTRIAXone (ROCEPHIN)  IV  2 g Intravenous Q24H  . furosemide  40 mg Oral Daily  . hydrocortisone sodium succinate  50 mg Intravenous 4 times per day  . magnesium oxide  800 mg Oral BID  . metronidazole  500 mg Intravenous Q8H  . multivitamin with minerals  1 tablet Oral Daily  . nutrition supplement (JUVEN)  1 packet Oral BID BM  . pantoprazole  40 mg Oral Daily  . potassium chloride  30 mEq Oral BID  . protein supplement  2 oz Oral QID  . vancomycin  750 mg Intravenous Q12H  . warfarin  2.5 mg Oral ONCE-1800  . Warfarin - Pharmacist Dosing Inpatient   Does not apply q1800   Continuous Infusions:    Time spent: 25 minutes   Penny PiaVEGA, Kyia Rhude  [facility] Pager [phone]. If 7PM-7AM, please contact night-coverage at [email or web address], password [email or web address] 10/26/2015, 3:08 PM  LOS: 10 days

## 2015-10-26 NOTE — Progress Notes (Signed)
ANTICOAGULATION CONSULT NOTE - Follow Up  Pharmacy Consult for Warfarin Indication: DVT  Allergies  Allergen Reactions  . Unasyn [Ampicillin-Sulbactam Sodium] Hives, Itching and Rash    Diffuse rash not responding to Pepcid and Benadryl.  TDD.  Marland Kitchen. Levaquin [Levofloxacin] Nausea Only  . Penicillins Hives  . Doxycycline Rash   Patient Measurements: Height: 5\' 9"  (175.3 cm) Weight: 258 lb 2.5 oz (117.1 kg) IBW/kg (Calculated) : 70.7  Vital Signs: Temp: 97.6 F (36.4 C) (12/12 0451) Temp Source: Oral (12/12 0451) BP: 120/68 mmHg (12/12 0451) Pulse Rate: 97 (12/12 0451)  Labs:  Recent Labs  10/24/15 0510 10/24/15 1222 10/25/15 0533 10/26/15 0715  HGB  --  9.7*  --   --   HCT  --  31.4*  --   --   PLT  --  437*  --   --   LABPROT 23.9*  --  25.0* 27.2*  INR 2.16*  --  2.30* 2.57*   Estimated Creatinine Clearance: 108.5 mL/min (by C-G formula based on Cr of 0.56).  Medications:  Scheduled:  . antiseptic oral rinse  15 mL Mouth Rinse TID PC  . cefTRIAXone (ROCEPHIN)  IV  2 g Intravenous Q24H  . furosemide  40 mg Oral Daily  . hydrocortisone sodium succinate  50 mg Intravenous 4 times per day  . magnesium oxide  800 mg Oral BID  . metronidazole  500 mg Intravenous Q8H  . multivitamin with minerals  1 tablet Oral Daily  . nutrition supplement (JUVEN)  1 packet Oral BID BM  . pantoprazole  40 mg Oral Daily  . potassium chloride  30 mEq Oral BID  . protein supplement  2 oz Oral QID  . vancomycin  1,000 mg Intravenous Q12H  . Warfarin - Pharmacist Dosing Inpatient   Does not apply q1800   Assessment: Patient known to pharmacy for dosing of antibiotics. Patient was on warfarin prior to admission at a dose of 5mg  q6pm for hx DVT was on hold at admission, now to restart warfarin with pharmacy dosing assisstance.  Today, 10/26/2015  INR therapeutic - warfarin resumed 12/6  Hgb low but stable, Plt stable (12/10)  No reported bleeding, good po intake  Drug  interactions: Flagyl: can increase INR  Goal of Therapy:  INR 2-3   Plan:   Warfarin 2.5mg  x 1 today at 1800 (reduce dose tonight for INR trending up)  Daily PT/INR  [name], PharmD, BCPS.   Pager: [phone] 10/26/2015 8:22 AM

## 2015-10-26 NOTE — Progress Notes (Signed)
Pharmacy Antibiotic Note  Jonathan Le is a 69 y.o. year-old male admitted on 10/16/2015.  On 12/9, Vancomycin, Cefepime & Metronidazole were d/c'ed and patient placed on Ceftriaxone 2gm IV q24h.  ID consulted who recommended continuing Ceftriaxone and adding back the Metronidazole and Vancomycin.  Vancomycin was continued for GPC in pairs seen on gram stain.  IAI with multiple abdominal abscesses with positive Ecoli culture  Assessment: Intra-abdominal abscess - on ceftriaxone and flagyl along with vancomycin. No changes for now (per ID) Still with drainage and plan per surgery/IR for CT next week.   12/6 Vanc trough = 17 mcg/ml   12/12 Vanc trough = 2419mcg/ml  Vancomycin trough goal of 10-15 mcg/ml acceptable for intra-abdominal infection  Plan: Day #10 antibiotics  Vancomycin trough trending up, change to vancomycin 750mg  IV q12h  Continue Ceftriaxone & Metronidazole per MD   Temp (24hrs), Avg:97.6 F (36.4 C), Min:97.3 F (36.3 C), Max:97.8 F (36.6 C)   Recent Labs Lab 10/20/15 0330 10/22/15 0449 10/24/15 1222  WBC 9.4 13.0* 13.3*     Recent Labs Lab 10/20/15 0330 10/23/15 0510  CREATININE 0.38* 0.56*   Estimated Creatinine Clearance: 108.5 mL/min (by C-G formula based on Cr of 0.56).   Antimicrobial allergies: Unasyn, PCN and doxy  12/3 >> rocephin >> 12/4 12/3 >> flagyl >>  12/3 >> vancomycin >> 12/4 >> cefepime >>12/6 12/6 >> ceftriaxone >>   Levels/dose changes this admission: 12/6 @ 19:00 Vanc Trough = 17 mcg/ml - cont regimen  12/2 blood:NGTD 12/2 urine: multiple species-final 12/3 gram stain (peritoneal abscess) : abundant GPC in pairs, moderate GNR 12/3 peritoneal abscess: abundant Ecoli - pansensitive, abundant GPC  [name], PharmD, BCPS.   Pager: [phone] 10/26/2015 8:11 AM

## 2015-10-26 NOTE — Progress Notes (Signed)
[facility]   Reason for visit: Follow up on intra-abdominal abscess  Interval History:  CT improved, no abscess noted; pt currently in radiology  Physical Exam: Constitutional:  Filed Vitals:   10/25/15 2000 10/26/15 0451  BP: 133/86 120/68  Pulse: 97 97  Temp: 97.3 F (36.3 C) 97.6 F (36.4 C)  Resp: 20 20   patient appears in NAD Respiratory: Normal respiratory effort; CTA B Cardiovascular: RRR  Review of Systems: Constitutional: negative for chills and malaise Gastrointestinal: negative for nausea and diarrhea  Lab Results  Component Value Date   WBC 11.6* 10/26/2015   HGB 9.5* 10/26/2015   HCT 30.5* 10/26/2015   MCV 88.9 10/26/2015   PLT 367 10/26/2015    Lab Results  Component Value Date   CREATININE 0.56* 10/23/2015   BUN 17 10/20/2015   NA 133* 10/20/2015   K 4.8 10/20/2015   CL 110 10/20/2015   CO2 20* 10/20/2015    Lab Results  Component Value Date   ALT 14* 10/20/2015   AST 13* 10/20/2015   ALKPHOS 102 10/20/2015     Microbiology: Recent Results (from the past 240 hour(s))  Blood Culture (routine x 2)     Status: None   Collection Time: 10/16/15  5:18 PM  Result Value Ref Range Status   Specimen Description BLOOD RIGHT ARM  Final   Special Requests BOTTLES DRAWN AEROBIC AND ANAEROBIC 5ML  Final   Culture NO GROWTH 5 DAYS  Final   Report Status 10/21/2015 FINAL  Final  Blood Culture (routine x 2)     Status: None   Collection Time: 10/16/15  5:53 PM  Result Value Ref Range Status   Specimen Description BLOOD RIGHT HAND  Final   Special Requests BOTTLES DRAWN AEROBIC AND ANAEROBIC 5ML  Final   Culture NO GROWTH 5 DAYS  Final   Report Status 10/21/2015 FINAL  Final  Urine culture     Status: None   Collection Time: 10/16/15  6:40 PM  Result Value Ref Range Status   Specimen Description URINE, CLEAN CATCH  Final   Special Requests NONE  Final   Culture MULTIPLE SPECIES PRESENT, SUGGEST RECOLLECTION  Final   Report Status 10/18/2015 FINAL  Final  Culture, routine-abscess     Status: None   Collection Time: 10/17/15  3:20 PM  Result Value Ref Range Status   Specimen Description PERITONEAL ABSCESS  Final   Special Requests NONE  Final   Gram Stain   Final    ABUNDANT WBC PRESENT, PREDOMINANTLY PMN NO SQUAMOUS EPITHELIAL CELLS SEEN ABUNDANT GRAM POSITIVE COCCI IN PAIRS IN CHAINS MODERATE GRAM NEGATIVE RODS Performed at [facility]    Culture   Final    ABUNDANT ESCHERICHIA COLI Performed at [facility]    Report Status 10/20/2015 FINAL  Final   Organism ID, Bacteria ESCHERICHIA COLI  Final      Susceptibility   Escherichia coli - MIC*    AMPICILLIN <=2 SENSITIVE Sensitive     AMPICILLIN/SULBACTAM <=2 SENSITIVE Sensitive     CEFAZOLIN <=4 SENSITIVE Sensitive     CEFEPIME <=1 SENSITIVE Sensitive     CEFTAZIDIME <=1 SENSITIVE Sensitive     CEFTRIAXONE <=1 SENSITIVE Sensitive     CIPROFLOXACIN <=0.25 SENSITIVE Sensitive     GENTAMICIN <=1 SENSITIVE Sensitive     IMIPENEM <=0.25 SENSITIVE Sensitive     PIP/TAZO <=4 SENSITIVE Sensitive     TOBRAMYCIN <=1 SENSITIVE Sensitive  TRIMETH/SULFA Value in next row Sensitive      <=20 SENSITIVE(NOTE)    * ABUNDANT ESCHERICHIA COLI  Anaerobic culture     Status: None   Collection Time: 10/17/15  3:20 PM  Result Value Ref Range Status   Specimen Description PERITONEAL ABSCESS  Final   Special Requests NONE  Final   Gram Stain   Final    ABUNDANT WBC PRESENT, PREDOMINANTLY PMN NO SQUAMOUS EPITHELIAL CELLS SEEN ABUNDANT GRAM POSITIVE COCCI IN PAIRS IN CHAINS MODERATE GRAM NEGATIVE RODS Performed at [facility]    Culture   Final    NO ANAEROBES ISOLATED Performed at [facility]    Report Status 10/22/2015 FINAL  Final  MRSA PCR Screening     Status: None   Collection Time: 10/18/15  8:32 PM  Result Value Ref Range Status   MRSA by PCR NEGATIVE NEGATIVE Final    Comment:        The GeneXpert MRSA  Assay (FDA approved for NASAL specimens only), is one component of a comprehensive MRSA colonization surveillance program. It is not intended to diagnose MRSA infection nor to guide or monitor treatment for MRSA infections.     Impression/Plan:  1. Intra-abdominal abscess - on ceftriaxone and flagyl along with vancomycin.  Likely mixed. Vancomycin due to gram stain with GPC in chains, concern for continued Enterococcus.

## 2015-10-27 DIAGNOSIS — E639 Nutritional deficiency, unspecified: Secondary | ICD-10-CM

## 2015-10-27 LAB — CBC
HCT: 32.3 % — ABNORMAL LOW (ref 39.0–52.0)
Hemoglobin: 10.1 g/dL — ABNORMAL LOW (ref 13.0–17.0)
MCH: 27.4 pg (ref 26.0–34.0)
MCHC: 31.3 g/dL (ref 30.0–36.0)
MCV: 87.8 fL (ref 78.0–100.0)
PLATELETS: 349 10*3/uL (ref 150–400)
RBC: 3.68 MIL/uL — AB (ref 4.22–5.81)
RDW: 18.9 % — ABNORMAL HIGH (ref 11.5–15.5)
WBC: 14.6 10*3/uL — ABNORMAL HIGH (ref 4.0–10.5)

## 2015-10-27 LAB — PROTIME-INR
INR: 2.56 — ABNORMAL HIGH (ref 0.00–1.49)
PROTHROMBIN TIME: 27.1 s — AB (ref 11.6–15.2)

## 2015-10-27 LAB — PREALBUMIN: Prealbumin: 24.7 mg/dL (ref 18–38)

## 2015-10-27 MED ORDER — HYDROCORTISONE NA SUCCINATE PF 100 MG IJ SOLR
50.0000 mg | Freq: Two times a day (BID) | INTRAMUSCULAR | Status: DC
  Administered 2015-10-27: 50 mg via INTRAVENOUS
  Filled 2015-10-27: qty 2

## 2015-10-27 MED ORDER — WARFARIN SODIUM 5 MG PO TABS
5.0000 mg | ORAL_TABLET | Freq: Once | ORAL | Status: AC
  Administered 2015-10-27: 5 mg via ORAL
  Filled 2015-10-27: qty 1

## 2015-10-27 NOTE — Care Management Important Message (Signed)
Important Message  Patient Details  Name: Jonathan Le MRN: [id number] Date of Birth: 04/07/1946   Medicare Important Message Given:  Yes    Haskell FlirtJamison, Edder Bellanca 10/27/2015, 9:25 AMImportant Message  Patient Details  Name: Jonathan Le MRN: [id number] Date of Birth: 05/01/1946   Medicare Important Message Given:  Yes    Haskell FlirtJamison, Vega Withrow 10/27/2015, 9:24 AM

## 2015-10-27 NOTE — Progress Notes (Signed)
ANTICOAGULATION CONSULT NOTE - Follow Up  Pharmacy Consult for Warfarin Indication: DVT  Allergies  Allergen Reactions  . Unasyn [Ampicillin-Sulbactam Sodium] Hives, Itching and Rash    Diffuse rash not responding to Pepcid and Benadryl.  TDD.  Marland Kitchen. Levaquin [Levofloxacin] Nausea Only  . Penicillins Hives  . Doxycycline Rash   Patient Measurements: Height: 5\' 9"  (175.3 cm) Weight: 250 lb 10.6 oz (113.7 kg) IBW/kg (Calculated) : 70.7  Vital Signs: Temp: 96.1 F (35.6 C) (12/13 0358) Temp Source: Axillary (12/13 0358) BP: 123/62 mmHg (12/13 0358) Pulse Rate: 73 (12/13 0358)  Labs:  Recent Labs  10/24/15 1222 10/25/15 0533 10/26/15 0715 10/27/15 0419  HGB 9.7*  --  9.5*  --   HCT 31.4*  --  30.5*  --   PLT 437*  --  367  --   LABPROT  --  25.0* 27.2* 27.1*  INR  --  2.30* 2.57* 2.56*   Estimated Creatinine Clearance: 106.8 mL/min (by C-G formula based on Cr of 0.56).  Medications:  Scheduled:  . antiseptic oral rinse  15 mL Mouth Rinse TID PC  . cefTRIAXone (ROCEPHIN)  IV  2 g Intravenous Q24H  . furosemide  40 mg Oral Daily  . hydrocortisone sodium succinate  50 mg Intravenous 4 times per day  . magnesium oxide  800 mg Oral BID  . metronidazole  500 mg Intravenous Q8H  . multivitamin with minerals  1 tablet Oral Daily  . nutrition supplement (JUVEN)  1 packet Oral BID BM  . pantoprazole  40 mg Oral Daily  . potassium chloride  30 mEq Oral BID  . protein supplement  2 oz Oral QID  . vancomycin  750 mg Intravenous Q12H  . Warfarin - Pharmacist Dosing Inpatient   Does not apply q1800   Assessment: Patient known to pharmacy for dosing of antibiotics. Patient was on warfarin prior to admission at a dose of 5mg  q6pm for hx DVT was on hold at admission, now to restart warfarin with pharmacy dosing assisstance.  Today, 10/27/2015  INR therapeutic - warfarin resumed 12/6  Hgb low but stable, Plt stable (12/12)  No reported bleeding, good po intake  Drug  interactions: Flagyl: can increase INR  Goal of Therapy:  INR 2-3   Plan:   Warfarin 5mg  x 1 today at 1800   Dose reduced 12/12 PM d/t INR trending up - better 12/13am  Daily PT/INR  [name], PharmD, BCPS.   Pager: [phone] 10/27/2015 9:08 AM

## 2015-10-27 NOTE — Progress Notes (Signed)
Inpatient Rehabilitation  We received a prescreen request from OT.  I note that Dr. Rhona Leavenshiu has placed an order for IP Rehab.  Will await consult recommendations and the admissions coordinator will follow up as appropriate.    Weldon PickingSusan Geza Beranek PT Inpatient Rehab Admissions Coordinator Cell [phone] Office [phone]

## 2015-10-27 NOTE — Progress Notes (Signed)
I reviewed his CT scan, the drain injection study, and the Gastrografin enema. CT scan demonstrated resolution of the abscess. Injection of the drain did not demonstrate any connection with the gastrointestinal tract. However, Gastrografin enema demonstrates a rectal stump leak. Even though this doesn't definitively connect with the drain and I suspect this is been the source of his recurrent intra-abdominal abscesses. I long discussion with his wife about this. He is currently on steroids which will need to be weaned off it at all possible I doubt this will heal on steroids. His protein level is now within normal range by way of prealbumin level I recommend the Gastrografin enema be repeated in 2-3 weeks and repeat the drain study. This could take 6-12 weeks to heal, if it will heal.

## 2015-10-27 NOTE — Progress Notes (Signed)
[facility] PROGRESS NOTE  Jonathan Le:[id number] DOB: [birth year] DOA: 10/16/2015 PCP: Elizabeth Palau, FNP  HPI/Brief narrative 69 year old male with medical history of CAD status post CABG in July 2016, CHF with EF of 30-35 %, hypertension and history of DVT on Coumadin, obstructive sleep apnea on CPAP, glaucoma, stage 4 sacral decubitus, development of ischemic colitis in August 2016 for which he underwent excoriated a laparotomy with total colectomy and ileostomy with prolonged intubation requiring tracheostomy, followed by a complicated course with multiple abdominal and pelvic abscesses that have been drained. His last CT scan from 09/18/2015 showed fluid collection to have resolved and drains were removed. Patient was then sent to [facility]. For the last 2 weeks he was having vague abdominal pain with occasional fever. He was found to have a UTI and was started on antibiotics. However for the past 2 days he was having low blood pressure. He was sent for outpatient CT scan of the abdomen which showed a large central abdominal abscess measuring [id number] cm and was sent to the ED. Patient was found to be in septic shock with fever of 102.52F, tachycardic, hypotensive (blood pressure 83/37 mmHg) and significant leukocytosis (16.9 Le). Patient admitted to stepdown unit on empiric antibiotics. General surgery consulted . Patient had drainage of his abdominal abscess by IR on 12/3.  Assessment/Plan: Recurrent Intra-abdominal abscess with sepsis/septic shock -Sepsis resolved. -CT drainage of the abdominal abscess by IR on 12/3.Jonathan Le Continuous significant output from the drain. Cultures growing Escherichia coli which is pansensitive. For now, continued on vancomycin with ceftriaxone and Flagyl. - ID following. Total course of antibiotic duration per ID. Patient was treated with Unasyn followed by Augmentin for enterococcus on the abscess culture upon discharge in October  2016 -The source of abscess is not clear. As per surgery, when the drain output decreases they would like to do the following: - Water soluble enema without leak or fistula. Plan for possible removal of drain by IR - Patient remains on q6hr stress dosed steroids. Will begin weaning and ultimately plan to complete prednisone taper on discharge  CAD status post CABG with cardiomyopathy Patient had NSTEMI in July 2016 with cardiogenic shock . Three-vessel disease seen on cardiac cath with EF of 30-35% He underwent CABG. Currently has some volume overload. Off basal ivf. On low-dose Lasix. Monitor strict I/O.   Abdominal pain - Likely secondary to abscess. Improved.  - Continue supportive therapy  Stage IV sacral decubitus ulcer Wound care consult appreciated. As per surgery who spoke with plastic surgeon Dr. Kelly Splinter he may qualify to get a surgical flap for the decubitus ulcer once nutritional status is satisfactory. This will have to be done at [facility]. Patient currently working on eating a high protein diet.  Anemia of chronic disease Reportedly had some blood in his colostomy . Mild drop in hemoglobin but around baseline. INR therapeutic. Resumed Coumadin.  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 . H&H stable. resumed coumadin.  OSA Continue nighttime CPAP  Protein calorie malnutrition Nutritionist consulted. Switched nutritional supplements  DVT prophylaxis: Therapeutic INR  Code Status: full Family Communication: Pt in room Disposition Plan: Possible SNF in 24-48hrs   Consultants:  CCS  PCCM  IR  ID  Procedures:  CT drainage of abdominal abscess on 12/4  Antibiotics: Anti-infectives    Start     Dose/Rate Route Frequency Ordered Stop   10/26/15 2000  vancomycin (VANCOCIN) IVPB 750 mg/150 ml premix  750 mg 150 mL/hr over 60 Minutes Intravenous Every 12 hours 10/26/15 0835     10/20/15 2000  vancomycin (VANCOCIN)  IVPB 1000 mg/200 mL premix     1,000 mg 200 mL/hr over 60 Minutes Intravenous Every 12 hours 10/20/15 1606 10/26/15 0957   10/20/15 1630  metroNIDAZOLE (FLAGYL) IVPB 500 mg    Comments:  Pharmacy may adjust   500 mg 100 mL/hr over 60 Minutes Intravenous Every 8 hours 10/20/15 1541     10/20/15 1330  cefTRIAXone (ROCEPHIN) 2 g in dextrose 5 % 50 mL IVPB     2 g 100 mL/hr over 30 Minutes Intravenous Every 24 hours 10/20/15 1304     10/18/15 0400  ceFEPIme (MAXIPIME) 2 g in dextrose 5 % 50 mL IVPB  Status:  Discontinued     2 g 100 mL/hr over 30 Minutes Intravenous 3 times per day 10/18/15 0314 10/20/15 1304   10/17/15 1730  cefTRIAXone (ROCEPHIN) 1 g in dextrose 5 % 50 mL IVPB  Status:  Discontinued     1 g 100 mL/hr over 30 Minutes Intravenous Every 24 hours 10/16/15 2146 10/17/15 0008   10/17/15 1000  cefTRIAXone (ROCEPHIN) 2 g in dextrose 5 % 50 mL IVPB  Status:  Discontinued     2 g 100 mL/hr over 30 Minutes Intravenous Every 24 hours 10/17/15 0008 10/18/15 0236   10/17/15 0800  vancomycin (VANCOCIN) IVPB 1000 mg/200 mL premix  Status:  Discontinued     1,000 mg 200 mL/hr over 60 Minutes Intravenous Every 12 hours 10/16/15 1932 10/20/15 1303   10/17/15 0130  metroNIDAZOLE (FLAGYL) IVPB 500 mg  Status:  Discontinued     500 mg 100 mL/hr over 60 Minutes Intravenous Every 8 hours 10/16/15 2147 10/20/15 1304   10/16/15 1730  metroNIDAZOLE (FLAGYL) IVPB 500 mg     500 mg 100 mL/hr over 60 Minutes Intravenous  Once 10/16/15 1721 10/16/15 1923   10/16/15 1730  cefTRIAXone (ROCEPHIN) 1 g in dextrose 5 % 50 mL IVPB     1 g 100 mL/hr over 30 Minutes Intravenous  Once 10/16/15 1721 10/16/15 1902   10/16/15 1730  vancomycin (VANCOCIN) 2,000 mg in sodium chloride 0.9 % 500 mL IVPB     2,000 mg 250 mL/hr over 120 Minutes Intravenous  Once 10/16/15 1727 10/16/15 2222      HPI/Subjective: No complaints. Denies sob  Objective: Filed Vitals:   10/26/15 0451 10/26/15 2106 10/26/15 2149  10/27/15 0358  BP: 120/68  122/71 123/62  Pulse: 97  78 73  Temp: 97.6 F (36.4 C)  97.7 F (36.5 C) 96.1 F (35.6 C)  TempSrc: Oral  Oral Axillary  Resp: Height:      Weight: 117.1 kg (258 lb 2.5 oz)   113.7 kg (250 lb 10.6 oz)  SpO2: 97%  97% 96%    Intake/Output Summary (Last 24 hours) at 10/27/15 1417 Last data filed at 10/27/15 1100  Gross per 24 hour  Intake   1200 ml  Output   1355 ml  Net   -155 ml   Filed Weights   10/25/15 0615 10/26/15 0451 10/27/15 0358  Weight: 117.6 kg (259 lb 4.2 oz) 117.1 kg (258 lb 2.5 oz) 113.7 kg (250 lb 10.6 oz)    Exam:   General:  Awake, in nad  Cardiovascular: regular, s1, s2  Respiratory: normal resp effort, no wheezing  Abdomen: soft,nondistended, obese  Musculoskeletal: perfused, no clubbing   Data Reviewed:  Basic Metabolic Panel:  Recent Labs Lab 10/23/15 0510  CREATININE 0.56*   Liver Function Tests: No results for input(s): AST, ALT, ALKPHOS, BILITOT, PROT, ALBUMIN in the last 168 hours. No results for input(s): LIPASE, AMYLASE in the last 168 hours. No results for input(s): AMMONIA in the last 168 hours. CBC:  Recent Labs Lab 10/22/15 0449 10/24/15 1222 10/26/15 0715 10/27/15 1015  WBC 13.0* 13.3* 11.6* 14.6*  HGB 9.0* 9.7* 9.5* 10.1*  HCT 29.3* 31.4* 30.5* 32.3*  MCV 85.7 88.2 88.9 87.8  PLT 476* 437* 367 349   Cardiac Enzymes: No results for input(s): CKTOTAL, CKMB, CKMBINDEX, TROPONINI in the last 168 hours. BNP (last 3 results)  Recent Labs  07/28/15 0500 08/02/15 0620 08/16/15 0701  BNP 453.8* 367.7* 528.3*    ProBNP (last 3 results) No results for input(s): PROBNP in the last 8760 hours.  CBG: No results for input(s): GLUCAP in the last 168 hours.  Recent Results (from the past 240 hour(s))  Culture, routine-abscess     Status: None   Collection Time: 10/17/15  3:20 PM  Result Value Ref Range Status   Specimen Description PERITONEAL ABSCESS  Final   Special  Requests NONE  Final   Gram Stain   Final    ABUNDANT WBC PRESENT, PREDOMINANTLY PMN NO SQUAMOUS EPITHELIAL CELLS SEEN ABUNDANT GRAM POSITIVE COCCI IN PAIRS IN CHAINS MODERATE GRAM NEGATIVE RODS Performed at [facility]    Culture   Final    ABUNDANT ESCHERICHIA COLI Performed at [facility]    Report Status 10/20/2015 FINAL  Final   Organism ID, Bacteria ESCHERICHIA COLI  Final      Susceptibility   Escherichia coli - MIC*    AMPICILLIN <=2 SENSITIVE Sensitive     AMPICILLIN/SULBACTAM <=2 SENSITIVE Sensitive     CEFAZOLIN <=4 SENSITIVE Sensitive     CEFEPIME <=1 SENSITIVE Sensitive     CEFTAZIDIME <=1 SENSITIVE Sensitive     CEFTRIAXONE <=1 SENSITIVE Sensitive     CIPROFLOXACIN <=0.25 SENSITIVE Sensitive     GENTAMICIN <=1 SENSITIVE Sensitive     IMIPENEM <=0.25 SENSITIVE Sensitive     PIP/TAZO <=4 SENSITIVE Sensitive     TOBRAMYCIN <=1 SENSITIVE Sensitive     TRIMETH/SULFA Value in next row Sensitive      <=20 SENSITIVE(NOTE)    * ABUNDANT ESCHERICHIA COLI  Anaerobic culture     Status: None   Collection Time: 10/17/15  3:20 PM  Result Value Ref Range Status   Specimen Description PERITONEAL ABSCESS  Final   Special Requests NONE  Final   Gram Stain   Final    ABUNDANT WBC PRESENT, PREDOMINANTLY PMN NO SQUAMOUS EPITHELIAL CELLS SEEN ABUNDANT GRAM POSITIVE COCCI IN PAIRS IN CHAINS MODERATE GRAM NEGATIVE RODS Performed at [facility]    Culture   Final    NO ANAEROBES ISOLATED Performed at [facility]    Report Status 10/22/2015 FINAL  Final  MRSA PCR Screening     Status: None   Collection Time: 10/18/15  8:32 PM  Result Value Ref Range Status   MRSA by PCR NEGATIVE NEGATIVE Final    Comment:        The GeneXpert MRSA Assay (FDA approved for NASAL specimens only), is one component of a comprehensive MRSA colonization surveillance program. It is not intended to diagnose MRSA infection nor to guide or monitor treatment  for MRSA infections.      Studies: Ir Sinus/fist Tube Chk-non Gi  10/26/2015  CLINICAL DATA:  History of partial colectomy for ischemic colitis complicated by development of multiple postoperative abdominal fluid collections / abscesses and several ultrasound and fluoroscopic guided percutaneous drainage catheter placements. Patient recently underwent a CT-guided abdominal fluid collection percutaneous drainage catheter placement on 10/17/2015. Subsequent CT scan performed 10/25/2015 demonstrates complete resolution of previously drained intra-abdominal abscess. As such, patient presents today for fluoroscopic guided percutaneous drainage catheter injection. EXAM: SINUS TRACT INJECTION/FISTULOGRAM COMPARISON:  CT abdomen pelvis -10/25/2015; 08/10/2015; 07/29/2015; 07/21/2015; CT-guided abdominal abscess drainage catheter placement- 10/17/2015; 07/21/2015; ultrasound-guided left abdominal wall percutaneous drainage catheter placement - 08/12/2015 CONTRAST:  15 cc Omnipaque 300 FLUOROSCOPY TIME:  1 minute, 12 seconds (65 mGy) TECHNIQUE: The patient was positioned supine on the fluoroscopy table. A preprocedural spot fluoroscopic image was obtained of the midline of the abdomen existing solitary percutaneous drainage catheter. Multiple spot fluoroscopic and radiographic images were obtained in various obliquities following the injection of a small amount of contrast. Images were reviewed and the drainage catheter was flushed with a small amount of saline and reconnected to a JP bulb. A dressing was placed. The patient tolerated the procedure well without immediate postprocedural complication. FINDINGS: Preprocedural spot fluoroscopic image demonstrates unchanged positioning of the solitary existing percutaneous drainage catheter with end coiled and locked over the midline of the abdomen. Contrast injection demonstrates opacification of the cheek of breast abscess cavity but was negative for definitive  communication with any adjacent loop of large or small intestine. IMPRESSION: Contrast injection of the existing percutaneous drainage catheter is negative for the presence of a fistulous connection to an adjacent loop of large or small bowel. PLAN: Above findings were discussed with referring surgical p.a., Barnetta Chapel. [facility] wants the patient to undergo a contrast-enhanced enema to further evaluate for anastomotic leak prior to removal of the percutaneous drainage catheter. If the contrast-enhanced enema is also negative for fistulous connection to the residual percutaneous drainage catheter, the drainage catheter may be removed at the patient's bedside as per the recommendations of [facility]. Electronically Signed   By: Simonne Come M.D.   On: 10/26/2015 13:46   Dg Colon W/water Sol Cm  10/26/2015  CLINICAL DATA:  Intra-abdominal abscess. Evaluate for rectal stump leak. EXAM: WATER SOLUBLE CONTRAST ENEMA FLUOROSCOPY TIME:  Radiation Exposure Index (as provided by the fluoroscopic device): 74.6 mGy If the device does not provide the exposure index: Fluoroscopy Time:  0 minutes 57 seconds. Number of Acquired Images:  12 COMPARISON:  CT abdomen pelvis 10/25/2015. FINDINGS: Scout view of the abdomen shows a percutaneous pigtail catheter terminating in the low central abdomen. Stomach is distended with air. Mild gaseous distention of small bowel. After digital rectal examination, enema tip was inserted into the rectum. Small amount of water-soluble contrast was allowed to infuse under gravity. Rectal stump opacifies. A thin trail of contrast extends from the stump, without opacification of additional bowel or leakage of contrast. Delayed images suggest rectal prolapse. IMPRESSION: 1. Opacification of the rectal stump with a thin trail of contrast extending from the stump. No definitive evidence of a fistulous connection to small bowel. No leak. 2. Suspect rectal prolapse. Electronically Signed   By: Leanna Battles M.D.    On: 10/26/2015 16:07    Scheduled Meds: . antiseptic oral rinse  15 mL Mouth Rinse TID PC  . cefTRIAXone (ROCEPHIN)  IV  2 g Intravenous Q24H  . furosemide  40 mg Oral Daily  . hydrocortisone sodium succinate  50 mg Intravenous Q12H  . magnesium oxide  800 mg Oral BID  . metronidazole  500 mg Intravenous Q8H  . multivitamin with minerals  1 tablet Oral Daily  . nutrition supplement (JUVEN)  1 packet Oral BID BM  . pantoprazole  40 mg Oral Daily  . potassium chloride  30 mEq Oral BID  . protein supplement  2 oz Oral QID  . vancomycin  750 mg Intravenous Q12H  . warfarin  5 mg Oral ONCE-1800  . Warfarin - Pharmacist Dosing Inpatient   Does not apply q1800   Continuous Infusions:   Principal Problem:   Abdominal abscess (HCC) Active Problems:   OSA on CPAP   CAD, multiple vessel   Abdominal pain, generalized   Sacral decubitus ulcer   Chronic systolic CHF (congestive heart failure) (HCC)   Sepsis (HCC)   Warfarin-induced coagulopathy (HCC)   Anemia of chronic disease   Hyponatremia   Hypotension   Acute pulmonary edema (HCC)   Congestive heart failure (HCC)   Hypoxemia   Arterial hypotension   Severe sepsis (HCC)   Jonathan Le  [facility] Pager [phone]. If 7PM-7AM, please contact night-coverage at [email or web address], password [facility] 10/27/2015, 2:17 PM  LOS: 11 days

## 2015-10-27 NOTE — Progress Notes (Signed)
Patient ID: ROMEL DUMOND, male   DOB: 06-Dec-1945, 69 y.o.   MRN: [id number]     [facility]      [address]   [place], [place] [address]    Phone: [phone] FAX: [phone]     Subjective: Appetite is great.  Afebrile.   Objective:  Vital signs:  Filed Vitals:   10/26/15 0451 10/26/15 2106 10/26/15 2149 10/27/15 0358  BP: 120/68  122/71 123/62  Pulse: 97  78 73  Temp: 97.6 F (36.4 C)  97.7 F (36.5 C) 96.1 F (35.6 C)  TempSrc: Oral  Oral Axillary  Resp: Height:      Weight: 117.1 kg (258 lb 2.5 oz)   113.7 kg (250 lb 10.6 oz)  SpO2: 97%  97% 96%    Last BM Date: 10/25/15  Intake/Output   Yesterday:  12/12 0701 - 12/13 0700 In: 1230 [P.O.:720; IV Piggyback:500] Out: 1000 [Drains:25; Stool:975] This shift:    I/O last 3 completed shifts: In: 1930 [P.O.:1080; Other:50; IV Piggyback:800] Out: 1600 [Urine:400; Drains:25; Stool:1175]   Physical Exam: General: Pt awake/alert/oriented x4 in no acute distress Abdomen: Soft.  Nondistended.  Non tender.  rlq ostomy functioning.  Drain with serous output.  No evidence of peritonitis.  No incarcerated hernias.  Skin: large stage IV sacral decub, clean, repacked.    Problem List:   Principal Problem:   Abdominal abscess (HCC) Active Problems:   OSA on CPAP   CAD, multiple vessel   Abdominal pain, generalized   Sacral decubitus ulcer   Chronic systolic CHF (congestive heart failure) (HCC)   Sepsis (HCC)   Warfarin-induced coagulopathy (HCC)   Anemia of chronic disease   Hyponatremia   Hypotension   Acute pulmonary edema (HCC)   Congestive heart failure (HCC)   Hypoxemia   Arterial hypotension   Severe sepsis (HCC)    Results:   Labs: Results for orders placed or performed during the hospital encounter of 10/16/15 (from the past 48 hour(s))  Protime-INR     Status: Abnormal   Collection Time: 10/26/15  7:15 AM  Result Value Ref Range    Prothrombin Time 27.2 (H) 11.6 - 15.2 seconds   INR 2.57 (H) 0.00 - 1.49  Vancomycin, trough     Status: None   Collection Time: 10/26/15  7:15 AM  Result Value Ref Range   Vancomycin Tr 19 10.0 - 20.0 ug/mL  CBC     Status: Abnormal   Collection Time: 10/26/15  7:15 AM  Result Value Ref Range   WBC 11.6 (H) 4.0 - 10.5 K/uL   RBC 3.43 (L) 4.22 - 5.81 MIL/uL   Hemoglobin 9.5 (L) 13.0 - 17.0 g/dL   HCT 95.2 (L) 84.1 - 32.4 %   MCV 88.9 78.0 - 100.0 fL   MCH 27.7 26.0 - 34.0 pg   MCHC 31.1 30.0 - 36.0 g/dL   RDW 40.1 (H) 02.7 - 25.3 %   Platelets 367 150 - 400 K/uL  Protime-INR     Status: Abnormal   Collection Time: 10/27/15  4:19 AM  Result Value Ref Range   Prothrombin Time 27.1 (H) 11.6 - 15.2 seconds   INR 2.56 (H) 0.00 - 1.49    Imaging / Studies: Ct Abdomen Pelvis W Contrast  10/25/2015  CLINICAL DATA:  69 year old with subtotal colectomy and ileostomy due to ischemic colitis 06/18/2015, subsequent hospitalization in October for abdominal abscess drainage and possible stump breakdown, percutaneous  drainage of a large intra-abdominal abscess 10/17/2015, now with persistent leukocytosis. Patient also has a sacral decubitus ulcer. EXAM: CT ABDOMEN AND PELVIS WITH CONTRAST TECHNIQUE: Multidetector CT imaging of the abdomen and pelvis was performed using the standard protocol following bolus administration of intravenous contrast. CONTRAST:  100mL OMNIPAQUE IOHEXOL 300 MG/ML IV. Oral contrast was also administered. COMPARISON:  Multiple prior CT abdomen and pelvis examinations dating back to 06/18/2015, most recently 10/17/2015 at the time of drainage catheter placement. FINDINGS: Lower chest: Bilateral pleural effusions, right greater than left, with associated passive atelectasis in the lower lobes, increased since the 10/16/2015 examination. Calcified granuloma again noted in the left lower lobe. Heart enlarged with extensive 3 vessel coronary atherosclerosis. Hepatobiliary: Liver  normal in size. Relative enlargement of the left lobe and caudate lobe compared to the right lobe. Gallbladder contracted, accounting for the mucosal enhancement. No calcified gallstones. No biliary ductal dilation. Pancreas: Normal in appearance without evidence of mass, ductal dilation, or inflammation. Spleen: Normal in size and appearance. Adrenals/Urinary Tract: Normal appearing adrenal glands. Nonobstructing bilateral renal calculi as noted previously. Kidneys otherwise normal in appearance. Normal-appearing urinary bladder. Stomach/Bowel: Stomach normal in appearance for the degree of distention. Wall thickening and edema involving several loops of what are likely mid and distal jejunum. Small extraluminal gas bubbles adjacent to an abnormal loop in the left upper pelvis. Remainder of the small bowel normal in appearance. Normal-appearing ileostomy in the right periumbilical region. Fluid-filled Hartmann pouch as noted previously. Right paramedian sacral decubitus ulcer. Vascular/Lymphatic: Mild to moderate aortoiliofemoral atherosclerosis without aneurysm. Widely patent visceral arteries. 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 Reproductive: Moderate to marked prostate gland enlargement, unchanged. Other: Edema involving the small bowel mesentery. Previously identified abscess completely drained, with no residual fluid collection. No new abscess. Severe generalized body wall edema. Musculoskeletal: Degenerative disc disease, spondylosis and facet degenerative changes diffusely throughout the lumbar spine with severe multifactorial spinal stenosis at L2-3 and L4-5 and moderate multifactorial spinal stenosis at L3-4. Bilateral hip joint effusions. IMPRESSION: 1. No evidence of residual abdominal abscess after percutaneous drainage. No new abscess elsewhere. 2. Wall thickening/edema involving a several cm segment of mid and distal jejunum, associated with edema in the small bowel  mesentery. Extraluminal gas bubbles in the left upper pelvis are in proximity to one of these abnormal bowel loops and may indicate microperforation. 3. Satisfactory appearance of the ileostomy in the right upper pelvis. 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 5. Bilateral pleural effusions, right greater than left, with associated passive atelectasis in the lower lobes. 6. Anasarca. 7. Moderate to marked prostate gland enlargement. 8. Osseous findings as above, with multilevel multifactorial spinal stenosis. 9. Right paramedian sacral decubitus ulcer with possible osteomyelitis involving the last coccygeal segment. 10. Bilateral hip joint effusions. Electronically Signed   By: Hulan Saashomas  Lawrence M.D.   On: 10/25/2015 15:50   Ir Sinus/fist Tube Chk-non Gi  10/26/2015  CLINICAL DATA:  History of partial colectomy for ischemic colitis complicated by development of multiple postoperative abdominal fluid collections / abscesses and several ultrasound and fluoroscopic guided percutaneous drainage catheter placements. Patient recently underwent a CT-guided abdominal fluid collection percutaneous drainage catheter placement on 10/17/2015. Subsequent CT scan performed 10/25/2015 demonstrates complete resolution of previously drained intra-abdominal abscess. As such, patient presents today for fluoroscopic guided percutaneous drainage catheter injection. EXAM: SINUS TRACT INJECTION/FISTULOGRAM COMPARISON:  CT abdomen pelvis -10/25/2015; 08/10/2015; 07/29/2015; 07/21/2015; CT-guided abdominal abscess drainage catheter placement- 10/17/2015; 07/21/2015; ultrasound-guided left abdominal wall  percutaneous drainage catheter placement - 08/12/2015 CONTRAST:  15 cc Omnipaque 300 FLUOROSCOPY TIME:  1 minute, 12 seconds (65 mGy) TECHNIQUE: The patient was positioned supine on the fluoroscopy table. A preprocedural spot fluoroscopic image was obtained of the midline of the abdomen existing solitary  percutaneous drainage catheter. Multiple spot fluoroscopic and radiographic images were obtained in various obliquities following the injection of a small amount of contrast. Images were reviewed and the drainage catheter was flushed with a small amount of saline and reconnected to a JP bulb. A dressing was placed. The patient tolerated the procedure well without immediate postprocedural complication. FINDINGS: Preprocedural spot fluoroscopic image demonstrates unchanged positioning of the solitary existing percutaneous drainage catheter with end coiled and locked over the midline of the abdomen. Contrast injection demonstrates opacification of the cheek of breast abscess cavity but was negative for definitive communication with any adjacent loop of large or small intestine. IMPRESSION: Contrast injection of the existing percutaneous drainage catheter is negative for the presence of a fistulous connection to an adjacent loop of large or small bowel. PLAN: Above findings were discussed with referring surgical p.a., Barnetta Chapel. [facility] wants the patient to undergo a contrast-enhanced enema to further evaluate for anastomotic leak prior to removal of the percutaneous drainage catheter. If the contrast-enhanced enema is also negative for fistulous connection to the residual percutaneous drainage catheter, the drainage catheter may be removed at the patient's bedside as per the recommendations of [facility]. Electronically Signed   By: Simonne Come M.D.   On: 10/26/2015 13:46   Dg Colon W/water Sol Cm  10/26/2015  CLINICAL DATA:  Intra-abdominal abscess. Evaluate for rectal stump leak. EXAM: WATER SOLUBLE CONTRAST ENEMA FLUOROSCOPY TIME:  Radiation Exposure Index (as provided by the fluoroscopic device): 74.6 mGy If the device does not provide the exposure index: Fluoroscopy Time:  0 minutes 57 seconds. Number of Acquired Images:  12 COMPARISON:  CT abdomen pelvis 10/25/2015. FINDINGS: Scout view of the abdomen shows a  percutaneous pigtail catheter terminating in the low central abdomen. Stomach is distended with air. Mild gaseous distention of small bowel. After digital rectal examination, enema tip was inserted into the rectum. Small amount of water-soluble contrast was allowed to infuse under gravity. Rectal stump opacifies. A thin trail of contrast extends from the stump, without opacification of additional bowel or leakage of contrast. Delayed images suggest rectal prolapse. IMPRESSION: 1. Opacification of the rectal stump with a thin trail of contrast extending from the stump. No definitive evidence of a fistulous connection to small bowel. No leak. 2. Suspect rectal prolapse. Electronically Signed   By: Leanna Battles M.D.   On: 10/26/2015 16:07    Medications / Allergies:  Scheduled Meds: . antiseptic oral rinse  15 mL Mouth Rinse TID PC  . cefTRIAXone (ROCEPHIN)  IV  2 g Intravenous Q24H  . furosemide  40 mg Oral Daily  . hydrocortisone sodium succinate  50 mg Intravenous 4 times per day  . magnesium oxide  800 mg Oral BID  . metronidazole  500 mg Intravenous Q8H  . multivitamin with minerals  1 tablet Oral Daily  . nutrition supplement (JUVEN)  1 packet Oral BID BM  . pantoprazole  40 mg Oral Daily  . potassium chloride  30 mEq Oral BID  . protein supplement  2 oz Oral QID  . vancomycin  750 mg Intravenous Q12H  . warfarin  5 mg Oral ONCE-1800  . Warfarin - Pharmacist Dosing Inpatient   Does not apply [phone]  Continuous Infusions:  PRN Meds:.acetaminophen **OR** acetaminophen, alum & mag hydroxide-simeth, HYDROmorphone (DILAUDID) injection, ondansetron **OR** ondansetron (ZOFRAN) IV, oxyCODONE  Antibiotics: Anti-infectives    Start     Dose/Rate Route Frequency Ordered Stop   10/26/15 2000  vancomycin (VANCOCIN) IVPB 750 mg/150 ml premix     750 mg 150 mL/hr over 60 Minutes Intravenous Every 12 hours 10/26/15 0835     10/20/15 2000  vancomycin (VANCOCIN) IVPB 1000 mg/200 mL premix     1,000  mg 200 mL/hr over 60 Minutes Intravenous Every 12 hours 10/20/15 1606 10/26/15 0957   10/20/15 1630  metroNIDAZOLE (FLAGYL) IVPB 500 mg    Comments:  Pharmacy may adjust   500 mg 100 mL/hr over 60 Minutes Intravenous Every 8 hours 10/20/15 1541     10/20/15 1330  cefTRIAXone (ROCEPHIN) 2 g in dextrose 5 % 50 mL IVPB     2 g 100 mL/hr over 30 Minutes Intravenous Every 24 hours 10/20/15 1304     10/18/15 0400  ceFEPIme (MAXIPIME) 2 g in dextrose 5 % 50 mL IVPB  Status:  Discontinued     2 g 100 mL/hr over 30 Minutes Intravenous 3 times per day 10/18/15 0314 10/20/15 1304   10/17/15 1730  cefTRIAXone (ROCEPHIN) 1 g in dextrose 5 % 50 mL IVPB  Status:  Discontinued     1 g 100 mL/hr over 30 Minutes Intravenous Every 24 hours 10/16/15 2146 10/17/15 0008   10/17/15 1000  cefTRIAXone (ROCEPHIN) 2 g in dextrose 5 % 50 mL IVPB  Status:  Discontinued     2 g 100 mL/hr over 30 Minutes Intravenous Every 24 hours 10/17/15 0008 10/18/15 0236   10/17/15 0800  vancomycin (VANCOCIN) IVPB 1000 mg/200 mL premix  Status:  Discontinued     1,000 mg 200 mL/hr over 60 Minutes Intravenous Every 12 hours 10/16/15 1932 10/20/15 1303   10/17/15 0130  metroNIDAZOLE (FLAGYL) IVPB 500 mg  Status:  Discontinued     500 mg 100 mL/hr over 60 Minutes Intravenous Every 8 hours 10/16/15 2147 10/20/15 1304   10/16/15 1730  metroNIDAZOLE (FLAGYL) IVPB 500 mg     500 mg 100 mL/hr over 60 Minutes Intravenous  Once 10/16/15 1721 10/16/15 1923   10/16/15 1730  cefTRIAXone (ROCEPHIN) 1 g in dextrose 5 % 50 mL IVPB     1 g 100 mL/hr over 30 Minutes Intravenous  Once 10/16/15 1721 10/16/15 1902   10/16/15 1730  vancomycin (VANCOCIN) 2,000 mg in sodium chloride 0.9 % 500 mL IVPB     2,000 mg 250 mL/hr over 120 Minutes Intravenous  Once 10/16/15 1727 10/16/15 2222       Assessment/Plan 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 Dwain Sarna, closure of abdominal wall, wound vac and  ileostomy - 06/20/15, Dr. Donell Beers; after CABG 05/22/15, Dr. Kathlee Nations Trigt. S/p hospitalized in [facility] and [facility] 10/13-11/11/16 for intraabdominal abscess drainage, possible stump breakdown. IR drain placement 10/17/15 for new abdominal abscess Drain injection negative for fistula or abscess.  Water soluble enema without definitive leak or fistula.  Will likely ask IR to remove drain, but will verify with attending first. ? Can antibiotics be stopped.  Stage IV sacral decubitus-clean.  Continue BID wet to dry dressing changes.  Prealbumin 1 week ago 6.  Repeat.  Flap when prealbumin >16 per Dr. Ulice Bold Dispo-stable for DC to SNF from a surgicals standpoint   Ashok Norris, ANP-BC [facility]   10/27/2015 9:12 AM

## 2015-10-27 NOTE — Progress Notes (Signed)
    [facility]   Reason for visit: Follow up on intra-abdominal abscess  Interval History:  CT improved, no abscess noted; possible leak  Physical Exam: Constitutional:  Filed Vitals:   10/27/15 0358 10/27/15 1500  BP: 123/62   Pulse: 73 77  Temp: 96.1 F (35.6 C) 97.8 F (36.6 C)  Resp: 18 20   patient appears in NAD Respiratory: Normal respiratory effort; CTA B Cardiovascular: RRR  Review of Systems: Constitutional: negative for chills and malaise Gastrointestinal: negative for nausea and diarrhea  Lab Results  Component Value Date   WBC 14.6* 10/27/2015   HGB 10.1* 10/27/2015   HCT 32.3* 10/27/2015   MCV 87.8 10/27/2015   PLT 349 10/27/2015    Lab Results  Component Value Date   CREATININE 0.56* 10/23/2015   BUN 17 10/20/2015   NA 133* 10/20/2015   K 4.8 10/20/2015   CL 110 10/20/2015   CO2 20* 10/20/2015    Lab Results  Component Value Date   ALT 14* 10/20/2015   AST 13* 10/20/2015   ALKPHOS 102 10/20/2015     Microbiology: Recent Results (from the past 240 hour(s))  MRSA PCR Screening     Status: None   Collection Time: 10/18/15  8:32 PM  Result Value Ref Range Status   MRSA by PCR NEGATIVE NEGATIVE Final    Comment:        The GeneXpert MRSA Assay (FDA approved for NASAL specimens only), is one component of a comprehensive MRSA colonization surveillance program. It is not intended to diagnose MRSA infection nor to guide or monitor treatment for MRSA infections.     Impression/Plan:  1. Intra-abdominal abscess - on ceftriaxone and flagyl along with vancomycin.  Likely mixed. Vancomycin due to gram stain with GPC in chains, concern for continued Enterococcus.  With the leak and continued drain, I will have him continue with antibiotics and he can go out to rehab or home on keflex 500 mg 4 times a day and flagyl 500 mg 3 times a day for another 2 weeks.  If the drain remains in place, this may need to be extended.    2. Nutrition - improved, continuing to increase protein intake.

## 2015-10-27 NOTE — Progress Notes (Signed)
Occupational Therapy Treatment Patient Details Name: Jonathan Le MRN: [id number] DOB: 04/13/1946 Today's Date: 10/27/2015    History of present illness Jonathan Le is a 69 y.o. male with a history of CAD S/P CABG, CHF, HTN, Hx DVT n, OSA, and Glaucoma with a complex recent surgical history who [facility]  due to fevers and chills for the last 2 weeks . CT scan which revealed a large abscess 16 x 10 x 21 cm and he was sent to the ED for admission and treatment. prior history:.After CABG in 7/16,Patient has been in LTACH, CIR, and lastly at [facility].. Has a sacral dcubitus thet was treated in past with PLS and VAC attempts per patient. Has a colostomy and L drain from abdomen.   OT comments  Patient is progressing towards OT goals, continue plan of care for now. Pt is very motivated to regain his strength and overall independence. Pt found seated EOB upon OT entering room. Pt eager to work with therapist and eager to remain seated EOB upon OT exiting room. Pt will benefit from interdisciplinary and extensive CIR prior to discharging home.    Follow Up Recommendations  CIR;Supervision/Assistance - 24 hour    Equipment Recommendations  Other (comment) (TBD)    Recommendations for Other Services  None at this time   Precautions / Restrictions Precautions Precautions: Fall Precaution Comments: sacral decub, drain lines from abdomen; knee pain, colostomy Other Brace/Splint: hinged knee brace on the RLE for comfort per PT note from previous admit- not present Restrictions Weight Bearing Restrictions: No       Balance Overall balance assessment: Needs assistance Sitting-balance support: No upper extremity supported;Feet supported Sitting balance-Leahy Scale: Good     ADL Overall ADL's : Needs assistance/impaired General ADL Comments: Pt found seated EOB, engaged patient in theraband exercise using level 1 orange band. Pt continues to be  very motivated to regain his strength and independence. Pt will benefit from CIR.      Cognition   Behavior During Therapy: WFL for tasks assessed/performed Overall Cognitive Status: Within Functional Limits for tasks assessed      Exercises General Exercises - Upper Extremity Shoulder Flexion: Strengthening;Seated;Theraband;10 reps;Both (X2) Theraband Level (Shoulder Flexion): Level 1 (Yellow) Shoulder Extension: Strengthening;Seated;Theraband;10 reps;Both (X2) Theraband Level (Shoulder Extension): Level 1 (Yellow) Shoulder ABduction: Strengthening;Both;10 reps;Theraband;Seated (X2) Theraband Level (Shoulder Abduction): Level 1 (Yellow) Shoulder Horizontal ABduction: Strengthening;Both;10 reps;Theraband;Seated (X2) Theraband Level (Shoulder Horizontal Abduction): Level 1 (Yellow)           Pertinent Vitals/ Pain       Pain Assessment: Faces Faces Pain Scale: Hurts a little bit Pain Location: buttocks Pain Descriptors / Indicators: Discomfort;Constant;Sore Pain Intervention(s): Monitored during session   Frequency Min 2X/week     Progress Toward Goals  OT Goals(current goals can now befound in the care plan section)  Progress towards OT goals: Progressing toward goals     Plan Discharge plan needs to be updated    Activity Tolerance Patient tolerated treatment well  Patient Left with call bell/phone within reach;with bed alarm set (seated EOB)   Time: 1402-1430 OT Time Calculation (min): 28 min  Charges: OT General Charges $OT Visit: 1 Procedure OT Treatments $Therapeutic Exercise: 23-37 mins  Edwin CapPatricia Karcyn Menn , MS, OTR/L, CLT Pager: [phone]  10/27/2015, 3:27 PM

## 2015-10-27 NOTE — Progress Notes (Signed)
Patient ID: Jonathan Le, male   DOB: 31-Dec-1945, 69 y.o.   MRN: [id number]           Subjective: Pt without new complaints.   Allergies: Unasyn; Levaquin; Penicillins; and Doxycycline  Medications: Prior to Admission medications   Medication Sig Start Date End Date Taking? Authorizing Provider  acetaminophen (TYLENOL) 325 MG tablet Take 2 tablets (650 mg total) by mouth 2 (two) times daily with breakfast and lunch. 09/24/15  Yes Evlyn Kanner Love, PA-C  Amino Acids-Protein Hydrolys (FEEDING SUPPLEMENT, PRO-STAT SUGAR FREE 64,) LIQD Take 30 mLs by mouth 3 (three) times daily. 09/24/15  Yes Evlyn Kanner Love, PA-C  antiseptic oral rinse (BIOTENE) LIQD 15 mLs by Mouth Rinse route 3 (three) times daily after meals. 09/24/15  Yes Evlyn Kanner Love, PA-C  diclofenac sodium (VOLTAREN) 1 % GEL Apply 2 g topically 4 (four) times daily. 09/24/15  Yes Evlyn Kanner Love, PA-C  famotidine (PEPCID) 20 MG tablet Take 1 tablet (20 mg total) by mouth 2 (two) times daily. 09/24/15  Yes Evlyn Kanner Love, PA-C  finasteride (PROSCAR) 5 MG tablet Take 1 tablet (5 mg total) by mouth daily. 09/24/15  Yes Evlyn Kanner Love, PA-C  furosemide (LASIX) 40 MG tablet Take 1 tablet (40 mg total) by mouth daily. 09/24/15  Yes Evlyn Kanner Love, PA-C  HYDROcodone-acetaminophen (NORCO/VICODIN) 5-325 MG tablet Take 1 tablet by mouth every 6 (six) hours as needed for moderate pain.   Yes Historical Provider, MD  magnesium oxide (MAG-OX) 400 (241.3 MG) MG tablet Take 2 tablets (800 mg total) by mouth 2 (two) times daily. 09/24/15  Yes Evlyn Kanner Love, PA-C  Menthol-Methyl Salicylate (MUSCLE RUB) 10-15 % CREA Apply 1 application topically 3 (three) times daily before meals. 09/24/15  Yes Evlyn Kanner Love, PA-C  Multiple Vitamins-Minerals (DECUBI-VITE PO) Take 1 tablet by mouth daily.   Yes Historical Provider, MD  ondansetron (ZOFRAN) 4 MG tablet Take 1 tablet (4 mg total) by mouth every 6 (six) hours as needed for nausea. 09/24/15  Yes Evlyn Kanner Love,  PA-C  pantoprazole (PROTONIX) 40 MG tablet Take 40 mg by mouth daily.   Yes Historical Provider, MD  potassium chloride (K-DUR,KLOR-CON) 10 MEQ tablet Take 30 mEq by mouth 2 (two) times daily.   Yes Historical Provider, MD  PRESCRIPTION MEDICATION Take 120 mLs by mouth 2 (two) times daily. Medpass   Yes Historical Provider, MD  tamsulosin (FLOMAX) 0.4 MG CAPS capsule Take 1 capsule (0.4 mg total) by mouth daily after supper. 09/24/15  Yes Evlyn Kanner Love, PA-C  warfarin (COUMADIN) 5 MG tablet Take 1 tablet (5 mg total) by mouth daily at 6 PM. 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 Patient also has a sacral decubitus ulcer. EXAM: CT ABDOMEN AND PELVIS WITH CONTRAST TECHNIQUE: Multidetector CT imaging of the abdomen and pelvis was performed  using the standard protocol following bolus administration of intravenous contrast. CONTRAST:  OMNIPAQUE IOHEXOL 300 MG/ML IV. Oral contrast was also administered. COMPARISON:  Multiple prior CT abdomen and pelvis examinations dating back to 06/18/2015, most recently 10/17/2015 at the time of drainage catheter placement. FINDINGS: Lower chest: Bilateral pleural effusions, right greater than left, with associated passive atelectasis in the lower lobes, increased since the 10/16/2015 examination. Calcified granuloma again  noted in the left lower lobe. Heart enlarged with extensive 3 vessel coronary atherosclerosis. Hepatobiliary: Liver normal in size. Relative enlargement of the left lobe and caudate lobe compared to the right lobe. Gallbladder contracted, accounting for the mucosal enhancement. No calcified gallstones. No biliary ductal dilation. Pancreas: Normal in appearance without evidence of mass, ductal dilation, or inflammation. Spleen: Normal in size and appearance. Adrenals/Urinary Tract: Normal appearing adrenal glands. Nonobstructing bilateral renal calculi as noted previously. Kidneys otherwise normal in appearance. Normal-appearing urinary bladder. Stomach/Bowel: Stomach normal in appearance for the degree of distention. Wall thickening and edema involving several loops of what are likely mid and distal jejunum. Small extraluminal gas bubbles adjacent to an abnormal loop in the left upper pelvis. Remainder of the small bowel normal in appearance. Normal-appearing ileostomy in the right periumbilical region. Fluid-filled Hartmann pouch as noted previously. Right paramedian sacral decubitus ulcer. Vascular/Lymphatic: Mild to moderate aortoiliofemoral atherosclerosis without aneurysm. Widely patent visceral arteries. 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 Reproductive: Moderate to marked prostate gland enlargement, unchanged. Other: Edema involving the small bowel mesentery. Previously identified abscess completely drained, with no residual fluid collection. No new abscess. Severe generalized body wall edema. Musculoskeletal: Degenerative disc disease, spondylosis and facet degenerative changes diffusely throughout the lumbar spine with severe multifactorial spinal stenosis at L2-3 and L4-5 and moderate multifactorial spinal stenosis at L3-4. Bilateral hip joint effusions. IMPRESSION: 1. No evidence of residual abdominal abscess after percutaneous drainage. No new abscess elsewhere. 2. Wall  thickening/edema involving a several cm segment of mid and distal jejunum, associated with edema in the small bowel mesentery. Extraluminal gas bubbles in the left upper pelvis are in proximity to one of these abnormal bowel loops and may indicate microperforation. 3. Satisfactory appearance of the ileostomy in the right upper pelvis. 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 5. Bilateral pleural effusions, right greater than left, with associated passive atelectasis in the lower lobes. 6. Anasarca. 7. Moderate to marked prostate gland enlargement. 8. Osseous findings as above, with multilevel multifactorial spinal stenosis. 9. Right paramedian sacral decubitus ulcer with possible osteomyelitis involving the last coccygeal segment. 10. Bilateral hip joint effusions. Electronically Signed   By: Hulan Saas M.D.   On: 10/25/2015 15:50   Ir Sinus/fist Tube Chk-non Gi  10/26/2015  CLINICAL DATA:  History of partial colectomy for ischemic colitis complicated by development of multiple postoperative abdominal fluid collections / abscesses and several ultrasound and fluoroscopic guided percutaneous drainage catheter placements. Patient recently underwent a CT-guided abdominal fluid collection percutaneous drainage catheter placement on 10/17/2015. Subsequent CT scan performed 10/25/2015 demonstrates complete resolution of previously drained intra-abdominal abscess. As such, patient presents today for fluoroscopic guided percutaneous drainage catheter injection. EXAM: SINUS TRACT INJECTION/FISTULOGRAM COMPARISON:  CT abdomen pelvis -10/25/2015; 08/10/2015; 07/29/2015; 07/21/2015; CT-guided abdominal abscess drainage catheter placement- 10/17/2015; 07/21/2015; ultrasound-guided left abdominal wall percutaneous drainage catheter placement - 08/12/2015 CONTRAST:  15 cc Omnipaque 300 FLUOROSCOPY TIME:  1 minute, 12 seconds (65 mGy) TECHNIQUE: The patient was positioned supine on the  fluoroscopy table. A preprocedural spot fluoroscopic  image was obtained of the midline of the abdomen existing solitary percutaneous drainage catheter. Multiple spot fluoroscopic and radiographic images were obtained in various obliquities following the injection of a small amount of contrast. Images were reviewed and the drainage catheter was flushed with a small amount of saline and reconnected to a JP bulb. A dressing was placed. The patient tolerated the procedure well without immediate postprocedural complication. FINDINGS: Preprocedural spot fluoroscopic image demonstrates unchanged positioning of the solitary existing percutaneous drainage catheter with end coiled and locked over the midline of the abdomen. Contrast injection demonstrates opacification of the cheek of breast abscess cavity but was negative for definitive communication with any adjacent loop of large or small intestine. IMPRESSION: Contrast injection of the existing percutaneous drainage catheter is negative for the presence of a fistulous connection to an adjacent loop of large or small bowel. PLAN: Above findings were discussed with referring surgical p.a., Barnetta ChapelKelly Osborne. CCS wants the patient to undergo a contrast-enhanced enema to further evaluate for anastomotic leak prior to removal of the percutaneous drainage catheter. If the contrast-enhanced enema is also negative for fistulous connection to the residual percutaneous drainage catheter, the drainage catheter may be removed at the patient's bedside as per the recommendations of CCS. Electronically Signed   By: Simonne ComeJohn  Watts M.D.   On: 10/26/2015 13:46   Dg Colon W/water Sol Cm  10/26/2015  CLINICAL DATA:  Intra-abdominal abscess. Evaluate for rectal stump leak. EXAM: WATER SOLUBLE CONTRAST ENEMA FLUOROSCOPY TIME:  Radiation Exposure Index (as provided by the fluoroscopic device): 74.6 mGy If the device does not provide the exposure index: Fluoroscopy Time:  0 minutes 57 seconds.  Number of Acquired Images:  12 COMPARISON:  CT abdomen pelvis 10/25/2015. FINDINGS: Scout view of the abdomen shows a percutaneous pigtail catheter terminating in the low central abdomen. Stomach is distended with air. Mild gaseous distention of small bowel. After digital rectal examination, enema tip was inserted into the rectum. Small amount of water-soluble contrast was allowed to infuse under gravity. Rectal stump opacifies. A thin trail of contrast extends from the stump, without opacification of additional bowel or leakage of contrast. Delayed images suggest rectal prolapse. IMPRESSION: 1. Opacification of the rectal stump with a thin trail of contrast extending from the stump. No definitive evidence of a fistulous connection to small bowel. No leak. 2. Suspect rectal prolapse. Electronically Signed   By: Leanna BattlesMelinda  Blietz M.D.   On: 10/26/2015 16:07    Labs:  CBC:  Recent Labs  10/22/15 0449 10/24/15 1222 10/26/15 0715 10/27/15 1015  WBC 13.0* 13.3* 11.6* 14.6*  HGB 9.0* 9.7* 9.5* 10.1*  HCT 29.3* 31.4* 30.5* 32.3*  PLT 476* 437* 367 349    COAGS:  Recent Labs  07/21/15 0815  07/23/15 1200 07/25/15 0419 07/30/15 0627  10/24/15 0510 10/25/15 0533 10/26/15 0715 10/27/15 0419  INR 1.65*  < > 2.62* 1.48 1.35  < > 2.16* 2.30* 2.57* 2.56*  APTT 29  --  38* 28 31  --   --   --   --   --   < > = values in this interval not displayed.  BMP:  Recent Labs  10/17/15 0348 10/18/15 0246 10/19/15 0350 10/20/15 0330 10/23/15 0510  NA 135 136 138 133*  --   K 3.6 3.8 4.2 4.8  --   CL 107 109 113* 110  --   CO2 20* 21* 20* 20*  --   GLUCOSE 108* 93 171* 163*  --   BUN  --   CALCIUM 8.4* 8.1* 8.6* 8.7*  --   CREATININE 0.40* 0.41* 0.46* 0.38* 0.56*  GFRNONAA >60 >60 >60 >60 >60  GFRAA >60 >60 >60 >60 >60    LIVER FUNCTION TESTS:  Recent Labs  07/25/15 0419  08/16/15 0701 08/28/15 0430 10/16/15 1718 10/20/15 0330  BILITOT 0.9  --   --  0.6 0.5 0.2*  AST  35  --   --  22 27 13*  ALT 14*  --   --  10* 29 14*  ALKPHOS 199*  --   --  80 150* 102  PROT 4.5*  --   --  5.2* 5.8* 5.7*  ALBUMIN <1.0*  < > 1.2* 1.6* 1.5* 2.0*  < > = values in this interval not displayed.  Assessment and Plan: Hx partial colectomy for ischemic colitis with mult post op abscesses/drainage procedures with most recent drainage of peritoneal abscess 10/17/15; resolution of abscess noted on 12/11 CT and no fistula to bowel noted on recent drain injection; however there is concern for a contained rectal stump leak noted on gastrografin enema 12/12; afebrile; WBC 14.6, HGB 10.1;  cont drain for now; additional plans as outlined by Dr. Abbey Chatters.    Signed: D. Jeananne Rama 10/27/2015, 3:30 PM   I spent a total of 15 minutes at the the patient's bedside AND on the patient's hospital floor or unit, greater than 50% of which was counseling/coordinating care for abdominal abscess drain

## 2015-10-27 NOTE — Progress Notes (Signed)
Spoke with patient and he is now open to reconsidering SNF- [facility] and would be open to looking at other SNF options also- CSW has sent clinicals to area SNF's for consideration- as well, Patient inquiring about CIR where he was in the past and did well- PT recommending this and CSW has asked MD for CIR consult-  CSW will f/u tomorrow for further dc planning-  Jonathan LevyJanet Dajanay Northrup, MSW, Theresia MajorsLCSWA  [phone]

## 2015-10-28 DIAGNOSIS — E871 Hypo-osmolality and hyponatremia: Secondary | ICD-10-CM

## 2015-10-28 DIAGNOSIS — D638 Anemia in other chronic diseases classified elsewhere: Secondary | ICD-10-CM

## 2015-10-28 DIAGNOSIS — I5022 Chronic systolic (congestive) heart failure: Secondary | ICD-10-CM

## 2015-10-28 DIAGNOSIS — R1084 Generalized abdominal pain: Secondary | ICD-10-CM

## 2015-10-28 LAB — CBC
HCT: 31.4 % — ABNORMAL LOW (ref 39.0–52.0)
Hemoglobin: 9.9 g/dL — ABNORMAL LOW (ref 13.0–17.0)
MCH: 28 pg (ref 26.0–34.0)
MCHC: 31.5 g/dL (ref 30.0–36.0)
MCV: 88.7 fL (ref 78.0–100.0)
PLATELETS: 290 10*3/uL (ref 150–400)
RBC: 3.54 MIL/uL — ABNORMAL LOW (ref 4.22–5.81)
RDW: 19.3 % — AB (ref 11.5–15.5)
WBC: 12.7 10*3/uL — ABNORMAL HIGH (ref 4.0–10.5)

## 2015-10-28 LAB — PROTIME-INR
INR: 2.81 — AB (ref 0.00–1.49)
PROTHROMBIN TIME: 29.1 s — AB (ref 11.6–15.2)

## 2015-10-28 LAB — PREALBUMIN: PREALBUMIN: 25.7 mg/dL (ref 18–38)

## 2015-10-28 LAB — BASIC METABOLIC PANEL
ANION GAP: 4 — AB (ref 5–15)
BUN: 27 mg/dL — AB (ref 6–20)
CALCIUM: 8.6 mg/dL — AB (ref 8.9–10.3)
CO2: 24 mmol/L (ref 22–32)
Chloride: 109 mmol/L (ref 101–111)
Creatinine, Ser: 0.43 mg/dL — ABNORMAL LOW (ref 0.61–1.24)
GFR calc Af Amer: >60 mL/min (ref >60)
GLUCOSE: 137 mg/dL — AB (ref 65–99)
Potassium: 3.2 mmol/L — ABNORMAL LOW (ref 3.5–5.1)
SODIUM: 137 mmol/L (ref 135–145)

## 2015-10-28 MED ORDER — WARFARIN SODIUM 2.5 MG PO TABS
2.5000 mg | ORAL_TABLET | Freq: Once | ORAL | Status: AC
Start: 2015-10-28 — End: 2015-10-28
  Administered 2015-10-28: 2.5 mg via ORAL
  Filled 2015-10-28: qty 1

## 2015-10-28 MED ORDER — PREDNISONE 20 MG PO TABS
40.0000 mg | ORAL_TABLET | Freq: Every day | ORAL | Status: AC
  Administered 2015-10-28: 40 mg via ORAL
  Filled 2015-10-28: qty 2

## 2015-10-28 MED ORDER — PREDNISONE 5 MG PO TABS
30.0000 mg | ORAL_TABLET | Freq: Every day | ORAL | Status: DC
  Administered 2015-10-29: 30 mg via ORAL
  Filled 2015-10-28 (×2): qty 2

## 2015-10-28 NOTE — Progress Notes (Signed)
Pt can place on himself when ready. Pt encouraged to call RT if pt needs any assistance.

## 2015-10-28 NOTE — Progress Notes (Signed)
Patient ID: Jonathan Le, male   DOB: [birth year], 69 y.o.   MRN: [id number]     [name] [place], [place] [address]    Phone: [phone] FAX: [phone]     Subjective: 91m out of drain, serous.  Afebrile.  WBC down.   Objective:  Vital signs:  Filed Vitals:   10/27/15 2027 10/27/15 2215 10/27/15 2305 10/28/15 0521  BP: 115/50  136/77 126/73  Pulse: 80   71  Temp: 97.7 F (36.5 C)   97.6 F (36.4 C)  TempSrc: Oral   Axillary  Resp: '20 18  18  ' Height:      Weight:    114.397 kg (252 lb 3.2 oz)  SpO2: 97%   96%    Last BM Date: 10/27/15  Intake/Output   Yesterday:  12/13 0701 - 12/14 0700 In: 1050 [P.O.:480; IV Piggyback:550] Out: 1997 [Urine:625; Drains:47; SFKCLE:7517]This shift:  Total I/O In: -  Out: 200 [Urine:200]   Physical Exam: General: Pt awake/alert/oriented x4 in no acute distress Abdomen: Soft. Nondistended. Non tender. rlq ostomy functioning. Drain with serous output. No evidence of peritonitis. No incarcerated hernias.  Skin: large stage IV sacral decub, clean, repacked.     Problem List:   Principal Problem:   Abdominal abscess ([place] Active Problems:   OSA on CPAP   CAD, multiple vessel   Abdominal pain, generalized   Sacral decubitus ulcer   Chronic systolic CHF (congestive heart failure) (HCC)   Sepsis (HCC)   Warfarin-induced coagulopathy ([place]   Anemia of chronic disease   Hyponatremia   Hypotension   Acute pulmonary edema (HCC)   Congestive heart failure (HCC)   Hypoxemia   Arterial hypotension   Severe sepsis ([place]    Results:   Labs: Results for orders placed or performed during the hospital encounter of 10/16/15 (from the past 48 hour(s))  Protime-INR     Status: Abnormal   Collection Time: 10/27/15  4:19 AM  Result Value Ref Range   Prothrombin Time 27.1 (H) 11.6 - 15.2 seconds   INR 2.56 (H) 0.00 - 1.49  Prealbumin     Status: None    Collection Time: 10/27/15 10:15 AM  Result Value Ref Range   Prealbumin 24.7 18 - 38 mg/dL    Comment[facility]     Status: Abnormal   Collection Time: 10/27/15 10:15 AM  Result Value Ref Range   WBC 14.6 (H) 4.0 - 10.5 K/uL   RBC 3.68 (L) 4.22 - 5.81 MIL/uL   Hemoglobin 10.1 (L) 13.0 - 17.0 g/dL   HCT 32.3 (L) 39.0 - 52.0 %   MCV 87.8 78.0 - 100.0 fL   MCH 27.4 26.0 - 34.0 pg   MCHC 31.3 30.0 - 36.0 g/dL   RDW 18.9 (H) 11.5 - 15.5 %   Platelets 349 150 - 400 K/uL  Protime-INR     Status: Abnormal   Collection Time: 10/28/15  4:23 AM  Result Value Ref Range   Prothrombin Time 29.1 (H) 11.6 - 15.2 seconds   INR 2.81 (H) 0.00 - 10.01 Basic metabolic panel     Status: Abnormal   Collection Time: 10/28/15  4:23 AM  Result Value Ref Range   Sodium 137 135 - 145 mmol/L   Potassium 3.2 (L) 3.5 - 5.1 mmol/L   Chloride 109 101 - 111 mmol/L   CO2 24 22 -  32 mmol/L   Glucose, Bld 137 (H) 65 - 99 mg/dL   BUN 27 (H) 6 - 20 mg/dL   Creatinine, Ser 0.43 (L) 0.61 - 1.24 mg/dL   Calcium 8.6 (L) 8.9 - 10.3 mg/dL   GFR calc non Af Amer >60 >60 mL/min   GFR calc Af Amer >60 >60 mL/min    Comment: (NOTE) The eGFR has been calculated using the CKD EPI equation. This calculation has not been validated in all clinical situations. eGFR's persistently <60 mL/min signify possible Chronic Kidney Disease.    Anion gap 4 (L) 5 - 15  CBC     Status: Abnormal   Collection Time: 10/28/15  4:23 AM  Result Value Ref Range   WBC 12.7 (H) 4.0 - 10.5 K/uL   RBC 3.54 (L) 4.22 - 5.81 MIL/uL   Hemoglobin 9.9 (L) 13.0 - 17.0 g/dL   HCT 31.4 (L) 39.0 - 52.0 %   MCV 88.7 78.0 - 100.0 fL   MCH 28.0 26.0 - 34.0 pg   MCHC 31.5 30.0 - 36.0 g/dL   RDW 19.3 (H) 11.5 - 15.5 %   Platelets 290 150 - 400 K/uL    Imaging / Studies: Ir Sinus/fist Tube Chk-non Gi  10/26/2015  CLINICAL DATA:  History of partial colectomy for ischemic colitis complicated by development of multiple  postoperative abdominal fluid collections / abscesses and several ultrasound and fluoroscopic guided percutaneous drainage catheter placements. Patient recently underwent a CT-guided abdominal fluid collection percutaneous drainage catheter placement on 10/17/2015. Subsequent CT scan performed 10/25/2015 demonstrates complete resolution of previously drained intra-abdominal abscess. As such, patient presents today for fluoroscopic guided percutaneous drainage catheter injection. EXAM: SINUS TRACT INJECTION/FISTULOGRAM COMPARISON:  CT abdomen pelvis -10/25/2015; 08/10/2015; 07/29/2015; 07/21/2015; CT-guided abdominal abscess drainage catheter placement- 10/17/2015; 07/21/2015; ultrasound-guided left abdominal wall percutaneous drainage catheter placement - 08/12/2015 CONTRAST:  15 cc Omnipaque 300 FLUOROSCOPY TIME:  1 minute, 12 seconds (65 mGy) TECHNIQUE: The patient was positioned supine on the fluoroscopy table. A preprocedural spot fluoroscopic image was obtained of the midline of the abdomen existing solitary percutaneous drainage catheter. Multiple spot fluoroscopic and radiographic images were obtained in various obliquities following the injection of a small amount of contrast. Images were reviewed and the drainage catheter was flushed with a small amount of saline and reconnected to a JP bulb. A dressing was placed. The patient tolerated the procedure well without immediate postprocedural complication. FINDINGS: Preprocedural spot fluoroscopic image demonstrates unchanged positioning of the solitary existing percutaneous drainage catheter with end coiled and locked over the midline of the abdomen. Contrast injection demonstrates opacification of the cheek of breast abscess cavity but was negative for definitive communication with any adjacent loop of large or small intestine. IMPRESSION: Contrast injection of the existing percutaneous drainage catheter is negative for the presence of a fistulous connection  to an adjacent loop of large or small bowel. PLAN: Above findings were discussed with referring surgical p.a., Saverio Danker. CCS wants the patient to undergo a contrast-enhanced enema to further evaluate for anastomotic leak prior to removal of the percutaneous drainage catheter. If the contrast-enhanced enema is also negative for fistulous connection to the residual percutaneous drainage catheter, the drainage catheter may be removed at the patient's bedside as per the recommendations of CCS. Electronically Signed   By: Sandi Mariscal M.D.   On: 10/26/2015 13:46   Dg Colon W/water Sol Cm  10/26/2015  CLINICAL DATA:  Intra-abdominal abscess. Evaluate for rectal stump leak. EXAM: WATER SOLUBLE CONTRAST ENEMA  FLUOROSCOPY TIME:  Radiation Exposure Index (as provided by the fluoroscopic device): 74.6 mGy If the device does not provide the exposure index: Fluoroscopy Time:  0 minutes 57 seconds. Number of Acquired Images:  12 COMPARISON:  CT abdomen pelvis 10/25/2015. FINDINGS: Scout view of the abdomen shows a percutaneous pigtail catheter terminating in the low central abdomen. Stomach is distended with air. Mild gaseous distention of small bowel. After digital rectal examination, enema tip was inserted into the rectum. Small amount of water-soluble contrast was allowed to infuse under gravity. Rectal stump opacifies. A thin trail of contrast extends from the stump, without opacification of additional bowel or leakage of contrast. Delayed images suggest rectal prolapse. IMPRESSION: 1. Opacification of the rectal stump with a thin trail of contrast extending from the stump. No definitive evidence of a fistulous connection to small bowel. No leak. 2. Suspect rectal prolapse. Electronically Signed   By: Lorin Picket M.D.   On: 10/26/2015 16:07    Medications / Allergies:  Scheduled Meds: . antiseptic oral rinse  15 mL Mouth Rinse TID PC  . cefTRIAXone (ROCEPHIN)  IV  2 g Intravenous Q24H  . furosemide  40 mg  Oral Daily  . magnesium oxide  800 mg Oral BID  . metronidazole  500 mg Intravenous Q8H  . multivitamin with minerals  1 tablet Oral Daily  . nutrition supplement (JUVEN)  1 packet Oral BID BM  . pantoprazole  40 mg Oral Daily  . potassium chloride  30 mEq Oral BID  . [START ON 10/29/2015] predniSONE  30 mg Oral Q breakfast  . predniSONE  40 mg Oral Q breakfast  . protein supplement  2 oz Oral QID  . vancomycin  750 mg Intravenous Q12H  . warfarin  2.5 mg Oral ONCE-1800  . Warfarin - Pharmacist Dosing Inpatient   Does not apply q1800   Continuous Infusions:  PRN Meds:.acetaminophen **OR** acetaminophen, alum & mag hydroxide-simeth, HYDROmorphone (DILAUDID) injection, ondansetron **OR** ondansetron (ZOFRAN) IV, oxyCODONE  Antibiotics: Anti-infectives    Start     Dose/Rate Route Frequency Ordered Stop   10/26/15 2000  vancomycin (VANCOCIN) IVPB 750 mg/150 ml premix     750 mg 150 mL/hr over 60 Minutes Intravenous Every 12 hours 10/26/15 0835     10/20/15 2000  vancomycin (VANCOCIN) IVPB 1000 mg/200 mL premix     1,000 mg 200 mL/hr over 60 Minutes Intravenous Every 12 hours 10/20/15 1606 10/26/15 0957   10/20/15 1630  metroNIDAZOLE (FLAGYL) IVPB 500 mg    Comments:  Pharmacy may adjust   500 mg 100 mL/hr over 60 Minutes Intravenous Every 8 hours 10/20/15 1541     10/20/15 1330  cefTRIAXone (ROCEPHIN) 2 g in dextrose 5 % 50 mL IVPB     2 g 100 mL/hr over 30 Minutes Intravenous Every 24 hours 10/20/15 1304     10/18/15 0400  ceFEPIme (MAXIPIME) 2 g in dextrose 5 % 50 mL IVPB  Status:  Discontinued     2 g 100 mL/hr over 30 Minutes Intravenous 3 times per day 10/18/15 0314 10/20/15 1304   10/17/15 1730  cefTRIAXone (ROCEPHIN) 1 g in dextrose 5 % 50 mL IVPB  Status:  Discontinued     1 g 100 mL/hr over 30 Minutes Intravenous Every 24 hours 10/16/15 2146 10/17/15 0008   10/17/15 1000  cefTRIAXone (ROCEPHIN) 2 g in dextrose 5 % 50 mL IVPB  Status:  Discontinued     2 g 100 mL/hr over  30 Minutes  Intravenous Every 24 hours 10/17/15 0008 10/18/15 0236   10/17/15 0800  vancomycin (VANCOCIN) IVPB 1000 mg/200 mL premix  Status:  Discontinued     1,000 mg 200 mL/hr over 60 Minutes Intravenous Every 12 hours 10/16/15 1932 10/20/15 1303   10/17/15 0130  metroNIDAZOLE (FLAGYL) IVPB 500 mg  Status:  Discontinued     500 mg 100 mL/hr over 60 Minutes Intravenous Every 8 hours 10/16/15 2147 10/20/15 1304   10/16/15 1730  metroNIDAZOLE (FLAGYL) IVPB 500 mg     500 mg 100 mL/hr over 60 Minutes Intravenous  Once 10/16/15 1721 10/16/15 1923   10/16/15 1730  cefTRIAXone (ROCEPHIN) 1 g in dextrose 5 % 50 mL IVPB     1 g 100 mL/hr over 30 Minutes Intravenous  Once 10/16/15 1721 10/16/15 1902   10/16/15 1730  vancomycin (VANCOCIN) 2,000 mg in sodium chloride 0.9 % 500 mL IVPB     2,000 mg 250 mL/hr over 120 Minutes Intravenous  Once 10/16/15 1727 10/16/15 2222        Assessment/Plan 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 Donne Hazel, closure of abdominal wall, wound vac and ileostomy - 06/20/15, Dr. Barry Dienes; after CABG 05/22/15, Dr. Tharon Aquas Trigt. S/p hospitalized in [facility] and [facility] 10/13-11/11/16 for intraabdominal abscess drainage, stump breakdown. IR drain placement 10/17/15 for new abdominal abscess After further review, gastrografin enema demonstrates a rectal stump leak.  With this, continue with drain.  Plan to repeat gastrografin enema in 2-3 weeks.  Could take 6-12 weeks to heal, it it will heal. Wean steroids to aid in healing Antibiotics per ID, can stop after 14 days total from a surgical standpoint  Stage IV sacral decubitus-clean. Continue BID wet to dry dressing changes. Prealbumin 24, not sure if this is accurate, repeat.  Per Dr. Marla Roe peralbumin >16 before consideration for flap.  Flap when prealbumin >16 per Dr. Marla Roe Dispo-stable for DC to SNF/CIR from surgical standpoint   Erby Pian, ANP-BC [facility]   10/28/2015 8:49 AM

## 2015-10-28 NOTE — Progress Notes (Signed)
Thank you for consult on Jonathan Le. He is well known to CIR from prolonged stay due multiple medical issues, right knee OA as well as poor endurance. He was barely able to tolerate 3 hrs day of therapy and he lacks social supports. [facility].

## 2015-10-28 NOTE — Progress Notes (Signed)
Rehab admissions - Please see note from Delle ReiningPamela Love, [place] with rehab.  Patient not a candidate for inpatient rehab admission at this time.  #[phone]

## 2015-10-28 NOTE — Progress Notes (Signed)
    [facility]   Reason for visit: Follow up on intra-abdominal abscess  Interval History:  No changes.  No fever, no chills.   Physical Exam: Constitutional:  Filed Vitals:   10/28/15 0521 10/28/15 1440  BP: 126/73 134/74  Pulse: 71 86  Temp: 97.6 F (36.4 C) 97.5 F (36.4 C)  Resp: 18 21   patient appears in NAD Respiratory: Normal respiratory effort; CTA B Cardiovascular: RRR  Review of Systems: Constitutional: negative for chills and malaise Gastrointestinal: negative for nausea and diarrhea  Lab Results  Component Value Date   WBC 12.7* 10/28/2015   HGB 9.9* 10/28/2015   HCT 31.4* 10/28/2015   MCV 88.7 10/28/2015   PLT 290 10/28/2015    Lab Results  Component Value Date   CREATININE 0.43* 10/28/2015   BUN 27* 10/28/2015   NA 137 10/28/2015   K 3.2* 10/28/2015   CL 109 10/28/2015   CO2 24 10/28/2015    Lab Results  Component Value Date   ALT 14* 10/20/2015   AST 13* 10/20/2015   ALKPHOS 102 10/20/2015     Microbiology: Recent Results (from the past 240 hour(s))  MRSA PCR Screening     Status: None   Collection Time: 10/18/15  8:32 PM  Result Value Ref Range Status   MRSA by PCR NEGATIVE NEGATIVE Final    Comment:        The GeneXpert MRSA Assay (FDA approved for NASAL specimens only), is one component of a comprehensive MRSA colonization surveillance program. It is not intended to diagnose MRSA infection nor to guide or monitor treatment for MRSA infections.     Impression/Plan:  1. Intra-abdominal abscess - on ceftriaxone and flagyl along with vancomycin.  Likely mixed. Vancomycin due to gram stain with GPC in chains, concern for continued Enterococcus.  With the leak and continued drain, I will have him continue with antibiotics and he can go out to rehab or home on keflex 500 mg 4 times a day and flagyl 500 mg 3 times a day for another 2 weeks.  If the drain remains in place, this may need to be extended.  2.  Nutrition - improved, continuing to increase protein intake.

## 2015-10-28 NOTE — Progress Notes (Signed)
Physical Therapy Treatment Patient Details Name: Jonathan Le MRN: [id number] DOB: [birth year] Today's Date: 10/28/2015    History of Present Illness Jonathan Le is a 69 y.o. male with a history of CAD S/P CABG, CHF, HTN, Hx DVT n, OSA, and Glaucoma with a complex recent surgical history who was sent from the [facility] 10/16/15  due to fevers and chills for the last 2 weeks . CT scan which revealed a large abscess 16 x 10 x 21 cm and he was sent to the ED for admission and treatment. prior history:.After CABG in 7/16,Patient has been in LTACH, CIR, and lastly at [facility].. Has a sacral dcubitus thet was treated in past with PLS and VAC attempts per patient. Has a colostomy and L drain from abdomen.    PT Comments    Pt was able to stand briefly ~20 seconds at EOB with +3 assist. Sit to stand trial x3. Fatigues very quickly. Recommend return to SNF.   Follow Up Recommendations  SNF     Equipment Recommendations  None recommended by PT    Recommendations for Other Services       Precautions / Restrictions Precautions Precautions: Fall Precaution Comments: sacral decub, drain lines from abdomen; knee pain, colostomy Restrictions Weight Bearing Restrictions: No    Mobility  Bed Mobility Overal bed mobility: Needs Assistance Bed Mobility: Rolling;Sit to Supine Rolling: Mod assist   Supine to sit: Max assist;+2 for safety/equipment;HOB elevated;+2 for physical assistance   Sit to supine: Max assist;+2 for safety/equipment;+2 for physical assistance;HOB elevated General bed mobility comments: Assist for trunk and bil LEs. Increased time. Utilized bedpad for scooting, positioning. Pt puts forth max effort but still requires significant assist.  Transfers Overall transfer level: Needs assistance   Transfers: Sit to/from Stand Sit to Stand: Total assist;+3 physical assistance;+3 safety/equipment;From elevated surface         General transfer comment:  (+3 assist). Sit to stand x 3 attempts (with back of recliner in front)-first trial,pt was able to stand for ~20 seconds before fatigue. 2nd and 3rd attempts less successful-barely able to clear bed surface.   Ambulation/Gait                 Stairs            Wheelchair Mobility    Modified Rankin (Stroke Patients Only)       Balance   Sitting-balance support: Feet supported;Bilateral upper extremity supported Sitting balance-Leahy Scale: Good     Standing balance support: During functional activity Standing balance-Leahy Scale: Zero                      Cognition Arousal/Alertness: Awake/alert Behavior During Therapy: WFL for tasks assessed/performed Overall Cognitive Status: Within Functional Limits for tasks assessed                      Exercises General Exercises - Lower Extremity Ankle Circles/Pumps: AROM;Both;10 reps;Supine Quad Sets: AROM;Both;10 reps;Supine Heel Slides: AAROM;10 reps;Supine;Both Hip ABduction/ADduction: AAROM;Supine;Both Straight Leg Raises: AAROM;Both;10 reps;Supine    General Comments        Pertinent Vitals/Pain Pain Assessment: Faces Faces Pain Scale: Hurts little more Pain Location: buttocks, R knee with activity Pain Descriptors / Indicators: Aching;Sore;Discomfort Pain Intervention(s): Monitored during session;Repositioned    Home Living                      Prior Function  PT Goals (current goals can now be found in the care plan section) Progress towards PT goals: Progressing toward goals    Frequency  Min 2X/week    PT Plan Current plan remains appropriate    Co-evaluation             End of Session   Activity Tolerance: Patient tolerated treatment well Patient left: in bed;with call bell/phone within reach;with bed alarm set     Time: [id number] PT Time Calculation (min) (ACUTE ONLY): 31 min  Charges:  $Therapeutic Exercise: 8-22 mins $Therapeutic  Activity: 8-22 mins                    G Codes:      Rebeca AlertJannie Joslin Doell, MPT Pager: [phone]

## 2015-10-28 NOTE — Progress Notes (Signed)
[facility] PROGRESS NOTE  Jonathan Le ZOX:[id number] DOB: 28-Aug-1946 DOA: 10/16/2015 PCP: Elizabeth Palau, FNP  HPI/Brief narrative 69 year old male with medical history of CAD status post CABG in July 2016, CHF with EF of 30-35 %, hypertension and history of DVT on Coumadin, obstructive sleep apnea on CPAP, glaucoma, stage 4 sacral decubitus, development of ischemic colitis in August 2016 for which he underwent excoriated a laparotomy with total colectomy and ileostomy with prolonged intubation requiring tracheostomy, followed by a complicated course with multiple abdominal and pelvic abscesses that have been drained. His last CT scan from 09/18/2015 showed fluid collection to have resolved and drains were removed. Patient was then sent to [facility]. For the last 2 weeks he was having vague abdominal pain with occasional fever. He was found to have a UTI and was started on antibiotics. However for the past 2 days he was having low blood pressure. He was sent for outpatient CT scan of the abdomen which showed a large central abdominal abscess measuring 161021 cm and was sent to the ED. Patient was found to be in septic shock with fever of 102.35F, tachycardic, hypotensive (blood pressure 83/37 mmHg) and significant leukocytosis (16.9 K). Patient admitted to stepdown unit on empiric antibiotics. General surgery consulted . Patient had drainage of his abdominal abscess by IR on 12/3.  Assessment/Plan: Recurrent Intra-abdominal abscess with sepsis/septic shock -Sepsis resolved. -CT drainage of the abdominal abscess by IR on 12/3.Marland Kitchen Continuous significant output from the drain. Cultures growing Escherichia coli which is pansensitive. For now, continued on vancomycin with ceftriaxone and Flagyl. - ID following. Patient was treated with Unasyn followed by Augmentin for enterococcus on the abscess culture upon discharge in October 2016 -The source of abscess is likely rectal  stump leak, per [facility] continue drain and FU with [facility] for Drain study and gastriograffin enema in 6 weeks. -per ID, could Dc Home on keflex 500 mg 4 times a day and flagyl 500 mg 3 times a day for another 2 weeks -Wean stress dose steroids, Prednisone taper  CAD status post CABG with cardiomyopathy Patient had NSTEMI in July 2016 with cardiogenic shock . Three-vessel disease seen on cardiac cath with EF of 30-35% He underwent CABG. Currently has some volume overload. Off basal ivf. On low-dose Lasix. -stable, continue same  Stage IV sacral decubitus ulcer Wound care consult appreciated. As per surgery who spoke with plastic surgeon Dr. Kelly Splinter he may qualify to get a surgical flap for the decubitus ulcer once nutritional status is satisfactory. This will have to be done at [facility]. Prealbumin up significantly likely lab error will repeat, Fu with Dr.Sanger, continue wound care for now  Anemia of chronic disease -stable  History of DVT on chronic warfarin Given 3 unit FFP on admission for reversal. . H&H stable. resumed coumadin.  OSA Continue nighttime CPAP  Protein calorie malnutrition Nutritionist consulted. Switched nutritional supplements  DVT prophylaxis: Therapeutic INR  Code Status: full Family Communication: Pt in room Disposition Plan: SNF tomorrow  Consultants:  CCS  PCCM  IR  ID  Procedures:  CT drainage of abdominal abscess on 12/4  Antibiotics: Anti-infectives    Start     Dose/Rate Route Frequency Ordered Stop   10/26/15 2000  vancomycin (VANCOCIN) IVPB 750 mg/150 ml premix     750 mg 150 mL/hr over 60 Minutes Intravenous Every 12 hours 10/26/15 0835     10/20/15 2000  vancomycin (VANCOCIN) IVPB 1000 mg/200 mL premix     1,000 mg 200 mL/hr over  60 Minutes Intravenous Every 12 hours 10/20/15 1606 10/26/15 0957   10/20/15 1630  metroNIDAZOLE (FLAGYL) IVPB 500 mg    Comments:  Pharmacy may adjust   500 mg 100 mL/hr over 60 Minutes Intravenous Every 8  hours 10/20/15 1541     10/20/15 1330  cefTRIAXone (ROCEPHIN) 2 g in dextrose 5 % 50 mL IVPB     2 g 100 mL/hr over 30 Minutes Intravenous Every 24 hours 10/20/15 1304     10/18/15 0400  ceFEPIme (MAXIPIME) 2 g in dextrose 5 % 50 mL IVPB  Status:  Discontinued     2 g 100 mL/hr over 30 Minutes Intravenous 3 times per day 10/18/15 0314 10/20/15 1304   10/17/15 1730  cefTRIAXone (ROCEPHIN) 1 g in dextrose 5 % 50 mL IVPB  Status:  Discontinued     1 g 100 mL/hr over 30 Minutes Intravenous Every 24 hours 10/16/15 2146 10/17/15 0008   10/17/15 1000  cefTRIAXone (ROCEPHIN) 2 g in dextrose 5 % 50 mL IVPB  Status:  Discontinued     2 g 100 mL/hr over 30 Minutes Intravenous Every 24 hours 10/17/15 0008 10/18/15 0236   10/17/15 0800  vancomycin (VANCOCIN) IVPB 1000 mg/200 mL premix  Status:  Discontinued     1,000 mg 200 mL/hr over 60 Minutes Intravenous Every 12 hours 10/16/15 1932 10/20/15 1303   10/17/15 0130  metroNIDAZOLE (FLAGYL) IVPB 500 mg  Status:  Discontinued     500 mg 100 mL/hr over 60 Minutes Intravenous Every 8 hours 10/16/15 2147 10/20/15 1304   10/16/15 1730  metroNIDAZOLE (FLAGYL) IVPB 500 mg     500 mg 100 mL/hr over 60 Minutes Intravenous  Once 10/16/15 1721 10/16/15 1923   10/16/15 1730  cefTRIAXone (ROCEPHIN) 1 g in dextrose 5 % 50 mL IVPB     1 g 100 mL/hr over 30 Minutes Intravenous  Once 10/16/15 1721 10/16/15 1902   10/16/15 1730  vancomycin (VANCOCIN) 2,000 mg in sodium chloride 0.9 % 500 mL IVPB     2,000 mg 250 mL/hr over 120 Minutes Intravenous  Once 10/16/15 1727 10/16/15 2222      HPI/Subjective: Feels ok  Objective: Filed Vitals:   10/27/15 2027 10/27/15 2215 10/27/15 2305 10/28/15 0521  BP: 115/50  136/77 126/73  Pulse: 80   71  Temp: 97.7 F (36.5 C)   97.6 F (36.4 C)  TempSrc: Oral   Axillary  Resp: 20 18  18   Height:      Weight:    114.397 kg (252 lb 3.2 oz)  SpO2: 97%   96%    Intake/Output Summary (Last 24 hours) at 10/28/15 1238 Last  data filed at 10/28/15 1100  Gross per 24 hour  Intake   1070 ml  Output   2012 ml  Net   -942 ml   Filed Weights   10/26/15 0451 10/27/15 0358 10/28/15 0521  Weight: 117.1 kg (258 lb 2.5 oz) 113.7 kg (250 lb 10.6 oz) 114.397 kg (252 lb 3.2 oz)    Exam:   General:  Awake, alert, Oriented x3 no distress  Cardiovascular: regular, s1, s2  Respiratory: normal resp effort, no wheezing  Abdomen: soft,nondistended, obese  Musculoskeletal: perfused, no clubbing   Data Reviewed: Basic Metabolic Panel:  Recent Labs Lab 10/23/15 0510 10/28/15 0423  NA  --  137  K  --  3.2*  CL  --  109  CO2  --  24  GLUCOSE  --  137*  BUN  --  27*  CREATININE 0.56* 0.43*  CALCIUM  --  8.6*   Liver Function Tests: No results for input(s): AST, ALT, ALKPHOS, BILITOT, PROT, ALBUMIN in the last 168 hours. No results for input(s): LIPASE, AMYLASE in the last 168 hours. No results for input(s): AMMONIA in the last 168 hours. CBC:  Recent Labs Lab 10/22/15 0449 10/24/15 1222 10/26/15 0715 10/27/15 1015 10/28/15 0423  WBC 13.0* 13.3* 11.6* 14.6* 12.7*  HGB 9.0* 9.7* 9.5* 10.1* 9.9*  HCT 29.3* 31.4* 30.5* 32.3* 31.4*  MCV 85.7 88.2 88.9 87.8 88.7  PLT 476* 437* 367 349 290   Cardiac Enzymes: No results for input(s): CKTOTAL, CKMB, CKMBINDEX, TROPONINI in the last 168 hours. BNP (last 3 results)  Recent Labs  07/28/15 0500 08/02/15 0620 08/16/15 0701  BNP 453.8* 367.7* 528.3*    ProBNP (last 3 results) No results for input(s): PROBNP in the last 8760 hours.  CBG: No results for input(s): GLUCAP in the last 168 hours.  Recent Results (from the past 240 hour(s))  MRSA PCR Screening     Status: None   Collection Time: 10/18/15  8:32 PM  Result Value Ref Range Status   MRSA by PCR NEGATIVE NEGATIVE Final    Comment:        The GeneXpert MRSA Assay (FDA approved for NASAL specimens only), is one component of a comprehensive MRSA colonization surveillance program. It is  not intended to diagnose MRSA infection nor to guide or monitor treatment for MRSA infections.      Studies: Dg Colon W/water Sol Cm  10/26/2015  CLINICAL DATA:  Intra-abdominal abscess. Evaluate for rectal stump leak. EXAM: WATER SOLUBLE CONTRAST ENEMA FLUOROSCOPY TIME:  Radiation Exposure Index (as provided by the fluoroscopic device): 74.6 mGy If the device does not provide the exposure index: Fluoroscopy Time:  0 minutes 57 seconds. Number of Acquired Images:  12 COMPARISON:  CT abdomen pelvis 10/25/2015. FINDINGS: Scout view of the abdomen shows a percutaneous pigtail catheter terminating in the low central abdomen. Stomach is distended with air. Mild gaseous distention of small bowel. After digital rectal examination, enema tip was inserted into the rectum. Small amount of water-soluble contrast was allowed to infuse under gravity. Rectal stump opacifies. A thin trail of contrast extends from the stump, without opacification of additional bowel or leakage of contrast. Delayed images suggest rectal prolapse. IMPRESSION: 1. Opacification of the rectal stump with a thin trail of contrast extending from the stump. No definitive evidence of a fistulous connection to small bowel. No leak. 2. Suspect rectal prolapse. Electronically Signed   By: Leanna Battles M.D.   On: 10/26/2015 16:07    Scheduled Meds: . antiseptic oral rinse  15 mL Mouth Rinse TID PC  . cefTRIAXone (ROCEPHIN)  IV  2 g Intravenous Q24H  . furosemide  40 mg Oral Daily  . magnesium oxide  800 mg Oral BID  . metronidazole  500 mg Intravenous Q8H  . multivitamin with minerals  1 tablet Oral Daily  . nutrition supplement (JUVEN)  1 packet Oral BID BM  . pantoprazole  40 mg Oral Daily  . potassium chloride  30 mEq Oral BID  . [START ON 10/29/2015] predniSONE  30 mg Oral Q breakfast  . protein supplement  2 oz Oral QID  . vancomycin  750 mg Intravenous Q12H  . warfarin  2.5 mg Oral ONCE-1800  . Warfarin - Pharmacist Dosing  Inpatient   Does not apply q1800   Continuous Infusions:   Principal Problem:  Abdominal abscess (HCC) Active Problems:   OSA on CPAP   CAD, multiple vessel   Abdominal pain, generalized   Sacral decubitus ulcer   Chronic systolic CHF (congestive heart failure) (HCC)   Sepsis (HCC)   Warfarin-induced coagulopathy (HCC)   Anemia of chronic disease   Hyponatremia   Hypotension   Acute pulmonary edema (HCC)   Congestive heart failure (HCC)   Hypoxemia   Arterial hypotension   Severe sepsis (HCC)   Shamieka Gullo  [facility] Pager [phone]. If 7PM-7AM, please contact night-coverage at [email or web address], password [facility] 10/28/2015, 12:38 PM  LOS: 12 days

## 2015-10-28 NOTE — Progress Notes (Signed)
ANTICOAGULATION CONSULT NOTE - Follow Up  Pharmacy Consult for Warfarin Indication: DVT  Allergies  Allergen Reactions  . Unasyn [Ampicillin-Sulbactam Sodium] Hives, Itching and Rash    Diffuse rash not responding to Pepcid and Benadryl.  TDD.  Marland Kitchen. Levaquin [Levofloxacin] Nausea Only  . Penicillins Hives  . Doxycycline Rash   Patient Measurements: Height: 5\' 9"  (175.3 cm) Weight: 252 lb 3.2 oz (114.397 kg) IBW/kg (Calculated) : 70.7  Vital Signs: Temp: 97.6 F (36.4 C) (12/14 0521) Temp Source: Axillary (12/14 0521) BP: 126/73 mmHg (12/14 0521) Pulse Rate: 71 (12/14 0521)  Labs:  Recent Labs  10/26/15 0715 10/27/15 0419 10/27/15 1015 10/28/15 0423  HGB 9.5*  --  10.1* 9.9*  HCT 30.5*  --  32.3* 31.4*  PLT 367  --  349 290  LABPROT 27.2* 27.1*  --  29.1*  INR 2.57* 2.56*  --  2.81*  CREATININE  --   --   --  0.43*   Estimated Creatinine Clearance: 107.2 mL/min (by C-G formula based on Cr of 0.43).  Medications:  Scheduled:  . antiseptic oral rinse  15 mL Mouth Rinse TID PC  . cefTRIAXone (ROCEPHIN)  IV  2 g Intravenous Q24H  . furosemide  40 mg Oral Daily  . magnesium oxide  800 mg Oral BID  . metronidazole  500 mg Intravenous Q8H  . multivitamin with minerals  1 tablet Oral Daily  . nutrition supplement (JUVEN)  1 packet Oral BID BM  . pantoprazole  40 mg Oral Daily  . potassium chloride  30 mEq Oral BID  . [START ON 10/29/2015] predniSONE  30 mg Oral Q breakfast  . predniSONE  40 mg Oral Q breakfast  . protein supplement  2 oz Oral QID  . vancomycin  750 mg Intravenous Q12H  . Warfarin - Pharmacist Dosing Inpatient   Does not apply q1800   Assessment: Patient known to pharmacy for dosing of antibiotics. Patient was on warfarin prior to admission at a dose of 5mg  q6pm for hx DVT was on hold at admission, now to restart warfarin with pharmacy dosing assisstance.  Today, 10/28/2015  INR therapeutic - warfarin resumed 12/6  Hgb low but stable, Plt stable  (12/12)  No reported bleeding, good po intake  Drug interactions: Flagyl: can increase INR  Goal of Therapy:  INR 2-3   Plan:   Warfarin 2.5mg  x 1 today at 1800   Requiring lower dose vs. Prior to admission   Daily PT/INR  Juliette Alcideustin Cleveland Yarbro, PharmD, BCPS.   Pager: [phone] 10/28/2015 8:14 AM

## 2015-10-29 ENCOUNTER — Other Ambulatory Visit: Payer: Self-pay | Admitting: Radiology

## 2015-10-29 ENCOUNTER — Encounter (HOSPITAL_BASED_OUTPATIENT_CLINIC_OR_DEPARTMENT_OTHER): Payer: Medicare Other

## 2015-10-29 DIAGNOSIS — T814XXD Infection following a procedure, subsequent encounter: Principal | ICD-10-CM

## 2015-10-29 DIAGNOSIS — IMO0001 Reserved for inherently not codable concepts without codable children: Secondary | ICD-10-CM

## 2015-10-29 DIAGNOSIS — K651 Peritoneal abscess: Principal | ICD-10-CM

## 2015-10-29 LAB — BASIC METABOLIC PANEL
ANION GAP: 6 (ref 5–15)
BUN: 27 mg/dL — ABNORMAL HIGH (ref 6–20)
CALCIUM: 8.8 mg/dL — AB (ref 8.9–10.3)
CHLORIDE: 109 mmol/L (ref 101–111)
CO2: 28 mmol/L (ref 22–32)
CREATININE: 0.33 mg/dL — AB (ref 0.61–1.24)
GFR calc non Af Amer: >60 mL/min (ref >60)
Glucose, Bld: 118 mg/dL — ABNORMAL HIGH (ref 65–99)
Potassium: 3.4 mmol/L — ABNORMAL LOW (ref 3.5–5.1)
SODIUM: 143 mmol/L (ref 135–145)

## 2015-10-29 LAB — CBC
HEMATOCRIT: 33 % — AB (ref 39.0–52.0)
HEMOGLOBIN: 10.4 g/dL — AB (ref 13.0–17.0)
MCH: 28.3 pg (ref 26.0–34.0)
MCHC: 31.5 g/dL (ref 30.0–36.0)
MCV: 89.9 fL (ref 78.0–100.0)
Platelets: 313 10*3/uL (ref 150–400)
RBC: 3.67 MIL/uL — ABNORMAL LOW (ref 4.22–5.81)
RDW: 20.3 % — ABNORMAL HIGH (ref 11.5–15.5)
WBC: 12.4 10*3/uL — AB (ref 4.0–10.5)

## 2015-10-29 LAB — PROTIME-INR
INR: 2.68 — AB (ref 0.00–1.49)
PROTHROMBIN TIME: 28.1 s — AB (ref 11.6–15.2)

## 2015-10-29 MED ORDER — CEPHALEXIN 500 MG PO CAPS
500.0000 mg | ORAL_CAPSULE | Freq: Four times a day (QID) | ORAL | Status: DC
Start: 2015-10-29 — End: 2015-11-20

## 2015-10-29 MED ORDER — WARFARIN SODIUM 2.5 MG PO TABS
2.5000 mg | ORAL_TABLET | Freq: Once | ORAL | Status: AC
  Administered 2015-10-29: 2.5 mg via ORAL
  Filled 2015-10-29: qty 1

## 2015-10-29 MED ORDER — METRONIDAZOLE 500 MG PO TABS
500.0000 mg | ORAL_TABLET | Freq: Three times a day (TID) | ORAL | Status: DC
Start: 2015-10-29 — End: 2015-10-30
  Administered 2015-10-29 – 2015-10-30 (×3): 500 mg via ORAL
  Filled 2015-10-29 (×3): qty 1

## 2015-10-29 MED ORDER — PREDNISONE 20 MG PO TABS: 20.0000 mg | ORAL_TABLET | Freq: Every day | ORAL | Status: DC

## 2015-10-29 MED ORDER — PREDNISONE 20 MG PO TABS
20.0000 mg | ORAL_TABLET | Freq: Every day | ORAL | Status: DC
  Administered 2015-10-30: 20 mg via ORAL
  Filled 2015-10-29: qty 1

## 2015-10-29 MED ORDER — CEPHALEXIN 500 MG PO CAPS
500.0000 mg | ORAL_CAPSULE | Freq: Four times a day (QID) | ORAL | Status: DC
  Administered 2015-10-29 – 2015-10-30 (×4): 500 mg via ORAL
  Filled 2015-10-29 (×4): qty 1

## 2015-10-29 MED ORDER — METRONIDAZOLE 500 MG PO TABS: 500.0000 mg | ORAL_TABLET | Freq: Three times a day (TID) | ORAL | Status: DC

## 2015-10-29 NOTE — Discharge Summary (Addendum)
Physician Discharge Summary  Jonathan Le JXB:[id number] DOB: Dec 07, 1945 DOA: 10/16/2015  PCP: Elizabeth Palau, FNP  Admit date: 10/16/2015 Discharge date: 10/30/2015  Time spent: 45 minutes  Recommendations for Outpatient Follow-up:  1. CCS Dr.Wakefield, office will call for FU 2. Gastrograffin enema and Drain study upon FU per CCS 3. Routine Drain care 4. INR in 1 week   Discharge Diagnoses:  Principal Problem:   Abdominal abscess (HCC) Active Problems:   OSA on CPAP   CAD, multiple vessel   Abdominal pain, generalized   Sacral decubitus ulcer   Chronic systolic CHF (congestive heart failure) (HCC)   Sepsis (HCC)   Warfarin-induced coagulopathy (HCC)   Anemia of chronic disease   Hyponatremia   Hypotension   Acute pulmonary edema (HCC)   Congestive heart failure (HCC)   Hypoxemia   Arterial hypotension   Severe sepsis (HCC)   Malnutrition, moderate  Discharge Condition: improved  Diet recommendation: Heart Healthy  Filed Weights   10/27/15 0358 10/28/15 0521 10/29/15 0637  Weight: 113.7 kg (250 lb 10.6 oz) 114.397 kg (252 lb 3.2 oz) 114.2 kg (251 lb 12.3 oz)    History of present illness:  Chief Complaint: Fever Chills HPI: Jonathan Le is a 69 y.o. male with a history of CAD S/P CABG, CHF, HTN, Hx DVT on Coumadin, OSA, and Glaucoma with a complex recent surgical history who was sent from the [facility] due to fevers and chills for the last 2 weeks despite antibiotics for a UTI, and had low blood pressures for the past 2 days. He was sent for an outpatient CT scan which revealed a large abscess 16 x 10 x 21 cm and he was sent to the ED for admission and treatment.   Hospital Course:  Recurrent Intra-abdominal abscess with sepsis/septic shock -Sepsis resolved. -CT drainage of the abdominal abscess by IR on 12/3.Marland Kitchen Continuous significant output from the drain. Cultures growing Escherichia coli which is pansensitive. Initially treated with broad  spectrum Iv Abx, then changed to Vancomycin with ceftriaxone and Flagyl per ID - ID following. Patient was treated with Unasyn followed by Augmentin for enterococcus on the abscess culture upon discharge in October 2016 -The source of abscess is likely rectal stump leak, per CCS continue drain and FU with CCS for Drain study and gastriograffin enema in 6 weeks. -per ID, could Dc Home on keflex 500 mg 4 times a day and flagyl 500 mg 3 times a day for another 2 weeks -Was on Stress dose steroids in ICU, now being weaned, Prednisone taper for few more days  CAD status post CABG with cardiomyopathy Patient had NSTEMI in July 2016 with cardiogenic shock . Three-vessel disease seen on cardiac cath with EF of 30-35% He underwent CABG.  -now stable, slightly volume overloaded on low-dose PO Lasix.  Stage IV sacral decubitus ulcer Wound care consult appreciated. As per surgery who spoke with plastic surgeon Dr. Kelly Splinter he may qualify to get a surgical flap for the decubitus ulcer once nutritional status is satisfactory. This will have to be done at [facility]. Prealbumin up significantly likely lab error will repeat, Fu with Dr.Sanger, continue wound care for now. 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 . H&H stable. resumed coumadin. -INR therapeutic now  OSA Continue nighttime CPAP  Protein calorie malnutrition moderate Nutritionist consulted. Switched nutritional supplements   Consultations:  CCS  ID  Discharge  Exam: Filed Vitals:   10/28/15 1957 10/29/15 0637  BP: 135/68 133/73  Pulse: 92 70  Temp: 97.8 F (36.6 C) 98.2 F (36.8 C)  Resp: 20 16    General: AAOx3 Cardiovascular: S1S2/RRR Respiratory: CTAB  Discharge Instructions   Discharge Instructions    Diet - low sodium heart healthy    Complete by:  As directed      Increase activity slowly    Complete by:   As directed           Current Discharge Medication List    START taking these medications   Details  cephALEXin (KEFLEX) 500 MG capsule Take 1 capsule (500 mg total) by mouth every 6 (six) hours. For 2 weeks Qty: 56 capsule, Refills: 0    metroNIDAZOLE (FLAGYL) 500 MG tablet Take 1 tablet (500 mg total) by mouth every 8 (eight) hours. Qty: 42 tablet, Refills: 0    predniSONE (DELTASONE) 20 MG tablet Take 1 tablet (20 mg total) by mouth daily with breakfast. For 1 day then 10mg  for 2days then STOP Qty: 2 tablet, Refills: 0      CONTINUE these medications which have NOT CHANGED   Details  acetaminophen (TYLENOL) 325 MG tablet Take 2 tablets (650 mg total) by mouth 2 (two) times daily with breakfast and lunch.    Amino Acids-Protein Hydrolys (FEEDING SUPPLEMENT, PRO-STAT SUGAR FREE 64,) LIQD Take 30 mLs by mouth 3 (three) times daily. Qty: 900 mL, Refills: 0    antiseptic oral rinse (BIOTENE) LIQD 15 mLs by Mouth Rinse route 3 (three) times daily after meals.    diclofenac sodium (VOLTAREN) 1 % GEL Apply 2 g topically 4 (four) times daily.    famotidine (PEPCID) 20 MG tablet Take 1 tablet (20 mg total) by mouth 2 (two) times daily.    finasteride (PROSCAR) 5 MG tablet Take 1 tablet (5 mg total) by mouth daily.    furosemide (LASIX) 40 MG tablet Take 1 tablet (40 mg total) by mouth daily. Qty: 30 tablet    HYDROcodone-acetaminophen (NORCO/VICODIN) 5-325 MG tablet Take 1 tablet by mouth every 6 (six) hours as needed for moderate pain.    magnesium oxide (MAG-OX) 400 (241.3 MG) MG tablet Take 2 tablets (800 mg total) by mouth 2 (two) times daily.    Multiple Vitamins-Minerals (DECUBI-VITE PO) Take 1 tablet by mouth daily.    ondansetron (ZOFRAN) 4 MG tablet Take 1 tablet (4 mg total) by mouth every 6 (six) hours as needed for nausea. Qty: 20 tablet, Refills: 0    pantoprazole (PROTONIX) 40 MG tablet Take 40 mg by mouth daily.    potassium chloride (K-DUR,KLOR-CON) 10 MEQ  tablet Take 30 mEq by mouth 2 (two) times daily.    tamsulosin (FLOMAX) 0.4 MG CAPS capsule Take 1 capsule (0.4 mg total) by mouth daily after supper. Qty: 30 capsule    warfarin (COUMADIN) 5 MG tablet Take 1 tablet (5 mg total) by mouth daily at 6 PM.      STOP taking these medications     Menthol-Methyl Salicylate (MUSCLE RUB) 10-15 % CREA      PRESCRIPTION MEDICATION        Allergies  Allergen Reactions  . Unasyn [Ampicillin-Sulbactam Sodium] Hives, Itching and Rash    Diffuse rash not responding to Pepcid and Benadryl.  TDD.  Marland Kitchen Levaquin [Levofloxacin] Nausea Only  . Penicillins Hives  . Doxycycline Rash   Follow-up Information    Follow up with Elizabeth Palau, FNP. Schedule an appointment as  soon as possible for a visit in 1 week.   Specialty:  Nurse Practitioner   Contact information:   [address] [name] [address] [phone]        The results of significant diagnostics from this hospitalization (including imaging, microbiology, ancillary and laboratory) are listed below for reference.    Significant Diagnostic Studies: Ct Abdomen Pelvis W Contrast  10/25/2015  CLINICAL DATA:  69 year old with subtotal colectomy and ileostomy due to ischemic colitis 06/18/2015, subsequent hospitalization in October for abdominal abscess drainage and possible stump breakdown, percutaneous drainage of a large intra-abdominal abscess 10/17/2015, now with persistent leukocytosis. Patient also has a sacral decubitus ulcer. EXAM: CT ABDOMEN AND PELVIS WITH CONTRAST TECHNIQUE: Multidetector CT imaging of the abdomen and pelvis was performed using the standard protocol following bolus administration of intravenous contrast. CONTRAST:  OMNIPAQUE IOHEXOL 300 MG/ML IV. Oral contrast was also administered. COMPARISON:  Multiple prior CT abdomen and pelvis examinations dating back to 06/18/2015, most recently 10/17/2015 at the time of drainage catheter placement.  FINDINGS: Lower chest: Bilateral pleural effusions, right greater than left, with associated passive atelectasis in the lower lobes, increased since the 10/16/2015 examination. Calcified granuloma again noted in the left lower lobe. Heart enlarged with extensive 3 vessel coronary atherosclerosis. Hepatobiliary: Liver normal in size. Relative enlargement of the left lobe and caudate lobe compared to the right lobe. Gallbladder contracted, accounting for the mucosal enhancement. No calcified gallstones. No biliary ductal dilation. Pancreas: Normal in appearance without evidence of mass, ductal dilation, or inflammation. Spleen: Normal in size and appearance. Adrenals/Urinary Tract: Normal appearing adrenal glands. Nonobstructing bilateral renal calculi as noted previously. Kidneys otherwise normal in appearance. Normal-appearing urinary bladder. Stomach/Bowel: Stomach normal in appearance for the degree of distention. Wall thickening and edema involving several loops of what are likely mid and distal jejunum. Small extraluminal gas bubbles adjacent to an abnormal loop in the left upper pelvis. Remainder of the small bowel normal in appearance. Normal-appearing ileostomy in the right periumbilical region. Fluid-filled Hartmann pouch as noted previously. Right paramedian sacral decubitus ulcer. Vascular/Lymphatic: Mild to moderate aortoiliofemoral atherosclerosis without aneurysm. Widely patent visceral arteries. 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 Reproductive: Moderate to marked prostate gland enlargement, unchanged. Other: Edema involving the small bowel mesentery. Previously identified abscess completely drained, with no residual fluid collection. No new abscess. Severe generalized body wall edema. Musculoskeletal: Degenerative disc disease, spondylosis and facet degenerative changes diffusely throughout the lumbar spine with severe multifactorial spinal stenosis at L2-3 and L4-5 and  moderate multifactorial spinal stenosis at L3-4. Bilateral hip joint effusions. IMPRESSION: 1. No evidence of residual abdominal abscess after percutaneous drainage. No new abscess elsewhere. 2. Wall thickening/edema involving a several cm segment of mid and distal jejunum, associated with edema in the small bowel mesentery. Extraluminal gas bubbles in the left upper pelvis are in proximity to one of these abnormal bowel loops and may indicate microperforation. 3. Satisfactory appearance of the ileostomy in the right upper pelvis. 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 5. Bilateral pleural effusions, right greater than left, with associated passive atelectasis in the lower lobes. 6. Anasarca. 7. Moderate to marked prostate gland enlargement. 8. Osseous findings as above, with multilevel multifactorial spinal stenosis. 9. Right paramedian sacral decubitus ulcer with possible osteomyelitis involving the last coccygeal segment. 10. Bilateral hip joint effusions. Electronically Signed   By: Hulan Saas M.D.   On: 10/25/2015 15:50   Ct Abdomen Pelvis W Contrast  10/16/2015  CLINICAL DATA:  Fever and abdominal pain.  Partial colectomy. EXAM: CT ABDOMEN AND PELVIS WITH CONTRAST TECHNIQUE: Multidetector CT imaging of the abdomen and pelvis was performed using the standard protocol following bolus administration of intravenous contrast. CONTRAST:  ISOVUE-300 IOPAMIDOL (ISOVUE-300) INJECTION 61% COMPARISON:  09/18/2015 FINDINGS: Lower chest and abdominal wall: Continued decrease in left abdominal wall fluid collections, with no residual drainable collection. Percutaneous drains have been removed from the site. 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 A chronic and thick walled left pleural effusion continues to decrease in volume. Underlying left lower lobe scarring is again demonstrated and stable.  Extensive atherosclerosis, including the coronary arteries. Hepatobiliary: No focal liver abnormality.No evidence of biliary obstruction or stone. Pancreas: Unremarkable. Spleen: Splenorenal shunt as previously seen. No acute portal venous obstruction. Adrenals/Urinary Tract: Negative adrenals. Bilateral small nonobstructing stones, up to 5 mm. No hydronephrosis or ureteral calculus. Chronic mild bladder wall thickening. No surrounding inflammatory change. Reproductive:Marked enlargement of the prostate, symmetric and stable. Stomach/Bowel: Status post colectomy with stable fluid filling of the Hartman's pouch. The wall of the colonic remnant remains thick, but there is no evidence of surrounding inflammation. There is a large gas and fluid collection with a well-defined rim enhancement in the central abdomen measuring 16 x 10 x 21 cm, consistent with abscess. This is likely in the interloop peritoneum. There are small daughter cystic areas which appear contiguous. There is also tracking into the anterior abdominal wall with potential loculated areas. Oral contrast reaches a right lower quadrant ileostomy and does not opacify the collection to suggest current bowel leak. No fluid collection seen in this area previously. Vascular/Lymphatic: No acute vascular abnormality. Splenorenal shunt without current acute obstruction. Musculoskeletal: Advanced degenerative disc and facet disease disk with marked sclerosis and disc narrowing at L2-3 and L3-4. Chronic L4-5 anterolisthesis. These results were called by telephone at the time of interpretation on 10/16/2015 at 3:17 pm to [facility] Erskine Squibb, who verbally acknowledged these results. IMPRESSION: 1. 16 x 10 x 21 cm central abdominal abscess tracking into the ventral abdominal wall. Oral contrast reaches the patient's ileostomy and does not opacify the abscess to suggest bowel leak/fistula. 2. Left abdominal wall collections seen on comparison study 09/18/2015 have resolved. 3.  Chronic complex left pleural effusion is small and decreased. 4. Stable appearance of sacral decubitus ulcer extending to bone. 5. Splenorenal varix. Electronically Signed   By: Marnee Spring M.D.   On: 10/16/2015 15:30   Ir Sinus/fist Tube Chk-non Gi  10/26/2015  CLINICAL DATA:  History of partial colectomy for ischemic colitis complicated by development of multiple postoperative abdominal fluid collections / abscesses and several ultrasound and fluoroscopic guided percutaneous drainage catheter placements. Patient recently underwent a CT-guided abdominal fluid collection percutaneous drainage catheter placement on 10/17/2015. Subsequent CT scan performed 10/25/2015 demonstrates complete resolution of previously drained intra-abdominal abscess. As such, patient presents today for fluoroscopic guided percutaneous drainage catheter injection. EXAM: SINUS TRACT INJECTION/FISTULOGRAM COMPARISON:  CT abdomen pelvis -10/25/2015; 08/10/2015; 07/29/2015; 07/21/2015; CT-guided abdominal abscess drainage catheter placement- 10/17/2015; 07/21/2015; ultrasound-guided left abdominal wall percutaneous drainage catheter placement - 08/12/2015 CONTRAST:  15 cc Omnipaque 300 FLUOROSCOPY TIME:  1 minute, 12 seconds (65 mGy) TECHNIQUE: The patient was positioned supine on the fluoroscopy table. A preprocedural spot fluoroscopic image was obtained of the midline of the abdomen existing solitary percutaneous drainage catheter. Multiple spot fluoroscopic and radiographic images were obtained in various obliquities following the injection of  a small amount of contrast. Images were reviewed and the drainage catheter was flushed with a small amount of saline and reconnected to a JP bulb. A dressing was placed. The patient tolerated the procedure well without immediate postprocedural complication. FINDINGS: Preprocedural spot fluoroscopic image demonstrates unchanged positioning of the solitary existing percutaneous drainage catheter  with end coiled and locked over the midline of the abdomen. Contrast injection demonstrates opacification of the cheek of breast abscess cavity but was negative for definitive communication with any adjacent loop of large or small intestine. IMPRESSION: Contrast injection of the existing percutaneous drainage catheter is negative for the presence of a fistulous connection to an adjacent loop of large or small bowel. PLAN: Above findings were discussed with referring surgical p.a., Barnetta ChapelKelly Osborne. CCS wants the patient to undergo a contrast-enhanced enema to further evaluate for anastomotic leak prior to removal of the percutaneous drainage catheter. If the contrast-enhanced enema is also negative for fistulous connection to the residual percutaneous drainage catheter, the drainage catheter may be removed at the patient's bedside as per the recommendations of CCS. Electronically Signed   By: Simonne ComeJohn  Watts M.D.   On: 10/26/2015 13:46   Ct Image Guided Drainage By Percutaneous Catheter  10/18/2015  CLINICAL DATA:  History of colectomy, ileostomy and previous drainage of multiple peritoneal abscesses. Development of large dominant intraperitoneal abscess requiring drainage. EXAM: CT GUIDED DRAINAGE OF PERITONEAL ABSCESS COMPARISON:  CT of the abdomen and pelvis on 10/16/2015 ANESTHESIA/SEDATION: 1.0 Mg IV Versed 50 mcg IV Fentanyl Total Moderate Sedation Time:  32 minutes. PROCEDURE: The procedure, risks, benefits, and alternatives were explained to the patient. Questions regarding the procedure were encouraged and answered. The patient understands and consents to the procedure. A time-out was performed prior to initiating the procedure. The anterior abdominal wall was prepped with Betadine in a sterile fashion, and a sterile drape was applied covering the operative field. A sterile gown and sterile gloves were used for the procedure. Local anesthesia was provided with 1% Lidocaine. CT was performed of the abdomen and  upper pelvis in a supine position. After choosing a site along the left anterior abdominal wall for drainage, an 18 gauge trocar needle was advanced into a peritoneal abscess collection. Aspiration of fluid was performed and a fluid sample sent for culture analysis. A guidewire was advanced into the collection. After tract dilatation, a 12 French percutaneous drain was placed. Drainage catheter position was confirmed by CT. The catheter was flushed and connected to a suction bulb. It was secured at the skin with a Prolene retention suture and StatLock device. COMPLICATIONS: None FINDINGS: Large peritoneal abscess was localized. Puncture of the collection was performed just to the left of midline. Aspiration yielded grossly purulent fluid. After drainage catheter placement, there is copious return of purulent fluid. IMPRESSION: CT-guided percutaneous catheter drainage of large intraperitoneal abscess. A 12 French drain was placed and attached to suction bulb drainage. A purulent fluid sample was sent for culture analysis. Electronically Signed   By: Irish LackGlenn  Yamagata M.D.   On: 10/18/2015 09:13   Dg Colon W/water Sol Cm  10/26/2015  CLINICAL DATA:  Intra-abdominal abscess. Evaluate for rectal stump leak. EXAM: WATER SOLUBLE CONTRAST ENEMA FLUOROSCOPY TIME:  Radiation Exposure Index (as provided by the fluoroscopic device): 74.6 mGy If the device does not provide the exposure index: Fluoroscopy Time:  0 minutes 57 seconds. Number of Acquired Images:  12 COMPARISON:  CT abdomen pelvis 10/25/2015. FINDINGS: Scout view of the abdomen shows a percutaneous pigtail  catheter terminating in the low central abdomen. Stomach is distended with air. Mild gaseous distention of small bowel. After digital rectal examination, enema tip was inserted into the rectum. Small amount of water-soluble contrast was allowed to infuse under gravity. Rectal stump opacifies. A thin trail of contrast extends from the stump, without  opacification of additional bowel or leakage of contrast. Delayed images suggest rectal prolapse. IMPRESSION: 1. Opacification of the rectal stump with a thin trail of contrast extending from the stump. No definitive evidence of a fistulous connection to small bowel. No leak. 2. Suspect rectal prolapse. Electronically Signed   By: Leanna Battles M.D.   On: 10/26/2015 16:07    Microbiology: No results found for this or any previous visit (from the past 240 hour(s)).   Labs: Basic Metabolic Panel:  Recent Labs Lab 10/23/15 0510 10/28/15 0423 10/29/15 0424  NA  --  137 143  K  --  3.2* 3.4*  CL  --  109 109  CO2  --  24 28  GLUCOSE  --  137* 118*  BUN  --  27* 27*  CREATININE 0.56* 0.43* 0.33*  CALCIUM  --  8.6* 8.8*   Liver Function Tests: No results for input(s): AST, ALT, ALKPHOS, BILITOT, PROT, ALBUMIN in the last 168 hours. No results for input(s): LIPASE, AMYLASE in the last 168 hours. No results for input(s): AMMONIA in the last 168 hours. CBC:  Recent Labs Lab 10/24/15 1222 10/26/15 0715 10/27/15 1015 10/28/15 0423 10/29/15 0424  WBC 13.3* 11.6* 14.6* 12.7* 12.4*  HGB 9.7* 9.5* 10.1* 9.9* 10.4*  HCT 31.4* 30.5* 32.3* 31.4* 33.0*  MCV 88.2 88.9 87.8 88.7 89.9  PLT 437* 367 349 290 313   Cardiac Enzymes: No results for input(s): CKTOTAL, CKMB, CKMBINDEX, TROPONINI in the last 168 hours. BNP: BNP (last 3 results)  Recent Labs  07/28/15 0500 08/02/15 0620 08/16/15 0701  BNP 453.8* 367.7* 528.3*    ProBNP (last 3 results) No results for input(s): PROBNP in the last 8760 hours.  CBG: No results for input(s): GLUCAP in the last 168 hours.     SignedZannie Cove  [facility] 10/29/2015, 1:27 PM

## 2015-10-29 NOTE — Progress Notes (Signed)
Pt's wife called and asked for an air mattress and to speak with a doctor. Will ask MD to call wife at [phone].

## 2015-10-29 NOTE — Progress Notes (Signed)
This CM joined the CSW who was aready in pt's room to listen to conversation to see if [facility] was [facility] or returning to [facility] or a different SNF. Pt states, " I will go to SNF."  Spoke with pt's wife via phone concerning discharge plans.  Pt's wife was upset, asking for doctors to call her and stating that she was recording this call.   Mrs. Lowell Guitarowell then stated if he comes home we will need Hospital bed, WC, HHRN/PT. Pt then stated that he would go to SNF. Mrs. Lowell Guitarowell stated, "whatever he wants to do."

## 2015-10-29 NOTE — Progress Notes (Signed)
    [facility]   Reason for visit: Follow up on intra-abdominal abscess  Interval History:  No changes.  No fever, no chills.   Physical Exam: Constitutional:  Filed Vitals:   10/29/15 0637 10/29/15 1328  BP: 133/73 117/60  Pulse: 70 108  Temp: 98.2 F (36.8 C) 97.5 F (36.4 C)  Resp: 16 18   patient appears in NAD Respiratory: Normal respiratory effort; CTA B Cardiovascular: RRR  Review of Systems: Constitutional: negative for chills and malaise Gastrointestinal: negative for nausea and diarrhea  Lab Results  Component Value Date   WBC 12.4* 10/29/2015   HGB 10.4* 10/29/2015   HCT 33.0* 10/29/2015   MCV 89.9 10/29/2015   PLT 313 10/29/2015    Lab Results  Component Value Date   CREATININE 0.33* 10/29/2015   BUN 27* 10/29/2015   NA 143 10/29/2015   K 3.4* 10/29/2015   CL 109 10/29/2015   CO2 28 10/29/2015    Lab Results  Component Value Date   ALT 14* 10/20/2015   AST 13* 10/20/2015   ALKPHOS 102 10/20/2015     Microbiology: No results found for this or any previous visit (from the past 240 hour(s)).  Impression/Plan:  1. Intra-abdominal abscess - on ceftriaxone and flagyl along with vancomycin.  Likely mixed. Vancomycin due to gram stain with GPC in chains, concern for continued Enterococcus though never did grow, despite significant amount of organism on gram stain.  With the leak and continued drain, I will have him continue with antibiotics and he can go out on keflex 500 mg 4 times a day and flagyl 500 mg 3 times a day for another 2 weeks.  If the drain remains in place, this may need to be extended. Being discharged today.  Apparently to home since other offers were declined.  We will arrange follow up in our clinic in 2-3 weeks.

## 2015-10-29 NOTE — Discharge Instructions (Signed)
Drain flush -flush with 10cc NS once daily  Wound care -twice daily wet to dry dressing changes, pack adequately

## 2015-10-29 NOTE — Progress Notes (Signed)
CSW has provided patient with SNF offers- he has declined every offer except [facility]-  CSW has explained to patient that he is medically ready today and there is [facility]. Patient discussed above with his wife and per report to me at this time the wife has discussed with my Supervisor and the decision is for dc to home with HH/DME. RNCM advised of plans for dc to home. CSW will provide EMS transport.    Reece LevyJanet Pearson Reasons, MSW, Theresia MajorsLCSWA  [phone]

## 2015-10-29 NOTE — Progress Notes (Signed)
Pt said he made a mistake making a decision to choosing to go home. Him & his wife found out that the hospital  bed that will be delivered to their house is a manual hospital bed & will be difficult for his wife to take care of him with this type of hospital bed. They both agreed it is more beneficial  [facility]. So now, they are hoping [facility]. MD aware.

## 2015-10-29 NOTE — Progress Notes (Signed)
Patient ID: Jonathan Le, male   DOB: September 17, 1946, 69 y.o.   MRN: [id number]     [facility]      [address] [place], [place] [address]    Phone: [phone] FAX: [phone]     Subjective: Tolerating Pos, afebrile.  Ostomy functioning.  61m serous drainage out. Had lots of questions re rehab/SNF/going home. Wants a letter of recommendation for inpatient rehab, told him it doesn't work that way and we have no say in this.  PT rec SNF.  I talked to the patient about 430ms.   Objective:  Vital signs:  Filed Vitals:   10/28/15 0521 10/28/15 1440 10/28/15 1957 10/29/15 0637  BP: 126/73 134/74 135/68 133/73  Pulse: 71 86 92 70  Temp: 97.6 F (36.4 C) 97.5 F (36.4 C) 97.8 F (36.6 C) 98.2 F (36.8 C)  TempSrc: Axillary Oral Oral Axillary  Resp: _0 Height:      Weight: 114.397 kg (252 lb 3.2 oz)   114.2 kg (251 lb 12.3 oz)  SpO2: 96% 96% 95% 99%    Last BM Date: 10/28/15  Intake/Output   Yesterday:  12/14 0701 - 12/15 0700 In: 680 [IV Piggyback:650] Out: 1890 [U[AVWPV:9480Drains:65; Stool:650] This shift:    Physical Exam: General: Pt awake/alert/oriented x4 in no acute distress Abdomen: Soft. Nondistended. Non tender. rlq ostomy functioning. Drain with serous output. No evidence of peritonitis. No incarcerated hernias.  Skin: large stage IV sacral decub, clean, repacked.    Problem List:   Principal Problem:   Abdominal abscess ([facility]unction[facility] Problems:   OSA on CPAP   CAD, multiple vessel   Abdominal pain, generalized   Sacral decubitus ulcer   Chronic systolic CHF (congestive heart failure) (HCC)   Sepsis (HCC)   Warfarin-induced coagulopathy (HCC)   Anemia of chronic disease   Hyponatremia   Hypotension   Acute pulmonary edema (HCC)   Congestive heart failure (HCC)   Hypoxemia   Arterial hypotension   Severe sepsis ([facility]   Results:   Labs: Results for orders placed or  performed during the hospital encounter of 10/16/15 (from the past 48 hour(s))  Prealbumin     Status: None   Collection Time: 10/27/15 10:15 AM  Result Value Ref Range   Prealbumin 24.7 18 - 38 mg/dL    Comment[facility]     Status: Abnormal   Collection Time: 10/27/15 10:15 AM  Result Value Ref Range   WBC 14.6 (H) 4.0 - 10.5 K/uL   RBC 3.68 (L) 4.22 - 5.81 MIL/uL   Hemoglobin 10.1 (L) 13.0 - 17.0 g/dL   HCT 32.3 (L) 39.0 - 52.0 %   MCV 87.8 78.0 - 100.0 fL   MCH 27.4 26.0 - 34.0 pg   MCHC 31.3 30.0 - 36.0 g/dL   RDW 18.9 (H) 11.5 - 15.5 %   Platelets 349 150 - 400 K/uL  Protime-INR     Status: Abnormal   Collection Time: 10/28/15  4:23 AM  Result Value Ref Range   Prothrombin Time 29.1 (H) 11.6 - 15.2 seconds   INR 2.81 (H) 0.00 - 1.1.65Basic metabolic panel     Status: Abnormal   Collection Time: 10/28/15  4:23 AM  Result Value Ref Range   Sodium 137 135 - 145 mmol/L   Potassium 3.2 (L) 3.5 - 5.1 mmol/L   Chloride 109 101 - 111 mmol/L  CO2 24 22 - 32 mmol/L   Glucose, Bld 137 (H) 65 - 99 mg/dL   BUN 27 (H) 6 - 20 mg/dL   Creatinine, Ser 0.43 (L) 0.61 - 1.24 mg/dL   Calcium 8.6 (L) 8.9 - 10.3 mg/dL   GFR calc non Af Amer >60 >60 mL/min   GFR calc Af Amer >60 >60 mL/min    Comment: (NOTE) The eGFR has been calculated using the CKD EPI equation. This calculation has not been validated in all clinical situations. eGFR's persistently <60 mL/min signify possible Chronic Kidney Disease.    Anion gap 4 (L) 5 - 15  CBC     Status: Abnormal   Collection Time: 10/28/15  4:23 AM  Result Value Ref Range   WBC 12.7 (H) 4.0 - 10.5 K/uL   RBC 3.54 (L) 4.22 - 5.81 MIL/uL   Hemoglobin 9.9 (L) 13.0 - 17.0 g/dL   HCT 31.4 (L) 39.0 - 52.0 %   MCV 88.7 78.0 - 100.0 fL   MCH 28.0 26.0 - 34.0 pg   MCHC 31.5 30.0 - 36.0 g/dL   RDW 19.3 (H) 11.5 - 15.5 %   Platelets 290 150 - 400 K/uL  Prealbumin     Status: None   Collection Time: 10/28/15  9:50 AM   Result Value Ref Range   Prealbumin 25.7 18 - 38 mg/dL    Comment: Performed at [place]     Status: Abnormal   Collection Time: 10/29/15  4:24 AM  Result Value Ref Range   Prothrombin Time 28.1 (H) 11.6 - 15.2 seconds   INR 2.68 (H) 0.00 - 1.49  CBC     Status: Abnormal   Collection Time: 10/29/15  4:24 AM  Result Value Ref Range   WBC 12.4 (H) 4.0 - 10.5 K/uL   RBC 3.67 (L) 4.22 - 5.81 MIL/uL   Hemoglobin 10.4 (L) 13.0 - 17.0 g/dL   HCT 33.0 (L) 39.0 - 52.0 %   MCV 89.9 78.0 - 100.0 fL   MCH 28.3 26.0 - 34.0 pg   MCHC 31.5 30.0 - 36.0 g/dL   RDW 20.3 (H) 11.5 - 15.5 %   Platelets 313 150 - 400 K/uL  Basic metabolic panel     Status: Abnormal   Collection Time: 10/29/15  4:24 AM  Result Value Ref Range   Sodium 143 135 - 145 mmol/L   Potassium 3.4 (L) 3.5 - 5.1 mmol/L   Chloride 109 101 - 111 mmol/L   CO2 28 22 - 32 mmol/L   Glucose, Bld 118 (H) 65 - 99 mg/dL   BUN 27 (H) 6 - 20 mg/dL   Creatinine, Ser 0.33 (L) 0.61 - 1.24 mg/dL   Calcium 8.8 (L) 8.9 - 10.3 mg/dL   GFR calc non Af Amer >60 >60 mL/min   GFR calc Af Amer >60 >60 mL/min    Comment: (NOTE) The eGFR has been calculated using the CKD EPI equation. This calculation has not been validated in all clinical situations. eGFR's persistently <60 mL/min signify possible Chronic Kidney Disease.    Anion gap 6 5 - 15    Imaging / Studies: No results found.  Medications / Allergies:  Scheduled Meds: . antiseptic oral rinse  15 mL Mouth Rinse TID PC  . cefTRIAXone (ROCEPHIN)  IV  2 g Intravenous Q24H  . furosemide  40 mg Oral Daily  . magnesium oxide  800 mg Oral BID  . metronidazole  500 mg Intravenous Q8H  .  multivitamin with minerals  1 tablet Oral Daily  . nutrition supplement (JUVEN)  1 packet Oral BID BM  . pantoprazole  40 mg Oral Daily  . potassium chloride  30 mEq Oral BID  . predniSONE  30 mg Oral Q breakfast  . protein supplement  2 oz Oral QID  . vancomycin  750 mg  Intravenous Q12H  . warfarin  2.5 mg Oral ONCE-1800  . Warfarin - Pharmacist Dosing Inpatient   Does not apply q1800   Continuous Infusions:  PRN Meds:.acetaminophen **OR** acetaminophen, alum & mag hydroxide-simeth, HYDROmorphone (DILAUDID) injection, ondansetron **OR** ondansetron (ZOFRAN) IV, oxyCODONE  Antibiotics: Anti-infectives    Start     Dose/Rate Route Frequency Ordered Stop   10/26/15 2000  vancomycin (VANCOCIN) IVPB 750 mg/150 ml premix     750 mg 150 mL/hr over 60 Minutes Intravenous Every 12 hours 10/26/15 0835     10/20/15 2000  vancomycin (VANCOCIN) IVPB 1000 mg/200 mL premix     1,000 mg 200 mL/hr over 60 Minutes Intravenous Every 12 hours 10/20/15 1606 10/26/15 0957   10/20/15 1630  metroNIDAZOLE (FLAGYL) IVPB 500 mg    Comments:  Pharmacy may adjust   500 mg 100 mL/hr over 60 Minutes Intravenous Every 8 hours 10/20/15 1541     10/20/15 1330  cefTRIAXone (ROCEPHIN) 2 g in dextrose 5 % 50 mL IVPB     2 g 100 mL/hr over 30 Minutes Intravenous Every 24 hours 10/20/15 1304     10/18/15 0400  ceFEPIme (MAXIPIME) 2 g in dextrose 5 % 50 mL IVPB  Status:  Discontinued     2 g 100 mL/hr over 30 Minutes Intravenous 3 times per day 10/18/15 0314 10/20/15 1304   10/17/15 1730  cefTRIAXone (ROCEPHIN) 1 g in dextrose 5 % 50 mL IVPB  Status:  Discontinued     1 g 100 mL/hr over 30 Minutes Intravenous Every 24 hours 10/16/15 2146 10/17/15 0008   10/17/15 1000  cefTRIAXone (ROCEPHIN) 2 g in dextrose 5 % 50 mL IVPB  Status:  Discontinued     2 g 100 mL/hr over 30 Minutes Intravenous Every 24 hours 10/17/15 0008 10/18/15 0236   10/17/15 0800  vancomycin (VANCOCIN) IVPB 1000 mg/200 mL premix  Status:  Discontinued     1,000 mg 200 mL/hr over 60 Minutes Intravenous Every 12 hours 10/16/15 1932 10/20/15 1303   10/17/15 0130  metroNIDAZOLE (FLAGYL) IVPB 500 mg  Status:  Discontinued     500 mg 100 mL/hr over 60 Minutes Intravenous Every 8 hours 10/16/15 2147 10/20/15 1304   10/16/15  1730  metroNIDAZOLE (FLAGYL) IVPB 500 mg     500 mg 100 mL/hr over 60 Minutes Intravenous  Once 10/16/15 1721 10/16/15 1923   10/16/15 1730  cefTRIAXone (ROCEPHIN) 1 g in dextrose 5 % 50 mL IVPB     1 g 100 mL/hr over 30 Minutes Intravenous  Once 10/16/15 1721 10/16/15 1902   10/16/15 1730  vancomycin (VANCOCIN) 2,000 mg in sodium chloride 0.9 % 500 mL IVPB     2,000 mg 250 mL/hr over 120 Minutes Intravenous  Once 10/16/15 1727 10/16/15 2222        Assessment/Plan 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 Donne Hazel, closure of abdominal wall, wound vac and ileostomy - 06/20/15, Dr. Barry Dienes; after CABG 05/22/15, Dr. Tharon Aquas Trigt. S/p hospitalized in [facility] and [facility] 10/13-11/11/16 for intraabdominal abscess drainage, stump breakdown. IR drain placement 10/17/15 for new abdominal abscess After  further review, gastrografin enema demonstrates a rectal stump leak. With this, continue with drain. Plan to repeat gastrografin enema in 2-3 weeks. Could take 6-12 weeks to heal, it it will heal. Wean steroids to aid in healing Antibiotics per ID Stage IV sacral decubitus-clean. Continue BID wet to dry dressing changes. Prealbumin 24, repeat 25.7. Per Dr. Marla Roe peralbumin >16 before consideration for flap.  Dispo-Pt rec SNF, not a candidate for CIR.  Now considering DC home with [facility], told him probably not a good idea.  I have consulted care management to discuss with pt.  Stable for DC from a surgical standpoint.  OP f/u with Dr. Donne Hazel.   Erby Pian, [facility] Pager [phone]([id number])  10/29/2015 9:10 AM

## 2015-10-29 NOTE — Progress Notes (Signed)
Pharmacy Antibiotic Note  Jonathan HarpsHarold D Le is a 69 y.o. year-old male admitted on 10/16/2015.  On 12/9, Vancomycin, Cefepime & Metronidazole were d/c'ed and patient placed on Ceftriaxone 2gm IV q24h.  ID consulted who recommended continuing Ceftriaxone and adding back the Metronidazole and Vancomycin.  Vancomycin was continued for GPC in pairs seen on gram stain.  IAI with multiple abdominal abscesses with positive Ecoli culture  Assessment: Intra-abdominal abscess - on ceftriaxone and flagyl along with vancomycin. No changes for now (per ID) Still with drainage and plan per surgery/IR.  Repeat CT = no residual abscess nor new abscess on 12/11 CT, Fistulogram =  no signs of fistula, contrast enema = ? contained rectal stump leak  Vancomycin trough goal of 10-15 mcg/ml acceptable for intra-abdominal infection  Plan: Day #13 antibiotics  Continue vancomycin 750mg  IV q12h - dose adjusted 12/12 for trough trending upward  Scr stable (low)  Recheck weekly troughs, SCr at min q72h while on vancomycin   Continue Ceftriaxone & Metronidazole per MD   Plan per ID  is to change to PO keflex/flagyl x min 2 weeks when leaves for rehab (needs longer course if drains remain in place)  Temp (24hrs), Avg:97.8 F (36.6 C), Min:97.5 F (36.4 C), Max:98.2 F (36.8 C)   Recent Labs Lab 10/24/15 1222 10/26/15 0715 10/27/15 1015 10/28/15 0423 10/29/15 0424  WBC 13.3* 11.6* 14.6* 12.7* 12.4*     Recent Labs Lab 10/23/15 0510 10/28/15 0423 10/29/15 0424  CREATININE 0.56* 0.43* 0.33*   Estimated Creatinine Clearance: 107.1 mL/min (by C-G formula based on Cr of 0.33).   Antimicrobial allergies: Unasyn, PCN and doxy  12/3 >> rocephin >> 12/4 12/3 >> flagyl >>  12/3 >> vancomycin >> 12/4 >> cefepime >>12/6 12/6 >> ceftriaxone >>  Levels/dose changes this admission:  12/6 Vanc trough = 17 mcg/ml - no changes  12/12 Vanc trough = 4719mcg/ml on 1gm q12h, changed to 750mg  q12h  12/2  blood:NGTD 12/2 urine: multiple species-final 12/3 gram stain (peritoneal abscess) : abundant GPC in pairs, moderate GNR 12/3 peritoneal abscess: abundant Ecoli - pansensitive, abundant GPC  Juliette Alcideustin Ava Tangney, PharmD, BCPS.   Pager: [phone] 10/29/2015 7:45 AM

## 2015-10-29 NOTE — Progress Notes (Signed)
ANTICOAGULATION CONSULT NOTE - Follow Up  Pharmacy Consult for Warfarin Indication: DVT  Allergies  Allergen Reactions  . Unasyn [Ampicillin-Sulbactam Sodium] Hives, Itching and Rash    Diffuse rash not responding to Pepcid and Benadryl.  TDD.  Marland Kitchen. Levaquin [Levofloxacin] Nausea Only  . Penicillins Hives  . Doxycycline Rash   Patient Measurements: Height: 5\' 9"  (175.3 cm) Weight: 251 lb 12.3 oz (114.2 kg) IBW/kg (Calculated) : 70.7  Vital Signs: Temp: 98.2 F (36.8 C) (12/15 0637) Temp Source: Axillary (12/15 0637) BP: 133/73 mmHg (12/15 0637) Pulse Rate: 70 (12/15 0637)  Labs:  Recent Labs  10/27/15 0419  10/27/15 1015 10/28/15 0423 10/29/15 0424  HGB  --   < > 10.1* 9.9* 10.4*  HCT  --   --  32.3* 31.4* 33.0*  PLT  --   --  349 290 313  LABPROT 27.1*  --   --  29.1* 28.1*  INR 2.56*  --   --  2.81* 2.68*  CREATININE  --   --   --  0.43* 0.33*  < > = values in this interval not displayed. Estimated Creatinine Clearance: 107.1 mL/min (by C-G formula based on Cr of 0.33).  Medications:  Scheduled:  . antiseptic oral rinse  15 mL Mouth Rinse TID PC  . cefTRIAXone (ROCEPHIN)  IV  2 g Intravenous Q24H  . furosemide  40 mg Oral Daily  . magnesium oxide  800 mg Oral BID  . metronidazole  500 mg Intravenous Q8H  . multivitamin with minerals  1 tablet Oral Daily  . nutrition supplement (JUVEN)  1 packet Oral BID BM  . pantoprazole  40 mg Oral Daily  . potassium chloride  30 mEq Oral BID  . predniSONE  30 mg Oral Q breakfast  . protein supplement  2 oz Oral QID  . vancomycin  750 mg Intravenous Q12H  . Warfarin - Pharmacist Dosing Inpatient   Does not apply q1800   Assessment: Patient known to pharmacy for dosing of antibiotics. Patient was on warfarin prior to admission at a dose of 5mg  q6pm for hx DVT was on hold at admission, now to restart warfarin with pharmacy dosing assisstance.  Today, 10/29/2015  INR therapeutic   Hgb low but stable, Plt stable   No  reported bleeding  Drug interactions: Flagyl: can increase INR  Goal of Therapy:  INR 2-3   Plan:   Warfarin 2.5mg  x 1 today at 1800   Requiring lower dose vs. Prior to admission   Daily PT/INR  [name], PharmD, BCPS.   Pager: [phone] 10/29/2015 7:41 AM

## 2015-10-29 NOTE — Progress Notes (Signed)
Nutrition Follow-up  DOCUMENTATION CODES:   Non-severe (moderate) malnutrition in context of chronic illness, Obesity unspecified  INTERVENTION:  - Continue ordered supplements: Juven BID, Unjury QID, Prostat once/day - RD will continue to monitor for needs  NUTRITION DIAGNOSIS:   Malnutrition related to chronic illness as evidenced by percent weight loss, moderate depletion of body fat, mild depletion of muscle mass. -ongoing, improving with intakes and supplements  GOAL:   Patient will meet greater than or equal to 90% of their needs -variably met  MONITOR:   PO intake, Supplement acceptance, Labs, Weight trends, Skin, I & O's  REASON FOR ASSESSMENT:   Consult Assessment of nutrition requirement/status  ASSESSMENT:   69 year old male with medical history of CAD status post CABG in July 2016, CHF with EF of 30-35 %, hypertension and history of DVT on Coumadin, obstructive sleep apnea on CPAP, glaucoma, development of ischemic colitis in August 2016 for which he underwent excoriated a laparotomy with total colectomy and ileostomy with prolonged intubation requiring tracheostomy, followed by a complicated course with multiple abdominal and pelvic abscesses that have been drained.   12/15 Per chart review, pt ate 75% of breakfast and lunch 12/13 with no other intakes documented since that time. Per chart review, pt medically stable and plan is to d/c today. Discussion being had with pt at time of RD visit about d/c home; unable to talk with pt at this time.   Likely variably meeting needs with meals and supplements. Medications reviewed. Labs reviewed; K: 3.4 mmol/L, BUN/creatinine elevated, Ca: 8.8 mg/dL.   12/12 - Per chart review, pt has been eating 60-100%, mainly 100%, of meals since 12/8.  - He states that he has a good appetite and feels that he is eating more than he previously was.  - Pt does not like Prostat but does like Unjury and Juven; will keep supplement orders  the same at this time as pt states he is able to tolerate it.  - Encouraged pt to focus on protein-rich foods for wound healing.  - Meeting needs on average with meals and supplements.  12/7 - RD consulted for wound healing - increased protein intake. - Pt was seen by RD yesterday, currently receiving two packets of Juven and 1 Pro-Stat/day - No PO intake logged since 12/3. Pt reports he has been eating well.  12/6 - Patient reports eating fairly well but states that he has had poor appetite d/t his medical issues and multiple hospitalizations since the summer.  - Pt has multiple wounds and has had to have an abdominal abscess drained.  - Patient continues to lose weight, 73 lb since January 2016 (23% weight loss x 11 months, significant for time frame).  - Suspect exact weight amounts to be masked by fluid.  - Patient states he is drinking his Prostat but he hates the taste. - RD to have patient try Juven, as he might like the taste better.  - Will order BID and will decrease Prostat order to once daily. - Nutrition-Focused physical exam completed.  - Findings are mild-moderate fat depletion, mild muscle depletion, and moderate edema.    Diet Order:  Diet regular Room service appropriate?: Yes; Fluid consistency:: Thin  Skin:   Stage 4 sacral pressure ulcer, Stage 2 bilateral heels, abdominal incision  Last BM:  12/14  Height:   Ht Readings from Last 1 Encounters:  10/16/15 '5\' 9"'  (1.753 m)    Weight:   Wt Readings from Last 1 Encounters:  10/29/15 251  lb 12.3 oz (114.2 kg)    Ideal Body Weight:  72.7 kg  BMI:  Body mass index is 37.16 kg/(m^2).  Estimated Nutritional Needs:   Kcal:  2200-2400  Protein:  130-150g  Fluid:  2.1L/day  EDUCATION NEEDS:   No education needs identified at this time     Jarome Matin, RD, LDN Inpatient Clinical Dietitian Pager # [phone] After hours/weekend pager # [phone]

## 2015-10-30 LAB — PROTIME-INR
INR: 2.5 — AB (ref 0.00–1.49)
Prothrombin Time: 26.7 seconds — ABNORMAL HIGH (ref 11.6–15.2)

## 2015-10-30 MED ORDER — HYDROCODONE-ACETAMINOPHEN 5-325 MG PO TABS: 1.0000 | ORAL_TABLET | Freq: Four times a day (QID) | ORAL | Status: DC | PRN

## 2015-10-30 MED ORDER — WARFARIN SODIUM 5 MG PO TABS: 5.0000 mg | ORAL_TABLET | Freq: Once | ORAL | Status: DC

## 2015-10-30 NOTE — Progress Notes (Signed)
Pt seen and examined, stable,  no changes from Dc summary yetserday  Jonathan CovePreetha Arther Heisler, MD

## 2015-10-30 NOTE — Progress Notes (Signed)
[facility]  PTAR arranged for pickup.

## 2015-10-30 NOTE — Progress Notes (Signed)
[facility]- he is acknowledging that he made a bad decision yesterday- "i can't go home yet and need to go to [facility]". Wife and patient agreeable to this plan- plan transfer via EMS. Reece LevyJanet Leida Luton, MSW, Theresia MajorsLCSWA [phone]

## 2015-10-30 NOTE — Progress Notes (Addendum)
ANTICOAGULATION CONSULT NOTE - Follow Up  Pharmacy Consult for Warfarin Indication: DVT  Allergies  Allergen Reactions  . Unasyn [Ampicillin-Sulbactam Sodium] Hives, Itching and Rash    Diffuse rash not responding to Pepcid and Benadryl.  TDD.  Marland Kitchen. Levaquin [Levofloxacin] Nausea Only  . Penicillins Hives  . Doxycycline Rash   Patient Measurements: Height: 5\' 9"  (175.3 cm) Weight: 257 lb 15 oz (117 kg) IBW/kg (Calculated) : 70.7  Vital Signs: Temp: 97.8 F (36.6 C) (12/16 0354) Temp Source: Other (Comment) (12/16 0354) BP: 140/90 mmHg (12/16 0354) Pulse Rate: 91 (12/16 0354)  Labs:  Recent Labs  10/27/15 1015 10/28/15 0423 10/29/15 0424 10/30/15 0513  HGB 10.1* 9.9* 10.4*  --   HCT 32.3* 31.4* 33.0*  --   PLT 349 290 313  --   LABPROT  --  29.1* 28.1* 26.7*  INR  --  2.81* 2.68* 2.50*  CREATININE  --  0.43* 0.33*  --    Estimated Creatinine Clearance: 108.4 mL/min (by C-G formula based on Cr of 0.33).  Medications:  Scheduled:  . antiseptic oral rinse  15 mL Mouth Rinse TID PC  . cephALEXin  500 mg Oral 4 times per day  . furosemide  40 mg Oral Daily  . magnesium oxide  800 mg Oral BID  . metroNIDAZOLE  500 mg Oral 3 times per day  . multivitamin with minerals  1 tablet Oral Daily  . nutrition supplement (JUVEN)  1 packet Oral BID BM  . pantoprazole  40 mg Oral Daily  . potassium chloride  30 mEq Oral BID  . predniSONE  20 mg Oral Q breakfast  . protein supplement  2 oz Oral QID  . Warfarin - Pharmacist Dosing Inpatient   Does not apply q1800   Assessment: Patient known to pharmacy for dosing of antibiotics. Patient was on warfarin prior to admission at a dose of 5mg  q6pm for hx DVT was on hold at admission, now to restart warfarin with pharmacy dosing assisstance.  Today, 10/30/2015  INR therapeutic   Hgb low but stable, Plt stable   No reported bleeding  Drug interactions: Flagyl: can increase INR  Goal of Therapy:  INR 2-3   Plan:   Warfarin  5mg  x 1 today at 1800   Requiring lower dose vs. Prior to admission   While on metronidazole, consider reducing warfarin regimen to 5mg  alternating with 2.5mg  with more frequent INR monitoring (2x/wk)  Daily PT/INR  Juliette Alcideustin Joan Avetisyan, PharmD, BCPS.   Pager: [phone] 10/30/2015 8:37 AM

## 2015-11-02 ENCOUNTER — Other Ambulatory Visit (HOSPITAL_COMMUNITY): Payer: Self-pay | Admitting: Interventional Radiology

## 2015-11-02 ENCOUNTER — Other Ambulatory Visit: Payer: Self-pay | Admitting: General Surgery

## 2015-11-02 ENCOUNTER — Other Ambulatory Visit: Payer: Self-pay | Admitting: Radiology

## 2015-11-02 DIAGNOSIS — T814XXD Infection following a procedure, subsequent encounter: Principal | ICD-10-CM

## 2015-11-02 DIAGNOSIS — K651 Peritoneal abscess: Principal | ICD-10-CM

## 2015-11-02 DIAGNOSIS — IMO0001 Reserved for inherently not codable concepts without codable children: Secondary | ICD-10-CM

## 2015-11-10 ENCOUNTER — Encounter: Payer: Medicare Other | Attending: Physical Medicine & Rehabilitation

## 2015-11-10 ENCOUNTER — Inpatient Hospital Stay: Payer: Medicare Other | Admitting: Physical Medicine & Rehabilitation

## 2015-11-18 ENCOUNTER — Other Ambulatory Visit: Payer: Medicare Other

## 2015-11-19 ENCOUNTER — Encounter (HOSPITAL_COMMUNITY): Payer: Self-pay | Admitting: Emergency Medicine

## 2015-11-19 ENCOUNTER — Emergency Department (HOSPITAL_COMMUNITY): Payer: Medicare Other

## 2015-11-19 ENCOUNTER — Inpatient Hospital Stay (HOSPITAL_COMMUNITY)
Admission: EM | Admit: 2015-11-19 | Discharge: 2015-11-23 | DRG: 871 | Disposition: A | Discharge status: Skilled Nursing Facility | Payer: Medicare Other | Attending: Internal Medicine | Admitting: Internal Medicine

## 2015-11-19 DIAGNOSIS — IMO0002 Reserved for concepts with insufficient information to code with codable children: Secondary | ICD-10-CM

## 2015-11-19 DIAGNOSIS — A419 Sepsis, unspecified organism: Principal | ICD-10-CM | POA: Diagnosis present

## 2015-11-19 DIAGNOSIS — I252 Old myocardial infarction: Secondary | ICD-10-CM

## 2015-11-19 DIAGNOSIS — I447 Left bundle-branch block, unspecified: Secondary | ICD-10-CM | POA: Diagnosis present

## 2015-11-19 DIAGNOSIS — R059 Cough, unspecified: Secondary | ICD-10-CM | POA: Diagnosis present

## 2015-11-19 DIAGNOSIS — R05 Cough: Secondary | ICD-10-CM

## 2015-11-19 DIAGNOSIS — I2510 Atherosclerotic heart disease of native coronary artery without angina pectoris: Secondary | ICD-10-CM | POA: Diagnosis present

## 2015-11-19 DIAGNOSIS — J069 Acute upper respiratory infection, unspecified: Secondary | ICD-10-CM | POA: Diagnosis present

## 2015-11-19 DIAGNOSIS — N138 Other obstructive and reflux uropathy: Secondary | ICD-10-CM | POA: Diagnosis present

## 2015-11-19 DIAGNOSIS — G4733 Obstructive sleep apnea (adult) (pediatric): Secondary | ICD-10-CM | POA: Diagnosis present

## 2015-11-19 DIAGNOSIS — Z888 Allergy status to other drugs, medicaments and biological substances status: Secondary | ICD-10-CM

## 2015-11-19 DIAGNOSIS — J9811 Atelectasis: Secondary | ICD-10-CM | POA: Diagnosis present

## 2015-11-19 DIAGNOSIS — Z86718 Personal history of other venous thrombosis and embolism: Secondary | ICD-10-CM

## 2015-11-19 DIAGNOSIS — Z6835 Body mass index (BMI) 35.0-35.9, adult: Secondary | ICD-10-CM

## 2015-11-19 DIAGNOSIS — R509 Fever, unspecified: Secondary | ICD-10-CM

## 2015-11-19 DIAGNOSIS — N401 Enlarged prostate with lower urinary tract symptoms: Secondary | ICD-10-CM | POA: Diagnosis present

## 2015-11-19 DIAGNOSIS — L89159 Pressure ulcer of sacral region, unspecified stage: Secondary | ICD-10-CM | POA: Diagnosis present

## 2015-11-19 DIAGNOSIS — Z881 Allergy status to other antibiotic agents status: Secondary | ICD-10-CM

## 2015-11-19 DIAGNOSIS — H409 Unspecified glaucoma: Secondary | ICD-10-CM | POA: Diagnosis present

## 2015-11-19 DIAGNOSIS — Z951 Presence of aortocoronary bypass graft: Secondary | ICD-10-CM

## 2015-11-19 DIAGNOSIS — L89154 Pressure ulcer of sacral region, stage 4: Secondary | ICD-10-CM

## 2015-11-19 DIAGNOSIS — I5042 Chronic combined systolic (congestive) and diastolic (congestive) heart failure: Secondary | ICD-10-CM | POA: Diagnosis present

## 2015-11-19 DIAGNOSIS — Z880 Allergy status to penicillin: Secondary | ICD-10-CM

## 2015-11-19 DIAGNOSIS — D638 Anemia in other chronic diseases classified elsewhere: Secondary | ICD-10-CM | POA: Diagnosis present

## 2015-11-19 DIAGNOSIS — I10 Essential (primary) hypertension: Secondary | ICD-10-CM | POA: Diagnosis present

## 2015-11-19 DIAGNOSIS — Z932 Ileostomy status: Secondary | ICD-10-CM

## 2015-11-19 DIAGNOSIS — Z7901 Long term (current) use of anticoagulants: Secondary | ICD-10-CM

## 2015-11-19 DIAGNOSIS — J40 Bronchitis, not specified as acute or chronic: Secondary | ICD-10-CM | POA: Diagnosis present

## 2015-11-19 LAB — CBC WITH DIFFERENTIAL/PLATELET
BASOS PCT: 0 %
Basophils Absolute: 0 10*3/uL (ref 0.0–0.1)
EOS ABS: 0 10*3/uL (ref 0.0–0.7)
Eosinophils Relative: 0 %
HEMATOCRIT: 34.8 % — AB (ref 39.0–52.0)
HEMOGLOBIN: 11.1 g/dL — AB (ref 13.0–17.0)
Lymphocytes Relative: 9 %
Lymphs Abs: 0.6 10*3/uL — ABNORMAL LOW (ref 0.7–4.0)
MCH: 28.2 pg (ref 26.0–34.0)
MCHC: 31.9 g/dL (ref 30.0–36.0)
MCV: 88.5 fL (ref 78.0–100.0)
Monocytes Absolute: 0.7 10*3/uL (ref 0.1–1.0)
Monocytes Relative: 11 %
NEUTROS ABS: 5.5 10*3/uL (ref 1.7–7.7)
NEUTROS PCT: 80 %
Platelets: 308 10*3/uL (ref 150–400)
RBC: 3.93 MIL/uL — AB (ref 4.22–5.81)
RDW: 18.3 % — ABNORMAL HIGH (ref 11.5–15.5)
WBC: 6.9 10*3/uL (ref 4.0–10.5)

## 2015-11-19 LAB — COMPREHENSIVE METABOLIC PANEL
ALBUMIN: 2.6 g/dL — AB (ref 3.5–5.0)
ALK PHOS: 65 U/L (ref 38–126)
ALT: 12 U/L — ABNORMAL LOW (ref 17–63)
ANION GAP: 8 (ref 5–15)
AST: 19 U/L (ref 15–41)
BUN: 12 mg/dL (ref 6–20)
CALCIUM: 8.8 mg/dL — AB (ref 8.9–10.3)
CO2: 24 mmol/L (ref 22–32)
Chloride: 106 mmol/L (ref 101–111)
Creatinine, Ser: 0.5 mg/dL — ABNORMAL LOW (ref 0.61–1.24)
GFR calc Af Amer: >60 mL/min (ref >60)
GFR calc non Af Amer: >60 mL/min (ref >60)
Glucose, Bld: 117 mg/dL — ABNORMAL HIGH (ref 65–99)
POTASSIUM: 3.7 mmol/L (ref 3.5–5.1)
SODIUM: 138 mmol/L (ref 135–145)
TOTAL PROTEIN: 5.9 g/dL — AB (ref 6.5–8.1)
Total Bilirubin: 0.5 mg/dL (ref 0.3–1.2)

## 2015-11-19 LAB — I-STAT TROPONIN, ED: TROPONIN I, POC: 0.04 ng/mL (ref 0.00–0.08)

## 2015-11-19 LAB — I-STAT CG4 LACTIC ACID, ED: Lactic Acid, Venous: 1.28 mmol/L (ref 0.5–2.0)

## 2015-11-19 MED ORDER — SODIUM CHLORIDE 0.9 % IV BOLUS (SEPSIS)
1000.0000 mL | INTRAVENOUS | Status: DC
  Administered 2015-11-19 – 2015-11-20 (×2): 1000 mL via INTRAVENOUS

## 2015-11-19 MED ORDER — IBUPROFEN 800 MG PO TABS
800.0000 mg | ORAL_TABLET | Freq: Once | ORAL | Status: AC
  Administered 2015-11-19: 800 mg via ORAL
  Filled 2015-11-19: qty 1

## 2015-11-19 MED ORDER — VANCOMYCIN HCL IN DEXTROSE 1-5 GM/200ML-% IV SOLN
1000.0000 mg | Freq: Once | INTRAVENOUS | Status: AC
  Administered 2015-11-19: 1000 mg via INTRAVENOUS
  Filled 2015-11-19: qty 200

## 2015-11-19 NOTE — ED Provider Notes (Signed)
CSN: [id number]     Arrival date & time 11/19/15  2251 History  By signing my name below, I, Elon Spanner, attest that this documentation has been prepared under the direction and in the presence of Liridona Mashaw, MD. Electronically Signed: Elon Spanner, ED Scribe. 11/19/2015. 11:22 PM.    Chief Complaint  Patient presents with  . Fever   Patient is a 70 y.o. male presenting with fever. The history is provided by the patient and a relative. No language interpreter was used.  Fever Temp source:  Oral Severity:  Moderate Onset quality:  Gradual Progression:  Unchanged Chronicity:  Recurrent Relieved by:  Nothing Worsened by:  Nothing tried Ineffective treatments:  Acetaminophen Associated symptoms: cough   Associated symptoms: no chest pain, no rash and no vomiting   Risk factors: recent surgery    HPI Comments: Jonathan Le is a 70 y.o. male accompanied by patient from [facility] who presents to the Emergency Department complaining of fever TMAX 102 PTA.  Associated symptoms include cough and nasal congestion 5 days ago.  The patient is currently a resident at [facility] after discharged 3 weeks ago from a 3 week admission for similar abscess.  The daughter reports he has had 5 similar abscesses after complete colectomy by Dr. Dwain Sarna following a perforation.  The patient currently has colostomy placed.  The daughter reports the patient spends a significant amount of time in bed at the rehabilitation facility.     Past Medical History  Diagnosis Date  . Anxiety   . Hypertension   . Arthritis   . Sleep apnea     wears CPAP nightly  . Glaucoma   . CHF (congestive heart failure) (HCC)     MI, with open heart surgery x 30 sdays ago per pt. (06/26/15)  . S/P CABG (coronary artery bypass graft)   . MI (myocardial infarction) [facility])    Past Surgical History  Procedure Laterality Date  . Hernia repair      right  . Eye surgery      bil cataract  . Wisdom tooth  extraction    . Fasciotomy foot / toe Left     foot  . Cardiac catheterization N/A 05/20/2015    Procedure: Left Heart Cath and Coronary Angiography;  Surgeon: Kathleene Hazel, MD;  Location: [facility] INVASIVE CV LAB;  Service: Cardiovascular;  Laterality: N/A;  . Coronary artery bypass graft N/A 05/22/2015    Procedure: CORONARY ARTERY BYPASS GRAFTING (CABG), ON PUMP, TIMES THREE, USING LEFT INTERNAL MAMMARY ARTERY, RIGHT GREATER SAPHENOUS VEIN HARVESTED ENDOSCOPICALLY;  Surgeon: Kerin Perna, MD;  Location: [facility] OR;  Service: Open Heart Surgery;  Laterality: N/A;  LIMA-LAD, SVG-OM, SVG-PD  . Tee without cardioversion N/A 05/22/2015    Procedure: TRANSESOPHAGEAL ECHOCARDIOGRAM (TEE);  Surgeon: Kerin Perna, MD;  Location: [facility] OR;  Service: Open Heart Surgery;  Laterality: N/A;  . Laparotomy N/A 06/18/2015    Procedure: EXPLORATORY LAPAROTOMY;  Surgeon: Emelia Loron, MD;  Location: [facility] OR;  Service: General;  Laterality: N/A;  . Laparotomy N/A 06/20/2015    Procedure: WASHOUT OF ABDOMEN, CLOSURE OF ABDOMINAL WALL WITH PLACEMENT OF WOUND VAC AND ILLEOSTOMY ;  Surgeon: Almond Lint, MD;  Location: [facility] OR;  Service: General;  Laterality: N/A;  . Tracheostomy tube placement N/A 07/03/2015    Procedure: TRACHEOSTOMY;  Surgeon: Serena Colonel, MD;  Location: [facility] OR;  Service: ENT;  Laterality: N/A;   Family History  Problem Relation Age of Onset  . Alzheimer's  disease Father   . Rheum arthritis Mother    Social History  Substance Use Topics  . Smoking status: Never Smoker   . Smokeless tobacco: Never Used  . Alcohol Use: No    Review of Systems  Constitutional: Positive for fever.  HENT: Negative for drooling.   Respiratory: Positive for cough.   Cardiovascular: Negative for chest pain and palpitations.  Gastrointestinal: Negative for vomiting.  Skin: Negative for rash.  All other systems reviewed and are negative.  10 Systems reviewed and all are negative for acute change except as noted in  the HPI.   Allergies  Unasyn; Levaquin; Penicillins; and Doxycycline  Home Medications   Prior to Admission medications   Medication Sig Start Date End Date Taking? Authorizing Provider  acetaminophen (TYLENOL) 325 MG tablet Take 2 tablets (650 mg total) by mouth 2 (two) times daily with breakfast and lunch. 09/24/15  Yes Evlyn Kanner Love, PA-C  antiseptic oral rinse (BIOTENE) LIQD 15 mLs by Mouth Rinse route 3 (three) times daily after meals. 09/24/15  Yes Evlyn Kanner Love, PA-C  ascorbic acid (VITAMIN C) 500 MG tablet Take 500 mg by mouth 2 (two) times daily.   Yes Historical Provider, MD  diclofenac sodium (VOLTAREN) 1 % GEL Apply 2 g topically 4 (four) times daily. 09/24/15  Yes Evlyn Kanner Love, PA-C  famotidine (PEPCID) 20 MG tablet Take 1 tablet (20 mg total) by mouth 2 (two) times daily. 09/24/15  Yes Evlyn Kanner Love, PA-C  finasteride (PROSCAR) 5 MG tablet Take 1 tablet (5 mg total) by mouth daily. 09/24/15  Yes Evlyn Kanner Love, PA-C  furosemide (LASIX) 40 MG tablet Take 1 tablet (40 mg total) by mouth daily. 09/24/15  Yes Evlyn Kanner Love, PA-C  magnesium oxide (MAG-OX) 400 (241.3 MG) MG tablet Take 2 tablets (800 mg total) by mouth 2 (two) times daily. 09/24/15  Yes Evlyn Kanner Love, PA-C  Multiple Vitamins-Minerals (DECUBI-VITE PO) Take 1 tablet by mouth daily.   Yes Historical Provider, MD  ondansetron (ZOFRAN) 4 MG tablet Take 1 tablet (4 mg total) by mouth every 6 (six) hours as needed for nausea. 09/24/15  Yes Evlyn Kanner Love, PA-C  potassium chloride (K-DUR,KLOR-CON) 10 MEQ tablet Take 30 mEq by mouth 2 (two) times daily.   Yes Historical Provider, MD  sucralfate (CARAFATE) 1 GM/10ML suspension Take 1 g by mouth 4 (four) times daily -  with meals and at bedtime.   Yes Historical Provider, MD  tamsulosin (FLOMAX) 0.4 MG CAPS capsule Take 1 capsule (0.4 mg total) by mouth daily after supper. 09/24/15  Yes Evlyn Kanner Love, PA-C  warfarin (COUMADIN) 5 MG tablet Take 1 tablet (5 mg total) by mouth daily  at 6 PM. 09/24/15  Yes Evlyn Kanner Love, PA-C  Amino Acids-Protein Hydrolys (FEEDING SUPPLEMENT, PRO-STAT SUGAR FREE 64,) LIQD Take 30 mLs by mouth 3 (three) times daily. 09/24/15   Jacquelynn Cree, PA-C  cephALEXin (KEFLEX) 500 MG capsule Take 1 capsule (500 mg total) by mouth every 6 (six) hours. For 2 weeks 10/29/15   Zannie Cove, MD  HYDROcodone-acetaminophen (NORCO/VICODIN) 5-325 MG tablet Take 1 tablet by mouth every 6 (six) hours as needed for moderate pain. 10/30/15   Zannie Cove, MD  metroNIDAZOLE (FLAGYL) 500 MG tablet Take 1 tablet (500 mg total) by mouth every 8 (eight) hours. 10/29/15   Zannie Cove, MD  pantoprazole (PROTONIX) 40 MG tablet Take 40 mg by mouth daily.    Historical Provider, MD  predniSONE (DELTASONE) 20  MG tablet Take 1 tablet (20 mg total) by mouth daily with breakfast. For 1 day then 10mg  for 2days then STOP 10/30/15   Zannie CovePreetha Joseph, MD   BP 99/57 mmHg  Pulse 112  Temp(Src) 98.7 F (37.1 C) (Oral)  Resp 20  SpO2 95% Physical Exam  Constitutional: He is oriented to person, place, and time. He appears well-developed and well-nourished. No distress.  HENT:  Head: Normocephalic and atraumatic.  Mouth/Throat: Oropharynx is clear and moist.  Eyes: Conjunctivae and EOM are normal. Pupils are equal, round, and reactive to light.  Pupils equal and reactive.   Neck: Normal range of motion. Neck supple. No tracheal deviation present.  No bruit's.  No stridor.   Cardiovascular: Normal rate, regular rhythm and intact distal pulses.   Pulmonary/Chest: Effort normal. No respiratory distress. He has no wheezes. He has no rales.  Diminished breath sounds  Abdominal: Soft. Bowel sounds are normal. There is no tenderness. There is no rebound and no guarding.  Good bowel sounds.  Ostomy CDI, JP drain with scant output  Musculoskeletal: Normal range of motion.  Intact DP.  Trace pedal edema.   Neurological: He is alert and oriented to person, place, and time.  DTR's  intact.   Skin: Skin is warm and dry.  Skin warm and dry.  1.5-2" wound in diameter just above the gluteal cleft deep stage three early four.  Hemostatic.  Mild debris on packing without odor.    Psychiatric: He has a normal mood and affect. His behavior is normal.  Nursing note and vitals reviewed.   ED Course  Procedures (including critical care time)  DIAGNOSTIC STUDIES: Oxygen Saturation is 95% on RA, adequate by my interpretation.    COORDINATION OF CARE:  11:26 PM Will order labs.  Patient and daughter agree with plan.     Labs Review Labs Reviewed  CULTURE, BLOOD (ROUTINE X 2)  CULTURE, BLOOD (ROUTINE X 2)  URINE CULTURE  COMPREHENSIVE METABOLIC PANEL  URINALYSIS, ROUTINE W REFLEX MICROSCOPIC (NOT [facility])  CBC WITH DIFFERENTIAL/PLATELET  I-STAT CG4 LACTIC ACID, ED    Imaging Review No results found. I have personally reviewed and evaluated these images and lab results as part of my medical decision-making.   EKG Interpretation None      MDM   Final diagnoses:  None    Results for orders placed or performed during the hospital encounter of 11/19/15  Comprehensive metabolic panel  Result Value Ref Range   Sodium 138 135 - 145 mmol/L   Potassium 3.7 3.5 - 5.1 mmol/L   Chloride 106 101 - 111 mmol/L   CO2 24 22 - 32 mmol/L   Glucose, Bld 117 (H) 65 - 99 mg/dL   BUN 12 6 - 20 mg/dL   Creatinine, Ser 1.610.50 (L) 0.61 - 1.24 mg/dL   Calcium 8.8 (L) 8.9 - 10.3 mg/dL   Total Protein 5.9 (L) 6.5 - 8.1 g/dL   Albumin 2.6 (L) 3.5 - 5.0 g/dL   AST 19 15 - 41 U/L   ALT 12 (L) 17 - 63 U/L   Alkaline Phosphatase 65 38 - 126 U/L   Total Bilirubin 0.5 0.3 - 1.2 mg/dL   GFR calc non Af Amer >60 >60 mL/min   GFR calc Af Amer >60 >60 mL/min   Anion gap 8 5 - 15  CBC with Differential  Result Value Ref Range   WBC 6.9 4.0 - 10.5 K/uL   RBC 3.93 (L) 4.22 - 5.81 MIL/uL  Hemoglobin 11.1 (L) 13.0 - 17.0 g/dL   HCT 16.1 (L) 09.6 - 04.5 %   MCV 88.5 78.0 - 100.0 fL    MCH 28.2 26.0 - 34.0 pg   MCHC 31.9 30.0 - 36.0 g/dL   RDW 40.9 (H) 81.1 - 91.4 %   Platelets 308 150 - 400 K/uL   Neutrophils Relative % 80 %   Neutro Abs 5.5 1.7 - 7.7 K/uL   Lymphocytes Relative 9 %   Lymphs Abs 0.6 (L) 0.7 - 4.0 K/uL   Monocytes Relative 11 %   Monocytes Absolute 0.7 0.1 - 1.0 K/uL   Eosinophils Relative 0 %   Eosinophils Absolute 0.0 0.0 - 0.7 K/uL   Basophils Relative 0 %   Basophils Absolute 0.0 0.0 - 0.1 K/uL  Brain natriuretic peptide  Result Value Ref Range   B Natriuretic Peptide 453.8 (H) 0.0 - 100.0 pg/mL  Blood gas, venous ([facility], [facility], [facility])  Result Value Ref Range   FIO2 0.21    pH, Ven 7.464 (H) 7.250 - 7.300   pCO2, Ven 35.0 (L) 45.0 - 50.0 mmHg   pO2, Ven 63.8 (H) 30.0 - 45.0 mmHg   Bicarbonate 24.4 (H) 20.0 - 24.0 mEq/L   TCO2 21.9 0 - 100 mmol/L   Acid-Base Excess 1.8 0.0 - 2.0 mmol/L   O2 Saturation 89.4 %   Patient temperature 101.3    Collection site VEIN    Drawn by COLLECTED BY LABORATORY    Sample type VEIN   Procalcitonin  Result Value Ref Range   Procalcitonin 0.20 ng/mL  I-Stat CG4 Lactic Acid, ED  (not at [facility])  Result Value Ref Range   Lactic Acid, Venous 1.28 0.5 - 2.0 mmol/L  I-stat troponin, ED  Result Value Ref Range   Troponin i, poc 0.04 0.00 - 0.08 ng/mL   Comment 3           Dg Chest 2 View  11/20/2015  CLINICAL DATA:  Surgery 5 weeks ago, abscesses, onset of fever 48 hours ago, cough and congestion, history coronary artery disease post CABG and MI, CHF, hypertension EXAM: CHEST  2 VIEW COMPARISON:  08/23/2015 FINDINGS: Minimal enlargement of cardiac silhouette post CABG. Mediastinal contours normal. Minimal vascular congestion. Improved pulmonary infiltrates and basilar opacification versus previous exam. Mild residual opacity at the posterior lung bases on lateral view. This could represent atelectasis or loculated fluid. No definite acute infiltrate or pneumothorax. RIGHT glenohumeral degenerative changes. IMPRESSION:  Enlargement of cardiac silhouette post CABG with minimal pulmonary vascular congestion. Residual opacity in the posterior lower lungs question atelectasis versus residual pleural fluid. Electronically Signed   By: Ulyses Southward M.D.   On: 11/20/2015 00:01   Ct Abdomen Pelvis W Contrast  10/25/2015  CLINICAL DATA:  70 year old with subtotal colectomy and ileostomy due to ischemic colitis 06/18/2015, subsequent hospitalization in October for abdominal abscess drainage and possible stump breakdown, percutaneous drainage of a large intra-abdominal abscess 10/17/2015, now with persistent leukocytosis. Patient also has a sacral decubitus ulcer. EXAM: CT ABDOMEN AND PELVIS WITH CONTRAST TECHNIQUE: Multidetector CT imaging of the abdomen and pelvis was performed using the standard protocol following bolus administration of intravenous contrast. CONTRAST:  OMNIPAQUE IOHEXOL 300 MG/ML IV. Oral contrast was also administered. COMPARISON:  Multiple prior CT abdomen and pelvis examinations dating back to 06/18/2015, most recently 10/17/2015 at the time of drainage catheter placement. FINDINGS: Lower chest: Bilateral pleural effusions, right greater than left, with associated passive atelectasis in the lower lobes, increased  since the 10/16/2015 examination. Calcified granuloma again noted in the left lower lobe. Heart enlarged with extensive 3 vessel coronary atherosclerosis. Hepatobiliary: Liver normal in size. Relative enlargement of the left lobe and caudate lobe compared to the right lobe. Gallbladder contracted, accounting for the mucosal enhancement. No calcified gallstones. No biliary ductal dilation. Pancreas: Normal in appearance without evidence of mass, ductal dilation, or inflammation. Spleen: Normal in size and appearance. Adrenals/Urinary Tract: Normal appearing adrenal glands. Nonobstructing bilateral renal calculi as noted previously. Kidneys otherwise normal in appearance. Normal-appearing urinary  bladder. Stomach/Bowel: Stomach normal in appearance for the degree of distention. Wall thickening and edema involving several loops of what are likely mid and distal jejunum. Small extraluminal gas bubbles adjacent to an abnormal loop in the left upper pelvis. Remainder of the small bowel normal in appearance. Normal-appearing ileostomy in the right periumbilical region. Fluid-filled Hartmann pouch as noted previously. Right paramedian sacral decubitus ulcer. Vascular/Lymphatic: Mild to moderate aortoiliofemoral atherosclerosis without aneurysm. Widely patent visceral arteries. 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 Reproductive: Moderate to marked prostate gland enlargement, unchanged. Other: Edema involving the small bowel mesentery. Previously identified abscess completely drained, with no residual fluid collection. No new abscess. Severe generalized body wall edema. Musculoskeletal: Degenerative disc disease, spondylosis and facet degenerative changes diffusely throughout the lumbar spine with severe multifactorial spinal stenosis at L2-3 and L4-5 and moderate multifactorial spinal stenosis at L3-4. Bilateral hip joint effusions. IMPRESSION: 1. No evidence of residual abdominal abscess after percutaneous drainage. No new abscess elsewhere. 2. Wall thickening/edema involving a several cm segment of mid and distal jejunum, associated with edema in the small bowel mesentery. Extraluminal gas bubbles in the left upper pelvis are in proximity to one of these abnormal bowel loops and may indicate microperforation. 3. Satisfactory appearance of the ileostomy in the right upper pelvis. 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 5. Bilateral pleural effusions, right greater than left, with associated passive atelectasis in the lower lobes. 6. Anasarca. 7. Moderate to marked prostate gland enlargement. 8. Osseous findings as above, with multilevel multifactorial  spinal stenosis. 9. Right paramedian sacral decubitus ulcer with possible osteomyelitis involving the last coccygeal segment. 10. Bilateral hip joint effusions. Electronically Signed   By: Hulan Saas M.D.   On: 10/25/2015 15:50   Ir Sinus/fist Tube Chk-non Gi  10/26/2015  CLINICAL DATA:  History of partial colectomy for ischemic colitis complicated by development of multiple postoperative abdominal fluid collections / abscesses and several ultrasound and fluoroscopic guided percutaneous drainage catheter placements. Patient recently underwent a CT-guided abdominal fluid collection percutaneous drainage catheter placement on 10/17/2015. Subsequent CT scan performed 10/25/2015 demonstrates complete resolution of previously drained intra-abdominal abscess. As such, patient presents today for fluoroscopic guided percutaneous drainage catheter injection. EXAM: SINUS TRACT INJECTION/FISTULOGRAM COMPARISON:  CT abdomen pelvis -10/25/2015; 08/10/2015; 07/29/2015; 07/21/2015; CT-guided abdominal abscess drainage catheter placement- 10/17/2015; 07/21/2015; ultrasound-guided left abdominal wall percutaneous drainage catheter placement - 08/12/2015 CONTRAST:  15 cc Omnipaque 300 FLUOROSCOPY TIME:  1 minute, 12 seconds (65 mGy) TECHNIQUE: The patient was positioned supine on the fluoroscopy table. A preprocedural spot fluoroscopic image was obtained of the midline of the abdomen existing solitary percutaneous drainage catheter. Multiple spot fluoroscopic and radiographic images were obtained in various obliquities following the injection of a small amount of contrast. Images were reviewed and the drainage catheter was flushed with a small amount of saline and reconnected to a JP bulb. A dressing was placed. The patient tolerated the procedure well without  immediate postprocedural complication. FINDINGS: Preprocedural spot fluoroscopic image demonstrates unchanged positioning of the solitary existing percutaneous  drainage catheter with end coiled and locked over the midline of the abdomen. Contrast injection demonstrates opacification of the cheek of breast abscess cavity but was negative for definitive communication with any adjacent loop of large or small intestine. IMPRESSION: Contrast injection of the existing percutaneous drainage catheter is negative for the presence of a fistulous connection to an adjacent loop of large or small bowel. PLAN: Above findings were discussed with referring surgical p.a., Barnetta Chapel. [facility] wants the patient to undergo a contrast-enhanced enema to further evaluate for anastomotic leak prior to removal of the percutaneous drainage catheter. If the contrast-enhanced enema is also negative for fistulous connection to the residual percutaneous drainage catheter, the drainage catheter may be removed at the patient's bedside as per the recommendations of [facility]. Electronically Signed   By: Simonne Come M.D.   On: 10/26/2015 13:46   Dg Colon W/water Sol Cm  10/26/2015  CLINICAL DATA:  Intra-abdominal abscess. Evaluate for rectal stump leak. EXAM: WATER SOLUBLE CONTRAST ENEMA FLUOROSCOPY TIME:  Radiation Exposure Index (as provided by the fluoroscopic device): 74.6 mGy If the device does not provide the exposure index: Fluoroscopy Time:  0 minutes 57 seconds. Number of Acquired Images:  12 COMPARISON:  CT abdomen pelvis 10/25/2015. FINDINGS: Scout view of the abdomen shows a percutaneous pigtail catheter terminating in the low central abdomen. Stomach is distended with air. Mild gaseous distention of small bowel. After digital rectal examination, enema tip was inserted into the rectum. Small amount of water-soluble contrast was allowed to infuse under gravity. Rectal stump opacifies. A thin trail of contrast extends from the stump, without opacification of additional bowel or leakage of contrast. Delayed images suggest rectal prolapse. IMPRESSION: 1. Opacification of the rectal stump with a thin  trail of contrast extending from the stump. No definitive evidence of a fistulous connection to small bowel. No leak. 2. Suspect rectal prolapse. Electronically Signed   By: Leanna Battles M.D.   On: 10/26/2015 16:07    Medications  sodium chloride 0.9 % bolus 1,000 mL (1,000 mLs Intravenous New Bag/Given 11/20/15 0052)  vancomycin (VANCOCIN) IVPB 1000 mg/200 mL premix (0 mg Intravenous Stopped 11/20/15 0052)  ibuprofen (ADVIL,MOTRIN) tablet 800 mg (800 mg Oral Given 11/19/15 2350)  ceFEPIme (MAXIPIME) 2 g in dextrose 5 % 50 mL IVPB (2 g Intravenous New Bag/Given 11/20/15 0053)   ED ECG REPORT   Date: 11/20/2015  Rate: 93  Rhythm: normal sinus rhythm  QRS Axis: left  Intervals: normal  ST/T Wave abnormalities: normal  Conduction Disutrbances:left bundle branch block  Narrative Interpretation:   Old EKG Reviewed: none available  I have personally reviewed the EKG tracing and agree with the computerized printout as noted.  I personally performed the services described in this documentation, which was scribed in my presence. The recorded information has been reviewed and is accurate.      Cy Blamer, MD 11/20/15 [phone]

## 2015-11-19 NOTE — ED Notes (Addendum)
Pt bib by EMS from [facility] after patient was running 102 temp tonight. Given unkown amount of tylenol at 915pm. Patient has sacral wound and SNF reports that they have had difficulty flushing his abdominal drains today. Alert and oriented.

## 2015-11-19 NOTE — ED Notes (Signed)
Pt in radiology 

## 2015-11-19 NOTE — ED Notes (Signed)
Bed: RESB Expected date:  Expected time:  Means of arrival:  Comments: EMS 69yo M; fever 102 / sacral wound / ? Colostomy prob

## 2015-11-20 ENCOUNTER — Encounter (HOSPITAL_COMMUNITY): Payer: Self-pay | Admitting: Emergency Medicine

## 2015-11-20 ENCOUNTER — Inpatient Hospital Stay (HOSPITAL_COMMUNITY): Payer: Medicare Other

## 2015-11-20 DIAGNOSIS — R05 Cough: Secondary | ICD-10-CM | POA: Diagnosis present

## 2015-11-20 DIAGNOSIS — Z951 Presence of aortocoronary bypass graft: Secondary | ICD-10-CM

## 2015-11-20 DIAGNOSIS — L89154 Pressure ulcer of sacral region, stage 4: Secondary | ICD-10-CM | POA: Diagnosis present

## 2015-11-20 DIAGNOSIS — D638 Anemia in other chronic diseases classified elsewhere: Secondary | ICD-10-CM | POA: Diagnosis present

## 2015-11-20 DIAGNOSIS — R059 Cough, unspecified: Secondary | ICD-10-CM | POA: Diagnosis present

## 2015-11-20 DIAGNOSIS — G4733 Obstructive sleep apnea (adult) (pediatric): Secondary | ICD-10-CM | POA: Diagnosis present

## 2015-11-20 DIAGNOSIS — K651 Peritoneal abscess: Secondary | ICD-10-CM | POA: Diagnosis not present

## 2015-11-20 DIAGNOSIS — I5042 Chronic combined systolic (congestive) and diastolic (congestive) heart failure: Secondary | ICD-10-CM | POA: Diagnosis present

## 2015-11-20 DIAGNOSIS — J40 Bronchitis, not specified as acute or chronic: Secondary | ICD-10-CM | POA: Diagnosis present

## 2015-11-20 DIAGNOSIS — I252 Old myocardial infarction: Secondary | ICD-10-CM | POA: Diagnosis not present

## 2015-11-20 DIAGNOSIS — N401 Enlarged prostate with lower urinary tract symptoms: Secondary | ICD-10-CM

## 2015-11-20 DIAGNOSIS — Z880 Allergy status to penicillin: Secondary | ICD-10-CM | POA: Diagnosis not present

## 2015-11-20 DIAGNOSIS — Z86718 Personal history of other venous thrombosis and embolism: Secondary | ICD-10-CM | POA: Diagnosis not present

## 2015-11-20 DIAGNOSIS — R509 Fever, unspecified: Secondary | ICD-10-CM | POA: Diagnosis present

## 2015-11-20 DIAGNOSIS — A419 Sepsis, unspecified organism: Secondary | ICD-10-CM | POA: Diagnosis not present

## 2015-11-20 DIAGNOSIS — I2510 Atherosclerotic heart disease of native coronary artery without angina pectoris: Secondary | ICD-10-CM | POA: Diagnosis present

## 2015-11-20 DIAGNOSIS — Z881 Allergy status to other antibiotic agents status: Secondary | ICD-10-CM | POA: Diagnosis not present

## 2015-11-20 DIAGNOSIS — J9811 Atelectasis: Secondary | ICD-10-CM | POA: Diagnosis present

## 2015-11-20 DIAGNOSIS — N138 Other obstructive and reflux uropathy: Secondary | ICD-10-CM | POA: Diagnosis present

## 2015-11-20 DIAGNOSIS — Z888 Allergy status to other drugs, medicaments and biological substances status: Secondary | ICD-10-CM | POA: Diagnosis not present

## 2015-11-20 DIAGNOSIS — Z7901 Long term (current) use of anticoagulants: Secondary | ICD-10-CM | POA: Diagnosis not present

## 2015-11-20 DIAGNOSIS — Z932 Ileostomy status: Secondary | ICD-10-CM | POA: Diagnosis not present

## 2015-11-20 DIAGNOSIS — I447 Left bundle-branch block, unspecified: Secondary | ICD-10-CM | POA: Diagnosis present

## 2015-11-20 DIAGNOSIS — Z6835 Body mass index (BMI) 35.0-35.9, adult: Secondary | ICD-10-CM | POA: Diagnosis not present

## 2015-11-20 DIAGNOSIS — H409 Unspecified glaucoma: Secondary | ICD-10-CM | POA: Diagnosis present

## 2015-11-20 DIAGNOSIS — J069 Acute upper respiratory infection, unspecified: Secondary | ICD-10-CM | POA: Diagnosis present

## 2015-11-20 DIAGNOSIS — I10 Essential (primary) hypertension: Secondary | ICD-10-CM | POA: Diagnosis present

## 2015-11-20 LAB — URINALYSIS, ROUTINE W REFLEX MICROSCOPIC
Bilirubin Urine: NEGATIVE
Glucose, UA: NEGATIVE mg/dL
HGB URINE DIPSTICK: NEGATIVE
Ketones, ur: NEGATIVE mg/dL
Nitrite: NEGATIVE
PROTEIN: NEGATIVE mg/dL
Specific Gravity, Urine: 1.017 (ref 1.005–1.030)
pH: 5.5 (ref 5.0–8.0)

## 2015-11-20 LAB — CBC
HCT: 34.4 % — ABNORMAL LOW (ref 39.0–52.0)
Hemoglobin: 11 g/dL — ABNORMAL LOW (ref 13.0–17.0)
MCH: 29 pg (ref 26.0–34.0)
MCHC: 32 g/dL (ref 30.0–36.0)
MCV: 90.8 fL (ref 78.0–100.0)
PLATELETS: 295 10*3/uL (ref 150–400)
RBC: 3.79 MIL/uL — AB (ref 4.22–5.81)
RDW: 18.8 % — ABNORMAL HIGH (ref 11.5–15.5)
WBC: 5.4 10*3/uL (ref 4.0–10.5)

## 2015-11-20 LAB — COMPREHENSIVE METABOLIC PANEL
ALBUMIN: 2.5 g/dL — AB (ref 3.5–5.0)
ALT: 12 U/L — ABNORMAL LOW (ref 17–63)
AST: 21 U/L (ref 15–41)
Alkaline Phosphatase: 62 U/L (ref 38–126)
Anion gap: 7 (ref 5–15)
BILIRUBIN TOTAL: 0.6 mg/dL (ref 0.3–1.2)
BUN: 10 mg/dL (ref 6–20)
CHLORIDE: 108 mmol/L (ref 101–111)
CO2: 24 mmol/L (ref 22–32)
Calcium: 8.7 mg/dL — ABNORMAL LOW (ref 8.9–10.3)
Creatinine, Ser: 0.39 mg/dL — ABNORMAL LOW (ref 0.61–1.24)
GFR calc Af Amer: >60 mL/min (ref >60)
GFR calc non Af Amer: >60 mL/min (ref >60)
GLUCOSE: 101 mg/dL — AB (ref 65–99)
POTASSIUM: 3.6 mmol/L (ref 3.5–5.1)
SODIUM: 139 mmol/L (ref 135–145)
Total Protein: 5.7 g/dL — ABNORMAL LOW (ref 6.5–8.1)

## 2015-11-20 LAB — BLOOD GAS, VENOUS
ACID-BASE EXCESS: 1.8 mmol/L (ref 0.0–2.0)
Bicarbonate: 24.4 mEq/L — ABNORMAL HIGH (ref 20.0–24.0)
FIO2: 0.21
O2 Saturation: 89.4 %
PCO2 VEN: 35 mmHg — AB (ref 45.0–50.0)
Patient temperature: 101.3
TCO2: 21.9 mmol/L (ref 0–100)
pH, Ven: 7.464 — ABNORMAL HIGH (ref 7.250–7.300)
pO2, Ven: 63.8 mmHg — ABNORMAL HIGH (ref 30.0–45.0)

## 2015-11-20 LAB — PROTIME-INR
INR: 2.09 — AB (ref 0.00–1.49)
Prothrombin Time: 23.3 seconds — ABNORMAL HIGH (ref 11.6–15.2)

## 2015-11-20 LAB — INFLUENZA PANEL BY PCR (TYPE A & B)
H1N1FLUPCR: NOT DETECTED
INFLAPCR: NEGATIVE
INFLBPCR: NEGATIVE

## 2015-11-20 LAB — APTT: aPTT: 35 seconds (ref 24–37)

## 2015-11-20 LAB — PROCALCITONIN: Procalcitonin: 0.2 ng/mL

## 2015-11-20 LAB — LACTIC ACID, PLASMA
LACTIC ACID, VENOUS: 1.2 mmol/L (ref 0.5–2.0)
Lactic Acid, Venous: 0.9 mmol/L (ref 0.5–2.0)

## 2015-11-20 LAB — BRAIN NATRIURETIC PEPTIDE: B NATRIURETIC PEPTIDE 5: 453.8 pg/mL — AB (ref 0.0–100.0)

## 2015-11-20 LAB — CBG MONITORING, ED: Glucose-Capillary: 85 mg/dL (ref 65–99)

## 2015-11-20 LAB — URINE MICROSCOPIC-ADD ON
BACTERIA UA: NONE SEEN
RBC / HPF: NONE SEEN RBC/hpf (ref 0–5)
Squamous Epithelial / LPF: NONE SEEN

## 2015-11-20 MED ORDER — ACETAMINOPHEN 325 MG PO TABS: 650.0000 mg | ORAL_TABLET | Freq: Four times a day (QID) | ORAL | Status: DC | PRN

## 2015-11-20 MED ORDER — FUROSEMIDE 40 MG PO TABS
40.0000 mg | ORAL_TABLET | Freq: Every day | ORAL | Status: DC
  Administered 2015-11-21 – 2015-11-23 (×3): 40 mg via ORAL
  Filled 2015-11-20 (×4): qty 1

## 2015-11-20 MED ORDER — DM-GUAIFENESIN ER 30-600 MG PO TB12
1.0000 | ORAL_TABLET | Freq: Two times a day (BID) | ORAL | Status: DC
  Administered 2015-11-20 – 2015-11-23 (×8): 1 via ORAL
  Filled 2015-11-20 (×9): qty 1

## 2015-11-20 MED ORDER — SODIUM CHLORIDE 0.9 % IV SOLN
INTRAVENOUS | Status: DC
  Administered 2015-11-20: 03:00:00 via INTRAVENOUS

## 2015-11-20 MED ORDER — PRO-STAT SUGAR FREE PO LIQD
30.0000 mL | Freq: Three times a day (TID) | ORAL | Status: DC
  Filled 2015-11-20 (×7): qty 30

## 2015-11-20 MED ORDER — TAMSULOSIN HCL 0.4 MG PO CAPS
0.4000 mg | ORAL_CAPSULE | Freq: Every day | ORAL | Status: DC
  Administered 2015-11-20 – 2015-11-22 (×3): 0.4 mg via ORAL
  Filled 2015-11-20 (×3): qty 1

## 2015-11-20 MED ORDER — SODIUM CHLORIDE 0.9 % IJ SOLN
3.0000 mL | Freq: Two times a day (BID) | INTRAMUSCULAR | Status: DC
  Administered 2015-11-20 – 2015-11-23 (×4): 3 mL via INTRAVENOUS

## 2015-11-20 MED ORDER — VANCOMYCIN HCL IN DEXTROSE 1-5 GM/200ML-% IV SOLN
1000.0000 mg | Freq: Three times a day (TID) | INTRAVENOUS | Status: DC
  Administered 2015-11-20 – 2015-11-22 (×7): 1000 mg via INTRAVENOUS
  Filled 2015-11-20 (×7): qty 200

## 2015-11-20 MED ORDER — WARFARIN - PHARMACIST DOSING INPATIENT: Freq: Every day | Status: DC

## 2015-11-20 MED ORDER — DEXTROSE 5 % IV SOLN
2.0000 g | Freq: Three times a day (TID) | INTRAVENOUS | Status: DC
  Administered 2015-11-20 – 2015-11-22 (×7): 2 g via INTRAVENOUS
  Filled 2015-11-20 (×8): qty 2

## 2015-11-20 MED ORDER — BIOTENE DRY MOUTH MT LIQD
15.0000 mL | Freq: Three times a day (TID) | OROMUCOSAL | Status: DC
  Administered 2015-11-23: 15 mL via OROMUCOSAL

## 2015-11-20 MED ORDER — ADULT MULTIVITAMIN W/MINERALS CH
1.0000 | ORAL_TABLET | Freq: Every day | ORAL | Status: DC
  Administered 2015-11-20 – 2015-11-23 (×4): 1 via ORAL
  Filled 2015-11-20 (×4): qty 1

## 2015-11-20 MED ORDER — DEXTROSE 5 % IV SOLN
2.0000 g | Freq: Once | INTRAVENOUS | Status: AC
  Administered 2015-11-20: 2 g via INTRAVENOUS
  Filled 2015-11-20: qty 2

## 2015-11-20 MED ORDER — WARFARIN SODIUM 5 MG PO TABS
5.0000 mg | ORAL_TABLET | Freq: Every day | ORAL | Status: DC
  Administered 2015-11-20 – 2015-11-21 (×2): 5 mg via ORAL
  Filled 2015-11-20 (×3): qty 1

## 2015-11-20 MED ORDER — MAGNESIUM OXIDE 400 (241.3 MG) MG PO TABS
800.0000 mg | ORAL_TABLET | Freq: Two times a day (BID) | ORAL | Status: DC
  Administered 2015-11-20 – 2015-11-23 (×8): 800 mg via ORAL
  Filled 2015-11-20 (×9): qty 2

## 2015-11-20 MED ORDER — PANTOPRAZOLE SODIUM 40 MG PO TBEC
40.0000 mg | DELAYED_RELEASE_TABLET | Freq: Every day | ORAL | Status: DC
  Administered 2015-11-20 – 2015-11-23 (×4): 40 mg via ORAL
  Filled 2015-11-20 (×4): qty 1

## 2015-11-20 MED ORDER — ENSURE ENLIVE PO LIQD: 237.0000 mL | Freq: Two times a day (BID) | ORAL | Status: DC

## 2015-11-20 MED ORDER — HYDROCODONE-ACETAMINOPHEN 5-325 MG PO TABS: 1.0000 | ORAL_TABLET | Freq: Four times a day (QID) | ORAL | Status: DC | PRN

## 2015-11-20 MED ORDER — SUCRALFATE 1 GM/10ML PO SUSP
1.0000 g | Freq: Three times a day (TID) | ORAL | Status: DC
  Administered 2015-11-23 (×2): 1 g via ORAL
  Filled 2015-11-20 (×11): qty 10

## 2015-11-20 MED ORDER — VITAMIN C 500 MG PO TABS
500.0000 mg | ORAL_TABLET | Freq: Two times a day (BID) | ORAL | Status: DC
  Administered 2015-11-20 – 2015-11-23 (×8): 500 mg via ORAL
  Filled 2015-11-20 (×9): qty 1

## 2015-11-20 MED ORDER — DICLOFENAC SODIUM 1 % TD GEL
2.0000 g | Freq: Four times a day (QID) | TRANSDERMAL | Status: DC
  Administered 2015-11-20 – 2015-11-22 (×3): 2 g via TOPICAL
  Filled 2015-11-20: qty 100

## 2015-11-20 MED ORDER — ONDANSETRON HCL 4 MG/2ML IJ SOLN: 4.0000 mg | Freq: Three times a day (TID) | INTRAMUSCULAR | Status: DC | PRN

## 2015-11-20 MED ORDER — ALBUTEROL SULFATE (2.5 MG/3ML) 0.083% IN NEBU
2.5000 mg | INHALATION_SOLUTION | RESPIRATORY_TRACT | Status: DC | PRN
  Administered 2015-11-21 – 2015-11-22 (×4): 2.5 mg via RESPIRATORY_TRACT
  Filled 2015-11-20 (×4): qty 3

## 2015-11-20 MED ORDER — ACETAMINOPHEN 650 MG RE SUPP: 650.0000 mg | Freq: Four times a day (QID) | RECTAL | Status: DC | PRN

## 2015-11-20 MED ORDER — SODIUM CHLORIDE 0.9 % IV SOLN
INTRAVENOUS | Status: DC
  Administered 2015-11-20: 02:00:00 via INTRAVENOUS

## 2015-11-20 MED ORDER — FINASTERIDE 5 MG PO TABS
5.0000 mg | ORAL_TABLET | Freq: Every day | ORAL | Status: DC
  Administered 2015-11-20 – 2015-11-23 (×4): 5 mg via ORAL
  Filled 2015-11-20 (×4): qty 1

## 2015-11-20 MED ORDER — IOHEXOL 300 MG/ML  SOLN
100.0000 mL | Freq: Once | INTRAMUSCULAR | Status: AC | PRN
  Administered 2015-11-20: 100 mL via INTRAVENOUS

## 2015-11-20 MED ORDER — FAMOTIDINE 20 MG PO TABS
20.0000 mg | ORAL_TABLET | Freq: Two times a day (BID) | ORAL | Status: DC
  Administered 2015-11-20 – 2015-11-23 (×8): 20 mg via ORAL
  Filled 2015-11-20 (×8): qty 1

## 2015-11-20 MED ORDER — VANCOMYCIN HCL IN DEXTROSE 1-5 GM/200ML-% IV SOLN: 1000.0000 mg | Freq: Three times a day (TID) | INTRAVENOUS | Status: DC

## 2015-11-20 MED ORDER — IOHEXOL 300 MG/ML  SOLN
25.0000 mL | Freq: Once | INTRAMUSCULAR | Status: AC | PRN
  Administered 2015-11-20: 25 mL via ORAL

## 2015-11-20 NOTE — Progress Notes (Signed)
Pt has CPAP from home for use tonight.  RT inspected for frayed or damaged wires and none were present.  Machine is working properly at this time.  Pt prefers self placement and will administer when ready for bed.  RT to monitor and assess as needed.

## 2015-11-20 NOTE — Consult Note (Signed)
WOC wound consult note Reason for Consult:Sacral Pressure injury Patient consulted noted; he is still in ED/CT and am unable to see at this time.  Attempted x2 to see today.  Will see on Monday, 11/23/15.  I will contact floor staff via telephone later today if patient is admitted to see if WOC nursing team can assist admitting MD or Nursing team remotely with suggestions for interventions. Thanks, Ladona MowLaurie Pike Scantlebury, MSN, RN, GNP, Hans EdenCWOCN, CWON-AP, FAAN  Pager# [phone]

## 2015-11-20 NOTE — ED Notes (Signed)
Below order not completed by EW. 

## 2015-11-20 NOTE — Progress Notes (Addendum)
[facility] PROGRESS NOTE  Jonathan Le:[id number] DOB: [birth year] DOA: 11/19/2015 PCP: Elizabeth Palau, FNP  Brief narrative 70 year old male with history of coronary artery disease status post CABG, CHF with h EF of 25-30%, hypertension, DVT on Coumadin, OSA, glaucoma, multiple recent hospitalizations for recurrent intra-abdominal abscess with sepsis (last hospitalized 3 weeks back) requiring frequent CT drainage and on multiple antibiotics over the course who was discharged to be omental rehabilitation and was sent to the hospital with fever of 102F. Patient also reports cough with greenish phlegm for the past 6 days, associated with nasal congestion and body aches. Patient denied any abdominal pain, chest pain, shortness of breath, nausea, vomiting or urinary symptoms. Has good colostomy output and reports his abdominal drain to be working fine. In the ED he was found to be septic with fever of 101.1 F, tachycardic and tachypneic. Lactic acid, pro calcitonin and CBC were normal.. Renal function was unremarkable. Patient admitted with sepsis. Cultures sent on admission. CT abdomen and pelvis repeated showing no signs of abscess with small amount of fluid collection in the pelvic area and improvement in the size of the sacral decubitus ulcer.  Assessment/Plan: Sepsis This is most likely associated with acute viral illness. Chest x-ray does show residual opacity in the posterior lower lung possible for atelectasis versus residual pleural fluid. Abdominal CT without any new abscess which is reassuring. He has been placed on empiric vancomycin and cefepime which I will continue. Follow cultures. Flu PCR negative.  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 He had closure of the abdominal wall with wound VAC and ileostomy on  06/20/2015 by Dr. Donell Beers. Reportedly has discussed rectal stump leak on Gastrografin enema. Plan for repeat Gastrografin enema in 2-3 weeks as per prior discharge by surgery. -No new abscess seen on abdominal CT. Has an abdominal drain placed on prior hospitalization which seems to be functioning fine. Monitor drain output. -Patient supposed to follow-up with Dr. Kae Heller as outpatient. Rectal stump leak seen on Gastrografin enema. Will likely need abdominal drain for a very long time. Surgery had discussed with plastic surgeon during prior hospitalization and wanted his nutritional status to be improved before consideration for a flap. -I will obtain a Gastrografin enema for 1/9. (Discussed with Dr Abbey Chatters on the phone and agrees with this) -Call [place] [facility] as needed.    OSA: -Continue nighttime CPAP  CAD: s/p CABG x 3.  Stable.  Sacral decubitus ulcer-stage IV -Appears clean. CT abdomen pelvis repeated on admission showed improvement of the ulcer.  Chronic combined systolic and diastolic CHF (congestive heart failure) (HCC):  -Last EF of 25 and 30%. Has bilateral leg edema. Received IV fluids on admission which has been discontinued. I would resume his Lasix.  Anemia of chronic disease: Hemoglobin stable  BPH:  stable - Continue Flomax and Proscar     Hx of DVT: on coumadin. INR therapeutic. Dosing per pharmacy.  DVT prophylaxis: On coumadin   Code Status: Full code  Family Communication:wife at bed side  Disposition Plan: tele monitoring    Consultants:  None   Procedures:  CT abd and pelvis  Antibiotics:  vanco and cefepime 1/5--  Patient was discharged on 2 weeks of oral Keflex and Flagyl on 10/29/2015.  HPI/Subjective: Seen and examined. Denies abdominal pain. Reports having nonproductive cough and congestion.  Objective: Filed Vitals:   11/20/15 1345 11/20/15 1400  BP:  102/70 102/61  Pulse: 91 92  Temp:    Resp: 25 19     Intake/Output Summary (Last 24 hours) at 11/20/15 1406 Last data filed at 11/20/15 1209  Gross per 24 hour  Intake   1603 ml  Output    400 ml  Net   1203 ml   Filed Weights   11/19/15 2352  Weight: 108.863 kg (240 lb)    Exam:   General:  Elderly male not in distress  HEENT: No pallor, moist mucosa  Chest: Diminished bibasilar breath sounds  CVS: Normal S1-S2, no murmurs rub or gallop  GI: Ileostomy with output, intra-abdominal drain in place  Musculoskeletal: Warm, 1+ pitting edema bilaterally, sacral decubitus ulcer with some serosanguineous discharge no foul smell  CNS: Alert and oriented  Data Reviewed: Basic Metabolic Panel:  Recent Labs Lab 11/19/15 2307 11/20/15 0417  NA 138 139  K 3.7 3.6  CL 106 108  CO2 24 24  GLUCOSE 117* 101*  BUN 12 10  CREATININE 0.50* 0.39*  CALCIUM 8.8* 8.7*   Liver Function Tests:  Recent Labs Lab 11/19/15 2307 11/20/15 0417  AST 19 21  ALT 12* 12*  ALKPHOS 65 62  BILITOT 0.5 0.6  PROT 5.9* 5.7*  ALBUMIN 2.6* 2.5*   No results for input(s): LIPASE, AMYLASE in the last 168 hours. No results for input(s): AMMONIA in the last 168 hours. CBC:  Recent Labs Lab 11/19/15 2307 11/20/15 0417  WBC 6.9 5.4  NEUTROABS 5.5  --   HGB 11.1* 11.0*  HCT 34.8* 34.4*  MCV 88.5 90.8  PLT 308 295   Cardiac Enzymes: No results for input(s): CKTOTAL, CKMB, CKMBINDEX, TROPONINI in the last 168 hours. BNP (last 3 results)  Recent Labs  08/02/15 0620 08/16/15 0701 11/19/15 2325  BNP 367.7* 528.3* 453.8*    ProBNP (last 3 results) No results for input(s): PROBNP in the last 8760 hours.  CBG:  Recent Labs Lab 11/20/15 0807  GLUCAP 85    No results found for this or any previous visit (from the past 240 hour(s)).   Studies: Dg Chest 2 View  11/20/2015  CLINICAL DATA:  Surgery 5 weeks ago, abscesses, onset of fever 48 hours ago, cough and congestion, history coronary artery disease post CABG and MI, CHF,  hypertension EXAM: CHEST  2 VIEW COMPARISON:  08/23/2015 FINDINGS: Minimal enlargement of cardiac silhouette post CABG. Mediastinal contours normal. Minimal vascular congestion. Improved pulmonary infiltrates and basilar opacification versus previous exam. Mild residual opacity at the posterior lung bases on lateral view. This could represent atelectasis or loculated fluid. No definite acute infiltrate or pneumothorax. RIGHT glenohumeral degenerative changes. IMPRESSION: Enlargement of cardiac silhouette post CABG with minimal pulmonary vascular congestion. Residual opacity in the posterior lower lungs question atelectasis versus residual pleural fluid. Electronically Signed   By: [name] M.D.   On: 11/20/2015 00:01   Ct Abdomen Pelvis W Contrast  11/20/2015  CLINICAL DATA:  Abdominal abscess. Fever. History of multiple abscesses since colectomy August 2016. EXAM: CT ABDOMEN AND PELVIS WITH CONTRAST TECHNIQUE: Multidetector CT imaging of the abdomen and pelvis was performed using the standard protocol following bolus administration of intravenous contrast. CONTRAST:  100mL OMNIPAQUE IOHEXOL 300 MG/ML  SOLN COMPARISON:  Most recent abdominal CT 10/25/2015 FINDINGS: Lower chest: Dependent left lower lobe consolidation with small left pleural effusion. Dependent atelectasis in the right lower lobe. Calcified granuloma in the left lung. The heart is enlarged. Coronary artery calcifications are seen. Liver: No evidence focal  lesion or significant change from prior. Hepatobiliary: Gallbladder physiologically distended, no calcified stone. No biliary dilatation. Pancreas: No ductal dilatation.  No peripancreatic inflammation. Spleen: Upper limits of normal in size.  No focal abnormality. Adrenal glands: No nodule. Kidneys: Symmetric renal enhancement. No hydronephrosis. Nonobstructing calculi bilaterally. Stomach/Bowel: Stomach physiologically distended. There are no dilated or thickened small bowel loops. Right  lower quadrant ileostomy with small peristomal hernia. Post colectomy. The in situ sigmoid colon is fluid-filled, unchanged. Vascular/Lymphatic: Splenorenal shunt is noted. No retroperitoneal adenopathy. Abdominal aorta is normal in caliber. Moderate atherosclerosis. Reproductive: Heterogeneous enlarged prostate gland causing mass effect on the bladder base. Bladder: Physiologically distended. Other: Percutaneous drainage catheter with tip in the mid abdomen, unchanged from prior exam. Small extraluminal gas bubbles adjacent to the catheter, axial image 62. There is edema and soft tissue thickening in the region of the drainage catheter without dominant abscess. Edema is seen in the lower abdomen. No frank intra-abdominal ascites. Musculoskeletal: Sacral decubitus ulcer with thin air tract to the sacrum. This has diminished in size in the the interim. Stable degenerative change in the spine with scleroses at L2, L3 and L4. There are no acute or suspicious osseous abnormalities. IMPRESSION: 1. No fluid collection or recurrent abscess. Percutaneous catheter remains with small amount of extraluminal air adjacent to the catheter tip. This is likely related to catheter flushing. Small amount of fluid and edema in the lower abdomen. 2. Post colectomy with ileostomy, small parastomal hernia. 3. Decreased size of sacral decubitus ulcer. 4. Residual left lower lobe consolidation and pleural effusion. Improved aeration of the right lung base from prior CT. 5. Additional chronic findings are stable. Electronically Signed   By: Rubye Oaks M.D.   On: 11/20/2015 03:47    Scheduled Meds: . antiseptic oral rinse  15 mL Mouth Rinse TID PC  . dextromethorphan-guaiFENesin  1 tablet Oral BID  . diclofenac sodium  2 g Topical QID  . famotidine  20 mg Oral BID  . feeding supplement (PRO-STAT SUGAR FREE 64)  30 mL Oral TID  . finasteride  5 mg Oral Daily  . magnesium oxide  800 mg Oral BID  . multivitamin with minerals  1  tablet Oral Daily  . pantoprazole  40 mg Oral Daily  . sodium chloride  3 mL Intravenous Q12H  . sucralfate  1 g Oral TID WC & HS  . tamsulosin  0.4 mg Oral QPC supper  . ascorbic acid  500 mg Oral BID  . warfarin  5 mg Oral q1800  . Warfarin - Pharmacist Dosing Inpatient   Does not apply q1800   Continuous Infusions: . sodium chloride Stopped (11/20/15 0255)  . sodium chloride Stopped (11/20/15 0728)  . ceFEPime (MAXIPIME) IV Stopped (11/20/15 0728)  . vancomycin Stopped (11/20/15 1110)      Time spent: 20 minutes    Silvia Markuson  [facility] Pager [phone] If 7PM-7AM, please contact night-coverage at [email or web address], password [facility] 11/20/2015, 2:06 PM  LOS: 0 days

## 2015-11-20 NOTE — H&P (Addendum)
[facility] History and Physical  SAWYER MENTZER EAV:[id number] DOB: [birth year] DOA: 11/19/2015  Referring physician: ED physician PCP: Elizabeth Palau, FNP  Specialists:   Chief Complaint: cough and fever  HPI: Jonathan Le is a 70 y.o. male with PMH of CAD S/P CABG, sCHF with EF 25-30%, HTN, Hx DVT on Coumadin, OSA, and Glaucoma, complex recent surgical history (colectomy secondary to ischemic colitis and draining tube placement for abdominal at abscess), who was sent from the [facility] due to fevers and cough.   Patient report that he developed fever today with 102 temp tonight. He has cough with greenish colored sputum production, but no chest pain or shortness of breath. Patient also reports having sore throat, but no runny nose or body aching. He reports that he has severe sacral decubitus ulcer. He reports that his abdominal abscess draining tube is draining fine, but SNF reports that they have had difficulty flushing his abdominal drains today. He is followed by Gen. surgeon, Dr. Kae Heller every 3 weeks. He is currently taking Flagyl only per pt. He supposed to repeat CT-abd/pelvis on Wednesday, which has not been done yet in SNF. Patient does not have nausea, vomiting, diarrhea or abdominal pain. He states that he never had abdominal pain even when he had abdominal abscess before. Patient denies symptoms of UTI, hematuria, hematochezia, unilateral weakness. He has bilateral leg edema (right leg >left leg).   n ED, patient was found to have WBC 6.9, temperature 101.1, tachycardia, lactic acid 1.28, pro-calcitonin, electrolytes and renal function okay. CXR showed minimal pulmonary vascular congestion and residual opacity in the posterior lower lungs-->atelectasis vs. residual pleural fluid. Patient's and admitted to inpatient for further eval and treatment.  EKG: not done in ED, will get one  Where does patient live?   SNF  Can patient participate in ADLs?  None  Review of Systems:   General: has fevers, chills, has poor appetite, has fatigue HEENT: no blurry vision, hearing changes or sore throat Pulm: no dyspnea, has coughing, no wheezing CV: no chest pain, palpitations Abd: no nausea, vomiting, abdominal pain, diarrhea, constipation GU: no dysuria, burning on urination, increased urinary frequency, hematuria  Ext: has leg edema Neuro: no unilateral weakness, numbness, or tingling, no vision change or hearing loss Skin: no rash MSK: No muscle spasm, no deformity, no limitation of range of movement in spin Heme: No easy bruising.  Travel history: No recent long distant travel.  Allergy:  Allergies  Allergen Reactions  . Unasyn [Ampicillin-Sulbactam Sodium] Hives, Itching and Rash    Diffuse rash not responding to Pepcid and Benadryl.  TDD.  Marland Kitchen Levaquin [Levofloxacin] Nausea Only  . Penicillins Hives  . Doxycycline Rash    Past Medical History  Diagnosis Date  . Anxiety   . Hypertension   . Arthritis   . Sleep apnea     wears CPAP nightly  . Glaucoma   . CHF (congestive heart failure) (HCC)     MI, with open heart surgery x 30 sdays ago per pt. (06/26/15)  . S/P CABG (coronary artery bypass graft)   . MI (myocardial infarction) [facility])     Past Surgical History  Procedure Laterality Date  . Hernia repair      right  . Eye surgery      bil cataract  . Wisdom tooth extraction    . Fasciotomy foot / toe Left     foot  . Cardiac catheterization N/A 05/20/2015    Procedure: Left Heart Cath and  Coronary Angiography;  Surgeon: Kathleene Hazel, MD;  Location: [facility] INVASIVE CV LAB;  Service: Cardiovascular;  Laterality: N/A;  . Coronary artery bypass graft N/A 05/22/2015    Procedure: CORONARY ARTERY BYPASS GRAFTING (CABG), ON PUMP, TIMES THREE, USING LEFT INTERNAL MAMMARY ARTERY, RIGHT GREATER SAPHENOUS VEIN HARVESTED ENDOSCOPICALLY;  Surgeon: Kerin Perna, MD;  Location: [facility] OR;  Service: Open Heart Surgery;  Laterality:  N/A;  LIMA-LAD, SVG-OM, SVG-PD  . Tee without cardioversion N/A 05/22/2015    Procedure: TRANSESOPHAGEAL ECHOCARDIOGRAM (TEE);  Surgeon: Kerin Perna, MD;  Location: [facility] OR;  Service: Open Heart Surgery;  Laterality: N/A;  . Laparotomy N/A 06/18/2015    Procedure: EXPLORATORY LAPAROTOMY;  Surgeon: Emelia Loron, MD;  Location: [facility] OR;  Service: General;  Laterality: N/A;  . Laparotomy N/A 06/20/2015    Procedure: WASHOUT OF ABDOMEN, CLOSURE OF ABDOMINAL WALL WITH PLACEMENT OF WOUND VAC AND ILLEOSTOMY ;  Surgeon: Almond Lint, MD;  Location: [facility] OR;  Service: General;  Laterality: N/A;  . Tracheostomy tube placement N/A 07/03/2015    Procedure: TRACHEOSTOMY;  Surgeon: Serena Colonel, MD;  Location: [facility] OR;  Service: ENT;  Laterality: N/A;    Social History:  reports that he has never smoked. He has never used smokeless tobacco. He reports that he does not drink alcohol or use illicit drugs.  Family History:  Family History  Problem Relation Age of Onset  . Alzheimer's disease Father   . Rheum arthritis Mother      Prior to Admission medications   Medication Sig Start Date End Date Taking? Authorizing Provider  acetaminophen (TYLENOL) 325 MG tablet Take 2 tablets (650 mg total) by mouth 2 (two) times daily with breakfast and lunch. 09/24/15  Yes Evlyn Kanner Love, PA-C  antiseptic oral rinse (BIOTENE) LIQD 15 mLs by Mouth Rinse route 3 (three) times daily after meals. 09/24/15  Yes Evlyn Kanner Love, PA-C  ascorbic acid (VITAMIN C) 500 MG tablet Take 500 mg by mouth 2 (two) times daily.   Yes Historical Provider, MD  diclofenac sodium (VOLTAREN) 1 % GEL Apply 2 g topically 4 (four) times daily. 09/24/15  Yes Evlyn Kanner Love, PA-C  famotidine (PEPCID) 20 MG tablet Take 1 tablet (20 mg total) by mouth 2 (two) times daily. 09/24/15  Yes Evlyn Kanner Love, PA-C  finasteride (PROSCAR) 5 MG tablet Take 1 tablet (5 mg total) by mouth daily. 09/24/15  Yes Evlyn Kanner Love, PA-C  furosemide (LASIX) 40 MG tablet Take 1  tablet (40 mg total) by mouth daily. 09/24/15  Yes Evlyn Kanner Love, PA-C  magnesium oxide (MAG-OX) 400 (241.3 MG) MG tablet Take 2 tablets (800 mg total) by mouth 2 (two) times daily. 09/24/15  Yes Evlyn Kanner Love, PA-C  Multiple Vitamins-Minerals (DECUBI-VITE PO) Take 1 tablet by mouth daily.   Yes Historical Provider, MD  ondansetron (ZOFRAN) 4 MG tablet Take 1 tablet (4 mg total) by mouth every 6 (six) hours as needed for nausea. 09/24/15  Yes Evlyn Kanner Love, PA-C  potassium chloride (K-DUR,KLOR-CON) 10 MEQ tablet Take 30 mEq by mouth 2 (two) times daily.   Yes Historical Provider, MD  sucralfate (CARAFATE) 1 GM/10ML suspension Take 1 g by mouth 4 (four) times daily -  with meals and at bedtime.   Yes Historical Provider, MD  tamsulosin (FLOMAX) 0.4 MG CAPS capsule Take 1 capsule (0.4 mg total) by mouth daily after supper. 09/24/15  Yes Evlyn Kanner Love, PA-C  warfarin (COUMADIN) 5 MG tablet Take 1 tablet (5  mg total) by mouth daily at 6 PM. 09/24/15  Yes Evlyn KannerPamela S Love, PA-C  Amino Acids-Protein Hydrolys (FEEDING SUPPLEMENT, PRO-STAT SUGAR FREE 64,) LIQD Take 30 mLs by mouth 3 (three) times daily. 09/24/15   Jacquelynn CreePamela S Love, PA-C  cephALEXin (KEFLEX) 500 MG capsule Take 1 capsule (500 mg total) by mouth every 6 (six) hours. For 2 weeks 10/29/15   Zannie CovePreetha Joseph, MD  HYDROcodone-acetaminophen (NORCO/VICODIN) 5-325 MG tablet Take 1 tablet by mouth every 6 (six) hours as needed for moderate pain. 10/30/15   Zannie CovePreetha Joseph, MD  metroNIDAZOLE (FLAGYL) 500 MG tablet Take 1 tablet (500 mg total) by mouth every 8 (eight) hours. 10/29/15   Zannie CovePreetha Joseph, MD  pantoprazole (PROTONIX) 40 MG tablet Take 40 mg by mouth daily.    Historical Provider, MD  predniSONE (DELTASONE) 20 MG tablet Take 1 tablet (20 mg total) by mouth daily with breakfast. For 1 day then 10mg  for 2days then STOP 10/30/15   Zannie CovePreetha Joseph, MD    Physical Exam: Filed Vitals:   11/20/15 0100 11/20/15 0115 11/20/15 0130 11/20/15 0145  BP: 99/56  112/61 105/62 96/57  Pulse: 97 101 99 91  Temp:      TempSrc:      Resp: 19 26 23 23   Height:      Weight:      SpO2: 97% 96% 94% 93%   General: Not in acute distress HEENT:       Eyes: PERRL, EOMI, no scleral icterus.       ENT: No discharge from the ears and nose, no pharynx injection, no tonsillar enlargement.        Neck: No JVD, no bruit, no mass felt. Heme: No neck lymph node enlargement. Cardiac: S1/S2, RRR, No murmurs, No gallops or rubs. Pulm:  No rales, wheezing, rhonchi or rubs. Abd: Soft, nondistended, nontender, no rebound pain, no organomegaly, BS present. Has colostomy and draining tube in abdomen. Ext: has asymmetric pitting leg edema bilaterally (3+ on the right and 1+ on the left leg). 2+DP/PT pulse bilaterally. Musculoskeletal: No joint deformities, No joint redness or warmth, no limitation of ROM in spin. Skin: has stage IV sacral decubitus ulcer.  Neuro: Alert, oriented X3, cranial nerves II-XII grossly intact, muscle strength 5/5 in all extremities, sensation to light touch intact.  Psych: Patient is not psychotic, no suicidal or hemocidal ideation.  Labs on Admission:  Basic Metabolic Panel:  Recent Labs Lab 11/19/15 2307  NA 138  K 3.7  CL 106  CO2 24  GLUCOSE 117*  BUN 12  CREATININE 0.50*  CALCIUM 8.8*   Liver Function Tests:  Recent Labs Lab 11/19/15 2307  AST 19  ALT 12*  ALKPHOS 65  BILITOT 0.5  PROT 5.9*  ALBUMIN 2.6*   No results for input(s): LIPASE, AMYLASE in the last 168 hours. No results for input(s): AMMONIA in the last 168 hours. CBC:  Recent Labs Lab 11/19/15 2307  WBC 6.9  NEUTROABS 5.5  HGB 11.1*  HCT 34.8*  MCV 88.5  PLT 308   Cardiac Enzymes: No results for input(s): CKTOTAL, CKMB, CKMBINDEX, TROPONINI in the last 168 hours.  BNP (last 3 results)  Recent Labs  08/02/15 0620 08/16/15 0701 11/19/15 2325  BNP 367.7* 528.3* 453.8*    ProBNP (last 3 results) No results for input(s): PROBNP in the last  8760 hours.  CBG: No results for input(s): GLUCAP in the last 168 hours.  Radiological Exams on Admission: Dg Chest 2 View  11/20/2015  CLINICAL  DATA:  Surgery 5 weeks ago, abscesses, onset of fever 48 hours ago, cough and congestion, history coronary artery disease post CABG and MI, CHF, hypertension EXAM: CHEST  2 VIEW COMPARISON:  08/23/2015 FINDINGS: Minimal enlargement of cardiac silhouette post CABG. Mediastinal contours normal. Minimal vascular congestion. Improved pulmonary infiltrates and basilar opacification versus previous exam. Mild residual opacity at the posterior lung bases on lateral view. This could represent atelectasis or loculated fluid. No definite acute infiltrate or pneumothorax. RIGHT glenohumeral degenerative changes. IMPRESSION: Enlargement of cardiac silhouette post CABG with minimal pulmonary vascular congestion. Residual opacity in the posterior lower lungs question atelectasis versus residual pleural fluid. Electronically Signed   By: Ulyses Southward M.D.   On: 11/20/2015 00:01    Assessment/Plan Principal Problem:   Sepsis (HCC) Active Problems:   Benign prostatic hyperplasia with urinary obstruction   Essential hypertension   Morbid obesity (HCC)   OSA on CPAP   S/P CABG x 3   Abdominal abscess (HCC)   Sacral decubitus ulcer   Chronic combined systolic and diastolic congestive heart failure (HCC)   Anemia of chronic disease   Fever   Cough  Sepsis [facility]): Patient is septic with fever and tachycardia. Lactate is 1.28. Blood pressure is soft, but hemodynamically stable. Potential sources of infection include worsening abdominal abscess, upper respiratory infection, decubitus ulcer infection.  -will admit to SDU -EDP started pt with IV vancomycin and cefepime, will continue. -will trend lactic acid levels per sepsis protocol. -IVF: 1.6L of NS bolus in ED, followed by 75 cc/h (patient has a congestive heart failure with EF 25%, limiting aggressive IV fluids  treatment). -Blood culture x 2  Hx of abdominal abscess: s/p of tube draining. Never had AP even when he had abscess before. Followed by dr. Kae Heller every 3 weeks. -repeat CT-abd/pelvis -On IV antibiotics as above  Cough: Patient does not seem to have infiltration on chest x-ray. Possibly due to URI viral infection and bronchitis. - On IV antibiotics as above -When necessary Albuterol nebulizers  - Mucinex for cough - flu PCR and sputum culture  BPH: stable - Continue Flomax and Proscar  HTN: -Hold lasix due to sepsis -On flomax which is for BPH  OSA: -CPAP  CAD: s/p CABG x 3. No chest pain -f/u EKG  Sacral decubitus ulcer-stage IV -Consult wound care  Chronic combined systolic and diastolic CHF (congestive heart failure) (HCC): 2-D echo on 06/18/15 showed EF 25-30% with grade 1 diastolic dysfunction. He is on lasix. BNP=435.8. Has leg edema. Pt is slightly fluid-overloaded. Due to sepsis, need to hold lasix -hold lasix  - monitor volume status and her respiratory function closely -Restarted Lasix as well as a sepsis is under control.  Anemia of chronic disease: Hgb stable, 11.1 on admission. -f/u by CBC  Hx of DVT: on coumadin. INR pending. I communicated with on-call PA, Craige Cotta who will follow up INR. If subtherapeutic, we needed to start IV heparin for bridging -Continue coumadin per pharmacy.  DVT ppx: On coumadin with pending INR Code Status: Full code Family Communication:  Yes, patient's   wife at bed side Disposition Plan: Admit to inpatient   Date of Service 11/20/2015    Lorretta Harp [facility] Pager [phone]  If 7PM-7AM, please contact night-coverage [email or web address] Password [email or web address] 11/20/2015, 2:26 AM

## 2015-11-20 NOTE — Progress Notes (Signed)
ANTIBIOTIC CONSULT NOTE - INITIAL  Pharmacy Consult for Vancomycin, cefepime  Indication: sepsis  Allergies  Allergen Reactions  . Unasyn [Ampicillin-Sulbactam Sodium] Hives, Itching and Rash    Diffuse rash not responding to Pepcid and Benadryl.  TDD.  Marland Kitchen. Levaquin [Levofloxacin] Nausea Only  . Penicillins Hives    MAR  . Doxycycline Rash    Patient Measurements: Height: 5\' 9"  (175.3 cm) Weight: 240 lb (108.863 kg) IBW/kg (Calculated) : 70.7 Adjusted Body Weight:   Vital Signs: Temp: 101.1 F (38.4 C) (01/05 2330) Temp Source: Rectal (01/05 2330) BP: 99/59 mmHg (01/06 0300) Pulse Rate: 89 (01/06 0300) Intake/Output from previous day: 01/05 0701 - 01/06 0700 In: 1603 [I.V.:1603] Out: -  Intake/Output from this shift: Total I/O In: 1603 [I.V.:1603] Out: -   Labs:  Recent Labs  11/19/15 2307 11/20/15 0417  WBC 6.9 5.4  HGB 11.1* 11.0*  PLT 308 295  CREATININE 0.50* 0.39*   Estimated Creatinine Clearance: 104.5 mL/min (by C-G formula based on Cr of 0.39). No results for input(s): VANCOTROUGH, VANCOPEAK, VANCORANDOM, GENTTROUGH, GENTPEAK, GENTRANDOM, TOBRATROUGH, TOBRAPEAK, TOBRARND, AMIKACINPEAK, AMIKACINTROU, AMIKACIN in the last 72 hours.   Microbiology: No results found for this or any previous visit (from the past 720 hour(s)).  Medical History: Past Medical History  Diagnosis Date  . Anxiety   . Hypertension   . Arthritis   . Sleep apnea     wears CPAP nightly  . Glaucoma   . CHF (congestive heart failure) (HCC)     MI, with open heart surgery x 30 sdays ago per pt. (06/26/15)  . S/P CABG (coronary artery bypass graft)   . MI (myocardial infarction) (HCC)     Medications:  Anti-infectives    Start     Dose/Rate Route Frequency Ordered Stop   11/20/15 0800  vancomycin (VANCOCIN) IVPB 1000 mg/200 mL premix  Status:  Discontinued     1,000 mg 200 mL/hr over 60 Minutes Intravenous Every 8 hours 11/20/15 0627 11/20/15 0628   11/20/15 0800   vancomycin (VANCOCIN) IVPB 1000 mg/200 mL premix     1,000 mg 200 mL/hr over 60 Minutes Intravenous Every 8 hours 11/20/15 0631     11/20/15 0730  ceFEPIme (MAXIPIME) 2 g in dextrose 5 % 50 mL IVPB     2 g 100 mL/hr over 30 Minutes Intravenous 3 times per day 11/20/15 0627     11/20/15 0015  ceFEPIme (MAXIPIME) 2 g in dextrose 5 % 50 mL IVPB     2 g 100 mL/hr over 30 Minutes Intravenous  Once 11/20/15 0003 11/20/15 0123   11/19/15 2345  vancomycin (VANCOCIN) IVPB 1000 mg/200 mL premix     1,000 mg 200 mL/hr over 60 Minutes Intravenous  Once 11/19/15 2332 11/20/15 0052     Assessment: Patient with sepsis, r/o ulcer infection, upper respiratory infection or abdominal abscess.  First dose of antibiotics already given.  Goal of Therapy:  Vancomycin trough level 15-20 mcg/ml  Cefepime dosed based on patient weight and renal function   Plan:  Measure antibiotic drug levels at steady state Follow up culture results Vancomycin 1gm iv q8hr  Cefepime 2gm iv q8hr  Darlina GuysGrimsley Jr, Yeshua Stryker Crowford 11/20/2015,6:32 AM

## 2015-11-20 NOTE — Progress Notes (Signed)
PT Cancellation Note  Patient Details Name: Jonathan Le MRN: [id number] DOB: 10/21/1946   Cancelled Treatment:    Reason Eval/Treat Not Completed: Other (comment) Pt currently in ED, appears pt will be admitted to inpatient bed.  Per previous admission notes in Dec 2016, pt +3 assist for mobility.  Will await inpatient bed.   Raquel Racey,KATHrine E 11/20/2015, 11:55 AM Zenovia JarredKati Gay Moncivais, PT, DPT 11/20/2015 Pager: [phone]

## 2015-11-20 NOTE — Progress Notes (Signed)
ANTICOAGULATION CONSULT NOTE - Initial Consult  Pharmacy Consult for warfarin Indication: DVT  Allergies  Allergen Reactions  . Unasyn [Ampicillin-Sulbactam Sodium] Hives, Itching and Rash    Diffuse rash not responding to Pepcid and Benadryl.  TDD.  Marland Kitchen. Levaquin [Levofloxacin] Nausea Only  . Penicillins Hives    MAR  . Doxycycline Rash    Patient Measurements: Height: 5\' 9"  (175.3 cm) Weight: 240 lb (108.863 kg) IBW/kg (Calculated) : 70.7 Heparin Dosing Weight:   Vital Signs: Temp: 101.1 F (38.4 C) (01/05 2330) Temp Source: Rectal (01/05 2330) BP: 99/59 mmHg (01/06 0300) Pulse Rate: 89 (01/06 0300)  Labs:  Recent Labs  11/19/15 2307 11/20/15 0153 11/20/15 0417  HGB 11.1*  --  11.0*  HCT 34.8*  --  34.4*  PLT 308  --  295  APTT  --  35  --   LABPROT  --  23.3*  --   INR  --  2.09*  --   CREATININE 0.50*  --  0.39*    Estimated Creatinine Clearance: 104.5 mL/min (by C-G formula based on Cr of 0.39).   Medical History: Past Medical History  Diagnosis Date  . Anxiety   . Hypertension   . Arthritis   . Sleep apnea     wears CPAP nightly  . Glaucoma   . CHF (congestive heart failure) (HCC)     MI, with open heart surgery x 30 sdays ago per pt. (06/26/15)  . S/P CABG (coronary artery bypass graft)   . MI (myocardial infarction) (HCC)     Medications:   (Not in a hospital admission) Scheduled:  . antiseptic oral rinse  15 mL Mouth Rinse TID PC  . dextromethorphan-guaiFENesin  1 tablet Oral BID  . diclofenac sodium  2 g Topical QID  . famotidine  20 mg Oral BID  . feeding supplement (PRO-STAT SUGAR FREE 64)  30 mL Oral TID  . finasteride  5 mg Oral Daily  . magnesium oxide  800 mg Oral BID  . multivitamin with minerals  1 tablet Oral Daily  . pantoprazole  40 mg Oral Daily  . sodium chloride  3 mL Intravenous Q12H  . sucralfate  1 g Oral TID WC & HS  . tamsulosin  0.4 mg Oral QPC supper  . ascorbic acid  500 mg Oral BID  . warfarin  5 mg Oral q1800   . Warfarin - Pharmacist Dosing Inpatient   Does not apply q1800    Assessment: Patient with chronic warfarin for hx of DVT.  INR at goal on admit.  Last dose of warfarin noted to be 11/19/15  Goal of Therapy:  INR 2-3    Plan:  Warfarin 5mg  po daily Daily INR  Darlina GuysGrimsley Jr, Jacquenette ShoneJulian Crowford 11/20/2015,6:34 AM

## 2015-11-21 DIAGNOSIS — D638 Anemia in other chronic diseases classified elsewhere: Secondary | ICD-10-CM

## 2015-11-21 LAB — URINE CULTURE: CULTURE: NO GROWTH

## 2015-11-21 LAB — PROTIME-INR
INR: 1.98 — AB (ref 0.00–1.49)
Prothrombin Time: 22.4 seconds — ABNORMAL HIGH (ref 11.6–15.2)

## 2015-11-21 LAB — GLUCOSE, CAPILLARY: GLUCOSE-CAPILLARY: 90 mg/dL (ref 65–99)

## 2015-11-21 MED ORDER — POTASSIUM CHLORIDE CRYS ER 20 MEQ PO TBCR
30.0000 meq | EXTENDED_RELEASE_TABLET | Freq: Every day | ORAL | Status: DC
  Administered 2015-11-21 – 2015-11-23 (×3): 30 meq via ORAL
  Filled 2015-11-21 (×3): qty 1

## 2015-11-21 NOTE — Progress Notes (Signed)
OT Cancellation Note  Patient Details Name: Jonathan Le MRN: [id number] DOB: 05/19/1946   Cancelled Treatment:    Reason Eval/Treat Not Completed: Other (comment).  Pt is from SNF with plan to return to SNF. Will defer OT to that venue.  Tyshia Fenter 11/21/2015, 4:07 PM  Marica OtterMaryellen Ayvin Lipinski, OTR/L [phone] 11/21/2015

## 2015-11-21 NOTE — Progress Notes (Signed)
ANTICOAGULATION CONSULT NOTE - Follow Up Consult  Pharmacy Consult for Warfarin Indication: VTE prophylaxis  Allergies  Allergen Reactions  . Unasyn [Ampicillin-Sulbactam Sodium] Hives, Itching and Rash    Diffuse rash not responding to Pepcid and Benadryl.  TDD.  Marland Kitchen. Levaquin [Levofloxacin] Nausea Only  . Penicillins Hives    MAR  . Doxycycline Rash    Patient Measurements: Height: 5\' 9"  (175.3 cm) Weight: 243 lb 2.7 oz (110.3 kg) IBW/kg (Calculated) : 70.7  Vital Signs: Temp: 97.9 F (36.6 C) (01/07 1041) Temp Source: Oral (01/07 1041) BP: 103/57 mmHg (01/07 1041) Pulse Rate: 88 (01/07 1041)  Labs:  Recent Labs  11/19/15 2307 11/20/15 0153 11/20/15 0417 11/21/15 0542  HGB 11.1*  --  11.0*  --   HCT 34.8*  --  34.4*  --   PLT 308  --  295  --   APTT  --  35  --   --   LABPROT  --  23.3*  --  22.4*  INR  --  2.09*  --  1.98*  CREATININE 0.50*  --  0.39*  --     Estimated Creatinine Clearance: 105.1 mL/min (by C-G formula based on Cr of 0.39).   Medications:  Prescriptions prior to admission  Medication Sig Dispense Refill Last Dose  . acetaminophen (TYLENOL) 325 MG tablet Take 2 tablets (650 mg total) by mouth 2 (two) times daily with breakfast and lunch.   unknown at unknown  . Amino Acids-Protein Hydrolys (FEEDING SUPPLEMENT, PRO-STAT SUGAR FREE 64,) LIQD Take 30 mLs by mouth 3 (three) times daily. 900 mL 0 11/18/2015 at 2100  . diclofenac sodium (VOLTAREN) 1 % GEL Apply 2 g topically 4 (four) times daily.   11/19/2015 at 2000  . famotidine (PEPCID) 20 MG tablet Take 1 tablet (20 mg total) by mouth 2 (two) times daily.   11/19/2015 at 1600  . finasteride (PROSCAR) 5 MG tablet Take 1 tablet (5 mg total) by mouth daily.   11/19/2015 at 0800  . furosemide (LASIX) 40 MG tablet Take 1 tablet (40 mg total) by mouth daily. 30 tablet  11/18/2015 at 1200  . HYDROcodone-acetaminophen (NORCO/VICODIN) 5-325 MG tablet Take 1 tablet by mouth every 6 (six) hours as needed for moderate  pain. 30 tablet 0 unknown at unknown  . magnesium oxide (MAG-OX) 400 (241.3 MG) MG tablet Take 2 tablets (800 mg total) by mouth 2 (two) times daily.   11/19/2015 at 1600  . metroNIDAZOLE (FLAGYL) 500 MG tablet Take 1 tablet (500 mg total) by mouth every 8 (eight) hours. 42 tablet 0 11/19/2015 at 2200  . Multiple Vitamins-Minerals (DECUBI-VITE PO) Take 1 tablet by mouth daily.   11/19/2015 at 0800  . nutrition supplement, JUVEN, (JUVEN) PACK Take 1 packet by mouth daily.   11/19/2015 at 0800  . pantoprazole (PROTONIX) 40 MG tablet Take 40 mg by mouth daily.   11/19/2015 at 0600  . potassium chloride (K-DUR,KLOR-CON) 10 MEQ tablet Take 30 mEq by mouth 2 (two) times daily.   11/19/2015 at 1800  . tamsulosin (FLOMAX) 0.4 MG CAPS capsule Take 1 capsule (0.4 mg total) by mouth daily after supper. 30 capsule  11/19/2015 at 1800  . warfarin (COUMADIN) 5 MG tablet Take 1 tablet (5 mg total) by mouth daily at 6 PM.   11/19/2015 at 1800   Scheduled:  . antiseptic oral rinse  15 mL Mouth Rinse TID PC  . ceFEPime (MAXIPIME) IV  2 g Intravenous 3 times per day  .  dextromethorphan-guaiFENesin  1 tablet Oral BID  . diclofenac sodium  2 g Topical QID  . famotidine  20 mg Oral BID  . feeding supplement (ENSURE ENLIVE)  237 mL Oral BID BM  . feeding supplement (PRO-STAT SUGAR FREE 64)  30 mL Oral TID  . finasteride  5 mg Oral Daily  . furosemide  40 mg Oral Daily  . magnesium oxide  800 mg Oral BID  . multivitamin with minerals  1 tablet Oral Daily  . pantoprazole  40 mg Oral Daily  . potassium chloride  30 mEq Oral Daily  . sodium chloride  3 mL Intravenous Q12H  . sucralfate  1 g Oral TID WC & HS  . tamsulosin  0.4 mg Oral QPC supper  . vancomycin  1,000 mg Intravenous Q8H  . ascorbic acid  500 mg Oral BID  . warfarin  5 mg Oral q1800  . Warfarin - Pharmacist Dosing Inpatient   Does not apply q1800    Assessment: 72 yoM with Hx CAD s/p CABG, HFrEF, HTN, DVT on warfarin at home, admitted 1/5 for sepsis likely d/t  viral illness.  To continue warfarin per pharmacy while admitted   Baseline INR therapeutic, aPTT wnl  Prior anticoagulation:   Significant events:  Today, 11/21/2015:  No new CBC.  Previously with slightly low Hgb and Plt wnl  INR slightly subtherapeutic  Major drug interactions: broad spectrum abx  No bleeding issues per nursing  Regular diet ordered  Goal of Therapy: INR 2-3  Plan:  Continue warfarin 5 mg PO daily; expect INR may bump slightly d/t acute illness.  Will increase dose tomorrow if still subtherapeutic  Daily INR  CBC at least q72 hr while on warfarin  Monitor for signs of bleeding or thrombosis   Bernadene Person, PharmD Pager: [phone] 11/21/2015, 12:49 PM

## 2015-11-21 NOTE — Consult Note (Addendum)
WOC wound consult note Reason for Consult:Sacral Stage 4 Pressure Injury (healing).  Known by our department from several visits in late 2016. Wound type: Pressure Pressure Ulcer POA: Yes Measurement: Per bedside RN (wound dressings have just been changed) Wound bed: Per bedside RN, red, moist Drainage (amount, consistency, odor) serous Periwound:intact, dry Dressing procedure/placement/frequency:I will continue the POC put in place by CCS and continued at this time by [facility] continually moist dressings changed twice daily and as needed for drainage strike-through onto exterior dressing.  Patient prefers to be in the supine position with HOB elevated and he is again taught that turning side to side is crucial to his wound healing.  The right heel is noted today to be boggy, intact and red, but blanchable and he is asked if he has been wearing the pressure redistribution boots provided during a previous admission.  He reports that he has not and replacement boots are provided today.  Additionally, I will provide a pressure redistribution mattress for use while in house.   WOC ostomy consult note Stoma type/location: RLQ ileostomy Stomal assessment/size: Slightly oval, 1 and 1/4 inch Peristomal assessment: Not seen today, pouching system intact Treatment options for stomal/peristomal skin: Skin barrier ring Output Brown Ostomy pouching: 2pc. 2 and 1/4 inch with skin barrier rin  WOC nursing team will not follow, but will remain available to this patient, the nursing and medical teams.  Please re-consult if needed. Thanks, Ladona MowLaurie Dmari Schubring, MSN, RN, GNP, Hans EdenCWOCN, CWON-AP, FAAN  Pager# [phone]

## 2015-11-21 NOTE — Progress Notes (Signed)
PT Cancellation Note  Patient Details Name: Jonathan Le MRN: [id number] DOB: 02/22/1946   Cancelled Treatment:    Reason Eval/Treat Not Completed: Fatigue/lethargy limiting ability to participate. Pt requested PT check back another day. It could possibly be Monday before PT gets back around. Thanks. `   Rebeca AlertJannie Marlie Kuennen, MPT Pager: [phone]

## 2015-11-21 NOTE — Progress Notes (Signed)
Patient refused to wear bilateral Prevalon boots. RN educated patient that he currently has unstageable pressure ulcers to bilateral heels, and this will only get worse without relieving the pressure. Patient old RN, "I'm allergic to those boots, you can look it up in my records. I've thrown away 2 brand new pairs already. Don't even bring those boots in this room- I don't want to see them and I don't want to get charged for them again. RN offered to place a pillow case between patient's skin & boot, or to apply lotion to BLE, but patient refused. Bilateral allevyn dressings were applied to both heels. Heels were also elevated on pillow.

## 2015-11-21 NOTE — Progress Notes (Addendum)
PROGRESS NOTE  Jonathan Le ZOX:[id number] DOB: 06/02/1946 DOA: 11/19/2015 PCP: Elizabeth PalauANDERSON,TERESA, FNP  Brief narrative 70 year old male with history of coronary artery disease status post CABG, CHF with h EF of 25-30%, hypertension, DVT on Coumadin, OSA, glaucoma, multiple recent hospitalizations for recurrent intra-abdominal abscess with sepsis (last hospitalized 3 weeks back) requiring frequent CT drainage and on multiple antibiotics over the course who was discharged to be omental rehabilitation and was sent to the hospital with fever of 102F. Patient also reports cough with greenish phlegm for the past 6 days, associated with nasal congestion and body aches. Patient denied any abdominal pain, chest pain, shortness of breath, nausea, vomiting or urinary symptoms. Has good colostomy output and reports his abdominal drain to be working fine. In the ED he was found to be septic with fever of 101.1 F, tachycardic and tachypneic. Lactic acid, pro calcitonin and CBC were normal.. Renal function was unremarkable. Patient admitted with sepsis. Cultures sent on admission. CT abdomen and pelvis repeated showing no signs of abscess with small amount of fluid collection in the pelvic area and improvement in the size of the sacral decubitus ulcer.  HPI/Subjective: Seen and examined. Denies abdominal pain. Reports having nonproductive cough and congestion.  Assessment/Plan: Sepsis - This is most likely associated with acute viral illness. Chest x-ray does show residual opacity in the posterior lower lung possible for atelectasis versus residual pleural fluid. Abdominal CT without any new abscess which is reassuring. - He has been placed on empiric vancomycin and cefepime which I will continue. Follow cultures. Flu PCR negative. 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 He had closure of the abdominal wall with wound VAC and ileostomy on 06/20/2015 by Dr. Donell BeersByerly. Reportedly has discussed rectal stump leak on Gastrografin enema. Plan for repeat Gastrografin enema in 2-3 weeks as per prior discharge by surgery. - No new abscess seen on abdominal CT. Has an abdominal drain placed on prior hospitalization which seems to be functioning fine. Monitor drain output. - Patient supposed to follow-up with Dr. Kae Hellerosenbaum as outpatient. Rectal stump leak seen on Gastrografin enema. Will likely need abdominal drain for a very long time. Surgery had discussed with plastic surgeon during prior hospitalization and wanted his nutritional status to be improved before consideration for a flap. - I will obtain a Gastrografin enema for 1/9. (Dr. Gonzella Lexhungel discussed with with Dr Abbey Chattersosenbower on the phone and agrees with this) - Call [place] surgery as needed.  OSA: - Continue nighttime CPAP  CAD:  - s/p CABG x 3.  - Stable, no chest pain  Sacral decubitus ulcer-stage IV - Appears clean. CT abdomen pelvis repeated on admission showed improvement of the ulcer. - wound care consulted, patient known to them, appears to be healing from their standpoint  Chronic combined systolic and diastolic CHF (congestive heart failure) (HCC):  - Last EF of 25 and 30%. Has bilateral leg edema. Received IV fluids on admission which has been discontinued. I - resume Lasix/potassium  Anemia of chronic disease: - Hemoglobin stable  BPH:  - stable - Continue Flomax and Proscar  Hx of DVT - on coumadin. Dosing per pharmacy.   DVT prophylaxis: On coumadin  Code Status: Full code Family Communication:no family at bedside Disposition Plan: tele monitoring, SNF when ready   Consultants:  None   Procedures:  CT abd and pelvis  Antibiotics:  vanco and cefepime  1/5  Patient was discharged on 2 weeks of oral Keflex and Flagyl on 10/29/2015.  Objective: Filed Vitals:   11/21/15 0004 11/21/15 0613  BP: 110/58 107/58  Pulse: 103 91  Temp: 98.8 F (37.1 C) 98.7 F (37.1 C)  Resp: 20 20    Intake/Output Summary (Last 24 hours) at 11/21/15 0706 Last data filed at 11/21/15 4098  Gross per 24 hour  Intake    550 ml  Output   1500 ml  Net   -950 ml   Filed Weights   11/19/15 2352 11/20/15 1431 11/21/15 0613  Weight: 108.863 kg (240 lb) 110 kg (242 lb 8.1 oz) 110.3 kg (243 lb 2.7 oz)   Exam:  General:  Elderly male not in distress  HEENT: No pallor, moist mucosa  Chest: Diminished bibasilar breath sounds  CVS: Normal S1-S2, no murmurs rub or gallop  GI: Ileostomy with output, intra-abdominal drain in place  Musculoskeletal: Warm, 1+ pitting edema bilaterally, sacral decubitus ulcer with some serosanguineous discharge no foul smell  CNS: Alert and oriented  Data Reviewed: Basic Metabolic Panel:  Recent Labs Lab 11/19/15 2307 11/20/15 0417  NA 138 139  K 3.7 3.6  CL 106 108  CO2 24 24  GLUCOSE 117* 101*  BUN 12 10  CREATININE 0.50* 0.39*  CALCIUM 8.8* 8.7*   Liver Function Tests:  Recent Labs Lab 11/19/15 2307 11/20/15 0417  AST 19 21  ALT 12* 12*  ALKPHOS 65 62  BILITOT 0.5 0.6  PROT 5.9* 5.7*  ALBUMIN 2.6* 2.5*   CBC:  Recent Labs Lab 11/19/15 2307 11/20/15 0417  WBC 6.9 5.4  NEUTROABS 5.5  --   HGB 11.1* 11.0*  HCT 34.8* 34.4*  MCV 88.5 90.8  PLT 308 295   BNP (last 3 results)  Recent Labs  08/02/15 0620 08/16/15 0701 11/19/15 2325  BNP 367.7* 528.3* 453.8*   CBG:  Recent Labs Lab 11/20/15 0807  GLUCAP 85   Studies: Dg Chest 2 View  11/20/2015  CLINICAL DATA:  Surgery 5 weeks ago, abscesses, onset of fever 48 hours ago, cough and congestion, history coronary artery disease post CABG and MI, CHF, hypertension EXAM: CHEST  2 VIEW COMPARISON:  08/23/2015 FINDINGS: Minimal enlargement of  cardiac silhouette post CABG. Mediastinal contours normal. Minimal vascular congestion. Improved pulmonary infiltrates and basilar opacification versus previous exam. Mild residual opacity at the posterior lung bases on lateral view. This could represent atelectasis or loculated fluid. No definite acute infiltrate or pneumothorax. RIGHT glenohumeral degenerative changes. IMPRESSION: Enlargement of cardiac silhouette post CABG with minimal pulmonary vascular congestion. Residual opacity in the posterior lower lungs question atelectasis versus residual pleural fluid. Electronically Signed   By: Ulyses Southward M.D.   On: 11/20/2015 00:01   Ct Abdomen Pelvis W Contrast  11/20/2015  CLINICAL DATA:  Abdominal abscess. Fever. History of multiple abscesses since colectomy August 2016. EXAM: CT ABDOMEN AND PELVIS WITH CONTRAST TECHNIQUE: Multidetector CT imaging of the abdomen and pelvis was performed using the standard protocol following bolus administration of intravenous contrast. CONTRAST:  OMNIPAQUE IOHEXOL 300 MG/ML  SOLN COMPARISON:  Most recent abdominal CT 10/25/2015 FINDINGS: Lower chest: Dependent left lower lobe consolidation with small left pleural effusion. Dependent atelectasis in the right lower lobe. Calcified granuloma in the left lung. The heart is enlarged. Coronary artery calcifications are seen. Liver: No evidence focal lesion or significant change from prior. Hepatobiliary:  Gallbladder physiologically distended, no calcified stone. No biliary dilatation. Pancreas: No ductal dilatation.  No peripancreatic inflammation. Spleen: Upper limits of normal in size.  No focal abnormality. Adrenal glands: No nodule. Kidneys: Symmetric renal enhancement. No hydronephrosis. Nonobstructing calculi bilaterally. Stomach/Bowel: Stomach physiologically distended. There are no dilated or thickened small bowel loops. Right lower quadrant ileostomy with small peristomal hernia. Post colectomy. The in situ sigmoid  colon is fluid-filled, unchanged. Vascular/Lymphatic: Splenorenal shunt is noted. No retroperitoneal adenopathy. Abdominal aorta is normal in caliber. Moderate atherosclerosis. Reproductive: Heterogeneous enlarged prostate gland causing mass effect on the bladder base. Bladder: Physiologically distended. Other: Percutaneous drainage catheter with tip in the mid abdomen, unchanged from prior exam. Small extraluminal gas bubbles adjacent to the catheter, axial image 62. There is edema and soft tissue thickening in the region of the drainage catheter without dominant abscess. Edema is seen in the lower abdomen. No frank intra-abdominal ascites. Musculoskeletal: Sacral decubitus ulcer with thin air tract to the sacrum. This has diminished in size in the the interim. Stable degenerative change in the spine with scleroses at L2, L3 and L4. There are no acute or suspicious osseous abnormalities. IMPRESSION: 1. No fluid collection or recurrent abscess. Percutaneous catheter remains with small amount of extraluminal air adjacent to the catheter tip. This is likely related to catheter flushing. Small amount of fluid and edema in the lower abdomen. 2. Post colectomy with ileostomy, small parastomal hernia. 3. Decreased size of sacral decubitus ulcer. 4. Residual left lower lobe consolidation and pleural effusion. Improved aeration of the right lung base from prior CT. 5. Additional chronic findings are stable. Electronically Signed   By: Rubye Oaks M.D.   On: 11/20/2015 03:47   Scheduled Meds: . antiseptic oral rinse  15 mL Mouth Rinse TID PC  . ceFEPime (MAXIPIME) IV  2 g Intravenous 3 times per day  . dextromethorphan-guaiFENesin  1 tablet Oral BID  . diclofenac sodium  2 g Topical QID  . famotidine  20 mg Oral BID  . feeding supplement (ENSURE ENLIVE)  237 mL Oral BID BM  . feeding supplement (PRO-STAT SUGAR FREE 64)  30 mL Oral TID  . finasteride  5 mg Oral Daily  . furosemide  40 mg Oral Daily  .  magnesium oxide  800 mg Oral BID  . multivitamin with minerals  1 tablet Oral Daily  . pantoprazole  40 mg Oral Daily  . sodium chloride  3 mL Intravenous Q12H  . sucralfate  1 g Oral TID WC & HS  . tamsulosin  0.4 mg Oral QPC supper  . vancomycin  1,000 mg Intravenous Q8H  . ascorbic acid  500 mg Oral BID  . warfarin  5 mg Oral q1800  . Warfarin - Pharmacist Dosing Inpatient   Does not apply q1800   Continuous Infusions:    Pamella Pert  [facility] Pager [phone] If 7PM-7AM, please contact night-coverage at [email or web address], password [facility] 11/21/2015, 7:06 AM  LOS: 1 day

## 2015-11-22 LAB — BASIC METABOLIC PANEL
ANION GAP: 8 (ref 5–15)
BUN: 6 mg/dL (ref 6–20)
CALCIUM: 8.7 mg/dL — AB (ref 8.9–10.3)
CHLORIDE: 106 mmol/L (ref 101–111)
CO2: 27 mmol/L (ref 22–32)
Glucose, Bld: 103 mg/dL — ABNORMAL HIGH (ref 65–99)
Potassium: 3.3 mmol/L — ABNORMAL LOW (ref 3.5–5.1)
SODIUM: 141 mmol/L (ref 135–145)

## 2015-11-22 LAB — PROTIME-INR
INR: 1.94 — ABNORMAL HIGH (ref 0.00–1.49)
Prothrombin Time: 22.1 seconds — ABNORMAL HIGH (ref 11.6–15.2)

## 2015-11-22 LAB — GLUCOSE, CAPILLARY: GLUCOSE-CAPILLARY: 83 mg/dL (ref 65–99)

## 2015-11-22 MED ORDER — WARFARIN SODIUM 5 MG PO TABS
7.5000 mg | ORAL_TABLET | Freq: Once | ORAL | Status: AC
  Administered 2015-11-22: 7.5 mg via ORAL
  Filled 2015-11-22: qty 1

## 2015-11-22 MED ORDER — LEVOFLOXACIN IN D5W 500 MG/100ML IV SOLN
500.0000 mg | INTRAVENOUS | Status: DC
  Filled 2015-11-22: qty 100

## 2015-11-22 MED ORDER — AZITHROMYCIN 500 MG IV SOLR
500.0000 mg | INTRAVENOUS | Status: DC
  Administered 2015-11-22: 500 mg via INTRAVENOUS
  Filled 2015-11-22: qty 500

## 2015-11-22 NOTE — Clinical Social Work Note (Signed)
Clinical Social Work Assessment  Patient Details  Name: Jonathan Le MRN: [id number] Date of Birth: 07/21/1946  Date of referral:  11/22/15               Reason for consult:  Facility Placement                Permission sought to share information with:  [facility] Contact Representative Permission granted to share information::  Yes, Verbal Permission Granted  Name::      Jonathan Le, patient's wife  Agency::   SNF admissions  Relationship::     Contact Information:     Housing/Transportation Living arrangements for the past 2 months:  Skilled Nursing Facility Source of Information:  Patient Patient Interpreter Needed:  None Criminal Activity/Legal Involvement Pertinent to Current Situation/Hospitalization:  No - Comment as needed Significant Relationships:  Spouse Lives with:  Spouse Do you feel safe going back to the place where you live?  Yes (Once patient has received some short term rehab then he plans to return back home.) Need for family participation in patient care:  No (Coment)  Care giving concerns:  Patient does not have any concerns with returning back to [facility]   Social Worker assessment / plan:  Patient is a pleasant alert and oriented male who lives with his wife.  Patient expresses that he would like to return back to [facility] once he is ready for discharge.  Patient expressed that he has been pleased with the care that has been provided for him.  Patient expressed that he has been in the hospital for long period of time, patient is anxious to get home, but he understands he needs to receive his therapy so he can return back home.  Patient is familiar with the role of the social worker, and understands the process for discharging to SNF.  Patient expressed he did not have any other questions, he was just wondering how long insurance will continue to pay for his stay.  CSW informed patient that he would have to check with [facility] about the status of how  many Medicare days he has left.  Employment status:  Retired Health and safety inspectornsurance information:  Medicare PT Recommendations:  Skilled Nursing Facility Information / Referral to community resources:  Skilled Nursing Facility  Patient/Family's Response to care:  Patient and family agreeable to returning back to [facility].  Patient/Family's Understanding of and Emotional Response to Diagnosis, Current Treatment, and Prognosis:  Patient understands his current diagnosis and current treatment plan.  Emotional Assessment Appearance:  Appears stated age Attitude/Demeanor/Rapport:    Affect (typically observed):  Stable, Appropriate, Pleasant, Calm Orientation:  Oriented to Self, Oriented to Place, Oriented to  Time, Oriented to Situation Alcohol / Substance use:  Not Applicable Psych involvement (Current and /or in the community):  No (Comment)  Discharge Needs  Concerns to be addressed:  No discharge needs identified Readmission within the last 30 days:  No Current discharge risk:  None Barriers to Discharge:  No Barriers Identified   Darleene Cleavernterhaus, [name], LCSWA 11/22/2015, 1:01 PM

## 2015-11-22 NOTE — Progress Notes (Signed)
ANTICOAGULATION CONSULT NOTE - Follow Up Consult  Pharmacy Consult for Warfarin Indication: VTE prophylaxis  Allergies  Allergen Reactions  . Unasyn [Ampicillin-Sulbactam Sodium] Hives, Itching and Rash    Diffuse rash not responding to Pepcid and Benadryl.  TDD.  Marland Kitchen Levaquin [Levofloxacin] Nausea Only  . Penicillins Hives    MAR  . Doxycycline Rash    Patient Measurements: Height: 5\' 9"  (175.3 cm) Weight: 243 lb 6.2 oz (110.4 kg) IBW/kg (Calculated) : 70.7  Vital Signs: Temp: 97.7 F (36.5 C) (01/08 0616) Temp Source: Oral (01/08 0616) BP: 121/59 mmHg (01/08 0616) Pulse Rate: 84 (01/08 0616)  Labs:  Recent Labs  11/19/15 2307 11/20/15 0153 11/20/15 0417 11/21/15 0542 11/22/15 0529  HGB 11.1*  --  11.0*  --   --   HCT 34.8*  --  34.4*  --   --   PLT 308  --  295  --   --   APTT  --  35  --   --   --   LABPROT  --  23.3*  --  22.4* 22.1*  INR  --  2.09*  --  1.98* 1.94*  CREATININE 0.50*  --  0.39*  --  <0.30*    CrCl cannot be calculated (Patient has no serum creatinine result on file.).   Medications:  Prescriptions prior to admission  Medication Sig Dispense Refill Last Dose  . acetaminophen (TYLENOL) 325 MG tablet Take 2 tablets (650 mg total) by mouth 2 (two) times daily with breakfast and lunch.   unknown at unknown  . Amino Acids-Protein Hydrolys (FEEDING SUPPLEMENT, PRO-STAT SUGAR FREE 64,) LIQD Take 30 mLs by mouth 3 (three) times daily. 900 mL 0 11/18/2015 at 2100  . diclofenac sodium (VOLTAREN) 1 % GEL Apply 2 g topically 4 (four) times daily.   11/19/2015 at 2000  . famotidine (PEPCID) 20 MG tablet Take 1 tablet (20 mg total) by mouth 2 (two) times daily.   11/19/2015 at 1600  . finasteride (PROSCAR) 5 MG tablet Take 1 tablet (5 mg total) by mouth daily.   11/19/2015 at 0800  . furosemide (LASIX) 40 MG tablet Take 1 tablet (40 mg total) by mouth daily. 30 tablet  11/18/2015 at 1200  . HYDROcodone-acetaminophen (NORCO/VICODIN) 5-325 MG tablet Take 1 tablet by  mouth every 6 (six) hours as needed for moderate pain. 30 tablet 0 unknown at unknown  . magnesium oxide (MAG-OX) 400 (241.3 MG) MG tablet Take 2 tablets (800 mg total) by mouth 2 (two) times daily.   11/19/2015 at 1600  . metroNIDAZOLE (FLAGYL) 500 MG tablet Take 1 tablet (500 mg total) by mouth every 8 (eight) hours. 42 tablet 0 11/19/2015 at 2200  . Multiple Vitamins-Minerals (DECUBI-VITE PO) Take 1 tablet by mouth daily.   11/19/2015 at 0800  . nutrition supplement, JUVEN, (JUVEN) PACK Take 1 packet by mouth daily.   11/19/2015 at 0800  . pantoprazole (PROTONIX) 40 MG tablet Take 40 mg by mouth daily.   11/19/2015 at 0600  . potassium chloride (K-DUR,KLOR-CON) 10 MEQ tablet Take 30 mEq by mouth 2 (two) times daily.   11/19/2015 at 1800  . tamsulosin (FLOMAX) 0.4 MG CAPS capsule Take 1 capsule (0.4 mg total) by mouth daily after supper. 30 capsule  11/19/2015 at 1800  . warfarin (COUMADIN) 5 MG tablet Take 1 tablet (5 mg total) by mouth daily at 6 PM.   11/19/2015 at 1800   Scheduled:  . antiseptic oral rinse  15 mL Mouth Rinse TID  PC  . dextromethorphan-guaiFENesin  1 tablet Oral BID  . diclofenac sodium  2 g Topical QID  . famotidine  20 mg Oral BID  . feeding supplement (ENSURE ENLIVE)  237 mL Oral BID BM  . feeding supplement (PRO-STAT SUGAR FREE 64)  30 mL Oral TID  . finasteride  5 mg Oral Daily  . furosemide  40 mg Oral Daily  . levofloxacin (LEVAQUIN) IV  500 mg Intravenous Q24H  . magnesium oxide  800 mg Oral BID  . multivitamin with minerals  1 tablet Oral Daily  . pantoprazole  40 mg Oral Daily  . potassium chloride  30 mEq Oral Daily  . sodium chloride  3 mL Intravenous Q12H  . sucralfate  1 g Oral TID WC & HS  . tamsulosin  0.4 mg Oral QPC supper  . ascorbic acid  500 mg Oral BID  . warfarin  5 mg Oral q1800  . Warfarin - Pharmacist Dosing Inpatient   Does not apply q1800    Assessment: [age] yoM with Hx CAD s/p CABG, colectomy + ileostomy, HFrEF, HTN, DVT on warfarin at home, admitted  1/5 for sepsis likely d/t viral illness.  To continue warfarin per pharmacy while admitted   Baseline INR therapeutic, aPTT wnl  Prior anticoagulation: warfarin 5 mg daily, last dose 1/5  Significant events:  Today, 11/22/2015:  No new CBC.  Previously with slightly low Hgb and Plt wnl  INR slightly subtherapeutic  Major drug interactions: Levaquin.  Note was on Flagyl for GI infection starting 12/15 through admission on 1/5   No bleeding issues per nursing  Regular diet ordered  Goal of Therapy: INR 2-3  Plan:  Give warfarin 7.5 mg tonight.  Suspect INR was elevated d/t Flagyl, and that effect is now wearing off.  Daily INR  CBC at least q72 hr while on warfarin  Monitor for signs of bleeding or thrombosis  For discharge: if patient is to resume Flagyl, would resume warfarin 5 mg daily.  If not, suggest slightly higher dose such as 7.5 mg MWF and 5 mg all other days with INR recheck in 3-5 days   Bernadene Personrew Titus Drone, PharmD Pager: [phone] 11/22/2015, 11:26 AM

## 2015-11-22 NOTE — Evaluation (Signed)
Physical Therapy Evaluation Patient Details Name: Jonathan Le MRN: [id number] DOB: 05/16/1946 Today's Date: 11/22/2015   History of Present Illness  70 y.o. male admitted with sepsis. HX  of CAD S/P CABG, CHF, HTN, DVT, OSA, and Glaucoma with a complex recent surgical history who was sent from SNF. After CABG in 7/16,[facility]. Has a sacral dcubitus thet was treated in past with PLS and VAC attempts per patient. Has a colostomy and L drain from abdomen.  Clinical Impression  On eval, pt required mod assist +2 for bed mobility and attempts at lateral scoot towards HOB. Sat EOB at least 8 minutes while performing LE exercises. Recommend return to SNF to continue rehab.     Follow Up Recommendations SNF    Equipment Recommendations  None recommended by PT    Recommendations for Other Services       Precautions / Restrictions Precautions Precautions: Fall Precaution Comments: sacral decub, drain lines from abdomen; knee pain, colostomy Other Brace/Splint: hinged knee brace on the RLE for comfort per PT note from previous admission- not present Restrictions Weight Bearing Restrictions: No      Mobility  Bed Mobility Overal bed mobility: Needs Assistance Bed Mobility: Supine to Sit;Sit to Supine Rolling: Mod assist;+2 for safety/equipment   Supine to sit: Mod assist;+2 for physical assistance;+2 for safety/equipment;HOB elevated Sit to supine: Mod assist;+2 for physical assistance;+2 for safety/equipment;HOB elevated   General bed mobility comments: Assist for trunk and bil LEs. Increased time. Utilized bedpad for scooting, positioning. Pt puts forth max effort but still requires significant assist.  Transfers     Transfers: Lateral/Scoot Transfers          Lateral/Scoot Transfers: Max assist;+2 safety/equipment;+2 physical assistance General transfer comment: Attempted lateral scoot towards HOB with bed tilted slightly and with use of bedpad  to aid with scooting. Pt able to scoot a small amount with +2 assist.   Ambulation/Gait                Stairs            Wheelchair Mobility    Modified Rankin (Stroke Patients Only)       Balance   Sitting-balance support: Bilateral upper extremity supported;Feet supported Sitting balance-Leahy Scale: Good Sitting balance - Comments: sat eob for 8 minutes while performing exercises                                     Pertinent Vitals/Pain Pain Assessment: Faces Faces Pain Scale: Hurts little more Pain Location: buttocks Pain Intervention(s): Repositioned    Home Living Family/patient expects to be discharged to:: Skilled nursing facility                      Prior Function Level of Independence: Needs assistance   Gait / Transfers Assistance Needed: non ambulatory. [facility]      Comments: has needed assistance for adls/mobility since beginning of surgeries     Hand Dominance        Extremity/Trunk Assessment   Upper Extremity Assessment: Generalized weakness           Lower Extremity Assessment: Generalized weakness      Cervical / Trunk Assessment: Kyphotic  Communication      Cognition Arousal/Alertness: Awake/alert Behavior During Therapy: WFL for tasks assessed/performed Overall Cognitive Status: Within Functional Limits for tasks  assessed                      General Comments      Exercises General Exercises - Lower Extremity Ankle Circles/Pumps: AROM;Both;15 reps;Seated Long Arc Quad: AAROM;Both;15 reps;Seated Hip Flexion/Marching: AROM;Both;15 reps;Seated      Assessment/Plan    PT Assessment Patient needs continued PT services  PT Diagnosis Generalized weakness;Acute pain   PT Problem List Decreased strength;Decreased activity tolerance;Decreased mobility;Decreased knowledge of use of DME;Pain  PT Treatment Interventions Functional mobility training;Therapeutic  activities;Therapeutic exercise;Patient/family education   PT Goals (Current goals can be found in the Care Plan section) Acute Rehab PT Goals Patient Stated Goal: get strength back PT Goal Formulation: With patient Time For Goal Achievement: 12/06/15 Potential to Achieve Goals: Fair    Frequency Min 2X/week   Barriers to discharge        Co-evaluation               End of Session   Activity Tolerance: Patient tolerated treatment well Patient left: with call bell/phone within reach;with bed alarm set           Time: 4098-1191 PT Time Calculation (min) (ACUTE ONLY): 36 min   Charges:   PT Evaluation $PT Eval Moderate Complexity: 1 Procedure PT Treatments $Therapeutic Activity: 8-22 mins   PT G Codes:        Rebeca Alert, MPT Pager: [phone]

## 2015-11-22 NOTE — NC FL2 (Signed)
[place] MEDICAID FL2 LEVEL OF CARE SCREENING TOOL     IDENTIFICATION  Patient Name: Jonathan Le Birthdate: 1945/12/06 Sex: male Admission Date (Current Location): 11/19/2015  [facility] [place] Number:  Producer, television/film/video and Address:  [facility],  [address] [place], [place] [address]      Provider Number: [id number]  Attending Physician Name and Address:  Leatha Gilding, MD  Relative Name and Phone Number:     [place], [place] [phone]  [facility]     Current Level of Care: Hospital Recommended Level of Care: Skilled Nursing Facility Prior Approval Number:    Date Approved/Denied:   PASRR Number: [id number]  Discharge Plan: SNF    Current Diagnoses: Patient Active Problem List   Diagnosis Date Noted  . Fever 11/20/2015  . Cough 11/20/2015  . Hypoxemia   . Arterial hypotension   . Severe sepsis (HCC)   . Acute pulmonary edema (HCC)   . Congestive heart failure (HCC)   . Sepsis (HCC) 10/16/2015  . Warfarin-induced coagulopathy (HCC) 10/16/2015  . Anemia of chronic disease 10/16/2015  . Hyponatremia 10/16/2015  . Hypotension 10/16/2015  . Chronic combined systolic and diastolic congestive heart failure (HCC) 09/25/2015  . Tracheostomy care (HCC) 09/18/2015  . Chronic respiratory failure (HCC) 09/17/2015  . Abscess of abdominal cavity (HCC)   . Enterococcal infection   . Sacral decubitus ulcer   . Hypoalbuminemia due to protein-calorie malnutrition (HCC)   . 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 Intra-abdominal abscess (HCC)   . Debility 08/27/2015  . Acute on chronic respiratory failure, unspecified whether with hypoxia or hypercapnia (HCC)   . Encounter for orogastric (OG) tube placement   . Abdominal abscess (HCC)   . Abscess   . Encounter for central line placement   . Encounter for intubation   . Respiratory failure (HCC)   . Septic shock (HCC)   . Blood poisoning (HCC)   .  Tracheostomy status (HCC)   . Acute respiratory failure (HCC)   . Pressure ulcer 06/21/2015  . Shock (HCC) 06/18/2015  . Abdominal pain, generalized   . Shortness of breath   . Hypoxia   . 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 Cardiomyopathy, ischemic 05/21/2015  . CAD, multiple vessel 05/21/2015  . Acute systolic congestive heart failure, NYHA class 3 (HCC) 05/21/2015  . Morbid obesity (HCC) 05/20/2015  . OSA on CPAP 05/20/2015  . Other specified hypotension   . NSTEMI (non-ST elevated myocardial infarction) (HCC) 05/19/2015  . Benign prostatic hyperplasia with urinary obstruction 04/24/2015  . Chronic LBP 12/05/2013  . Essential hypertension 09/04/2013    Orientation RESPIRATION BLADDER Height & Weight    Self, Time, Situation, Place  Normal Continent 5\' 9"  (175.3 cm) 250 lbs.  BEHAVIORAL SYMPTOMS/MOOD NEUROLOGICAL BOWEL NUTRITION STATUS      Ileostomy Diet  AMBULATORY STATUS COMMUNICATION OF NEEDS Skin   Limited Assist Verbally Surgical wounds                       Personal Care Assistance Level of Assistance  Bathing, Dressing Bathing Assistance: Limited assistance   Dressing Assistance: Limited assistance     Functional Limitations Info  Sight Sight Info: Adequate Hearing Info: Adequate Speech Info: Adequate    SPECIAL CARE FACTORS FREQUENCY  PT (By licensed PT), OT (By licensed OT)     PT Frequency: 5x per week OT Frequency: 5x per week  Contractures      Additional Factors Info  Code Status, Allergies  Full Code Code Status Info: Full Allergies Info: UNASYN, LEVAQUIN, PENICILLINS, DOXYCYCLINE           Current Medications (11/22/2015):  This is the current hospital active medication list Current Facility-Administered Medications  Medication Dose Route Frequency Provider Last Rate Last Dose  . acetaminophen (TYLENOL) tablet 650 mg  650 mg Oral Q6H PRN Lorretta HarpXilin Niu, MD       Or  . acetaminophen (TYLENOL) suppository 650 mg  650 mg Rectal Q6H  PRN Lorretta HarpXilin Niu, MD      . albuterol (PROVENTIL) (2.5 MG/3ML) 0.083% nebulizer solution 2.5 mg  2.5 mg Nebulization Q4H PRN Lorretta HarpXilin Niu, MD   2.5 mg at 11/21/15 2358  . antiseptic oral rinse (BIOTENE) solution 15 mL  15 mL Mouth Rinse TID PC Lorretta HarpXilin Niu, MD   15 mL at 11/20/15 0843  . azithromycin (ZITHROMAX) 500 mg in dextrose 5 % 250 mL IVPB  500 mg Intravenous Q24H Costin Otelia SergeantM Gherghe, MD      . dextromethorphan-guaiFENesin [facility](MUCINEX DM) 30-600 MG per 12 hr tablet 1 tablet  1 tablet Oral BID Lorretta HarpXilin Niu, MD   1 tablet at 11/22/15 1156  . diclofenac sodium (VOLTAREN) 1 % transdermal gel 2 g  2 g Topical QID Lorretta HarpXilin Niu, MD   2 g at 11/21/15 1343  . famotidine (PEPCID) tablet 20 mg  20 mg Oral BID Lorretta HarpXilin Niu, MD   20 mg at 11/22/15 1157  . feeding supplement (ENSURE ENLIVE) (ENSURE ENLIVE) liquid 237 mL  237 mL Oral BID BM Nishant Dhungel, MD   237 mL at 11/20/15 1643  . feeding supplement (PRO-STAT SUGAR FREE 64) liquid 30 mL  30 mL Oral TID Lorretta HarpXilin Niu, MD   30 mL at 11/20/15 1103  . finasteride (PROSCAR) tablet 5 mg  5 mg Oral Daily Lorretta HarpXilin Niu, MD   5 mg at 11/22/15 1155  . furosemide (LASIX) tablet 40 mg  40 mg Oral Daily Nishant Dhungel, MD   40 mg at 11/22/15 1156  . HYDROcodone-acetaminophen (NORCO/VICODIN) 5-325 MG per tablet 1 tablet  1 tablet Oral Q6H PRN Lorretta HarpXilin Niu, MD      . magnesium oxide (MAG-OX) tablet 800 mg  800 mg Oral BID Lorretta HarpXilin Niu, MD   800 mg at 11/22/15 1156  . multivitamin with minerals tablet 1 tablet  1 tablet Oral Daily Lorretta HarpXilin Niu, MD   1 tablet at 11/22/15 1155  . ondansetron (ZOFRAN) injection 4 mg  4 mg Intravenous Q8H PRN Lorretta HarpXilin Niu, MD      . pantoprazole (PROTONIX) EC tablet 40 mg  40 mg Oral Daily Lorretta HarpXilin Niu, MD   40 mg at 11/22/15 1156  . potassium chloride (K-DUR,KLOR-CON) CR tablet 30 mEq  30 mEq Oral Daily Leatha Gildingostin M Gherghe, MD   30 mEq at 11/22/15 1157  . sodium chloride 0.9 % injection 3 mL  3 mL Intravenous Q12H Lorretta HarpXilin Niu, MD   3 mL at 11/21/15 1038  . sucralfate (CARAFATE) 1  GM/10ML suspension 1 g  1 g Oral TID WC & HS Lorretta HarpXilin Niu, MD   1 g at 11/20/15 0804  . tamsulosin (FLOMAX) capsule 0.4 mg  0.4 mg Oral QPC supper Lorretta HarpXilin Niu, MD   0.4 mg at 11/21/15 1805  . vitamin C (ASCORBIC ACID) tablet 500 mg  500 mg Oral BID Lorretta HarpXilin Niu, MD   500 mg at 11/22/15 1156  . warfarin (COUMADIN) tablet 7.5 mg  7.5 mg Oral ONCE-1800 Danford Bad, RPH      . Warfarin - Pharmacist Dosing Inpatient   Does not apply q1800 Lorretta Harp, MD         Discharge Medications: Please see discharge summary for a list of discharge medications.  Relevant Imaging Results:  Relevant Lab Results:   Additional Information Pt uses CPAP.   SS # [id number]  Darleene Cleaver, LCSWA

## 2015-11-22 NOTE — Progress Notes (Signed)
PROGRESS NOTE  Ardyth HarpsHarold D Henslee UJW:[id number]:[id number] DOB: 04/20/1946 DOA: 11/19/2015 PCP: Elizabeth PalauANDERSON,TERESA, FNP  Brief narrative 70 year old male with history of coronary artery disease status post CABG, CHF with h EF of 25-30%, hypertension, DVT on Coumadin, OSA, glaucoma, multiple recent hospitalizations for recurrent intra-abdominal abscess with sepsis (last hospitalized 3 weeks back) requiring frequent CT drainage and on multiple antibiotics over the course who was discharged to be omental rehabilitation and was sent to the hospital with fever of 102F. Patient also reports cough with greenish phlegm for the past 6 days, associated with nasal congestion and body aches. Patient denied any abdominal pain, chest pain, shortness of breath, nausea, vomiting or urinary symptoms. Has good colostomy output and reports his abdominal drain to be working fine. In the ED he was found to be septic with fever of 101.1 F, tachycardic and tachypneic. Lactic acid, pro calcitonin and CBC were normal.. Renal function was unremarkable. Patient admitted with sepsis. Cultures sent on admission. CT abdomen and pelvis repeated showing no signs of abscess with small amount of fluid collection in the pelvic area and improvement in the size of the sacral decubitus ulcer.  HPI/Subjective - no chest pain, shortness of breath, no abdominal pain, nausea or vomiting.    Assessment/Plan: Sepsis - This is most likely associated with acute viral illness. Chest x-ray does show residual opacity in the posterior lower lung possible for atelectasis versus residual pleural fluid. Abdominal CT without any new abscess which is reassuring. - He has been placed on empiric vancomycin and cefepime for first 48 hours, cultures have remained negative, narrow to Levaquin on 1/8 for presumed URI.  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 He had closure of the abdominal wall with wound VAC and ileostomy on 06/20/2015 by Dr. Donell BeersByerly. Reportedly has discussed rectal stump leak on Gastrografin enema. Plan for repeat Gastrografin enema in 2-3 weeks as per prior discharge by surgery. - No new abscess seen on abdominal CT. Has an abdominal drain placed on prior hospitalization which seems to be functioning fine. Monitor drain output. - Patient supposed to follow-up with Dr. Kae Hellerosenbaum as outpatient. Rectal stump leak seen on Gastrografin enema. Will likely need abdominal drain for a very long time. Surgery had discussed with plastic surgeon during prior hospitalization and wanted his nutritional status to be improved before consideration for a flap. - I will obtain a Gastrografin enema for 1/9. (Dr. Gonzella Lexhungel discussed with with Dr Abbey Chattersosenbower on the phone and agrees with this) - Call [place] surgery as needed.  OSA: - Continue nighttime CPAP  CAD:  - s/p CABG x 3.  - Stable, no chest pain  Sacral decubitus ulcer-stage IV - Appears clean. CT abdomen pelvis repeated on admission showed improvement of the ulcer. - wound care consulted, patient known to them, appears to be healing from their standpoint  Chronic combined systolic and diastolic CHF (congestive heart failure) (HCC):  - Last EF of 25 and 30%. Has bilateral leg edema. Received IV fluids on admission which has been discontinued. I - resume Lasix/potassium  Anemia of chronic disease: - Hemoglobin stable  BPH:  - stable - Continue Flomax and Proscar  Hx of DVT - on coumadin. Dosing per pharmacy.   DVT prophylaxis: On coumadin  Code Status: Full  code Family Communication:no family at bedside Disposition Plan: tele monitoring, SNF 1-2 days  Consultants:  None   Procedures:  CT abd and pelvis  Antibiotics:  vanco  and cefepime 1/5 >> 1/8  Levaquin 1/8 >>  Patient was discharged on 2 weeks of oral Keflex and Flagyl on 10/29/2015.  Objective: Filed Vitals:   11/21/15 1943 11/22/15 0616  BP: 104/64 121/59  Pulse: 84 84  Temp: 98.1 F (36.7 C) 97.7 F (36.5 C)  Resp: 20 20    Intake/Output Summary (Last 24 hours) at 11/22/15 0913 Last data filed at 11/22/15 0522  Gross per 24 hour  Intake   1270 ml  Output   2600 ml  Net  -1330 ml   Filed Weights   11/20/15 1431 11/21/15 0613 11/22/15 0616  Weight: 110 kg (242 lb 8.1 oz) 110.3 kg (243 lb 2.7 oz) 110.4 kg (243 lb 6.2 oz)   Exam:  General:  Elderly male not in distress  HEENT: No pallor, moist mucosa  Chest: Diminished bibasilar breath sounds  CVS: Normal S1-S2, no murmurs rub or gallop  GI: Ileostomy with output, intra-abdominal drain in place  Musculoskeletal: Warm, 1+ pitting edema bilaterally, sacral decubitus ulcer with some serosanguineous discharge no foul smell  CNS: Alert and oriented  Data Reviewed: Basic Metabolic Panel:  Recent Labs Lab 11/19/15 2307 11/20/15 0417 11/22/15 0529  NA 138 139 141  K 3.7 3.6 3.3*  CL 106 108 106  CO2 24 24 27   GLUCOSE 117* 101* 103*  BUN 12 10 6   CREATININE 0.50* 0.39* <0.30*  CALCIUM 8.8* 8.7* 8.7*   Liver Function Tests:  Recent Labs Lab 11/19/15 2307 11/20/15 0417  AST 19 21  ALT 12* 12*  ALKPHOS 65 62  BILITOT 0.5 0.6  PROT 5.9* 5.7*  ALBUMIN 2.6* 2.5*   CBC:  Recent Labs Lab 11/19/15 2307 11/20/15 0417  WBC 6.9 5.4  NEUTROABS 5.5  --   HGB 11.1* 11.0*  HCT 34.8* 34.4*  MCV 88.5 90.8  PLT 308 295   BNP (last 3 results)  Recent Labs  08/02/15 0620 08/16/15 0701 11/19/15 2325  BNP 367.7* 528.3* 453.8*   CBG:  Recent Labs Lab 11/20/15 0807 11/21/15 0758 11/22/15 0820  GLUCAP 85 90 83   Studies: No results found. Scheduled Meds: . antiseptic oral rinse  15 mL Mouth Rinse TID PC  . ceFEPime (MAXIPIME) IV  2 g Intravenous 3 times per  day  . dextromethorphan-guaiFENesin  1 tablet Oral BID  . diclofenac sodium  2 g Topical QID  . famotidine  20 mg Oral BID  . feeding supplement (ENSURE ENLIVE)  237 mL Oral BID BM  . feeding supplement (PRO-STAT SUGAR FREE 64)  30 mL Oral TID  . finasteride  5 mg Oral Daily  . furosemide  40 mg Oral Daily  . magnesium oxide  800 mg Oral BID  . multivitamin with minerals  1 tablet Oral Daily  . pantoprazole  40 mg Oral Daily  . potassium chloride  30 mEq Oral Daily  . sodium chloride  3 mL Intravenous Q12H  . sucralfate  1 g Oral TID WC & HS  . tamsulosin  0.4 mg Oral QPC supper  . vancomycin  1,000 mg Intravenous Q8H  . ascorbic acid  500 mg Oral BID  . warfarin  5 mg Oral q1800  . Warfarin - Pharmacist Dosing Inpatient   Does not apply q1800   Continuous Infusions:    Arabella Revelle  [facility] Pager [phone] If 7PM-7AM, please contact night-coverage at [email or web address], password [facility] 11/22/2015, 9:13 AM  LOS: 2 days

## 2015-11-23 ENCOUNTER — Inpatient Hospital Stay (HOSPITAL_COMMUNITY): Payer: Medicare Other

## 2015-11-23 DIAGNOSIS — L89154 Pressure ulcer of sacral region, stage 4: Secondary | ICD-10-CM

## 2015-11-23 DIAGNOSIS — R509 Fever, unspecified: Secondary | ICD-10-CM

## 2015-11-23 DIAGNOSIS — A419 Sepsis, unspecified organism: Principal | ICD-10-CM

## 2015-11-23 DIAGNOSIS — K651 Peritoneal abscess: Secondary | ICD-10-CM

## 2015-11-23 DIAGNOSIS — I10 Essential (primary) hypertension: Secondary | ICD-10-CM

## 2015-11-23 DIAGNOSIS — I5042 Chronic combined systolic (congestive) and diastolic (congestive) heart failure: Secondary | ICD-10-CM

## 2015-11-23 DIAGNOSIS — R05 Cough: Secondary | ICD-10-CM

## 2015-11-23 LAB — BASIC METABOLIC PANEL
Anion gap: 5 (ref 5–15)
BUN: 7 mg/dL (ref 6–20)
CALCIUM: 8.8 mg/dL — AB (ref 8.9–10.3)
CO2: 28 mmol/L (ref 22–32)
CREATININE: 0.37 mg/dL — AB (ref 0.61–1.24)
Chloride: 108 mmol/L (ref 101–111)
GFR calc Af Amer: >60 mL/min (ref >60)
Glucose, Bld: 102 mg/dL — ABNORMAL HIGH (ref 65–99)
POTASSIUM: 3.5 mmol/L (ref 3.5–5.1)
SODIUM: 141 mmol/L (ref 135–145)

## 2015-11-23 LAB — CBC
HCT: 33.9 % — ABNORMAL LOW (ref 39.0–52.0)
Hemoglobin: 10.5 g/dL — ABNORMAL LOW (ref 13.0–17.0)
MCH: 28.4 pg (ref 26.0–34.0)
MCHC: 31 g/dL (ref 30.0–36.0)
MCV: 91.6 fL (ref 78.0–100.0)
PLATELETS: 235 10*3/uL (ref 150–400)
RBC: 3.7 MIL/uL — AB (ref 4.22–5.81)
RDW: 18.3 % — AB (ref 11.5–15.5)
WBC: 4.5 10*3/uL (ref 4.0–10.5)

## 2015-11-23 LAB — GLUCOSE, CAPILLARY: GLUCOSE-CAPILLARY: 98 mg/dL (ref 65–99)

## 2015-11-23 LAB — PROTIME-INR
INR: 1.95 — ABNORMAL HIGH (ref 0.00–1.49)
PROTHROMBIN TIME: 22.1 s — AB (ref 11.6–15.2)

## 2015-11-23 MED ORDER — IOHEXOL 300 MG/ML  SOLN: 150.0000 mL | Freq: Once | INTRAMUSCULAR | Status: DC | PRN

## 2015-11-23 MED ORDER — ALBUTEROL SULFATE (2.5 MG/3ML) 0.083% IN NEBU
2.5000 mg | INHALATION_SOLUTION | RESPIRATORY_TRACT | Status: DC | PRN
Start: 2015-11-23 — End: 2018-11-22

## 2015-11-23 MED ORDER — HYDROCODONE-ACETAMINOPHEN 5-325 MG PO TABS: 1.0000 | ORAL_TABLET | Freq: Four times a day (QID) | ORAL | Status: AC | PRN

## 2015-11-23 MED ORDER — WARFARIN SODIUM 4 MG PO TABS
8.0000 mg | ORAL_TABLET | Freq: Once | ORAL | Status: DC
  Filled 2015-11-23: qty 2

## 2015-11-23 MED ORDER — IOHEXOL 300 MG/ML  SOLN
150.0000 mL | Freq: Once | INTRAMUSCULAR | Status: AC | PRN
  Administered 2015-11-23: 450 mL via ORAL

## 2015-11-23 MED ORDER — METRONIDAZOLE 500 MG PO TABS: 500.0000 mg | ORAL_TABLET | Freq: Three times a day (TID) | ORAL | Status: AC

## 2015-11-23 NOTE — Discharge Summary (Signed)
Physician Discharge Summary  Jonathan Le JWJ:[id number] DOB: 02/12/1946 DOA: 11/19/2015  PCP: Elizabeth Palau, FNP  Admit date: 11/19/2015 Discharge date: 11/23/2015  Time spent: >35 minutes  Recommendations for Outpatient Follow-up:  F/u with surgery in 1 week  F/u with wound care at Rehab F/u with MD at SNF  Discharge Diagnoses:  Principal Problem:   Sepsis (HCC) Active Problems:   Benign prostatic hyperplasia with urinary obstruction   Essential hypertension   Morbid obesity (HCC)   OSA on CPAP   S/P CABG x 3   Abdominal abscess (HCC)   Sacral decubitus ulcer   Chronic combined systolic and diastolic congestive heart failure (HCC)   Anemia of chronic disease   Fever   Cough   Discharge Condition: stable   Diet recommendation: low sodium   Filed Weights   11/21/15 0613 11/22/15 0616 11/23/15 0600  Weight: 110.3 kg (243 lb 2.7 oz) 110.4 kg (243 lb 6.2 oz) 109.9 kg (242 lb 4.6 oz)    History of present illness:  70 year old male with history of coronary artery disease status post CABG, CHF with h EF of 25-30%, hypertension, DVT on Coumadin, OSA, glaucoma, multiple recent hospitalizations for recurrent intra-abdominal abscess with sepsis (last hospitalized 3 weeks back) requiring frequent CT drainage and on multiple antibiotics over the course who was discharged to rehabilitation and was sent to the hospital with fever of 102F. Patient also reports cough with greenish phlegm for the past 6 days, associated with nasal congestion and body aches.  Has good colostomy output and reports his abdominal drain to be working fine. In the ED he was found to be septic with fever of 101.1 F, tachycardic and tachypneic. Lactic acid, pro calcitonin and CBC were normal.. -Patient admitted with sepsis. Cultures sent on admission. CT abdomen and pelvis repeated showing no signs of abscess with small amount of fluid collection in the pelvic area and improvement in the size of the sacral decubitus  ulcer.  Hospital Course:  1. Probable Sepsis This is most likely associated with acute viral illness. Chest x-ray does show residual opacity in the posterior lower lung possible for atelectasis versus residual pleural fluid. Abdominal CT without any new abscess which is reassuring. - He has been placed on empiric vancomycin and cefepime for first 48 hours, for presumed URI. cultures have remained negative, he remains afebrile. Discontinued vanc/cefepime   2. 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 He had closure of the abdominal wall with wound VAC and ileostomy on 06/20/2015 by Dr. Donell Beers.  -No new abscess seen on abdominal CT. Has an abdominal drain placed on prior hospitalization which seems to be functioning fine. Patient supposed to follow-up with Dr. Kae Heller as outpatient. Rectal stump leak seen on Gastrografin enema. Will likely need abdominal drain for a very long time. Surgery had discussed with plastic surgeon during prior hospitalization and wanted his nutritional status to be improved before consideration for a flap. - Pt had Gastrografin enema for 1/9. which showed-Resolved rectal leak/channel (Dr. Gonzella Lex discussed with with Dr Abbey Chatters on the phone and agrees with this). Pt recommended to f/u with surgery as outpatient for further evaluation and drainage removal if possible. Patient was on flagyl (planned for 2 week on the last admission). We will cont oral flagyl for 1 week since significant improvement and healing ion abscesses   3. ASA:- Continue nighttime CPAP 4. CAD:  h/o CABG x 3. Stable, no chest pain 5. Sacral decubitus ulcer-stage IV Appears clean. CT abdomen pelvis repeated on admission showed improvement of the ulcer. wound care consulted, patient known to them, appears to be healing from their  standpoint 6. Chronic combined systolic and diastolic CHF (congestive heart failure) (HCC): Last EF of 25 and 30%. resume Lasix/potassium/. Patient is euvolemic  7. Hx of DVTon coumadin. Cont to titrate for INR 2-3. Will need close monitoring at rhab while on flagyl+coumadin interaction    Procedures: Gastrografin: Scout image shows nonobstructive bowel gas pattern and no concerning gas collection. There is a percutaneous drain with tip at the lower central abdomen, positioning stable from prior.  The rectum was cannulated without incident. The balloon was partially inflated under fluoroscopic visualization. The rectal stump was distended with contrast and previously seen channel like contrast extension has resolved. No extraluminal contrast seen today. No abnormal retention of contrast. No contrast seen and around the percutaneous catheter. No unexpected mural irregularity of the rectal stump. As previously seen, there is pelvic floor laxity with low rectum.  IMPRESSION:  Resolved rectal leak/channel. (i.e. Studies not automatically included, echos, thoracentesis, etc; not x-rays)  Consultations:  none  Discharge Exam: Filed Vitals:   11/23/15 0600 11/23/15 0609  BP: 126/66   Pulse: 103   Temp: 99.2 F (37.3 C) 98 F (36.7 C)  Resp: 19     General: alert, oriented. No distress  Cardiovascular: s1,s2 rrr Respiratory: CTA BL  Discharge Instructions  Discharge Instructions    Diet - low sodium heart healthy    Complete by:  As directed      Discharge instructions    Complete by:  As directed   Please follow up with surgery in 1 week     Increase activity slowly    Complete by:  As directed             Medication List    TAKE these medications        acetaminophen 325 MG tablet  Commonly known as:  TYLENOL  Take 2 tablets (650 mg total) by mouth 2 (two) times daily with breakfast and lunch.     albuterol (2.5 MG/3ML) 0.083% nebulizer solution  Commonly  known as:  PROVENTIL  Take 3 mLs (2.5 mg total) by nebulization every 4 (four) hours as needed for wheezing.     DECUBI-VITE PO  Take 1 tablet by mouth daily.     diclofenac sodium 1 % Gel  Commonly known as:  VOLTAREN  Apply 2 g topically 4 (four) times daily.     famotidine 20 MG tablet  Commonly known as:  PEPCID  Take 1 tablet (20 mg total) by mouth 2 (two) times daily.     feeding supplement (PRO-STAT SUGAR FREE 64) Liqd  Take 30 mLs by mouth 3 (three) times daily.     finasteride 5 MG tablet  Commonly known as:  PROSCAR  Take 1 tablet (5 mg total) by mouth daily.     furosemide 40 MG tablet  Commonly known as:  LASIX  Take 1 tablet (40 mg total) by mouth daily.     HYDROcodone-acetaminophen 5-325 MG tablet  Commonly known as:  NORCO/VICODIN  Take 1 tablet by mouth every 6 (six) hours as needed for moderate pain.     magnesium oxide 400 (241.3 Mg) MG tablet  Commonly known as:  MAG-OX  Take 2 tablets (800 mg total) by mouth 2 (two) times daily.  metroNIDAZOLE 500 MG tablet  Commonly known as:  FLAGYL  Take 1 tablet (500 mg total) by mouth every 8 (eight) hours.     nutrition supplement (JUVEN) Pack  Take 1 packet by mouth daily.     pantoprazole 40 MG tablet  Commonly known as:  PROTONIX  Take 40 mg by mouth daily.     potassium chloride 10 MEQ tablet  Commonly known as:  K-DUR,KLOR-CON  Take 30 mEq by mouth 2 (two) times daily.     tamsulosin 0.4 MG Caps capsule  Commonly known as:  FLOMAX  Take 1 capsule (0.4 mg total) by mouth daily after supper.     warfarin 5 MG tablet  Commonly known as:  COUMADIN  Take 1 tablet (5 mg total) by mouth daily at 6 PM.       Allergies  Allergen Reactions  . Unasyn [Ampicillin-Sulbactam Sodium] Hives, Itching and Rash    Diffuse rash not responding to Pepcid and Benadryl.  TDD.  Marland Kitchen Levaquin [Levofloxacin] Nausea Only  . Penicillins Hives    MAR  . Doxycycline Rash       Follow-up Information    Follow up  with ANDERSON,TERESA, FNP In 1 week.   Specialty:  Nurse Practitioner   Contact information:   [address] [name] [address] [phone]       Follow up with ROSENBOWER,TODD J, MD. Schedule an appointment as soon as possible for a visit today.   Specialty:  General Surgery   Contact information:   [address] [place] [address] [phone]        The results of significant diagnostics from this hospitalization (including imaging, microbiology, ancillary and laboratory) are listed below for reference.    Significant Diagnostic Studies: Dg Chest 2 View  11/20/2015  CLINICAL DATA:  Surgery 5 weeks ago, abscesses, onset of fever 48 hours ago, cough and congestion, history coronary artery disease post CABG and MI, CHF, hypertension EXAM: CHEST  2 VIEW COMPARISON:  08/23/2015 FINDINGS: Minimal enlargement of cardiac silhouette post CABG. Mediastinal contours normal. Minimal vascular congestion. Improved pulmonary infiltrates and basilar opacification versus previous exam. Mild residual opacity at the posterior lung bases on lateral view. This could represent atelectasis or loculated fluid. No definite acute infiltrate or pneumothorax. RIGHT glenohumeral degenerative changes. IMPRESSION: Enlargement of cardiac silhouette post CABG with minimal pulmonary vascular congestion. Residual opacity in the posterior lower lungs question atelectasis versus residual pleural fluid. Electronically Signed   By: Ulyses Southward M.D.   On: 11/20/2015 00:01   Ct Abdomen Pelvis W Contrast  11/20/2015  CLINICAL DATA:  Abdominal abscess. Fever. History of multiple abscesses since colectomy August 2016. EXAM: CT ABDOMEN AND PELVIS WITH CONTRAST TECHNIQUE: Multidetector CT imaging of the abdomen and pelvis was performed using the standard protocol following bolus administration of intravenous contrast. CONTRAST:  OMNIPAQUE IOHEXOL 300 MG/ML  SOLN COMPARISON:  Most recent abdominal  CT 10/25/2015 FINDINGS: Lower chest: Dependent left lower lobe consolidation with small left pleural effusion. Dependent atelectasis in the right lower lobe. Calcified granuloma in the left lung. The heart is enlarged. Coronary artery calcifications are seen. Liver: No evidence focal lesion or significant change from prior. Hepatobiliary: Gallbladder physiologically distended, no calcified stone. No biliary dilatation. Pancreas: No ductal dilatation.  No peripancreatic inflammation. Spleen: Upper limits of normal in size.  No focal abnormality. Adrenal glands: No nodule. Kidneys: Symmetric renal enhancement. No hydronephrosis. Nonobstructing calculi bilaterally. Stomach/Bowel: Stomach physiologically distended. There  are no dilated or thickened small bowel loops. Right lower quadrant ileostomy with small peristomal hernia. Post colectomy. The in situ sigmoid colon is fluid-filled, unchanged. Vascular/Lymphatic: Splenorenal shunt is noted. No retroperitoneal adenopathy. Abdominal aorta is normal in caliber. Moderate atherosclerosis. Reproductive: Heterogeneous enlarged prostate gland causing mass effect on the bladder base. Bladder: Physiologically distended. Other: Percutaneous drainage catheter with tip in the mid abdomen, unchanged from prior exam. Small extraluminal gas bubbles adjacent to the catheter, axial image 62. There is edema and soft tissue thickening in the region of the drainage catheter without dominant abscess. Edema is seen in the lower abdomen. No frank intra-abdominal ascites. Musculoskeletal: Sacral decubitus ulcer with thin air tract to the sacrum. This has diminished in size in the the interim. Stable degenerative change in the spine with scleroses at L2, L3 and L4. There are no acute or suspicious osseous abnormalities. IMPRESSION: 1. No fluid collection or recurrent abscess. Percutaneous catheter remains with small amount of extraluminal air adjacent to the catheter tip. This is likely  related to catheter flushing. Small amount of fluid and edema in the lower abdomen. 2. Post colectomy with ileostomy, small parastomal hernia. 3. Decreased size of sacral decubitus ulcer. 4. Residual left lower lobe consolidation and pleural effusion. Improved aeration of the right lung base from prior CT. 5. Additional chronic findings are stable. Electronically Signed   By: Rubye OaksMelanie  Ehinger M.D.   On: 11/20/2015 03:47   Ct Abdomen Pelvis W Contrast  10/25/2015  CLINICAL DATA:  70 year old with subtotal colectomy and ileostomy due to ischemic colitis 06/18/2015, subsequent hospitalization in October for abdominal abscess drainage and possible stump breakdown, percutaneous drainage of a large intra-abdominal abscess 10/17/2015, now with persistent leukocytosis. Patient also has a sacral decubitus ulcer. EXAM: CT ABDOMEN AND PELVIS WITH CONTRAST TECHNIQUE: Multidetector CT imaging of the abdomen and pelvis was performed using the standard protocol following bolus administration of intravenous contrast. CONTRAST:  100mL OMNIPAQUE IOHEXOL 300 MG/ML IV. Oral contrast was also administered. COMPARISON:  Multiple prior CT abdomen and pelvis examinations dating back to 06/18/2015, most recently 10/17/2015 at the time of drainage catheter placement. FINDINGS: Lower chest: Bilateral pleural effusions, right greater than left, with associated passive atelectasis in the lower lobes, increased since the 10/16/2015 examination. Calcified granuloma again noted in the left lower lobe. Heart enlarged with extensive 3 vessel coronary atherosclerosis. Hepatobiliary: Liver normal in size. Relative enlargement of the left lobe and caudate lobe compared to the right lobe. Gallbladder contracted, accounting for the mucosal enhancement. No calcified gallstones. No biliary ductal dilation. Pancreas: Normal in appearance without evidence of mass, ductal dilation, or inflammation. Spleen: Normal in size and appearance. Adrenals/Urinary  Tract: Normal appearing adrenal glands. Nonobstructing bilateral renal calculi as noted previously. Kidneys otherwise normal in appearance. Normal-appearing urinary bladder. Stomach/Bowel: Stomach normal in appearance for the degree of distention. Wall thickening and edema involving several loops of what are likely mid and distal jejunum. Small extraluminal gas bubbles adjacent to an abnormal loop in the left upper pelvis. Remainder of the small bowel normal in appearance. Normal-appearing ileostomy in the right periumbilical region. Fluid-filled Hartmann pouch as noted previously. Right paramedian sacral decubitus ulcer. Vascular/Lymphatic: Mild to moderate aortoiliofemoral atherosclerosis without aneurysm. Widely patent visceral arteries. 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 Reproductive: Moderate to marked prostate gland enlargement, unchanged. Other: Edema involving the small bowel mesentery. Previously identified abscess completely drained, with no residual fluid collection. No new abscess. Severe generalized body wall edema. Musculoskeletal: Degenerative disc disease,  spondylosis and facet degenerative changes diffusely throughout the lumbar spine with severe multifactorial spinal stenosis at L2-3 and L4-5 and moderate multifactorial spinal stenosis at L3-4. Bilateral hip joint effusions. IMPRESSION: 1. No evidence of residual abdominal abscess after percutaneous drainage. No new abscess elsewhere. 2. Wall thickening/edema involving a several cm segment of mid and distal jejunum, associated with edema in the small bowel mesentery. Extraluminal gas bubbles in the left upper pelvis are in proximity to one of these abnormal bowel loops and may indicate microperforation. 3. Satisfactory appearance of the ileostomy in the right upper pelvis. 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 5. Bilateral pleural effusions, right greater than left, with associated  passive atelectasis in the lower lobes. 6. Anasarca. 7. Moderate to marked prostate gland enlargement. 8. Osseous findings as above, with multilevel multifactorial spinal stenosis. 9. Right paramedian sacral decubitus ulcer with possible osteomyelitis involving the last coccygeal segment. 10. Bilateral hip joint effusions. Electronically Signed   By: Hulan Saas M.D.   On: 10/25/2015 15:50   Ir Sinus/fist Tube Chk-non Gi  10/26/2015  CLINICAL DATA:  History of partial colectomy for ischemic colitis complicated by development of multiple postoperative abdominal fluid collections / abscesses and several ultrasound and fluoroscopic guided percutaneous drainage catheter placements. Patient recently underwent a CT-guided abdominal fluid collection percutaneous drainage catheter placement on 10/17/2015. Subsequent CT scan performed 10/25/2015 demonstrates complete resolution of previously drained intra-abdominal abscess. As such, patient presents today for fluoroscopic guided percutaneous drainage catheter injection. EXAM: SINUS TRACT INJECTION/FISTULOGRAM COMPARISON:  CT abdomen pelvis -10/25/2015; 08/10/2015; 07/29/2015; 07/21/2015; CT-guided abdominal abscess drainage catheter placement- 10/17/2015; 07/21/2015; ultrasound-guided left abdominal wall percutaneous drainage catheter placement - 08/12/2015 CONTRAST:  15 cc Omnipaque 300 FLUOROSCOPY TIME:  1 minute, 12 seconds (65 mGy) TECHNIQUE: The patient was positioned supine on the fluoroscopy table. A preprocedural spot fluoroscopic image was obtained of the midline of the abdomen existing solitary percutaneous drainage catheter. Multiple spot fluoroscopic and radiographic images were obtained in various obliquities following the injection of a small amount of contrast. Images were reviewed and the drainage catheter was flushed with a small amount of saline and reconnected to a JP bulb. A dressing was placed. The patient tolerated the procedure well without  immediate postprocedural complication. FINDINGS: Preprocedural spot fluoroscopic image demonstrates unchanged positioning of the solitary existing percutaneous drainage catheter with end coiled and locked over the midline of the abdomen. Contrast injection demonstrates opacification of the cheek of breast abscess cavity but was negative for definitive communication with any adjacent loop of large or small intestine. IMPRESSION: Contrast injection of the existing percutaneous drainage catheter is negative for the presence of a fistulous connection to an adjacent loop of large or small bowel. PLAN: Above findings were discussed with referring surgical p.a., Barnetta Chapel. [facility] wants the patient to undergo a contrast-enhanced enema to further evaluate for anastomotic leak prior to removal of the percutaneous drainage catheter. If the contrast-enhanced enema is also negative for fistulous connection to the residual percutaneous drainage catheter, the drainage catheter may be removed at the patient's bedside as per the recommendations of [facility]. Electronically Signed   By: Simonne Come M.D.   On: 10/26/2015 13:46   Dg Colon W/cm - Wo/w Kub  11/23/2015  CLINICAL DATA:  Recurrent abscess with abnormal barium enema 10/26/2015. Follow-up rectal leak EXAM: BE WITH CONTRAST - WITHOUT AND WITH KUB CONTRAST:  OMNIPAQUE IOHEXOL 300 MG/ML  SOLN FLUOROSCOPY TIME:  Radiation Exposure Index (as provided by the fluoroscopic device):  53.1 mGy air kerma If the device does not provide the exposure index: Fluoroscopy Time (in minutes and seconds):  1.8 minutes Number of Acquired Images:  12, including saved fluoroscopy COMPARISON:  10/26/2015 FINDINGS: Scout image shows nonobstructive bowel gas pattern and no concerning gas collection. There is a percutaneous drain with tip at the lower central abdomen, positioning stable from prior. The rectum was cannulated without incident. The balloon was partially inflated under fluoroscopic  visualization. The rectal stump was distended with contrast and previously seen channel like contrast extension has resolved. No extraluminal contrast seen today. No abnormal retention of contrast. No contrast seen and around the percutaneous catheter. No unexpected mural irregularity of the rectal stump. As previously seen, there is pelvic floor laxity with low rectum. IMPRESSION: Resolved rectal leak/channel. Electronically Signed   By: Marnee Spring M.D.   On: 11/23/2015 11:51   Dg Colon W/water Sol Cm  10/26/2015  CLINICAL DATA:  Intra-abdominal abscess. Evaluate for rectal stump leak. EXAM: WATER SOLUBLE CONTRAST ENEMA FLUOROSCOPY TIME:  Radiation Exposure Index (as provided by the fluoroscopic device): 74.6 mGy If the device does not provide the exposure index: Fluoroscopy Time:  0 minutes 57 seconds. Number of Acquired Images:  12 COMPARISON:  CT abdomen pelvis 10/25/2015. FINDINGS: Scout view of the abdomen shows a percutaneous pigtail catheter terminating in the low central abdomen. Stomach is distended with air. Mild gaseous distention of small bowel. After digital rectal examination, enema tip was inserted into the rectum. Small amount of water-soluble contrast was allowed to infuse under gravity. Rectal stump opacifies. A thin trail of contrast extends from the stump, without opacification of additional bowel or leakage of contrast. Delayed images suggest rectal prolapse. IMPRESSION: 1. Opacification of the rectal stump with a thin trail of contrast extending from the stump. No definitive evidence of a fistulous connection to small bowel. No leak. 2. Suspect rectal prolapse. Electronically Signed   By: Leanna Battles M.D.   On: 10/26/2015 16:07    Microbiology: Recent Results (from the past 240 hour(s))  Culture, blood (routine x 2)     Status: None (Preliminary result)   Collection Time: 11/19/15 11:05 PM  Result Value Ref Range Status   Specimen Description BLOOD LEFT HAND  Final    Special Requests BOTTLES DRAWN AEROBIC AND ANAEROBIC 5 ML  Final   Culture   Final    NO GROWTH 2 DAYS Performed at [facility]    Report Status PENDING  Incomplete  Culture, blood (routine x 2)     Status: None (Preliminary result)   Collection Time: 11/19/15 11:17 PM  Result Value Ref Range Status   Specimen Description BLOOD BLOOD RIGHT FOREARM  Final   Special Requests BOTTLES DRAWN AEROBIC AND ANAEROBIC  Final   Culture   Final    NO GROWTH 2 DAYS Performed at [facility]    Report Status PENDING  Incomplete  Urine culture     Status: None   Collection Time: 11/20/15  1:44 AM  Result Value Ref Range Status   Specimen Description URINE, CATHETERIZED  Final   Special Requests NONE  Final   Culture   Final    NO GROWTH 1 DAY Performed at [facility]    Report Status 11/21/2015 FINAL  Final     Labs: Basic Metabolic Panel:  Recent Labs Lab 11/19/15 2307 11/20/15 0417 11/22/15 0529 11/23/15 0510  NA 138 139 141 141  K 3.7 3.6 3.3* 3.5  CL  106 108 106 108  CO2 24 24 27 28   GLUCOSE 117* 101* 103* 102*  BUN 12 10 6 7   CREATININE 0.50* 0.39* <0.30* 0.37*  CALCIUM 8.8* 8.7* 8.7* 8.8*   Liver Function Tests:  Recent Labs Lab 11/19/15 2307 11/20/15 0417  AST 19 21  ALT 12* 12*  ALKPHOS 65 62  BILITOT 0.5 0.6  PROT 5.9* 5.7*  ALBUMIN 2.6* 2.5*   No results for input(s): LIPASE, AMYLASE in the last 168 hours. No results for input(s): AMMONIA in the last 168 hours. CBC:  Recent Labs Lab 11/19/15 2307 11/20/15 0417 11/23/15 0510  WBC 6.9 5.4 4.5  NEUTROABS 5.5  --   --   HGB 11.1* 11.0* 10.5*  HCT 34.8* 34.4* 33.9*  MCV 88.5 90.8 91.6  PLT 308 295 235   Cardiac Enzymes: No results for input(s): CKTOTAL, CKMB, CKMBINDEX, TROPONINI in the last 168 hours. BNP: BNP (last 3 results)  Recent Labs  08/02/15 0620 08/16/15 0701 11/19/15 2325  BNP 367.7* 528.3* 453.8*    ProBNP (last 3 results) No results for input(s):  PROBNP in the last 8760 hours.  CBG:  Recent Labs Lab 11/20/15 0807 11/21/15 0758 11/22/15 0820 11/23/15 0754  GLUCAP 85 90 83 98       Signed:  Eisa Necaise N  [facility] 11/23/2015, 1:06 PM

## 2015-11-23 NOTE — Progress Notes (Signed)
Pt [facility] discharge [facility].  CSW facilitated pt discharge needs including contacting facility, faxing pt discharge information via [employer] [facility], discussing with pt at bedside who notified pt wife via telephone, providing RN phone number to call report, and arranging ambulance transport for [facility].  No further social work needs identified at this time.  CSW signing off.   Loletta SpecterSuzanna Sue Fernicola, MSW, LCSW Clinical Social Work [phone]

## 2015-11-23 NOTE — Progress Notes (Signed)
ANTICOAGULATION CONSULT NOTE - Follow Up Consult  Pharmacy Consult for Warfarin Indication: VTE prophylaxis  Allergies  Allergen Reactions  . Unasyn [Ampicillin-Sulbactam Sodium] Hives, Itching and Rash    Diffuse rash not responding to Pepcid and Benadryl.  TDD.  Marland Kitchen. Levaquin [Levofloxacin] Nausea Only  . Penicillins Hives    MAR  . Doxycycline Rash    Patient Measurements: Height: 5\' 9"  (175.3 cm) Weight: 242 lb 4.6 oz (109.9 kg) IBW/kg (Calculated) : 70.7  Vital Signs: Temp: 98 F (36.7 C) (01/09 0609) Temp Source: Oral (01/09 0600) BP: 126/66 mmHg (01/09 0600) Pulse Rate: 103 (01/09 0600)  Labs:  Recent Labs  11/21/15 0542 11/22/15 0529 11/23/15 0510  HGB  --   --  10.5*  HCT  --   --  33.9*  PLT  --   --  235  LABPROT 22.4* 22.1* 22.1*  INR 1.98* 1.94* 1.95*  CREATININE  --  <0.30* 0.37*    Estimated Creatinine Clearance: 105 mL/min (by C-G formula based on Cr of 0.37).   Assessment: [age] yoM with Hx CAD s/p CABG, colectomy + ileostomy, HFrEF, HTN, DVT on warfarin at home, admitted 1/5 for sepsis likely d/t viral illness.  To continue warfarin per pharmacy while admitted.  His admission INR was therapeutic on home warfarin dose of 5 mg daily, last dose 1/5.   Today, 11/23/2015:  INR 1.95, remains slightly subtherapeutic  CBC:  Hgb decreased to 10.5, Plt remain WNL.  No bleeding or complications reported.  Major drug interactions: Flagyl (12/15-1/5), Levaquin (1/8-1/8), and now Azithromycin (1/8 - __ ).  Diet: eating 25-100% of regular diet  Goal of Therapy: INR 2-3  Plan:  Give warfarin 8 mg tonight.  Daily INR  CBC at least q72 hr while on warfarin  Monitor for signs of bleeding or thrombosis  For discharge: if patient is to resume Flagyl, would resume warfarin 5 mg daily.  If not, suggest slightly higher dose such as 7.5 mg MWF and 5 mg all other days with INR recheck in 3-5 days   Lynann Beaverhristine Ralynn San PharmD, BCPS Pager [phone] 11/23/2015  12:31 PM

## 2015-11-23 NOTE — NC FL2 (Signed)
MEDICAID FL2 LEVEL OF CARE SCREENING TOOL     IDENTIFICATION  Patient Name: Jonathan HarpsHarold D Le Birthdate: 02/22/1946 Sex: male Admission Date (Current Location): 11/19/2015  [place] [place]   Facility and Address:  [facility],  [address], [place] [id number]      Provider Number: [id number]  Attending Physician Name and Address:  Esperanza SheetsUlugbek N Buriev, MD  Relative Name and Phone Number:       Current Level of Care: Hospital Recommended Level of Care: Skilled Nursing Facility Prior Approval Number:    Date Approved/Denied:   PASRR Number: [id number]  Discharge Plan: SNF    Current Diagnoses: Patient Active Problem List   Diagnosis Date Noted  . Fever 11/20/2015  . Cough 11/20/2015  . Hypoxemia   . Arterial hypotension   . Severe sepsis (HCC)   . Acute pulmonary edema (HCC)   . Congestive heart failure (HCC)   . Sepsis (HCC) 10/16/2015  . Warfarin-induced coagulopathy (HCC) 10/16/2015  . Anemia of chronic disease 10/16/2015  . Hyponatremia 10/16/2015  . Hypotension 10/16/2015  . Chronic combined systolic and diastolic congestive heart failure (HCC) 09/25/2015  . Tracheostomy care (HCC) 09/18/2015  . Chronic respiratory failure (HCC) 09/17/2015  . Abscess of abdominal cavity (HCC)   . Enterococcal infection   . Sacral decubitus ulcer   . Hypoalbuminemia due to protein-calorie malnutrition (HCC)   . 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 Intra-abdominal abscess (HCC)   . Debility 08/27/2015  . Acute on chronic respiratory failure, unspecified whether with hypoxia or hypercapnia (HCC)   . Encounter for orogastric (OG) tube placement   . Abdominal abscess (HCC)   . Abscess   . Encounter for central line placement   . Encounter for intubation   . Respiratory failure (HCC)   . Septic shock (HCC)   . Blood poisoning (HCC)   . Tracheostomy status (HCC)   . Acute  respiratory failure (HCC)   . Pressure ulcer 06/21/2015  . Shock (HCC) 06/18/2015  . Abdominal pain, generalized   . Shortness of breath   . Hypoxia   . 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 Cardiomyopathy, ischemic 05/21/2015  . CAD, multiple vessel 05/21/2015  . Acute systolic congestive heart failure, NYHA class 3 (HCC) 05/21/2015  . Morbid obesity (HCC) 05/20/2015  . OSA on CPAP 05/20/2015  . Other specified hypotension   . NSTEMI (non-ST elevated myocardial infarction) (HCC) 05/19/2015  . Benign prostatic hyperplasia with urinary obstruction 04/24/2015  . Chronic LBP 12/05/2013  . Essential hypertension 09/04/2013    Orientation RESPIRATION BLADDER Height & Weight    Self, Time, Situation, Place  Normal Continent 5\' 9"  (175.3 cm) 250 lbs.  BEHAVIORAL SYMPTOMS/MOOD NEUROLOGICAL BOWEL NUTRITION STATUS      Ileostomy Diet  AMBULATORY STATUS COMMUNICATION OF NEEDS Skin   Limited Assist Verbally Surgical wounds                       Personal Care Assistance Level of Assistance  Bathing, Dressing Bathing Assistance: Limited assistance   Dressing Assistance: Limited assistance     Functional Limitations Info  Sight Sight Info: Adequate Hearing Info: Adequate Speech Info: Adequate    SPECIAL CARE FACTORS FREQUENCY  PT (By licensed PT), OT (By licensed OT)     PT Frequency: 5x per week OT Frequency: 5x per week  Contractures      Additional Factors Info  Code Status, Allergies Code Status Info: Full Allergies Info: UNASYN, LEVAQUIN, PENICILLINS, DOXYCYCLINE           Current Medications (11/23/2015):  This is the current hospital active medication list Current Facility-Administered Medications  Medication Dose Route Frequency Provider Last Rate Last Dose  . acetaminophen (TYLENOL) tablet 650 mg  650 mg Oral Q6H PRN Lorretta Harp, MD       Or  . acetaminophen (TYLENOL) suppository 650 mg  650 mg Rectal Q6H PRN Lorretta Harp, MD      . albuterol (PROVENTIL)  (2.5 MG/3ML) 0.083% nebulizer solution 2.5 mg  2.5 mg Nebulization Q4H PRN Lorretta Harp, MD   2.5 mg at 11/22/15 2251  . antiseptic oral rinse (BIOTENE) solution 15 mL  15 mL Mouth Rinse TID PC Lorretta Harp, MD   15 mL at 11/23/15 1143  . azithromycin (ZITHROMAX) 500 mg in dextrose 5 % 250 mL IVPB  500 mg Intravenous Q24H Leatha Gilding, MD   500 mg at 11/22/15 1522  . dextromethorphan-guaiFENesin (MUCINEX DM) 30-600 MG per 12 hr tablet 1 tablet  1 tablet Oral BID Lorretta Harp, MD   1 tablet at 11/23/15 1142  . diclofenac sodium (VOLTAREN) 1 % transdermal gel 2 g  2 g Topical QID Lorretta Harp, MD   2 g at 11/22/15 1523  . famotidine (PEPCID) tablet 20 mg  20 mg Oral BID Lorretta Harp, MD   20 mg at 11/23/15 1143  . feeding supplement (ENSURE ENLIVE) (ENSURE ENLIVE) liquid 237 mL  237 mL Oral BID BM Nishant Dhungel, MD   237 mL at 11/20/15 1643  . feeding supplement (PRO-STAT SUGAR FREE 64) liquid 30 mL  30 mL Oral TID Lorretta Harp, MD   30 mL at 11/20/15 1103  . finasteride (PROSCAR) tablet 5 mg  5 mg Oral Daily Lorretta Harp, MD   5 mg at 11/23/15 1143  . furosemide (LASIX) tablet 40 mg  40 mg Oral Daily Nishant Dhungel, MD   40 mg at 11/23/15 1142  . HYDROcodone-acetaminophen (NORCO/VICODIN) 5-325 MG per tablet 1 tablet  1 tablet Oral Q6H PRN Lorretta Harp, MD      . iohexol (OMNIPAQUE) 300 MG/ML solution 150 mL  150 mL Other Once PRN Nishant Dhungel, MD      . magnesium oxide (MAG-OX) tablet 800 mg  800 mg Oral BID Lorretta Harp, MD   800 mg at 11/23/15 1142  . multivitamin with minerals tablet 1 tablet  1 tablet Oral Daily Lorretta Harp, MD   1 tablet at 11/23/15 1142  . ondansetron (ZOFRAN) injection 4 mg  4 mg Intravenous Q8H PRN Lorretta Harp, MD      . pantoprazole (PROTONIX) EC tablet 40 mg  40 mg Oral Daily Lorretta Harp, MD   40 mg at 11/23/15 1142  . potassium chloride (K-DUR,KLOR-CON) CR tablet 30 mEq  30 mEq Oral Daily Leatha Gilding, MD   30 mEq at 11/23/15 1142  . sodium chloride 0.9 % injection 3 mL  3 mL Intravenous  Q12H Lorretta Harp, MD   3 mL at 11/23/15 1144  . sucralfate (CARAFATE) 1 GM/10ML suspension 1 g  1 g Oral TID WC & HS Lorretta Harp, MD   1 g at 11/23/15 1145  . tamsulosin (FLOMAX) capsule 0.4 mg  0.4 mg Oral QPC supper Lorretta Harp, MD   0.4 mg at 11/22/15 1852  . vitamin C (ASCORBIC ACID) tablet 500 mg  500 mg Oral BID Lorretta Harp, MD   500 mg at 11/23/15 1142  . warfarin (COUMADIN) tablet 8 mg  8 mg Oral ONCE-1800 Christine E Shade, RPH      . Warfarin - Pharmacist Dosing Inpatient   Does not apply q1800 Lorretta Harp, MD         Discharge Medications: Please see discharge summary for a list of discharge medications.  Relevant Imaging Results:  Relevant Lab Results:   Additional Information Pt uses CPAP.   SS # [id number]  KIDD, SUZANNA A, LCSW

## 2015-11-25 ENCOUNTER — Inpatient Hospital Stay: Payer: Medicare Other | Admitting: Internal Medicine

## 2015-11-25 LAB — CULTURE, BLOOD (ROUTINE X 2)
Culture: NO GROWTH
Culture: NO GROWTH

## 2015-11-26 ENCOUNTER — Other Ambulatory Visit: Payer: Medicare Other

## 2016-01-14 ENCOUNTER — Inpatient Hospital Stay: Payer: Medicare Other | Admitting: Internal Medicine

## 2016-01-27 ENCOUNTER — Other Ambulatory Visit (HOSPITAL_COMMUNITY)
Admission: RE | Admit: 2016-01-27 | Discharge: 2016-01-27 | Disposition: A | Discharge status: Home / Self Care | Payer: Medicare Other | Source: Other Acute Inpatient Hospital | Attending: Family Medicine | Admitting: Family Medicine

## 2016-01-27 ENCOUNTER — Ambulatory Visit (HOSPITAL_COMMUNITY): Payer: Self-pay | Admitting: Lab

## 2016-01-27 DIAGNOSIS — Z029 Encounter for administrative examinations, unspecified: Secondary | ICD-10-CM | POA: Insufficient documentation

## 2016-01-27 LAB — PROTIME-INR
INR: 2.93 — AB (ref 0.00–1.49)
PROTHROMBIN TIME: 30.1 s — AB (ref 11.6–15.2)

## 2016-02-02 DIAGNOSIS — I82409 Acute embolism and thrombosis of unspecified deep veins of unspecified lower extremity: Secondary | ICD-10-CM

## 2016-02-02 DIAGNOSIS — R5381 Other malaise: Secondary | ICD-10-CM | POA: Insufficient documentation

## 2016-02-02 DIAGNOSIS — K2100 Gastro-esophageal reflux disease with esophagitis, without bleeding: Secondary | ICD-10-CM

## 2016-02-02 DIAGNOSIS — Z932 Ileostomy status: Secondary | ICD-10-CM | POA: Insufficient documentation

## 2016-02-02 HISTORY — DX: Acute embolism and thrombosis of unspecified deep veins of unspecified lower extremity: I82.409

## 2016-02-02 HISTORY — DX: Other malaise: R53.81

## 2016-02-02 HISTORY — DX: Gastro-esophageal reflux disease with esophagitis, without bleeding: K21.00

## 2016-02-03 ENCOUNTER — Encounter: Payer: Self-pay | Admitting: Internal Medicine

## 2016-02-11 ENCOUNTER — Telehealth: Payer: Self-pay | Admitting: Internal Medicine

## 2016-02-11 NOTE — Telephone Encounter (Signed)
[facility] for appointment with Dr Rennis GoldenHilty on 02/16/16.  [facility] ([facility]) for Dr Blanchie DessertHilty's schedule on 02/16/16. lp

## 2016-02-16 ENCOUNTER — Ambulatory Visit: Payer: Medicare Other | Admitting: Internal Medicine

## 2016-02-24 ENCOUNTER — Other Ambulatory Visit (HOSPITAL_COMMUNITY)
Admission: RE | Admit: 2016-02-24 | Discharge: 2016-02-24 | Disposition: A | Discharge status: Home / Self Care | Payer: Medicare Other | Source: Hospice | Attending: Family Medicine | Admitting: Family Medicine

## 2016-02-24 DIAGNOSIS — Z7901 Long term (current) use of anticoagulants: Secondary | ICD-10-CM | POA: Diagnosis not present

## 2016-02-24 DIAGNOSIS — Z5181 Encounter for therapeutic drug level monitoring: Secondary | ICD-10-CM | POA: Diagnosis present

## 2016-02-24 LAB — PROTIME-INR
INR: 2.11 — ABNORMAL HIGH (ref 0.00–1.49)
Prothrombin Time: 23.5 seconds — ABNORMAL HIGH (ref 11.6–15.2)

## 2016-02-29 ENCOUNTER — Encounter: Payer: Self-pay | Admitting: Cardiology

## 2016-03-01 ENCOUNTER — Other Ambulatory Visit: Payer: Self-pay | Admitting: Cardiology

## 2016-03-01 ENCOUNTER — Ambulatory Visit
Admission: RE | Admit: 2016-03-01 | Discharge: 2016-03-01 | Disposition: A | Discharge status: Home / Self Care | Payer: Medicare Other | Source: Ambulatory Visit | Attending: Cardiology | Admitting: Cardiology

## 2016-03-01 DIAGNOSIS — R059 Cough, unspecified: Secondary | ICD-10-CM

## 2016-03-01 DIAGNOSIS — R05 Cough: Secondary | ICD-10-CM

## 2016-03-09 ENCOUNTER — Other Ambulatory Visit: Payer: Self-pay | Admitting: Cardiology

## 2016-03-09 DIAGNOSIS — R600 Localized edema: Secondary | ICD-10-CM

## 2016-03-24 ENCOUNTER — Ambulatory Visit (HOSPITAL_COMMUNITY)
Admission: RE | Admit: 2016-03-24 | Discharge: 2016-03-24 | Disposition: A | Discharge status: Home / Self Care | Payer: Medicare Other | Source: Ambulatory Visit | Attending: Vascular Surgery | Admitting: Vascular Surgery

## 2016-03-24 ENCOUNTER — Other Ambulatory Visit: Payer: Self-pay | Admitting: Cardiology

## 2016-03-24 DIAGNOSIS — I110 Hypertensive heart disease with heart failure: Secondary | ICD-10-CM | POA: Diagnosis not present

## 2016-03-24 DIAGNOSIS — I82533 Chronic embolism and thrombosis of popliteal vein, bilateral: Secondary | ICD-10-CM | POA: Diagnosis not present

## 2016-03-24 DIAGNOSIS — I82513 Chronic embolism and thrombosis of femoral vein, bilateral: Secondary | ICD-10-CM | POA: Insufficient documentation

## 2016-03-24 DIAGNOSIS — I509 Heart failure, unspecified: Secondary | ICD-10-CM | POA: Diagnosis not present

## 2016-03-24 DIAGNOSIS — G4730 Sleep apnea, unspecified: Secondary | ICD-10-CM | POA: Insufficient documentation

## 2016-03-24 DIAGNOSIS — R609 Edema, unspecified: Secondary | ICD-10-CM

## 2016-03-25 ENCOUNTER — Encounter: Payer: Self-pay | Admitting: Cardiology

## 2016-03-25 NOTE — Progress Notes (Signed)
Patient ID: Jonathan Le, male   DOB: 02/01/1946, 70 y.o.   MRN: [id number]  Jonathan Le, Jonathan Le    Date of visit:  03/25/2016 DOB:  June 01, 1946    Age:  70 yrs. Medical record number:  [id number]     Account number:  [id number] Primary Care Provider: Lajuana CarryANDERSON,TERESA LYNN ____________________________ CURRENT DIAGNOSES  1. Ischemic Cardiomyopathy  2. Chronic systolic heart failure  3. Dyspnea  4. Edema  5. Personal history of other venous thrombosis and embolism  6. Long term (current) use of anticoagulants  7. Sleep apnea  8. Essential hypertension  9. CAD Native without angina  10. Obesity  ____________________________ ALLERGIES  Doxycycline, Rash, itching  Levaquin, Severe nausea & vomiting  Penicillins, Hives and/or rash  Unasyn, Hives and/or rash ____________________________ MEDICATIONS  1. Tylenol 325 mg tablet, prn  2. albuterol sulfate 2.5 mg/3 mL (0.083 %) solution for nebulization, prn  3. Voltaren 1 % topical gel, prn  4. Pepcid 20 mg tablet, BID  5. Proscar 5 mg tablet  6. hydrocodone 5 mg-acetaminophen 325 mg tablet, Take as directed  7. magnesium oxide 400 mg tablet, Take as directed  8. multivitamin tablet, 1 p.o. daily  9. Protonix 40 mg tablet,delayed release, 1 p.o. daily  10. potassium chloride ER 10 mEq tablet,extended release, 7 p.o. daily  11. tamsulosin 0.4 mg capsule, Take as directed  12. warfarin 5 mg tablet, 1 p.o. daily  13. Lasix 40 mg tablet, BID  14. metoprolol succinate ER 25 mg tablet,extended release 24 hr, 1/2 tab daily  15. spironolactone 25 mg tablet, 1/2 tab daily ____________________________ HISTORY OF PRESENT ILLNESS Returns for cardiac followup. Since he was previously here he is been taking furosemide twice daily and lost 11 pounds of weight. Significant edema that is worse toward the end of the day. Yesterday he had venous Dopplers that showed chronic thrombus in his veins in the upper legs. Lower leg are difficult to visualize due to to edema.  His echocardiogram recentlyshowed an ejection fraction of 20% with anteroapical akinesis. He had a relatively late presentation of his previous myocardial infarction. He tells me that he is able to walk up stairs somewhat now is walk with a walker. He has mild fatigue but is not limited by shortness of breath or chest pain. He is really not on any heart failure medications at the present time. He and his wife had an extensive amount of questions about her disease process apparently has had little in the way of education about what happened to him, Coumadin or a long-term outlook of all this is. ____________________________ PAST HISTORY  Past Medical Illnesses:  hypertension, chronic pain syndrome, obesity, sleep apnea, BPH;  Cardiovascular Illnesses:  CAD, cardiomyopathy(ischemic), CHF;  Surgical Procedures:  CABG, colectomy-total, hernia repair, fasiotomy for heel spur;  NYHA Classification:  II;  Canadian Angina Classification:  Class 0: Asymptomatic;  Cardiology Procedures-Invasive:  cardiac cath (left) July 2016, CABG with LIMA to LAD< SVG to OM, SVG to RCA 05/22/15 Dr. Alla GermanVanTright, cardiac cath (left) July 2016;  Cardiology Procedures-Noninvasive:  echocardiogram April 2017;  Cardiac Cath Results:  normal Left main, occluded LAD, 95% stenosis distal RCA, 90% stenosis OM 2, 30% stenosis OM 3;  LVEF of 20% documented via echocardiogram on 03/07/2016,   ____________________________ CARDIO-PULMONARY TEST DATES EKG Date:  02/29/2016;   Cardiac Cath Date:  05/20/2015;  CABG: 05/22/2015;  Stent Placement Date: 05/20/2015;  Echocardiography Date: 03/01/2016;  Chest Xray Date: 03/01/2016;   ____________________________ FAMILY HISTORY Father -- Father dead,  Death due to natural cause Mother -- Death due to natural cause, Mother dead Sister -- Sister alive and well ____________________________ SOCIAL HISTORY Alcohol Use:  no alcohol use;  Diet:  regular diet;  Lifestyle:  divorced, remarried and 3 children;   Exercise:  exercise is limited due to physical disability;  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 Cardiovascular:  please review HPI Abdominal: denies dyspepsia, GI bleeding, constipation, or diarrhea Genitourinary-Male: frequency, nocturia  Musculoskeletal:  arthritis of the knees Neurological:  difficulty walking Psychiatric:  denies depression or anxiety  ____________________________ PHYSICAL EXAMINATION VITAL SIGNS  Blood Pressure:  116/66 Sitting, Left arm, regular cuff  , 114/70 Standing, Left arm and regular cuff   Pulse:  88/min. Weight:  242.00 lbs. Height:  69"BMI: 35  Constitutional:  pleasant white male in no acute distress, severely obese Skin:  warm and dry to touch, no apparent skin lesions, or masses noted. Head:  normocephalic, normal hair pattern, no masses or tenderness Neck:  supple, without massess. No JVD, thyromegaly or carotid bruits. Carotid upstroke normal. Chest:  normal symmetry, clear to auscultation., healed median sternotomy scar Cardiac:  regular rhythm, normal S1 and S2, No S3 or S4, no murmurs, gallops or rubs detected. Abdomen:  soft, nontender, no massses or organomegaly Peripheral Pulses: pedal pulses difficult due to edema Extremities & Back:  2+ edema, bilateral lower extremity venous varicosities Neurological:  no gross motor or sensory deficits noted, affect appropriate, oriented x3. ____________________________ MOST RECENT LIPID PANEL 05/20/15  CHOL TOTL 121 mg/dl, LDL 69 NM, HDL 40 mg/dl and TRIGLYCER 61 mg/dl ____________________________ IMPRESSIONS/PLAN  1. Ischemic cardiomyopathy with a residual ejection fraction of 20% following late presentation of myocardial infarction and subsequent bypass grafting. 2. History of DVT in the hospital on chronic  Coumadin since then and with chronic DVT now noted bilaterally 3. Chronic pain syndrome 4. Severe obesity 5. Sleep apnea 6. Poor functional status but improving  Recommendations:  1. He will need to be on optimal treatment for chronic systolic heart failure. I recommended initiation of low-dose beta blockade with metoprolol succinate 12.5 mg daily and also spironolactone 12.5 mg daily since he is increased potassium requirements. Continued for a semi-twice daily. With a later date we will add an ACE inhibitor depending on his potassium. His blood pressure is somewhat soft. 2. Continue warfarin for chronic DVT. I'm going to go ahead and get a ventilation perfusion lung scan be sure he does not have occult pulmonary emboli. He may be a candidate to switch to Eliquis but there are a lot of medication changes right now so we will keep him on warfarin for the time being 3. I would like for him to see an electrophysiologist for consideration of a defibrillator 4. Extensive education was done today regarding congestive heart failure, ischemic cardiomyopathy, long-term prognosis, rehabilitation, as well as warfarin and diet education.  I would like to see him back in 2 weeks and he will have a repeat metabolic panel today. Greater than 40 minutes spent with patient and wife including greater than 50% of the time in face to face as well as review of records formulation of plan and patient education.  ____________________________ TODAYS ORDERS  1. Basic Metabolic Panel: Today  2. Ventilation/perfusion lung scan: first  available  3. Return Visit: 2 weeks  4.  Consult EP: soon  ____________________________ Cardiology Physician:  Kerry Hough MD [facility]

## 2016-03-29 ENCOUNTER — Other Ambulatory Visit: Payer: Self-pay | Admitting: Cardiology

## 2016-03-29 ENCOUNTER — Ambulatory Visit: Payer: Medicare Other | Admitting: Internal Medicine

## 2016-03-29 ENCOUNTER — Ambulatory Visit: Payer: Medicare Other | Attending: Nurse Practitioner

## 2016-03-29 DIAGNOSIS — M6281 Muscle weakness (generalized): Secondary | ICD-10-CM | POA: Insufficient documentation

## 2016-03-29 DIAGNOSIS — L0291 Cutaneous abscess, unspecified: Secondary | ICD-10-CM | POA: Diagnosis present

## 2016-03-29 DIAGNOSIS — M25561 Pain in right knee: Secondary | ICD-10-CM | POA: Insufficient documentation

## 2016-03-29 DIAGNOSIS — K651 Peritoneal abscess: Secondary | ICD-10-CM | POA: Insufficient documentation

## 2016-03-29 DIAGNOSIS — R262 Difficulty in walking, not elsewhere classified: Secondary | ICD-10-CM | POA: Diagnosis present

## 2016-03-29 DIAGNOSIS — R0600 Dyspnea, unspecified: Secondary | ICD-10-CM

## 2016-03-29 NOTE — Therapy (Signed)
[facility] [address], [address] [place], [place], [address] Phone: [phone]   Fax:  [phone]  Physical Therapy Evaluation  Patient Details  Name: Jonathan Le MRN: [id number] Date of Birth: 05/08/1946 Referring Provider: Elizabeth PalauAnderson, Teresa, NP  [facility]: [facility]      PT End of Session - 03/29/16 1535    Visit Number 1   Number of Visits 10   Date for PT Re-Evaluation 05/24/16   Authorization Type Use KX modifier on all visits   PT Start Time 1447   PT Stop Time 1535   PT Time Calculation (min) 48 min   Activity Tolerance Patient tolerated treatment well   Behavior During Therapy [facility] for tasks assessed/performed      Past Medical History  Diagnosis Date  . Anxiety   . Hypertension   . Arthritis   . Sleep apnea     wears CPAP nightly  . Glaucoma   . CHF (congestive heart failure) (HCC)     MI, with open heart surgery x 30 sdays ago per pt. (06/26/15)  . S/P CABG (coronary artery bypass graft)   . MI (myocardial infarction) (HCC)   . DVT (deep venous thrombosis) (HCC)   . Adult situational stress disorder   . Decubital ulcer   . GERD (gastroesophageal reflux disease)   . Anemia   . Chronic low back pain   . ED (erectile dysfunction)   . ETD (eustachian tube dysfunction)   . Ganglion cyst   . Ileostomy in place [facility](HCC)   . Osteoarthritis     Past Surgical History  Procedure Laterality Date  . Hernia repair      right  . Eye surgery      bil cataract  . Wisdom tooth extraction    . Fasciotomy foot / toe Left     foot  . Cardiac catheterization N/A 05/20/2015    Procedure: Left Heart Cath and Coronary Angiography;  Surgeon: Kathleene Hazelhristopher D McAlhany, MD;  [facility];  Service: Cardiovascular;  Laterality: N/A;  . Coronary artery bypass graft N/A 05/22/2015    Procedure: CORONARY ARTERY BYPASS GRAFTING (CABG), ON PUMP, TIMES THREE, USING LEFT INTERNAL MAMMARY ARTERY, RIGHT GREATER  SAPHENOUS VEIN HARVESTED ENDOSCOPICALLY;  Surgeon: Kerin PernaPeter Van Trigt, MD;  [facility];  Service: Open Heart Surgery;  Laterality: N/A;  LIMA-LAD, SVG-OM, SVG-PD  . Tee without cardioversion N/A 05/22/2015    Procedure: TRANSESOPHAGEAL ECHOCARDIOGRAM (TEE);  Surgeon: Kerin PernaPeter Van Trigt, MD;  [facility];  Service: Open Heart Surgery;  Laterality: N/A;  . Laparotomy N/A 06/18/2015    Procedure: EXPLORATORY LAPAROTOMY;  Surgeon: Emelia LoronMatthew Wakefield, MD;  [facility];  Service: General;  Laterality: N/A;  . Laparotomy N/A 06/20/2015    Procedure: WASHOUT OF ABDOMEN, CLOSURE OF ABDOMINAL WALL WITH PLACEMENT OF WOUND VAC AND ILLEOSTOMY ;  Surgeon: Almond LintFaera Byerly, MD;  Location: [facility] OR;  Service: General;  Laterality: N/A;  . Tracheostomy tube placement N/A 07/03/2015    Procedure: TRACHEOSTOMY;  Surgeon: Serena ColonelJefry Rosen, MD;  [facility];  Service: ENT;  Laterality: N/A;    There were no vitals filed for this visit.       Subjective Assessment - 03/29/16 1449    Subjective Pt presents to PT with generalized weakness sustained due to illness and immobiliy over the past 9 months.  Pt has been in hospital and rehab for many months and went home 2 months ago.   Pertinent History Heart  attack: 05/17/15 with CAGB , In and out of hospital and rehab over the past 215 days due to medial complications.  Recent onset of Rt shoulder pain   Limitations Walking;Standing   How long can you stand comfortably? 5 minutes with UE support   How long can you walk comfortably? 10 minutes with walker   Patient Stated Goals improve strength and endurance, stand longer    Currently in Pain? No/denies            [facility] PT Assessment - 03/29/16 0001    Assessment   Medical Diagnosis decreased strength (R53.1)   Referring Provider Elizabeth Palau, NP   Onset Date/Surgical Date 05/17/15   Next MD Visit 05/2016   Prior Therapy in hospital and rehab stay.  Home Health PT that ended 2 weeks ago   Precautions   Precautions  Fall   Restrictions   Weight Bearing Restrictions No   Balance Screen   Has the patient fallen in the past 6 months No   Has the patient had a decrease in activity level because of a fear of falling?  No   Is the patient reluctant to leave their home because of a fear of falling?  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   Cognition   Overall Cognitive Status Within Functional Limits for tasks assessed   Posture/Postural Control   Posture/Postural Control Postural limitations   Postural Limitations Rounded Shoulders;Forward head   ROM / Strength   AROM / PROM / Strength AROM;Strength   AROM   Overall AROM  Deficits   Overall AROM Comments hamstring flexiblity limited by 25% in sitting with long arc quad   Strength   Overall Strength Deficits   Overall Strength Comments hip strength 4/5 bil, knees 4+/5, ankles: Rt DF 4-/5, Lt DF 4+/5, PF in sitting not able to perform full AROM due to gastroc weakness   Palpation   Palpation comment pitting edema in bil. legs   Transfers   Transfers Sit to Stand;Stand to Sit   Sit to Stand With upper extremity assist;With armrests;6: Modified independent (Device/Increase time)   Ambulation/Gait   Ambulation/Gait Yes   Ambulation/Gait Assistance 5: Supervision   Ambulation/Gait Assistance Details bil knee flexion and trunk flexion with verbal cues for posture   Ambulation Distance (Feet) 100 Feet   Assistive device Rollator   Gait Pattern Step-to pattern   Ambulation Surface Level   Balance   Balance Assessed  Yes   Standardized Balance Assessment   Standardized Balance Assessment Timed Up and Go Test   Timed Up and Go Test   TUG Normal TUG   Normal TUG (seconds) 25                           PT Education - 03/29/16 1524    Education provided Yes   Education Details seated HEP, walker height   Person(s) Educated Patient;Spouse   Methods Explanation;Demonstration;Handout   Comprehension Verbalized understanding;Returned demonstration  PT Short Term Goals - Apr 02, 2016 1539    PT SHORT TERM GOAL #1   Title be independent in initial HEP   Time 4   Period Weeks   Status New   PT SHORT TERM GOAL #2   Title improve LE strength to perform sit to stand with moderate UE support   Time 4   Period Weeks   Status New   PT SHORT TERM GOAL #3   Title perform TUG in < or = to 21 seconds to reduce risk of falls   Time 4   Period Weeks   Status New   PT SHORT TERM GOAL #4   Title walk for 10 minutes without significant fatigue   Time 4   Period Weeks   Status New           PT Long Term Goals - 2016/04/02 1541    PT LONG TERM GOAL #1   Title be independent in advanced HEP   Time 8   Period Weeks   Status New   PT LONG TERM GOAL #2   Title improve strength and endurance to walk for 15 minutes without significant fatigue   Time 8   Period Weeks   Status New   PT LONG TERM GOAL #3   Title perform TUG in < or = to 18 seconds to reduce falls risk   Time 8   Period Weeks   Status New   PT LONG TERM GOAL #4   Title improve LE strength to perform sit to stand with minimal UE support   Time 8   Period Weeks   Status New   PT LONG TERM GOAL #5   Title wean from walker 50% of the time for home distances   Time 8   Period Weeks   Status New               Plan - 2016-04-02 1536    Clinical Impression Statement Pt presents to PT with significant deconditioning, weakness and endurance deficitis due to illness and immobility over the past 9 months. Pt  had heart attack, CAGB followed by many medical complications.  Pt had stay in hospital and rehab before going home ~2 months ago.  Pt had home health PT when he got home.  Pt demonstrates flexed knees bil, forward trunk flexion, reduced gait velocity, weakness, max UE support with sit to stand and TUG 25 seconds.  Pt will benefit from skilled PT for strength, mobility, endurance and gait training to improve safety and independence in the community.     Rehab Potential Good   PT Frequency 2x / week   PT Duration 8 weeks   PT Treatment/Interventions ADLs/Self Care Home Management;Cryotherapy;Moist Heat;Therapeutic exercise;Therapeutic activities;Functional mobility training;Stair training;Gait training;Neuromuscular re-education;Patient/family education;Manual techniques;Passive range of motion   PT Next Visit Plan Gait, hamstring stretch, endurance and strength tasks   Consulted and Agree with Plan of Care Patient      Patient will benefit from skilled therapeutic intervention in order to improve the following deficits and impairments:  Abnormal gait, Difficulty walking, Postural dysfunction, Decreased strength, Decreased mobility, Decreased balance, Impaired flexibility, Improper body mechanics, Decreased activity tolerance, Cardiopulmonary status limiting activity, Decreased endurance  Visit Diagnosis: Muscle weakness (generalized) - Plan: PT plan of care cert/re-cert  Difficulty in walking, not elsewhere classified - Plan: PT plan of care cert/re-cert      G-Codes - 2016-04-02 1545    Functional Assessment Tool Used TUG 25 seconds  Functional Limitation Mobility: Walking and moving around   Mobility: Walking and Moving Around Current Status [phone]) At least 40 percent but less than 60 percent impaired, limited or restricted   Mobility: Walking and Moving Around Goal Status [phone]) At least 40 percent but less than 60 percent impaired, limited or restricted       Problem List Patient  Active Problem List   Diagnosis Date Noted  . Fever 11/20/2015  . Cough 11/20/2015  . Hypoxemia   . Arterial hypotension   . Severe sepsis (HCC)   . Acute pulmonary edema (HCC)   . Congestive heart failure (HCC)   . Sepsis (HCC) 10/16/2015  . Warfarin-induced coagulopathy (HCC) 10/16/2015  . Anemia of chronic disease 10/16/2015  . Hyponatremia 10/16/2015  . Hypotension 10/16/2015  . Chronic combined systolic and diastolic congestive heart failure (HCC) 09/25/2015  . Tracheostomy care (HCC) 09/18/2015  . Chronic respiratory failure (HCC) 09/17/2015  . Abscess of abdominal cavity (HCC)   . Enterococcal infection   . Sacral decubitus ulcer   . Hypoalbuminemia due to protein-calorie malnutrition (HCC)   . 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 Intra-abdominal abscess (HCC)   . Debility 08/27/2015  . Acute on chronic respiratory failure, unspecified whether with hypoxia or hypercapnia (HCC)   . Encounter for orogastric (OG) tube placement   . Abdominal abscess (HCC)   . Abscess   . Encounter for central line placement   . Encounter for intubation   . Respiratory failure (HCC)   . Septic shock (HCC)   . Blood poisoning (HCC)   . Tracheostomy status (HCC)   . Acute respiratory failure (HCC)   . Pressure ulcer 06/21/2015  . Shock (HCC) 06/18/2015  . Abdominal pain, generalized   . Shortness of breath   . Hypoxia   . 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 Cardiomyopathy, ischemic 05/21/2015  . CAD, multiple vessel 05/21/2015  . Acute systolic congestive heart failure, NYHA class 3 (HCC) 05/21/2015  . Morbid obesity (HCC) 05/20/2015  . OSA on CPAP 05/20/2015  . Other specified hypotension   . NSTEMI (non-ST elevated myocardial infarction) (HCC) 05/19/2015  . Benign prostatic hyperplasia with urinary obstruction 04/24/2015  . Chronic LBP 12/05/2013  . Essential hypertension 09/04/2013     Lorrene Reid, PT 03/29/2016 3:48 PM  [facility] [address], [address] [place], [place], [address] Phone: [phone]   Fax:  [phone]  Name: Jonathan Le MRN: [id number] Date of Birth: [birth year]

## 2016-03-29 NOTE — Patient Instructions (Signed)
KNEE: Extension, Long Arc Quad (Weight)  Place weight around leg. Raise leg until knee is straight. Hold _5__ seconds. Use ___ lb weight. _10__ reps per set (each leg), 4-5__ sets per day, __7_ days per week    Knee Raise   Lift knee and then lower it. Repeat with other knee. Repeat _10__ times each leg. Do _4-5___ sessions per day.  http://gt2.exer.us/445   Copyright  VHI. All rights reserved.  Toe Up   Gently rise up on toes and back on heels. Repeat _20___ times. Do 4-5____ sessions per day.  [facility] [address], [address] [place], [place] [address] Phone # [phone] Fax [phone]  

## 2016-03-30 ENCOUNTER — Encounter: Payer: Self-pay | Admitting: Physical Therapy

## 2016-03-30 ENCOUNTER — Ambulatory Visit: Payer: Medicare Other | Admitting: Physical Therapy

## 2016-03-30 DIAGNOSIS — M25561 Pain in right knee: Secondary | ICD-10-CM

## 2016-03-30 DIAGNOSIS — L0291 Cutaneous abscess, unspecified: Secondary | ICD-10-CM

## 2016-03-30 DIAGNOSIS — M6281 Muscle weakness (generalized): Secondary | ICD-10-CM | POA: Diagnosis not present

## 2016-03-30 DIAGNOSIS — R262 Difficulty in walking, not elsewhere classified: Secondary | ICD-10-CM

## 2016-03-30 DIAGNOSIS — K651 Peritoneal abscess: Secondary | ICD-10-CM

## 2016-03-30 NOTE — Therapy (Signed)
[facility] [address], [address] [place], [place], [address] Phone: [phone]   Fax:  [phone]  Physical Therapy Treatment  Patient Details  Name: Jonathan Le MRN: [id number] Date of Birth: [birth year] Referring Provider: Elizabeth Palau, NP  Encounter Date: 03/30/2016      PT End of Session - 03/30/16 1452    Visit Number 2   Number of Visits 10   Date for PT Re-Evaluation 05/24/16   Authorization Type Use KX modifier on all visits   PT Start Time 1448   PT Stop Time 1530   PT Time Calculation (min) 42 min   Activity Tolerance Patient tolerated treatment well   Behavior During Therapy [facility] for tasks assessed/performed      Past Medical History  Diagnosis Date  . Anxiety   . Hypertension   . Arthritis   . Sleep apnea     wears CPAP nightly  . Glaucoma   . CHF (congestive heart failure) (HCC)     MI, with open heart surgery x 30 sdays ago per pt. (06/26/15)  . S/P CABG (coronary artery bypass graft)   . MI (myocardial infarction) (HCC)   . DVT (deep venous thrombosis) (HCC)   . Adult situational stress disorder   . Decubital ulcer   . GERD (gastroesophageal reflux disease)   . Anemia   . Chronic low back pain   . ED (erectile dysfunction)   . ETD (eustachian tube dysfunction)   . Ganglion cyst   . Ileostomy in place [facility])   . Osteoarthritis     Past Surgical History  Procedure Laterality Date  . Hernia repair      right  . Eye surgery      bil cataract  . Wisdom tooth extraction    . Fasciotomy foot / toe Left     foot  . Cardiac catheterization N/A 05/20/2015    Procedure: Left Heart Cath and Coronary Angiography;  Surgeon: Kathleene Hazel, MD;  Location: [facility] INVASIVE CV LAB;  Service: Cardiovascular;  Laterality: N/A;  . Coronary artery bypass graft N/A 05/22/2015    Procedure: CORONARY ARTERY BYPASS GRAFTING (CABG), ON PUMP, TIMES THREE, USING LEFT INTERNAL MAMMARY ARTERY, RIGHT GREATER  SAPHENOUS VEIN HARVESTED ENDOSCOPICALLY;  Surgeon: Kerin Perna, MD;  Location: [facility] OR;  Service: Open Heart Surgery;  Laterality: N/A;  LIMA-LAD, SVG-OM, SVG-PD  . Tee without cardioversion N/A 05/22/2015    Procedure: TRANSESOPHAGEAL ECHOCARDIOGRAM (TEE);  Surgeon: Kerin Perna, MD;  [facility] OR;  Service: Open Heart Surgery;  Laterality: N/A;  . Laparotomy N/A 06/18/2015    Procedure: EXPLORATORY LAPAROTOMY;  Surgeon: Emelia Loron, MD;  Location: [facility] OR;  Service: General;  Laterality: N/A;  . Laparotomy N/A 06/20/2015    Procedure: WASHOUT OF ABDOMEN, CLOSURE OF ABDOMINAL WALL WITH PLACEMENT OF WOUND VAC AND ILLEOSTOMY ;  Surgeon: Almond Lint, MD;  Location: [facility] OR;  Service: General;  Laterality: N/A;  . Tracheostomy tube placement N/A 07/03/2015    Procedure: TRACHEOSTOMY;  Surgeon: Serena Colonel, MD;  Location: [facility] OR;  Service: ENT;  Laterality: N/A;    There were no vitals filed for this visit.      Subjective Assessment - 03/30/16 1453    Subjective Feeling good today. I don't do pain.   Currently in Pain? No/denies   Multiple Pain Sites No  [facility] Adult PT Treatment/Exercise - 03/30/16 0001    Ambulation/Gait   Gait Comments 3:23 before needing to stop. This was 3 laps of the gym.    Knee/Hip Exercises: Stretches   Active Hamstring Stretch Both;3 reps;20 seconds   Knee/Hip Exercises: Aerobic   Nustep L1 x 6 min   Knee/Hip Exercises: Seated   Marching AROM;Strengthening;Both;2 sets;10 reps   Abd/Adduction Limitations Red band seated hip abd 10x    Sit to Sand 1 set;5 reps;with UE support  Has trouble usingmore LE vs UE   Ankle Exercises: Seated   Other Seated Ankle Exercises Rocker board ankle PF/DF 2 min                PT Education - 03/30/16 1507    Education provided Yes   Education Details Seated hamstring stretch   Person(s) Educated Patient;Spouse   Methods Explanation;Demonstration;Tactile cues;Verbal cues    Comprehension Verbalized understanding;Returned demonstration          PT Short Term Goals - 03/29/16 1539    PT SHORT TERM GOAL #1   Title be independent in initial HEP   Time 4   Period Weeks   Status New   PT SHORT TERM GOAL #2   Title improve LE strength to perform sit to stand with moderate UE support   Time 4   Period Weeks   Status New   PT SHORT TERM GOAL #3   Title perform TUG in < or = to 21 seconds to reduce risk of falls   Time 4   Period Weeks   Status New   PT SHORT TERM GOAL #4   Title walk for 10 minutes without significant fatigue   Time 4   Period Weeks   Status New           PT Long Term Goals - 03/29/16 1541    PT LONG TERM GOAL #1   Title be independent in advanced HEP   Time 8   Period Weeks   Status New   PT LONG TERM GOAL #2   Title improve strength and endurance to walk for 15 minutes without significant fatigue   Time 8   Period Weeks   Status New   PT LONG TERM GOAL #3   Title perform TUG in < or = to 18 seconds to reduce falls risk   Time 8   Period Weeks   Status New   PT LONG TERM GOAL #4   Title improve LE strength to perform sit to stand with minimal UE support   Time 8   Period Weeks   Status New   PT LONG TERM GOAL #5   Title wean from walker 50% of the time for home distances   Time 8   Period Weeks   Status New               Plan - 03/30/16 1453    Clinical Impression Statement First treatment pt tolerated very well. he is going to work on using his LE > UE when going from sit to stand. He was able to walk 3 min 23 seconds today before having to sit down.    Rehab Potential Good   PT Frequency 2x / week   PT Duration 8 weeks   PT Treatment/Interventions ADLs/Self Care Home Management;Cryotherapy;Moist Heat;Therapeutic exercise;Therapeutic activities;Functional mobility training;Stair training;Gait training;Neuromuscular re-education;Patient/family education;Manual techniques;Passive range of motion    Consulted and Agree with Plan of Care Patient  Patient will benefit from skilled therapeutic intervention in order to improve the following deficits and impairments:  Abnormal gait, Difficulty walking, Postural dysfunction, Decreased strength, Decreased mobility, Decreased balance, Impaired flexibility, Improper body mechanics, Decreased activity tolerance, Cardiopulmonary status limiting activity, Decreased endurance  Visit Diagnosis: Muscle weakness (generalized)  Difficulty in walking, not elsewhere classified  Abscess of abdominal cavity (HCC)  Right knee pain  Abscess       G-Codes - 03/29/16 1545    Functional Assessment Tool Used TUG 25 seconds   Functional Limitation Mobility: Walking and moving around   Mobility: Walking and Moving Around Current Status [phone]([id number]) At least 40 percent but less than 60 percent impaired, limited or restricted   Mobility: Walking and Moving Around Goal Status [phone]([id number]) At least 40 percent but less than 60 percent impaired, limited or restricted      Problem List Patient Active Problem List   Diagnosis Date Noted  . Fever 11/20/2015  . Cough 11/20/2015  . Hypoxemia   . Arterial hypotension   . Severe sepsis (HCC)   . Acute pulmonary edema (HCC)   . Congestive heart failure (HCC)   . Sepsis (HCC) 10/16/2015  . Warfarin-induced coagulopathy (HCC) 10/16/2015  . Anemia of chronic disease 10/16/2015  . Hyponatremia 10/16/2015  . Hypotension 10/16/2015  . Chronic combined systolic and diastolic congestive heart failure (HCC) 09/25/2015  . Tracheostomy care (HCC) 09/18/2015  . Chronic respiratory failure (HCC) 09/17/2015  . Abscess of abdominal cavity (HCC)   . Enterococcal infection   . Sacral decubitus ulcer   . Hypoalbuminemia due to protein-calorie malnutrition (HCC)   . 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 Intra-abdominal abscess (HCC)   . Debility 08/27/2015  . Acute on chronic  respiratory failure, unspecified whether with hypoxia or hypercapnia (HCC)   . Encounter for orogastric (OG) tube placement   . Abdominal abscess (HCC)   . Abscess   . Encounter for central line placement   . Encounter for intubation   . Respiratory failure (HCC)   . Septic shock (HCC)   . Blood poisoning (HCC)   . Tracheostomy status (HCC)   . Acute respiratory failure (HCC)   . Pressure ulcer 06/21/2015  . Shock (HCC) 06/18/2015  . Abdominal pain, generalized   . Shortness of breath   . Hypoxia   . 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 Cardiomyopathy, ischemic 05/21/2015  . CAD, multiple vessel 05/21/2015  . Acute systolic congestive heart failure, NYHA class 3 (HCC) 05/21/2015  . Morbid obesity (HCC) 05/20/2015  . OSA on CPAP 05/20/2015  . Other specified hypotension   . NSTEMI (non-ST elevated myocardial infarction) (HCC) 05/19/2015  . Benign prostatic hyperplasia with urinary obstruction 04/24/2015  . Chronic LBP 12/05/2013  . Essential hypertension 09/04/2013    Alessander Sikorski, PTA 03/30/2016, 5:02 PM  [facility] [address], [address] [place], [place], [address] Phone: [phone]   Fax:  [phone]  Name: Jonathan Le MRN: [id number] Date of Birth: 02/09/1946

## 2016-04-01 ENCOUNTER — Other Ambulatory Visit: Payer: Self-pay | Admitting: Cardiology

## 2016-04-01 DIAGNOSIS — R0600 Dyspnea, unspecified: Secondary | ICD-10-CM

## 2016-04-04 ENCOUNTER — Ambulatory Visit: Payer: Medicare Other | Admitting: Physical Therapy

## 2016-04-04 ENCOUNTER — Encounter: Payer: Self-pay | Admitting: Physical Therapy

## 2016-04-04 DIAGNOSIS — M6281 Muscle weakness (generalized): Secondary | ICD-10-CM | POA: Diagnosis not present

## 2016-04-04 DIAGNOSIS — L0291 Cutaneous abscess, unspecified: Secondary | ICD-10-CM

## 2016-04-04 DIAGNOSIS — K651 Peritoneal abscess: Secondary | ICD-10-CM

## 2016-04-04 DIAGNOSIS — M25561 Pain in right knee: Secondary | ICD-10-CM

## 2016-04-04 DIAGNOSIS — R262 Difficulty in walking, not elsewhere classified: Secondary | ICD-10-CM

## 2016-04-04 NOTE — Therapy (Signed)
[facility] [address] [place], [place], [address] Phone: [phone]   Fax:  [phone]  Physical Therapy Treatment  Patient Details  Name: Jonathan Le MRN: [id number] Date of Birth: 07/24/1946 Referring Provider: Elizabeth PalauAnderson, Teresa, NP  Encounter Date: 04/04/2016      PT End of Session - 04/04/16 1456    Visit Number 3   Number of Visits 10   Date for PT Re-Evaluation 05/24/16   Authorization Type Use KX modifier on all visits   PT Start Time 1450   PT Stop Time 1532   PT Time Calculation (min) 42 min   Activity Tolerance Patient tolerated treatment well   Behavior During Therapy [facility] for tasks assessed/performed      Past Medical History  Diagnosis Date  . Anxiety   . Hypertension   . Arthritis   . Sleep apnea     wears CPAP nightly  . Glaucoma   . CHF (congestive heart failure) (HCC)     MI, with open heart surgery x 30 sdays ago per pt. (06/26/15)  . S/P CABG (coronary artery bypass graft)   . MI (myocardial infarction) (HCC)   . DVT (deep venous thrombosis) (HCC)   . Adult situational stress disorder   . Decubital ulcer   . GERD (gastroesophageal reflux disease)   . Anemia   . Chronic low back pain   . ED (erectile dysfunction)   . ETD (eustachian tube dysfunction)   . Ganglion cyst   . Ileostomy in place [facility](HCC)   . Osteoarthritis     Past Surgical History  Procedure Laterality Date  . Hernia repair      right  . Eye surgery      bil cataract  . Wisdom tooth extraction    . Fasciotomy foot / toe Left     foot  . Cardiac catheterization N/A 05/20/2015    Procedure: Left Heart Cath and Coronary Angiography;  Surgeon: Kathleene Hazelhristopher D McAlhany, MD;  [facility];  Service: Cardiovascular;  Laterality: N/A;  . Coronary artery bypass graft N/A 05/22/2015    Procedure: CORONARY ARTERY BYPASS GRAFTING (CABG), ON PUMP, TIMES THREE, USING LEFT INTERNAL MAMMARY ARTERY, RIGHT GREATER  SAPHENOUS VEIN HARVESTED ENDOSCOPICALLY;  Surgeon: Kerin PernaPeter Van Trigt, MD;  [facility];  Service: Open Heart Surgery;  Laterality: N/A;  LIMA-LAD, SVG-OM, SVG-PD  . Tee without cardioversion N/A 05/22/2015    Procedure: TRANSESOPHAGEAL ECHOCARDIOGRAM (TEE);  Surgeon: Kerin PernaPeter Van Trigt, MD;  [facility];  Service: Open Heart Surgery;  Laterality: N/A;  . Laparotomy N/A 06/18/2015    Procedure: EXPLORATORY LAPAROTOMY;  Surgeon: Emelia LoronMatthew Wakefield, MD;  [facility];  Service: General;  Laterality: N/A;  . Laparotomy N/A 06/20/2015    Procedure: WASHOUT OF ABDOMEN, CLOSURE OF ABDOMINAL WALL WITH PLACEMENT OF WOUND VAC AND ILLEOSTOMY ;  Surgeon: Almond LintFaera Byerly, MD;  Location: [facility] OR;  Service: General;  Laterality: N/A;  . Tracheostomy tube placement N/A 07/03/2015    Procedure: TRACHEOSTOMY;  Surgeon: Serena ColonelJefry Rosen, MD;  [facility];  Service: ENT;  Laterality: N/A;    There were no vitals filed for this visit.      Subjective Assessment - 04/04/16 1454    Subjective I was really tired after last session, actually I was tired all weekend. Back of the right knee was sore.    Currently in Pain? Yes   Pain Score 5    Pain Orientation Right;Lower   Aggravating  Factors  Stretching the leg out   Pain Relieving Factors rest   Multiple Pain Sites No                         [facility] Adult PT Treatment/Exercise - 04/04/16 0001    Ambulation/Gait   Gait Comments 3:45 sec  Rt knee limits, painful.   Knee/Hip Exercises: Stretches   Active Hamstring Stretch Both;3 reps;20 seconds   Knee/Hip Exercises: Aerobic   Nustep L1 x 7 min   Knee/Hip Exercises: Seated   Long Arc Quad Strengthening;Both;2 sets;10 reps;Weights   Long Arc Quad Weight 2 lbs.   Abd/Adduction Limitations green band hip abd 2 x 10   Sit to Sand 1 set;with UE support  6 reps, VC to push with his legs   Ankle Exercises: Seated   Other Seated Ankle Exercises Rocker board ankle PF/DF 3 min                   PT Short Term Goals - 04/04/16 1504    PT SHORT TERM GOAL #1   Title be independent in initial HEP   Time 4   Period Weeks   Status Achieved           PT Long Term Goals - 03/29/16 1541    PT LONG TERM GOAL #1   Title be independent in advanced HEP   Time 8   Period Weeks   Status New   PT LONG TERM GOAL #2   Title improve strength and endurance to walk for 15 minutes without significant fatigue   Time 8   Period Weeks   Status New   PT LONG TERM GOAL #3   Title perform TUG in < or = to 18 seconds to reduce falls risk   Time 8   Period Weeks   Status New   PT LONG TERM GOAL #4   Title improve LE strength to perform sit to stand with minimal UE support   Time 8   Period Weeks   Status New   PT LONG TERM GOAL #5   Title wean from walker 50% of the time for home distances   Time 8   Period Weeks   Status New               Plan - 04/04/16 1456    Clinical Impression Statement Able to walk slightly longer time, RT knee pain limited any farther. Added weights to LAQ which did not bother his knees. Walker was again lifted up another level which has helped with his posture even more than last time he was in office.  Fairly complaint with HEP,.    Rehab Potential Good   PT Frequency 2x / week   PT Treatment/Interventions ADLs/Self Care Home Management;Cryotherapy;Moist Heat;Therapeutic exercise;Therapeutic activities;Functional mobility training;Stair training;Gait training;Neuromuscular re-education;Patient/family education;Manual techniques;Passive range of motion   Consulted and Agree with Plan of Care Patient      Patient will benefit from skilled therapeutic intervention in order to improve the following deficits and impairments:  Abnormal gait, Difficulty walking, Postural dysfunction, Decreased strength, Decreased mobility, Decreased balance, Impaired flexibility, Improper body mechanics, Decreased activity tolerance, Cardiopulmonary status limiting activity,  Decreased endurance  Visit Diagnosis: Muscle weakness (generalized)  Difficulty in walking, not elsewhere classified  Abscess of abdominal cavity (HCC)  Right knee pain  Abscess     Problem List Patient Active Problem List   Diagnosis Date Noted  . Fever 11/20/2015  .  Cough 11/20/2015  . Hypoxemia   . Arterial hypotension   . Severe sepsis (HCC)   . Acute pulmonary edema (HCC)   . Congestive heart failure (HCC)   . Sepsis (HCC) 10/16/2015  . Warfarin-induced coagulopathy (HCC) 10/16/2015  . Anemia of chronic disease 10/16/2015  . Hyponatremia 10/16/2015  . Hypotension 10/16/2015  . Chronic combined systolic and diastolic congestive heart failure (HCC) 09/25/2015  . Tracheostomy care (HCC) 09/18/2015  . Chronic respiratory failure (HCC) 09/17/2015  . Abscess of abdominal cavity (HCC)   . Enterococcal infection   . Sacral decubitus ulcer   . Hypoalbuminemia due to protein-calorie malnutrition (HCC)   . 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 Intra-abdominal abscess (HCC)   . Debility 08/27/2015  . Acute on chronic respiratory failure, unspecified whether with hypoxia or hypercapnia (HCC)   . Encounter for orogastric (OG) tube placement   . Abdominal abscess (HCC)   . Abscess   . Encounter for central line placement   . Encounter for intubation   . Respiratory failure (HCC)   . Septic shock (HCC)   . Blood poisoning (HCC)   . Tracheostomy status (HCC)   . Acute respiratory failure (HCC)   . Pressure ulcer 06/21/2015  . Shock (HCC) 06/18/2015  . Abdominal pain, generalized   . Shortness of breath   . Hypoxia   . 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 Cardiomyopathy, ischemic 05/21/2015  . CAD, multiple vessel 05/21/2015  . Acute systolic congestive heart failure, NYHA class 3 (HCC) 05/21/2015  . Morbid obesity (HCC) 05/20/2015  . OSA on CPAP 05/20/2015  . Other specified hypotension   . NSTEMI (non-ST elevated myocardial  infarction) (HCC) 05/19/2015  . Benign prostatic hyperplasia with urinary obstruction 04/24/2015  . Chronic LBP 12/05/2013  . Essential hypertension 09/04/2013    Sylvie Mifsud, PTA 04/04/2016, 3:35 PM  [facility] [address] [place], [place], [address] Phone: [phone]   Fax:  [phone]  Name: Jonathan Le MRN: [id number] Date of Birth: 1946-11-04

## 2016-04-06 ENCOUNTER — Encounter: Payer: Self-pay | Admitting: Physical Therapy

## 2016-04-06 ENCOUNTER — Ambulatory Visit: Payer: Medicare Other | Admitting: Physical Therapy

## 2016-04-06 DIAGNOSIS — M6281 Muscle weakness (generalized): Secondary | ICD-10-CM

## 2016-04-06 DIAGNOSIS — R262 Difficulty in walking, not elsewhere classified: Secondary | ICD-10-CM

## 2016-04-06 DIAGNOSIS — K651 Peritoneal abscess: Secondary | ICD-10-CM

## 2016-04-06 DIAGNOSIS — L0291 Cutaneous abscess, unspecified: Secondary | ICD-10-CM

## 2016-04-06 DIAGNOSIS — M25561 Pain in right knee: Secondary | ICD-10-CM

## 2016-04-06 NOTE — Therapy (Signed)
[facility] [address], [address] [place], [place], [address] Phone: [phone]   Fax:  [phone]  Physical Therapy Treatment  Patient Details  Name: Jonathan Le MRN: [id number] Date of Birth: [birth year] Referring Provider: Elizabeth Palau, NP  Encounter Date: 04/06/2016      PT End of Session - 04/06/16 1451    Visit Number 4   Number of Visits 10   Date for PT Re-Evaluation 05/24/16   Authorization Type Use KX modifier on all visits   PT Start Time 1445   PT Stop Time 1542   PT Time Calculation (min) 57 min   Activity Tolerance Patient tolerated treatment well   Behavior During Therapy [facility] for tasks assessed/performed      Past Medical History  Diagnosis Date  . Anxiety   . Hypertension   . Arthritis   . Sleep apnea     wears CPAP nightly  . Glaucoma   . CHF (congestive heart failure) (HCC)     MI, with open heart surgery x 30 sdays ago per pt. (06/26/15)  . S/P CABG (coronary artery bypass graft)   . MI (myocardial infarction) (HCC)   . DVT (deep venous thrombosis) (HCC)   . Adult situational stress disorder   . Decubital ulcer   . GERD (gastroesophageal reflux disease)   . Anemia   . Chronic low back pain   . ED (erectile dysfunction)   . ETD (eustachian tube dysfunction)   . Ganglion cyst   . Ileostomy in place [facility])   . Osteoarthritis     Past Surgical History  Procedure Laterality Date  . Hernia repair      right  . Eye surgery      bil cataract  . Wisdom tooth extraction    . Fasciotomy foot / toe Left     foot  . Cardiac catheterization N/A 05/20/2015    Procedure: Left Heart Cath and Coronary Angiography;  Surgeon: Kathleene Hazel, MD;  Location: [facility]VASIVE CV LAB;  Service: Cardiovascular;  Laterality: N/A;  . Coronary artery bypass graft N/A 05/22/2015    Procedure: CORONARY ARTERY BYPASS GRAFTING (CABG), ON PUMP, TIMES THREE, USING LEFT INTERNAL MAMMARY ARTERY, RIGHT GREATER  SAPHENOUS VEIN HARVESTED ENDOSCOPICALLY;  Surgeon: Kerin Perna, MD;  Location: [facility] OR;  Service: Open Heart Surgery;  Laterality: N/A;  LIMA-LAD, SVG-OM, SVG-PD  . Tee without cardioversion N/A 05/22/2015    Procedure: TRANSESOPHAGEAL ECHOCARDIOGRAM (TEE);  Surgeon: Kerin Perna, MD;  Location[facility];  Service: Open Heart Surgery;  Laterality: N/A;  . Laparotomy N/A 06/18/2015    Procedure: EXPLORATORY LAPAROTOMY;  Surgeon: Emelia Loron, MD;  Location: [facility] OR;  Service: General;  Laterality: N/A;  . Laparotomy N/A 06/20/2015    Procedure: WASHOUT OF ABDOMEN, CLOSURE OF ABDOMINAL WALL WITH PLACEMENT OF WOUND VAC AND ILLEOSTOMY ;  Surgeon: Almond Lint, MD;  Location: [facility] OR;  Service: General;  Laterality: N/A;  . Tracheostomy tube placement N/A 07/03/2015    Procedure: TRACHEOSTOMY;  Surgeon: Serena Colonel, MD;  Location: [facility] OR;  Service: ENT;  Laterality: N/A;    There were no vitals filed for this visit.      Subjective Assessment - 04/06/16 1451    Subjective My Rt knee is really bothering me today, probably the weather. Felt ok after last session.   Currently in Pain? Yes   Pain Score 6    Pain Location Knee   Pain Orientation Right   Pain  Descriptors / Indicators Sore;Aching   Multiple Pain Sites No                         [facility] Adult PT Treatment/Exercise - 04/06/16 0001    Ambulation/Gait   Gait Comments 4 min continuous with RW  Knee pain limiting factor   Knee/Hip Exercises: Stretches   Active Hamstring Stretch Both;3 reps;20 seconds   Knee/Hip Exercises: Aerobic   Nustep L1 x 8 min   Knee/Hip Exercises: Standing   Heel Raises --  5x barely could lift   Hip Abduction AROM;Right;1 set;10 reps;Knee straight  Cannot lift left leg secondary RT leg buckles   Knee/Hip Exercises: Seated   Long Arc Quad Strengthening;Both;2 sets;10 reps;Weights   Long Arc Quad Weight 2 lbs.   Ball Squeeze 30x   Marching AROM;Both;3 sets;10 reps   Abd/Adduction Limitations  green band hip abd 3 x 10   Cryotherapy   Number Minutes Cryotherapy 10 Minutes   Cryotherapy Location Knee   Type of Cryotherapy Ice pack                  PT Short Term Goals - 04/04/16 1504    PT SHORT TERM GOAL #1   Title be independent in initial HEP   Time 4   Period Weeks   Status Achieved           PT Long Term Goals - 03/29/16 1541    PT LONG TERM GOAL #1   Title be independent in advanced HEP   Time 8   Period Weeks   Status New   PT LONG TERM GOAL #2   Title improve strength and endurance to walk for 15 minutes without significant fatigue   Time 8   Period Weeks   Status New   PT LONG TERM GOAL #3   Title perform TUG in < or = to 18 seconds to reduce falls risk   Time 8   Period Weeks   Status New   PT LONG TERM GOAL #4   Title improve LE strength to perform sit to stand with minimal UE support   Time 8   Period Weeks   Status New   PT LONG TERM GOAL #5   Title wean from walker 50% of the time for home distances   Time 8   Period Weeks   Status New               Plan - 04/06/16 1452    Clinical Impression Statement attempted to add some standing exercises to session today. This was very difficult to do, pt could barely lift heels or lift the left leg into abduction secondary to RT leg knee pain and weakness.    Rehab Potential Good   PT Frequency 2x / week   PT Duration 8 weeks   PT Treatment/Interventions ADLs/Self Care Home Management;Cryotherapy;Moist Heat;Therapeutic exercise;Therapeutic activities;Functional mobility training;Stair training;Gait training;Neuromuscular re-education;Patient/family education;Manual techniques;Passive range of motion   PT Next Visit Plan Gait, hamstring stretch, endurance and strength tasks   Consulted and Agree with Plan of Care Patient      Patient will benefit from skilled therapeutic intervention in order to improve the following deficits and impairments:  Abnormal gait, Difficulty walking,  Postural dysfunction, Decreased strength, Decreased mobility, Decreased balance, Impaired flexibility, Improper body mechanics, Decreased activity tolerance, Cardiopulmonary status limiting activity, Decreased endurance  Visit Diagnosis: Muscle weakness (generalized)  Difficulty in walking, not elsewhere classified  Abscess  of abdominal cavity (HCC)  Right knee pain  Abscess     Problem List Patient Active Problem List   Diagnosis Date Noted  . Fever 11/20/2015  . Cough 11/20/2015  . Hypoxemia   . Arterial hypotension   . Severe sepsis (HCC)   . Acute pulmonary edema (HCC)   . Congestive heart failure (HCC)   . Sepsis (HCC) 10/16/2015  . Warfarin-induced coagulopathy (HCC) 10/16/2015  . Anemia of chronic disease 10/16/2015  . Hyponatremia 10/16/2015  . Hypotension 10/16/2015  . Chronic combined systolic and diastolic congestive heart failure (HCC) 09/25/2015  . Tracheostomy care (HCC) 09/18/2015  . Chronic respiratory failure (HCC) 09/17/2015  . Abscess of abdominal cavity (HCC)   . Enterococcal infection   . Sacral decubitus ulcer   . Hypoalbuminemia due to protein-calorie malnutrition (HCC)   . 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 Intra-abdominal abscess (HCC)   . Debility 08/27/2015  . Acute on chronic respiratory failure, unspecified whether with hypoxia or hypercapnia (HCC)   . Encounter for orogastric (OG) tube placement   . Abdominal abscess (HCC)   . Abscess   . Encounter for central line placement   . Encounter for intubation   . Respiratory failure (HCC)   . Septic shock (HCC)   . Blood poisoning (HCC)   . Tracheostomy status (HCC)   . Acute respiratory failure (HCC)   . Pressure ulcer 06/21/2015  . Shock (HCC) 06/18/2015  . Abdominal pain, generalized   . Shortness of breath   . Hypoxia   . 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 Cardiomyopathy, ischemic 05/21/2015  . CAD, multiple vessel 05/21/2015  . Acute  systolic congestive heart failure, NYHA class 3 (HCC) 05/21/2015  . Morbid obesity (HCC) 05/20/2015  . OSA on CPAP 05/20/2015  . Other specified hypotension   . NSTEMI (non-ST elevated myocardial infarction) (HCC) 05/19/2015  . Benign prostatic hyperplasia with urinary obstruction 04/24/2015  . Chronic LBP 12/05/2013  . Essential hypertension 09/04/2013    Shavone Nevers, PTA 04/06/2016, 3:33 PM  [facility] [address] [place], [place], [address] Phone: [phone]   Fax:  [phone]  Name: Jonathan Le MRN: [id number] Date of Birth: Feb 22, 1946

## 2016-04-08 ENCOUNTER — Encounter: Payer: Self-pay | Admitting: Cardiology

## 2016-04-08 DIAGNOSIS — I252 Old myocardial infarction: Secondary | ICD-10-CM | POA: Insufficient documentation

## 2016-04-08 DIAGNOSIS — G4730 Sleep apnea, unspecified: Secondary | ICD-10-CM | POA: Insufficient documentation

## 2016-04-08 DIAGNOSIS — Z86718 Personal history of other venous thrombosis and embolism: Secondary | ICD-10-CM | POA: Insufficient documentation

## 2016-04-08 DIAGNOSIS — Z7901 Long term (current) use of anticoagulants: Secondary | ICD-10-CM | POA: Insufficient documentation

## 2016-04-08 NOTE — Progress Notes (Signed)
Patient ID: TEREK BEE, male   DOB: [birth year], 70 y.o.   MRN: [id number]   [place], Pett    Date of visit:  02/29/2016 DOB:  [birth year]    Age:  70 yrs. Medical record number:  [id number]     Account number:  [id number] Primary Care Provider: Lajuana Carry ____________________________ CURRENT DIAGNOSES  1. Ischemic Cardiomyopathy  2. Dyspnea  3. Edema  4. Long term (current) use of anticoagulants  5. Sleep apnea  6. Essential hypertension  7. CAD Native without angina  8. Obesity ____________________________ ALLERGIES  Doxycycline, Rash, itching  Levaquin, Severe nausea & vomiting  Penicillins, Hives and/or rash  Unasyn, Hives and/or rash ____________________________ MEDICATIONS  1. Tylenol 325 mg tablet, prn  2. albuterol sulfate 2.5 mg/3 mL (0.083 %) solution for nebulization, prn  3. Voltaren 1 % topical gel, prn  4. Pepcid 20 mg tablet, BID  5. Proscar 5 mg tablet  6. Lasix 40 mg tablet, 1 p.o. daily  7. hydrocodone 5 mg-acetaminophen 325 mg tablet, Take as directed  8. magnesium oxide 400 mg tablet, Take as directed  9. multivitamin tablet, 1 p.o. daily  10. Protonix 40 mg tablet,delayed release, 1 p.o. daily  11. potassium chloride ER 10 mEq tablet,extended release, 1 p.o. daily  12. tamsulosin 0.4 mg capsule, Take as directed  13. warfarin 5 mg tablet, 1 p.o. daily ____________________________ CHIEF COMPLAINTS edema, cardiomyopathy ____________________________ HISTORY OF PRESENT ILLNESS This 70 year old male is seen to establish for cardiac care. The patient presented with what appeared to be a delayed presentation of an MI and May 18, 2016. He was found to have severe three-vessel disease underwent bypass grafting by Dr. Morton Peters. Several weeks later he was admitted with septic shock and found to have perforated his colon. He eventually underwent a total colectomy and has had a very complicated course with multiple abdominal abscesses multiple admissions and  treatment and eventually very prolonged rehabilitation stay. He recently got out of the nursing home in March. He has had significant edema since being out of the nursing home and has been placed back on Lasix. Evidently following his bypass grafting he was left with an ejection fraction of 25-30% but has not been assessed since last August. He is very weak in his legs and has to walk with a walker. He has had significant edema and has had a 10 pound weight gain over the past week. He was placed on warfarin and had some atrial fibrillation around the time of his serious illness. He evidently was told that he had a blood clot at some point in the past. He has symptoms of prostatism. He denies PND but does have some shortness of breath with exertion. He does not have clinical angina that I can tell. He was previously morbidly obese but has lost some weight. ____________________________ PAST HISTORY  Past Medical Illnesses:  hypertension, chronic pain syndrome, obesity, sleep apnea, BPH;  Cardiovascular Illnesses:  CAD, cardiomyopathy(ischemic), CHF;  Surgical Procedures:  CABG, colectomy-total, hernia repair, fasiotomy for heel spur;  NYHA Classification:  II;  Canadian Angina Classification:  Class 0: Asymptomatic;  Cardiology Procedures-Invasive:  cardiac cath (left) July 2016, CABG with LIMA to LAD< SVG to OM, SVG to RCA 05/22/15 Dr. Alla German, cardiac cath (left) July 2016;  Cardiology Procedures-Noninvasive:  echocardiogram August 2016;  Cardiac Cath Results:  normal Left main, occluded LAD, 95% stenosis distal RCA, 90% stenosis OM 2, 30% stenosis OM 3;  LVEF not documented,   ____________________________ CARDIO-PULMONARY TEST DATES  EKG Date:  02/29/2016;   Cardiac Cath Date:  05/20/2015;  CABG: 05/22/2015;   ____________________________ FAMILY HISTORY Father -- Father dead, Death due to natural cause Mother -- Death due to natural cause, Mother dead Sister -- Sister alive and  well ____________________________ SOCIAL HISTORY Alcohol Use:  no alcohol use;  Diet:  regular diet;  Lifestyle:  divorced, remarried and 3 children;  Exercise:  exercise is limited due to physical disability;  Occupation:  retired owned [employer];  Residence:  lives with wife;   ____________________________ REVIEW OF SYSTEMS General:  malaise and fatigue  Integumentary:no rashes or new skin lesions. Eyes: glaucoma Ears, Nose, Throat, Mouth:  denies any hearing loss, epistaxis, hoarseness or difficulty speaking. Respiratory: dyspnea Cardiovascular:  please review HPI Abdominal: denies dyspepsia, GI bleeding, constipation, or diarrhea Genitourinary-Male: frequency, nocturia  Musculoskeletal:  arthritis of the knees Neurological:  difficulty walking Psychiatric:  denies depression or anxiety Hematological/Immunologic:  denies any food allergies, bleeding disorders. ____________________________ PHYSICAL EXAMINATION VITAL SIGNS  Blood Pressure:  102/62 Sitting, Left arm, regular cuff   Pulse:  82/min. Respirations:  16/min. Weight:  253.00 lbs. Height:  69"BMI: 37  Constitutional:  pleasant white male in no acute distress, severely obese Skin:  warm and dry to touch, no apparent skin lesions, or masses noted. Head:  normocephalic, normal hair pattern, no masses or tenderness Eyes:  EOMS Intact, PERRLA, C and S clear, Funduscopic exam not done. ENT:  ears, nose and throat reveal no gross abnormalities.  Dentition good. Neck:  supple, without massess. No JVD, thyromegaly or carotid bruits. Carotid upstroke normal. Chest:  normal symmetry, clear to auscultation., healed median sternotomy scar Cardiac:  regular rhythm, normal S1 and S2, No S3 or S4, no murmurs, gallops or rubs detected. Abdomen:  abdomen soft,non-tender, no masses, no hepatospenomegaly, or aneurysm noted, colostomy present Peripheral Pulses:  the femoral,dorsalis pedis, and posterior tibial pulses are full and equal  bilaterally with no bruits auscultated. Extremities & Back:  3+ edema, bilateral lower extremity venous varicosities Neurological:  no gross motor or sensory deficits noted, affect appropriate, oriented x3. ____________________________ MOST RECENT LIPID PANEL 05/20/15  CHOL TOTL 121 mg/dl, LDL 69 NM, HDL 40 mg/dl and TRIGLYCER 61 mg/dl ____________________________ IMPRESSIONS/PLAN  1. Ischemic cardiomyopathy 2. Severe three-vessel coronary artery disease with previous bypass grafting 3. Long-term anticoagulation with warfarin 4. Marked peripheral edema unclear whether it represents venous insufficiency or decompensated heart failure 5. Chronic systolic heart failure  Recommendations:  Very difficult case with very complex medical records. At this point he needs to have his LV function reassessed. He is really not on any therapy for heart failure at this point. He is a markedly volume overloaded. After labs noted below and asked him to have a chest x-ray as well as an echocardiogram. He should have lower extremity Dopplers to evaluate his edema. I asked him to increase his furosemide to 40 mg twice daily and restrict his fluids to 2 quarts daily. Followup after echo.  ____________________________ TODAYS ORDERS  1. Chest X-ray PA/Lat: today  2. Comprehensive Metabolic Panel: Today  3. Complete Blood Count: Today  4. BNP: Today  5. TSH: Today  6. bilat L.E. Venous Doppler Insuff.: First Available  7. 2D, color flow, doppler and visit: First Available  8. 12 Lead EKG: Today                       ____________________________ Cardiology Physician:  Darden PalmerW. Spencer Tyrez Berrios, Jr. MD [facility]

## 2016-04-12 ENCOUNTER — Encounter: Payer: Medicare Other | Admitting: Internal Medicine

## 2016-04-12 NOTE — Progress Notes (Signed)
ELECTROPHYSIOLOGY CONSULT NOTE  Patient ID: Jonathan Le, MRN: [id number], DOB/AGE: [birth year] 70 y.o. Admit date: (Not on file) Date of Consult: 04/12/2016  Primary Physician: Elizabeth Palau, FNP Primary Cardiologist:  Consulting Physician   Chief Complaint:    HPI Jonathan Le is a 70 y.o. male    He has a history of coronary artery disease having presented with a non-STEMI 7/16. Echocardiogram at that time demonstrated EF 30-35%. Catheterization demonstrated severe three-vessel disease with occluded LAD OM disease and PDA disease.    Past Medical History  Diagnosis Date  . Anxiety   . Hypertension   . Arthritis   . Sleep apnea     wears CPAP nightly  . Glaucoma   . CHF (congestive heart failure) (HCC)     MI, with open heart surgery x 30 sdays ago per pt. (06/26/15)  . S/P CABG (coronary artery bypass graft)   . MI (myocardial infarction) (HCC)   . DVT (deep venous thrombosis) (HCC)   . Adult situational stress disorder   . Decubital ulcer   . GERD (gastroesophageal reflux disease)   . Anemia   . Chronic low back pain   . ED (erectile dysfunction)   . ETD (eustachian tube dysfunction)   . Ganglion cyst   . Ileostomy in place [facility])   . Osteoarthritis       Surgical History:  Past Surgical History  Procedure Laterality Date  . Hernia repair      right  . Eye surgery      bil cataract  . Wisdom tooth extraction    . Fasciotomy foot / toe Left     foot  . Cardiac catheterization N/A 05/20/2015    Procedure: Left Heart Cath and Coronary Angiography;  Surgeon: Kathleene Hazel, MD;  Location: [facility] INVASIVE CV LAB;  Service: Cardiovascular;  Laterality: N/A;  . Coronary artery bypass graft N/A 05/22/2015    Procedure: CORONARY ARTERY BYPASS GRAFTING (CABG), ON PUMP, TIMES THREE, USING LEFT INTERNAL MAMMARY ARTERY, RIGHT GREATER SAPHENOUS VEIN HARVESTED ENDOSCOPICALLY;  Surgeon: Kerin Perna, MD;  Location: [facility] OR;  Service: Open Heart Surgery;   Laterality: N/A;  LIMA-LAD, SVG-OM, SVG-PD  . Tee without cardioversion N/A 05/22/2015    Procedure: TRANSESOPHAGEAL ECHOCARDIOGRAM (TEE);  Surgeon: Kerin Perna, MD;  Location: [facility] OR;  Service: Open Heart Surgery;  Laterality: N/A;  . Laparotomy N/A 06/18/2015    Procedure: EXPLORATORY LAPAROTOMY;  Surgeon: Emelia Loron, MD;  Location[facility] OR;  Service: General;  Laterality: N/A;  . Laparotomy N/A 06/20/2015    Procedure: WASHOUT OF ABDOMEN, CLOSURE OF ABDOMINAL WALL WITH PLACEMENT OF WOUND VAC AND ILLEOSTOMY ;  Surgeon: Almond Lint, MD;  Location: [facility] OR;  Service: General;  Laterality: N/A;  . Tracheostomy tube placement N/A 07/03/2015    Procedure: TRACHEOSTOMY;  Surgeon: Serena Colonel, MD;  Location: [facility] OR;  Service: ENT;  Laterality: N/A;     Home Meds: Prior to Admission medications   Medication Sig Start Date End Date Taking? Authorizing Provider  acetaminophen (TYLENOL) 325 MG tablet Take 2 tablets (650 mg total) by mouth 2 (two) times daily with breakfast and lunch. 09/24/15   Evlyn Kanner Love, PA-C  albuterol (PROVENTIL) (2.5 MG/3ML) 0.083% nebulizer solution Take 3 mLs (2.5 mg total) by nebulization every 4 (four) hours as needed for wheezing. 11/23/15   Esperanza Sheets, MD  Amino Acids-Protein Hydrolys (FEEDING SUPPLEMENT, PRO-STAT SUGAR FREE 64,) LIQD Take 30 mLs by mouth 3 (three)  times daily. 09/24/15   Jacquelynn CreePamela S Love, PA-C  diclofenac sodium (VOLTAREN) 1 % GEL Apply 2 g topically 4 (four) times daily. 09/24/15   Jacquelynn CreePamela S Love, PA-C  famotidine (PEPCID) 20 MG tablet Take 1 tablet (20 mg total) by mouth 2 (two) times daily. 09/24/15   Jacquelynn CreePamela S Love, PA-C  finasteride (PROSCAR) 5 MG tablet Take 1 tablet (5 mg total) by mouth daily. 09/24/15   Jacquelynn CreePamela S Love, PA-C  furosemide (LASIX) 40 MG tablet Take 1 tablet (40 mg total) by mouth daily. 09/24/15   Jacquelynn CreePamela S Love, PA-C  HYDROcodone-acetaminophen (NORCO/VICODIN) 5-325 MG tablet Take 1 tablet by mouth every 6 (six) hours as needed for  moderate pain. 11/23/15   Esperanza SheetsUlugbek N Buriev, MD  magnesium oxide (MAG-OX) 400 (241.3 MG) MG tablet Take 2 tablets (800 mg total) by mouth 2 (two) times daily. 09/24/15   Jacquelynn CreePamela S Love, PA-C  Multiple Vitamins-Minerals (DECUBI-VITE PO) Take 1 tablet by mouth daily.    Historical Provider, MD  nutrition supplement, JUVEN, (JUVEN) PACK Take 1 packet by mouth daily.    Historical Provider, MD  pantoprazole (PROTONIX) 40 MG tablet Take 40 mg by mouth daily.    Historical Provider, MD  potassium chloride (K-DUR,KLOR-CON) 10 MEQ tablet Take 30 mEq by mouth 2 (two) times daily.    Historical Provider, MD  tamsulosin (FLOMAX) 0.4 MG CAPS capsule Take 1 capsule (0.4 mg total) by mouth daily after supper. 09/24/15   Jacquelynn CreePamela S Love, PA-C  warfarin (COUMADIN) 5 MG tablet Take 1 tablet (5 mg total) by mouth daily at 6 PM. 09/24/15   Jacquelynn CreePamela S Love, PA-C    Allergies:  Allergies  Allergen Reactions  . Unasyn [Ampicillin-Sulbactam Sodium] Hives, Itching and Rash    Diffuse rash not responding to Pepcid and Benadryl.  TDD.  Marland Kitchen. Levaquin [Levofloxacin] Nausea Only  . Penicillins Hives    MAR  . Doxycycline Rash    Social History   Social History  . Marital Status: Married    Spouse Name: N/A  . Number of Children: N/A  . Years of Education: N/A   Occupational History  . Not on file.   Social History Main Topics  . Smoking status: Never Smoker   . Smokeless tobacco: Never Used  . Alcohol Use: No  . Drug Use: No  . Sexual Activity: Not Currently   Other Topics Concern  . Not on file   Social History Narrative     Family History  Problem Relation Age of Onset  . Alzheimer's disease Father   . Rheum arthritis Mother      ROS:  Please see the history of present illness.     All other systems reviewed and negative.    Physical Exam: There were no vitals taken for this visit. General: Well developed, well nourished male in no acute distress. Head: Normocephalic, atraumatic, sclera  non-icteric, no xanthomas, nares are without discharge. EENT: normal  Lymph Nodes:  none Neck: Negative for carotid bruits. JVD not elevated. Back:without scoliosis kyphosis Lungs: Clear bilaterally to auscultation without wheezes, rales, or rhonchi. Breathing is unlabored. Heart: RRR with S1 S2. No  /6 systolic murmur . No rubs, or gallops appreciated. Abdomen: Soft, non-tender, non-distended with normoactive bowel sounds. No hepatomegaly. No rebound/guarding. No obvious abdominal masses. Msk:  Strength and tone appear normal for age. Extremities: No clubbing or cyanosis. No + edema.  Distal pedal pulses are 2+ and equal bilaterally. Skin: Warm and Dry Neuro: Alert and oriented  X 3. CN III-XII intact Grossly normal sensory and motor function . Psych:  Responds to questions appropriately with a normal affect.      Labs: Cardiac Enzymes No results for input(s): CKTOTAL, CKMB, TROPONINI in the last 72 hours. CBC Lab Results  Component Value Date   WBC 4.5 11/23/2015   HGB 10.5* 11/23/2015   HCT 33.9* 11/23/2015   MCV 91.6 11/23/2015   PLT 235 11/23/2015   PROTIME: No results for input(s): LABPROT, INR in the last 72 hours. Chemistry No results for input(s): NA, K, CL, CO2, BUN, CREATININE, CALCIUM, PROT, BILITOT, ALKPHOS, ALT, AST, GLUCOSE in the last 168 hours.  Invalid input(s): LABALBU Lipids Lab Results  Component Value Date   CHOL 121 05/20/2015   HDL 40* 05/20/2015   LDLCALC 69 05/20/2015   TRIG 137 06/30/2015   BNP No results found for: PROBNP Thyroid Function Tests: No results for input(s): TSH, T4TOTAL, T3FREE, THYROIDAB in the last 72 hours.  Invalid input(s): FREET3 Miscellaneous No results found for: DDIMER  Radiology/Studies:  No results found.  EKG:    Assessment and Plan:  Sherryl Manges

## 2016-04-13 ENCOUNTER — Encounter: Payer: Self-pay | Admitting: Physical Therapy

## 2016-04-13 ENCOUNTER — Ambulatory Visit: Payer: Medicare Other | Admitting: Physical Therapy

## 2016-04-13 DIAGNOSIS — R262 Difficulty in walking, not elsewhere classified: Secondary | ICD-10-CM

## 2016-04-13 DIAGNOSIS — L0291 Cutaneous abscess, unspecified: Secondary | ICD-10-CM

## 2016-04-13 DIAGNOSIS — K651 Peritoneal abscess: Secondary | ICD-10-CM

## 2016-04-13 DIAGNOSIS — M25561 Pain in right knee: Secondary | ICD-10-CM

## 2016-04-13 DIAGNOSIS — M6281 Muscle weakness (generalized): Secondary | ICD-10-CM | POA: Diagnosis not present

## 2016-04-13 NOTE — Therapy (Signed)
[facility] [address] [place], [place], [address] Phone: [phone]   Fax:  [phone]  Physical Therapy Treatment  Patient Details  Name: Jonathan Le MRN: [id number] Date of Birth: 1946-06-27 Referring Provider: Elizabeth Palau, NP  Encounter Date: 04/13/2016      PT End of Session - 04/13/16 1454    Visit Number 5   Number of Visits 10   Date for PT Re-Evaluation 05/24/16   Authorization Type Use KX modifier on all visits   PT Start Time 1450   PT Stop Time 1545   PT Time Calculation (min) 55 min   Equipment Utilized During Treatment Gait belt   Activity Tolerance Patient tolerated treatment well   Behavior During Therapy [facility] for tasks assessed/performed      Past Medical History  Diagnosis Date  . Anxiety   . Hypertension   . Arthritis   . Sleep apnea     wears CPAP nightly  . Glaucoma   . CHF (congestive heart failure) (HCC)     MI, with open heart surgery x 30 sdays ago per pt. (06/26/15)  . S/P CABG (coronary artery bypass graft)   . MI (myocardial infarction) (HCC)   . DVT (deep venous thrombosis) (HCC)   . Adult situational stress disorder   . Decubital ulcer   . GERD (gastroesophageal reflux disease)   . Anemia   . Chronic low back pain   . ED (erectile dysfunction)   . ETD (eustachian tube dysfunction)   . Ganglion cyst   . Ileostomy in place [facility])   . Osteoarthritis     Past Surgical History  Procedure Laterality Date  . Hernia repair      right  . Eye surgery      bil cataract  . Wisdom tooth extraction    . Fasciotomy foot / toe Left     foot  . Cardiac catheterization N/A 05/20/2015    Procedure: Left Heart Cath and Coronary Angiography;  Surgeon: Kathleene Hazel, MD;  Location: [facility] INVASIVE CV LAB;  Service: Cardiovascular;  Laterality: N/A;  . Coronary artery bypass graft N/A 05/22/2015    Procedure: CORONARY ARTERY BYPASS GRAFTING (CABG), ON PUMP, TIMES THREE,  USING LEFT INTERNAL MAMMARY ARTERY, RIGHT GREATER SAPHENOUS VEIN HARVESTED ENDOSCOPICALLY;  Surgeon: Kerin Perna, MD;  Location: [facility] OR;  Service: Open Heart Surgery;  Laterality: N/A;  LIMA-LAD, SVG-OM, SVG-PD  . Tee without cardioversion N/A 05/22/2015    Procedure: TRANSESOPHAGEAL ECHOCARDIOGRAM (TEE);  Surgeon: Kerin Perna, MD;  Location: [facility] OR;  Service: Open Heart Surgery;  Laterality: N/A;  . Laparotomy N/A 06/18/2015    Procedure: EXPLORATORY LAPAROTOMY;  Surgeon: Emelia Loron, MD;  Location: [facility] OR;  Service: General;  Laterality: N/A;  . Laparotomy N/A 06/20/2015    Procedure: WASHOUT OF ABDOMEN, CLOSURE OF ABDOMINAL WALL WITH PLACEMENT OF WOUND VAC AND ILLEOSTOMY ;  Surgeon: Almond Lint, MD;  Location: [facility] OR;  Service: General;  Laterality: N/A;  . Tracheostomy tube placement N/A 07/03/2015    Procedure: TRACHEOSTOMY;  Surgeon: Serena Colonel, MD;  Location: [facility] OR;  Service: ENT;  Laterality: N/A;    There were no vitals filed for this visit.      Subjective Assessment - 04/13/16 1455    Subjective Ice helped my knee. I felt ok after therapy, last night my knee was bothering me. Having some tests next week to see if he has any PE or need a defribilator.  Currently in Pain? Yes   Pain Score 7    Pain Location Knee   Aggravating Factors  It has been fairly constant lately   Pain Relieving Factors rest, ice   Multiple Pain Sites No                         [facility] Adult PT Treatment/Exercise - 04/13/16 0001    Ambulation/Gait   Gait Comments 3 min first set:  second set 3 min 15 sec   Knee/Hip Exercises: Aerobic   Nustep L1 x 9 min   Knee/Hip Exercises: Seated   Long Arc Quad Strengthening;Both;2 sets;10 reps;Weights   Ball Squeeze 30x   Marching Strengthening;Both;3 sets;10 reps   [employer] 2 lbs.   Abd/Adduction Limitations blue band hip abd 3 x 10   Sit to Sand 1 set;with UE support  8 reps, VC to push with his legs   Cryotherapy   Number  Minutes Cryotherapy 10 Minutes   Cryotherapy Location Knee   Type of Cryotherapy Ice pack   Ankle Exercises: Seated   Other Seated Ankle Exercises Rocker board ankle PF/DF 3 min                  PT Short Term Goals - 04/13/16 1521    PT SHORT TERM GOAL #2   Title improve LE strength to perform sit to stand with moderate UE support   Time 4   Period Weeks   Status Achieved   PT SHORT TERM GOAL #4   Title walk for 10 minutes without significant fatigue   Time 4   Period Weeks   Status On-going  No more than 3 min at a time           PT Long Term Goals - 03/29/16 1541    PT LONG TERM GOAL #1   Title be independent in advanced HEP   Time 8   Period Weeks   Status New   PT LONG TERM GOAL #2   Title improve strength and endurance to walk for 15 minutes without significant fatigue   Time 8   Period Weeks   Status New   PT LONG TERM GOAL #3   Title perform TUG in < or = to 18 seconds to reduce falls risk   Time 8   Period Weeks   Status New   PT LONG TERM GOAL #4   Title improve LE strength to perform sit to stand with minimal UE support   Time 8   Period Weeks   Status New   PT LONG TERM GOAL #5   Title wean from walker 50% of the time for home distances   Time 8   Period Weeks   Status New               Plan - 04/13/16 1521    Rehab Potential Good   PT Frequency 2x / week   PT Duration 8 weeks   PT Treatment/Interventions ADLs/Self Care Home Management;Cryotherapy;Moist Heat;Therapeutic exercise;Therapeutic activities;Functional mobility training;Stair training;Gait training;Neuromuscular re-education;Patient/family education;Manual techniques;Passive range of motion   Consulted and Agree with Plan of Care Patient      Patient will benefit from skilled therapeutic intervention in order to improve the following deficits and impairments:  Abnormal gait, Difficulty walking, Postural dysfunction, Decreased strength, Decreased mobility, Decreased  balance, Impaired flexibility, Improper body mechanics, Decreased activity tolerance, Cardiopulmonary status limiting activity, Decreased endurance  Visit Diagnosis: Muscle weakness (generalized)  Difficulty in walking, not elsewhere classified  Abscess of abdominal cavity (HCC)  Right knee pain  Abscess     Problem List Patient Active Problem List   Diagnosis Date Noted  . History of DVT (deep vein thrombosis)   . Long-term (current) use of anticoagulants   . Old anterior myocardial infarction   . Sleep apnea   . Anemia of chronic disease 10/16/2015  . Chronic combined systolic and diastolic congestive heart failure (HCC) 09/25/2015  . Chronic respiratory failure (HCC) 09/17/2015  . 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 Ischemic cardiomyopathy   . CAD (coronary artery disease), native coronary artery   . Morbid obesity (HCC) 05/20/2015  . Benign prostatic hyperplasia with urinary obstruction 04/24/2015  . Chronic LBP 12/05/2013  . Essential hypertension 09/04/2013    Brain Honeycutt, PTA 04/13/2016, 3:36 PM   [facility] [address] [place], [place], [address] Phone: [phone]   Fax:  [phone]  Name: Ardyth HarpsHarold D Quinney MRN: [id number] Date of Birth: 06/03/1946

## 2016-04-14 ENCOUNTER — Encounter (HOSPITAL_COMMUNITY): Payer: Medicare Other

## 2016-04-14 ENCOUNTER — Encounter: Payer: Self-pay | Admitting: Internal Medicine

## 2016-04-14 ENCOUNTER — Ambulatory Visit (HOSPITAL_COMMUNITY): Payer: Medicare Other

## 2016-04-15 ENCOUNTER — Encounter: Payer: Self-pay | Admitting: Physical Therapy

## 2016-04-15 ENCOUNTER — Ambulatory Visit: Payer: Medicare Other | Attending: Nurse Practitioner | Admitting: Physical Therapy

## 2016-04-15 DIAGNOSIS — K651 Peritoneal abscess: Secondary | ICD-10-CM | POA: Insufficient documentation

## 2016-04-15 DIAGNOSIS — L0291 Cutaneous abscess, unspecified: Secondary | ICD-10-CM | POA: Insufficient documentation

## 2016-04-15 DIAGNOSIS — M6281 Muscle weakness (generalized): Secondary | ICD-10-CM | POA: Insufficient documentation

## 2016-04-15 DIAGNOSIS — M25561 Pain in right knee: Secondary | ICD-10-CM | POA: Insufficient documentation

## 2016-04-15 DIAGNOSIS — R262 Difficulty in walking, not elsewhere classified: Secondary | ICD-10-CM | POA: Diagnosis present

## 2016-04-15 NOTE — Therapy (Addendum)
[facility] [address], [address] [place], [place], [address] Phone: [phone]   Fax:  [phone]  Physical Therapy Treatment  Patient Details  Name: Jonathan Le MRN: [id number] Date of Birth: Jan 19, 1946 Referring Provider: Elizabeth Palau, NP  Encounter Date: 04/15/2016      PT End of Session - 04/15/16 1027    Visit Number 6   Number of Visits 10   Date for PT Re-Evaluation 05/24/16   Authorization Type Use KX modifier on all visits   PT Start Time 1012   PT Stop Time 1108   PT Time Calculation (min) 56 min   Activity Tolerance Patient tolerated treatment well   Behavior During Therapy [facility] for tasks assessed/performed      Past Medical History  Diagnosis Date  . Anxiety   . Hypertension   . Arthritis   . Sleep apnea     wears CPAP nightly  . Glaucoma   . CHF (congestive heart failure) (HCC)     MI, with open heart surgery x 30 sdays ago per pt. (06/26/15)  . S/P CABG (coronary artery bypass graft)   . MI (myocardial infarction) (HCC)   . DVT (deep venous thrombosis) (HCC)   . Adult situational stress disorder   . Decubital ulcer   . GERD (gastroesophageal reflux disease)   . Anemia   . Chronic low back pain   . ED (erectile dysfunction)   . ETD (eustachian tube dysfunction)   . Ganglion cyst   . Ileostomy in place [facility])   . Osteoarthritis     Past Surgical History  Procedure Laterality Date  . Hernia repair      right  . Eye surgery      bil cataract  . Wisdom tooth extraction    . Fasciotomy foot / toe Left     foot  . Cardiac catheterization N/A 05/20/2015    Procedure: Left Heart Cath and Coronary Angiography;  Surgeon: Kathleene Hazel, MD;  Location: [facility] INVASIVE CV LAB;  Service: Cardiovascular;  Laterality: N/A;  . Coronary artery bypass graft N/A 05/22/2015    Procedure: CORONARY ARTERY BYPASS GRAFTING (CABG), ON PUMP, TIMES THREE, USING LEFT INTERNAL MAMMARY ARTERY, RIGHT GREATER  SAPHENOUS VEIN HARVESTED ENDOSCOPICALLY;  Surgeon: Kerin Perna, MD;  Location: [facility] OR;  Service: Open Heart Surgery;  Laterality: N/A;  LIMA-LAD, SVG-OM, SVG-PD  . Tee without cardioversion N/A 05/22/2015    Procedure: TRANSESOPHAGEAL ECHOCARDIOGRAM (TEE);  Surgeon: Kerin Perna, MD;  Location: [facility] OR;  Service: Open Heart Surgery;  Laterality: N/A;  . Laparotomy N/A 06/18/2015    Procedure: EXPLORATORY LAPAROTOMY;  Surgeon: Emelia Loron, MD;  Location: [facility] OR;  Service: General;  Laterality: N/A;  . Laparotomy N/A 06/20/2015    Procedure: WASHOUT OF ABDOMEN, CLOSURE OF ABDOMINAL WALL WITH PLACEMENT OF WOUND VAC AND ILLEOSTOMY ;  Surgeon: Almond Lint, MD;  Location: [facility] OR;  Service: General;  Laterality: N/A;  . Tracheostomy tube placement N/A 07/03/2015    Procedure: TRACHEOSTOMY;  Surgeon: Serena Colonel, MD;  Location: [facility] OR;  Service: ENT;  Laterality: N/A;    There were no vitals filed for this visit.      Subjective Assessment - 04/15/16 1021    Subjective Pt reports the Rt knee is stiff and hurts rated as 6/10, the left knee is stiff and rated as 2/10   Pertinent History Heart attack: 05/17/15 with CAGB , In and out of hospital and rehab over the past  215 days due to medial complications.  Recent onset of Rt shoulder pain   Limitations Walking;Standing   How long can you stand comfortably? 5 minutes with UE support   How long can you walk comfortably? 10 minutes with walker   Patient Stated Goals improve strength and endurance, stand longer    Currently in Pain? Yes   Pain Score 6    Pain Location Knee   Pain Orientation Right   Pain Descriptors / Indicators Aching;Sore   Pain Type Acute pain   Pain Radiating Towards none   Pain Onset More than a month ago   Pain Frequency Intermittent   Aggravating Factors  walking is limited due to stiffness, and fatique in knee's   Pain Relieving Factors rest, ice   Multiple Pain Sites No   Pain Location Knee   Pain Orientation Left   Pain  Type Chronic pain   Pain Frequency Intermittent            [facility] PT Assessment - 04/15/16 0001    Balance   Balance Assessed Yes   Standardized Balance Assessment   Standardized Balance Assessment Timed Up and Go Test   Timed Up and Go Test   TUG Normal TUG   Normal TUG (seconds) 16                     [facility] Adult PT Treatment/Exercise - 04/15/16 0001    Ambulation/Gait   Gait Comments --   Knee/Hip Exercises: Aerobic   Nustep L1 x , 0.45 mpH   Knee/Hip Exercises: Seated   Long Arc Quad 15 reps;Weights  3#   Long Arc Quad Weight 3 lbs.   Ball Squeeze 30x   Marching Strengthening;Both;3 sets;10 reps   [employer] 3 lbs.   Sit to Sand 1 set;with UE support  challenging due to weakness   Cryotherapy   Number Minutes Cryotherapy 10 Minutes   Cryotherapy Location Knee  bil elevated in supine   Type of Cryotherapy Ice pack   Ankle Exercises: Seated   Other Seated Ankle Exercises Rocker board ankle PF/DF 3 min                  PT Short Term Goals - 04/15/16 1056    PT SHORT TERM GOAL #1   Title be independent in initial HEP   Time 4   Period Weeks   Status Achieved   PT SHORT TERM GOAL #2   Title improve LE strength to perform sit to stand with moderate UE support   Time 4   Period Weeks   Status Achieved   PT SHORT TERM GOAL #3   Title perform TUG in < or = to 21 seconds to reduce risk of falls   Time 4   Period Weeks   Status Achieved   PT SHORT TERM GOAL #4   Title walk for 10 minutes without significant fatigue   Time 4   Period Weeks   Status On-going           PT Long Term Goals - 03/29/16 1541    PT LONG TERM GOAL #1   Title be independent in advanced HEP   Time 8   Period Weeks   Status New   PT LONG TERM GOAL #2   Title improve strength and endurance to walk for 15 minutes without significant fatigue   Time 8   Period Weeks   Status New   PT LONG TERM GOAL #  3   Title perform TUG in < or = to 18 seconds  to reduce falls risk   Time 8   Period Weeks   Status New   PT LONG TERM GOAL #4   Title improve LE strength to perform sit to stand with minimal UE support   Time 8   Period Weeks   Status New   PT LONG TERM GOAL #5   Title wean from walker 50% of the time for home distances   Time 8   Period Weeks   Status New               Plan - 04/15/16 1032    Clinical Impression Statement Right knee joint pain and stiffness and overall weakness limits patients walking distance. Pt will continue to benefit from skilled PT toimprove strength and endurance and control pain.    Rehab Potential Good   PT Frequency 2x / week   PT Duration 8 weeks   PT Treatment/Interventions ADLs/Self Care Home Management;Cryotherapy;Moist Heat;Therapeutic exercise;Therapeutic activities;Functional mobility training;Stair training;Gait training;Neuromuscular re-education;Patient/family education;Manual techniques;Passive range of motion   PT Next Visit Plan Seated LE exercises, may try standing exercises.    Consulted and Agree with Plan of Care Patient      Patient will benefit from skilled therapeutic intervention in order to improve the following deficits and impairments:  Abnormal gait, Difficulty walking, Postural dysfunction, Decreased strength, Decreased mobility, Decreased balance, Impaired flexibility, Improper body mechanics, Decreased activity tolerance, Cardiopulmonary status limiting activity, Decreased endurance  Visit Diagnosis: Muscle weakness (generalized)  Difficulty in walking, not elsewhere classified  Abscess of abdominal cavity (HCC)  Right knee pain  Abscess     Problem List Patient Active Problem List   Diagnosis Date Noted  . History of DVT (deep vein thrombosis)   . Long-term (current) use of anticoagulants   . Old anterior myocardial infarction   . Sleep apnea   . Anemia of chronic disease 10/16/2015  . Chronic combined systolic and diastolic congestive heart  failure (HCC) 09/25/2015  . Chronic respiratory failure (HCC) 09/17/2015  . 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 Ischemic cardiomyopathy   . CAD (coronary artery disease), native coronary artery   . Morbid obesity (HCC) 05/20/2015  . Benign prostatic hyperplasia with urinary obstruction 04/24/2015  . Chronic LBP 12/05/2013  . Essential hypertension 09/04/2013    NAUMANN-HOUEGNIFIO,Courtland Coppa PTA 04/15/2016, 11:04 AM  [facility] [address], [address] [place], [place], [address] Phone: [phone]   Fax:  [phone]  Name: Ardyth HarpsHarold D Mcginley MRN: [id number] Date of Birth: 12/26/1945

## 2016-04-18 ENCOUNTER — Encounter: Payer: Self-pay | Admitting: Physical Therapy

## 2016-04-18 ENCOUNTER — Ambulatory Visit: Payer: Medicare Other | Admitting: Physical Therapy

## 2016-04-18 DIAGNOSIS — M25561 Pain in right knee: Secondary | ICD-10-CM

## 2016-04-18 DIAGNOSIS — K651 Peritoneal abscess: Secondary | ICD-10-CM

## 2016-04-18 DIAGNOSIS — M6281 Muscle weakness (generalized): Secondary | ICD-10-CM | POA: Diagnosis not present

## 2016-04-18 DIAGNOSIS — L0291 Cutaneous abscess, unspecified: Secondary | ICD-10-CM

## 2016-04-18 DIAGNOSIS — R262 Difficulty in walking, not elsewhere classified: Secondary | ICD-10-CM

## 2016-04-18 NOTE — Therapy (Signed)
[facility] [address], [address] [place], [place], [address] Phone: [phone]   Fax:  [phone]  Physical Therapy Treatment  Patient Details  Name: Jonathan Le MRN: [id number] Date of Birth: 02-27-1946 Referring Provider: Elizabeth Palau, NP  Encounter Date: 04/18/2016      PT End of Session - 04/18/16 1455    Visit Number 7   Number of Visits 10   Date for PT Re-Evaluation 05/24/16   Authorization Type Use KX modifier on all visits   PT Start Time 1452   PT Stop Time 1545   PT Time Calculation (min) 53 min   Activity Tolerance Patient tolerated treatment well   Behavior During Therapy [facility] for tasks assessed/performed      Past Medical History  Diagnosis Date  . Anxiety   . Hypertension   . Arthritis   . Sleep apnea     wears CPAP nightly  . Glaucoma   . CHF (congestive heart failure) (HCC)     MI, with open heart surgery x 30 sdays ago per pt. (06/26/15)  . S/P CABG (coronary artery bypass graft)   . MI (myocardial infarction) (HCC)   . DVT (deep venous thrombosis) (HCC)   . Adult situational stress disorder   . Decubital ulcer   . GERD (gastroesophageal reflux disease)   . Anemia   . Chronic low back pain   . ED (erectile dysfunction)   . ETD (eustachian tube dysfunction)   . Ganglion cyst   . Ileostomy in place [facility])   . Osteoarthritis     Past Surgical History  Procedure Laterality Date  . Hernia repair      right  . Eye surgery      bil cataract  . Wisdom tooth extraction    . Fasciotomy foot / toe Left     foot  . Cardiac catheterization N/A 05/20/2015    Procedure: Left Heart Cath and Coronary Angiography;  Surgeon: Kathleene Hazel, MD;  Location: [facility] INVASIVE CV LAB;  Service: Cardiovascular;  Laterality: N/A;  . Coronary artery bypass graft N/A 05/22/2015    Procedure: CORONARY ARTERY BYPASS GRAFTING (CABG), ON PUMP, TIMES THREE, USING LEFT INTERNAL MAMMARY ARTERY, RIGHT GREATER  SAPHENOUS VEIN HARVESTED ENDOSCOPICALLY;  Surgeon: Kerin Perna, MD;  Location: [facility] OR;  Service: Open Heart Surgery;  Laterality: N/A;  LIMA-LAD, SVG-OM, SVG-PD  . Tee without cardioversion N/A 05/22/2015    Procedure: TRANSESOPHAGEAL ECHOCARDIOGRAM (TEE);  Surgeon: Kerin Perna, MD;  Location: [facility] OR;  Service: Open Heart Surgery;  Laterality: N/A;  . Laparotomy N/A 06/18/2015    Procedure: EXPLORATORY LAPAROTOMY;  Surgeon: Emelia Loron, MD;  Location: [facility] OR;  Service: General;  Laterality: N/A;  . Laparotomy N/A 06/20/2015    Procedure: WASHOUT OF ABDOMEN, CLOSURE OF ABDOMINAL WALL WITH PLACEMENT OF WOUND VAC AND ILLEOSTOMY ;  Surgeon: Almond Lint, MD;  Location: [facility] OR;  Service: General;  Laterality: N/A;  . Tracheostomy tube placement N/A 07/03/2015    Procedure: TRACHEOSTOMY;  Surgeon: Serena Colonel, MD;  Location: [facility] OR;  Service: ENT;  Laterality: N/A;    There were no vitals filed for this visit.                       [facility] Adult PT Treatment/Exercise - 04/18/16 0001    Ambulation/Gait   Gait Comments 3:22 first round of walking with RW, Second trip 4:00  Knee pain   Knee/Hip Exercises: Aerobic  Nustep L1 x , 0.45 mpH   Knee/Hip Exercises: Seated   Long Arc Quad Strengthening;Both;3 sets;10 reps   Long Arc Quad Weight 3 lbs.   Ball Squeeze 30x   Marching Strengthening;Both;3 sets;10 reps;Weights   Marching Weights 3 lbs.   Abd/Adduction Limitations blue band hip abd 3 x 10   Cryotherapy   Number Minutes Cryotherapy 10 Minutes   Cryotherapy Location Knee  bil elevated in supine   Type of Cryotherapy Ice pack   Ankle Exercises: Seated   Other Seated Ankle Exercises Rocker board ankle PF/DF 3 min                  PT Short Term Goals - 04/18/16 1456    PT SHORT TERM GOAL #4   Title walk for 10 minutes without significant fatigue   Time 4   Period Weeks   Status On-going           PT Long Term Goals - 03/29/16 1541    PT LONG  TERM GOAL #1   Title be independent in advanced HEP   Time 8   Period Weeks   Status New   PT LONG TERM GOAL #2   Title improve strength and endurance to walk for 15 minutes without significant fatigue   Time 8   Period Weeks   Status New   PT LONG TERM GOAL #3   Title perform TUG in < or = to 18 seconds to reduce falls risk   Time 8   Period Weeks   Status New   PT LONG TERM GOAL #4   Title improve LE strength to perform sit to stand with minimal UE support   Time 8   Period Weeks   Status New   PT LONG TERM GOAL #5   Title wean from walker 50% of the time for home distances   Time 8   Period Weeks   Status New               Plan - 04/18/16 1455    Clinical Impression Statement Rt knee pain remains most limiting for pt's walking. Able to walk longer but just slightly today. pt contihues to do well with all other exercises, still not safe for standing exercises.    Rehab Potential Good   PT Frequency 2x / week   PT Duration 8 weeks   PT Treatment/Interventions ADLs/Self Care Home Management;Cryotherapy;Moist Heat;Therapeutic exercise;Therapeutic activities;Functional mobility training;Stair training;Gait training;Neuromuscular re-education;Patient/family education;Manual techniques;Passive range of motion   PT Next Visit Plan Pt going tomorrow to see MD regarding blood clots in his lungs.   Consulted and Agree with Plan of Care Patient      Patient will benefit from skilled therapeutic intervention in order to improve the following deficits and impairments:  Abnormal gait, Difficulty walking, Postural dysfunction, Decreased strength, Decreased mobility, Decreased balance, Impaired flexibility, Improper body mechanics, Decreased activity tolerance, Cardiopulmonary status limiting activity, Decreased endurance  Visit Diagnosis: Muscle weakness (generalized)  Difficulty in walking, not elsewhere classified  Abscess of abdominal cavity (HCC)  Right knee  pain  Abscess     Problem List Patient Active Problem List   Diagnosis Date Noted  . History of DVT (deep vein thrombosis)   . Long-term (current) use of anticoagulants   . Old anterior myocardial infarction   . Sleep apnea   . Anemia of chronic disease 10/16/2015  . Chronic combined systolic and diastolic congestive heart failure (HCC) 09/25/2015  . Chronic respiratory failure (  HCC) 09/17/2015  . 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 Ischemic cardiomyopathy   . CAD (coronary artery disease), native coronary artery   . Morbid obesity (HCC) 05/20/2015  . Benign prostatic hyperplasia with urinary obstruction 04/24/2015  . Chronic LBP 12/05/2013  . Essential hypertension 09/04/2013    Yong Wahlquist, PTA 04/18/2016, 3:33 PM  [facility] [address], [address] [place], [place], [address] Phone: [phone]   Fax:  [phone]  Name: [name] MRN: [id number] Date of Birth: 07/28/1946

## 2016-04-19 ENCOUNTER — Ambulatory Visit (HOSPITAL_COMMUNITY)
Admission: RE | Admit: 2016-04-19 | Discharge: 2016-04-19 | Disposition: A | Discharge status: Home / Self Care | Payer: Medicare Other | Source: Ambulatory Visit | Attending: Cardiology | Admitting: Cardiology

## 2016-04-19 DIAGNOSIS — I517 Cardiomegaly: Secondary | ICD-10-CM | POA: Diagnosis not present

## 2016-04-19 DIAGNOSIS — R0600 Dyspnea, unspecified: Secondary | ICD-10-CM

## 2016-04-19 DIAGNOSIS — R918 Other nonspecific abnormal finding of lung field: Secondary | ICD-10-CM | POA: Insufficient documentation

## 2016-04-19 MED ORDER — TECHNETIUM TO 99M ALBUMIN AGGREGATED
4.1000 | Freq: Once | INTRAVENOUS | Status: AC | PRN
  Administered 2016-04-19: 4 via INTRAVENOUS

## 2016-04-19 MED ORDER — TECHNETIUM TC 99M DIETHYLENETRIAME-PENTAACETIC ACID: 32.0000 | Freq: Once | INTRAVENOUS | Status: DC | PRN

## 2016-04-20 ENCOUNTER — Encounter: Payer: Self-pay | Admitting: Physical Therapy

## 2016-04-20 ENCOUNTER — Ambulatory Visit: Payer: Medicare Other | Admitting: Physical Therapy

## 2016-04-20 DIAGNOSIS — L0291 Cutaneous abscess, unspecified: Secondary | ICD-10-CM

## 2016-04-20 DIAGNOSIS — M25561 Pain in right knee: Secondary | ICD-10-CM

## 2016-04-20 DIAGNOSIS — K651 Peritoneal abscess: Secondary | ICD-10-CM

## 2016-04-20 DIAGNOSIS — R262 Difficulty in walking, not elsewhere classified: Secondary | ICD-10-CM

## 2016-04-20 DIAGNOSIS — M6281 Muscle weakness (generalized): Secondary | ICD-10-CM

## 2016-04-20 NOTE — Therapy (Signed)
[facility] [address], [address] [place], [place], [address] Phone: [phone]   Fax:  [phone]  Physical Therapy Treatment  Patient Details  Name: Jonathan Le MRN: [id number] Date of Birth: 1946-05-17 Referring Provider: Elizabeth Palau, NP  Encounter Date: 04/20/2016      PT End of Session - 04/20/16 1447    Visit Number 8   Number of Visits 10   Date for PT Re-Evaluation 05/24/16   Authorization Type Use KX modifier on all visits   PT Start Time 1446   PT Stop Time 1540   PT Time Calculation (min) 54 min   Activity Tolerance Patient tolerated treatment well   Behavior During Therapy [facility] for tasks assessed/performed      Past Medical History  Diagnosis Date  . Anxiety   . Hypertension   . Arthritis   . Sleep apnea     wears CPAP nightly  . Glaucoma   . CHF (congestive heart failure) (HCC)     MI, with open heart surgery x 30 sdays ago per pt. (06/26/15)  . S/P CABG (coronary artery bypass graft)   . MI (myocardial infarction) (HCC)   . DVT (deep venous thrombosis) (HCC)   . Adult situational stress disorder   . Decubital ulcer   . GERD (gastroesophageal reflux disease)   . Anemia   . Chronic low back pain   . ED (erectile dysfunction)   . ETD (eustachian tube dysfunction)   . Ganglion cyst   . Ileostomy in place [facility])   . Osteoarthritis     Past Surgical History  Procedure Laterality Date  . Hernia repair      right  . Eye surgery      bil cataract  . Wisdom tooth extraction    . Fasciotomy foot / toe Left     foot  . Cardiac catheterization N/A 05/20/2015    Procedure: Left Heart Cath and Coronary Angiography;  Surgeon: Kathleene Hazel, MD;  Location: [facility] INVASIVE CV LAB;  Service: Cardiovascular;  Laterality: N/A;  . Coronary artery bypass graft N/A 05/22/2015    Procedure: CORONARY ARTERY BYPASS GRAFTING (CABG), ON PUMP, TIMES THREE, USING LEFT INTERNAL MAMMARY ARTERY, RIGHT GREATER  SAPHENOUS VEIN HARVESTED ENDOSCOPICALLY;  Surgeon: Kerin Perna, MD;  Location[facility] OR;  Service: Open Heart Surgery;  Laterality: N/A;  LIMA-LAD, SVG-OM, SVG-PD  . Tee without cardioversion N/A 05/22/2015    Procedure: TRANSESOPHAGEAL ECHOCARDIOGRAM (TEE);  Surgeon: Kerin Perna, MD;  Location: [facility] OR;  Service: Open Heart Surgery;  Laterality: N/A;  . Laparotomy N/A 06/18/2015    Procedure: EXPLORATORY LAPAROTOMY;  Surgeon: Emelia Loron, MD;  Location: [facility] OR;  Service: General;  Laterality: N/A;  . Laparotomy N/A 06/20/2015    Procedure: WASHOUT OF ABDOMEN, CLOSURE OF ABDOMINAL WALL WITH PLACEMENT OF WOUND VAC AND ILLEOSTOMY ;  Surgeon: Almond Lint, MD;  Location: [facility] OR;  Service: General;  Laterality: N/A;  . Tracheostomy tube placement N/A 07/03/2015    Procedure: TRACHEOSTOMY;  Surgeon: Serena Colonel, MD;  Location: [facility] OR;  Service: ENT;  Laterality: N/A;    There were no vitals filed for this visit.      Subjective Assessment - 04/20/16 1449    Subjective Pt had MD appt to check lungs for PE. He reports his lungs are clear. Knee is feeling SLIGHTLY better, RT shoulder continues to bother him.    Currently in Pain? Yes   Pain Score 4  Pain Location Knee   Pain Orientation Right   Pain Descriptors / Indicators Dull;Aching   Aggravating Factors  Weight bearing   Pain Relieving Factors Rest, meds, ice   Multiple Pain Sites No                         [facility] Adult PT Treatment/Exercise - 04/20/16 0001    Ambulation/Gait   Ambulation Distance (Feet) 26 Feet   Assistive device Straight cane  +2 on each side of Pt and WC behind   Ambulation Surface Level;Indoor   Knee/Hip Exercises: Aerobic   Nustep L2 x 10 min   Knee/Hip Exercises: Seated   Long Arc Quad Strengthening;Both;3 sets;10 reps   Long Arc Quad Weight 4 lbs.   Ball Squeeze 30x   Marching Strengthening;Both;3 sets;Weights;15 reps   [employer] Weights 4 lbs.   Abd/Adduction Limitations blue band hip abd  3 x 15   Cryotherapy   Number Minutes Cryotherapy 10 Minutes   Cryotherapy Location Knee   Type of Cryotherapy Ice pack                  PT Short Term Goals - 04/18/16 1456    PT SHORT TERM GOAL #4   Title walk for 10 minutes without significant fatigue   Time 4   Period Weeks   Status On-going           PT Long Term Goals - 04/20/16 1454    PT LONG TERM GOAL #1   Title be independent in advanced HEP   Time 8   Period Weeks   Status On-going   PT LONG TERM GOAL #3   Title perform TUG in < or = to 18 seconds to reduce falls risk   Time 8   Period Weeks   Status Achieved   16 sec               Plan - 04/20/16 1449    Clinical Impression Statement Pt able to walk 26 feet with cane today with 2 people on ach side for safety and WC behind. pt also able to tolerate an increase in his weight s to 4# with his PRE's today.    Rehab Potential Good   PT Frequency 2x / week   PT Duration 8 weeks   PT Treatment/Interventions ADLs/Self Care Home Management;Cryotherapy;Moist Heat;Therapeutic exercise;Therapeutic activities;Functional mobility training;Stair training;Gait training;Neuromuscular re-education;Patient/family education;Manual techniques;Passive range of motion   PT Next Visit Plan Ambulate with cane, chair exs, try standing heel raises   Consulted and Agree with Plan of Care Patient      Patient will benefit from skilled therapeutic intervention in order to improve the following deficits and impairments:  Abnormal gait, Difficulty walking, Postural dysfunction, Decreased strength, Decreased mobility, Decreased balance, Impaired flexibility, Improper body mechanics, Decreased activity tolerance, Cardiopulmonary status limiting activity, Decreased endurance  Visit Diagnosis: Muscle weakness (generalized)  Difficulty in walking, not elsewhere classified  Abscess of abdominal cavity (HCC)  Right knee pain  Abscess     Problem List Patient  Active Problem List   Diagnosis Date Noted  . History of DVT (deep vein thrombosis)   . Long-term (current) use of anticoagulants   . Old anterior myocardial infarction   . Sleep apnea   . Anemia of chronic disease 10/16/2015  . Chronic combined systolic and diastolic congestive heart failure (HCC) 09/25/2015  . Chronic respiratory failure (HCC) 09/17/2015  . 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  Ischemic cardiomyopathy   . CAD (coronary artery disease), native coronary artery   . Morbid obesity (HCC) 05/20/2015  . Benign prostatic hyperplasia with urinary obstruction 04/24/2015  . Chronic LBP 12/05/2013  . Essential hypertension 09/04/2013    Jonathan Le, PTA 04/20/2016, 3:29 PM  [facility] [address] [place], [place], [address] Phone: [phone]   Fax:  [phone]  Name: Jonathan Le MRN: [id number] Date of Birth: [birth year]

## 2016-04-25 ENCOUNTER — Ambulatory Visit: Payer: Medicare Other | Admitting: Physical Therapy

## 2016-04-25 ENCOUNTER — Encounter: Payer: Self-pay | Admitting: Physical Therapy

## 2016-04-25 DIAGNOSIS — K651 Peritoneal abscess: Secondary | ICD-10-CM

## 2016-04-25 DIAGNOSIS — R262 Difficulty in walking, not elsewhere classified: Secondary | ICD-10-CM

## 2016-04-25 DIAGNOSIS — L0291 Cutaneous abscess, unspecified: Secondary | ICD-10-CM

## 2016-04-25 DIAGNOSIS — M25561 Pain in right knee: Secondary | ICD-10-CM

## 2016-04-25 DIAGNOSIS — M6281 Muscle weakness (generalized): Secondary | ICD-10-CM | POA: Diagnosis not present

## 2016-04-25 NOTE — Therapy (Signed)
[facility] [address], [address] [place], [place], [address] Phone: [phone]   Fax:  [phone]  Physical Therapy Treatment  Patient Details  Name: Jonathan HarpsHarold D Mayo MRN: [id number] Date of Birth: 09/19/1946 Referring Provider: Elizabeth PalauAnderson, Teresa, NP  Encounter Date: 04/25/2016      PT End of Session - 04/25/16 1450    Visit Number 9   Number of Visits 10   Date for PT Re-Evaluation 05/24/16   Authorization Type Use KX modifier on all visits   PT Start Time 1450   PT Stop Time 1601   PT Time Calculation (min) 71 min   Equipment Utilized During Treatment Gait belt   Activity Tolerance Patient tolerated treatment well   Behavior During Therapy [facility] for tasks assessed/performed      Past Medical History  Diagnosis Date  . Anxiety   . Hypertension   . Arthritis   . Sleep apnea     wears CPAP nightly  . Glaucoma   . CHF (congestive heart failure) (HCC)     MI, with open heart surgery x 30 sdays ago per pt. (06/26/15)  . S/P CABG (coronary artery bypass graft)   . MI (myocardial infarction) (HCC)   . DVT (deep venous thrombosis) (HCC)   . Adult situational stress disorder   . Decubital ulcer   . GERD (gastroesophageal reflux disease)   . Anemia   . Chronic low back pain   . ED (erectile dysfunction)   . ETD (eustachian tube dysfunction)   . Ganglion cyst   . Ileostomy in place [facility](HCC)   . Osteoarthritis     Past Surgical History  Procedure Laterality Date  . Hernia repair      right  . Eye surgery      bil cataract  . Wisdom tooth extraction    . Fasciotomy foot / toe Left     foot  . Cardiac catheterization N/A 05/20/2015    Procedure: Left Heart Cath and Coronary Angiography;  Surgeon: Kathleene Hazelhristopher D McAlhany, MD;  [facility];  Service: Cardiovascular;  Laterality: N/A;  . Coronary artery bypass graft N/A 05/22/2015    Procedure: CORONARY ARTERY BYPASS GRAFTING (CABG), ON PUMP, TIMES THREE,  USING LEFT INTERNAL MAMMARY ARTERY, RIGHT GREATER SAPHENOUS VEIN HARVESTED ENDOSCOPICALLY;  Surgeon: Kerin PernaPeter Van Trigt, MD;  [facility];  Service: Open Heart Surgery;  Laterality: N/A;  LIMA-LAD, SVG-OM, SVG-PD  . Tee without cardioversion N/A 05/22/2015    Procedure: TRANSESOPHAGEAL ECHOCARDIOGRAM (TEE);  Surgeon: Kerin PernaPeter Van Trigt, MD;  [facility];  Service: Open Heart Surgery;  Laterality: N/A;  . Laparotomy N/A 06/18/2015    Procedure: EXPLORATORY LAPAROTOMY;  Surgeon: Emelia LoronMatthew Wakefield, MD;  [facility];  Service: General;  Laterality: N/A;  . Laparotomy N/A 06/20/2015    Procedure: WASHOUT OF ABDOMEN, CLOSURE OF ABDOMINAL WALL WITH PLACEMENT OF WOUND VAC AND ILLEOSTOMY ;  Surgeon: Almond LintFaera Byerly, MD;  Location: [facility] OR;  Service: General;  Laterality: N/A;  . Tracheostomy tube placement N/A 07/03/2015    Procedure: TRACHEOSTOMY;  Surgeon: Serena ColonelJefry Rosen, MD;  [facility];  Service: ENT;  Laterality: N/A;    There were no vitals filed for this visit.      Subjective Assessment - 04/25/16 1451    Subjective Pt purchased a cane and brought with him today. Pt reports feeling very sore all over after walking with the cane last session. He reports being very excited about walking.  Currently in Pain? Yes   Pain Score 7    Pain Location Knee   Pain Orientation Right   Pain Descriptors / Indicators Throbbing;Sore   Aggravating Factors  Standing, walking   Pain Relieving Factors Rest, meds, ice   Multiple Pain Sites No                         [facility] Adult PT Treatment/Exercise - 04/25/16 0001    Ambulation/Gait   Ambulation Distance (Feet) 41 Feet   Assistive device Straight cane  +2 for safety and WC behind   Ambulation Surface Level;Indoor   Knee/Hip Exercises: Aerobic   Nustep L2 x 10 min   Knee/Hip Exercises: Seated   Long Arc Quad Strengthening;Both;3 sets;10 reps   Long Arc Quad Weight 4 lbs.   Ball Squeeze 40x   Marching Strengthening;Both;3  sets;Weights;15 reps   [employer] 4 lbs.   Abd/Adduction Limitations blue band hip abd 3 x 15   Cryotherapy   Number Minutes Cryotherapy 10 Minutes   Cryotherapy Location Knee   Type of Cryotherapy Ice pack                  PT Short Term Goals - 04/25/16 1531    PT SHORT TERM GOAL #4   Title walk for 10 minutes without significant fatigue   Time 4   Period Weeks   Status On-going           PT Long Term Goals - 04/20/16 1454    PT LONG TERM GOAL #1   Title be independent in advanced HEP   Time 8   Period Weeks   Status On-going   PT LONG TERM GOAL #3   Title perform TUG in < or = to 18 seconds to reduce falls risk   Time 8   Period Weeks   Status Achieved   16 sec               Plan - 04/25/16 1450    Clinical Impression Statement Pt increased his walking distance to 41 feet today using the cane with +2 for safety. Continues to do well with his currents weights. Still cannot walk for long distances with his walker, knee pain limits.    Rehab Potential Good   PT Frequency 2x / week   PT Duration 8 weeks   PT Treatment/Interventions ADLs/Self Care Home Management;Cryotherapy;Moist Heat;Therapeutic exercise;Therapeutic activities;Functional mobility training;Stair training;Gait training;Neuromuscular re-education;Patient/family education;Manual techniques;Passive range of motion   PT Next Visit Plan Ambulate with cane, chair exs, try standing heel raises   Consulted and Agree with Plan of Care Patient      Patient will benefit from skilled therapeutic intervention in order to improve the following deficits and impairments:  Abnormal gait, Difficulty walking, Postural dysfunction, Decreased strength, Decreased mobility, Decreased balance, Impaired flexibility, Improper body mechanics, Decreased activity tolerance, Cardiopulmonary status limiting activity, Decreased endurance  Visit Diagnosis: Muscle weakness (generalized)  Difficulty in walking,  not elsewhere classified  Abscess of abdominal cavity (HCC)  Abscess  Right knee pain     Problem List Patient Active Problem List   Diagnosis Date Noted  . History of DVT (deep vein thrombosis)   . Long-term (current) use of anticoagulants   . Old anterior myocardial infarction   . Sleep apnea   . Anemia of chronic disease 10/16/2015  . Chronic combined systolic and diastolic congestive heart failure (HCC) 09/25/2015  . Chronic respiratory failure (HCC) 09/17/2015  .  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 Ischemic cardiomyopathy   . CAD (coronary artery disease), native coronary artery   . Morbid obesity (HCC) 05/20/2015  . Benign prostatic hyperplasia with urinary obstruction 04/24/2015  . Chronic LBP 12/05/2013  . Essential hypertension 09/04/2013    Charmon Thorson, PTA 04/25/2016, 4:23 PM  [facility] [address] [place], [place], [address] Phone: [phone]   Fax:  [phone]  Name: UCHECHUKWU DHAWAN MRN: [id number] Date of Birth: 03-Jan-1946

## 2016-04-27 ENCOUNTER — Ambulatory Visit: Payer: Medicare Other | Admitting: Physical Therapy

## 2016-04-27 ENCOUNTER — Encounter: Payer: Self-pay | Admitting: Physical Therapy

## 2016-04-27 ENCOUNTER — Ambulatory Visit: Payer: Medicare Other

## 2016-04-27 DIAGNOSIS — R262 Difficulty in walking, not elsewhere classified: Secondary | ICD-10-CM

## 2016-04-27 DIAGNOSIS — M6281 Muscle weakness (generalized): Secondary | ICD-10-CM

## 2016-04-27 NOTE — Therapy (Signed)
[facility] [address] [place], [place], [address] Phone: [phone]   Fax:  [phone]  Physical Therapy Treatment  Patient Details  Name: Jonathan Le MRN: [id number] Date of Birth: [birth year] Referring Provider: Elizabeth Palau, NP  Encounter Date: 04/27/2016      PT End of Session - 04/27/16 1610    Visit Number 10   Number of Visits 20   Date for PT Re-Evaluation 05/24/16   Authorization Type Use KX modifier on all visits   PT Start Time 1530   PT Stop Time 1620   PT Time Calculation (min) 50 min   Activity Tolerance Patient tolerated treatment well   Behavior During Therapy [facility] for tasks assessed/performed      Past Medical History  Diagnosis Date  . Anxiety   . Hypertension   . Arthritis   . Sleep apnea     wears CPAP nightly  . Glaucoma   . CHF (congestive heart failure) (HCC)     MI, with open heart surgery x 30 sdays ago per pt. (06/26/15)  . S/P CABG (coronary artery bypass graft)   . MI (myocardial infarction) (HCC)   . DVT (deep venous thrombosis) (HCC)   . Adult situational stress disorder   . Decubital ulcer   . GERD (gastroesophageal reflux disease)   . Anemia   . Chronic low back pain   . ED (erectile dysfunction)   . ETD (eustachian tube dysfunction)   . Ganglion cyst   . Ileostomy in place [facility])   . Osteoarthritis     Past Surgical History  Procedure Laterality Date  . Hernia repair      right  . Eye surgery      bil cataract  . Wisdom tooth extraction    . Fasciotomy foot / toe Left     foot  . Cardiac catheterization N/A 05/20/2015    Procedure: Left Heart Cath and Coronary Angiography;  Surgeon: Kathleene Hazel, MD;  [facility]: [facility] INVASIVE CV LAB;  Service: Cardiovascular;  Laterality: N/A;  . Coronary artery bypass graft N/A 05/22/2015    Procedure: CORONARY ARTERY BYPASS GRAFTING (CABG), ON PUMP, TIMES THREE, USING LEFT INTERNAL MAMMARY ARTERY, RIGHT GREATER  SAPHENOUS VEIN HARVESTED ENDOSCOPICALLY;  Surgeon: Kerin Perna, MD;  Location[facility] OR;  Service: Open Heart Surgery;  Laterality: N/A;  LIMA-LAD, SVG-OM, SVG-PD  . Tee without cardioversion N/A 05/22/2015    Procedure: TRANSESOPHAGEAL ECHOCARDIOGRAM (TEE);  Surgeon: Kerin Perna, MD;  [facility];  Service: Open Heart Surgery;  Laterality: N/A;  . Laparotomy N/A 06/18/2015    Procedure: EXPLORATORY LAPAROTOMY;  Surgeon: Emelia Loron, MD;  Location: [facility] OR;  Service: General;  Laterality: N/A;  . Laparotomy N/A 06/20/2015    Procedure: WASHOUT OF ABDOMEN, CLOSURE OF ABDOMINAL WALL WITH PLACEMENT OF WOUND VAC AND ILLEOSTOMY ;  Surgeon: Almond Lint, MD;  Location: [facility] OR;  Service: General;  Laterality: N/A;  . Tracheostomy tube placement N/A 07/03/2015    Procedure: TRACHEOSTOMY;  Surgeon: Serena Colonel, MD;  Location: [facility] OR;  Service: ENT;  Laterality: N/A;    There were no vitals filed for this visit.      Subjective Assessment - 04/27/16 1537    Subjective I have been improving.  I I feel my overall strength has built up. I can do more now. I do not mind pushing myself.    Pertinent History Heart attack: 05/17/15 with CAGB , In and out of hospital  and rehab over the past 215 days due to medial complications.  Recent onset of Rt shoulder pain   Limitations Walking;Standing   How long can you stand comfortably? 5 minutes with UE support   How long can you walk comfortably? 10 minutes with walker   Patient Stated Goals improve strength and endurance, stand longer    Currently in Pain? Yes   Pain Score 7    Pain Location Knee   Pain Orientation Right   Pain Descriptors / Indicators Throbbing;Sore   Pain Type Acute pain   Pain Radiating Towards None   Pain Onset More than a month ago   Pain Frequency Intermittent   Aggravating Factors  standing, walking   Pain Relieving Factors rest, meds, ice   Multiple Pain Sites No            [facility] PT Assessment - 04/27/16 0001     Assessment   Medical Diagnosis decreased strength (R53.1)   Referring Provider Elizabeth Palau, NP   Onset Date/Surgical Date 05/17/15   Prior Therapy in hospital and rehab stay.  Home Health PT that ended 2 weeks ago   Precautions   Precautions Fall   Restrictions   Weight Bearing Restrictions No   Balance Screen   Has the patient fallen in the past 6 months Yes   How many times? --  after he first came home from the [place];   Has the patient had a decrease in activity level because of a fear of falling?  No   Is the patient reluctant to leave their home because of a fear of falling?  No   Home Environment   Living Environment Private residence   Prior Function   Level of Independence Independent with basic ADLs;Independent with household mobility with device;Requires assistive device for independence   Cognition   Overall Cognitive Status Within Functional Limits for tasks assessed   AROM   Overall AROM  Deficits   Overall AROM Comments hamstring flexiblity limited by 25% in sitting with long arc quad   Strength   Overall Strength Deficits   Overall Strength Comments bil. knee flexion 5/5, bil. knee extension 4+/5, bil. hip flexion 4/5, bil. hip abduction 4/5, hip extension 4/5; bil. ankle dorsifleixon 3+/5   Ambulation/Gait   Ambulation Distance (Feet) 41 Feet   Assistive device Straight cane  +2 for safety and WC behind   Ambulation Surface Level;Indoor   Timed Up and Go Test   TUG Normal TUG   Normal TUG (seconds) 14  used rolling walker                     [facility] Adult PT Treatment/Exercise - 04/27/16 0001    Knee/Hip Exercises: Aerobic   Nustep L2 x 10 min   Knee/Hip Exercises: Seated   Long Arc Quad Strengthening;Both;10 reps;2 sets   [employer] 4 lbs.   Ball Squeeze 40x   Marching Strengthening;Both;3 sets;Weights;15 reps   [employer] 4 lbs.   Abd/Adduction Limitations blue band hip abd 3 x 15   Modalities   Modalities  Cryotherapy   Cryotherapy   Number Minutes Cryotherapy 10 Minutes   Cryotherapy Location Knee   Type of Cryotherapy Ice pack                PT Education - 04/27/16 1610    Education provided No          PT Short Term Goals - 04/25/16 1531  PT SHORT TERM GOAL #4   Title walk for 10 minutes without significant fatigue   Time 4   Period Weeks   Status On-going           PT Long Term Goals - 04/20/16 1454    PT LONG TERM GOAL #1   Title be independent in advanced HEP   Time 8   Period Weeks   Status On-going   PT LONG TERM GOAL #3   Title perform TUG in < or = to 18 seconds to reduce falls risk   Time 8   Period Weeks   Status Achieved   16 sec               Plan - 04/27/16 1611    Clinical Impression Statement Patient TUG score with rolling walker has increased to 14 seconds.  Patient is able to walk 41 feet with cane with increased speed. Patient is not using his cane at home yer.  Patient still needs to use UE to go from sit to stand. Patient had increased knee pain but still able to do all of his workout.    Rehab Potential Good   PT Frequency 2x / week   PT Duration 8 weeks   PT Treatment/Interventions ADLs/Self Care Home Management;Cryotherapy;Moist Heat;Therapeutic exercise;Therapeutic activities;Functional mobility training;Stair training;Gait training;Neuromuscular re-education;Patient/family education;Manual techniques;Passive range of motion   PT Next Visit Plan Ambulate with cane, chair exs, try standing heel raises   PT Home Exercise Plan progress as needed   Recommended Other Services None   Consulted and Agree with Plan of Care Patient      Patient will benefit from skilled therapeutic intervention in order to improve the following deficits and impairments:  Abnormal gait, Difficulty walking, Postural dysfunction, Decreased strength, Decreased mobility, Decreased balance, Impaired flexibility, Improper body mechanics, Decreased  activity tolerance, Cardiopulmonary status limiting activity, Decreased endurance  Visit Diagnosis: Muscle weakness (generalized)  Difficulty in walking, not elsewhere classified       G-Codes - 04/27/16 1615    Functional Assessment Tool Used TUG 14 seconds   Functional Limitation Mobility: Walking and moving around   Mobility: Walking and Moving Around Current Status [phone]([id number]) At least 40 percent but less than 60 percent impaired, limited or restricted   Mobility: Walking and Moving Around Goal Status [phone]([id number]) At least 40 percent but less than 60 percent impaired, limited or restricted      Problem List Patient Active Problem List   Diagnosis Date Noted  . History of DVT (deep vein thrombosis)   . Long-term (current) use of anticoagulants   . Old anterior myocardial infarction   . Sleep apnea   . Anemia of chronic disease 10/16/2015  . Chronic combined systolic and diastolic congestive heart failure (HCC) 09/25/2015  . Chronic respiratory failure (HCC) 09/17/2015  . 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 Ischemic cardiomyopathy   . CAD (coronary artery disease), native coronary artery   . Morbid obesity (HCC) 05/20/2015  . Benign prostatic hyperplasia with urinary obstruction 04/24/2015  . Chronic LBP 12/05/2013  . Essential hypertension 09/04/2013    Eulis Fosterheryl Maurio Baize, PT 04/27/2016 4:17 PM   [facility] [address] [place], [place], [address] Phone: [phone]   Fax:  [phone]  Name: [name] MRN: [id number] Date of Birth: 11/17/1945

## 2016-04-28 ENCOUNTER — Ambulatory Visit: Payer: Medicare Other

## 2016-04-28 ENCOUNTER — Encounter: Payer: Medicare Other | Admitting: Internal Medicine

## 2016-04-28 NOTE — Progress Notes (Signed)
ELECTROPHYSIOLOGY CONSULT NOTE  Patient ID: Jonathan HarpsHarold D Venezia, MRN: [id number], DOB/AGE: 71/12/1945 71 y.o. Admit date: (Not on file) Date of Consult: 04/28/2016  Primary Physician: Elizabeth PalauANDERSON,TERESA, FNP Primary Cardiologist:  Consulting PhysicianWST  Chief Complaint:    HPI Jonathan Le is a 70 y.o. male    He has a history of coronary artery disease having presented with a non-STEMI 7/16. Echocardiogram at that time demonstrated EF 30-35%. Catheterization demonstrated severe three-vessel disease with occluded LAD OM disease and PDA disease. He underwent bypass surgery 8/16  Echocardiogram 4/17 demonstrated an ejection fraction of 20% with anteroapical akinesis  He has a history of chronic DVT currently treated with warfarin   Past Medical History  Diagnosis Date  . Anxiety   . Hypertension   . Arthritis   . Sleep apnea     wears CPAP nightly  . Glaucoma   . CHF (congestive heart failure) (HCC)     MI, with open heart surgery x 30 sdays ago per pt. (06/26/15)  . S/P CABG (coronary artery bypass graft)   . MI (myocardial infarction) (HCC)   . DVT (deep venous thrombosis) (HCC)   . Adult situational stress disorder   . Decubital ulcer   . GERD (gastroesophageal reflux disease)   . Anemia   . Chronic low back pain   . ED (erectile dysfunction)   . ETD (eustachian tube dysfunction)   . Ganglion cyst   . Ileostomy in place [facility](HCC)   . Osteoarthritis       Surgical History:  Past Surgical History  Procedure Laterality Date  . Hernia repair      right  . Eye surgery      bil cataract  . Wisdom tooth extraction    . Fasciotomy foot / toe Left     foot  . Cardiac catheterization N/A 05/20/2015    Procedure: Left Heart Cath and Coronary Angiography;  Surgeon: Kathleene Hazelhristopher D McAlhany, MD;  [facility];  Service: Cardiovascular;  Laterality: N/A;  . Coronary artery bypass graft N/A 05/22/2015    Procedure: CORONARY ARTERY BYPASS GRAFTING (CABG), ON  PUMP, TIMES THREE, USING LEFT INTERNAL MAMMARY ARTERY, RIGHT GREATER SAPHENOUS VEIN HARVESTED ENDOSCOPICALLY;  Surgeon: Kerin PernaPeter Van Trigt, MD;  [facility];  Service: Open Heart Surgery;  Laterality: N/A;  LIMA-LAD, SVG-OM, SVG-PD  . Tee without cardioversion N/A 05/22/2015    Procedure: TRANSESOPHAGEAL ECHOCARDIOGRAM (TEE);  Surgeon: Kerin PernaPeter Van Trigt, MD;  [facility];  Service: Open Heart Surgery;  Laterality: N/A;  . Laparotomy N/A 06/18/2015    Procedure: EXPLORATORY LAPAROTOMY;  Surgeon: Emelia LoronMatthew Wakefield, MD;  [facility];  Service: General;  Laterality: N/A;  . Laparotomy N/A 06/20/2015    Procedure: WASHOUT OF ABDOMEN, CLOSURE OF ABDOMINAL WALL WITH PLACEMENT OF WOUND VAC AND ILLEOSTOMY ;  Surgeon: Almond LintFaera Byerly, MD;  Location: [facility] OR;  Service: General;  Laterality: N/A;  . Tracheostomy tube placement N/A 07/03/2015    Procedure: TRACHEOSTOMY;  Surgeon: Serena ColonelJefry Rosen, MD;  Location: [facility] OR;  Service: ENT;  Laterality: N/A;     Home Meds: Prior to Admission medications   Medication Sig Start Date End Date Taking? Authorizing Provider  acetaminophen (TYLENOL) 325 MG tablet Take 2 tablets (650 mg total) by mouth 2 (two) times daily with breakfast and lunch. 09/24/15   Evlyn KannerPamela S Love, PA-C  albuterol (PROVENTIL) (2.5 MG/3ML) 0.083% nebulizer solution Take 3 mLs (2.5 mg total) by nebulization every 4 (four) hours as needed  for wheezing. 11/23/15   Esperanza Sheets, MD  Amino Acids-Protein Hydrolys (FEEDING SUPPLEMENT, PRO-STAT SUGAR FREE 64,) LIQD Take 30 mLs by mouth 3 (three) times daily. 09/24/15   Jacquelynn Cree, PA-C  diclofenac sodium (VOLTAREN) 1 % GEL Apply 2 g topically 4 (four) times daily. 09/24/15   Jacquelynn Cree, PA-C  famotidine (PEPCID) 20 MG tablet Take 1 tablet (20 mg total) by mouth 2 (two) times daily. 09/24/15   Jacquelynn Cree, PA-C  finasteride (PROSCAR) 5 MG tablet Take 1 tablet (5 mg total) by mouth daily. 09/24/15   Jacquelynn Cree, PA-C  furosemide (LASIX) 40 MG tablet Take 1  tablet (40 mg total) by mouth daily. 09/24/15   Jacquelynn Cree, PA-C  HYDROcodone-acetaminophen (NORCO/VICODIN) 5-325 MG tablet Take 1 tablet by mouth every 6 (six) hours as needed for moderate pain. 11/23/15   Esperanza Sheets, MD  magnesium oxide (MAG-OX) 400 (241.3 MG) MG tablet Take 2 tablets (800 mg total) by mouth 2 (two) times daily. 09/24/15   Jacquelynn Cree, PA-C  Multiple Vitamins-Minerals (DECUBI-VITE PO) Take 1 tablet by mouth daily.    Historical Provider, MD  nutrition supplement, JUVEN, (JUVEN) PACK Take 1 packet by mouth daily.    Historical Provider, MD  pantoprazole (PROTONIX) 40 MG tablet Take 40 mg by mouth daily.    Historical Provider, MD  potassium chloride (K-DUR,KLOR-CON) 10 MEQ tablet Take 30 mEq by mouth 2 (two) times daily.    Historical Provider, MD  tamsulosin (FLOMAX) 0.4 MG CAPS capsule Take 1 capsule (0.4 mg total) by mouth daily after supper. 09/24/15   Jacquelynn Cree, PA-C  warfarin (COUMADIN) 5 MG tablet Take 1 tablet (5 mg total) by mouth daily at 6 PM. 09/24/15   Jacquelynn Cree, PA-C    Allergies:  Allergies  Allergen Reactions  . Unasyn [Ampicillin-Sulbactam Sodium] Hives, Itching and Rash    Diffuse rash not responding to Pepcid and Benadryl.  TDD.  Marland Kitchen Levaquin [Levofloxacin] Nausea Only  . Penicillins Hives    MAR  . Doxycycline Rash    Social History   Social History  . Marital Status: Married    Spouse Name: N/A  . Number of Children: N/A  . Years of Education: N/A   Occupational History  . Not on file.   Social History Main Topics  . Smoking status: Never Smoker   . Smokeless tobacco: Never Used  . Alcohol Use: No  . Drug Use: No  . Sexual Activity: Not Currently   Other Topics Concern  . Not on file   Social History Narrative     Family History  Problem Relation Age of Onset  . Alzheimer's disease Father   . Rheum arthritis Mother      ROS:  Please see the history of present illness.     All other systems reviewed and negative.     Physical Exam: There were no vitals taken for this visit. General: Well developed, well nourished male in no acute distress. Head: Normocephalic, atraumatic, sclera non-icteric, no xanthomas, nares are without discharge. EENT: normal  Lymph Nodes:  none Neck: Negative for carotid bruits. JVD not elevated. Back:without scoliosis kyphosis Lungs: Clear bilaterally to auscultation without wheezes, rales, or rhonchi. Breathing is unlabored. Heart: RRR with S1 S2. No  /6 systolic murmur . No rubs, or gallops appreciated. Abdomen: Soft, non-tender, non-distended with normoactive bowel sounds. No hepatomegaly. No rebound/guarding. No obvious abdominal masses. Msk:  Strength and tone appear normal  for age. Extremities: No clubbing or cyanosis. No + edema.  Distal pedal pulses are 2+ and equal bilaterally. Skin: Warm and Dry Neuro: Alert and oriented X 3. CN III-XII intact Grossly normal sensory and motor function . Psych:  Responds to questions appropriately with a normal affect.      Labs: Cardiac Enzymes No results for input(s): CKTOTAL, CKMB, TROPONINI in the last 72 hours. CBC Lab Results  Component Value Date   WBC 4.5 11/23/2015   HGB 10.5* 11/23/2015   HCT 33.9* 11/23/2015   MCV 91.6 11/23/2015   PLT 235 11/23/2015   PROTIME: No results for input(s): LABPROT, INR in the last 72 hours. Chemistry No results for input(s): NA, K, CL, CO2, BUN, CREATININE, CALCIUM, PROT, BILITOT, ALKPHOS, ALT, AST, GLUCOSE in the last 168 hours.  Invalid input(s): LABALBU Lipids Lab Results  Component Value Date   CHOL 121 05/20/2015   HDL 40* 05/20/2015   LDLCALC 69 05/20/2015   TRIG 137 06/30/2015   BNP No results found for: PROBNP Thyroid Function Tests: No results for input(s): TSH, T4TOTAL, T3FREE, THYROIDAB in the last 72 hours.  Invalid input(s): FREET3 Miscellaneous No results found for: DDIMER  Radiology/Studies:  Dg Chest 2 View  04/19/2016  CLINICAL DATA:  Shortness of  breath with lower extremity edema EXAM: CHEST  2 VIEW COMPARISON:  March 01, 2016 FINDINGS: There is no edema or consolidation. Interstitium is mildly prominent but stable. There is cardiac enlargement with pulmonary vascularity within normal limits. No adenopathy. Patient is status post coronary artery bypass grafting. No bone lesions. IMPRESSION: Cardiomegaly. No edema or consolidation. Mild interstitial prominence likely reflects chronic inflammatory type change and is stable. Electronically Signed   By: Bretta Bang III M.D.   On: 04/19/2016 15:51   Nm Pulmonary Perf And Vent  04/19/2016  CLINICAL DATA:  Lower extremity edema and shortness of breath. Deep venous thromboses EXAM: NUCLEAR MEDICINE VENTILATION - PERFUSION LUNG SCAN Views: Anterior, posterior, left lateral, right lateral, RPO, LPO, RAO, LAO -ventilation and perfusion RADIOPHARMACEUTICALS:  32.0 mCi Technetium-46m DTPA aerosol inhalation and 4.1 mCi Technetium-60m MAA IV COMPARISON:  Chest radiograph April 19, 2016 FINDINGS: Ventilation: Radiotracer uptake bilaterally appears homogeneous and symmetric bilaterally. No ventilation defects are evident. Perfusion: Radiotracer uptake bilaterally appears homogeneous and symmetric bilaterally. No perfusion defects are identified. Heart is noted to be enlarged. IMPRESSION: No appreciable ventilation or perfusion defect. Very low probability of pulmonary embolus. Cardiomegaly noted. Electronically Signed   By: Bretta Bang III M.D.   On: 04/19/2016 15:50    EKG:    Assessment and Plan:  Sherryl Manges

## 2016-05-02 ENCOUNTER — Ambulatory Visit: Payer: Medicare Other | Admitting: Physical Therapy

## 2016-05-02 ENCOUNTER — Encounter: Payer: Self-pay | Admitting: Physical Therapy

## 2016-05-02 DIAGNOSIS — M6281 Muscle weakness (generalized): Secondary | ICD-10-CM | POA: Diagnosis not present

## 2016-05-02 DIAGNOSIS — R262 Difficulty in walking, not elsewhere classified: Secondary | ICD-10-CM

## 2016-05-02 NOTE — Therapy (Signed)
[facility] [address] [place], [place], [address] Phone: [phone]   Fax:  [phone]  Physical Therapy Treatment  Patient Details  Name: Jonathan Le MRN: [id number] Date of Birth: 15-Sep-1946 Referring Provider: Elizabeth Palau, NP  Encounter Date: 05/02/2016      PT End of Session - 05/02/16 1529    Visit Number 11   Number of Visits 20   Date for PT Re-Evaluation 05/24/16   Authorization Type Use KX modifier on all visits   PT Start Time 1445   PT Stop Time 1530   PT Time Calculation (min) 45 min   Equipment Utilized During Treatment Gait belt   Activity Tolerance Patient tolerated treatment well   Behavior During Therapy [facility] for tasks assessed/performed      Past Medical History  Diagnosis Date  . Anxiety   . Hypertension   . Arthritis   . Sleep apnea     wears CPAP nightly  . Glaucoma   . CHF (congestive heart failure) (HCC)     MI, with open heart surgery x 30 sdays ago per pt. (06/26/15)  . S/P CABG (coronary artery bypass graft)   . MI (myocardial infarction) (HCC)   . DVT (deep venous thrombosis) (HCC)   . Adult situational stress disorder   . Decubital ulcer   . GERD (gastroesophageal reflux disease)   . Anemia   . Chronic low back pain   . ED (erectile dysfunction)   . ETD (eustachian tube dysfunction)   . Ganglion cyst   . Ileostomy in place [facility])   . Osteoarthritis     Past Surgical History  Procedure Laterality Date  . Hernia repair      right  . Eye surgery      bil cataract  . Wisdom tooth extraction    . Fasciotomy foot / toe Left     foot  . Cardiac catheterization N/A 05/20/2015    Procedure: Left Heart Cath and Coronary Angiography;  Surgeon: Kathleene Hazel, MD;  [facility] INVASIVE CV LAB;  Service: Cardiovascular;  Laterality: N/A;  . Coronary artery bypass graft N/A 05/22/2015    Procedure: CORONARY ARTERY BYPASS GRAFTING (CABG), ON PUMP, TIMES THREE,  USING LEFT INTERNAL MAMMARY ARTERY, RIGHT GREATER SAPHENOUS VEIN HARVESTED ENDOSCOPICALLY;  Surgeon: Kerin Perna, MD;  Location: [facility] OR;  Service: Open Heart Surgery;  Laterality: N/A;  LIMA-LAD, SVG-OM, SVG-PD  . Tee without cardioversion N/A 05/22/2015    Procedure: TRANSESOPHAGEAL ECHOCARDIOGRAM (TEE);  Surgeon: Kerin Perna, MD;  Location: [facility] OR;  Service: Open Heart Surgery;  Laterality: N/A;  . Laparotomy N/A 06/18/2015    Procedure: EXPLORATORY LAPAROTOMY;  Surgeon: Emelia Loron, MD;  Location: [facility] OR;  Service: General;  Laterality: N/A;  . Laparotomy N/A 06/20/2015    Procedure: WASHOUT OF ABDOMEN, CLOSURE OF ABDOMINAL WALL WITH PLACEMENT OF WOUND VAC AND ILLEOSTOMY ;  Surgeon: Almond Lint, MD;  Location: [facility] OR;  Service: General;  Laterality: N/A;  . Tracheostomy tube placement N/A 07/03/2015    Procedure: TRACHEOSTOMY;  Surgeon: Serena Colonel, MD;  Location: [facility] OR;  Service: ENT;  Laterality: N/A;    There were no vitals filed for this visit.      Subjective Assessment - 05/02/16 1500    Subjective My overall strength and endurance is improving.    Pertinent History Heart attack: 05/17/15 with CAGB , In and out of hospital and rehab over the past 215 days due to medial  complications.  Recent onset of Rt shoulder pain   Limitations Walking;Standing   How long can you stand comfortably? 5 minutes with UE support   How long can you walk comfortably? 10 minutes with walker   Patient Stated Goals improve strength and endurance, stand longer    Currently in Pain? Yes   Pain Score 5    Pain Location Knee   Pain Orientation Right   Pain Descriptors / Indicators Sore;Throbbing   Pain Type Acute pain   Pain Onset More than a month ago   Pain Frequency Intermittent   Aggravating Factors  standing, walking   Pain Relieving Factors rest, meds, ice   Multiple Pain Sites No                         [facility] Adult PT Treatment/Exercise - 05/02/16 0001    Ambulation/Gait    Ambulation Distance (Feet) 64 Feet   Assistive device Straight cane  Gait belt and min A for safety   Ambulation Surface Level;Indoor   Knee/Hip Exercises: Aerobic   Nustep L2 x 10 min  0.405mph   Knee/Hip Exercises: Seated   Long Arc Quad Strengthening;Both;3 sets;10 reps   Long Arc Quad Weight 4 lbs.   Ball Squeeze 50x   Marching Strengthening;Both;3 sets;Weights;15 reps   [place] SouthernMarching Weights 4 lbs.   Abd/Adduction Limitations blue band hip abd 3 x 15   Modalities   Modalities Cryotherapy   Cryotherapy   Number Minutes Cryotherapy 10 Minutes   Cryotherapy Location Knee   Type of Cryotherapy Ice pack   Ankle Exercises: Seated   Other Seated Ankle Exercises Rocker board ankle PF/DF 3 min                  PT Short Term Goals - 05/02/16 1539    PT SHORT TERM GOAL #1   Title be independent in initial HEP   Time 4   Period Weeks   Status Achieved   PT SHORT TERM GOAL #2   Title improve LE strength to perform sit to stand with moderate UE support   Time 4   Period Weeks   Status Achieved   PT SHORT TERM GOAL #3   Title perform TUG in < or = to 21 seconds to reduce risk of falls   Time 4   Period Weeks   Status Achieved   PT SHORT TERM GOAL #4   Title walk for 10 minutes without significant fatigue   Time 4   Period Weeks   Status On-going           PT Long Term Goals - 04/20/16 1454    PT LONG TERM GOAL #1   Title be independent in advanced HEP   Time 8   Period Weeks   Status On-going   PT LONG TERM GOAL #3   Title perform TUG in < or = to 18 seconds to reduce falls risk   Time 8   Period Weeks   Status Achieved   16 sec               Plan - 05/02/16 1536    Clinical Impression Statement Pt was able to increase the repetitions on all exercises and able to ambulate 64 feet with Hurry cane and Supervision, pt with short step length left. Pt will continue to benefit from skilled PT to improve overall endurance and strength.      Rehab  Potential Good  PT Frequency 2x / week   PT Duration 8 weeks   PT Treatment/Interventions ADLs/Self Care Home Management;Cryotherapy;Moist Heat;Therapeutic exercise;Therapeutic activities;Functional mobility training;Stair training;Gait training;Neuromuscular re-education;Patient/family education;Manual techniques;Passive range of motion   PT Next Visit Plan Ambulate with cane, chair exs, try standing heel raises   PT Home Exercise Plan progress as needed   Consulted and Agree with Plan of Care Patient      Patient will benefit from skilled therapeutic intervention in order to improve the following deficits and impairments:  Abnormal gait, Difficulty walking, Postural dysfunction, Decreased strength, Decreased mobility, Decreased balance, Impaired flexibility, Improper body mechanics, Decreased activity tolerance, Cardiopulmonary status limiting activity, Decreased endurance  Visit Diagnosis: Muscle weakness (generalized)  Difficulty in walking, not elsewhere classified     Problem List Patient Active Problem List   Diagnosis Date Noted  . History of DVT (deep vein thrombosis)   . Long-term (current) use of anticoagulants   . Old anterior myocardial infarction   . Sleep apnea   . Anemia of chronic disease 10/16/2015  . Chronic combined systolic and diastolic congestive heart failure (HCC) 09/25/2015  . Chronic respiratory failure (HCC) 09/17/2015  . 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 Ischemic cardiomyopathy   . CAD (coronary artery disease), native coronary artery   . Morbid obesity (HCC) 05/20/2015  . Benign prostatic hyperplasia with urinary obstruction 04/24/2015  . Chronic LBP 12/05/2013  . Essential hypertension 09/04/2013    NAUMANN-HOUEGNIFIO,Mallory Schaad PTA 05/02/2016, 3:44 PM  [facility] [address] [place], [place], [address] Phone: [phone]   Fax:  [phone]  Name: Jonathan Le MRN: [id number] Date  of Birth: May 08, 1946

## 2016-05-05 ENCOUNTER — Other Ambulatory Visit: Payer: Self-pay | Admitting: Cardiothoracic Surgery

## 2016-05-09 ENCOUNTER — Encounter: Payer: Self-pay | Admitting: Physical Therapy

## 2016-05-09 ENCOUNTER — Ambulatory Visit: Payer: Medicare Other | Admitting: Physical Therapy

## 2016-05-09 DIAGNOSIS — M6281 Muscle weakness (generalized): Secondary | ICD-10-CM | POA: Diagnosis not present

## 2016-05-09 DIAGNOSIS — R262 Difficulty in walking, not elsewhere classified: Secondary | ICD-10-CM

## 2016-05-09 DIAGNOSIS — M25561 Pain in right knee: Secondary | ICD-10-CM

## 2016-05-09 DIAGNOSIS — L0291 Cutaneous abscess, unspecified: Secondary | ICD-10-CM

## 2016-05-09 DIAGNOSIS — K651 Peritoneal abscess: Secondary | ICD-10-CM

## 2016-05-09 NOTE — Therapy (Signed)
[facility] [address] [place], [place], [address] Phone: [phone]   Fax:  [phone]  Physical Therapy Treatment  Patient Details  Name: Jonathan Le MRN: [id number] Date of Birth: [birth year] Referring Provider: Elizabeth Palau, NP  Encounter Date: 05/09/2016      PT End of Session - 05/09/16 1446    Visit Number 12   Number of Visits 20   Date for PT Re-Evaluation 05/24/16   Authorization Type Use KX modifier on all visits   PT Start Time 1440   PT Stop Time 1540   PT Time Calculation (min) 60 min   Equipment Utilized During Treatment Gait belt   Activity Tolerance Patient tolerated treatment well   Behavior During Therapy [facility] for tasks assessed/performed      Past Medical History  Diagnosis Date  . Anxiety   . Hypertension   . Arthritis   . Sleep apnea     wears CPAP nightly  . Glaucoma   . CHF (congestive heart failure) (HCC)     MI, with open heart surgery x 30 sdays ago per pt. (06/26/15)  . S/P CABG (coronary artery bypass graft)   . MI (myocardial infarction) (HCC)   . DVT (deep venous thrombosis) (HCC)   . Adult situational stress disorder   . Decubital ulcer   . GERD (gastroesophageal reflux disease)   . Anemia   . Chronic low back pain   . ED (erectile dysfunction)   . ETD (eustachian tube dysfunction)   . Ganglion cyst   . Ileostomy in place [facility])   . Osteoarthritis     Past Surgical History  Procedure Laterality Date  . Hernia repair      right  . Eye surgery      bil cataract  . Wisdom tooth extraction    . Fasciotomy foot / toe Left     foot  . Cardiac catheterization N/A 05/20/2015    Procedure: Left Heart Cath and Coronary Angiography;  Surgeon: Kathleene Hazel, MD;  Location: [facility] INVASIVE CV LAB;  Service: Cardiovascular;  Laterality: N/A;  . Coronary artery bypass graft N/A 05/22/2015    Procedure: CORONARY ARTERY BYPASS GRAFTING (CABG), ON PUMP, TIMES THREE,  USING LEFT INTERNAL MAMMARY ARTERY, RIGHT GREATER SAPHENOUS VEIN HARVESTED ENDOSCOPICALLY;  Surgeon: Kerin Perna, MD;  Location: [facility] OR;  Service: Open Heart Surgery;  Laterality: N/A;  LIMA-LAD, SVG-OM, SVG-PD  . Tee without cardioversion N/A 05/22/2015    Procedure: TRANSESOPHAGEAL ECHOCARDIOGRAM (TEE);  Surgeon: Kerin Perna, MD;  Location: [facility] OR;  Service: Open Heart Surgery;  Laterality: N/A;  . Laparotomy N/A 06/18/2015    Procedure: EXPLORATORY LAPAROTOMY;  Surgeon: Emelia Loron, MD;  Location: [facility] OR;  Service: General;  Laterality: N/A;  . Laparotomy N/A 06/20/2015    Procedure: WASHOUT OF ABDOMEN, CLOSURE OF ABDOMINAL WALL WITH PLACEMENT OF WOUND VAC AND ILLEOSTOMY ;  Surgeon: Almond Lint, MD;  [facility] OR;  Service: General;  Laterality: N/A;  . Tracheostomy tube placement N/A 07/03/2015    Procedure: TRACHEOSTOMY;  Surgeon: Serena Colonel, MD;  Location: [facility] OR;  Service: ENT;  Laterality: N/A;    There were no vitals filed for this visit.      Subjective Assessment - 05/09/16 1445    Subjective I feel very stiff today.  I missed coming to therapy 2x last week.    Currently in Pain? Yes   Pain Score 5    Pain Location Knee  Pain Orientation Right   Pain Descriptors / Indicators Sore   Aggravating Factors  Standing/walking   Pain Relieving Factors rest, ice   Multiple Pain Sites No                         [facility] Adult PT Treatment/Exercise - 05/09/16 0001    Ambulation/Gait   Ambulation Distance (Feet) --  44 feet x 2   Assistive device Straight cane  Gait belt and min A for safety   Ambulation Surface Level;Indoor   Knee/Hip Exercises: Aerobic   Nustep L3 x 10 min   Knee/Hip Exercises: Seated   Long Arc Quad Strengthening;Both;3 sets;10 reps   Long Arc Quad Weight 4 lbs.   Ball Squeeze 50x   Marching Strengthening;Both;3 sets;Weights;15 reps   [employer]Marching Weights 4 lbs.   Abd/Adduction Limitations blue band hip abd 50x   Cryotherapy   Number  Minutes Cryotherapy 10 Minutes   Cryotherapy Location Knee   Type of Cryotherapy Ice pack   Ankle Exercises: Seated   Other Seated Ankle Exercises Rocker board ankle PF/DF 3 min                  PT Short Term Goals - 05/02/16 1539    PT SHORT TERM GOAL #1   Title be independent in initial HEP   Time 4   Period Weeks   Status Achieved   PT SHORT TERM GOAL #2   Title improve LE strength to perform sit to stand with moderate UE support   Time 4   Period Weeks   Status Achieved   PT SHORT TERM GOAL #3   Title perform TUG in < or = to 21 seconds to reduce risk of falls   Time 4   Period Weeks   Status Achieved   PT SHORT TERM GOAL #4   Title walk for 10 minutes without significant fatigue   Time 4   Period Weeks   Status On-going           PT Long Term Goals - 05/09/16 1447    PT LONG TERM GOAL #1   Title be independent in advanced HEP   Time 8   Period Weeks   Status On-going  Working on   [employer] CorporationPT LONG TERM GOAL #5   Title wean from walker 50% of the time for home distances   Time 8   Period Weeks   Status On-going               Plan - 05/09/16 1447    Clinical Impression Statement Pt is now able to use his cane to go to the bathroom at home. He still needs to sit his rollator for kitchen tasks due to decreased standing tolerance. RT knee pain still limited. Pt able to walk 2 bouts of 44 feet today withthe cane. He  describes this is farther than any distance he would have to walk at home.  Pt was instructed to increase his knee swing more and hike the RT hip less.   Rehab Potential Good   PT Frequency 2x / week   PT Duration 8 weeks   PT Treatment/Interventions ADLs/Self Care Home Management;Cryotherapy;Moist Heat;Therapeutic exercise;Therapeutic activities;Functional mobility training;Stair training;Gait training;Neuromuscular re-education;Patient/family education;Manual techniques;Passive range of motion   PT Next Visit Plan Ambulate with cane, chair  exs,    Consulted and Agree with Plan of Care Patient      Patient will benefit from skilled therapeutic  intervention in order to improve the following deficits and impairments:  Abnormal gait, Difficulty walking, Postural dysfunction, Decreased strength, Decreased mobility, Decreased balance, Impaired flexibility, Improper body mechanics, Decreased activity tolerance, Cardiopulmonary status limiting activity, Decreased endurance  Visit Diagnosis: Muscle weakness (generalized)  Difficulty in walking, not elsewhere classified  Abscess of abdominal cavity (HCC)  Abscess  Right knee pain     Problem List Patient Active Problem List   Diagnosis Date Noted  . History of DVT (deep vein thrombosis)   . Long-term (current) use of anticoagulants   . Old anterior myocardial infarction   . Sleep apnea   . Anemia of chronic disease 10/16/2015  . Chronic combined systolic and diastolic congestive heart failure (HCC) 09/25/2015  . Chronic respiratory failure (HCC) 09/17/2015  . 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 Ischemic cardiomyopathy   . CAD (coronary artery disease), native coronary artery   . Morbid obesity (HCC) 05/20/2015  . Benign prostatic hyperplasia with urinary obstruction 04/24/2015  . Chronic LBP 12/05/2013  . Essential hypertension 09/04/2013    COCHRAN,JENNIFER, PTA 05/09/2016, 3:30 PM  [facility] [address], [address] [place], [place], [address] Phone: [phone]   Fax:  [phone]  Name: Jonathan Le MRN: [id number] Date of Birth: 03/14/1946

## 2016-05-11 ENCOUNTER — Ambulatory Visit: Payer: Medicare Other | Admitting: Physical Therapy

## 2016-05-11 ENCOUNTER — Encounter: Payer: Self-pay | Admitting: Physical Therapy

## 2016-05-11 DIAGNOSIS — L0291 Cutaneous abscess, unspecified: Secondary | ICD-10-CM

## 2016-05-11 DIAGNOSIS — K651 Peritoneal abscess: Secondary | ICD-10-CM

## 2016-05-11 DIAGNOSIS — R262 Difficulty in walking, not elsewhere classified: Secondary | ICD-10-CM

## 2016-05-11 DIAGNOSIS — M6281 Muscle weakness (generalized): Secondary | ICD-10-CM

## 2016-05-11 DIAGNOSIS — M25561 Pain in right knee: Secondary | ICD-10-CM

## 2016-05-11 NOTE — Therapy (Signed)
[facility] [address], [address] [place], [place], [address] Phone: [phone]   Fax:  [phone]  Physical Therapy Treatment  Patient Details  Name: Jonathan HarpsHarold D Rambert MRN: [id number] Date of Birth: 04/26/1946 Referring Provider: Elizabeth PalauAnderson, Teresa, NP  Encounter Date: 05/11/2016      PT End of Session - 05/11/16 1447    Visit Number 13   Number of Visits 20   Date for PT Re-Evaluation 05/24/16   Authorization Type Use KX modifier on all visits   PT Start Time 1445   PT Stop Time 1550   PT Time Calculation (min) 65 min   Equipment Utilized During Treatment Gait belt   Activity Tolerance Patient tolerated treatment well   Behavior During Therapy [facility] for tasks assessed/performed      Past Medical History  Diagnosis Date  . Anxiety   . Hypertension   . Arthritis   . Sleep apnea     wears CPAP nightly  . Glaucoma   . CHF (congestive heart failure) (HCC)     MI, with open heart surgery x 30 sdays ago per pt. (06/26/15)  . S/P CABG (coronary artery bypass graft)   . MI (myocardial infarction) (HCC)   . DVT (deep venous thrombosis) (HCC)   . Adult situational stress disorder   . Decubital ulcer   . GERD (gastroesophageal reflux disease)   . Anemia   . Chronic low back pain   . ED (erectile dysfunction)   . ETD (eustachian tube dysfunction)   . Ganglion cyst   . Ileostomy in place [facility](HCC)   . Osteoarthritis     Past Surgical History  Procedure Laterality Date  . Hernia repair      right  . Eye surgery      bil cataract  . Wisdom tooth extraction    . Fasciotomy foot / toe Left     foot  . Cardiac catheterization N/A 05/20/2015    Procedure: Left Heart Cath and Coronary Angiography;  Surgeon: Kathleene Hazelhristopher D McAlhany, MD;  [facility];  Service: Cardiovascular;  Laterality: N/A;  . Coronary artery bypass graft N/A 05/22/2015    Procedure: CORONARY ARTERY BYPASS GRAFTING (CABG), ON PUMP, TIMES THREE,  USING LEFT INTERNAL MAMMARY ARTERY, RIGHT GREATER SAPHENOUS VEIN HARVESTED ENDOSCOPICALLY;  Surgeon: Kerin PernaPeter Van Trigt, MD;  [facility];  Service: Open Heart Surgery;  Laterality: N/A;  LIMA-LAD, SVG-OM, SVG-PD  . Tee without cardioversion N/A 05/22/2015    Procedure: TRANSESOPHAGEAL ECHOCARDIOGRAM (TEE);  Surgeon: Kerin PernaPeter Van Trigt, MD;  [facility];  Service: Open Heart Surgery;  Laterality: N/A;  . Laparotomy N/A 06/18/2015    Procedure: EXPLORATORY LAPAROTOMY;  Surgeon: Emelia LoronMatthew Wakefield, MD;  [facility];  Service: General;  Laterality: N/A;  . Laparotomy N/A 06/20/2015    Procedure: WASHOUT OF ABDOMEN, CLOSURE OF ABDOMINAL WALL WITH PLACEMENT OF WOUND VAC AND ILLEOSTOMY ;  Surgeon: Almond LintFaera Byerly, MD;  Location: [facility] OR;  Service: General;  Laterality: N/A;  . Tracheostomy tube placement N/A 07/03/2015    Procedure: TRACHEOSTOMY;  Surgeon: Serena ColonelJefry Rosen, MD;  [facility];  Service: ENT;  Laterality: N/A;    There were no vitals filed for this visit.      Subjective Assessment - 05/11/16 1456    Subjective Knee stiff and sore today. The walking in his sessions do not increase his knee pain.    Currently in Pain? Yes   Pain Score 5    Pain Location  Knee   Pain Orientation Right   Pain Descriptors / Indicators Tightness;Sore   Multiple Pain Sites No                         [facility] Adult PT Treatment/Exercise - 05/11/16 0001    Ambulation/Gait   Ambulation Distance (Feet) 113 Feet   Assistive device Straight cane   Ambulation Surface Level;Indoor   Gait Comments Increased stance time on the right, VC to relax the right upper trap,    Knee/Hip Exercises: Aerobic   Nustep L3 x 12 min   Knee/Hip Exercises: Seated   Long Arc Quad Strengthening;Both;3 sets;10 reps   Long Arc Quad Weight 4 lbs.   Ball Squeeze 50x   Marching Strengthening;Both;3 sets;Weights;15 reps   [employer] 4 lbs.   Abd/Adduction Limitations blue band hip abd 50x   Cryotherapy   Number  Minutes Cryotherapy 10 Minutes   Cryotherapy Location Knee   Type of Cryotherapy Ice pack   Ankle Exercises: Seated   Other Seated Ankle Exercises Rocker board ankle PF/DF 3 min                  PT Short Term Goals - 05/02/16 1539    PT SHORT TERM GOAL #1   Title be independent in initial HEP   Time 4   Period Weeks   Status Achieved   PT SHORT TERM GOAL #2   Title improve LE strength to perform sit to stand with moderate UE support   Time 4   Period Weeks   Status Achieved   PT SHORT TERM GOAL #3   Title perform TUG in < or = to 21 seconds to reduce risk of falls   Time 4   Period Weeks   Status Achieved   PT SHORT TERM GOAL #4   Title walk for 10 minutes without significant fatigue   Time 4   Period Weeks   Status On-going           PT Long Term Goals - 05/11/16 1525    PT LONG TERM GOAL #4   Title improve LE strength to perform sit to stand with minimal UE support   Time 8   Status Achieved               Plan - 05/11/16 1448    Clinical Impression Statement Pt increased his walking distance today to 113 feet with standard cane. He demonstrated increased stance time on his RTLE.    Rehab Potential Good   PT Frequency 2x / week   PT Duration 8 weeks   PT Treatment/Interventions ADLs/Self Care Home Management;Cryotherapy;Moist Heat;Therapeutic exercise;Therapeutic activities;Functional mobility training;Stair training;Gait training;Neuromuscular re-education;Patient/family education;Manual techniques;Passive range of motion   Consulted and Agree with Plan of Care Patient      Patient will benefit from skilled therapeutic intervention in order to improve the following deficits and impairments:  Abnormal gait, Difficulty walking, Postural dysfunction, Decreased strength, Decreased mobility, Decreased balance, Impaired flexibility, Improper body mechanics, Decreased activity tolerance, Cardiopulmonary status limiting activity, Decreased  endurance  Visit Diagnosis: Muscle weakness (generalized)  Difficulty in walking, not elsewhere classified  Abscess of abdominal cavity (HCC)  Abscess  Right knee pain     Problem List Patient Active Problem List   Diagnosis Date Noted  . History of DVT (deep vein thrombosis)   . Long-term (current) use of anticoagulants   . Old anterior myocardial infarction   . Sleep apnea   .  Anemia of chronic disease 10/16/2015  . Chronic combined systolic and diastolic congestive heart failure (HCC) 09/25/2015  . Chronic respiratory failure (HCC) 09/17/2015  . 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 Ischemic cardiomyopathy   . CAD (coronary artery disease), native coronary artery   . Morbid obesity (HCC) 05/20/2015  . Benign prostatic hyperplasia with urinary obstruction 04/24/2015  . Chronic LBP 12/05/2013  . Essential hypertension 09/04/2013    Omarii Scalzo, PTA 05/11/2016, 3:42 PM  [facility] [address], [address] [place], [place], [address] Phone: [phone]   Fax:  [phone]  Name: Jonathan HarpsHarold D Krogstad MRN: [id number] Date of Birth: 12/25/1945

## 2016-05-16 ENCOUNTER — Ambulatory Visit: Payer: Medicare Other | Attending: Nurse Practitioner

## 2016-05-16 DIAGNOSIS — M25561 Pain in right knee: Secondary | ICD-10-CM | POA: Diagnosis present

## 2016-05-16 DIAGNOSIS — M6281 Muscle weakness (generalized): Secondary | ICD-10-CM | POA: Diagnosis present

## 2016-05-16 DIAGNOSIS — R262 Difficulty in walking, not elsewhere classified: Secondary | ICD-10-CM | POA: Diagnosis present

## 2016-05-16 DIAGNOSIS — L0291 Cutaneous abscess, unspecified: Secondary | ICD-10-CM | POA: Insufficient documentation

## 2016-05-16 DIAGNOSIS — K651 Peritoneal abscess: Secondary | ICD-10-CM | POA: Insufficient documentation

## 2016-05-16 NOTE — Therapy (Signed)
[facility] [address], [address] [place], [place], [address] Phone: [phone]   Fax:  [phone]  Physical Therapy Treatment  Patient Details  Name: Jonathan Le MRN: [id number] Date of Birth: 10/14/1946 Referring Provider: Elizabeth PalauAnderson, Teresa, NP  Encounter Date: 05/16/2016      PT End of Session - 05/16/16 1534    Visit Number 14   Number of Visits 20   Date for PT Re-Evaluation 05/24/16   Authorization Type Use KX modifier on all visits   PT Start Time 1443   PT Stop Time 1533   PT Time Calculation (min) 50 min   Activity Tolerance Patient tolerated treatment well   Behavior During Therapy [facility] for tasks assessed/performed      Past Medical History  Diagnosis Date  . Anxiety   . Hypertension   . Arthritis   . Sleep apnea     wears CPAP nightly  . Glaucoma   . CHF (congestive heart failure) (HCC)     MI, with open heart surgery x 30 sdays ago per pt. (06/26/15)  . S/P CABG (coronary artery bypass graft)   . MI (myocardial infarction) (HCC)   . DVT (deep venous thrombosis) (HCC)   . Adult situational stress disorder   . Decubital ulcer   . GERD (gastroesophageal reflux disease)   . Anemia   . Chronic low back pain   . ED (erectile dysfunction)   . ETD (eustachian tube dysfunction)   . Ganglion cyst   . Ileostomy in place [facility](HCC)   . Osteoarthritis     Past Surgical History  Procedure Laterality Date  . Hernia repair      right  . Eye surgery      bil cataract  . Wisdom tooth extraction    . Fasciotomy foot / toe Left     foot  . Cardiac catheterization N/A 05/20/2015    Procedure: Left Heart Cath and Coronary Angiography;  Surgeon: Kathleene Hazelhristopher D McAlhany, MD;  [facility];  Service: Cardiovascular;  Laterality: N/A;  . Coronary artery bypass graft N/A 05/22/2015    Procedure: CORONARY ARTERY BYPASS GRAFTING (CABG), ON PUMP, TIMES THREE, USING LEFT INTERNAL MAMMARY ARTERY, RIGHT GREATER  SAPHENOUS VEIN HARVESTED ENDOSCOPICALLY;  Surgeon: Kerin PernaPeter Van Trigt, MD;  [facility];  Service: Open Heart Surgery;  Laterality: N/A;  LIMA-LAD, SVG-OM, SVG-PD  . Tee without cardioversion N/A 05/22/2015    Procedure: TRANSESOPHAGEAL ECHOCARDIOGRAM (TEE);  Surgeon: Kerin PernaPeter Van Trigt, MD;  [facility];  Service: Open Heart Surgery;  Laterality: N/A;  . Laparotomy N/A 06/18/2015    Procedure: EXPLORATORY LAPAROTOMY;  Surgeon: Emelia LoronMatthew Wakefield, MD;  [facility];  Service: General;  Laterality: N/A;  . Laparotomy N/A 06/20/2015    Procedure: WASHOUT OF ABDOMEN, CLOSURE OF ABDOMINAL WALL WITH PLACEMENT OF WOUND VAC AND ILLEOSTOMY ;  Surgeon: Almond LintFaera Byerly, MD;  [facility] OR;  Service: General;  Laterality: N/A;  . Tracheostomy tube placement N/A 07/03/2015    Procedure: TRACHEOSTOMY;  Surgeon: Serena ColonelJefry Rosen, MD;  [facility];  Service: ENT;  Laterality: N/A;    There were no vitals filed for this visit.      Subjective Assessment - 05/16/16 1448    Subjective I think that I overdid it yesterday.    Pertinent History Heart attack: 05/17/15 with CAGB , In and out of hospital and rehab over the past 215 days due to medial complications.  Recent onset of Rt shoulder pain  Currently in Pain? Yes   Pain Score 5    Pain Location Knee   Pain Orientation Right   Pain Descriptors / Indicators Tightness;Sore   Pain Type Acute pain   Pain Onset More than a month ago   Pain Frequency Intermittent   Aggravating Factors  too much standing activity   Pain Relieving Factors rest, ice                         [facility] Adult PT Treatment/Exercise - 05/16/16 0001    Ambulation/Gait   Ambulation Distance (Feet) 77 Feet  Rt knee pain   Assistive device Straight cane   Ambulation Surface Indoor;Level   Knee/Hip Exercises: Aerobic   Nustep L3 x 12 min  seat 10, no arms   Other Aerobic arm bike for endurance and UE strength for transfers: Level 1x 4 minutes (2/2)   Knee/Hip Exercises:  Seated   Long Arc Quad Strengthening;Both;3 sets;10 reps   Long Arc Quad Weight 4 lbs.   Ball Squeeze 50x   Marching Strengthening;Both;3 sets;Weights;15 reps   [employer] 4 lbs.   Abd/Adduction Limitations blue band hip abd 50x   Cryotherapy   Number Minutes Cryotherapy 10 Minutes   Cryotherapy Location Knee   Type of Cryotherapy Ice pack   Ankle Exercises: Seated   Other Seated Ankle Exercises Rocker board ankle PF/DF 3 min                  PT Short Term Goals - 05/02/16 1539    PT SHORT TERM GOAL #1   Title be independent in initial HEP   Time 4   Period Weeks   Status Achieved   PT SHORT TERM GOAL #2   Title improve Le strength to perform sit to stand with moderate UE support   Time 4   Period Weeks   Status Achieved   PT SHORT TERM GOAL #3   Title perform TUG in < or = to 21 seconds to reduce risk of falls   Time 4   Period Weeks   Status Achieved   PT SHORT TERM GOAL #4   Title walk for 10 minutes without significant fatigue   Time 4   Period Weeks   Status On-going           PT Long Term Goals - 05/16/16 1450    PT LONG TERM GOAL #1   Title be independent in advanced HEP   Time 8   Period Weeks   Status On-going   PT LONG TERM GOAL #2   Title improve strength and endurance to walk for 15 minutes without significant fatigue   Time 8   Period Weeks   Status On-going   PT LONG TERM GOAL #3   Title perform TUG in < or = to 18 seconds to reduce falls risk   PT LONG TERM GOAL #4   Title improve Le strength to perform sit to stand with minimal UE support   Status Achieved   PT LONG TERM GOAL #5   Title wean from walker 50% of the time for home distances   Time 8   Period Weeks   Status On-going               Plan - 05/16/16 1452    Clinical Impression Statement Pt with continued endurance and strength deficits.  Pt is able to walk for 5 minutes at home.  Pt continues to use  the walker at home for safety and is using the cane  at times.  Pt reports that he feels 75% improvement.  Pt is working on improving his distance with walking with cane and walks ~113 feet in the clinic.  Pt will continue to benefit from skilled PT for advancement of strength and endurance to improve independence and safey in the community.     Rehab Potential Good   PT Frequency 2x / week   PT Duration 8 weeks   PT Treatment/Interventions ADLs/Self Care Home Management;Cryotherapy;Moist Heat;Therapeutic exercise;Therapeutic activities;Functional mobility training;Stair training;Gait training;Neuromuscular re-education;Patient/family education;Manual techniques;Passive range of motion   PT Next Visit Plan Ambulate with cane, chair exs   Consulted and Agree with Plan of Care Patient      Patient will benefit from skilled therapeutic intervention in order to improve the following deficits and impairments:  Abnormal gait, Difficulty walking, Postural dysfunction, Decreased strength, Decreased mobility, Decreased balance, Impaired flexibility, Improper body mechanics, Decreased activity tolerance, Cardiopulmonary status limiting activity, Decreased endurance  Visit Diagnosis: Muscle weakness (generalized)  Difficulty in walking, not elsewhere classified     Problem List Patient Active Problem List   Diagnosis Date Noted  . History of DVT (deep vein thrombosis)   . Long-term (current) use of anticoagulants   . Old anterior myocardial infarction   . Sleep apnea   . Anemia of chronic disease 10/16/2015  . Chronic combined systolic and diastolic congestive heart failure (HCC) 09/25/2015  . Chronic respiratory failure (HCC) 09/17/2015  . 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 Ischemic cardiomyopathy   . CAD (coronary artery disease), native coronary artery   . Morbid obesity (HCC) 05/20/2015  . Benign prostatic hyperplasia with urinary obstruction 04/24/2015  . Chronic LBP 12/05/2013  . Essential hypertension 09/04/2013    Lorrene ReidKelly Ivelisse Culverhouse,  PT 05/16/2016 3:36 PM  [facility] [address], [address] [place], [place], [address] Phone: [phone]   Fax:  [phone]  Name: Jonathan Le MRN: [id number] Date of Birth: 01/19/1946

## 2016-05-18 ENCOUNTER — Ambulatory Visit: Payer: Medicare Other | Admitting: Physical Therapy

## 2016-05-18 ENCOUNTER — Encounter: Payer: Self-pay | Admitting: Physical Therapy

## 2016-05-18 DIAGNOSIS — R262 Difficulty in walking, not elsewhere classified: Secondary | ICD-10-CM

## 2016-05-18 DIAGNOSIS — M6281 Muscle weakness (generalized): Secondary | ICD-10-CM

## 2016-05-18 NOTE — Therapy (Signed)
[facility] [address], [address] [place], [place], [address] Phone: [phone]   Fax:  [phone]  Physical Therapy Treatment  Patient Details  Name: Jonathan HarpsHarold D Kings MRN: [id number] Date of Birth: 02/18/1946 Referring Provider: Elizabeth PalauAnderson, Teresa, NP  Encounter Date: 05/18/2016      PT End of Session - 05/18/16 1527    Visit Number 15   Number of Visits 20   Date for PT Re-Evaluation 05/24/16   Authorization Type Use KX modifier on all visits   PT Start Time 1434   PT Stop Time 1537   PT Time Calculation (min) 63 min   Activity Tolerance Patient tolerated treatment well  Knees were more painful today      Past Medical History  Diagnosis Date  . Anxiety   . Hypertension   . Arthritis   . Sleep apnea     wears CPAP nightly  . Glaucoma   . CHF (congestive heart failure) (HCC)     MI, with open heart surgery x 30 sdays ago per pt. (06/26/15)  . S/P CABG (coronary artery bypass graft)   . MI (myocardial infarction) (HCC)   . DVT (deep venous thrombosis) (HCC)   . Adult situational stress disorder   . Decubital ulcer   . GERD (gastroesophageal reflux disease)   . Anemia   . Chronic low back pain   . ED (erectile dysfunction)   . ETD (eustachian tube dysfunction)   . Ganglion cyst   . Ileostomy in place [facility](HCC)   . Osteoarthritis     Past Surgical History  Procedure Laterality Date  . Hernia repair      right  . Eye surgery      bil cataract  . Wisdom tooth extraction    . Fasciotomy foot / toe Left     foot  . Cardiac catheterization N/A 05/20/2015    Procedure: Left Heart Cath and Coronary Angiography;  Surgeon: Kathleene Hazelhristopher D McAlhany, MD;  [facility];  Service: Cardiovascular;  Laterality: N/A;  . Coronary artery bypass graft N/A 05/22/2015    Procedure: CORONARY ARTERY BYPASS GRAFTING (CABG), ON PUMP, TIMES THREE, USING LEFT INTERNAL MAMMARY ARTERY, RIGHT GREATER SAPHENOUS VEIN HARVESTED  ENDOSCOPICALLY;  Surgeon: Kerin PernaPeter Van Trigt, MD;  [facility];  Service: Open Heart Surgery;  Laterality: N/A;  LIMA-LAD, SVG-OM, SVG-PD  . Tee without cardioversion N/A 05/22/2015    Procedure: TRANSESOPHAGEAL ECHOCARDIOGRAM (TEE);  Surgeon: Kerin PernaPeter Van Trigt, MD;  [facility];  Service: Open Heart Surgery;  Laterality: N/A;  . Laparotomy N/A 06/18/2015    Procedure: EXPLORATORY LAPAROTOMY;  Surgeon: Emelia LoronMatthew Wakefield, MD;  [facility];  Service: General;  Laterality: N/A;  . Laparotomy N/A 06/20/2015    Procedure: WASHOUT OF ABDOMEN, CLOSURE OF ABDOMINAL WALL WITH PLACEMENT OF WOUND VAC AND ILLEOSTOMY ;  Surgeon: Almond LintFaera Byerly, MD;  [facility] OR;  Service: General;  Laterality: N/A;  . Tracheostomy tube placement N/A 07/03/2015    Procedure: TRACHEOSTOMY;  Surgeon: Serena ColonelJefry Rosen, MD;  [facility];  Service: ENT;  Laterality: N/A;    There were no vitals filed for this visit.      Subjective Assessment - 05/18/16 1449    Subjective Knee still bothersome. Uses walker most of the time at home for ambulation.    Currently in Pain? Yes   Pain Score 5    Pain Location Knee   Pain Orientation Right   Pain Descriptors / Indicators Sore  Aggravating Factors  Weightbearing   Pain Relieving Factors rest, ice   Multiple Pain Sites No                         [facility] Adult PT Treatment/Exercise - 05/18/16 0001    Knee/Hip Exercises: Aerobic   Nustep L3 x 16 min   Knee/Hip Exercises: Standing   Forward Step Up Left;1 set;10 reps;Hand Hold: 2;Step Height: 6"   Forward Step Up Limitations uses more UE than LE   Knee/Hip Exercises: Seated   Long Arc Quad Strengthening;Both;3 sets;10 reps   Long Arc Quad Weight 4 lbs.   Ball Squeeze 50x   Marching Strengthening;Both;3 sets;Weights;15 reps   [place] SouthernMarching Weights 4 lbs.   Abd/Adduction Limitations blue band hip abd 50x   Cryotherapy   Number Minutes Cryotherapy 10 Minutes   Cryotherapy Location Knee   Type of Cryotherapy Ice  pack   Ankle Exercises: Seated   Other Seated Ankle Exercises Rocker board ankle PF/DF 4 min                  PT Short Term Goals - 05/02/16 1539    PT SHORT TERM GOAL #1   Title be independent in initial HEP   Time 4   Period Weeks   Status Achieved   PT SHORT TERM GOAL #2   Title improve LE strength to perform sit to stand with moderate UE support   Time 4   Period Weeks   Status Achieved   PT SHORT TERM GOAL #3   Title perform TUG in < or = to 21 seconds to reduce risk of falls   Time 4   Period Weeks   Status Achieved   PT SHORT TERM GOAL #4   Title walk for 10 minutes without significant fatigue   Time 4   Period Weeks   Status On-going           PT Long Term Goals - 05/16/16 1450    PT LONG TERM GOAL #1   Title be independent in advanced HEP   Time 8   Period Weeks   Status On-going   PT LONG TERM GOAL #2   Title improve strength and endurance to walk for 15 minutes without significant fatigue   Time 8   Period Weeks   Status On-going   PT LONG TERM GOAL #3   Title perform TUG in < or = to 18 seconds to reduce falls risk   PT LONG TERM GOAL #4   Title improve LE strength to perform sit to stand with minimal UE support   Status Achieved   PT LONG TERM GOAL #5   Title wean from walker 50% of the time for home distances   Time 8   Period Weeks   Status On-going               Plan - 05/18/16 1528    Clinical Impression Statement More knee pain today pt reports. He reports as we have increased the distance of his walking he has noticed his knee pain slowly increasing. he requested working on the stairs today to help him get up his home steps betterr. When going up the steps heuses probably 60%-70% UE strength.    PT Next Visit Plan Ambulate with cane, chair exs, work on stairs.   Consulted and Agree with Plan of Care Patient      Patient will benefit from skilled therapeutic intervention in  order to improve the following deficits and  impairments:     Visit Diagnosis: Muscle weakness (generalized)  Difficulty in walking, not elsewhere classified     Problem List Patient Active Problem List   Diagnosis Date Noted  . History of DVT (deep vein thrombosis)   . Long-term (current) use of anticoagulants   . Old anterior myocardial infarction   . Sleep apnea   . Anemia of chronic disease 10/16/2015  . Chronic combined systolic and diastolic congestive heart failure (HCC) 09/25/2015  . Chronic respiratory failure (HCC) 09/17/2015  . 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 Ischemic cardiomyopathy   . CAD (coronary artery disease), native coronary artery   . Morbid obesity (HCC) 05/20/2015  . Benign prostatic hyperplasia with urinary obstruction 04/24/2015  . Chronic LBP 12/05/2013  . Essential hypertension 09/04/2013    Kathlyn Leachman, PTA 05/18/2016, 3:31 PM  [facility] [address], [address] [place], [place], [address] Phone: [phone]   Fax:  [phone]  Name: EVA VALLEE MRN: [id number] Date of Birth: 1946/03/19

## 2016-05-23 ENCOUNTER — Ambulatory Visit: Payer: Medicare Other

## 2016-05-23 DIAGNOSIS — M6281 Muscle weakness (generalized): Secondary | ICD-10-CM

## 2016-05-23 DIAGNOSIS — M25561 Pain in right knee: Secondary | ICD-10-CM

## 2016-05-23 DIAGNOSIS — R262 Difficulty in walking, not elsewhere classified: Secondary | ICD-10-CM

## 2016-05-23 NOTE — Therapy (Signed)
[facility] [address], [address] [place], [place], [address] Phone: [phone]   Fax:  [phone]  Physical Therapy Treatment  Patient Details  Name: Jonathan Le MRN: [id number] Date of Birth: [birth year] Referring Provider: Elizabeth Palau, NP  Encounter Date: 05/23/2016      PT End of Session - 05/23/16 1533    Visit Number 16   Number of Visits 20   Date for PT Re-Evaluation 07/04/16   Authorization Type Use KX modifier on all visits   PT Start Time 1455  late   PT Stop Time 1533   PT Time Calculation (min) 38 min   Activity Tolerance Patient tolerated treatment well   Behavior During Therapy [facility] for tasks assessed/performed      Past Medical History  Diagnosis Date  . Anxiety   . Hypertension   . Arthritis   . Sleep apnea     wears CPAP nightly  . Glaucoma   . CHF (congestive heart failure) (HCC)     MI, with open heart surgery x 30 sdays ago per pt. (06/26/15)  . S/P CABG (coronary artery bypass graft)   . MI (myocardial infarction) (HCC)   . DVT (deep venous thrombosis) (HCC)   . Adult situational stress disorder   . Decubital ulcer   . GERD (gastroesophageal reflux disease)   . Anemia   . Chronic low back pain   . ED (erectile dysfunction)   . ETD (eustachian tube dysfunction)   . Ganglion cyst   . Ileostomy in place [facility])   . Osteoarthritis     Past Surgical History  Procedure Laterality Date  . Hernia repair      right  . Eye surgery      bil cataract  . Wisdom tooth extraction    . Fasciotomy foot / toe Left     foot  . Cardiac catheterization N/A 05/20/2015    Procedure: Left Heart Cath and Coronary Angiography;  Surgeon: Kathleene Hazel, MD;  Location: [facility] INVASIVE CV LAB;  Service: Cardiovascular;  Laterality: N/A;  . Coronary artery bypass graft N/A 05/22/2015    Procedure: CORONARY ARTERY BYPASS GRAFTING (CABG), ON PUMP, TIMES THREE, USING LEFT INTERNAL MAMMARY ARTERY, RIGHT  GREATER SAPHENOUS VEIN HARVESTED ENDOSCOPICALLY;  Surgeon: Kerin Perna, MD;  Location: [facility] OR;  Service: Open Heart Surgery;  Laterality: N/A;  LIMA-LAD, SVG-OM, SVG-PD  . Tee without cardioversion N/A 05/22/2015    Procedure: TRANSESOPHAGEAL ECHOCARDIOGRAM (TEE);  Surgeon: Kerin Perna, MD;  Location: [facility] OR;  Service: Open Heart Surgery;  Laterality: N/A;  . Laparotomy N/A 06/18/2015    Procedure: EXPLORATORY LAPAROTOMY;  Surgeon: Emelia Loron, MD;  Location: [facility] OR;  Service: General;  Laterality: N/A;  . Laparotomy N/A 06/20/2015    Procedure: WASHOUT OF ABDOMEN, CLOSURE OF ABDOMINAL WALL WITH PLACEMENT OF WOUND VAC AND ILLEOSTOMY ;  Surgeon: Almond Lint, MD;  [facility];  Service: General;  Laterality: N/A;  . Tracheostomy tube placement N/A 07/03/2015    Procedure: TRACHEOSTOMY;  Surgeon: Serena Colonel, MD;  Location: [facility] OR;  Service: ENT;  Laterality: N/A;    There were no vitals filed for this visit.      Subjective Assessment - 05/23/16 1457    Subjective Pt is wearing tennis shoes for the first time in 1 year today.     Pertinent History Heart attack: 05/17/15 with CAGB , In and out of hospital and rehab over the past 215 days due to  medial complications.  Recent onset of Rt shoulder pain   Patient Stated Goals improve strength and endurance, stand longer    Currently in Pain? Yes   Pain Score 7    Pain Location Knee   Pain Orientation Right   Pain Descriptors / Indicators Sore   Pain Type Acute pain   Pain Onset More than a month ago   Pain Frequency Intermittent   Aggravating Factors  weightbearing, walking   Pain Relieving Factors rest, ice            [facility] PT Assessment - 05/23/16 0001    Assessment   Medical Diagnosis decreased strength (R53.1)   Onset Date/Surgical Date 05/17/15   Precautions   Precautions Fall   Prior Function   Level of Independence Independent with basic ADLs;Independent with household mobility with device;Requires assistive device for  independence   Cognition   Overall Cognitive Status Within Functional Limits for tasks assessed   Strength   Overall Strength Comments Lt knee 5/5, Rt knee 4+/5 extension, 5/5 flexion, Rt DF 3+/5, Lt DF 4+/5, hip flexion 4/5 tested in sitting.   Ambulation/Gait   Ambulation Distance (Feet) 56 Feet  Rt knee pain   Assistive device Straight cane   Ambulation Surface Indoor;Level   Timed Up and Go Test   TUG Normal TUG   Normal TUG (seconds) 16                               PT Short Term Goals - 05/23/16 1459    PT SHORT TERM GOAL #3   Title perform TUG in < or = to 21 seconds to reduce risk of falls   Status Achieved   PT SHORT TERM GOAL #4   Title walk for 10 minutes without significant fatigue   Time 4   Period Weeks   Status On-going           PT Long Term Goals - 05/23/16 1500    PT LONG TERM GOAL #1   Title be independent in advanced HEP   Time 6   Period Weeks   Status On-going   PT LONG TERM GOAL #2   Title improve strength and endurance to walk for 10 minutes without significant fatigue at home   Time 6   Period Weeks   Status Revised  5 minutes max   PT LONG TERM GOAL #3   Title perform TUG in < or = to 18 seconds to reduce falls risk   Status Achieved   PT LONG TERM GOAL #4   Title improve LE strength to perform sit to stand with minimal UE support   Time 6   Period Weeks   Status On-going  inconsistent   PT LONG TERM GOAL #5   Title wean from walker 50% of the time for home distances   Time 6   Period Weeks   Status On-going  20% use   Additional Long Term Goals   Additional Long Term Goals Yes   PT LONG TERM GOAL #6   Title ambulate in the clinic with the cane 120 feet consistently without need to rest and progress to SBA   Time 6   Period Weeks   Status New               Plan - 05/23/16 1501    Clinical Impression Statement Pt with Rt knee pain that limits mobility.  Pt requires  CGA with ambulation with a  cane in the clinic due to LE weakness and poor foot clearance.  Pt uses walker at home 80% and cane 20% and is limited to walking 5 minutes due to fatigue and limited endurance.  Pt is able to perform sit to stand without UE support and sometimes needs to use arms due to Rt knee pain or LE weakness.  TUG is 16 seconds today.   Pt with significant endurance and strength deficits due to extended illness and immobility associated with illness.  Pt reports a 50% overall improvement in his overall mobility and endurance.  Pt will continue to benefit from skilled PT for endurance, strength, and gait training to improve independence and safety at home and in the community.     Rehab Potential Good   PT Frequency 2x / week   PT Duration 6 weeks   PT Treatment/Interventions ADLs/Self Care Home Management;Cryotherapy;Moist Heat;Therapeutic exercise;Therapeutic activities;Functional mobility training;Stair training;Gait training;Neuromuscular re-education;Patient/family education;Manual techniques;Passive range of motion   PT Next Visit Plan Ambulate with cane, chair exs, work on stairs.   Consulted and Agree with Plan of Care Patient      Patient will benefit from skilled therapeutic intervention in order to improve the following deficits and impairments:  Abnormal gait, Difficulty walking, Postural dysfunction, Decreased strength, Decreased mobility, Decreased balance, Impaired flexibility, Improper body mechanics, Decreased activity tolerance, Cardiopulmonary status limiting activity, Decreased endurance  Visit Diagnosis: Muscle weakness (generalized) - Plan: PT plan of care cert/re-cert  Difficulty in walking, not elsewhere classified - Plan: PT plan of care cert/re-cert  Right knee pain - Plan: PT plan of care cert/re-cert     Problem List Patient Active Problem List   Diagnosis Date Noted  . History of DVT (deep vein thrombosis)   . Long-term (current) use of anticoagulants   . Old anterior  myocardial infarction   . Sleep apnea   . Anemia of chronic disease 10/16/2015  . Chronic combined systolic and diastolic congestive heart failure (HCC) 09/25/2015  . Chronic respiratory failure (HCC) 09/17/2015  . 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 Ischemic cardiomyopathy   . CAD (coronary artery disease), native coronary artery   . Morbid obesity (HCC) 05/20/2015  . Benign prostatic hyperplasia with urinary obstruction 04/24/2015  . Chronic LBP 12/05/2013  . Essential hypertension 09/04/2013     Lorrene ReidKelly Takacs, PT 05/23/2016 3:36 PM  [facility] [address], [address] [place], [place], [address] Phone: [phone]   Fax:  [phone]  Name: Jonathan Le MRN: [id number] Date of Birth: 06/14/1946

## 2016-05-25 ENCOUNTER — Encounter: Payer: Self-pay | Admitting: Physical Therapy

## 2016-05-25 ENCOUNTER — Ambulatory Visit: Payer: Medicare Other | Admitting: Physical Therapy

## 2016-05-25 DIAGNOSIS — K651 Peritoneal abscess: Secondary | ICD-10-CM

## 2016-05-25 DIAGNOSIS — M25561 Pain in right knee: Secondary | ICD-10-CM

## 2016-05-25 DIAGNOSIS — R262 Difficulty in walking, not elsewhere classified: Secondary | ICD-10-CM

## 2016-05-25 DIAGNOSIS — M6281 Muscle weakness (generalized): Secondary | ICD-10-CM

## 2016-05-25 DIAGNOSIS — L0291 Cutaneous abscess, unspecified: Secondary | ICD-10-CM

## 2016-05-25 NOTE — Therapy (Signed)
[facility] [address], [address] [place], [place], [address] Phone: [phone]   Fax:  [phone]  Physical Therapy Treatment  Patient Details  Name: Jonathan Le MRN: [id number] Date of Birth: 27-Sep-1946 Referring Provider: Elizabeth Palau, NP  Encounter Date: 05/25/2016      PT End of Session - 05/25/16 1456    Visit Number 17   Number of Visits 20   Date for PT Re-Evaluation 07/04/16   Authorization Type Use KX modifier on all visits   PT Start Time 1443   PT Stop Time 1540   PT Time Calculation (min) 57 min   Activity Tolerance Patient tolerated treatment well   Behavior During Therapy [facility] for tasks assessed/performed      Past Medical History  Diagnosis Date  . Anxiety   . Hypertension   . Arthritis   . Sleep apnea     wears CPAP nightly  . Glaucoma   . CHF (congestive heart failure) (HCC)     MI, with open heart surgery x 30 sdays ago per pt. (06/26/15)  . S/P CABG (coronary artery bypass graft)   . MI (myocardial infarction) (HCC)   . DVT (deep venous thrombosis) (HCC)   . Adult situational stress disorder   . Decubital ulcer   . GERD (gastroesophageal reflux disease)   . Anemia   . Chronic low back pain   . ED (erectile dysfunction)   . ETD (eustachian tube dysfunction)   . Ganglion cyst   . Ileostomy in place [facility])   . Osteoarthritis     Past Surgical History  Procedure Laterality Date  . Hernia repair      right  . Eye surgery      bil cataract  . Wisdom tooth extraction    . Fasciotomy foot / toe Left     foot  . Cardiac catheterization N/A 05/20/2015    Procedure: Left Heart Cath and Coronary Angiography;  Surgeon: Kathleene Hazel, MD;  Location: [facility] INVASIVE CV LAB;  Service: Cardiovascular;  Laterality: N/A;  . Coronary artery bypass graft N/A 05/22/2015    Procedure: CORONARY ARTERY BYPASS GRAFTING (CABG), ON PUMP, TIMES THREE, USING LEFT INTERNAL MAMMARY ARTERY, RIGHT GREATER  SAPHENOUS VEIN HARVESTED ENDOSCOPICALLY;  Surgeon: Kerin Perna, MD;  Location: [facility] OR;  Service: Open Heart Surgery;  Laterality: N/A;  LIMA-LAD, SVG-OM, SVG-PD  . Tee without cardioversion N/A 05/22/2015    Procedure: TRANSESOPHAGEAL ECHOCARDIOGRAM (TEE);  Surgeon: Kerin Perna, MD;  Location: [facility] OR;  Service: Open Heart Surgery;  Laterality: N/A;  . Laparotomy N/A 06/18/2015    Procedure: EXPLORATORY LAPAROTOMY;  Surgeon: Emelia Loron, MD;  Location: [facility] OR;  Service: General;  Laterality: N/A;  . Laparotomy N/A 06/20/2015    Procedure: WASHOUT OF ABDOMEN, CLOSURE OF ABDOMINAL WALL WITH PLACEMENT OF WOUND VAC AND ILLEOSTOMY ;  Surgeon: Almond Lint, MD;  Location: [facility] OR;  Service: General;  Laterality: N/A;  . Tracheostomy tube placement N/A 07/03/2015    Procedure: TRACHEOSTOMY;  Surgeon: Serena Colonel, MD;  Location: [facility] OR;  Service: ENT;  Laterality: N/A;    There were no vitals filed for this visit.      Subjective Assessment - 05/25/16 1456    Subjective I'm trying to use my cane more at home.                          [facility] Adult PT Treatment/Exercise - 05/25/16 0001  Ambulation/Gait   Ambulation Distance (Feet) 90 Feet  SBA   Assistive device Straight cane   Ambulation Surface Indoor;Level   Knee/Hip Exercises: Aerobic   Nustep L3 x 13:30   Knee/Hip Exercises: Standing   Heel Raises Both;1 set;10 reps  Little movement in the RT, small lift LT   Hip Abduction AROM;Stengthening;Both;10 reps;Knee straight  Relies heavily on the TM bar, VC on posture   Knee/Hip Exercises: Seated   Marching Strengthening;Both;3 sets;15 reps   Marching Weights 4 lbs.   Abd/Adduction Limitations blue band hip abd 50x   Cryotherapy   Number Minutes Cryotherapy 10 Minutes   Cryotherapy Location Knee   Type of Cryotherapy Ice pack                  PT Short Term Goals - 05/23/16 1459    PT SHORT TERM GOAL #3   Title perform TUG in < or = to 21 seconds to reduce  risk of falls   Status Achieved   PT SHORT TERM GOAL #4   Title walk for 10 minutes without significant fatigue   Time 4   Period Weeks   Status On-going           PT Long Term Goals - 05/23/16 1500    PT LONG TERM GOAL #1   Title be independent in advanced HEP   Time 6   Period Weeks   Status On-going   PT LONG TERM GOAL #2   Title improve strength and endurance to walk for 10 minutes without significant fatigue at home   Time 6   Period Weeks   Status Revised  5 minutes max   PT LONG TERM GOAL #3   Title perform TUG in < or = to 18 seconds to reduce falls risk   Status Achieved   PT LONG TERM GOAL #4   Title improve LE strength to perform sit to stand with minimal UE support   Time 6   Period Weeks   Status On-going  inconsistent   PT LONG TERM GOAL #5   Title wean from walker 50% of the time for home distances   Time 6   Period Weeks   Status On-going  20% use   Additional Long Term Goals   Additional Long Term Goals Yes   PT LONG TERM GOAL #6   Title ambulate in the clinic with the cane 120 feet consistently without need to rest and progress to SBA   Time 6   Period Weeks   Status New               Plan - 05/25/16 1457    Clinical Impression Statement Pt still limited in his ability to perform effective standing LE exercises. Pt was bale to walk 90 feet today with SBA. Required ice to RT knee after as this distance was tough on the knee.    Rehab Potential Good   PT Frequency 2x / week   PT Duration 6 weeks   PT Treatment/Interventions ADLs/Self Care Home Management;Cryotherapy;Moist Heat;Therapeutic exercise;Therapeutic activities;Functional mobility training;Stair training;Gait training;Neuromuscular re-education;Patient/family education;Manual techniques;Passive range of motion   PT Next Visit Plan Ambulate with cane, chair exs, work on stairs.   Consulted and Agree with Plan of Care Patient      Patient will benefit from skilled therapeutic  intervention in order to improve the following deficits and impairments:  Abnormal gait, Difficulty walking, Postural dysfunction, Decreased strength, Decreased mobility, Decreased balance, Impaired flexibility, Improper body  mechanics, Decreased activity tolerance, Cardiopulmonary status limiting activity, Decreased endurance  Visit Diagnosis: Muscle weakness (generalized)  Difficulty in walking, not elsewhere classified  Right knee pain  Abscess of abdominal cavity (HCC)  Abscess     Problem List Patient Active Problem List   Diagnosis Date Noted  . History of DVT (deep vein thrombosis)   . Long-term (current) use of anticoagulants   . Old anterior myocardial infarction   . Sleep apnea   . Anemia of chronic disease 10/16/2015  . Chronic combined systolic and diastolic congestive heart failure (HCC) 09/25/2015  . Chronic respiratory failure (HCC) 09/17/2015  . 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 Ischemic cardiomyopathy   . CAD (coronary artery disease), native coronary artery   . Morbid obesity (HCC) 05/20/2015  . Benign prostatic hyperplasia with urinary obstruction 04/24/2015  . Chronic LBP 12/05/2013  . Essential hypertension 09/04/2013    Kanai Berrios, PTA 05/25/2016, 3:31 PM  [facility] [address], [address] [place], [place], [address] Phone: [phone]   Fax:  [phone]  Name: Ardyth HarpsHarold D Weismann MRN: [id number] Date of Birth: 07/01/1946

## 2016-05-26 ENCOUNTER — Encounter: Payer: Medicare Other | Admitting: Internal Medicine

## 2016-05-26 NOTE — Progress Notes (Signed)
ELECTROPHYSIOLOGY CONSULT NOTE  Patient ID: Jonathan HarpsHarold D Moradi, MRN: [id number], DOB/AGE: 71/12/1945 71 y.o. Admit date: (Not on file) Date of Consult: 05/26/2016  Primary Physician: Elizabeth PalauANDERSON,TERESA, FNP Primary Cardiologist:  Consulting Physician   Chief Complaint:    HPI Jonathan Le is a 70 y.o. male  Referred for consideration of an ICD.  He has a history of ischemic heart disease with late presentation of an MI 5/13. He underwent bypass surgery 7/163.  Most recent echocardiogram EF 20% showing no interval improvement  He also has a history of chronic DVT.   Past Medical History  Diagnosis Date  . Anxiety   . Hypertension   . Arthritis   . Sleep apnea     wears CPAP nightly  . Glaucoma   . CHF (congestive heart failure) (HCC)     MI, with open heart surgery x 30 sdays ago per pt. (06/26/15)  . S/P CABG (coronary artery bypass graft)   . MI (myocardial infarction) (HCC)   . DVT (deep venous thrombosis) (HCC)   . Adult situational stress disorder   . Decubital ulcer   . GERD (gastroesophageal reflux disease)   . Anemia   . Chronic low back pain   . ED (erectile dysfunction)   . ETD (eustachian tube dysfunction)   . Ganglion cyst   . Ileostomy in place [facility](HCC)   . Osteoarthritis       Surgical History:  Past Surgical History  Procedure Laterality Date  . Hernia repair      right  . Eye surgery      bil cataract  . Wisdom tooth extraction    . Fasciotomy foot / toe Left     foot  . Cardiac catheterization N/A 05/20/2015    Procedure: Left Heart Cath and Coronary Angiography;  Surgeon: Kathleene Hazelhristopher D McAlhany, MD;  [facility];  Service: Cardiovascular;  Laterality: N/A;  . Coronary artery bypass graft N/A 05/22/2015    Procedure: CORONARY ARTERY BYPASS GRAFTING (CABG), ON PUMP, TIMES THREE, USING LEFT INTERNAL MAMMARY ARTERY, RIGHT GREATER SAPHENOUS VEIN HARVESTED ENDOSCOPICALLY;  Surgeon: Kerin PernaPeter Van Trigt, MD;  [facility];  Service:  Open Heart Surgery;  Laterality: N/A;  LIMA-LAD, SVG-OM, SVG-PD  . Tee without cardioversion N/A 05/22/2015    Procedure: TRANSESOPHAGEAL ECHOCARDIOGRAM (TEE);  Surgeon: Kerin PernaPeter Van Trigt, MD;  [facility];  Service: Open Heart Surgery;  Laterality: N/A;  . Laparotomy N/A 06/18/2015    Procedure: EXPLORATORY LAPAROTOMY;  Surgeon: Emelia LoronMatthew Wakefield, MD;  [facility];  Service: General;  Laterality: N/A;  . Laparotomy N/A 06/20/2015    Procedure: WASHOUT OF ABDOMEN, CLOSURE OF ABDOMINAL WALL WITH PLACEMENT OF WOUND VAC AND ILLEOSTOMY ;  Surgeon: Almond LintFaera Byerly, MD;  Location: [facility] OR;  Service: General;  Laterality: N/A;  . Tracheostomy tube placement N/A 07/03/2015    Procedure: TRACHEOSTOMY;  Surgeon: Serena ColonelJefry Rosen, MD;  Location: [facility] OR;  Service: ENT;  Laterality: N/A;     Home Meds: Prior to Admission medications   Medication Sig Start Date End Date Taking? Authorizing Provider  acetaminophen (TYLENOL) 325 MG tablet Take 2 tablets (650 mg total) by mouth 2 (two) times daily with breakfast and lunch. 09/24/15   Evlyn KannerPamela S Love, PA-C  albuterol (PROVENTIL) (2.5 MG/3ML) 0.083% nebulizer solution Take 3 mLs (2.5 mg total) by nebulization every 4 (four) hours as needed for wheezing. 11/23/15   Esperanza SheetsUlugbek N Buriev, MD  Amino Acids-Protein Hydrolys (FEEDING SUPPLEMENT, PRO-STAT SUGAR FREE  64,) LIQD Take 30 mLs by mouth 3 (three) times daily. 09/24/15   Jacquelynn Cree, PA-C  diclofenac sodium (VOLTAREN) 1 % GEL Apply 2 g topically 4 (four) times daily. 09/24/15   Jacquelynn Cree, PA-C  famotidine (PEPCID) 20 MG tablet Take 1 tablet (20 mg total) by mouth 2 (two) times daily. 09/24/15   Jacquelynn Cree, PA-C  finasteride (PROSCAR) 5 MG tablet Take 1 tablet (5 mg total) by mouth daily. 09/24/15   Jacquelynn Cree, PA-C  furosemide (LASIX) 40 MG tablet Take 1 tablet (40 mg total) by mouth daily. 09/24/15   Jacquelynn Cree, PA-C  HYDROcodone-acetaminophen (NORCO/VICODIN) 5-325 MG tablet Take 1 tablet by mouth every 6 (six) hours  as needed for moderate pain. 11/23/15   Esperanza Sheets, MD  magnesium oxide (MAG-OX) 400 (241.3 MG) MG tablet Take 2 tablets (800 mg total) by mouth 2 (two) times daily. 09/24/15   Jacquelynn Cree, PA-C  Multiple Vitamins-Minerals (DECUBI-VITE PO) Take 1 tablet by mouth daily.    Historical Provider, MD  nutrition supplement, JUVEN, (JUVEN) PACK Take 1 packet by mouth daily.    Historical Provider, MD  pantoprazole (PROTONIX) 40 MG tablet Take 40 mg by mouth daily.    Historical Provider, MD  potassium chloride (K-DUR,KLOR-CON) 10 MEQ tablet Take 30 mEq by mouth 2 (two) times daily.    Historical Provider, MD  tamsulosin (FLOMAX) 0.4 MG CAPS capsule Take 1 capsule (0.4 mg total) by mouth daily after supper. 09/24/15   Jacquelynn Cree, PA-C  warfarin (COUMADIN) 5 MG tablet Take 1 tablet (5 mg total) by mouth daily at 6 PM. 09/24/15   Jacquelynn Cree, PA-C    Allergies:  Allergies  Allergen Reactions  . Unasyn [Ampicillin-Sulbactam Sodium] Hives, Itching and Rash    Diffuse rash not responding to Pepcid and Benadryl.  TDD.  Marland Kitchen Levaquin [Levofloxacin] Nausea Only  . Penicillins Hives    MAR  . Doxycycline Rash    Social History   Social History  . Marital Status: Married    Spouse Name: N/A  . Number of Children: N/A  . Years of Education: N/A   Occupational History  . Not on file.   Social History Main Topics  . Smoking status: Never Smoker   . Smokeless tobacco: Never Used  . Alcohol Use: No  . Drug Use: No  . Sexual Activity: Not Currently   Other Topics Concern  . Not on file   Social History Narrative     Family History  Problem Relation Age of Onset  . Alzheimer's disease Father   . Rheum arthritis Mother      ROS:  Please see the history of present illness.     All other systems reviewed and negative.    Physical Exam: There were no vitals taken for this visit. General: Well developed, well nourished male in no acute distress. Head: Normocephalic, atraumatic,  sclera non-icteric, no xanthomas, nares are without discharge. EENT: normal  Lymph Nodes:  none Neck: Negative for carotid bruits. JVD not elevated. Back:without scoliosis kyphosis Lungs: Clear bilaterally to auscultation without wheezes, rales, or rhonchi. Breathing is unlabored. Heart: RRR with S1 S2. No  /6 systolic murmur . No rubs, or gallops appreciated. Abdomen: Soft, non-tender, non-distended with normoactive bowel sounds. No hepatomegaly. No rebound/guarding. No obvious abdominal masses. Msk:  Strength and tone appear normal for age. Extremities: No clubbing or cyanosis. No + edema.  Distal pedal pulses are 2+ and equal  bilaterally. Skin: Warm and Dry Neuro: Alert and oriented X 3. CN III-XII intact Grossly normal sensory and motor function . Psych:  Responds to questions appropriately with a normal affect.      Labs: Cardiac Enzymes No results for input(s): CKTOTAL, CKMB, TROPONINI in the last 72 hours. CBC Lab Results  Component Value Date   WBC 4.5 11/23/2015   HGB 10.5* 11/23/2015   HCT 33.9* 11/23/2015   MCV 91.6 11/23/2015   PLT 235 11/23/2015   PROTIME: No results for input(s): LABPROT, INR in the last 72 hours. Chemistry No results for input(s): NA, K, CL, CO2, BUN, CREATININE, CALCIUM, PROT, BILITOT, ALKPHOS, ALT, AST, GLUCOSE in the last 168 hours.  Invalid input(s): LABALBU Lipids Lab Results  Component Value Date   CHOL 121 05/20/2015   HDL 40* 05/20/2015   LDLCALC 69 05/20/2015   TRIG 137 06/30/2015   BNP No results found for: PROBNP Thyroid Function Tests: No results for input(s): TSH, T4TOTAL, T3FREE, THYROIDAB in the last 72 hours.  Invalid input(s): FREET3 Miscellaneous No results found for: DDIMER  Radiology/Studies:  No results found.  EKG:    Assessment and Plan: Sherryl Manges

## 2016-05-27 ENCOUNTER — Encounter: Payer: Self-pay | Admitting: Internal Medicine

## 2016-05-29 ENCOUNTER — Other Ambulatory Visit: Payer: Self-pay | Admitting: Cardiothoracic Surgery

## 2016-05-29 ENCOUNTER — Other Ambulatory Visit: Payer: Self-pay | Admitting: Physician Assistant

## 2016-05-30 ENCOUNTER — Encounter: Payer: Self-pay | Admitting: Physical Therapy

## 2016-05-30 ENCOUNTER — Ambulatory Visit: Payer: Medicare Other | Admitting: Physical Therapy

## 2016-05-30 DIAGNOSIS — K651 Peritoneal abscess: Secondary | ICD-10-CM

## 2016-05-30 DIAGNOSIS — R262 Difficulty in walking, not elsewhere classified: Secondary | ICD-10-CM

## 2016-05-30 DIAGNOSIS — M6281 Muscle weakness (generalized): Secondary | ICD-10-CM | POA: Diagnosis not present

## 2016-05-30 DIAGNOSIS — L0291 Cutaneous abscess, unspecified: Secondary | ICD-10-CM

## 2016-05-30 DIAGNOSIS — M25561 Pain in right knee: Secondary | ICD-10-CM

## 2016-05-30 NOTE — Therapy (Signed)
[facility] [address], [address] [place], [place], [address] Phone: [phone]   Fax:  [phone]  Physical Therapy Treatment  Patient Details  Name: Jonathan HarpsHarold D Sheeler MRN: [id number] Date of Birth: 06/02/1946 Referring Provider: Elizabeth PalauAnderson, Teresa, NP  Encounter Date: 05/30/2016      PT End of Session - 05/30/16 1405    Visit Number 18   Number of Visits 20   Date for PT Re-Evaluation 07/04/16   Authorization Type Use KX modifier on all visits   PT Start Time 1400   PT Stop Time 1457   PT Time Calculation (min) 57 min   Activity Tolerance Patient tolerated treatment well   Behavior During Therapy [facility] for tasks assessed/performed      Past Medical History  Diagnosis Date  . Anxiety   . Hypertension   . Arthritis   . Sleep apnea     wears CPAP nightly  . Glaucoma   . CHF (congestive heart failure) (HCC)     MI, with open heart surgery x 30 sdays ago per pt. (06/26/15)  . S/P CABG (coronary artery bypass graft)   . MI (myocardial infarction) (HCC)   . DVT (deep venous thrombosis) (HCC)   . Adult situational stress disorder   . Decubital ulcer   . GERD (gastroesophageal reflux disease)   . Anemia   . Chronic low back pain   . ED (erectile dysfunction)   . ETD (eustachian tube dysfunction)   . Ganglion cyst   . Ileostomy in place [facility](HCC)   . Osteoarthritis     Past Surgical History  Procedure Laterality Date  . Hernia repair      right  . Eye surgery      bil cataract  . Wisdom tooth extraction    . Fasciotomy foot / toe Left     foot  . Cardiac catheterization N/A 05/20/2015    Procedure: Left Heart Cath and Coronary Angiography;  Surgeon: Kathleene Hazelhristopher D McAlhany, MD;  [facility];  Service: Cardiovascular;  Laterality: N/A;  . Coronary artery bypass graft N/A 05/22/2015    Procedure: CORONARY ARTERY BYPASS GRAFTING (CABG), ON PUMP, TIMES THREE, USING LEFT INTERNAL MAMMARY ARTERY, RIGHT GREATER  SAPHENOUS VEIN HARVESTED ENDOSCOPICALLY;  Surgeon: Kerin PernaPeter Van Trigt, MD;  [facility];  Service: Open Heart Surgery;  Laterality: N/A;  LIMA-LAD, SVG-OM, SVG-PD  . Tee without cardioversion N/A 05/22/2015    Procedure: TRANSESOPHAGEAL ECHOCARDIOGRAM (TEE);  Surgeon: Kerin PernaPeter Van Trigt, MD;  [facility];  Service: Open Heart Surgery;  Laterality: N/A;  . Laparotomy N/A 06/18/2015    Procedure: EXPLORATORY LAPAROTOMY;  Surgeon: Emelia LoronMatthew Wakefield, MD;  [facility];  Service: General;  Laterality: N/A;  . Laparotomy N/A 06/20/2015    Procedure: WASHOUT OF ABDOMEN, CLOSURE OF ABDOMINAL WALL WITH PLACEMENT OF WOUND VAC AND ILLEOSTOMY ;  Surgeon: Almond LintFaera Byerly, MD;  Location: [facility] OR;  Service: General;  Laterality: N/A;  . Tracheostomy tube placement N/A 07/03/2015    Procedure: TRACHEOSTOMY;  Surgeon: Serena ColonelJefry Rosen, MD;  [facility];  Service: ENT;  Laterality: N/A;    There were no vitals filed for this visit.      Subjective Assessment - 05/30/16 1403    Subjective My RT shoulder is "snappy" today. RT knee swelling is down today.    Currently in Pain? Yes   Pain Score 7    Pain Location Shoulder   Pain Orientation Right   Pain Descriptors / Indicators  Aching   Aggravating Factors  using my shoulder   Pain Relieving Factors rest   Multiple Pain Sites No                         [facility] Adult PT Treatment/Exercise - 05/30/16 0001    Ambulation/Gait   Ambulation Distance (Feet) 90 Feet  SBA   Assistive device Straight cane   Ambulation Surface Level;Indoor   Knee/Hip Exercises: Aerobic   Nustep L3 x 13   Knee/Hip Exercises: Standing   Hip Abduction AROM;Stengthening;Right;2 sets;10 reps   Other Standing Knee Exercises Weighshifting 3 ways 10 min each   Knee/Hip Exercises: Seated   Ball Squeeze 50x   Marching Strengthening;Both;3 sets;15 reps   Marching Weights 4 lbs.   Abd/Adduction Limitations blue band hip abd 50x   Cryotherapy   Number Minutes Cryotherapy 10  Minutes   Cryotherapy Location Knee   Type of Cryotherapy Ice pack                  PT Short Term Goals - 05/23/16 1459    PT SHORT TERM GOAL #3   Title perform TUG in < or = to 21 seconds to reduce risk of falls   Status Achieved   PT SHORT TERM GOAL #4   Title walk for 10 minutes without significant fatigue   Time 4   Period Weeks   Status On-going           PT Long Term Goals - 05/30/16 1448    PT LONG TERM GOAL #1   Title be independent in advanced HEP   Time 6   Period Weeks   Status On-going   PT LONG TERM GOAL #5   Title wean from walker 50% of the time for home distances   Time 6   Period Weeks   Status On-going  10% unless he has to get to the bathroom quickly is always cane.   PT LONG TERM GOAL #6   Title ambulate in the clinic with the cane 120 feet consistently without need to rest and progress to SBA   Time 6   Period Weeks   Status On-going  90 feet, SBA               Plan - 05/30/16 1406    Clinical Impression Statement Pt able to demonstrate improved control with his standing hip abduction rather than 'throwing" the leg. Swelling is down in RTLE today. Encouraged pt to speak with MD regarding RT shoulder pain.     Rehab Potential Good   PT Frequency 2x / week   PT Treatment/Interventions ADLs/Self Care Home Management;Cryotherapy;Moist Heat;Therapeutic exercise;Therapeutic activities;Functional mobility training;Stair training;Gait training;Neuromuscular re-education;Patient/family education;Manual techniques;Passive range of motion   PT Next Visit Plan Ambulate with cane, chair exs, weightshifting in standing   Consulted and Agree with Plan of Care Patient      Patient will benefit from skilled therapeutic intervention in order to improve the following deficits and impairments:  Abnormal gait, Difficulty walking, Postural dysfunction, Decreased strength, Decreased mobility, Decreased balance, Impaired flexibility, Improper body  mechanics, Decreased activity tolerance, Cardiopulmonary status limiting activity, Decreased endurance  Visit Diagnosis: Muscle weakness (generalized)  Difficulty in walking, not elsewhere classified  Right knee pain  Abscess of abdominal cavity (HCC)  Abscess     Problem List Patient Active Problem List   Diagnosis Date Noted  . History of DVT (deep vein thrombosis)   . Long-term (current) use  of anticoagulants   . Old anterior myocardial infarction   . Sleep apnea   . Anemia of chronic disease 10/16/2015  . Chronic combined systolic and diastolic congestive heart failure (HCC) 09/25/2015  . Chronic respiratory failure (HCC) 09/17/2015  . 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 Ischemic cardiomyopathy   . CAD (coronary artery disease), native coronary artery   . Morbid obesity (HCC) 05/20/2015  . Benign prostatic hyperplasia with urinary obstruction 04/24/2015  . Chronic LBP 12/05/2013  . Essential hypertension 09/04/2013    Burman Bruington, PTA 05/30/2016, 2:51 PM  [facility] [address], [address] [place], [place], [address] Phone: [phone]   Fax:  [phone]  Name: DIOGO ANNE MRN: [id number] Date of Birth: 02-02-1946

## 2016-06-01 ENCOUNTER — Ambulatory Visit: Payer: Medicare Other | Admitting: Physical Therapy

## 2016-06-01 ENCOUNTER — Encounter: Payer: Self-pay | Admitting: Physical Therapy

## 2016-06-01 DIAGNOSIS — M6281 Muscle weakness (generalized): Secondary | ICD-10-CM | POA: Diagnosis not present

## 2016-06-01 DIAGNOSIS — R262 Difficulty in walking, not elsewhere classified: Secondary | ICD-10-CM

## 2016-06-01 DIAGNOSIS — L0291 Cutaneous abscess, unspecified: Secondary | ICD-10-CM

## 2016-06-01 DIAGNOSIS — M25561 Pain in right knee: Secondary | ICD-10-CM

## 2016-06-01 DIAGNOSIS — K651 Peritoneal abscess: Secondary | ICD-10-CM

## 2016-06-01 NOTE — Therapy (Signed)
[facility] [address] [place], [place], [address] Phone: [phone]   Fax:  [phone]  Physical Therapy Treatment  Patient Details  Name: Jonathan Le MRN: [id number] Date of Birth: Feb 15, 1946 Referring Provider: Elizabeth Palau, NP  Encounter Date: 06/01/2016      PT End of Session - 06/01/16 1414    Visit Number 19   Number of Visits 20   Date for PT Re-Evaluation 07/04/16   Authorization Type Use KX modifier on all visits   PT Start Time 1402   PT Stop Time 1505   PT Time Calculation (min) 63 min   Activity Tolerance Patient limited by pain;Patient limited by fatigue  More RT knee/leg pain coming into PT today   Behavior During Therapy [facility] for tasks assessed/performed      Past Medical History  Diagnosis Date  . Anxiety   . Hypertension   . Arthritis   . Sleep apnea     wears CPAP nightly  . Glaucoma   . CHF (congestive heart failure) (HCC)     MI, with open heart surgery x 30 sdays ago per pt. (06/26/15)  . S/P CABG (coronary artery bypass graft)   . MI (myocardial infarction) (HCC)   . DVT (deep venous thrombosis) (HCC)   . Adult situational stress disorder   . Decubital ulcer   . GERD (gastroesophageal reflux disease)   . Anemia   . Chronic low back pain   . ED (erectile dysfunction)   . ETD (eustachian tube dysfunction)   . Ganglion cyst   . Ileostomy in place [facility])   . Osteoarthritis     Past Surgical History  Procedure Laterality Date  . Hernia repair      right  . Eye surgery      bil cataract  . Wisdom tooth extraction    . Fasciotomy foot / toe Left     foot  . Cardiac catheterization N/A 05/20/2015    Procedure: Left Heart Cath and Coronary Angiography;  Surgeon: Kathleene Hazel, MD;  [facility]VAS[facility] CV LAB;  Service: Cardiovascular;  Laterality: N/A;  . Coronary artery bypass graft N/A 05/22/2015    Procedure: CORONARY ARTERY BYPASS GRAFTING (CABG), ON PUMP,  TIMES THREE, USING LEFT INTERNAL MAMMARY ARTERY, RIGHT GREATER SAPHENOUS VEIN HARVESTED ENDOSCOPICALLY;  Surgeon: Kerin Perna, MD;  Location[facility] OR;  Service: Open Heart Surgery;  Laterality: N/A;  LIMA-LAD, SVG-OM, SVG-PD  . Tee without cardioversion N/A 05/22/2015    Procedure: TRANSESOPHAGEAL ECHOCARDIOGRAM (TEE);  Surgeon: Kerin Perna, MD;  Location: [facility] OR;  Service: Open Heart Surgery;  Laterality: N/A;  . Laparotomy N/A 06/18/2015    Procedure: EXPLORATORY LAPAROTOMY;  Surgeon: Emelia Loron, MD;  Location: [facility] OR;  Service: General;  Laterality: N/A;  . Laparotomy N/A 06/20/2015    Procedure: WASHOUT OF ABDOMEN, CLOSURE OF ABDOMINAL WALL WITH PLACEMENT OF WOUND VAC AND ILLEOSTOMY ;  Surgeon: Almond Lint, MD;  Location: [facility] OR;  Service: General;  Laterality: N/A;  . Tracheostomy tube placement N/A 07/03/2015    Procedure: TRACHEOSTOMY;  Surgeon: Serena Colonel, MD;  Location: [facility] OR;  Service: ENT;  Laterality: N/A;    There were no vitals filed for this visit.      Subjective Assessment - 06/01/16 1412    Subjective Yesterday was the best day I've had in awhile. Today, my RTLE is killing me. Pt wheeled into clinic today vs walking.    Currently in Pain? Yes  Pain Score 5    Pain Location Knee   Pain Orientation Right   Pain Descriptors / Indicators Tender;Sore   Multiple Pain Sites No                         [facility] Adult PT Treatment/Exercise - 06/01/16 0001    Ambulation/Gait   Ambulation Distance (Feet) 30 Feet   Assistive device Straight cane   Ambulation Surface Level;Indoor  SBA   Gait Comments KNee pain  limiting today as well as fatigue.   Knee/Hip Exercises: Aerobic   Nustep L3 x 13   Knee/Hip Exercises: Standing   Heel Raises --  Weight shifting fwrd/bkward 2 x10 holding onto bar   Hip Abduction AROM;Stengthening;Both;2 sets;10 reps   Knee/Hip Exercises: Seated   Long Arc Quad Strengthening;Both;3 sets;10 reps   Long Arc Quad Weight 0 lbs.   Decreased weight due to knee pain today.   Ball Squeeze 50x   Marching Strengthening;Both;3 sets;15 reps   [employer] 4 lbs.   Abd/Adduction Limitations blue band hip abd 50x   Cryotherapy   Number Minutes Cryotherapy 10 Minutes   Cryotherapy Location Knee   Type of Cryotherapy Ice pack   Ankle Exercises: Seated   Other Seated Ankle Exercises Rocker board {PF/DF 3 min                  PT Short Term Goals - 05/23/16 1459    PT SHORT TERM GOAL #3   Title perform TUG in < or = to 21 seconds to reduce risk of falls   Status Achieved   PT SHORT TERM GOAL #4   Title walk for 10 minutes without significant fatigue   Time 4   Period Weeks   Status On-going           PT Long Term Goals - 05/30/16 1448    PT LONG TERM GOAL #1   Title be independent in advanced HEP   Time 6   Period Weeks   Status On-going   PT LONG TERM GOAL #5   Title wean from walker 50% of the time for home distances   Time 6   Period Weeks   Status On-going  10% unless he has to get to the bathroom quickly is always cane.   PT LONG TERM GOAL #6   Title ambulate in the clinic with the cane 120 feet consistently without need to rest and progress to SBA   Time 6   Period Weeks   Status On-going  90 feet, SBA               Plan - 06/01/16 1434    Clinical Impression Statement Pt presents today with increased knee pain, was not able to walk as much bc of this pain. He was able to accomplish more reps/sets with standing exercises today.  Not much walking in todays session  due to the pain in the knee.    Rehab Potential Good   PT Frequency 2x / week   PT Duration 6 weeks   PT Treatment/Interventions ADLs/Self Care Home Management;Cryotherapy;Moist Heat;Therapeutic exercise;Therapeutic activities;Functional mobility training;Stair training;Gait training;Neuromuscular re-education;Patient/family education;Manual techniques;Passive range of motion   PT Next Visit Plan Ambulate with  cane, chair exs, weightshifting in standing   Consulted and Agree with Plan of Care Patient      Patient will benefit from skilled therapeutic intervention in order to improve the following deficits and impairments:  Abnormal gait,  Difficulty walking, Postural dysfunction, Decreased strength, Decreased mobility, Decreased balance, Impaired flexibility, Improper body mechanics, Decreased activity tolerance, Cardiopulmonary status limiting activity, Decreased endurance  Visit Diagnosis: Muscle weakness (generalized)  Difficulty in walking, not elsewhere classified  Right knee pain  Abscess of abdominal cavity (HCC)  Abscess     Problem List Patient Active Problem List   Diagnosis Date Noted  . History of DVT (deep vein thrombosis)   . Long-term (current) use of anticoagulants   . Old anterior myocardial infarction   . Sleep apnea   . Anemia of chronic disease 10/16/2015  . Chronic combined systolic and diastolic congestive heart failure (HCC) 09/25/2015  . Chronic respiratory failure (HCC) 09/17/2015  . 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 Ischemic cardiomyopathy   . CAD (coronary artery disease), native coronary artery   . Morbid obesity (HCC) 05/20/2015  . Benign prostatic hyperplasia with urinary obstruction 04/24/2015  . Chronic LBP 12/05/2013  . Essential hypertension 09/04/2013    Kellyanne Ellwanger, PTA 06/01/2016, 2:55 PM  [facility] [address] [place], [place], [address] Phone: [phone]   Fax:  [phone]  Name: Jonathan Le MRN: [id number] Date of Birth: 12/27/1945

## 2016-06-06 ENCOUNTER — Ambulatory Visit: Payer: Medicare Other | Admitting: Physical Therapy

## 2016-06-06 ENCOUNTER — Encounter: Payer: Self-pay | Admitting: Physical Therapy

## 2016-06-06 DIAGNOSIS — M6281 Muscle weakness (generalized): Secondary | ICD-10-CM

## 2016-06-06 DIAGNOSIS — M25561 Pain in right knee: Secondary | ICD-10-CM

## 2016-06-06 DIAGNOSIS — K651 Peritoneal abscess: Secondary | ICD-10-CM

## 2016-06-06 DIAGNOSIS — R262 Difficulty in walking, not elsewhere classified: Secondary | ICD-10-CM

## 2016-06-06 DIAGNOSIS — L0291 Cutaneous abscess, unspecified: Secondary | ICD-10-CM

## 2016-06-06 NOTE — Therapy (Signed)
[facility] [address] [place], [place], [address] Phone: [phone]   Fax:  [phone]  Physical Therapy Treatment  Patient Details  Name: Jonathan Le MRN: [id number] Date of Birth: 16-Aug-1946 Referring Provider: Elizabeth Palau, NP  Encounter Date: 06/06/2016      PT End of Session - 06/06/16 1450    Visit Number 20   Number of Visits 20   Date for PT Re-Evaluation 07/04/16   Authorization Type Use KX modifier on all visits   PT Start Time 1446   PT Stop Time 1550   PT Time Calculation (min) 64 min   Activity Tolerance Patient limited by pain  knee pain   Behavior During Therapy [facility] for tasks assessed/performed      Past Medical History:  Diagnosis Date  . Adult situational stress disorder   . Anemia   . Anxiety   . Arthritis   . CHF (congestive heart failure) (HCC)    MI, with open heart surgery x 30 sdays ago per pt. (06/26/15)  . Chronic low back pain   . Decubital ulcer   . DVT (deep venous thrombosis) (HCC)   . ED (erectile dysfunction)   . ETD (eustachian tube dysfunction)   . Ganglion cyst   . GERD (gastroesophageal reflux disease)   . Glaucoma   . Hypertension   . Ileostomy in place [facility])   . MI (myocardial infarction) (HCC)   . Osteoarthritis   . S/P CABG (coronary artery bypass graft)   . Sleep apnea    wears CPAP nightly    Past Surgical History:  Procedure Laterality Date  . CARDIAC CATHETERIZATION N/A 05/20/2015   Procedure: Left Heart Cath and Coronary Angiography;  Surgeon: Kathleene Hazel, MD;  Location: [facility] INVASIVE CV LAB;  Service: Cardiovascular;  Laterality: N/A;  . CORONARY ARTERY BYPASS GRAFT N/A 05/22/2015   Procedure: CORONARY ARTERY BYPASS GRAFTING (CABG), ON PUMP, TIMES THREE, USING LEFT INTERNAL MAMMARY ARTERY, RIGHT GREATER SAPHENOUS VEIN HARVESTED ENDOSCOPICALLY;  Surgeon: Kerin Perna, MD;  Location: [facility] OR;  Service: Open Heart Surgery;  Laterality:  N/A;  LIMA-LAD, SVG-OM, SVG-PD  . EYE SURGERY     bil cataract  . FASCIOTOMY FOOT / TOE Left    foot  . HERNIA REPAIR     right  . LAPAROTOMY N/A 06/18/2015   Procedure: EXPLORATORY LAPAROTOMY;  Surgeon: Emelia Loron, MD;  Location: [facility] OR;  Service: General;  Laterality: N/A;  . LAPAROTOMY N/A 06/20/2015   Procedure: WASHOUT OF ABDOMEN, CLOSURE OF ABDOMINAL WALL WITH PLACEMENT OF WOUND VAC AND ILLEOSTOMY ;  Surgeon: Almond Lint, MD;  Location: [facility] OR;  Service: General;  Laterality: N/A;  . TEE WITHOUT CARDIOVERSION N/A 05/22/2015   Procedure: TRANSESOPHAGEAL ECHOCARDIOGRAM (TEE);  Surgeon: Kerin Perna, MD;  Location[facility] OR;  Service: Open Heart Surgery;  Laterality: N/A;  . TRACHEOSTOMY TUBE PLACEMENT N/A 07/03/2015   Procedure: TRACHEOSTOMY;  Surgeon: Serena Colonel, MD;  Location: [facility] OR;  Service: ENT;  Laterality: N/A;  . WISDOM TOOTH EXTRACTION      There were no vitals filed for this visit.      Subjective Assessment - 06/06/16 1452    Subjective I did not try any standing exercises this weekend. My RT knee pain is limiting my standing.   Currently in Pain? Yes   Pain Score 6    Pain Location Knee   Pain Orientation Right   Pain Descriptors / Indicators Sore   Aggravating  Factors  A lot of walking will increase my knee pain   Pain Relieving Factors rest   Multiple Pain Sites No            [facility] PT Assessment - 06/06/16 0001      Timed Up and Go Test   Normal TUG (seconds) 13                     [facility] Adult PT Treatment/Exercise - 06/06/16 0001      Ambulation/Gait   Ambulation Distance (Feet) 160 Feet   Assistive device Rolling walker  Focus: better posture and faster speed. Was able to do well.     Knee/Hip Exercises: Aerobic   Nustep L3 x 14 min     Knee/Hip Exercises: Seated   Long Cytogeneticist   Long Arc Quad Weight 4 lbs.   Ball Squeeze 50x   Marching Strengthening;Both;3 sets;15 reps   [employer] 4 lbs.    Abd/Adduction Limitations blue band hip abd 50x     Cryotherapy   Number Minutes Cryotherapy 10 Minutes   Cryotherapy Location Knee   Type of Cryotherapy Ice pack                PT Education - 06/06/16 1514    Education provided Yes   Education Details Seatewd blue band clamshells for HEP advancement   Person(s) Educated Patient   Methods Explanation;Demonstration   Comprehension Verbalized understanding;Returned demonstration          PT Short Term Goals - 05/23/16 1459      PT SHORT TERM GOAL #3   Title perform TUG in < or = to 21 seconds to reduce risk of falls   Status Achieved     PT SHORT TERM GOAL #4   Title walk for 10 minutes without significant fatigue   Time 4   Period Weeks   Status On-going           PT Long Term Goals - 05/30/16 1448      PT LONG TERM GOAL #1   Title be independent in advanced HEP   Time 6   Period Weeks   Status On-going     PT LONG TERM GOAL #5   Title wean from walker 50% of the time for home distances   Time 6   Period Weeks   Status On-going  10% unless he has to get to the bathroom quickly is always cane.     PT LONG TERM GOAL #6   Title ambulate in the clinic with the cane 120 feet consistently without need to rest and progress to SBA   Time 6   Period Weeks   Status On-going  90 feet, SBA               Plan - 06/06/16 1451    Clinical Impression Statement Presents again with knee pain, but despite the pain today, he was able to decrease his TUG time by 3 seconds. he was also able to walk taller with better posture and faster today using his walker.    Rehab Potential Good   PT Frequency 2x / week   PT Duration 6 weeks   PT Treatment/Interventions ADLs/Self Care Home Management;Cryotherapy;Moist Heat;Therapeutic exercise;Therapeutic activities;Functional mobility training;Stair training;Gait training;Neuromuscular re-education;Patient/family education;Manual techniques;Passive range of motion   PT  Next Visit Plan Ambulate with cane, chair exs, weightshifting in standing   Consulted and Agree with Plan of Care Patient  Patient will benefit from skilled therapeutic intervention in order to improve the following deficits and impairments:  Abnormal gait, Difficulty walking, Postural dysfunction, Decreased strength, Decreased mobility, Decreased balance, Impaired flexibility, Improper body mechanics, Decreased activity tolerance, Cardiopulmonary status limiting activity, Decreased endurance  Visit Diagnosis: Muscle weakness (generalized)  Difficulty in walking, not elsewhere classified  Right knee pain  Abscess of abdominal cavity (HCC)  Abscess       G-Codes - 07-06-16 1447    Functional Assessment Tool Used TUG and clinical   Functional Limitation Mobility: Walking and moving around   Mobility: Walking and Moving Around Current Status [phone]) At least 40 percent but less than 60 percent impaired, limited or restricted   Mobility: Walking and Moving Around Goal Status [phone]) At least 40 percent but less than 60 percent impaired, limited or restricted      Problem List Patient Active Problem List   Diagnosis Date Noted  . History of DVT (deep vein thrombosis)   . Long-term (current) use of anticoagulants   . Old anterior myocardial infarction   . Sleep apnea   . Anemia of chronic disease 10/16/2015  . Chronic combined systolic and diastolic congestive heart failure (HCC) 09/25/2015  . Chronic respiratory failure (HCC) 09/17/2015  . 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 Ischemic cardiomyopathy   . CAD (coronary artery disease), native coronary artery   . Morbid obesity (HCC) 05/20/2015  . Benign prostatic hyperplasia with urinary obstruction 04/24/2015  . Chronic LBP 12/05/2013  . Essential hypertension 09/04/2013    Nyjah Schwake, PTA 2016-07-06, 3:41 PM  [facility] [address], [address] [place], [place],  [address] Phone: [phone]   Fax:  [phone]  Name: REUVEN BRAVER MRN: [id number] Date of Birth: 12-26-1945

## 2016-06-08 ENCOUNTER — Ambulatory Visit: Payer: Medicare Other | Admitting: Physical Therapy

## 2016-06-08 ENCOUNTER — Encounter: Payer: Self-pay | Admitting: Physical Therapy

## 2016-06-08 DIAGNOSIS — M6281 Muscle weakness (generalized): Secondary | ICD-10-CM

## 2016-06-08 DIAGNOSIS — R262 Difficulty in walking, not elsewhere classified: Secondary | ICD-10-CM

## 2016-06-08 NOTE — Therapy (Signed)
[facility] [address], [address] [place], [place], [address] Phone: [phone]   Fax:  [phone]  Physical Therapy Treatment  Patient Details  Name: Jonathan Le MRN: [id number] Date of Birth: May 14, 1946 Referring Provider: Elizabeth Palau, NP  Encounter Date: 06/08/2016      PT End of Session - 06/08/16 1518    Visit Number 21   Number of Visits 30   Date for PT Re-Evaluation 07/04/16   Authorization Type Use KX modifier on all visits   PT Start Time 1445   PT Stop Time 1525   PT Time Calculation (min) 40 min   Equipment Utilized During Treatment Gait belt   Activity Tolerance Patient tolerated treatment well   Behavior During Therapy [facility] for tasks assessed/performed      Past Medical History:  Diagnosis Date  . Adult situational stress disorder   . Anemia   . Anxiety   . Arthritis   . CHF (congestive heart failure) (HCC)    MI, with open heart surgery x 30 sdays ago per pt. (06/26/15)  . Chronic low back pain   . Decubital ulcer   . DVT (deep venous thrombosis) (HCC)   . ED (erectile dysfunction)   . ETD (eustachian tube dysfunction)   . Ganglion cyst   . GERD (gastroesophageal reflux disease)   . Glaucoma   . Hypertension   . Ileostomy in place [facility])   . MI (myocardial infarction) (HCC)   . Osteoarthritis   . S/P CABG (coronary artery bypass graft)   . Sleep apnea    wears CPAP nightly    Past Surgical History:  Procedure Laterality Date  . CARDIAC CATHETERIZATION N/A 05/20/2015   Procedure: Left Heart Cath and Coronary Angiography;  Surgeon: Kathleene Hazel, MD;  Location: [facility] INVASIVE CV LAB;  Service: Cardiovascular;  Laterality: N/A;  . CORONARY ARTERY BYPASS GRAFT N/A 05/22/2015   Procedure: CORONARY ARTERY BYPASS GRAFTING (CABG), ON PUMP, TIMES THREE, USING LEFT INTERNAL MAMMARY ARTERY, RIGHT GREATER SAPHENOUS VEIN HARVESTED ENDOSCOPICALLY;  Surgeon: Kerin Perna, MD;  Location: [facility]  OR;  Service: Open Heart Surgery;  Laterality: N/A;  LIMA-LAD, SVG-OM, SVG-PD  . EYE SURGERY     bil cataract  . FASCIOTOMY FOOT / TOE Left    foot  . HERNIA REPAIR     right  . LAPAROTOMY N/A 06/18/2015   Procedure: EXPLORATORY LAPAROTOMY;  Surgeon: Emelia Loron, MD;  Location: [facility] OR;  Service: General;  Laterality: N/A;  . LAPAROTOMY N/A 06/20/2015   Procedure: WASHOUT OF ABDOMEN, CLOSURE OF ABDOMINAL WALL WITH PLACEMENT OF WOUND VAC AND ILLEOSTOMY ;  Surgeon: Almond Lint, MD;  Location: [facility] OR;  Service: General;  Laterality: N/A;  . TEE WITHOUT CARDIOVERSION N/A 05/22/2015   Procedure: TRANSESOPHAGEAL ECHOCARDIOGRAM (TEE);  Surgeon: Kerin Perna, MD;  Location: [facility] OR;  Service: Open Heart Surgery;  Laterality: N/A;  . TRACHEOSTOMY TUBE PLACEMENT N/A 07/03/2015   Procedure: TRACHEOSTOMY;  Surgeon: Serena Colonel, MD;  Location[facility] OR;  Service: ENT;  Laterality: N/A;  . WISDOM TOOTH EXTRACTION      There were no vitals filed for this visit.      Subjective Assessment - 06/08/16 1456    Subjective I have knee pain.  I did the theraband exercises and felt better.    Pertinent History Heart attack: 05/17/15 with CAGB , In and out of hospital and rehab over the past 215 days due to medial complications.  Recent onset of  Rt shoulder pain   Limitations Walking;Standing   How long can you stand comfortably? 5 minutes with UE support   How long can you walk comfortably? 10 minutes with walker   Patient Stated Goals improve strength and endurance, stand longer    Currently in Pain? Yes   Pain Score 6    Pain Location Knee   Pain Orientation Right   Pain Descriptors / Indicators Sore   Pain Type Acute pain   Pain Onset More than a month ago   Pain Frequency Intermittent   Aggravating Factors  A lot of walking will increase my knee pain   Pain Relieving Factors rest                         [facility] Adult PT Treatment/Exercise - 06/08/16 0001      Ambulation/Gait    Ambulation Distance (Feet) 40 Feet   Assistive device Straight cane   Ambulation Surface Indoor;Level     Knee/Hip Exercises: Aerobic   Nustep L3 x 14.5 min     Knee/Hip Exercises: Seated   Long Arc Quad Strengthening;Left;Right  50 x bil.    Long Arc Quad Weight 4 lbs.   Ball Squeeze 50x   Marching Strengthening;Both;3 sets;15 reps   [employer] 4 lbs.   Abd/Adduction Limitations blue band hip abd 50x     Cryotherapy   Number Minutes Cryotherapy 10 Minutes   Cryotherapy Location Knee   Type of Cryotherapy Ice pack                PT Education - 06/08/16 1518    Education provided No          PT Short Term Goals - 05/23/16 1459      PT SHORT TERM GOAL #3   Title perform TUG in < or = to 21 seconds to reduce risk of falls   Status Achieved     PT SHORT TERM GOAL #4   Title walk for 10 minutes without significant fatigue   Time 4   Period Weeks   Status On-going           PT Long Term Goals - 06/08/16 1521      PT LONG TERM GOAL #1   Title be independent in advanced HEP   Time 6   Period Weeks   Status On-going     PT LONG TERM GOAL #2   Title improve strength and endurance to walk for 10 minutes without significant fatigue at home   Time 6   Period Weeks   Status Revised  5 min     PT LONG TERM GOAL #3   Title perform TUG in < or = to 18 seconds to reduce falls risk   Time 8   Status Achieved     PT LONG TERM GOAL #4   Title improve LE strength to perform sit to stand with minimal UE support   Time 6   Period Weeks   Status On-going     PT LONG TERM GOAL #5   Title wean from walker 50% of the time for home distances   Time 6   Period Weeks   Status On-going     PT LONG TERM GOAL #6   Title ambulate in the clinic with the cane 120 feet consistently without need to rest and progress to SBA   Time 6   Period Weeks   Status On-going  Plan - 06/08/16 1618    Clinical Impression Statement Patient was able  to ambulate 40 feet with single point cane compared to 30 feet.  Patient was able to do more repititions with his exercises due to increased endurance.  Patient looks like he has less fluid in his legs.  Patient can only stand for 5 min.  Patient will benefit from physical therapy to improve strenght and endurance.    Rehab Potential Good   Clinical Impairments Affecting Rehab Potential None   PT Frequency 2x / week   PT Duration 6 weeks   PT Treatment/Interventions ADLs/Self Care Home Management;Cryotherapy;Moist Heat;Therapeutic exercise;Therapeutic activities;Functional mobility training;Stair training;Gait training;Neuromuscular re-education;Patient/family education;Manual techniques;Passive range of motion   PT Next Visit Plan Ambulate with cane, chair exs, weightshifting in standing   PT Home Exercise Plan progress as needed   Consulted and Agree with Plan of Care Patient      Patient will benefit from skilled therapeutic intervention in order to improve the following deficits and impairments:  Abnormal gait, Difficulty walking, Postural dysfunction, Decreased strength, Decreased mobility, Decreased balance, Impaired flexibility, Improper body mechanics, Decreased activity tolerance, Cardiopulmonary status limiting activity, Decreased endurance  Visit Diagnosis: Muscle weakness (generalized)  Difficulty in walking, not elsewhere classified     Problem List Patient Active Problem List   Diagnosis Date Noted  . History of DVT (deep vein thrombosis)   . Long-term (current) use of anticoagulants   . Old anterior myocardial infarction   . Sleep apnea   . Anemia of chronic disease 10/16/2015  . Chronic combined systolic and diastolic congestive heart failure (HCC) 09/25/2015  . Chronic respiratory failure (HCC) 09/17/2015  . 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 Ischemic cardiomyopathy   . CAD (coronary artery disease), native coronary artery   . Morbid obesity (HCC) 05/20/2015  . Benign  prostatic hyperplasia with urinary obstruction 04/24/2015  . Chronic LBP 12/05/2013  . Essential hypertension 09/04/2013    Eulis Foster, PT 06/08/16 4:22 PM   [facility] [address] [place], [place], [address] Phone: [phone]   Fax:  [phone]  Name: Jonathan Le MRN: [id number] Date of Birth: Oct 11, 1946

## 2016-06-13 ENCOUNTER — Ambulatory Visit: Payer: Medicare Other | Admitting: Physical Therapy

## 2016-06-13 ENCOUNTER — Encounter: Payer: Self-pay | Admitting: Physical Therapy

## 2016-06-13 DIAGNOSIS — M6281 Muscle weakness (generalized): Secondary | ICD-10-CM

## 2016-06-13 DIAGNOSIS — R262 Difficulty in walking, not elsewhere classified: Secondary | ICD-10-CM

## 2016-06-13 DIAGNOSIS — M25561 Pain in right knee: Secondary | ICD-10-CM

## 2016-06-13 NOTE — Therapy (Signed)
[facility] [address] [place], [place], [address] Phone: [phone]   Fax:  [phone]  Physical Therapy Treatment  Patient Details  Name: Jonathan Le MRN: [id number] Date of Birth: [birth year] Referring Provider: Elizabeth Palau, NP  Encounter Date: 06/13/2016      PT End of Session - 06/13/16 1520    Visit Number 22   Number of Visits 30   Date for PT Re-Evaluation 07/04/16   Authorization Type Use KX modifier on all visits   PT Start Time 1445   PT Stop Time 1525   PT Time Calculation (min) 40 min   Equipment Utilized During Treatment Gait belt   Activity Tolerance Patient tolerated treatment well   Behavior During Therapy [facility] for tasks assessed/performed      Past Medical History:  Diagnosis Date  . Adult situational stress disorder   . Anemia   . Anxiety   . Arthritis   . CHF (congestive heart failure) (HCC)    MI, with open heart surgery x 30 sdays ago per pt. (06/26/15)  . Chronic low back pain   . Decubital ulcer   . DVT (deep venous thrombosis) (HCC)   . ED (erectile dysfunction)   . ETD (eustachian tube dysfunction)   . Ganglion cyst   . GERD (gastroesophageal reflux disease)   . Glaucoma   . Hypertension   . Ileostomy in place [facility])   . MI (myocardial infarction) (HCC)   . Osteoarthritis   . S/P CABG (coronary artery bypass graft)   . Sleep apnea    wears CPAP nightly    Past Surgical History:  Procedure Laterality Date  . CARDIAC CATHETERIZATION N/A 05/20/2015   Procedure: Left Heart Cath and Coronary Angiography;  Surgeon: Kathleene Hazel, MD;  Location: [facility] INVASIVE CV LAB;  Service: Cardiovascular;  Laterality: N/A;  . CORONARY ARTERY BYPASS GRAFT N/A 05/22/2015   Procedure: CORONARY ARTERY BYPASS GRAFTING (CABG), ON PUMP, TIMES THREE, USING LEFT INTERNAL MAMMARY ARTERY, RIGHT GREATER SAPHENOUS VEIN HARVESTED ENDOSCOPICALLY;  Surgeon: Kerin Perna, MD;  Location: [facility]  OR;  Service: Open Heart Surgery;  Laterality: N/A;  LIMA-LAD, SVG-OM, SVG-PD  . EYE SURGERY     bil cataract  . FASCIOTOMY FOOT / TOE Left    foot  . HERNIA REPAIR     right  . LAPAROTOMY N/A 06/18/2015   Procedure: EXPLORATORY LAPAROTOMY;  Surgeon: Emelia Loron, MD;  Location: [facility] OR;  Service: General;  Laterality: N/A;  . LAPAROTOMY N/A 06/20/2015   Procedure: WASHOUT OF ABDOMEN, CLOSURE OF ABDOMINAL WALL WITH PLACEMENT OF WOUND VAC AND ILLEOSTOMY ;  Surgeon: Almond Lint, MD;  Location: [facility] OR;  Service: General;  Laterality: N/A;  . TEE WITHOUT CARDIOVERSION N/A 05/22/2015   Procedure: TRANSESOPHAGEAL ECHOCARDIOGRAM (TEE);  Surgeon: Kerin Perna, MD;  Location: [facility] OR;  Service: Open Heart Surgery;  Laterality: N/A;  . TRACHEOSTOMY TUBE PLACEMENT N/A 07/03/2015   Procedure: TRACHEOSTOMY;  Surgeon: Serena Colonel, MD;  Location: [facility] OR;  Service: ENT;  Laterality: N/A;  . WISDOM TOOTH EXTRACTION      There were no vitals filed for this visit.      Subjective Assessment - 06/13/16 1455    Subjective My knee hurts.  I am stiff today.  My legs are not as swollen.    Pertinent History Heart attack: 05/17/15 with CAGB , In and out of hospital and rehab over the past 215 days due to medial complications.  Recent  onset of Rt shoulder pain   Limitations Walking;Standing   How long can you stand comfortably? 5 minutes with UE support   How long can you walk comfortably? 10 minutes with walker   Patient Stated Goals improve strength and endurance, stand longer    Currently in Pain? Yes   Pain Score 6    Pain Location Knee   Pain Orientation Right   Pain Descriptors / Indicators Sore   Pain Type Chronic pain   Pain Onset More than a month ago   Pain Frequency Intermittent   Aggravating Factors  alot of walking will increase my knee pain   Pain Relieving Factors rest   Multiple Pain Sites No                         [facility] Adult PT Treatment/Exercise - 06/13/16 0001       Ambulation/Gait   Ambulation Distance (Feet) 90 Feet   Assistive device Straight cane   Ambulation Surface Indoor;Level  rolling walker behind him     Knee/Hip Exercises: Aerobic   Nustep L3 x 14. min     Knee/Hip Exercises: Seated   Long Arc Quad Weight 4 lbs.   Long [employer] Limitations bil.    Ball Squeeze 50x   Marching Strengthening;Both;3 sets;15 reps   [employer] 4 lbs.     Cryotherapy   Number Minutes Cryotherapy 10 Minutes   Cryotherapy Location Knee   Type of Cryotherapy Ice pack                PT Education - 06/13/16 1519    Education provided No          PT Short Term Goals - 05/23/16 1459      PT SHORT TERM GOAL #3   Title perform TUG in < or = to 21 seconds to reduce risk of falls   Status Achieved     PT SHORT TERM GOAL #4   Title walk for 10 minutes without significant fatigue   Time 4   Period Weeks   Status On-going           PT Long Term Goals - 06/13/16 1530      PT LONG TERM GOAL #1   Title be independent in advanced HEP   Time 6   Period Weeks   Status On-going  still learining     PT LONG TERM GOAL #2   Title improve strength and endurance to walk for 10 minutes without significant fatigue at home   Time 6   Period Weeks   Status Revised     PT LONG TERM GOAL #3   Title perform TUG in < or = to 18 seconds to reduce falls risk   Time 8   Period Weeks   Status Achieved     PT LONG TERM GOAL #4   Title improve LE strength to perform sit to stand with minimal UE support   Time 6   Period Weeks   Status On-going     PT LONG TERM GOAL #5   Title wean from walker 50% of the time for home distances   Time 6   Period Weeks   Status On-going  working on walking with [facility]     PT LONG TERM GOAL #6   Title ambulate in the clinic with the cane 120 feet consistently without need to rest and progress to SBA   Time 6  Period Weeks   Status On-going  90 feet               Plan - 06/13/16 1528     Clinical Impression Statement Patient did not meet goals today due to flare-up of pain in right knee.  Patient was able to ambulate with [facility] for 90 feet, he did not think he could do it.  Patient was not as stiff after therapy. Patient will benefit from skilled therapy to increase strength, balance and endurance.    Rehab Potential Good   Clinical Impairments Affecting Rehab Potential None   PT Frequency 2x / week   PT Duration 6 weeks   PT Treatment/Interventions ADLs/Self Care Home Management;Cryotherapy;Moist Heat;Therapeutic exercise;Therapeutic activities;Functional mobility training;Stair training;Gait training;Neuromuscular re-education;Patient/family education;Manual techniques;Passive range of motion   PT Next Visit Plan Ambulate with cane, chair exs, weightshifting in standing   PT Home Exercise Plan progress as needed   Consulted and Agree with Plan of Care Patient      Patient will benefit from skilled therapeutic intervention in order to improve the following deficits and impairments:  Abnormal gait, Difficulty walking, Postural dysfunction, Decreased strength, Decreased mobility, Decreased balance, Impaired flexibility, Improper body mechanics, Decreased activity tolerance, Cardiopulmonary status limiting activity, Decreased endurance  Visit Diagnosis: Muscle weakness (generalized)  Difficulty in walking, not elsewhere classified  Right knee pain     Problem List Patient Active Problem List   Diagnosis Date Noted  . History of DVT (deep vein thrombosis)   . Long-term (current) use of anticoagulants   . Old anterior myocardial infarction   . Sleep apnea   . Anemia of chronic disease 10/16/2015  . Chronic combined systolic and diastolic congestive heart failure (HCC) 09/25/2015  . Chronic respiratory failure (HCC) 09/17/2015  . 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 Ischemic cardiomyopathy   . CAD (coronary artery disease), native coronary artery   . Morbid obesity (HCC)  05/20/2015  . Benign prostatic hyperplasia with urinary obstruction 04/24/2015  . Chronic LBP 12/05/2013  . Essential hypertension 09/04/2013    Eulis Foster, PT 06/13/16 3:32 PM   [facility] [address], [address] [place], [place], [address] Phone: [phone]   Fax:  [phone]  Name: Jonathan Le MRN: [id number] Date of Birth: [birth year]

## 2016-06-15 ENCOUNTER — Ambulatory Visit: Payer: Medicare Other | Attending: Nurse Practitioner | Admitting: Physical Therapy

## 2016-06-15 ENCOUNTER — Encounter: Payer: Self-pay | Admitting: Physical Therapy

## 2016-06-15 DIAGNOSIS — R262 Difficulty in walking, not elsewhere classified: Secondary | ICD-10-CM | POA: Diagnosis present

## 2016-06-15 DIAGNOSIS — M6281 Muscle weakness (generalized): Secondary | ICD-10-CM | POA: Diagnosis present

## 2016-06-15 DIAGNOSIS — L0291 Cutaneous abscess, unspecified: Secondary | ICD-10-CM | POA: Diagnosis present

## 2016-06-15 DIAGNOSIS — M25561 Pain in right knee: Secondary | ICD-10-CM | POA: Diagnosis present

## 2016-06-15 DIAGNOSIS — K651 Peritoneal abscess: Secondary | ICD-10-CM | POA: Insufficient documentation

## 2016-06-15 NOTE — Therapy (Signed)
[facility] [address], [address] [place], [place], [address] Phone: [phone]   Fax:  [phone]  Physical Therapy Treatment  Patient Details  Name: Jonathan Le MRN: [id number] Date of Birth: 11-22-1945 Referring Provider: Elizabeth Palau, NP  Encounter Date: 06/15/2016      PT End of Session - 06/15/16 1447    Visit Number 23   Number of Visits 30   Date for PT Re-Evaluation 07/04/16   Authorization Type Use KX modifier on all visits   PT Start Time 1446   PT Stop Time 1540   PT Time Calculation (min) 54 min   Activity Tolerance Patient tolerated treatment well      Past Medical History:  Diagnosis Date  . Adult situational stress disorder   . Anemia   . Anxiety   . Arthritis   . CHF (congestive heart failure) (HCC)    MI, with open heart surgery x 30 sdays ago per pt. (06/26/15)  . Chronic low back pain   . Decubital ulcer   . DVT (deep venous thrombosis) (HCC)   . ED (erectile dysfunction)   . ETD (eustachian tube dysfunction)   . Ganglion cyst   . GERD (gastroesophageal reflux disease)   . Glaucoma   . Hypertension   . Ileostomy in place [facility])   . MI (myocardial infarction) (HCC)   . Osteoarthritis   . S/P CABG (coronary artery bypass graft)   . Sleep apnea    wears CPAP nightly    Past Surgical History:  Procedure Laterality Date  . CARDIAC CATHETERIZATION N/A 05/20/2015   Procedure: Left Heart Cath and Coronary Angiography;  Surgeon: Kathleene Hazel, MD;  Location: [facility] INVASIVE CV LAB;  Service: Cardiovascular;  Laterality: N/A;  . CORONARY ARTERY BYPASS GRAFT N/A 05/22/2015   Procedure: CORONARY ARTERY BYPASS GRAFTING (CABG), ON PUMP, TIMES THREE, USING LEFT INTERNAL MAMMARY ARTERY, RIGHT GREATER SAPHENOUS VEIN HARVESTED ENDOSCOPICALLY;  Surgeon: Kerin Perna, MD;  Location: [facility] OR;  Service: Open Heart Surgery;  Laterality: N/A;  LIMA-LAD, SVG-OM, SVG-PD  . EYE SURGERY     bil cataract   . FASCIOTOMY FOOT / TOE Left    foot  . HERNIA REPAIR     right  . LAPAROTOMY N/A 06/18/2015   Procedure: EXPLORATORY LAPAROTOMY;  Surgeon: Emelia Loron, MD;  Location: [facility] OR;  Service: General;  Laterality: N/A;  . LAPAROTOMY N/A 06/20/2015   Procedure: WASHOUT OF ABDOMEN, CLOSURE OF ABDOMINAL WALL WITH PLACEMENT OF WOUND VAC AND ILLEOSTOMY ;  Surgeon: Almond Lint, MD;  Location: [facility] OR;  Service: General;  Laterality: N/A;  . TEE WITHOUT CARDIOVERSION N/A 05/22/2015   Procedure: TRANSESOPHAGEAL ECHOCARDIOGRAM (TEE);  Surgeon: Kerin Perna, MD;  Location: [facility] OR;  Service: Open Heart Surgery;  Laterality: N/A;  . TRACHEOSTOMY TUBE PLACEMENT N/A 07/03/2015   Procedure: TRACHEOSTOMY;  Surgeon: Serena Colonel, MD;  Location: [facility] OR;  Service: ENT;  Laterality: N/A;  . WISDOM TOOTH EXTRACTION      There were no vitals filed for this visit.      Subjective Assessment - 06/15/16 1447    Subjective Doing about the same today.    Pain Score 6    Pain Location Knee   Pain Orientation Right   Multiple Pain Sites No                         [facility] Adult PT Treatment/Exercise - 06/15/16 0001  Ambulation/Gait   Ambulation Distance (Feet) 90 Feet   Assistive device Straight cane   Ambulation Surface Level;Indoor  SBA,      Knee/Hip Exercises: Aerobic   Nustep L3 x 14 min     Knee/Hip Exercises: Standing   Heel Raises --  weight shifting fwd/bkwrd 10x at bar   Hip Abduction AROM;Stengthening;Right;Both;1 set;10 reps     Knee/Hip Exercises: Seated   Ball Squeeze 50x   Abd/Adduction Limitations blue band hip abd 50x     Cryotherapy   Number Minutes Cryotherapy 10 Minutes   Cryotherapy Location Knee   Type of Cryotherapy Ice pack                  PT Short Term Goals - 05/23/16 1459      PT SHORT TERM GOAL #3   Title perform TUG in < or = to 21 seconds to reduce risk of falls   Status Achieved     PT SHORT TERM GOAL #4   Title walk for 10 minutes  without significant fatigue   Time 4   Period Weeks   Status On-going           PT Long Term Goals - 06/13/16 1530      PT LONG TERM GOAL #1   Title be independent in advanced HEP   Time 6   Period Weeks   Status On-going  still learining     PT LONG TERM GOAL #2   Title improve strength and endurance to walk for 10 minutes without significant fatigue at home   Time 6   Period Weeks   Status Revised     PT LONG TERM GOAL #3   Title perform TUG in < or = to 18 seconds to reduce falls risk   Time 8   Period Weeks   Status Achieved     PT LONG TERM GOAL #4   Title improve LE strength to perform sit to stand with minimal UE support   Time 6   Period Weeks   Status On-going     PT LONG TERM GOAL #5   Title wean from walker 50% of the time for home distances   Time 6   Period Weeks   Status On-going  working on walking with [facility]     PT LONG TERM GOAL #6   Title ambulate in the clinic with the cane 120 feet consistently without need to rest and progress to SBA   Time 6   Period Weeks   Status On-going  90 feet               Plan - 06/15/16 1448    Clinical Impression Statement Knee pain persists: upped walking distance to 94 feet. Pt reports he was able to do quite a bit of walking yesterday.    Rehab Potential Good   Clinical Impairments Affecting Rehab Potential None   PT Frequency 2x / week   PT Duration 6 weeks   PT Treatment/Interventions ADLs/Self Care Home Management;Cryotherapy;Moist Heat;Therapeutic exercise;Therapeutic activities;Functional mobility training;Stair training;Gait training;Neuromuscular re-education;Patient/family education;Manual techniques;Passive range of motion      Patient will benefit from skilled therapeutic intervention in order to improve the following deficits and impairments:  Abnormal gait, Difficulty walking, Postural dysfunction, Decreased strength, Decreased mobility, Decreased balance, Impaired flexibility, Improper  body mechanics, Decreased activity tolerance, Cardiopulmonary status limiting activity, Decreased endurance  Visit Diagnosis: Muscle weakness (generalized)  Difficulty in walking, not elsewhere classified  Right knee pain  Problem List Patient Active Problem List   Diagnosis Date Noted  . History of DVT (deep vein thrombosis)   . Long-term (current) use of anticoagulants   . Old anterior myocardial infarction   . Sleep apnea   . Anemia of chronic disease 10/16/2015  . Chronic combined systolic and diastolic congestive heart failure (HCC) 09/25/2015  . Chronic respiratory failure (HCC) 09/17/2015  . 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 Ischemic cardiomyopathy   . CAD (coronary artery disease), native coronary artery   . Morbid obesity (HCC) 05/20/2015  . Benign prostatic hyperplasia with urinary obstruction 04/24/2015  . Chronic LBP 12/05/2013  . Essential hypertension 09/04/2013    Jonathan Le, PTA 06/15/2016, 3:24 PM  [facility] [address], [address] [place], [place], [address] Phone: [phone]   Fax:  [phone]  Name: Jonathan Le MRN: [id number] Date of Birth: January 02, 1946

## 2016-06-20 ENCOUNTER — Encounter: Payer: Medicare Other | Admitting: Physical Therapy

## 2016-06-20 ENCOUNTER — Ambulatory Visit: Payer: Medicare Other | Admitting: Physical Therapy

## 2016-06-20 ENCOUNTER — Encounter: Payer: Self-pay | Admitting: Physical Therapy

## 2016-06-20 DIAGNOSIS — R262 Difficulty in walking, not elsewhere classified: Secondary | ICD-10-CM

## 2016-06-20 DIAGNOSIS — M6281 Muscle weakness (generalized): Secondary | ICD-10-CM

## 2016-06-20 DIAGNOSIS — M25561 Pain in right knee: Secondary | ICD-10-CM

## 2016-06-20 NOTE — Therapy (Signed)
[facility] [address], [address] [place], [place], [address] Phone: [phone]   Fax:  [phone]  Physical Therapy Treatment  Patient Details  Name: Jonathan Le MRN: [id number] Date of Birth: Dec 28, 1945 Referring Provider: Elizabeth Palau, NP  Encounter Date: 06/20/2016      PT End of Session - 06/20/16 1450    Visit Number 24   Number of Visits 30   Date for PT Re-Evaluation 07/04/16   Authorization Type Use KX modifier on all visits   PT Start Time 1445   PT Stop Time 1540   PT Time Calculation (min) 55 min   Activity Tolerance Patient tolerated treatment well   Behavior During Therapy [facility] for tasks assessed/performed      Past Medical History:  Diagnosis Date  . Adult situational stress disorder   . Anemia   . Anxiety   . Arthritis   . CHF (congestive heart failure) (HCC)    MI, with open heart surgery x 30 sdays ago per pt. (06/26/15)  . Chronic low back pain   . Decubital ulcer   . DVT (deep venous thrombosis) (HCC)   . ED (erectile dysfunction)   . ETD (eustachian tube dysfunction)   . Ganglion cyst   . GERD (gastroesophageal reflux disease)   . Glaucoma   . Hypertension   . Ileostomy in place [facility])   . MI (myocardial infarction) (HCC)   . Osteoarthritis   . S/P CABG (coronary artery bypass graft)   . Sleep apnea    wears CPAP nightly    Past Surgical History:  Procedure Laterality Date  . CARDIAC CATHETERIZATION N/A 05/20/2015   Procedure: Left Heart Cath and Coronary Angiography;  Surgeon: Kathleene Hazel, MD;  Location: [facility] INVASIVE CV LAB;  Service: Cardiovascular;  Laterality: N/A;  . CORONARY ARTERY BYPASS GRAFT N/A 05/22/2015   Procedure: CORONARY ARTERY BYPASS GRAFTING (CABG), ON PUMP, TIMES THREE, USING LEFT INTERNAL MAMMARY ARTERY, RIGHT GREATER SAPHENOUS VEIN HARVESTED ENDOSCOPICALLY;  Surgeon: Kerin Perna, MD;  Location: [facility] OR;  Service: Open Heart Surgery;  Laterality: N/A;   LIMA-LAD, SVG-OM, SVG-PD  . EYE SURGERY     bil cataract  . FASCIOTOMY FOOT / TOE Left    foot  . HERNIA REPAIR     right  . LAPAROTOMY N/A 06/18/2015   Procedure: EXPLORATORY LAPAROTOMY;  Surgeon: Emelia Loron, MD;  Location: [facility] OR;  Service: General;  Laterality: N/A;  . LAPAROTOMY N/A 06/20/2015   Procedure: WASHOUT OF ABDOMEN, CLOSURE OF ABDOMINAL WALL WITH PLACEMENT OF WOUND VAC AND ILLEOSTOMY ;  Surgeon: Almond Lint, MD;  Location: [facility] OR;  Service: General;  Laterality: N/A;  . TEE WITHOUT CARDIOVERSION N/A 05/22/2015   Procedure: TRANSESOPHAGEAL ECHOCARDIOGRAM (TEE);  Surgeon: Kerin Perna, MD;  Location: [facility] OR;  Service: Open Heart Surgery;  Laterality: N/A;  . TRACHEOSTOMY TUBE PLACEMENT N/A 07/03/2015   Procedure: TRACHEOSTOMY;  Surgeon: Serena Colonel, MD;  Location: [facility] OR;  Service: ENT;  Laterality: N/A;  . WISDOM TOOTH EXTRACTION      There were no vitals filed for this visit.      Subjective Assessment - 06/20/16 1449    Subjective Pt's shoulder and knee are bugging him a little higher than usual, thinks it is the rainly weather.                          [facility] Adult PT Treatment/Exercise - 06/20/16 0001  Ambulation/Gait   Ambulation Distance (Feet) 80 Feet   Assistive device Rolling walker   Gait Pattern --  Faster speed and quicker foot turn over   Ambulation Surface Level;Indoor  SBA     Knee/Hip Exercises: Standing   Hip Abduction AROM;Stengthening;Right;Both;1 set;10 reps     Knee/Hip Exercises: Seated   Long Arc Quad Strengthening;Both;5 sets;10 reps   Long Arc Quad Weight 4 lbs.   Ball Squeeze 80x   Marching Strengthening;Both;3 sets;15 reps   [employer] 4 lbs.   Abd/Adduction Limitations blue band hip abd 50x     Cryotherapy   Number Minutes Cryotherapy 10 Minutes   Cryotherapy Location Knee   Type of Cryotherapy Ice pack                  PT Short Term Goals - 05/23/16 1459      PT SHORT TERM GOAL #3    Title perform TUG in < or = to 21 seconds to reduce risk of falls   Status Achieved     PT SHORT TERM GOAL #4   Title walk for 10 minutes without significant fatigue   Time 4   Period Weeks   Status On-going           PT Long Term Goals - 06/13/16 1530      PT LONG TERM GOAL #1   Title be independent in advanced HEP   Time 6   Period Weeks   Status On-going  still learining     PT LONG TERM GOAL #2   Title improve strength and endurance to walk for 10 minutes without significant fatigue at home   Time 6   Period Weeks   Status Revised     PT LONG TERM GOAL #3   Title perform TUG in < or = to 18 seconds to reduce falls risk   Time 8   Period Weeks   Status Achieved     PT LONG TERM GOAL #4   Title improve LE strength to perform sit to stand with minimal UE support   Time 6   Period Weeks   Status On-going     PT LONG TERM GOAL #5   Title wean from walker 50% of the time for home distances   Time 6   Period Weeks   Status On-going  [facility]     PT LONG TERM GOAL #6   Title ambulate in the clinic with the cane 120 feet consistently without need to rest and progress to SBA   Time 6   Period Weeks   Status On-going  90 feet               Plan - 06/20/16 1450    Clinical Impression Statement Rainly weather increasing knee and shoulder pain potentially today. Was able to walk a a good pace with erect posture using his walker. He goes back to step to step using cane with much slower speed.    Rehab Potential Good   Clinical Impairments Affecting Rehab Potential None   PT Frequency 2x / week   PT Duration 6 weeks   PT Treatment/Interventions ADLs/Self Care Home Management;Cryotherapy;Moist Heat;Therapeutic exercise;Therapeutic activities;Functional mobility training;Stair training;Gait training;Neuromuscular re-education;Patient/family education;Manual techniques;Passive range of motion   PT Next Visit Plan Ambulate with cane, chair  exs, weightshifting in standing   Consulted and Agree with Plan of Care Patient      Patient will benefit from skilled therapeutic intervention in  order to improve the following deficits and impairments:  Abnormal gait, Difficulty walking, Postural dysfunction, Decreased strength, Decreased mobility, Decreased balance, Impaired flexibility, Improper body mechanics, Decreased activity tolerance, Cardiopulmonary status limiting activity, Decreased endurance  Visit Diagnosis: Muscle weakness (generalized)  Difficulty in walking, not elsewhere classified  Right knee pain     Problem List Patient Active Problem List   Diagnosis Date Noted  . History of DVT (deep vein thrombosis)   . Long-term (current) use of anticoagulants   . Old anterior myocardial infarction   . Sleep apnea   . Anemia of chronic disease 10/16/2015  . Chronic combined systolic and diastolic congestive heart failure (HCC) 09/25/2015  . Chronic respiratory failure (HCC) 09/17/2015  . 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 Ischemic cardiomyopathy   . CAD (coronary artery disease), native coronary artery   . Morbid obesity (HCC) 05/20/2015  . Benign prostatic hyperplasia with urinary obstruction 04/24/2015  . Chronic LBP 12/05/2013  . Essential hypertension 09/04/2013    Eudelia Hiltunen,PTA 06/20/2016, 3:33 PM  [facility] [address] [place], [place], [address] Phone: [phone]   Fax:  [phone]  Name: GEARLD KERSTEIN MRN: [id number] Date of Birth: 03-03-1946

## 2016-06-22 ENCOUNTER — Encounter: Payer: Medicare Other | Admitting: Physical Therapy

## 2016-06-23 ENCOUNTER — Ambulatory Visit: Payer: Medicare Other

## 2016-06-23 DIAGNOSIS — M6281 Muscle weakness (generalized): Secondary | ICD-10-CM

## 2016-06-23 DIAGNOSIS — M25561 Pain in right knee: Secondary | ICD-10-CM

## 2016-06-23 DIAGNOSIS — R262 Difficulty in walking, not elsewhere classified: Secondary | ICD-10-CM

## 2016-06-23 NOTE — Therapy (Signed)
[facility] [address], [address] [place], [place], [address] Phone: [phone]   Fax:  [phone]  Physical Therapy Treatment  Patient Details  Name: Jonathan Le MRN: [id number] Date of Birth: 16-Dec-1945 Referring Provider: Elizabeth Palau, NP  Encounter Date: 06/23/2016      PT End of Session - 06/23/16 1148    Visit Number 25   Number of Visits 30   Date for PT Re-Evaluation 07/04/16   Authorization Type Use KX modifier on all visits   PT Start Time 1100   PT Stop Time 1148   PT Time Calculation (min) 48 min   Activity Tolerance Patient tolerated treatment well   Behavior During Therapy [facility] for tasks assessed/performed      Past Medical History:  Diagnosis Date  . Adult situational stress disorder   . Anemia   . Anxiety   . Arthritis   . CHF (congestive heart failure) (HCC)    MI, with open heart surgery x 30 sdays ago per pt. (06/26/15)  . Chronic low back pain   . Decubital ulcer   . DVT (deep venous thrombosis) (HCC)   . ED (erectile dysfunction)   . ETD (eustachian tube dysfunction)   . Ganglion cyst   . GERD (gastroesophageal reflux disease)   . Glaucoma   . Hypertension   . Ileostomy in place [facility])   . MI (myocardial infarction) (HCC)   . Osteoarthritis   . S/P CABG (coronary artery bypass graft)   . Sleep apnea    wears CPAP nightly    Past Surgical History:  Procedure Laterality Date  . CARDIAC CATHETERIZATION N/A 05/20/2015   Procedure: Left Heart Cath and Coronary Angiography;  Surgeon: Kathleene Hazel, MD;  Location: [facility] INVASIVE CV LAB;  Service: Cardiovascular;  Laterality: N/A;  . CORONARY ARTERY BYPASS GRAFT N/A 05/22/2015   Procedure: CORONARY ARTERY BYPASS GRAFTING (CABG), ON PUMP, TIMES THREE, USING LEFT INTERNAL MAMMARY ARTERY, RIGHT GREATER SAPHENOUS VEIN HARVESTED ENDOSCOPICALLY;  Surgeon: Kerin Perna, MD;  Location: [facility] OR;  Service: Open Heart Surgery;  Laterality: N/A;   LIMA-LAD, SVG-OM, SVG-PD  . EYE SURGERY     bil cataract  . FASCIOTOMY FOOT / TOE Left    foot  . HERNIA REPAIR     right  . LAPAROTOMY N/A 06/18/2015   Procedure: EXPLORATORY LAPAROTOMY;  Surgeon: Emelia Loron, MD;  Location: [facility] OR;  Service: General;  Laterality: N/A;  . LAPAROTOMY N/A 06/20/2015   Procedure: WASHOUT OF ABDOMEN, CLOSURE OF ABDOMINAL WALL WITH PLACEMENT OF WOUND VAC AND ILLEOSTOMY ;  Surgeon: Almond Lint, MD;  Location: [facility] OR;  Service: General;  Laterality: N/A;  . TEE WITHOUT CARDIOVERSION N/A 05/22/2015   Procedure: TRANSESOPHAGEAL ECHOCARDIOGRAM (TEE);  Surgeon: Kerin Perna, MD;  [facility];  Service: Open Heart Surgery;  Laterality: N/A;  . TRACHEOSTOMY TUBE PLACEMENT N/A 07/03/2015   Procedure: TRACHEOSTOMY;  Surgeon: Serena Colonel, MD;  Location: [facility] OR;  Service: ENT;  Laterality: N/A;  . WISDOM TOOTH EXTRACTION      There were no vitals filed for this visit.      Subjective Assessment - 06/23/16 1110    Subjective Feeling stiff today.     Patient Stated Goals improve strength and endurance, stand longer    Currently in Pain? Yes   Pain Score 6    Pain Location Knee   Pain Orientation Right   Pain Descriptors / Indicators Sore   Pain Type Chronic pain  Pain Onset More than a month ago   Pain Frequency Intermittent                         [facility] Adult PT Treatment/Exercise - 06/23/16 0001      Ambulation/Gait   Ambulation Distance (Feet) 100 Feet   Assistive device Rolling walker   Ambulation Surface Level;Indoor     Knee/Hip Exercises: Aerobic   Nustep L3 x 14 min     Knee/Hip Exercises: Standing   Hip Abduction AROM;Stengthening;Right;Both;1 set;10 reps     Knee/Hip Exercises: Seated   Long Arc Quad Strengthening;Both;5 sets;10 reps   Long Arc Quad Weight 4 lbs.   Ball Squeeze 80x   Marching Strengthening;Both;3 sets;15 reps   [employer] 4 lbs.   Abd/Adduction Limitations blue band hip abd 50x      Cryotherapy   Number Minutes Cryotherapy --  declined ice due to time contraints   Cryotherapy Location --   Type of Cryotherapy --                  PT Short Term Goals - 06/23/16 1118      PT SHORT TERM GOAL #4   Title walk for 10 minutes without significant fatigue   Time 4   Period Weeks   Status On-going           PT Long Term Goals - 06/23/16 1119      PT LONG TERM GOAL #2   Title improve strength and endurance to walk for 10 minutes without significant fatigue at home   Time 6   Period Weeks   Status On-going     PT LONG TERM GOAL #4   Title improve LE strength to perform sit to stand with minimal UE support   Time 6   Period Weeks   Status On-going     PT LONG TERM GOAL #6   Title ambulate in the clinic with the cane 120 feet consistently without need to rest and progress to SBA   Time 6   Period Weeks   Status On-going               Plan - 06/23/16 1106    Clinical Impression Statement Pt with continued endurance and strength deficits of a chronic nature.  Pt was able to walk at a good pace with erect posture.  Pt with limited ability to stand due to knee pain.  Pt will continue to benefit from skilled PT for strength, endurance and gait progression.     Rehab Potential Good   PT Frequency 2x / week   PT Duration 6 weeks   PT Treatment/Interventions ADLs/Self Care Home Management;Cryotherapy;Moist Heat;Therapeutic exercise;Therapeutic activities;Functional mobility training;Stair training;Gait training;Neuromuscular re-education;Patient/family education;Manual techniques;Passive range of motion   PT Next Visit Plan Ambulate with cane, chair exs, weightshifting in standing   Consulted and Agree with Plan of Care Patient      Patient will benefit from skilled therapeutic intervention in order to improve the following deficits and impairments:  Abnormal gait, Difficulty walking, Postural dysfunction, Decreased strength, Decreased mobility,  Decreased balance, Impaired flexibility, Improper body mechanics, Decreased activity tolerance, Cardiopulmonary status limiting activity, Decreased endurance  Visit Diagnosis: Muscle weakness (generalized)  Difficulty in walking, not elsewhere classified  Right knee pain     Problem List Patient Active Problem List   Diagnosis Date Noted  . History of DVT (deep vein thrombosis)   . Long-term (current) use of anticoagulants   .  Old anterior myocardial infarction   . Sleep apnea   . Anemia of chronic disease 10/16/2015  . Chronic combined systolic and diastolic congestive heart failure (HCC) 09/25/2015  . Chronic respiratory failure (HCC) 09/17/2015  . 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 Ischemic cardiomyopathy   . CAD (coronary artery disease), native coronary artery   . Morbid obesity (HCC) 05/20/2015  . Benign prostatic hyperplasia with urinary obstruction 04/24/2015  . Chronic LBP 12/05/2013  . Essential hypertension 09/04/2013     Lorrene ReidKelly Takacs, PT 06/23/16 11:49 AM  [facility] [address], [address] [place], [place], [address] Phone: [phone]   Fax:  [phone]  Name: Ardyth HarpsHarold D Bergey MRN: [id number] Date of Birth: 09/06/1946

## 2016-06-27 ENCOUNTER — Ambulatory Visit: Payer: Medicare Other | Admitting: Physical Therapy

## 2016-06-27 ENCOUNTER — Encounter: Payer: Self-pay | Admitting: Physical Therapy

## 2016-06-27 DIAGNOSIS — R262 Difficulty in walking, not elsewhere classified: Secondary | ICD-10-CM

## 2016-06-27 DIAGNOSIS — M25561 Pain in right knee: Secondary | ICD-10-CM

## 2016-06-27 DIAGNOSIS — M6281 Muscle weakness (generalized): Secondary | ICD-10-CM | POA: Diagnosis not present

## 2016-06-27 NOTE — Therapy (Signed)
[facility] [address], [address] [place], [place], [address] Phone: [phone]   Fax:  [phone]  Physical Therapy Treatment  Patient Details  Name: Jonathan Le MRN: [id number] Date of Birth: 10-20-1946 Referring Provider: Elizabeth Palau, NP  Encounter Date: 06/27/2016      PT End of Session - 06/27/16 1453    Visit Number 26   Number of Visits 30   Date for PT Re-Evaluation 07/04/16   Authorization Type Use KX modifier on all visits   PT Start Time 1450   PT Stop Time 1550   PT Time Calculation (min) 60 min   Activity Tolerance Patient tolerated treatment well   Behavior During Therapy [facility] for tasks assessed/performed      Past Medical History:  Diagnosis Date  . Adult situational stress disorder   . Anemia   . Anxiety   . Arthritis   . CHF (congestive heart failure) (HCC)    MI, with open heart surgery x 30 sdays ago per pt. (06/26/15)  . Chronic low back pain   . Decubital ulcer   . DVT (deep venous thrombosis) (HCC)   . ED (erectile dysfunction)   . ETD (eustachian tube dysfunction)   . Ganglion cyst   . GERD (gastroesophageal reflux disease)   . Glaucoma   . Hypertension   . Ileostomy in place [facility])   . MI (myocardial infarction) (HCC)   . Osteoarthritis   . S/P CABG (coronary artery bypass graft)   . Sleep apnea    wears CPAP nightly    Past Surgical History:  Procedure Laterality Date  . CARDIAC CATHETERIZATION N/A 05/20/2015   Procedure: Left Heart Cath and Coronary Angiography;  Surgeon: Kathleene Hazel, MD;  Location: [facility] INVASIVE CV LAB;  Service: Cardiovascular;  Laterality: N/A;  . CORONARY ARTERY BYPASS GRAFT N/A 05/22/2015   Procedure: CORONARY ARTERY BYPASS GRAFTING (CABG), ON PUMP, TIMES THREE, USING LEFT INTERNAL MAMMARY ARTERY, RIGHT GREATER SAPHENOUS VEIN HARVESTED ENDOSCOPICALLY;  Surgeon: Kerin Perna, MD;  Location: [facility] OR;  Service: Open Heart Surgery;  Laterality: N/A;   LIMA-LAD, SVG-OM, SVG-PD  . EYE SURGERY     bil cataract  . FASCIOTOMY FOOT / TOE Left    foot  . HERNIA REPAIR     right  . LAPAROTOMY N/A 06/18/2015   Procedure: EXPLORATORY LAPAROTOMY;  Surgeon: Emelia Loron, MD;  Location: [facility] OR;  Service: General;  Laterality: N/A;  . LAPAROTOMY N/A 06/20/2015   Procedure: WASHOUT OF ABDOMEN, CLOSURE OF ABDOMINAL WALL WITH PLACEMENT OF WOUND VAC AND ILLEOSTOMY ;  Surgeon: Almond Lint, MD;  Location: [facility] OR;  Service: General;  Laterality: N/A;  . TEE WITHOUT CARDIOVERSION N/A 05/22/2015   Procedure: TRANSESOPHAGEAL ECHOCARDIOGRAM (TEE);  Surgeon: Kerin Perna, MD;  Location: [facility] OR;  Service: Open Heart Surgery;  Laterality: N/A;  . TRACHEOSTOMY TUBE PLACEMENT N/A 07/03/2015   Procedure: TRACHEOSTOMY;  Surgeon: Serena Colonel, MD;  Location: [facility] OR;  Service: ENT;  Laterality: N/A;  . WISDOM TOOTH EXTRACTION      There were no vitals filed for this visit.      Subjective Assessment - 06/27/16 1452    Subjective No new complaints.   Currently in Pain? Yes   Pain Score 5    Pain Location Knee   Pain Orientation Right   Pain Descriptors / Indicators Sore   Aggravating Factors  Walking   Pain Relieving Factors NWB   Multiple Pain Sites No  [facility] Adult PT Treatment/Exercise - 06/27/16 0001      Ambulation/Gait   Ambulation Distance (Feet) --  26 feet, 27 feet, 27 feet SBA, step to pattern   Ambulation Surface Level;Indoor     Knee/Hip Exercises: Aerobic   Nustep L3 x 14 min     Knee/Hip Exercises: Standing   Heel Raises --  Weight shifts Ant/Post 20x   Hip Abduction AROM;Stengthening;Right;Both;2 sets;10 reps     Knee/Hip Exercises: Seated   Long Arc Quad Strengthening;Both;5 sets;10 reps   Long Arc Quad Weight 4 lbs.   Ball Squeeze 80x   Marching Strengthening;Both;3 sets;15 reps   [employer] Weights 4 lbs.   Abd/Adduction Limitations blue band hip abd 50x     Cryotherapy   Number Minutes  Cryotherapy 10 Minutes   Cryotherapy Location Knee   Type of Cryotherapy Ice pack                  PT Short Term Goals - 06/27/16 1453      PT SHORT TERM GOAL #4   Title walk for 10 minutes without significant fatigue   Time 4   Period Weeks   Status On-going           PT Long Term Goals - 06/23/16 1119      PT LONG TERM GOAL #2   Title improve strength and endurance to walk for 10 minutes without significant fatigue at home   Time 6   Period Weeks   Status On-going     PT LONG TERM GOAL #4   Title improve LE strength to perform sit to stand with minimal UE support   Time 6   Period Weeks   Status On-going     PT LONG TERM GOAL #6   Title ambulate in the clinic with the cane 120 feet consistently without need to rest and progress to SBA   Time 6   Period Weeks   Status On-going               Plan - 06/27/16 1453    Clinical Impression Statement Pt showed some improved picking his feet up when ambulating resulting in a better step length; not 100% of the time but absolutely more than he had been doing. KNee pain was down today so we were able to use the cane today. Pt is going to start using weights at home for his seated exercises.    Rehab Potential Good   Clinical Impairments Affecting Rehab Potential None   PT Frequency 2x / week   PT Duration 6 weeks   PT Treatment/Interventions ADLs/Self Care Home Management;Cryotherapy;Moist Heat;Therapeutic exercise;Therapeutic activities;Functional mobility training;Stair training;Gait training;Neuromuscular re-education;Patient/family education;Manual techniques;Passive range of motion   PT Next Visit Plan Ambulate with cane, chair exs, weightshifting in standing   Consulted and Agree with Plan of Care --      Patient will benefit from skilled therapeutic intervention in order to improve the following deficits and impairments:  Abnormal gait, Difficulty walking, Postural dysfunction, Decreased strength,  Decreased mobility, Decreased balance, Impaired flexibility, Improper body mechanics, Decreased activity tolerance, Cardiopulmonary status limiting activity, Decreased endurance  Visit Diagnosis: Muscle weakness (generalized)  Difficulty in walking, not elsewhere classified  Right knee pain     Problem List Patient Active Problem List   Diagnosis Date Noted  . History of DVT (deep vein thrombosis)   . Long-term (current) use of anticoagulants   . Old anterior myocardial infarction   . Sleep apnea   .  Anemia of chronic disease 10/16/2015  . Chronic combined systolic and diastolic congestive heart failure (HCC) 09/25/2015  . Chronic respiratory failure (HCC) 09/17/2015  . 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 Ischemic cardiomyopathy   . CAD (coronary artery disease), native coronary artery   . Morbid obesity (HCC) 05/20/2015  . Benign prostatic hyperplasia with urinary obstruction 04/24/2015  . Chronic LBP 12/05/2013  . Essential hypertension 09/04/2013    Savyon Loken, PTA 06/27/2016, 3:46 PM   [facility] [address], [address] [place], [place], [address] Phone: [phone]   Fax:  [phone]  Name: Ardyth HarpsHarold D Winne MRN: [id number] Date of Birth: 04/25/1946

## 2016-06-29 ENCOUNTER — Encounter: Payer: Medicare Other | Admitting: Physical Therapy

## 2016-06-30 ENCOUNTER — Encounter: Payer: Self-pay | Admitting: Physical Therapy

## 2016-06-30 ENCOUNTER — Ambulatory Visit: Payer: Medicare Other | Admitting: Physical Therapy

## 2016-06-30 DIAGNOSIS — M6281 Muscle weakness (generalized): Secondary | ICD-10-CM

## 2016-06-30 DIAGNOSIS — R262 Difficulty in walking, not elsewhere classified: Secondary | ICD-10-CM

## 2016-06-30 DIAGNOSIS — M25561 Pain in right knee: Secondary | ICD-10-CM

## 2016-06-30 NOTE — Therapy (Signed)
[facility] [address], [address] [place], [place], [address] Phone: [phone]   Fax:  [phone]  Physical Therapy Treatment  Patient Details  Name: Jonathan HarpsHarold D Stein MRN: [id number] Date of Birth: 10/15/1946 Referring Provider: Elizabeth PalauAnderson, Teresa, NP  Encounter Date: 06/30/2016      PT End of Session - 06/30/16 1616    Visit Number 28   Number of Visits 30   Date for PT Re-Evaluation 07/04/16   Authorization Type Use KX modifier on all visits   PT Start Time 1537   PT Stop Time 1620   PT Time Calculation (min) 43 min   Activity Tolerance Patient limited by pain   Behavior During Therapy [facility] for tasks assessed/performed      Past Medical History:  Diagnosis Date  . Adult situational stress disorder   . Anemia   . Anxiety   . Arthritis   . CHF (congestive heart failure) (HCC)    MI, with open heart surgery x 30 sdays ago per pt. (06/26/15)  . Chronic low back pain   . Decubital ulcer   . DVT (deep venous thrombosis) (HCC)   . ED (erectile dysfunction)   . ETD (eustachian tube dysfunction)   . Ganglion cyst   . GERD (gastroesophageal reflux disease)   . Glaucoma   . Hypertension   . Ileostomy in place [facility])   . MI (myocardial infarction) (HCC)   . Osteoarthritis   . S/P CABG (coronary artery bypass graft)   . Sleep apnea    wears CPAP nightly    Past Surgical History:  Procedure Laterality Date  . CARDIAC CATHETERIZATION N/A 05/20/2015   Procedure: Left Heart Cath and Coronary Angiography;  Surgeon: Kathleene Hazelhristopher D McAlhany, MD;  [facility];  Service: Cardiovascular;  Laterality: N/A;  . CORONARY ARTERY BYPASS GRAFT N/A 05/22/2015   Procedure: CORONARY ARTERY BYPASS GRAFTING (CABG), ON PUMP, TIMES THREE, USING LEFT INTERNAL MAMMARY ARTERY, RIGHT GREATER SAPHENOUS VEIN HARVESTED ENDOSCOPICALLY;  Surgeon: Kerin PernaPeter Van Trigt, MD;  [facility];  Service: Open Heart Surgery;  Laterality: N/A;   LIMA-LAD, SVG-OM, SVG-PD  . EYE SURGERY     bil cataract  . FASCIOTOMY FOOT / TOE Left    foot  . HERNIA REPAIR     right  . LAPAROTOMY N/A 06/18/2015   Procedure: EXPLORATORY LAPAROTOMY;  Surgeon: Emelia LoronMatthew Wakefield, MD;  [facility];  Service: General;  Laterality: N/A;  . LAPAROTOMY N/A 06/20/2015   Procedure: WASHOUT OF ABDOMEN, CLOSURE OF ABDOMINAL WALL WITH PLACEMENT OF WOUND VAC AND ILLEOSTOMY ;  Surgeon: Almond LintFaera Byerly, MD;  Location: [facility] OR;  Service: General;  Laterality: N/A;  . TEE WITHOUT CARDIOVERSION N/A 05/22/2015   Procedure: TRANSESOPHAGEAL ECHOCARDIOGRAM (TEE);  Surgeon: Kerin PernaPeter Van Trigt, MD;  [facility];  Service: Open Heart Surgery;  Laterality: N/A;  . TRACHEOSTOMY TUBE PLACEMENT N/A 07/03/2015   Procedure: TRACHEOSTOMY;  Surgeon: Serena ColonelJefry Rosen, MD;  [facility];  Service: ENT;  Laterality: N/A;  . WISDOM TOOTH EXTRACTION      There were no vitals filed for this visit.      Subjective Assessment - 06/30/16 1546    Subjective Today is the worse day for my knee.    Pertinent History Heart attack: 05/17/15 with CAGB , In and out of hospital and rehab over the past 215 days due to medial complications.  Recent onset of Rt shoulder pain   Limitations Walking;Standing   How long can you  stand comfortably? 5 minutes with UE support   How long can you walk comfortably? 10 minutes with walker   Patient Stated Goals improve strength and endurance, stand longer    Currently in Pain? Yes   Pain Score 8    Pain Location Knee   Pain Orientation Right   Pain Descriptors / Indicators Sore   Pain Type Chronic pain   Pain Radiating Towards None   Pain Onset More than a month ago   Pain Frequency Constant   Aggravating Factors  walking   Pain Relieving Factors sitting reduces pain   Effect of Pain on Daily Activities walking   Multiple Pain Sites No                         [facility] Adult PT Treatment/Exercise - 06/30/16 0001      Knee/Hip Exercises: Aerobic    Nustep L3 x 14 min     Knee/Hip Exercises: Seated   Long Arc Quad Strengthening;5 sets;10 reps;Left;Weights   Long Arc Coca-[employer]aQuad Weight --  4# on left; 2# right 35x   Ball Squeeze 60x   Marching Strengthening;Both;4 sets;10 reps;Weights   Marching Weights --  4#on left; 2# on right due to pain   Abd/Adduction Limitations blue band hip abd 50x     Cryotherapy   Number Minutes Cryotherapy 10 Minutes   Cryotherapy Location Knee  right   Type of Cryotherapy Ice pack                PT Education - 06/30/16 1616    Education provided No          PT Short Term Goals - 06/27/16 1453      PT SHORT TERM GOAL #4   Title walk for 10 minutes without significant fatigue   Time 4   Period Weeks   Status On-going           PT Long Term Goals - 06/23/16 1119      PT LONG TERM GOAL #2   Title improve strength and endurance to walk for 10 minutes without significant fatigue at home   Time 6   Period Weeks   Status On-going     PT LONG TERM GOAL #4   Title improve LE strength to perform sit to stand with minimal UE support   Time 6   Period Weeks   Status On-going     PT LONG TERM GOAL #6   Title ambulate in the clinic with the cane 120 feet consistently without need to rest and progress to SBA   Time 6   Period Weeks   Status On-going               Plan - 06/30/16 1618    Clinical Impression Statement Patient had increased pain in right knee making it difficult to do standing exercises therefore did only the sitting exercises with less weight on right leg.  Patient drove from [place] today and had right knee pain at 8/10.  Patient will benefit from physical therapy to reduce pain and improve ambulation.    Rehab Potential Good   Clinical Impairments Affecting Rehab Potential None   PT Frequency 2x / week   PT Duration 6 weeks   PT Treatment/Interventions ADLs/Self Care Home Management;Cryotherapy;Moist Heat;Therapeutic exercise;Therapeutic  activities;Functional mobility training;Stair training;Gait training;Neuromuscular re-education;Patient/family education;Manual techniques;Passive range of motion   PT Next Visit Plan Ambulate with cane, chair exs, weightshifting in standing; decide if  renewal or discharge   PT Home Exercise Plan progress as needed      Patient will benefit from skilled therapeutic intervention in order to improve the following deficits and impairments:  Abnormal gait, Difficulty walking, Postural dysfunction, Decreased strength, Decreased mobility, Decreased balance, Impaired flexibility, Improper body mechanics, Decreased activity tolerance, Cardiopulmonary status limiting activity, Decreased endurance  Visit Diagnosis: Muscle weakness (generalized)  Difficulty in walking, not elsewhere classified  Right knee pain     Problem List Patient Active Problem List   Diagnosis Date Noted  . History of DVT (deep vein thrombosis)   . Long-term (current) use of anticoagulants   . Old anterior myocardial infarction   . Sleep apnea   . Anemia of chronic disease 10/16/2015  . Chronic combined systolic and diastolic congestive heart failure (HCC) 09/25/2015  . Chronic respiratory failure (HCC) 09/17/2015  . 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 Ischemic cardiomyopathy   . CAD (coronary artery disease), native coronary artery   . Morbid obesity (HCC) 05/20/2015  . Benign prostatic hyperplasia with urinary obstruction 04/24/2015  . Chronic LBP 12/05/2013  . Essential hypertension 09/04/2013    Eulis Foster, PT 06/30/16 4:21 PM   [facility] [address] [place], [place], [address] Phone: [phone]   Fax:  [phone]  Name: JIMMEY HENGEL MRN: [id number] Date of Birth: Feb 27, 1946

## 2016-07-04 ENCOUNTER — Ambulatory Visit: Payer: Medicare Other

## 2016-07-04 DIAGNOSIS — M25561 Pain in right knee: Secondary | ICD-10-CM

## 2016-07-04 DIAGNOSIS — M6281 Muscle weakness (generalized): Secondary | ICD-10-CM

## 2016-07-04 DIAGNOSIS — R262 Difficulty in walking, not elsewhere classified: Secondary | ICD-10-CM

## 2016-07-04 NOTE — Therapy (Signed)
[facility] [address] [place], [place], [address] Phone: [phone]   Fax:  [phone]  Physical Therapy Treatment  Patient Details  Name: Jonathan Le MRN: [id number] Date of Birth: [birth year] Referring Provider: Elizabeth Palau, NP  Encounter Date: 07/04/2016      PT End of Session - 07/04/16 1533    Visit Number 29   Number of Visits 39   Date for PT Re-Evaluation 07/22/16   Authorization Type Use KX modifier on all visits   PT Start Time 1447   PT Stop Time 1545   PT Time Calculation (min) 58 min   Equipment Utilized During Treatment Gait belt   Activity Tolerance Patient tolerated treatment well   Behavior During Therapy [facility] for tasks assessed/performed      Past Medical History:  Diagnosis Date  . Adult situational stress disorder   . Anemia   . Anxiety   . Arthritis   . CHF (congestive heart failure) (HCC)    MI, with open heart surgery x 30 sdays ago per pt. (06/26/15)  . Chronic low back pain   . Decubital ulcer   . DVT (deep venous thrombosis) (HCC)   . ED (erectile dysfunction)   . ETD (eustachian tube dysfunction)   . Ganglion cyst   . GERD (gastroesophageal reflux disease)   . Glaucoma   . Hypertension   . Ileostomy in place [facility])   . MI (myocardial infarction) (HCC)   . Osteoarthritis   . S/P CABG (coronary artery bypass graft)   . Sleep apnea    wears CPAP nightly    Past Surgical History:  Procedure Laterality Date  . CARDIAC CATHETERIZATION N/A 05/20/2015   Procedure: Left Heart Cath and Coronary Angiography;  Surgeon: Kathleene Hazel, MD;  Location: [facility] INVASIVE CV LAB;  Service: Cardiovascular;  Laterality: N/A;  . CORONARY ARTERY BYPASS GRAFT N/A 05/22/2015   Procedure: CORONARY ARTERY BYPASS GRAFTING (CABG), ON PUMP, TIMES THREE, USING LEFT INTERNAL MAMMARY ARTERY, RIGHT GREATER SAPHENOUS VEIN HARVESTED ENDOSCOPICALLY;  Surgeon: Kerin Perna, MD;  [facility]  OR;  Service: Open Heart Surgery;  Laterality: N/A;  LIMA-LAD, SVG-OM, SVG-PD  . EYE SURGERY     bil cataract  . FASCIOTOMY FOOT / TOE Left    foot  . HERNIA REPAIR     right  . LAPAROTOMY N/A 06/18/2015   Procedure: EXPLORATORY LAPAROTOMY;  Surgeon: Emelia Loron, MD;  Location: [facility] OR;  Service: General;  Laterality: N/A;  . LAPAROTOMY N/A 06/20/2015   Procedure: WASHOUT OF ABDOMEN, CLOSURE OF ABDOMINAL WALL WITH PLACEMENT OF WOUND VAC AND ILLEOSTOMY ;  Surgeon: Almond Lint, MD;  Location: [facility] OR;  Service: General;  Laterality: N/A;  . TEE WITHOUT CARDIOVERSION N/A 05/22/2015   Procedure: TRANSESOPHAGEAL ECHOCARDIOGRAM (TEE);  Surgeon: Kerin Perna, MD;  Location: [facility] OR;  Service: Open Heart Surgery;  Laterality: N/A;  . TRACHEOSTOMY TUBE PLACEMENT N/A 07/03/2015   Procedure: TRACHEOSTOMY;  Surgeon: Serena Colonel, MD;  Location: [facility] OR;  Service: ENT;  Laterality: N/A;  . WISDOM TOOTH EXTRACTION      There were no vitals filed for this visit.      Subjective Assessment - 07/04/16 1455    Subjective Pt is moving in 2 weeks.  PT is helping to keep him strong.  Will try to find a gym close to his new house.     Pertinent History Heart attack: 05/17/15 with CAGB , In and out of hospital and  rehab over the past 215 days due to medial complications.  Recent onset of Rt shoulder pain   Currently in Pain? Yes   Pain Score 5    Pain Location Knee   Pain Orientation Right   Pain Descriptors / Indicators Sore   Pain Type Chronic pain   Pain Onset More than a month ago   Pain Frequency Constant   Aggravating Factors  walking   Pain Relieving Factors sitting reduces pain            [facility] PT Assessment - 07/04/16 0001      Assessment   Medical Diagnosis decreased strength (R53.1)     Precautions   Precautions Fall     Palpation   Palpation comment pitting edema in bil. legs     Ambulation/Gait   Ambulation Surface Level;Indoor                     [facility] Adult PT  Treatment/Exercise - 07/04/16 0001      Ambulation/Gait   Ambulation Distance (Feet) --  2x 45 feet with rest break     Knee/Hip Exercises: Aerobic   Nustep L3 x 14 min     Knee/Hip Exercises: Seated   Long Arc Quad Strengthening;5 sets;10 reps;Left;Weights   Long Arc Quad Weight 4 lbs.  2# on the Rt   [employer]son[employer] Squeeze 60x   Marching Strengthening;Both;4 sets;10 reps;Weights   Marching Weights --  4#on left; 2# on right due to pain   Abd/Adduction Limitations blue band hip abd 50x     Cryotherapy   Number Minutes Cryotherapy 10 Minutes   Cryotherapy Location Knee   Type of Cryotherapy Ice pack                  PT Short Term Goals - 06/27/16 1453      PT SHORT TERM GOAL #4   Title walk for 10 minutes without significant fatigue   Time 4   Period Weeks   Status On-going           PT Long Term Goals - 07/04/16 1451      PT LONG TERM GOAL #1   Title be independent in advanced HEP   Time 2   Period Weeks   Status On-going     PT LONG TERM GOAL #2   Title improve strength and endurance to walk for 10 minutes without significant fatigue at home   Time 2   Period Weeks   Status On-going     PT LONG TERM GOAL #3   Title perform TUG in < or = to 18 seconds to reduce falls risk   Status Achieved     PT LONG TERM GOAL #4   Title improve LE strength to perform sit to stand with minimal UE support   Time 2   Period Weeks   Status On-going     PT LONG TERM GOAL #5   Title wean from walker 50% of the time for home distances   Status Achieved     PT LONG TERM GOAL #6   Title ambulate in the clinic with the cane 120 feet consistently without need to rest and progress to SBA   Time 2   Period Weeks   Status On-going               Plan - 07/04/16 1456    Clinical Impression Statement Pt with Rt knee pain that limits his ability to stand and  to exercise.  Pt able to tolerate increased reps and weight with seated exercises. Pt will pursue a gym  closer to his new house as he is moving in 2 weeks.  Pt will attend PT for the next 2 weeks to finalize and advance HEP and gym exercise program.     Rehab Potential Good   PT Frequency 2x / week   PT Duration 2 weeks   PT Treatment/Interventions ADLs/Self Care Home Management;Cryotherapy;Moist Heat;Therapeutic exercise;Therapeutic activities;Functional mobility training;Stair training;Gait training;Neuromuscular re-education;Patient/family education;Manual techniques;Passive range of motion   PT Next Visit Plan Ambulate with cane, chair exs, weightshifting in standing; 2 more weeks   Consulted and Agree with Plan of Care Patient      Patient will benefit from skilled therapeutic intervention in order to improve the following deficits and impairments:  Abnormal gait, Difficulty walking, Postural dysfunction, Decreased strength, Decreased mobility, Decreased balance, Impaired flexibility, Improper body mechanics, Decreased activity tolerance, Cardiopulmonary status limiting activity, Decreased endurance  Visit Diagnosis: Muscle weakness (generalized)  Difficulty in walking, not elsewhere classified  Right knee pain       G-Codes - 07/04/16 1506    Functional Assessment Tool Used Clinical judgement   Functional Limitation Mobility: Walking and moving around   Mobility: Walking and Moving Around Current Status [phone]([id number]) At least 40 percent but less than 60 percent impaired, limited or restricted   Mobility: Walking and Moving Around Goal Status [phone]([id number]) At least 40 percent but less than 60 percent impaired, limited or restricted      Problem List Patient Active Problem List   Diagnosis Date Noted  . History of DVT (deep vein thrombosis)   . Long-term (current) use of anticoagulants   . Old anterior myocardial infarction   . Sleep apnea   . Anemia of chronic disease 10/16/2015  . Chronic combined systolic and diastolic congestive heart failure (HCC) 09/25/2015  . Chronic respiratory  failure (HCC) 09/17/2015  . 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 Ischemic cardiomyopathy   . CAD (coronary artery disease), native coronary artery   . Morbid obesity (HCC) 05/20/2015  . Benign prostatic hyperplasia with urinary obstruction 04/24/2015  . Chronic LBP 12/05/2013  . Essential hypertension 09/04/2013     Lorrene ReidKelly Takacs, PT 07/04/16 3:35 PM  [facility] [address] [place], [place], [address] Phone: [phone]   Fax:  [phone]  Name: Jonathan Le MRN: [id number] Date of Birth: 09/15/1946

## 2016-07-06 ENCOUNTER — Encounter: Payer: Self-pay | Admitting: Physical Therapy

## 2016-07-06 ENCOUNTER — Ambulatory Visit: Payer: Medicare Other | Admitting: Physical Therapy

## 2016-07-06 DIAGNOSIS — M25561 Pain in right knee: Secondary | ICD-10-CM

## 2016-07-06 DIAGNOSIS — R262 Difficulty in walking, not elsewhere classified: Secondary | ICD-10-CM

## 2016-07-06 DIAGNOSIS — M6281 Muscle weakness (generalized): Secondary | ICD-10-CM

## 2016-07-06 NOTE — Therapy (Signed)
[facility] [address], [address] [place], [place], [address] Phone: [phone]   Fax:  [phone]  Physical Therapy Treatment  Patient Details  Name: Jonathan Le MRN: [id number] Date of Birth: 04/24/1946 Referring Provider: Elizabeth PalauAnderson, Teresa, NP  Encounter Date: 07/06/2016      PT End of Session - 07/06/16 1550    Visit Number 30   Number of Visits 39   Date for PT Re-Evaluation 07/22/16   Authorization Type Use KX modifier on all visits   PT Start Time 1530   PT Stop Time 1625   PT Time Calculation (min) 55 min   Activity Tolerance Patient limited by pain   Behavior [facility] for tasks assessed/performed      Past Medical History:  Diagnosis Date  . Adult situational stress disorder   . Anemia   . Anxiety   . Arthritis   . CHF (congestive heart failure) (HCC)    MI, with open heart surgery x 30 sdays ago per pt. (06/26/15)  . Chronic low back pain   . Decubital ulcer   . DVT (deep venous thrombosis) (HCC)   . ED (erectile dysfunction)   . ETD (eustachian tube dysfunction)   . Ganglion cyst   . GERD (gastroesophageal reflux disease)   . Glaucoma   . Hypertension   . Ileostomy in place [facility](HCC)   . MI (myocardial infarction) (HCC)   . Osteoarthritis   . S/P CABG (coronary artery bypass graft)   . Sleep apnea    wears CPAP nightly    Past Surgical History:  Procedure Laterality Date  . CARDIAC CATHETERIZATION N/A 05/20/2015   Procedure: Left Heart Cath and Coronary Angiography;  Surgeon: Kathleene Hazelhristopher D McAlhany, MD;  [facility];  Service: Cardiovascular;  Laterality: N/A;  . CORONARY ARTERY BYPASS GRAFT N/A 05/22/2015   Procedure: CORONARY ARTERY BYPASS GRAFTING (CABG), ON PUMP, TIMES THREE, USING LEFT INTERNAL MAMMARY ARTERY, RIGHT GREATER SAPHENOUS VEIN HARVESTED ENDOSCOPICALLY;  Surgeon: Kerin PernaPeter Van Trigt, MD;  [facility];  Service: Open Heart Surgery;  Laterality: N/A;   LIMA-LAD, SVG-OM, SVG-PD  . EYE SURGERY     bil cataract  . FASCIOTOMY FOOT / TOE Left    foot  . HERNIA REPAIR     right  . LAPAROTOMY N/A 06/18/2015   Procedure: EXPLORATORY LAPAROTOMY;  Surgeon: Emelia LoronMatthew Wakefield, MD;  [facility];  Service: General;  Laterality: N/A;  . LAPAROTOMY N/A 06/20/2015   Procedure: WASHOUT OF ABDOMEN, CLOSURE OF ABDOMINAL WALL WITH PLACEMENT OF WOUND VAC AND ILLEOSTOMY ;  Surgeon: Almond LintFaera Byerly, MD;  Location: [facility] OR;  Service: General;  Laterality: N/A;  . TEE WITHOUT CARDIOVERSION N/A 05/22/2015   Procedure: TRANSESOPHAGEAL ECHOCARDIOGRAM (TEE);  Surgeon: Kerin PernaPeter Van Trigt, MD;  [facility];  Service: Open Heart Surgery;  Laterality: N/A;  . TRACHEOSTOMY TUBE PLACEMENT N/A 07/03/2015   Procedure: TRACHEOSTOMY;  Surgeon: Serena ColonelJefry Rosen, MD;  [facility];  Service: ENT;  Laterality: N/A;  . WISDOM TOOTH EXTRACTION      There were no vitals filed for this visit.      Subjective Assessment - 07/06/16 1554    Subjective Pt has been walking a lot in the home with his cane as they are packing and organizing.    Pain Score 6    Pain Location Knee   Pain Orientation Right   Pain Descriptors / Indicators Sore   Multiple Pain Sites No  [facility] Adult PT Treatment/Exercise - 07/06/16 0001      Ambulation/Gait   Ambulation Distance (Feet) --  104 feet with cane and SBA   Ambulation Surface Level;Indoor     Knee/Hip Exercises: Seated   Long Arc Quad Strengthening;5 sets;10 reps;Left;Weights   Long Arc [employer] --  4# on LT 35x, 4# 30X on RT   [employer] Strengthening;Both;4 sets;10 reps;Weights   Marching Weights 4 lbs.   Abd/Adduction Limitations blue band hip abd 70x     Cryotherapy   Number Minutes Cryotherapy 10 Minutes   Cryotherapy Location Knee   Type of Cryotherapy Ice pack                  PT Short Term Goals - 06/27/16 1453      PT SHORT TERM GOAL #4   Title walk for  10 minutes without significant fatigue   Time 4   Period Weeks   Status On-going           PT Long Term Goals - 07/04/16 1451      PT LONG TERM GOAL #1   Title be independent in advanced HEP   Time 2   Period Weeks   Status On-going     PT LONG TERM GOAL #2   Title improve strength and endurance to walk for 10 minutes without significant fatigue at home   Time 2   Period Weeks   Status On-going     PT LONG TERM GOAL #3   Title perform TUG in < or = to 18 seconds to reduce falls risk   Status Achieved     PT LONG TERM GOAL #4   Title improve LE strength to perform sit to stand with minimal UE support   Time 2   Period Weeks   Status On-going     PT LONG TERM GOAL #5   Title wean from walker 50% of the time for home distances   Status Achieved     PT LONG TERM GOAL #6   Title ambulate in the clinic with the cane 120 feet consistently without need to rest and progress to SBA   Time 2   Period Weeks   Status On-going               Plan - 07/06/16 1602    Clinical Impression Statement A lot of Rt knee pain today that pt contributes to a lot of walking with his cane at home over the last few days as he is packing, organizing, and cleaning.   Rehab Potential Good   Clinical Impairments Affecting Rehab Potential None   PT Frequency 2x / week   PT Duration 2 weeks   PT Treatment/Interventions ADLs/Self Care Home Management;Cryotherapy;Moist Heat;Therapeutic exercise;Therapeutic activities;Functional mobility training;Stair training;Gait training;Neuromuscular re-education;Patient/family education;Manual techniques;Passive range of motion   PT Next Visit Plan Ambulate with cane, chair exs, weightshifting in standing;    Consulted and Agree with Plan of Care Patient      Patient will benefit from skilled therapeutic intervention in order to improve the following deficits and impairments:  Abnormal gait, Difficulty walking, Postural dysfunction, Decreased  strength, Decreased mobility, Decreased balance, Impaired flexibility, Improper body mechanics, Decreased activity tolerance, Cardiopulmonary status limiting activity, Decreased endurance  Visit Diagnosis: Muscle weakness (generalized)  Difficulty in walking, not elsewhere classified  Right knee pain     Problem List Patient Active Problem List   Diagnosis Date Noted  . History of  DVT (deep vein thrombosis)   . Long-term (current) use of anticoagulants   . Old anterior myocardial infarction   . Sleep apnea   . Anemia of chronic disease 10/16/2015  . Chronic combined systolic and diastolic congestive heart failure (HCC) 09/25/2015  . Chronic respiratory failure (HCC) 09/17/2015  . 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 Ischemic cardiomyopathy   . CAD (coronary artery disease), native coronary artery   . Morbid obesity (HCC) 05/20/2015  . Benign prostatic hyperplasia with urinary obstruction 04/24/2015  . Chronic LBP 12/05/2013  . Essential hypertension 09/04/2013    Tyisha Cressy, PTA 07/06/2016, 4:16 PM  [facility] [address], [address] [place], [place], [address] Phone: [phone]   Fax:  [phone]  Name: Jonathan Le MRN: [id number] Date of Birth: 06/26/1946

## 2016-07-11 ENCOUNTER — Ambulatory Visit: Payer: Medicare Other | Admitting: Physical Therapy

## 2016-07-11 ENCOUNTER — Encounter: Payer: Self-pay | Admitting: Physical Therapy

## 2016-07-11 DIAGNOSIS — M6281 Muscle weakness (generalized): Secondary | ICD-10-CM

## 2016-07-11 DIAGNOSIS — R262 Difficulty in walking, not elsewhere classified: Secondary | ICD-10-CM

## 2016-07-11 DIAGNOSIS — K651 Peritoneal abscess: Secondary | ICD-10-CM

## 2016-07-11 DIAGNOSIS — L0291 Cutaneous abscess, unspecified: Secondary | ICD-10-CM

## 2016-07-11 DIAGNOSIS — M25561 Pain in right knee: Secondary | ICD-10-CM

## 2016-07-11 NOTE — Therapy (Signed)
[facility] [address] [place], [place], [address] Phone: [phone]   Fax:  [phone]  Physical Therapy Treatment  Patient Details  Name: Jonathan Le MRN: [id number] Date of Birth: 1946-05-19 Referring Provider: Elizabeth Palau, NP  Encounter Date: 07/11/2016      PT End of Session - 07/11/16 1512    Visit Number 31   Number of Visits 39   Date for PT Re-Evaluation 07/22/16   Authorization Type Use KX modifier on all visits   PT Start Time 1550   PT Stop Time 1650   PT Time Calculation (min) 60 min   Equipment Utilized During Treatment Gait belt   Activity Tolerance Patient limited by pain   Behavior During Therapy [facility] for tasks assessed/performed      Past Medical History:  Diagnosis Date  . Adult situational stress disorder   . Anemia   . Anxiety   . Arthritis   . CHF (congestive heart failure) (HCC)    MI, with open heart surgery x 30 sdays ago per pt. (06/26/15)  . Chronic low back pain   . Decubital ulcer   . DVT (deep venous thrombosis) (HCC)   . ED (erectile dysfunction)   . ETD (eustachian tube dysfunction)   . Ganglion cyst   . GERD (gastroesophageal reflux disease)   . Glaucoma   . Hypertension   . Ileostomy in place [facility])   . MI (myocardial infarction) (HCC)   . Osteoarthritis   . S/P CABG (coronary artery bypass graft)   . Sleep apnea    wears CPAP nightly    Past Surgical History:  Procedure Laterality Date  . CARDIAC CATHETERIZATION N/A 05/20/2015   Procedure: Left Heart Cath and Coronary Angiography;  Surgeon: Kathleene Hazel, MD;  Location: [facility] INVASIVE CV LAB;  Service: Cardiovascular;  Laterality: N/A;  . CORONARY ARTERY BYPASS GRAFT N/A 05/22/2015   Procedure: CORONARY ARTERY BYPASS GRAFTING (CABG), ON PUMP, TIMES THREE, USING LEFT INTERNAL MAMMARY ARTERY, RIGHT GREATER SAPHENOUS VEIN HARVESTED ENDOSCOPICALLY;  Surgeon: Kerin Perna, MD;  Location: [facility] OR;   Service: Open Heart Surgery;  Laterality: N/A;  LIMA-LAD, SVG-OM, SVG-PD  . EYE SURGERY     bil cataract  . FASCIOTOMY FOOT / TOE Left    foot  . HERNIA REPAIR     right  . LAPAROTOMY N/A 06/18/2015   Procedure: EXPLORATORY LAPAROTOMY;  Surgeon: Emelia Loron, MD;  Location: [facility] OR;  Service: General;  Laterality: N/A;  . LAPAROTOMY N/A 06/20/2015   Procedure: WASHOUT OF ABDOMEN, CLOSURE OF ABDOMINAL WALL WITH PLACEMENT OF WOUND VAC AND ILLEOSTOMY ;  Surgeon: Almond Lint, MD;  Location: [facility] OR;  Service: General;  Laterality: N/A;  . TEE WITHOUT CARDIOVERSION N/A 05/22/2015   Procedure: TRANSESOPHAGEAL ECHOCARDIOGRAM (TEE);  Surgeon: Kerin Perna, MD;  Location: [facility] OR;  Service: Open Heart Surgery;  Laterality: N/A;  . TRACHEOSTOMY TUBE PLACEMENT N/A 07/03/2015   Procedure: TRACHEOSTOMY;  Surgeon: Serena Colonel, MD;  Location: [facility] OR;  Service: ENT;  Laterality: N/A;  . WISDOM TOOTH EXTRACTION      There were no vitals filed for this visit.      Subjective Assessment - 07/11/16 1503    Subjective Pt reports some stiffness today after packing boxes at home.   Pertinent History Heart attack: 05/17/15 with CAGB , In and out of hospital and rehab over the past 215 days due to medial complications.  Recent onset of Rt shoulder pain  Limitations Walking;Standing   How long can you stand comfortably? 5 minutes with UE support   How long can you walk comfortably? 10 minutes with walker   Patient Stated Goals improve strength and endurance, stand longer    Currently in Pain? Yes   Pain Score 5    Pain Location Knee   Pain Orientation Right   Pain Descriptors / Indicators Sore   Pain Type Chronic pain   Pain Onset More than a month ago   Pain Frequency Constant   Multiple Pain Sites No   Pain Location Knee   Pain Orientation Left   Pain Type Chronic pain   Pain Frequency Intermittent                         [facility] Adult PT Treatment/Exercise - 07/11/16 0001       Ambulation/Gait   Ambulation Distance (Feet) --  104 feet with cane and SBA     Knee/Hip Exercises: Aerobic   Nustep L3 x 14 min     Knee/Hip Exercises: Seated   Long Arc Quad Strengthening;5 sets;10 reps;Left;Weights   Long Arc Coca-ColaQuad Weight --  4# on LT 35x, 4# 30X on RT   LandAmerica FinancialBall Squeeze 60x   Marching Strengthening;Both;4 sets;10 reps;Weights   Marching Weights 4 lbs.   Abd/Adduction Limitations blue band hip abd 70x     Cryotherapy   Number Minutes Cryotherapy 10 Minutes   Cryotherapy Location Knee   Type of Cryotherapy Ice pack                  PT Short Term Goals - 07/11/16 1505      PT SHORT TERM GOAL #1   Title be independent in initial HEP   Time 4   Period Weeks   Status Achieved     PT SHORT TERM GOAL #2   Title improve LE strength to perform sit to stand with moderate UE support   Time 4   Period Weeks   Status Achieved     PT SHORT TERM GOAL #3   Title perform TUG in < or = to 21 seconds to reduce risk of falls   Time 4   Period Weeks   Status Achieved     PT SHORT TERM GOAL #4   Title walk for 10 minutes without significant fatigue   Time 4   Period Weeks   Status On-going           PT Long Term Goals - 07/11/16 1505      PT LONG TERM GOAL #1   Title be independent in advanced HEP   Time 2   Period Weeks   Status On-going     PT LONG TERM GOAL #2   Title improve strength and endurance to walk for 10 minutes without significant fatigue at home   Time 2   Period Weeks   Status On-going     PT LONG TERM GOAL #3   Title perform TUG in < or = to 18 seconds to reduce falls risk   Period Weeks   Status Achieved     PT LONG TERM GOAL #4   Title improve LE strength to perform sit to stand with minimal UE support   Time 2   Period Weeks   Status On-going     PT LONG TERM GOAL #5   Title wean from walker 50% of the time for home distances   Time 6  Period Weeks   Status Achieved     PT LONG TERM GOAL #6   Title ambulate  in the clinic with the cane 120 feet consistently without need to rest and progress to SBA   Time 2   Period Weeks   Status On-going               Plan - 07/11/16 1521    Clinical Impression Statement Knee stiffness today after pushing boxes with his legs at home preparing to move.    Rehab Potential Good   Clinical Impairments Affecting Rehab Potential None   PT Frequency 2x / week   PT Duration 2 weeks   PT Treatment/Interventions ADLs/Self Care Home Management;Cryotherapy;Moist Heat;Therapeutic exercise;Therapeutic activities;Functional mobility training;Stair training;Gait training;Neuromuscular re-education;Patient/family education;Manual techniques;Passive range of motion   PT Next Visit Plan Ambulate with cane, chair exs, weightshifting in standing;    PT Home Exercise Plan progress as needed   Consulted and Agree with Plan of Care Patient      Patient will benefit from skilled therapeutic intervention in order to improve the following deficits and impairments:  Abnormal gait, Difficulty walking, Postural dysfunction, Decreased strength, Decreased mobility, Decreased balance, Impaired flexibility, Improper body mechanics, Decreased activity tolerance, Cardiopulmonary status limiting activity, Decreased endurance  Visit Diagnosis: Muscle weakness (generalized)  Difficulty in walking, not elsewhere classified  Right knee pain  Abscess of abdominal cavity (HCC)  Abscess     Problem List Patient Active Problem List   Diagnosis Date Noted  . History of DVT (deep vein thrombosis)   . Long-term (current) use of anticoagulants   . Old anterior myocardial infarction   . Sleep apnea   . Anemia of chronic disease 10/16/2015  . Chronic combined systolic and diastolic congestive heart failure (HCC) 09/25/2015  . Chronic respiratory failure (HCC) 09/17/2015  . 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 Ischemic cardiomyopathy   . CAD (coronary artery disease), native coronary artery    . Morbid obesity (HCC) 05/20/2015  . Benign prostatic hyperplasia with urinary obstruction 04/24/2015  . Chronic LBP 12/05/2013  . Essential hypertension 09/04/2013    Dessa Phi PTA 07/11/2016, 4:10 PM  [facility] [address], [address] [place], [place], [address] Phone: [phone]   Fax:  [phone]  Name: YIFAN AUKER MRN: [id number] Date of Birth: [birth year]

## 2016-07-13 ENCOUNTER — Encounter: Payer: Self-pay | Admitting: Physical Therapy

## 2016-07-13 ENCOUNTER — Ambulatory Visit: Payer: Medicare Other | Admitting: Physical Therapy

## 2016-07-13 DIAGNOSIS — R262 Difficulty in walking, not elsewhere classified: Secondary | ICD-10-CM

## 2016-07-13 DIAGNOSIS — M6281 Muscle weakness (generalized): Secondary | ICD-10-CM | POA: Diagnosis not present

## 2016-07-13 DIAGNOSIS — M25561 Pain in right knee: Secondary | ICD-10-CM

## 2016-07-13 NOTE — Therapy (Addendum)
[facility] [address], [place], [place], [address] Phone: [phone]   Fax:  [phone]  Physical Therapy Treatment  Patient Details  Name: Jonathan Le MRN: [id number] Date of Birth: 10/15/1946 Referring Provider: Vicenta Aly, NP  Encounter Date: 07/13/2016      PT End of Session - 07/13/16 1533    Visit Number 32   Number of Visits 39   Date for PT Re-Evaluation 07/22/16   Authorization Type Use KX modifier on all visits   PT Start Time 5573   PT Stop Time 1543   PT Time Calculation (min) 56 min   Activity Tolerance Patient limited by pain   Behavior During Therapy [facility] for tasks assessed/performed      Past Medical History:  Diagnosis Date  . Adult situational stress disorder   . Anemia   . Anxiety   . Arthritis   . CHF (congestive heart failure) (HCC)    MI, with open heart surgery x 30 sdays ago per pt. (06/26/15)  . Chronic low back pain   . Decubital ulcer   . DVT (deep venous thrombosis) ([place])   . ED (erectile dysfunction)   . ETD (eustachian tube dysfunction)   . Ganglion cyst   . GERD (gastroesophageal reflux disease)   . Glaucoma   . Hypertension   . Ileostomy in place [facility]   . MI (myocardial infarction) ([place])   . Osteoarthritis   . S/P CABG (coronary artery bypass graft)   . Sleep apnea    wears CPAP nightly    Past Surgical History:  Procedure Laterality Date  . CARDIAC CATHETERIZATION N/A 05/20/2015   Procedure: Left Heart Cath and Coronary Angiography;  Surgeon: Burnell Blanks, MD;  Location: [place] CV LAB;  Service: Cardiovascular;  Laterality: N/A;  . CORONARY ARTERY BYPASS GRAFT N/A 05/22/2015   Procedure: CORONARY ARTERY BYPASS GRAFTING (CABG), ON PUMP, TIMES THREE, USING LEFT INTERNAL MAMMARY ARTERY, RIGHT GREATER SAPHENOUS VEIN HARVESTED ENDOSCOPICALLY;  Surgeon: Ivin Poot, MD;  Location: [place];  Service: Open Heart Surgery;  Laterality: N/A;   LIMA-LAD, SVG-OM, SVG-PD  . EYE SURGERY     bil cataract  . FASCIOTOMY FOOT / TOE Left    foot  . HERNIA REPAIR     right  . LAPAROTOMY N/A 06/18/2015   Procedure: EXPLORATORY LAPAROTOMY;  Surgeon: Rolm Bookbinder, MD;  Location: [place];  Service: General;  Laterality: N/A;  . LAPAROTOMY N/A 06/20/2015   Procedure: WASHOUT OF ABDOMEN, CLOSURE OF ABDOMINAL WALL WITH PLACEMENT OF WOUND VAC AND ILLEOSTOMY ;  Surgeon: Stark Klein, MD;  Location: [place];  Service: General;  Laterality: N/A;  . TEE WITHOUT CARDIOVERSION N/A 05/22/2015   Procedure: TRANSESOPHAGEAL ECHOCARDIOGRAM (TEE);  Surgeon: Ivin Poot, MD;  Location: [place];  Service: Open Heart Surgery;  Laterality: N/A;  . TRACHEOSTOMY TUBE PLACEMENT N/A 07/03/2015   Procedure: TRACHEOSTOMY;  Surgeon: Izora Gala, MD;  Location: [place];  Service: ENT;  Laterality: N/A;  . WISDOM TOOTH EXTRACTION      There were no vitals filed for this visit.      Subjective Assessment - 07/13/16 1451    Subjective RT knee pain.   Currently in Pain? Yes   Pain Score 5    Pain Location Knee   Pain Orientation Right   Pain Descriptors / Indicators Sore   Aggravating Factors  Walking   Pain Relieving Factors Sitting   Multiple Pain Sites No  [facility] PT Assessment - 07/13/16 0001      Timed Up and Go Test   TUG --  19 sec, 15 sec                     [facility] Adult PT Treatment/Exercise - 07/13/16 0001      Knee/Hip Exercises: Aerobic   Nustep L3 x 14 min     Knee/Hip Exercises: Seated   Ball Squeeze 80x   Marching Strengthening;Both;4 sets;10 reps;Weights   Marching Weights 4 lbs.   Abd/Adduction Limitations blue band hip abd 70x     Cryotherapy   Number Minutes Cryotherapy 10 Minutes   Cryotherapy Location Knee   Type of Cryotherapy Ice pack                  PT Short Term Goals - 07/13/16 1531      PT SHORT TERM GOAL #4   Title walk for 10 minutes without significant fatigue   Time 4   Period  Weeks   Status Not Met           PT Long Term Goals - 07/13/16 1531      PT LONG TERM GOAL #1   Title be independent in advanced HEP   Time 2   Period Weeks   Status On-going     PT LONG TERM GOAL #2   Title improve strength and endurance to walk for 10 minutes without significant fatigue at home   Time 2   Period Weeks   Status On-going     PT LONG TERM GOAL #4   Title improve LE strength to perform sit to stand with minimal UE support   Time 2   Period Weeks   Status Achieved               Plan - 07/13/16 1534    Clinical Impression Statement My knee really hurts today, pt with low energy today, almost depressed today.    Rehab Potential Good   Clinical Impairments Affecting Rehab Potential None   PT Frequency 2x / week   PT Duration 2 weeks   PT Treatment/Interventions ADLs/Self Care Home Management;Cryotherapy;Moist Heat;Therapeutic exercise;Therapeutic activities;Functional mobility training;Stair training;Gait training;Neuromuscular re-education;Patient/family education;Manual techniques;Passive range of motion   PT Next Visit Plan DC next visit.   Consulted and Agree with Plan of Care Patient      Patient will benefit from skilled therapeutic intervention in order to improve the following deficits and impairments:  Abnormal gait, Difficulty walking, Postural dysfunction, Decreased strength, Decreased mobility, Decreased balance, Impaired flexibility, Improper body mechanics, Decreased activity tolerance, Cardiopulmonary status limiting activity, Decreased endurance  Visit Diagnosis: Muscle weakness (generalized)  Difficulty in walking, not elsewhere classified  Right knee pain     Problem List Patient Active Problem List   Diagnosis Date Noted  . History of DVT (deep vein thrombosis)   . Long-term (current) use of anticoagulants   . Old anterior myocardial infarction   . Sleep apnea   . Anemia of chronic disease 10/16/2015  . Chronic  combined systolic and diastolic congestive heart failure (HCC) 09/25/2015  . Chronic respiratory failure (HCC) 09/17/2015  . 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 Ischemic cardiomyopathy   . CAD (coronary artery disease), native coronary artery   . Morbid obesity (HCC) 05/20/2015  . Benign prostatic hyperplasia with urinary obstruction 04/24/2015  . Chronic LBP 12/05/2013  . Essential hypertension 09/04/2013    ,, PTA 07/13/2016, 3:37 PM     G-codes: Mobility Category  Goal Status: CK D/C status CK  PHYSICAL THERAPY DISCHARGE SUMMARY  Visits from Start of Care: 32  Current functional level related to goals / functional outcomes: See above for most current status.  Pt will continue with strength progression at home.  Pt was moving away and discharged to HEP.    Remaining deficits: Pt with significant weakness and mobility deficits due to chronic nature of condition.  Pt with knee pain that limits standing.     Education / Equipment: HEP Plan: Patient agrees to discharge.  Patient goals were partially met. Patient is being discharged due to the patient's request.  ?due to moving.???        [facility] [address], [address] [place], [place], [address] Phone: [phone]   Fax:  [phone]  Name: Sou D Licklider MRN: [id number] Date of Birth: 01/11/1946    

## 2016-07-20 ENCOUNTER — Ambulatory Visit: Payer: Medicare Other

## 2016-07-28 NOTE — Progress Notes (Signed)
This encounter was created in error - please disregard.

## 2016-08-29 DIAGNOSIS — M778 Other enthesopathies, not elsewhere classified: Secondary | ICD-10-CM

## 2016-08-29 DIAGNOSIS — M7581 Other shoulder lesions, right shoulder: Secondary | ICD-10-CM

## 2016-08-29 HISTORY — DX: Other enthesopathies, not elsewhere classified: M77.8

## 2016-08-31 ENCOUNTER — Ambulatory Visit: Payer: Medicare Other | Attending: Nurse Practitioner | Admitting: Physical Therapy

## 2016-08-31 ENCOUNTER — Encounter: Payer: Self-pay | Admitting: Physical Therapy

## 2016-08-31 DIAGNOSIS — M6281 Muscle weakness (generalized): Secondary | ICD-10-CM | POA: Diagnosis present

## 2016-08-31 DIAGNOSIS — M25611 Stiffness of right shoulder, not elsewhere classified: Secondary | ICD-10-CM | POA: Diagnosis present

## 2016-08-31 DIAGNOSIS — M25511 Pain in right shoulder: Secondary | ICD-10-CM | POA: Diagnosis present

## 2016-08-31 DIAGNOSIS — G8929 Other chronic pain: Secondary | ICD-10-CM | POA: Insufficient documentation

## 2016-08-31 NOTE — Therapy (Signed)
[facility] [address], [address] [place], [place], [address] Phone: [phone]   Fax:  [phone]  Physical Therapy Evaluation  Patient Details  Name: Jonathan HarpsHarold D Bhat MRN: [id number] Date of Birth: 05/29/1946 Referring Provider: Dr. Elizabeth Palaueresa Anderson  Encounter Date: 08/31/2016      PT End of Session - 08/31/16 1308    Visit Number 1   Number of Visits 10   Date for PT Re-Evaluation 10/26/16   Authorization Type Use KX modifier on all visits; g-code on 10th visit   Authorization - Visit Number 39  for year so far   PT Start Time 1230   PT Stop Time 1306   PT Time Calculation (min) 36 min   Activity Tolerance Patient tolerated treatment well   Behavior During Therapy [facility] for tasks assessed/performed      Past Medical History:  Diagnosis Date  . Adult situational stress disorder   . Anemia   . Anxiety   . Arthritis   . CHF (congestive heart failure) (HCC)    MI, with open heart surgery x 30 sdays ago per pt. (06/26/15)  . Chronic low back pain   . Decubital ulcer   . DVT (deep venous thrombosis) (HCC)   . ED (erectile dysfunction)   . ETD (eustachian tube dysfunction)   . Ganglion cyst   . GERD (gastroesophageal reflux disease)   . Glaucoma   . Hypertension   . Ileostomy in place [facility](HCC)   . MI (myocardial infarction)   . Osteoarthritis   . S/P CABG (coronary artery bypass graft)   . Sleep apnea    wears CPAP nightly    Past Surgical History:  Procedure Laterality Date  . CARDIAC CATHETERIZATION N/A 05/20/2015   Procedure: Left Heart Cath and Coronary Angiography;  Surgeon: Kathleene Hazelhristopher D McAlhany, MD;  [facility];  Service: Cardiovascular;  Laterality: N/A;  . CORONARY ARTERY BYPASS GRAFT N/A 05/22/2015   Procedure: CORONARY ARTERY BYPASS GRAFTING (CABG), ON PUMP, TIMES THREE, USING LEFT INTERNAL MAMMARY ARTERY, RIGHT GREATER SAPHENOUS VEIN HARVESTED ENDOSCOPICALLY;  Surgeon: Kerin PernaPeter Van Trigt,  MD;  [facility];  Service: Open Heart Surgery;  Laterality: N/A;  LIMA-LAD, SVG-OM, SVG-PD  . EYE SURGERY     bil cataract  . FASCIOTOMY FOOT / TOE Left    foot  . HERNIA REPAIR     right  . LAPAROTOMY N/A 06/18/2015   Procedure: EXPLORATORY LAPAROTOMY;  Surgeon: Emelia LoronMatthew Wakefield, MD;  [facility];  Service: General;  Laterality: N/A;  . LAPAROTOMY N/A 06/20/2015   Procedure: WASHOUT OF ABDOMEN, CLOSURE OF ABDOMINAL WALL WITH PLACEMENT OF WOUND VAC AND ILLEOSTOMY ;  Surgeon: Almond LintFaera Byerly, MD;  Location: [facility] OR;  Service: General;  Laterality: N/A;  . TEE WITHOUT CARDIOVERSION N/A 05/22/2015   Procedure: TRANSESOPHAGEAL ECHOCARDIOGRAM (TEE);  Surgeon: Kerin PernaPeter Van Trigt, MD;  [facility];  Service: Open Heart Surgery;  Laterality: N/A;  . TRACHEOSTOMY TUBE PLACEMENT N/A 07/03/2015   Procedure: TRACHEOSTOMY;  Surgeon: Serena ColonelJefry Rosen, MD;  [facility];  Service: ENT;  Laterality: N/A;  . WISDOM TOOTH EXTRACTION      There were no vitals filed for this visit.       Subjective Assessment - 08/31/16 1234    Subjective Patient reports right sholder pain 01/2016  when in [facility].  Patient was doing shoulder and knee exercises.  He had to stop the shoulder therapy due to the pain. Right shoulder wakes him up 3  times per night.    Pertinent History Heart attack: 05/17/15 with CAGB , In and out of hospital and rehab over the past 215 days due to medial complications.  Recent onset of Rt shoulder pain   Patient Stated Goals sleeping, raising right arm overhead,    Currently in Pain? Yes   Pain Score 8    Pain Location Shoulder   Pain Orientation Right   Pain Descriptors / Indicators Stabbing   Pain Type Chronic pain   Pain Onset More than a month ago   Pain Frequency Intermittent   Aggravating Factors  raising his right arm, sleeping, driving and using the right arm for controls, opening golf box in car, using right hand for washing hair and body   Pain Relieving Factors arm at side    Multiple Pain Sites No            [facility] PT Assessment - 08/31/16 0001      Assessment   Medical Diagnosis Right shoulder tendonitis   Referring Provider Dr. Elizabeth Palau   Onset Date/Surgical Date 01/13/16   Hand Dominance Right   Prior Therapy None to right shoulder     Precautions   Precautions Fall     Restrictions   Weight Bearing Restrictions No     Balance Screen   Has the patient fallen in the past 6 months No   Has the patient had a decrease in activity level because of a fear of falling?  No   Is the patient reluctant to leave their home because of a fear of falling?  No     Home Tourist information centre manager residence     Prior Function   Level of Independence Independent   Vocation Retired     IT consultant   Overall Cognitive Status Within Functional Limits for tasks assessed     Observation/Other Assessments   Focus on Therapeutic Outcomes (FOTO)  59% limitation  38% limitation     Posture/Postural Control   Posture/Postural Control No significant limitations     ROM / Strength   AROM / PROM / Strength AROM;PROM;Strength     AROM   AROM Assessment Site Shoulder   Right/Left Shoulder Right   Right Shoulder Extension 65 Degrees   Right Shoulder Flexion 40 Degrees   Right Shoulder ABduction 72 Degrees   Right Shoulder Internal Rotation 60 Degrees   Right Shoulder External Rotation 70 Degrees     PROM   PROM Assessment Site Shoulder   Right/Left Shoulder Right   Right Shoulder Flexion 115 Degrees   Right Shoulder ABduction 75 Degrees   Right Shoulder Internal Rotation 62 Degrees   Right Shoulder External Rotation 80 Degrees     Strength   Strength Assessment Site Shoulder   Right/Left Shoulder Right   Right Shoulder Flexion 2-/5   Right Shoulder Extension 4+/5   Right Shoulder ABduction 2/5   Right Shoulder Internal Rotation 2+/5   Right Shoulder External Rotation 2+/5     Palpation   Palpation comment palpable tenderness  located in right bicep tendon and RTC insertion                           PT Education - 08/31/16 1307    Education provided Yes   Education Details pendulums; shoulder flexion and abduction PROM on table   Person(s) Educated Patient   Methods Explanation;Demonstration;Verbal cues;Handout   Comprehension Returned demonstration;Verbalized understanding  PT Short Term Goals - 08/31/16 1318      PT SHORT TERM GOAL #1   Title be independent in initial HEP   Time 4   Period Weeks   Status New     PT SHORT TERM GOAL #2   Title right shoulder pain decreased >/= 25% due to improved mobility   Time 4   Period Weeks   Status New     PT SHORT TERM GOAL #3   Title right shoulder AROM for flexion >/= 90 degrees   Time 4   Period Weeks   Status New           PT Long Term Goals - 08/31/16 1320      PT LONG TERM GOAL #1   Title be independent in advanced HEP   Time 8   Period Weeks   Status New     PT LONG TERM GOAL #2   Title right shoulder strength >/= 3/5 so he is able to reach above his head to wash his hair   Time 8   Period Weeks   Status New     PT LONG TERM GOAL #3   Title right shoulder AROM is within functional ROM so he is able to dress himself with minimal to no difficulty   Time 8   Period Weeks   Status New     PT LONG TERM GOAL #4   Title sleep improved >/= 75% due to pain decreased   Time 8   Period Weeks   Status New     PT LONG TERM GOAL #5   Title FOTO score is </= 38% limitation   Time 8   Period Weeks   Status New     PT LONG TERM GOAL #6   Title ---               Plan - 08/31/16 1309    Clinical Impression Statement Patient is a 71 year old male that suddenly had right shoulder pain 01/2016 while doing shoulder exercises at [facility]. Patient has not exercised his right shoulder since then. Right shoulder constant pain is 2/10 and intermittent pain at level 9/10. Patient has increased pain with  right shoulder movement, sleeping, raising right arm overhead, dressing and getting out of bed.  Right shoulder strength is 2/5.  Right shoulder AROM and PROM is significantly reduced making it difficulty to perform activitie with his right shoulder. Palpable tenderness located on right bicep tendon and rotator cuff insertion. Patient is of low complexity evaluation due to an evoving condition and no comorbidities that will mpact his care.  Patient will benefit from skilled therapy to improve right shoulder ROM and strength while reducing pain.    Rehab Potential Good   Clinical Impairments Affecting Rehab Potential None   PT Frequency 2x / week   PT Duration 8 weeks   PT Treatment/Interventions Moist Heat;Cryotherapy;Ultrasound;Iontophoresis 4mg /ml Dexamethasone;Therapeutic activities;Therapeutic exercise;Neuromuscular re-education;Patient/family education;Passive range of motion;Manual techniques;Dry needling;Energy conservation;Taping   PT Next Visit Plan shoulder ROM exercises; modalities as needed; ionto if MD signs note, shoulder isometrics   PT Home Exercise Plan progress as needed   Recommended Other Services None   Consulted and Agree with Plan of Care Patient      Patient will benefit from skilled therapeutic intervention in order to improve the following deficits and impairments:  Decreased range of motion, Increased fascial restricitons, Decreased endurance, Increased muscle spasms, Pain, Decreased activity tolerance, Decreased mobility, Decreased strength  Visit  Diagnosis: Chronic right shoulder pain - Plan: PT plan of care cert/re-cert  Stiffness of right shoulder, not elsewhere classified - Plan: PT plan of care cert/re-cert  Muscle weakness (generalized) - Plan: PT plan of care cert/re-cert      G-Codes - 09/26/2016 1323    Functional Assessment Tool Used FOTO score is 59% limitation  38% limitation   Functional Limitation Carrying, moving and handling objects   Carrying,  Moving and Handling Objects Current Status (Z6109) At least 40 percent but less than 60 percent impaired, limited or restricted   Carrying, Moving and Handling Objects Goal Status (U0454) At least 20 percent but less than 40 percent impaired, limited or restricted       Problem List Patient Active Problem List   Diagnosis Date Noted  . History of DVT (deep vein thrombosis)   . Long-term (current) use of anticoagulants   . Old anterior myocardial infarction   . Sleep apnea   . Anemia of chronic disease 10/16/2015  . Chronic combined systolic and diastolic congestive heart failure (HCC) 09/25/2015  . Chronic respiratory failure (HCC) 09/17/2015  . 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 Ischemic cardiomyopathy   . CAD (coronary artery disease), native coronary artery   . Morbid obesity (HCC) 05/20/2015  . Benign prostatic hyperplasia with urinary obstruction 04/24/2015  . Chronic LBP 12/05/2013  . Essential hypertension 09/04/2013    Eulis Foster, PT 26-Sep-2016 1:26 PM   [facility] [address] [place], [place], [address] Phone: [phone]   Fax:  [phone]  Name: JACORION KLEM MRN: [id number] Date of Birth: [birth year]

## 2016-08-31 NOTE — Patient Instructions (Addendum)
Flexion (Passive)    Sitting upright, slide forearm forward along table, bending from the waist until a stretch is felt. Hold __1__ seconds. Repeat __10__ times. Do _3___ sessions per day.  Copyright  VHI. All rights reserved.     Abduction (Passive)    With arm out to side, resting on table, lower head toward arm, keeping trunk away from table. Hold __1__ seconds. Repeat __10__ times. Do __3__ sessions per day.  Copyright  VHI. All rights reserved.  ROM: Pendulum (Circular)    Let right arm move in circle clockwise, then counterclockwise, by rocking body weight in circular pattern. Circle __10__ times each direction per set. Do _1___ sets per session. Do __3__ sessions per day.  http://orth.exer.us/794   Copyright  VHI. All rights reserved.  [facility] [address], [address] [place], [place] [address] Phone # [phone] Fax [phone]

## 2016-09-05 ENCOUNTER — Encounter: Payer: Self-pay | Admitting: Physical Therapy

## 2016-09-05 ENCOUNTER — Ambulatory Visit: Payer: Medicare Other | Admitting: Physical Therapy

## 2016-09-05 DIAGNOSIS — M25511 Pain in right shoulder: Secondary | ICD-10-CM | POA: Diagnosis not present

## 2016-09-05 DIAGNOSIS — M25611 Stiffness of right shoulder, not elsewhere classified: Secondary | ICD-10-CM

## 2016-09-05 DIAGNOSIS — G8929 Other chronic pain: Secondary | ICD-10-CM

## 2016-09-05 DIAGNOSIS — M6281 Muscle weakness (generalized): Secondary | ICD-10-CM

## 2016-09-05 NOTE — Therapy (Signed)
[facility] [address], [address] [place], [place], [address] Phone: [phone]   Fax:  [phone]  Physical Therapy Treatment  Patient Details  Name: Jonathan Le MRN: [id number] Date of Birth: [birth year] Referring Provider: Dr. Elizabeth Palau  Encounter Date: 09/05/2016      PT End of Session - 09/05/16 1535    Visit Number 2   Number of Visits 10   Date for PT Re-Evaluation 10/26/16   Authorization Type Use KX modifier on all visits; g-code on 10th visit   Authorization - Visit Number 39   PT Start Time 1524   PT Stop Time 1620   PT Time Calculation (min) 56 min   Activity Tolerance Patient tolerated treatment well  Some pain   Behavior During Therapy [facility] for tasks assessed/performed      Past Medical History:  Diagnosis Date  . Adult situational stress disorder   . Anemia   . Anxiety   . Arthritis   . CHF (congestive heart failure) (HCC)    MI, with open heart surgery x 30 sdays ago per pt. (06/26/15)  . Chronic low back pain   . Decubital ulcer   . DVT (deep venous thrombosis) (HCC)   . ED (erectile dysfunction)   . ETD (eustachian tube dysfunction)   . Ganglion cyst   . GERD (gastroesophageal reflux disease)   . Glaucoma   . Hypertension   . Ileostomy in place [facility])   . MI (myocardial infarction)   . Osteoarthritis   . S/P CABG (coronary artery bypass graft)   . Sleep apnea    wears CPAP nightly    Past Surgical History:  Procedure Laterality Date  . CARDIAC CATHETERIZATION N/A 05/20/2015   Procedure: Left Heart Cath and Coronary Angiography;  Surgeon: Kathleene Hazel, MD;  Location: [facility] INVASIVE CV LAB;  Service: Cardiovascular;  Laterality: N/A;  . CORONARY ARTERY BYPASS GRAFT N/A 05/22/2015   Procedure: CORONARY ARTERY BYPASS GRAFTING (CABG), ON PUMP, TIMES THREE, USING LEFT INTERNAL MAMMARY ARTERY, RIGHT GREATER SAPHENOUS VEIN HARVESTED ENDOSCOPICALLY;  Surgeon: Kerin Perna, MD;   Location: [facility] OR;  Service: Open Heart Surgery;  Laterality: N/A;  LIMA-LAD, SVG-OM, SVG-PD  . EYE SURGERY     bil cataract  . FASCIOTOMY FOOT / TOE Left    foot  . HERNIA REPAIR     right  . LAPAROTOMY N/A 06/18/2015   Procedure: EXPLORATORY LAPAROTOMY;  Surgeon: Emelia Loron, MD;  Location: [facility] OR;  Service: General;  Laterality: N/A;  . LAPAROTOMY N/A 06/20/2015   Procedure: WASHOUT OF ABDOMEN, CLOSURE OF ABDOMINAL WALL WITH PLACEMENT OF WOUND VAC AND ILLEOSTOMY ;  Surgeon: Almond Lint, MD;  Location: [facility] OR;  Service: General;  Laterality: N/A;  . TEE WITHOUT CARDIOVERSION N/A 05/22/2015   Procedure: TRANSESOPHAGEAL ECHOCARDIOGRAM (TEE);  Surgeon: Kerin Perna, MD;  Location: [facility] OR;  Service: Open Heart Surgery;  Laterality: N/A;  . TRACHEOSTOMY TUBE PLACEMENT N/A 07/03/2015   Procedure: TRACHEOSTOMY;  Surgeon: Serena Colonel, MD;  Location: [facility] OR;  Service: ENT;  Laterality: N/A;  . WISDOM TOOTH EXTRACTION      There were no vitals filed for this visit.      Subjective Assessment - 09/05/16 1529    Subjective Sleeping is rough, especially laying on it. The weather today has not helped.    Currently in Pain? Yes   Pain Score 2    Pain Location Shoulder   Pain Orientation Right   Pain  Descriptors / Indicators Dull   Aggravating Factors  Using it   Pain Relieving Factors Rest   Multiple Pain Sites No                         [facility] Adult PT Treatment/Exercise - 09/05/16 0001      Knee/Hip Exercises: Aerobic   Nustep L1 x with focus on arm movements.     Shoulder Exercises: Seated   Other Seated Exercises Circles 10x 2  VC for compensations   Other Seated Exercises AAROM scaption 2x10 with PTA asst     Shoulder Exercises: Pulleys   Flexion 2 minutes   ABduction 2 minutes     Shoulder Exercises: Stretch   Table Stretch - Flexion --  2x10   Table Stretch - Abduction --  2x10     Ultrasound   Ultrasound Location RT bicep tendon and RTC insertion  laterally  Pt seated/assist needed to get arm out of shirt   Ultrasound Parameters 50% 1wtcm2 4 min per area   Ultrasound Goals Pain                  PT Short Term Goals - 08/31/16 1318      PT SHORT TERM GOAL #1   Title be independent in initial HEP   Time 4   Period Weeks   Status New     PT SHORT TERM GOAL #2   Title right shoulder pain decreased >/= 25% due to improved mobility   Time 4   Period Weeks   Status New     PT SHORT TERM GOAL #3   Title right shoulder AROM for flexion >/= 90 degrees   Time 4   Period Weeks   Status New           PT Long Term Goals - 08/31/16 1320      PT LONG TERM GOAL #1   Title be independent in advanced HEP   Time 8   Period Weeks   Status New     PT LONG TERM GOAL #2   Title right shoulder strength >/= 3/5 so he is able to reach above his head to wash his hair   Time 8   Period Weeks   Status New     PT LONG TERM GOAL #3   Title right shoulder AROM is within functional ROM so he is able to dress himself with minimal to no difficulty   Time 8   Period Weeks   Status New     PT LONG TERM GOAL #4   Title sleep improved >/= 75% due to pain decreased   Time 8   Period Weeks   Status New     PT LONG TERM GOAL #5   Title FOTO score is </= 38% limitation   Time 8   Period Weeks   Status New     PT LONG TERM GOAL #6   Title ---               Plan - 09/05/16 1535    Clinical Impression Statement Pt does very well with shoulder ROM exercises below 90 degrees, more challenging due to pain and tightness above 90. A good handul of times pt had audible grinding in his shoulder from about 90 degrees to 110 degrees. Ended session with ultrasound to help decrease pain. Ionto order is not back signed.    Rehab Potential Good   Clinical  Impairments Affecting Rehab Potential None   PT Frequency 2x / week   PT Duration 8 weeks   PT Treatment/Interventions Moist Heat;Cryotherapy;Ultrasound;Iontophoresis 4mg /ml  Dexamethasone;Therapeutic activities;Therapeutic exercise;Neuromuscular re-education;Patient/family education;Passive range of motion;Manual techniques;Dry needling;Energy conservation;Taping   PT Next Visit Plan shoulder ROM exercises; modalities as needed; ionto if MD signs note, shoulder isometrics   Consulted and Agree with Plan of Care Patient      Patient will benefit from skilled therapeutic intervention in order to improve the following deficits and impairments:  Decreased range of motion, Increased fascial restricitons, Decreased endurance, Increased muscle spasms, Pain, Decreased activity tolerance, Decreased mobility, Decreased strength  Visit Diagnosis: Chronic right shoulder pain  Stiffness of right shoulder, not elsewhere classified  Muscle weakness (generalized)     Problem List Patient Active Problem List   Diagnosis Date Noted  . History of DVT (deep vein thrombosis)   . Long-term (current) use of anticoagulants   . Old anterior myocardial infarction   . Sleep apnea   . Anemia of chronic disease 10/16/2015  . Chronic combined systolic and diastolic congestive heart failure (HCC) 09/25/2015  . Chronic respiratory failure (HCC) 09/17/2015  . 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 Ischemic cardiomyopathy   . CAD (coronary artery disease), native coronary artery   . Morbid obesity (HCC) 05/20/2015  . Benign prostatic hyperplasia with urinary obstruction 04/24/2015  . Chronic LBP 12/05/2013  . Essential hypertension 09/04/2013    Aundray Cartlidge, PTA 09/05/2016, 4:11 PM   [facility] [address], [address] [place], [place], [address] Phone: [phone]   Fax:  [phone]  Name: [name] MRN: [id number] Date of Birth: 01/17/1946

## 2016-09-07 ENCOUNTER — Encounter: Payer: Medicare Other | Admitting: Physical Therapy

## 2016-09-12 ENCOUNTER — Ambulatory Visit: Payer: Medicare Other | Admitting: Physical Therapy

## 2016-09-12 ENCOUNTER — Encounter: Payer: Self-pay | Admitting: Physical Therapy

## 2016-09-12 DIAGNOSIS — M25511 Pain in right shoulder: Secondary | ICD-10-CM | POA: Diagnosis not present

## 2016-09-12 DIAGNOSIS — G8929 Other chronic pain: Secondary | ICD-10-CM

## 2016-09-12 DIAGNOSIS — M6281 Muscle weakness (generalized): Secondary | ICD-10-CM

## 2016-09-12 DIAGNOSIS — M25611 Stiffness of right shoulder, not elsewhere classified: Secondary | ICD-10-CM

## 2016-09-12 NOTE — Patient Instructions (Signed)

## 2016-09-12 NOTE — Therapy (Signed)
[facility] [address], [address] [place], [place], [address] Phone: [phone]   Fax:  [phone]  Physical Therapy Treatment  Patient Details  Name: Jonathan HarpsHarold D Mcburney MRN: [id number] Date of Birth: 08/08/1946 Referring Provider: Dr. Elizabeth Palaueresa Anderson  Encounter Date: 09/12/2016      PT End of Session - 09/12/16 1237    Visit Number 3   Number of Visits 10   Date for PT Re-Evaluation 10/26/16   Authorization Type Use KX modifier on all visits; g-code on 10th visit   Authorization - Visit Number 39   PT Start Time 1230   PT Stop Time 1318   PT Time Calculation (min) 48 min   Activity Tolerance Patient tolerated treatment well   Behavior [facility] for tasks assessed/performed      Past Medical History:  Diagnosis Date  . Adult situational stress disorder   . Anemia   . Anxiety   . Arthritis   . CHF (congestive heart failure) (HCC)    MI, with open heart surgery x 30 sdays ago per pt. (06/26/15)  . Chronic low back pain   . Decubital ulcer   . DVT (deep venous thrombosis) (HCC)   . ED (erectile dysfunction)   . ETD (eustachian tube dysfunction)   . Ganglion cyst   . GERD (gastroesophageal reflux disease)   . Glaucoma   . Hypertension   . Ileostomy in place [facility](HCC)   . MI (myocardial infarction)   . Osteoarthritis   . S/P CABG (coronary artery bypass graft)   . Sleep apnea    wears CPAP nightly    Past Surgical History:  Procedure Laterality Date  . CARDIAC CATHETERIZATION N/A 05/20/2015   Procedure: Left Heart Cath and Coronary Angiography;  Surgeon: Kathleene Hazelhristopher D McAlhany, MD;  [facility];  Service: Cardiovascular;  Laterality: N/A;  . CORONARY ARTERY BYPASS GRAFT N/A 05/22/2015   Procedure: CORONARY ARTERY BYPASS GRAFTING (CABG), ON PUMP, TIMES THREE, USING LEFT INTERNAL MAMMARY ARTERY, RIGHT GREATER SAPHENOUS VEIN HARVESTED ENDOSCOPICALLY;  Surgeon: Kerin PernaPeter Van Trigt, MD;  [facility];  Service: Open Heart Surgery;  Laterality: N/A;  LIMA-LAD, SVG-OM, SVG-PD  . EYE SURGERY     bil cataract  . FASCIOTOMY FOOT / TOE Left    foot  . HERNIA REPAIR     right  . LAPAROTOMY N/A 06/18/2015   Procedure: EXPLORATORY LAPAROTOMY;  Surgeon: Emelia LoronMatthew Wakefield, MD;  [facility];  Service: General;  Laterality: N/A;  . LAPAROTOMY N/A 06/20/2015   Procedure: WASHOUT OF ABDOMEN, CLOSURE OF ABDOMINAL WALL WITH PLACEMENT OF WOUND VAC AND ILLEOSTOMY ;  Surgeon: Almond LintFaera Byerly, MD;  Location: [facility] OR;  Service: General;  Laterality: N/A;  . TEE WITHOUT CARDIOVERSION N/A 05/22/2015   Procedure: TRANSESOPHAGEAL ECHOCARDIOGRAM (TEE);  Surgeon: Kerin PernaPeter Van Trigt, MD;  [facility];  Service: Open Heart Surgery;  Laterality: N/A;  . TRACHEOSTOMY TUBE PLACEMENT N/A 07/03/2015   Procedure: TRACHEOSTOMY;  Surgeon: Serena ColonelJefry Rosen, MD;  [facility];  Service: ENT;  Laterality: N/A;  . WISDOM TOOTH EXTRACTION      There were no vitals filed for this visit.      Subjective Assessment - 09/12/16 1232    Subjective Shoulder hurts during sleep.    Pertinent History Heart attack: 05/17/15 with CAGB , In and out of hospital and rehab over the past 215 days due to medial complications.  Recent onset of Rt shoulder pain   Patient Stated  Goals sleeping, raising right arm overhead,    Currently in Pain? Yes   Pain Score 5    Pain Location Shoulder   Pain Orientation Right   Pain Descriptors / Indicators Dull   Pain Type Chronic pain                         [facility] Adult PT Treatment/Exercise - 09/12/16 0001      Knee/Hip Exercises: Aerobic   Nustep L1 x with focus on arm movements.     Shoulder Exercises: Seated   Other Seated Exercises Circles 10x 2  VC for compensations   Other Seated Exercises AAROM scaption 2x10 with PTA asst     Shoulder Exercises: Pulleys   Flexion 2 minutes   ABduction 2 minutes     Shoulder Exercises: Stretch   Table Stretch - Flexion --  2x10    Table Stretch - Abduction --  2x10     Modalities   Modalities Iontophoresis     Ultrasound   Ultrasound Location Rt biceps tendon and RTC insretion laterally   Ultrasound Parameters 50% 1wtcm2 4 min per area   Ultrasound Goals Pain     Iontophoresis   Type of Iontophoresis Dexamethasone   Location Rt biceps tendon insertion   Dose 1 mL   Time 6 hour patch                PT Education - 09/12/16 1319    Education provided Yes   Education Details ionoto   Person(s) Educated Patient   Methods Explanation;Handout   Comprehension Verbalized understanding          PT Short Term Goals - 09/12/16 1241      PT SHORT TERM GOAL #1   Title be independent in initial HEP   Time 4   Period Weeks   Status On-going     PT SHORT TERM GOAL #2   Title right shoulder pain decreased >/= 25% due to improved mobility   Time 4   Period Weeks   Status On-going     PT SHORT TERM GOAL #3   Title right shoulder AROM for flexion >/= 90 degrees   Time 4   Period Weeks   Status On-going     PT SHORT TERM GOAL #4   Title walk for 10 minutes without significant fatigue   Time 4   Period Weeks   Status On-going           PT Long Term Goals - 09/12/16 1241      PT LONG TERM GOAL #1   Title be independent in advanced HEP   Time 8   Period Weeks   Status On-going     PT LONG TERM GOAL #2   Title right shoulder strength >/= 3/5 so he is able to reach above his head to wash his hair   Time 8   Period Weeks   Status On-going     PT LONG TERM GOAL #3   Title right shoulder AROM is within functional ROM so he is able to dress himself with minimal to no difficulty   Time 8   Period Weeks   Status On-going     PT LONG TERM GOAL #4   Title sleep improved >/= 75% due to pain decreased   Time 8   Period Weeks   Status On-going     PT LONG TERM GOAL #5   Title FOTO score  is </= 38% limitation   Time 8   Period Weeks   Status On-going     PT LONG TERM GOAL #6    Time 2   Period Weeks   Status On-going               Plan - 09/12/16 1256    Clinical Impression Statement Pt able to complete all exercises. Difficulty with AA/ROM in shoulder scaption due to weakness and pain. Pt having some pain in Rt shoulder blade as well. Pt will continue to benefit from skilled therapy for shoulder ROM and strength.    Rehab Potential Good   Clinical Impairments Affecting Rehab Potential None   PT Frequency 2x / week   PT Duration 8 weeks   PT Treatment/Interventions Moist Heat;Cryotherapy;Ultrasound;Iontophoresis 4mg /ml Dexamethasone;Therapeutic activities;Therapeutic exercise;Neuromuscular re-education;Patient/family education;Passive range of motion;Manual techniques;Dry needling;Energy conservation;Taping   PT Next Visit Plan asses responce to ionoto #1, shoulder ROM, isometrics, modalities as needed   Consulted and Agree with Plan of Care Patient      Patient will benefit from skilled therapeutic intervention in order to improve the following deficits and impairments:  Decreased range of motion, Increased fascial restricitons, Decreased endurance, Increased muscle spasms, Pain, Decreased activity tolerance, Decreased mobility, Decreased strength  Visit Diagnosis: Chronic right shoulder pain  Stiffness of right shoulder, not elsewhere classified  Muscle weakness (generalized)     Problem List Patient Active Problem List   Diagnosis Date Noted  . History of DVT (deep vein thrombosis)   . Long-term (current) use of anticoagulants   . Old anterior myocardial infarction   . Sleep apnea   . Anemia of chronic disease 10/16/2015  . Chronic combined systolic and diastolic congestive heart failure (HCC) 09/25/2015  . Chronic respiratory failure (HCC) 09/17/2015  . 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 Ischemic cardiomyopathy   . CAD (coronary artery disease), native coronary artery   . Morbid obesity (HCC) 05/20/2015  . Benign prostatic hyperplasia with  urinary obstruction 04/24/2015  . Chronic LBP 12/05/2013  . Essential hypertension 09/04/2013    Dessa PhiKatherine Matthews PTA 09/12/2016, 1:50 PM  [facility] [address], [address] [place], [place], [address] Phone: [phone]   Fax:  [phone]  Name: Jonathan HarpsHarold D Critzer MRN: [id number] Date of Birth: 08/08/1946

## 2016-09-13 ENCOUNTER — Ambulatory Visit: Payer: Medicare Other | Admitting: Physical Therapy

## 2016-09-14 ENCOUNTER — Encounter: Payer: Self-pay | Admitting: Physical Therapy

## 2016-09-14 ENCOUNTER — Ambulatory Visit: Payer: Medicare Other | Attending: Nurse Practitioner | Admitting: Physical Therapy

## 2016-09-14 DIAGNOSIS — G8929 Other chronic pain: Secondary | ICD-10-CM | POA: Insufficient documentation

## 2016-09-14 DIAGNOSIS — M25511 Pain in right shoulder: Secondary | ICD-10-CM | POA: Diagnosis present

## 2016-09-14 DIAGNOSIS — M25611 Stiffness of right shoulder, not elsewhere classified: Secondary | ICD-10-CM | POA: Insufficient documentation

## 2016-09-14 DIAGNOSIS — M6281 Muscle weakness (generalized): Secondary | ICD-10-CM | POA: Insufficient documentation

## 2016-09-14 NOTE — Therapy (Signed)
[facility] [address] [place], [place], [address] Phone: [phone]   Fax:  [phone]  Physical Therapy Treatment  Patient Details  Name: Jonathan TUPY MRN: [id number] Date of Birth: 04/20/1946 Referring Provider: Dr. Elizabeth Palau  Encounter Date: 09/14/2016      PT End of Session - 09/14/16 1446    Visit Number 4   Number of Visits 10   Date for PT Re-Evaluation 10/26/16   Authorization Type Use KX modifier on all visits; g-code on 10th visit   PT Start Time 1445   PT Stop Time 1550   PT Time Calculation (min) 65 min   Activity Tolerance Patient tolerated treatment well   Behavior During Therapy [facility] for tasks assessed/performed      Past Medical History:  Diagnosis Date  . Adult situational stress disorder   . Anemia   . Anxiety   . Arthritis   . CHF (congestive heart failure) (HCC)    MI, with open heart surgery x 30 sdays ago per pt. (06/26/15)  . Chronic low back pain   . Decubital ulcer   . DVT (deep venous thrombosis) (HCC)   . ED (erectile dysfunction)   . ETD (eustachian tube dysfunction)   . Ganglion cyst   . GERD (gastroesophageal reflux disease)   . Glaucoma   . Hypertension   . Ileostomy in place [facility])   . MI (myocardial infarction)   . Osteoarthritis   . S/P CABG (coronary artery bypass graft)   . Sleep apnea    wears CPAP nightly    Past Surgical History:  Procedure Laterality Date  . CARDIAC CATHETERIZATION N/A 05/20/2015   Procedure: Left Heart Cath and Coronary Angiography;  Surgeon: Kathleene Hazel, MD;  [facility];  Service: Cardiovascular;  Laterality: N/A;  . CORONARY ARTERY BYPASS GRAFT N/A 05/22/2015   Procedure: CORONARY ARTERY BYPASS GRAFTING (CABG), ON PUMP, TIMES THREE, USING LEFT INTERNAL MAMMARY ARTERY, RIGHT GREATER SAPHENOUS VEIN HARVESTED ENDOSCOPICALLY;  Surgeon: Kerin Perna, MD;  [facility];  Service: Open Heart Surgery;   Laterality: N/A;  LIMA-LAD, SVG-OM, SVG-PD  . EYE SURGERY     bil cataract  . FASCIOTOMY FOOT / TOE Left    foot  . HERNIA REPAIR     right  . LAPAROTOMY N/A 06/18/2015   Procedure: EXPLORATORY LAPAROTOMY;  Surgeon: Emelia Loron, MD;  Location: [facility];  Service: General;  Laterality: N/A;  . LAPAROTOMY N/A 06/20/2015   Procedure: WASHOUT OF ABDOMEN, CLOSURE OF ABDOMINAL WALL WITH PLACEMENT OF WOUND VAC AND ILLEOSTOMY ;  Surgeon: Almond Lint, MD;  Location: [facility] OR;  Service: General;  Laterality: N/A;  . TEE WITHOUT CARDIOVERSION N/A 05/22/2015   Procedure: TRANSESOPHAGEAL ECHOCARDIOGRAM (TEE);  Surgeon: Kerin Perna, MD;  [facility] OR;  Service: Open Heart Surgery;  Laterality: N/A;  . TRACHEOSTOMY TUBE PLACEMENT N/A 07/03/2015   Procedure: TRACHEOSTOMY;  Surgeon: Serena Colonel, MD;  Location[facility];  Service: ENT;  Laterality: N/A;  . WISDOM TOOTH EXTRACTION      There were no vitals filed for this visit.      Subjective Assessment - 09/14/16 1448    Subjective Sleeping still is hard, did not feel much benefit from first ionto patch, whole body feels stiff.    Currently in Pain? Yes   Pain Score 6    Pain Location Shoulder   Pain Orientation Right   Pain Descriptors / Indicators Sore  Aggravating Factors  Using it   Pain Relieving Factors rest   Multiple Pain Sites No            [facility] PT Assessment - 09/14/16 0001      AROM   Right Shoulder Flexion 80 Degrees  in scapular plane, could not lift in sagittal plane                     [facility] Adult PT Treatment/Exercise - 09/14/16 0001      Knee/Hip Exercises: Aerobic   Nustep L2 x 10 min     Shoulder Exercises: Supine   Flexion AROM;Both;10 reps  Like a pushup 10x with cane. added to HEP     Shoulder Exercises: Seated   Flexion AAROM;Right  2x10 with PTA asst     Shoulder Exercises: Standing   Flexion --  Finger ladder: 5x to #20     Shoulder Exercises: Pulleys   Flexion 2 minutes   ABduction  2 minutes  More scaption     Ultrasound   Ultrasound Location Rt biceps tendon and lateral RTC insertion   Ultrasound Parameters 50% 1wtcm2 8 min1 MZ   Ultrasound Goals Pain     Iontophoresis   Type of Iontophoresis Dexamethasone   Location Rt biceps tendon insertion   Dose 1 ml   Time 6 hour patch  #2, skin intact                  PT Short Term Goals - 09/14/16 1459      PT SHORT TERM GOAL #1   Title be independent in initial HEP   Time 4   Period Weeks   Status Achieved           PT Long Term Goals - 09/12/16 1241      PT LONG TERM GOAL #1   Title be independent in advanced HEP   Time 8   Period Weeks   Status On-going     PT LONG TERM GOAL #2   Title right shoulder strength >/= 3/5 so he is able to reach above his head to wash his hair   Time 8   Period Weeks   Status On-going     PT LONG TERM GOAL #3   Title right shoulder AROM is within functional ROM so he is able to dress himself with minimal to no difficulty   Time 8   Period Weeks   Status On-going     PT LONG TERM GOAL #4   Title sleep improved >/= 75% due to pain decreased   Time 8   Period Weeks   Status On-going     PT LONG TERM GOAL #5   Title FOTO score is </= 38% limitation   Time 8   Period Weeks   Status On-going     PT LONG TERM GOAL #6   Time 2   Period Weeks   Status On-going               Plan - 09/14/16 1447    Clinical Impression Statement Pain is essentially no better at htis point although pt is still early on in his treatment. Pt's shoulder grinds significantly from 100 degrees to 115 degrees-ish. This grinding is also painful but will work through it. He did measure better in "flexion" today better than on eval day, but pt moves best in the scapular plane. Sagittal plane is very painful with the grind/clunking.. Verbally educated pt to  try a pillow under his arm at night to see if this feels better supporting it.    Rehab Potential Good   Clinical  Impairments Affecting Rehab Potential None   PT Frequency 2x / week   PT Duration 8 weeks   PT Treatment/Interventions Moist Heat;Cryotherapy;Ultrasound;Iontophoresis 4mg /ml Dexamethasone;Therapeutic activities;Therapeutic exercise;Neuromuscular re-education;Patient/family education;Passive range of motion;Manual techniques;Dry needling;Energy conservation;Taping   PT Next Visit Plan Ionto #3, AAROM Rt shoulder in supine, try some supine scapular isometrics or just shoulder isometrics in sitting next.    Consulted and Agree with Plan of Care Patient      Patient will benefit from skilled therapeutic intervention in order to improve the following deficits and impairments:  Decreased range of motion, Increased fascial restricitons, Decreased endurance, Increased muscle spasms, Pain, Decreased activity tolerance, Decreased mobility, Decreased strength  Visit Diagnosis: Stiffness of right shoulder, not elsewhere classified  Muscle weakness (generalized)  Chronic right shoulder pain     Problem List Patient Active Problem List   Diagnosis Date Noted  . History of DVT (deep vein thrombosis)   . Long-term (current) use of anticoagulants   . Old anterior myocardial infarction   . Sleep apnea   . Anemia of chronic disease 10/16/2015  . Chronic combined systolic and diastolic congestive heart failure (HCC) 09/25/2015  . Chronic respiratory failure (HCC) 09/17/2015  . 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 Ischemic cardiomyopathy   . CAD (coronary artery disease), native coronary artery   . Morbid obesity (HCC) 05/20/2015  . Benign prostatic hyperplasia with urinary obstruction 04/24/2015  . Chronic LBP 12/05/2013  . Essential hypertension 09/04/2013    Edel Rivero, PTA 09/14/2016, 5:04 PM  [facility] [address], [address] [place], [place], [address] Phone: [phone]   Fax:  [phone]  Name: Ardyth HarpsHarold D Polak MRN: [id number] Date  of Birth: 02/25/1946

## 2016-09-19 ENCOUNTER — Ambulatory Visit: Payer: Medicare Other | Admitting: Physical Therapy

## 2016-09-21 ENCOUNTER — Encounter: Payer: Medicare Other | Admitting: Physical Therapy

## 2016-09-26 ENCOUNTER — Ambulatory Visit: Payer: Medicare Other | Admitting: Physical Therapy

## 2016-09-26 ENCOUNTER — Encounter: Payer: Self-pay | Admitting: Physical Therapy

## 2016-09-26 DIAGNOSIS — M6281 Muscle weakness (generalized): Secondary | ICD-10-CM

## 2016-09-26 DIAGNOSIS — M25511 Pain in right shoulder: Secondary | ICD-10-CM

## 2016-09-26 DIAGNOSIS — M25611 Stiffness of right shoulder, not elsewhere classified: Secondary | ICD-10-CM

## 2016-09-26 DIAGNOSIS — G8929 Other chronic pain: Secondary | ICD-10-CM

## 2016-09-26 NOTE — Therapy (Signed)
[facility] [address] [place], [place], [address] Phone: [phone]   Fax:  [phone]  Physical Therapy Treatment  Patient Details  Name: Jonathan Le MRN: [id number] Date of Birth: Apr 07, 1946 Referring Provider: Dr. Elizabeth Palau  Encounter Date: 09/26/2016      PT End of Session - 09/26/16 1409    Visit Number 5   Number of Visits 10   Date for PT Re-Evaluation 10/26/16   Authorization Type Use KX modifier on all visits; g-code on 10th visit   PT Start Time 1400   PT Stop Time 1457   PT Time Calculation (min) 57 min   Activity Tolerance Patient tolerated treatment well   Behavior During Therapy [facility] for tasks assessed/performed      Past Medical History:  Diagnosis Date  . Adult situational stress disorder   . Anemia   . Anxiety   . Arthritis   . CHF (congestive heart failure) (HCC)    MI, with open heart surgery x 30 sdays ago per pt. (06/26/15)  . Chronic low back pain   . Decubital ulcer   . DVT (deep venous thrombosis) (HCC)   . ED (erectile dysfunction)   . ETD (eustachian tube dysfunction)   . Ganglion cyst   . GERD (gastroesophageal reflux disease)   . Glaucoma   . Hypertension   . Ileostomy in place [facility])   . MI (myocardial infarction)   . Osteoarthritis   . S/P CABG (coronary artery bypass graft)   . Sleep apnea    wears CPAP nightly    Past Surgical History:  Procedure Laterality Date  . CARDIAC CATHETERIZATION N/A 05/20/2015   Procedure: Left Heart Cath and Coronary Angiography;  Surgeon: Kathleene Hazel, MD;  [facility];  Service: Cardiovascular;  Laterality: N/A;  . CORONARY ARTERY BYPASS GRAFT N/A 05/22/2015   Procedure: CORONARY ARTERY BYPASS GRAFTING (CABG), ON PUMP, TIMES THREE, USING LEFT INTERNAL MAMMARY ARTERY, RIGHT GREATER SAPHENOUS VEIN HARVESTED ENDOSCOPICALLY;  Surgeon: Kerin Perna, MD;  Location[facility] OR;  Service: Open Heart Surgery;   Laterality: N/A;  LIMA-LAD, SVG-OM, SVG-PD  . EYE SURGERY     bil cataract  . FASCIOTOMY FOOT / TOE Left    foot  . HERNIA REPAIR     right  . LAPAROTOMY N/A 06/18/2015   Procedure: EXPLORATORY LAPAROTOMY;  Surgeon: Emelia Loron, MD;  Location: [facility] OR;  Service: General;  Laterality: N/A;  . LAPAROTOMY N/A 06/20/2015   Procedure: WASHOUT OF ABDOMEN, CLOSURE OF ABDOMINAL WALL WITH PLACEMENT OF WOUND VAC AND ILLEOSTOMY ;  Surgeon: Almond Lint, MD;  Location: [facility] OR;  Service: General;  Laterality: N/A;  . TEE WITHOUT CARDIOVERSION N/A 05/22/2015   Procedure: TRANSESOPHAGEAL ECHOCARDIOGRAM (TEE);  Surgeon: Kerin Perna, MD;  Location: [facility] OR;  Service: Open Heart Surgery;  Laterality: N/A;  . TRACHEOSTOMY TUBE PLACEMENT N/A 07/03/2015   Procedure: TRACHEOSTOMY;  Surgeon: Serena Colonel, MD;  Location: [facility] OR;  Service: ENT;  Laterality: N/A;  . WISDOM TOOTH EXTRACTION      There were no vitals filed for this visit.      Subjective Assessment - 09/26/16 1407    Subjective My shoulder is about the same, even the knees are bothering me today. Sleeping still problematic.   Currently in Pain? Yes   Pain Score --  RT shoulder 6/10 at rest,  RT knee 6/10 with weightbearing   Pain Orientation Right   Pain Descriptors /  Indicators Aching;Sore   Aggravating Factors  Use   Pain Relieving Factors NWB            [facility] PT Assessment - 09/26/16 0001      AROM   Right Shoulder Flexion --  In scapular plane 95 degrees: could not do actively      PROM   Right Shoulder Flexion 120 Degrees                     [facility] Adult PT Treatment/Exercise - 09/26/16 0001      Knee/Hip Exercises: Aerobic   Nustep L2 x 15 min  Had to stop use of RT arm at 10 min mark     Shoulder Exercises: Supine   Flexion AAROM;Both  30x pushups elbow flexed, then elbows straight overhead 30x     Shoulder Exercises: Pulleys   Flexion 3 minutes   ABduction 3 minutes     Iontophoresis   Type of  Iontophoresis Dexamethasone   Location Rt biceps tendon insertion   Dose 1 ml   Time 6 hour patch  #3, skin intact                  PT Short Term Goals - 09/26/16 1410      PT SHORT TERM GOAL #2   Title right shoulder pain decreased >/= 25% due to improved mobility   Time 4   Period Weeks     PT SHORT TERM GOAL #4   Title walk for 10 minutes without significant fatigue   Time 4   Period Weeks   Status Achieved  Fatigue min-mod           PT Long Term Goals - 09/12/16 1241      PT LONG TERM GOAL #1   Title be independent in advanced HEP   Time 8   Period Weeks   Status On-going     PT LONG TERM GOAL #2   Title right shoulder strength >/= 3/5 so he is able to reach above his head to wash his hair   Time 8   Period Weeks   Status On-going     PT LONG TERM GOAL #3   Title right shoulder AROM is within functional ROM so he is able to dress himself with minimal to no difficulty   Time 8   Period Weeks   Status On-going     PT LONG TERM GOAL #4   Title sleep improved >/= 75% due to pain decreased   Time 8   Period Weeks   Status On-going     PT LONG TERM GOAL #5   Title FOTO score is </= 38% limitation   Time 8   Period Weeks   Status On-going     PT LONG TERM GOAL #6   Time 2   Period Weeks   Status On-going               Plan - 09/26/16 1410    Clinical Impression Statement Pt had to cancel the last few appts due to some personal issues. His pain is essentially the same with use, and only does his initial HEP, no supine cane exercises.  Pt has not tried sleeping with pillow/support under arm yet. He is walking at least 100 feet multiple times a day with a combination of walker outdoors and cane indoors.  Pt could not independently lift his arm in the sagittal plane with significant scaption. In scapular plane  pt can lift actively to 95 degrees today.    Rehab Potential Good   Clinical Impairments Affecting Rehab Potential None   PT  Frequency 2x / week   PT Duration 8 weeks   PT Treatment/Interventions Moist Heat;Cryotherapy;Ultrasound;Iontophoresis 4mg /ml Dexamethasone;Therapeutic activities;Therapeutic exercise;Neuromuscular re-education;Patient/family education;Passive range of motion;Manual techniques;Dry needling;Energy conservation;Taping   PT Next Visit Plan Possible ionto #4, Rt shoulder AA/AROM add isometrics to HEP next   Consulted and Agree with Plan of Care Patient      Patient will benefit from skilled therapeutic intervention in order to improve the following deficits and impairments:  Decreased range of motion, Increased fascial restricitons, Decreased endurance, Increased muscle spasms, Pain, Decreased activity tolerance, Decreased mobility, Decreased strength  Visit Diagnosis: Stiffness of right shoulder, not elsewhere classified  Muscle weakness (generalized)  Chronic right shoulder pain     Problem List Patient Active Problem List   Diagnosis Date Noted  . History of DVT (deep vein thrombosis)   . Long-term (current) use of anticoagulants   . Old anterior myocardial infarction   . Sleep apnea   . Anemia of chronic disease 10/16/2015  . Chronic combined systolic and diastolic congestive heart failure (HCC) 09/25/2015  . Chronic respiratory failure (HCC) 09/17/2015  . 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 Ischemic cardiomyopathy   . CAD (coronary artery disease), native coronary artery   . Morbid obesity (HCC) 05/20/2015  . Benign prostatic hyperplasia with urinary obstruction 04/24/2015  . Chronic LBP 12/05/2013  . Essential hypertension 09/04/2013    Morgan Keinath, PTA 09/26/2016, 2:55 PM  [facility] [address] [place], [place], [address] Phone: [phone]   Fax:  [phone]  Name: [name] MRN: [id number] Date of Birth: 03/06/1946

## 2016-09-28 ENCOUNTER — Ambulatory Visit: Payer: Medicare Other | Admitting: Physical Therapy

## 2016-09-28 ENCOUNTER — Encounter: Payer: Self-pay | Admitting: Physical Therapy

## 2016-09-28 DIAGNOSIS — G8929 Other chronic pain: Secondary | ICD-10-CM

## 2016-09-28 DIAGNOSIS — M25611 Stiffness of right shoulder, not elsewhere classified: Secondary | ICD-10-CM

## 2016-09-28 DIAGNOSIS — M25511 Pain in right shoulder: Secondary | ICD-10-CM

## 2016-09-28 DIAGNOSIS — M6281 Muscle weakness (generalized): Secondary | ICD-10-CM

## 2016-09-28 NOTE — Therapy (Signed)
[facility] [address], [address] [place], [place], [address] Phone: [phone]   Fax:  [phone]  Physical Therapy Treatment  Patient Details  Name: Jonathan Le MRN: [id number] Date of Birth: 04/21/1946 Referring Provider: Dr. Elizabeth Palaueresa Anderson  Encounter Date: 09/28/2016      PT End of Session - 09/28/16 1406    Visit Number 6   Number of Visits 10   Date for PT Re-Evaluation 10/26/16   Authorization Type Use KX modifier on all visits; g-code on 10th visit   PT Start Time 1405   PT Stop Time 1508   PT Time Calculation (min) 63 min   Activity Tolerance Patient tolerated treatment well   Behavior During Therapy [facility] for tasks assessed/performed      Past Medical History:  Diagnosis Date  . Adult situational stress disorder   . Anemia   . Anxiety   . Arthritis   . CHF (congestive heart failure) (HCC)    MI, with open heart surgery x 30 sdays ago per pt. (06/26/15)  . Chronic low back pain   . Decubital ulcer   . DVT (deep venous thrombosis) (HCC)   . ED (erectile dysfunction)   . ETD (eustachian tube dysfunction)   . Ganglion cyst   . GERD (gastroesophageal reflux disease)   . Glaucoma   . Hypertension   . Ileostomy in place [facility](HCC)   . MI (myocardial infarction)   . Osteoarthritis   . S/P CABG (coronary artery bypass graft)   . Sleep apnea    wears CPAP nightly    Past Surgical History:  Procedure Laterality Date  . CARDIAC CATHETERIZATION N/A 05/20/2015   Procedure: Left Heart Cath and Coronary Angiography;  Surgeon: Kathleene Hazelhristopher D McAlhany, MD;  [facility];  Service: Cardiovascular;  Laterality: N/A;  . CORONARY ARTERY BYPASS GRAFT N/A 05/22/2015   Procedure: CORONARY ARTERY BYPASS GRAFTING (CABG), ON PUMP, TIMES THREE, USING LEFT INTERNAL MAMMARY ARTERY, RIGHT GREATER SAPHENOUS VEIN HARVESTED ENDOSCOPICALLY;  Surgeon: Kerin PernaPeter Van Trigt, MD;  [facility];  Service: Open Heart Surgery;   Laterality: N/A;  LIMA-LAD, SVG-OM, SVG-PD  . EYE SURGERY     bil cataract  . FASCIOTOMY FOOT / TOE Left    foot  . HERNIA REPAIR     right  . LAPAROTOMY N/A 06/18/2015   Procedure: EXPLORATORY LAPAROTOMY;  Surgeon: Emelia LoronMatthew Wakefield, MD;  [facility];  Service: General;  Laterality: N/A;  . LAPAROTOMY N/A 06/20/2015   Procedure: WASHOUT OF ABDOMEN, CLOSURE OF ABDOMINAL WALL WITH PLACEMENT OF WOUND VAC AND ILLEOSTOMY ;  Surgeon: Almond LintFaera Byerly, MD;  Location: [facility] OR;  Service: General;  Laterality: N/A;  . TEE WITHOUT CARDIOVERSION N/A 05/22/2015   Procedure: TRANSESOPHAGEAL ECHOCARDIOGRAM (TEE);  Surgeon: Kerin PernaPeter Van Trigt, MD;  [facility];  Service: Open Heart Surgery;  Laterality: N/A;  . TRACHEOSTOMY TUBE PLACEMENT N/A 07/03/2015   Procedure: TRACHEOSTOMY;  Surgeon: Serena ColonelJefry Rosen, MD;  [facility];  Service: ENT;  Laterality: N/A;  . WISDOM TOOTH EXTRACTION      There were no vitals filed for this visit.      Subjective Assessment - 09/28/16 1407    Subjective My shoulder doesn't feel terrible today, had some LBP last night that was pretty intense.    Currently in Pain? Yes   Pain Score 4    Pain Location Shoulder   Pain Orientation Right   Pain Descriptors / Indicators Dull   Multiple Pain  Sites No                         [facility] Adult PT Treatment/Exercise - 09/28/16 0001      Knee/Hip Exercises: Aerobic   Nustep L2 x 15 min  Had to stop use of RT arm at 10 min mark     Shoulder Exercises: Supine   Horizontal ABduction AAROM;Both;10 reps   Flexion AAROM;Both  30x pushups elbow flexed, then elbows straight overhead 30x     Shoulder Exercises: Pulleys   Flexion 3 minutes   ABduction 3 minutes     Iontophoresis   Type of Iontophoresis Dexamethasone   Location Rt biceps tendon insertion   Dose 1 ml   Time 6 hour patch  #3, skin intact                PT Education - 09/28/16 1428    Education provided Yes   Education Details Supine  yellow band exercises for HEP   Person(s) Educated Patient;Spouse   Methods Explanation;Demonstration;Tactile cues;Verbal cues   Comprehension Verbalized understanding;Returned demonstration          PT Short Term Goals - 09/26/16 1410      PT SHORT TERM GOAL #2   Title right shoulder pain decreased >/= 25% due to improved mobility   Time 4   Period Weeks     PT SHORT TERM GOAL #4   Title walk for 10 minutes without significant fatigue   Time 4   Period Weeks   Status Achieved  Fatigue min-mod           PT Long Term Goals - 09/12/16 1241      PT LONG TERM GOAL #1   Title be independent in advanced HEP   Time 8   Period Weeks   Status On-going     PT LONG TERM GOAL #2   Title right shoulder strength >/= 3/5 so he is able to reach above his head to wash his hair   Time 8   Period Weeks   Status On-going     PT LONG TERM GOAL #3   Title right shoulder AROM is within functional ROM so he is able to dress himself with minimal to no difficulty   Time 8   Period Weeks   Status On-going     PT LONG TERM GOAL #4   Title sleep improved >/= 75% due to pain decreased   Time 8   Period Weeks   Status On-going     PT LONG TERM GOAL #5   Title FOTO score is </= 38% limitation   Time 8   Period Weeks   Status On-going     PT LONG TERM GOAL #6   Time 2   Period Weeks   Status On-going               Plan - 09/28/16 1406    Clinical Impression Statement Pt had to spend some time in bathroom which limited treatment time a bit. Shoulder pain was better today and pt appeared to tolerate the AAROm exs better. Added supine scapular stabs with yellow band to HEP today.    Rehab Potential Good   Clinical Impairments Affecting Rehab Potential None   PT Frequency 2x / week   PT Duration 8 weeks   PT Treatment/Interventions Moist Heat;Cryotherapy;Ultrasound;Iontophoresis 4mg /ml Dexamethasone;Therapeutic activities;Therapeutic exercise;Neuromuscular  re-education;Patient/family education;Passive range of motion;Manual techniques;Dry needling;Energy conservation;Taping   PT Next Visit  Plan Ionto #5, review new HEP given today modifiy if needed.    Consulted and Agree with Plan of Care --      Patient will benefit from skilled therapeutic intervention in order to improve the following deficits and impairments:  Decreased range of motion, Increased fascial restricitons, Decreased endurance, Increased muscle spasms, Pain, Decreased activity tolerance, Decreased mobility, Decreased strength  Visit Diagnosis: Muscle weakness (generalized)  Stiffness of right shoulder, not elsewhere classified  Chronic right shoulder pain     Problem List Patient Active Problem List   Diagnosis Date Noted  . History of DVT (deep vein thrombosis)   . Long-term (current) use of anticoagulants   . Old anterior myocardial infarction   . Sleep apnea   . Anemia of chronic disease 10/16/2015  . Chronic combined systolic and diastolic congestive heart failure (HCC) 09/25/2015  . Chronic respiratory failure (HCC) 09/17/2015  . 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 Ischemic cardiomyopathy   . CAD (coronary artery disease), native coronary artery   . Morbid obesity (HCC) 05/20/2015  . Benign prostatic hyperplasia with urinary obstruction 04/24/2015  . Chronic LBP 12/05/2013  . Essential hypertension 09/04/2013    Melita Villalona, PTA 09/28/2016, 2:50 PM  [facility] [address], [address] [place], [place], [address] Phone: [phone]   Fax:  [phone]  Name: Jonathan Le MRN: [id number] Date of Birth: 03/27/1946

## 2016-09-28 NOTE — Patient Instructions (Signed)
HEP with yellow band:  -Laying on your back with your legs elevated: Elbows bent by your side, arms resting by your side. Pull band apart slowly, 2x10 T  - [place]: Holding yellow band in both hands, left arm is straight up in the air with the right arm, but you will pull the right elbow to the bed then go back up. Pull again 10x. Do 3x day  -Horizontal pulls: Holding yellow band with arms straight up in the air, pull arms straight out to the sides 10x. Do 2x day   Do your cane exercises 3x day.

## 2016-10-03 ENCOUNTER — Encounter: Payer: Self-pay | Admitting: Physical Therapy

## 2016-10-03 ENCOUNTER — Ambulatory Visit: Payer: Medicare Other | Admitting: Physical Therapy

## 2016-10-03 DIAGNOSIS — M25511 Pain in right shoulder: Secondary | ICD-10-CM

## 2016-10-03 DIAGNOSIS — G8929 Other chronic pain: Secondary | ICD-10-CM

## 2016-10-03 DIAGNOSIS — M25611 Stiffness of right shoulder, not elsewhere classified: Secondary | ICD-10-CM

## 2016-10-03 DIAGNOSIS — M6281 Muscle weakness (generalized): Secondary | ICD-10-CM

## 2016-10-03 NOTE — Therapy (Signed)
[facility] [address], [address] [place], [place], [address] Phone: [phone]   Fax:  [phone]  Physical Therapy Treatment  Patient Details  Name: Jonathan Le MRN: [id number] Date of Birth: 08/23/1946 Referring Provider: Dr. Elizabeth Palaueresa Anderson  Encounter Date: 10/03/2016      PT End of Session - 10/03/16 1407    Visit Number 7   Number of Visits 10   Date for PT Re-Evaluation 10/26/16   Authorization Type Use KX modifier on all visits; g-code on 10th visit   PT Start Time 1400   PT Stop Time 1455   PT Time Calculation (min) 55 min   Activity Tolerance Patient tolerated treatment well   Behavior During Therapy [facility] for tasks assessed/performed      Past Medical History:  Diagnosis Date  . Adult situational stress disorder   . Anemia   . Anxiety   . Arthritis   . CHF (congestive heart failure) (HCC)    MI, with open heart surgery x 30 sdays ago per pt. (06/26/15)  . Chronic low back pain   . Decubital ulcer   . DVT (deep venous thrombosis) (HCC)   . ED (erectile dysfunction)   . ETD (eustachian tube dysfunction)   . Ganglion cyst   . GERD (gastroesophageal reflux disease)   . Glaucoma   . Hypertension   . Ileostomy in place [facility](HCC)   . MI (myocardial infarction)   . Osteoarthritis   . S/P CABG (coronary artery bypass graft)   . Sleep apnea    wears CPAP nightly    Past Surgical History:  Procedure Laterality Date  . CARDIAC CATHETERIZATION N/A 05/20/2015   Procedure: Left Heart Cath and Coronary Angiography;  Surgeon: Kathleene Hazelhristopher D McAlhany, MD;  [facility];  Service: Cardiovascular;  Laterality: N/A;  . CORONARY ARTERY BYPASS GRAFT N/A 05/22/2015   Procedure: CORONARY ARTERY BYPASS GRAFTING (CABG), ON PUMP, TIMES THREE, USING LEFT INTERNAL MAMMARY ARTERY, RIGHT GREATER SAPHENOUS VEIN HARVESTED ENDOSCOPICALLY;  Surgeon: Kerin PernaPeter Van Trigt, MD;  [facility];  Service: Open Heart Surgery;   Laterality: N/A;  LIMA-LAD, SVG-OM, SVG-PD  . EYE SURGERY     bil cataract  . FASCIOTOMY FOOT / TOE Left    foot  . HERNIA REPAIR     right  . LAPAROTOMY N/A 06/18/2015   Procedure: EXPLORATORY LAPAROTOMY;  Surgeon: Emelia LoronMatthew Wakefield, MD;  [facility];  Service: General;  Laterality: N/A;  . LAPAROTOMY N/A 06/20/2015   Procedure: WASHOUT OF ABDOMEN, CLOSURE OF ABDOMINAL WALL WITH PLACEMENT OF WOUND VAC AND ILLEOSTOMY ;  Surgeon: Almond LintFaera Byerly, MD;  Location: [facility] OR;  Service: General;  Laterality: N/A;  . TEE WITHOUT CARDIOVERSION N/A 05/22/2015   Procedure: TRANSESOPHAGEAL ECHOCARDIOGRAM (TEE);  Surgeon: Kerin PernaPeter Van Trigt, MD;  [facility];  Service: Open Heart Surgery;  Laterality: N/A;  . TRACHEOSTOMY TUBE PLACEMENT N/A 07/03/2015   Procedure: TRACHEOSTOMY;  Surgeon: Serena ColonelJefry Rosen, MD;  [facility];  Service: ENT;  Laterality: N/A;  . WISDOM TOOTH EXTRACTION      There were no vitals filed for this visit.      Subjective Assessment - 10/03/16 1412    Subjective No worse after last session.   Currently in Pain? Yes   Pain Location Shoulder   Pain Orientation Right   Pain Descriptors / Indicators Dull;Aching;Sore   Aggravating Factors  Use, at night   Pain Relieving Factors Rest of arm   Multiple Pain Sites  No            [facility] PT Assessment - 10/03/16 0001      AROM   Right Shoulder Flexion --  110 in scapular plane, cannot lift in sgaittal plane                     [facility] Adult PT Treatment/Exercise - 10/03/16 0001      Knee/Hip Exercises: Aerobic   Nustep L2 x 15 min  Had to stop use of RT arm at 10 min mark     Shoulder Exercises: Supine   Flexion AAROM;Both  30x pushups elbow flexed, then elbows straight overhead 30x   Other Supine Exercises yellow scap unattached exs: ER , rows, horizontal abd 2x10  no increase pain     Shoulder Exercises: Pulleys   Flexion 3 minutes   ABduction 3 minutes                  PT Short Term Goals -  10/03/16 1424      PT SHORT TERM GOAL #2   Title right shoulder pain decreased >/= 25% due to improved mobility   Time 4   Status On-going     PT SHORT TERM GOAL #3   Title right shoulder AROM for flexion >/= 90 degrees   Time 4   Period Weeks   Status On-going  110 in scapular plane     PT SHORT TERM GOAL #4   Title walk for 10 minutes without significant fatigue   Time 4   Period Weeks   Status Achieved           PT Long Term Goals - 09/12/16 1241      PT LONG TERM GOAL #1   Title be independent in advanced HEP   Time 8   Period Weeks   Status On-going     PT LONG TERM GOAL #2   Title right shoulder strength >/= 3/5 so he is able to reach above his head to wash his hair   Time 8   Period Weeks   Status On-going     PT LONG TERM GOAL #3   Title right shoulder AROM is within functional ROM so he is able to dress himself with minimal to no difficulty   Time 8   Period Weeks   Status On-going     PT LONG TERM GOAL #4   Title sleep improved >/= 75% due to pain decreased   Time 8   Period Weeks   Status On-going     PT LONG TERM GOAL #5   Title FOTO score is </= 38% limitation   Time 8   Period Weeks   Status On-going     PT LONG TERM GOAL #6   Time 2   Period Weeks   Status On-going               Plan - 10/03/16 1408    Clinical Impression Statement Pt is no greater pain performng supine scapular unattached exercises with yellow band. Unsure of compliance with HEP. Pt's motion in the scapular plane continues to improve although he still cannot lift in the sagittal plane.    Rehab Potential Good   Clinical Impairments Affecting Rehab Potential None   PT Frequency 2x / week   PT Duration 8 weeks   PT Treatment/Interventions Moist Heat;Cryotherapy;Ultrasound;Iontophoresis 4mg /ml Dexamethasone;Therapeutic activities;Therapeutic exercise;Neuromuscular re-education;Patient/family education;Passive range of motion;Manual techniques;Dry  needling;Energy conservation;Taping   PT Next  Visit Plan Ionto #5, gentle scapular strength, try cone stacking next    Consulted and Agree with Plan of Care Patient      Patient will benefit from skilled therapeutic intervention in order to improve the following deficits and impairments:  Decreased range of motion, Increased fascial restricitons, Decreased endurance, Increased muscle spasms, Pain, Decreased activity tolerance, Decreased mobility, Decreased strength  Visit Diagnosis: Muscle weakness (generalized)  Stiffness of right shoulder, not elsewhere classified  Chronic right shoulder pain     Problem List Patient Active Problem List   Diagnosis Date Noted  . History of DVT (deep vein thrombosis)   . Long-term (current) use of anticoagulants   . Old anterior myocardial infarction   . Sleep apnea   . Anemia of chronic disease 10/16/2015  . Chronic combined systolic and diastolic congestive heart failure (HCC) 09/25/2015  . Chronic respiratory failure (HCC) 09/17/2015  . 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 Ischemic cardiomyopathy   . CAD (coronary artery disease), native coronary artery   . Morbid obesity (HCC) 05/20/2015  . Benign prostatic hyperplasia with urinary obstruction 04/24/2015  . Chronic LBP 12/05/2013  . Essential hypertension 09/04/2013    Montavius Subramaniam, PTA 10/03/2016, 2:47 PM  [facility] [address], [address] [place], [place], [address] Phone: [phone]   Fax:  [phone]  Name: Jonathan Le MRN: [id number] Date of Birth: November 23, 1945

## 2016-10-10 ENCOUNTER — Encounter: Payer: Medicare Other | Admitting: Physical Therapy

## 2016-10-12 ENCOUNTER — Ambulatory Visit: Payer: Medicare Other | Admitting: Physical Therapy

## 2016-10-12 ENCOUNTER — Encounter: Payer: Self-pay | Admitting: Physical Therapy

## 2016-10-12 DIAGNOSIS — M25511 Pain in right shoulder: Secondary | ICD-10-CM

## 2016-10-12 DIAGNOSIS — M6281 Muscle weakness (generalized): Secondary | ICD-10-CM

## 2016-10-12 DIAGNOSIS — M25611 Stiffness of right shoulder, not elsewhere classified: Secondary | ICD-10-CM | POA: Diagnosis not present

## 2016-10-12 DIAGNOSIS — G8929 Other chronic pain: Secondary | ICD-10-CM

## 2016-10-12 NOTE — Therapy (Signed)
[facility] [address] [place], [place], [address] Phone: [phone]   Fax:  [phone]  Physical Therapy Treatment  Patient Details  Name: Jonathan Le MRN: [id number] Date of Birth: [birth year] Referring Provider: Dr. Elizabeth Palau  Encounter Date: 10/12/2016      PT End of Session - 10/12/16 1404    Visit Number 8   Number of Visits 10   Date for PT Re-Evaluation 10/26/16   Authorization Type Use KX modifier on all visits; g-code on 10th visit   PT Start Time 1403   PT Stop Time 1505   PT Time Calculation (min) 62 min   Activity Tolerance Patient tolerated treatment well   Behavior During Therapy [facility] for tasks assessed/performed      Past Medical History:  Diagnosis Date  . Adult situational stress disorder   . Anemia   . Anxiety   . Arthritis   . CHF (congestive heart failure) (HCC)    MI, with open heart surgery x 30 sdays ago per pt. (06/26/15)  . Chronic low back pain   . Decubital ulcer   . DVT (deep venous thrombosis) (HCC)   . ED (erectile dysfunction)   . ETD (eustachian tube dysfunction)   . Ganglion cyst   . GERD (gastroesophageal reflux disease)   . Glaucoma   . Hypertension   . Ileostomy in place [facility])   . MI (myocardial infarction)   . Osteoarthritis   . S/P CABG (coronary artery bypass graft)   . Sleep apnea    wears CPAP nightly    Past Surgical History:  Procedure Laterality Date  . CARDIAC CATHETERIZATION N/A 05/20/2015   Procedure: Left Heart Cath and Coronary Angiography;  Surgeon: Kathleene Hazel, MD;  Location: [facility] INVASIVE CV LAB;  Service: Cardiovascular;  Laterality: N/A;  . CORONARY ARTERY BYPASS GRAFT N/A 05/22/2015   Procedure: CORONARY ARTERY BYPASS GRAFTING (CABG), ON PUMP, TIMES THREE, USING LEFT INTERNAL MAMMARY ARTERY, RIGHT GREATER SAPHENOUS VEIN HARVESTED ENDOSCOPICALLY;  Surgeon: Kerin Perna, MD;  Location: [facility] OR;  Service: Open Heart Surgery;   Laterality: N/A;  LIMA-LAD, SVG-OM, SVG-PD  . EYE SURGERY     bil cataract  . FASCIOTOMY FOOT / TOE Left    foot  . HERNIA REPAIR     right  . LAPAROTOMY N/A 06/18/2015   Procedure: EXPLORATORY LAPAROTOMY;  Surgeon: Emelia Loron, MD;  Location: [facility] OR;  Service: General;  Laterality: N/A;  . LAPAROTOMY N/A 06/20/2015   Procedure: WASHOUT OF ABDOMEN, CLOSURE OF ABDOMINAL WALL WITH PLACEMENT OF WOUND VAC AND ILLEOSTOMY ;  Surgeon: Almond Lint, MD;  Location: [facility] OR;  Service: General;  Laterality: N/A;  . TEE WITHOUT CARDIOVERSION N/A 05/22/2015   Procedure: TRANSESOPHAGEAL ECHOCARDIOGRAM (TEE);  Surgeon: Kerin Perna, MD;  Location[facility] OR;  Service: Open Heart Surgery;  Laterality: N/A;  . TRACHEOSTOMY TUBE PLACEMENT N/A 07/03/2015   Procedure: TRACHEOSTOMY;  Surgeon: Serena Colonel, MD;  Location: [facility] OR;  Service: ENT;  Laterality: N/A;  . WISDOM TOOTH EXTRACTION      There were no vitals filed for this visit.      Subjective Assessment - 10/12/16 1405    Subjective Pt fell in shower the evening of Thanksgiving when the shower seat broke. He sustained a fracture to his tailbone. Pt reports he has seen MD for this.    Currently in Pain? Yes  RT knee also hurts today, 7/10, the same as it typically does  Pain Score 6    Pain Location Shoulder   Pain Orientation Right   Pain Descriptors / Indicators Sore   Aggravating Factors  Use, at night   Pain Relieving Factors Not using arm, rest                         [facility] Adult PT Treatment/Exercise - 10/12/16 0001      Knee/Hip Exercises: Aerobic   Nustep L2 x 15 min  Had to stop use of RT arm at 10 min mark     Shoulder Exercises: Supine   Flexion AAROM;Both  30x pushups elbow flexed, then elbows straight overhead 30x   Other Supine Exercises red scap unattached exs: ER , rows, horizontal abd 2x10  no increase pain     Shoulder Exercises: Standing   Other Standing Exercises Cone stacking to first and second shelf  5 cones each.   Uses scapular plane     Shoulder Exercises: Pulleys   Flexion 3 minutes   ABduction 3 minutes     Iontophoresis   Type of Iontophoresis Dexamethasone   Location RT supraspinatus   Dose 1 ml   Time 6 hr wear  skin intact                  PT Short Term Goals - 10/12/16 1411      PT SHORT TERM GOAL #2   Title right shoulder pain decreased >/= 25% due to improved mobility   Time 4   Period Weeks   Status On-going  Not much change     PT SHORT TERM GOAL #3   Title right shoulder AROM for flexion >/= 90 degrees   Time 4   Period Weeks           PT Long Term Goals - 09/12/16 1241      PT LONG TERM GOAL #1   Title be independent in advanced HEP   Time 8   Period Weeks   Status On-going     PT LONG TERM GOAL #2   Title right shoulder strength >/= 3/5 so he is able to reach above his head to wash his hair   Time 8   Period Weeks   Status On-going     PT LONG TERM GOAL #3   Title right shoulder AROM is within functional ROM so he is able to dress himself with minimal to no difficulty   Time 8   Period Weeks   Status On-going     PT LONG TERM GOAL #4   Title sleep improved >/= 75% due to pain decreased   Time 8   Period Weeks   Status On-going     PT LONG TERM GOAL #5   Title FOTO score is </= 38% limitation   Time 8   Period Weeks   Status On-going     PT LONG TERM GOAL #6   Time 2   Period Weeks   Status On-going               Plan - 10/12/16 1405    Clinical Impression Statement Pt has a recent fall in his shower when his shower seat broke as he was holding onto it shampooing his hair. Despite a fracture to his tailbone, pt reports he conitinues to walk and do his shoulder exercises. As far as lifting his arm against gravity he continues to only lift in the scapular plane. His nigh time  pain might be slightly better per his report today but not 25%.    Rehab Potential Good   Clinical Impairments Affecting Rehab  Potential None   PT Frequency 2x / week   PT Duration 8 weeks   PT Treatment/Interventions Moist Heat;Cryotherapy;Ultrasound;Iontophoresis 4mg /ml Dexamethasone;Therapeutic activities;Therapeutic exercise;Neuromuscular re-education;Patient/family education;Passive range of motion;Manual techniques;Dry needling;Energy conservation;Taping   PT Next Visit Plan G code on 10th visit with re-evaluation.     Consulted and Agree with Plan of Care Patient      Patient will benefit from skilled therapeutic intervention in order to improve the following deficits and impairments:  Decreased range of motion, Increased fascial restricitons, Decreased endurance, Increased muscle spasms, Pain, Decreased activity tolerance, Decreased mobility, Decreased strength  Visit Diagnosis: Muscle weakness (generalized)  Stiffness of right shoulder, not elsewhere classified  Chronic right shoulder pain     Problem List Patient Active Problem List   Diagnosis Date Noted  . History of DVT (deep vein thrombosis)   . Long-term (current) use of anticoagulants   . Old anterior myocardial infarction   . Sleep apnea   . Anemia of chronic disease 10/16/2015  . Chronic combined systolic and diastolic congestive heart failure (HCC) 09/25/2015  . Chronic respiratory failure (HCC) 09/17/2015  . 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 Ischemic cardiomyopathy   . CAD (coronary artery disease), native coronary artery   . Morbid obesity (HCC) 05/20/2015  . Benign prostatic hyperplasia with urinary obstruction 04/24/2015  . Chronic LBP 12/05/2013  . Essential hypertension 09/04/2013    Saundra Gin, PTA 10/12/2016, 2:50 PM   [facility] [address], [address] [place], [place], [address] Phone: [phone]   Fax:  [phone]  Name: Jonathan Le MRN: [id number] Date of Birth: 02/05/1946

## 2016-10-17 ENCOUNTER — Ambulatory Visit: Payer: Medicare Other | Attending: Nurse Practitioner

## 2016-10-17 DIAGNOSIS — M25611 Stiffness of right shoulder, not elsewhere classified: Secondary | ICD-10-CM | POA: Insufficient documentation

## 2016-10-17 DIAGNOSIS — M25511 Pain in right shoulder: Secondary | ICD-10-CM | POA: Diagnosis present

## 2016-10-17 DIAGNOSIS — G8929 Other chronic pain: Secondary | ICD-10-CM | POA: Insufficient documentation

## 2016-10-17 DIAGNOSIS — M6281 Muscle weakness (generalized): Secondary | ICD-10-CM | POA: Diagnosis present

## 2016-10-17 NOTE — Patient Instructions (Signed)

## 2016-10-17 NOTE — Therapy (Signed)
[facility] [address] [place], [place], [address] Phone: [phone]   Fax:  [phone]  Physical Therapy Treatment  Patient Details  Name: Jonathan Le MRN: [id number] Date of Birth: 02/25/1946 Referring Provider: Dr. Elizabeth Palaueresa Anderson  Encounter Date: 10/17/2016      PT End of Session - 10/17/16 1432    Visit Number 9   Number of Visits 19   Date for PT Re-Evaluation 10/26/16   Authorization Type Use KX modifier on all visits; g-code on 10th visit   PT Start Time 1402   PT Stop Time 1448   PT Time Calculation (min) 46 min   Activity Tolerance Patient tolerated treatment well   Behavior During Therapy [facility] for tasks assessed/performed      Past Medical History:  Diagnosis Date  . Adult situational stress disorder   . Anemia   . Anxiety   . Arthritis   . CHF (congestive heart failure) (HCC)    MI, with open heart surgery x 30 sdays ago per pt. (06/26/15)  . Chronic low back pain   . Decubital ulcer   . DVT (deep venous thrombosis) (HCC)   . ED (erectile dysfunction)   . ETD (eustachian tube dysfunction)   . Ganglion cyst   . GERD (gastroesophageal reflux disease)   . Glaucoma   . Hypertension   . Ileostomy in place [facility](HCC)   . MI (myocardial infarction)   . Osteoarthritis   . S/P CABG (coronary artery bypass graft)   . Sleep apnea    wears CPAP nightly    Past Surgical History:  Procedure Laterality Date  . CARDIAC CATHETERIZATION N/A 05/20/2015   Procedure: Left Heart Cath and Coronary Angiography;  Surgeon: Kathleene Hazelhristopher D McAlhany, MD;  [facility];  Service: Cardiovascular;  Laterality: N/A;  . CORONARY ARTERY BYPASS GRAFT N/A 05/22/2015   Procedure: CORONARY ARTERY BYPASS GRAFTING (CABG), ON PUMP, TIMES THREE, USING LEFT INTERNAL MAMMARY ARTERY, RIGHT GREATER SAPHENOUS VEIN HARVESTED ENDOSCOPICALLY;  Surgeon: Kerin PernaPeter Van Trigt, MD;  [facility];  Service: Open Heart Surgery;   Laterality: N/A;  LIMA-LAD, SVG-OM, SVG-PD  . EYE SURGERY     bil cataract  . FASCIOTOMY FOOT / TOE Left    foot  . HERNIA REPAIR     right  . LAPAROTOMY N/A 06/18/2015   Procedure: EXPLORATORY LAPAROTOMY;  Surgeon: Emelia LoronMatthew Wakefield, MD;  [facility];  Service: General;  Laterality: N/A;  . LAPAROTOMY N/A 06/20/2015   Procedure: WASHOUT OF ABDOMEN, CLOSURE OF ABDOMINAL WALL WITH PLACEMENT OF WOUND VAC AND ILLEOSTOMY ;  Surgeon: Almond LintFaera Byerly, MD;  Location: [facility] OR;  Service: General;  Laterality: N/A;  . TEE WITHOUT CARDIOVERSION N/A 05/22/2015   Procedure: TRANSESOPHAGEAL ECHOCARDIOGRAM (TEE);  Surgeon: Kerin PernaPeter Van Trigt, MD;  [facility];  Service: Open Heart Surgery;  Laterality: N/A;  . TRACHEOSTOMY TUBE PLACEMENT N/A 07/03/2015   Procedure: TRACHEOSTOMY;  Surgeon: Serena ColonelJefry Rosen, MD;  [facility];  Service: ENT;  Laterality: N/A;  . WISDOM TOOTH EXTRACTION      There were no vitals filed for this visit.      Subjective Assessment - 10/17/16 1412    Subjective Shoulder isn't really improving   Pertinent History Heart attack: 05/17/15 with CAGB , In and out of hospital and rehab over the past 215 days due to medial complications.  Recent onset of Rt shoulder pain   Patient Stated Goals sleeping, raising right arm overhead,  Currently in Pain? Yes   Pain Score 7    Pain Location Shoulder   Pain Orientation Right   Pain Descriptors / Indicators Sore   Pain Type Chronic pain   Pain Onset More than a month ago   Pain Frequency Intermittent   Aggravating Factors  night, use   Pain Relieving Factors not using arm, rest                         [facility] Adult PT Treatment/Exercise - 10/17/16 0001      Knee/Hip Exercises: Aerobic   Nustep L3 x 15 min  arms and legs     Shoulder Exercises: Supine   Flexion AAROM;Both  30x pushups elbow flexed, then elbows straight overhead 30x   Other Supine Exercises red scap unattached exs: ER , rows, horizontal abd 2x10  no  increase pain     Shoulder Exercises: Standing   Other Standing Exercises Cone stacking to first and second shelf 5 cones each.   Uses scapular plane     Shoulder Exercises: Pulleys   Flexion 3 minutes   ABduction 3 minutes     Iontophoresis   Type of Iontophoresis Dexamethasone   Location RT supraspinatus   Dose 1 ml   Time 6 hr wear  skin intact #5                PT Education - 10/17/16 1426    Education provided Yes   Education Details ionto   Person(s) Educated Patient   Methods Explanation   Comprehension Verbalized understanding          PT Short Term Goals - 10/17/16 1413      PT SHORT TERM GOAL #2   Title right shoulder pain decreased >/= 25% due to improved mobility   Time 4   Period Weeks   Status On-going  5-10%      PT SHORT TERM GOAL #3   Title right shoulder AROM for flexion >/= 90 degrees   Time 4   Period Weeks   Status On-going  74 with elbow bent           PT Long Term Goals - 09/12/16 1241      PT LONG TERM GOAL #1   Title be independent in advanced HEP   Time 8   Period Weeks   Status On-going     PT LONG TERM GOAL #2   Title right shoulder strength >/= 3/5 so he is able to reach above his head to wash his hair   Time 8   Period Weeks   Status On-going     PT LONG TERM GOAL #3   Title right shoulder AROM is within functional ROM so he is able to dress himself with minimal to no difficulty   Time 8   Period Weeks   Status On-going     PT LONG TERM GOAL #4   Title sleep improved >/= 75% due to pain decreased   Time 8   Period Weeks   Status On-going     PT LONG TERM GOAL #5   Title FOTO score is </= 38% limitation   Time 8   Period Weeks   Status On-going     PT LONG TERM GOAL #6   Time 2   Period Weeks   Status On-going               Plan - 10/17/16 1415  Clinical Impression Statement Pt reports that his Rt shoulder feels 5-10% better.  This improvement is inconsistent and varies day to day.   Pt with difficulty moving against gravity with the Rt UE with 74 degrees of flexion with elbow bent.  FOTO is 54% limitation.  Pt will benefit from skilled PT until 10/26/16 to build HEP for strength and A/ROM gains.     Rehab Potential Good   PT Frequency 2x / week   PT Duration 8 weeks   PT Treatment/Interventions Moist Heat;Cryotherapy;Ultrasound;Iontophoresis 4mg /ml Dexamethasone;Therapeutic activities;Therapeutic exercise;Neuromuscular re-education;Patient/family education;Passive range of motion;Manual techniques;Dry needling;Energy conservation;Taping   PT Next Visit Plan Plan D/C in 3 visits.  Build HEP for further gains.  1 more ionto patch to Rt shoulder   Consulted and Agree with Plan of Care Patient      Patient will benefit from skilled therapeutic intervention in order to improve the following deficits and impairments:  Decreased range of motion, Increased fascial restricitons, Decreased endurance, Increased muscle spasms, Pain, Decreased activity tolerance, Decreased mobility, Decreased strength  Visit Diagnosis: Muscle weakness (generalized)  Stiffness of right shoulder, not elsewhere classified  Chronic right shoulder pain       G-Codes - 2016/11/02 1412    Functional Assessment Tool Used FOTO: 54% limitation   Functional Limitation Carrying, moving and handling objects   Carrying, Moving and Handling Objects Current Status (Z6109) At least 40 percent but less than 60 percent impaired, limited or restricted   Carrying, Moving and Handling Objects Goal Status (U0454) At least 20 percent but less than 40 percent impaired, limited or restricted      Problem List Patient Active Problem List   Diagnosis Date Noted  . History of DVT (deep vein thrombosis)   . Long-term (current) use of anticoagulants   . Old anterior myocardial infarction   . Sleep apnea   . Anemia of chronic disease 10/16/2015  . Chronic combined systolic and diastolic congestive heart failure (HCC)  09/25/2015  . Chronic respiratory failure (HCC) 09/17/2015  . 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 Ischemic cardiomyopathy   . CAD (coronary artery disease), native coronary artery   . Morbid obesity (HCC) 05/20/2015  . Benign prostatic hyperplasia with urinary obstruction 04/24/2015  . Chronic LBP 12/05/2013  . Essential hypertension 09/04/2013     Lorrene Reid, PT November 02, 2016 2:36 PM  [facility] [address] [place], [place], [address] Phone: [phone]   Fax:  [phone]  Name: Jonathan Le MRN: [id number] Date of Birth: 04-Oct-1946

## 2016-10-19 ENCOUNTER — Ambulatory Visit: Payer: Medicare Other | Admitting: Physical Therapy

## 2016-10-19 ENCOUNTER — Encounter: Payer: Self-pay | Admitting: Physical Therapy

## 2016-10-19 DIAGNOSIS — M6281 Muscle weakness (generalized): Secondary | ICD-10-CM | POA: Diagnosis not present

## 2016-10-19 DIAGNOSIS — G8929 Other chronic pain: Secondary | ICD-10-CM

## 2016-10-19 DIAGNOSIS — M25511 Pain in right shoulder: Secondary | ICD-10-CM

## 2016-10-19 DIAGNOSIS — M25611 Stiffness of right shoulder, not elsewhere classified: Secondary | ICD-10-CM

## 2016-10-19 NOTE — Therapy (Signed)
[facility] [address], [address] [place], [place], [address] Phone: [phone]   Fax:  [phone]  Physical Therapy Treatment  Patient Details  Name: Jonathan HarpsHarold D Braddock MRN: [id number] Date of Birth: 12/25/1945 Referring Provider: Dr. Elizabeth Palaueresa Anderson  Encounter Date: 10/19/2016      PT End of Session - 10/19/16 1416    Visit Number 10   Number of Visits 19   Date for PT Re-Evaluation 10/26/16   Authorization Type Use KX modifier on all visits; g-code on 10th visit   PT Start Time 1400   PT Stop Time 1450   PT Time Calculation (min) 50 min   Activity Tolerance Patient tolerated treatment well   Behavior During Therapy [facility] for tasks assessed/performed      Past Medical History:  Diagnosis Date  . Adult situational stress disorder   . Anemia   . Anxiety   . Arthritis   . CHF (congestive heart failure) (HCC)    MI, with open heart surgery x 30 sdays ago per pt. (06/26/15)  . Chronic low back pain   . Decubital ulcer   . DVT (deep venous thrombosis) (HCC)   . ED (erectile dysfunction)   . ETD (eustachian tube dysfunction)   . Ganglion cyst   . GERD (gastroesophageal reflux disease)   . Glaucoma   . Hypertension   . Ileostomy in place [facility](HCC)   . MI (myocardial infarction)   . Osteoarthritis   . S/P CABG (coronary artery bypass graft)   . Sleep apnea    wears CPAP nightly    Past Surgical History:  Procedure Laterality Date  . CARDIAC CATHETERIZATION N/A 05/20/2015   Procedure: Left Heart Cath and Coronary Angiography;  Surgeon: Kathleene Hazelhristopher D McAlhany, MD;  [facility];  Service: Cardiovascular;  Laterality: N/A;  . CORONARY ARTERY BYPASS GRAFT N/A 05/22/2015   Procedure: CORONARY ARTERY BYPASS GRAFTING (CABG), ON PUMP, TIMES THREE, USING LEFT INTERNAL MAMMARY ARTERY, RIGHT GREATER SAPHENOUS VEIN HARVESTED ENDOSCOPICALLY;  Surgeon: Kerin PernaPeter Van Trigt, MD;  [facility];  Service: Open Heart Surgery;   Laterality: N/A;  LIMA-LAD, SVG-OM, SVG-PD  . EYE SURGERY     bil cataract  . FASCIOTOMY FOOT / TOE Left    foot  . HERNIA REPAIR     right  . LAPAROTOMY N/A 06/18/2015   Procedure: EXPLORATORY LAPAROTOMY;  Surgeon: Emelia LoronMatthew Wakefield, MD;  [facility];  Service: General;  Laterality: N/A;  . LAPAROTOMY N/A 06/20/2015   Procedure: WASHOUT OF ABDOMEN, CLOSURE OF ABDOMINAL WALL WITH PLACEMENT OF WOUND VAC AND ILLEOSTOMY ;  Surgeon: Almond LintFaera Byerly, MD;  [facility]: [facility] OR;  Service: General;  Laterality: N/A;  . TEE WITHOUT CARDIOVERSION N/A 05/22/2015   Procedure: TRANSESOPHAGEAL ECHOCARDIOGRAM (TEE);  Surgeon: Kerin PernaPeter Van Trigt, MD;  [facility];  Service: Open Heart Surgery;  Laterality: N/A;  . TRACHEOSTOMY TUBE PLACEMENT N/A 07/03/2015   Procedure: TRACHEOSTOMY;  Surgeon: Serena ColonelJefry Rosen, MD;  [facility];  Service: ENT;  Laterality: N/A;  . WISDOM TOOTH EXTRACTION      There were no vitals filed for this visit.      Subjective Assessment - 10/19/16 1414    Subjective Constant pain, tailbone hurts more today. Taking Tylenol for that.    Currently in Pain? Yes   Pain Score --  Shoulder 5/10, tailbone 7/10 increases with sit to stand transition   Pain Location Shoulder   Pain Orientation Right   Pain Descriptors / Indicators  Sore                         [facility] Adult PT Treatment/Exercise - 10/19/16 0001      Knee/Hip Exercises: Aerobic   Nustep L3 x 15 min  arms and legs     Shoulder Exercises: Supine   Flexion AAROM;Both  30x pushups elbow flexed, then elbows straight overhead 30x   Other Supine Exercises red scap unattached exs: ER , rows, horizontal abd 2x10  no increase pain   Other Supine Exercises Attempted 1# to shelf, increased pain, did a few in scapular plane     Shoulder Exercises: Pulleys   Flexion 3 minutes   ABduction 3 minutes     Iontophoresis   Type of Iontophoresis Dexamethasone   Location RT supraspinatus   Dose 1 ml   Time 6 hr wear   skin intact #6                  PT Short Term Goals - 10/17/16 1413      PT SHORT TERM GOAL #2   Title right shoulder pain decreased >/= 25% due to improved mobility   Time 4   Period Weeks   Status On-going  5-10%      PT SHORT TERM GOAL #3   Title right shoulder AROM for flexion >/= 90 degrees   Time 4   Period Weeks   Status On-going  74 with elbow bent           PT Long Term Goals - 09/12/16 1241      PT LONG TERM GOAL #1   Title be independent in advanced HEP   Time 8   Period Weeks   Status On-going     PT LONG TERM GOAL #2   Title right shoulder strength >/= 3/5 so he is able to reach above his head to wash his hair   Time 8   Period Weeks   Status On-going     PT LONG TERM GOAL #3   Title right shoulder AROM is within functional ROM so he is able to dress himself with minimal to no difficulty   Time 8   Period Weeks   Status On-going     PT LONG TERM GOAL #4   Title sleep improved >/= 75% due to pain decreased   Time 8   Period Weeks   Status On-going     PT LONG TERM GOAL #5   Title FOTO score is </= 38% limitation   Time 8   Period Weeks   Status On-going     PT LONG TERM GOAL #6   Time 2   Period Weeks   Status On-going               Plan - 10/19/16 1417    Clinical Impression Statement Pt continues to require verbal  cuing to correct technique with the red band exercises. For this reason we did not add any further exercises to HEP at this time. Did upgrade the color of the band to red for home.    Rehab Potential Good   Clinical Impairments Affecting Rehab Potential None   PT Frequency 2x / week   PT Duration 8 weeks   PT Treatment/Interventions Moist Heat;Cryotherapy;Ultrasound;Iontophoresis 4mg /ml Dexamethasone;Therapeutic activities;Therapeutic exercise;Neuromuscular re-education;Patient/family education;Passive range of motion;Manual techniques;Dry needling;Energy conservation;Taping   PT Next Visit Plan D/C  next week, finalize HEP   Consulted and Agree with Plan  of Care Patient      Patient will benefit from skilled therapeutic intervention in order to improve the following deficits and impairments:  Decreased range of motion, Increased fascial restricitons, Decreased endurance, Increased muscle spasms, Pain, Decreased activity tolerance, Decreased mobility, Decreased strength  Visit Diagnosis: Stiffness of right shoulder, not elsewhere classified  Muscle weakness (generalized)  Chronic right shoulder pain     Problem List Patient Active Problem List   Diagnosis Date Noted  . History of DVT (deep vein thrombosis)   . Long-term (current) use of anticoagulants   . Old anterior myocardial infarction   . Sleep apnea   . Anemia of chronic disease 10/16/2015  . Chronic combined systolic and diastolic congestive heart failure (HCC) 09/25/2015  . Chronic respiratory failure (HCC) 09/17/2015  . 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 Ischemic cardiomyopathy   . CAD (coronary artery disease), native coronary artery   . Morbid obesity (HCC) 05/20/2015  . Benign prostatic hyperplasia with urinary obstruction 04/24/2015  . Chronic LBP 12/05/2013  . Essential hypertension 09/04/2013    Duward Allbritton, PTA 10/19/2016, 2:39 PM  [facility] [address], [address] [place], [place], [address] Phone: [phone]   Fax:  [phone]  Name: Jonathan HarpsHarold D Mcglasson MRN: [id number] Date of Birth: 11/04/1946

## 2016-10-24 ENCOUNTER — Encounter: Payer: Medicare Other | Admitting: Physical Therapy

## 2016-10-26 ENCOUNTER — Ambulatory Visit: Payer: Medicare Other

## 2016-10-26 DIAGNOSIS — M6281 Muscle weakness (generalized): Secondary | ICD-10-CM

## 2016-10-26 DIAGNOSIS — M25611 Stiffness of right shoulder, not elsewhere classified: Secondary | ICD-10-CM

## 2016-10-26 DIAGNOSIS — M25511 Pain in right shoulder: Secondary | ICD-10-CM

## 2016-10-26 DIAGNOSIS — G8929 Other chronic pain: Secondary | ICD-10-CM

## 2016-10-26 NOTE — Therapy (Signed)
[facility] [address], [place], [place], [address] Phone: [phone]   Fax:  [phone]  Physical Therapy Treatment  Patient Details  Name: Jonathan Le MRN: [id number] Date of Birth: [birth year] Referring Provider: Dr. Vicenta Aly  Encounter Date: 10/26/2016      PT End of Session - 10/26/16 1433    Visit Number 11   PT Start Time 5248   PT Stop Time 1435   PT Time Calculation (min) 33 min   Activity Tolerance Patient limited by fatigue;Patient limited by pain   Behavior During Therapy [facility] for tasks assessed/performed      Past Medical History:  Diagnosis Date  . Adult situational stress disorder   . Anemia   . Anxiety   . Arthritis   . CHF (congestive heart failure) (HCC)    MI, with open heart surgery x 30 sdays ago per pt. (06/26/15)  . Chronic low back pain   . Decubital ulcer   . DVT (deep venous thrombosis) ([place])   . ED (erectile dysfunction)   . ETD (eustachian tube dysfunction)   . Ganglion cyst   . GERD (gastroesophageal reflux disease)   . Glaucoma   . Hypertension   . Ileostomy in place [facility])   . MI (myocardial infarction)   . Osteoarthritis   . S/P CABG (coronary artery bypass graft)   . Sleep apnea    wears CPAP nightly    Past Surgical History:  Procedure Laterality Date  . CARDIAC CATHETERIZATION N/A 05/20/2015   Procedure: Left Heart Cath and Coronary Angiography;  Surgeon: Burnell Blanks, MD;  Location: [place] CV LAB;  Service: Cardiovascular;  Laterality: N/A;  . CORONARY ARTERY BYPASS GRAFT N/A 05/22/2015   Procedure: CORONARY ARTERY BYPASS GRAFTING (CABG), ON PUMP, TIMES THREE, USING LEFT INTERNAL MAMMARY ARTERY, RIGHT GREATER SAPHENOUS VEIN HARVESTED ENDOSCOPICALLY;  Surgeon: Ivin Poot, MD;  Location: [place];  Service: Open Heart Surgery;  Laterality: N/A;  LIMA-LAD, SVG-OM, SVG-PD  . EYE SURGERY     bil cataract  . FASCIOTOMY FOOT / TOE Left    foot   . HERNIA REPAIR     right  . LAPAROTOMY N/A 06/18/2015   Procedure: EXPLORATORY LAPAROTOMY;  Surgeon: Rolm Bookbinder, MD;  Location: [place];  Service: General;  Laterality: N/A;  . LAPAROTOMY N/A 06/20/2015   Procedure: WASHOUT OF ABDOMEN, CLOSURE OF ABDOMINAL WALL WITH PLACEMENT OF WOUND VAC AND ILLEOSTOMY ;  Surgeon: Stark Klein, MD;  Location: [place];  Service: General;  Laterality: N/A;  . TEE WITHOUT CARDIOVERSION N/A 05/22/2015   Procedure: TRANSESOPHAGEAL ECHOCARDIOGRAM (TEE);  Surgeon: Ivin Poot, MD;  Location: [place];  Service: Open Heart Surgery;  Laterality: N/A;  . TRACHEOSTOMY TUBE PLACEMENT N/A 07/03/2015   Procedure: TRACHEOSTOMY;  Surgeon: Izora Gala, MD;  Location: [place];  Service: ENT;  Laterality: N/A;  . WISDOM TOOTH EXTRACTION      There were no vitals filed for this visit.      Subjective Assessment - 10/26/16 1406    Subjective Tailbone pain is worse today.  Can't do much.     Pertinent History Heart attack: 05/17/15 with CAGB , In and out of hospital and rehab over the past 215 days due to medial complications.  Recent onset of Rt shoulder pain   Patient Stated Goals sleeping, raising right arm overhead,    Currently in Pain? Yes   Pain Score 5    Pain Location Shoulder  Pain Orientation Right   Pain Descriptors / Indicators Sore   Pain Type Chronic pain   Pain Onset More than a month ago   Pain Frequency Intermittent   Aggravating Factors  night, use   Pain Relieving Factors not using arm, rest            [facility] PT Assessment - 10/26/16 0001      Assessment   Medical Diagnosis Right shoulder tendonitis     Precautions   Precautions [place] residence     Prior Function   Level of Independence Independent     Cognition   Overall Cognitive Status Within Functional Limits for tasks assessed     Observation/Other Assessments   Focus on Therapeutic Outcomes (FOTO)  54% limitation     AROM    Right Shoulder Flexion 69 Degrees  bent elbow and scapular substitution   Right Shoulder ABduction 90 Degrees     Strength   Strength Assessment Site Shoulder   Right Shoulder Flexion 3/5   Right Shoulder ABduction 4-/5   Right Shoulder Internal Rotation 4+/5  tested in neutral   Right Shoulder External Rotation 3-/5                     [facility] Adult PT Treatment/Exercise - 10/26/16 0001      Knee/Hip Exercises: Aerobic   Nustep L3 x 15 min  arms and legs     Shoulder Exercises: Pulleys   Flexion 3 minutes   ABduction 3 minutes     Iontophoresis   Type of Iontophoresis Dexamethasone   Location Rt supraspinatus tendon   Dose 61m   Time 6 hr wear  skin intact                PT Education - 10/26/16 1436    Education provided Yes   Education Details ionto instructions   Person(s) Educated Patient   Methods Explanation;Handout   Comprehension Verbalized understanding          PT Short Term Goals - 10/17/16 1413      PT SHORT TERM GOAL #2   Title right shoulder pain decreased >/= 25% due to improved mobility   Time 4   Period Weeks   Status On-going  5-10%      PT SHORT TERM GOAL #3   Title right shoulder AROM for flexion >/= 90 degrees   Time 4   Period Weeks   Status On-going  74 with elbow bent           PT Long Term Goals - 10/26/16 1403      PT LONG TERM GOAL #1   Title be independent in advanced HEP   Status Achieved     PT LONG TERM GOAL #2   Title right shoulder strength >/= 3/5 so he is able to reach above his head to wash his hair   Status Partially Met     PT LONG TERM GOAL #3   Title right shoulder AROM is within functional ROM so he is able to dress himself with minimal to no difficulty   Status Not Met     PT LONG TERM GOAL #4   Title sleep improved >/= 75% due to pain decreased   Status Not Met     PT LONG TERM GOAL #5   Title FOTO score is </= 38% limitation   Status Not Met  Plan  - 10-29-16 1407    Clinical Impression Statement Pt will be discharged to HEP today.  No significant change in pain, A/ROM and strength of Rt shoulder.  FOTO is 54% limitation and A/ROM and strength of Rt shoulder are still limited.  Pt will follow-up with MD as needed.     PT Next Visit Plan D/C PT to HEP   Consulted and Agree with Plan of Care Patient      Patient will benefit from skilled therapeutic intervention in order to improve the following deficits and impairments:     Visit Diagnosis: Stiffness of right shoulder, not elsewhere classified  Muscle weakness (generalized)  Chronic right shoulder pain       G-Codes - 10-29-16 1405    Functional Assessment Tool Used FOTO: 54% limitation   Functional Limitation Carrying, moving and handling objects   Carrying, Moving and Handling Objects Goal Status (H2122) At least 20 percent but less than 40 percent impaired, limited or restricted   Carrying, Moving and Handling Objects Discharge Status [phone]) At least 40 percent but less than 60 percent impaired, limited or restricted      Problem List Patient Active Problem List   Diagnosis Date Noted  . History of DVT (deep vein thrombosis)   . Long-term (current) use of anticoagulants   . Old anterior myocardial infarction   . Sleep apnea   . Anemia of chronic disease 10/16/2015  . Chronic combined systolic and diastolic congestive heart failure ([place]) 09/25/2015  . Chronic respiratory failure ([place]) 09/17/2015  . 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 Ischemic cardiomyopathy   . CAD (coronary artery disease), native coronary artery   . Morbid obesity ([place]) 05/20/2015  . Benign prostatic hyperplasia with urinary obstruction 04/24/2015  . Chronic LBP 12/05/2013  . Essential hypertension 09/04/2013    PHYSICAL THERAPY DISCHARGE SUMMARY  Visits from Start of Care: 11  Current functional level related to goals / functional outcomes: See above for current status.     Remaining  deficits: Rt shoulder pain, weakness and limited A/ROM that limits functional use.  Pt has a HEP in place.     Education / Equipment: HEP Plan: Patient agrees to discharge.  Patient goals were partially met. Patient is being discharged due to being pleased with the current functional level.  ?????         Sigurd Sos, PT October 29, 2016 2:39 PM  [facility] [address], [place], [place], [address] Phone: [phone]   Fax:  [phone]  Name: Jonathan Le MRN: [id number] Date of Birth: 05-May-1946

## 2016-10-26 NOTE — Patient Instructions (Signed)

## 2017-05-01 ENCOUNTER — Encounter (INDEPENDENT_AMBULATORY_CARE_PROVIDER_SITE_OTHER): Payer: Self-pay | Admitting: Orthopedic Surgery

## 2017-05-01 ENCOUNTER — Ambulatory Visit (INDEPENDENT_AMBULATORY_CARE_PROVIDER_SITE_OTHER): Payer: Medicare Other | Admitting: Orthopedic Surgery

## 2017-05-01 ENCOUNTER — Ambulatory Visit (INDEPENDENT_AMBULATORY_CARE_PROVIDER_SITE_OTHER): Payer: Medicare Other

## 2017-05-01 DIAGNOSIS — M25561 Pain in right knee: Secondary | ICD-10-CM

## 2017-05-01 DIAGNOSIS — M25511 Pain in right shoulder: Secondary | ICD-10-CM | POA: Diagnosis not present

## 2017-05-01 DIAGNOSIS — M25562 Pain in left knee: Secondary | ICD-10-CM

## 2017-05-01 NOTE — Progress Notes (Signed)
Office Visit Note   Patient: Jonathan Le           Date of Birth: [birth year]           MRN: [id number] Visit Date: 05/01/2017 Requested by: Jonathan Palau, FNP [address] [name], [place] [address] PCP: Jonathan Palau, FNP  Subjective: Chief Complaint  Patient presents with  . Right Shoulder - Pain  . Right Knee - Pain  . Left Knee - Pain    HPI: Jonathan Le is a 71 year old patient with bilateral knee pain right worse than left.  Has known history of arthritis.  Pain will wake him from sleep at night.  He states the pain is constant.  He describes swelling weakness.  Patient states "I do still with it".  Patient also describes right shoulder pain which is become worse over the last year.  He will be awakened from sleep with this pain in the shoulder.  His heart and get comfortable at night.  He is right-hand-dominant.  He is using a walker for ambulation.  He is also taking Lasix and Coumadin.  This is among other multiple medications.  He does report bilateral ankle swelling.  He cannot take much in terms of anti-inflammatories because of his Coumadin.              ROS: All systems reviewed are negative as they relate to the chief complaint within the history of present illness.  Patient denies  fevers or chills.   Assessment & Plan: Visit Diagnoses:  1. Pain in both knees, unspecified chronicity   2. Right shoulder pain, unspecified chronicity     Plan: Impression is bilateral knee pain with arthritis right worsening left.  Aspiration of the knee is performed today.  In regards to the shoulder I think he has forward flexion of 20 and abduction of less than 90 on the right-hand side.  He likely has rotator cuff arthropathy.  Not a great surgical candidate for that problem either but I think physical therapy could be helpful.  I'll see him back as needed.  We'll start him in physical therapy at the [place] office.We will do a Synvisc injection in the right knee  today as well.  Follow-Up Instructions: No Follow-up on file.   Orders:  Orders Placed This Encounter  Procedures  . XR Shoulder Right   No orders of the defined types were placed in this encounter.     Procedures: Large Joint Inj Date/Time: 05/04/2017 12:42 PM Performed by: Cammy Copa Authorized by: Cammy Copa   Consent Given by:  Patient Site marked: the procedure site was marked   Timeout: prior to procedure the correct patient, procedure, and site was verified   Indications:  Pain, joint swelling and diagnostic evaluation Location:  Knee Site:  R knee Prep: patient was prepped and draped in usual sterile fashion   Needle Size:  18 G Needle Length:  1.5 inches Approach:  Superolateral Ultrasound Guidance: No   Fluoroscopic Guidance: No   Arthrogram: No   Medications:  5 mL lidocaine 1 %; 4 mL bupivacaine 0.25 %; 48 mg Hylan 48 MG/6ML Patient tolerance:  Patient tolerated the procedure well with no immediate complications     Clinical Data: No additional findings.  Objective: Vital Signs: There were no vitals taken for this visit.  Physical Exam:   Constitutional: Patient appears well-developed HEENT:  Head: Normocephalic Eyes:EOM are normal Neck: Normal range of motion Cardiovascular: Normal rate Pulmonary/chest: Effort  normal Neurologic: Patient is alert Skin: Skin is warm Psychiatric: Patient has normal mood and affect    Ortho Exam: Orthopedic exam demonstrates fairly reasonable gait and alignment.  He does use a walker to ambulate.  Has an effusion in the right knee.  No effusion in the left knee.  Mild pitting edema bilateral lower chemise with palpable pedal pulses.  Ankle dorsi and plantarflexion is intact.  There is no groin pain with internal/external rotation of the leg.  Right shoulder demonstrates active forward flexion of about 20 active abduction less than 90.  Some coarse grinding and crepitus is present on the right with  passive range of motion of the shoulder.  Specialty Comments:  No specialty comments available.  Imaging: Xr Shoulder Right  Result Date: 05/01/2017 AP outlet and axillary lateral right shoulder reviewed.  Some glenohumeral arthritis is present noted on the axillary view.  Visualized lung fields clear.  There is mild narrowing of the acromiohumeral distance.  Acromioclavicular spurring is noted primarily distal end of the clavicle.  Bones appear mildly osteopenic    PMFS History: Patient Active Problem List   Diagnosis Date Noted  . History of DVT (deep vein thrombosis)   . Long-term (current) use of anticoagulants   . Old anterior myocardial infarction   . Sleep apnea   . Anemia of chronic disease 10/16/2015  . Chronic combined systolic and diastolic congestive heart failure (HCC) 09/25/2015  . Chronic respiratory failure (HCC) 09/17/2015  . 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 Ischemic cardiomyopathy   . CAD (coronary artery disease), native coronary artery   . Morbid obesity (HCC) 05/20/2015  . Benign prostatic hyperplasia with urinary obstruction 04/24/2015  . Chronic LBP 12/05/2013  . Essential hypertension 09/04/2013   Past Medical History:  Diagnosis Date  . Adult situational stress disorder   . Anemia   . Anxiety   . Arthritis   . CHF (congestive heart failure) (HCC)    MI, with open heart surgery x 30 sdays ago per pt. (06/26/15)  . Chronic low back pain   . Decubital ulcer   . DVT (deep venous thrombosis) (HCC)   . ED (erectile dysfunction)   . ETD (eustachian tube dysfunction)   . Ganglion cyst   . GERD (gastroesophageal reflux disease)   . Glaucoma   . Hypertension   . Ileostomy in place [facility](HCC)   . MI (myocardial infarction) (HCC)   . Osteoarthritis   . S/P CABG (coronary artery bypass graft)   . Sleep apnea    wears CPAP nightly    Family History  Problem Relation Age of Onset  . Alzheimer's disease Father   . Rheum arthritis Mother     Past Surgical  History:  Procedure Laterality Date  . CARDIAC CATHETERIZATION N/A 05/20/2015   Procedure: Left Heart Cath and Coronary Angiography;  Surgeon: Kathleene Hazelhristopher D McAlhany, MD;  [facility];  Service: Cardiovascular;  Laterality: N/A;  . CORONARY ARTERY BYPASS GRAFT N/A 05/22/2015   Procedure: CORONARY ARTERY BYPASS GRAFTING (CABG), ON PUMP, TIMES THREE, USING LEFT INTERNAL MAMMARY ARTERY, RIGHT GREATER SAPHENOUS VEIN HARVESTED ENDOSCOPICALLY;  Surgeon: Kerin PernaPeter Van Trigt, MD;  [facility];  Service: Open Heart Surgery;  Laterality: N/A;  LIMA-LAD, SVG-OM, SVG-PD  . EYE SURGERY     bil cataract  . FASCIOTOMY FOOT / TOE Left    foot  . HERNIA REPAIR     right  . LAPAROTOMY N/A 06/18/2015   Procedure: EXPLORATORY LAPAROTOMY;  Surgeon:  Emelia Loron, MD;  Location: [facility] OR;  Service: General;  Laterality: N/A;  . LAPAROTOMY N/A 06/20/2015   Procedure: WASHOUT OF ABDOMEN, CLOSURE OF ABDOMINAL WALL WITH PLACEMENT OF WOUND VAC AND ILLEOSTOMY ;  Surgeon: Almond Lint, MD;  [facility];  Service: General;  Laterality: N/A;  . TEE WITHOUT CARDIOVERSION N/A 05/22/2015   Procedure: TRANSESOPHAGEAL ECHOCARDIOGRAM (TEE);  Surgeon: Kerin Perna, MD;  [facility] OR;  Service: Open Heart Surgery;  Laterality: N/A;  . TRACHEOSTOMY TUBE PLACEMENT N/A 07/03/2015   Procedure: TRACHEOSTOMY;  Surgeon: Serena Colonel, MD;  Location: [facility] OR;  Service: ENT;  Laterality: N/A;  . WISDOM TOOTH EXTRACTION     Social History   Occupational History  . Not on file.   Social History Main Topics  . Smoking status: Never Smoker  . Smokeless tobacco: Never Used  . Alcohol use No  . Drug use: No  . Sexual activity: Not Currently

## 2017-05-03 ENCOUNTER — Ambulatory Visit (INDEPENDENT_AMBULATORY_CARE_PROVIDER_SITE_OTHER): Payer: Medicare Other | Admitting: Orthopedic Surgery

## 2017-05-04 DIAGNOSIS — M1711 Unilateral primary osteoarthritis, right knee: Secondary | ICD-10-CM

## 2017-05-04 DIAGNOSIS — M25561 Pain in right knee: Secondary | ICD-10-CM

## 2017-05-04 MED ORDER — LIDOCAINE HCL 1 % IJ SOLN
5.0000 mL | INTRAMUSCULAR | Status: AC | PRN
  Administered 2017-05-04: 5 mL

## 2017-05-04 MED ORDER — BUPIVACAINE HCL 0.25 % IJ SOLN
4.0000 mL | INTRAMUSCULAR | Status: AC | PRN
  Administered 2017-05-04: 4 mL via INTRA_ARTICULAR

## 2017-05-04 MED ORDER — HYLAN G-F 20 48 MG/6ML IX SOSY
48.0000 mg | PREFILLED_SYRINGE | INTRA_ARTICULAR | Status: AC | PRN
  Administered 2017-05-04: 48 mg via INTRA_ARTICULAR

## 2017-05-07 IMAGING — CT CT ABD-PELV W/ CM
3 of 15 series · 11 of 46 positions shown, 17 images · IV contrast (Omni 300)
Comparison: Chest radiograph June 18, 2015 at [DATE] a.m.

CLINICAL DATA: Syncopal episode on toilet.

EXAM:
CT ANGIOGRAPHY CHEST
CT ABDOMEN AND PELVIS WITH CONTRAST
TECHNIQUE: Multidetector CT imaging of the chest was performed using the
standard protocol during bolus administration of intravenous
contrast. Multiplanar CT image reconstructions and MIPs were
obtained to evaluate the vascular anatomy. Multidetector CT imaging
of the abdomen and pelvis was performed using the standard protocol
during bolus administration of intravenous contrast.
CONTRAST:  100mL OMNIPAQUE IOHEXOL 350 MG/ML SOLN

[Series 7: thins · axial · 0.98mm/px · z∈[-287,-125]mm · 6 of 264 slices shown]
[im 21/264  soft-tissue]
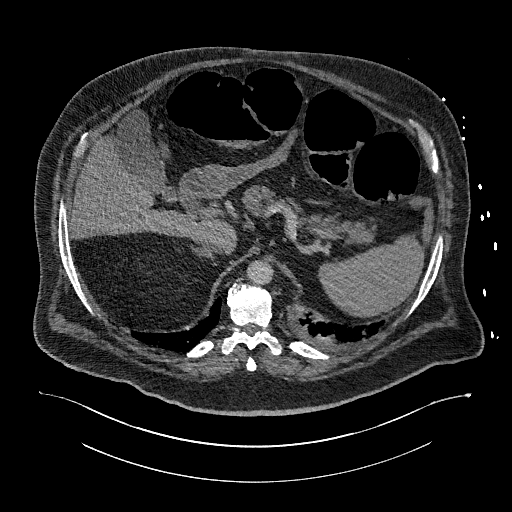
[im 61/264  soft-tissue]
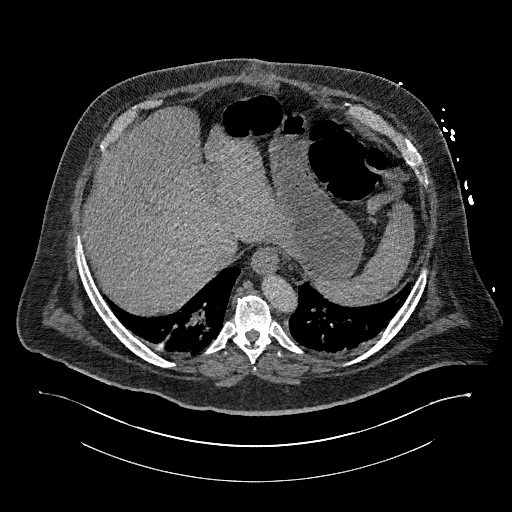
[im 81/264  soft-tissue]
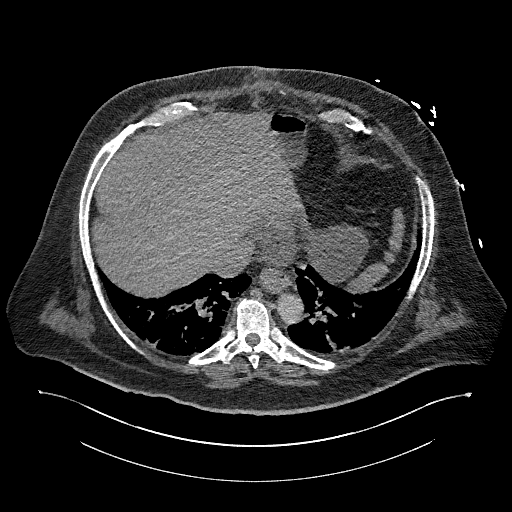
[im 122/264  soft-tissue]
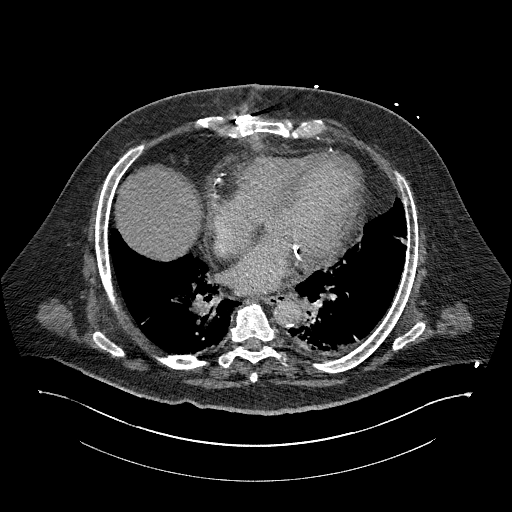
[im 142/264  soft-tissue]
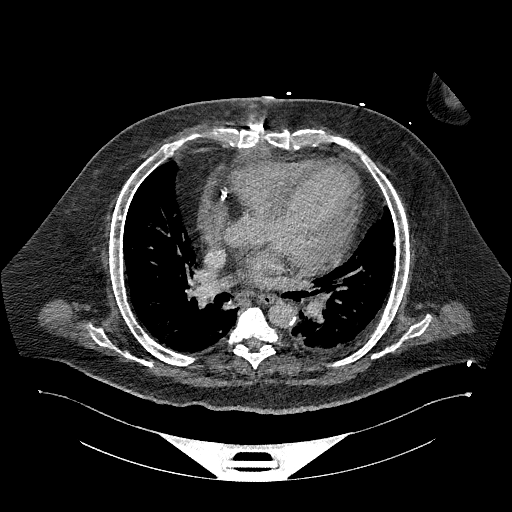
[im 183/264  soft-tissue]
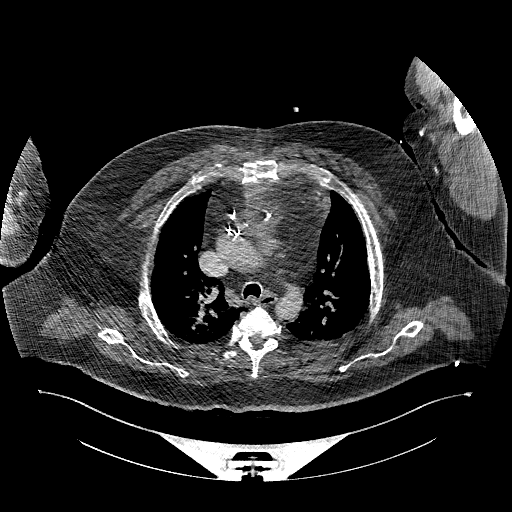

[Series 9: coronal mpr · coronal · 0.62mm/px · 1 of 144 slices shown, 2 images]
[im 72/144  soft-tissue]
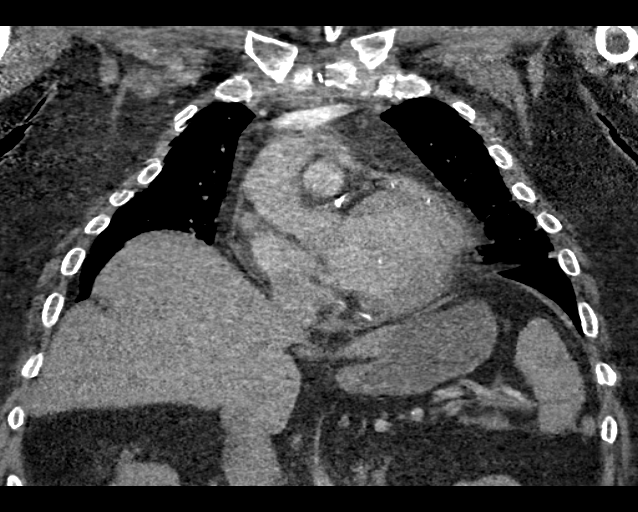
[im 72/144  bone]
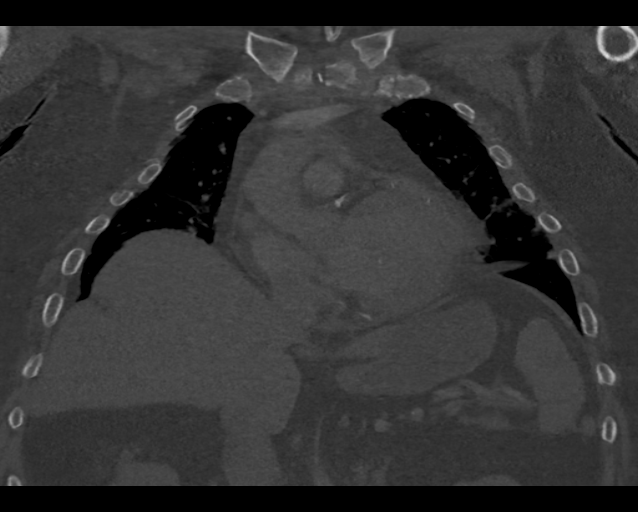

[Series 13: abd/ pelvis 5.0 i30f 1 · axial · 0.98mm/px · z∈[-602,-258]mm · 4 of 115 slices shown, 9 images]
[im 23/115  soft-tissue]
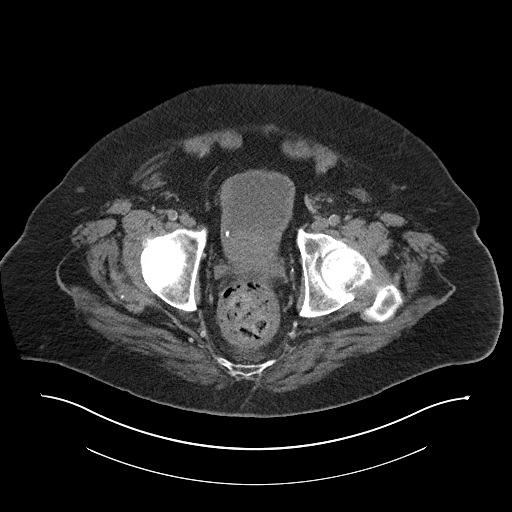
[im 23/115  lung]
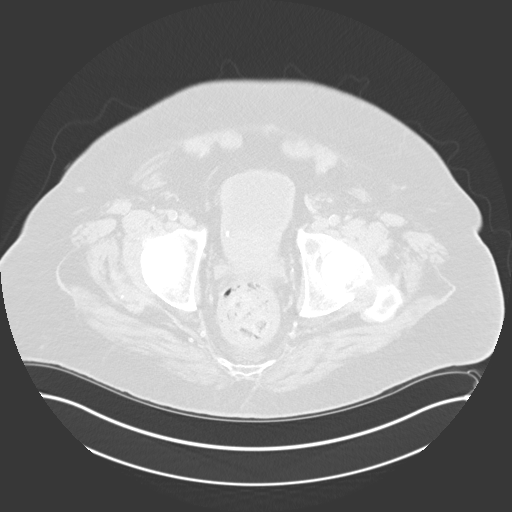
[im 23/115  bone]
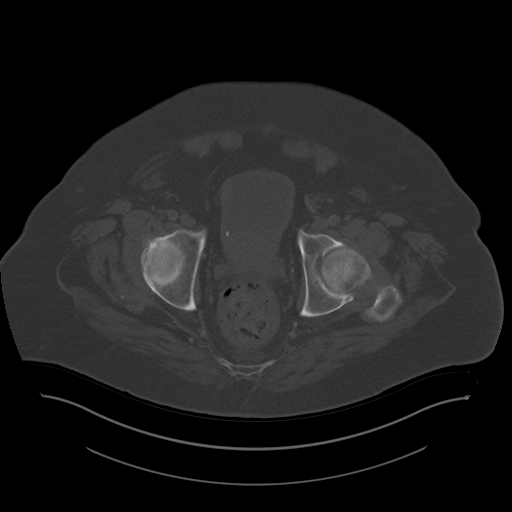
[im 46/115  soft-tissue]
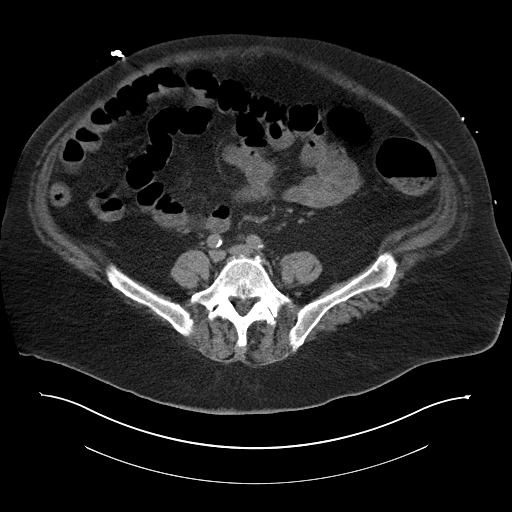
[im 46/115  lung]
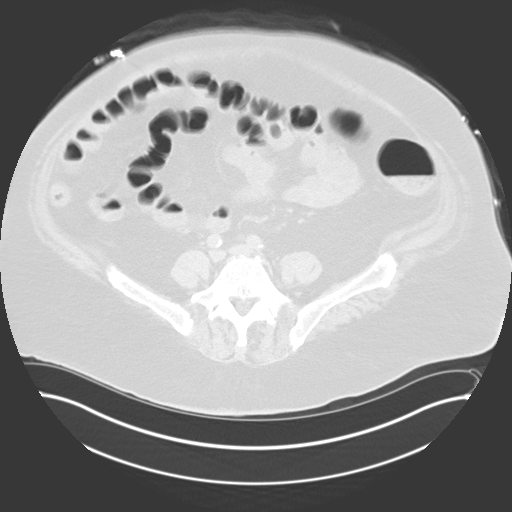
[im 69/115  soft-tissue]
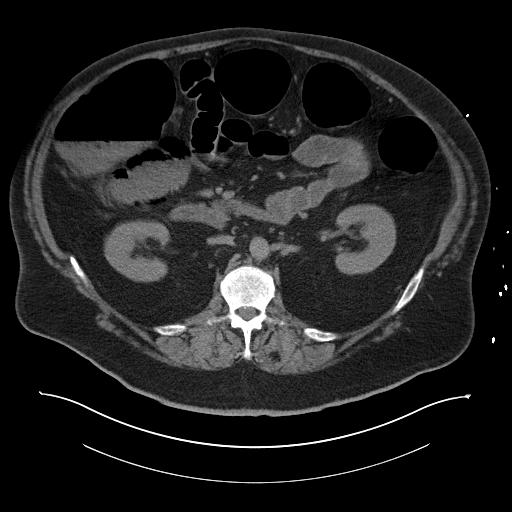
[im 69/115  lung]
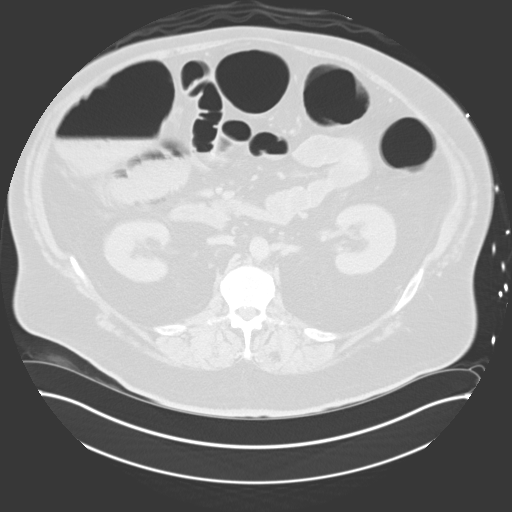
[im 92/115  soft-tissue]
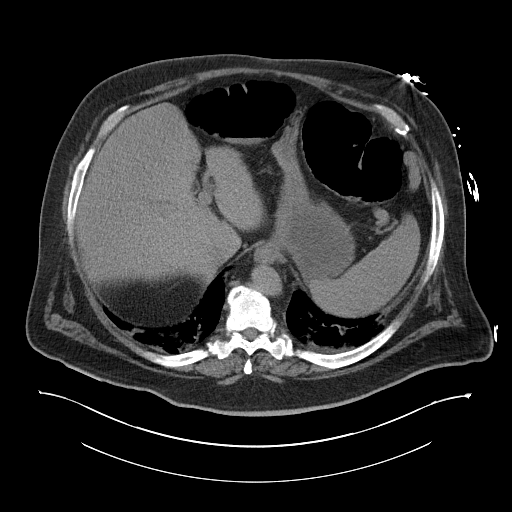
[im 92/115  lung]
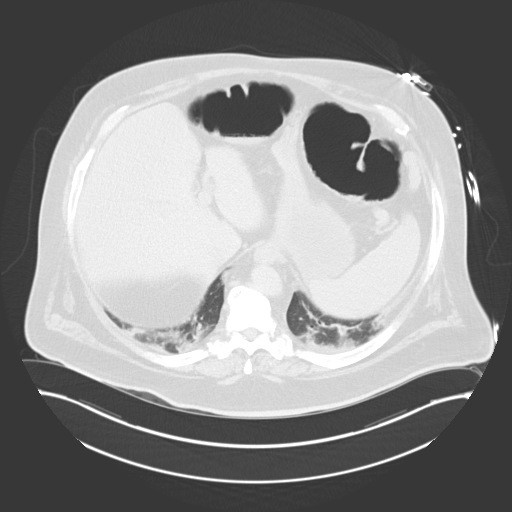

[11 of 46 positions shown; findings below may reference images not displayed]

FINDINGS: CTA CHEST FINDINGS

Please note, due to the reported cracked tubing and back and arm,
limited contrast administration, non angiographic phase.

Mediastinum: Heart size is moderate to severely enlarged, small
pericardial effusion. Status post median sternotomy for CABG.
Thoracic aorta is normal in course and caliber with moderate
calcific atherosclerosis. Pulmonary vascular congestion.

Lungs: Small LEFT pleural effusion. Patchy ground-glass opacities
LEFT lower lobe and, in the bilateral hila. Tracheobronchial tree is
patent and midline, no pneumothorax.

Soft tissues and osseous structures: Large body habitus. No
suspicious bony lesions or acute osseous process.

CT ABDOMEN and PELVIS FINDINGS (respiratory motion degraded
examination)

SOLID ORGANS: The liver, spleen, gallbladder, pancreas and adrenal
glands are unremarkable.

GASTROINTESTINAL TRACT: Moderate to large amount of distal retained
large bowel stool. Gas distended colon up to 6.7 cm. Focal hepatic
flexure pneumatosis. Scattered small bowel air-fluid levels. The
stomach, small bowel are normal in course and caliber without
inflammatory changes. The appendix is not discretely identified,
however there are no inflammatory changes in the right lower
quadrant.

KIDNEYS/ URINARY TRACT: Kidneys are orthotopic, demonstrating
symmetric enhancement. 4 mm RIGHT upper pole, 3 mm RIGHT upper pole
nephrolithiasis, 4 mm LEFT lower pole nephrolithiasis. No
hydronephrosis or solid renal masses. The unopacified ureters are
normal in course and caliber. Delayed imaging through the kidneys
demonstrates symmetric prompt contrast excretion within the proximal
urinary collecting system. Urinary bladder is partially distended, 4
mm calculus dependent in RIGHT bladder.

PERITONEUM/RETROPERITONEUM: Aortoiliac vessels are normal in course
and caliber, mild calcific atherosclerosis. No lymphadenopathy by CT
size criteria. Prostate is enlarged, invading the base the urinary
bladder. No intraperitoneal free fluid nor free air.

SOFT TISSUE/OSSEOUS STRUCTURES: Non-suspicious. Moderate fat
containing LEFT inguinal hernia. Severe degenerative change of the
lumbar spine, grade 1 L4-5 anterolisthesis with multi level severe
neural foraminal narrowing, severe canal stenosis L4-5, moderate to
severe L2-3 and L3-4.

Review of the MIP images confirms the above findings.
IMPRESSION: CTA CHEST:  Limited CTA of the chest, poor bolus timing.

Moderate to severe cardiomegaly with small pericardial effusion.
Evidence of pulmonary edema with small pleural effusions and, patchy
airspace opacities most consistent with confluent edema.

CT ABDOMEN AND PELVIS: Focal colonic hepatic flexure pneumatosis
without bowel perforation. Moderate to large amount of distal
retained large bowel stool. Mild probable ileus, no bowel
obstruction.

Bilateral nonobstructing nephrolithiasis measuring up to 4 mm.

## 2017-05-08 IMAGING — DX DG CHEST 1V PORT
1 series · 1 of 1 positions shown · non-contrast
Comparison: June 18, 2015

CLINICAL DATA: Hypoxia

EXAM:
PORTABLE CHEST - 1 VIEW

[chest ap]
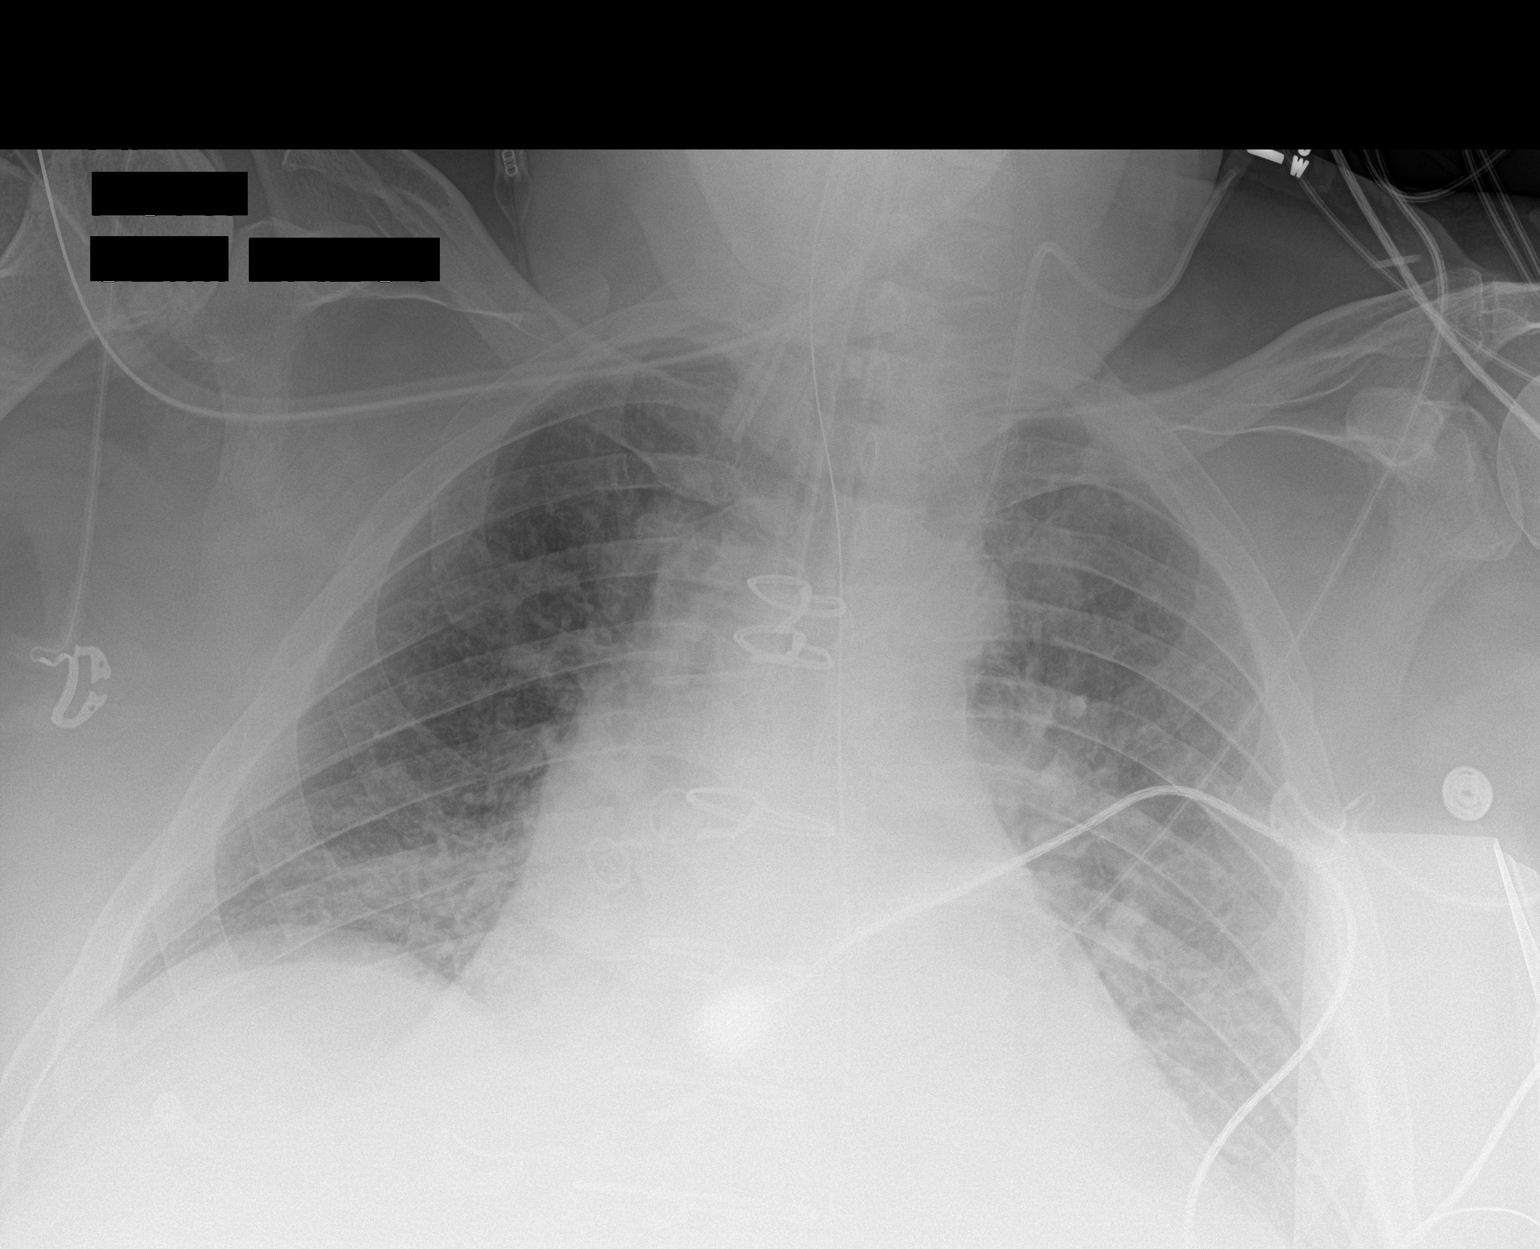

[1 of 1 positions shown; findings below may reference images not displayed]

FINDINGS: Endotracheal tube tip is 1.7 cm above the carina. The left
subclavian catheter tip is at the junction of the left innominate
vein and superior vena cava, stable. Nasogastric tube tip and side
port are below the diaphragm. No pneumothorax.

There is mild bibasilar atelectatic change. There is no frank edema
or consolidation. Heart is mildly enlarged with pulmonary
vascularity within normal limits. No adenopathy. Patient is status
post coronary artery bypass grafting.
IMPRESSION: Tube and catheter positions as described without pneumothorax. Mild
bibasilar atelectasis. No frank edema or consolidation. Heart
prominent but stable.

## 2017-08-30 ENCOUNTER — Ambulatory Visit (INDEPENDENT_AMBULATORY_CARE_PROVIDER_SITE_OTHER): Payer: Medicare Other | Admitting: Orthopedic Surgery

## 2017-09-06 ENCOUNTER — Encounter (INDEPENDENT_AMBULATORY_CARE_PROVIDER_SITE_OTHER): Payer: Self-pay | Admitting: Orthopedic Surgery

## 2017-09-06 ENCOUNTER — Ambulatory Visit (INDEPENDENT_AMBULATORY_CARE_PROVIDER_SITE_OTHER): Payer: Medicare Other | Admitting: Orthopedic Surgery

## 2017-09-06 DIAGNOSIS — M1712 Unilateral primary osteoarthritis, left knee: Secondary | ICD-10-CM

## 2017-09-06 DIAGNOSIS — M1711 Unilateral primary osteoarthritis, right knee: Secondary | ICD-10-CM | POA: Diagnosis not present

## 2017-09-06 NOTE — Progress Notes (Signed)
Office Visit Note   Patient: Jonathan Le           Date of Birth: 05/18/1946           MRN: [id number] Visit Date: 09/06/2017 Requested by: Elizabeth PalauAnderson, Teresa, FNP [address] [place], [place] [id number] PCP: Elizabeth PalauAnderson, Teresa, FNP  Subjective: Chief Complaint  Patient presents with  . Left Knee - Follow-up, Pain  . Right Knee - Follow-up, Pain    HPI: Jonathan Le is a 71 year old patient with bilateral knee pain right worse than left.  Reports generally constant pain.  He is known to have in stage arthritis in both knees.  He did not get much relief from gel injection performed June of this year.  He wants to discuss options.  He feels like he is moving back with his progress.  Getting into and out of a chair is most difficult.  He always walks with cane and walker.  His wife who is 8616 years younger is lamenting the fact that they can't do is much as they haven't been able to do in the past.  He is getting a ramp put into his house next week.  He is averse to surgical intervention.  He does have a cardiac history.  His weight is increased approximately 30-40 pounds this year.              ROS: All systems reviewed are negative as they relate to the chief complaint within the history of present illness.  Patient denies  fevers or chills.   Assessment & Plan: Visit Diagnoses:  1. Unilateral primary osteoarthritis, left knee   2. Unilateral primary osteoarthritis, right knee     Plan: Impression is bilateral knee arthritis right worse than left.  Injections are not helping much.  He still has fairly reasonable range of motion.  Best option at this time is physical therapy for gait training and quad strengthening.  Follow up with me as needed.  Follow-Up Instructions: Return if symptoms worsen or fail to improve.   Orders:  No orders of the defined types were placed in this encounter.  No orders of the defined types were placed in this encounter.     Procedures: No  procedures performed   Clinical Data: No additional findings.  Objective: Vital Signs: There were no vitals taken for this visit.  Physical Exam:   Constitutional: Patient appears well-developed HEENT:  Head: Normocephalic Eyes:EOM are normal Neck: Normal range of motion Cardiovascular: Normal rate Pulmonary/chest: Effort normal Neurologic: Patient is alert Skin: Skin is warm Psychiatric: Patient has normal mood and affect    Ortho Exam: Orthopedic exam demonstrates that the patient ambulates with a walker.  Slight varus alignment in bilateral crepitus with range of motion.  Some pitting edema bilateral lower extremities 1+.  No groin pain with internal/external rotation of the leg.  He can't really flex to 90 in either knee.  But his extensor mechanism is intact.  No real effusion in either knee.  Specialty Comments:  No specialty comments available.  Imaging: No results found.   PMFS History: Patient Active Problem List   Diagnosis Date Noted  . History of DVT (deep vein thrombosis)   . Long-term (current) use of anticoagulants   . Old anterior myocardial infarction   . Sleep apnea   . Anemia of chronic disease 10/16/2015  . Chronic combined systolic and diastolic congestive heart failure (HCC) 09/25/2015  . Chronic respiratory failure (HCC) 09/17/2015  . S/P  CABG x 3 05/22/2015  . Ischemic cardiomyopathy   . CAD (coronary artery disease), native coronary artery   . Morbid obesity (HCC) 05/20/2015  . Benign prostatic hyperplasia with urinary obstruction 04/24/2015  . Chronic LBP 12/05/2013  . Essential hypertension 09/04/2013   Past Medical History:  Diagnosis Date  . Adult situational stress disorder   . Anemia   . Anxiety   . Arthritis   . CHF (congestive heart failure) (HCC)    MI, with open heart surgery x 30 sdays ago per pt. (06/26/15)  . Chronic low back pain   . Decubital ulcer   . DVT (deep venous thrombosis) (HCC)   . ED (erectile dysfunction)    . ETD (eustachian tube dysfunction)   . Ganglion cyst   . GERD (gastroesophageal reflux disease)   . Glaucoma   . Hypertension   . Ileostomy in place [facility])   . MI (myocardial infarction) (HCC)   . Osteoarthritis   . S/P CABG (coronary artery bypass graft)   . Sleep apnea    wears CPAP nightly    Family History  Problem Relation Age of Onset  . Alzheimer's disease Father   . Rheum arthritis Mother     Past Surgical History:  Procedure Laterality Date  . CARDIAC CATHETERIZATION N/A 05/20/2015   Procedure: Left Heart Cath and Coronary Angiography;  Surgeon: Kathleene Hazel, MD;  Location: [facility] INVASIVE CV LAB;  Service: Cardiovascular;  Laterality: N/A;  . CORONARY ARTERY BYPASS GRAFT N/A 05/22/2015   Procedure: CORONARY ARTERY BYPASS GRAFTING (CABG), ON PUMP, TIMES THREE, USING LEFT INTERNAL MAMMARY ARTERY, RIGHT GREATER SAPHENOUS VEIN HARVESTED ENDOSCOPICALLY;  Surgeon: Kerin Perna, MD;  [facility];  Service: Open Heart Surgery;  Laterality: N/A;  LIMA-LAD, SVG-OM, SVG-PD  . EYE SURGERY     bil cataract  . FASCIOTOMY FOOT / TOE Left    foot  . HERNIA REPAIR     right  . LAPAROTOMY N/A 06/18/2015   Procedure: EXPLORATORY LAPAROTOMY;  Surgeon: Emelia Loron, MD;  Location: [facility] OR;  Service: General;  Laterality: N/A;  . LAPAROTOMY N/A 06/20/2015   Procedure: WASHOUT OF ABDOMEN, CLOSURE OF ABDOMINAL WALL WITH PLACEMENT OF WOUND VAC AND ILLEOSTOMY ;  Surgeon: Almond Lint, MD;  Location: [facility] OR;  Service: General;  Laterality: N/A;  . TEE WITHOUT CARDIOVERSION N/A 05/22/2015   Procedure: TRANSESOPHAGEAL ECHOCARDIOGRAM (TEE);  Surgeon: Kerin Perna, MD;  Location: [facility] OR;  Service: Open Heart Surgery;  Laterality: N/A;  . TRACHEOSTOMY TUBE PLACEMENT N/A 07/03/2015   Procedure: TRACHEOSTOMY;  Surgeon: Serena Colonel, MD;  Location: [facility] OR;  Service: ENT;  Laterality: N/A;  . WISDOM TOOTH EXTRACTION     Social History   Occupational History  . Not on file.   Social History  Main Topics  . Smoking status: Never Smoker  . Smokeless tobacco: Never Used  . Alcohol use No  . Drug use: No  . Sexual activity: Not Currently

## 2017-09-21 ENCOUNTER — Telehealth (INDEPENDENT_AMBULATORY_CARE_PROVIDER_SITE_OTHER): Payer: Self-pay | Admitting: *Deleted

## 2017-09-21 NOTE — Telephone Encounter (Signed)
This is a Dr. Dean pt.  

## 2017-09-21 NOTE — Telephone Encounter (Signed)
Please advise 

## 2017-09-21 NOTE — Telephone Encounter (Signed)
Physical Therapist from [facility] called with come concerns with R knee shifting when pt tried to stand up, straighten or bend he can hardly do anything with it. Would like to speak with either Dr. Lajoyce Cornersuda or Denny PeonErin about this pt. Please call [phone]

## 2017-09-22 NOTE — Telephone Encounter (Signed)
IC advised.  

## 2017-09-22 NOTE — Telephone Encounter (Signed)
His knee has significant end stage arthritis present.  I think he does have to do the best that she can with what you have in this patient in terms of strengthening and that's all we can do.  He may or may not want to use a brace.pls clal thx

## 2017-12-11 ENCOUNTER — Ambulatory Visit (INDEPENDENT_AMBULATORY_CARE_PROVIDER_SITE_OTHER): Payer: Medicare Other | Admitting: Orthopedic Surgery

## 2017-12-11 ENCOUNTER — Encounter (INDEPENDENT_AMBULATORY_CARE_PROVIDER_SITE_OTHER): Payer: Self-pay | Admitting: Orthopedic Surgery

## 2017-12-11 DIAGNOSIS — M1712 Unilateral primary osteoarthritis, left knee: Secondary | ICD-10-CM | POA: Diagnosis not present

## 2017-12-11 DIAGNOSIS — M1711 Unilateral primary osteoarthritis, right knee: Secondary | ICD-10-CM

## 2017-12-13 ENCOUNTER — Encounter (INDEPENDENT_AMBULATORY_CARE_PROVIDER_SITE_OTHER): Payer: Self-pay | Admitting: Orthopedic Surgery

## 2017-12-13 NOTE — Progress Notes (Signed)
Office Visit Note   Patient: Jonathan Le           Date of Birth: 22-Jan-1946           MRN: [id number] Visit Date: 12/11/2017 Requested by: Elizabeth Palau, FNP [address] [name], [place] [address] PCP: Elizabeth Palau, FNP  Subjective: Chief Complaint  Patient presents with  . Right Knee - Pain, Follow-up  . Left Knee - Pain, Follow-up    HPI: Jonathan Le is a patient with right lower extremity swelling.  He has a known history of bilateral knee arthritis.  He developed some cellulitis in the right lower extremity around Christmas.  He still has a wound on that distal left leg which that this is the leg where his saphenous vein was harvested.  He was placed on antibiotics for 14 days.  He states that compression hose are too tight for him.  He would like to continue therapy for his knees.  He is not currently having any fevers or chills.  He is currently walking some with a walker without assistance.  This is a significant improvement for him.  By his history his wife's level of support at home is variable.              ROS: All systems reviewed are negative as they relate to the chief complaint within the history of present illness.  Patient denies  fevers or chills.   Assessment & Plan: Visit Diagnoses:  1. Unilateral primary osteoarthritis, left knee   2. Unilateral primary osteoarthritis, right knee     Plan: Pression is right lower extremity edema which would do well with compression.  I want to continue physical therapy for him to times a week for 8 more weeks.  He also needs to see Dr. due to in 3 weeks for impression treatment.  Did write him a prescription for 30/40 socks to be worn and purchased to decrease swelling.  He will follow-up with Dr. due to in 3 weeks to judge the efficacy of that intervention.  Follow-Up Instructions: No Follow-up on file.   Orders:  No orders of the defined types were placed in this encounter.  No orders of the defined  types were placed in this encounter.     Procedures: No procedures performed   Clinical Data: No additional findings.  Objective: Vital Signs: There were no vitals taken for this visit.  Physical Exam:   Constitutional: Patient appears well-developed HEENT:  Head: Normocephalic Eyes:EOM are normal Neck: Normal range of motion Cardiovascular: Normal rate Pulmonary/chest: Effort normal Neurologic: Patient is alert Skin: Skin is warm Psychiatric: Patient has normal mood and affect    Ortho Exam: Orthopedic examination demonstrates venous stasis bilateral lower extremities right worse than left.  Pedal pulses palpable.  Dime size ulcer mid tibia which does not have fluctuance or current drainage.  Bilateral knees have no effusion but reasonable range of motion and they can bend each to about 90 degrees.  Specialty Comments:  No specialty comments available.  Imaging: No results found.   PMFS History: Patient Active Problem List   Diagnosis Date Noted  . History of DVT (deep vein thrombosis)   . Long-term (current) use of anticoagulants   . Old anterior myocardial infarction   . Sleep apnea   . Anemia of chronic disease 10/16/2015  . Chronic combined systolic and diastolic congestive heart failure (HCC) 09/25/2015  . Chronic respiratory failure (HCC) 09/17/2015  . S/P CABG x 3  05/22/2015  . Ischemic cardiomyopathy   . CAD (coronary artery disease), native coronary artery   . Morbid obesity (HCC) 05/20/2015  . Benign prostatic hyperplasia with urinary obstruction 04/24/2015  . Chronic LBP 12/05/2013  . Essential hypertension 09/04/2013   Past Medical History:  Diagnosis Date  . Adult situational stress disorder   . Anemia   . Anxiety   . Arthritis   . CHF (congestive heart failure) (HCC)    MI, with open heart surgery x 30 sdays ago per pt. (06/26/15)  . Chronic low back pain   . Decubital ulcer   . DVT (deep venous thrombosis) (HCC)   . ED (erectile  dysfunction)   . ETD (eustachian tube dysfunction)   . Ganglion cyst   . GERD (gastroesophageal reflux disease)   . Glaucoma   . Hypertension   . Ileostomy in place [facility](HCC)   . MI (myocardial infarction) (HCC)   . Osteoarthritis   . S/P CABG (coronary artery bypass graft)   . Sleep apnea    wears CPAP nightly    Family History  Problem Relation Age of Onset  . Alzheimer's disease Father   . Rheum arthritis Mother     Past Surgical History:  Procedure Laterality Date  . CARDIAC CATHETERIZATION N/A 05/20/2015   Procedure: Left Heart Cath and Coronary Angiography;  Surgeon: Kathleene Hazelhristopher D McAlhany, MD;  [facility];  Service: Cardiovascular;  Laterality: N/A;  . CORONARY ARTERY BYPASS GRAFT N/A 05/22/2015   Procedure: CORONARY ARTERY BYPASS GRAFTING (CABG), ON PUMP, TIMES THREE, USING LEFT INTERNAL MAMMARY ARTERY, RIGHT GREATER SAPHENOUS VEIN HARVESTED ENDOSCOPICALLY;  Surgeon: Kerin PernaPeter Van Trigt, MD;  [facility];  Service: Open Heart Surgery;  Laterality: N/A;  LIMA-LAD, SVG-OM, SVG-PD  . EYE SURGERY     bil cataract  . FASCIOTOMY FOOT / TOE Left    foot  . HERNIA REPAIR     right  . LAPAROTOMY N/A 06/18/2015   Procedure: EXPLORATORY LAPAROTOMY;  Surgeon: Emelia LoronMatthew Wakefield, MD;  [facility];  Service: General;  Laterality: N/A;  . LAPAROTOMY N/A 06/20/2015   Procedure: WASHOUT OF ABDOMEN, CLOSURE OF ABDOMINAL WALL WITH PLACEMENT OF WOUND VAC AND ILLEOSTOMY ;  Surgeon: Almond LintFaera Byerly, MD;  Location: [facility] OR;  Service: General;  Laterality: N/A;  . TEE WITHOUT CARDIOVERSION N/A 05/22/2015   Procedure: TRANSESOPHAGEAL ECHOCARDIOGRAM (TEE);  Surgeon: Kerin PernaPeter Van Trigt, MD;  [facility];  Service: Open Heart Surgery;  Laterality: N/A;  . TRACHEOSTOMY TUBE PLACEMENT N/A 07/03/2015   Procedure: TRACHEOSTOMY;  Surgeon: Serena ColonelJefry Rosen, MD;  [facility];  Service: ENT;  Laterality: N/A;  . WISDOM TOOTH EXTRACTION     Social History   Occupational History  . Not on file    Tobacco Use  . Smoking status: Never Smoker  . Smokeless tobacco: Never Used  Substance and Sexual Activity  . Alcohol use: No    Alcohol/week: 0.0 oz  . Drug use: No  . Sexual activity: Not Currently

## 2018-01-01 ENCOUNTER — Ambulatory Visit (INDEPENDENT_AMBULATORY_CARE_PROVIDER_SITE_OTHER): Payer: Medicare Other | Admitting: Orthopedic Surgery

## 2018-06-14 DIAGNOSIS — K435 Parastomal hernia without obstruction or  gangrene: Secondary | ICD-10-CM

## 2018-06-14 HISTORY — DX: Parastomal hernia without obstruction or gangrene: K43.5

## 2018-09-19 ENCOUNTER — Telehealth: Payer: Self-pay | Admitting: Cardiology

## 2018-09-26 NOTE — Telephone Encounter (Signed)
done

## 2018-10-31 ENCOUNTER — Ambulatory Visit: Payer: Medicare Other | Admitting: Cardiology

## 2018-11-22 ENCOUNTER — Encounter: Payer: Self-pay | Admitting: Cardiology

## 2018-11-22 ENCOUNTER — Ambulatory Visit (INDEPENDENT_AMBULATORY_CARE_PROVIDER_SITE_OTHER): Payer: Medicare Other | Admitting: Cardiology

## 2018-11-22 VITALS — BP 108/60 | HR 64 | Ht 69.0 in | Wt 280.8 lb

## 2018-11-22 DIAGNOSIS — I2510 Atherosclerotic heart disease of native coronary artery without angina pectoris: Secondary | ICD-10-CM

## 2018-11-22 DIAGNOSIS — Z951 Presence of aortocoronary bypass graft: Secondary | ICD-10-CM

## 2018-11-22 DIAGNOSIS — I255 Ischemic cardiomyopathy: Secondary | ICD-10-CM | POA: Diagnosis not present

## 2018-11-22 DIAGNOSIS — I10 Essential (primary) hypertension: Secondary | ICD-10-CM

## 2018-11-22 DIAGNOSIS — Z86718 Personal history of other venous thrombosis and embolism: Secondary | ICD-10-CM

## 2018-11-22 NOTE — Progress Notes (Signed)
Cardiology Office Note:    Date:  11/22/2018   ID:  Jonathan Le, DOB 1946/01/25, MRN [id number]  PCP:  Elizabeth Palau, FNP  Cardiologist:  Gypsy Balsam, MD    Referring MD: Elizabeth Palau, FNP   Chief Complaint  Patient presents with  . Follow-up  Doing well  History of Present Illness:    Jonathan Le is a 73 y.o. male with complex past medical history.  That include coronary artery bypass graft done in July 2016 with LIMA to LAD SVG to obtuse marginal SVG to RCA.  History of ischemic cardiomyopathy with ejection fraction 30% last estimation in June 2018, struggling with chronic wound in the right lower extremities follow-up in wound care.  Described to have chronic swelling of lower extremities but lately that seems to be under control.  He does not have defibrillator however he did have discussion about this already.  He is anticoagulated because of history of DVT with embolization.  He is also morbidly obese does have sleep apnea.  Overall seems to be doing well biggest problem he have his problem with his right lower extremity with a wound that is taking care of by wound care.  Can walk however with some difficulties he cannot walk from front to the back of [employer].  Past Medical History:  Diagnosis Date  . Adult situational stress disorder   . Anemia   . Anxiety   . Arthritis   . CHF (congestive heart failure) (HCC)    MI, with open heart surgery x 30 sdays ago per pt. (06/26/15)  . Chronic low back pain   . Decubital ulcer   . DVT (deep venous thrombosis) (HCC)   . ED (erectile dysfunction)   . ETD (eustachian tube dysfunction)   . Ganglion cyst   . GERD (gastroesophageal reflux disease)   . Glaucoma   . Hypertension   . Ileostomy in place [facility])   . MI (myocardial infarction) (HCC)   . Osteoarthritis   . S/P CABG (coronary artery bypass graft)   . Sleep apnea    wears CPAP nightly    Past Surgical History:  Procedure Laterality Date  . CARDIAC  CATHETERIZATION N/A 05/20/2015   Procedure: Left Heart Cath and Coronary Angiography;  Surgeon: Kathleene Hazel, MD;  Location: [facility] INVASIVE CV LAB;  Service: Cardiovascular;  Laterality: N/A;  . CORONARY ARTERY BYPASS GRAFT N/A 05/22/2015   Procedure: CORONARY ARTERY BYPASS GRAFTING (CABG), ON PUMP, TIMES THREE, USING LEFT INTERNAL MAMMARY ARTERY, RIGHT GREATER SAPHENOUS VEIN HARVESTED ENDOSCOPICALLY;  Surgeon: Kerin Perna, MD;  Location[facility] OR;  Service: Open Heart Surgery;  Laterality: N/A;  LIMA-LAD, SVG-OM, SVG-PD  . EYE SURGERY     bil cataract  . FASCIOTOMY FOOT / TOE Left    foot  . HERNIA REPAIR     right  . LAPAROTOMY N/A 06/18/2015   Procedure: EXPLORATORY LAPAROTOMY;  Surgeon: Emelia Loron, MD;  Location: [facility] OR;  Service: General;  Laterality: N/A;  . LAPAROTOMY N/A 06/20/2015   Procedure: WASHOUT OF ABDOMEN, CLOSURE OF ABDOMINAL WALL WITH PLACEMENT OF WOUND VAC AND ILLEOSTOMY ;  Surgeon: Almond Lint, MD;  Location: [facility] OR;  Service: General;  Laterality: N/A;  . TEE WITHOUT CARDIOVERSION N/A 05/22/2015   Procedure: TRANSESOPHAGEAL ECHOCARDIOGRAM (TEE);  Surgeon: Kerin Perna, MD;  Location: [facility] OR;  Service: Open Heart Surgery;  Laterality: N/A;  . TRACHEOSTOMY TUBE PLACEMENT N/A 07/03/2015   Procedure: TRACHEOSTOMY;  Surgeon: Serena Colonel, MD;  Location: [facility]  OR;  Service: ENT;  Laterality: N/A;  . WISDOM TOOTH EXTRACTION      Current Medications: Current Meds  Medication Sig  . dorzolamide-timolol (COSOPT) 22.3-6.8 MG/ML ophthalmic solution Place 1 drop into the left eye 2 (two) times daily.  . finasteride (PROSCAR) 5 MG tablet Take 1 tablet by mouth daily.  . furosemide (LASIX) 40 MG tablet Take 1 tablet by mouth 2 (two) times daily.  Marland Kitchen. HYDROcodone-acetaminophen (NORCO/VICODIN) 5-325 MG tablet Take 1 tablet by mouth every 6 (six) hours as needed for moderate pain.  . metoprolol succinate (TOPROL-XL) 25 MG 24 hr tablet Take 0.5 tablets by mouth daily.  . Multiple  Vitamins-Minerals (DECUBI-VITE PO) Take 1 tablet by mouth daily.  . potassium chloride SA (K-DUR,KLOR-CON) 20 MEQ tablet Take 20 mEq by mouth. 2 tablets in the morning and 1 and 1/2 tablets in the evening  . warfarin (COUMADIN) 5 MG tablet Take 1 tablet (5 mg total) by mouth daily at 6 PM.     Allergies:   Unasyn [ampicillin-sulbactam sodium]; Levaquin [levofloxacin]; Penicillins; and Doxycycline   Social History   Socioeconomic History  . Marital status: Married    Spouse name: Not on file  . Number of children: Not on file  . Years of education: Not on file  . Highest education level: Not on file  Occupational History  . Not on file  Social Needs  . Financial resource strain: Not on file  . Food insecurity:    Worry: Not on file    Inability: Not on file  . Transportation needs:    Medical: Not on file    Non-medical: Not on file  Tobacco Use  . Smoking status: Never Smoker  . Smokeless tobacco: Never Used  Substance and Sexual Activity  . Alcohol use: No    Alcohol/week: 0.0 standard drinks  . Drug use: No  . Sexual activity: Not Currently  Lifestyle  . Physical activity:    Days per week: Not on file    Minutes per session: Not on file  . Stress: Not on file  Relationships  . Social connections:    Talks on phone: Not on file    Gets together: Not on file    Attends religious service: Not on file    Active member of club or organization: Not on file    Attends meetings of clubs or organizations: Not on file    Relationship status: Not on file  Other Topics Concern  . Not on file  Social History Narrative  . Not on file     Family History: The patient's family history includes Alzheimer's disease in his father; Rheum arthritis in his mother. ROS:   Please see the history of present illness.    All 14 point review of systems negative except as described per history of present illness  EKGs/Labs/Other Studies Reviewed:      Recent Labs: No results found  for requested labs within last 8760 hours.  Recent Lipid Panel    Component Value Date/Time   CHOL 121 05/20/2015 0110   TRIG 137 06/30/2015 2018   HDL 40 (L) 05/20/2015 0110   CHOLHDL 3.0 05/20/2015 0110   VLDL 12 05/20/2015 0110   LDLCALC 69 05/20/2015 0110    Physical Exam:    VS:  BP 108/60   Pulse 64   Ht 5\' 9"  (1.753 m)   Wt 280 lb 12.8 oz (127.4 kg)   SpO2 96%   BMI 41.47 kg/m  Wt Readings from Last 3 Encounters:  11/22/18 280 lb 12.8 oz (127.4 kg)  11/23/15 242 lb 4.6 oz (109.9 kg)  10/30/15 257 lb 15 oz (117 kg)     GEN:  Well nourished, well developed in no acute distress HEENT: Normal NECK: No JVD; No carotid bruits LYMPHATICS: No lymphadenopathy CARDIAC: RRR, no murmurs, no rubs, no gallops RESPIRATORY:  Clear to auscultation without rales, wheezing or rhonchi  ABDOMEN: Soft, non-tender, non-distended MUSCULOSKELETAL:  No edema; No deformity  SKIN: Warm and dry LOWER EXTREMITIES: no swelling NEUROLOGIC:  Alert and oriented x 3 PSYCHIATRIC:  Normal affect   ASSESSMENT:    1. Coronary artery disease involving native coronary artery of native heart without angina pectoris   2. Ischemic cardiomyopathy   3. Essential hypertension   4. S/P CABG x 3   5. History of DVT (deep vein thrombosis)    PLAN:    In order of problems listed above:  1. Coronary artery disease.  Stable denies have any chest pain tightness squeezing pressure burning chest.  On appropriate medications which I will continue. 2. Ischemic cardiomyopathy last estimation June 2018 with ejection fraction of 30%.  I will ask him to have repeated echocardiogram so we can recheck left ventricular ejection fraction.  I also initiated conversation about ICD he heard about it but he thinks he does not need it I expressed to him the reason for defibrillator he said he will think it over. 3. History of DVT with some embolization.  He is being anticoagulated Coumadin I start talking to her about  potentially changing to newer agent he said he will think it over. 4. Coronary artery disease with bypass surgery his last LDL that I have available is from 2018 summertime and his LDL was actually 75 I told him that the target LDL for him should be less than 70 or even more according to newer guidelines target should not be there he simply need to be on high intensity statin.  He is reluctant to start it he said his primary care physician just recently check his cholesterol told him everything is fine and he does not think he needs cholesterol medication.  I told him in his situation that medication is critically essential.  We will continue this conversation.   Medication Adjustments/Labs and Tests Ordered: Current medicines are reviewed at length with the patient today.  Concerns regarding medicines are outlined above.  No orders of the defined types were placed in this encounter.  Medication changes: No orders of the defined types were placed in this encounter.   Signed, Georgeanna Leaobert J. Heiress Williamson, MD, [facility] 11/22/2018 3:00 PM    [facility]

## 2018-11-22 NOTE — Patient Instructions (Addendum)
Medication Instructions:  Your physician recommends that you continue on your current medications as directed. Please refer to the Current Medication list given to you today.   If you need a refill on your cardiac medications before your next appointment, please call your pharmacy.   Lab work: None.  If you have labs (blood work) drawn today and your tests are completely normal, you will receive your results only by: Marland Kitchen. MyChart Message (if you have MyChart) OR . A paper copy in the mail If you have any lab test that is abnormal or we need to change your treatment, we will call you to review the results.  Testing/Procedures: Your physician has requested that you have an echocardiogram. Echocardiography is a painless test that uses sound waves to create images of your heart. It provides your doctor with information about the size and shape of your heart and how well your heart's chambers and valves are working. This procedure takes approximately one hour. There are no restrictions for this procedure.  Your physician has recommended that you wear a holter monitor. Holter monitors are medical devices that record the heart's electrical activity. Doctors most often use these monitors to diagnose arrhythmias. Arrhythmias are problems with the speed or rhythm of the heartbeat. The monitor is a small, portable device. You can wear one while you do your normal daily activities. This is usually used to diagnose what is causing palpitations/syncope (passing out). Wear for 24 hours.     Follow-Up: At [facility], you and your health needs are our priority.  As part of our continuing mission to provide you with exceptional heart care, we have created designated Provider Care Teams.  These Care Teams include your primary Cardiologist (physician) and Advanced Practice Providers (APPs -  Physician Assistants and Nurse Practitioners) who all work together to provide you with the care you need, when you need  it. You will need a follow up appointment in 6 weeks.  Please call our office 2 months in advance to schedule this appointment.  You may see No primary care provider on file. or another member of our [facility] Provider Team in : Norman HerrlichBrian Munley, MD . Belva Cromeajan Revankar, MD  Any Other Special Instructions Will Be Listed Below (If Applicable).   Ambulatory Cardiac Monitoring An ambulatory cardiac monitor is a small recording device that is used to detect abnormal heart rhythms (arrhythmias). Most monitors are connected by wires to flat, sticky disks (electrodes) that are then attached to your chest. You may need to wear a monitor if you have had symptoms such as:  Fast heartbeats (palpitations).  Dizziness.  Fainting or light-headedness.  Unexplained weakness.  Shortness of breath. There are several types of monitors. Some common monitors include:  Holter monitor. This records your heart rhythm continuously, usually for 24-48 hours.  Event (episodic) monitor. This monitor has a symptoms button, and when pushed, it will begin recording. You need to activate this monitor to record when you have a heart-related symptom.  Automatic detection monitor. This monitor will begin recording when it detects an abnormal heartbeat. What are the risks? Generally, these devices are safe to use. However, it is possible that the skin under the electrodes will become irritated. How to prepare for monitoring Your health care provider will prepare your chest for the electrode placement and show you how to use the monitor.  Do not apply lotions to your chest before monitoring.  Follow directions on how to care for the monitor, and how to  return the monitor when the testing period is complete. How to use your cardiac monitor  Follow directions about how long to wear the monitor, and if you can take the monitor off in order to shower or bathe. ? Do not let the monitor get wet. ? Do not bathe,  swim, or use a hot tub while wearing the monitor.  Keep your skin clean. Do not put body lotion or moisturizer on your chest.  Change the electrodes as told by your health care provider, or any time they stop sticking to your skin. You may need to use medical tape to keep them on.  Try to put the electrodes in slightly different places on your chest to help prevent skin irritation. Follow directions from your health care provider about where to place the electrodes.  Make sure the monitor is safely clipped to your clothing or in a location close to your body as recommended by your health care provider.  If your monitor has a symptoms button, press the button to mark an event as soon as you feel a heart-related symptom, such as: ? Dizziness. ? Weakness. ? Light-headedness. ? Palpitations. ? Thumping or pounding in your chest. ? Shortness of breath. ? Unexplained weakness.  Keep a diary of your activities, such as walking, doing chores, and taking medicine. It is very important to note what you were doing when you pushed the button to record your symptoms. This will help your health care provider determine what might be contributing to your symptoms.  Send the recorded information as recommended by your health care provider. It may take some time for your health care provider to process the results.  Change the batteries as told by your health care provider.  Keep electronic devices away from your monitor. These include: ? Tablets. ? MP3 players. ? Cell phones.  While wearing your monitor you should avoid: ? Electric blankets. ? Firefighter. ? Electric toothbrushes. ? Microwave ovens. ? Magnets. ? Metal detectors. Get help right away if:  You have chest pain.  You have shortness of breath or extreme difficulty breathing.  You develop a very fast heartbeat that does not get better.  You develop dizziness that does not go away.  You faint or constantly feel like you are  about to faint. Summary  An ambulatory cardiac monitor is a small recording device that is used to detect abnormal heart rhythms (arrhythmias).  Make sure you understand how to send the information from the monitor to your health care provider.  It is important to press the button on the monitor when you have any heart-related symptoms.  Keep a diary of your activities, such as walking, doing chores, and taking medicine. It is very important to note what you were doing when you pushed the button to record your symptoms. This will help your health care provider learn what might be causing your symptoms. This information is not intended to replace advice given to you by your health care provider. Make sure you discuss any questions you have with your health care provider. Document Released: 08/09/2008 Document Revised: 08/16/2017 Document Reviewed: 10/15/2016 Elsevier Interactive Patient Education  2019 [employer].   Echocardiogram An echocardiogram is a procedure that uses painless sound waves (ultrasound) to produce an image of the heart. Images from an echocardiogram can provide important information about:  Signs of coronary artery disease (CAD).  Aneurysm detection. An aneurysm is a weak or damaged part of an artery wall that bulges out from  the normal force of blood pumping through the body.  Heart size and shape. Changes in the size or shape of the heart can be associated with certain conditions, including heart failure, aneurysm, and CAD.  Heart muscle function.  Heart valve function.  Signs of a past heart attack.  Fluid buildup around the heart.  Thickening of the heart muscle.  A tumor or infectious growth around the heart valves. Tell a health care provider about:  Any allergies you have.  All medicines you are taking, including vitamins, herbs, eye drops, creams, and over-the-counter medicines.  Any blood disorders you have.  Any surgeries you have had.  Any  medical conditions you have.  Whether you are pregnant or may be pregnant. What are the risks? Generally, this is a safe procedure. However, problems may occur, including:  Allergic reaction to dye (contrast) that may be used during the procedure. What happens before the procedure? No specific preparation is needed. You may eat and drink normally. What happens during the procedure?   An IV tube may be inserted into one of your veins.  You may receive contrast through this tube. A contrast is an injection that improves the quality of the pictures from your heart.  A gel will be applied to your chest.  A wand-like tool (transducer) will be moved over your chest. The gel will help to transmit the sound waves from the transducer.  The sound waves will harmlessly bounce off of your heart to allow the heart images to be captured in real-time motion. The images will be recorded on a computer. The procedure may vary among health care providers and hospitals. What happens after the procedure?  You may return to your normal, everyday life, including diet, activities, and medicines, unless your health care provider tells you not to do that. Summary  An echocardiogram is a procedure that uses painless sound waves (ultrasound) to produce an image of the heart.  Images from an echocardiogram can provide important information about the size and shape of your heart, heart muscle function, heart valve function, and fluid buildup around your heart.  You do not need to do anything to prepare before this procedure. You may eat and drink normally.  After the echocardiogram is completed, you may return to your normal, everyday life, unless your health care provider tells you not to do that. This information is not intended to replace advice given to you by your health care provider. Make sure you discuss any questions you have with your health care provider. Document Released: 10/28/2000 Document  Revised: 12/03/2016 Document Reviewed: 12/03/2016 Elsevier Interactive Patient Education  2019 [employer].

## 2018-11-30 ENCOUNTER — Encounter: Payer: Self-pay | Admitting: *Deleted

## 2018-11-30 ENCOUNTER — Ambulatory Visit (INDEPENDENT_AMBULATORY_CARE_PROVIDER_SITE_OTHER): Payer: Medicare Other

## 2018-11-30 ENCOUNTER — Other Ambulatory Visit: Payer: Self-pay | Admitting: Cardiology

## 2018-11-30 DIAGNOSIS — I10 Essential (primary) hypertension: Secondary | ICD-10-CM | POA: Diagnosis not present

## 2018-11-30 DIAGNOSIS — I2510 Atherosclerotic heart disease of native coronary artery without angina pectoris: Secondary | ICD-10-CM

## 2018-11-30 DIAGNOSIS — R001 Bradycardia, unspecified: Secondary | ICD-10-CM

## 2018-11-30 DIAGNOSIS — I255 Ischemic cardiomyopathy: Secondary | ICD-10-CM

## 2018-12-13 ENCOUNTER — Telehealth: Payer: Self-pay | Admitting: Cardiology

## 2018-12-13 NOTE — Telephone Encounter (Signed)
Patient and patient's wife informed of results

## 2018-12-13 NOTE — Telephone Encounter (Signed)
Wants monitor results °

## 2019-01-03 ENCOUNTER — Ambulatory Visit (INDEPENDENT_AMBULATORY_CARE_PROVIDER_SITE_OTHER): Payer: Medicare Other

## 2019-01-03 ENCOUNTER — Other Ambulatory Visit: Payer: Medicare Other

## 2019-01-03 DIAGNOSIS — I255 Ischemic cardiomyopathy: Secondary | ICD-10-CM

## 2019-01-03 MED ORDER — PERFLUTREN LIPID MICROSPHERE: 1.0000 mL | INTRAVENOUS | Status: AC | PRN

## 2019-01-03 NOTE — Progress Notes (Unsigned)
Complete echocardiogram with contrast has been performed.   Jimmy Randen Kauth RDCS, RVT 

## 2019-01-08 ENCOUNTER — Telehealth: Payer: Self-pay | Admitting: Cardiology

## 2019-01-08 NOTE — Telephone Encounter (Signed)
Left message for patient to return call.

## 2019-01-08 NOTE — Telephone Encounter (Signed)
Patient informed of results and advised of upcoming appointment.

## 2019-01-08 NOTE — Telephone Encounter (Signed)
Returned call about echo results °

## 2019-01-10 ENCOUNTER — Ambulatory Visit (INDEPENDENT_AMBULATORY_CARE_PROVIDER_SITE_OTHER): Payer: Medicare Other | Admitting: Cardiology

## 2019-01-10 ENCOUNTER — Telehealth: Payer: Self-pay

## 2019-01-10 VITALS — BP 112/68 | HR 55 | Ht 69.0 in | Wt 279.6 lb

## 2019-01-10 DIAGNOSIS — I2510 Atherosclerotic heart disease of native coronary artery without angina pectoris: Secondary | ICD-10-CM | POA: Diagnosis not present

## 2019-01-10 DIAGNOSIS — I255 Ischemic cardiomyopathy: Secondary | ICD-10-CM | POA: Diagnosis not present

## 2019-01-10 DIAGNOSIS — Z951 Presence of aortocoronary bypass graft: Secondary | ICD-10-CM | POA: Diagnosis not present

## 2019-01-10 DIAGNOSIS — I10 Essential (primary) hypertension: Secondary | ICD-10-CM | POA: Diagnosis not present

## 2019-01-10 MED ORDER — SACUBITRIL-VALSARTAN 24-26 MG PO TABS: 1.0000 | ORAL_TABLET | Freq: Two times a day (BID) | ORAL | Status: AC

## 2019-01-10 NOTE — Patient Instructions (Signed)
Medication Instructions:  Your physician recommends that you continue on your current medications as directed. Please refer to the Current Medication list given to you today.  If you need a refill on your cardiac medications before your next appointment, please call your pharmacy.   Lab work: NONE Testing/Procedures: You are being referred to an electrophysiologist named Dr. Elberta Fortis. You will schedule this consultation at the front desk today.  Follow-Up: At [facility], you and your health needs are our priority.  As part of our continuing mission to provide you with exceptional heart care, we have created designated Provider Care Teams.  These Care Teams include your primary Cardiologist (physician) and Advanced Practice Providers (APPs -  Physician Assistants and Nurse Practitioners) who all work together to provide you with the care you need, when you need it. You will need a follow up appointment in 1 months.   Any Other Special Instructions Will Be Listed Below    Electrophysiology Study An electrophysiology (EP) study is a heart test in which thin, flexible tubes (catheters) are placed in a large vein in your groin, arm, neck, or chest. This test is done to evaluate the electrical conduction system of your heart. You may need this test if you have:  Dizziness or fainting.  A fast heartbeat (tachycardia).  A slow heartbeat (bradycardia).  An irregular heartbeat (arrhythmia), such as atrial fibrillation. Tell a health care provider about:  Any allergies you have.  All medicines you are taking, including vitamins, herbs, eye drops, creams, and over-the-counter medicines.  Any problems you or family members have had with anesthetic medicines.  Any blood disorders you have.  Any surgeries you have had.  Any medical conditions you have.  Whether you are pregnant or may be pregnant. What are the risks? Generally, this is a safe procedure. However, problems may occur,  including:  Tachycardia that does not go away.  Bleeding or bruising around the insertion sites.  Infection.  Temporary or permanent heart rhythm abnormalities.  Temporary changes in blood pressure.  Puncture (perforation) of the heart wall or a blood vessel. This can cause bleeding between the heart and the sac that surrounds it (cardiac tamponade).  Possible cardiac arrest or fatal heart arrhythmia.  Allergic reactions to medicines or dyes.  Damage to other structures or organs. What happens before the procedure? Staying hydrated Follow instructions from your health care provider about hydration, which may include:  Up to 2 hours before the procedure - you may continue to drink clear liquids, such as water, clear fruit juice, black coffee, and plain tea. Eating and drinking restrictions Follow instructions from your health care provider about eating and drinking, which may include:  8 hours before the procedure - stop eating heavy meals or foods such as meat, fried foods, or fatty foods.  6 hours before the procedure - stop eating light meals or foods, such as toast or cereal.  6 hours before the procedure - stop drinking milk or drinks that contain milk.  2 hours before the procedure - stop drinking clear liquids. Medicines  Ask your health care provider about: ? Changing or stopping your regular medicines. This is especially important if you are taking diabetes medicines or blood thinners. ? Taking medicines such as aspirin and ibuprofen. These medicines can thin your blood. Do not take these medicines before your procedure if your health care provider instructs you not to.  You may be given antibiotic medicine to help prevent infection. General instructions  Plan to  have someone take you home from the hospital or clinic.  If you will be going home right after the procedure, plan to have someone with you for 24 hours.  Ask your health care provider how your surgical  site will be marked or identified. What happens during the procedure?  To lower your risk of infection: ? Your health care team will wash or sanitize their hands. ? Your skin will be washed with soap. ? Hair may be removed from the surgical area.  An IV tube will be inserted into one of your veins.  You will be given one or more of the following: ? A medicine to help you relax (sedative). ? A medicine to numb the area (local anesthetic). ? A medicine to make you fall asleep (general anesthetic).  Catheters with an electrode tip will be inserted into a large vein. These electrode tips can measure the heart's electrical activity. They can also use electrical signals to change the heart rhythm.  The catheters will be guided to the heart using a type of X-ray machine (fluoroscopy). Once the catheters are in the heart, they will evaluate the electrical activity of your heart.  If you are awake during the EP study, you may feel dizzy or light-headed. Your heart rate may temporarily increase or you may feel your heart beating hard. Tell your health care provider if you experience these things during the EP study: ? You feel dizzy or nauseous. ? You have chest pain or pressure.  The catheters will be removed.  Firm pressure will be applied to the insertion sites to prevent bleeding.  A bandage (dressing) may be applied over the insertion sites. The procedure may vary among health care providers and hospitals. What happens after the procedure?   Do not drive for 24 hours if you were given a sedative.  Your blood pressure, heart rate, breathing rate, and blood oxygen level will be monitored until the medicines you were given have worn off.  You will need to lie flat for a few hours or as told by your health care provider. Keep your legs straight. Do not bend or cross your legs. This information is not intended to replace advice given to you by your health care provider. Make sure you  discuss any questions you have with your health care provider. Document Released: 04/20/2010 Document Revised: 06/03/2016 Document Reviewed: 05/09/2016 Elsevier Interactive Patient Education  2019 [employer].

## 2019-01-10 NOTE — Progress Notes (Signed)
Cardiology Office Note:    Date:  01/10/2019   ID:  Jonathan Le, DOB January 13, 1946, MRN [id number]  PCP:  Elizabeth Palau, FNP  Cardiologist:  Gypsy Balsam, MD    Referring MD: Elizabeth Palau, FNP   Chief Complaint  Patient presents with  . 6 week follow up  Doing very well  History of Present Illness:    Jonathan Le is a 73 y.o. male with ischemic cardiomyopathy ejection fraction 30% based on last echocardiogram also history of coronary artery bypass graft.  He got ongoing wound in the right lower extremities.  I repeat his echocardiogram which showed still diminished left ventricular ejection fraction.  He is echocardiogram also showing extensive intraventricular conduction delay we continue discussion about ICD/CRT.  He would like to be seen by EP to start discussion regarding that issue what complicates the situation right now is the fact that his medical therapies not optimal and is difficult to put him on those medications I will try to check his kidney function at least to get results of recent check of her kidney function if kidney function was normal all close to being normal and will initiate Entresto twice daily.  He is already on very small dose of beta-blocker.  There will be also some role for Aldactone.  We will need to put him on all good medications then to see if his left ventricular ejection fraction improves I still think we will end up doing ICD/CRT on him.  Past Medical History:  Diagnosis Date  . Acute deep vein thrombosis (DVT) of lower extremity (HCC) 02/02/2016  . Adult situational stress disorder   . Anemia of chronic disease 10/16/2015  . Anxiety   . Arthritis   . Benign prostatic hyperplasia with urinary obstruction 04/24/2015  . CAD (coronary artery disease), native coronary artery    CABG with LIMA to LAD, SVG to OM, SVG to RCA 05/22/15 Dr. Alla German  Cath July 16 normal LM, 100% LAD, 95% distal RCA, 90% OM2     . CHF (congestive heart failure) (HCC)     MI, with open heart surgery x 30 sdays ago per pt. (06/26/15)  . Chronic combined systolic and diastolic congestive heart failure (HCC) 09/25/2015  . Chronic LBP 12/05/2013   Overview:  M Hilts, MD/Controlled substance agreement on chart   . Chronic respiratory failure (HCC) 09/17/2015  . Decubital ulcer   . Degenerative arthritis of knee, bilateral 12/05/2013   Overview:  Dr. Prince Rome  . DVT (deep venous thrombosis) (HCC)   . ED (erectile dysfunction)   . Essential hypertension 09/04/2013  . ETD (eustachian tube dysfunction)   . Ganglion cyst of wrist 03/06/2014  . Gastroesophageal reflux disease with esophagitis 02/02/2016  . GERD (gastroesophageal reflux disease)   . Glaucoma   . History of DVT (deep vein thrombosis)   . Hypertension   . Ileostomy in place [facility])   . Impaired fasting glucose 12/04/2013  . Ischemic cardiomyopathy   . Long-term (current) use of anticoagulants   . MI (myocardial infarction) (HCC)   . Morbid obesity (HCC) 05/20/2015  . Normal prostate specific antigen (PSA) level 03/14/2014   Overview:  PSA 02/2014 3.7         07/2013   3.9         11/2007 3.14  . Old anterior myocardial infarction   . Osteoarthritis   . Parastomal hernia without obstruction or gangrene 06/14/2018  . Physical deconditioning 02/02/2016  . Right shoulder tendonitis 08/29/2016  .  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 Sleep apnea    wears CPAP nightly  . Spondylosis 12/05/2013    Past Surgical History:  Procedure Laterality Date  . CARDIAC CATHETERIZATION N/A 05/20/2015   Procedure: Left Heart Cath and Coronary Angiography;  Surgeon: Kathleene Hazel, MD;  Location: [facility] INVASIVE CV LAB;  Service: Cardiovascular;  Laterality: N/A;  . CORONARY ARTERY BYPASS GRAFT N/A 05/22/2015   Procedure: CORONARY ARTERY BYPASS GRAFTING (CABG), ON PUMP, TIMES THREE, USING LEFT INTERNAL MAMMARY ARTERY, RIGHT GREATER SAPHENOUS VEIN HARVESTED ENDOSCOPICALLY;  Surgeon: Kerin Perna, MD;  Location: [facility] OR;  Service: Open  Heart Surgery;  Laterality: N/A;  LIMA-LAD, SVG-OM, SVG-PD  . EYE SURGERY     bil cataract  . FASCIOTOMY FOOT / TOE Left    foot  . HERNIA REPAIR     right  . LAPAROTOMY N/A 06/18/2015   Procedure: EXPLORATORY LAPAROTOMY;  Surgeon: Emelia Loron, MD;  Location: [facility] OR;  Service: General;  Laterality: N/A;  . LAPAROTOMY N/A 06/20/2015   Procedure: WASHOUT OF ABDOMEN, CLOSURE OF ABDOMINAL WALL WITH PLACEMENT OF WOUND VAC AND ILLEOSTOMY ;  Surgeon: Almond Lint, MD;  Location: [facility] OR;  Service: General;  Laterality: N/A;  . TEE WITHOUT CARDIOVERSION N/A 05/22/2015   Procedure: TRANSESOPHAGEAL ECHOCARDIOGRAM (TEE);  Surgeon: Kerin Perna, MD;  Location: [facility] OR;  Service: Open Heart Surgery;  Laterality: N/A;  . TRACHEOSTOMY TUBE PLACEMENT N/A 07/03/2015   Procedure: TRACHEOSTOMY;  Surgeon: Serena Colonel, MD;  Location: [facility] OR;  Service: ENT;  Laterality: N/A;  . WISDOM TOOTH EXTRACTION      Current Medications: Current Meds  Medication Sig  . dorzolamide-timolol (COSOPT) 22.3-6.8 MG/ML ophthalmic solution Place 1 drop into the left eye 2 (two) times daily.  . finasteride (PROSCAR) 5 MG tablet Take 1 tablet by mouth daily.  . furosemide (LASIX) 40 MG tablet Take 1 tablet by mouth 2 (two) times daily.  Marland Kitchen HYDROcodone-acetaminophen (NORCO/VICODIN) 5-325 MG tablet Take 1 tablet by mouth every 6 (six) hours as needed for moderate pain.  . metoprolol succinate (TOPROL-XL) 25 MG 24 hr tablet Take 0.5 tablets by mouth daily.  . Multiple Vitamins-Minerals (DECUBI-VITE PO) Take 1 tablet by mouth daily.  . potassium chloride SA (K-DUR,KLOR-CON) 20 MEQ tablet Take 20 mEq by mouth. 2 tablets in the morning and 1 and 1/2 tablets in the evening  . warfarin (COUMADIN) 5 MG tablet Take 1 tablet (5 mg total) by mouth daily at 6 PM.     Allergies:   Unasyn [ampicillin-sulbactam sodium]; Levaquin [levofloxacin]; Penicillins; and Doxycycline   Social History   Socioeconomic History  . Marital status: Married     Spouse name: Not on file  . Number of children: Not on file  . Years of education: Not on file  . Highest education level: Not on file  Occupational History  . Not on file  Social Needs  . Financial resource strain: Not on file  . Food insecurity:    Worry: Not on file    Inability: Not on file  . Transportation needs:    Medical: Not on file    Non-medical: Not on file  Tobacco Use  . Smoking status: Never Smoker  . Smokeless tobacco: Never Used  Substance and Sexual Activity  . Alcohol use: No    Alcohol/week: 0.0 standard drinks  . Drug use: No  . Sexual activity: Not Currently  Lifestyle  . Physical activity:    Days per week: Not on file  Minutes per session: Not on file  . Stress: Not on file  Relationships  . Social connections:    Talks on phone: Not on file    Gets together: Not on file    Attends religious service: Not on file    Active member of club or organization: Not on file    Attends meetings of clubs or organizations: Not on file    Relationship status: Not on file  Other Topics Concern  . Not on file  Social History Narrative  . Not on file     Family History: The patient's family history includes Alzheimer's disease in his father; Rheum arthritis in his mother. ROS:   Please see the history of present illness.    All 14 point review of systems negative except as described per history of present illness  EKGs/Labs/Other Studies Reviewed:      Recent Labs: No results found for requested labs within last 8760 hours.  Recent Lipid Panel    Component Value Date/Time   CHOL 121 05/20/2015 0110   TRIG 137 06/30/2015 2018   HDL 40 (L) 05/20/2015 0110   CHOLHDL 3.0 05/20/2015 0110   VLDL 12 05/20/2015 0110   LDLCALC 69 05/20/2015 0110    Physical Exam:    VS:  BP 112/68   Pulse (!) 55   Ht  (1.753 m)   Wt 279 lb 9.6 oz (126.8 kg)   SpO2 97%   BMI 41.29 kg/m     Wt Readings from Last 3 Encounters:  01/10/19 279 lb 9.6 oz  (126.8 kg)  11/22/18 280 lb 12.8 oz (127.4 kg)  11/23/15 242 lb 4.6 oz (109.9 kg)     GEN:  Well nourished, well developed in no acute distress HEENT: Normal NECK: No JVD; No carotid bruits LYMPHATICS: No lymphadenopathy CARDIAC: RRR, no murmurs, no rubs, no gallops RESPIRATORY:  Clear to auscultation without rales, wheezing or rhonchi  ABDOMEN: Soft, non-tender, non-distended MUSCULOSKELETAL:  No edema; No deformity  SKIN: Warm and dry LOWER EXTREMITIES: no swelling NEUROLOGIC:  Alert and oriented x 3 PSYCHIATRIC:  Normal affect   ASSESSMENT:    1. Ischemic cardiomyopathy   2. Coronary artery disease involving native coronary artery of native heart without angina pectoris   3. Essential hypertension   4. S/P CABG x 3    PLAN:    In order of problems listed above:  1. Ischemic cardiomyopathy not on optimal medications will try to start those medications. 2. Dyslipidemia I do not know his fasting lipid profile is.  We will try to get a copy from primary care physician 3. Central hypertension blood pressure well controlled continue present management 4. Coronary artery disease status post coronary bypass graft stable from that point of view.   Medication Adjustments/Labs and Tests Ordered: Current medicines are reviewed at length with the patient today.  Concerns regarding medicines are outlined above.  No orders of the defined types were placed in this encounter.  Medication changes: No orders of the defined types were placed in this encounter.   Signed, Georgeanna Lea, MD, [facility] 01/10/2019 2:00 PM    [facility]

## 2019-01-10 NOTE — Telephone Encounter (Signed)
Dr. Kirtland Bouchard prescribed entresto 24/26 BID and patient to follow up with [facility] on 01/14/19. Left vm for patient to call back to discuss.

## 2019-01-16 ENCOUNTER — Other Ambulatory Visit: Payer: Medicare Other

## 2019-01-21 ENCOUNTER — Telehealth: Payer: Self-pay | Admitting: Cardiology

## 2019-01-21 MED ORDER — FUROSEMIDE 40 MG PO TABS: 40.0000 mg | ORAL_TABLET | Freq: Two times a day (BID) | ORAL | 1 refills | Status: AC

## 2019-01-21 MED ORDER — METOPROLOL SUCCINATE ER 25 MG PO TB24: 12.5000 mg | ORAL_TABLET | Freq: Every day | ORAL | 1 refills | Status: AC

## 2019-01-21 MED ORDER — POTASSIUM CHLORIDE CRYS ER 20 MEQ PO TBCR: 20.0000 meq | EXTENDED_RELEASE_TABLET | Freq: Two times a day (BID) | ORAL | 0 refills | Status: DC

## 2019-01-21 NOTE — Telephone Encounter (Signed)
Informed patient that all his medications have been refilled at [facility] as requested.

## 2019-01-21 NOTE — Telephone Encounter (Signed)
Patient called stating he needsrefills on three of his meds and would like a 90 supply for each. He needs his potassium, Lasix, and Metoprolol.  He uses [facility] in [place].  Patient would also like a call back to know when this has been done

## 2019-02-07 ENCOUNTER — Telehealth: Payer: Self-pay | Admitting: Emergency Medicine

## 2019-02-07 NOTE — Telephone Encounter (Signed)
Left message for Jonathan Le to return call to confirm televisit tomorrow. Left on voicemail for patient to call back and not to come to the office as we are switching his visit to a televisit.

## 2019-02-08 ENCOUNTER — Other Ambulatory Visit: Payer: Self-pay

## 2019-02-08 ENCOUNTER — Ambulatory Visit: Payer: Medicare Other | Admitting: Cardiology

## 2019-02-08 ENCOUNTER — Telehealth: Payer: Self-pay | Admitting: Emergency Medicine

## 2019-02-08 ENCOUNTER — Encounter: Payer: Self-pay | Admitting: Cardiology

## 2019-02-08 ENCOUNTER — Telehealth (INDEPENDENT_AMBULATORY_CARE_PROVIDER_SITE_OTHER): Payer: Medicare Other | Admitting: Cardiology

## 2019-02-08 VITALS — BP 104/66 | HR 68 | Temp 96.7°F

## 2019-02-08 DIAGNOSIS — I252 Old myocardial infarction: Secondary | ICD-10-CM | POA: Diagnosis not present

## 2019-02-08 DIAGNOSIS — J9610 Chronic respiratory failure, unspecified whether with hypoxia or hypercapnia: Secondary | ICD-10-CM

## 2019-02-08 DIAGNOSIS — I255 Ischemic cardiomyopathy: Secondary | ICD-10-CM

## 2019-02-08 DIAGNOSIS — N401 Enlarged prostate with lower urinary tract symptoms: Secondary | ICD-10-CM

## 2019-02-08 DIAGNOSIS — I10 Essential (primary) hypertension: Secondary | ICD-10-CM

## 2019-02-08 DIAGNOSIS — N138 Other obstructive and reflux uropathy: Secondary | ICD-10-CM

## 2019-02-08 NOTE — Patient Instructions (Signed)
Medication Instructions:  Your physician recommends that you continue on your current medications as directed. Please refer to the Current Medication list given to you today.  If you need a refill on your cardiac medications before your next appointment, please call your pharmacy.   Lab work: None.  If you have labs (blood work) drawn today and your tests are completely normal, you will receive your results only by: . MyChart Message (if you have MyChart) OR . A paper copy in the mail If you have any lab test that is abnormal or we need to change your treatment, we will call you to review the results.  Testing/Procedures: None.   Follow-Up: At [facility], you and your health needs are our priority.  As part of our continuing mission to provide you with exceptional heart care, we have created designated Provider Care Teams.  These Care Teams include your primary Cardiologist (physician) and Advanced Practice Providers (APPs -  Physician Assistants and Nurse Practitioners) who all work together to provide you with the care you need, when you need it. You will need a follow up appointment in 1 months.  Please call our office 2 months in advance to schedule this appointment.  You may see No primary care provider on file. or another member of our [facility] Provider Team in [place]: Brian Munley, MD . Rajan Revankar, MD  Any Other Special Instructions Will Be Listed Below (If Applicable).     

## 2019-02-08 NOTE — Progress Notes (Signed)
Virtual Visit via Video Note    Evaluation Performed:  Follow-up visit  This visit type was conducted due to national recommendations for restrictions regarding the COVID-19 Pandemic (e.g. social distancing).  This format is felt to be most appropriate for this patient at this time.  All issues noted in this document were discussed and addressed.  No physical exam was performed (except for noted visual exam findings with Video Visits).  Please refer to the patient's chart (MyChart message for video visits and phone note for telephone visits) for the patient's consent to telehealth for [facility].  Date:  02/08/2019  ID: Jonathan Le, DOB 08-25-1946, MRN [id number]   Patient Location:  [address] [place] [address]   Provider location:   [facility] [place] [facility]  PCP:  Jonathan Palau, FNP  Cardiologist:  Jonathan Balsam, Le     Chief Complaint: Doing better  History of Present Illness:    Jonathan Le is a 73 y.o. male  who presents via audio/video conferencing for a telehealth visit today.  With ischemic cardiomyopathy significantly diminished left ventricular ejection fraction.  There is also noncompliance.  He stopped a lot of medication gradually trying to put him back on this medication the purpose of the visit was today to accomplish that however we talked about potentially starting Sherryll Burger he is scared of this medication he is worried about his side effects on top of that now with coronary virus situation we may have difficulty doing his blood work.  Therefore we will simply continue present management.  We will have another visit in about a month and we did discuss those issues.   The patient does not symptoms concerning for COVID-19 infection (fever, chills, cough, or new SHORTNESS OF BREATH).    Prior CV studies:   The following studies were reviewed today:       Past Medical History:  Diagnosis Date  . Acute deep vein  thrombosis (DVT) of lower extremity (HCC) 02/02/2016  . Adult situational stress disorder   . Anemia of chronic disease 10/16/2015  . Anxiety   . Arthritis   . Benign prostatic hyperplasia with urinary obstruction 04/24/2015  . CAD (coronary artery disease), native coronary artery    CABG with LIMA to LAD, SVG to OM, SVG to RCA 05/22/15 Jonathan. Alla Le  Cath July 16 normal LM, 100% LAD, 95% distal RCA, 90% OM2     . CHF (congestive heart failure) (HCC)    MI, with open heart surgery x 30 sdays ago per pt. (06/26/15)  . Chronic combined systolic and diastolic congestive heart failure (HCC) 09/25/2015  . Chronic LBP 12/05/2013   Overview:  M Hilts, Le/Controlled substance agreement on chart   . Chronic respiratory failure (HCC) 09/17/2015  . Decubital ulcer   . Degenerative arthritis of knee, bilateral 12/05/2013   Overview:  Jonathan. Prince Le  . DVT (deep venous thrombosis) (HCC)   . ED (erectile dysfunction)   . Essential hypertension 09/04/2013  . ETD (eustachian tube dysfunction)   . Ganglion cyst of wrist 03/06/2014  . Gastroesophageal reflux disease with esophagitis 02/02/2016  . GERD (gastroesophageal reflux disease)   . Glaucoma   . History of DVT (deep vein thrombosis)   . Hypertension   . Ileostomy in place [facility])   . Impaired fasting glucose 12/04/2013  . Ischemic cardiomyopathy   . Long-term (current) use of anticoagulants   . MI (myocardial infarction) (HCC)   . Morbid obesity (HCC) 05/20/2015  .  Normal prostate specific antigen (PSA) level 03/14/2014   Overview:  PSA 02/2014 3.7         07/2013   3.9         11/2007 3.14  . Old anterior myocardial infarction   . Osteoarthritis   . Parastomal hernia without obstruction or gangrene 06/14/2018  . Physical deconditioning 02/02/2016  . Right shoulder tendonitis 08/29/2016  . 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 Sleep apnea    wears CPAP nightly  . Spondylosis 12/05/2013    Past Surgical History:  Procedure Laterality Date  . CARDIAC CATHETERIZATION N/A  05/20/2015   Procedure: Left Heart Cath and Coronary Angiography;  Surgeon: Kathleene Hazel, Le;  Location: [facility] INVASIVE CV LAB;  Service: Cardiovascular;  Laterality: N/A;  . CORONARY ARTERY BYPASS GRAFT N/A 05/22/2015   Procedure: CORONARY ARTERY BYPASS GRAFTING (CABG), ON PUMP, TIMES THREE, USING LEFT INTERNAL MAMMARY ARTERY, RIGHT GREATER SAPHENOUS VEIN HARVESTED ENDOSCOPICALLY;  Surgeon: Jonathan Perna, Le;  Location: [facility] OR;  Service: Open Heart Surgery;  Laterality: N/A;  LIMA-LAD, SVG-OM, SVG-PD  . EYE SURGERY     bil cataract  . FASCIOTOMY FOOT / TOE Left    foot  . HERNIA REPAIR     right  . LAPAROTOMY N/A 06/18/2015   Procedure: EXPLORATORY LAPAROTOMY;  Surgeon: Jonathan Loron, Le;  Location: [facility] OR;  Service: General;  Laterality: N/A;  . LAPAROTOMY N/A 06/20/2015   Procedure: WASHOUT OF ABDOMEN, CLOSURE OF ABDOMINAL WALL WITH PLACEMENT OF WOUND VAC AND ILLEOSTOMY ;  Surgeon: Jonathan Lint, Le;  Location: [facility] OR;  Service: General;  Laterality: N/A;  . TEE WITHOUT CARDIOVERSION N/A 05/22/2015   Procedure: TRANSESOPHAGEAL ECHOCARDIOGRAM (TEE);  Surgeon: Jonathan Perna, Le;  Location: [facility] OR;  Service: Open Heart Surgery;  Laterality: N/A;  . TRACHEOSTOMY TUBE PLACEMENT N/A 07/03/2015   Procedure: TRACHEOSTOMY;  Surgeon: Jonathan Colonel, Le;  Location[facility] OR;  Service: ENT;  Laterality: N/A;  . WISDOM TOOTH EXTRACTION       Current Meds  Medication Sig  . cephALEXin (KEFLEX) 500 MG capsule Take 1 capsule by mouth.  . dorzolamide-timolol (COSOPT) 22.3-6.8 MG/ML ophthalmic solution Place 1 drop into the left eye 2 (two) times daily.  . finasteride (PROSCAR) 5 MG tablet Take 1 tablet by mouth daily.  . furosemide (LASIX) 40 MG tablet Take 1 tablet (40 mg total) by mouth 2 (two) times daily.  Marland Kitchen HYDROcodone-acetaminophen (NORCO/VICODIN) 5-325 MG tablet Take 1 tablet by mouth every 6 (six) hours as needed for moderate pain.  . metoprolol succinate (TOPROL-XL) 25 MG 24 hr tablet Take 0.5  tablets (12.5 mg total) by mouth daily.  . Multiple Vitamins-Minerals (DECUBI-VITE PO) Take 1 tablet by mouth daily.  . potassium chloride SA (K-DUR,KLOR-CON) 20 MEQ tablet Take 1 tablet (20 mEq total) by mouth 2 (two) times daily. 2 tablets in the morning and 1 and 1/2 tablets in the evening  . warfarin (COUMADIN) 5 MG tablet Take 1 tablet (5 mg total) by mouth daily at 6 PM.   Current Facility-Administered Medications for the 02/08/19 encounter (Telemedicine) with Georgeanna Lea, Le  Medication  . sacubitril-valsartan (ENTRESTO) 24-26 mg per tablet      Family History: The patient's family history includes Alzheimer's disease in his father; Rheum arthritis in his mother.   ROS:   Please see the history of present illness.     All other systems reviewed and are negative.   Labs/Other Tests and Data Reviewed:  Recent Labs: No results found for requested labs within last 8760 hours.  Recent Lipid Panel    Component Value Date/Time   CHOL 121 05/20/2015 0110   TRIG 137 06/30/2015 2018   HDL 40 (L) 05/20/2015 0110   CHOLHDL 3.0 05/20/2015 0110   VLDL 12 05/20/2015 0110   LDLCALC 69 05/20/2015 0110      Exam:    Vital Signs:  Wt 141 lb (64 kg)   BMI 24.20 kg/m    Well nourished, well developed male in no acute distress. There is no significant swelling.  Diagnosis for this visit:   1. Ischemic cardiomyopathy ejection fraction 25 to 30% by echocardiogram done in January 2020   2. Old anterior myocardial infarction   3. Essential hypertension   4. Chronic respiratory failure, unspecified whether with hypoxia or hypercapnia (HCC)   5. Benign prostatic hyperplasia with urinary obstruction      ASSESSMENT & PLAN:    1.  Ischemic cardiomyopathy plan as outlined above hopefully will be able to put him on right medication soon.  Now with coronavirus situation he have no ability to check his blood test therefore will delay starting him with Entresto on top of  that he is not willing to do that. 2.  Old myocardial infarction.  Ejection fraction still diminished. 3.  Essential hypertension blood pressure well controlled. 4.  Chronic respiratory failure.  Appears to be stable. 5.  Chronic status of his legs with some cellulitis treated with antibiotic with good improvement.  COVID-19 Education: The signs and symptoms of COVID-19 were discussed with the patient and how to seek care for testing (follow up with PCP or arrange E-visit).  The importance of social distancing was discussed today.  Patient Risk:   After full review of this patients clinical status, I feel that they are at least moderate risk at this time.  Time:   Today, I have spent 12 minutes with the patient with telehealth technology discussing pt health issues.     Medication Adjustments/Labs and Tests Ordered: Current medicines are reviewed at length with the patient today.  Concerns regarding medicines are outlined above.  No orders of the defined types were placed in this encounter.  Medication changes: No orders of the defined types were placed in this encounter.    Disposition: Follow-up in 1 month  Signed, Georgeanna Lea, Le, [facility] 02/08/2019 4:53 PM    [facility]

## 2019-02-08 NOTE — Telephone Encounter (Signed)
Left message for patient to return call to schedule 1 month televisit follow up.

## 2019-03-11 ENCOUNTER — Telehealth: Payer: Self-pay | Admitting: *Deleted

## 2019-03-11 NOTE — Telephone Encounter (Signed)
Calling patient today to discuss  upcoming appointment.  We are currently trying to limit exposure to the virus that causes COVID-19 by seeing patients at home rather than in the office. We would like to schedule this appointment as a Retail banker. Patient is aware if they decide to reschedule this appointment, they may not be seen or scheduled for the next 4-6 months. Patient at this time declines Virtual Visits. Patient declines this option of cardiac treatment at this time and will call back at a later date to discuss possible ICD. Message sent scheduling and nurse.

## 2019-03-15 ENCOUNTER — Other Ambulatory Visit: Payer: Self-pay

## 2019-03-15 ENCOUNTER — Telehealth: Payer: Medicare Other | Admitting: Cardiology

## 2019-03-18 ENCOUNTER — Institutional Professional Consult (permissible substitution): Payer: Medicare Other | Admitting: Cardiology

## 2019-03-21 ENCOUNTER — Telehealth: Payer: Medicare Other | Admitting: Cardiology

## 2019-03-28 ENCOUNTER — Other Ambulatory Visit: Payer: Self-pay | Admitting: Cardiology

## 2019-05-01 ENCOUNTER — Telehealth: Payer: Self-pay | Admitting: Internal Medicine

## 2019-05-01 NOTE — Telephone Encounter (Signed)
COVID-19 Pre-Screening Questions: ° °Do you currently have a fever (>100 °F), chills or unexplained body aches?no  ° °Are you currently experiencing new cough, shortness of breath, sore throat, runny nose?no  °•  °Have you recently travelled outside the state of [place] in the last 14 days? No  °•  °1. Have you been in contact with someone that is currently pending confirmation of Covid19 testing or has been confirmed to have the Covid19 virus?  No  ° °

## 2019-05-02 ENCOUNTER — Ambulatory Visit: Payer: Medicare Other | Admitting: Internal Medicine

## 2019-05-07 ENCOUNTER — Ambulatory Visit (INDEPENDENT_AMBULATORY_CARE_PROVIDER_SITE_OTHER): Payer: Medicare Other | Admitting: Internal Medicine

## 2019-05-07 ENCOUNTER — Other Ambulatory Visit: Payer: Self-pay

## 2019-05-07 ENCOUNTER — Encounter: Payer: Self-pay | Admitting: Internal Medicine

## 2019-05-07 DIAGNOSIS — I255 Ischemic cardiomyopathy: Secondary | ICD-10-CM

## 2019-05-07 DIAGNOSIS — L97911 Non-pressure chronic ulcer of unspecified part of right lower leg limited to breakdown of skin: Secondary | ICD-10-CM | POA: Insufficient documentation

## 2019-05-07 NOTE — Progress Notes (Signed)
[place] for Infectious Disease      Reason for Consult: positive ulcer swab    Referring Physician: Dr. Meda Coffee    Patient ID: Jonathan Le, male    DOB: March 01, 1946, 73 y.o.   MRN: [id number]  HPI:   Sent here for evaluation of multiple cultures with MSSA He has a right lower extremity ulcer that has been present for about 18 months and is being followed by wound care in [place] since the Fall approximately.  His wounds by his report have been slow but progressing forward.  He has been on multiple rounds of antibiotics including oral antibiotics as many pill treatments.  He just completed Bactrim for 14 days.  He tells me he has not had any purulent drainage, surrounding cellulitis or signs of infection.  He reports the wound swabs are done regularly and have grown bacteria each time.  Previous record reviewed of multiple swab cultures.   Past Medical History:  Diagnosis Date  . Acute deep vein thrombosis (DVT) of lower extremity ([place]) 02/02/2016  . Adult situational stress disorder   . Anemia of chronic disease 10/16/2015  . Anxiety   . Arthritis   . Benign prostatic hyperplasia with urinary obstruction 04/24/2015  . CAD (coronary artery disease), native coronary artery    CABG with LIMA to LAD, SVG to OM, SVG to RCA 05/22/15 Dr. Lawson Fiscal  Cath July 16 normal LM, 100% LAD, 95% distal RCA, 90% OM2     . CHF (congestive heart failure) (HCC)    MI, with open heart surgery x 30 sdays ago per pt. (06/26/15)  . Chronic combined systolic and diastolic congestive heart failure ([place]) 09/25/2015  . Chronic LBP 12/05/2013   Overview:  M Hilts, MD/Controlled substance agreement on chart   . Chronic respiratory failure ([place]) 09/17/2015  . Decubital ulcer   . Degenerative arthritis of knee, bilateral 12/05/2013   Overview:  Dr. Junius Roads  . DVT (deep venous thrombosis) ([place])   . ED (erectile dysfunction)   . Essential hypertension 09/04/2013  . ETD (eustachian tube dysfunction)    . Ganglion cyst of wrist 03/06/2014  . Gastroesophageal reflux disease with esophagitis 02/02/2016  . GERD (gastroesophageal reflux disease)   . Glaucoma   . History of DVT (deep vein thrombosis)   . Hypertension   . Ileostomy in place [facility])   . Impaired fasting glucose 12/04/2013  . Ischemic cardiomyopathy   . Long-term (current) use of anticoagulants   . MI (myocardial infarction) ([place])   . Morbid obesity ([place]) 05/20/2015  . Normal prostate specific antigen (PSA) level 03/14/2014   Overview:  PSA 02/2014 3.7         07/2013   3.9         11/2007 3.14  . Old anterior myocardial infarction   . Osteoarthritis   . Parastomal hernia without obstruction or gangrene 06/14/2018  . Physical deconditioning 02/02/2016  . Right shoulder tendonitis 08/29/2016  . 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 Sleep apnea    wears CPAP nightly  . Spondylosis 12/05/2013    Prior to Admission medications   Medication Sig Start Date End Date Taking? Authorizing Provider  cephALEXin (KEFLEX) 500 MG capsule Take 1 capsule by mouth. 01/30/19   [provider]  dorzolamide-timolol (COSOPT) 22.3-6.8 MG/ML ophthalmic solution Place 1 drop into the left eye 2 (two) times daily. 11/23/17   [provider]  finasteride (PROSCAR) 5 MG tablet Take 1 tablet by mouth daily. 09/27/17  [provider]  furosemide (LASIX) 40 MG tablet Take 1 tablet (40 mg total) by mouth 2 (two) times daily. 01/21/19   Georgeanna LeaKrasowski,  J, MD  HYDROcodone-acetaminophen (NORCO/VICODIN) 5-325 MG tablet Take 1 tablet by mouth every 6 (six) hours as needed for moderate pain. 11/23/15   Esperanza SheetsBuriev, Ulugbek N, MD  metoprolol succinate (TOPROL-XL) 25 MG 24 hr tablet Take 0.5 tablets (12.5 mg total) by mouth daily. 01/21/19   Georgeanna LeaKrasowski,  J, MD  Multiple Vitamins-Minerals (DECUBI-VITE PO) Take 1 tablet by mouth daily.    [provider]  potassium chloride SA (K-DUR) 20 MEQ tablet TAKE 2 TABLETS BY MOUTH IN THE MORNING AND TAKE 1&1/2  TABLETS IN THE EVENING 03/28/19   Georgeanna LeaKrasowski,  J, MD  warfarin (COUMADIN) 5 MG tablet Take 1 tablet (5 mg total) by mouth daily at 6 PM. 09/24/15   Love, Evlyn KannerPamela S, PA-C    Allergies  Allergen Reactions  . Unasyn [Ampicillin-Sulbactam Sodium] Hives, Itching and Rash    Diffuse rash not responding to Pepcid and Benadryl.  TDD.  Marland Kitchen. Levaquin [Levofloxacin] Nausea Only  . Penicillins Hives    MAR  . Doxycycline Rash    Social History   Tobacco Use  . Smoking status: Never Smoker  . Smokeless tobacco: Never Used  Substance Use Topics  . Alcohol use: No    Alcohol/week: 0.0 standard drinks  . Drug use: No    Family History  Problem Relation Age of Onset  . Alzheimer's disease Father   . Rheum arthritis Mother     Review of Systems  Constitutional: negative for fevers and chills Gastrointestinal: negative for diarrhea Integument/breast: negative for rash All other systems reviewed and are negative    Constitutional: in no apparent distress  Vitals:   05/07/19 1455  Weight: 281 lb (127.5 kg)  Height: 5\' 9"  (1.753 m)   EYES: anicteric Musculoskeletal: + pedal edema ; right leg with two ulcers, no surrounding erythema, no pus Skin: negatives: no rash Neuro: non focal Psych: normal affect, pleasant GI: obese LAD: no cervical lad  Labs: Lab Results  Component Value Date   WBC 4.5 11/23/2015   HGB 10.5 (L) 11/23/2015   HCT 33.9 (L) 11/23/2015   MCV 91.6 11/23/2015   PLT 235 11/23/2015    Lab Results  Component Value Date   CREATININE 0.37 (L) 11/23/2015   BUN 7 11/23/2015   NA 141 11/23/2015   K 3.5 11/23/2015   CL 108 11/23/2015   CO2 28 11/23/2015    Lab Results  Component Value Date   ALT 12 (L) 11/20/2015   AST 21 11/20/2015   ALKPHOS 62 11/20/2015   BILITOT 0.6 11/20/2015   INR 2.11 (H) 02/24/2016     Assessment: colonized ulcer.  I discussed the findings with him and I do not elicit any history of infection, only positive wound swabs.   Significance of swabs and treatment of swabs in non-infected ulcers unclear, no evidence that treatment improves outcomes and therefore I recommend that no swab be obtained without an active infection and antibiotics only be given in cases of true infection.     Plan: 1) continue wound care efforts Follow up here as needed.  Thanks for the consultation

## 2019-05-30 ENCOUNTER — Telehealth: Payer: Self-pay | Admitting: Cardiology
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
## 2019-06-15 DEATH — deceased
